# Patient Record
Sex: Male | Born: 1960
Health system: Southern US, Community
[De-identification: ages and names within clinical notes are randomized; demographics above are authoritative.]

## PROBLEM LIST (undated history)

## (undated) ENCOUNTER — Emergency Department (HOSPITAL_COMMUNITY): Discharge status: Absent without leave | Payer: Medicaid Other

## (undated) DIAGNOSIS — I10 Essential (primary) hypertension: Secondary | ICD-10-CM

## (undated) DIAGNOSIS — N189 Chronic kidney disease, unspecified: Secondary | ICD-10-CM

## (undated) DIAGNOSIS — M549 Dorsalgia, unspecified: Secondary | ICD-10-CM

## (undated) DIAGNOSIS — N186 End stage renal disease: Secondary | ICD-10-CM

## (undated) DIAGNOSIS — E119 Type 2 diabetes mellitus without complications: Secondary | ICD-10-CM

## (undated) DIAGNOSIS — T884XXA Failed or difficult intubation, initial encounter: Secondary | ICD-10-CM

## (undated) DIAGNOSIS — I2510 Atherosclerotic heart disease of native coronary artery without angina pectoris: Secondary | ICD-10-CM

## (undated) DIAGNOSIS — R509 Fever, unspecified: Secondary | ICD-10-CM

## (undated) DIAGNOSIS — H409 Unspecified glaucoma: Secondary | ICD-10-CM

## (undated) DIAGNOSIS — I639 Cerebral infarction, unspecified: Secondary | ICD-10-CM

## (undated) DIAGNOSIS — Z992 Dependence on renal dialysis: Secondary | ICD-10-CM

## (undated) HISTORY — DX: Essential (primary) hypertension: I10

## (undated) HISTORY — DX: Type 2 diabetes mellitus without complications: E11.9

## (undated) HISTORY — PX: EYE SURGERY: SHX253

---

## 2000-08-14 ENCOUNTER — Encounter: Payer: Self-pay | Admitting: Emergency Medicine

## 2000-08-14 ENCOUNTER — Emergency Department (HOSPITAL_COMMUNITY): Admission: EM | Admit: 2000-08-14 | Discharge: 2000-08-14 | Payer: Self-pay | Admitting: Emergency Medicine

## 2004-03-05 ENCOUNTER — Ambulatory Visit: Payer: Self-pay | Admitting: Family Medicine

## 2004-03-29 ENCOUNTER — Ambulatory Visit (HOSPITAL_COMMUNITY): Admission: RE | Admit: 2004-03-29 | Discharge: 2004-03-29 | Payer: Self-pay | Admitting: General Practice

## 2004-06-07 ENCOUNTER — Ambulatory Visit: Payer: Self-pay | Admitting: Family Medicine

## 2004-08-01 ENCOUNTER — Ambulatory Visit: Payer: Self-pay | Admitting: Family Medicine

## 2004-09-04 ENCOUNTER — Ambulatory Visit: Payer: Self-pay | Admitting: Family Medicine

## 2005-03-15 ENCOUNTER — Ambulatory Visit: Payer: Self-pay | Admitting: Family Medicine

## 2005-04-18 ENCOUNTER — Ambulatory Visit: Payer: Self-pay | Admitting: *Deleted

## 2005-05-01 ENCOUNTER — Ambulatory Visit: Payer: Self-pay | Admitting: Family Medicine

## 2005-06-14 ENCOUNTER — Ambulatory Visit: Payer: Self-pay | Admitting: Internal Medicine

## 2008-02-01 ENCOUNTER — Encounter (INDEPENDENT_AMBULATORY_CARE_PROVIDER_SITE_OTHER): Payer: Self-pay | Admitting: Family Medicine

## 2008-02-01 ENCOUNTER — Ambulatory Visit: Payer: Self-pay | Admitting: Internal Medicine

## 2008-02-01 LAB — CONVERTED CEMR LAB
ALT: 13 units/L (ref 0–53)
AST: 15 units/L (ref 0–37)
Albumin: 4.4 g/dL (ref 3.5–5.2)
Alkaline Phosphatase: 63 units/L (ref 39–117)
BUN: 8 mg/dL (ref 6–23)
CO2: 24 meq/L (ref 19–32)
Calcium: 9.3 mg/dL (ref 8.4–10.5)
Chloride: 104 meq/L (ref 96–112)
Cholesterol: 144 mg/dL (ref 0–200)
Creatinine, Ser: 0.88 mg/dL (ref 0.40–1.50)
Glucose, Bld: 104 mg/dL — ABNORMAL HIGH (ref 70–99)
HDL: 42 mg/dL (ref >39)
LDL Cholesterol: 84 mg/dL (ref 0–99)
Microalb, Ur: 7.03 mg/dL — ABNORMAL HIGH (ref 0.00–1.89)
PSA: 0.21 ng/mL (ref 0.10–4.00)
Potassium: 5 meq/L (ref 3.5–5.3)
Sodium: 139 meq/L (ref 135–145)
TSH: 1.141 microintl units/mL (ref 0.350–4.50)
Total Bilirubin: 0.5 mg/dL (ref 0.3–1.2)
Total CHOL/HDL Ratio: 3.4
Total Protein: 6.9 g/dL (ref 6.0–8.3)
Triglycerides: 91 mg/dL (ref <150)
VLDL: 18 mg/dL (ref 0–40)

## 2008-02-16 ENCOUNTER — Ambulatory Visit: Payer: Self-pay | Admitting: Internal Medicine

## 2008-05-23 ENCOUNTER — Ambulatory Visit: Payer: Self-pay | Admitting: Internal Medicine

## 2008-10-12 ENCOUNTER — Ambulatory Visit: Payer: Self-pay | Admitting: Internal Medicine

## 2008-10-18 ENCOUNTER — Emergency Department (HOSPITAL_COMMUNITY): Admission: EM | Admit: 2008-10-18 | Discharge: 2008-10-18 | Payer: Self-pay | Admitting: Emergency Medicine

## 2008-10-18 ENCOUNTER — Ambulatory Visit: Payer: Self-pay | Admitting: Internal Medicine

## 2010-08-27 LAB — BASIC METABOLIC PANEL
BUN: 26 mg/dL — ABNORMAL HIGH (ref 6–23)
CO2: 26 mEq/L (ref 19–32)
Calcium: 9.1 mg/dL (ref 8.4–10.5)
Chloride: 105 mEq/L (ref 96–112)
Creatinine, Ser: 1.02 mg/dL (ref 0.4–1.5)
GFR calc Af Amer: >60 mL/min (ref >60)
GFR calc non Af Amer: >60 mL/min (ref >60)
Glucose, Bld: 431 mg/dL — ABNORMAL HIGH (ref 70–99)
Potassium: 5 mEq/L (ref 3.5–5.1)
Sodium: 136 mEq/L (ref 135–145)

## 2010-08-27 LAB — GLUCOSE, CAPILLARY
Glucose-Capillary: 190 mg/dL — ABNORMAL HIGH (ref 70–99)
Glucose-Capillary: 436 mg/dL — ABNORMAL HIGH (ref 70–99)
Glucose-Capillary: 437 mg/dL — ABNORMAL HIGH (ref 70–99)

## 2010-08-27 LAB — DIFFERENTIAL
Basophils Absolute: 0 10*3/uL (ref 0.0–0.1)
Lymphocytes Relative: 32 % (ref 12–46)
Monocytes Absolute: 0.4 10*3/uL (ref 0.1–1.0)
Neutro Abs: 2.1 10*3/uL (ref 1.7–7.7)

## 2010-08-27 LAB — CBC
HCT: 32.2 % — ABNORMAL LOW (ref 39.0–52.0)
Hemoglobin: 11.5 g/dL — ABNORMAL LOW (ref 13.0–17.0)
MCHC: 35.8 g/dL (ref 30.0–36.0)
MCV: 85.6 fL (ref 78.0–100.0)
Platelets: 127 10*3/uL — ABNORMAL LOW (ref 150–400)
RBC: 3.76 MIL/uL — ABNORMAL LOW (ref 4.22–5.81)
RDW: 13 % (ref 11.5–15.5)
WBC: 3.9 10*3/uL — ABNORMAL LOW (ref 4.0–10.5)

## 2010-08-27 LAB — URINALYSIS, ROUTINE W REFLEX MICROSCOPIC
Bilirubin Urine: NEGATIVE
Leukocytes, UA: NEGATIVE
Nitrite: NEGATIVE
Specific Gravity, Urine: 1.031 — ABNORMAL HIGH (ref 1.005–1.030)
pH: 5 (ref 5.0–8.0)

## 2010-08-27 LAB — URINE MICROSCOPIC-ADD ON

## 2011-05-21 ENCOUNTER — Ambulatory Visit: Payer: Self-pay

## 2011-05-21 DIAGNOSIS — I10 Essential (primary) hypertension: Secondary | ICD-10-CM

## 2011-05-21 DIAGNOSIS — IMO0001 Reserved for inherently not codable concepts without codable children: Secondary | ICD-10-CM

## 2011-06-06 ENCOUNTER — Ambulatory Visit: Payer: Self-pay

## 2011-06-06 DIAGNOSIS — E1129 Type 2 diabetes mellitus with other diabetic kidney complication: Secondary | ICD-10-CM

## 2012-06-09 ENCOUNTER — Other Ambulatory Visit: Payer: Self-pay | Admitting: Family Medicine

## 2013-02-22 ENCOUNTER — Ambulatory Visit: Payer: Self-pay | Admitting: Internal Medicine

## 2013-02-22 VITALS — BP 162/82 | HR 67 | Temp 98.6°F | Resp 14 | Ht 69.0 in | Wt 176.4 lb

## 2013-02-22 DIAGNOSIS — I10 Essential (primary) hypertension: Secondary | ICD-10-CM

## 2013-02-22 DIAGNOSIS — E1065 Type 1 diabetes mellitus with hyperglycemia: Secondary | ICD-10-CM

## 2013-02-22 LAB — POCT URINALYSIS DIPSTICK
Bilirubin, UA: NEGATIVE
Ketones, UA: NEGATIVE
pH, UA: 5

## 2013-02-22 MED ORDER — INSULIN GLARGINE 100 UNIT/ML ~~LOC~~ SOLN: 50.0000 [IU] | Freq: Every day | SUBCUTANEOUS | Status: DC

## 2013-02-22 MED ORDER — LISINOPRIL 20 MG PO TABS: 20.0000 mg | ORAL_TABLET | Freq: Every day | ORAL | Status: DC

## 2013-02-22 MED ORDER — METFORMIN HCL 1000 MG PO TABS: 500.0000 mg | ORAL_TABLET | Freq: Two times a day (BID) | ORAL | Status: DC

## 2013-02-22 NOTE — Patient Instructions (Addendum)
Take your meds as directed. Return in one month to see dr hopper for reeval of your diabetes and hypertension control. If your symptoms worsen return to the [facility].

## 2013-02-22 NOTE — Progress Notes (Signed)
Subjective:    Patient ID: Russell Price, male    DOB: [birth year], 52 y.o.   MRN: [id number]  HPI 52 year old male from the [place] with a history of htn and diabetes, insulin dependant here for medication refill. He has been out of the country for the past 3 or 4 months returned 4 weeks ago from the [place] ([place]) Denies illness or exposure to Ebola. He ran out of his medications while he was in [place] and has been getting up to urinate several times a night.He denies weight loss fever nvd cough sob.he is here requesting a refill on his meds. He does not check his sugar.   Review of Systems  Constitutional: Negative.   HENT: Negative.   Eyes: Negative.   Respiratory: Negative.   Cardiovascular: Negative.   Gastrointestinal: Negative.   Endocrine: Negative.   Genitourinary: Negative.   Musculoskeletal: Negative.   Skin: Negative.   Allergic/Immunologic: Negative.   Neurological: Negative.   Hematological: Negative.   Psychiatric/Behavioral: Negative.   All other systems reviewed and are negative.       Objective:   Physical Exam  Nursing note and vitals reviewed. Constitutional: He is oriented to person, place, and time. He appears well-developed and well-nourished. No distress.  HENT:  Head: Normocephalic and atraumatic.  Right Ear: External ear normal.  Left Ear: External ear normal.  Nose: Nose normal.  Mouth/Throat: Oropharynx is clear and moist.  Eyes: Conjunctivae and EOM are normal. Pupils are equal, round, and reactive to light.  Neck: Normal range of motion. Neck supple.  Cardiovascular: Normal rate, regular rhythm, normal heart sounds and intact distal pulses.   Pulmonary/Chest: Effort normal and breath sounds normal.  Abdominal: Soft. Bowel sounds are normal.  Musculoskeletal: Normal range of motion.  Neurological: He is alert and oriented to person, place, and time. He has normal reflexes.  Skin: Skin is warm and dry. He is not diaphoretic.   Psychiatric: He has a normal mood and affect. His behavior is normal. Judgment and thought content normal.   bp is elevated.pt is afebrile. Skin is warm and dry  Results for orders placed in visit on 02/22/13  POCT GLYCOSYLATED HEMOGLOBIN (HGB A1C)      Result Value Range   Hemoglobin A1C 11.3    GLUCOSE, POCT (MANUAL RESULT ENTRY)      Result Value Range   POC Glucose 149 (*) 70 - 99 mg/dl  POCT URINALYSIS DIPSTICK      Result Value Range   Color, UA yellow     Clarity, UA clear     Glucose, UA neg     Bilirubin, UA neg     Ketones, UA neg     Spec Grav, UA 1.025     Blood, UA small     pH, UA 5.0     Protein, UA 300     Urobilinogen, UA 0.2     Nitrite, UA neg     Leukocytes, UA Negative     Diabetes is out of control with hgb a1c of 11.3. He has not taken his insulin in at least 2 months. Will restart meds and have him return in one month for eval with his primary care physician. Dr.Hopper/.   Johnnye Lana mild dehydration. Sugar non fasting 149 Assessment & Plan:  Pt has been noncompliant with his diabetes medications and htn meds for the past several lmonths. His hgb a1c is above 11. BP is high. Will restart lmeds and reeval in one  month.

## 2013-05-29 ENCOUNTER — Ambulatory Visit (INDEPENDENT_AMBULATORY_CARE_PROVIDER_SITE_OTHER): Payer: BC Managed Care – PPO | Admitting: Family Medicine

## 2013-05-29 VITALS — BP 128/68 | HR 52 | Temp 98.9°F | Resp 18 | Ht 69.5 in | Wt 188.0 lb

## 2013-05-29 DIAGNOSIS — E108 Type 1 diabetes mellitus with unspecified complications: Secondary | ICD-10-CM

## 2013-05-29 DIAGNOSIS — IMO0002 Reserved for concepts with insufficient information to code with codable children: Secondary | ICD-10-CM

## 2013-05-29 DIAGNOSIS — E119 Type 2 diabetes mellitus without complications: Secondary | ICD-10-CM

## 2013-05-29 DIAGNOSIS — H5440 Blindness, one eye, unspecified eye: Secondary | ICD-10-CM

## 2013-05-29 DIAGNOSIS — E1065 Type 1 diabetes mellitus with hyperglycemia: Secondary | ICD-10-CM

## 2013-05-29 LAB — COMPREHENSIVE METABOLIC PANEL
ALT: 12 U/L (ref 0–53)
AST: 12 U/L (ref 0–37)
Albumin: 4.1 g/dL (ref 3.5–5.2)
Alkaline Phosphatase: 93 U/L (ref 39–117)
BUN: 36 mg/dL — ABNORMAL HIGH (ref 6–23)
CO2: 27 mEq/L (ref 19–32)
Calcium: 9.5 mg/dL (ref 8.4–10.5)
Chloride: 103 mEq/L (ref 96–112)
Creat: 2.13 mg/dL — ABNORMAL HIGH (ref 0.50–1.35)
Glucose, Bld: 211 mg/dL — ABNORMAL HIGH (ref 70–99)
Potassium: 4.6 mEq/L (ref 3.5–5.3)
Sodium: 137 mEq/L (ref 135–145)
Total Bilirubin: 0.3 mg/dL (ref 0.3–1.2)
Total Protein: 7.3 g/dL (ref 6.0–8.3)

## 2013-05-29 LAB — POCT GLYCOSYLATED HEMOGLOBIN (HGB A1C): Hemoglobin A1C: 8.1

## 2013-05-29 LAB — GLUCOSE, POCT (MANUAL RESULT ENTRY): POC Glucose: 227 mg/dl — AB (ref 70–99)

## 2013-05-29 LAB — MICROALBUMIN, URINE: Microalb, Ur: 104.12 mg/dL — ABNORMAL HIGH (ref 0.00–1.89)

## 2013-05-29 MED ORDER — INSULIN GLARGINE 100 UNIT/ML ~~LOC~~ SOLN: 50.0000 [IU] | Freq: Every day | SUBCUTANEOUS | Status: DC

## 2013-05-29 MED ORDER — METFORMIN HCL 1000 MG PO TABS
500.0000 mg | ORAL_TABLET | Freq: Two times a day (BID) | ORAL | Status: DC
Start: 2013-05-29 — End: 2013-09-14

## 2013-05-29 MED ORDER — LISINOPRIL 20 MG PO TABS: 20.0000 mg | ORAL_TABLET | Freq: Every day | ORAL | Status: DC

## 2013-05-29 NOTE — Progress Notes (Signed)
Patient ID: Russell Price MRN: [id number], DOB: 26-Sep-1960, 53 y.o. Date of Encounter: 05/29/2013, 2:32 PM  Primary Physician: No PCP Per Patient  Chief Complaint: Diabetes follow up  HPI: 53 y.o. year old male  From [place] with history below presents for follow up of diabetes mellitus since 1996. Doing well. No issues or complaints. Taking medications daily without adverse effects. No polydipsia, polyphagia, polyuria, or nocturia.  Married, no children, works at [employer] Blood sugars at home:  Not checked Diet consists of:  Food from [place] Exercising regularly.  At work at [employer] A1C: 11.3  Eye MD:  Several years  Pneumococcal vaccine:  Has rec'd one vaccine   Past Medical History  Diagnosis Date  . Diabetes mellitus without complication   . Hypertension      Home Meds: Prior to Admission medications   Medication Sig Start Date End Date Taking? Authorizing Provider  insulin glargine (LANTUS) 100 UNIT/ML injection Inject 0.5 mLs (50 Units total) into the skin at bedtime. 02/22/13  Yes Boris Lown, MD  lisinopril (PRINIVIL,ZESTRIL) 20 MG tablet Take 1 tablet (20 mg total) by mouth daily. 02/22/13  Yes Boris Lown, MD  metFORMIN (GLUCOPHAGE) 1000 MG tablet Take 0.5 tablets (500 mg total) by mouth 2 (two) times daily. 02/22/13  Yes Boris Lown, MD    Allergies: No Known Allergies  History   Social History  . Marital Status: Single    Spouse Name: N/A    Number of Children: N/A  . Years of Education: N/A   Occupational History  . Not on file.   Social History Main Topics  . Smoking status: Former Smoker    Quit date: 02/23/1983  . Smokeless tobacco: Not on file  . Alcohol Use: No  . Drug Use: No  . Sexual Activity: Not on file   Other Topics Concern  . Not on file   Social History Narrative  . No narrative on file     Review of Systems: Constitutional: negative for chills, fever, night sweats, weight changes, or fatigue  HEENT: negative  for vision changes, hearing loss, congestion, rhinorrhea, or epistaxis Cardiovascular: negative for chest pain, palpitations, diaphoresis, DOE, orthopnea, or edema Respiratory: negative for hemoptysis, wheezing, shortness of breath, dyspnea, or cough Abdominal: negative for abdominal pain, nausea, vomiting, diarrhea, or constipation Dermatological: negative for rash, erythema, or wounds Neurologic: negative for headache, dizziness, or syncope Renal:  Negative for polyuria, polydipsia, or dysuria All other systems reviewed and are otherwise negative with the exception to those above and in the HPI.   Physical Exam: Blood pressure 128/68, pulse 52, temperature 98.9 F (37.2 C), temperature source Oral, resp. rate 18, height 5' 9.5" (1.765 m), weight 188 lb (85.276 kg), SpO2 100.00%., Body mass index is 27.37 kg/(m^2). General: Well developed, well nourished, in no acute distress. Head: Normocephalic,eyes without discharge, sclera non-icteric, nares are without discharge. Bilateral auditory canals clear, TM's are without perforation, pearly grey and translucent with reflective cone of light bilaterally. Oral cavity moist, posterior pharynx without exudate, erythema, peritonsillar abscess, or post nasal drip. No red reflex right, normal retina left  Neck: Supple. No thyromegaly. Full ROM. No lymphadenopathy. Lungs: Clear bilaterally to auscultation without wheezes, rales, or rhonchi. Breathing is unlabored. Heart: RRR with S1 S2. No murmurs, rubs, or gallops appreciated. Abdomen: Soft, non-tender, non-distended with normoactive bowel sounds. No hepatosplenomegaly. No rebound/guarding. No obvious abdominal masses. Msk:  Strength and tone normal for age. Extremities/Skin: Warm and dry. No clubbing or  cyanosis. No edema. No rashes, wounds, or suspicious lesions. Monofilament exam normal.  Neuro: Alert and oriented X 3. Moves all extremities spontaneously. Gait is normal. CNII-XII grossly in  tact. Psych:  Responds to questions appropriately with a normal affect.   Labs: Results for orders placed in visit on 05/29/13  POCT GLYCOSYLATED HEMOGLOBIN (HGB A1C)      Result Value Range   Hemoglobin A1C 8.1    GLUCOSE, POCT (MANUAL RESULT ENTRY)      Result Value Range   POC Glucose 227 (*) 70 - 99 mg/dl      ASSESSMENT AND PLAN:  53 y.o. 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 Type II or unspecified type diabetes mellitus without mention of complication, not stated as uncontrolled - Plan: POCT glycosylated hemoglobin (Hb A1C), POCT glucose (manual entry), Comprehensive metabolic panel, Microalbumin, urine, Ambulatory referral to Ophthalmology  Blind right eye - Plan: Ambulatory referral to Ophthalmology  Type I (juvenile type) diabetes mellitus with unspecified complication, uncontrolled - Plan: Ambulatory referral to Ophthalmology, metFORMIN (GLUCOPHAGE) 1000 MG tablet, lisinopril (PRINIVIL,ZESTRIL) 20 MG tablet, insulin glargine (LANTUS) 100 UNIT/ML injection   -  Signed, Robyn Haber, MD 05/29/2013 2:32 PM

## 2013-05-30 ENCOUNTER — Other Ambulatory Visit: Payer: Self-pay | Admitting: Family Medicine

## 2013-05-30 DIAGNOSIS — E1121 Type 2 diabetes mellitus with diabetic nephropathy: Secondary | ICD-10-CM

## 2013-09-12 ENCOUNTER — Ambulatory Visit (INDEPENDENT_AMBULATORY_CARE_PROVIDER_SITE_OTHER): Payer: BC Managed Care – PPO | Admitting: Family Medicine

## 2013-09-12 ENCOUNTER — Telehealth: Payer: Self-pay | Admitting: Family Medicine

## 2013-09-12 VITALS — BP 128/60 | HR 60 | Temp 98.0°F | Resp 16 | Ht 69.0 in | Wt 176.8 lb

## 2013-09-12 DIAGNOSIS — IMO0002 Reserved for concepts with insufficient information to code with codable children: Secondary | ICD-10-CM

## 2013-09-12 DIAGNOSIS — E119 Type 2 diabetes mellitus without complications: Secondary | ICD-10-CM

## 2013-09-12 DIAGNOSIS — I10 Essential (primary) hypertension: Secondary | ICD-10-CM

## 2013-09-12 DIAGNOSIS — N289 Disorder of kidney and ureter, unspecified: Secondary | ICD-10-CM

## 2013-09-12 DIAGNOSIS — E118 Type 2 diabetes mellitus with unspecified complications: Principal | ICD-10-CM

## 2013-09-12 DIAGNOSIS — E1165 Type 2 diabetes mellitus with hyperglycemia: Secondary | ICD-10-CM

## 2013-09-12 LAB — COMPREHENSIVE METABOLIC PANEL
AST: 10 U/L (ref 0–37)
BUN: 39 mg/dL — ABNORMAL HIGH (ref 6–23)
Calcium: 8.9 mg/dL (ref 8.4–10.5)
Chloride: 103 mEq/L (ref 96–112)
Creat: 2.43 mg/dL — ABNORMAL HIGH (ref 0.50–1.35)
Glucose, Bld: 274 mg/dL — ABNORMAL HIGH (ref 70–99)

## 2013-09-12 LAB — COMPREHENSIVE METABOLIC PANEL WITH GFR
ALT: 9 U/L (ref 0–53)
Albumin: 3.8 g/dL (ref 3.5–5.2)
Alkaline Phosphatase: 90 U/L (ref 39–117)
CO2: 23 meq/L (ref 19–32)
Potassium: 5.2 meq/L (ref 3.5–5.3)
Sodium: 134 meq/L — ABNORMAL LOW (ref 135–145)
Total Bilirubin: 0.4 mg/dL (ref 0.2–1.2)
Total Protein: 7 g/dL (ref 6.0–8.3)

## 2013-09-12 LAB — MICROALBUMIN, URINE: Microalb, Ur: 94.89 mg/dL — ABNORMAL HIGH (ref 0.00–1.89)

## 2013-09-12 LAB — GLUCOSE, POCT (MANUAL RESULT ENTRY): POC Glucose: 280 mg/dl — AB (ref 70–99)

## 2013-09-12 LAB — POCT GLYCOSYLATED HEMOGLOBIN (HGB A1C): Hemoglobin A1C: 8.9

## 2013-09-12 MED ORDER — INSULIN GLARGINE 100 UNIT/ML ~~LOC~~ SOLN: 50.0000 [IU] | Freq: Every day | SUBCUTANEOUS | Status: DC

## 2013-09-12 NOTE — Telephone Encounter (Signed)
LM to call me so we can change medications and talk about his labs

## 2013-09-12 NOTE — Patient Instructions (Signed)
Diabetes and Standards of Medical Care  Diabetes is complicated. You may find that your diabetes team includes a dietitian, nurse, diabetes educator, eye doctor, and more. To help everyone know what is going on and to help you get the care you deserve, the following schedule of care was developed to help keep you on track. Below are the tests, exams, vaccines, medicines, education, and plans you will need. HbA1c test This test shows how well you have controlled your glucose over the past 2 3 months. It is used to see if your diabetes management plan needs to be adjusted.   It is performed at least 2 times a year if you are meeting treatment goals.  It is performed 4 times a year if therapy has changed or if you are not meeting treatment goals. Blood pressure test  This test is performed at every routine medical visit. The goal is less than 140/90 mmHg for most people, but 130/80 mmHg in some cases. Ask your health care provider about your goal. Dental exam  Follow up with the dentist regularly. Eye exam  If you are diagnosed with type 1 diabetes as a child, get an exam upon reaching the age of 42 years or older and have had diabetes for 3 5 years. Yearly eye exams are recommended after that initial eye exam.  If you are diagnosed with type 1 diabetes as an adult, get an exam within 5 years of diagnosis and then yearly.  If you are diagnosed with type 2 diabetes, get an exam as soon as possible after the diagnosis and then yearly. Foot care exam  Visual foot exams are performed at every routine medical visit. The exams check for cuts, injuries, or other problems with the feet.  A comprehensive foot exam should be done yearly. This includes visual inspection as well as assessing foot pulses and testing for loss of sensation.  Check your feet nightly for cuts, injuries, or other problems with your feet. Tell your health care provider if anything is not healing. Kidney function test (urine  microalbumin)  This test is performed once a year.  Type 1 diabetes: The first test is performed 5 years after diagnosis.  Type 2 diabetes: The first test is performed at the time of diagnosis.  A serum creatinine and estimated glomerular filtration rate (eGFR) test is done once a year to assess the level of chronic kidney disease (CKD), if present. Lipid profile (cholesterol, HDL, LDL, triglycerides)  Performed every 5 years for most people.  The goal for LDL is less than 100 mg/dL. If you are at high risk, the goal is less than 70 mg/dL.  The goal for HDL is 40 mg/dL 50 mg/dL for men and 50 mg/dL 60 mg/dL for women. An HDL cholesterol of 60 mg/dL or higher gives some protection against heart disease.  The goal for triglycerides is less than 150 mg/dL. Influenza vaccine, pneumococcal vaccine, and hepatitis B vaccine  The influenza vaccine is recommended yearly.  The pneumococcal vaccine is generally given once in a lifetime. However, there are some instances when another vaccination is recommended. Check with your health care provider.  The hepatitis B vaccine is also recommended for adults with diabetes. Diabetes self-management education  Education is recommended at diagnosis and ongoing as needed. Treatment plan  Your treatment plan is reviewed at every medical visit. Document Released: 03/03/2009 Document Revised: 01/06/2013 Document Reviewed: 10/06/2012 [facility] Patient Information 2014 [place]. Diabetes and Small Vessel Disease Small vessel disease (microvascular  disease) includes nephropathy, retinopathy, and neuropathy. People with diabetes are at risk for these problems, but keeping blood glucose (sugar) controlled is helpful in preventing problems. DIABETIC KIDNEY PROBLEMS (DIABETIC NEPHROPATHY)  Diabetic nephropathy occurs in many patients with diabetes.  Damage to the small vessels in the kidneys is the leading cause of end-stage renal disease  (ESRD).  Protein in the urine (albuminuria) in the range of 30 to 300 mg/24 h (microalbuminuria) is a sign of the earliest stage of diabetic nephropathy.  Good blood glucose (sugar) and blood pressure control significantly reduce the progression of nephropathy. DIABETIC EYE PROBLEMS (DIABETIC RETINOPATHY)  Diabetic retinopathy is the most common cause of new cases of blindness in adults. It is related to the number of years you have had diabetes.  Common risk factors include high blood sugar (hyperglycemia), high blood pressure (hypertension), and poorly controlled blood lipids such as high blood cholesterol (hypercholesterolemia). DIABETIC NERVE PROBLEMS (DIABETIC NEUROPATHY) Diabetic neuropathy is the most common, long-term complication of diabetes. It is responsible for more than half of leg amputations not due to accidents. The main risk for developing diabetic neuropathy seems to be uncontrolled blood sugars. Hyperglycemia damages the nerve fibers causing sensation (feeling) problems. The closer you can keep the following guidelines, the better chance you will have avoiding problems from small vessel disease.  Working toward near normal blood glucose or as normal as possible. You will need to keep your blood glucose and A1c at the target range prescribed by your caregiver.  Keep your blood pressure less than 120/80.  Keep your low-density lipoprotein (LDL) cholesterol (one of the fats in your blood) at less than 100 mg/dL. An LDL less than 70 mg/dL may be recommended for high risk patients. You cannot change your family history, but it is important to change the risk factors that you can. Risk factors you can control include:  Controlling high blood pressure.  Stopping smoking.  Using alcohol only in moderation. Generally, this means about one drink per day for women and two drinks per day for men.  Controlling your blood lipids (cholesterol and triglycerides).  Treating heart  problems, if these are contributing to risk. SEEK MEDICAL CARE IF:   You are having problems keeping your blood glucose in goal range.  You notice a change in your vision or new problems with your vision.  You have wound or sore that does not heal.  Your blood pressure is above the target range. Document Released: 05/09/2003 Document Revised: 04/22/2012 Document Reviewed: 10/14/2008 [facility] Patient Information 2014 [place], [place].

## 2013-09-12 NOTE — Progress Notes (Signed)
Chief Complaint:  Chief Complaint  Patient presents with  . Medication Refill    bp, and sugar    HPI: Russell Price is a 53 y.o. male who is here for  Medication refills for DM and HTN He was last here and HbA1c in 05/2013 and A1c was 8.1 He was previously seen in 02/2013 and A1c was 11.3 Diabetes-dx 1996, few episodes of hypoglycemia, last year was the last time.  He knows the hypglycemia sxs. Denies neuropathy .Last eye exam was March 2014, he has had laser suregery for diabetic retinal issues from what it sounds like He used to work at [employer], He now works at [employer] , starts work at [employer]. He has not changed diet .  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    Past Medical History  Diagnosis Date  . Diabetes mellitus without complication   . Hypertension    History reviewed. No pertinent past surgical history. History   Social History  . Marital Status: Single    Spouse Name: N/A    Number of Children: N/A  . Years of Education: N/A   Social History Main Topics  . Smoking status: Former Smoker    Quit date: 02/23/1983  . Smokeless tobacco: None  . Alcohol Use: No  . Drug Use: No  . Sexual Activity: None   Other Topics Concern  . None   Social History Narrative  . None   Family History  Problem Relation Age of Onset  . Diabetes Mother    No Known Allergies Prior to Admission medications   Medication Sig Start Date End Date Taking? Authorizing Provider  insulin glargine (LANTUS) 100 UNIT/ML injection Inject 0.5 mLs (50 Units total) into the skin at bedtime.  05/29/13  Yes Robyn Haber, MD  lisinopril (PRINIVIL,ZESTRIL) 20 MG tablet Take 1 tablet (20 mg total) by mouth daily. 05/29/13  Yes Robyn Haber, MD  metFORMIN (GLUCOPHAGE) 1000 MG tablet Take 0.5 tablets (500 mg total) by mouth 2 (two) times daily. 05/29/13  Yes Robyn Haber, MD     ROS: The patient denies fevers, chills, night sweats, unintentional weight loss, chest pain, palpitations, wheezing, dyspnea on exertion, nausea, vomiting, abdominal pain, dysuria, hematuria, melena, numbness, weakness, or tingling.   All other systems have been reviewed and were otherwise negative with the exception of those mentioned in the HPI and as above.    PHYSICAL EXAM: Filed Vitals:   09/12/13 1113  BP: 128/60  Pulse: 60  Temp: 98 F (36.7 C)  Resp: 16   Filed Vitals:   09/12/13 1113  Height: 5\' 9"  (1.753 m)  Weight: 176 lb 12.8 oz (80.196 kg)   Body mass index is 26.1 kg/(m^2).  General: Alert, no acute distress HEENT:  Normocephalic, atraumatic, oropharynx patent. EOMI, PERRLA Cardiovascular:  Regular rate and rhythm, no rubs murmurs or gallops.  No Carotid bruits, radial pulse intact. No pedal edema.  Respiratory: Clear to auscultation bilaterally.  No wheezes, rales, or rhonchi.  No cyanosis, no use of accessory  musculature GI: No organomegaly, abdomen is soft and non-tender, positive bowel sounds.  No masses. Skin: No rashes. Neurologic: Facial musculature symmetric. Psychiatric: Patient is appropriate throughout our interaction. Lymphatic: No cervical lymphadenopathy Musculoskeletal: Gait intact.   LABS: Results for orders placed in visit on 09/12/13  GLUCOSE, POCT (MANUAL RESULT ENTRY)      Result Value Ref Range   POC Glucose 280 (*) 70 - 99 mg/dl  POCT GLYCOSYLATED HEMOGLOBIN (HGB A1C)      Result Value Ref Range   Hemoglobin A1C 8.9       EKG/XRAY:   Primary read interpreted by Dr. Marin Comment at [facility].   ASSESSMENT/PLAN: Encounter Diagnoses  Name Primary?  . DM  (diabetes mellitus) Yes  . HTN (hypertension)   . Kidney function abnormal    I wil call patient with new lab results If kidney function is stable then will continue  with current meds otherwise will change metformin and lisinopril to decrease renal damage He will make appt with renal specialist He will be referred to endocrinology since he is noncompliant Fu in the morning with labs and any new medicine changes, call him before he goes to work at 2:30 PM  Gross sideeffects, risk and benefits, and alternatives of medications d/w patient. Patient is aware that all medications have potential sideeffects and we are unable to predict every sideeffect or drug-drug interaction that may occur.  [name], DO 09/12/2013 12:16 PM  4/26 and 4/27 LM to call me to change meds until can be seen by endocrinology

## 2013-09-13 ENCOUNTER — Telehealth: Payer: Self-pay | Admitting: Family Medicine

## 2013-09-13 NOTE — Telephone Encounter (Signed)
LM to all me back, will refer to [place].

## 2013-09-14 ENCOUNTER — Telehealth: Payer: Self-pay | Admitting: Family Medicine

## 2013-09-14 MED ORDER — FREESTYLE SYSTEM KIT: PACK | Status: DC

## 2013-09-14 MED ORDER — GLIPIZIDE 5 MG PO TABS: 5.0000 mg | ORAL_TABLET | Freq: Every day | ORAL | Status: DC

## 2013-09-14 MED ORDER — AMLODIPINE BESYLATE 10 MG PO TABS: 10.0000 mg | ORAL_TABLET | Freq: Every day | ORAL | Status: DC

## 2013-09-14 NOTE — Telephone Encounter (Signed)
Spoke to him about labs and need to change his meds from metformin to glipizide 5 daily and increase his lantus to 55 units daily, he will stop lisinopril and will be switched to norvasc due to increase creatinine.  He was given hypoglycemia precautions with glipizide when taken without food, also extremity edema with CCB, this is temporary until he gets in to see endocrinology ad also renal specialist. I have emphasized to him my concerns, he needs to also get glucose monitor to measure FBS.

## 2014-01-03 ENCOUNTER — Ambulatory Visit (INDEPENDENT_AMBULATORY_CARE_PROVIDER_SITE_OTHER): Payer: BC Managed Care – PPO | Admitting: Family Medicine

## 2014-01-03 ENCOUNTER — Ambulatory Visit (INDEPENDENT_AMBULATORY_CARE_PROVIDER_SITE_OTHER): Payer: BC Managed Care – PPO

## 2014-01-03 VITALS — BP 158/74 | HR 69 | Temp 98.2°F | Resp 18 | Ht 70.0 in | Wt 174.0 lb

## 2014-01-03 DIAGNOSIS — N184 Chronic kidney disease, stage 4 (severe): Secondary | ICD-10-CM

## 2014-01-03 DIAGNOSIS — R059 Cough, unspecified: Secondary | ICD-10-CM

## 2014-01-03 DIAGNOSIS — R05 Cough: Secondary | ICD-10-CM

## 2014-01-03 DIAGNOSIS — I10 Essential (primary) hypertension: Secondary | ICD-10-CM

## 2014-01-03 DIAGNOSIS — J209 Acute bronchitis, unspecified: Secondary | ICD-10-CM

## 2014-01-03 DIAGNOSIS — E118 Type 2 diabetes mellitus with unspecified complications: Secondary | ICD-10-CM

## 2014-01-03 MED ORDER — HYDROCODONE-HOMATROPINE 5-1.5 MG/5ML PO SYRP: 5.0000 mL | ORAL_SOLUTION | Freq: Every evening | ORAL | Status: DC | PRN

## 2014-01-03 MED ORDER — AMOXICILLIN 500 MG PO CAPS: 500.0000 mg | ORAL_CAPSULE | Freq: Two times a day (BID) | ORAL | Status: DC

## 2014-01-03 MED ORDER — BENZONATATE 100 MG PO CAPS: 100.0000 mg | ORAL_CAPSULE | Freq: Three times a day (TID) | ORAL | Status: DC | PRN

## 2014-01-03 NOTE — Patient Instructions (Signed)

## 2014-01-03 NOTE — Progress Notes (Signed)
Chief Complaint:  Chief Complaint  Patient presents with  . Cough    x 1 week    HPI: Russell Price is a 53 y.o. male who is here for  Cough for the last 1 week He feels warm and some hotness, but no overt fevers, no ear or facial pain, he feels congestion in his lungs He is coughing  And producing sputum, green in production No hemotysis,  he has not traveled anywhere recently , he has not gone out of the country for 15 years He has been to see Dr Chalmers Cater for his DM, he has also been to see his Kidney doctor--last visit was 2 weeks ago He has CKD stage 4, no new medicine changes have been done since I last saw him. He states they are monitoring everything right now and will adjust his medicines when they see him next.   Past Medical History  Diagnosis Date  . Diabetes mellitus without complication     followed by Dr Chalmers Cater  . Hypertension     followed by [facility]   History reviewed. No pertinent past surgical history. History   Social History  . Marital Status: Single    Spouse Name: N/A    Number of Children: N/A  . Years of Education: N/A   Social History Main Topics  . Smoking status: Former Smoker    Quit date: 02/23/1983  . Smokeless tobacco: None  . Alcohol Use: No  . Drug Use: No  . Sexual Activity: None   Other Topics Concern  . None   Social History Narrative  . None   Family History  Problem Relation Age of Onset  . Diabetes Mother    No Known Allergies Prior to Admission medications   Medication Sig Start Date End Date Taking? Authorizing Provider  amLODipine (NORVASC) 10 MG tablet Take 1 tablet (10 mg total) by mouth daily. Stop lisinopril . 09/14/13  Yes Tattiana Fakhouri P Caroly Purewal, DO  glipiZIDE (GLUCOTROL) 5 MG tablet Take 1 tablet (5 mg total) by mouth daily before breakfast. Monitor for low sugar levels 09/14/13  Yes Jakob Kimberlin P Refugio Vandevoorde, DO  glucose monitoring kit (FREESTYLE) monitoring kit Use as directed, take fasting blood sugar daily or prn  09/14/13  Yes Alfie Rideaux P Tovah Slavick, DO  insulin glargine (LANTUS) 100 UNIT/ML injection Inject 0.5 mLs (50 Units total) into the skin at bedtime. 09/12/13  Yes Charlayne Vultaggio P Valera Vallas, DO     ROS: The patient denies fevers, chills, night sweats, unintentional weight loss, chest pain, palpitations, wheezing, dyspnea on exertion, nausea, vomiting, abdominal pain, dysuria, hematuria, melena, numbness, weakness, or tingling.   All other systems have been reviewed and were otherwise negative with the exception of those mentioned in the HPI and as above.    PHYSICAL EXAM: Filed Vitals:   01/03/14 1300  BP: 158/74  Pulse: 69  Temp: 98.2 F (36.8 C)  Resp: 18   Filed Vitals:   01/03/14 1300  Height: '5\' 10"'  (1.778 m)  Weight: 174 lb (78.926 kg)   Body mass index is 24.97 kg/(m^2).  General: Alert, no acute distress HEENT:  Normocephalic, atraumatic, oropharynx patent. EOMI, PERRLA, TM normal, non tender,  Cardiovascular:  Regular rate and rhythm, no rubs murmurs or gallops.  No Carotid bruits, radial pulse intact. No pedal edema.  Respiratory: Clear to auscultation bilaterally.  No wheezes, rales, or rhonchi.  No cyanosis, no use of accessory musculature GI: No organomegaly, abdomen is soft and non-tender,  positive bowel sounds.  No masses. Skin: No rashes. Neurologic: Facial musculature symmetric. Psychiatric: Patient is appropriate throughout our interaction. Lymphatic: No cervical lymphadenopathy Musculoskeletal: Gait intact.   LABS: Results for orders placed in visit on 09/12/13  COMPREHENSIVE METABOLIC PANEL      Result Value Ref Range   Sodium 134 (*) 135 - 145 mEq/L   Potassium 5.2  3.5 - 5.3 mEq/L   Chloride 103  96 - 112 mEq/L   CO2 23  19 - 32 mEq/L   Glucose, Bld 274 (*) 70 - 99 mg/dL   BUN 39 (*) 6 - 23 mg/dL   Creat 2.43 (*) 0.50 - 1.35 mg/dL   Total Bilirubin 0.4  0.2 - 1.2 mg/dL   Alkaline Phosphatase 90  39 - 117 U/L   AST 10  0 - 37 U/L   ALT 9  0 - 53 U/L   Total Protein 7.0  6.0 -  8.3 g/dL   Albumin 3.8  3.5 - 5.2 g/dL   Calcium 8.9  8.4 - 10.5 mg/dL  MICROALBUMIN, URINE      Result Value Ref Range   Microalb, Ur 94.89 (*) 0.00 - 1.89 mg/dL  GLUCOSE, POCT (MANUAL RESULT ENTRY)      Result Value Ref Range   POC Glucose 280 (*) 70 - 99 mg/dl  POCT GLYCOSYLATED HEMOGLOBIN (HGB A1C)      Result Value Ref Range   Hemoglobin A1C 8.9       EKG/XRAY:   Primary read interpreted by [facility]. Marin Comment at [facility]. RUL streaky infiltrate vs increase vascular markings    ASSESSMENT/PLAN: Encounter Diagnoses  Name Primary?  . Cough   . Acute bronchitis, unspecified organism Yes  . Essential hypertension   . DM (diabetes mellitus) with complications   . CKD (chronic kidney disease) stage 4, GFR 15-29 ml/min     Bronchitis Rx Amoxacillin renal dosing 500 mg BID x 7 days Hycodan and also tessalon perles   Gross sideeffects, risk and benefits, and alternatives of medications d/w patient. Patient is aware that all medications have potential sideeffects and we are unable to predict every sideeffect or drug-drug interaction that may occur.  Tanganyika Bowlds, [place], DO 01/03/2014 2:37 PM

## 2014-09-26 ENCOUNTER — Other Ambulatory Visit: Payer: Self-pay | Admitting: Family Medicine

## 2014-09-28 ENCOUNTER — Other Ambulatory Visit: Payer: Self-pay | Admitting: Family Medicine

## 2014-11-09 ENCOUNTER — Ambulatory Visit (INDEPENDENT_AMBULATORY_CARE_PROVIDER_SITE_OTHER): Payer: BLUE CROSS/BLUE SHIELD | Admitting: Family Medicine

## 2014-11-09 VITALS — BP 120/80 | HR 67 | Temp 98.6°F | Resp 16 | Ht 70.0 in | Wt 179.0 lb

## 2014-11-09 DIAGNOSIS — N184 Chronic kidney disease, stage 4 (severe): Secondary | ICD-10-CM | POA: Diagnosis not present

## 2014-11-09 DIAGNOSIS — H5441 Blindness, right eye, normal vision left eye: Secondary | ICD-10-CM | POA: Diagnosis not present

## 2014-11-09 DIAGNOSIS — Z9119 Patient's noncompliance with other medical treatment and regimen: Secondary | ICD-10-CM | POA: Diagnosis not present

## 2014-11-09 DIAGNOSIS — Z91199 Patient's noncompliance with other medical treatment and regimen due to unspecified reason: Secondary | ICD-10-CM

## 2014-11-09 DIAGNOSIS — I10 Essential (primary) hypertension: Secondary | ICD-10-CM

## 2014-11-09 DIAGNOSIS — Z13220 Encounter for screening for lipoid disorders: Secondary | ICD-10-CM

## 2014-11-09 DIAGNOSIS — E118 Type 2 diabetes mellitus with unspecified complications: Secondary | ICD-10-CM

## 2014-11-09 DIAGNOSIS — H5440 Blindness, one eye, unspecified eye: Secondary | ICD-10-CM

## 2014-11-09 LAB — POCT GLYCOSYLATED HEMOGLOBIN (HGB A1C): Hemoglobin A1C: 10.3

## 2014-11-09 MED ORDER — INSULIN GLARGINE 100 UNIT/ML SOLOSTAR PEN: PEN_INJECTOR | SUBCUTANEOUS | Status: DC

## 2014-11-09 MED ORDER — GLIPIZIDE 5 MG PO TABS: 5.0000 mg | ORAL_TABLET | Freq: Every day | ORAL | Status: DC

## 2014-11-09 MED ORDER — AMLODIPINE BESYLATE 10 MG PO TABS: 10.0000 mg | ORAL_TABLET | Freq: Every day | ORAL | Status: DC

## 2014-11-09 NOTE — Patient Instructions (Signed)
Diabetes and Standards of Medical Care Diabetes is complicated. You may find that your diabetes team includes a dietitian, nurse, diabetes educator, eye doctor, and more. To help everyone know what is going on and to help you get the care you deserve, the following schedule of care was developed to help keep you on track. Below are the tests, exams, vaccines, medicines, education, and plans you will need. HbA1c test This test shows how well you have controlled your glucose over the past 2-3 months. It is used to see if your diabetes management plan needs to be adjusted.   It is performed at least 2 times a year if you are meeting treatment goals.  It is performed 4 times a year if therapy has changed or if you are not meeting treatment goals. Blood pressure test  This test is performed at every routine medical visit. The goal is less than 140/90 mm Hg for most people, but 130/80 mm Hg in some cases. Ask your health care provider about your goal. Dental exam  Follow up with the dentist regularly. Eye exam  If you are diagnosed with type 1 diabetes as a child, get an exam upon reaching the age of 37 years or older and have had diabetes for 3-5 years. Yearly eye exams are recommended after that initial eye exam.  If you are diagnosed with type 1 diabetes as an adult, get an exam within 5 years of diagnosis and then yearly.  If you are diagnosed with type 2 diabetes, get an exam as soon as possible after the diagnosis and then yearly. Foot care exam  Visual foot exams are performed at every routine medical visit. The exams check for cuts, injuries, or other problems with the feet.  A comprehensive foot exam should be done yearly. This includes visual inspection as well as assessing foot pulses and testing for loss of sensation.  Check your feet nightly for cuts, injuries, or other problems with your feet. Tell your health care provider if anything is not healing. Kidney function test (urine  microalbumin)  This test is performed once a year.  Type 1 diabetes: The first test is performed 5 years after diagnosis.  Type 2 diabetes: The first test is performed at the time of diagnosis.  A serum creatinine and estimated glomerular filtration rate (eGFR) test is done once a year to assess the level of chronic kidney disease (CKD), if present. Lipid profile (cholesterol, HDL, LDL, triglycerides)  Performed every 5 years for most people.  The goal for LDL is less than 100 mg/dL. If you are at high risk, the goal is less than 70 mg/dL.  The goal for HDL is 40 mg/dL-50 mg/dL for men and 50 mg/dL-60 mg/dL for women. An HDL cholesterol of 60 mg/dL or higher gives some protection against heart disease.  The goal for triglycerides is less than 150 mg/dL. Influenza vaccine, pneumococcal vaccine, and hepatitis B vaccine  The influenza vaccine is recommended yearly.  It is recommended that people with diabetes who are over 24 years old get the pneumonia vaccine. In some cases, two separate shots may be given. Ask your health care provider if your pneumonia vaccination is up to date.  The hepatitis B vaccine is also recommended for adults with diabetes. Diabetes self-management education  Education is recommended at diagnosis and ongoing as needed. Treatment plan  Your treatment plan is reviewed at every medical visit. Document Released: 03/03/2009 Document Revised: 09/20/2013 Document Reviewed: 10/06/2012 [facility] Patient Information 2015 [place],  [employer]. This information is not intended to replace advice given to you by your health care provider. Make sure you discuss any questions you have with your health care provider.  

## 2014-11-09 NOTE — Progress Notes (Signed)
Chief Complaint:  Chief Complaint  Patient presents with  . Follow-up  . Diabetes    HPI: Russell Price is a 54 y.o. male who is here for diabetes follow-up : He has stopped taking the norvasc and also the glucotrol, he states he has been feeling fine. Denies neuropathy, CP, SOB, hypoglycemia, palpitations, presyncope/syncope, polydipsia, polyuria, nausea, vomiting, abd pain HE is taking on the lantus 55 units but right now he reduced it to 30 because it it Ram[place]  He has been off the pills for 3-4 months.   No new sxs , no CP.  He has not gone to get his eyes examined,  He has not been back to the kidney specialist He has not been measurung his sugar He has not had low sugars except , he felt sweaty x1  but di dnot measure his sugar, it was 3-4 month. He was playing soccer. He ate something and felt better.  Past Medical History  Diagnosis Date  . Diabetes mellitus without complication     followed by Dr Chalmers Cater  . Hypertension     followed by [facility]   History reviewed. No pertinent past surgical history. History   Social History  . Marital Status: Single    Spouse Name: N/A  . Number of Children: N/A  . Years of Education: N/A   Social History Main Topics  . Smoking status: Former Smoker    Quit date: 02/23/1983  . Smokeless tobacco: Not on file  . Alcohol Use: No  . Drug Use: No  . Sexual Activity: Not on file   Other Topics Concern  . None   Social History Narrative   Family History  Problem Relation Age of Onset  . Diabetes Mother    No Known Allergies Prior to Admission medications   Medication Sig Start Date End Date Taking? Authorizing Provider  glucose monitoring kit (FREESTYLE) monitoring kit Use as directed, take fasting blood sugar daily or prn 09/14/13  Yes Jaiden Dinkins P Latise Dilley, DO  Insulin Glargine (LANTUS SOLOSTAR) 100 UNIT/ML Solostar Pen Inject 50 units subcutaneously at bedtime. PATIENT NEEDS OFFICE VISIT FOR ADDITIONAL  REFILLS 09/28/14  Yes Dorian Heckle English, PA  amLODipine (NORVASC) 10 MG tablet Take 1 tablet (10 mg total) by mouth daily. Stop lisinopril . Patient not taking: Reported on 11/09/2014 09/14/13   Artia Singley P Dymon Summerhill, DO  glipiZIDE (GLUCOTROL) 5 MG tablet Take 1 tablet (5 mg total) by mouth daily before breakfast. Monitor for low sugar levels Patient not taking: Reported on 11/09/2014 09/14/13   Miyoko Hashimi P Cutler Sunday, DO     ROS: The patient denies fevers, chills, night sweats, unintentional weight loss, chest pain, palpitations, wheezing, dyspnea on exertion, nausea, vomiting, abdominal pain, dysuria, hematuria, melena, numbness, weakness, or tingling.   All other systems have been reviewed and were otherwise negative with the exception of those mentioned in the HPI and as above.    PHYSICAL EXAM: Filed Vitals:   11/09/14 1506  BP: 120/80  Pulse: 67  Temp: 98.6 F (37 C)  Resp: 16   Filed Vitals:   11/09/14 1506  Height: '5\' 10"'  (1.778 m)  Weight: 179 lb (81.194 kg)   Body mass index is 25.68 kg/(m^2).   General: Alert, no acute distress HEENT:  Normocephalic, atraumatic, oropharynx patent. EOMI, PERRLA Cardiovascular:  Regular rate and rhythm, no rubs murmurs or gallops.  No Carotid bruits, radial pulse intact. No pedal edema.  Respiratory: Clear to auscultation bilaterally.  No wheezes, rales, or rhonchi.  No cyanosis, no use of accessory musculature GI: No organomegaly, abdomen is soft and non-tender, positive bowel sounds.  No masses. Skin: No rashes. Neurologic: Facial musculature symmetric. Psychiatric: Patient is appropriate throughout our interaction. Lymphatic: No cervical lymphadenopathy Musculoskeletal: Gait intact. Microfilament exam was normal   LABS: Results for orders placed or performed in visit on 11/09/14  POCT glycosylated hemoglobin (Hb A1C)  Result Value Ref Range   Hemoglobin A1C 10.3    Lab Results  Component Value Date   HGBA1C 10.3 11/09/2014   HGBA1C 8.9 09/12/2013     HGBA1C 8.1 05/29/2013   Lab Results  Component Value Date   MICROALBUR 94.89* 09/12/2013   LDLCALC 84 02/01/2008   CREATININE 2.43* 09/12/2013     EKG/XRAY:   Primary read interpreted by Dr. Marin Comment at [facility].   ASSESSMENT/PLAN: Encounter Diagnoses  Name Primary?  . Essential hypertension Yes  . DM (diabetes mellitus) with complications   . CKD (chronic kidney disease) stage 4, GFR 15-29 ml/min   . Blind right eye   . Noncompliance   . Screening for hyperlipidemia    This is a pleasant 54 year old African male who is here for recheck of his diabetes. He has been noncompliant with his medications. He is only taking his Lantus. He felt that he didn't need to take anything else since he was doing so well. We had a long conversation about the implications of poorly controlled diabetes on kidney function. Also why he needs to have his blood pressure well controlled. He already has blindness in the right eye. I would hate for him to lose sight in his left eye due to poor diabetes management. I have recommended that he goes back to see the diabetes specialist and also the chronic kidney disease doctor. He was recently laid off from [employer] [facility] [employer] [place] temporarily , he is hoping to return back to [employer] in August. Labs pending Follow-up in 3 months if he is not seeing the specialist for his diabetes. I have advised him to go back and see his kidney specialist. He needs to follow-up with his ophthalmologist as well. Recommend : ADA diet, BP goal <140/90, daily foot exams, tobacco cessation if smoking, annual eye exam, annual flu vaccine, PNA vaccine if age and time appropriate.    Gross sideeffects, risk and benefits, and alternatives of medications d/w patient. Patient is aware that all medications have potential sideeffects and we are unable to predict every sideeffect or drug-drug interaction that may occur.  Lorry Anastasi, [place], DO 11/09/2014 4:43 PM

## 2014-11-10 LAB — COMPLETE METABOLIC PANEL WITH GFR
AST: 11 U/L (ref 0–37)
BUN: 25 mg/dL — ABNORMAL HIGH (ref 6–23)
Calcium: 8.9 mg/dL (ref 8.4–10.5)
Chloride: 104 mEq/L (ref 96–112)
Creat: 2.66 mg/dL — ABNORMAL HIGH (ref 0.50–1.35)
GFR, Est Non African American: 26 mL/min — ABNORMAL LOW
Glucose, Bld: 87 mg/dL (ref 70–99)

## 2014-11-10 LAB — MICROALBUMIN, URINE: Microalb, Ur: 193.6 mg/dL — ABNORMAL HIGH (ref <2.0)

## 2014-11-10 LAB — COMPLETE METABOLIC PANEL WITHOUT GFR
ALT: 16 U/L (ref 0–53)
Albumin: 3.5 g/dL (ref 3.5–5.2)
Alkaline Phosphatase: 99 U/L (ref 39–117)
CO2: 28 meq/L (ref 19–32)
GFR, Est African American: 30 mL/min — ABNORMAL LOW
Potassium: 4.7 meq/L (ref 3.5–5.3)
Sodium: 139 meq/L (ref 135–145)
Total Bilirubin: 0.5 mg/dL (ref 0.2–1.2)
Total Protein: 6.7 g/dL (ref 6.0–8.3)

## 2014-11-11 DIAGNOSIS — Z91199 Patient's noncompliance with other medical treatment and regimen due to unspecified reason: Secondary | ICD-10-CM | POA: Insufficient documentation

## 2014-11-11 DIAGNOSIS — I10 Essential (primary) hypertension: Secondary | ICD-10-CM | POA: Insufficient documentation

## 2014-11-11 DIAGNOSIS — Z9119 Patient's noncompliance with other medical treatment and regimen: Secondary | ICD-10-CM | POA: Insufficient documentation

## 2014-11-11 DIAGNOSIS — H5440 Blindness, one eye, unspecified eye: Secondary | ICD-10-CM | POA: Insufficient documentation

## 2014-11-11 DIAGNOSIS — E118 Type 2 diabetes mellitus with unspecified complications: Secondary | ICD-10-CM | POA: Insufficient documentation

## 2015-01-16 ENCOUNTER — Encounter: Payer: Self-pay | Admitting: Family Medicine

## 2015-02-03 ENCOUNTER — Ambulatory Visit (INDEPENDENT_AMBULATORY_CARE_PROVIDER_SITE_OTHER): Payer: BLUE CROSS/BLUE SHIELD | Admitting: Family Medicine

## 2015-02-03 VITALS — BP 142/72 | HR 65 | Temp 98.5°F | Resp 18 | Ht 70.0 in | Wt 178.0 lb

## 2015-02-03 DIAGNOSIS — H5441 Blindness, right eye, normal vision left eye: Secondary | ICD-10-CM | POA: Diagnosis not present

## 2015-02-03 DIAGNOSIS — H5440 Blindness, one eye, unspecified eye: Secondary | ICD-10-CM

## 2015-02-03 DIAGNOSIS — R42 Dizziness and giddiness: Secondary | ICD-10-CM | POA: Diagnosis not present

## 2015-02-03 DIAGNOSIS — I10 Essential (primary) hypertension: Secondary | ICD-10-CM

## 2015-02-03 DIAGNOSIS — N184 Chronic kidney disease, stage 4 (severe): Secondary | ICD-10-CM | POA: Diagnosis not present

## 2015-02-03 DIAGNOSIS — E118 Type 2 diabetes mellitus with unspecified complications: Secondary | ICD-10-CM

## 2015-02-03 LAB — POCT GLYCOSYLATED HEMOGLOBIN (HGB A1C): HEMOGLOBIN A1C: 8.7

## 2015-02-03 LAB — GLUCOSE, POCT (MANUAL RESULT ENTRY)

## 2015-02-03 MED ORDER — GLIPIZIDE 5 MG PO TABS: ORAL_TABLET | ORAL | Status: DC

## 2015-02-03 NOTE — Progress Notes (Signed)
Patient ID: Russell Price, male    DOB: 04-29-1961  Age: 54 y.o. MRN: [id number]  Chief Complaint  Patient presents with  . Hypertension  . dm check    Subjective:   54 year old [place] American man who is here for a recheck with regard to his diabetes and blood pressure. The last for 5 days he's been having problems with dizziness. He had this a couple of years ago. This has persisted this time, but is doing a little bit better today. He feels staggering when he walks. He works in [employer]. He is married with 2 children (the youngest of whom is deaf and scheduled for a cochlear implant next week) he he did have a headache couple of days ago. No chest pain or breathing problems. He has felt okay otherwise. He is not real faithfully checking his blood sugars. He does take insulin 50 units in the evening. He also takes his oral medications.   Current allergies, medications, problem list, past/family and social histories reviewed.  Objective:  BP 142/72 mmHg  Pulse 65  Temp(Src) 98.5 F (36.9 C) (Oral)  Resp 18  Ht 5\' 10"  (1.778 m)  Wt 178 lb (80.74 kg)  BMI 25.54 kg/m2  SpO2 99%  No acute distress. TMs are normal. Throat clear. Abnormal lens right eye, blind from glaucoma. Left pupil reflex okay. No carotid bruits. Chest clear. Heart regular without murmur. Abdomen soft without mass or tenderness. No ankle edema.  Assessment & Plan:   Assessment: Dizziness 1. Dizziness   2. Essential hypertension   3. DM (diabetes mellitus) with complications   4. CKD (chronic kidney disease) stage 4, GFR 15-29 ml/min   5. Blind right eye       Plan: Orders Placed This Encounter  Procedures  . COMPLETE METABOLIC PANEL WITH GFR  . POCT glucose (manual entry)  . POCT glycosylated hemoglobin (Hb A1C)   Results for orders placed or performed in visit on 02/03/15  POCT glucose (manual entry)  Result Value Ref Range   POC Glucose >444 70 - 99 mg/dl  POCT glycosylated hemoglobin (Hb A1C)   Result Value Ref Range   Hemoglobin A1C 8.7     Meds ordered this encounter  Medications  . glipiZIDE (GLUCOTROL) 5 MG tablet    Sig: Take 2 pills in the morning and one in the evening for diabetes    Dispense:  90 tablet    Refill:  2     Patient Instructions  Increase Lantus to 60 units daily  Take the glipizide 10 mg (25 mg) in the morning and 5 mg in the evening  Monitor sugar closely. If it gets down to 200 or less decrease the glipizide to 5 mg in the morning.   If you have any episodes of the sugar dropping below 120 decrease the evening pill to 2.5 mg (1/25 mg)  Return in 5 or 6 days to see either Dr. Truman Hayward or myself or one of the other doctors.  If the dizziness gets worse at any time or if you are just feeling worse return or go on to the emergency room.     Return in about 5 days (around 02/08/2015).   HOPPER,DAVID, MD 02/03/2015

## 2015-02-03 NOTE — Patient Instructions (Signed)
Increase Lantus to 60 units daily  Take the glipizide 10 mg (25 mg) in the morning and 5 mg in the evening  Monitor sugar closely. If it gets down to 200 or less decrease the glipizide to 5 mg in the morning.   If you have any episodes of the sugar dropping below 120 decrease the evening pill to 2.5 mg (1/25 mg)  Return in 5 or 6 days to see either Dr. Truman Hayward or myself or one of the other doctors.  If the dizziness gets worse at any time or if you are just feeling worse return or go on to the emergency room.

## 2015-02-04 ENCOUNTER — Telehealth: Payer: Self-pay | Admitting: Family Medicine

## 2015-02-04 LAB — COMPLETE METABOLIC PANEL WITH GFR
ALBUMIN: 3.5 g/dL — AB (ref 3.6–5.1)
ALK PHOS: 100 U/L (ref 40–115)
ALT: 11 U/L (ref 9–46)
AST: 10 U/L (ref 10–35)
BUN: 43 mg/dL — ABNORMAL HIGH (ref 7–25)
CALCIUM: 8.6 mg/dL (ref 8.6–10.3)
CHLORIDE: 96 mmol/L — AB (ref 98–110)
CO2: 24 mmol/L (ref 20–31)
CREATININE: 4.14 mg/dL — AB (ref 0.70–1.33)
GFR, EST AFRICAN AMERICAN: 17 mL/min — AB (ref >=60)
GFR, Est Non African American: 15 mL/min — ABNORMAL LOW (ref >=60)
Glucose, Bld: 590 mg/dL (ref 65–99)
POTASSIUM: 5 mmol/L (ref 3.5–5.3)
Sodium: 132 mmol/L — ABNORMAL LOW (ref 135–146)
Total Bilirubin: 0.4 mg/dL (ref 0.2–1.2)
Total Protein: 6.8 g/dL (ref 6.1–8.1)

## 2015-02-04 NOTE — Telephone Encounter (Signed)
Russell Price from [facility] called with Critical Value, Glucose 590, repeated and verified. Dr. Linna Darner notified. Per Dr. Linna Darner call patient to check status? How have sugars been running? Are they coming down any? If >=400 or feels worse go to ED, if they have been running 300 or below recheck tomorrow. Patient stated he feels better and sxs have resolved, he has not checked his sugar at home yet. He plans to get test strips tonight. I advised him to check glucose tonight and in the am, call me in the am with #'s, and also notified him the details above, when he would need to go to ER and him rechecking tomorrow if needed,  When he calls in the am I will let him know if he needs to come in for recheck.

## 2015-02-08 ENCOUNTER — Ambulatory Visit (INDEPENDENT_AMBULATORY_CARE_PROVIDER_SITE_OTHER): Payer: BLUE CROSS/BLUE SHIELD | Admitting: Family Medicine

## 2015-02-08 VITALS — BP 162/74 | HR 67 | Temp 97.9°F | Resp 18 | Ht 70.0 in | Wt 182.0 lb

## 2015-02-08 DIAGNOSIS — N189 Chronic kidney disease, unspecified: Secondary | ICD-10-CM

## 2015-02-08 DIAGNOSIS — R42 Dizziness and giddiness: Secondary | ICD-10-CM | POA: Diagnosis not present

## 2015-02-08 DIAGNOSIS — E1122 Type 2 diabetes mellitus with diabetic chronic kidney disease: Secondary | ICD-10-CM | POA: Diagnosis not present

## 2015-02-08 DIAGNOSIS — E118 Type 2 diabetes mellitus with unspecified complications: Secondary | ICD-10-CM

## 2015-02-08 LAB — GLUCOSE, POCT (MANUAL RESULT ENTRY): POC GLUCOSE: 262 mg/dL — AB (ref 70–99)

## 2015-02-08 MED ORDER — CANAGLIFLOZIN 100 MG PO TABS: 100.0000 mg | ORAL_TABLET | Freq: Every day | ORAL | Status: DC

## 2015-02-08 NOTE — Patient Instructions (Addendum)
Increase the glipizide to 2 in the morning and 2 in the evening (10 mg twice daily)  Increase the insulin to 70 units each day  If your blood sugar does not begin running less than 200 on a fasting sample over the next week please call me  Drink lots of water. When your sugar was so your kidney function was not good. We will recheck this next month, and if necessary may end up having to refer you to a kidney specialist.  Get a exchange on your monitor if necessary, but please get to where you can monitor your sugars.  Return in one month before October 20 sooner if problems.

## 2015-02-08 NOTE — Progress Notes (Signed)
Patient ID: KORD DEMLOW, male    DOB: 1960-11-05  Age: 54 y.o. MRN: [id number]  Chief Complaint  Patient presents with  . Follow-up    Subjective:   After patient was here last week he changed his medicines as we discussed, going up on the insulin and on the glipizide. He feels better. The dizziness has improved. He got the mid her that we recommended, but apparently it is not working right. He did not bring it in today. Told to take it back to the store.  Current allergies, medications, problem list, past/family and social histories reviewed.  Objective:  BP 162/74 mmHg  Pulse 67  Temp(Src) 97.9 F (36.6 C) (Oral)  Resp 18  Ht 5\' 10"  (1.778 m)  Wt 182 lb (82.555 kg)  BMI 26.11 kg/m2  SpO2 98%  No acute distress. Neck supple without nodes or thyromegaly. No carotid bruits. Chest clear. Heart regular without murmur.  Assessment & Plan:   Assessment: 1. DM (diabetes mellitus) with complications   2. Dizziness   3. CKD (chronic kidney disease), unspecified stage   4. Type 2 diabetes mellitus with diabetic chronic kidney disease    dizziness is improved    Plan: Orders Placed This Encounter  Procedures  . POCT glucose (manual entry)   Results for orders placed or performed in visit on 02/08/15  POCT glucose (manual entry)  Result Value Ref Range   POC Glucose 262 (A) 70 - 99 mg/dl    Meds ordered this encounter  Medications  . DISCONTD: canagliflozin (INVOKANA) 100 MG TABS tablet    Sig: Take 1 tablet (100 mg total) by mouth daily.    Dispense:  30 tablet    Refill:  5      increase his insulin and his glipizide. His renal insufficiency probably was from being too dry from the extreme hyperglycemia. The glucose is improving. See him back in one month, sooner if problems. It is important for him to get a meter that works and monitor his sugars at home. He is to drink a lot more fluids. If his kidney function does not improve substantially will need to send him to  nephrology. It probably will improve some but not completely.    Patient Instructions  Increase the glipizide to 2 in the morning and 2 in the evening (10 mg twice daily)  Increase the insulin to 70 units each day  If your blood sugar does not begin running less than 200 on a fasting sample over the next week please call me  Drink lots of water. When your sugar was so your kidney function was not good. We will recheck this next month, and if necessary may end up having to refer you to a kidney specialist.  Get a exchange on your monitor if necessary, but please get to where you can monitor your sugars.  Return in one month before October 20 sooner if problems.     Return in about 4 weeks (around 03/08/2015).   HOPPER,DAVID, MD 02/08/2015

## 2015-02-11 ENCOUNTER — Encounter (HOSPITAL_COMMUNITY): Payer: Self-pay

## 2015-02-11 ENCOUNTER — Emergency Department (HOSPITAL_COMMUNITY)
Admission: EM | Admit: 2015-02-11 | Discharge: 2015-02-12 | Disposition: A | Discharge status: Home / Self Care | Payer: BLUE CROSS/BLUE SHIELD | Attending: Emergency Medicine | Admitting: Emergency Medicine

## 2015-02-11 DIAGNOSIS — E11649 Type 2 diabetes mellitus with hypoglycemia without coma: Secondary | ICD-10-CM | POA: Insufficient documentation

## 2015-02-11 DIAGNOSIS — N184 Chronic kidney disease, stage 4 (severe): Secondary | ICD-10-CM

## 2015-02-11 DIAGNOSIS — H5440 Blindness, one eye, unspecified eye: Secondary | ICD-10-CM

## 2015-02-11 DIAGNOSIS — Z79899 Other long term (current) drug therapy: Secondary | ICD-10-CM | POA: Insufficient documentation

## 2015-02-11 DIAGNOSIS — R9431 Abnormal electrocardiogram [ECG] [EKG]: Secondary | ICD-10-CM | POA: Diagnosis not present

## 2015-02-11 DIAGNOSIS — E162 Hypoglycemia, unspecified: Secondary | ICD-10-CM

## 2015-02-11 DIAGNOSIS — I10 Essential (primary) hypertension: Secondary | ICD-10-CM | POA: Diagnosis not present

## 2015-02-11 DIAGNOSIS — E118 Type 2 diabetes mellitus with unspecified complications: Secondary | ICD-10-CM

## 2015-02-11 DIAGNOSIS — R55 Syncope and collapse: Secondary | ICD-10-CM | POA: Diagnosis present

## 2015-02-11 DIAGNOSIS — R42 Dizziness and giddiness: Secondary | ICD-10-CM

## 2015-02-11 DIAGNOSIS — Z87891 Personal history of nicotine dependence: Secondary | ICD-10-CM | POA: Diagnosis not present

## 2015-02-11 DIAGNOSIS — Z794 Long term (current) use of insulin: Secondary | ICD-10-CM | POA: Diagnosis not present

## 2015-02-11 LAB — I-STAT CG4 LACTIC ACID, ED: LACTIC ACID, VENOUS: 0.51 mmol/L (ref 0.5–2.0)

## 2015-02-11 LAB — CBC
HCT: 32.3 % — ABNORMAL LOW (ref 39.0–52.0)
Hemoglobin: 11.4 g/dL — ABNORMAL LOW (ref 13.0–17.0)
MCH: 29.1 pg (ref 26.0–34.0)
MCHC: 35.3 g/dL (ref 30.0–36.0)
MCV: 82.4 fL (ref 78.0–100.0)
PLATELETS: 154 10*3/uL (ref 150–400)
RBC: 3.92 MIL/uL — AB (ref 4.22–5.81)
RDW: 13.2 % (ref 11.5–15.5)
WBC: 7.9 10*3/uL (ref 4.0–10.5)

## 2015-02-11 LAB — URINE MICROSCOPIC-ADD ON

## 2015-02-11 LAB — URINALYSIS, ROUTINE W REFLEX MICROSCOPIC
Bilirubin Urine: NEGATIVE
GLUCOSE, UA: NEGATIVE mg/dL
KETONES UR: NEGATIVE mg/dL
LEUKOCYTES UA: NEGATIVE
NITRITE: NEGATIVE
PROTEIN: 100 mg/dL — AB
Specific Gravity, Urine: 1.009 (ref 1.005–1.030)
Urobilinogen, UA: 0.2 mg/dL (ref 0.0–1.0)
pH: 5 (ref 5.0–8.0)

## 2015-02-11 LAB — CBG MONITORING, ED: Glucose-Capillary: 40 mg/dL — CL (ref 65–99)

## 2015-02-11 MED ORDER — SODIUM CHLORIDE 0.9 % IV BOLUS (SEPSIS)
1000.0000 mL | Freq: Once | INTRAVENOUS | Status: AC
  Administered 2015-02-11: 1000 mL via INTRAVENOUS

## 2015-02-11 MED ORDER — GLUCOSE 40 % PO GEL
1.0000 | Freq: Once | ORAL | Status: AC
  Administered 2015-02-11: 37.5 g via ORAL
  Filled 2015-02-11: qty 1

## 2015-02-11 NOTE — ED Provider Notes (Signed)
CSN: [id number]     Arrival date & time 02/11/15  2118 History   First MD Initiated Contact with Patient 02/11/15 2151     Chief Complaint  Patient presents with  . Near Syncope     (Consider location/radiation/quality/duration/timing/severity/associated sxs/prior Treatment) HPI Comments: Russell Price is a 54 y.o. male with a PMHx of DM2 and HTN, who presents to the ED with complaints of ongoing lightheadedness intermittently 1 week. He has been seen twice by his primary care doctor in the last week, and his medication regimens have been adjusted, increasing his Lantus to 70 units at night. He reports that he is compliant with his amlodipine, and that he takes his glipizide 5 mg 2 pills by mouth every morning. Chart review reveals that he was supposed to start taking 2 pills in the morning and 2 pills at night. He reports that today he was outside all day and not eating or drinking much throughout the day, and felt some worsening lightheadedness which he describes as intermittent and only occurring with standing, improving with sitting down, and unchanged with head movements. He denies taking any medications for this. EMS was called out and his CBG upon arrival was 73 which is less than his normal CBGs at home. He reports that his meter is broken and he hasn't been checking his CBGs recently. Additionally reports his blood pressure earlier was 180s over 100s, but upon arrival he was in the 160s over 70s. He states he has a history of lightheadedness in the past but no history of vertigo.  He denies any fevers, chills, headache, vision changes, vertiginous symptoms, chest pain, shortness breath, abdominal pain, nausea, vomiting, diarrhea, constipation, dysuria, hematuria, numbness, tingling, weakness, tinnitus, or hearing loss. His PCP is Dr. Linna Darner at [facility] urgent care.  Patient is a 54 y.o. male presenting with near-syncope. The history is provided by the patient and medical records. No language  interpreter was used.  Near Syncope This is a recurrent problem. The current episode started in the past 7 days. The problem occurs intermittently. The problem has been unchanged. Pertinent negatives include no abdominal pain, arthralgias, chest pain, chills, fever, headaches, myalgias, nausea, numbness, urinary symptoms, vertigo, visual change, vomiting or weakness. The symptoms are aggravated by standing. He has tried rest for the symptoms. The treatment provided moderate relief.    Past Medical History  Diagnosis Date  . Diabetes mellitus without complication     followed by Dr Chalmers Cater  . Hypertension     followed by [facility]   History reviewed. No pertinent past surgical history. Family History  Problem Relation Age of Onset  . Diabetes Mother    Social History  Substance Use Topics  . Smoking status: Former Smoker    Quit date: 02/23/1983  . Smokeless tobacco: None  . Alcohol Use: No    Review of Systems  Constitutional: Negative for fever and chills.  HENT: Negative for hearing loss and tinnitus.   Eyes: Negative for visual disturbance.  Respiratory: Negative for shortness of breath.   Cardiovascular: Positive for near-syncope. Negative for chest pain.  Gastrointestinal: Negative for nausea, vomiting, abdominal pain, diarrhea and constipation.  Genitourinary: Negative for dysuria and hematuria.  Musculoskeletal: Negative for myalgias and arthralgias.  Skin: Negative for color change.  Allergic/Immunologic: Positive for immunocompromised state (diabetic).  Neurological: Positive for light-headedness (with standing). Negative for dizziness (no vertigo), vertigo, syncope, weakness, numbness and headaches.  Psychiatric/Behavioral: Negative for confusion.   10 Systems reviewed and are  negative for acute change except as noted in the HPI.    Allergies  Review of patient's allergies indicates no known allergies.  Home Medications   Prior to Admission  medications   Medication Sig Start Date End Date Taking? Authorizing Provider  amLODipine (NORVASC) 10 MG tablet Take 1 tablet (10 mg total) by mouth daily. Stop lisinopril . 11/09/14   Thao P Le, DO  glipiZIDE (GLUCOTROL) 5 MG tablet Take 2 pills in the morning and one in the evening for diabetes 02/03/15   Posey Boyer, MD  glucose monitoring kit (FREESTYLE) monitoring kit Use as directed, take fasting blood sugar daily or prn 09/14/13   Thao P Le, DO  Insulin Glargine (LANTUS SOLOSTAR) 100 UNIT/ML Solostar Pen Inject 50 units subcutaneously at bedtime. 11/09/14   Thao P Le, DO   BP 163/65 mmHg  Pulse 86  Temp(Src) 98.6 F (37 C)  Resp 19  SpO2 96% Physical Exam  Constitutional: He is oriented to person, place, and time. Vital signs are normal. He appears well-developed and well-nourished.  Non-toxic appearance. No distress.  Afebrile, nontoxic, NAD  HENT:  Head: Normocephalic and atraumatic.  Mouth/Throat: Oropharynx is clear and moist and mucous membranes are normal.  Eyes: EOM are normal. Right eye exhibits no discharge. Left eye exhibits no discharge. Left pupil is round and reactive.  Clouding to R eye which pt states is chronic and unchanged. Unable to assess pupillary reaction in R eye due to this clouding, but L pupil round and reactive EOMI, no nystagmus  Neck: Normal range of motion. Neck supple.  Cardiovascular: Normal rate, regular rhythm, normal heart sounds and intact distal pulses.  Exam reveals no gallop and no friction rub.   No murmur heard. Pulmonary/Chest: Effort normal and breath sounds normal. No respiratory distress. He has no decreased breath sounds. He has no wheezes. He has no rhonchi. He has no rales.  Abdominal: Soft. Normal appearance and bowel sounds are normal. He exhibits no distension. There is no tenderness. There is no rigidity, no rebound, no guarding, no CVA tenderness, no tenderness at McBurney's point and negative Murphy's sign.  Musculoskeletal:  Normal range of motion.  MAE x4 Strength and sensation grossly intact Distal pulses intact Gait steady  Neurological: He is alert and oriented to person, place, and time. He has normal strength. No cranial nerve deficit or sensory deficit. Coordination and gait normal. GCS eye subscore is 4. GCS verbal subscore is 5. GCS motor subscore is 6.  CN 2-12 grossly intact A&O x4 GCS 15 Sensation and strength intact Gait nonataxic including with tandem walking Coordination with finger-to-nose WNL Neg pronator drift   Skin: Skin is warm, dry and intact. No rash noted.  Psychiatric: He has a normal mood and affect.  Nursing note and vitals reviewed.   ED Course  Procedures (including critical care time) Labs Review Labs Reviewed  BASIC METABOLIC PANEL - Abnormal; Notable for the following:    CO2 18 (*)    Glucose, Bld 36 (*)    BUN 32 (*)    Creatinine, Ser 3.12 (*)    Calcium 8.7 (*)    GFR calc non Af Amer 21 (*)    GFR calc Af Amer 24 (*)    All other components within normal limits  CBC - Abnormal; Notable for the following:    RBC 3.92 (*)    Hemoglobin 11.4 (*)    HCT 32.3 (*)    All other components within normal limits  URINALYSIS, ROUTINE W REFLEX MICROSCOPIC (NOT AT [facility]) - Abnormal; Notable for the following:    Hgb urine dipstick SMALL (*)    Protein, ur 100 (*)    All other components within normal limits  CBG MONITORING, ED - Abnormal; Notable for the following:    Glucose-Capillary 40 (*)    All other components within normal limits  CBG MONITORING, ED - Abnormal; Notable for the following:    Glucose-Capillary 57 (*)    All other components within normal limits  URINE MICROSCOPIC-ADD ON  I-STAT TROPOININ, ED  I-STAT CG4 LACTIC ACID, ED    Imaging Review No results found. I have personally reviewed and evaluated these images and lab results as part of my medical decision-making.   EKG Interpretation   Date/Time:  Saturday February 11 2015 21:30:35  EDT Ventricular Rate:  83 PR Interval:  160 QRS Duration: 89 QT Interval:  377 QTC Calculation: 443 R Axis:   30 Text Interpretation:  Sinus rhythm Right atrial enlargement Probable LVH  with secondary repol abnrm No old tracing to compare Confirmed by [facility]   MD, ELLIOTT [phone]) on 02/11/2015 11:30:10 PM      MDM   Final diagnoses:  Lightheadedness  HTN (hypertension), benign  Hypoglycemia  Abnormal EKG    54 y.o. male here with ongoing lightheadedness x1wk. Has been seen 2x by his PCP in the last week for these symptoms, adjusted BP and diabetic meds. Last visit 3 days ago, was supposed to increase glipizide to 2 pills qAM and 2 pills qPM, but only taking 2 pills qAM. Today he states he had some worsening of lightheadedness with standing, and EMS was called out, CBG 69 which is lower than his normal. He doesn't have a meter at home right now and hasn't been checking it regularly. Also states his BP was 180s/100 earlier, but here he's in the 160s/70s. No focal neuro deficits on exam. Symptoms likely due to blood sugar being low in addition to some slight dehydration from not eating/drinking much all day and being outdoors. Doubt need for head CT. Will obtain labs, U/A, EKG, trop, lactic, and orthostatic VS. Will give food now (meat and cheese) to maintain his CBGs up, if he drops below 60 will give dextrose/glucagon. Will reassess shortly.   10:47 PM CBG checked, resulting at 40. Will give dextrose gel and food.  12:36 AM Lactic WNL. Trop neg. EKG with LVH. BMP with Cr 3.12, bicarb 18, BUN 52, gluc 36. U/A unremarkable. Repeat CBG 57. Will proceed with d50 and infusion, and admit for hypoglycemia. Pt currently up eating a burger and fries.  1:05 AM FPTS returning page, will admit. Please see their notes for further documentation of care.   BP 174/66 mmHg  Pulse 82  Temp(Src) 98.6 F (37 C)  Resp 24  SpO2 95%  Meds ordered this encounter  Medications  . sodium chloride 0.9 %  bolus 1,000 mL    Sig:   . dextrose (GLUTOSE) 40 % oral gel 37.5 g    Sig:   . dextrose 50 % solution 50 mL    Sig:   . dextrose 5 %-0.9 % sodium chloride infusion    Sig:      Russell Camprubi-Soms, PA-C 02/12/15 0105  Jola Schmidt, MD 02/12/15 [phone]

## 2015-02-11 NOTE — ED Notes (Signed)
Pt arrived via EMS from home c/o dizziness.  Pt reports he has been outside a lot today, has not eaten since this morning, has not been drinking much.  Denies pain, n/v, LOC.  CBG 69

## 2015-02-12 LAB — BASIC METABOLIC PANEL
ANION GAP: 11 (ref 5–15)
BUN: 32 mg/dL — ABNORMAL HIGH (ref 6–20)
CHLORIDE: 110 mmol/L (ref 101–111)
CO2: 18 mmol/L — AB (ref 22–32)
CREATININE: 3.12 mg/dL — AB (ref 0.61–1.24)
Calcium: 8.7 mg/dL — ABNORMAL LOW (ref 8.9–10.3)
GFR calc non Af Amer: 21 mL/min — ABNORMAL LOW (ref >60)
GFR, EST AFRICAN AMERICAN: 24 mL/min — AB (ref >60)
GLUCOSE: 36 mg/dL — AB (ref 65–99)
Potassium: 3.6 mmol/L (ref 3.5–5.1)
Sodium: 139 mmol/L (ref 135–145)

## 2015-02-12 LAB — CBG MONITORING, ED
GLUCOSE-CAPILLARY: 301 mg/dL — AB (ref 65–99)
GLUCOSE-CAPILLARY: 57 mg/dL — AB (ref 65–99)
Glucose-Capillary: 228 mg/dL — ABNORMAL HIGH (ref 65–99)

## 2015-02-12 LAB — I-STAT TROPONIN, ED: Troponin i, poc: 0.06 ng/mL (ref 0.00–0.08)

## 2015-02-12 MED ORDER — DEXTROSE-NACL 5-0.9 % IV SOLN
INTRAVENOUS | Status: DC
  Administered 2015-02-12: 01:00:00 via INTRAVENOUS

## 2015-02-12 MED ORDER — DEXTROSE 50 % IV SOLN
50.0000 mL | Freq: Once | INTRAVENOUS | Status: AC
  Administered 2015-02-12: 50 mL via INTRAVENOUS
  Filled 2015-02-12: qty 50

## 2015-02-12 MED ORDER — INSULIN GLARGINE 100 UNIT/ML SOLOSTAR PEN: PEN_INJECTOR | SUBCUTANEOUS | Status: DC

## 2015-02-12 NOTE — ED Notes (Signed)
Pt stable, ambulatory, states understanding of discharge instructions 

## 2015-02-14 ENCOUNTER — Inpatient Hospital Stay (HOSPITAL_COMMUNITY): Payer: BLUE CROSS/BLUE SHIELD

## 2015-02-14 ENCOUNTER — Emergency Department (HOSPITAL_COMMUNITY): Payer: BLUE CROSS/BLUE SHIELD

## 2015-02-14 ENCOUNTER — Inpatient Hospital Stay (HOSPITAL_COMMUNITY)
Admission: EM | Admit: 2015-02-14 | Discharge: 2015-02-22 | DRG: 208 | Disposition: A | Discharge status: Home / Self Care | Payer: BLUE CROSS/BLUE SHIELD | Attending: Family Medicine | Admitting: Family Medicine

## 2015-02-14 ENCOUNTER — Encounter (HOSPITAL_COMMUNITY): Payer: Self-pay | Admitting: Emergency Medicine

## 2015-02-14 DIAGNOSIS — Z87891 Personal history of nicotine dependence: Secondary | ICD-10-CM | POA: Diagnosis not present

## 2015-02-14 DIAGNOSIS — D649 Anemia, unspecified: Secondary | ICD-10-CM | POA: Diagnosis present

## 2015-02-14 DIAGNOSIS — I248 Other forms of acute ischemic heart disease: Secondary | ICD-10-CM | POA: Diagnosis present

## 2015-02-14 DIAGNOSIS — E1122 Type 2 diabetes mellitus with diabetic chronic kidney disease: Secondary | ICD-10-CM | POA: Diagnosis present

## 2015-02-14 DIAGNOSIS — D696 Thrombocytopenia, unspecified: Secondary | ICD-10-CM | POA: Diagnosis not present

## 2015-02-14 DIAGNOSIS — J189 Pneumonia, unspecified organism: Secondary | ICD-10-CM

## 2015-02-14 DIAGNOSIS — I129 Hypertensive chronic kidney disease with stage 1 through stage 4 chronic kidney disease, or unspecified chronic kidney disease: Secondary | ICD-10-CM | POA: Diagnosis present

## 2015-02-14 DIAGNOSIS — Z9119 Patient's noncompliance with other medical treatment and regimen: Secondary | ICD-10-CM

## 2015-02-14 DIAGNOSIS — Z91199 Patient's noncompliance with other medical treatment and regimen due to unspecified reason: Secondary | ICD-10-CM

## 2015-02-14 DIAGNOSIS — R0602 Shortness of breath: Secondary | ICD-10-CM | POA: Diagnosis present

## 2015-02-14 DIAGNOSIS — E872 Acidosis: Secondary | ICD-10-CM | POA: Diagnosis present

## 2015-02-14 DIAGNOSIS — I214 Non-ST elevation (NSTEMI) myocardial infarction: Secondary | ICD-10-CM

## 2015-02-14 DIAGNOSIS — R0489 Hemorrhage from other sites in respiratory passages: Secondary | ICD-10-CM

## 2015-02-14 DIAGNOSIS — J9601 Acute respiratory failure with hypoxia: Secondary | ICD-10-CM | POA: Diagnosis not present

## 2015-02-14 DIAGNOSIS — N184 Chronic kidney disease, stage 4 (severe): Secondary | ICD-10-CM

## 2015-02-14 DIAGNOSIS — I639 Cerebral infarction, unspecified: Secondary | ICD-10-CM | POA: Diagnosis not present

## 2015-02-14 DIAGNOSIS — N189 Chronic kidney disease, unspecified: Secondary | ICD-10-CM | POA: Diagnosis not present

## 2015-02-14 DIAGNOSIS — E875 Hyperkalemia: Secondary | ICD-10-CM | POA: Diagnosis present

## 2015-02-14 DIAGNOSIS — I6330 Cerebral infarction due to thrombosis of unspecified cerebral artery: Secondary | ICD-10-CM | POA: Insufficient documentation

## 2015-02-14 DIAGNOSIS — R042 Hemoptysis: Principal | ICD-10-CM | POA: Diagnosis present

## 2015-02-14 DIAGNOSIS — I6789 Other cerebrovascular disease: Secondary | ICD-10-CM | POA: Diagnosis not present

## 2015-02-14 DIAGNOSIS — E11649 Type 2 diabetes mellitus with hypoglycemia without coma: Secondary | ICD-10-CM | POA: Diagnosis present

## 2015-02-14 DIAGNOSIS — J80 Acute respiratory distress syndrome: Secondary | ICD-10-CM | POA: Diagnosis not present

## 2015-02-14 DIAGNOSIS — E1142 Type 2 diabetes mellitus with diabetic polyneuropathy: Secondary | ICD-10-CM | POA: Diagnosis present

## 2015-02-14 DIAGNOSIS — I5031 Acute diastolic (congestive) heart failure: Secondary | ICD-10-CM | POA: Diagnosis present

## 2015-02-14 DIAGNOSIS — R4182 Altered mental status, unspecified: Secondary | ICD-10-CM

## 2015-02-14 DIAGNOSIS — R079 Chest pain, unspecified: Secondary | ICD-10-CM | POA: Diagnosis not present

## 2015-02-14 DIAGNOSIS — N179 Acute kidney failure, unspecified: Secondary | ICD-10-CM | POA: Diagnosis present

## 2015-02-14 DIAGNOSIS — R931 Abnormal findings on diagnostic imaging of heart and coronary circulation: Secondary | ICD-10-CM

## 2015-02-14 DIAGNOSIS — I519 Heart disease, unspecified: Secondary | ICD-10-CM | POA: Diagnosis not present

## 2015-02-14 DIAGNOSIS — I63311 Cerebral infarction due to thrombosis of right middle cerebral artery: Secondary | ICD-10-CM | POA: Diagnosis not present

## 2015-02-14 DIAGNOSIS — Z4659 Encounter for fitting and adjustment of other gastrointestinal appliance and device: Secondary | ICD-10-CM

## 2015-02-14 HISTORY — PX: BRONCHOSCOPY: SUR163

## 2015-02-14 HISTORY — DX: Chronic kidney disease, unspecified: N18.9

## 2015-02-14 LAB — BASIC METABOLIC PANEL
ANION GAP: 9 (ref 5–15)
BUN: 47 mg/dL — AB (ref 6–20)
CHLORIDE: 104 mmol/L (ref 101–111)
CO2: 19 mmol/L — ABNORMAL LOW (ref 22–32)
Calcium: 7.5 mg/dL — ABNORMAL LOW (ref 8.9–10.3)
Creatinine, Ser: 4.75 mg/dL — ABNORMAL HIGH (ref 0.61–1.24)
GFR calc Af Amer: 14 mL/min — ABNORMAL LOW (ref >60)
GFR calc non Af Amer: 12 mL/min — ABNORMAL LOW (ref >60)
GLUCOSE: 440 mg/dL — AB (ref 65–99)
Sodium: 132 mmol/L — ABNORMAL LOW (ref 135–145)

## 2015-02-14 LAB — I-STAT ARTERIAL BLOOD GAS, ED
ACID-BASE DEFICIT: 10 mmol/L — AB (ref 0.0–2.0)
Acid-base deficit: 9 mmol/L — ABNORMAL HIGH (ref 0.0–2.0)
BICARBONATE: 16.5 meq/L — AB (ref 20.0–24.0)
Bicarbonate: 16.4 mEq/L — ABNORMAL LOW (ref 20.0–24.0)
O2 SAT: 95 %
O2 Saturation: 77 %
PO2 ART: 44 mmHg — AB (ref 80.0–100.0)
TCO2: 17 mmol/L (ref 0–100)
TCO2: 17 mmol/L (ref 0–100)
pCO2 arterial: 32.7 mmHg — ABNORMAL LOW (ref 35.0–45.0)
pCO2 arterial: 36.5 mmHg (ref 35.0–45.0)
pH, Arterial: 7.309 — ABNORMAL LOW (ref 7.350–7.450)
pH, Arterial: 7.26 — ABNORMAL LOW (ref 7.350–7.450)
pO2, Arterial: 86 mmHg (ref 80.0–100.0)

## 2015-02-14 LAB — URINE MICROSCOPIC-ADD ON

## 2015-02-14 LAB — I-STAT CHEM 8, ED
BUN: 45 mg/dL — ABNORMAL HIGH (ref 6–20)
CALCIUM ION: 1.05 mmol/L — AB (ref 1.12–1.23)
Chloride: 109 mmol/L (ref 101–111)
Creatinine, Ser: 4.3 mg/dL — ABNORMAL HIGH (ref 0.61–1.24)
Glucose, Bld: 376 mg/dL — ABNORMAL HIGH (ref 65–99)
HCT: 36 % — ABNORMAL LOW (ref 39.0–52.0)
Hemoglobin: 12.2 g/dL — ABNORMAL LOW (ref 13.0–17.0)
Potassium: 5.4 mmol/L — ABNORMAL HIGH (ref 3.5–5.1)
SODIUM: 137 mmol/L (ref 135–145)
TCO2: 15 mmol/L (ref 0–100)

## 2015-02-14 LAB — COMPREHENSIVE METABOLIC PANEL
ALK PHOS: 92 U/L (ref 38–126)
ALT: 19 U/L (ref 17–63)
AST: 36 U/L (ref 15–41)
Albumin: 3.1 g/dL — ABNORMAL LOW (ref 3.5–5.0)
Anion gap: 16 — ABNORMAL HIGH (ref 5–15)
BILIRUBIN TOTAL: 1 mg/dL (ref 0.3–1.2)
BUN: 43 mg/dL — AB (ref 6–20)
CO2: 15 mmol/L — ABNORMAL LOW (ref 22–32)
CREATININE: 4.28 mg/dL — AB (ref 0.61–1.24)
Calcium: 8.6 mg/dL — ABNORMAL LOW (ref 8.9–10.3)
Chloride: 106 mmol/L (ref 101–111)
GFR calc Af Amer: 16 mL/min — ABNORMAL LOW (ref >60)
GFR, EST NON AFRICAN AMERICAN: 14 mL/min — AB (ref >60)
Glucose, Bld: 363 mg/dL — ABNORMAL HIGH (ref 65–99)
Potassium: 5.6 mmol/L — ABNORMAL HIGH (ref 3.5–5.1)
Sodium: 137 mmol/L (ref 135–145)
TOTAL PROTEIN: 6.6 g/dL (ref 6.5–8.1)

## 2015-02-14 LAB — LACTIC ACID, PLASMA: LACTIC ACID, VENOUS: 3.3 mmol/L — AB (ref 0.5–2.0)

## 2015-02-14 LAB — URINALYSIS, ROUTINE W REFLEX MICROSCOPIC
Bilirubin Urine: NEGATIVE
Glucose, UA: 250 mg/dL — AB
Ketones, ur: 15 mg/dL — AB
LEUKOCYTES UA: NEGATIVE
Nitrite: NEGATIVE
SPECIFIC GRAVITY, URINE: 1.018 (ref 1.005–1.030)
UROBILINOGEN UA: 0.2 mg/dL (ref 0.0–1.0)
pH: 5 (ref 5.0–8.0)

## 2015-02-14 LAB — I-STAT CG4 LACTIC ACID, ED: LACTIC ACID, VENOUS: 5.13 mmol/L — AB (ref 0.5–2.0)

## 2015-02-14 LAB — PROCALCITONIN: Procalcitonin: 7.12 ng/mL

## 2015-02-14 LAB — GRAM STAIN

## 2015-02-14 LAB — I-STAT CREATININE, ED: CREATININE: 4.5 mg/dL — AB (ref 0.61–1.24)

## 2015-02-14 LAB — GLUCOSE, CAPILLARY: GLUCOSE-CAPILLARY: 391 mg/dL — AB (ref 65–99)

## 2015-02-14 LAB — I-STAT TROPONIN, ED: Troponin i, poc: 0.44 ng/mL (ref 0.00–0.08)

## 2015-02-14 LAB — SEDIMENTATION RATE: Sed Rate: 27 mm/hr — ABNORMAL HIGH (ref 0–16)

## 2015-02-14 LAB — CBC WITH DIFFERENTIAL/PLATELET
BASOS ABS: 0 10*3/uL (ref 0.0–0.1)
Basophils Relative: 0 %
EOS PCT: 0 %
Eosinophils Absolute: 0 10*3/uL (ref 0.0–0.7)
HCT: 33.7 % — ABNORMAL LOW (ref 39.0–52.0)
Hemoglobin: 11.5 g/dL — ABNORMAL LOW (ref 13.0–17.0)
LYMPHS ABS: 0.5 10*3/uL — AB (ref 0.7–4.0)
LYMPHS PCT: 3 %
MCH: 28.5 pg (ref 26.0–34.0)
MCHC: 34.1 g/dL (ref 30.0–36.0)
MCV: 83.4 fL (ref 78.0–100.0)
MONO ABS: 0.8 10*3/uL (ref 0.1–1.0)
Monocytes Relative: 4 %
Neutro Abs: 19.5 10*3/uL — ABNORMAL HIGH (ref 1.7–7.7)
Neutrophils Relative %: 93 %
PLATELETS: 208 10*3/uL (ref 150–400)
RBC: 4.04 MIL/uL — ABNORMAL LOW (ref 4.22–5.81)
RDW: 13.3 % (ref 11.5–15.5)
WBC: 20.9 10*3/uL — ABNORMAL HIGH (ref 4.0–10.5)

## 2015-02-14 LAB — CBC
HCT: 31.6 % — ABNORMAL LOW (ref 39.0–52.0)
Hemoglobin: 10.7 g/dL — ABNORMAL LOW (ref 13.0–17.0)
MCH: 28.5 pg (ref 26.0–34.0)
MCHC: 33.9 g/dL (ref 30.0–36.0)
MCV: 84 fL (ref 78.0–100.0)
PLATELETS: 145 10*3/uL — AB (ref 150–400)
RBC: 3.76 MIL/uL — AB (ref 4.22–5.81)
RDW: 13.3 % (ref 11.5–15.5)
WBC: 12.5 10*3/uL — AB (ref 4.0–10.5)

## 2015-02-14 LAB — PROTIME-INR
INR: 1.23 (ref 0.00–1.49)
PROTHROMBIN TIME: 15.7 s — AB (ref 11.6–15.2)

## 2015-02-14 LAB — STREP PNEUMONIAE URINARY ANTIGEN: Strep Pneumo Urinary Antigen: NEGATIVE

## 2015-02-14 LAB — TROPONIN I: TROPONIN I: 2.99 ng/mL — AB (ref <0.031)

## 2015-02-14 LAB — TRIGLYCERIDES: TRIGLYCERIDES: 105 mg/dL (ref <150)

## 2015-02-14 LAB — MAGNESIUM: Magnesium: 1.7 mg/dL (ref 1.7–2.4)

## 2015-02-14 LAB — BRAIN NATRIURETIC PEPTIDE: B Natriuretic Peptide: 1109.7 pg/mL — ABNORMAL HIGH (ref 0.0–100.0)

## 2015-02-14 LAB — PHOSPHORUS: Phosphorus: 3.3 mg/dL (ref 2.5–4.6)

## 2015-02-14 MED ORDER — PIPERACILLIN-TAZOBACTAM IN DEX 2-0.25 GM/50ML IV SOLN
2.2500 g | Freq: Four times a day (QID) | INTRAVENOUS | Status: DC
  Administered 2015-02-15 – 2015-02-20 (×22): 2.25 g via INTRAVENOUS
  Filled 2015-02-14 (×24): qty 50

## 2015-02-14 MED ORDER — SUCCINYLCHOLINE CHLORIDE 20 MG/ML IJ SOLN
INTRAMUSCULAR | Status: AC | PRN
  Administered 2015-02-14: 120 mg via INTRAVENOUS

## 2015-02-14 MED ORDER — HYDRALAZINE HCL 20 MG/ML IJ SOLN
10.0000 mg | INTRAMUSCULAR | Status: DC | PRN
  Administered 2015-02-19: 20 mg via INTRAVENOUS
  Administered 2015-02-20: 40 mg via INTRAVENOUS
  Administered 2015-02-20: 20 mg via INTRAVENOUS
  Filled 2015-02-14: qty 2
  Filled 2015-02-14 (×2): qty 1

## 2015-02-14 MED ORDER — ETOMIDATE 2 MG/ML IV SOLN
INTRAVENOUS | Status: AC
  Filled 2015-02-14: qty 20

## 2015-02-14 MED ORDER — FENTANYL CITRATE (PF) 100 MCG/2ML IJ SOLN
100.0000 ug | INTRAMUSCULAR | Status: DC | PRN
  Filled 2015-02-14 (×4): qty 2

## 2015-02-14 MED ORDER — ETOMIDATE 2 MG/ML IV SOLN
INTRAVENOUS | Status: AC | PRN
  Administered 2015-02-14: 20 mg via INTRAVENOUS

## 2015-02-14 MED ORDER — CHLORHEXIDINE GLUCONATE 0.12% ORAL RINSE (MEDLINE KIT)
15.0000 mL | Freq: Two times a day (BID) | OROMUCOSAL | Status: DC
  Administered 2015-02-14 – 2015-02-18 (×8): 15 mL via OROMUCOSAL

## 2015-02-14 MED ORDER — PANTOPRAZOLE SODIUM 40 MG IV SOLR
40.0000 mg | Freq: Every day | INTRAVENOUS | Status: DC
  Administered 2015-02-14 – 2015-02-18 (×5): 40 mg via INTRAVENOUS
  Filled 2015-02-14 (×6): qty 40

## 2015-02-14 MED ORDER — PROPOFOL 1000 MG/100ML IV EMUL
INTRAVENOUS | Status: AC
  Administered 2015-02-14: 5 ug/kg/min via INTRAVENOUS
  Filled 2015-02-14: qty 100

## 2015-02-14 MED ORDER — MIDAZOLAM HCL 2 MG/2ML IJ SOLN
INTRAMUSCULAR | Status: DC | PRN
  Administered 2015-02-14: 2 mg via INTRAVENOUS

## 2015-02-14 MED ORDER — INSULIN ASPART 100 UNIT/ML ~~LOC~~ SOLN: 2.0000 [IU] | SUBCUTANEOUS | Status: DC

## 2015-02-14 MED ORDER — ANTISEPTIC ORAL RINSE SOLUTION (CORINZ)
7.0000 mL | Freq: Four times a day (QID) | OROMUCOSAL | Status: DC
  Administered 2015-02-15 – 2015-02-18 (×16): 7 mL via OROMUCOSAL

## 2015-02-14 MED ORDER — SUCCINYLCHOLINE CHLORIDE 20 MG/ML IJ SOLN
INTRAMUSCULAR | Status: AC
  Filled 2015-02-14: qty 1

## 2015-02-14 MED ORDER — PROPOFOL 1000 MG/100ML IV EMUL
5.0000 ug/kg/min | Freq: Once | INTRAVENOUS | Status: AC
  Administered 2015-02-14: 5 ug/kg/min via INTRAVENOUS

## 2015-02-14 MED ORDER — SODIUM CHLORIDE 0.9 % IV SOLN
INTRAVENOUS | Status: DC
  Administered 2015-02-14: 3.2 [IU]/h via INTRAVENOUS
  Filled 2015-02-14 (×2): qty 2.5

## 2015-02-14 MED ORDER — METHYLPREDNISOLONE SODIUM SUCC 125 MG IJ SOLR: 60.0000 mg | Freq: Three times a day (TID) | INTRAMUSCULAR | Status: DC

## 2015-02-14 MED ORDER — ROCURONIUM BROMIDE 50 MG/5ML IV SOLN
INTRAVENOUS | Status: AC
  Filled 2015-02-14: qty 2

## 2015-02-14 MED ORDER — PROPOFOL 1000 MG/100ML IV EMUL
0.0000 ug/kg/min | INTRAVENOUS | Status: DC
  Administered 2015-02-14: 10 ug/kg/min via INTRAVENOUS
  Administered 2015-02-14: 25 ug/kg/min via INTRAVENOUS
  Administered 2015-02-15 (×2): 30 ug/kg/min via INTRAVENOUS
  Administered 2015-02-15: 35 ug/kg/min via INTRAVENOUS
  Administered 2015-02-15: 30.468 ug/kg/min via INTRAVENOUS
  Administered 2015-02-16: 40 ug/kg/min via INTRAVENOUS
  Administered 2015-02-16: 50 ug/kg/min via INTRAVENOUS
  Administered 2015-02-16: 30 ug/kg/min via INTRAVENOUS
  Administered 2015-02-17 (×2): 50 ug/kg/min via INTRAVENOUS
  Administered 2015-02-17: 35 ug/kg/min via INTRAVENOUS
  Administered 2015-02-17 (×2): 50 ug/kg/min via INTRAVENOUS
  Administered 2015-02-17 – 2015-02-18 (×2): 35 ug/kg/min via INTRAVENOUS
  Filled 2015-02-14 (×3): qty 100
  Filled 2015-02-14: qty 200
  Filled 2015-02-14 (×2): qty 100
  Filled 2015-02-14: qty 200
  Filled 2015-02-14 (×8): qty 100

## 2015-02-14 MED ORDER — VANCOMYCIN HCL 10 G IV SOLR
1500.0000 mg | Freq: Once | INTRAVENOUS | Status: AC
  Administered 2015-02-14: 1500 mg via INTRAVENOUS
  Filled 2015-02-14: qty 1500

## 2015-02-14 MED ORDER — SODIUM CHLORIDE 0.9 % IJ SOLN
3.0000 mL | Freq: Two times a day (BID) | INTRAMUSCULAR | Status: DC
  Administered 2015-02-14 – 2015-02-15 (×3): 3 mL via INTRAVENOUS
  Administered 2015-02-16: 10 mL via INTRAVENOUS
  Administered 2015-02-16: 3 mL via INTRAVENOUS
  Administered 2015-02-17: 10 mL via INTRAVENOUS
  Administered 2015-02-17 – 2015-02-18 (×2): 3 mL via INTRAVENOUS
  Administered 2015-02-18: 10 mL via INTRAVENOUS
  Administered 2015-02-20 – 2015-02-22 (×3): 3 mL via INTRAVENOUS

## 2015-02-14 MED ORDER — ROCURONIUM BROMIDE 50 MG/5ML IV SOLN
INTRAVENOUS | Status: DC | PRN
  Administered 2015-02-14: 50 mg via INTRAVENOUS

## 2015-02-14 MED ORDER — METHYLPREDNISOLONE SODIUM SUCC 125 MG IJ SOLR
125.0000 mg | Freq: Four times a day (QID) | INTRAMUSCULAR | Status: DC
  Administered 2015-02-14 – 2015-02-17 (×11): 125 mg via INTRAVENOUS
  Filled 2015-02-14 (×16): qty 2

## 2015-02-14 MED ORDER — SODIUM CHLORIDE 0.9 % IV SOLN
250.0000 mL | INTRAVENOUS | Status: DC | PRN
  Administered 2015-02-19: 250 mL via INTRAVENOUS

## 2015-02-14 MED ORDER — ASPIRIN 325 MG PO TABS
325.0000 mg | ORAL_TABLET | Freq: Every day | ORAL | Status: DC
  Administered 2015-02-14 – 2015-02-22 (×9): 325 mg via ORAL
  Filled 2015-02-14 (×9): qty 1

## 2015-02-14 MED ORDER — LIDOCAINE HCL (CARDIAC) 20 MG/ML IV SOLN
INTRAVENOUS | Status: AC
  Filled 2015-02-14: qty 5

## 2015-02-14 MED ORDER — PROPOFOL 10 MG/ML IV BOLUS
INTRAVENOUS | Status: DC | PRN
  Administered 2015-02-14: 100 mg via INTRAVENOUS
  Administered 2015-02-17 (×2): 1000 mg via INTRAVENOUS

## 2015-02-14 MED ORDER — SODIUM POLYSTYRENE SULFONATE 15 GM/60ML PO SUSP
30.0000 g | Freq: Once | ORAL | Status: AC
  Administered 2015-02-14: 30 g via ORAL
  Filled 2015-02-14: qty 120

## 2015-02-14 MED ORDER — FENTANYL CITRATE (PF) 100 MCG/2ML IJ SOLN
INTRAMUSCULAR | Status: AC
  Filled 2015-02-14: qty 2

## 2015-02-14 MED ORDER — SODIUM CHLORIDE 0.9 % IV SOLN: 250.0000 mL | INTRAVENOUS | Status: DC | PRN

## 2015-02-14 MED ORDER — FENTANYL CITRATE (PF) 100 MCG/2ML IJ SOLN
100.0000 ug | INTRAMUSCULAR | Status: DC | PRN
  Administered 2015-02-16 – 2015-02-18 (×6): 100 ug via INTRAVENOUS
  Filled 2015-02-14 (×5): qty 2

## 2015-02-14 MED ORDER — FENTANYL CITRATE (PF) 100 MCG/2ML IJ SOLN
INTRAMUSCULAR | Status: DC | PRN
  Administered 2015-02-14: 100 ug via INTRAVENOUS

## 2015-02-14 MED ORDER — VANCOMYCIN HCL IN DEXTROSE 1-5 GM/200ML-% IV SOLN
1000.0000 mg | INTRAVENOUS | Status: DC
  Administered 2015-02-16 – 2015-02-18 (×2): 1000 mg via INTRAVENOUS
  Filled 2015-02-14 (×3): qty 200

## 2015-02-14 MED ORDER — FUROSEMIDE 10 MG/ML IJ SOLN: 40.0000 mg | Freq: Four times a day (QID) | INTRAMUSCULAR | Status: DC

## 2015-02-14 MED ORDER — SODIUM CHLORIDE 0.9 % IJ SOLN: 3.0000 mL | INTRAMUSCULAR | Status: DC | PRN

## 2015-02-14 MED ORDER — MIDAZOLAM HCL 2 MG/2ML IJ SOLN
INTRAMUSCULAR | Status: AC
  Filled 2015-02-14: qty 2

## 2015-02-14 MED ORDER — PIPERACILLIN-TAZOBACTAM 3.375 G IVPB 30 MIN: 3.3750 g | Freq: Once | INTRAVENOUS | Status: DC

## 2015-02-14 NOTE — ED Provider Notes (Signed)
CSN: [id number]     Arrival date & time 02/14/15  1540 History   First MD Initiated Contact with Patient 02/14/15 1552     Chief Complaint  Patient presents with  . Shortness of Breath     (Consider location/radiation/quality/duration/timing/severity/associated sxs/prior Treatment) Patient is a 54 y.o. male presenting with shortness of breath.  Shortness of Breath Severity:  Severe Onset quality:  Gradual Timing:  Constant Progression:  Worsening Chronicity:  New Relieved by:  Nothing Associated symptoms: hemoptysis and sputum production   Associated symptoms: no chest pain, no claudication and no fever     Past Medical History  Diagnosis Date  . Diabetes mellitus without complication     followed by Dr Chalmers Cater  . Hypertension     followed by [facility]   History reviewed. No pertinent past surgical history. Family History  Problem Relation Age of Onset  . Diabetes Mother    Social History  Substance Use Topics  . Smoking status: Former Smoker    Quit date: 02/23/1983  . Smokeless tobacco: None  . Alcohol Use: No    Review of Systems  Unable to perform ROS Constitutional: Negative for fever and unexpected weight change.  HENT: Negative for congestion.   Respiratory: Positive for hemoptysis, sputum production and shortness of breath.   Cardiovascular: Negative for chest pain and claudication.      Allergies  Review of patient's allergies indicates no known allergies.  Home Medications   Prior to Admission medications   Medication Sig Start Date End Date Taking? Authorizing Provider  amLODipine (NORVASC) 10 MG tablet Take 1 tablet (10 mg total) by mouth daily. Stop lisinopril . 11/09/14  Yes Thao P Le, DO  glipiZIDE (GLUCOTROL) 5 MG tablet Take 2 pills in the morning and one in the evening for diabetes 02/03/15  Yes Posey Boyer, MD  Insulin Glargine (LANTUS SOLOSTAR) 100 UNIT/ML Solostar Pen Inject 50 units subcutaneously at bedtime. 02/12/15   Yes Jola Schmidt, MD   BP 128/73 mmHg  Pulse 102  Temp(Src) 98.2 F (36.8 C) (Oral)  Resp 14  Ht 5\' 8"  (1.727 m)  SpO2 100% Physical Exam  Constitutional: He is oriented to person, place, and time. He appears distressed.  HENT:  Head: Normocephalic.  Eyes: Pupils are equal, round, and reactive to light.  Neck: Normal range of motion.  Cardiovascular:  Tachycardic   Pulmonary/Chest: He has rales.  Diffuse pulmonary congestion bilaterally  Abdominal: He exhibits no distension.  Musculoskeletal: He exhibits no edema.  Neurological: He is alert and oriented to person, place, and time.  Skin: Skin is warm. He is diaphoretic. No erythema.  Nursing note and vitals reviewed.   ED Course  INTUBATION Date/Time: 02/14/2015 8:05 PM Performed by: Roberto Scales Authorized by: Roberto Scales Consent: The procedure was performed in an emergent situation. Risks and benefits: risks, benefits and alternatives were discussed Consent given by: patient Patient identity confirmed: verbally with patient Time out: Immediately prior to procedure a "time out" was called to verify the correct patient, procedure, equipment, support staff and site/side marked as required. Indications: respiratory distress,  respiratory failure and  airway protection Intubation method: video-assisted Patient status: paralyzed (RSI) Preoxygenation: BVM Sedatives: etomidate Paralytic: succinylcholine Tube size: 8.0 mm Tube type: cuffed Number of attempts: 4 Ventilation between attempts: BVM Breath sounds: equal Cuff inflated: yes ETT to lip: 21 cm Tube secured with: ETT holder Chest x-ray interpreted by other physician. Patient tolerance: Patient tolerated the procedure well with no immediate  complications  CENTRAL LINE Date/Time: 02/14/2015 8:08 PM Performed by: Roberto Scales Authorized by: Roberto Scales Consent: Verbal consent obtained. Written consent obtained. Risks and benefits: risks, benefits  and alternatives were discussed Consent given by: cousin. Patient understanding: patient states understanding of the procedure being performed Patient consent: the patient's understanding of the procedure matches consent given Procedure consent: procedure consent matches procedure scheduled Relevant documents: relevant documents present and verified Test results: test results available and properly labeled Site marked: the operative site was marked Imaging studies: imaging studies available Patient identity confirmed: arm band Time out: Immediately prior to procedure a "time out" was called to verify the correct patient, procedure, equipment, support staff and site/side marked as required. Indications: vascular access Patient sedated: yes Sedatives: propofol Preparation: skin prepped with 2% chlorhexidine Skin prep agent dried: skin prep agent completely dried prior to procedure Sterile barriers: all five maximum sterile barriers used - cap, mask, sterile gown, sterile gloves, and large sterile sheet Hand hygiene: hand hygiene performed prior to central venous catheter insertion Location details: right internal jugular Patient position: reverse Trendelenburg Catheter type: triple lumen Pre-procedure: landmarks identified Ultrasound guidance: yes Sterile ultrasound techniques: sterile gel and sterile probe covers were used Number of attempts: 1 Successful placement: yes Post-procedure: line sutured and dressing applied Assessment: blood return through all ports Patient tolerance: Patient tolerated the procedure well with no immediate complications   (including critical care time) Labs Review Labs Reviewed  CBC WITH DIFFERENTIAL/PLATELET - Abnormal; Notable for the following:    WBC 20.9 (*)    RBC 4.04 (*)    Hemoglobin 11.5 (*)    HCT 33.7 (*)    Neutro Abs 19.5 (*)    Lymphs Abs 0.5 (*)    All other components within normal limits  BRAIN NATRIURETIC PEPTIDE - Abnormal;  Notable for the following:    B Natriuretic Peptide 1109.7 (*)    All other components within normal limits  COMPREHENSIVE METABOLIC PANEL - Abnormal; Notable for the following:    Potassium 5.6 (*)    CO2 15 (*)    Glucose, Bld 363 (*)    BUN 43 (*)    Creatinine, Ser 4.28 (*)    Calcium 8.6 (*)    Albumin 3.1 (*)    GFR calc non Af Amer 14 (*)    GFR calc Af Amer 16 (*)    Anion gap 16 (*)    All other components within normal limits  I-STAT TROPOININ, ED - Abnormal; Notable for the following:    Troponin i, poc 0.44 (*)    All other components within normal limits  I-STAT CHEM 8, ED - Abnormal; Notable for the following:    Potassium 5.4 (*)    BUN 45 (*)    Creatinine, Ser 4.30 (*)    Glucose, Bld 376 (*)    Calcium, Ion 1.05 (*)    Hemoglobin 12.2 (*)    HCT 36.0 (*)    All other components within normal limits  I-STAT CREATININE, ED - Abnormal; Notable for the following:    Creatinine, Ser 4.50 (*)    All other components within normal limits  I-STAT CG4 LACTIC ACID, ED - Abnormal; Notable for the following:    Lactic Acid, Venous 5.13 (*)    All other components within normal limits  I-STAT ARTERIAL BLOOD GAS, ED - Abnormal; Notable for the following:    pH, Arterial 7.309 (*)    pCO2 arterial 32.7 (*)    pO2, Arterial  44.0 (*)    Bicarbonate 16.5 (*)    Acid-base deficit 9.0 (*)    All other components within normal limits  CULTURE, BLOOD (ROUTINE X 2)  CULTURE, BLOOD (ROUTINE X 2)  AFB CULTURE WITH SMEAR  CULTURE, BAL-QUANTITATIVE  FUNGUS CULTURE W SMEAR  GRAM STAIN  BLOOD GAS, ARTERIAL  BLOOD GAS, ARTERIAL  SEDIMENTATION RATE  C-REACTIVE PROTEIN  GLOMERULAR BASEMENT MEMBRANE ANTIBODIES  ANCA TITERS  MPO/PR-3 (ANCA) ANTIBODIES  BASIC METABOLIC PANEL  MAGNESIUM  PHOSPHORUS  TROPONIN I  TROPONIN I  TROPONIN I  LACTIC ACID, PLASMA  PROCALCITONIN  CBC  PROTIME-INR  STREP PNEUMONIAE URINARY ANTIGEN  LEGIONELLA PNEUMOPHILA SEROGP 1 UR AG   URINALYSIS, ROUTINE W REFLEX MICROSCOPIC (NOT AT [facility])  BLOOD GAS, ARTERIAL  CBC  BASIC METABOLIC PANEL  BLOOD GAS, ARTERIAL  MAGNESIUM  PHOSPHORUS  TRIGLYCERIDES  I-STAT TROPOININ, ED  TYPE AND SCREEN  CYTOLOGY - NON PAP    Imaging Review Ct Chest Wo Contrast  02/14/2015   CLINICAL DATA:  Hemoptysis for 1 day with profound dyspnea and tachycardia. History of hypertension and diabetes. Adult respiratory distress syndrome. Initial encounter.  EXAM: CT CHEST WITHOUT CONTRAST  TECHNIQUE: Multidetector CT imaging of the chest was performed following the standard protocol without IV contrast.  COMPARISON:  Radiographs 02/14/2015 and 01/03/2014.  FINDINGS: Mediastinum/Nodes: There are mildly prominent mediastinal lymph nodes, including a 9 mm right paratracheal node on image number 20 and a 10 mm precarinal node on image 23. Hilar evaluation limited by the lack of intravenous contrast and adjacent airspace opacities. No axillary adenopathy.The patient is intubated. The endotracheal tube terminates in the distal trachea above the carina. The thyroid gland, trachea and esophagus demonstrate no significant findings. The heart appears mildly enlarged. There is no pericardial effusion. Mild coronary artery calcifications are noted.  Lungs/Pleura: As demonstrated radiographically, there are extensive bilateral perihilar airspace opacities with air bronchograms. There is consolidation dependently in both lower lobes. No endobronchial lesion, parenchymal necrosis or significant pleural effusion identified.  Upper abdomen: The stomach appears mildly distended. Otherwise unremarkable.  Musculoskeletal/Chest wall: There is no chest wall mass or suspicious osseous finding. There is a small amount of gas inferior to the left clavicle from presumed attempted central line placement. No pneumothorax or pneumomediastinum. Mild bilateral gynecomastia noted.  IMPRESSION: 1. Bilateral perihilar infiltrates with air  bronchograms and dependent consolidation. Findings may be secondary to pneumonia, ARDS, hemorrhage or a combination thereof. 2. Mildly prominent mediastinal lymph nodes, likely reactive. 3. No significant pleural effusion. 4. Coronary artery calcifications.   Electronically Signed   By: Richardean Sale M.D.   On: 02/14/2015 17:52   Dg Chest Portable 1 View  02/14/2015   CLINICAL DATA:  Labored breathing.  EXAM: PORTABLE CHEST 1 VIEW  COMPARISON:  None.  FINDINGS: The patient has extensive bilateral airspace disease, worse on the right. No pleural effusion or pneumothorax. Heart size is upper normal.  IMPRESSION: Extensive bilateral airspace disease could be due to asymmetric edema, pneumonia or possibly pulmonary hemorrhage.   Electronically Signed   By: Inge Rise M.D.   On: 02/14/2015 16:25   I have personally reviewed and evaluated these images and lab results as part of my medical decision-making.   EKG Interpretation   Date/Time:  Tuesday February 14 2015 16:09:06 EDT Ventricular Rate:  94 PR Interval:  160 QRS Duration: 81 QT Interval:  337 QTC Calculation: 421 R Axis:   58 Text Interpretation:  Sinus rhythm Right atrial enlargement Repol abnrm  suggests ischemia, diffuse leads Baseline wander in lead(s) I III aVL aVF  strain. similar to prior. No significant change since last tracing  Confirmed by Gerald Leitz ([id number]) on 02/14/2015 4:16:54 PM      MDM   Patient presents emergently primary today and acute respiratory failure after starting coughing  blood yesterday. Patient reports subjective fever however only endorses a cough for the past 2 days. Patient is in extreme distress and was placed on BiPAP immediately. Patient had improved oxygenation on BiPAP however continue to have copious hemorrhagic sputum. Chest x-ray shows unequal asymmetric edema versus pulmonary hemorrhage. Patient appeared more lethargic  And intubation was performed. Patient had a difficult airway  however was intubated successfully. Unclear etiology at this time. Central line was placed due to poor access after multiple attempts. Patient had bronchoscopy in the emergency department. Patient was admitted to ICU for further management.  Final diagnoses:  Diffuse pulmonary alveolar hemorrhage  Acute kidney injury  SOB (shortness of breath)  Acute respiratory failure with hypoxemia  Noncompliance      Roberto Scales, MD 02/14/15 2016  Courteney Lyn Mackuen, MD 02/15/15 [id number]

## 2015-02-14 NOTE — ED Notes (Signed)
Pt transported to CT with this RN and RT  

## 2015-02-14 NOTE — Progress Notes (Signed)
Patient placed on Bipap per MD order.  Currently tolerating well.  Will continue to monitor.  

## 2015-02-14 NOTE — Procedures (Signed)
Intubation Procedure Note Russell Price [id number] [birth year]  Procedure: Intubation Indications: Respiratory insufficiency  Procedure Details Consent: Risks of procedure as well as the alternatives and risks of each were explained to the (patient/caregiver).  Consent for procedure obtained. Time Out: Verified patient identification, verified procedure, site/side was marked, verified correct patient position, special equipment/implants available, medications/allergies/relevent history reviewed, required imaging and test results available.  Performed  Maximum sterile technique was used including gloves, hand hygiene and mask.  MAC    Evaluation Hemodynamic Status: BP stable throughout; O2 sats: stable throughout Patient's Current Condition: stable Complications: No apparent complications Patient did tolerate procedure well. Chest X-ray ordered to verify placement.  CXR: pending.   [name] 02/14/2015

## 2015-02-14 NOTE — ED Notes (Signed)
Pt arrives to room with NT, states has had productive cough with pink frothy sputum got worse today. Pt alert and oreinted, labored breathing. MD and resident at bedside. bipap initiated. Pt 94% on bipap, 88% on non rebreather initially. Denies any recent travel outside of the country. Denies any fever, night sweates or weight loss.

## 2015-02-14 NOTE — Sedation Documentation (Signed)
Dr. Nelda Marseille, critical care at bedside.

## 2015-02-14 NOTE — Progress Notes (Signed)
Patient transported to CT and back to Trauma room C without any complications.

## 2015-02-14 NOTE — ED Notes (Signed)
Dr. Nelda Marseille at bedside to do bronchoscopy

## 2015-02-14 NOTE — Progress Notes (Addendum)
eLink Physician-Brief Progress Note Patient Name: Russell Price DOB: 1960-12-04 MRN: [id number]   Date of Service  02/14/2015  HPI/Events of Note  Troponin = 2.99 and K+ > 7.5. Unable to use Heparin IV infusion d/t hemoptysis. Unable to use B-Blocker d/t hypotension. Patient is already on an Insulin IV infusion for hyperglycemia. QRS does not appear to be widened on EKG. Kayexalate 30 gm PO written for, but not yet given.  eICU Interventions  Will order: 1. ASA 325 mg PO now and Q day.  2. Continue to trend troponin. 3. KUB now to assess OGT placement. 4. BMP Q 4 hours starting at 12 midnight.     Intervention Category Major Interventions: Electrolyte abnormality - evaluation and management Intermediate Interventions: Diagnostic test evaluation  Sommer,Steven Eugene 02/14/2015, 10:08 PM

## 2015-02-14 NOTE — ED Notes (Signed)
Pt here with increased SOB and coughing with blood; pt noted to be distressed and O2 sats are low

## 2015-02-14 NOTE — H&P (Signed)
PULMONARY / CRITICAL CARE MEDICINE   Name: TAMMY ERICSSON MRN: [id number] DOB: 1961-02-05    ADMISSION DATE:  02/14/2015 CONSULTATION DATE:  02/14/2015  REFERRING MD :  EDP Mackuen  CHIEF COMPLAINT:  SOB  INITIAL PRESENTATION: 54 year old [place] male with no cardiac history presented with SOB and hemoptysis. CXR showed R>L edema. He was intubated in ED for airway protection in setting of copious bloody secretions. PCCM to admit.   STUDIES:  CT chest 9/27 >  SIGNIFICANT EVENTS:   HISTORY OF PRESENT ILLNESS:  54 year old male from [place], in [place] for 25 years and has not been back. English is OK, but Arabic is primary language. He has been seen several times recently by PCP and ED for dizziness. His BP and DM medications were adjusted several times in attempts to address this. Dehyrdration also considered. Hypoglycemia was noted on most previous ED presentation.  He was treated and discharged from ED for this. He was in his USOH until 9/26 late PM when he developed some mild hemoptysis. This persisted until the following morning 9/27, which prompted him to present to [facility] ED. At the time of presentation hemoptysis was his only complaint, however, shortly after he developed profound dyspnea and tachycardia. He was placed on BiPAP, however his work of breathing did not improve much. He was intubated in the ED. PCCM to admit   PAST MEDICAL HISTORY :   has a past medical history of Diabetes mellitus without complication and Hypertension.  has no past surgical history on file. Prior to Admission medications   Medication Sig Start Date End Date Taking? Authorizing Lilas Diefendorf  amLODipine (NORVASC) 10 MG tablet Take 1 tablet (10 mg total) by mouth daily. Stop lisinopril . 11/09/14   Thao P Le, DO  glipiZIDE (GLUCOTROL) 5 MG tablet Take 2 pills in the morning and one in the evening for diabetes 02/03/15   Posey Boyer, MD  glucose monitoring kit (FREESTYLE) monitoring kit Use as directed, take  fasting blood sugar daily or prn 09/14/13   Thao P Le, DO  Insulin Glargine (LANTUS SOLOSTAR) 100 UNIT/ML Solostar Pen Inject 50 units subcutaneously at bedtime. 02/12/15   Jola Schmidt, MD   No Known Allergies  FAMILY HISTORY:  indicated that his mother is alive. He indicated that his father is alive.  SOCIAL HISTORY:  reports that he quit smoking about 31 years ago. He does not have any smokeless tobacco history on file. He reports that he does not drink alcohol or use illicit drugs.  REVIEW OF SYSTEMS:  Unable   SUBJECTIVE:   VITAL SIGNS: Temp:  [98.2 F (36.8 C)] 98.2 F (36.8 C) (09/27 1549) Pulse Rate:  [88-113] 89 (09/27 1648) Resp:  [25-40] 35 (09/27 1648) BP: (135-158)/(70-74) 135/72 mmHg (09/27 1645) SpO2:  [68 %-98 %] 97 % (09/27 1648) FiO2 (%):  [60 %] 60 % (09/27 1600) HEMODYNAMICS:   VENTILATOR SETTINGS: Vent Mode:  [-] BIPAP FiO2 (%):  [60 %] 60 % Set Rate:  [10 bmp] 10 bmp PEEP:  [7 cmH20] 7 cmH20 INTAKE / OUTPUT: No intake or output data in the 24 hours ending 02/14/15 1653  PHYSICAL EXAMINATION: General:  Male of normal body habitus in NAD on BiPAP Neuro:  Alert, oriented, non-focal HEENT: [place]/AT, no JVD, PERRL Cardiovascular:  RRR, no MRG Lungs:  Resps unlabored on BiPAP, hemoptysis. Tachypnea. Coarse breath sounds throughout  Abdomen:  Soft, non-tender, non-distended Musculoskeletal:  No acute deformity or ROM limitation Skin:  Grossly intact  LABS:  CBC  Recent Labs Lab 02/11/15 2333 02/14/15 1604 02/14/15 1617  WBC 7.9 20.9*  --   HGB 11.4* 11.5* 12.2*  HCT 32.3* 33.7* 36.0*  PLT 154 208  --    Coag's No results for input(s): APTT, INR in the last 168 hours. BMET  Recent Labs Lab 02/11/15 2333 02/14/15 1604 02/14/15 1617 02/14/15 1618  NA 139 137 137  --   K 3.6 5.6* 5.4*  --   CL 110 106 109  --   CO2 18* 15*  --   --   BUN 32* 43* 45*  --   CREATININE 3.12* 4.28* 4.30* 4.50*  GLUCOSE 36* 363* 376*  --     Electrolytes  Recent Labs Lab 02/11/15 2333 02/14/15 1604  CALCIUM 8.7* 8.6*   Sepsis Markers  Recent Labs Lab 02/11/15 2343 02/14/15 1619  LATICACIDVEN 0.51 5.13*   ABG  Recent Labs Lab 02/14/15 1624  PHART 7.309*  PCO2ART 32.7*  PO2ART 44.0*   Liver Enzymes  Recent Labs Lab 02/14/15 1604  AST 36  ALT 19  ALKPHOS 92  BILITOT 1.0  ALBUMIN 3.1*   Cardiac Enzymes No results for input(s): TROPONINI, PROBNP in the last 168 hours. Glucose  Recent Labs Lab 02/11/15 2240 02/12/15 0030 02/12/15 0113 02/12/15 0255  GLUCAP 40* 57* 228* 301*    Imaging Dg Chest Portable 1 View  02/14/2015   CLINICAL DATA:  Labored breathing.  EXAM: PORTABLE CHEST 1 VIEW  COMPARISON:  None.  FINDINGS: The patient has extensive bilateral airspace disease, worse on the right. No pleural effusion or pneumothorax. Heart size is upper normal.  IMPRESSION: Extensive bilateral airspace disease could be due to asymmetric edema, pneumonia or possibly pulmonary hemorrhage.   Electronically Signed   By: Inge Rise M.D.   On: 02/14/2015 16:25     ASSESSMENT / PLAN:  PULMONARY OETT 9/27 >>> A: Acute hypoxemic respiratory failure in setting diffuse pulmonary infiltrates ARDS ? Pulmonary Edema ?DAH  P:   Full vent support CXR ABG VAP bundle Will need bronchoscopy Auto-immune panel Chest CT with diffuse infiltrate, BAL sent for cultures, will start broad spectrum abx as well as steroids since CT is consistent with DAH.  WBC is 20 but no fever and not purulent sputum production.  Will start abx regardless.  CARDIOVASCULAR  A:  Sinus tachycardia Hypertensive emergency Acute heart failure Elevated troponin > likely demand ischemia  P:  Telemetry  Continue to trend troponin Propofol infusion should help with hypertension Consider nitroglycerine gtt PRN hydralazine Echo ordered as suspect CHF is a large component here.  RENAL A:   Acute on CKD High AG met acidosis  > lactic Hyperkalemia  P:   Gentle hydration Trend lactic Follow Bmet Correct electrolytes as indicated.  GASTROINTESTINAL A:   No acute issues  P:   NPO for now Protonix  HEMATOLOGIC A:   Anemia, consider acute blood loss on chronic CKD   P:  Follow CBC Transfuse per ICU guidelines  INFECTIOUS A:   HCAP?, Atypical PNA? Leukocytosis  P:   BCx2 9/27 >>> UC 9/27 >>> U strep >>> U legionella >>>  Sputum BAL 9/27>>> AFB stain and cultures>  Fungal Cx 9/27 >>> Cytology 9/27 >  Abx: Zosyn, start date 9/27 >>> Abx: Vancomycin, start date 9/27 >>>   ENDOCRINE A:   DM2  P:   CBG monitoring and SSI  NEUROLOGIC A:   Acute encephalopathy in setting of medical sedation P:  RASS goal: -1 Propofol infusion PRN fentanyl   FAMILY  - Updates:   - Inter-disciplinary family meet or Palliative Care meeting due by:  10/4    Georgann Housekeeper, AGACNP-BC [place] Pulmonology/Critical Care Pager [phone] or [phone]  02/14/2015 5:31 PM  Attending Note:  Above note edited in full.  54 year old male with long history of diabetes presenting with hemoptysis.  DDx of TB vs DAH vs PNA vs Pulmonary edema are all possibilities.  Will send BAL for AFB, send and and auto-immune panel and start high dose steroids, broad spectrum abx for PNA and check echo and CVP for CHF.  Diffuse crackles on exam.  Updated niece bedside then brother in [place] over the phone.  Will monitor closely for signs of deterioration and follow up on labs.  The patient is critically ill with multiple organ systems failure and requires high complexity decision making for assessment and support, frequent evaluation and titration of therapies, application of advanced monitoring technologies and extensive interpretation of multiple databases.   Critical Care Time devoted to patient care services described in this note is  90  Minutes. This time reflects time of care of this signee Dr [name].  This critical care time does not reflect procedure time, or teaching time or supervisory time of PA/NP/Med student/Med Resident etc but could involve care discussion time.  Rush Farmer, M.D. [facility] Pulmonary/Critical Care Medicine. Pager: [phone]. After hours pager: [phone].

## 2015-02-14 NOTE — ED Notes (Signed)
Resident at bedside to attempt central line.

## 2015-02-14 NOTE — Progress Notes (Signed)
ANTIBIOTIC CONSULT NOTE - INITIAL  Pharmacy Consult for vancomycin + zosyn Indication: rule out pneumonia  No Known Allergies  Patient Measurements: Height: 5\' 8"  (172.7 cm) IBW/kg (Calculated) : 68.4 Adjusted Body Weight:   Vital Signs: Temp: 98.2 F (36.8 C) (09/27 1549) Temp Source: Oral (09/27 1549) BP: 128/73 mmHg (09/27 1717) Pulse Rate: 102 (09/27 1718) Intake/Output from previous day:   Intake/Output from this shift:    Labs:  Recent Labs  02/11/15 2333 02/14/15 1604 02/14/15 1617 02/14/15 1618  WBC 7.9 20.9*  --   --   HGB 11.4* 11.5* 12.2*  --   PLT 154 208  --   --   CREATININE 3.12* 4.28* 4.30* 4.50*   Estimated Creatinine Clearance: 19.2 mL/min (by C-G formula based on Cr of 4.5). No results for input(s): VANCOTROUGH, VANCOPEAK, VANCORANDOM, GENTTROUGH, GENTPEAK, GENTRANDOM, TOBRATROUGH, TOBRAPEAK, TOBRARND, AMIKACINPEAK, AMIKACINTROU, AMIKACIN in the last 72 hours.   Microbiology: No results found for this or any previous visit (from the past 720 hour(s)).  Medical History: Past Medical History  Diagnosis Date  . Diabetes mellitus without complication     followed by Dr Chalmers Cater  . Hypertension     followed by [facility]    Medications:  Anti-infectives    Start     Dose/Rate Route Frequency Ordered Stop   02/16/15 2000  vancomycin (VANCOCIN) IVPB 1000 mg/200 mL premix     1,000 mg 200 mL/hr over 60 Minutes Intravenous Every 48 hours 02/14/15 1802     02/15/15 0000  piperacillin-tazobactam (ZOSYN) IVPB 2.25 g     2.25 g 100 mL/hr over 30 Minutes Intravenous 4 times per day 02/14/15 1802     02/14/15 1800  vancomycin (VANCOCIN) 1,500 mg in sodium chloride 0.9 % 500 mL IVPB     1,500 mg 250 mL/hr over 120 Minutes Intravenous  Once 02/14/15 1759     02/14/15 1800  piperacillin-tazobactam (ZOSYN) IVPB 3.375 g     3.375 g 100 mL/hr over 30 Minutes Intravenous  Once 02/14/15 1759       Assessment: 73 yom presented to the ED  with SOB and hemoptysis requiring intubation. To start empiric broad-spectrum antibiotics for possible pneumonia. Pt is afebrile but WBC is elevated at 209. Scr is also elevated at 4.5 with a known history of CKD. Lactic acid is elevated at 5.13.   Vanc 9/27>> Zosyn 9/27>>  Goal of Therapy:  Vancomycin trough level 15-20 mcg/ml  Plan:  - Zosyn 3.375gm IV x 1 then 2.25gm IV Q6H - Vanc 1500mg  IV x 1 then 1gm IV Q48H - F/u renal fxn, C&S, clinical status and trough at [place], Rande Lawman 02/14/2015,6:03 PM

## 2015-02-14 NOTE — Procedures (Signed)
Bronchoscopy Procedure Note LONZY BABIARZ [id number] 1961/04/21  Procedure: Bronchoscopy Indications: Diagnostic evaluation of the airways, Obtain specimens for culture and/or other diagnostic studies and Remove secretions  Procedure Details Consent: Risks of procedure as well as the alternatives and risks of each were explained to the (patient/caregiver).  Consent for procedure obtained. Time Out: Verified patient identification, verified procedure, site/side was marked, verified correct patient position, special equipment/implants available, medications/allergies/relevent history reviewed, required imaging and test results available.  Performed  In preparation for procedure, patient was given 100% FiO2 and bronchoscope lubricated. Sedation: Benzodiazepines, Muscle relaxants and fentanyl  Airway entered and the following bronchi were examined: RUL, RML, RLL, LUL, LLL and Bronchi.   No obvious site of bleeding noted.  BAL from RML. Bronchoscope removed.  , Patient placed back on 100% FiO2 at conclusion of procedure.    Evaluation Hemodynamic Status: BP stable throughout; O2 sats: stable throughout Patient's Current Condition: stable Specimens:  Sent serosanguinous fluid Complications: No apparent complications Patient did tolerate procedure well.   [name] 02/14/2015

## 2015-02-15 ENCOUNTER — Inpatient Hospital Stay (HOSPITAL_COMMUNITY): Payer: BLUE CROSS/BLUE SHIELD

## 2015-02-15 DIAGNOSIS — N189 Chronic kidney disease, unspecified: Secondary | ICD-10-CM

## 2015-02-15 DIAGNOSIS — E872 Acidosis: Secondary | ICD-10-CM

## 2015-02-15 DIAGNOSIS — N179 Acute kidney failure, unspecified: Secondary | ICD-10-CM

## 2015-02-15 DIAGNOSIS — R079 Chest pain, unspecified: Secondary | ICD-10-CM

## 2015-02-15 DIAGNOSIS — E875 Hyperkalemia: Secondary | ICD-10-CM

## 2015-02-15 LAB — GLUCOSE, CAPILLARY
GLUCOSE-CAPILLARY: 104 mg/dL — AB (ref 65–99)
GLUCOSE-CAPILLARY: 118 mg/dL — AB (ref 65–99)
GLUCOSE-CAPILLARY: 125 mg/dL — AB (ref 65–99)
GLUCOSE-CAPILLARY: 127 mg/dL — AB (ref 65–99)
GLUCOSE-CAPILLARY: 132 mg/dL — AB (ref 65–99)
GLUCOSE-CAPILLARY: 135 mg/dL — AB (ref 65–99)
GLUCOSE-CAPILLARY: 142 mg/dL — AB (ref 65–99)
GLUCOSE-CAPILLARY: 154 mg/dL — AB (ref 65–99)
GLUCOSE-CAPILLARY: 155 mg/dL — AB (ref 65–99)
GLUCOSE-CAPILLARY: 185 mg/dL — AB (ref 65–99)
GLUCOSE-CAPILLARY: 188 mg/dL — AB (ref 65–99)
GLUCOSE-CAPILLARY: 192 mg/dL — AB (ref 65–99)
GLUCOSE-CAPILLARY: 207 mg/dL — AB (ref 65–99)
GLUCOSE-CAPILLARY: 263 mg/dL — AB (ref 65–99)
GLUCOSE-CAPILLARY: 384 mg/dL — AB (ref 65–99)
GLUCOSE-CAPILLARY: 388 mg/dL — AB (ref 65–99)
Glucose-Capillary: 127 mg/dL — ABNORMAL HIGH (ref 65–99)
Glucose-Capillary: 132 mg/dL — ABNORMAL HIGH (ref 65–99)
Glucose-Capillary: 132 mg/dL — ABNORMAL HIGH (ref 65–99)
Glucose-Capillary: 170 mg/dL — ABNORMAL HIGH (ref 65–99)
Glucose-Capillary: 171 mg/dL — ABNORMAL HIGH (ref 65–99)
Glucose-Capillary: 177 mg/dL — ABNORMAL HIGH (ref 65–99)
Glucose-Capillary: 195 mg/dL — ABNORMAL HIGH (ref 65–99)
Glucose-Capillary: 249 mg/dL — ABNORMAL HIGH (ref 65–99)
Glucose-Capillary: 338 mg/dL — ABNORMAL HIGH (ref 65–99)

## 2015-02-15 LAB — BLOOD GAS, ARTERIAL
ACID-BASE DEFICIT: 7.4 mmol/L — AB (ref 0.0–2.0)
ACID-BASE DEFICIT: 5.4 mmol/L — AB (ref 0.0–2.0)
Acid-base deficit: 7.4 mmol/L — ABNORMAL HIGH (ref 0.0–2.0)
BICARBONATE: 16.8 meq/L — AB (ref 20.0–24.0)
Bicarbonate: 17 mEq/L — ABNORMAL LOW (ref 20.0–24.0)
Bicarbonate: 19 mEq/L — ABNORMAL LOW (ref 20.0–24.0)
DRAWN BY: 441351
Drawn by: 441351
Drawn by: 441351
FIO2: 1
FIO2: 0.8
FIO2: 0.8
LHR: 22 {breaths}/min
MECHVT: 410 mL
O2 SAT: 99.5 %
O2 SAT: 99.7 %
O2 Saturation: 99.6 %
PATIENT TEMPERATURE: 98.6
PATIENT TEMPERATURE: 100.5
PCO2 ART: 29.6 mmHg — AB (ref 35.0–45.0)
PCO2 ART: 36.2 mmHg (ref 35.0–45.0)
PEEP/CPAP: 14 cmH2O
PEEP/CPAP: 14 cmH2O
PEEP: 14 cmH2O
PH ART: 7.357 (ref 7.350–7.450)
PH ART: 7.347 — AB (ref 7.350–7.450)
PO2 ART: 307 mmHg — AB (ref 80.0–100.0)
Patient temperature: 98.6
RATE: 22 resp/min
RATE: 22 resp/min
TCO2: 17.7 mmol/L (ref 0–100)
TCO2: 18 mmol/L (ref 0–100)
TCO2: 20.1 mmol/L (ref 0–100)
VT: 550 mL
VT: 550 mL
pCO2 arterial: 31.2 mmHg — ABNORMAL LOW (ref 35.0–45.0)
pH, Arterial: 7.373 (ref 7.350–7.450)
pO2, Arterial: 236 mmHg — ABNORMAL HIGH (ref 80.0–100.0)
pO2, Arterial: 227 mmHg — ABNORMAL HIGH (ref 80.0–100.0)

## 2015-02-15 LAB — URINE MICROSCOPIC-ADD ON

## 2015-02-15 LAB — BASIC METABOLIC PANEL
ANION GAP: 10 (ref 5–15)
Anion gap: 10 (ref 5–15)
Anion gap: 7 (ref 5–15)
BUN: 61 mg/dL — AB (ref 6–20)
BUN: 53 mg/dL — AB (ref 6–20)
BUN: 50 mg/dL — AB (ref 6–20)
CALCIUM: 8.1 mg/dL — AB (ref 8.9–10.3)
CHLORIDE: 109 mmol/L (ref 101–111)
CO2: 25 mmol/L (ref 22–32)
CO2: 21 mmol/L — AB (ref 22–32)
CO2: 19 mmol/L — AB (ref 22–32)
CREATININE: 5.47 mg/dL — AB (ref 0.61–1.24)
CREATININE: 5.11 mg/dL — AB (ref 0.61–1.24)
Calcium: 8.1 mg/dL — ABNORMAL LOW (ref 8.9–10.3)
Calcium: 8.3 mg/dL — ABNORMAL LOW (ref 8.9–10.3)
Chloride: 107 mmol/L (ref 101–111)
Chloride: 108 mmol/L (ref 101–111)
Creatinine, Ser: 5.37 mg/dL — ABNORMAL HIGH (ref 0.61–1.24)
GFR calc Af Amer: 13 mL/min — ABNORMAL LOW (ref >60)
GFR calc Af Amer: 12 mL/min — ABNORMAL LOW (ref >60)
GFR calc non Af Amer: 11 mL/min — ABNORMAL LOW (ref >60)
GFR calc non Af Amer: 11 mL/min — ABNORMAL LOW (ref >60)
GFR calc non Af Amer: 11 mL/min — ABNORMAL LOW (ref >60)
GFR, EST AFRICAN AMERICAN: 12 mL/min — AB (ref >60)
GLUCOSE: 134 mg/dL — AB (ref 65–99)
GLUCOSE: 344 mg/dL — AB (ref 65–99)
Glucose, Bld: 144 mg/dL — ABNORMAL HIGH (ref 65–99)
POTASSIUM: 5.3 mmol/L — AB (ref 3.5–5.1)
POTASSIUM: 5.6 mmol/L — AB (ref 3.5–5.1)
Potassium: 5.3 mmol/L — ABNORMAL HIGH (ref 3.5–5.1)
SODIUM: 140 mmol/L (ref 135–145)
Sodium: 138 mmol/L (ref 135–145)
Sodium: 138 mmol/L (ref 135–145)

## 2015-02-15 LAB — TYPE AND SCREEN
ABO/RH(D): O POS
ANTIBODY SCREEN: NEGATIVE

## 2015-02-15 LAB — URINALYSIS, ROUTINE W REFLEX MICROSCOPIC
Bilirubin Urine: NEGATIVE
GLUCOSE, UA: NEGATIVE mg/dL
Hgb urine dipstick: NEGATIVE
Ketones, ur: 15 mg/dL — AB
Nitrite: NEGATIVE
PH: 5 (ref 5.0–8.0)
Protein, ur: 100 mg/dL — AB
Specific Gravity, Urine: 1.019 (ref 1.005–1.030)
Urobilinogen, UA: 1 mg/dL (ref 0.0–1.0)

## 2015-02-15 LAB — CBC
HEMATOCRIT: 30.4 % — AB (ref 39.0–52.0)
Hemoglobin: 10.6 g/dL — ABNORMAL LOW (ref 13.0–17.0)
MCH: 29.3 pg (ref 26.0–34.0)
MCHC: 34.9 g/dL (ref 30.0–36.0)
MCV: 84 fL (ref 78.0–100.0)
Platelets: 142 10*3/uL — ABNORMAL LOW (ref 150–400)
RBC: 3.62 MIL/uL — AB (ref 4.22–5.81)
RDW: 13.4 % (ref 11.5–15.5)
WBC: 16.4 10*3/uL — AB (ref 4.0–10.5)

## 2015-02-15 LAB — ABO/RH: ABO/RH(D): O POS

## 2015-02-15 LAB — C-REACTIVE PROTEIN: CRP: 10 mg/dL — AB (ref <1.0)

## 2015-02-15 LAB — TROPONIN I
TROPONIN I: 5.5 ng/mL — AB (ref <0.031)
Troponin I: 11.78 ng/mL (ref <0.031)

## 2015-02-15 LAB — MAGNESIUM: Magnesium: 2 mg/dL (ref 1.7–2.4)

## 2015-02-15 LAB — PHOSPHORUS: Phosphorus: 1.5 mg/dL — ABNORMAL LOW (ref 2.5–4.6)

## 2015-02-15 LAB — MRSA PCR SCREENING: MRSA by PCR: NEGATIVE

## 2015-02-15 MED ORDER — FENTANYL CITRATE (PF) 100 MCG/2ML IJ SOLN
INTRAMUSCULAR | Status: AC
  Filled 2015-02-15: qty 2

## 2015-02-15 MED ORDER — ROCURONIUM BROMIDE 50 MG/5ML IV SOLN
50.0000 mg | Freq: Once | INTRAVENOUS | Status: AC
  Administered 2015-02-15: 50 mg via INTRAVENOUS

## 2015-02-15 MED ORDER — ETOMIDATE 2 MG/ML IV SOLN
INTRAVENOUS | Status: AC
  Filled 2015-02-15: qty 10

## 2015-02-15 MED ORDER — SODIUM PHOSPHATE 3 MMOLE/ML IV SOLN
15.0000 mmol | Freq: Once | INTRAVENOUS | Status: AC
  Administered 2015-02-15: 15 mmol via INTRAVENOUS
  Filled 2015-02-15: qty 5

## 2015-02-15 MED ORDER — FENTANYL CITRATE (PF) 100 MCG/2ML IJ SOLN
100.0000 ug | Freq: Once | INTRAMUSCULAR | Status: AC
  Administered 2015-02-15: 100 ug via INTRAVENOUS

## 2015-02-15 MED ORDER — SODIUM BICARBONATE 8.4 % IV SOLN
INTRAVENOUS | Status: DC
  Administered 2015-02-15 – 2015-02-16 (×2): via INTRAVENOUS
  Filled 2015-02-15 (×4): qty 150

## 2015-02-15 NOTE — Procedures (Signed)
Bronchoscopy Procedure Note AIMON BERNICK [id number] 03/03/1961  Procedure: Bronchoscopy Indications: Diagnostic evaluation of the airways and Obtain specimens for culture and/or other diagnostic studies  Procedure Details Consent: Risks of procedure as well as the alternatives and risks of each were explained to the (patient/caregiver).  Consent for procedure obtained. Time Out: Verified patient identification, verified procedure, site/side was marked, verified correct patient position, special equipment/implants available, medications/allergies/relevent history reviewed, required imaging and test results available.  Performed  In preparation for procedure, patient was given 100% FiO2 and bronchoscope lubricated. Sedation: Benzodiazepines, Muscle relaxants and Fentanyl  Airway entered and the following bronchi were examined: RUL, RML, RLL, LUL, Lingula and LLL.   BAL from RML subsequent aliquots progressively blood.  Bronchoscope removed.    Evaluation Hemodynamic Status: BP stable throughout; O2 sats: stable throughout Patient's Current Condition: stable Specimens:  Sent serosanguinous fluid Complications: No apparent complications Patient did tolerate procedure well.      [name] 02/15/2015

## 2015-02-15 NOTE — Progress Notes (Signed)
CRITICAL VALUE ALERT  Critical value received:  K > 7.5, Troponin 2.99   Date of notification:  02/14/15  Time of notification:  2149, 2152  Critical value read back: Yes   Nurse who received alert:  Olean Ree and Roswell Miners RN   MD notified (1st page):  MD Oletta Darter   Time of first page:  2159  MD notified (2nd page):  Time of second page:  Responding MD:  MD Oletta Darter   Time MD responded: 2200

## 2015-02-15 NOTE — Progress Notes (Signed)
PULMONARY / CRITICAL CARE MEDICINE   Name: Russell Price MRN: [id number] DOB: April 03, 1961    ADMISSION DATE:  02/14/2015 CONSULTATION DATE:  02/14/2015  REFERRING MD :  EDP Mackuen  CHIEF COMPLAINT:  SOB  INITIAL PRESENTATION: 54 year old [place] male with no cardiac history presented with SOB and hemoptysis. CXR showed R>L edema. He was intubated in ED for airway protection in setting of copious bloody secretions. PCCM to admit.   STUDIES:  CT chest 9/27 - reviewed by me. Patchy bilateral consolidation. No pathologic mediastinal LAD. Portable CXR 9/28 - ETT in good position & Right IJ in good position. Bilateral opacities R>L. Renal U/S 9/28 - No hydronephrosis or solid mass. TTE 9/28 - Pending  SIGNIFICANT EVENTS: 9/27 - Admitted to hospital 9/27 - Intubated & central line placed. Attempted BAL. 9/28 - BAL by [facility] Nephrology consulted  SUBJECTIVE: No acute events overnight. Hyperkalemia improving. Remains on ventilator and sedated.  REVIEW OF SYSTEMS:  Unable to obtain as patient is intubated.  VITAL SIGNS: Temp:  [98.2 F (36.8 C)-100.5 F (38.1 C)] 99 F (37.2 C) (09/28 0756) Pulse Rate:  [58-123] 67 (09/28 0818) Resp:  [13-40] 22 (09/28 0818) BP: (91-188)/(53-98) 106/65 mmHg (09/28 0818) SpO2:  [68 %-100 %] 100 % (09/28 0818) FiO2 (%):  [60 %-100 %] 70 % (09/28 0818) Weight:  [184 lb 11.9 oz (83.8 kg)-187 lb 9.8 oz (85.1 kg)] 187 lb 9.8 oz (85.1 kg) (09/28 0500) HEMODYNAMICS: CVP:  [6 mmHg-14 mmHg] 7 mmHg VENTILATOR SETTINGS: Vent Mode:  [-] PRVC FiO2 (%):  [60 %-100 %] 70 % Set Rate:  [10 bmp-22 bmp] 22 bmp Vt Set:  [410 mL-550 mL] 410 mL PEEP:  [7 cmH20-14 cmH20] 12 cmH20 Plateau Pressure:  [20 cmH20-32 cmH20] 23 cmH20 INTAKE / OUTPUT:  Intake/Output Summary (Last 24 hours) at 02/15/15 1037 Last data filed at 02/15/15 0900  Gross per 24 hour  Intake 661.05 ml  Output    215 ml  Net 446.05 ml    PHYSICAL EXAMINATION: General:  Sedated. Family at  bedside. No distress.  Integument:  Warm & dry. No rash on exposed skin. No bruising. HEENT:  No scleral injection or icterus. Endotracheal tube in place.  Cardiovascular:  Regular rate. No edema. No appreciable JVD.  Pulmonary:  Coarse breath sounds bilaterally. Symmetric chest wall rise.  Abdomen: Soft. Normal bowel sounds. Nondistended.  Musculoskeletal:  Normal bulk and tone. No joint deformity, synovial thickening, or effusion appreciated.   LABS:  CBC  Recent Labs Lab 02/14/15 1604 02/14/15 1617 02/14/15 2114 02/15/15 0450  WBC 20.9*  --  12.5* 16.4*  HGB 11.5* 12.2* 10.7* 10.6*  HCT 33.7* 36.0* 31.6* 30.4*  PLT 208  --  145* 142*   Coag's  Recent Labs Lab 02/14/15 2114  INR 1.23   BMET  Recent Labs Lab 02/14/15 2114 02/15/15 0025 02/15/15 0450  NA 132* 138 138  K >7.5* 5.6* 5.3*  CL 104 109 107  CO2 19* 19* 21*  BUN 47* 50* 53*  CREATININE 4.75* 5.11* 5.37*  GLUCOSE 440* 344* 134*   Electrolytes  Recent Labs Lab 02/14/15 2114 02/15/15 0025 02/15/15 0450  CALCIUM 7.5* 8.1* 8.3*  MG 1.7  --  2.0  PHOS 3.3  --  1.5*   Sepsis Markers  Recent Labs Lab 02/11/15 2343 02/14/15 1619 02/14/15 2118 02/14/15 2122  LATICACIDVEN 0.51 5.13*  --  3.3*  PROCALCITON  --   --  7.12  --    ABG  Recent Labs Lab 02/14/15 1837 02/15/15 0305 02/15/15 0455  PHART 7.260* 7.357 7.347*  PCO2ART 36.5 31.2* 36.2  PO2ART 86.0 227* 307*   Liver Enzymes  Recent Labs Lab 02/14/15 1604  AST 36  ALT 19  ALKPHOS 92  BILITOT 1.0  ALBUMIN 3.1*   Cardiac Enzymes  Recent Labs Lab 02/14/15 2118 02/14/15 2300 02/15/15 0520  TROPONINI 2.99* 5.50* 11.78*   Glucose  Recent Labs Lab 02/15/15 0431 02/15/15 0520 02/15/15 0640 02/15/15 0755 02/15/15 0928 02/15/15 1024  GLUCAP 127* 118* 185* 192* 135* 125*    Imaging Ct Chest Wo Contrast  02/14/2015   CLINICAL DATA:  Hemoptysis for 1 day with profound dyspnea and tachycardia. History of  hypertension and diabetes. Adult respiratory distress syndrome. Initial encounter.  EXAM: CT CHEST WITHOUT CONTRAST  TECHNIQUE: Multidetector CT imaging of the chest was performed following the standard protocol without IV contrast.  COMPARISON:  Radiographs 02/14/2015 and 01/03/2014.  FINDINGS: Mediastinum/Nodes: There are mildly prominent mediastinal lymph nodes, including a 9 mm right paratracheal node on image number 20 and a 10 mm precarinal node on image 23. Hilar evaluation limited by the lack of intravenous contrast and adjacent airspace opacities. No axillary adenopathy.The patient is intubated. The endotracheal tube terminates in the distal trachea above the carina. The thyroid gland, trachea and esophagus demonstrate no significant findings. The heart appears mildly enlarged. There is no pericardial effusion. Mild coronary artery calcifications are noted.  Lungs/Pleura: As demonstrated radiographically, there are extensive bilateral perihilar airspace opacities with air bronchograms. There is consolidation dependently in both lower lobes. No endobronchial lesion, parenchymal necrosis or significant pleural effusion identified.  Upper abdomen: The stomach appears mildly distended. Otherwise unremarkable.  Musculoskeletal/Chest wall: There is no chest wall mass or suspicious osseous finding. There is a small amount of gas inferior to the left clavicle from presumed attempted central line placement. No pneumothorax or pneumomediastinum. Mild bilateral gynecomastia noted.  IMPRESSION: 1. Bilateral perihilar infiltrates with air bronchograms and dependent consolidation. Findings may be secondary to pneumonia, ARDS, hemorrhage or a combination thereof. 2. Mildly prominent mediastinal lymph nodes, likely reactive. 3. No significant pleural effusion. 4. Coronary artery calcifications.   Electronically Signed   By: Richardean Sale M.D.   On: 02/14/2015 17:52   US Renal  02/15/2015   CLINICAL DATA:  Acute kidney  injury  EXAM: RENAL / URINARY TRACT ULTRASOUND COMPLETE  COMPARISON:  None.  FINDINGS: Right Kidney:  Length: 12 cm. Borderline echogenic cortex. Mild and nonspecific perinephric edema. No mass or hydronephrosis visualized.  Left Kidney:  Length: 12 cm. Borderline echogenic cortex. 11 mm upper pole cyst with possible thin septation. No solid mass or hydronephrosis visualized.  Bladder:  Not visualized with report of Foley catheter.  Possibility of gallbladder sludge, without visualized wall thickening.  IMPRESSION: No hydronephrosis.   Electronically Signed   By: Monte Fantasia M.D.   On: 02/15/2015 04:12   Dg Chest Port 1 View  02/15/2015   CLINICAL DATA:  Evaluate airway  EXAM: PORTABLE CHEST 1 VIEW  COMPARISON:  02/14/2015  FINDINGS: Support devices are stable. There is cardiomegaly. Bilateral airspace opacities are noted, right greater than left. No visible effusions. No acute bony abnormality.  IMPRESSION: Continued bilateral airspace disease, right greater than left, not significantly changed. This could represent asymmetric edema or infection.   Electronically Signed   By: Rolm Baptise M.D.   On: 02/15/2015 07:19   Dg Chest Port 1 View  02/14/2015   CLINICAL DATA:  Status  post central line placement  EXAM: PORTABLE CHEST 1 VIEW  COMPARISON:  February 14, 2015 10:59 a.m.  FINDINGS: Right jugular central venous line is identified in the superior vena cava. Endotracheal tube is identified with distal tip 5.6 cm from carina. Nasogastric tube is identified distal tip not included but is at least in the stomach. There are airspace opacities throughout both lungs, right greater than left unchanged compared to prior exam. No definite pleural line is identified to suggest pneumothorax. The bony structures stable.  IMPRESSION: Right jugular central venous line distal tip in the superior vena cava. Endotracheal tube in good position. There is no pneumothorax.   Electronically Signed   By: Abelardo Diesel M.D.    On: 02/14/2015 19:48   Dg Chest Portable 1 View  02/14/2015   CLINICAL DATA:  Labored breathing.  EXAM: PORTABLE CHEST 1 VIEW  COMPARISON:  None.  FINDINGS: The patient has extensive bilateral airspace disease, worse on the right. No pleural effusion or pneumothorax. Heart size is upper normal.  IMPRESSION: Extensive bilateral airspace disease could be due to asymmetric edema, pneumonia or possibly pulmonary hemorrhage.   Electronically Signed   By: Inge Rise M.D.   On: 02/14/2015 16:25   Dg Abd Portable 1v  02/14/2015   CLINICAL DATA:  Encounter for nasogastric tube placement.  EXAM: PORTABLE ABDOMEN - 1 VIEW  COMPARISON:  Chest radiograph at 1938 hr  FINDINGS: Tip and side port of the enteric tube below the diaphragm in the stomach. No dilated bowel loops to suggest obstruction. Dense consolidation in the right lung with paucity of lung markings peripherally, suboptimally assessed on current exam.  IMPRESSION: Tip and side port of the enteric tube below the diaphragm in the stomach.   Electronically Signed   By: Jeb Levering M.D.   On: 02/14/2015 22:28     ASSESSMENT / PLAN:  PULMONARY OETT 9/27 >>> A: Acute hypoxic respiratory failure - DAH vs Pneumonia Hemoptysis/Probable DAH  P:   Full vent support Reattempt bronchoscopy today ANCA, ANA, RF, & Anti-CCP See ID  CARDIOVASCULAR A:  Sinus tachycardia Hypertensive emergency Elevated troponin - likely demand ischemia  P:  Telemetry  Continue to trend troponin daily TTE pending  RENAL A:   Acute on Chronic Renal Failure Anion Gap metabolic Acidosis Lactic Acidosis - improving Hyperkalemia - improving  P:   Gentle hydration Repeat LA tomorrow AM Following Electrolytes daily Bicarb gtt Nephrology consulted  GASTROINTESTINAL A:   No acute issues  P:   NPO for now Protonix IV daily  HEMATOLOGIC A:   Anemia - Suspect diffuse alveolar hemorrhage Leukocytosis - possibly infectious in etiology  P:   Follow Hgb w/ CBC daily Transfuse per ICU guidelines SCDs  INFECTIOUS A:   HCAP vs atypical pneumonia Leukocytosis  P:   BCx2 9/27 >>> UC 9/27 >>> U strep >>>neg U legionella >>>  Sputum BAL 9/27>>> AFB stain and cultures>  Fungal Cx 9/27 >>> Cytology 9/27 >  Abx: Zosyn, start date 9/27 >>> Abx: Vancomycin, start date 9/27 >>>   ENDOCRINE A:   H/O DM-2  P:   Continuing accuchecks q4hr with instructions to notify MD for BG <80 or >160  NEUROLOGIC A:   Acute Encephalopathy Sedation needs on Ventilator  P:   RASS goal: 0 to -1 Propofol infusion PRN fentanyl   FAMILY  - Updates: Family updated by Dr. Nelda Marseille at beside today. I updated patient's brother via phone.  - Inter-disciplinary family meet or Palliative Care meeting  due by:  10/4  TODAY'S SUMMARY:  Patient with probable pulmonary renal syndrome. Concerned about possible pneumonia given result of culture. Awaiting serologies & further culture data. Brother updated by me via phone.  I have spent a total of 31 minutes of critical care time caring for the patient, contacting nephrology for consultation, & reviewing the patient's EMR.  Sonia Baller Ashok Cordia, M.D. [facility] Pulmonary & Critical Care Pager:  [phone] After 3pm or if no response, call [phone] 02/15/2015 10:37 AM

## 2015-02-15 NOTE — Progress Notes (Signed)
eLink Physician-Brief Progress Note Patient Name: ELLISON SACHDEVA DOB: 04-26-1961 MRN: [id number]   Date of Service  02/15/2015  HPI/Events of Note  k better  eICU Interventions  Bicarb to prevent k rise further     Intervention Category Major Interventions: Electrolyte abnormality - evaluation and management  FEINSTEIN,DANIEL J. 02/15/2015, 1:23 AM

## 2015-02-15 NOTE — Progress Notes (Signed)
  Echocardiogram 2D Echocardiogram has been performed.  Russell Price 02/15/2015, 9:34 AM

## 2015-02-15 NOTE — Progress Notes (Signed)
Pt not given kayexalate in ED due around 1745. OG not been verified as well. MD Oletta Darter made aware. ABD Portable xray order for verification. Kayexalate 30 g will be given as well as aspirin via OG tube after verification of placement.

## 2015-02-15 NOTE — Progress Notes (Signed)
Initial Nutrition Assessment  DOCUMENTATION CODES:   Not applicable  INTERVENTION:    If unable to extubate within the next 24 hours, recommend initiate TF via OGT with Vital High Protein at 25 ml/h and Prostat 30 ml BID on day 1; on day 2, d/c Prostat and increase to goal rate of 70 ml/h (1680 ml per day) to provide 1680 kcals, 147 gm protein, 1404 ml free water daily. TF + kcals from propofol would provide 2073 kcals (100% of estimated needs).  NUTRITION DIAGNOSIS:   Inadequate oral intake related to inability to eat as evidenced by NPO status.  GOAL:   Patient will meet greater than or equal to 90% of their needs  MONITOR:   Vent status, Labs, Weight trends, I & O's  REASON FOR ASSESSMENT:   Ventilator    ASSESSMENT:   54 year old [place] male with no cardiac history presented with SOB and hemoptysis. CXR showed R>L edema. He was intubated in ED for airway protection in setting of copious bloody secretions.   Labs reviewed: potassium, BUN, creatinine elevated; phosphorus low. Patient currently receiving bronchoscopy, unable to complete nutrition focused physical exam. Nephrology has been consulted.   Patient is currently intubated on ventilator support MV: 9.5 L/min Temp (24hrs), Avg:99.6 F (37.6 C), Min:98.2 F (36.8 C), Max:100.5 F (38.1 C)  Propofol: 14.9 ml/hr providing 393 kcals per day.  Diet Order:  Diet NPO time specified  Skin:  Reviewed, no issues  Last BM:  unknown  Height:   Ht Readings from Last 1 Encounters:  02/14/15 5\' 10"  (1.778 m)    Weight:   Wt Readings from Last 1 Encounters:  02/15/15 187 lb 9.8 oz (85.1 kg)    Ideal Body Weight:  75.5 kg  BMI:  Body mass index is 26.92 kg/(m^2).  Estimated Nutritional Needs:   Kcal:  2068  Protein:  120-135 gm  Fluid:  2.1 L  EDUCATION NEEDS:   No education needs identified at this time  Molli Barrows, [place], [place], [place] Pager [phone] After Hours Pager [phone]

## 2015-02-15 NOTE — Progress Notes (Signed)
   02/14/15 1645  Clinical Encounter Type  Visited With Family  Visit Type Spiritual support  Referral From Nurse  Spiritual Encounters  Spiritual Needs Emotional  Stress Factors  Family Stress Factors Health changes  Chaplain Charlynn Grimes took over from [place] at shift change. Patient from [place]; doctor on ED at time spoke Arabic and translated information for family. Patient's cousin came in with patient and left to take her small child home. Other family soon arrived, and she returned. Chaplain was summoned about 90 minutes later to accompany family to 62M.

## 2015-02-15 NOTE — Consult Note (Signed)
[facility] CONSULT NOTE    Date: 02/15/2015                  Patient Name:  Russell Price  MRN: [id number]  DOB: 11-19-1960  Age / Sex: 54 y.o., male         PCP: No PCP Per Patient                 Service Requesting Consult: PCCM                 Reason for Consult: AKI on CKD Stage IV            History of Present Illness: Russell Price  is a 54 y.o. [place] man with a PMHx of HTN, type 2 DM, and CKD Stage IV who was admitted to [facility] on 02/14/2015 for evaluation of shortness of breath and hemoptysis. He had mild hemoptysis on evening of 9/26 which continues into the morning which prompted him to go to the ED. While in the ED he developed dyspnea and tachycardia and initially placed on BiPAP, but with his increased work of breathing was intubated. He was then admitted by PCCM. CT Chest showed diffuse infiltrates concerning for diffuse alveolar hemorrhage and he was started on broad spectrum antibiotics and steroids. Bronchoscopy performed today (9/28) and significant amount of serosanguinous fluid obtained. Sent for culture.   Patient found to have an AKI on admission with Cr 4.28. Baseline Cr ~2.6-3.0 over past year. Cr has continued to rise from 4.28>4.50>4.75>5.11>5.37. UOP recorded as 215 ml since admission. He has been hyperkalemic as well with K 5.6 on admission and then as high as >7.5 on 9/27. He received 30g Kayexalate and K improved to 5.6>5.3. UA shows 3-6 RBCs, 3-6 WBCs, granular casts, and 100 mg/dL protein. Renal ultrasound on 9/28 shows no evidence of hydronephrosis. Right kidney is 12 cm and left kidney is 12 cm with a 53mm upper pole cyst present.    Medications: Outpatient medications: Prescriptions prior to admission  Medication Sig Dispense Refill Last Dose  . amLODipine (NORVASC) 10 MG tablet Take 1 tablet (10 mg total) by mouth daily. Stop lisinopril . 30 tablet 5 02/14/2015 at Unknown time  . glipiZIDE (GLUCOTROL) 5 MG tablet Take 2 pills in the  morning and one in the evening for diabetes 90 tablet 2 02/14/2015 at Unknown time  . Insulin Glargine (LANTUS SOLOSTAR) 100 UNIT/ML Solostar Pen Inject 50 units subcutaneously at bedtime. 15 mL 0 02/13/2015 at Unknown time    Current medications: Current Facility-Administered Medications  Medication Dose Route Frequency Provider Last Rate Last Dose  . 0.9 %  sodium chloride infusion  250 mL Intravenous PRN Corey Harold, NP      . 0.9 %  sodium chloride infusion  250 mL Intravenous PRN Corey Harold, NP      . antiseptic oral rinse solution (CORINZ)  7 mL Mouth Rinse QID Corey Harold, NP   7 mL at 02/15/15 1100  . aspirin tablet 325 mg  325 mg Oral Daily Anders Simmonds, MD   325 mg at 02/15/15 0933  . chlorhexidine gluconate (PERIDEX) 0.12 % solution 15 mL  15 mL Mouth Rinse BID Corey Harold, NP   15 mL at 02/15/15 0800  . fentaNYL (SUBLIMAZE) injection 100 mcg  100 mcg Intravenous Q15 min PRN Corey Harold, NP      . fentaNYL (SUBLIMAZE) injection 100 mcg  100 mcg Intravenous Q2H PRN  Corey Harold, NP      . hydrALAZINE (APRESOLINE) injection 10-40 mg  10-40 mg Intravenous Q4H PRN Corey Harold, NP      . insulin regular (NOVOLIN R,HUMULIN R) 250 Units in sodium chloride 0.9 % 250 mL (1 Units/mL) infusion   Intravenous Continuous Anders Simmonds, MD 1.3 mL/hr at 02/15/15 1028 1.3 Units/hr at 02/15/15 1028  . methylPREDNISolone sodium succinate (SOLU-MEDROL) 125 mg/2 mL injection 125 mg  125 mg Intravenous Q6H Corey Harold, NP   125 mg at 02/15/15 0932  . pantoprazole (PROTONIX) injection 40 mg  40 mg Intravenous QHS Corey Harold, NP   40 mg at 02/14/15 2206  . piperacillin-tazobactam (ZOSYN) IVPB 2.25 g  2.25 g Intravenous 4 times per day Para March, RPH   2.25 g at 02/15/15 0508  . piperacillin-tazobactam (ZOSYN) IVPB 3.375 g  3.375 g Intravenous Once Rachel L Rumbarger, RPH      . propofol (DIPRIVAN) 10 mg/mL bolus/IV push   Intravenous Continuous PRN Rush Farmer, MD    Stopped at 02/14/15 1813  . propofol (DIPRIVAN) 1000 MG/100ML infusion  0-50 mcg/kg/min Intravenous Continuous Corey Harold, NP 15.1 mL/hr at 02/15/15 1058 30.468 mcg/kg/min at 02/15/15 1058  . rocuronium (ZEMURON) injection   Intravenous PRN Courteney Lyn Mackuen, MD   50 mg at 02/14/15 1703  . sodium bicarbonate 150 mEq in dextrose 5 % 1,000 mL infusion   Intravenous Continuous Raylene Miyamoto, MD 50 mL/hr at 02/15/15 0201    . sodium chloride 0.9 % injection 3 mL  3 mL Intravenous Q12H Corey Harold, NP   3 mL at 02/15/15 0933  . sodium chloride 0.9 % injection 3 mL  3 mL Intravenous PRN Corey Harold, NP      . Derrill Memo ON 02/16/2015] vancomycin (VANCOCIN) IVPB 1000 mg/200 mL premix  1,000 mg Intravenous Q48H Rachel L Rumbarger, RPH          Allergies: No Known Allergies    Past Medical History: Past Medical History  Diagnosis Date  . Diabetes mellitus without complication     followed by Dr Chalmers Cater  . Hypertension     followed by [facility]     Past Surgical History: History reviewed. No pertinent past surgical history.   Family History: Family History  Problem Relation Age of Onset  . Diabetes Mother      Social History: Social History   Social History  . Marital Status: Single    Spouse Name: N/A  . Number of Children: N/A  . Years of Education: N/A   Occupational History  . Not on file.   Social History Main Topics  . Smoking status: Former Smoker    Quit date: 02/23/1983  . Smokeless tobacco: Not on file  . Alcohol Use: No  . Drug Use: No  . Sexual Activity: Not on file   Other Topics Concern  . Not on file   Social History Narrative     Review of Systems: Unable to perform ROS due to patient being sedated and ventilated   Vital Signs: Blood pressure 106/65, pulse 67, temperature 99 F (37.2 C), temperature source Oral, resp. rate 22, height 5\' 10"  (1.778 m), weight 187 lb 9.8 oz (85.1 kg), SpO2 100 %.  Weight  trends: Filed Weights   02/14/15 2000 02/15/15 0500  Weight: 184 lb 11.9 oz (83.8 kg) 187 lb 9.8 oz (85.1 kg)    Physical Exam: General: middle aged man  sedated and on vent HEENT: [place]/AT, PERRL, sclera anicteric, ETT in place CV: RRR, no m/g/r Pulm: coarse breath sounds bilaterally Abd: BS+, soft, non-distended Ext: warm, 1+ peripheral edema  Neuro: sedated on vent  Skin: no rashes, bruising, or skin lesions   Lab results: Basic Metabolic Panel:  Recent Labs Lab 02/14/15 2114 02/15/15 0025 02/15/15 0450  NA 132* 138 138  K >7.5* 5.6* 5.3*  CL 104 109 107  CO2 19* 19* 21*  GLUCOSE 440* 344* 134*  BUN 47* 50* 53*  CREATININE 4.75* 5.11* 5.37*  CALCIUM 7.5* 8.1* 8.3*  MG 1.7  --  2.0  PHOS 3.3  --  1.5*    Liver Function Tests:  Recent Labs Lab 02/14/15 1604  AST 36  ALT 19  ALKPHOS 92  BILITOT 1.0  PROT 6.6  ALBUMIN 3.1*   No results for input(s): LIPASE, AMYLASE in the last 168 hours. No results for input(s): AMMONIA in the last 168 hours.  CBC:  Recent Labs Lab 02/11/15 2333 02/14/15 1604 02/14/15 1617 02/14/15 2114 02/15/15 0450  WBC 7.9 20.9*  --  12.5* 16.4*  NEUTROABS  --  19.5*  --   --   --   HGB 11.4* 11.5* 12.2* 10.7* 10.6*  HCT 32.3* 33.7* 36.0* 31.6* 30.4*  MCV 82.4 83.4  --  84.0 84.0  PLT 154 208  --  145* 142*    Cardiac Enzymes:  Recent Labs Lab 02/14/15 2118 02/14/15 2300 02/15/15 0520  TROPONINI 2.99* 5.50* 11.78*    BNP: Invalid input(s): POCBNP  CBG:  Recent Labs Lab 02/15/15 0520 02/15/15 0640 02/15/15 0755 02/15/15 0928 02/15/15 1024  GLUCAP 118* 185* 192* 135* 125*    Microbiology: Results for orders placed or performed during the hospital encounter of 02/14/15  Culture, bal-quantitative     Status: None (Preliminary result)   Collection Time: 02/14/15  5:19 PM  Result Value Ref Range Status   Specimen Description BRONCHIAL ALVEOLAR LAVAGE  Final   Special Requests NONE  Final   Gram Stain   Final     RARE WBC PRESENT, PREDOMINANTLY PMN NO SQUAMOUS EPITHELIAL CELLS SEEN NO ORGANISMS SEEN Performed at [facility]    Culture PENDING  Incomplete   Report Status PENDING  Incomplete  Gram stain     Status: None   Collection Time: 02/14/15  5:19 PM  Result Value Ref Range Status   Specimen Description BRONCHIAL ALVEOLAR LAVAGE  Final   Special Requests NONE  Final   Gram Stain   Final    RARE WBC PRESENT,BOTH PMN AND MONONUCLEAR NO ORGANISMS SEEN    Report Status 02/14/2015 FINAL  Final  MRSA PCR Screening     Status: None   Collection Time: 02/14/15  8:05 PM  Result Value Ref Range Status   MRSA by PCR NEGATIVE NEGATIVE Final    Comment:        The GeneXpert MRSA Assay (FDA approved for NASAL specimens only), is one component of a comprehensive MRSA colonization surveillance program. It is not intended to diagnose MRSA infection nor to guide or monitor treatment for MRSA infections.   Gram stain     Status: None   Collection Time: 02/14/15  9:31 PM  Result Value Ref Range Status   Specimen Description BRONCHIAL ALVEOLAR LAVAGE  Final   Special Requests NONE  Final   Gram Stain   Final    RARE WBC PRESENT, PREDOMINANTLY PMN NO ORGANISMS SEEN    Report Status 02/14/2015  FINAL  Final  Culture, respiratory (NON-Expectorated)     Status: None (Preliminary result)   Collection Time: 02/14/15  9:31 PM  Result Value Ref Range Status   Specimen Description BRONCHIAL ALVEOLAR LAVAGE  Final   Special Requests ADDED ON 02/15/15  Final   Gram Stain   Final    FEW WBC PRESENT, PREDOMINANTLY PMN RARE SQUAMOUS EPITHELIAL CELLS PRESENT RARE GRAM POSITIVE COCCI IN PAIRS Performed at [facility]    Culture PENDING  Incomplete   Report Status PENDING  Incomplete    Coagulation Studies:  Recent Labs  02/14/15 2114  LABPROT 15.7*  INR 1.23    Urinalysis:  Recent Labs  02/14/15 2130  COLORURINE YELLOW  LABSPEC 1.018  PHURINE 5.0  GLUCOSEU 250*   HGBUR SMALL*  BILIRUBINUR NEGATIVE  KETONESUR 15*  PROTEINUR >300*  UROBILINOGEN 0.2  NITRITE NEGATIVE  LEUKOCYTESUR NEGATIVE      Imaging: Ct Chest Wo Contrast  02/14/2015   CLINICAL DATA:  Hemoptysis for 1 day with profound dyspnea and tachycardia. History of hypertension and diabetes. Adult respiratory distress syndrome. Initial encounter.  EXAM: CT CHEST WITHOUT CONTRAST  TECHNIQUE: Multidetector CT imaging of the chest was performed following the standard protocol without IV contrast.  COMPARISON:  Radiographs 02/14/2015 and 01/03/2014.  FINDINGS: Mediastinum/Nodes: There are mildly prominent mediastinal lymph nodes, including a 9 mm right paratracheal node on image number 20 and a 10 mm precarinal node on image 23. Hilar evaluation limited by the lack of intravenous contrast and adjacent airspace opacities. No axillary adenopathy.The patient is intubated. The endotracheal tube terminates in the distal trachea above the carina. The thyroid gland, trachea and esophagus demonstrate no significant findings. The heart appears mildly enlarged. There is no pericardial effusion. Mild coronary artery calcifications are noted.  Lungs/Pleura: As demonstrated radiographically, there are extensive bilateral perihilar airspace opacities with air bronchograms. There is consolidation dependently in both lower lobes. No endobronchial lesion, parenchymal necrosis or significant pleural effusion identified.  Upper abdomen: The stomach appears mildly distended. Otherwise unremarkable.  Musculoskeletal/Chest wall: There is no chest wall mass or suspicious osseous finding. There is a small amount of gas inferior to the left clavicle from presumed attempted central line placement. No pneumothorax or pneumomediastinum. Mild bilateral gynecomastia noted.  IMPRESSION: 1. Bilateral perihilar infiltrates with air bronchograms and dependent consolidation. Findings may be secondary to pneumonia, ARDS, hemorrhage or a  combination thereof. 2. Mildly prominent mediastinal lymph nodes, likely reactive. 3. No significant pleural effusion. 4. Coronary artery calcifications.   Electronically Signed   By: Richardean Sale M.D.   On: 02/14/2015 17:52   US Renal  02/15/2015   CLINICAL DATA:  Acute kidney injury  EXAM: RENAL / URINARY TRACT ULTRASOUND COMPLETE  COMPARISON:  None.  FINDINGS: Right Kidney:  Length: 12 cm. Borderline echogenic cortex. Mild and nonspecific perinephric edema. No mass or hydronephrosis visualized.  Left Kidney:  Length: 12 cm. Borderline echogenic cortex. 11 mm upper pole cyst with possible thin septation. No solid mass or hydronephrosis visualized.  Bladder:  Not visualized with report of Foley catheter.  Possibility of gallbladder sludge, without visualized wall thickening.  IMPRESSION: No hydronephrosis.   Electronically Signed   By: Monte Fantasia M.D.   On: 02/15/2015 04:12   Dg Chest Port 1 View  02/15/2015   CLINICAL DATA:  Evaluate airway  EXAM: PORTABLE CHEST 1 VIEW  COMPARISON:  02/14/2015  FINDINGS: Support devices are stable. There is cardiomegaly. Bilateral airspace opacities are noted, right greater than  left. No visible effusions. No acute bony abnormality.  IMPRESSION: Continued bilateral airspace disease, right greater than left, not significantly changed. This could represent asymmetric edema or infection.   Electronically Signed   By: Rolm Baptise M.D.   On: 02/15/2015 07:19   Dg Chest Port 1 View  02/14/2015   CLINICAL DATA:  Status post central line placement  EXAM: PORTABLE CHEST 1 VIEW  COMPARISON:  February 14, 2015 10:59 a.m.  FINDINGS: Right jugular central venous line is identified in the superior vena cava. Endotracheal tube is identified with distal tip 5.6 cm from carina. Nasogastric tube is identified distal tip not included but is at least in the stomach. There are airspace opacities throughout both lungs, right greater than left unchanged compared to prior exam. No  definite pleural line is identified to suggest pneumothorax. The bony structures stable.  IMPRESSION: Right jugular central venous line distal tip in the superior vena cava. Endotracheal tube in good position. There is no pneumothorax.   Electronically Signed   By: Abelardo Diesel M.D.   On: 02/14/2015 19:48   Dg Chest Portable 1 View  02/14/2015   CLINICAL DATA:  Labored breathing.  EXAM: PORTABLE CHEST 1 VIEW  COMPARISON:  None.  FINDINGS: The patient has extensive bilateral airspace disease, worse on the right. No pleural effusion or pneumothorax. Heart size is upper normal.  IMPRESSION: Extensive bilateral airspace disease could be due to asymmetric edema, pneumonia or possibly pulmonary hemorrhage.   Electronically Signed   By: Inge Rise M.D.   On: 02/14/2015 16:25   Dg Abd Portable 1v  02/14/2015   CLINICAL DATA:  Encounter for nasogastric tube placement.  EXAM: PORTABLE ABDOMEN - 1 VIEW  COMPARISON:  Chest radiograph at 1938 hr  FINDINGS: Tip and side port of the enteric tube below the diaphragm in the stomach. No dilated bowel loops to suggest obstruction. Dense consolidation in the right lung with paucity of lung markings peripherally, suboptimally assessed on current exam.  IMPRESSION: Tip and side port of the enteric tube below the diaphragm in the stomach.   Electronically Signed   By: Jeb Levering M.D.   On: 02/14/2015 22:28      Assessment & Plan: Mr. Caris  is a 54 y.o. [place] man with a PMHx of HTN, type 2 DM, and CKD Stage IV who was admitted for evaluation of shortness of breath and hemoptysis likely secondary to [facility]. He also has worsening renal function on his baseline CKD Stage IV.  AKI on CKD Stage IV: Cr 4.28 on admission and has continued to rise to 5.47. Patient oliguric with UOP 215 ml since admission. His AKI is most likely due to an ischemic ATN secondary to sepsis. Obstruction was ruled out by renal ultrasound. Although he does have proteinuria, a  glomerulonephritis is unlikely as he does not have many RBCs in the urine (3-6). No rashes on exam and no history of fevers, rashes, myalgias, arthralgias per sister to suggest a vasculitis. Noted that primary team has ordered ANA and RF, will follow up. AIN is in differential as he had 3-6 WBCs, but would expect higher levels. No emergent dialysis needs, but will likely need to be started on CRRT in next few days if UOP/electrolytes do not improve.   Hemoptysis/Acute Respiratory Failure: Patient presented with 1-2 days of hemoptysis and dyspnea requiring intubation and ventilation. CT Chest concerning for diffuse alveolar hemorrhage and possibly a PNA. Bronchoscopy performed 9/28. BAL cultures pending, including AFB and fungal.  Blood cultures negative. Resp culture 9/27 with rare GPC in pairs. Currently on Vanc and Zosyn. Management per primary team.   Hyperkalemia: Patient hyperkalemic on admission at 5.6 and then increased to >7.5. He received Kayexalate 30 g x 1 and K improved to 5.6. K is currently 5.3. Continue to monitor.   Hypophosphatemia: Ph 3.3 on admission, currently 1.5. Will give sodium phosphate 15 mmol x 1. Recheck in AM.   Metabolic Acidosis: Bicarb 15 on admission, currently 25. Currently on sodium bicarb drip at 50 ml/hr.   Normocytic Anemia: Hgb 11.5 on admission, currently 10.6. Likely anemia of chronic disease given his CKD. Continue to monitor. Transfuse for Hgb < 7.0.   HTN: BPs [id number] systolic. Patient is on Amlodipine 10 mg daily at home. Currently has Hydralazine PRN for SBP > 180.   Type 2 DM: Last HbA1c 8.7 on 9/16. CBGs well controlled in hospital in 120s-130s mostly. He is on Glipizide 10 mg daily and Lantus 50 units QHS at home. Currently on insulin drip. Management per primary team.    DVT PPX - SCDs   Thank you for this interesting consult. Attending note to follow.   Albin Felling, MD, MPH Internal Medicine Resident, PGY-II Pager: [phone]

## 2015-02-16 LAB — TROPONIN I: Troponin I: 8.2 ng/mL (ref <0.031)

## 2015-02-16 LAB — LEGIONELLA PNEUMOPHILA SEROGP 1 UR AG: L. PNEUMOPHILA SEROGP 1 UR AG: NEGATIVE

## 2015-02-16 LAB — CBC WITH DIFFERENTIAL/PLATELET
BASOS PCT: 0 %
Basophils Absolute: 0 10*3/uL (ref 0.0–0.1)
EOS ABS: 0 10*3/uL (ref 0.0–0.7)
EOS PCT: 0 %
HCT: 24.9 % — ABNORMAL LOW (ref 39.0–52.0)
Hemoglobin: 8.6 g/dL — ABNORMAL LOW (ref 13.0–17.0)
LYMPHS ABS: 0.4 10*3/uL — AB (ref 0.7–4.0)
Lymphocytes Relative: 4 %
MCH: 28.9 pg (ref 26.0–34.0)
MCHC: 34.5 g/dL (ref 30.0–36.0)
MCV: 83.6 fL (ref 78.0–100.0)
MONO ABS: 0.2 10*3/uL (ref 0.1–1.0)
MONOS PCT: 2 %
NEUTROS PCT: 94 %
Neutro Abs: 9.5 10*3/uL — ABNORMAL HIGH (ref 1.7–7.7)
Platelets: 115 10*3/uL — ABNORMAL LOW (ref 150–400)
RBC: 2.98 MIL/uL — ABNORMAL LOW (ref 4.22–5.81)
RDW: 13.5 % (ref 11.5–15.5)
WBC: 10.1 10*3/uL (ref 4.0–10.5)

## 2015-02-16 LAB — RENAL FUNCTION PANEL
Albumin: 2.1 g/dL — ABNORMAL LOW (ref 3.5–5.0)
Anion gap: 12 (ref 5–15)
BUN: 77 mg/dL — AB (ref 6–20)
CALCIUM: 8 mg/dL — AB (ref 8.9–10.3)
CO2: 24 mmol/L (ref 22–32)
CREATININE: 5.9 mg/dL — AB (ref 0.61–1.24)
Chloride: 104 mmol/L (ref 101–111)
GFR calc non Af Amer: 10 mL/min — ABNORMAL LOW (ref >60)
GFR, EST AFRICAN AMERICAN: 11 mL/min — AB (ref >60)
GLUCOSE: 219 mg/dL — AB (ref 65–99)
Phosphorus: 5.7 mg/dL — ABNORMAL HIGH (ref 2.5–4.6)
Potassium: 4 mmol/L (ref 3.5–5.1)
SODIUM: 140 mmol/L (ref 135–145)

## 2015-02-16 LAB — ANCA TITERS
Atypical P-ANCA titer: <1:20 {titer}
C-ANCA: <1:20 {titer}
P-ANCA: <1:20 {titer}

## 2015-02-16 LAB — GLUCOSE, CAPILLARY
GLUCOSE-CAPILLARY: 110 mg/dL — AB (ref 65–99)
GLUCOSE-CAPILLARY: 132 mg/dL — AB (ref 65–99)
GLUCOSE-CAPILLARY: 168 mg/dL — AB (ref 65–99)
GLUCOSE-CAPILLARY: 209 mg/dL — AB (ref 65–99)
Glucose-Capillary: 220 mg/dL — ABNORMAL HIGH (ref 65–99)
Glucose-Capillary: 207 mg/dL — ABNORMAL HIGH (ref 65–99)
Glucose-Capillary: 155 mg/dL — ABNORMAL HIGH (ref 65–99)
Glucose-Capillary: 76 mg/dL (ref 65–99)

## 2015-02-16 LAB — GLOMERULAR BASEMENT MEMBRANE ANTIBODIES: GBM Ab: 4 units (ref 0–20)

## 2015-02-16 LAB — APTT: aPTT: 27 seconds (ref 24–37)

## 2015-02-16 LAB — MPO/PR-3 (ANCA) ANTIBODIES

## 2015-02-16 LAB — PROTIME-INR
INR: 1.19 (ref 0.00–1.49)
PROTHROMBIN TIME: 15.2 s (ref 11.6–15.2)

## 2015-02-16 LAB — CYCLIC CITRUL PEPTIDE ANTIBODY, IGG/IGA: CCP Antibodies IgG/IgA: 5 units (ref 0–19)

## 2015-02-16 LAB — RHEUMATOID FACTOR: Rhuematoid fact SerPl-aCnc: <10 IU/mL (ref 0.0–13.9)

## 2015-02-16 LAB — MAGNESIUM: MAGNESIUM: 2.1 mg/dL (ref 1.7–2.4)

## 2015-02-16 LAB — TRIGLYCERIDES: TRIGLYCERIDES: 109 mg/dL (ref <150)

## 2015-02-16 MED ORDER — INSULIN DETEMIR 100 UNIT/ML ~~LOC~~ SOLN
45.0000 [IU] | Freq: Every day | SUBCUTANEOUS | Status: DC
  Administered 2015-02-16: 45 [IU] via SUBCUTANEOUS
  Filled 2015-02-16 (×2): qty 0.45

## 2015-02-16 MED ORDER — INSULIN DETEMIR 100 UNIT/ML ~~LOC~~ SOLN
45.0000 [IU] | Freq: Every day | SUBCUTANEOUS | Status: DC
  Administered 2015-02-17: 45 [IU] via SUBCUTANEOUS
  Filled 2015-02-16 (×2): qty 0.45

## 2015-02-16 MED ORDER — INSULIN ASPART 100 UNIT/ML ~~LOC~~ SOLN
0.0000 [IU] | SUBCUTANEOUS | Status: DC
  Administered 2015-02-16: 5 [IU] via SUBCUTANEOUS
  Administered 2015-02-16 – 2015-02-17 (×4): 3 [IU] via SUBCUTANEOUS
  Administered 2015-02-17 (×2): 2 [IU] via SUBCUTANEOUS
  Administered 2015-02-17 – 2015-02-19 (×4): 3 [IU] via SUBCUTANEOUS
  Administered 2015-02-19: 5 [IU] via SUBCUTANEOUS

## 2015-02-16 NOTE — Progress Notes (Signed)
Discussed removal of contact precautions with infection prevention. MD advised ok to take off contact precautions. OK per infection prevention.

## 2015-02-16 NOTE — Progress Notes (Addendum)
PULMONARY / CRITICAL CARE MEDICINE   Name: Russell Price MRN: [id number] DOB: November 18, 1960    ADMISSION DATE:  02/14/2015 CONSULTATION DATE:  02/14/2015  REFERRING MD :  EDP Mackuen  CHIEF COMPLAINT:  SOB  INITIAL PRESENTATION: 54 year old [place] male with no cardiac history presented with SOB and hemoptysis. CXR showed R>L edema. He was intubated in ED for airway protection in setting of copious bloody secretions. PCCM to admit.   STUDIES:  CT chest 9/27 - reviewed by me. Patchy bilateral consolidation. No pathologic mediastinal LAD. Portable CXR 9/28 - ETT in good position & Right IJ in good position. Bilateral opacities R>L. Renal U/S 9/28 - No hydronephrosis or solid mass. TTE 9/28 - EF 45-50%. Mild diffuse hypokinesis. No regional wall motion abnormality. Grade 2 diastolic dysfunction.  SIGNIFICANT EVENTS: 9/27 - Admitted to hospital 9/27 - Intubated & central line placed. Attempted BAL. 9/28 - BAL by [facility] consulted  SUBJECTIVE: No acute events overnight. Patient successfully weaning on ventilator support.  REVIEW OF SYSTEMS:  Unable to obtain as patient is intubated.  VITAL SIGNS: Temp:  [97.5 F (36.4 C)-98.6 F (37 C)] 97.5 F (36.4 C) (09/29 0810) Pulse Rate:  [50-66] 56 (09/29 1155) Resp:  [5-24] 22 (09/29 1155) BP: (99-142)/(52-74) 119/65 mmHg (09/29 1155) SpO2:  [100 %] 100 % (09/29 1155) FiO2 (%):  [30 %-50 %] 30 % (09/29 1155) Weight:  [189 lb 6 oz (85.9 kg)] 189 lb 6 oz (85.9 kg) (09/29 0500) HEMODYNAMICS: CVP:  [7 mmHg-13 mmHg] 13 mmHg VENTILATOR SETTINGS: Vent Mode:  [-] PRVC FiO2 (%):  [30 %-50 %] 30 % Set Rate:  [22 bmp] 22 bmp Vt Set:  [410 mL] 410 mL PEEP:  [5 cmH20-12 cmH20] 5 cmH20 Plateau Pressure:  [15 cmH20-22 cmH20] 15 cmH20 INTAKE / OUTPUT:  Intake/Output Summary (Last 24 hours) at 02/16/15 1222 Last data filed at 02/16/15 0700  Gross per 24 hour  Intake 1894.8 ml  Output    520 ml  Net 1374.8 ml    PHYSICAL  EXAMINATION: General:  Sedated. No distress. Remains on ventilator. Integument:  Warm & dry. No rash on exposed skin. No bruising. HEENT:  No scleral injection or icterus. Endotracheal tube in place. Pupils equal and round. Cardiovascular:  Regular rate. No edema. No appreciable JVD.  Pulmonary:  Coarse breath sounds bilaterally unchanged from yesterday. Symmetric chest wall rise on ventilator.  Abdomen: Soft. Normal bowel sounds. Nondistended.  Musculoskeletal:  Normal bulk and tone. No joint deformity, synovial thickening, or effusion appreciated.   LABS:  CBC  Recent Labs Lab 02/14/15 2114 02/15/15 0450 02/16/15 0445  WBC 12.5* 16.4* 10.1  HGB 10.7* 10.6* 8.6*  HCT 31.6* 30.4* 24.9*  PLT 145* 142* 115*   Coag's  Recent Labs Lab 02/14/15 2114 02/16/15 0445  APTT  --  27  INR 1.23 1.19   BMET  Recent Labs Lab 02/15/15 0450 02/15/15 1215 02/16/15 0445  NA 138 140 140  K 5.3* 5.3* 4.0  CL 107 108 104  CO2 21* 25 24  BUN 53* 61* 77*  CREATININE 5.37* 5.47* 5.90*  GLUCOSE 134* 144* 219*   Electrolytes  Recent Labs Lab 02/14/15 2114  02/15/15 0450 02/15/15 1215 02/16/15 0445  CALCIUM 7.5*  < > 8.3* 8.1* 8.0*  MG 1.7  --  2.0  --  2.1  PHOS 3.3  --  1.5*  --  5.7*  < > = values in this interval not displayed. Sepsis Markers  Recent  Labs Lab 02/11/15 2343 02/14/15 1619 02/14/15 2118 02/14/15 2122  LATICACIDVEN 0.51 5.13*  --  3.3*  PROCALCITON  --   --  7.12  --    ABG  Recent Labs Lab 02/14/15 1837 02/15/15 0305 02/15/15 0455  PHART 7.260* 7.357 7.347*  PCO2ART 36.5 31.2* 36.2  PO2ART 86.0 227* 307*   Liver Enzymes  Recent Labs Lab 02/14/15 1604 02/16/15 0445  AST 36  --   ALT 19  --   ALKPHOS 92  --   BILITOT 1.0  --   ALBUMIN 3.1* 2.1*   Cardiac Enzymes  Recent Labs Lab 02/14/15 2300 02/15/15 0520 02/16/15 0445  TROPONINI 5.50* 11.78* 8.20*   Glucose  Recent Labs Lab 02/15/15 2353 02/16/15 0057 02/16/15 0203  02/16/15 0258 02/16/15 0336 02/16/15 0805  GLUCAP 132* 168* 209* 220* 207* 155*    Imaging No results found.   ASSESSMENT / PLAN:  PULMONARY OETT 9/27 >>> A: Acute hypoxic respiratory failure - DAH vs Pneumonia Diffuse alveolar hemorrhage  P:   Full vent support Reattempt bronchoscopy today ANCA, ANA, & Anti-CCP See ID  CARDIOVASCULAR A:  Sinus tachycardia - resolved Hypertensive emergency - resolved  Elevated troponin - improving. likely demand ischemia without focal wall motion abnormality on TTE  P:  Monitor on telemetry   RENAL A:   Acute on Chronic Renal Failure - UOP improving. Function worsening. Anion Gap metabolic Acidosis - resolved Lactic Acidosis - improving Hyperkalemia - improving  P:   Monitor UOP Repeat LA tomorrow AM Following Electrolytes daily D/C Bicarb gtt Nephrology following  GASTROINTESTINAL A:   No acute issues  P:   NPO for now Protonix IV daily  HEMATOLOGIC A:   Anemia - diffuse alveolar hemorrhage Leukocytosis - possibly infectious in etiology. Resolved.  Thrombocytopenia - likely consumption  P:  Follow Hgb w/ CBC daily Follow platelets daily w/ CBC Transfuse per ICU guidelines SCDs  INFECTIOUS A:   HCAP vs atypical pneumonia Leukocytosis  P:   BCx2 9/27 >>> UC 9/27 >>> U strep >>>neg U legionella >>>  BAL 9/28>>> AFB 9/28>>> Fungal cx 9/28>>> Cytology 9/28>>>negative  Sputum BAL 9/27>>> AFB stain and cultures>  Fungal Cx 9/27 >>> Cytology 9/27 > negative  Abx: Zosyn, start date 9/27 >>> Abx: Vancomycin, start date 9/27 >>>   ENDOCRINE A:   H/O DM-2  P:   Accu-Cheks every 4 hours  Low-dose sliding scale algorithm  NEUROLOGIC A:   Acute Encephalopathy Sedation needs on Ventilator  P:   RASS goal: 0 to -1 Propofol infusion PRN fentanyl   FAMILY  - Updates: Family updated by Dr. Nelda Marseille at beside 9/28. No family at bedside today.  - Inter-disciplinary family meet or Palliative  Care meeting due by:  10/4  TODAY'S SUMMARY:  Patient with probable pulmonary renal syndrome. Cultures are pending from specimens obtained. Bronchoalveolar lavage shows diffuse alveolar hemorrhage. Continuing on Solu-Medrol & broad-spectrum antibiotics at this time. Continuing to monitor renal function. Nephrology following.  I have spent a total of 33 minutes of critical care time caring for the patient & reviewing the patient's EMR.  Sonia Baller Ashok Cordia, M.D. [facility] Pulmonary & Critical Care Pager:  [phone] After 3pm or if no response, call [phone] 02/16/2015 12:22 PM

## 2015-02-16 NOTE — Progress Notes (Signed)
Patient CBG trending downward. Discussed with NP Marni Griffon. Orders received. Pt remains stable.

## 2015-02-16 NOTE — Progress Notes (Signed)
[facility] Progress Note    Assessment/ Plan:   1. AKI on CKD Stage IV: Likely secondary to ischemic ATN from sepsis. Cr still rising from 5.37>5.47>5.90. UOP is adequate with 565 ml over past 24 hours. ANA still pending. RF normal at < 10.0. Doubt vasculitis/inflammatory process as cause for AKI. No emergent dialysis needs at this time. Potassium is stable. Will continue to monitor. 2. Hemoptysis/Acute Respiratory Failure: Patient presented with 1-2 days of hemoptysis and dyspnea requiring intubation and ventilation. CT Chest concerning for diffuse alveolar hemorrhage and possibly a PNA. Bronchoscopy performed 9/28. Both sets of BAL, fungal, and AFB cultures negative so far (9/27 and 9/28). Blood cultures negative. Resp culture 9/27 with rare GPC in pairs. Currently on Vanc and Zosyn. Management per primary team.  3. Hyperkalemia: K stable at 4.0 this morning from 5.3 yesterday afternoon. Continue to monitor.  4. Hypophosphatemia: Ph 5.7 from 1.5 yesterday. He received sodium phosphate 15 mmol x 1 yesterday. Continue to monitor. Surprised by jump in phosphate.  5. Metabolic Acidosis: Resolved. Bicarb 24 this AM. Currently on sodium bicarb drip at 50 ml/hr. Can consider discontinuing.  6. Normocytic Anemia: Hgb 8.6 from 10.6. Likely anemia of chronic disease given his CKD plus blood loss from hemoptysis.  Continue to monitor. Transfuse for Hgb < 7.0.  7. HTN: BPs 99991111 systolic. Not on pressors. Patient is on Amlodipine 10 mg daily at home. Currently has Hydralazine PRN for SBP > 180.  8. Type 2 DM: Last HbA1c 8.7 on 9/16. CBGs controlled in hospital in 150s-200s mostly. He is on Glipizide 10 mg daily and Lantus 50 units QHS at home. He was transitioned off insulin gtt last night and now on Levemir 45 units QHS with moderate ISS. Management per primary team.   Subjective:   No acute events overnight. Cr still rising, but making decent UOP.    Objective:   BP 125/65 mmHg  Pulse  61  Temp(Src) 98.1 F (36.7 C) (Oral)  Resp 22  Ht 5\' 10"  (1.778 m)  Wt 189 lb 6 oz (85.9 kg)  BMI 27.17 kg/m2  SpO2 100%  Intake/Output Summary (Last 24 hours) at 02/16/15 0802 Last data filed at 02/16/15 0600  Gross per 24 hour  Intake 2078.8 ml  Output    565 ml  Net 1513.8 ml   Weight change: 4 lb 10.1 oz (2.1 kg)  Physical Exam: Gen: sedated on vent CVS: bradycardic, no m/g/r Resp: coarse breath sounds bilaterally Abd: BS+, soft, non-distended Ext: warm, 1+ peripheral edema   Imaging: Ct Chest Wo Contrast  02/14/2015   CLINICAL DATA:  Hemoptysis for 1 day with profound dyspnea and tachycardia. History of hypertension and diabetes. Adult respiratory distress syndrome. Initial encounter.  EXAM: CT CHEST WITHOUT CONTRAST  TECHNIQUE: Multidetector CT imaging of the chest was performed following the standard protocol without IV contrast.  COMPARISON:  Radiographs 02/14/2015 and 01/03/2014.  FINDINGS: Mediastinum/Nodes: There are mildly prominent mediastinal lymph nodes, including a 9 mm right paratracheal node on image number 20 and a 10 mm precarinal node on image 23. Hilar evaluation limited by the lack of intravenous contrast and adjacent airspace opacities. No axillary adenopathy.The patient is intubated. The endotracheal tube terminates in the distal trachea above the carina. The thyroid gland, trachea and esophagus demonstrate no significant findings. The heart appears mildly enlarged. There is no pericardial effusion. Mild coronary artery calcifications are noted.  Lungs/Pleura: As demonstrated radiographically, there are extensive bilateral perihilar airspace opacities with air bronchograms. There  is consolidation dependently in both lower lobes. No endobronchial lesion, parenchymal necrosis or significant pleural effusion identified.  Upper abdomen: The stomach appears mildly distended. Otherwise unremarkable.  Musculoskeletal/Chest wall: There is no chest wall mass or suspicious  osseous finding. There is a small amount of gas inferior to the left clavicle from presumed attempted central line placement. No pneumothorax or pneumomediastinum. Mild bilateral gynecomastia noted.  IMPRESSION: 1. Bilateral perihilar infiltrates with air bronchograms and dependent consolidation. Findings may be secondary to pneumonia, ARDS, hemorrhage or a combination thereof. 2. Mildly prominent mediastinal lymph nodes, likely reactive. 3. No significant pleural effusion. 4. Coronary artery calcifications.   Electronically Signed   By: Richardean Sale M.D.   On: 02/14/2015 17:52   US Renal  02/15/2015   CLINICAL DATA:  Acute kidney injury  EXAM: RENAL / URINARY TRACT ULTRASOUND COMPLETE  COMPARISON:  None.  FINDINGS: Right Kidney:  Length: 12 cm. Borderline echogenic cortex. Mild and nonspecific perinephric edema. No mass or hydronephrosis visualized.  Left Kidney:  Length: 12 cm. Borderline echogenic cortex. 11 mm upper pole cyst with possible thin septation. No solid mass or hydronephrosis visualized.  Bladder:  Not visualized with report of Foley catheter.  Possibility of gallbladder sludge, without visualized wall thickening.  IMPRESSION: No hydronephrosis.   Electronically Signed   By: Monte Fantasia M.D.   On: 02/15/2015 04:12   Dg Chest Port 1 View  02/15/2015   CLINICAL DATA:  Evaluate airway  EXAM: PORTABLE CHEST 1 VIEW  COMPARISON:  02/14/2015  FINDINGS: Support devices are stable. There is cardiomegaly. Bilateral airspace opacities are noted, right greater than left. No visible effusions. No acute bony abnormality.  IMPRESSION: Continued bilateral airspace disease, right greater than left, not significantly changed. This could represent asymmetric edema or infection.   Electronically Signed   By: Rolm Baptise M.D.   On: 02/15/2015 07:19   Dg Chest Port 1 View  02/14/2015   CLINICAL DATA:  Status post central line placement  EXAM: PORTABLE CHEST 1 VIEW  COMPARISON:  February 14, 2015 10:59  a.m.  FINDINGS: Right jugular central venous line is identified in the superior vena cava. Endotracheal tube is identified with distal tip 5.6 cm from carina. Nasogastric tube is identified distal tip not included but is at least in the stomach. There are airspace opacities throughout both lungs, right greater than left unchanged compared to prior exam. No definite pleural line is identified to suggest pneumothorax. The bony structures stable.  IMPRESSION: Right jugular central venous line distal tip in the superior vena cava. Endotracheal tube in good position. There is no pneumothorax.   Electronically Signed   By: Abelardo Diesel M.D.   On: 02/14/2015 19:48   Dg Chest Portable 1 View  02/14/2015   CLINICAL DATA:  Labored breathing.  EXAM: PORTABLE CHEST 1 VIEW  COMPARISON:  None.  FINDINGS: The patient has extensive bilateral airspace disease, worse on the right. No pleural effusion or pneumothorax. Heart size is upper normal.  IMPRESSION: Extensive bilateral airspace disease could be due to asymmetric edema, pneumonia or possibly pulmonary hemorrhage.   Electronically Signed   By: Inge Rise M.D.   On: 02/14/2015 16:25   Dg Abd Portable 1v  02/14/2015   CLINICAL DATA:  Encounter for nasogastric tube placement.  EXAM: PORTABLE ABDOMEN - 1 VIEW  COMPARISON:  Chest radiograph at 1938 hr  FINDINGS: Tip and side port of the enteric tube below the diaphragm in the stomach. No dilated bowel loops to  suggest obstruction. Dense consolidation in the right lung with paucity of lung markings peripherally, suboptimally assessed on current exam.  IMPRESSION: Tip and side port of the enteric tube below the diaphragm in the stomach.   Electronically Signed   By: Jeb Levering M.D.   On: 02/14/2015 22:28    Labs: BMET  Recent Labs Lab 02/11/15 2333 02/14/15 1604 02/14/15 1617 02/14/15 1618 02/14/15 2114 02/15/15 0025 02/15/15 0450 02/15/15 1215 02/16/15 0445  NA 139 137 137  --  132* 138 138 140 140   K 3.6 5.6* 5.4*  --  >7.5* 5.6* 5.3* 5.3* 4.0  CL 110 106 109  --  104 109 107 108 104  CO2 18* 15*  --   --  19* 19* 21* 25 24  GLUCOSE 36* 363* 376*  --  440* 344* 134* 144* 219*  BUN 32* 43* 45*  --  47* 50* 53* 61* 77*  CREATININE 3.12* 4.28* 4.30* 4.50* 4.75* 5.11* 5.37* 5.47* 5.90*  CALCIUM 8.7* 8.6*  --   --  7.5* 8.1* 8.3* 8.1* 8.0*  PHOS  --   --   --   --  3.3  --  1.5*  --  5.7*   CBC  Recent Labs Lab 02/14/15 1604 02/14/15 1617 02/14/15 2114 02/15/15 0450 02/16/15 0445  WBC 20.9*  --  12.5* 16.4* 10.1  NEUTROABS 19.5*  --   --   --  9.5*  HGB 11.5* 12.2* 10.7* 10.6* 8.6*  HCT 33.7* 36.0* 31.6* 30.4* 24.9*  MCV 83.4  --  84.0 84.0 83.6  PLT 208  --  145* 142* 115*    Medications:    . antiseptic oral rinse  7 mL Mouth Rinse QID  . aspirin  325 mg Oral Daily  . chlorhexidine gluconate  15 mL Mouth Rinse BID  . insulin aspart  0-15 Units Subcutaneous 6 times per day  . insulin detemir  45 Units Subcutaneous QHS  . methylPREDNISolone (SOLU-MEDROL) injection  125 mg Intravenous Q6H  . pantoprazole (PROTONIX) IV  40 mg Intravenous QHS  . piperacillin-tazobactam (ZOSYN)  IV  2.25 g Intravenous 4 times per day  . piperacillin-tazobactam  3.375 g Intravenous Once  . sodium chloride  3 mL Intravenous Q12H  . vancomycin  1,000 mg Intravenous Q48H      Albin Felling, MD, MPH Internal Medicine Resident, PGY-II Pager: [phone]   02/16/2015, 8:02 AM

## 2015-02-16 NOTE — Progress Notes (Signed)
Family states "He's not comfortable."  I explained that we must reposition the patient every 2 hours to prevent pressure ulcers.  Family verbalized understanding and then again said "he's uncomfortable."

## 2015-02-16 NOTE — Progress Notes (Signed)
eLink Physician-Brief Progress Note Patient Name: Russell Price DOB: 1960-12-16 MRN: [id number]   Date of Service  02/16/2015  HPI/Events of Note  TRANSITION OFF INSULIN DRIP.   eICU Interventions  STARTED LEVEMIR, Q4 SSI.      Intervention Category Intermediate Interventions: Hyperglycemia - evaluation and treatment  Pradeep Ramachandran 02/16/2015, 12:08 AM

## 2015-02-17 ENCOUNTER — Inpatient Hospital Stay (HOSPITAL_COMMUNITY): Payer: BLUE CROSS/BLUE SHIELD

## 2015-02-17 DIAGNOSIS — I63311 Cerebral infarction due to thrombosis of right middle cerebral artery: Secondary | ICD-10-CM

## 2015-02-17 LAB — CBC WITH DIFFERENTIAL/PLATELET
Basophils Absolute: 0 10*3/uL (ref 0.0–0.1)
Basophils Relative: 0 %
EOS ABS: 0 10*3/uL (ref 0.0–0.7)
EOS PCT: 0 %
HCT: 25.9 % — ABNORMAL LOW (ref 39.0–52.0)
Hemoglobin: 9.1 g/dL — ABNORMAL LOW (ref 13.0–17.0)
LYMPHS PCT: 3 %
Lymphs Abs: 0.3 10*3/uL — ABNORMAL LOW (ref 0.7–4.0)
MCH: 29.4 pg (ref 26.0–34.0)
MCHC: 35.1 g/dL (ref 30.0–36.0)
MCV: 83.5 fL (ref 78.0–100.0)
MONO ABS: 0.3 10*3/uL (ref 0.1–1.0)
Monocytes Relative: 3 %
Neutro Abs: 9.6 10*3/uL — ABNORMAL HIGH (ref 1.7–7.7)
Neutrophils Relative %: 95 %
PLATELETS: 118 10*3/uL — AB (ref 150–400)
RBC: 3.1 MIL/uL — ABNORMAL LOW (ref 4.22–5.81)
RDW: 13.4 % (ref 11.5–15.5)
WBC: 10.1 10*3/uL (ref 4.0–10.5)

## 2015-02-17 LAB — CULTURE, BAL-QUANTITATIVE: COLONY COUNT: NO GROWTH

## 2015-02-17 LAB — CULTURE, RESPIRATORY

## 2015-02-17 LAB — CULTURE, RESPIRATORY W GRAM STAIN

## 2015-02-17 LAB — TRIGLYCERIDES: TRIGLYCERIDES: 199 mg/dL — AB (ref <150)

## 2015-02-17 LAB — RENAL FUNCTION PANEL
Albumin: 2.3 g/dL — ABNORMAL LOW (ref 3.5–5.0)
Anion gap: 13 (ref 5–15)
BUN: 84 mg/dL — AB (ref 6–20)
CHLORIDE: 106 mmol/L (ref 101–111)
CO2: 26 mmol/L (ref 22–32)
Calcium: 7.7 mg/dL — ABNORMAL LOW (ref 8.9–10.3)
Creatinine, Ser: 5.76 mg/dL — ABNORMAL HIGH (ref 0.61–1.24)
GFR, EST AFRICAN AMERICAN: 11 mL/min — AB (ref >60)
GFR, EST NON AFRICAN AMERICAN: 10 mL/min — AB (ref >60)
Glucose, Bld: 144 mg/dL — ABNORMAL HIGH (ref 65–99)
POTASSIUM: 3.7 mmol/L (ref 3.5–5.1)
Phosphorus: 7 mg/dL — ABNORMAL HIGH (ref 2.5–4.6)
Sodium: 145 mmol/L (ref 135–145)

## 2015-02-17 LAB — GLUCOSE, CAPILLARY
GLUCOSE-CAPILLARY: 130 mg/dL — AB (ref 65–99)
GLUCOSE-CAPILLARY: 133 mg/dL — AB (ref 65–99)
GLUCOSE-CAPILLARY: 163 mg/dL — AB (ref 65–99)
GLUCOSE-CAPILLARY: 87 mg/dL (ref 65–99)
Glucose-Capillary: 160 mg/dL — ABNORMAL HIGH (ref 65–99)
Glucose-Capillary: 169 mg/dL — ABNORMAL HIGH (ref 65–99)
Glucose-Capillary: 178 mg/dL — ABNORMAL HIGH (ref 65–99)

## 2015-02-17 LAB — CULTURE, BAL-QUANTITATIVE W GRAM STAIN
Colony Count: 40000
Culture: NO GROWTH

## 2015-02-17 LAB — PROTIME-INR
INR: 1.15 (ref 0.00–1.49)
Prothrombin Time: 14.9 seconds (ref 11.6–15.2)

## 2015-02-17 LAB — ANTINUCLEAR ANTIBODIES, IFA: ANTINUCLEAR ANTIBODIES, IFA: NEGATIVE

## 2015-02-17 LAB — MAGNESIUM: MAGNESIUM: 2.3 mg/dL (ref 1.7–2.4)

## 2015-02-17 LAB — APTT: aPTT: 26 seconds (ref 24–37)

## 2015-02-17 MED ORDER — STROKE: EARLY STAGES OF RECOVERY BOOK
Freq: Once | Status: AC
  Administered 2015-02-17: 21:00:00
  Filled 2015-02-17: qty 1

## 2015-02-17 NOTE — Progress Notes (Signed)
PULMONARY / CRITICAL CARE MEDICINE   Name: Russell Price MRN: [id number] DOB: [birth year]    ADMISSION DATE:  02/14/2015 CONSULTATION DATE:  02/14/2015  REFERRING MD :  EDP Mackuen  CHIEF COMPLAINT:  SOB  INITIAL PRESENTATION: 54 year old [place] male with no cardiac history presented with SOB and hemoptysis. CXR showed R>L edema. He was intubated in ED for airway protection in setting of copious bloody secretions. PCCM to admit.   STUDIES:  CT chest 9/27 - reviewed by me. Patchy bilateral consolidation. No pathologic mediastinal LAD. Portable CXR 9/28 - ETT in good position & Right IJ in good position. Bilateral opacities R>L. Renal U/S 9/28 - No hydronephrosis or solid mass. TTE 9/28 - EF 45-50%. Mild diffuse hypokinesis. No regional wall motion abnormality. Grade 2 diastolic dysfunction.  SIGNIFICANT EVENTS: 9/27 - Admitted to hospital 9/27 - Intubated & central line placed. Attempted BAL. 9/28 - BAL by [facility] consulted  SUBJECTIVE: No acute events overnight. Patient continuing to wean on ventilator support.  REVIEW OF SYSTEMS:  Unable to obtain as patient is intubated.  VITAL SIGNS: Temp:  [97.7 F (36.5 C)-98.3 F (36.8 C)] 98.2 F (36.8 C) (09/30 1154) Pulse Rate:  [48-70] 60 (09/30 1133) Resp:  [15-26] 26 (09/30 1133) BP: (116-147)/(49-78) 128/60 mmHg (09/30 1133) SpO2:  [100 %] 100 % (09/30 1100) FiO2 (%):  [30 %] 30 % (09/30 1133) Weight:  [188 lb 15 oz (85.7 kg)] 188 lb 15 oz (85.7 kg) (09/30 0500) HEMODYNAMICS: CVP:  [14 mmHg] 14 mmHg VENTILATOR SETTINGS: Vent Mode:  [-] PRVC FiO2 (%):  [30 %] 30 % Set Rate:  [22 bmp] 22 bmp Vt Set:  [410 mL] 410 mL PEEP:  [5 cmH20] 5 cmH20 Plateau Pressure:  [16 cmH20-19 cmH20] 16 cmH20 INTAKE / OUTPUT:  Intake/Output Summary (Last 24 hours) at 02/17/15 1346 Last data filed at 02/17/15 1200  Gross per 24 hour  Intake 1185.23 ml  Output   1540 ml  Net -354.77 ml    PHYSICAL EXAMINATION: General:   Comfortable. No distress. Sedated. Integument:  Warm & dry. No rash on exposed skin.  HEENT:  No scleral injection or icterus. Endotracheal tube in place.  Cardiovascular:  Slightly bradycardic. No edema. No appreciable JVD.  Pulmonary:  Coarse breath sounds on ventilator. Symmetric chest wall rise on ventilator.  Abdomen: Soft. Normal bowel sounds. Nondistended.  Neurological: Sedated. Spontaneously moves all 4 extremities. Pupils equal and round.    LABS:  CBC  Recent Labs Lab 02/15/15 0450 02/16/15 0445 02/17/15 0445  WBC 16.4* 10.1 10.1  HGB 10.6* 8.6* 9.1*  HCT 30.4* 24.9* 25.9*  PLT 142* 115* 118*   Coag's  Recent Labs Lab 02/14/15 2114 02/16/15 0445 02/17/15 0445  APTT  --  27 26  INR 1.23 1.19 1.15   BMET  Recent Labs Lab 02/15/15 1215 02/16/15 0445 02/17/15 0445  NA 140 140 145  K 5.3* 4.0 3.7  CL 108 104 106  CO2 25 24 26   BUN 61* 77* 84*  CREATININE 5.47* 5.90* 5.76*  GLUCOSE 144* 219* 144*   Electrolytes  Recent Labs Lab 02/15/15 0450 02/15/15 1215 02/16/15 0445 02/17/15 0445  CALCIUM 8.3* 8.1* 8.0* 7.7*  MG 2.0  --  2.1 2.3  PHOS 1.5*  --  5.7* 7.0*   Sepsis Markers  Recent Labs Lab 02/11/15 2343 02/14/15 1619 02/14/15 2118 02/14/15 2122  LATICACIDVEN 0.51 5.13*  --  3.3*  PROCALCITON  --   --  7.12  --  ABG  Recent Labs Lab 02/14/15 1837 02/15/15 0305 02/15/15 0455  PHART 7.260* 7.357 7.347*  PCO2ART 36.5 31.2* 36.2  PO2ART 86.0 227* 307*   Liver Enzymes  Recent Labs Lab 02/14/15 1604 02/16/15 0445 02/17/15 0445  AST 36  --   --   ALT 19  --   --   ALKPHOS 92  --   --   BILITOT 1.0  --   --   ALBUMIN 3.1* 2.1* 2.3*   Cardiac Enzymes  Recent Labs Lab 02/14/15 2300 02/15/15 0520 02/16/15 0445  TROPONINI 5.50* 11.78* 8.20*   Glucose  Recent Labs Lab 02/16/15 1547 02/16/15 1940 02/16/15 2349 02/17/15 0355 02/17/15 0817 02/17/15 1144  GLUCAP 76 87 133* 130* 160* 163*    Imaging No results  found.   ASSESSMENT / PLAN:  PULMONARY OETT 9/27 >>> A: Acute hypoxic respiratory failure - DAH vs Pneumonia Diffuse alveolar hemorrhage - (ANA, RF, CCP, GBM, ANCA, MPO, & PR3 negative)  P:   Full vent support SBT AM D/C Solumedrol See ID  CARDIOVASCULAR A:  Sinus tachycardia - resolved Hypertensive emergency - resolved  Elevated troponin - improving. likely demand ischemia without focal wall motion abnormality on TTE  P:  Monitor on telemetry   RENAL A:   Acute on Chronic Renal Failure - UOP improving. Function improving. Anion Gap metabolic Acidosis - resolved Lactic Acidosis - improving Hyperkalemia - resolved  P:   Monitor UOP Following Electrolytes daily D/C Bicarb gtt Nephrology following  GASTROINTESTINAL A:   No acute issues  P:   NPO for now Protonix IV daily  HEMATOLOGIC A:   Anemia - diffuse alveolar hemorrhage. Improving. Leukocytosis - possibly infectious in etiology. Resolved.  Thrombocytopenia - likely consumption. Improving.  P:  Follow Hgb w/ CBC daily Follow platelets daily w/ CBC Transfuse per ICU guidelines SCDs  INFECTIOUS A:   HCAP vs atypical pneumonia Leukocytosis  P:   Checking respiratory viral panel PCR today  BCx2 9/27 >>>  U strep >>>neg U legionella >>>neg  BAL 9/28>>>neg AFB 9/28>>> smear negative Fungal cx 9/28>>> Cytology 9/28>>>negative  Sputum BAL 9/27>>>Oral flora AFB stain and cultures> smear negative Fungal Cx 9/27 >>> Cytology 9/27 > negative  Abx: Zosyn, start date 9/27 >>> Abx: Vancomycin, start date 9/27 >>>   ENDOCRINE A:   H/O DM-2  P:   Accu-Cheks every 4 hours  Low-dose sliding scale algorithm  NEUROLOGIC A:   Acute Encephalopathy Sedation needs on Ventilator  P:   RASS goal: 0 to -1 Propofol infusion PRN fentanyl   FAMILY  - Updates: Family updated by me at bedside today.  - Inter-disciplinary family meet or Palliative Care meeting due by:  10/4  TODAY'S  SUMMARY:  Patient with probable pulmonary renal syndrome. Cultures are pending from specimens obtained. Bronchoalveolar lavage shows diffuse alveolar hemorrhage. His serologies have been negative. Discontinuing Solu-Medrol at this time. Continuing broad-spectrum antibiotic coverage. Plan for spontaneous breathing trial in the morning.  I have spent a total of 37 minutes of critical care time caring for the patient, updating the patient's family at bedside, & reviewing the patient's EMR.  Sonia Baller Ashok Cordia, M.D. [facility] Pulmonary & Critical Care Pager:  [phone] After 3pm or if no response, call [phone] 02/17/2015 1:46 PM

## 2015-02-17 NOTE — Consult Note (Signed)
Stroke Consult    Chief Complaint: CVA HPI: Russell Price is an 54 y.o. male hx of HTN, DM who presented to ED on 9/27 with hemoptysis, shortly after developed dyspnea and tachycardia. Intubated in the ED for airway protection. Unclear etiology of presentation. Had auto-immune panel sent which was unremarkable. Started on high dose steroids and broad spectrum antibiotics. Neurology consulted after CT head imaging showed a likely infarct in the anterior right thalamus (imaging reviewed). Hospital course complicated by AKI on CKD for which nephrology is following.    Date last known well: unclear Time last known well: unclear tPA Given: no, unclear LSW Modified Rankin: 1  Past Medical History  Diagnosis Date  . Diabetes mellitus without complication     followed by Dr Chalmers Cater  . Hypertension     followed by [facility]    History reviewed. No pertinent past surgical history.  Family History  Problem Relation Age of Onset  . Diabetes Mother    Social History:  reports that he quit smoking about 32 years ago. He does not have any smokeless tobacco history on file. He reports that he does not drink alcohol or use illicit drugs.  Allergies: No Known Allergies  Medications Prior to Admission  Medication Sig Dispense Refill  . amLODipine (NORVASC) 10 MG tablet Take 1 tablet (10 mg total) by mouth daily. Stop lisinopril . 30 tablet 5  . glipiZIDE (GLUCOTROL) 5 MG tablet Take 2 pills in the morning and one in the evening for diabetes 90 tablet 2  . Insulin Glargine (LANTUS SOLOSTAR) 100 UNIT/ML Solostar Pen Inject 50 units subcutaneously at bedtime. 15 mL 0    ROS: Out of a complete 14 system review, the patient complains of only the following symptoms, and all other reviewed systems are negative. Unable to obtain   Physical Examination: Filed Vitals:   02/17/15 1935  BP:   Pulse:   Temp: 98 F (36.7 C)  Resp:    Physical Exam  Constitutional: He appears  well-developed and well-nourished.  Psych: Affect appropriate to situation Eyes: No scleral injection HENT: No OP obstrucion Head: Normocephalic.  Cardiovascular: Normal rate and regular rhythm.  Respiratory: coarse bilateral breath sounds GI: Soft. Bowel sounds are normal. No distension. There is no tenderness.  Skin: WDI  Neurologic Examination: Mental Status: Intubated, on propofol. No spontaneous eye opening, not following commands.  Cranial Nerves: II: unable to visualize optic discs, Left pupil reactive to light, right pupil large, irregular not reactive (appears chronic) III,IV, VI: ptosis not present, eyes midline, no gaze deviation V,VII: face symmetric, facial light touch sensation normal bilaterally VIII: hearing normal bilaterally IX,X: gag reflex present XI: unable to test XII: unable to test Motor: Due to mental status, unable to formally test Moves right side spontaneously and antigravity No spontaneous movement noted on left side Tone and bulk:normal tone throughout; no atrophy noted Sensory: weak withdrawal to noxious stimuli on the right side, no withdrawal on the left side Deep Tendon Reflexes: 2+ and symmetric throughout Plantars: Right: downgoing   Left: downgoing Cerebellar: Unable to test Gait: unable to test  Laboratory Studies:   Basic Metabolic Panel:  Recent Labs Lab 02/14/15 2114 02/15/15 0025 02/15/15 0450 02/15/15 1215 02/16/15 0445 02/17/15 0445  NA 132* 138 138 140 140 145  K >7.5* 5.6* 5.3* 5.3* 4.0 3.7  CL 104 109 107 108 104 106  CO2 19* 19* 21* 25 24 26   GLUCOSE 440* 344* 134* 144* 219* 144*  BUN  47* 50* 53* 61* 77* 84*  CREATININE 4.75* 5.11* 5.37* 5.47* 5.90* 5.76*  CALCIUM 7.5* 8.1* 8.3* 8.1* 8.0* 7.7*  MG 1.7  --  2.0  --  2.1 2.3  PHOS 3.3  --  1.5*  --  5.7* 7.0*    Liver Function Tests:  Recent Labs Lab 02/14/15 1604 02/16/15 0445 02/17/15 0445  AST 36  --   --   ALT 19  --   --   ALKPHOS 92  --   --    BILITOT 1.0  --   --   PROT 6.6  --   --   ALBUMIN 3.1* 2.1* 2.3*   No results for input(s): LIPASE, AMYLASE in the last 168 hours. No results for input(s): AMMONIA in the last 168 hours.  CBC:  Recent Labs Lab 02/14/15 1604 02/14/15 1617 02/14/15 2114 02/15/15 0450 02/16/15 0445 02/17/15 0445  WBC 20.9*  --  12.5* 16.4* 10.1 10.1  NEUTROABS 19.5*  --   --   --  9.5* 9.6*  HGB 11.5* 12.2* 10.7* 10.6* 8.6* 9.1*  HCT 33.7* 36.0* 31.6* 30.4* 24.9* 25.9*  MCV 83.4  --  84.0 84.0 83.6 83.5  PLT 208  --  145* 142* 115* 118*    Cardiac Enzymes:  Recent Labs Lab 02/14/15 2118 02/14/15 2300 02/15/15 0520 02/16/15 0445  TROPONINI 2.99* 5.50* 11.78* 8.20*    BNP: Invalid input(s): POCBNP  CBG:  Recent Labs Lab 02/16/15 2349 02/17/15 0355 02/17/15 0817 02/17/15 1144 02/17/15 1533  GLUCAP 133* 130* 160* 163* 169*    Microbiology: Results for orders placed or performed during the hospital encounter of 02/14/15  Culture, blood (routine x 2)     Status: None (Preliminary result)   Collection Time: 02/14/15  4:04 PM  Result Value Ref Range Status   Specimen Description BLOOD LEFT ARM  Final   Special Requests BOTTLES DRAWN AEROBIC AND ANAEROBIC 5CC  Final   Culture NO GROWTH 3 DAYS  Final   Report Status PENDING  Incomplete  Culture, bal-quantitative     Status: None   Collection Time: 02/14/15  5:19 PM  Result Value Ref Range Status   Specimen Description BRONCHIAL ALVEOLAR LAVAGE  Final   Special Requests NONE  Final   Gram Stain   Final    RARE WBC PRESENT, PREDOMINANTLY PMN NO SQUAMOUS EPITHELIAL CELLS SEEN NO ORGANISMS SEEN Performed at [place]   Final    40,000 COLONIES/ML Performed at [facility]    Culture   Final    Non-Pathogenic Oropharyngeal-type Flora Isolated. Performed at [facility]    Report Status 02/17/2015 FINAL  Final  Fungus Culture with Smear     Status: None (Preliminary result)    Collection Time: 02/14/15  5:19 PM  Result Value Ref Range Status   Specimen Description BRONCHIAL ALVEOLAR LAVAGE  Final   Special Requests NONE  Final   Fungal Smear   Final    NO YEAST OR FUNGAL ELEMENTS SEEN Performed at [facility]    Culture   Final    CULTURE IN PROGRESS FOR FOUR WEEKS Performed at [facility]    Report Status PENDING  Incomplete  Gram stain     Status: None   Collection Time: 02/14/15  5:19 PM  Result Value Ref Range Status   Specimen Description BRONCHIAL ALVEOLAR LAVAGE  Final   Special Requests NONE  Final   Gram Stain  Final    RARE WBC PRESENT,BOTH PMN AND MONONUCLEAR NO ORGANISMS SEEN    Report Status 02/14/2015 FINAL  Final  AFB culture with smear     Status: None (Preliminary result)   Collection Time: 02/14/15  5:19 PM  Result Value Ref Range Status   Specimen Description BRONCHIAL ALVEOLAR LAVAGE  Final   Special Requests NONE  Final   Acid Fast Smear   Final    NO ACID FAST BACILLI SEEN Performed at [facility]    Culture   Final    CULTURE WILL BE EXAMINED FOR 6 WEEKS BEFORE ISSUING A FINAL REPORT Performed at [facility]    Report Status PENDING  Incomplete  MRSA PCR Screening     Status: None   Collection Time: 02/14/15  8:05 PM  Result Value Ref Range Status   MRSA by PCR NEGATIVE NEGATIVE Final    Comment:        The GeneXpert MRSA Assay (FDA approved for NASAL specimens only), is one component of a comprehensive MRSA colonization surveillance program. It is not intended to diagnose MRSA infection nor to guide or monitor treatment for MRSA infections.   Culture, blood (routine x 2)     Status: None (Preliminary result)   Collection Time: 02/14/15  9:10 PM  Result Value Ref Range Status   Specimen Description BLOOD RIGHT ANTECUBITAL  Final   Special Requests IN PEDIATRIC BOTTLE 3CC  Final   Culture NO GROWTH 3 DAYS  Final   Report Status PENDING  Incomplete  AFB culture with smear      Status: None (Preliminary result)   Collection Time: 02/14/15  9:29 PM  Result Value Ref Range Status   Specimen Description BRONCHIAL ALVEOLAR LAVAGE  Final   Special Requests NONE  Final   Acid Fast Smear   Final    NO ACID FAST BACILLI SEEN Performed at [facility]    Culture   Final    CULTURE WILL BE EXAMINED FOR 6 WEEKS BEFORE ISSUING A FINAL REPORT Performed at [facility]    Report Status PENDING  Incomplete  Fungus Culture with Smear     Status: None (Preliminary result)   Collection Time: 02/14/15  9:30 PM  Result Value Ref Range Status   Specimen Description BRONCHIAL ALVEOLAR LAVAGE  Final   Special Requests NONE  Final   Fungal Smear   Final    NO YEAST OR FUNGAL ELEMENTS SEEN Performed at [facility]    Culture   Final    CULTURE IN PROGRESS FOR FOUR WEEKS Performed at [facility]    Report Status PENDING  Incomplete  Gram stain     Status: None   Collection Time: 02/14/15  9:31 PM  Result Value Ref Range Status   Specimen Description BRONCHIAL ALVEOLAR LAVAGE  Final   Special Requests NONE  Final   Gram Stain   Final    RARE WBC PRESENT, PREDOMINANTLY PMN NO ORGANISMS SEEN    Report Status 02/14/2015 FINAL  Final  Culture, respiratory (NON-Expectorated)     Status: None   Collection Time: 02/14/15  9:31 PM  Result Value Ref Range Status   Specimen Description BRONCHIAL ALVEOLAR LAVAGE  Final   Special Requests ADDED ON 02/15/15  Final   Gram Stain   Final    FEW WBC PRESENT, PREDOMINANTLY PMN RARE SQUAMOUS EPITHELIAL CELLS PRESENT RARE GRAM POSITIVE COCCI IN PAIRS Performed at [facility]  Culture   Final    Non-Pathogenic Oropharyngeal-type Flora Isolated. Performed at [facility]    Report Status 02/17/2015 FINAL  Final  AFB culture with smear     Status: None (Preliminary result)   Collection Time: 02/15/15 10:30 AM  Result Value Ref Range Status   Specimen Description BRONCHIAL  ALVEOLAR LAVAGE  Final   Special Requests NONE  Final   Acid Fast Smear   Final    NO ACID FAST BACILLI SEEN Performed at [facility]    Culture   Final    CULTURE WILL BE EXAMINED FOR 6 WEEKS BEFORE ISSUING A FINAL REPORT Performed at [facility]    Report Status PENDING  Incomplete  Fungus Culture with Smear     Status: None (Preliminary result)   Collection Time: 02/15/15 10:30 AM  Result Value Ref Range Status   Specimen Description BRONCHIAL ALVEOLAR LAVAGE  Final   Special Requests NONE  Final   Fungal Smear   Final    NO YEAST OR FUNGAL ELEMENTS SEEN Performed at [facility]    Culture   Final    CULTURE IN PROGRESS FOR FOUR WEEKS Performed at [facility]    Report Status PENDING  Incomplete  Culture, bal-quantitative     Status: None   Collection Time: 02/15/15 10:55 AM  Result Value Ref Range Status   Specimen Description BRONCHIAL ALVEOLAR LAVAGE  Final   Special Requests NONE  Final   Gram Stain   Final    MODERATE WBC PRESENT, PREDOMINANTLY PMN NO SQUAMOUS EPITHELIAL CELLS SEEN NO ORGANISMS SEEN Performed at [place] Performed at [facility]   Final   Culture   Final    NO GROWTH 2 DAYS Performed at [facility]    Report Status 02/17/2015 FINAL  Final    Coagulation Studies:  Recent Labs  02/14/15 2114 02/16/15 0445 02/17/15 0445  LABPROT 15.7* 15.2 14.9  INR 1.23 1.19 1.15    Urinalysis:  Recent Labs Lab 02/14/15 2130 02/15/15 1213  COLORURINE YELLOW YELLOW  LABSPEC 1.018 1.019  PHURINE 5.0 5.0  GLUCOSEU 250* NEGATIVE  HGBUR SMALL* NEGATIVE  BILIRUBINUR NEGATIVE NEGATIVE  KETONESUR 15* 15*  PROTEINUR >300* 100*  UROBILINOGEN 0.2 1.0  NITRITE NEGATIVE NEGATIVE  LEUKOCYTESUR NEGATIVE SMALL*    Lipid Panel:     Component Value Date/Time   CHOL 144 02/01/2008 2118   TRIG 199* 02/17/2015 0445   HDL 42 02/01/2008 2118   CHOLHDL 3.4 Ratio  02/01/2008 2118   VLDL 18 02/01/2008 2118   LDLCALC 84 02/01/2008 2118    HgbA1C:  Lab Results  Component Value Date   HGBA1C 8.7 02/03/2015    Urine Drug Screen:  No results found for: LABOPIA, COCAINSCRNUR, LABBENZ, AMPHETMU, THCU, LABBARB  Alcohol Level: No results for input(s): ETH in the last 168 hours.  Other results: EKG: normal sinus rhythm.  Imaging: Ct Head Wo Contrast  02/17/2015   CLINICAL DATA:  Altered mental status. The patient is not moving his left side much.  EXAM: CT HEAD WITHOUT CONTRAST  TECHNIQUE: Contiguous axial images were obtained from the base of the skull through the vertex without intravenous contrast.  COMPARISON:  None.  FINDINGS: There is low-density in the anterior right thalamus. There is no midline shift or hydrocephalus. There is no acute hemorrhage. The bony calvarium is intact. The visualized sinuses are clear.  IMPRESSION: Low-density in the anterior  right thalamus suspicious for acute infarct. Further evaluation with brain MRI is recommended.   Electronically Signed   By: Abelardo Diesel M.D.   On: 02/17/2015 19:06    Assessment: 54 y.o. male hx of HTN, DM presenting to the ED with hemoptysis, requiring intubation for respiratory decline. Neurology consulted after CT head shows a probable acute infarct in the right thalamus.   Plan: 1. HgbA1c, fasting lipid panel 2. MRI, MRA  of the brain without contrast 3. PT consult, OT consult, Speech consult 4. Echocardiogram completed 9/28- shows EF [id number], grade 2 diastolic dysfunction 5. Carotid dopplers 6. Prophylactic therapy-ASA 325mg  daily 7. Risk factor modification 8. Telemetry monitoring 9. Frequent neuro checks 10. NPO until RN stroke swallow screen   Jim Like, DO [facility] [phone]  If 7pm- 7am, please page neurology on call as listed in [place]. 02/17/2015, 7:47 PM

## 2015-02-17 NOTE — Progress Notes (Addendum)
RT attempted to wean patient on ventilator. Patient did not tolerate well. RR of only 5 per minute and tidal volume reaching on 150-200. MD aware. Will attempt again tomorrow morning. RT will continue to monitor.

## 2015-02-17 NOTE — Progress Notes (Signed)
RT assisted with patient transport to CT on vent and back to 2M10. Patient's vitals remained stable throughout. RT will continue to monitor.

## 2015-02-17 NOTE — Progress Notes (Signed)
[facility] Progress Note    Assessment/ Plan:   1. AKI on CKD Stage IV: Likely secondary to ischemic ATN from sepsis. Cr has plateaued and now coming down from 5.47>5.90>5.76. UOP has increased with 1,465 ml over the past 24 hours. No emergent dialysis needs at this time. Potassium is stable. Expect to see good renal recovery.  2. Hemoptysis/Acute Respiratory Failure: Patient presented with 1-2 days of hemoptysis and dyspnea requiring intubation and ventilation. CT Chest concerning for diffuse alveolar hemorrhage and possibly a PNA. Bronchoscopy performed 9/28. Both sets of BAL, fungal, and AFB cultures negative so far (9/27 and 9/28). Blood cultures negative. Resp culture 9/27 with rare GPC in pairs. Currently on Vanc and Zosyn. Management per primary team.  3. Hyperkalemia: K stable at 3.7 this morning. Continue to monitor.  4. Hypophosphatemia: Ph 7.0 from 5.7. Continue to monitor.  5. Normocytic Anemia: Hgb 9.1 from 8.6. Likely anemia of chronic disease given his CKD plus blood loss from hemoptysis.  Continue to monitor. Transfuse for Hgb < 7.0.  6. HTN: BPs [id number] systolic. Not on pressors. Patient is on Amlodipine 10 mg daily at home. Currently has Hydralazine PRN for SBP > 180.  7. Type 2 DM: Last HbA1c 8.7 on 9/16. CBGs controlled in hospital in 80s-160s mostly. He is on Glipizide 10 mg daily and Lantus 50 units QHS at home. Currently on Levemir 45 units QHS with moderate ISS. Management per primary team.   Subjective:   No acute events overnight. Cr has plateaued and UOP has picked up.    Objective:   BP 128/60 mmHg  Pulse 60  Temp(Src) 98.2 F (36.8 C) (Oral)  Resp 26  Ht 5\' 10"  (1.778 m)  Wt 188 lb 15 oz (85.7 kg)  BMI 27.11 kg/m2  SpO2 100%  Intake/Output Summary (Last 24 hours) at 02/17/15 1206 Last data filed at 02/17/15 1200  Gross per 24 hour  Intake 1217.53 ml  Output   1590 ml  Net -372.47 ml   Weight change: -7.1 oz (-0.2 kg)  Physical  Exam: Gen: sedated on vent CVS: bradycardic, no m/g/r Resp: coarse breath sounds bilaterally Abd: BS+, soft, non-distended Ext: warm, 1+ peripheral edema   Imaging: No results found.  Labs: BMET  Recent Labs Lab 02/14/15 1604 02/14/15 1617 02/14/15 1618 02/14/15 2114 02/15/15 0025 02/15/15 0450 02/15/15 1215 02/16/15 0445 02/17/15 0445  NA 137 137  --  132* 138 138 140 140 145  K 5.6* 5.4*  --  >7.5* 5.6* 5.3* 5.3* 4.0 3.7  CL 106 109  --  104 109 107 108 104 106  CO2 15*  --   --  19* 19* 21* 25 24 26   GLUCOSE 363* 376*  --  440* 344* 134* 144* 219* 144*  BUN 43* 45*  --  47* 50* 53* 61* 77* 84*  CREATININE 4.28* 4.30* 4.50* 4.75* 5.11* 5.37* 5.47* 5.90* 5.76*  CALCIUM 8.6*  --   --  7.5* 8.1* 8.3* 8.1* 8.0* 7.7*  PHOS  --   --   --  3.3  --  1.5*  --  5.7* 7.0*   CBC  Recent Labs Lab 02/14/15 1604  02/14/15 2114 02/15/15 0450 02/16/15 0445 02/17/15 0445  WBC 20.9*  --  12.5* 16.4* 10.1 10.1  NEUTROABS 19.5*  --   --   --  9.5* 9.6*  HGB 11.5*  < > 10.7* 10.6* 8.6* 9.1*  HCT 33.7*  < > 31.6* 30.4* 24.9* 25.9*  MCV 83.4  --  84.0 84.0 83.6 83.5  PLT 208  --  145* 142* 115* 118*  < > = values in this interval not displayed.  Medications:    . antiseptic oral rinse  7 mL Mouth Rinse QID  . aspirin  325 mg Oral Daily  . chlorhexidine gluconate  15 mL Mouth Rinse BID  . insulin aspart  0-15 Units Subcutaneous 6 times per day  . insulin detemir  45 Units Subcutaneous QHS  . methylPREDNISolone (SOLU-MEDROL) injection  125 mg Intravenous Q6H  . pantoprazole (PROTONIX) IV  40 mg Intravenous QHS  . piperacillin-tazobactam (ZOSYN)  IV  2.25 g Intravenous 4 times per day  . sodium chloride  3 mL Intravenous Q12H  . vancomycin  1,000 mg Intravenous Q48H      Albin Felling, MD, MPH Internal Medicine Resident, PGY-II Pager: [phone]   02/17/2015, 12:06 PM

## 2015-02-17 NOTE — Progress Notes (Addendum)
eLink Physician-Brief Progress Note Patient Name: Russell Price DOB: [birth year] MRN: [id number]   Date of Service  02/17/2015  HPI/Events of Note  CT head reviewed Work up for vasculitis   eICU Interventions  wil need MRI brain likely Will consult neuro consult for further management On asa     Intervention Category Major Interventions: OtherRaylene Miyamoto. 02/17/2015, 7:32 PM

## 2015-02-18 ENCOUNTER — Inpatient Hospital Stay (HOSPITAL_COMMUNITY): Payer: BLUE CROSS/BLUE SHIELD

## 2015-02-18 DIAGNOSIS — I6330 Cerebral infarction due to thrombosis of unspecified cerebral artery: Secondary | ICD-10-CM | POA: Insufficient documentation

## 2015-02-18 LAB — RESPIRATORY VIRUS PANEL
ADENOVIRUS: NEGATIVE
Influenza A: NEGATIVE
Influenza B: NEGATIVE
Metapneumovirus: NEGATIVE
PARAINFLUENZA 1 A: NEGATIVE
Parainfluenza 2: NEGATIVE
Parainfluenza 3: NEGATIVE
RESPIRATORY SYNCYTIAL VIRUS A: NEGATIVE
RESPIRATORY SYNCYTIAL VIRUS B: NEGATIVE
RHINOVIRUS: NEGATIVE

## 2015-02-18 LAB — TRIGLYCERIDES: TRIGLYCERIDES: 290 mg/dL — AB (ref <150)

## 2015-02-18 LAB — GLUCOSE, CAPILLARY
GLUCOSE-CAPILLARY: 102 mg/dL — AB (ref 65–99)
GLUCOSE-CAPILLARY: 162 mg/dL — AB (ref 65–99)
GLUCOSE-CAPILLARY: 164 mg/dL — AB (ref 65–99)
GLUCOSE-CAPILLARY: 91 mg/dL (ref 65–99)
Glucose-Capillary: 25 mg/dL — CL (ref 65–99)
Glucose-Capillary: 45 mg/dL — ABNORMAL LOW (ref 65–99)
Glucose-Capillary: 112 mg/dL — ABNORMAL HIGH (ref 65–99)

## 2015-02-18 LAB — CBC WITH DIFFERENTIAL/PLATELET
BASOS ABS: 0 10*3/uL (ref 0.0–0.1)
Basophils Relative: 0 %
Eosinophils Absolute: 0 10*3/uL (ref 0.0–0.7)
Eosinophils Relative: 0 %
HEMATOCRIT: 27 % — AB (ref 39.0–52.0)
HEMOGLOBIN: 9.4 g/dL — AB (ref 13.0–17.0)
LYMPHS PCT: 8 %
Lymphs Abs: 0.7 10*3/uL (ref 0.7–4.0)
MCH: 29.3 pg (ref 26.0–34.0)
MCHC: 34.8 g/dL (ref 30.0–36.0)
MCV: 84.1 fL (ref 78.0–100.0)
MONO ABS: 0.5 10*3/uL (ref 0.1–1.0)
Monocytes Relative: 6 %
NEUTROS ABS: 7.8 10*3/uL — AB (ref 1.7–7.7)
NEUTROS PCT: 86 %
Platelets: 110 10*3/uL — ABNORMAL LOW (ref 150–400)
RBC: 3.21 MIL/uL — AB (ref 4.22–5.81)
RDW: 13.2 % (ref 11.5–15.5)
WBC: 9 10*3/uL (ref 4.0–10.5)

## 2015-02-18 LAB — RENAL FUNCTION PANEL
ALBUMIN: 2.3 g/dL — AB (ref 3.5–5.0)
ANION GAP: 12 (ref 5–15)
BUN: 91 mg/dL — ABNORMAL HIGH (ref 6–20)
CHLORIDE: 107 mmol/L (ref 101–111)
CO2: 27 mmol/L (ref 22–32)
Calcium: 7.5 mg/dL — ABNORMAL LOW (ref 8.9–10.3)
Creatinine, Ser: 5.14 mg/dL — ABNORMAL HIGH (ref 0.61–1.24)
GFR calc Af Amer: 13 mL/min — ABNORMAL LOW (ref >60)
GFR, EST NON AFRICAN AMERICAN: 11 mL/min — AB (ref >60)
Glucose, Bld: 116 mg/dL — ABNORMAL HIGH (ref 65–99)
PHOSPHORUS: 5.8 mg/dL — AB (ref 2.5–4.6)
Potassium: 3.3 mmol/L — ABNORMAL LOW (ref 3.5–5.1)
Sodium: 146 mmol/L — ABNORMAL HIGH (ref 135–145)

## 2015-02-18 LAB — LIPID PANEL
CHOLESTEROL: 191 mg/dL (ref 0–200)
HDL: 23 mg/dL — ABNORMAL LOW (ref >40)
LDL Cholesterol: 110 mg/dL — ABNORMAL HIGH (ref 0–99)
TRIGLYCERIDES: 290 mg/dL — AB (ref <150)
Total CHOL/HDL Ratio: 8.3 RATIO
VLDL: 58 mg/dL — AB (ref 0–40)

## 2015-02-18 LAB — PROTIME-INR
INR: 1.28 (ref 0.00–1.49)
PROTHROMBIN TIME: 16.1 s — AB (ref 11.6–15.2)

## 2015-02-18 LAB — VANCOMYCIN, TROUGH: VANCOMYCIN TR: 15 ug/mL (ref 10.0–20.0)

## 2015-02-18 LAB — APTT: APTT: 24 s (ref 24–37)

## 2015-02-18 LAB — MAGNESIUM: Magnesium: 2.5 mg/dL — ABNORMAL HIGH (ref 1.7–2.4)

## 2015-02-18 MED ORDER — DEXTROSE 50 % IV SOLN
INTRAVENOUS | Status: AC
  Filled 2015-02-18: qty 50

## 2015-02-18 MED ORDER — DEXTROSE 50 % IV SOLN
50.0000 mL | Freq: Once | INTRAVENOUS | Status: AC
  Administered 2015-02-18: 50 mL via INTRAVENOUS

## 2015-02-18 MED ORDER — DEXTROSE 50 % IV SOLN
INTRAVENOUS | Status: AC
Start: 2015-02-18 — End: 2015-02-18
  Filled 2015-02-18: qty 50

## 2015-02-18 MED ORDER — DEXTROSE 5 % IV SOLN
INTRAVENOUS | Status: DC
  Administered 2015-02-18: 12:00:00 via INTRAVENOUS

## 2015-02-18 MED ORDER — CHLORHEXIDINE GLUCONATE 0.12 % MT SOLN
15.0000 mL | Freq: Two times a day (BID) | OROMUCOSAL | Status: DC
  Administered 2015-02-18 – 2015-02-21 (×5): 15 mL via OROMUCOSAL
  Filled 2015-02-18 (×4): qty 15

## 2015-02-18 MED ORDER — ATORVASTATIN CALCIUM 40 MG PO TABS
40.0000 mg | ORAL_TABLET | Freq: Every day | ORAL | Status: DC
Start: 2015-02-18 — End: 2015-02-23
  Administered 2015-02-18 – 2015-02-22 (×5): 40 mg via ORAL
  Filled 2015-02-18 (×5): qty 1

## 2015-02-18 MED ORDER — ATROPINE SULFATE 0.1 MG/ML IJ SOLN
INTRAMUSCULAR | Status: AC
  Filled 2015-02-18: qty 10

## 2015-02-18 MED ORDER — DEXTROSE 50 % IV SOLN
50.0000 mL | Freq: Once | INTRAVENOUS | Status: AC
Start: 2015-02-18 — End: 2015-02-18
  Administered 2015-02-18: 50 mL via INTRAVENOUS

## 2015-02-18 MED ORDER — POTASSIUM CHLORIDE 20 MEQ/15ML (10%) PO SOLN
40.0000 meq | Freq: Once | ORAL | Status: DC
  Filled 2015-02-18: qty 30

## 2015-02-18 MED ORDER — DEXTROSE 50 % IV SOLN
25.0000 mL | Freq: Once | INTRAVENOUS | Status: AC
  Administered 2015-02-18: 25 mL via INTRAVENOUS
  Filled 2015-02-18: qty 50

## 2015-02-18 MED ORDER — POTASSIUM CHLORIDE 20 MEQ/15ML (10%) PO SOLN
40.0000 meq | Freq: Once | ORAL | Status: AC
  Administered 2015-02-18: 40 meq
  Filled 2015-02-18: qty 30

## 2015-02-18 MED ORDER — CETYLPYRIDINIUM CHLORIDE 0.05 % MT LIQD
7.0000 mL | Freq: Two times a day (BID) | OROMUCOSAL | Status: DC
  Administered 2015-02-18 – 2015-02-22 (×6): 7 mL via OROMUCOSAL

## 2015-02-18 NOTE — Progress Notes (Signed)
Pt blood glucose at 1000 via central line draw was 91. Will resume normal CBG schedule

## 2015-02-18 NOTE — Evaluation (Signed)
Clinical/Bedside Swallow Evaluation Patient Details  Name: Russell Price MRN: [id number] Date of Birth: [birth year]  Today's Date: 02/18/2015 Time: SLP Start Time (ACUTE ONLY): 1640 SLP Stop Time (ACUTE ONLY): [id number] SLP Time Calculation (min) (ACUTE ONLY): 18 min  Past Medical History:  Past Medical History  Diagnosis Date  . Diabetes mellitus without complication     followed by Dr Chalmers Cater  . Hypertension     followed by [facility]   Past Surgical History: History reviewed. No pertinent past surgical history. HPI:  Pt is a 54 y.o. male (Arabic is primary language, reportedly Arabic is "ok"). Admitted 9/27 with hemoptysis, later developing dyspnea and tachycardia and was intubated with copious bloody secretions. Recently also had multiple admissions for dizziness. Head CT on 9/30 was suspicious for acute infarct of anterior R thalamus, MRI was ordered. Initial CXR showed bilateral perihilar infiltrates; CXR 10/1 showed decreased bilateral airspace disease with no definite pleural effusion noted. Bedside swallow eval ordered due to prolonged intubation- extubated 10/1.   Assessment / Plan / Recommendation Clinical Impression  Pt currently impulsive with PO intake and had a throat clear x1 after drinking 1/2 cup of thin liquid; no other s/s of aspiration noted; swallow was timely with adequate hyolaryngeal excursion and O2s remained at 100%. Provided frequent cues for pt to take small sips with pt sometimes following direction- not sure whether this was impulsivity or not understanding. Risk of aspiration appears moderate at this time given prolonged intubation/ current decreased respiratory status and rapid rate of PO intake. Recommend initiating dysphagia 3 diet, thin liquids, meds whole with liquid. Provide full supervision during meals to cue small bites/ sips and ensure pt is upright 90 degrees. Will f/u at least x1 for diet tolerance/ consider advancement and to complete SLE  (unable to at this time as pt needed to leave for MRI).    Aspiration Risk  Moderate    Diet Recommendation Dysphagia 3 (Mech soft);Thin   Medication Administration: Whole meds with liquid Compensations: Slow rate;Small sips/bites    Other  Recommendations Oral Care Recommendations: Oral care BID   Follow Up Recommendations       Frequency and Duration min 1 x/week  1 week   Pertinent Vitals/Pain none    SLP Swallow Goals     Swallow Study Prior Functional Status       General Other Pertinent Information: Pt is a 54 y.o. male (Arabic is primary language, reportedly Arabic is "ok"). Admitted 9/27 with hemoptysis, later developing dyspnea and tachycardia and was intubated with copious bloody secretions. Recently also had multiple admissions for dizziness. Head CT on 9/30 was suspicious for acute infarct of anterior R thalamus, MRI was ordered. Initial CXR showed bilateral perihilar infiltrates; CXR 10/1 showed decreased bilateral airspace disease with no definite pleural effusion noted. Bedside swallow eval ordered due to prolonged intubation- extubated 10/1. Type of Study: Bedside swallow evaluation Diet Prior to this Study: NPO Temperature Spikes Noted: No Respiratory Status: Supplemental O2 delivered via (comment) History of Recent Intubation: Yes Length of Intubations (days): 4 days Date extubated: 02/18/15 Behavior/Cognition: Alert;Cooperative;Pleasant mood Oral Cavity - Dentition: Adequate natural dentition/normal for age Self-Feeding Abilities: Able to feed self Patient Positioning: Upright in bed Baseline Vocal Quality: Low vocal intensity Volitional Cough: Strong Volitional Swallow: Able to elicit    Oral/Motor/Sensory Function Overall Oral Motor/Sensory Function: Appears within functional limits for tasks assessed Labial ROM: Within Functional Limits Labial Symmetry: Within Functional Limits Labial Strength: Within Functional Limits Lingual ROM: Within  Functional Limits Lingual Symmetry: Within Functional Limits Lingual Strength: Within Functional Limits   Ice Chips     Thin Liquid Thin Liquid: Impaired Presentation: Cup;Straw Pharyngeal  Phase Impairments: Throat Clearing - Immediate;Other (comments) (x1 following large consumption (1/2 cup))    Nectar Thick Nectar Thick Liquid: Not tested   Honey Thick Honey Thick Liquid: Not tested   Puree Puree: Within functional limits Presentation: Self Fed;Spoon   Solid   GO    Solid: Within functional limits Presentation: Self Patton Salles, Amy K, [place], CCC-SLP 02/18/2015,4:02 PM [phone]

## 2015-02-18 NOTE — Procedures (Signed)
Extubation Procedure Note  Patient Details:   Name: Russell Price DOB: 1960-10-18 MRN: [id number]   Airway Documentation:     Evaluation  O2 sats: stable throughout Complications: No apparent complications Patient did tolerate procedure well. Bilateral Breath Sounds: Clear, Diminished Suctioning: Oral, Airway Yes   Patient extubated to [place]. Vital signs stable throughout. No complications. Patient tolerated well. RN and wife at bedside. RT will continue to monitor.  Mcneil Sober 02/18/2015, 12:08 PM

## 2015-02-18 NOTE — Progress Notes (Signed)
Wasted 151mcg Fentanyl in sink with Nettie Elm RN

## 2015-02-18 NOTE — Progress Notes (Signed)
Pt 0815 CBG via fingerstick was 29, rechecked central line draw revealed blood sugar of 25. Order placed for D50. Gave 16mL at 0826. Will recheck in 30 min.

## 2015-02-18 NOTE — Progress Notes (Signed)
Pt 1400 CBG was 56. NP (Tammy) notified. Orders given to give 31mL D50% as well as increase D5 rate to 55mL/hr. Will recheck blood glucose in 30 min and continue to monitor.

## 2015-02-18 NOTE — Progress Notes (Signed)
STROKE TEAM PROGRESS NOTE  HPI Russell Price is an 54 y.o. male hx of HTN, DM who presented to ED on 9/27 with hemoptysis, shortly after developed dyspnea and tachycardia. Intubated in the ED for airway protection. Unclear etiology of presentation. Had auto-immune panel sent which was unremarkable. Started on high dose steroids and broad spectrum antibiotics. Neurology consulted after CT head imaging showed a likely infarct in the anterior right thalamus (imaging reviewed). Hospital course complicated by AKI on CKD for which nephrology is following.    Date last known well: unclear Time last known well: unclear tPA Given: no, unclear LSW Modified Rankin: 1   SUBJECTIVE (INTERVAL HISTORY) Two family members at the bedside. The patient is intubated but alert and follows commands. His hemoptysis is improving.   OBJECTIVE Temp:  [97.6 F (36.4 C)-98.4 F (36.9 C)] 97.6 F (36.4 C) (10/01 0328) Pulse Rate:  [36-72] 58 (10/01 0635) Cardiac Rhythm:  [-] Sinus bradycardia (10/01 0400) Resp:  [13-32] 22 (10/01 0635) BP: (128-163)/(56-86) 129/83 mmHg (10/01 0635) SpO2:  [100 %] 100 % (10/01 0635) FiO2 (%):  [30 %] 30 % (10/01 0400) Weight:  [84.1 kg (185 lb 6.5 oz)] 84.1 kg (185 lb 6.5 oz) (10/01 0330)  CBC:  Recent Labs Lab 02/17/15 0445 02/18/15 0343  WBC 10.1 9.0  NEUTROABS 9.6* 7.8*  HGB 9.1* 9.4*  HCT 25.9* 27.0*  MCV 83.5 84.1  PLT 118* 110*    Basic Metabolic Panel:  Recent Labs Lab 02/17/15 0445 02/18/15 0343  NA 145 146*  K 3.7 3.3*  CL 106 107  CO2 26 27  GLUCOSE 144* 116*  BUN 84* 91*  CREATININE 5.76* 5.14*  CALCIUM 7.7* 7.5*  MG 2.3 2.5*  PHOS 7.0* 5.8*    Lipid Panel:    Component Value Date/Time   CHOL 191 02/18/2015 0344   TRIG 290* 02/18/2015 0344   HDL 23* 02/18/2015 0344   CHOLHDL 8.3 02/18/2015 0344   VLDL 58* 02/18/2015 0344   LDLCALC 110* 02/18/2015 0344   HgbA1c:  Lab Results  Component Value Date   HGBA1C 8.7 02/03/2015   Urine  Drug Screen: No results found for: LABOPIA, COCAINSCRNUR, LABBENZ, AMPHETMU, THCU, LABBARB    IMAGING  Ct Head Wo Contrast 02/17/2015    Low-density in the anterior right thalamus suspicious for acute infarct. Further evaluation with brain MRI is recommended.      MRI / MRA - pending   PHYSICAL EXAM  Pleasant African-male who is intubated but not sedated. . Afebrile. Head is nontraumatic. Neck is supple without bruit.    Cardiac exam no murmur or gallop. Lungs are clear to auscultation. Distal pulses are well felt.  Neurological Exam : Awake alert patient able to follow commands quite well. Intubated. Extraocular movements are full range. Slight restriction of upgaze. Pupils irregular but reactive. Vision acuity cannot be reliably tested. Blinks to threat bilaterally. No facial weakness. Tongue midline. Good cough and gag. Able to move all 4 extremities against gravity but mild weakness of right leg but effort is variable. Able to feel in all 4 extremities. Coordination and gait cannot be reliably tested.   ASSESSMENT/PLAN [name] is a 54 y.o. male with history of hypertension, diabetes, and chronic kidney disease presenting with hemoptysis, dyspnea, and tachycardia.Marland Kitchen He did not receive IV t-PA due to unknown time of onset.   Stroke:  Non-dominant infarct rule out PFO.  Resultant  suspect mild right hemiparesis and upgaze restriction  MRI  pending  MRA  pending  Carotid Doppler pending  2D Echo EF 45-50%. No cardiac source of emboli identified.  Bilateral lower extremity duplex - pending  LDL 110  HgbA1c pending  VTE prophylaxis - SCDs  Diet NPO time specified  no antithrombotic prior to admission, now on aspirin 325 mg orally every day  Patient counseled to be compliant with his antithrombotic medications  Ongoing aggressive stroke risk factor management  Therapy recommendations: Pending  Disposition: Pending  Hypertension  Stable  Permissive  hypertension (OK if < 220/120) but gradually normalize in 5-7 days  Hyperlipidemia  Home meds: No lipid lowering medications prior to admission  LDL 110, goal < 70  Add Lipitor 40 mg daily  Continue statin at discharge  Diabetes  HgbA1c pending, goal < 7.0  Uncontrolled  Other Stroke Risk Factors  Cigarette smoker, quit smoking 31 years ago.   Other Active Problems  Anemia  Mild hypokalemia  Hospital day # 4  I have personally examined this patient, reviewed notes, independently viewed imaging studies, participated in medical decision making and plan of care. I have made any additions or clarifications directly to the above note. Agree with note above. He presented with hemoptysis secondary to recurrent coughing and brain imaging shows a tiny right anterior thalamic infarct etiology to be determined but strong possibility of paradoxical embolism and evaluation for lower extremity venous clots and PFO is necessary. Patient is at risk for recurrent strokes, neurological worsening and needs ongoing stroke evaluation and aggressive risk factor control. I had a long discussion with the patient's wife and daughter and patient at the bedside about his stroke, plan for evaluation, discuss results of imaging studies and answered questions. Discussed with critical care physician Asst.Provider, please review the patient's drug allergy history. Some issues need clarification. LE venous dopplers for DVT This patient is critically ill and at significant risk of neurological worsening, death and care requires constant monitoring of vital signs, hemodynamics,respiratory and cardiac monitoring, extensive review of multiple databases, frequent neurological assessment, discussion with family, other specialists and medical decision making of high complexity.I have made any additions or clarifications directly to the above note.This critical care time does not reflect procedure time, or teaching time or  supervisory time of PA/NP/Med Resident etc but could involve care discussion time.  I spent 30 minutes of neurocritical care time  in the care of  this patient.     Antony Contras, MD Medical Director [facility] Pager: [phone] 02/18/2015 11:40 AM     To contact Stroke Continuity provider, please refer to [email or web address] After hours, contact General Neurology

## 2015-02-18 NOTE — Progress Notes (Signed)
ANTIBIOTIC CONSULT NOTE  Pharmacy Consult for vancomycin Indication: rule out pneumonia  No Known Allergies  Patient Measurements: Height: 5\' 10"  (177.8 cm) Weight: 185 lb 6.5 oz (84.1 kg) IBW/kg (Calculated) : 73  Vital Signs: Temp: 99.6 F (37.6 C) (10/01 2026) Temp Source: Oral (10/01 2026) BP: 159/60 mmHg (10/01 2030) Pulse Rate: 69 (10/01 2030) Intake/Output from previous day: 09/30 0701 - 10/01 0700 In: 1087.3 [I.V.:777.3; NG/GT:110; IV Piggyback:200] Out: 2040 [Urine:2040] Intake/Output from this shift:    Labs:  Recent Labs  02/16/15 0445 02/17/15 0445 02/18/15 0343  WBC 10.1 10.1 9.0  HGB 8.6* 9.1* 9.4*  PLT 115* 118* 110*  CREATININE 5.90* 5.76* 5.14*   Estimated Creatinine Clearance: 16.6 mL/min (by C-G formula based on Cr of 5.14).  Recent Labs  02/18/15 1859  [place] 15     Microbiology: Recent Results (from the past 720 hour(s))  Culture, blood (routine x 2)     Status: None (Preliminary result)   Collection Time: 02/14/15  4:04 PM  Result Value Ref Range Status   Specimen Description BLOOD LEFT ARM  Final   Special Requests BOTTLES DRAWN AEROBIC AND ANAEROBIC 5CC  Final   Culture NO GROWTH 4 DAYS  Final   Report Status PENDING  Incomplete  Culture, bal-quantitative     Status: None   Collection Time: 02/14/15  5:19 PM  Result Value Ref Range Status   Specimen Description BRONCHIAL ALVEOLAR LAVAGE  Final   Special Requests NONE  Final   Gram Stain   Final    RARE WBC PRESENT, PREDOMINANTLY PMN NO SQUAMOUS EPITHELIAL CELLS SEEN NO ORGANISMS SEEN Performed at [facility]   Final    40,000 COLONIES/ML Performed at [facility]    Culture   Final    Non-Pathogenic Oropharyngeal-type Flora Isolated. Performed at [facility]    Report Status 02/17/2015 FINAL  Final  Fungus Culture with Smear     Status: None (Preliminary result)   Collection Time: 02/14/15  5:19 PM  Result Value Ref Range  Status   Specimen Description BRONCHIAL ALVEOLAR LAVAGE  Final   Special Requests NONE  Final   Fungal Smear   Final    NO YEAST OR FUNGAL ELEMENTS SEEN Performed at [facility]    Culture   Final    CULTURE IN PROGRESS FOR FOUR WEEKS Performed at [facility]    Report Status PENDING  Incomplete  Gram stain     Status: None   Collection Time: 02/14/15  5:19 PM  Result Value Ref Range Status   Specimen Description BRONCHIAL ALVEOLAR LAVAGE  Final   Special Requests NONE  Final   Gram Stain   Final    RARE WBC PRESENT,BOTH PMN AND MONONUCLEAR NO ORGANISMS SEEN    Report Status 02/14/2015 FINAL  Final  AFB culture with smear     Status: None (Preliminary result)   Collection Time: 02/14/15  5:19 PM  Result Value Ref Range Status   Specimen Description BRONCHIAL ALVEOLAR LAVAGE  Final   Special Requests NONE  Final   Acid Fast Smear   Final    NO ACID FAST BACILLI SEEN Performed at [facility]    Culture   Final    CULTURE WILL BE EXAMINED FOR 6 WEEKS BEFORE ISSUING A FINAL REPORT Performed at [facility]    Report Status PENDING  Incomplete  MRSA PCR Screening     Status: None  Collection Time: 02/14/15  8:05 PM  Result Value Ref Range Status   MRSA by PCR NEGATIVE NEGATIVE Final    Comment:        The GeneXpert MRSA Assay (FDA approved for NASAL specimens only), is one component of a comprehensive MRSA colonization surveillance program. It is not intended to diagnose MRSA infection nor to guide or monitor treatment for MRSA infections.   Culture, blood (routine x 2)     Status: None (Preliminary result)   Collection Time: 02/14/15  9:10 PM  Result Value Ref Range Status   Specimen Description BLOOD RIGHT ANTECUBITAL  Final   Special Requests IN PEDIATRIC BOTTLE 3CC  Final   Culture NO GROWTH 4 DAYS  Final   Report Status PENDING  Incomplete  AFB culture with smear     Status: None (Preliminary result)   Collection Time:  02/14/15  9:29 PM  Result Value Ref Range Status   Specimen Description BRONCHIAL ALVEOLAR LAVAGE  Final   Special Requests NONE  Final   Acid Fast Smear   Final    NO ACID FAST BACILLI SEEN Performed at [facility]    Culture   Final    CULTURE WILL BE EXAMINED FOR 6 WEEKS BEFORE ISSUING A FINAL REPORT Performed at [facility]    Report Status PENDING  Incomplete  Fungus Culture with Smear     Status: None (Preliminary result)   Collection Time: 02/14/15  9:30 PM  Result Value Ref Range Status   Specimen Description BRONCHIAL ALVEOLAR LAVAGE  Final   Special Requests NONE  Final   Fungal Smear   Final    NO YEAST OR FUNGAL ELEMENTS SEEN Performed at [facility]    Culture   Final    CULTURE IN PROGRESS FOR FOUR WEEKS Performed at [facility]    Report Status PENDING  Incomplete  Gram stain     Status: None   Collection Time: 02/14/15  9:31 PM  Result Value Ref Range Status   Specimen Description BRONCHIAL ALVEOLAR LAVAGE  Final   Special Requests NONE  Final   Gram Stain   Final    RARE WBC PRESENT, PREDOMINANTLY PMN NO ORGANISMS SEEN    Report Status 02/14/2015 FINAL  Final  Culture, respiratory (NON-Expectorated)     Status: None   Collection Time: 02/14/15  9:31 PM  Result Value Ref Range Status   Specimen Description BRONCHIAL ALVEOLAR LAVAGE  Final   Special Requests ADDED ON 02/15/15  Final   Gram Stain   Final    FEW WBC PRESENT, PREDOMINANTLY PMN RARE SQUAMOUS EPITHELIAL CELLS PRESENT RARE GRAM POSITIVE COCCI IN PAIRS Performed at [facility]    Culture   Final    Non-Pathogenic Oropharyngeal-type Flora Isolated. Performed at [facility]    Report Status 02/17/2015 FINAL  Final  AFB culture with smear     Status: None (Preliminary result)   Collection Time: 02/15/15 10:30 AM  Result Value Ref Range Status   Specimen Description BRONCHIAL ALVEOLAR LAVAGE  Final   Special Requests NONE  Final   Acid  Fast Smear   Final    NO ACID FAST BACILLI SEEN Performed at [facility]    Culture   Final    CULTURE WILL BE EXAMINED FOR 6 WEEKS BEFORE ISSUING A FINAL REPORT Performed at [facility]    Report Status PENDING  Incomplete  Fungus Culture with Smear  Status: None (Preliminary result)   Collection Time: 02/15/15 10:30 AM  Result Value Ref Range Status   Specimen Description BRONCHIAL ALVEOLAR LAVAGE  Final   Special Requests NONE  Final   Fungal Smear   Final    NO YEAST OR FUNGAL ELEMENTS SEEN Performed at [facility]    Culture   Final    CULTURE IN PROGRESS FOR FOUR WEEKS Performed at [facility]    Report Status PENDING  Incomplete  Culture, bal-quantitative     Status: None   Collection Time: 02/15/15 10:55 AM  Result Value Ref Range Status   Specimen Description BRONCHIAL ALVEOLAR LAVAGE  Final   Special Requests NONE  Final   Gram Stain   Final    MODERATE WBC PRESENT, PREDOMINANTLY PMN NO SQUAMOUS EPITHELIAL CELLS SEEN NO ORGANISMS SEEN Performed at [place] NO GROWTH Performed at [facility]   Final   Culture   Final    NO GROWTH 2 DAYS Performed at [facility]    Report Status 02/17/2015 FINAL  Final   Assessment: 79 yom presented to the ED with SOB and hemoptysis requiring intubation>>now extubated. Continuing on empiric broad-spectrum antibiotics for possible pneumonia vs. DAH. Pt is afebrile, wbc wnl. Scr 4.5 on admit, peaked at 5.76, now decreased to 5.14. Known history of CKD.   Vanc 9/27>> Zosyn 9/27>>  9/28 BAL>>neg 9/27 BAL>> npof 9/27 BCx2>> ngtd  A vancomycin trough tonight is therapeutic at 65mcg/mL  Goal of Therapy:  Vancomycin trough level 15-20 mcg/ml  Plan:  - continue Vancomycin 1g IV Q48H  - F/u renal fxn, C&S, clinical status   Sorin Frimpong D. Laiken Nohr, PharmD, BCPS Clinical Pharmacist Pager: [phone] 02/18/2015 9:10 PM

## 2015-02-18 NOTE — Progress Notes (Signed)
PULMONARY / CRITICAL CARE MEDICINE   Name: Russell Price MRN: [id number] DOB: 05/27/1960    ADMISSION DATE:  02/14/2015 CONSULTATION DATE:  02/14/2015  REFERRING MD :  EDP Mackuen  CHIEF COMPLAINT:  SOB  INITIAL PRESENTATION: 54 year old [place] male with no cardiac history presented with SOB and hemoptysis. CXR showed R>L edema. He was intubated in ED for airway protection in setting of copious bloody secretions. PCCM to admit.   STUDIES:  CT chest 9/27 -   Patchy bilateral consolidation. No pathologic mediastinal LAD. Portable CXR 9/28 - ETT in good position & Right IJ in good position. Bilateral opacities R>L. Renal U/S 9/28 - No hydronephrosis or solid mass. TTE 9/28 - EF 45-50%. Mild diffuse hypokinesis. No regional wall motion abnormality. Grade 2 diastolic dysfunction.  SIGNIFICANT EVENTS: 9/27 - Admitted to hospital 9/27 - Intubated & central line placed. Attempted BAL. 9/28 - BAL by [facility] Nephrology consulted 9/30 Neurology consult  SUBJECTIVE:   Weaning this am w/ good vol and O2 sats  Very alert, wants tube out , moves all extremities    VITAL SIGNS: Temp:  [97.6 F (36.4 C)-98.4 F (36.9 C)] 98.1 F (36.7 C) (10/01 0805) Pulse Rate:  [36-83] 83 (10/01 1000) Resp:  [13-32] 23 (10/01 1000) BP: (128-191)/(57-86) 191/81 mmHg (10/01 1000) SpO2:  [100 %] 100 % (10/01 1000) FiO2 (%):  [30 %] 30 % (10/01 0857) Weight:  [84.1 kg (185 lb 6.5 oz)] 84.1 kg (185 lb 6.5 oz) (10/01 0330) HEMODYNAMICS:   VENTILATOR SETTINGS: Vent Mode:  [-] CPAP;PSV FiO2 (%):  [30 %] 30 % Set Rate:  [22 bmp] 22 bmp Vt Set:  [410 mL] 410 mL PEEP:  [5 cmH20] 5 cmH20 Pressure Support:  [10 cmH20] 10 cmH20 Plateau Pressure:  [16 cmH20-17 cmH20] 17 cmH20 INTAKE / OUTPUT:  Intake/Output Summary (Last 24 hours) at 02/18/15 1132 Last data filed at 02/18/15 0900  Gross per 24 hour  Intake 988.39 ml  Output   2065 ml  Net -1076.61 ml    PHYSICAL EXAMINATION: General:  Comfortable. No  distress. Alert  Integument:  Warm & dry. No rash on exposed skin.  HEENT:  No scleral injection or icterus. Endotracheal tube in place.  Cardiovascular:  RRR No edema. No appreciable JVD.  Pulmonary:  Coarse breath sounds on ventilator. Symmetric chest wall rise on ventilator.  Abdomen: Soft. Normal bowel sounds. Nondistended.  Neurological: Sedated. Spontaneously moves all 4 extremities. Pupils equal and round.  Follows commands   LABS:  CBC  Recent Labs Lab 02/16/15 0445 02/17/15 0445 02/18/15 0343  WBC 10.1 10.1 9.0  HGB 8.6* 9.1* 9.4*  HCT 24.9* 25.9* 27.0*  PLT 115* 118* 110*   Coag's  Recent Labs Lab 02/16/15 0445 02/17/15 0445 02/18/15 0343  APTT 27 26 24   INR 1.19 1.15 1.28   BMET  Recent Labs Lab 02/16/15 0445 02/17/15 0445 02/18/15 0343  NA 140 145 146*  K 4.0 3.7 3.3*  CL 104 106 107  CO2 24 26 27   BUN 77* 84* 91*  CREATININE 5.90* 5.76* 5.14*  GLUCOSE 219* 144* 116*   Electrolytes  Recent Labs Lab 02/16/15 0445 02/17/15 0445 02/18/15 0343  CALCIUM 8.0* 7.7* 7.5*  MG 2.1 2.3 2.5*  PHOS 5.7* 7.0* 5.8*   Sepsis Markers  Recent Labs Lab 02/11/15 2343 02/14/15 1619 02/14/15 2118 02/14/15 2122  LATICACIDVEN 0.51 5.13*  --  3.3*  PROCALCITON  --   --  7.12  --  ABG  Recent Labs Lab 02/14/15 1837 02/15/15 0305 02/15/15 0455  PHART 7.260* 7.357 7.347*  PCO2ART 36.5 31.2* 36.2  PO2ART 86.0 227* 307*   Liver Enzymes  Recent Labs Lab 02/14/15 1604 02/16/15 0445 02/17/15 0445 02/18/15 0343  AST 36  --   --   --   ALT 19  --   --   --   ALKPHOS 92  --   --   --   BILITOT 1.0  --   --   --   ALBUMIN 3.1* 2.1* 2.3* 2.3*   Cardiac Enzymes  Recent Labs Lab 02/14/15 2300 02/15/15 0520 02/16/15 0445  TROPONINI 5.50* 11.78* 8.20*   Glucose  Recent Labs Lab 02/17/15 1934 02/18/15 0004 02/18/15 0326 02/18/15 0809 02/18/15 0813 02/18/15 0958  GLUCAP 178* 162* 102* 29* 25* 91    Imaging Ct Head Wo  Contrast  02/17/2015   CLINICAL DATA:  Altered mental status. The patient is not moving his left side much.  EXAM: CT HEAD WITHOUT CONTRAST  TECHNIQUE: Contiguous axial images were obtained from the base of the skull through the vertex without intravenous contrast.  COMPARISON:  None.  FINDINGS: There is low-density in the anterior right thalamus. There is no midline shift or hydrocephalus. There is no acute hemorrhage. The bony calvarium is intact. The visualized sinuses are clear.  IMPRESSION: Low-density in the anterior right thalamus suspicious for acute infarct. Further evaluation with brain MRI is recommended.   Electronically Signed   By: Abelardo Diesel M.D.   On: 02/17/2015 19:06     ASSESSMENT / PLAN:  PULMONARY OETT 9/27 >>> A: Acute hypoxic respiratory failure - DAH vs Pneumonia Diffuse alveolar hemorrhage - (ANA, RF, CCP, GBM, ANCA, MPO, & PR3 negative) 10/1 >weaning well , alert and f/c , moving good vol and nml O2 sats. No increased wOB .   P:   Extubate today  See ID Check cxr   CARDIOVASCULAR A:  Sinus tachycardia - resolved Hypertensive emergency - resolved  Elevated troponin -  Echo w/ mild hypokinesis , EF 45-50%  P:  Cont Statin  Consider add BB if b/p tr up  May need cards consult   RENAL A:   Acute on Chronic Renal Failure - UOP improving. Function improving. Anion Gap metabolic Acidosis - resolved Lactic Acidosis - improving Hyperkalemia - resolved Hypokalemia   P:   Monitor UOP Following Electrolytes daily  Nephrology following Replace K+  GASTROINTESTINAL A:   No acute issues  P:   NPO for now Protonix IV daily Swallow eval , if ok begin diet   HEMATOLOGIC A:   Anemia - diffuse alveolar hemorrhage. Improving. Leukocytosis - possibly infectious in etiology. Resolved.  Thrombocytopenia - likely consumption. Improving.  P:  Follow Hgb w/ CBC daily Follow platelets daily w/ CBC Transfuse per ICU guidelines SCDs Venous dopplers pending    INFECTIOUS A:   HCAP vs atypical pneumonia Leukocytosis  P:   Viral resp panel pend    BCx2 9/27 >>>  U strep >>>neg U legionella >>>neg  BAL 9/28>>>neg AFB 9/28>>> smear negative Fungal cx 9/28>>> Cytology 9/28>>>negative  Sputum BAL 9/27>>>Oral flora AFB stain and cultures> smear negative Fungal Cx 9/27 >>> Cytology 9/27 > negative  Abx: Zosyn, start date 9/27 >>> Abx: Vancomycin, start date 9/27 >>>   ENDOCRINE A:   H/O DM-2 Hypoglycemia 10/1  P:   Accu-Cheks every 4 hours  Low-dose sliding scale algorithm D/c Levemir  Begin D5 at 30 until diet started  NEUROLOGIC A:   Acute Encephalopathy Sedation needs on Ventilator  P:   DC propofol and fentanyl   FAMILY  - Updates: Family updated by me at bedside today.  - Inter-disciplinary family meet or Palliative Care meeting due by:  10/4  TODAY'S SUMMARY:  Patient with questionable pulmonary renal syndrome with renal failure and hemoptysis but autoimmune neg thus far.  Cultures are pending from specimens obtained. Bronchoalveolar lavage shows diffuse alveolar hemorrhage. His serologies have been negative.   Continuing broad-spectrum antibiotic coverage. He is weaning well , will proceede with extubation.  CT showed likely infarct in anterior right thalamus . MRI is pending later today . Venous dopplers pending . Echo showed mild hypokinesis with elevated troponin, may need cards eval going forward.  BS low , levemir stopped, start D5W until can start diet. Swallow eval pending    Tammy Parrett NP-C  [facility]  [phone]   02/18/2015 11:32 AM  Reviewed above.  He did well with SBT and extubated.  Denies chest pain, sore throat, abd pain.  Alert, follows commands, no stridor, scattered rhonchi, HR regular, abd soft, 1+ edema.  Labs, CXR from 10/01 reviewed.  He will have MRI later today.  F/u with speech therapy and then advance diet.  Continue Abx.  Updated pt's family  at bedside.  CC time by me independent of APP time 33 minutes.  Chesley Mires, MD [facility] Pulmonary/Critical Care 02/18/2015, 1:01 PM Pager:  [phone] After 3pm call: [phone]

## 2015-02-18 NOTE — Progress Notes (Signed)
[facility] ROUNDING NOTE   Subjective:   Interval History:  Excellent diuresis  Objective:  Vital signs in last 24 hours:  Temp:  [97.6 F (36.4 C)-98.4 F (36.9 C)] 98.1 F (36.7 C) (10/01 0805) Pulse Rate:  [36-86] 86 (10/01 1100) Resp:  [13-32] 20 (10/01 1100) BP: (129-193)/(57-86) 193/77 mmHg (10/01 1100) SpO2:  [100 %] 100 % (10/01 1100) FiO2 (%):  [30 %] 30 % (10/01 0857) Weight:  [84.1 kg (185 lb 6.5 oz)] 84.1 kg (185 lb 6.5 oz) (10/01 0330)  Weight change: -1.6 kg (-3 lb 8.4 oz) Filed Weights   02/16/15 0500 02/17/15 0500 02/18/15 0330  Weight: 85.9 kg (189 lb 6 oz) 85.7 kg (188 lb 15 oz) 84.1 kg (185 lb 6.5 oz)    Intake/Output: I/O last 3 completed shifts: In: 1752.4 [I.V.:1102.4; Other:20; NG/GT:130; IV Piggyback:500] Out: 2900 [Urine:2900]   Intake/Output this shift:  Total I/O In: 108 [I.V.:63; NG/GT:45] Out: 350 [Urine:350]  CVS- RRR RS- CTA ABD- BS present soft non-distended EXT- no edema Gen: sedated on vent CVS: bradycardic, no m/g/r Resp: coarse breath sounds bilaterally Abd: BS+, soft, non-distended Ext: warm, 1+ peripheral edema   Basic Metabolic Panel:  Recent Labs Lab 02/14/15 2114  02/15/15 0450 02/15/15 1215 02/16/15 0445 02/17/15 0445 02/18/15 0343  NA 132*  < > 138 140 140 145 146*  K >7.5*  < > 5.3* 5.3* 4.0 3.7 3.3*  CL 104  < > 107 108 104 106 107  CO2 19*  < > 21* 25 24 26 27   GLUCOSE 440*  < > 134* 144* 219* 144* 116*  BUN 47*  < > 53* 61* 77* 84* 91*  CREATININE 4.75*  < > 5.37* 5.47* 5.90* 5.76* 5.14*  CALCIUM 7.5*  < > 8.3* 8.1* 8.0* 7.7* 7.5*  MG 1.7  --  2.0  --  2.1 2.3 2.5*  PHOS 3.3  --  1.5*  --  5.7* 7.0* 5.8*  < > = values in this interval not displayed.  Liver Function Tests:  Recent Labs Lab 02/14/15 1604 02/16/15 0445 02/17/15 0445 02/18/15 0343  AST 36  --   --   --   ALT 19  --   --   --   ALKPHOS 92  --   --   --   BILITOT 1.0  --   --   --   PROT 6.6  --   --   --    ALBUMIN 3.1* 2.1* 2.3* 2.3*   No results for input(s): LIPASE, AMYLASE in the last 168 hours. No results for input(s): AMMONIA in the last 168 hours.  CBC:  Recent Labs Lab 02/14/15 1604  02/14/15 2114 02/15/15 0450 02/16/15 0445 02/17/15 0445 02/18/15 0343  WBC 20.9*  --  12.5* 16.4* 10.1 10.1 9.0  NEUTROABS 19.5*  --   --   --  9.5* 9.6* 7.8*  HGB 11.5*  < > 10.7* 10.6* 8.6* 9.1* 9.4*  HCT 33.7*  < > 31.6* 30.4* 24.9* 25.9* 27.0*  MCV 83.4  --  84.0 84.0 83.6 83.5 84.1  PLT 208  --  145* 142* 115* 118* 110*  < > = values in this interval not displayed.  Cardiac Enzymes:  Recent Labs Lab 02/14/15 2118 02/14/15 2300 02/15/15 0520 02/16/15 0445  TROPONINI 2.99* 5.50* 11.78* 8.20*    BNP: Invalid input(s): POCBNP  CBG:  Recent Labs Lab 02/18/15 0326 02/18/15 0809 02/18/15 0813 02/18/15 0958 02/18/15 1149  GLUCAP 102* 29* 25*  52 45*    Microbiology: Results for orders placed or performed during the hospital encounter of 02/14/15  Culture, blood (routine x 2)     Status: None (Preliminary result)   Collection Time: 02/14/15  4:04 PM  Result Value Ref Range Status   Specimen Description BLOOD LEFT ARM  Final   Special Requests BOTTLES DRAWN AEROBIC AND ANAEROBIC 5CC  Final   Culture NO GROWTH 3 DAYS  Final   Report Status PENDING  Incomplete  Culture, bal-quantitative     Status: None   Collection Time: 02/14/15  5:19 PM  Result Value Ref Range Status   Specimen Description BRONCHIAL ALVEOLAR LAVAGE  Final   Special Requests NONE  Final   Gram Stain   Final    RARE WBC PRESENT, PREDOMINANTLY PMN NO SQUAMOUS EPITHELIAL CELLS SEEN NO ORGANISMS SEEN Performed at [place]   Final    40,000 COLONIES/ML Performed at [facility]    Culture   Final    Non-Pathogenic Oropharyngeal-type Flora Isolated. Performed at [facility]    Report Status 02/17/2015 FINAL  Final  Fungus Culture with Smear     Status:  None (Preliminary result)   Collection Time: 02/14/15  5:19 PM  Result Value Ref Range Status   Specimen Description BRONCHIAL ALVEOLAR LAVAGE  Final   Special Requests NONE  Final   Fungal Smear   Final    NO YEAST OR FUNGAL ELEMENTS SEEN Performed at [facility]    Culture   Final    CULTURE IN PROGRESS FOR FOUR WEEKS Performed at [facility]    Report Status PENDING  Incomplete  Gram stain     Status: None   Collection Time: 02/14/15  5:19 PM  Result Value Ref Range Status   Specimen Description BRONCHIAL ALVEOLAR LAVAGE  Final   Special Requests NONE  Final   Gram Stain   Final    RARE WBC PRESENT,BOTH PMN AND MONONUCLEAR NO ORGANISMS SEEN    Report Status 02/14/2015 FINAL  Final  AFB culture with smear     Status: None (Preliminary result)   Collection Time: 02/14/15  5:19 PM  Result Value Ref Range Status   Specimen Description BRONCHIAL ALVEOLAR LAVAGE  Final   Special Requests NONE  Final   Acid Fast Smear   Final    NO ACID FAST BACILLI SEEN Performed at [facility]    Culture   Final    CULTURE WILL BE EXAMINED FOR 6 WEEKS BEFORE ISSUING A FINAL REPORT Performed at [facility]    Report Status PENDING  Incomplete  MRSA PCR Screening     Status: None   Collection Time: 02/14/15  8:05 PM  Result Value Ref Range Status   MRSA by PCR NEGATIVE NEGATIVE Final    Comment:        The GeneXpert MRSA Assay (FDA approved for NASAL specimens only), is one component of a comprehensive MRSA colonization surveillance program. It is not intended to diagnose MRSA infection nor to guide or monitor treatment for MRSA infections.   Culture, blood (routine x 2)     Status: None (Preliminary result)   Collection Time: 02/14/15  9:10 PM  Result Value Ref Range Status   Specimen Description BLOOD RIGHT ANTECUBITAL  Final   Special Requests IN PEDIATRIC BOTTLE 3CC  Final   Culture NO GROWTH 3 DAYS  Final   Report Status PENDING  Incomplete  AFB culture with smear     Status: None (Preliminary result)   Collection Time: 02/14/15  9:29 PM  Result Value Ref Range Status   Specimen Description BRONCHIAL ALVEOLAR LAVAGE  Final   Special Requests NONE  Final   Acid Fast Smear   Final    NO ACID FAST BACILLI SEEN Performed at [facility]    Culture   Final    CULTURE WILL BE EXAMINED FOR 6 WEEKS BEFORE ISSUING A FINAL REPORT Performed at [facility]    Report Status PENDING  Incomplete  Fungus Culture with Smear     Status: None (Preliminary result)   Collection Time: 02/14/15  9:30 PM  Result Value Ref Range Status   Specimen Description BRONCHIAL ALVEOLAR LAVAGE  Final   Special Requests NONE  Final   Fungal Smear   Final    NO YEAST OR FUNGAL ELEMENTS SEEN Performed at [facility]    Culture   Final    CULTURE IN PROGRESS FOR FOUR WEEKS Performed at [facility]    Report Status PENDING  Incomplete  Gram stain     Status: None   Collection Time: 02/14/15  9:31 PM  Result Value Ref Range Status   Specimen Description BRONCHIAL ALVEOLAR LAVAGE  Final   Special Requests NONE  Final   Gram Stain   Final    RARE WBC PRESENT, PREDOMINANTLY PMN NO ORGANISMS SEEN    Report Status 02/14/2015 FINAL  Final  Culture, respiratory (NON-Expectorated)     Status: None   Collection Time: 02/14/15  9:31 PM  Result Value Ref Range Status   Specimen Description BRONCHIAL ALVEOLAR LAVAGE  Final   Special Requests ADDED ON 02/15/15  Final   Gram Stain   Final    FEW WBC PRESENT, PREDOMINANTLY PMN RARE SQUAMOUS EPITHELIAL CELLS PRESENT RARE GRAM POSITIVE COCCI IN PAIRS Performed at [facility]    Culture   Final    Non-Pathogenic Oropharyngeal-type Flora Isolated. Performed at [facility]    Report Status 02/17/2015 FINAL  Final  AFB culture with smear     Status: None (Preliminary result)   Collection Time: 02/15/15 10:30 AM  Result Value Ref Range Status    Specimen Description BRONCHIAL ALVEOLAR LAVAGE  Final   Special Requests NONE  Final   Acid Fast Smear   Final    NO ACID FAST BACILLI SEEN Performed at [facility]    Culture   Final    CULTURE WILL BE EXAMINED FOR 6 WEEKS BEFORE ISSUING A FINAL REPORT Performed at [facility]    Report Status PENDING  Incomplete  Fungus Culture with Smear     Status: None (Preliminary result)   Collection Time: 02/15/15 10:30 AM  Result Value Ref Range Status   Specimen Description BRONCHIAL ALVEOLAR LAVAGE  Final   Special Requests NONE  Final   Fungal Smear   Final    NO YEAST OR FUNGAL ELEMENTS SEEN Performed at [facility]    Culture   Final    CULTURE IN PROGRESS FOR FOUR WEEKS Performed at [facility]    Report Status PENDING  Incomplete  Culture, bal-quantitative     Status: None   Collection Time: 02/15/15 10:55 AM  Result Value Ref Range Status   Specimen Description BRONCHIAL ALVEOLAR LAVAGE  Final   Special Requests NONE  Final   Gram Stain   Final    MODERATE  WBC PRESENT, PREDOMINANTLY PMN NO SQUAMOUS EPITHELIAL CELLS SEEN NO ORGANISMS SEEN Performed at [place] Performed at [facility]   Final   Culture   Final    NO GROWTH 2 DAYS Performed at [facility]    Report Status 02/17/2015 FINAL  Final    Coagulation Studies:  Recent Labs  02/16/15 0445 02/17/15 0445 02/18/15 0343  LABPROT 15.2 14.9 16.1*  INR 1.19 1.15 1.28    Urinalysis: No results for input(s): COLORURINE, LABSPEC, PHURINE, GLUCOSEU, HGBUR, BILIRUBINUR, KETONESUR, PROTEINUR, UROBILINOGEN, NITRITE, LEUKOCYTESUR in the last 72 hours.  Invalid input(s): APPERANCEUR    Imaging: Ct Head Wo Contrast  02/17/2015   CLINICAL DATA:  Altered mental status. The patient is not moving his left side much.  EXAM: CT HEAD WITHOUT CONTRAST  TECHNIQUE: Contiguous axial images were obtained from the base of the skull  through the vertex without intravenous contrast.  COMPARISON:  None.  FINDINGS: There is low-density in the anterior right thalamus. There is no midline shift or hydrocephalus. There is no acute hemorrhage. The bony calvarium is intact. The visualized sinuses are clear.  IMPRESSION: Low-density in the anterior right thalamus suspicious for acute infarct. Further evaluation with brain MRI is recommended.   Electronically Signed   By: Abelardo Diesel M.D.   On: 02/17/2015 19:06     Medications:   . dextrose 30 mL/hr at 02/18/15 1213   . antiseptic oral rinse  7 mL Mouth Rinse q12n4p  . antiseptic oral rinse  7 mL Mouth Rinse QID  . aspirin  325 mg Oral Daily  . atorvastatin  40 mg Oral q1800  . atropine      . chlorhexidine  15 mL Mouth Rinse BID  . insulin aspart  0-15 Units Subcutaneous 6 times per day  . pantoprazole (PROTONIX) IV  40 mg Intravenous QHS  . piperacillin-tazobactam (ZOSYN)  IV  2.25 g Intravenous 4 times per day  . potassium chloride  40 mEq Per Tube Once  . sodium chloride  3 mL Intravenous Q12H  . vancomycin  1,000 mg Intravenous Q48H   sodium chloride, sodium chloride, hydrALAZINE, rocuronium, sodium chloride  Assessment/ Plan:  1. AKI on CKD Stage IV: Likely secondary to ischemic ATN from sepsis. Cr has plateaued and now coming down  2. Hemoptysis/Acute Respiratory Failure: Patient presented with 1-2 days of hemoptysis and dyspnea requiring intubation and ventilation. CT Chest concerning for diffuse alveolar hemorrhage and possibly a PNA. Bronchoscopy performed 9/28. Both sets of BAL, fungal, and AFB cultures negative so far (9/27 and 9/28). Blood cultures negative. Resp culture 9/27 with rare GPC in pairs. Currently on Vanc and Zosyn. Management per primary team.  3. Hyperkalemia: Was hyperkalemia now hypokalemia  Replete K  4. Hypophosphatemia: Ph 7.0 from 5.7. Continue to monitor.  5. Normocytic Anemia: Hgb 9.1 from 8.6. Likely anemia of chronic disease given his  CKD plus blood loss from hemoptysis. Continue to monitor. Transfuse for Hgb < 7.0.  6. HTN: BPs [id number] systolic. Not on pressors. Patient is on Amlodipine 10 mg daily at home. Currently has Hydralazine PRN for SBP > 180.  7. Type 2 DM: Last HbA1c 8.7 on 9/16. CBGs controlled in hospital in 80s-160s mostly. He is on Glipizide 10 mg daily and Lantus 50 units QHS at home. Currently on Levemir 45 units QHS with moderate ISS. Management per primary team.      LOS: 4 Zian Delair W @TODAY @1 :01 PM

## 2015-02-18 NOTE — Progress Notes (Signed)
Pulled propofol out of pyxis to replace old bottle. Propofol orders removed before new bottle could be hung. Full bottle and primed line placed in black bin in med room.

## 2015-02-18 NOTE — Progress Notes (Signed)
ANTIBIOTIC CONSULT NOTE  Pharmacy Consult for vancomycin + zosyn Indication: rule out pneumonia  No Known Allergies  Patient Measurements: Height: 5\' 10"  (177.8 cm) Weight: 185 lb 6.5 oz (84.1 kg) IBW/kg (Calculated) : 73  Vital Signs: Temp: 98.1 F (36.7 C) (10/01 0805) Temp Source: Oral (10/01 0805) BP: 193/77 mmHg (10/01 1100) Pulse Rate: 86 (10/01 1100) Intake/Output from previous day: 09/30 0701 - 10/01 0700 In: 1087.3 [I.V.:777.3; NG/GT:110; IV Piggyback:200] Out: 2040 [Urine:2040] Intake/Output from this shift: Total I/O In: 108 [I.V.:63; NG/GT:45] Out: 350 [Urine:350]  Labs:  Recent Labs  02/16/15 0445 02/17/15 0445 02/18/15 0343  WBC 10.1 10.1 9.0  HGB 8.6* 9.1* 9.4*  PLT 115* 118* 110*  CREATININE 5.90* 5.76* 5.14*   Estimated Creatinine Clearance: 16.6 mL/min (by C-G formula based on Cr of 5.14). No results for input(s): VANCOTROUGH, VANCOPEAK, VANCORANDOM, GENTTROUGH, GENTPEAK, GENTRANDOM, TOBRATROUGH, TOBRAPEAK, TOBRARND, AMIKACINPEAK, AMIKACINTROU, AMIKACIN in the last 72 hours.   Microbiology: Recent Results (from the past 720 hour(s))  Culture, blood (routine x 2)     Status: None (Preliminary result)   Collection Time: 02/14/15  4:04 PM  Result Value Ref Range Status   Specimen Description BLOOD LEFT ARM  Final   Special Requests BOTTLES DRAWN AEROBIC AND ANAEROBIC 5CC  Final   Culture NO GROWTH 3 DAYS  Final   Report Status PENDING  Incomplete  Culture, bal-quantitative     Status: None   Collection Time: 02/14/15  5:19 PM  Result Value Ref Range Status   Specimen Description BRONCHIAL ALVEOLAR LAVAGE  Final   Special Requests NONE  Final   Gram Stain   Final    RARE WBC PRESENT, PREDOMINANTLY PMN NO SQUAMOUS EPITHELIAL CELLS SEEN NO ORGANISMS SEEN Performed at [place]   Final    40,000 COLONIES/ML Performed at [facility]    Culture   Final    Non-Pathogenic Oropharyngeal-type Flora  Isolated. Performed at [facility]    Report Status 02/17/2015 FINAL  Final  Fungus Culture with Smear     Status: None (Preliminary result)   Collection Time: 02/14/15  5:19 PM  Result Value Ref Range Status   Specimen Description BRONCHIAL ALVEOLAR LAVAGE  Final   Special Requests NONE  Final   Fungal Smear   Final    NO YEAST OR FUNGAL ELEMENTS SEEN Performed at [facility]    Culture   Final    CULTURE IN PROGRESS FOR FOUR WEEKS Performed at [facility]    Report Status PENDING  Incomplete  Gram stain     Status: None   Collection Time: 02/14/15  5:19 PM  Result Value Ref Range Status   Specimen Description BRONCHIAL ALVEOLAR LAVAGE  Final   Special Requests NONE  Final   Gram Stain   Final    RARE WBC PRESENT,BOTH PMN AND MONONUCLEAR NO ORGANISMS SEEN    Report Status 02/14/2015 FINAL  Final  AFB culture with smear     Status: None (Preliminary result)   Collection Time: 02/14/15  5:19 PM  Result Value Ref Range Status   Specimen Description BRONCHIAL ALVEOLAR LAVAGE  Final   Special Requests NONE  Final   Acid Fast Smear   Final    NO ACID FAST BACILLI SEEN Performed at [facility]    Culture   Final    CULTURE WILL BE EXAMINED FOR 6 WEEKS BEFORE ISSUING A FINAL REPORT Performed at [facility]    Report Status PENDING  Incomplete  MRSA PCR Screening     Status: None   Collection Time: 02/14/15  8:05 PM  Result Value Ref Range Status   MRSA by PCR NEGATIVE NEGATIVE Final    Comment:        The GeneXpert MRSA Assay (FDA approved for NASAL specimens only), is one component of a comprehensive MRSA colonization surveillance program. It is not intended to diagnose MRSA infection nor to guide or monitor treatment for MRSA infections.   Culture, blood (routine x 2)     Status: None (Preliminary result)   Collection Time: 02/14/15  9:10 PM  Result Value Ref Range Status   Specimen Description BLOOD RIGHT ANTECUBITAL   Final   Special Requests IN PEDIATRIC BOTTLE 3CC  Final   Culture NO GROWTH 3 DAYS  Final   Report Status PENDING  Incomplete  AFB culture with smear     Status: None (Preliminary result)   Collection Time: 02/14/15  9:29 PM  Result Value Ref Range Status   Specimen Description BRONCHIAL ALVEOLAR LAVAGE  Final   Special Requests NONE  Final   Acid Fast Smear   Final    NO ACID FAST BACILLI SEEN Performed at [facility]    Culture   Final    CULTURE WILL BE EXAMINED FOR 6 WEEKS BEFORE ISSUING A FINAL REPORT Performed at [facility]    Report Status PENDING  Incomplete  Fungus Culture with Smear     Status: None (Preliminary result)   Collection Time: 02/14/15  9:30 PM  Result Value Ref Range Status   Specimen Description BRONCHIAL ALVEOLAR LAVAGE  Final   Special Requests NONE  Final   Fungal Smear   Final    NO YEAST OR FUNGAL ELEMENTS SEEN Performed at [facility]    Culture   Final    CULTURE IN PROGRESS FOR FOUR WEEKS Performed at [facility]    Report Status PENDING  Incomplete  Gram stain     Status: None   Collection Time: 02/14/15  9:31 PM  Result Value Ref Range Status   Specimen Description BRONCHIAL ALVEOLAR LAVAGE  Final   Special Requests NONE  Final   Gram Stain   Final    RARE WBC PRESENT, PREDOMINANTLY PMN NO ORGANISMS SEEN    Report Status 02/14/2015 FINAL  Final  Culture, respiratory (NON-Expectorated)     Status: None   Collection Time: 02/14/15  9:31 PM  Result Value Ref Range Status   Specimen Description BRONCHIAL ALVEOLAR LAVAGE  Final   Special Requests ADDED ON 02/15/15  Final   Gram Stain   Final    FEW WBC PRESENT, PREDOMINANTLY PMN RARE SQUAMOUS EPITHELIAL CELLS PRESENT RARE GRAM POSITIVE COCCI IN PAIRS Performed at [facility]    Culture   Final    Non-Pathogenic Oropharyngeal-type Flora Isolated. Performed at [facility]    Report Status 02/17/2015 FINAL  Final  AFB culture  with smear     Status: None (Preliminary result)   Collection Time: 02/15/15 10:30 AM  Result Value Ref Range Status   Specimen Description BRONCHIAL ALVEOLAR LAVAGE  Final   Special Requests NONE  Final   Acid Fast Smear   Final    NO ACID FAST BACILLI SEEN Performed at [facility]    Culture   Final    CULTURE WILL BE EXAMINED FOR 6 WEEKS BEFORE ISSUING A FINAL REPORT Performed  at [facility]    Report Status PENDING  Incomplete  Fungus Culture with Smear     Status: None (Preliminary result)   Collection Time: 02/15/15 10:30 AM  Result Value Ref Range Status   Specimen Description BRONCHIAL ALVEOLAR LAVAGE  Final   Special Requests NONE  Final   Fungal Smear   Final    NO YEAST OR FUNGAL ELEMENTS SEEN Performed at [facility]    Culture   Final    CULTURE IN PROGRESS FOR FOUR WEEKS Performed at [facility]    Report Status PENDING  Incomplete  Culture, bal-quantitative     Status: None   Collection Time: 02/15/15 10:55 AM  Result Value Ref Range Status   Specimen Description BRONCHIAL ALVEOLAR LAVAGE  Final   Special Requests NONE  Final   Gram Stain   Final    MODERATE WBC PRESENT, PREDOMINANTLY PMN NO SQUAMOUS EPITHELIAL CELLS SEEN NO ORGANISMS SEEN Performed at [place] Performed at [facility]   Final   Culture   Final    NO GROWTH 2 DAYS Performed at [facility]    Report Status 02/17/2015 FINAL  Final    Medical History: Past Medical History  Diagnosis Date  . Diabetes mellitus without complication     followed by Dr Chalmers Cater  . Hypertension     followed by [facility]    Medications:  Anti-infectives    Start     Dose/Rate Route Frequency Ordered Stop   02/16/15 2000  vancomycin (VANCOCIN) IVPB 1000 mg/200 mL premix     1,000 mg 200 mL/hr over 60 Minutes Intravenous Every 48 hours 02/14/15 1802     02/15/15 0000  piperacillin-tazobactam  (ZOSYN) IVPB 2.25 g     2.25 g 100 mL/hr over 30 Minutes Intravenous 4 times per day 02/14/15 1802     02/14/15 1800  vancomycin (VANCOCIN) 1,500 mg in sodium chloride 0.9 % 500 mL IVPB     1,500 mg 250 mL/hr over 120 Minutes Intravenous  Once 02/14/15 1759 02/16/15 1900   02/14/15 1800  piperacillin-tazobactam (ZOSYN) IVPB 3.375 g  Status:  Discontinued     3.375 g 100 mL/hr over 30 Minutes Intravenous  Once 02/14/15 1759 02/16/15 1157     Assessment: 37 yom presented to the ED with SOB and hemoptysis requiring intubation>>now extubated. Continuing on empiric broad-spectrum antibiotics for possible pneumonia vs. DAH. Pt is afebrile, wbc wnl. Scr 4.5 on admit, up to 5.76, now decreased today to 5.14 with a known history of CKD. Lactic acid is elevated at 5.13.   Vanc 9/27>> Zosyn 9/27>>  9/28 BAL>>neg 9/27 BAL>> npof 9/27 BCx2>> ngtd  Goal of Therapy:  Vancomycin trough level 15-20 mcg/ml  Plan:  Zosyn 2.25gm IV Q6H  Vancomycin 1gm IV Q48H  Likely should get a level prior to 3rd dose due to SCr increase  F/u renal fxn, C&S, clinical status  VT 1930 on 10/1   Elicia Lamp, PharmD Clinical Pharmacist Pager [phone] 02/18/2015 12:39 PM

## 2015-02-19 ENCOUNTER — Encounter (HOSPITAL_COMMUNITY): Payer: BLUE CROSS/BLUE SHIELD

## 2015-02-19 ENCOUNTER — Encounter (HOSPITAL_COMMUNITY): Payer: Self-pay | Admitting: *Deleted

## 2015-02-19 LAB — RENAL FUNCTION PANEL
ANION GAP: 10 (ref 5–15)
Albumin: 2.2 g/dL — ABNORMAL LOW (ref 3.5–5.0)
BUN: 67 mg/dL — ABNORMAL HIGH (ref 6–20)
CHLORIDE: 108 mmol/L (ref 101–111)
CO2: 27 mmol/L (ref 22–32)
Calcium: 7.5 mg/dL — ABNORMAL LOW (ref 8.9–10.3)
Creatinine, Ser: 4.27 mg/dL — ABNORMAL HIGH (ref 0.61–1.24)
GFR calc Af Amer: 16 mL/min — ABNORMAL LOW (ref >60)
GFR calc non Af Amer: 14 mL/min — ABNORMAL LOW (ref >60)
GLUCOSE: 143 mg/dL — AB (ref 65–99)
POTASSIUM: 3.1 mmol/L — AB (ref 3.5–5.1)
Phosphorus: 4.6 mg/dL (ref 2.5–4.6)
Sodium: 145 mmol/L (ref 135–145)

## 2015-02-19 LAB — PROTIME-INR
INR: 1.18 (ref 0.00–1.49)
PROTHROMBIN TIME: 15.2 s (ref 11.6–15.2)

## 2015-02-19 LAB — CBC WITH DIFFERENTIAL/PLATELET
BASOS ABS: 0 10*3/uL (ref 0.0–0.1)
BASOS PCT: 0 %
EOS PCT: 1 %
Eosinophils Absolute: 0 10*3/uL (ref 0.0–0.7)
HCT: 28.4 % — ABNORMAL LOW (ref 39.0–52.0)
Hemoglobin: 9.4 g/dL — ABNORMAL LOW (ref 13.0–17.0)
Lymphocytes Relative: 15 %
Lymphs Abs: 0.9 10*3/uL (ref 0.7–4.0)
MCH: 28.1 pg (ref 26.0–34.0)
MCHC: 33.1 g/dL (ref 30.0–36.0)
MCV: 85 fL (ref 78.0–100.0)
MONO ABS: 0.5 10*3/uL (ref 0.1–1.0)
Monocytes Relative: 7 %
Neutro Abs: 4.8 10*3/uL (ref 1.7–7.7)
Neutrophils Relative %: 77 %
PLATELETS: 119 10*3/uL — AB (ref 150–400)
RBC: 3.34 MIL/uL — ABNORMAL LOW (ref 4.22–5.81)
RDW: 12.9 % (ref 11.5–15.5)
WBC: 6.3 10*3/uL (ref 4.0–10.5)

## 2015-02-19 LAB — GLUCOSE, CAPILLARY
GLUCOSE-CAPILLARY: 133 mg/dL — AB (ref 65–99)
GLUCOSE-CAPILLARY: 124 mg/dL — AB (ref 65–99)
GLUCOSE-CAPILLARY: 165 mg/dL — AB (ref 65–99)
GLUCOSE-CAPILLARY: 314 mg/dL — AB (ref 65–99)
GLUCOSE-CAPILLARY: 240 mg/dL — AB (ref 65–99)
GLUCOSE-CAPILLARY: 226 mg/dL — AB (ref 65–99)
Glucose-Capillary: 239 mg/dL — ABNORMAL HIGH (ref 65–99)
Glucose-Capillary: 286 mg/dL — ABNORMAL HIGH (ref 65–99)

## 2015-02-19 LAB — CULTURE, BLOOD (ROUTINE X 2)
Culture: NO GROWTH
Culture: NO GROWTH

## 2015-02-19 LAB — MAGNESIUM: Magnesium: 2.3 mg/dL (ref 1.7–2.4)

## 2015-02-19 LAB — BRAIN NATRIURETIC PEPTIDE: B Natriuretic Peptide: 1476 pg/mL — ABNORMAL HIGH (ref 0.0–100.0)

## 2015-02-19 LAB — APTT: APTT: 25 s (ref 24–37)

## 2015-02-19 MED ORDER — POTASSIUM CHLORIDE 10 MEQ/50ML IV SOLN
10.0000 meq | INTRAVENOUS | Status: AC
  Administered 2015-02-19 (×2): 10 meq via INTRAVENOUS
  Filled 2015-02-19 (×2): qty 50

## 2015-02-19 MED ORDER — INSULIN ASPART 100 UNIT/ML ~~LOC~~ SOLN
0.0000 [IU] | Freq: Three times a day (TID) | SUBCUTANEOUS | Status: DC
  Administered 2015-02-19: 11 [IU] via SUBCUTANEOUS
  Administered 2015-02-19: 5 [IU] via SUBCUTANEOUS
  Administered 2015-02-19: 8 [IU] via SUBCUTANEOUS
  Administered 2015-02-20 (×2): 11 [IU] via SUBCUTANEOUS
  Administered 2015-02-20: 5 [IU] via SUBCUTANEOUS
  Administered 2015-02-20 – 2015-02-21 (×3): 3 [IU] via SUBCUTANEOUS
  Administered 2015-02-21 – 2015-02-22 (×2): 8 [IU] via SUBCUTANEOUS
  Administered 2015-02-22: 5 [IU] via SUBCUTANEOUS
  Administered 2015-02-22: 8 [IU] via SUBCUTANEOUS

## 2015-02-19 NOTE — Evaluation (Signed)
Physical Therapy Evaluation Patient Details Name: Russell Price MRN: [id number] DOB: 10-04-1960 Today's Date: 02/19/2015   History of Present Illness  Pt is a 54 y.o. male (Arabic is primary language). Admitted 9/27 with hemoptysis, later developing dyspnea and tachycardia and was intubated with copious bloody secretions. Recently also had multiple admissions for dizziness. Head CT on 9/30 was suspicious for acute infarct of anterior R thalamus, MRI was motion degraded but also suspicious for acute/ subacute right thalamic infarct. Initial CXR showed bilateral perihilar infiltrates; CXR 10/1 showed decreased bilateral airspace disease with no definite pleural effusion noted. Pt extubated 10/1.   Clinical Impression  Pt admitted with above diagnosis. Pt currently with functional limitations due to the deficits listed below (see PT Problem List). Pt with decreased strength left side as well as mild inattention. Demonstrated gains within evaluation, optimistic about functional gains. Pt hopeful to return to work. Recommend CIR consult.  Pt will benefit from skilled PT to increase their independence and safety with mobility to allow discharge to the venue listed below.       Follow Up Recommendations CIR    Equipment Recommendations  Other (comment) (TBD)    Recommendations for Other Services Rehab consult;OT consult     Precautions / Restrictions Precautions Precautions: Fall Restrictions Weight Bearing Restrictions: No      Mobility  Bed Mobility Overal bed mobility: Needs Assistance Bed Mobility: Supine to Sit     Supine to sit: Mod assist     General bed mobility comments: mod A to roll to left and elevate trunk to sitting, mod A to slide hips to EOB, off balance with initial sitting but improved with time  Transfers Overall transfer level: Needs assistance Equipment used: Rolling walker (2 wheeled) Transfers: Sit to/from Stand Sit to Stand: Mod assist         General  transfer comment: mod A to steady, vc's for hand placement, assist needed to properly place left foot before attempting stand, left knee with partial buckling initially  Ambulation/Gait Ambulation/Gait assistance: Mod assist Ambulation Distance (Feet): 30 Feet Assistive device: Rolling walker (2 wheeled) Gait Pattern/deviations: Step-to pattern;Decreased weight shift to left;Decreased dorsiflexion - left;Decreased stance time - left;Decreased step length - left;Trunk flexed Gait velocity: very slow Gait velocity interpretation: <1.8 ft/sec, indicative of risk for recurrent falls General Gait Details: dragging left toes, can lift foot when cued, occasional buckling of left knee. vc's for stay close to RW and cued each time for stepping of LLE.   Stairs            Wheelchair Mobility    Modified Rankin (Stroke Patients Only) Modified Rankin (Stroke Patients Only) Pre-Morbid Rankin Score: No symptoms Modified Rankin: Moderately severe disability     Balance Overall balance assessment: Needs assistance Sitting-balance support: Single extremity supported;Feet supported Sitting balance-Leahy Scale: Poor Sitting balance - Comments: right lean, worked on increasing attention to left sied in sitting, specifically UE.  Postural control: Right lateral lean Standing balance support: Bilateral upper extremity supported Standing balance-Leahy Scale: Fair Standing balance comment: unable to stand without UE support, right lean in standing                             Pertinent Vitals/Pain Pain Assessment: No/denies pain    Home Living Family/patient expects to be discharged to:: Private residence Living Arrangements: Spouse/significant other;Other relatives Available Help at Discharge: Family;Available PRN/intermittently Type of Home: Apartment Home Access: Level entry  Home Layout: One level Home Equipment: None      Prior Function Level of Independence:  Independent         Comments: pt works in [employer], very physical job per his report     Hand Dominance   Dominant Hand: Right    Extremity/Trunk Assessment   Upper Extremity Assessment: Defer to OT evaluation;LUE deficits/detail       LUE Deficits / Details: decreased grip strength and fine/ gross motor control of LUE, though pt can flex shoulder through full range and sufficient grip to hold RW   Lower Extremity Assessment: LLE deficits/detail   LLE Deficits / Details: hip flex 3-/5, knee ext 3-/5, knee flex 3/5, ankle df 3/5, ankle pf 3+/5  Cervical / Trunk Assessment: Normal  Communication   Communication: Prefers language other than English (Arabic, but speaks [place])  Cognition Arousal/Alertness: Awake/alert Behavior During Therapy: WFL for tasks assessed/performed Overall Cognitive Status: Impaired/Different from baseline Area of Impairment: Problem solving;Safety/judgement         Safety/Judgement: Decreased awareness of deficits   Problem Solving: Slow processing;Requires verbal cues;Requires tactile cues General Comments: decreased attention to left side, decreased awareness of deficits at this point    General Comments General comments (skin integrity, edema, etc.): VSS    Exercises General Exercises - Lower Extremity Ankle Circles/Pumps: AROM;Both;10 reps;Seated Long Arc Quad: AROM;Left;10 reps;Seated Hip Flexion/Marching: AROM;Left;10 reps;Seated Toe Raises: AROM;Left;10 reps;Seated Heel Raises: AROM;Left;10 reps;Seated      Assessment/Plan    PT Assessment Patient needs continued PT services  PT Diagnosis Difficulty walking;Abnormality of gait;Hemiplegia non-dominant side   PT Problem List Decreased strength;Decreased activity tolerance;Decreased range of motion;Decreased balance;Decreased mobility;Decreased coordination;Decreased cognition;Decreased knowledge of use of DME;Decreased safety awareness;Decreased knowledge of  precautions;Impaired tone;Impaired sensation  PT Treatment Interventions DME instruction;Stair training;Gait training;Functional mobility training;Therapeutic activities;Therapeutic exercise;Balance training;Neuromuscular re-education;Cognitive remediation;Patient/family education   PT Goals (Current goals can be found in the Care Plan section) Acute Rehab PT Goals Patient Stated Goal: return to home and work PT Goal Formulation: With patient/family Time For Goal Achievement: 03/05/15 Potential to Achieve Goals: Good    Frequency Min 4X/week   Barriers to discharge        Co-evaluation               End of Session Equipment Utilized During Treatment: Gait belt Activity Tolerance: Patient tolerated treatment well Patient left: in chair;with call bell/phone within reach;with family/visitor present Nurse Communication: Mobility status         Time: 0828-0906 PT Time Calculation (min) (ACUTE ONLY): 38 min   Charges:   PT Evaluation $Initial PT Evaluation Tier I: 1 Procedure PT Treatments $Gait Training: 8-22 mins $Therapeutic Activity: 8-22 mins   PT G Codes:      Leighton Roach, PT  Acute Rehab Services  [place], [place] 02/19/2015, 9:29 AM

## 2015-02-19 NOTE — Progress Notes (Signed)
[facility] ROUNDING NOTE   Subjective:   Interval History:  Sitting out bed  Good diuresis  Objective:  Vital signs in last 24 hours:  Temp:  [98.5 F (36.9 C)-99.6 F (37.6 C)] 98.6 F (37 C) (10/02 0829) Pulse Rate:  [47-86] 53 (10/02 0700) Resp:  [14-25] 16 (10/02 0700) BP: (159-193)/(59-81) 160/66 mmHg (10/02 0700) SpO2:  [95 %-100 %] 99 % (10/02 0700) Weight:  [82.8 kg (182 lb 8.7 oz)] 82.8 kg (182 lb 8.7 oz) (10/02 0400)  Weight change: -1.3 kg (-2 lb 13.9 oz) Filed Weights   02/17/15 0500 02/18/15 0330 02/19/15 0400  Weight: 85.7 kg (188 lb 15 oz) 84.1 kg (185 lb 6.5 oz) 82.8 kg (182 lb 8.7 oz)    Intake/Output: I/O last 3 completed shifts: In: 2879.1 [P.O.:1200; I.V.:1044.1; Other:20; NG/GT:65; IV Piggyback:550] Out: 3690 [Urine:3690]   Intake/Output this shift:     CVS- RRR RS- CTA ABD- BS present soft non-distended EXT- no edema Gen: sedated on vent CVS: bradycardic, no m/g/r Resp: coarse breath sounds bilaterally Abd: BS+, soft, non-distended Ext: warm,  No edema    Basic Metabolic Panel:  Recent Labs Lab 02/15/15 0450 02/15/15 1215 02/16/15 0445 02/17/15 0445 02/18/15 0343 02/19/15 0422  NA 138 140 140 145 146* 145  K 5.3* 5.3* 4.0 3.7 3.3* 3.1*  CL 107 108 104 106 107 108  CO2 21* 25 24 26 27 27   GLUCOSE 134* 144* 219* 144* 116* 143*  BUN 53* 61* 77* 84* 91* 67*  CREATININE 5.37* 5.47* 5.90* 5.76* 5.14* 4.27*  CALCIUM 8.3* 8.1* 8.0* 7.7* 7.5* 7.5*  MG 2.0  --  2.1 2.3 2.5* 2.3  PHOS 1.5*  --  5.7* 7.0* 5.8* 4.6    Liver Function Tests:  Recent Labs Lab 02/14/15 1604 02/16/15 0445 02/17/15 0445 02/18/15 0343 02/19/15 0422  AST 36  --   --   --   --   ALT 19  --   --   --   --   ALKPHOS 92  --   --   --   --   BILITOT 1.0  --   --   --   --   PROT 6.6  --   --   --   --   ALBUMIN 3.1* 2.1* 2.3* 2.3* 2.2*   No results for input(s): LIPASE, AMYLASE in the last 168 hours. No results for input(s): AMMONIA in the  last 168 hours.  CBC:  Recent Labs Lab 02/14/15 1604  02/15/15 0450 02/16/15 0445 02/17/15 0445 02/18/15 0343 02/19/15 0422  WBC 20.9*  < > 16.4* 10.1 10.1 9.0 6.3  NEUTROABS 19.5*  --   --  9.5* 9.6* 7.8* 4.8  HGB 11.5*  < > 10.6* 8.6* 9.1* 9.4* 9.4*  HCT 33.7*  < > 30.4* 24.9* 25.9* 27.0* 28.4*  MCV 83.4  < > 84.0 83.6 83.5 84.1 85.0  PLT 208  < > 142* 115* 118* 110* 119*  < > = values in this interval not displayed.  Cardiac Enzymes:  Recent Labs Lab 02/14/15 2118 02/14/15 2300 02/15/15 0520 02/16/15 0445  TROPONINI 2.99* 5.50* 11.78* 8.20*    BNP: Invalid input(s): POCBNP  CBG:  Recent Labs Lab 02/18/15 2019 02/18/15 2359 02/19/15 0351 02/19/15 0645 02/19/15 0828  GLUCAP 164* 239* 133* 124* 165*    Microbiology: Results for orders placed or performed during the hospital encounter of 02/14/15  Culture, blood (routine x 2)     Status: None (Preliminary result)  Collection Time: 02/14/15  4:04 PM  Result Value Ref Range Status   Specimen Description BLOOD LEFT ARM  Final   Special Requests BOTTLES DRAWN AEROBIC AND ANAEROBIC 5CC  Final   Culture NO GROWTH 4 DAYS  Final   Report Status PENDING  Incomplete  Culture, bal-quantitative     Status: None   Collection Time: 02/14/15  5:19 PM  Result Value Ref Range Status   Specimen Description BRONCHIAL ALVEOLAR LAVAGE  Final   Special Requests NONE  Final   Gram Stain   Final    RARE WBC PRESENT, PREDOMINANTLY PMN NO SQUAMOUS EPITHELIAL CELLS SEEN NO ORGANISMS SEEN Performed at [facility]   Final    40,000 COLONIES/ML Performed at [facility]    Culture   Final    Non-Pathogenic Oropharyngeal-type Flora Isolated. Performed at [facility]    Report Status 02/17/2015 FINAL  Final  Fungus Culture with Smear     Status: None (Preliminary result)   Collection Time: 02/14/15  5:19 PM  Result Value Ref Range Status   Specimen Description BRONCHIAL ALVEOLAR  LAVAGE  Final   Special Requests NONE  Final   Fungal Smear   Final    NO YEAST OR FUNGAL ELEMENTS SEEN Performed at [facility]    Culture   Final    CULTURE IN PROGRESS FOR FOUR WEEKS Performed at [facility]    Report Status PENDING  Incomplete  Gram stain     Status: None   Collection Time: 02/14/15  5:19 PM  Result Value Ref Range Status   Specimen Description BRONCHIAL ALVEOLAR LAVAGE  Final   Special Requests NONE  Final   Gram Stain   Final    RARE WBC PRESENT,BOTH PMN AND MONONUCLEAR NO ORGANISMS SEEN    Report Status 02/14/2015 FINAL  Final  AFB culture with smear     Status: None (Preliminary result)   Collection Time: 02/14/15  5:19 PM  Result Value Ref Range Status   Specimen Description BRONCHIAL ALVEOLAR LAVAGE  Final   Special Requests NONE  Final   Acid Fast Smear   Final    NO ACID FAST BACILLI SEEN Performed at [facility]    Culture   Final    CULTURE WILL BE EXAMINED FOR 6 WEEKS BEFORE ISSUING A FINAL REPORT Performed at [facility]    Report Status PENDING  Incomplete  MRSA PCR Screening     Status: None   Collection Time: 02/14/15  8:05 PM  Result Value Ref Range Status   MRSA by PCR NEGATIVE NEGATIVE Final    Comment:        The GeneXpert MRSA Assay (FDA approved for NASAL specimens only), is one component of a comprehensive MRSA colonization surveillance program. It is not intended to diagnose MRSA infection nor to guide or monitor treatment for MRSA infections.   Culture, blood (routine x 2)     Status: None (Preliminary result)   Collection Time: 02/14/15  9:10 PM  Result Value Ref Range Status   Specimen Description BLOOD RIGHT ANTECUBITAL  Final   Special Requests IN PEDIATRIC BOTTLE 3CC  Final   Culture NO GROWTH 4 DAYS  Final   Report Status PENDING  Incomplete  AFB culture with smear     Status: None (Preliminary result)   Collection Time: 02/14/15  9:29 PM  Result Value Ref Range Status    Specimen Description BRONCHIAL ALVEOLAR  LAVAGE  Final   Special Requests NONE  Final   Acid Fast Smear   Final    NO ACID FAST BACILLI SEEN Performed at [facility]    Culture   Final    CULTURE WILL BE EXAMINED FOR 6 WEEKS BEFORE ISSUING A FINAL REPORT Performed at [facility]    Report Status PENDING  Incomplete  Fungus Culture with Smear     Status: None (Preliminary result)   Collection Time: 02/14/15  9:30 PM  Result Value Ref Range Status   Specimen Description BRONCHIAL ALVEOLAR LAVAGE  Final   Special Requests NONE  Final   Fungal Smear   Final    NO YEAST OR FUNGAL ELEMENTS SEEN Performed at [facility]    Culture   Final    CULTURE IN PROGRESS FOR FOUR WEEKS Performed at [facility]    Report Status PENDING  Incomplete  Gram stain     Status: None   Collection Time: 02/14/15  9:31 PM  Result Value Ref Range Status   Specimen Description BRONCHIAL ALVEOLAR LAVAGE  Final   Special Requests NONE  Final   Gram Stain   Final    RARE WBC PRESENT, PREDOMINANTLY PMN NO ORGANISMS SEEN    Report Status 02/14/2015 FINAL  Final  Culture, respiratory (NON-Expectorated)     Status: None   Collection Time: 02/14/15  9:31 PM  Result Value Ref Range Status   Specimen Description BRONCHIAL ALVEOLAR LAVAGE  Final   Special Requests ADDED ON 02/15/15  Final   Gram Stain   Final    FEW WBC PRESENT, PREDOMINANTLY PMN RARE SQUAMOUS EPITHELIAL CELLS PRESENT RARE GRAM POSITIVE COCCI IN PAIRS Performed at [facility]    Culture   Final    Non-Pathogenic Oropharyngeal-type Flora Isolated. Performed at [facility]    Report Status 02/17/2015 FINAL  Final  AFB culture with smear     Status: None (Preliminary result)   Collection Time: 02/15/15 10:30 AM  Result Value Ref Range Status   Specimen Description BRONCHIAL ALVEOLAR LAVAGE  Final   Special Requests NONE  Final   Acid Fast Smear   Final    NO ACID FAST BACILLI  SEEN Performed at [facility]    Culture   Final    CULTURE WILL BE EXAMINED FOR 6 WEEKS BEFORE ISSUING A FINAL REPORT Performed at [facility]    Report Status PENDING  Incomplete  Fungus Culture with Smear     Status: None (Preliminary result)   Collection Time: 02/15/15 10:30 AM  Result Value Ref Range Status   Specimen Description BRONCHIAL ALVEOLAR LAVAGE  Final   Special Requests NONE  Final   Fungal Smear   Final    NO YEAST OR FUNGAL ELEMENTS SEEN Performed at [facility]    Culture   Final    CULTURE IN PROGRESS FOR FOUR WEEKS Performed at [facility]    Report Status PENDING  Incomplete  Culture, bal-quantitative     Status: None   Collection Time: 02/15/15 10:55 AM  Result Value Ref Range Status   Specimen Description BRONCHIAL ALVEOLAR LAVAGE  Final   Special Requests NONE  Final   Gram Stain   Final    MODERATE WBC PRESENT, PREDOMINANTLY PMN NO SQUAMOUS EPITHELIAL CELLS SEEN NO ORGANISMS SEEN Performed at [facility] Performed at [facility]   Final  Culture   Final    NO GROWTH 2 DAYS Performed at [facility]    Report Status 02/17/2015 FINAL  Final  Respiratory virus panel     Status: None   Collection Time: 02/17/15  2:00 PM  Result Value Ref Range Status   Respiratory Syncytial Virus A Negative Negative Final   Respiratory Syncytial Virus B Negative Negative Final   Influenza A Negative Negative Final   Influenza B Negative Negative Final   Parainfluenza 1 Negative Negative Final   Parainfluenza 2 Negative Negative Final   Parainfluenza 3 Negative Negative Final   Metapneumovirus Negative Negative Final   Rhinovirus Negative Negative Final   Adenovirus Negative Negative Final    Comment: (NOTE) Performed At: [facility] [address] [place], [place] [id number] Lindon Romp MD [id number]     Coagulation Studies:  Recent Labs   02/17/15 0445 02/18/15 0343 02/19/15 0422  LABPROT 14.9 16.1* 15.2  INR 1.15 1.28 1.18    Urinalysis: No results for input(s): COLORURINE, LABSPEC, PHURINE, GLUCOSEU, HGBUR, BILIRUBINUR, KETONESUR, PROTEINUR, UROBILINOGEN, NITRITE, LEUKOCYTESUR in the last 72 hours.  Invalid input(s): APPERANCEUR    Imaging: Ct Head Wo Contrast  02/17/2015   CLINICAL DATA:  Altered mental status. The patient is not moving his left side much.  EXAM: CT HEAD WITHOUT CONTRAST  TECHNIQUE: Contiguous axial images were obtained from the base of the skull through the vertex without intravenous contrast.  COMPARISON:  None.  FINDINGS: There is low-density in the anterior right thalamus. There is no midline shift or hydrocephalus. There is no acute hemorrhage. The bony calvarium is intact. The visualized sinuses are clear.  IMPRESSION: Low-density in the anterior right thalamus suspicious for acute infarct. Further evaluation with brain MRI is recommended.   Electronically Signed   By: Abelardo Diesel M.D.   On: 02/17/2015 19:06   Mr Virgel Paling Wo Contrast  02/18/2015   CLINICAL DATA:  54 year old diabetic hypertensive male with dizziness and altered mental status. Subsequent encounter.  EXAM: MRI HEAD WITHOUT CONTRAST  MRA HEAD WITHOUT CONTRAST  TECHNIQUE: Multiplanar, multiecho pulse sequences of the brain and surrounding structures were obtained without intravenous contrast. Angiographic images of the head were obtained using MRA technique without contrast.  COMPARISON:  02/17/2015 head CT.  No comparison brain MR.  FINDINGS: MRI HEAD FINDINGS  Exam is motion degraded.  Ill-defined region of altered signal intensity right peri third ventricular region/ anterior lateral margin of the right thalamus may represent an acute/subacute infarct although given the indistinct appearance, follow-up imaging will be necessary to exclude the possibility of this representing result of infection or mass. This causes slight impression upon  the right lateral aspect of the third ventricle.  Focal region of blood breakdown products superior vermis. Question small cavernoma as versus result of hemorrhage from prior infarct or trauma.  Tiny nonspecific tiny cerebral spinal fluid appearing structures adjacent to the left periatrial region may represent result of prior insult, small perivascular spaces or tiny neural glial cyst.  Partial opacification mastoid air cells without obstructing lesion of the eustachian tube noted.  No hydrocephalus.  Prominent mineral deposition globus pallidus incidentally noted.  MRA HEAD FINDINGS  Exam is motion degraded.  Mild to moderate narrowing cavernous segment right internal carotid artery.  Mild narrowing supraclinoid segment internal carotid artery bilaterally.  Focal stenosis of left M2 segment branch.  Left vertebral artery ends in a left posterior inferior cerebellar artery distribution. The left posterior inferior cerebellar artery is narrowed  and irregular.  No high-grade stenosis of the distal right vertebral artery.  Narrowed right posterior inferior cerebellar artery.  Mild narrowing mid aspect of the basilar artery.  Mild narrowing of portions of the anterior inferior cerebellar artery bilaterally.  Moderate narrowing right superior cerebellar artery.  Mild to moderate focal narrowing proximal left posterior cerebral artery.  No aneurysm noted.  IMPRESSION: MRI HEAD  Exam is motion degraded.  Ill-defined region of altered signal intensity right peri third ventricular region/ anterior lateral margin of the right thalamus may represent an acute/subacute infarct although given the indistinct appearance, follow-up imaging will be necessary to exclude the possibility of this representing result of infection or mass. This causes slight impression upon the right lateral aspect of the third ventricle.  Focal region of blood breakdown products superior vermis. Question small cavernoma as versus result of hemorrhage  from prior infarct or trauma.  MRA HEAD  Exam is motion degraded.  Mild to moderate narrowing cavernous segment right internal carotid artery.  Mild narrowing supraclinoid segment internal carotid artery bilaterally.  Focal stenosis of left M2 segment branch.  Left vertebral artery ends in a left posterior inferior cerebellar artery distribution. The left posterior inferior cerebellar artery is narrowed and irregular.  No high-grade stenosis of the distal right vertebral artery.  Narrowed right posterior inferior cerebellar artery.  Mild narrowing mid aspect of the basilar artery.  Mild narrowing of portions of the anterior inferior cerebellar artery bilaterally.  Moderate narrowing right superior cerebellar artery.  Mild to moderate focal narrowing proximal left posterior cerebral artery.   Electronically Signed   By: Genia Del M.D.   On: 02/18/2015 17:59   Mr Brain Wo Contrast  02/18/2015   CLINICAL DATA:  54 year old diabetic hypertensive male with dizziness and altered mental status. Subsequent encounter.  EXAM: MRI HEAD WITHOUT CONTRAST  MRA HEAD WITHOUT CONTRAST  TECHNIQUE: Multiplanar, multiecho pulse sequences of the brain and surrounding structures were obtained without intravenous contrast. Angiographic images of the head were obtained using MRA technique without contrast.  COMPARISON:  02/17/2015 head CT.  No comparison brain MR.  FINDINGS: MRI HEAD FINDINGS  Exam is motion degraded.  Ill-defined region of altered signal intensity right peri third ventricular region/ anterior lateral margin of the right thalamus may represent an acute/subacute infarct although given the indistinct appearance, follow-up imaging will be necessary to exclude the possibility of this representing result of infection or mass. This causes slight impression upon the right lateral aspect of the third ventricle.  Focal region of blood breakdown products superior vermis. Question small cavernoma as versus result of hemorrhage  from prior infarct or trauma.  Tiny nonspecific tiny cerebral spinal fluid appearing structures adjacent to the left periatrial region may represent result of prior insult, small perivascular spaces or tiny neural glial cyst.  Partial opacification mastoid air cells without obstructing lesion of the eustachian tube noted.  No hydrocephalus.  Prominent mineral deposition globus pallidus incidentally noted.  MRA HEAD FINDINGS  Exam is motion degraded.  Mild to moderate narrowing cavernous segment right internal carotid artery.  Mild narrowing supraclinoid segment internal carotid artery bilaterally.  Focal stenosis of left M2 segment branch.  Left vertebral artery ends in a left posterior inferior cerebellar artery distribution. The left posterior inferior cerebellar artery is narrowed and irregular.  No high-grade stenosis of the distal right vertebral artery.  Narrowed right posterior inferior cerebellar artery.  Mild narrowing mid aspect of the basilar artery.  Mild narrowing of portions of the anterior  inferior cerebellar artery bilaterally.  Moderate narrowing right superior cerebellar artery.  Mild to moderate focal narrowing proximal left posterior cerebral artery.  No aneurysm noted.  IMPRESSION: MRI HEAD  Exam is motion degraded.  Ill-defined region of altered signal intensity right peri third ventricular region/ anterior lateral margin of the right thalamus may represent an acute/subacute infarct although given the indistinct appearance, follow-up imaging will be necessary to exclude the possibility of this representing result of infection or mass. This causes slight impression upon the right lateral aspect of the third ventricle.  Focal region of blood breakdown products superior vermis. Question small cavernoma as versus result of hemorrhage from prior infarct or trauma.  MRA HEAD  Exam is motion degraded.  Mild to moderate narrowing cavernous segment right internal carotid artery.  Mild narrowing  supraclinoid segment internal carotid artery bilaterally.  Focal stenosis of left M2 segment branch.  Left vertebral artery ends in a left posterior inferior cerebellar artery distribution. The left posterior inferior cerebellar artery is narrowed and irregular.  No high-grade stenosis of the distal right vertebral artery.  Narrowed right posterior inferior cerebellar artery.  Mild narrowing mid aspect of the basilar artery.  Mild narrowing of portions of the anterior inferior cerebellar artery bilaterally.  Moderate narrowing right superior cerebellar artery.  Mild to moderate focal narrowing proximal left posterior cerebral artery.   Electronically Signed   By: Genia Del M.D.   On: 02/18/2015 17:59   Dg Chest Port 1 View  02/18/2015   CLINICAL DATA:  Followup edema/infection.  EXAM: PORTABLE CHEST 1 VIEW  COMPARISON:  02/15/2015  FINDINGS: Cardiomegaly again noted.  A right IJ central venous catheter is unchanged with tip overlying the mid - upper SVC.  Bilateral airspace opacities have improved.  No definite pleural effusion or pneumothorax noted.  IMPRESSION: Decreased bilateral airspace disease.  Cardiomegaly.   Electronically Signed   By: Margarette Canada M.D.   On: 02/18/2015 13:33     Medications:   . dextrose Stopped (02/19/15 0044)   . antiseptic oral rinse  7 mL Mouth Rinse q12n4p  . aspirin  325 mg Oral Daily  . atorvastatin  40 mg Oral q1800  . chlorhexidine  15 mL Mouth Rinse BID  . insulin aspart  0-15 Units Subcutaneous 6 times per day  . pantoprazole (PROTONIX) IV  40 mg Intravenous QHS  . piperacillin-tazobactam (ZOSYN)  IV  2.25 g Intravenous 4 times per day  . potassium chloride  10 mEq Intravenous Q1 Hr x 2  . sodium chloride  3 mL Intravenous Q12H  . vancomycin  1,000 mg Intravenous Q48H   sodium chloride, sodium chloride, hydrALAZINE, sodium chloride  Assessment/ Plan:  1. AKI on CKD Stage IV: Likely secondary to ischemic ATN from sepsis. Cr has plateaued and now coming  down  2. Hemoptysis/Acute Respiratory Failure: Patient presented with 1-2 days of hemoptysis and dyspnea requiring intubation and ventilation. CT Chest concerning for diffuse alveolar hemorrhage and possibly a PNA. Bronchoscopy performed 9/28. Both sets of BAL, fungal, and AFB cultures negative so far (9/27 and 9/28). Blood cultures negative. Resp culture 9/27 with rare GPC in pairs. Currently on Vanc and Zosyn. Management per primary team.  3. Hyperkalemia: Was hyperkalemia now hypokalemia Replete K  4. Hypophosphatemia: Ph now improved 5. Normocytic Anemia: Hgb 9.4    6. HTN: BPs [id number] systolic. Not on pressors. Patient is on Amlodipine 10 mg daily at home. Currently has Hydralazine PRN for SBP > 180.  7. Type 2  DM: Last HbA1c 8.7 on 9/16. CBGs controlled in hospital in 80s-160s mostly. He is on Glipizide 10 mg daily and Lantus 50 units QHS at home. Currently on Levemir 45 units QHS with moderate ISS. Management per primary team.    LOS: 5 Russell Price W @TODAY @9 :04 AM

## 2015-02-19 NOTE — Progress Notes (Signed)
eLink Physician-Brief Progress Note Patient Name: ITALO LAFARY DOB: June 04, 1960 MRN: [id number]   Date of Service  02/19/2015  HPI/Events of Note  K 3.1. Renal function is improving  eICU Interventions  Cautious repletion of K     Intervention Category Intermediate Interventions: Electrolyte abnormality - evaluation and management  Josselin Gaulin 02/19/2015, 6:28 AM

## 2015-02-19 NOTE — Progress Notes (Signed)
STROKE TEAM PROGRESS NOTE  HPI Russell Price is an 54 y.o. male hx of HTN, DM who presented to ED on 9/27 with hemoptysis, shortly after developed dyspnea and tachycardia. Intubated in the ED for airway protection. Unclear etiology of presentation. Had auto-immune panel sent which was unremarkable. Started on high dose steroids and broad spectrum antibiotics. Neurology consulted after CT head imaging showed a likely infarct in the anterior right thalamus (imaging reviewed). Hospital course complicated by AKI on CKD for which nephrology is following.    Date last known well: unclear Time last known well: unclear tPA Given: no, unclear LSW Modified Rankin: 1   SUBJECTIVE (INTERVAL HISTORY) Two family members at the bedside. The patient is extubated  Y`day and doing well. alert and follows commands. His hemoptysis has improved   OBJECTIVE Temp:  [98.5 F (36.9 C)-99.6 F (37.6 C)] 98.6 F (37 C) (10/02 0829) Pulse Rate:  [47-74] 53 (10/02 0700) Cardiac Rhythm:  [-] Sinus bradycardia (10/02 0400) Resp:  [14-25] 16 (10/02 0700) BP: (159-184)/(59-81) 160/66 mmHg (10/02 0700) SpO2:  [95 %-100 %] 99 % (10/02 0700) Weight:  [182 lb 8.7 oz (82.8 kg)] 182 lb 8.7 oz (82.8 kg) (10/02 0400)  CBC:   Recent Labs Lab 02/18/15 0343 02/19/15 0422  WBC 9.0 6.3  NEUTROABS 7.8* 4.8  HGB 9.4* 9.4*  HCT 27.0* 28.4*  MCV 84.1 85.0  PLT 110* 119*    Basic Metabolic Panel:   Recent Labs Lab 02/18/15 0343 02/19/15 0422  NA 146* 145  K 3.3* 3.1*  CL 107 108  CO2 27 27  GLUCOSE 116* 143*  BUN 91* 67*  CREATININE 5.14* 4.27*  CALCIUM 7.5* 7.5*  MG 2.5* 2.3  PHOS 5.8* 4.6    Lipid Panel:     Component Value Date/Time   CHOL 191 02/18/2015 0344   TRIG 290* 02/18/2015 0344   HDL 23* 02/18/2015 0344   CHOLHDL 8.3 02/18/2015 0344   VLDL 58* 02/18/2015 0344   LDLCALC 110* 02/18/2015 0344   HgbA1c:  Lab Results  Component Value Date   HGBA1C 8.7 02/03/2015   Urine Drug Screen:  No results found for: LABOPIA, COCAINSCRNUR, LABBENZ, AMPHETMU, THCU, LABBARB    IMAGING  Ct Head Wo Contrast 02/17/2015    Low-density in the anterior right thalamus suspicious for acute infarct. Further evaluation with brain MRI is recommended.      MRI / MRA - pending   PHYSICAL EXAM  Pleasant African-male who is intubated but not sedated. . Afebrile. Head is nontraumatic. Neck is supple without bruit.    Cardiac exam no murmur or gallop. Lungs are clear to auscultation. Distal pulses are well felt.  Neurological Exam : Awake alert  Oriented x 3 . able to follow commands quite well.   Extraocular movements are full range. Slight restriction of upgaze. Pupils irregular but reactive. Vision acuity adequate. Blinks to threat bilaterally. No facial weakness. Tongue midline. Good cough and gag. Able to move all 4 extremities against gravity but mild weakness of left grip, hand and hip flexors   . Coordination and gait cannot be reliably tested.   ASSESSMENT/PLAN Russell Price is a 54 y.o. male with history of hypertension, diabetes, and chronic kidney disease presenting with hemoptysis, dyspnea, and tachycardia.Marland Kitchen He did not receive IV t-PA due to unknown time of onset.   Stroke:  Non-dominant infarct rule out PFO.  Resultant   Mild left hemiparesis and upgaze restriction MRI  Ill-defined region of altered signal intensity  right peri third ventricular region/ anterior lateral margin of the right thalamus  may represent an acute/subacute infarct  MRA  motion degraded. Mild irregularities without high-grade stenosis  Carotid Doppler pending  2D Echo EF 45-50%. No cardiac source of emboli identified.  Bilateral lower extremity duplex - pending  LDL 110  HgbA1c pending  VTE prophylaxis - SCDs DIET DYS 3 Room service appropriate?: Yes; Fluid consistency:: Thin  no antithrombotic prior to admission, now on aspirin 325 mg orally every day  Patient counseled to be compliant  with his antithrombotic medications  Ongoing aggressive stroke risk factor management  Therapy recommendations: Pending  Disposition: Pending  Hypertension  Stable  Permissive hypertension (OK if < 220/120) but gradually normalize in 5-7 days  Hyperlipidemia  Home meds: No lipid lowering medications prior to admission  LDL 110, goal < 70  Add Lipitor 40 mg daily  Continue statin at discharge  Diabetes  HgbA1c pending, goal < 7.0  Uncontrolled  Other Stroke Risk Factors  Cigarette smoker, quit smoking 31 years ago.   Other Active Problems  Anemia  Mild hypokalemia  Hospital day # 5  I have personally examined this patient, reviewed notes, independently viewed imaging studies, participated in medical decision making and plan of care. I have made any additions or clarifications directly to the above note. Agree with note above. He presented with hemoptysis secondary to recurrent coughing and brain imaging shows a tiny right anterior thalamic infarct etiology to be determined but strong possibility of paradoxical embolism and evaluation for lower extremity venous clots and PFO is necessary. Patient is at risk for recurrent strokes, neurological worsening and needs ongoing stroke evaluation and aggressive risk factor control. I had a long discussion with the patient's wife and daughter and patient at the bedside about his stroke, plan for evaluation, discuss results of imaging studies and answered questions. Discussed with critical care physician Asst.Provider,  Check LE venous dopplers for DVT. We will check transcranial Doppler bubble study tomorrow to look for PFO  Start aspirin for stroke prevention if okay with pulmonary critical care    Antony Contras, MD Medical Director [place] Pager: [phone] 02/19/2015 11:33 AM     To contact Stroke Continuity provider, please refer to [email or web address] After hours, contact General Neurology

## 2015-02-19 NOTE — Progress Notes (Signed)
PULMONARY / CRITICAL CARE MEDICINE   Name: Russell Price MRN: [id number] DOB: 1960/06/27    ADMISSION DATE:  02/14/2015 CONSULTATION DATE:  02/14/2015  REFERRING MD :  EDP Mackuen  CHIEF COMPLAINT:  SOB  INITIAL PRESENTATION: 54 year old [place] male with no cardiac history presented with SOB and hemoptysis. CXR showed R>L edema. He was intubated in ED for airway protection in setting of copious bloody secretions. PCCM to admit.   STUDIES:  CT chest 9/27 -   Patchy bilateral consolidation. No pathologic mediastinal LAD. Portable CXR 9/28 - ETT in good position & Right IJ in good position. Bilateral opacities R>L. Renal U/S 9/28 - No hydronephrosis or solid mass. TTE 9/28 - EF 45-50%. Mild diffuse hypokinesis. No regional wall motion abnormality. Grade 2 diastolic dysfunction.  SIGNIFICANT EVENTS: 9/27 - Admitted to hospital 9/27 - Intubated & central line placed. Attempted BAL. 9/28 - BAL by [facility] Nephrology consulted 9/30 Neurology consult 10/1 extubated   SUBJECTIVE:   Doing well , sitting up in chair  Extubated yesterday with good sats on RA    VITAL SIGNS: Temp:  [98.5 F (36.9 C)-99.6 F (37.6 C)] 98.6 F (37 C) (10/02 0829) Pulse Rate:  [47-86] 53 (10/02 0700) Resp:  [14-25] 16 (10/02 0700) BP: (159-193)/(59-81) 160/66 mmHg (10/02 0700) SpO2:  [95 %-100 %] 99 % (10/02 0700) Weight:  [82.8 kg (182 lb 8.7 oz)] 82.8 kg (182 lb 8.7 oz) (10/02 0400) HEMODYNAMICS:   VENTILATOR SETTINGS:   INTAKE / OUTPUT:  Intake/Output Summary (Last 24 hours) at 02/19/15 1045 Last data filed at 02/19/15 0700  Gross per 24 hour  Intake 2299.69 ml  Output   2240 ml  Net  59.69 ml    PHYSICAL EXAMINATION: General:  Comfortable. No distress. Alert  Integument:  Warm & dry. No rash on exposed skin.  HEENT:  No scleral injection or icterus.   Cardiovascular:  RRR No edema. No appreciable JVD.  Pulmonary:  Coarse breath sounds   Abdomen: Soft. Normal bowel sounds. Nondistended.   Neurological: Sedated. Spontaneously moves all 4 extremities. Pupils equal and round.  Follows commands   LABS:  CBC  Recent Labs Lab 02/17/15 0445 02/18/15 0343 02/19/15 0422  WBC 10.1 9.0 6.3  HGB 9.1* 9.4* 9.4*  HCT 25.9* 27.0* 28.4*  PLT 118* 110* 119*   Coag's  Recent Labs Lab 02/17/15 0445 02/18/15 0343 02/19/15 0422  APTT 26 24 25   INR 1.15 1.28 1.18   BMET  Recent Labs Lab 02/17/15 0445 02/18/15 0343 02/19/15 0422  NA 145 146* 145  K 3.7 3.3* 3.1*  CL 106 107 108  CO2 26 27 27   BUN 84* 91* 67*  CREATININE 5.76* 5.14* 4.27*  GLUCOSE 144* 116* 143*   Electrolytes  Recent Labs Lab 02/17/15 0445 02/18/15 0343 02/19/15 0422  CALCIUM 7.7* 7.5* 7.5*  MG 2.3 2.5* 2.3  PHOS 7.0* 5.8* 4.6   Sepsis Markers  Recent Labs Lab 02/14/15 1619 02/14/15 2118 02/14/15 2122  LATICACIDVEN 5.13*  --  3.3*  PROCALCITON  --  7.12  --    ABG  Recent Labs Lab 02/14/15 1837 02/15/15 0305 02/15/15 0455  PHART 7.260* 7.357 7.347*  PCO2ART 36.5 31.2* 36.2  PO2ART 86.0 227* 307*   Liver Enzymes  Recent Labs Lab 02/14/15 1604  02/17/15 0445 02/18/15 0343 02/19/15 0422  AST 36  --   --   --   --   ALT 19  --   --   --   --  ALKPHOS 92  --   --   --   --   BILITOT 1.0  --   --   --   --   ALBUMIN 3.1*  < > 2.3* 2.3* 2.2*  < > = values in this interval not displayed. Cardiac Enzymes  Recent Labs Lab 02/14/15 2300 02/15/15 0520 02/16/15 0445  TROPONINI 5.50* 11.78* 8.20*   Glucose  Recent Labs Lab 02/18/15 1528 02/18/15 2019 02/18/15 2359 02/19/15 0351 02/19/15 0645 02/19/15 0828  GLUCAP 112* 164* 239* 133* 124* 165*    Imaging Mr Jodene Nam Head Wo Contrast  02/18/2015   CLINICAL DATA:  54 year old diabetic hypertensive male with dizziness and altered mental status. Subsequent encounter.  EXAM: MRI HEAD WITHOUT CONTRAST  MRA HEAD WITHOUT CONTRAST  TECHNIQUE: Multiplanar, multiecho pulse sequences of the brain and surrounding structures  were obtained without intravenous contrast. Angiographic images of the head were obtained using MRA technique without contrast.  COMPARISON:  02/17/2015 head CT.  No comparison brain MR.  FINDINGS: MRI HEAD FINDINGS  Exam is motion degraded.  Ill-defined region of altered signal intensity right peri third ventricular region/ anterior lateral margin of the right thalamus may represent an acute/subacute infarct although given the indistinct appearance, follow-up imaging will be necessary to exclude the possibility of this representing result of infection or mass. This causes slight impression upon the right lateral aspect of the third ventricle.  Focal region of blood breakdown products superior vermis. Question small cavernoma as versus result of hemorrhage from prior infarct or trauma.  Tiny nonspecific tiny cerebral spinal fluid appearing structures adjacent to the left periatrial region may represent result of prior insult, small perivascular spaces or tiny neural glial cyst.  Partial opacification mastoid air cells without obstructing lesion of the eustachian tube noted.  No hydrocephalus.  Prominent mineral deposition globus pallidus incidentally noted.  MRA HEAD FINDINGS  Exam is motion degraded.  Mild to moderate narrowing cavernous segment right internal carotid artery.  Mild narrowing supraclinoid segment internal carotid artery bilaterally.  Focal stenosis of left M2 segment branch.  Left vertebral artery ends in a left posterior inferior cerebellar artery distribution. The left posterior inferior cerebellar artery is narrowed and irregular.  No high-grade stenosis of the distal right vertebral artery.  Narrowed right posterior inferior cerebellar artery.  Mild narrowing mid aspect of the basilar artery.  Mild narrowing of portions of the anterior inferior cerebellar artery bilaterally.  Moderate narrowing right superior cerebellar artery.  Mild to moderate focal narrowing proximal left posterior cerebral  artery.  No aneurysm noted.  IMPRESSION: MRI HEAD  Exam is motion degraded.  Ill-defined region of altered signal intensity right peri third ventricular region/ anterior lateral margin of the right thalamus may represent an acute/subacute infarct although given the indistinct appearance, follow-up imaging will be necessary to exclude the possibility of this representing result of infection or mass. This causes slight impression upon the right lateral aspect of the third ventricle.  Focal region of blood breakdown products superior vermis. Question small cavernoma as versus result of hemorrhage from prior infarct or trauma.  MRA HEAD  Exam is motion degraded.  Mild to moderate narrowing cavernous segment right internal carotid artery.  Mild narrowing supraclinoid segment internal carotid artery bilaterally.  Focal stenosis of left M2 segment branch.  Left vertebral artery ends in a left posterior inferior cerebellar artery distribution. The left posterior inferior cerebellar artery is narrowed and irregular.  No high-grade stenosis of the distal right vertebral artery.  Narrowed right  posterior inferior cerebellar artery.  Mild narrowing mid aspect of the basilar artery.  Mild narrowing of portions of the anterior inferior cerebellar artery bilaterally.  Moderate narrowing right superior cerebellar artery.  Mild to moderate focal narrowing proximal left posterior cerebral artery.   Electronically Signed   By: Genia Del M.D.   On: 02/18/2015 17:59   Mr Brain Wo Contrast  02/18/2015   CLINICAL DATA:  54 year old diabetic hypertensive male with dizziness and altered mental status. Subsequent encounter.  EXAM: MRI HEAD WITHOUT CONTRAST  MRA HEAD WITHOUT CONTRAST  TECHNIQUE: Multiplanar, multiecho pulse sequences of the brain and surrounding structures were obtained without intravenous contrast. Angiographic images of the head were obtained using MRA technique without contrast.  COMPARISON:  02/17/2015 head CT.  No  comparison brain MR.  FINDINGS: MRI HEAD FINDINGS  Exam is motion degraded.  Ill-defined region of altered signal intensity right peri third ventricular region/ anterior lateral margin of the right thalamus may represent an acute/subacute infarct although given the indistinct appearance, follow-up imaging will be necessary to exclude the possibility of this representing result of infection or mass. This causes slight impression upon the right lateral aspect of the third ventricle.  Focal region of blood breakdown products superior vermis. Question small cavernoma as versus result of hemorrhage from prior infarct or trauma.  Tiny nonspecific tiny cerebral spinal fluid appearing structures adjacent to the left periatrial region may represent result of prior insult, small perivascular spaces or tiny neural glial cyst.  Partial opacification mastoid air cells without obstructing lesion of the eustachian tube noted.  No hydrocephalus.  Prominent mineral deposition globus pallidus incidentally noted.  MRA HEAD FINDINGS  Exam is motion degraded.  Mild to moderate narrowing cavernous segment right internal carotid artery.  Mild narrowing supraclinoid segment internal carotid artery bilaterally.  Focal stenosis of left M2 segment branch.  Left vertebral artery ends in a left posterior inferior cerebellar artery distribution. The left posterior inferior cerebellar artery is narrowed and irregular.  No high-grade stenosis of the distal right vertebral artery.  Narrowed right posterior inferior cerebellar artery.  Mild narrowing mid aspect of the basilar artery.  Mild narrowing of portions of the anterior inferior cerebellar artery bilaterally.  Moderate narrowing right superior cerebellar artery.  Mild to moderate focal narrowing proximal left posterior cerebral artery.  No aneurysm noted.  IMPRESSION: MRI HEAD  Exam is motion degraded.  Ill-defined region of altered signal intensity right peri third ventricular region/  anterior lateral margin of the right thalamus may represent an acute/subacute infarct although given the indistinct appearance, follow-up imaging will be necessary to exclude the possibility of this representing result of infection or mass. This causes slight impression upon the right lateral aspect of the third ventricle.  Focal region of blood breakdown products superior vermis. Question small cavernoma as versus result of hemorrhage from prior infarct or trauma.  MRA HEAD  Exam is motion degraded.  Mild to moderate narrowing cavernous segment right internal carotid artery.  Mild narrowing supraclinoid segment internal carotid artery bilaterally.  Focal stenosis of left M2 segment branch.  Left vertebral artery ends in a left posterior inferior cerebellar artery distribution. The left posterior inferior cerebellar artery is narrowed and irregular.  No high-grade stenosis of the distal right vertebral artery.  Narrowed right posterior inferior cerebellar artery.  Mild narrowing mid aspect of the basilar artery.  Mild narrowing of portions of the anterior inferior cerebellar artery bilaterally.  Moderate narrowing right superior cerebellar artery.  Mild to moderate  focal narrowing proximal left posterior cerebral artery.   Electronically Signed   By: Genia Del M.D.   On: 02/18/2015 17:59   Dg Chest Port 1 View  02/18/2015   CLINICAL DATA:  Followup edema/infection.  EXAM: PORTABLE CHEST 1 VIEW  COMPARISON:  02/15/2015  FINDINGS: Cardiomegaly again noted.  A right IJ central venous catheter is unchanged with tip overlying the mid - upper SVC.  Bilateral airspace opacities have improved.  No definite pleural effusion or pneumothorax noted.  IMPRESSION: Decreased bilateral airspace disease.  Cardiomegaly.   Electronically Signed   By: Margarette Canada M.D.   On: 02/18/2015 13:33     ASSESSMENT / PLAN:  PULMONARY OETT 9/27 >>> A: Acute hypoxic respiratory failure - resolved  DAH vs Pneumonia Diffuse  alveolar hemorrhage - (ANA, RF, CCP, GBM, ANCA, MPO, & PR3 negative)    P:    See ID Check cxr in am    CARDIOVASCULAR A:  Sinus tachycardia - resolved Hypertensive emergency - resolved  Elevated troponin ?demand process -  Echo w/ mild hypokinesis , EF 45-50%  P:  Cont Statin  Consider cards workup in OP setting   RENAL A:   Acute on Chronic Renal Failure - UOP improving. Function improving. Anion Gap metabolic Acidosis - resolved Lactic Acidosis - improving Hyperkalemia - resolved Hypokalemia   P:   Monitor UOP Following Electrolytes daily  Nephrology following K+ replaced   GASTROINTESTINAL A:   No acute issues  P:   Diet  D/c IV PPI   HEMATOLOGIC A:   Anemia - diffuse alveolar hemorrhage. Improving. Leukocytosis - possibly infectious in etiology. Resolved.  Thrombocytopenia - likely consumption. Improving.  P:  Follow Hgb w/ CBC daily Follow platelets daily w/ CBC Transfuse per ICU guidelines SCDs Venous dopplers pending   INFECTIOUS A:   HCAP vs atypical pneumonia Leukocytosis  P:   Viral resp panel>neg    BCx2 9/27 >>>  U strep >>>neg U legionella >>>neg  BAL 9/28>>>neg AFB 9/28>>> smear negative Fungal cx 9/28>>> Cytology 9/28>>>negative  Sputum BAL 9/27>>>Oral flora AFB stain and cultures> smear negative Fungal Cx 9/27 >>> Cytology 9/27 > negative  Abx: Zosyn, start date 9/27 >>> Abx: Vancomycin, start date 9/27 >>>   ENDOCRINE A:   H/O DM-2 Hypoglycemia 10/1 >resolved  P:   Accu-Cheks every 4 hours  Low-dose sliding scale algorithm D/c Levemir 10/1>may need to restart if bs tr up  D/c D5 as has diet now   NEUROLOGIC A:   Acute Encephalopathy resolved  CVA >infarct in anterior right thalamus , MRI with ill defined region right thalamus may  Represent acute/subacute Infarct . MRA w/ mild to mod narrowing w/ no high grade stenosis   P:   Neuro following Ven doppler /carotid doppler pending    FAMILY  -  Updates: Family updated by me at bedside today.  - Inter-disciplinary family meet or Palliative Care meeting due by:  10/4  TODAY'S SUMMARY:  Patient with questionable pulmonary renal syndrome with renal failure and hemoptysis but autoimmune neg thus far.  Cultures are pending from specimens obtained. Bronchoalveolar lavage shows diffuse alveolar hemorrhage. His serologies have been negative.   Continuing broad-spectrum antibiotic coverage. He is weaning well , will proceede with extubation.  CT showed likely infarct in anterior right thalamus . MRI is pending later today . Venous dopplers pending . Echo showed mild hypokinesis with elevated troponin questionable demand. May need cards eval going forward. Hypoglycemia resolved, tolerating diet. Transfer to [facility]  to [facility].    Tabytha Gradillas NP-C  [facility]  [phone]   02/19/2015 10:45 AM

## 2015-02-19 NOTE — Progress Notes (Signed)
Pt arrived from [facility]M via nurse and family members. VSS and reporting no pain. Oriented to room and equipment. Will continue to monitor. Russell Price E, [place] 02/19/2015 2023

## 2015-02-20 ENCOUNTER — Inpatient Hospital Stay (HOSPITAL_COMMUNITY): Payer: BLUE CROSS/BLUE SHIELD

## 2015-02-20 ENCOUNTER — Encounter (HOSPITAL_COMMUNITY): Payer: Self-pay | Admitting: Radiology

## 2015-02-20 DIAGNOSIS — I6330 Cerebral infarction due to thrombosis of unspecified cerebral artery: Secondary | ICD-10-CM

## 2015-02-20 DIAGNOSIS — N179 Acute kidney failure, unspecified: Secondary | ICD-10-CM | POA: Insufficient documentation

## 2015-02-20 DIAGNOSIS — I639 Cerebral infarction, unspecified: Secondary | ICD-10-CM

## 2015-02-20 DIAGNOSIS — I214 Non-ST elevation (NSTEMI) myocardial infarction: Secondary | ICD-10-CM

## 2015-02-20 DIAGNOSIS — N289 Disorder of kidney and ureter, unspecified: Secondary | ICD-10-CM

## 2015-02-20 DIAGNOSIS — I6789 Other cerebrovascular disease: Secondary | ICD-10-CM

## 2015-02-20 DIAGNOSIS — J9601 Acute respiratory failure with hypoxia: Secondary | ICD-10-CM

## 2015-02-20 LAB — GLUCOSE, CAPILLARY
GLUCOSE-CAPILLARY: 29 mg/dL — AB (ref 65–99)
GLUCOSE-CAPILLARY: 58 mg/dL — AB (ref 65–99)
GLUCOSE-CAPILLARY: 307 mg/dL — AB (ref 65–99)
GLUCOSE-CAPILLARY: 233 mg/dL — AB (ref 65–99)
GLUCOSE-CAPILLARY: 185 mg/dL — AB (ref 65–99)
Glucose-Capillary: 333 mg/dL — ABNORMAL HIGH (ref 65–99)

## 2015-02-20 LAB — RENAL FUNCTION PANEL
ALBUMIN: 2.4 g/dL — AB (ref 3.5–5.0)
ANION GAP: 12 (ref 5–15)
BUN: 57 mg/dL — ABNORMAL HIGH (ref 6–20)
CALCIUM: 7.6 mg/dL — AB (ref 8.9–10.3)
CO2: 20 mmol/L — ABNORMAL LOW (ref 22–32)
Chloride: 106 mmol/L (ref 101–111)
Creatinine, Ser: 3.86 mg/dL — ABNORMAL HIGH (ref 0.61–1.24)
GFR calc non Af Amer: 16 mL/min — ABNORMAL LOW (ref >60)
GFR, EST AFRICAN AMERICAN: 19 mL/min — AB (ref >60)
GLUCOSE: 300 mg/dL — AB (ref 65–99)
PHOSPHORUS: 4.4 mg/dL (ref 2.5–4.6)
POTASSIUM: 3.7 mmol/L (ref 3.5–5.1)
SODIUM: 138 mmol/L (ref 135–145)

## 2015-02-20 LAB — HEMOGLOBIN A1C
Hgb A1c MFr Bld: 8.5 % — ABNORMAL HIGH (ref 4.8–5.6)
MEAN PLASMA GLUCOSE: 197 mg/dL

## 2015-02-20 LAB — CBC WITH DIFFERENTIAL/PLATELET
BASOS ABS: 0 10*3/uL (ref 0.0–0.1)
BASOS PCT: 0 %
Eosinophils Absolute: 0.2 10*3/uL (ref 0.0–0.7)
Eosinophils Relative: 2 %
HEMATOCRIT: 32.1 % — AB (ref 39.0–52.0)
HEMOGLOBIN: 11 g/dL — AB (ref 13.0–17.0)
LYMPHS PCT: 10 %
Lymphs Abs: 0.7 10*3/uL (ref 0.7–4.0)
MCH: 28.8 pg (ref 26.0–34.0)
MCHC: 34.3 g/dL (ref 30.0–36.0)
MCV: 84 fL (ref 78.0–100.0)
Monocytes Absolute: 0.4 10*3/uL (ref 0.1–1.0)
Monocytes Relative: 6 %
NEUTROS ABS: 5.7 10*3/uL (ref 1.7–7.7)
NEUTROS PCT: 82 %
Platelets: 120 10*3/uL — ABNORMAL LOW (ref 150–400)
RBC: 3.82 MIL/uL — AB (ref 4.22–5.81)
RDW: 12.8 % (ref 11.5–15.5)
WBC: 7 10*3/uL (ref 4.0–10.5)

## 2015-02-20 LAB — MAGNESIUM: Magnesium: 1.8 mg/dL (ref 1.7–2.4)

## 2015-02-20 LAB — APTT: APTT: 26 s (ref 24–37)

## 2015-02-20 LAB — PROTIME-INR
INR: 1.12 (ref 0.00–1.49)
PROTHROMBIN TIME: 14.6 s (ref 11.6–15.2)

## 2015-02-20 MED ORDER — STROKE: EARLY STAGES OF RECOVERY BOOK
Freq: Once | Status: DC
  Filled 2015-02-20: qty 1

## 2015-02-20 MED ORDER — AMLODIPINE BESYLATE 10 MG PO TABS
10.0000 mg | ORAL_TABLET | Freq: Every day | ORAL | Status: DC
  Administered 2015-02-20 – 2015-02-22 (×3): 10 mg via ORAL
  Filled 2015-02-20 (×3): qty 1

## 2015-02-20 NOTE — Progress Notes (Signed)
Inpatient Diabetes Program Recommendations  AACE/ADA: New Consensus Statement on Inpatient Glycemic Control (2015)  Target Ranges:  Prepandial:   less than 140 mg/dL      Peak postprandial:   less than 180 mg/dL (1-2 hours)      Critically ill patients:  140 - 180 mg/dL   Results for KOLT, RATHSACK (MRN [id number]) as of 02/20/2015 08:36  Ref. Range 02/19/2015 08:28 02/19/2015 12:06 02/19/2015 16:42 02/19/2015 19:40 02/19/2015 22:30  Glucose-Capillary Latest Ref Range: 65-99 mg/dL 165 (H) 314 (H) 286 (H) 240 (H) 226 (H)   Review of Glycemic Control  Diabetes history: DM 2 Outpatient Diabetes medications: Lantus 50 units, Glipizide 10 mg QAM, 5 mg QPM Current orders for Inpatient glycemic control: Novolog Moderate scale TID  Inpatient Diabetes Program Recommendations: Insulin - Basal: Glucose in 200-300 range. Note patient had hypoglycemia in the 20's on 45 units of basal. Please consider starting a small portion of patient's home dose. Lantus 10 units Q24hrs.  Thanks,  Tama Headings RN, MSN, [facility] Inpatient Diabetes Coordinator Team Pager [phone] (8a-5p)

## 2015-02-20 NOTE — Care Management Note (Signed)
Case Management Note  Patient Details  Name: Russell Price MRN: [id number] Date of Birth: 06/13/1960  Subjective/Objective:            Date-02-20-15 Monday  Patient transferred from another unit 02-19-15 Spoke with patient at the bedside along with wife.  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  Verified patient lives at home with wife.  Verified patient anticipates to go CIR at time of discharge. Patient has no DME. Expressed potential need for walker.  Patient denied needing help with their medication.  Patient drives or is driven by wife to MD appointments.  Verified patient has PCP at [facility].  Plan: CM will continue to follow for discharge planning and [facility] resources.   Carles Collet RN BSN CM [phone]         Action/Plan:   Expected Discharge Date:                  Expected Discharge Plan:  Home/Self Care  In-House Referral:     Discharge planning Services  CM Consult  Post Acute Care Choice:    Choice offered to:     DME Arranged:    DME Agency:     HH Arranged:    HH Agency:     Status of Service:  In process, will continue to follow  Medicare Important Message Given:    Date Medicare IM Given:    Medicare IM give by:    Date Additional Medicare IM Given:    Additional Medicare Important Message give by:     If discussed at [place] of Stay Meetings, dates discussed:    Additional Comments:  Carles Collet, RN 02/20/2015, 11:41 AM

## 2015-02-20 NOTE — Progress Notes (Signed)
*  PRELIMINARY RESULTS* Echocardiogram 2D Echocardiogram with bubble study has been performed.  Leavy Cella 02/20/2015, 11:19 AM

## 2015-02-20 NOTE — Progress Notes (Signed)
*  PRELIMINARY RESULTS* Vascular Ultrasound Carotid Duplex (Doppler). Left ICA 1-39% stenosis. Right carotid not imaged due to IJ line/ bandages.  Lower extremity venous duplex. No evidence of DVT or baker's cyst.    Landry Mellow, RDMS, RVT  02/20/2015, 4:48 PM

## 2015-02-20 NOTE — Progress Notes (Signed)
Family Medicine Teaching Service Daily Progress Note Intern Pager: [phone]  Patient name: Russell Price Medical record number: [id number] Date of birth: 26-Jun-1960 Age: 54 y.o. Gender: male  Primary Care Provider: No PCP Per Patient Consultants: card, neuro, CCM,  Code Status: full  Pt Overview and Major Events to Date:  9/27 - Admitted to hospital 9/27 - Intubated & central line placed. Attempted BAL. 9/28 - BAL by [facility] & Nephrology consulted 9/30 - Neurology consult 10/01Shella Spearing, nephrology consult 10/3 - ICU to  Floor, Card consult  Assessment and Plan: Acute hypoxic respiratory failure - resolved. SOB and hemoptysis on presentation. CXR showed R>L edema. He was intubated in ED for airway protection in setting of copious bloody secretions. Admitted to ICU. Extubated on 10/01. DAH vs Pneumonia. Diffuse alveolar hemorrhage - (ANA, RF, CCP, GBM, ANCA,MPO, & PR3 negative). Works at a [employer] at [employer]. Reports exposure to chemical but uses face mask. Lung exam with no wheeze or crackles. No work of breathing. Sating 99-100 on RA. BCx2  Newly diagnosed CHF: Echo on 9/28 w/ mild hypokinesis, EF 45-50% -follow card recs  Hypertensive emergency- resolved. Elevated troponin ?demand process .BP 172/68 this AM. On hydralazine PRN.  -follow up card recs -Card consulted for newly diagnosed HF -Follow up card recs  AoCKD with AGMA - baseline Cr seems to be 2.5. sCr 4.28 on admission>5.9>>>3.86 (today). UOP improving. Anion Gap metabolic Acidosis - resolved -Monitor UOP -Following Electrolytes daily  -Nephrology following -K+ replaced   Hyperkalemia - resolved. K 3.7 today  Normocytic Anemia - baseline Hgb seems 11. Hgb 11 today. Likely secondary to CKD. -H&H stable -follow CBC  HCAP vs atypical pneumonia. With Leukocytosis that has resolved. Viral resp panel, U strep, U legionella, BAL 9/28,  AFB 9/28, smear, fungal cx, cytology, Sputum BAL ,Oral flora. AFB stain and cultures  & smear negative.  -Stopping antibiotics today -Zosyn, start date 9/27-10/03 -Vancomycin, start date 9/27-10/03   H/O DM-2: A1c 8.5 down from 10.3 three months ago. On Lantus 50 units at home. Had Hypoglycemic evens on 10/1 that has resolved. CBG in 200's and 300's on SSI. Elevated glucose likely from steroid and ICU stress. -Accu-Cheks every 4 hours  -Continue SSI -Will consider restarting home Lantus   CVA: infarct in anterior right thalamus , MRI with ill defined region right thalamus may represent acute/subacute Infarct . MRA w/ mild to mod narrowing w/ no high grade stenosis. AMS resolved -Neuro following -continue Lipitor  -continue ASA -Ven doppler /carotid doppler pending -PT/OT  Disposition: off IV antibiotics today. Possible discharge tomorrow pending card, neuro and nephrology recs. Subjective:  Saw patient this morning. He was sitting in the bed with wife and brother at bedside. He reports sleeping well. Denies pain, SOB, or chest pain.   Objective: Temp:  [98.5 F (36.9 C)-99.3 F (37.4 C)] 98.9 F (37.2 C) (10/03 0506) Pulse Rate:  [57-68] 68 (10/03 0506) Resp:  [18-26] 18 (10/03 0506) BP: (164-195)/(61-78) 177/77 mmHg (10/03 0506) SpO2:  [92 %-100 %] 99 % (10/03 0506) Weight:  [187 lb 13.3 oz (85.2 kg)-188 lb 0.8 oz (85.3 kg)] 188 lb 0.8 oz (85.3 kg) (10/03 0506) Physical Exam: Gen: NAD, well-appearing  Eyes: cataract in right eye,  no conjunctival injection Nares: clear, no erythema, swelling or congestion Oropharynx: clear, moist CV: RRR. S1 & S2 audible, no murmurs, rubs. 2+ radial and DP pulses bilaterally. Resp: no apparent WOB, good aeration bilaterally, CTAB. Abd: +BS. Soft, NDNT, no rebound or guarding.  Ext: No edema or gross deformities. Neuro: Alert and oriented, No gross focal deficits  Laboratory:  Recent Labs Lab 02/17/15 0445 02/18/15 0343 02/19/15 0422  WBC 10.1 9.0 6.3  HGB 9.1* 9.4* 9.4*  HCT 25.9* 27.0* 28.4*  PLT 118* 110*  119*    Recent Labs Lab 02/14/15 1604  02/17/15 0445 02/18/15 0343 02/19/15 0422  NA 137  < > 145 146* 145  K 5.6*  < > 3.7 3.3* 3.1*  CL 106  < > 106 107 108  CO2 15*  < > 26 27 27   BUN 43*  < > 84* 91* 67*  CREATININE 4.28*  < > 5.76* 5.14* 4.27*  CALCIUM 8.6*  < > 7.7* 7.5* 7.5*  PROT 6.6  --   --   --   --   BILITOT 1.0  --   --   --   --   ALKPHOS 92  --   --   --   --   ALT 19  --   --   --   --   AST 36  --   --   --   --   GLUCOSE 363*  < > 144* 116* 143*  < > = values in this interval not displayed.   Imaging/Diagnostic Tests: Dg Chest Port 1 View  02/20/2015   CLINICAL DATA:  Pneumonia, ARDS, acute respiratory failure, CVA.  EXAM: PORTABLE CHEST 1 VIEW  COMPARISON:  Portable chest x-ray of February 18, 2015  FINDINGS: The lungs are adequately inflated. The perihilar interstitial markings are increased but not greatly changed from the previous study. The heart is mildly enlarged. The central pulmonary vascularity is prominent. The right internal jugular venous catheter tip projects over the proximal third of the SVC. There is left perihilar subsegmental atelectasis which is more conspicuous today.  IMPRESSION: Slight increased left perihilar subsegmental atelectasis. Stable low-grade pulmonary interstitial edema.   Electronically Signed   By: David  Martinique M.D.   On: 02/20/2015 07:48    Mercy Riding, MD 02/20/2015, 7:30 AM PGY-1, [facility] Intern pager: [phone], text pages welcome

## 2015-02-20 NOTE — Progress Notes (Signed)
  [facility] [address] [place]. [place] [address]. [phone]       TRANSCRANIAL DOPPLER BUBBLE STUDY   [name] Date of Birth:  1960/07/25 Medical Record Number:  [id number]   Indications: Diagnostic Date of Procedure: 02/20/15 Clinical History:  Stroke in setting of coughing Technical Description:   Transcranial Doppler Bubble Study was performed at the bedside after taking verbal  informed consent from the patient and wife and explaining risk/benefits. Left middle cerebral artery were insonated using a headset. Patient had a right IJ central line in place which was used for the test.. Agitated saline injection at rest and after valsalva maneuver did/didnot result in high intensity transient signals (HITS).   Impression:  Negative Transcranial Doppler Bubble Study indicative/not indicative of right to left intracardiac shunt.   Results were explained to the patient and wife. Questions were answered.

## 2015-02-20 NOTE — Progress Notes (Signed)
Rehab Admissions Coordinator Note:  Patient was screened by Retta Diones for appropriateness for an Inpatient Acute Rehab Consult.  At this time, we are recommending Inpatient Rehab consult.  Retta Diones 02/20/2015, 8:37 AM  I can be reached at [phone].

## 2015-02-20 NOTE — Consult Note (Signed)
CARDIOLOGY CONSULT NOTE   Patient ID: Russell Price MRN: [id number], DOB/AGE: 1961-04-09   Admit date: 02/14/2015 Date of Consult: 02/20/2015   Primary Physician: No PCP Per Patient Primary Cardiologist: new   Pt. Profile  pleasant 54 year old [place] male was past medical history of hypertension, DM, and chronic kidney disease Stage IV presented to acute resp failure, hemoptysis requiring intubation. EF 45% on initial echo, improved to 55% later. Trop trended up to 12. Cardiology consulted for HF.   Problem List  Past Medical History  Diagnosis Date  . Diabetes mellitus without complication ([place])     followed by Dr Chalmers Cater  . Hypertension     followed by [facility]  . Chronic kidney disease     Past Surgical History  Procedure Laterality Date  . Bronchoscopy  02/14/2015    for pulm hemorrhage     Allergies  No Known Allergies  HPI   The patient is a pleasant 54 year old [place] male was past medical history of hypertension, DM, and chronic kidney disease Stage IV. He has no past history of tuberculosis, PE, and alcoholism. He does not smoke, does not drink, and denies any prior use of illicit drug. He has no past cardiac history. He was essentially in his usual state of health until the night of 02/13/2015 when he started having mild hemoptysis. The symptom persisted until the following morning on 9/27, prompting the patient to seek medical attention at [facility] ED. He was initially well with hemoptysis being his only complaint, however shortly after he developed profound dyspnea and tachycardia. He was placed on BiPAP and admit admitted to pulm critical care service. A bedside bronchoscopy was performed which showed serosanguineous fluid. He was also intubated for protection of airway. Initial lab was noted for lactic acidosis and hyperkalemia. He was given Kayexalate 30 g. Chest x-ray showed right greater than left pulmonary edema. Renal ultrasound was  performed on 9/28 which showed no evidence of hydronephrosis.  Overnight, patient's troponin trended up from 3 up to 12. This was initially felt to be demand ischemia. EKG did show TWI in inferolateral leads. Echocardiogram performed on 9/28 showed EF [id number], grade 2 diastolic dysfunction. His also hospitalization was complicated by acute on chronic renal insufficiency with creatinine trended up to greater than 5 before trending back down. He was started on high-dose pterygoid on broad-spectrum antibiotic. A CT of the brain was obtained on 9/30 which was concerning for infarct in the anterior right thalamus. MRI of the brain also confirms acute/subacute infarct in the right thalamus, however given indistinct appearance, follow-up imaging will be necessary to exclude the possibility of infection versus mass. Currently lower extremity venous ultrasound is pending. However echo with bubble study was performed on 10/3, there was no interatrial shunting, EF has improved to 55-60%, it does show a pattern of severe LVH, grade 1 diastolic dysfunction. In the morning of 10/3, chest x-ray shows mild interstitial edema, cardiology has been consulted for heart failure.   Inpatient Medications  .  stroke: mapping our early stages of recovery book   Does not apply Once  . antiseptic oral rinse  7 mL Mouth Rinse q12n4p  . aspirin  325 mg Oral Daily  . atorvastatin  40 mg Oral q1800  . chlorhexidine  15 mL Mouth Rinse BID  . insulin aspart  0-15 Units Subcutaneous TID AC & HS  . sodium chloride  3 mL Intravenous Q12H    Family History Family History  Problem Relation  Age of Onset  . Diabetes Mother      Social History Social History   Social History  . Marital Status: Single    Spouse Name: N/A  . Number of Children: N/A  . Years of Education: N/A   Occupational History  . Not on file.   Social History Main Topics  . Smoking status: Former Smoker -- 2.00 packs/day for 4 years    Quit date:  02/23/1983  . Smokeless tobacco: Not on file  . Alcohol Use: No  . Drug Use: No  . Sexual Activity: Not on file   Other Topics Concern  . Not on file   Social History Narrative     Review of Systems  General:  No chills, fever, night sweats or weight changes.  Cardiovascular:  No chest pain, edema, orthopnea, palpitations, paroxysmal nocturnal dyspnea.  Dermatological: No rash, lesions/masses Respiratory: No cough +hemoptysis, dyspnea Urologic: No hematuria, dysuria Abdominal:   No nausea, vomiting, diarrhea, bright red blood per rectum, melena, or hematemesis Neurologic:  No visual changes, wkns, changes in mental status. All other systems reviewed and are otherwise negative except as noted above.  Physical Exam  Blood pressure 172/68, pulse 76, temperature 99.1 F (37.3 C), temperature source Oral, resp. rate 20, height 5\' 11"  (1.803 m), weight 188 lb 0.8 oz (85.3 kg), SpO2 100 %.  General: Pleasant, NAD Psych: Normal affect. Neuro: Alert and oriented X 3. Moves all extremities spontaneously. HEENT: Normal  Neck: Supple without bruits or JVD. Lungs:  Resp regular and unlabored. Mildly decrease bibasilar air movement, but still has breath sound, no obvious rale.  Heart: RRR no s3, s4, or murmurs. Abdomen: Soft, non-tender, non-distended, BS + x 4.  Extremities: No clubbing, cyanosis or edema. DP/PT/Radials 2+ and equal bilaterally.  Labs  No results for input(s): CKTOTAL, CKMB, TROPONINI in the last 72 hours. Lab Results  Component Value Date   WBC 7.0 02/20/2015   HGB 11.0* 02/20/2015   HCT 32.1* 02/20/2015   MCV 84.0 02/20/2015   PLT 120* 02/20/2015    Recent Labs Lab 02/14/15 1604  02/20/15 0745  NA 137  < > 138  K 5.6*  < > 3.7  CL 106  < > 106  CO2 15*  < > 20*  BUN 43*  < > 57*  CREATININE 4.28*  < > 3.86*  CALCIUM 8.6*  < > 7.6*  PROT 6.6  --   --   BILITOT 1.0  --   --   ALKPHOS 92  --   --   ALT 19  --   --   AST 36  --   --   GLUCOSE 363*  <  > 300*  < > = values in this interval not displayed. Lab Results  Component Value Date   CHOL 191 02/18/2015   HDL 23* 02/18/2015   LDLCALC 110* 02/18/2015   TRIG 290* 02/18/2015   No results found for: DDIMER  Radiology/Studies  Ct Head Wo Contrast  02/17/2015   CLINICAL DATA:  Altered mental status. The patient is not moving his left side much.  EXAM: CT HEAD WITHOUT CONTRAST  TECHNIQUE: Contiguous axial images were obtained from the base of the skull through the vertex without intravenous contrast.  COMPARISON:  None.  FINDINGS: There is low-density in the anterior right thalamus. There is no midline shift or hydrocephalus. There is no acute hemorrhage. The bony calvarium is intact. The visualized sinuses are clear.  IMPRESSION: Low-density in the anterior  right thalamus suspicious for acute infarct. Further evaluation with brain MRI is recommended.   Electronically Signed   By: Abelardo Diesel M.D.   On: 02/17/2015 19:06   Ct Chest Wo Contrast  02/14/2015   CLINICAL DATA:  Hemoptysis for 1 day with profound dyspnea and tachycardia. History of hypertension and diabetes. Adult respiratory distress syndrome. Initial encounter.  EXAM: CT CHEST WITHOUT CONTRAST  TECHNIQUE: Multidetector CT imaging of the chest was performed following the standard protocol without IV contrast.  COMPARISON:  Radiographs 02/14/2015 and 01/03/2014.  FINDINGS: Mediastinum/Nodes: There are mildly prominent mediastinal lymph nodes, including a 9 mm right paratracheal node on image number 20 and a 10 mm precarinal node on image 23. Hilar evaluation limited by the lack of intravenous contrast and adjacent airspace opacities. No axillary adenopathy.The patient is intubated. The endotracheal tube terminates in the distal trachea above the carina. The thyroid gland, trachea and esophagus demonstrate no significant findings. The heart appears mildly enlarged. There is no pericardial effusion. Mild coronary artery calcifications are  noted.  Lungs/Pleura: As demonstrated radiographically, there are extensive bilateral perihilar airspace opacities with air bronchograms. There is consolidation dependently in both lower lobes. No endobronchial lesion, parenchymal necrosis or significant pleural effusion identified.  Upper abdomen: The stomach appears mildly distended. Otherwise unremarkable.  Musculoskeletal/Chest wall: There is no chest wall mass or suspicious osseous finding. There is a small amount of gas inferior to the left clavicle from presumed attempted central line placement. No pneumothorax or pneumomediastinum. Mild bilateral gynecomastia noted.  IMPRESSION: 1. Bilateral perihilar infiltrates with air bronchograms and dependent consolidation. Findings may be secondary to pneumonia, ARDS, hemorrhage or a combination thereof. 2. Mildly prominent mediastinal lymph nodes, likely reactive. 3. No significant pleural effusion. 4. Coronary artery calcifications.   Electronically Signed   By: Richardean Sale M.D.   On: 02/14/2015 17:52   Mr Jodene Nam Head Wo Contrast  02/18/2015   CLINICAL DATA:  54 year old diabetic hypertensive male with dizziness and altered mental status. Subsequent encounter.  EXAM: MRI HEAD WITHOUT CONTRAST  MRA HEAD WITHOUT CONTRAST  TECHNIQUE: Multiplanar, multiecho pulse sequences of the brain and surrounding structures were obtained without intravenous contrast. Angiographic images of the head were obtained using MRA technique without contrast.  COMPARISON:  02/17/2015 head CT.  No comparison brain MR.  FINDINGS: MRI HEAD FINDINGS  Exam is motion degraded.  Ill-defined region of altered signal intensity right peri third ventricular region/ anterior lateral margin of the right thalamus may represent an acute/subacute infarct although given the indistinct appearance, follow-up imaging will be necessary to exclude the possibility of this representing result of infection or mass. This causes slight impression upon the right  lateral aspect of the third ventricle.  Focal region of blood breakdown products superior vermis. Question small cavernoma as versus result of hemorrhage from prior infarct or trauma.  Tiny nonspecific tiny cerebral spinal fluid appearing structures adjacent to the left periatrial region may represent result of prior insult, small perivascular spaces or tiny neural glial cyst.  Partial opacification mastoid air cells without obstructing lesion of the eustachian tube noted.  No hydrocephalus.  Prominent mineral deposition globus pallidus incidentally noted.  MRA HEAD FINDINGS  Exam is motion degraded.  Mild to moderate narrowing cavernous segment right internal carotid artery.  Mild narrowing supraclinoid segment internal carotid artery bilaterally.  Focal stenosis of left M2 segment branch.  Left vertebral artery ends in a left posterior inferior cerebellar artery distribution. The left posterior inferior cerebellar artery is narrowed and  irregular.  No high-grade stenosis of the distal right vertebral artery.  Narrowed right posterior inferior cerebellar artery.  Mild narrowing mid aspect of the basilar artery.  Mild narrowing of portions of the anterior inferior cerebellar artery bilaterally.  Moderate narrowing right superior cerebellar artery.  Mild to moderate focal narrowing proximal left posterior cerebral artery.  No aneurysm noted.  IMPRESSION: MRI HEAD  Exam is motion degraded.  Ill-defined region of altered signal intensity right peri third ventricular region/ anterior lateral margin of the right thalamus may represent an acute/subacute infarct although given the indistinct appearance, follow-up imaging will be necessary to exclude the possibility of this representing result of infection or mass. This causes slight impression upon the right lateral aspect of the third ventricle.  Focal region of blood breakdown products superior vermis. Question small cavernoma as versus result of hemorrhage from prior  infarct or trauma.  MRA HEAD  Exam is motion degraded.  Mild to moderate narrowing cavernous segment right internal carotid artery.  Mild narrowing supraclinoid segment internal carotid artery bilaterally.  Focal stenosis of left M2 segment branch.  Left vertebral artery ends in a left posterior inferior cerebellar artery distribution. The left posterior inferior cerebellar artery is narrowed and irregular.  No high-grade stenosis of the distal right vertebral artery.  Narrowed right posterior inferior cerebellar artery.  Mild narrowing mid aspect of the basilar artery.  Mild narrowing of portions of the anterior inferior cerebellar artery bilaterally.  Moderate narrowing right superior cerebellar artery.  Mild to moderate focal narrowing proximal left posterior cerebral artery.   Electronically Signed   By: Genia Del M.D.   On: 02/18/2015 17:59   Mr Brain Wo Contrast  02/18/2015   CLINICAL DATA:  54 year old diabetic hypertensive male with dizziness and altered mental status. Subsequent encounter.  EXAM: MRI HEAD WITHOUT CONTRAST  MRA HEAD WITHOUT CONTRAST  TECHNIQUE: Multiplanar, multiecho pulse sequences of the brain and surrounding structures were obtained without intravenous contrast. Angiographic images of the head were obtained using MRA technique without contrast.  COMPARISON:  02/17/2015 head CT.  No comparison brain MR.  FINDINGS: MRI HEAD FINDINGS  Exam is motion degraded.  Ill-defined region of altered signal intensity right peri third ventricular region/ anterior lateral margin of the right thalamus may represent an acute/subacute infarct although given the indistinct appearance, follow-up imaging will be necessary to exclude the possibility of this representing result of infection or mass. This causes slight impression upon the right lateral aspect of the third ventricle.  Focal region of blood breakdown products superior vermis. Question small cavernoma as versus result of hemorrhage from prior  infarct or trauma.  Tiny nonspecific tiny cerebral spinal fluid appearing structures adjacent to the left periatrial region may represent result of prior insult, small perivascular spaces or tiny neural glial cyst.  Partial opacification mastoid air cells without obstructing lesion of the eustachian tube noted.  No hydrocephalus.  Prominent mineral deposition globus pallidus incidentally noted.  MRA HEAD FINDINGS  Exam is motion degraded.  Mild to moderate narrowing cavernous segment right internal carotid artery.  Mild narrowing supraclinoid segment internal carotid artery bilaterally.  Focal stenosis of left M2 segment branch.  Left vertebral artery ends in a left posterior inferior cerebellar artery distribution. The left posterior inferior cerebellar artery is narrowed and irregular.  No high-grade stenosis of the distal right vertebral artery.  Narrowed right posterior inferior cerebellar artery.  Mild narrowing mid aspect of the basilar artery.  Mild narrowing of portions of the anterior inferior  cerebellar artery bilaterally.  Moderate narrowing right superior cerebellar artery.  Mild to moderate focal narrowing proximal left posterior cerebral artery.  No aneurysm noted.  IMPRESSION: MRI HEAD  Exam is motion degraded.  Ill-defined region of altered signal intensity right peri third ventricular region/ anterior lateral margin of the right thalamus may represent an acute/subacute infarct although given the indistinct appearance, follow-up imaging will be necessary to exclude the possibility of this representing result of infection or mass. This causes slight impression upon the right lateral aspect of the third ventricle.  Focal region of blood breakdown products superior vermis. Question small cavernoma as versus result of hemorrhage from prior infarct or trauma.  MRA HEAD  Exam is motion degraded.  Mild to moderate narrowing cavernous segment right internal carotid artery.  Mild narrowing supraclinoid  segment internal carotid artery bilaterally.  Focal stenosis of left M2 segment branch.  Left vertebral artery ends in a left posterior inferior cerebellar artery distribution. The left posterior inferior cerebellar artery is narrowed and irregular.  No high-grade stenosis of the distal right vertebral artery.  Narrowed right posterior inferior cerebellar artery.  Mild narrowing mid aspect of the basilar artery.  Mild narrowing of portions of the anterior inferior cerebellar artery bilaterally.  Moderate narrowing right superior cerebellar artery.  Mild to moderate focal narrowing proximal left posterior cerebral artery.   Electronically Signed   By: Genia Del M.D.   On: 02/18/2015 17:59   US Renal  02/15/2015   CLINICAL DATA:  Acute kidney injury  EXAM: RENAL / URINARY TRACT ULTRASOUND COMPLETE  COMPARISON:  None.  FINDINGS: Right Kidney:  Length: 12 cm. Borderline echogenic cortex. Mild and nonspecific perinephric edema. No mass or hydronephrosis visualized.  Left Kidney:  Length: 12 cm. Borderline echogenic cortex. 11 mm upper pole cyst with possible thin septation. No solid mass or hydronephrosis visualized.  Bladder:  Not visualized with report of Foley catheter.  Possibility of gallbladder sludge, without visualized wall thickening.  IMPRESSION: No hydronephrosis.   Electronically Signed   By: Monte Fantasia M.D.   On: 02/15/2015 04:12   Dg Chest Port 1 View  02/20/2015   CLINICAL DATA:  Pneumonia, ARDS, acute respiratory failure, CVA.  EXAM: PORTABLE CHEST 1 VIEW  COMPARISON:  Portable chest x-ray of February 18, 2015  FINDINGS: The lungs are adequately inflated. The perihilar interstitial markings are increased but not greatly changed from the previous study. The heart is mildly enlarged. The central pulmonary vascularity is prominent. The right internal jugular venous catheter tip projects over the proximal third of the SVC. There is left perihilar subsegmental atelectasis which is more conspicuous  today.  IMPRESSION: Slight increased left perihilar subsegmental atelectasis. Stable low-grade pulmonary interstitial edema.   Electronically Signed   By: David  Martinique M.D.   On: 02/20/2015 07:48   Dg Chest Port 1 View  02/18/2015   CLINICAL DATA:  Followup edema/infection.  EXAM: PORTABLE CHEST 1 VIEW  COMPARISON:  02/15/2015  FINDINGS: Cardiomegaly again noted.  A right IJ central venous catheter is unchanged with tip overlying the mid - upper SVC.  Bilateral airspace opacities have improved.  No definite pleural effusion or pneumothorax noted.  IMPRESSION: Decreased bilateral airspace disease.  Cardiomegaly.   Electronically Signed   By: Margarette Canada M.D.   On: 02/18/2015 13:33   Dg Chest Port 1 View  02/15/2015   CLINICAL DATA:  Evaluate airway  EXAM: PORTABLE CHEST 1 VIEW  COMPARISON:  02/14/2015  FINDINGS: Support devices are stable.  There is cardiomegaly. Bilateral airspace opacities are noted, right greater than left. No visible effusions. No acute bony abnormality.  IMPRESSION: Continued bilateral airspace disease, right greater than left, not significantly changed. This could represent asymmetric edema or infection.   Electronically Signed   By: Rolm Baptise M.D.   On: 02/15/2015 07:19   Dg Chest Port 1 View  02/14/2015   CLINICAL DATA:  Status post central line placement  EXAM: PORTABLE CHEST 1 VIEW  COMPARISON:  February 14, 2015 10:59 a.m.  FINDINGS: Right jugular central venous line is identified in the superior vena cava. Endotracheal tube is identified with distal tip 5.6 cm from carina. Nasogastric tube is identified distal tip not included but is at least in the stomach. There are airspace opacities throughout both lungs, right greater than left unchanged compared to prior exam. No definite pleural line is identified to suggest pneumothorax. The bony structures stable.  IMPRESSION: Right jugular central venous line distal tip in the superior vena cava. Endotracheal tube in good position.  There is no pneumothorax.   Electronically Signed   By: Abelardo Diesel M.D.   On: 02/14/2015 19:48   Dg Chest Portable 1 View  02/14/2015   CLINICAL DATA:  Labored breathing.  EXAM: PORTABLE CHEST 1 VIEW  COMPARISON:  None.  FINDINGS: The patient has extensive bilateral airspace disease, worse on the right. No pleural effusion or pneumothorax. Heart size is upper normal.  IMPRESSION: Extensive bilateral airspace disease could be due to asymmetric edema, pneumonia or possibly pulmonary hemorrhage.   Electronically Signed   By: Inge Rise M.D.   On: 02/14/2015 16:25   Dg Abd Portable 1v  02/14/2015   CLINICAL DATA:  Encounter for nasogastric tube placement.  EXAM: PORTABLE ABDOMEN - 1 VIEW  COMPARISON:  Chest radiograph at 1938 hr  FINDINGS: Tip and side port of the enteric tube below the diaphragm in the stomach. No dilated bowel loops to suggest obstruction. Dense consolidation in the right lung with paucity of lung markings peripherally, suboptimally assessed on current exam.  IMPRESSION: Tip and side port of the enteric tube below the diaphragm in the stomach.   Electronically Signed   By: Jeb Levering M.D.   On: 02/14/2015 22:28    ECG  Normal sinus rhythm with T-wave inversion in the inferolateral leads  ASSESSMENT AND PLAN  1. Pulmonary hemorrhage: unclear cause  - Unclear cause, patient denies history of DVT, TB or h/o alcoholism (esophageal varices)  2. Acute respiratory failure secondary to #1   - Although chest x-ray was concerning for mild interstitial edema, on physical exam, patient does not have obvious rale, does have mildly decreased breath sound at the bases, however passing air, no JVD or lower extremity edema on exam.  - Will hold off on adding any Lasix at this point.  3. Acute CVA  - continue ASA  4. Elevated troponin  - trop 3 --> 5.5 --> 11.78 --> 8.2, TWI in inferolateral leads  - Initially felt secondary to demand ischemia, trop elevated troponin up to 12,  higher than expected.  - Not a good candidate for invasive therapy given recent hemoptysis, stroke and severe CKD, will obtain Myoview, noted EF has improved, may have a component of stress induced cardiomyopathy initially  5. HTN: Systolic blood pressure [id number] to 190s, currently off home amlodipine, after initial period of permissive hypertension, consider restarting home blood pressure medication.  6. DM II: hgb A1C 8.5, need better control  7. Acute on  chronic renal insufficiency  - Renal function improving, creatinine peaked on 9/29 at 5.9, creatinine 3.86.    SignedAlmyra Deforest, PA-C 02/20/2015, 2:21 PM I have seen and examined the patient along with Almyra Deforest, PA-C.  I have reviewed the chart, notes and new data.  I agree with PA's note.  Key new complaints: breathing better, no pain. Frank hemoptysis, rather than foamy bloody sputum preceded dyspnea at disease onset Key examination changes: no S3, no edema and no JVD; has bilateral basal pulmonary crackles Key new findings / data: reviewed the echo studies, the radiology images and labs. His CT chest does not look like CHF, rather pulmonary hemorrage. There is moderate coronary calcification, especially in the mid LAD. Normal EF and no regional wall motion abnormalities are seen (initial echo did show a slightly depressed LVEF, resolved; may have been BP related). There is LVH and evidence of diastolic dysfunction, but not overwhelming in severity.  1. Bilateral pulmonary (alveolar) hemorrhage was the presenting syndrome and led to respiratory failure. He received both antibiotics and high dose steroids. BP was not severely elevated on arrival. He improved BEFORE net diuresis (only achieved in the last 2 days). Immunology tests for Wegener's, Goodpasture's and lupus all negative, but CRP 10. 2. Acute diastolic CHF occurred as well, but appears to be a secondary event; he now appears compensated/euvolemic 3. NSTEMI with mild troponin  elevation and normal LV function may represent demand ischemia in the setting of severe variations in BP and oxygenation 4. Acute on chronic renal insufficiency may be secondary to hemodynamic changes, improving but not yet at baseline. CKD may be related to HTN and DM, both not well controlled. UA was fairly active with granular casts, a few RBC and mild proteinuria 5. Acute right thalamus stroke, source unclear. No atrial fibrillation documented since admission or at any point in the past.  PLAN:  Complicated and rather perplexing sequence of events. I doubt his initial decompensation was CHF related, although diastolic HF is present. Ideally, he should undergo coronary angiography, but the risk of nephrotoxicity precludes this direct approach. Plan Lexiscan Myoview in AM, with cardiac cath  I agree that he does not require more diuretic right now. After DC< 30 day event monitor for PAF is recommended.  Sanda Klein, MD, [place] [phone] 02/20/2015, 2:26 PM

## 2015-02-20 NOTE — Evaluation (Signed)
Speech Language Pathology Evaluation Patient Details Name: Russell Price MRN: [id number] DOB: 1961/02/16 Today's Date: 02/20/2015 Time: 1030-1050 SLP Time Calculation (min) (ACUTE ONLY): 20 min  Problem List:  Patient Active Problem List   Diagnosis Date Noted  . Cerebral thrombosis with cerebral infarction ([place]) 02/18/2015  . Acute respiratory failure with hypoxemia ([place]) 02/14/2015  . ARDS (adult respiratory distress syndrome) ([place]) 02/14/2015  . Hemoptysis 02/14/2015  . Diffuse pulmonary alveolar hemorrhage   . Essential hypertension 11/11/2014  . DM (diabetes mellitus) with complications ([place]) [id number]  . CKD (chronic kidney disease) stage 4, GFR 15-29 ml/min ([facility]) 11/11/2014  . Blind right eye 11/11/2014  . Noncompliance 11/11/2014   Past Medical History:  Past Medical History  Diagnosis Date  . Diabetes mellitus without complication ([place])     followed by Dr Chalmers Cater  . Hypertension     followed by [facility]   Past Surgical History: History reviewed. No pertinent past surgical history. HPI:  Pt is a 54 y.o. male (Arabic is primary language, reportedly English is "ok"). Admitted 9/27 with hemoptysis, later developing dyspnea and tachycardia and was intubated with copious bloody secretions. Recently also had multiple admissions for dizziness. Head CT on 9/30 was suspicious for acute infarct of anterior R thalamus, MRI was ordered. Initial CXR showed bilateral perihilar infiltrates; CXR 10/1 showed decreased bilateral airspace disease with no definite pleural effusion noted. Bedside swallow eval ordered due to prolonged intubation- extubated 10/1.   Assessment / Plan / Recommendation Clinical Impression  Cognitive/linguistic evaluation was completed.  Language and cognitive skills appeared to be functional.  The patient was oriented x4, had good recall for 3 novel items immediately and with interference, followed 3 step direction,read short sentence and was able  to spontaneously write a short sentence.  He scored a 24/30 on the Mini Mental State Exam.  5 points lost on the attention task.  Issues with this task appeared to be related to language barrier.  Legibility of writing per the patient and wife is different and he was unable to copy a Agricultural consultant.  Acute ST needs are not identified for cognition.      SLP Assessment  Patient does not need any further Speech Lanaguage Pathology Services    Follow Up Recommendations  None       Pertinent Vitals/Pain Pain Assessment: No/denies pain   SLP Goals  Patient/Family Stated Goal: None stated  SLP Evaluation Prior Functioning  Cognitive/Linguistic Baseline: Within functional limits Type of Home: Apartment Available Help at Discharge: Family;Available PRN/intermittently   Cognition  Overall Cognitive Status: Within Functional Limits for tasks assessed Arousal/Alertness: Awake/alert Orientation Level: Oriented X4 Attention: Sustained Sustained Attention: Appears intact Memory: Appears intact    Comprehension  Auditory Comprehension Overall Auditory Comprehension: Appears within functional limits for tasks assessed Commands: Within Functional Limits Conversation: Simple Reading Comprehension Reading Status: Within funtional limits    Expression Expression Primary Mode of Expression: Verbal Verbal Expression Overall Verbal Expression: Appears within functional limits for tasks assessed Initiation: No impairment Automatic Speech: Name;Social Response Level of Generative/Spontaneous Verbalization: Sentence Repetition: No impairment Naming: No impairment Pragmatics: No impairment Non-Verbal Means of Communication: Not applicable Written Expression Dominant Hand: Right Written Expression: Within Functional Limits   Oral / Motor Motor Speech Overall Motor Speech: Appears within functional limits for tasks assessed Respiration: Within functional limits Phonation: Normal Resonance: Within  functional limits Articulation: Within functional limitis Intelligibility: Intelligible Motor Planning: Witnin functional limits Motor Speech Errors: Not applicable   GO  Shelly Flatten, MA, [facility] SLP [phone] [name] 02/20/2015, 11:17 AM

## 2015-02-20 NOTE — Progress Notes (Signed)
STROKE TEAM PROGRESS NOTE   SUBJECTIVE (INTERVAL HISTORY) No complaints   OBJECTIVE Temp:  [98.5 F (36.9 C)-99.3 F (37.4 C)] 98.9 F (37.2 C) (10/03 0506) Pulse Rate:  [57-68] 68 (10/03 0506) Cardiac Rhythm:  [-] Normal sinus rhythm (10/03 0737) Resp:  [18-24] 18 (10/03 0506) BP: (166-195)/(64-78) 177/77 mmHg (10/03 0506) SpO2:  [92 %-100 %] 99 % (10/03 0506) Weight:  [85.2 kg (187 lb 13.3 oz)-85.3 kg (188 lb 0.8 oz)] 85.3 kg (188 lb 0.8 oz) (10/03 0506)  CBC:   Recent Labs Lab 02/19/15 0422 02/20/15 0745  WBC 6.3 7.0  NEUTROABS 4.8 5.7  HGB 9.4* 11.0*  HCT 28.4* 32.1*  MCV 85.0 84.0  PLT 119* 120*    Basic Metabolic Panel:   Recent Labs Lab 02/19/15 0422 02/20/15 0745  NA 145 138  K 3.1* 3.7  CL 108 106  CO2 27 20*  GLUCOSE 143* 300*  BUN 67* 57*  CREATININE 4.27* 3.86*  CALCIUM 7.5* 7.6*  MG 2.3 1.8  PHOS 4.6 4.4    Lipid Panel:     Component Value Date/Time   CHOL 191 02/18/2015 0344   TRIG 290* 02/18/2015 0344   HDL 23* 02/18/2015 0344   CHOLHDL 8.3 02/18/2015 0344   VLDL 58* 02/18/2015 0344   LDLCALC 110* 02/18/2015 0344   HgbA1c:  Lab Results  Component Value Date   HGBA1C 8.7 02/03/2015   Urine Drug Screen: No results found for: LABOPIA, COCAINSCRNUR, LABBENZ, AMPHETMU, THCU, LABBARB    IMAGING  MRI HEAD   02/18/2015    Exam is motion degraded.  Ill-defined region of altered signal intensity right peri third ventricular region/ anterior lateral margin of the right thalamus may represent an acute/subacute infarct although given the indistinct appearance, follow-up imaging will be necessary to exclude the possibility of this representing result of infection or mass. This causes slight impression upon the right lateral aspect of the third ventricle.  Focal region of blood breakdown products superior vermis. Question small cavernoma as versus result of hemorrhage from prior infarct or trauma.    MRA HEAD   02/18/2015    Exam is motion  degraded.  Mild to moderate narrowing cavernous segment right internal carotid artery.  Mild narrowing supraclinoid segment internal carotid artery bilaterally.  Focal stenosis of left M2 segment branch.  Left vertebral artery ends in a left posterior inferior cerebellar artery distribution. The left posterior inferior cerebellar artery is narrowed and irregular.  No high-grade stenosis of the distal right vertebral artery.  Narrowed right posterior inferior cerebellar artery.  Mild narrowing mid aspect of the basilar artery.  Mild narrowing of portions of the anterior inferior cerebellar artery bilaterally.  Moderate narrowing right superior cerebellar artery.  Mild to moderate focal narrowing proximal left posterior cerebral artery.     Dg Chest Port 1 View Slight increased left perihilar subsegmental atelectasis. Stable low-grade pulmonary interstitial edema.     Dg Chest Port 1 View 02/18/2015   Decreased bilateral airspace disease.  Cardiomegaly.      PHYSICAL EXAM  Pleasant African-male who is intubated but not sedated. . Afebrile. Head is nontraumatic. Neck is supple without bruit.    Cardiac exam no murmur or gallop. Lungs are clear to auscultation. Distal pulses are well felt. Neurological Exam : Awake alert  Oriented x 3 . able to follow commands quite well.   Extraocular movements are full range. Slight restriction of upgaze. Pupils irregular but reactive. Vision acuity adequate. Blinks to threat bilaterally. No facial weakness.  Tongue midline. Good cough and gag. Able to move all 4 extremities against gravity but mild weakness of left grip, hand and hip flexors   . Coordination and gait cannot be reliably tested.   ASSESSMENT/PLAN Russell Price is a 54 y.o. male with history of hypertension, diabetes, and chronic kidney disease presenting with hemoptysis, dyspnea, and tachycardia. He did not receive IV t-PA due to unknown time of onset.   Stroke:  Possible non-dominant R thalamic  infarct vs infectious etiology, rule out PFO.  Resultant   Mild left hemiparesis and upgaze restriction  MRI  Ill-defined region of altered signal intensity right peri third ventricular region/ anterior lateral margin of the right thalamus, may represent an acute/subacute infarct  MRA  motion degraded. Mild irregularities without high-grade stenosis  Repeat limited MRI with and without contrast to help determine etiology  Carotid Doppler pending  2D Echo EF 45-50%. No cardiac source of emboli identified.  Bilateral lower extremity duplex - pending  LDL 110  HgbA1c 8.7  VTE prophylaxis - SCDs DIET DYS 3 Room service appropriate?: Yes; Fluid consistency:: Thin  no antithrombotic prior to admission, now on aspirin 325 mg orally every day  Patient counseled to be compliant with his antithrombotic medications  Ongoing aggressive stroke risk factor management  Therapy recommendations: CIR  Disposition: Pending  Hypertension  Stable  Hyperlipidemia  Home meds: No lipid lowering medications prior to admission  LDL 110, goal < 70  Add Lipitor 40 mg daily  Continue statin at discharge  Diabetes  HgbA1c 8.7, goal < 7.0  Uncontrolled  Other Stroke Risk Factors  Cigarette smoker, quit smoking 31 years ago.  Other Active Problems  Anemia  Mild hypokalemia  Hospital day # [facility] for Pager information 02/20/2015 3:46 PM  I have personally examined this patient, reviewed notes, independently viewed imaging studies, participated in medical decision making and plan of care. I have made any additions or clarifications directly to the above note. Agree with note above. Plan to check TCD bubble study for PFO today and MRI scan of the brain with contrast  Antony Contras, [place] Pager: [phone] 02/20/2015 3:57 PM    To contact Stroke Continuity provider, please refer to  [email or web address] After hours, contact General Neurology

## 2015-02-20 NOTE — Progress Notes (Signed)
Speech Language Pathology Treatment: Dysphagia  Patient Details Name: Russell Price MRN: [id number] DOB: [birth year] Today's Date: 02/20/2015 Time: 1050-1108 SLP Time Calculation (min) (ACUTE ONLY): 18 min  Assessment / Plan / Recommendation Clinical Impression  ST follow up for therapeutic diet tolerance.  Per nursing the patient has been doing well with his diet.  Charting indicated that lungs have been clear and intake has been fair.  The patient has had 2 low grade temps since initial BSE and the current chest x-ray is showing increased left perihilar atelectasis..  Meal observation was completed and the patient did not present with any overt s/s of aspiration given regular textured foods and thin liquids.  Impulsivity with intake was not noted today.  Recommend advance diet to regular foods and thin liquids.  ST will follow up for therapeutic diet tolerance.     HPI Other Pertinent Information: Pt is a 54 y.o. male (Arabic is primary language, reportedly English is "ok"). Admitted 9/27 with hemoptysis, later developing dyspnea and tachycardia and was intubated with copious bloody secretions. Recently also had multiple admissions for dizziness. Head CT on 9/30 was suspicious for acute infarct of anterior R thalamus, MRI was ordered. Initial CXR showed bilateral perihilar infiltrates; CXR 10/1 showed decreased bilateral airspace disease with no definite pleural effusion noted. Bedside swallow eval ordered due to prolonged intubation- extubated 10/1.   Pertinent Vitals Pain Assessment: No/denies pain  SLP Plan       Recommendations Diet recommendations: Regular;Thin liquid Liquids provided via: Cup Medication Administration: Whole meds with liquid Supervision: Patient able to self feed Compensations: Slow rate;Small sips/bites Postural Changes and/or Swallow Maneuvers: Seated upright 90 degrees              Oral Care Recommendations: Oral care BID Follow up Recommendations:  (to be  determined)    [place], MA, CCC-SLP Acute Rehab SLP [phone] Shelly Flatten N 02/20/2015, 11:20 AM

## 2015-02-20 NOTE — Progress Notes (Signed)
*  PRELIMINARY RESULTS* Vascular Ultrasound Transcranial Doppler with Bubbles has been completed.   Procedure was performed by Dr.Sethi. There was no obvious evidence of high intensity transient signals at rest or with valsalva, therefore there is no evidence of PFO.  02/20/2015 4:54 PM Maudry Mayhew, RVT, RDCS, RDMS

## 2015-02-20 NOTE — Progress Notes (Addendum)
Nutrition Follow-up  DOCUMENTATION CODES:   Not applicable  INTERVENTION:   -Continue regular diet with thin liquids  NUTRITION DIAGNOSIS:   Inadequate oral intake related to inability to eat as evidenced by NPO status.  Resolved  GOAL:   Patient will meet greater than or equal to 90% of their needs  Met  MONITOR:   Vent status, Labs, Weight trends, I & O's  REASON FOR ASSESSMENT:   Ventilator    ASSESSMENT:   54 year old [place] male with no cardiac history presented with SOB and hemoptysis. CXR showed R>L edema. He was intubated in ED for airway protection in setting of copious bloody secretions.   Pt was extubated on 02/18/15.   Pt s/p SLP evaluation on 02/18/15; pt was advanced to a dysphagia 3 diet with thin liquids, due to impulsivity.   Spoke with RN, who reports pt is making good progress. She reports that SLP saw him this AM and diet has been upgraded to regular consistency. Nuerology is following for potential rt thalamus infarct; RN reports deficit with rt arm.  Spoke with pt and pt wife at bedside. He reports feeling well and his appetite is great. He denies any issues with his swallow function. He consumed 100% of his breakfast.  Discussed importance of continued good PO intake to support healing. Pt and wife report no further nutrition concerns at this time, but expressed appreciation for visit.   Labs reviewed.  Diet Order:  Diet regular Room service appropriate?: Yes; Fluid consistency:: Thin  Skin:  Reviewed, no issues  Last BM:  02/19/15  Height:   Ht Readings from Last 1 Encounters:  02/19/15 '5\' 11"'  (1.803 m)    Weight:   Wt Readings from Last 1 Encounters:  02/20/15 188 lb 0.8 oz (85.3 kg)    Ideal Body Weight:  75.5 kg  BMI:  Body mass index is 26.24 kg/(m^2).  Estimated Nutritional Needs:   Kcal:  1800-2000  Protein:  90-105 grams  Fluid:  1.8-2.0 L  EDUCATION NEEDS:   No education needs identified at this  time  Keah Lamba A. Jimmye Norman, RD, LDN, CDE Pager: [phone] After hours Pager: [phone]

## 2015-02-20 NOTE — Progress Notes (Signed)
Physical Therapy Treatment Patient Details Name: Russell Price MRN: [id number] DOB: 1961/04/22 Today's Date: 02/20/2015    History of Present Illness Pt is a 54 y.o. male (Arabic is primary language). Admitted 9/27 with hemoptysis, later developing dyspnea and tachycardia and was intubated with copious bloody secretions. Recently also had multiple admissions for dizziness. Head CT on 9/30 was suspicious for acute infarct of anterior R thalamus, MRI was motion degraded but also suspicious for acute/ subacute right thalamic infarct. Initial CXR showed bilateral perihilar infiltrates; CXR 10/1 showed decreased bilateral airspace disease with no definite pleural effusion noted. Pt extubated 10/1.     PT Comments    Pt making good progress.  Follow Up Recommendations  CIR     Equipment Recommendations  Other (comment) (TBD)    Recommendations for Other Services Rehab consult;OT consult     Precautions / Restrictions Precautions Precautions: Fall Restrictions Weight Bearing Restrictions: No    Mobility  Bed Mobility Overal bed mobility: Needs Assistance Bed Mobility: Supine to Sit     Supine to sit: Mod assist     General bed mobility comments: Assist to bring trunk up.  Transfers Overall transfer level: Needs assistance Equipment used: Rolling walker (2 wheeled) Transfers: Sit to/from Stand Sit to Stand: Mod assist         General transfer comment: Verbal cues for hand placement. Assist to bring hips up.  Ambulation/Gait Ambulation/Gait assistance: Mod assist;Min assist Ambulation Distance (Feet): 125 Feet Assistive device: Rolling walker (2 wheeled) Gait Pattern/deviations: Step-through pattern;Decreased step length - left;Decreased dorsiflexion - left;Trunk flexed Gait velocity: decr Gait velocity interpretation: Below normal speed for age/gender General Gait Details: Initially mod A for balance and LLE placement but then progressed to min A. Assist to keep lt  hand on walker.  Verbal cues to stay closer to walker.   Stairs            Wheelchair Mobility    Modified Rankin (Stroke Patients Only) Modified Rankin (Stroke Patients Only) Pre-Morbid Rankin Score: No symptoms Modified Rankin: Moderately severe disability     Balance Overall balance assessment: Needs assistance Sitting-balance support: Single extremity supported;Feet supported Sitting balance-Leahy Scale: Poor Sitting balance - Comments: Initially slight rt lean. Postural control: Right lateral lean Standing balance support: Bilateral upper extremity supported Standing balance-Leahy Scale: Poor Standing balance comment: Walker and min A for static standing.                    Cognition Arousal/Alertness: Awake/alert Behavior During Therapy: WFL for tasks assessed/performed Overall Cognitive Status: Impaired/Different from baseline Area of Impairment: Problem solving;Safety/judgement         Safety/Judgement: Decreased awareness of deficits   Problem Solving: Slow processing;Requires verbal cues;Requires tactile cues General Comments: decreased attention to left side, decreased awareness of deficits at this point    Exercises      General Comments        Pertinent Vitals/Pain Pain Assessment: No/denies pain    Home Living                      Prior Function            PT Goals (current goals can now be found in the care plan section) Acute Rehab PT Goals Patient Stated Goal: return to home and work PT Goal Formulation: With patient/family Time For Goal Achievement: 03/05/15 Potential to Achieve Goals: Good Progress towards PT goals: Progressing toward goals    Frequency  Min  4X/week    PT Plan Current plan remains appropriate    Co-evaluation             End of Session   Activity Tolerance: Patient tolerated treatment well Patient left: in chair;with call bell/phone within reach;with family/visitor present      Time: [id number] PT Time Calculation (min) (ACUTE ONLY): 14 min  Charges:  $Gait Training: 8-22 mins                    G Codes:      Myrtha Tonkovich February 26, 2015, 3:41 PM [employer] PT [phone]

## 2015-02-20 NOTE — Consult Note (Signed)
Physical Medicine and Rehabilitation Consult Reason for Consult: Low density anterior right thalamus suspicious for acute infarct/multi-medical Referring Physician: [facility]   HPI: Russell Price is a 54 y.o. right hand [place] male with history of diabetes mellitus peripheral neuropathy and hypertension as well as chronic renal insufficiency with baseline creatinine 4.28. Patient lives with spouse independent prior to admission in an apartment on the 1st floor. He presented 02/14/2015  with shortness of breath, tachycardia and hemoptysis and transferred to [facility] for ongoing evaluation. Chest x-ray right greater than left edema requiring intubation for airway protection in setting of copious bloody secretions. Patient has recently been seen numerous times for dizziness as well as hypertension. CT of the chest showed bilateral perihilar infiltrates consistent with pneumonia/ARDS. Renal ultrasound with no hydrocephalus. Nephrology consult at 4 acute renal versus chronic renal insufficiency felt likely secondary to ischemic ATN from possible sepsis. Conservative care renal function. Placed on broad-spectrum antibodies. Respiratory cultures with rare GPC in pairs. Patient extubated 02/18/2015. CT of the head 02/17/2015 due to altered mental status showed low density in the anterior right thalamus suspicious for acute infarct. MRA of the head with mild to moderate focal narrowing proximal left posterior cerebral artery. Neurology consulted presently maintained on aspirin therapy. Mechanical soft diet with thin liquids. Physical therapy evaluation completed 02/19/2015 with recommendations of physical medicine rehabilitation consult   Review of Systems  Constitutional: Negative for fever and chills.  HENT: Negative for hearing loss.   Eyes: Negative for blurred vision and double vision.  Respiratory: Positive for cough.        Occasional shortness of breath on exertion  Cardiovascular: Negative  for chest pain, palpitations and leg swelling.  Gastrointestinal: Positive for constipation. Negative for nausea, vomiting and abdominal pain.  Genitourinary: Negative for dysuria and hematuria.  Musculoskeletal: Negative for myalgias and joint pain.  Skin: Negative for rash.  Neurological: Positive for weakness. Negative for seizures, loss of consciousness and headaches.  All other systems reviewed and are negative.  Past Medical History  Diagnosis Date  . Diabetes mellitus without complication ([place])     followed by Dr Chalmers Cater  . Hypertension     followed by [facility]  . Chronic kidney disease    History reviewed. No pertinent past surgical history. Family History  Problem Relation Age of Onset  . Diabetes Mother    Social History:  reports that he quit smoking about 32 years ago. He does not have any smokeless tobacco history on file. He reports that he does not drink alcohol or use illicit drugs. Allergies: No Known Allergies Medications Prior to Admission  Medication Sig Dispense Refill  . amLODipine (NORVASC) 10 MG tablet Take 1 tablet (10 mg total) by mouth daily. Stop lisinopril . 30 tablet 5  . glipiZIDE (GLUCOTROL) 5 MG tablet Take 2 pills in the morning and one in the evening for diabetes 90 tablet 2  . Insulin Glargine (LANTUS SOLOSTAR) 100 UNIT/ML Solostar Pen Inject 50 units subcutaneously at bedtime. 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 Functional Status:  Mobility: Bed  Mobility Overal bed  mobility: Needs Assistance Bed Mobility: Supine to Sit Supine to sit: Mod assist General bed mobility comments: mod A to roll to left and elevate trunk to sitting, mod A to slide hips to EOB, off balance with initial sitting but improved with time Transfers Overall transfer level: Needs assistance Equipment used: Rolling walker (2 wheeled) Transfers: Sit to/from Stand Sit to Stand: Mod assist, Max assist General transfer comment: mod A to steady, vc's for hand placement, assist needed to properly place left foot before attempting stand, left knee with partial buckling initially Ambulation/Gait Ambulation/Gait assistance: Mod assist Ambulation Distance (Feet): 30 Feet Assistive device: Rolling walker (2 wheeled) Gait Pattern/deviations: Step-to pattern, Decreased weight shift to left, Decreased dorsiflexion - left, Decreased stance time - left, Decreased step length - left, Trunk flexed General Gait Details: dragging left toes, can lift foot when cued, occasional buckling of left knee. vc's for stay close to RW and cued each time for stepping of LLE.  Gait velocity: very slow Gait velocity interpretation: <1.8 ft/sec, indicative of risk for recurrent falls    ADL: ADL Overall ADL's : Needs assistance/impaired Eating/Feeding: Set up, Minimal assistance Eating/Feeding Details (indicate cue type and reason): ASsist opening packages, set up Grooming: Wash/dry hands, Wash/dry face, Set up, Minimal assistance, Bed level, Cueing for safety, Cueing for sequencing Upper Body Bathing: Moderate assistance, Bed level, Cueing for safety, Cueing for sequencing (Simulated - pt prefers male OT for ADL's if possible) Lower Body Bathing: Maximal assistance, Bed level (See note above - prefers male OT/Aide) Upper Body Dressing : Maximal assistance, Sitting, Cueing for safety, Cueing for sequencing Upper Body Dressing Details (indicate cue type and reason): pt with lateral lean in  sitting requires mod-max assist for sitting balance Lower Body Dressing: Maximal assistance, Sit to/from stand, Cueing for safety, Cueing for sequencing Toilet Transfer: Maximal assistance, Stand-pivot, Cueing for sequencing, Cueing for safety (Simulated sit to stand and SPT EOB and back to bed) Toilet Transfer Details (indicate cue type and reason): VC's for LUE/LLE and positioning; inattention left side noted Toileting- Clothing Manipulation and Hygiene: Maximal assistance, Sit to/from stand, Cueing for safety, Cueing for sequencing (Simulated) Tub/ Banker:  (TBA) Tub/Shower Transfer Details (indicate cue type and reason): TBA, anticipate +2 assist for safety w/ tub transfer bench Functional mobility during ADLs: Maximal assistance, Rolling walker, Cueing for safety, Cueing for sequencing (Pt took 2 side steps at EOB w/ max assist, vc's  ) General ADL Comments: Pt presents with significant deficits in ADL's and self care tasks as well as functional mobility. [facility] followed by in-pt Rehab if able to assit in maximizing independence with all ADL's and highest level of function. Pt/family were educated in Role of OT and goals. Pt was LUE was also elevated on pillow and family/pt were educated in positioning and performeance of AROM (fisting, el;bow flexion as able) throughout day. Pt/family verbalized understanding of this.  Cognition: Cognition Overall Cognitive Status: Within Functional Limits for tasks assessed Arousal/Alertness: Awake/alert Orientation Level: Oriented X4 Attention: Sustained Sustained Attention: Appears intact Memory: Appears intact Cognition Arousal/Alertness: Awake/alert Behavior During Therapy: WFL for tasks assessed/performed Overall Cognitive Status: Within Functional Limits for tasks assessed Area of Impairment: Problem solving, Safety/judgement Safety/Judgement: Decreased awareness of deficits Problem Solving: Slow processing,  Requires verbal cues, Requires tactile cues General Comments: decreased attention to left side, decreased awareness of deficits at this point  Blood pressure 172/68, pulse 76, temperature 99.1 F (37.3 C), temperature source Oral, resp. rate 20, height  5\' 11"  (1.803 m), weight 85.3 kg (188 lb 0.8 oz), SpO2 100 %. Physical Exam  Vitals reviewed. Constitutional: He appears well-developed and well-nourished.  HENT:  Head: Normocephalic and atraumatic.  Eyes: Conjunctivae and EOM are normal.  Neck: Normal range of motion. Neck supple. No thyromegaly present.  Cardiovascular: Normal rate and regular rhythm.   Respiratory: Effort normal and breath sounds normal. No respiratory distress.  GI: Soft. Bowel sounds are normal. He exhibits no distension.  Musculoskeletal: He exhibits edema (LUE). He exhibits no tenderness.  PROM WNL LLE 4+/5,  RLE, RUE 5/5 LUE: 3/5 abduction, elbow flexion, ext, wrist ext; 2/5 finger grip  Neurological: He is alert.  Alert flat affect. His English is fair. Follow simple commands Sensation intact to light touch CN II-XII grossly intact  Skin: Skin is warm and dry.  Psychiatric: He has a normal mood and affect. His behavior is normal.    Results for orders placed or performed during the hospital encounter of 02/14/15 (from the past 24 hour(s))  Glucose, capillary     Status: Abnormal   Collection Time: 02/19/15  4:42 PM  Result Value Ref Range   Glucose-Capillary 286 (H) 65 - 99 mg/dL  Glucose, capillary     Status: Abnormal   Collection Time: 02/19/15  7:40 PM  Result Value Ref Range   Glucose-Capillary 240 (H) 65 - 99 mg/dL   Comment 1 Notify RN   Glucose, capillary     Status: Abnormal   Collection Time: 02/19/15 10:30 PM  Result Value Ref Range   Glucose-Capillary 226 (H) 65 - 99 mg/dL   Comment 1 Notify RN    Comment 2 Document in Chart   Renal function panel     Status: Abnormal   Collection Time: 02/20/15  7:45 AM  Result Value Ref Range    Sodium 138 135 - 145 mmol/L   Potassium 3.7 3.5 - 5.1 mmol/L   Chloride 106 101 - 111 mmol/L   CO2 20 (L) 22 - 32 mmol/L   Glucose, Bld 300 (H) 65 - 99 mg/dL   BUN 57 (H) 6 - 20 mg/dL   Creatinine, Ser 3.86 (H) 0.61 - 1.24 mg/dL   Calcium 7.6 (L) 8.9 - 10.3 mg/dL   Phosphorus 4.4 2.5 - 4.6 mg/dL   Albumin 2.4 (L) 3.5 - 5.0 g/dL   GFR calc non Af Amer 16 (L) >60 mL/min   GFR calc Af Amer 19 (L) >60 mL/min   Anion gap 12 5 - 15  CBC with Differential/Platelet     Status: Abnormal   Collection Time: 02/20/15  7:45 AM  Result Value Ref Range   WBC 7.0 4.0 - 10.5 K/uL   RBC 3.82 (L) 4.22 - 5.81 MIL/uL   Hemoglobin 11.0 (L) 13.0 - 17.0 g/dL   HCT 32.1 (L) 39.0 - 52.0 %   MCV 84.0 78.0 - 100.0 fL   MCH 28.8 26.0 - 34.0 pg   MCHC 34.3 30.0 - 36.0 g/dL   RDW 12.8 11.5 - 15.5 %   Platelets 120 (L) 150 - 400 K/uL   Neutrophils Relative % 82 %   Neutro Abs 5.7 1.7 - 7.7 K/uL   Lymphocytes Relative 10 %   Lymphs Abs 0.7 0.7 - 4.0 K/uL   Monocytes Relative 6 %   Monocytes Absolute 0.4 0.1 - 1.0 K/uL   Eosinophils Relative 2 %   Eosinophils Absolute 0.2 0.0 - 0.7 K/uL   Basophils Relative 0 %   Basophils Absolute  0.0 0.0 - 0.1 K/uL  Magnesium     Status: None   Collection Time: 02/20/15  7:45 AM  Result Value Ref Range   Magnesium 1.8 1.7 - 2.4 mg/dL  Protime-INR     Status: None   Collection Time: 02/20/15  7:45 AM  Result Value Ref Range   Prothrombin Time 14.6 11.6 - 15.2 seconds   INR 1.12 0.00 - 1.49  APTT     Status: None   Collection Time: 02/20/15  7:45 AM  Result Value Ref Range   aPTT 26 24 - 37 seconds  Glucose, capillary     Status: Abnormal   Collection Time: 02/20/15  9:09 AM  Result Value Ref Range   Glucose-Capillary 333 (H) 65 - 99 mg/dL  Glucose, capillary     Status: Abnormal   Collection Time: 02/20/15 11:45 AM  Result Value Ref Range   Glucose-Capillary 307 (H) 65 - 99 mg/dL   Mr Virgel Paling Wo Contrast  02/18/2015   CLINICAL DATA:  54 year old diabetic  hypertensive male with dizziness and altered mental status. Subsequent encounter.  EXAM: MRI HEAD WITHOUT CONTRAST  MRA HEAD WITHOUT CONTRAST  TECHNIQUE: Multiplanar, multiecho pulse sequences of the brain and surrounding structures were obtained without intravenous contrast. Angiographic images of the head were obtained using MRA technique without contrast.  COMPARISON:  02/17/2015 head CT.  No comparison brain MR.  FINDINGS: MRI HEAD FINDINGS  Exam is motion degraded.  Ill-defined region of altered signal intensity right peri third ventricular region/ anterior lateral margin of the right thalamus may represent an acute/subacute infarct although given the indistinct appearance, follow-up imaging will be necessary to exclude the possibility of this representing result of infection or mass. This causes slight impression upon the right lateral aspect of the third ventricle.  Focal region of blood breakdown products superior vermis. Question small cavernoma as versus result of hemorrhage from prior infarct or trauma.  Tiny nonspecific tiny cerebral spinal fluid appearing structures adjacent to the left periatrial region may represent result of prior insult, small perivascular spaces or tiny neural glial cyst.  Partial opacification mastoid air cells without obstructing lesion of the eustachian tube noted.  No hydrocephalus.  Prominent mineral deposition globus pallidus incidentally noted.  MRA HEAD FINDINGS  Exam is motion degraded.  Mild to moderate narrowing cavernous segment right internal carotid artery.  Mild narrowing supraclinoid segment internal carotid artery bilaterally.  Focal stenosis of left M2 segment branch.  Left vertebral artery ends in a left posterior inferior cerebellar artery distribution. The left posterior inferior cerebellar artery is narrowed and irregular.  No high-grade stenosis of the distal right vertebral artery.  Narrowed right posterior inferior cerebellar artery.  Mild narrowing mid  aspect of the basilar artery.  Mild narrowing of portions of the anterior inferior cerebellar artery bilaterally.  Moderate narrowing right superior cerebellar artery.  Mild to moderate focal narrowing proximal left posterior cerebral artery.  No aneurysm noted.  IMPRESSION: MRI HEAD  Exam is motion degraded.  Ill-defined region of altered signal intensity right peri third ventricular region/ anterior lateral margin of the right thalamus may represent an acute/subacute infarct although given the indistinct appearance, follow-up imaging will be necessary to exclude the possibility of this representing result of infection or mass. This causes slight impression upon the right lateral aspect of the third ventricle.  Focal region of blood breakdown products superior vermis. Question small cavernoma as versus result of hemorrhage from prior infarct or trauma.  MRA HEAD  Exam  is motion degraded.  Mild to moderate narrowing cavernous segment right internal carotid artery.  Mild narrowing supraclinoid segment internal carotid artery bilaterally.  Focal stenosis of left M2 segment branch.  Left vertebral artery ends in a left posterior inferior cerebellar artery distribution. The left posterior inferior cerebellar artery is narrowed and irregular.  No high-grade stenosis of the distal right vertebral artery.  Narrowed right posterior inferior cerebellar artery.  Mild narrowing mid aspect of the basilar artery.  Mild narrowing of portions of the anterior inferior cerebellar artery bilaterally.  Moderate narrowing right superior cerebellar artery.  Mild to moderate focal narrowing proximal left posterior cerebral artery.   Electronically Signed   By: Genia Del M.D.   On: 02/18/2015 17:59   Mr Brain Wo Contrast  02/18/2015   CLINICAL DATA:  54 year old diabetic hypertensive male with dizziness and altered mental status. Subsequent encounter.  EXAM: MRI HEAD WITHOUT CONTRAST  MRA HEAD WITHOUT CONTRAST  TECHNIQUE:  Multiplanar, multiecho pulse sequences of the brain and surrounding structures were obtained without intravenous contrast. Angiographic images of the head were obtained using MRA technique without contrast.  COMPARISON:  02/17/2015 head CT.  No comparison brain MR.  FINDINGS: MRI HEAD FINDINGS  Exam is motion degraded.  Ill-defined region of altered signal intensity right peri third ventricular region/ anterior lateral margin of the right thalamus may represent an acute/subacute infarct although given the indistinct appearance, follow-up imaging will be necessary to exclude the possibility of this representing result of infection or mass. This causes slight impression upon the right lateral aspect of the third ventricle.  Focal region of blood breakdown products superior vermis. Question small cavernoma as versus result of hemorrhage from prior infarct or trauma.  Tiny nonspecific tiny cerebral spinal fluid appearing structures adjacent to the left periatrial region may represent result of prior insult, small perivascular spaces or tiny neural glial cyst.  Partial opacification mastoid air cells without obstructing lesion of the eustachian tube noted.  No hydrocephalus.  Prominent mineral deposition globus pallidus incidentally noted.  MRA HEAD FINDINGS  Exam is motion degraded.  Mild to moderate narrowing cavernous segment right internal carotid artery.  Mild narrowing supraclinoid segment internal carotid artery bilaterally.  Focal stenosis of left M2 segment branch.  Left vertebral artery ends in a left posterior inferior cerebellar artery distribution. The left posterior inferior cerebellar artery is narrowed and irregular.  No high-grade stenosis of the distal right vertebral artery.  Narrowed right posterior inferior cerebellar artery.  Mild narrowing mid aspect of the basilar artery.  Mild narrowing of portions of the anterior inferior cerebellar artery bilaterally.  Moderate narrowing right superior  cerebellar artery.  Mild to moderate focal narrowing proximal left posterior cerebral artery.  No aneurysm noted.  IMPRESSION: MRI HEAD  Exam is motion degraded.  Ill-defined region of altered signal intensity right peri third ventricular region/ anterior lateral margin of the right thalamus may represent an acute/subacute infarct although given the indistinct appearance, follow-up imaging will be necessary to exclude the possibility of this representing result of infection or mass. This causes slight impression upon the right lateral aspect of the third ventricle.  Focal region of blood breakdown products superior vermis. Question small cavernoma as versus result of hemorrhage from prior infarct or trauma.  MRA HEAD  Exam is motion degraded.  Mild to moderate narrowing cavernous segment right internal carotid artery.  Mild narrowing supraclinoid segment internal carotid artery bilaterally.  Focal stenosis of left M2 segment branch.  Left vertebral artery ends  in a left posterior inferior cerebellar artery distribution. The left posterior inferior cerebellar artery is narrowed and irregular.  No high-grade stenosis of the distal right vertebral artery.  Narrowed right posterior inferior cerebellar artery.  Mild narrowing mid aspect of the basilar artery.  Mild narrowing of portions of the anterior inferior cerebellar artery bilaterally.  Moderate narrowing right superior cerebellar artery.  Mild to moderate focal narrowing proximal left posterior cerebral artery.   Electronically Signed   By: Genia Del M.D.   On: 02/18/2015 17:59   Dg Chest Port 1 View  02/20/2015   CLINICAL DATA:  Pneumonia, ARDS, acute respiratory failure, CVA.  EXAM: PORTABLE CHEST 1 VIEW  COMPARISON:  Portable chest x-ray of February 18, 2015  FINDINGS: The lungs are adequately inflated. The perihilar interstitial markings are increased but not greatly changed from the previous study. The heart is mildly enlarged. The central pulmonary  vascularity is prominent. The right internal jugular venous catheter tip projects over the proximal third of the SVC. There is left perihilar subsegmental atelectasis which is more conspicuous today.  IMPRESSION: Slight increased left perihilar subsegmental atelectasis. Stable low-grade pulmonary interstitial edema.   Electronically Signed   By: David  Martinique M.D.   On: 02/20/2015 07:48    Assessment/Plan: Diagnosis: Right CVA 1. Does the need for close, 24 hr/day medical supervision in concert with the patient's rehab needs make it unreasonable for this patient to be served in a less intensive setting? Yes 2. Co-Morbidities requiring supervision/potential complications: Recent ARDS, HTN, DM, CKD, monocular vision loss 3. Due to safety, disease management, medication administration and patient education, does the patient require 24 hr/day rehab nursing? Yes 4. Does the patient require coordinated care of a physician, rehab nurse, PT (1-2 hrs/day, 5 days/week), OT (1-2 hrs/day, 5 days/week) and SLP (1-2 hrs/day, 5 days/week) to address physical and functional deficits in the context of the above medical diagnosis(es)? Yes Addressing deficits in the following areas: balance, endurance, locomotion, strength, transferring, bathing, dressing, grooming, toileting and psychosocial support 5. Can the patient actively participate in an intensive therapy program of at least 3 hrs of therapy per day at least 5 days per week? Yes 6. The potential for patient to make measurable gains while on inpatient rehab is good 7. Anticipated functional outcomes upon discharge from inpatient rehab are supervision and min assist  with PT, modified independent and supervision with OT, independent with SLP. 8. Estimated rehab length of stay to reach the above functional goals is: 12-15 days. 9. Does the patient have adequate social supports and living environment to accommodate these discharge functional goals?  Yes 10. Anticipated D/C setting: Home 11. Anticipated post D/C treatments: HH therapy and Home excercise program 12. Overall Rehab/Functional Prognosis: excellent  RECOMMENDATIONS: This patient's condition is appropriate for continued rehabilitative care in the following setting: CIR Patient has agreed to participate in recommended program. Yes Note that insurance prior authorization may be required for reimbursement for recommended care.  Comment: Rehab Admissions Coordinator to follow up.     02/20/2015

## 2015-02-20 NOTE — Progress Notes (Signed)
Following for IP Rehab needs.  Note pt. To have cardiac cath tomorrow.    [facility] Admissions Coordinator Cell [phone] Office [phone]

## 2015-02-20 NOTE — Discharge Summary (Signed)
[facility] Discharge Summary  Patient name: Russell Price record number: [id number] Date of birth: 09-Jun-1960 Age: 54 y.o. Gender: male Date of Admission: 02/14/2015  Date of Discharge: 02/22/2015 Admitting Physician: Rush Farmer, MD  Primary Care Provider: No PCP Per Patient Consultants: CCM, cardiology, nephrology, neurology  Indication for Hospitalization:   Discharge Diagnoses/Problem List:  Patient Active Problem List   Diagnosis Date Noted  . NSTEMI (non-ST elevated myocardial infarction) ([place]) 02/22/2015  . Abnormal nuclear cardiac imaging test 02/22/2015  . LV dysfunction   . Acute kidney injury ([place])   . Cerebral thrombosis with cerebral infarction ([place]) 02/18/2015  . Acute respiratory failure with hypoxemia ([place]) 02/14/2015  . ARDS (adult respiratory distress syndrome) ([place]) 02/14/2015  . Hemoptysis 02/14/2015  . Diffuse pulmonary alveolar hemorrhage   . Essential hypertension 11/11/2014  . DM (diabetes mellitus) with complications ([place]) [id number]  . CKD (chronic kidney disease) stage 4, GFR 15-29 ml/min ([facility]) 11/11/2014  . Blind right eye 11/11/2014  . Noncompliance 11/11/2014    Disposition: home  Discharge Condition: stable  Discharge Exam: Gen: NAD, well-appearing  Eyes: cataract in right eye, no conjunctival injection. Nares: clear, no erythema, swelling or congestion Oropharynx: clear, moist CV: RRR. S1 & S2 audible, no murmurs, rubs. 2+ radial and DP pulses bilaterally. Resp: no apparent WOB, good aeration bilaterally, CTAB. Abd: +BS. Soft, NDNT, no rebound or guarding.  Ext: No edema or gross deformities. Neuro: Alert and oriented, left orbital muscle weakness, motor 4/5 in LE's, 5/5 in RE's. Sensation intact.   Brief Hospital Course   Acute hypoxic respiratory failure - resolved. SOB and hemoptysis on presentation. CXR showed R>L edema. He was intubated in ED for airway protection in setting of copious  bloody secretions. Admitted to ICU and put  On full ventilator support. Central line placed.  He had extensive work up for diffuse alveolar hemorrhage - (ANA, RF, CCP, GBM, ANCA,MPO, & PR3) all which were negative. Viral resp panel, urine strep, urine legionella, both sets of BAL, AFB smears and fungal cultures were negative. Had leukocytosis that has resolved. Emperically treated for pneumonia with vanc and zosyn for 7 days. Respiratory status improved over the course. Extubated on 10/01 and transferred to floor on 10/03 add stayed stable on room air until the day of discharge.  CVA: infarct in anterior right thalamus on CT head. Neurology involved. MRI with ill-defined region in right thalamus. Unclear if this was acute or subacute Infarct . MRA w/ mild to moderate narrowing with no high grade stenosis. Evaluated by PT/OT/SLP/CIR. [facility]. However, he showed tremendous improvement with his ambulation and ADLs when evaluated by PT/OT, who recommend return home with family and OPPT vs. CIR. Outpatient PT ordered on discharge. Social work and case management to help with connection/referral. Patient will follow up with [facility]. He will also have another MRI in two months.  Concern for CHF: Echo on 9/28 with mild hypokinesis, EF 45-50%. However, patient didn't have obvious rale  no JVD or lower extremity edema on exam. Cardiology consulted. Repeat Echo on 10/03 with EF of 55-60%, no PFO.   Concern for CAD with elevated troponin: trop 3 --> 5.5 --> 11.78 --> 8.2, TWI in inferolateral leads. Initially felt secondary to demand ischemia. Cardiology consulted and obtained LexiScan Myoview. Finding consistent with low risk, inferior wall ischemia. Patient not a good candidate for cath at this time due to elevated creat, recent CVA and  pulmonary hemorrhage. Continue medical therapy targeting risk reduction recommended. Accordingly he was  started on ASA (no plavix), high intensity statin and beta blocker.  Hypertensive emergency- resolved. Markedly elevated troponin to 11.78. On hydralazine PRN.   AoCKD with AGMA - serum creatinine 4.28 on admission (baseline about 2.5). His creatinine increased to 5.9. Nephrology involved and thought this to be secondary to ischemic ATN from sepsis. Creatinine trended dow to 3.5 prior to discharge. UOP also improved. improved as well. Anion Gap metabolic Acidosis - resolved as well.  Hyperkalemia - resolved. K 3.7 on discharge  Normocytic Anemia - Hgb at baseline 11. Hgb 11 today. Likely anemia of chronic disease secondary to CKD.  H/O DM-2: A1c 8.5 down from 10.3 three months ago. On Lantus 50 units HS and glipizide 10 mg in AM and 5 mg in PM at home. Had Hypoglycemic evens on 10/1 that has resolved. CBG in 200's and 300's on SSI. Elevated glucose likely from steroid and ICU stress. Restarted his home insulin and glipizide on discharge.  Subjective: Saw patient this morning. He was lying in the bed with wife sitting at bedside. He reports sleeping well. Denies pain, SOB, or chest pain.  Issues for Follow Up:  1. Respiratory status: significantly improved on discharge. Assess on follow up. 2. CVA: patient will up at [facility] and get MRI in two months. Will continue to work with outpatient physical therapy  3. AoCKD: repeat Bmet at follow up 4. DM-2: A1c trending in the right direction. CBG has been high during this hospitalization partly from steroid and ICU stress. Check CBG as well.  Significant Procedures: Intubation, IJ line, Myoview, CT head, MRI head, intubation in ICU  Significant Labs and Imaging:   Recent Labs Lab 02/20/15 0745 02/21/15 0601 02/22/15 0624  WBC 7.0 6.9 5.2  HGB 11.0* 10.3* 10.3*  HCT 32.1* 30.7* 29.3*  PLT 120* 124* 128*    Recent Labs Lab 02/18/15 0343 02/19/15 0422 02/20/15 0745 02/21/15 0601 02/21/15 1352 02/22/15 0624  NA 146* 145 138 139 139 141   K 3.3* 3.1* 3.7 3.9 3.9 4.1  CL 107 108 106 106 106 106  CO2 27 27 20* 23 24 24   GLUCOSE 116* 143* 300* 278* 290* 239*  BUN 91* 67* 57* 53* 48* 48*  CREATININE 5.14* 4.27* 3.86* 3.84* 3.58* 3.34*  CALCIUM 7.5* 7.5* 7.6* 7.8* 7.8* 8.2*  MG 2.5* 2.3 1.8 1.8  --  1.9  PHOS 5.8* 4.6 4.4 4.6  --  4.0  ALBUMIN 2.3* 2.2* 2.4* 2.3*  --  2.4*    Results/Tests Pending at Time of Discharge: none  Discharge Medications:    Medication List    TAKE these medications        amLODipine 10 MG tablet  Commonly known as:  NORVASC  Take 1 tablet (10 mg total) by mouth daily. Stop lisinopril .     aspirin EC 325 MG tablet  Take 1 tablet (325 mg total) by mouth daily.     atorvastatin 40 MG tablet  Commonly known as:  LIPITOR  Take 1 tablet (40 mg total) by mouth daily at 6 PM.     carvedilol 6.25 MG tablet  Commonly known as:  COREG  Take 1 tablet (6.25 mg total) by mouth 2 (two) times daily with a meal.     glipiZIDE 5 MG tablet  Commonly known as:  GLUCOTROL  Take 2 pills in the morning and one in the evening for diabetes  Insulin Glargine 100 UNIT/ML Solostar Pen  Commonly known as:  LANTUS SOLOSTAR  Inject 50 units subcutaneously at bedtime.        Discharge Instructions: Please refer to Patient Instructions section of EMR for full details.  Patient was counseled important signs and symptoms that should prompt return to medical care, changes in medications, dietary instructions, activity restrictions, and follow up appointments.   Follow-Up Appointments: Follow-up Information    Follow up with HOPPER,DAVID, MD. Go in 2 days. Walk in anytime on 02/24/2015 between 8 am and 4 pm.    Specialty:  Family Medicine   Contact information:   [place] [id number]       Mercy Riding, MD 02/22/2015, 10:56 PM PGY-1, [place]

## 2015-02-20 NOTE — Evaluation (Signed)
Occupational Therapy Evaluation Patient Details Name: Russell Price MRN: [id number] DOB: [birth year] Today's Date: 02/20/2015    History of Present Illness Pt is a 54 y.o. male (Arabic is primary language). Admitted 9/27 with hemoptysis, later developing dyspnea and tachycardia and was intubated with copious bloody secretions. Recently also had multiple admissions for dizziness. Head CT on 9/30 was suspicious for acute infarct of anterior R thalamus, MRI was motion degraded but also suspicious for acute/ subacute right thalamic infarct. Initial CXR showed bilateral perihilar infiltrates; CXR 10/1 showed decreased bilateral airspace disease with no definite pleural effusion noted. Pt extubated 10/1.    Clinical Impression   Pt admitted as above and currently presents with significant deficits in ADL's and self care tasks as well as functional mobility (Please refer to OT Problem list below). He also demonstrates some inattention Left side. He will benefit from [facility] followed by In-pt Rehab if able to assit in maximizing independence with all ADL's and highest level of function as well as neuro-muscualr retraining, pt/family education and edema control.    Follow Up Recommendations  CIR    Equipment Recommendations  Other (comment) (To be determinted next venue)    Recommendations for Other Services Rehab consult     Precautions / Restrictions Precautions Precautions: Fall Restrictions Weight Bearing Restrictions: No      Mobility Bed Mobility Overal bed mobility: Needs Assistance Bed Mobility: Supine to Sit     Supine to sit: Mod assist     General bed mobility comments: mod A to roll to left and elevate trunk to sitting, mod A to slide hips to EOB, off balance with initial sitting but improved with time  Transfers Overall transfer level: Needs assistance Equipment used: Rolling walker (2 wheeled) Transfers: Sit to/from Stand Sit to Stand: Mod assist;Max assist          General transfer comment: mod A to steady, vc's for hand placement, assist needed to properly place left foot before attempting stand, left knee with partial buckling initially    Balance Overall balance assessment: Needs assistance Sitting-balance support: Single extremity supported;Feet supported Sitting balance-Leahy Scale: Poor Sitting balance - Comments: right lean, worked on increasing attention to left sied in sitting, specifically UE.  Postural control: Right lateral lean Standing balance support: Bilateral upper extremity supported Standing balance-Leahy Scale: Fair                              ADL Overall ADL's : Needs assistance/impaired Eating/Feeding: Set up;Minimal assistance Eating/Feeding Details (indicate cue type and reason): ASsist opening packages, set up Grooming: Wash/dry hands;Wash/dry face;Set up;Minimal assistance;Bed level;Cueing for safety;Cueing for sequencing   Upper Body Bathing: Moderate assistance;Bed level;Cueing for safety;Cueing for sequencing (Simulated - pt prefers male OT for ADL's if possible)   Lower Body Bathing: Maximal assistance;Bed level (See note above - prefers male OT/Aide)   Upper Body Dressing : Maximal assistance;Sitting;Cueing for safety;Cueing for sequencing Upper Body Dressing Details (indicate cue type and reason): pt with lateral lean in sitting requires mod-max assist for sitting balance Lower Body Dressing: Maximal assistance;Sit to/from stand;Cueing for safety;Cueing for sequencing   Toilet Transfer: Maximal assistance;Stand-pivot;Cueing for sequencing;Cueing for safety (Simulated sit to stand and SPT EOB and back to bed) Toilet Transfer Details (indicate cue type and reason): VC's for LUE/LLE and positioning; inattention left side noted Toileting- Clothing Manipulation and Hygiene: Maximal assistance;Sit to/from stand;Cueing for safety;Cueing for sequencing (Simulated)   Tub/ Shower Transfer:   (  TBA) Tub/Shower Transfer Details (indicate cue type and reason): TBA, anticipate +2 assist for safety w/ tub transfer bench Functional mobility during ADLs: Maximal assistance;Rolling walker;Cueing for safety;Cueing for sequencing (Pt took 2 side steps at EOB w/ max assist, vc's  ) General ADL Comments: Pt presents with significant deficits in ADL's and self care tasks as well as functional mobility. He will benfit from [facility] followed by in-pt Rehab if able to assit in maximizing independence with all ADL's and highest level of function. Pt/family were educated in Role of OT and goals. Pt was LUE was also elevated on pillow and family/pt were educated in positioning and performeance of AROM (fisting, el;bow flexion as able) throughout day. Pt/family verbalized understanding of this.     Vision Vision Assessment?: No apparent visual deficits;Vision impaired- to be further tested in functional context. Pt with history of glaucoma right eye, cont to assess left eye in functional context of ADL's/transfers and functional activity.   Perception     Praxis      Pertinent Vitals/Pain Pain Assessment: No/denies pain     Hand Dominance Right   Extremity/Trunk Assessment Upper Extremity Assessment LUE Deficits / Details: decreased grip strength and fine/ gross motor control of LUE, though pt can flex shoulder through full range and sufficient grip to hold RW LUE Sensation: decreased light touch;decreased proprioception LUE Coordination: decreased fine motor;decreased gross motor   Lower Extremity Assessment Lower Extremity Assessment: Defer to PT evaluation   Cervical / Trunk Assessment Cervical / Trunk Assessment: Normal   Communication Communication Communication: Prefers language other than English (Arabic but speaks [place])   Cognition Arousal/Alertness: Awake/alert Behavior During Therapy: WFL for tasks assessed/performed Overall Cognitive Status: Impaired/Different from  baseline Area of Impairment: Problem solving;Safety/judgement         Safety/Judgement: Decreased awareness of deficits   Problem Solving: Slow processing;Requires verbal cues;Requires tactile cues General Comments: decreased attention to left side, decreased awareness of deficits at this point   General Comments       Exercises       Shoulder Instructions      Home Living Family/patient expects to be discharged to:: Private residence Living Arrangements: Spouse/significant other;Other relatives Available Help at Discharge: Family;Available PRN/intermittently Type of Home: Apartment Home Access: Level entry     Home Layout: One level     Bathroom Shower/Tub: Tub/shower unit Shower/tub characteristics: Architectural technologist: Standard     Home Equipment: None          Prior Functioning/Environment Level of Independence: Independent        Comments: pt works in [employer], very physical job per his report    OT Diagnosis: Generalized weakness;Hemiplegia non-dominant side;Other (comment) (right eye w/ glaucoma; monitor left eye in functional context)   OT Problem List: Decreased strength;Decreased range of motion;Decreased activity tolerance;Impaired balance (sitting and/or standing);Impaired vision/perception;Decreased coordination;Decreased cognition;Decreased safety awareness;Decreased knowledge of use of DME or AE;Decreased knowledge of precautions;Impaired UE functional use;Impaired tone;Increased edema   OT Treatment/Interventions: Self-care/ADL training;Therapeutic exercise;Neuromuscular education;Energy conservation;DME and/or AE instruction;Patient/family education;Cognitive remediation/compensation;Therapeutic activities;Balance training    OT Goals(Current goals can be found in the care plan section) Acute Rehab OT Goals Patient Stated Goal: return to home and work Time For Goal Achievement: 03/06/15 Potential to Achieve Goals: Good  OT Frequency: Min  3X/week   Barriers to D/C:            Co-evaluation              End of Session Equipment  Utilized During Treatment: Gait belt;Rolling walker  Activity Tolerance: Patient tolerated treatment well Patient left: in bed;with call bell/phone within reach;with family/visitor present   Time: 0840-0900 OT Time Calculation (min): 20 min Charges:  OT General Charges $OT Visit: 1 Procedure OT Evaluation $Initial OT Evaluation Tier I: 1 Procedure G-Codes:    Almyra Deforest, OTR/L 02/20/2015, 9:19 AM

## 2015-02-21 ENCOUNTER — Inpatient Hospital Stay (HOSPITAL_COMMUNITY): Payer: BLUE CROSS/BLUE SHIELD

## 2015-02-21 DIAGNOSIS — I519 Heart disease, unspecified: Secondary | ICD-10-CM

## 2015-02-21 DIAGNOSIS — I6330 Cerebral infarction due to thrombosis of unspecified cerebral artery: Secondary | ICD-10-CM

## 2015-02-21 LAB — GLUCOSE, CAPILLARY
GLUCOSE-CAPILLARY: 189 mg/dL — AB (ref 65–99)
GLUCOSE-CAPILLARY: 200 mg/dL — AB (ref 65–99)
Glucose-Capillary: 253 mg/dL — ABNORMAL HIGH (ref 65–99)
Glucose-Capillary: 266 mg/dL — ABNORMAL HIGH (ref 65–99)
Glucose-Capillary: 275 mg/dL — ABNORMAL HIGH (ref 65–99)

## 2015-02-21 LAB — CBC WITH DIFFERENTIAL/PLATELET
BASOS ABS: 0 10*3/uL (ref 0.0–0.1)
BASOS PCT: 0 %
EOS PCT: 4 %
Eosinophils Absolute: 0.3 10*3/uL (ref 0.0–0.7)
HCT: 30.7 % — ABNORMAL LOW (ref 39.0–52.0)
Hemoglobin: 10.3 g/dL — ABNORMAL LOW (ref 13.0–17.0)
Lymphocytes Relative: 12 %
Lymphs Abs: 0.9 10*3/uL (ref 0.7–4.0)
MCH: 27.7 pg (ref 26.0–34.0)
MCHC: 33.6 g/dL (ref 30.0–36.0)
MCV: 82.5 fL (ref 78.0–100.0)
MONO ABS: 0.4 10*3/uL (ref 0.1–1.0)
Monocytes Relative: 6 %
NEUTROS ABS: 5.3 10*3/uL (ref 1.7–7.7)
Neutrophils Relative %: 78 %
PLATELETS: 124 10*3/uL — AB (ref 150–400)
RBC: 3.72 MIL/uL — ABNORMAL LOW (ref 4.22–5.81)
RDW: 12.5 % (ref 11.5–15.5)
WBC: 6.9 10*3/uL (ref 4.0–10.5)

## 2015-02-21 LAB — MAGNESIUM: Magnesium: 1.8 mg/dL (ref 1.7–2.4)

## 2015-02-21 LAB — BASIC METABOLIC PANEL
ANION GAP: 9 (ref 5–15)
BUN: 48 mg/dL — ABNORMAL HIGH (ref 6–20)
CO2: 24 mmol/L (ref 22–32)
Calcium: 7.8 mg/dL — ABNORMAL LOW (ref 8.9–10.3)
Chloride: 106 mmol/L (ref 101–111)
Creatinine, Ser: 3.58 mg/dL — ABNORMAL HIGH (ref 0.61–1.24)
GFR calc Af Amer: 20 mL/min — ABNORMAL LOW (ref >60)
GFR, EST NON AFRICAN AMERICAN: 18 mL/min — AB (ref >60)
GLUCOSE: 290 mg/dL — AB (ref 65–99)
POTASSIUM: 3.9 mmol/L (ref 3.5–5.1)
Sodium: 139 mmol/L (ref 135–145)

## 2015-02-21 LAB — RENAL FUNCTION PANEL
Albumin: 2.3 g/dL — ABNORMAL LOW (ref 3.5–5.0)
Anion gap: 10 (ref 5–15)
BUN: 53 mg/dL — AB (ref 6–20)
CHLORIDE: 106 mmol/L (ref 101–111)
CO2: 23 mmol/L (ref 22–32)
Calcium: 7.8 mg/dL — ABNORMAL LOW (ref 8.9–10.3)
Creatinine, Ser: 3.84 mg/dL — ABNORMAL HIGH (ref 0.61–1.24)
GFR calc Af Amer: 19 mL/min — ABNORMAL LOW (ref >60)
GFR calc non Af Amer: 16 mL/min — ABNORMAL LOW (ref >60)
GLUCOSE: 278 mg/dL — AB (ref 65–99)
POTASSIUM: 3.9 mmol/L (ref 3.5–5.1)
Phosphorus: 4.6 mg/dL (ref 2.5–4.6)
Sodium: 139 mmol/L (ref 135–145)

## 2015-02-21 LAB — NM MYOCAR MULTI W/SPECT W/WALL MOTION / EF
CHL CUP MPHR: 164 {beats}/min
CHL CUP NUCLEAR SDS: 8
CHL CUP NUCLEAR SSS: 10
CSEPEW: 1 METS
CSEPHR: 64 %
LHR: 0.28
LV sys vol: 102 mL
LVDIAVOL: 157 mL
Peak HR: 105 {beats}/min
Rest HR: 74 {beats}/min
SRS: 2
TID: 1.11

## 2015-02-21 LAB — APTT: aPTT: 26 seconds (ref 24–37)

## 2015-02-21 LAB — PROTIME-INR
INR: 1.2 (ref 0.00–1.49)
PROTHROMBIN TIME: 15.4 s — AB (ref 11.6–15.2)

## 2015-02-21 MED ORDER — TECHNETIUM TC 99M SESTAMIBI GENERIC - CARDIOLITE
10.0000 | Freq: Once | INTRAVENOUS | Status: AC | PRN
  Administered 2015-02-21: 10 via INTRAVENOUS

## 2015-02-21 MED ORDER — REGADENOSON 0.4 MG/5ML IV SOLN
INTRAVENOUS | Status: AC
  Administered 2015-02-21: 0.4 mg via INTRAVENOUS
  Filled 2015-02-21: qty 5

## 2015-02-21 MED ORDER — REGADENOSON 0.4 MG/5ML IV SOLN
0.4000 mg | Freq: Once | INTRAVENOUS | Status: AC
  Administered 2015-02-21: 0.4 mg via INTRAVENOUS
  Filled 2015-02-21: qty 5

## 2015-02-21 MED ORDER — TECHNETIUM TC 99M SESTAMIBI GENERIC - CARDIOLITE
30.0000 | Freq: Once | INTRAVENOUS | Status: AC | PRN
  Administered 2015-02-21: 30 via INTRAVENOUS

## 2015-02-21 NOTE — Progress Notes (Signed)
Inpatient Rehabilitation  I note that pt. is off the floor for lexiscan and is scheduled to have LHC today.  I have attempted to reach his spouse by phone and have requested she call me when she receives the phone message so that we can plan for pt's rehabilitation needs.  I will attempt to see pt. later today pending his procedures.  Please call if questions.  [place] Admissions Coordinator Cell [phone] Office [phone]

## 2015-02-21 NOTE — Progress Notes (Signed)
Inpatient Diabetes Program Recommendations  AACE/ADA: New Consensus Statement on Inpatient Glycemic Control (2015)  Target Ranges:  Prepandial:   less than 140 mg/dL      Peak postprandial:   less than 180 mg/dL (1-2 hours)      Critically ill patients:  140 - 180 mg/dL   Results for Russell Price, Russell Price (MRN [id number]) as of 02/21/2015 10:27  Ref. Range 02/20/2015 09:09 02/20/2015 11:45 02/20/2015 17:33 02/20/2015 21:56  Glucose-Capillary Latest Ref Range: 65-99 mg/dL 333 (H) 307 (H) 233 (H) 185 (H)    Results for Russell Price, Russell Price (MRN [id number]) as of 02/21/2015 10:27  Ref. Range 02/21/2015 07:34  Glucose-Capillary Latest Ref Range: 65-99 mg/dL 253 (H)    Home Diabetes meds: Lantus 50 units QHS     Glipizide 10 mg QAM, 5 mg QPM  Current Insulin Orders: Novolog Moderate SSI (0-15 units) TID AC/HS   Inpatient Diabetes Program Recommendations: Insulin - Basal: Glucose in 200-300 range. Note patient had hypoglycemia with 45 units of basal insulin.  Please consider starting 1/3 of patient's home dose- Lantus 15 units daily    ----Will follow patient during hospitalization----  Wyn Quaker RN, MSN, CDE Diabetes Coordinator Inpatient Glycemic Control Team Team Pager: [phone] (8a-5p)

## 2015-02-21 NOTE — Progress Notes (Signed)
Per MD order, central line removed. IV cathter intact. Vaseline pressure gauze to site, pressure held x 5 min, no bleeding to site. Pt instructed not to get out of bed for 30 min after the removal of the central line. Instucted to keep dressing CDI x 24hours, if bleeding occurs hold pressure and call nurse.Pt nor family had any questions. Russell Price

## 2015-02-21 NOTE — Therapy (Signed)
SLP Cancellation Note  Unable to complete SLP assessment of diet tolerance, secondary to pt currently off unit. Will continue efforts.  Shayn Madole B. Quentin Ore [facility], [place] [phone]

## 2015-02-21 NOTE — Progress Notes (Signed)
    Nuclear stress test read as high risk. I discussed with results with primary cardiologist, Dr. Sallyanne Kuster who also reviewed nuclear images. He felt the images were more consistent with low risk, inferior wall ischemia. Patient not a good candidate for cath due to elevated creat, recent CVA and pulmonary hemorrhage. Cath not felt to be indicated at this time. Continue medical therapy. Okay to D/C to inpatient rehab. I spoke with family medicine team about this.   Angelena Form PA-C  [facility]

## 2015-02-21 NOTE — Progress Notes (Signed)
     The patient was seen in nuclear medicine for a lexiscan myoview. He tolerated the procedure well. Await nuclear images.   Perry Mount PA-C  [facility]

## 2015-02-21 NOTE — Progress Notes (Signed)
STROKE TEAM PROGRESS NOTE   SUBJECTIVE (INTERVAL HISTORY) No complaints. Pt just returned from nuclear cardiology test. MRI with contrast cannot be dine due to renal failure. TCD bubble study negative y`day.    OBJECTIVE Temp:  [98.5 F (36.9 C)-98.9 F (37.2 C)] 98.6 F (37 C) (10/04 1351) Pulse Rate:  [67-102] 77 (10/04 1402) Cardiac Rhythm:  [-] Normal sinus rhythm (10/04 0713) Resp:  [16-24] 24 (10/04 1402) BP: (145-179)/(55-72) 165/67 mmHg (10/04 1402) SpO2:  [97 %-100 %] 100 % (10/04 1402) Weight:  [188 lb 4.4 oz (85.4 kg)] 188 lb 4.4 oz (85.4 kg) (10/04 0533)  CBC:   Recent Labs Lab 02/20/15 0745 02/21/15 0601  WBC 7.0 6.9  NEUTROABS 5.7 5.3  HGB 11.0* 10.3*  HCT 32.1* 30.7*  MCV 84.0 82.5  PLT 120* 124*    Basic Metabolic Panel:   Recent Labs Lab 02/20/15 0745 02/21/15 0601 02/21/15 1352  NA 138 139 139  K 3.7 3.9 3.9  CL 106 106 106  CO2 20* 23 24  GLUCOSE 300* 278* 290*  BUN 57* 53* 48*  CREATININE 3.86* 3.84* 3.58*  CALCIUM 7.6* 7.8* 7.8*  MG 1.8 1.8  --   PHOS 4.4 4.6  --     Lipid Panel:     Component Value Date/Time   CHOL 191 02/18/2015 0344   TRIG 290* 02/18/2015 0344   HDL 23* 02/18/2015 0344   CHOLHDL 8.3 02/18/2015 0344   VLDL 58* 02/18/2015 0344   LDLCALC 110* 02/18/2015 0344   HgbA1c:  Lab Results  Component Value Date   HGBA1C 8.5* 02/18/2015   Urine Drug Screen: No results found for: LABOPIA, COCAINSCRNUR, LABBENZ, AMPHETMU, THCU, LABBARB    IMAGING  MRI HEAD   02/18/2015    Exam is motion degraded.  Ill-defined region of altered signal intensity right peri third ventricular region/ anterior lateral margin of the right thalamus may represent an acute/subacute infarct although given the indistinct appearance, follow-up imaging will be necessary to exclude the possibility of this representing result of infection or mass. This causes slight impression upon the right lateral aspect of the third ventricle.  Focal region of  blood breakdown products superior vermis. Question small cavernoma as versus result of hemorrhage from prior infarct or trauma.    MRA HEAD   02/18/2015    Exam is motion degraded.  Mild to moderate narrowing cavernous segment right internal carotid artery.  Mild narrowing supraclinoid segment internal carotid artery bilaterally.  Focal stenosis of left M2 segment branch.  Left vertebral artery ends in a left posterior inferior cerebellar artery distribution. The left posterior inferior cerebellar artery is narrowed and irregular.  No high-grade stenosis of the distal right vertebral artery.  Narrowed right posterior inferior cerebellar artery.  Mild narrowing mid aspect of the basilar artery.  Mild narrowing of portions of the anterior inferior cerebellar artery bilaterally.  Moderate narrowing right superior cerebellar artery.  Mild to moderate focal narrowing proximal left posterior cerebral artery.     Dg Chest Port 1 View Slight increased left perihilar subsegmental atelectasis. Stable low-grade pulmonary interstitial edema.     Dg Chest Port 1 View 02/18/2015   Decreased bilateral airspace disease.  Cardiomegaly.      PHYSICAL EXAM  Pleasant African-male who is intubated but not sedated. . Afebrile. Head is nontraumatic. Neck is supple without bruit.    Cardiac exam no murmur or gallop. Lungs are clear to auscultation. Distal pulses are well felt. Neurological Exam : Awake alert  Oriented  x 3 . able to follow commands quite well.   Extraocular movements are full range. Slight restriction of upgaze. Pupils irregular but reactive. Vision acuity adequate. Blinks to threat bilaterally. No facial weakness. Tongue midline. Good cough and gag. Able to move all 4 extremities against gravity but mild weakness of left grip, hand and hip flexors   . Coordination and gait cannot be reliably tested.   ASSESSMENT/PLAN [name] is a 54 y.o. male with history of hypertension, diabetes, and chronic  kidney disease presenting with hemoptysis, dyspnea, and tachycardia. He did not receive IV t-PA due to unknown time of onset.   Stroke:  Possible non-dominant R thalamic infarct vs infectious etiology, rule out PFO.  Resultant   Mild left hemiparesis and upgaze restriction  MRI  Ill-defined region of altered signal intensity right peri third ventricular region/ anterior lateral margin of the right thalamus, may represent an acute/subacute infarct  MRA  motion degraded. Mild irregularities without high-grade stenosis   MRI with and without contrast to help determine etiology not possible due to renal failure  Carotid Doppler pending  2D Echo EF 45-50%. No cardiac source of emboli identified.  Bilateral lower extremity duplex - negative for DVT  LDL 110  HgbA1c 8.7  VTE prophylaxis - SCDs Diet NPO time specified Except for: Sips with Meds  no antithrombotic prior to admission, now on aspirin 325 mg orally every day  Patient counseled to be compliant with his antithrombotic medications  Ongoing aggressive stroke risk factor management  Therapy recommendations: CIR  Disposition: Pending  Hypertension  Stable  Hyperlipidemia  Home meds: No lipid lowering medications prior to admission  LDL 110, goal < 70  Add Lipitor 40 mg daily  Continue statin at discharge  Diabetes  HgbA1c 8.7, goal < 7.0  Uncontrolled  Other Stroke Risk Factors  Cigarette smoker, quit smoking 31 years ago.  Other Active Problems  Anemia  Mild hypokalemia  Hospital day # 7    I have personally examined this patient, reviewed notes, independently viewed imaging studies, participated in medical decision making and plan of care. I have made any additions or clarifications directly to the above note. A . Plan to check  repeat MRI scan of the brain without  Contrast as outpatient upon f/u or earlier incase of neurological worsening. Explained rationale to patient and wife and answered  questions. Stroke team will sign off. Call for questions. F/U in stroke clinic in 2 months.  Antony Contras, MD Medical Director [facility] Pager: [phone] 02/21/2015 3:06 PM    To contact Stroke Continuity provider, please refer to [email or web address] After hours, contact General Neurology

## 2015-02-21 NOTE — Progress Notes (Signed)
Patient Name: DELTA ISING Date of Encounter: 02/21/2015  Primary Cardiologist: new - Dr. Sallyanne Kuster   Principal Problem:   Acute respiratory failure with hypoxemia ([place]) Active Problems:   ARDS (adult respiratory distress syndrome) ([place])   Hemoptysis   Cerebral thrombosis with cerebral infarction ([place])   Acute kidney injury ([place])    SUBJECTIVE  Denies any discomfort, feels good. No SOB or CP.   CURRENT MEDS .  stroke: mapping our early stages of recovery book   Does not apply Once  . amLODipine  10 mg Oral Daily  . antiseptic oral rinse  7 mL Mouth Rinse q12n4p  . aspirin  325 mg Oral Daily  . atorvastatin  40 mg Oral q1800  . chlorhexidine  15 mL Mouth Rinse BID  . insulin aspart  0-15 Units Subcutaneous TID AC & HS  . sodium chloride  3 mL Intravenous Q12H    OBJECTIVE  Filed Vitals:   02/20/15 2156 02/20/15 2351 02/21/15 0531 02/21/15 0533  BP: 179/70 153/60 160/67   Pulse: 75 78 81   Temp: 98.9 F (37.2 C)  98.5 F (36.9 C)   TempSrc:   Oral   Resp: 16  18   Height:      Weight:    188 lb 4.4 oz (85.4 kg)  SpO2: 99%  98%     Intake/Output Summary (Last 24 hours) at 02/21/15 0811 Last data filed at 02/21/15 0649  Gross per 24 hour  Intake 1196.5 ml  Output      0 ml  Net 1196.5 ml   Filed Weights   02/19/15 2023 02/20/15 0506 02/21/15 0533  Weight: 187 lb 13.3 oz (85.2 kg) 188 lb 0.8 oz (85.3 kg) 188 lb 4.4 oz (85.4 kg)    PHYSICAL EXAM  General: Pleasant, NAD. Neuro: Alert and oriented X 3. Moves all extremities spontaneously. Psych: Normal affect. HEENT:  Normal  Neck: Supple without bruits or JVD. R IJ in place Lungs:  Resp regular and unlabored, CTA. Heart: RRR no s3, s4, or murmurs. Abdomen: Soft, non-tender, non-distended, BS + x 4.  Extremities: No clubbing, cyanosis or edema. DP/PT/Radials 2+ and equal bilaterally.  Accessory Clinical Findings  CBC  Recent Labs  02/20/15 0745 02/21/15 0601  WBC 7.0 6.9  NEUTROABS 5.7 5.3    HGB 11.0* 10.3*  HCT 32.1* 30.7*  MCV 84.0 82.5  PLT 120* [id number]*   Basic Metabolic Panel  Recent Labs  02/20/15 0745 02/21/15 0601  NA 138 139  K 3.7 3.9  CL 106 106  CO2 20* 23  GLUCOSE 300* 278*  BUN 57* 53*  CREATININE 3.86* 3.84*  CALCIUM 7.6* 7.8*  MG 1.8 1.8  PHOS 4.4 4.6   Liver Function Tests  Recent Labs  02/20/15 0745 02/21/15 0601  ALBUMIN 2.4* 2.3*    TELE NSR without significant ventricular ectopy    ECG  No new EKG  Echocardiogram 02/20/2015  LV EF: 55% -  60%  ------------------------------------------------------------------- Indications:   CVA 436.  ------------------------------------------------------------------- History:  PMH: Former Smoker, Blind Right Eye. Risk factors: Hypertension. Diabetes mellitus.  ------------------------------------------------------------------- Study Conclusions  - Left ventricle: The cavity size was normal. Wall thickness was increased in a pattern of severe LVH. Systolic function was normal. The estimated ejection fraction was in the range of 55% to 60%. Wall motion was normal; there were no regional wall motion abnormalities. Doppler parameters are consistent with abnormal left ventricular relaxation (grade 1 diastolic dysfunction). - Left atrium: The atrium was  mildly dilated.     Radiology/Studies  Ct Head Wo Contrast  02/17/2015   CLINICAL DATA:  Altered mental status. The patient is not moving his left side much.  EXAM: CT HEAD WITHOUT CONTRAST  TECHNIQUE: Contiguous axial images were obtained from the base of the skull through the vertex without intravenous contrast.  COMPARISON:  None.  FINDINGS: There is low-density in the anterior right thalamus. There is no midline shift or hydrocephalus. There is no acute hemorrhage. The bony calvarium is intact. The visualized sinuses are clear.  IMPRESSION: Low-density in the anterior right thalamus suspicious for acute infarct. Further  evaluation with brain MRI is recommended.   Electronically Signed   By: Abelardo Diesel M.D.   On: 02/17/2015 19:06   Ct Chest Wo Contrast  02/14/2015   CLINICAL DATA:  Hemoptysis for 1 day with profound dyspnea and tachycardia. History of hypertension and diabetes. Adult respiratory distress syndrome. Initial encounter.  EXAM: CT CHEST WITHOUT CONTRAST  TECHNIQUE: Multidetector CT imaging of the chest was performed following the standard protocol without IV contrast.  COMPARISON:  Radiographs 02/14/2015 and 01/03/2014.  FINDINGS: Mediastinum/Nodes: There are mildly prominent mediastinal lymph nodes, including a 9 mm right paratracheal node on image number 20 and a 10 mm precarinal node on image 23. Hilar evaluation limited by the lack of intravenous contrast and adjacent airspace opacities. No axillary adenopathy.The patient is intubated. The endotracheal tube terminates in the distal trachea above the carina. The thyroid gland, trachea and esophagus demonstrate no significant findings. The heart appears mildly enlarged. There is no pericardial effusion. Mild coronary artery calcifications are noted.  Lungs/Pleura: As demonstrated radiographically, there are extensive bilateral perihilar airspace opacities with air bronchograms. There is consolidation dependently in both lower lobes. No endobronchial lesion, parenchymal necrosis or significant pleural effusion identified.  Upper abdomen: The stomach appears mildly distended. Otherwise unremarkable.  Musculoskeletal/Chest wall: There is no chest wall mass or suspicious osseous finding. There is a small amount of gas inferior to the left clavicle from presumed attempted central line placement. No pneumothorax or pneumomediastinum. Mild bilateral gynecomastia noted.  IMPRESSION: 1. Bilateral perihilar infiltrates with air bronchograms and dependent consolidation. Findings may be secondary to pneumonia, ARDS, hemorrhage or a combination thereof. 2. Mildly prominent  mediastinal lymph nodes, likely reactive. 3. No significant pleural effusion. 4. Coronary artery calcifications.   Electronically Signed   By: Richardean Sale M.D.   On: 02/14/2015 17:52   Mr Jodene Nam Head Wo Contrast  02/18/2015   CLINICAL DATA:  54 year old diabetic hypertensive male with dizziness and altered mental status. Subsequent encounter.  EXAM: MRI HEAD WITHOUT CONTRAST  MRA HEAD WITHOUT CONTRAST  TECHNIQUE: Multiplanar, multiecho pulse sequences of the brain and surrounding structures were obtained without intravenous contrast. Angiographic images of the head were obtained using MRA technique without contrast.  COMPARISON:  02/17/2015 head CT.  No comparison brain MR.  FINDINGS: MRI HEAD FINDINGS  Exam is motion degraded.  Ill-defined region of altered signal intensity right peri third ventricular region/ anterior lateral margin of the right thalamus may represent an acute/subacute infarct although given the indistinct appearance, follow-up imaging will be necessary to exclude the possibility of this representing result of infection or mass. This causes slight impression upon the right lateral aspect of the third ventricle.  Focal region of blood breakdown products superior vermis. Question small cavernoma as versus result of hemorrhage from prior infarct or trauma.  Tiny nonspecific tiny cerebral spinal fluid appearing structures adjacent to the left periatrial region  may represent result of prior insult, small perivascular spaces or tiny neural glial cyst.  Partial opacification mastoid air cells without obstructing lesion of the eustachian tube noted.  No hydrocephalus.  Prominent mineral deposition globus pallidus incidentally noted.  MRA HEAD FINDINGS  Exam is motion degraded.  Mild to moderate narrowing cavernous segment right internal carotid artery.  Mild narrowing supraclinoid segment internal carotid artery bilaterally.  Focal stenosis of left M2 segment branch.  Left vertebral artery ends in a  left posterior inferior cerebellar artery distribution. The left posterior inferior cerebellar artery is narrowed and irregular.  No high-grade stenosis of the distal right vertebral artery.  Narrowed right posterior inferior cerebellar artery.  Mild narrowing mid aspect of the basilar artery.  Mild narrowing of portions of the anterior inferior cerebellar artery bilaterally.  Moderate narrowing right superior cerebellar artery.  Mild to moderate focal narrowing proximal left posterior cerebral artery.  No aneurysm noted.  IMPRESSION: MRI HEAD  Exam is motion degraded.  Ill-defined region of altered signal intensity right peri third ventricular region/ anterior lateral margin of the right thalamus may represent an acute/subacute infarct although given the indistinct appearance, follow-up imaging will be necessary to exclude the possibility of this representing result of infection or mass. This causes slight impression upon the right lateral aspect of the third ventricle.  Focal region of blood breakdown products superior vermis. Question small cavernoma as versus result of hemorrhage from prior infarct or trauma.  MRA HEAD  Exam is motion degraded.  Mild to moderate narrowing cavernous segment right internal carotid artery.  Mild narrowing supraclinoid segment internal carotid artery bilaterally.  Focal stenosis of left M2 segment branch.  Left vertebral artery ends in a left posterior inferior cerebellar artery distribution. The left posterior inferior cerebellar artery is narrowed and irregular.  No high-grade stenosis of the distal right vertebral artery.  Narrowed right posterior inferior cerebellar artery.  Mild narrowing mid aspect of the basilar artery.  Mild narrowing of portions of the anterior inferior cerebellar artery bilaterally.  Moderate narrowing right superior cerebellar artery.  Mild to moderate focal narrowing proximal left posterior cerebral artery.   Electronically Signed   By: Genia Del  M.D.   On: 02/18/2015 17:59   Mr Brain Wo Contrast  02/18/2015   CLINICAL DATA:  54 year old diabetic hypertensive male with dizziness and altered mental status. Subsequent encounter.  EXAM: MRI HEAD WITHOUT CONTRAST  MRA HEAD WITHOUT CONTRAST  TECHNIQUE: Multiplanar, multiecho pulse sequences of the brain and surrounding structures were obtained without intravenous contrast. Angiographic images of the head were obtained using MRA technique without contrast.  COMPARISON:  02/17/2015 head CT.  No comparison brain MR.  FINDINGS: MRI HEAD FINDINGS  Exam is motion degraded.  Ill-defined region of altered signal intensity right peri third ventricular region/ anterior lateral margin of the right thalamus may represent an acute/subacute infarct although given the indistinct appearance, follow-up imaging will be necessary to exclude the possibility of this representing result of infection or mass. This causes slight impression upon the right lateral aspect of the third ventricle.  Focal region of blood breakdown products superior vermis. Question small cavernoma as versus result of hemorrhage from prior infarct or trauma.  Tiny nonspecific tiny cerebral spinal fluid appearing structures adjacent to the left periatrial region may represent result of prior insult, small perivascular spaces or tiny neural glial cyst.  Partial opacification mastoid air cells without obstructing lesion of the eustachian tube noted.  No hydrocephalus.  Prominent mineral deposition globus  pallidus incidentally noted.  MRA HEAD FINDINGS  Exam is motion degraded.  Mild to moderate narrowing cavernous segment right internal carotid artery.  Mild narrowing supraclinoid segment internal carotid artery bilaterally.  Focal stenosis of left M2 segment branch.  Left vertebral artery ends in a left posterior inferior cerebellar artery distribution. The left posterior inferior cerebellar artery is narrowed and irregular.  No high-grade stenosis of the  distal right vertebral artery.  Narrowed right posterior inferior cerebellar artery.  Mild narrowing mid aspect of the basilar artery.  Mild narrowing of portions of the anterior inferior cerebellar artery bilaterally.  Moderate narrowing right superior cerebellar artery.  Mild to moderate focal narrowing proximal left posterior cerebral artery.  No aneurysm noted.  IMPRESSION: MRI HEAD  Exam is motion degraded.  Ill-defined region of altered signal intensity right peri third ventricular region/ anterior lateral margin of the right thalamus may represent an acute/subacute infarct although given the indistinct appearance, follow-up imaging will be necessary to exclude the possibility of this representing result of infection or mass. This causes slight impression upon the right lateral aspect of the third ventricle.  Focal region of blood breakdown products superior vermis. Question small cavernoma as versus result of hemorrhage from prior infarct or trauma.  MRA HEAD  Exam is motion degraded.  Mild to moderate narrowing cavernous segment right internal carotid artery.  Mild narrowing supraclinoid segment internal carotid artery bilaterally.  Focal stenosis of left M2 segment branch.  Left vertebral artery ends in a left posterior inferior cerebellar artery distribution. The left posterior inferior cerebellar artery is narrowed and irregular.  No high-grade stenosis of the distal right vertebral artery.  Narrowed right posterior inferior cerebellar artery.  Mild narrowing mid aspect of the basilar artery.  Mild narrowing of portions of the anterior inferior cerebellar artery bilaterally.  Moderate narrowing right superior cerebellar artery.  Mild to moderate focal narrowing proximal left posterior cerebral artery.   Electronically Signed   By: Genia Del M.D.   On: 02/18/2015 17:59   US Renal  02/15/2015   CLINICAL DATA:  Acute kidney injury  EXAM: RENAL / URINARY TRACT ULTRASOUND COMPLETE  COMPARISON:  None.   FINDINGS: Right Kidney:  Length: 12 cm. Borderline echogenic cortex. Mild and nonspecific perinephric edema. No mass or hydronephrosis visualized.  Left Kidney:  Length: 12 cm. Borderline echogenic cortex. 11 mm upper pole cyst with possible thin septation. No solid mass or hydronephrosis visualized.  Bladder:  Not visualized with report of Foley catheter.  Possibility of gallbladder sludge, without visualized wall thickening.  IMPRESSION: No hydronephrosis.   Electronically Signed   By: Monte Fantasia M.D.   On: 02/15/2015 04:12   Dg Chest Port 1 View  02/20/2015   CLINICAL DATA:  Pneumonia, ARDS, acute respiratory failure, CVA.  EXAM: PORTABLE CHEST 1 VIEW  COMPARISON:  Portable chest x-ray of February 18, 2015  FINDINGS: The lungs are adequately inflated. The perihilar interstitial markings are increased but not greatly changed from the previous study. The heart is mildly enlarged. The central pulmonary vascularity is prominent. The right internal jugular venous catheter tip projects over the proximal third of the SVC. There is left perihilar subsegmental atelectasis which is more conspicuous today.  IMPRESSION: Slight increased left perihilar subsegmental atelectasis. Stable low-grade pulmonary interstitial edema.   Electronically Signed   By: David  Martinique M.D.   On: 02/20/2015 07:48   Dg Chest Port 1 View  02/18/2015   CLINICAL DATA:  Followup edema/infection.  EXAM: PORTABLE CHEST  1 VIEW  COMPARISON:  02/15/2015  FINDINGS: Cardiomegaly again noted.  A right IJ central venous catheter is unchanged with tip overlying the mid - upper SVC.  Bilateral airspace opacities have improved.  No definite pleural effusion or pneumothorax noted.  IMPRESSION: Decreased bilateral airspace disease.  Cardiomegaly.   Electronically Signed   By: Margarette Canada M.D.   On: 02/18/2015 13:33   Dg Chest Port 1 View  02/15/2015   CLINICAL DATA:  Evaluate airway  EXAM: PORTABLE CHEST 1 VIEW  COMPARISON:  02/14/2015  FINDINGS:  Support devices are stable. There is cardiomegaly. Bilateral airspace opacities are noted, right greater than left. No visible effusions. No acute bony abnormality.  IMPRESSION: Continued bilateral airspace disease, right greater than left, not significantly changed. This could represent asymmetric edema or infection.   Electronically Signed   By: Rolm Baptise M.D.   On: 02/15/2015 07:19   Dg Chest Port 1 View  02/14/2015   CLINICAL DATA:  Status post central line placement  EXAM: PORTABLE CHEST 1 VIEW  COMPARISON:  February 14, 2015 10:59 a.m.  FINDINGS: Right jugular central venous line is identified in the superior vena cava. Endotracheal tube is identified with distal tip 5.6 cm from carina. Nasogastric tube is identified distal tip not included but is at least in the stomach. There are airspace opacities throughout both lungs, right greater than left unchanged compared to prior exam. No definite pleural line is identified to suggest pneumothorax. The bony structures stable.  IMPRESSION: Right jugular central venous line distal tip in the superior vena cava. Endotracheal tube in good position. There is no pneumothorax.   Electronically Signed   By: Abelardo Diesel M.D.   On: 02/14/2015 19:48   Dg Chest Portable 1 View  02/14/2015   CLINICAL DATA:  Labored breathing.  EXAM: PORTABLE CHEST 1 VIEW  COMPARISON:  None.  FINDINGS: The patient has extensive bilateral airspace disease, worse on the right. No pleural effusion or pneumothorax. Heart size is upper normal.  IMPRESSION: Extensive bilateral airspace disease could be due to asymmetric edema, pneumonia or possibly pulmonary hemorrhage.   Electronically Signed   By: Inge Rise M.D.   On: 02/14/2015 16:25   Dg Abd Portable 1v  02/14/2015   CLINICAL DATA:  Encounter for nasogastric tube placement.  EXAM: PORTABLE ABDOMEN - 1 VIEW  COMPARISON:  Chest radiograph at 1938 hr  FINDINGS: Tip and side port of the enteric tube below the diaphragm in the  stomach. No dilated bowel loops to suggest obstruction. Dense consolidation in the right lung with paucity of lung markings peripherally, suboptimally assessed on current exam.  IMPRESSION: Tip and side port of the enteric tube below the diaphragm in the stomach.   Electronically Signed   By: Jeb Levering M.D.   On: 02/14/2015 22:28    ASSESSMENT AND PLAN   1. Pulmonary hemorrhage: unclear cause - Unclear cause, patient denies history of DVT, TB or h/o alcoholism (esophageal varices)  2. Acute respiratory failure secondary to #1  - Although chest x-ray was concerning for mild interstitial edema, on physical exam, patient does not have obvious rale, lung CTA, no JVD or lower extremity edema on exam. - he is euvolemic on exam  3. Acute CVA - R thalamus stroke - continue ASA. Transcranial doppler negative. Pending MRI with contrast of brain  - unclear cause, plan for 30 day event monitor after discharge  4. Elevated troponin - trop 3 --> 5.5 --> 11.78 --> 8.2, TWI  in inferolateral leads - Initially felt secondary to demand ischemia, trop elevated troponin up to 12, higher than expected. - Not a good candidate for invasive therapy given recent hemoptysis, stroke and severe CKD, noted EF has improved, may have a component of stress induced cardiomyopathy initially  - lexiscan myoview today, if low risk, patient is cleared to go to CIR from cardiac perspective  5. HTN: Systolic blood pressure [id number] to 190s, currently off home amlodipine, after initial period of permissive hypertension, consider restarting home blood pressure medication.  6. DM II: hgb A1C 8.5, need better control  7. Acute on chronic renal insufficiency - Renal function improving, creatinine peaked on 9/29 at 5.9, creatinine 3.84.  Hilbert Corrigan, PA-C Pager: [phone]  I have seen and examined the patient along with Almyra Deforest, PA-C.  I have reviewed the chart, notes and new data.  I agree with PA's note.  Key new complaints: denies CV complaints Key examination changes: bibasilar crackles Key new findings / data: creat seems to be stabilizing  PLAN: No new recommendations until we have a chance to review nuclear perfusion images. Unless there is a very large area of ischemia with high risk, it is unlikely we will pursue coronary angio due to the severity of his renal insufficiency.  Sanda Klein, MD, [place] [phone] 02/21/2015, 10:03 AM

## 2015-02-21 NOTE — Progress Notes (Signed)
PT Cancellation Note  Patient Details Name: Russell Price MRN: [id number] DOB: 01-09-1961   Cancelled Treatment:    Reason Eval/Treat Not Completed: Patient at procedure or test/unavailable   Kaylyne Axton 02/21/2015, 12:01 PM

## 2015-02-21 NOTE — Progress Notes (Signed)
Inpatient Rehabilitation  I met with the patient and his wife at the bedside to discuss his post acute rehab options.  I provided them with informational booklets about our IP Rehab porgram and answered their questions.  Pt. Indicates he would like for me to pursue [employer] for authorization and I have initiated this process.  I will update the acute team when I hear from insurance.  I have notified [employer], CM of the above.  Please call if questions.  [place] Admissions Coordinator Cell [phone] Office [phone]

## 2015-02-21 NOTE — Progress Notes (Signed)
Family Medicine Teaching Service Daily Progress Note Intern Pager: [phone]  Patient name: Russell Price Medical record number: [id number] Date of birth: 1960/06/03 Age: 54 y.o. Gender: male  Primary Care Provider: No PCP Per Patient Consultants: card, neuro, CCM,  Code Status: full  Pt Overview and Major Events to Date:  9/27 - Admitted to hospital 9/27 - Intubated & central line placed. Attempted BAL. 9/28 - BAL by Hyman Bible & Nephrology consulted 9/30 - Neurology consult 10/01Shella Spearing, nephrology consult 10/3 - ICU to  Floor, Card consult 10/04- MRI brain, LHC  Assessment and Plan: Newly diagnosed CHF: Echo on 9/28 w/ mild hypokinesis, EF 45-50%. Echo on 10/03 with EF 55-60% -Appreciate card recs  -Lexscan Myoview  -unlikely we will pursue coronary angio due to the severity of his renal insufficiency  -30 days event monitor after discharge  Hypertensive emergency- resolved. Elevated troponin (trop 3 --> 5.5 --> 11.78 --> 8.2, TWI in inferolateral leads) while in ICU thought to be demand process. Still hypertensive requiring prn hydralazine. Started Amlodipine 10 mg daily on 10/03.  SBP 153-179. -Continue Amlodipine. May take some times to kick in. -Will consider adding another antihypertensive med if BP stays up. -Appreciate card recs  AMS/hx of CVA: AMS resolved. Infarct in anterior right thalamus , MRI with ill defined region right thalamus may represent acute/subacute Infarct . MRA w/ mild to mod narrowing w/ no high grade stenosis. Left carotid doppler 1-39% steosis. Not able to assess right due to IJ line. TTE bubble study without PFO. PVL ext negative for DVT. -Appreciate neuro recs  -MRI of the brain today (10/04). -no evidence of PFO on transcranial doppler with Bubbles. -continue Lipitor  -continue ASA -PT recs: 4x/week -Passed SLP -Met CIR admission criteria pending insurance prior auth  H/O DM-2: A1c 8.5 down from 10.3 three months ago. On Lantus 50 units at home.  Had Hypoglycemic evens on 10/1 that has resolved. CBG 253 this AM. On Novolog SSI with meals. -Consider introducing Lantus at low dose -Accu-Cheks every 4 hours  -Continue SSI -Will consider restarting home Lantus   AoCKD with AGMA - baseline Cr seems to be 2.5. sCr 4.28 on admission>5.9>>>3.84 on 10/04. Anion Gap metabolic Acidosis - resolved -Wt 4 lbs up from admission. -Following Electrolytes daily  -Nephrology following -K+ replaced   Hyperkalemia - resolved. K 3.7  Normocytic Anemia - baseline Hgb seems 11. Hgb 11>10.3 today. Likely secondary to CKD. -H&H stable -follow CBC  Acute hypoxic respiratory failure - resolved. SOB and hemoptysis on presentation. CXR showed R>L edema. He was intubated in ED for airway protection in setting of copious bloody secretions. Admitted to ICU. Extubated on 10/01. DAH vs Pneumonia. Diffuse alveolar hemorrhage - (ANA, RF, CCP, GBM, ANCA,MPO, & PR3 negative). Works at a [employer] at [employer]. Reports exposure to chemical but uses face mask. Lung exam with no wheeze or crackles. No work of breathing. Sating 99-100 on RA. BCx2  HCAP vs atypical pneumonia. With Leukocytosis that has resolved. Viral resp panel, U strep, U legionella, BAL 9/28,  AFB 9/28, smear, fungal cx, cytology, Sputum BAL ,Oral flora. AFB stain and cultures & smear negative.  -Stopping antibiotics today -Zosyn, start date 9/27-10/03 -Vancomycin, start date 9/27-10/03  Access: right IJ and peripheral IV -will discontinue right IJ today   Disposition: floor status pending MRI and LHC finding today. Possible discharge to CIR  Subjective:  Saw patient this morning. He was lying in the bed with wife sitting at bedside. He reports sleeping  well. Denies pain, SOB, or chest pain. Understands the plan for today (MRI and LHC)  Objective: Temp:  [98.5 F (36.9 C)-99.1 F (37.3 C)] 98.5 F (36.9 C) (10/04 0531) Pulse Rate:  [67-81] 81 (10/04 0531) Resp:  [16-20] 18 (10/04  0531) BP: (153-179)/(60-70) 160/67 mmHg (10/04 0531) SpO2:  [98 %-100 %] 98 % (10/04 0531) Weight:  [188 lb 4.4 oz (85.4 kg)] 188 lb 4.4 oz (85.4 kg) (10/04 0533) Physical Exam: Gen: NAD, well-appearing  Eyes: cataract in right eye,  no conjunctival injection. Nares: clear, no erythema, swelling or congestion Oropharynx: clear, moist CV: RRR. S1 & S2 audible, no murmurs, rubs. 2+ radial and DP pulses bilaterally. Resp: no apparent WOB, good aeration bilaterally, CTAB. Abd: +BS. Soft, NDNT, no rebound or guarding.  Ext: No edema or gross deformities. Neuro: Alert and oriented, left orbital muscle weakness,  motor 4/5 in LE's, 5/5 in RE's. Sensation intact.   Laboratory:  Recent Labs Lab 02/19/15 0422 02/20/15 0745 02/21/15 0601  WBC 6.3 7.0 6.9  HGB 9.4* 11.0* 10.3*  HCT 28.4* 32.1* 30.7*  PLT 119* 120* 124*    Recent Labs Lab 02/14/15 1604  02/19/15 0422 02/20/15 0745 02/21/15 0601  NA 137  < > 145 138 139  K 5.6*  < > 3.1* 3.7 3.9  CL 106  < > 108 106 106  CO2 15*  < > 27 20* 23  BUN 43*  < > 67* 57* 53*  CREATININE 4.28*  < > 4.27* 3.86* 3.84*  CALCIUM 8.6*  < > 7.5* 7.6* 7.8*  PROT 6.6  --   --   --   --   BILITOT 1.0  --   --   --   --   ALKPHOS 92  --   --   --   --   ALT 19  --   --   --   --   AST 36  --   --   --   --   GLUCOSE 363*  < > 143* 300* 278*  < > = values in this interval not displayed.   Imaging/Diagnostic Tests: No results found.  Mercy Riding, MD 02/21/2015, 8:17 AM PGY-1, [place] Intern pager: [phone], text pages welcome

## 2015-02-22 DIAGNOSIS — R931 Abnormal findings on diagnostic imaging of heart and coronary circulation: Secondary | ICD-10-CM

## 2015-02-22 DIAGNOSIS — I214 Non-ST elevation (NSTEMI) myocardial infarction: Secondary | ICD-10-CM

## 2015-02-22 LAB — CBC WITH DIFFERENTIAL/PLATELET
BASOS ABS: 0 10*3/uL (ref 0.0–0.1)
BASOS PCT: 0 %
Eosinophils Absolute: 0.3 10*3/uL (ref 0.0–0.7)
Eosinophils Relative: 5 %
HEMATOCRIT: 29.3 % — AB (ref 39.0–52.0)
Hemoglobin: 10.3 g/dL — ABNORMAL LOW (ref 13.0–17.0)
LYMPHS PCT: 20 %
Lymphs Abs: 1 10*3/uL (ref 0.7–4.0)
MCH: 29.2 pg (ref 26.0–34.0)
MCHC: 35.2 g/dL (ref 30.0–36.0)
MCV: 83 fL (ref 78.0–100.0)
Monocytes Absolute: 0.6 10*3/uL (ref 0.1–1.0)
Monocytes Relative: 11 %
NEUTROS ABS: 3.3 10*3/uL (ref 1.7–7.7)
Neutrophils Relative %: 64 %
PLATELETS: 128 10*3/uL — AB (ref 150–400)
RBC: 3.53 MIL/uL — AB (ref 4.22–5.81)
RDW: 12.8 % (ref 11.5–15.5)
WBC: 5.2 10*3/uL (ref 4.0–10.5)

## 2015-02-22 LAB — PROTIME-INR
INR: 1.18 (ref 0.00–1.49)
PROTHROMBIN TIME: 15.1 s (ref 11.6–15.2)

## 2015-02-22 LAB — RENAL FUNCTION PANEL
Albumin: 2.4 g/dL — ABNORMAL LOW (ref 3.5–5.0)
Anion gap: 11 (ref 5–15)
BUN: 48 mg/dL — ABNORMAL HIGH (ref 6–20)
CALCIUM: 8.2 mg/dL — AB (ref 8.9–10.3)
CHLORIDE: 106 mmol/L (ref 101–111)
CO2: 24 mmol/L (ref 22–32)
Creatinine, Ser: 3.34 mg/dL — ABNORMAL HIGH (ref 0.61–1.24)
GFR calc Af Amer: 22 mL/min — ABNORMAL LOW (ref >60)
GFR, EST NON AFRICAN AMERICAN: 19 mL/min — AB (ref >60)
Glucose, Bld: 239 mg/dL — ABNORMAL HIGH (ref 65–99)
PHOSPHORUS: 4 mg/dL (ref 2.5–4.6)
Potassium: 4.1 mmol/L (ref 3.5–5.1)
Sodium: 141 mmol/L (ref 135–145)

## 2015-02-22 LAB — GLUCOSE, CAPILLARY
GLUCOSE-CAPILLARY: 296 mg/dL — AB (ref 65–99)
GLUCOSE-CAPILLARY: 234 mg/dL — AB (ref 65–99)
Glucose-Capillary: 256 mg/dL — ABNORMAL HIGH (ref 65–99)

## 2015-02-22 LAB — APTT: APTT: 26 s (ref 24–37)

## 2015-02-22 LAB — MAGNESIUM: Magnesium: 1.9 mg/dL (ref 1.7–2.4)

## 2015-02-22 MED ORDER — CARVEDILOL 6.25 MG PO TABS
6.2500 mg | ORAL_TABLET | Freq: Two times a day (BID) | ORAL | Status: DC
  Administered 2015-02-22 (×2): 6.25 mg via ORAL
  Filled 2015-02-22 (×2): qty 1

## 2015-02-22 MED ORDER — ASPIRIN EC 325 MG PO TBEC: 325.0000 mg | DELAYED_RELEASE_TABLET | Freq: Every day | ORAL | Status: DC

## 2015-02-22 MED ORDER — ATORVASTATIN CALCIUM 40 MG PO TABS: 40.0000 mg | ORAL_TABLET | Freq: Every day | ORAL | Status: DC

## 2015-02-22 MED ORDER — CARVEDILOL 6.25 MG PO TABS: 6.2500 mg | ORAL_TABLET | Freq: Two times a day (BID) | ORAL | Status: DC

## 2015-02-22 NOTE — Progress Notes (Signed)
Inpatient Rehabilitation  I spoke with PT [facility] this am regarding pt's significant progress in therapies.  Pt. Is functioning at a modified independent level with PT and a supervision to min guard assist level with OT.  He no longer needs the intensity of IP Rehab , and PT now recommending OP services.  I have discussed with the patient and he is agreeable.  I updated [employer], CM as well as SW.  I will withdraw request for insurance authorization for rehab admission.  I am signing off.  Please call if questions.  [facility] Admissions Coordinator Cell [phone] Office [phone]

## 2015-02-22 NOTE — Progress Notes (Signed)
Patient no in room.  Patient was given discharge instruction was waiting on ride to leave explain that he needed to let us know so he could be wheeled to discharge area.  Patient has taken the walker in the room will attempt to call family about the walker

## 2015-02-22 NOTE — Progress Notes (Signed)
Occupational Therapy Treatment Patient Details Name: Russell Price MRN: [id number] DOB: [birth year] Today's Date: 02/22/2015    History of present illness Pt is a 54 y.o. male (Arabic is primary language). Admitted 9/27 with hemoptysis, later developing dyspnea and tachycardia and was intubated with copious bloody secretions. Recently also had multiple admissions for dizziness. Head CT on 9/30 was suspicious for acute infarct of anterior R thalamus, MRI was motion degraded but also suspicious for acute/ subacute right thalamic infarct. Initial CXR showed bilateral perihilar infiltrates; CXR 10/1 showed decreased bilateral airspace disease with no definite pleural effusion noted. Pt extubated 10/1.    OT comments  Pt making good progress with functional goals, requires decreased assist with ADLs and mobility. Ambulated to bathroom with no AD, only HHA. L UE AROM improving. OT will continue to follow  Follow Up Recommendations   CIR?   Equipment Recommendations    none   Recommendations for Other Services      Precautions / Restrictions Precautions Precautions: Fall Restrictions Weight Bearing Restrictions: No       Mobility Bed Mobility   Bed Mobility: Supine to Sit     Supine to sit: Supervision        Transfers Overall transfer level: Needs assistance Equipment used: 1 person hand held assist;None Transfers: Sit to/from Stand Sit to Stand: Min guard              Balance   Sitting-balance support: No upper extremity supported;Feet supported Sitting balance-Leahy Scale: Good     Standing balance support: During functional activity Standing balance-Leahy Scale: Fair                     ADL   Eating/Feeding: Set up;Sitting   Grooming: Wash/dry hands;Wash/dry face;Set up;Minimal assistance;Standing;Supervision/safety   Upper Body Bathing: Sitting;Supervision/ safety;Set up               Toilet Transfer: Min guard;Regular Toilet;Grab bars    Toileting- Clothing Manipulation and Hygiene: Supervision/safety;Sit to/from stand       Functional mobility during ADLs: Min guard General ADL Comments: no AD for mobility. Pt sat EOB for AROM of L UE in all planes x 10      Vision  no change from baseline                              Cognition   Behavior During Therapy: WFL for tasks assessed/performed Overall Cognitive Status: Within Functional Limits for tasks assessed                       Extremity/Trunk Assessment                           General Comments  pt pleasant and cooperative    Pertinent Vitals/ Pain       Pain Assessment: No/denies pain  Home Living  home with wife                                        Prior Functioning/Environment  Independent, driving, working           Frequency Min 2X/week     Progress Toward Goals  OT Goals(current goals can now be found in the care plan section)  Progress towards OT goals: Progressing  toward goals     Plan Discharge plan needs to be updated                     End of Session     Activity Tolerance Patient tolerated treatment well   Patient Left in chair;with call bell/phone within reach             Time: [id number] OT Time Calculation (min): 21 min  Charges: OT Treatments $Therapeutic Activity: 8-22 mins  Britt Bottom 02/22/2015, 10:47 AM

## 2015-02-22 NOTE — Progress Notes (Signed)
Inpatient Diabetes Program Recommendations  AACE/ADA: New Consensus Statement on Inpatient Glycemic Control (2015)  Target Ranges:  Prepandial:   less than 140 mg/dL      Peak postprandial:   less than 180 mg/dL (1-2 hours)      Critically ill patients:  140 - 180 mg/dL   Results for [name], Russell Price (MRN [id number]) as of 02/22/2015 09:04  Ref. Range 02/21/2015 07:34 02/21/2015 13:46 02/21/2015 13:58 02/21/2015 17:40 02/21/2015 21:24 02/22/2015 08:07  Glucose-Capillary Latest Ref Range: 65-99 mg/dL 253 (H) 275 (H) 266 (H) 189 (H) 200 (H) 256 (H)   Review of Glycemic Control  Diabetes history: DM 2 Outpatient Diabetes medications: Lantus 50 units, Glipizide 10 mg QAM, 5 mg QPM Current orders for Inpatient glycemic control: Novolog Moderate scale TID  Inpatient Diabetes Program Recommendations: Insulin - Basal: Glucose in 200 range. Well above inpatient goal. Note patient had hypoglycemia with 45 units of basal. Please consider starting 1/3 of patient's home dose. Lantus 15 units Q24hrs.  Thanks,  Tama Headings RN, MSN, [facility] Inpatient Diabetes Coordinator Team Pager [phone] (8a-5p)

## 2015-02-22 NOTE — Progress Notes (Signed)
Physical Therapy Treatment Patient Details Name: Russell Price MRN: [id number] DOB: [birth year] Today's Date: 02/22/2015    History of Present Illness Pt is a 54 y.o. male (Arabic is primary language). Admitted 9/27 with hemoptysis, later developing dyspnea and tachycardia and was intubated with copious bloody secretions. Recently also had multiple admissions for dizziness. Head CT on 9/30 was suspicious for acute infarct of anterior R thalamus, MRI was motion degraded but also suspicious for acute/ subacute right thalamic infarct. Initial CXR showed bilateral perihilar infiltrates; CXR 10/1 showed decreased bilateral airspace disease with no definite pleural effusion noted. Pt extubated 10/1.     PT Comments    Pt with tremendous improvement and now amb on his own. Does not need CIR and recommend return home with family and OPPT.  Follow Up Recommendations  Outpatient PT     Equipment Recommendations  None recommended by PT (TBD)    Recommendations for Other Services       Precautions / Restrictions Precautions Precautions: Fall Restrictions Weight Bearing Restrictions: No    Mobility  Bed Mobility   Bed Mobility: Supine to Sit     Supine to sit: Supervision        Transfers Overall transfer level: Modified independent Equipment used: None Transfers: Sit to/from Stand Sit to Stand: Modified independent (Device/Increase time)            Ambulation/Gait Ambulation/Gait assistance: Modified independent (Device/Increase time) Ambulation Distance (Feet): 300 Feet   Gait Pattern/deviations: Step-through pattern;Decreased step length - left Gait velocity: decr Gait velocity interpretation: Below normal speed for age/gender General Gait Details: As distance incr step length on left shortens slightly   Stairs Stairs: Yes Stairs assistance: Supervision Stair Management: One rail Right;Alternating pattern;Forwards Number of Stairs: 4    Wheelchair Mobility     Modified Rankin (Stroke Patients Only) Modified Rankin (Stroke Patients Only) Pre-Morbid Rankin Score: No symptoms Modified Rankin: Moderate disability     Balance   Sitting-balance support: No upper extremity supported;Feet supported Sitting balance-Leahy Scale: Good     Standing balance support: No upper extremity supported;During functional activity Standing balance-Leahy Scale: Good                      Cognition Arousal/Alertness: Awake/alert Behavior During Therapy: WFL for tasks assessed/performed Overall Cognitive Status: Within Functional Limits for tasks assessed                      Exercises      General Comments        Pertinent Vitals/Pain Pain Assessment: No/denies pain    Home Living                      Prior Function            PT Goals (current goals can now be found in the care plan section) Acute Rehab PT Goals Patient Stated Goal: return to home and work PT Goal Formulation: With patient/family Time For Goal Achievement: 03/01/15 Potential to Achieve Goals: Good Progress towards PT goals: Goals met and updated - see care plan    Frequency  Min 3X/week    PT Plan Discharge plan needs to be updated;Frequency needs to be updated    Co-evaluation             End of Session   Activity Tolerance: Patient tolerated treatment well Patient left: with call bell/phone within reach;with family/visitor present;in bed (sitting EOB)  Time: 1050-1058 PT Time Calculation (min) (ACUTE ONLY): 8 min  Charges:  $Gait Training: 8-22 mins                    G Codes:      Russell Price 03/03/15, 11:10 AM [employer] PT [phone]

## 2015-02-22 NOTE — Progress Notes (Signed)
Discharge instruction given to patient and spouse patient verbalize understanding. Skins intact. Dressing to left neck from removal of IJ.  Denies any pain or discomfort.  Waiting on ride to come.  Will continue to monitor

## 2015-02-22 NOTE — Care Management Note (Signed)
Case Management Note  Patient Details  Name: Russell Price MRN: [id number] Date of Birth: 04/03/1961  Subjective/Objective:                  Date-02-20-15 Monday  Patient transferred from another unit 02-19-15 Spoke with patient at the bedside along with wife.  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  Verified patient lives at home with wife.  Verified patient anticipates to go CIR at time of discharge. Patient has no DME. Expressed potential need for walker.  Patient denied needing help with their medication.  Patient drives or is driven by wife to MD appointments.  Verified patient has PCP at [facility].  Plan: CM will continue to follow for discharge planning and [facility] resources.   Carles Collet RN BSN CM [phone]     Action/Plan:  02-22-15. Patient to discharge to home with wife. Outpatient referral for PT made to [facility]. Patient given map with contact info and instructions to call if he is not contacted in three days to schedule an appointment.   Expected Discharge Date:                  Expected Discharge Plan:  Home/Self Care  In-House Referral:     Discharge planning Services  CM Consult  Post Acute Care Choice:    Choice offered to:     DME Arranged:    DME Agency:     HH Arranged:    [place] Agency:     Status of Service:  Completed, signed off  Medicare Important Message Given:    Date Medicare IM Given:    Medicare IM give by:    Date Additional Medicare IM Given:    Additional Medicare Important Message give by:     If discussed at [place] of Stay Meetings, dates discussed:    Additional Comments:  Carles Collet, RN 02/22/2015, 12:23 PM

## 2015-02-22 NOTE — Progress Notes (Signed)
Family Medicine Teaching Service Daily Progress Note Intern Pager: [phone]  Patient name: Russell Price Medical record number: [id number] Date of birth: [birth year] Age: 54 y.o. Gender: male  Primary Care Provider: No PCP Per Patient Consultants: card, neuro, CCM,  Code Status: full  Pt Overview and Major Events to Date:  9/27 - Admitted to hospital 9/27 - Intubated & central line placed. Attempted BAL. 9/28 - BAL by Hyman Bible & Nephrology consulted 9/30 - Neurology consult 10/01Shella Spearing, nephrology consult 10/3 - ICU to  Floor, Card consult 10/04- MRI brain, LHC  Assessment and Plan: Newly diagnosed CHF: Echo on 9/28 w/ mild hypokinesis, EF 45-50%. Echo on 10/03 with EF 55-60% -Appreciate card recs  -Lexscan Myoview  -unlikely we will pursue coronary angio due to his comorbidities  -coreg 6.25 mg bid  -30 days event monitor after discharge  Hypertensive emergency- resolved. Elevated troponin (trop 3 --> 5.5 --> 11.78 --> 8.2, TWI in inferolateral leads) while in ICU thought to be demand process. Still hypertensive requiring prn hydralazine. Started Amlodipine 10 mg daily on 10/03.  SBP 153-179. -Continue Amlodipine.  -coreg as above -will consider adding losartan when renal function is better  AMS/hx of CVA: AMS resolved. Infarct in anterior right thalamus , MRI with ill defined region right thalamus may represent acute/subacute Infarct . MRA w/ mild to mod narrowing w/ no high grade stenosis. Left carotid doppler 1-39% steosis. Not able to assess right due to IJ line. TTE bubble study without PFO. PVL ext negative for DVT. -Appreciate neuro recs -no evidence of PFO on transcranial doppler with Bubbles. -continue Lipitor  -continue ASA -PT recs: 4x/week -Passed SLP -Met CIR admission criteria pending insurance prior auth  H/O DM-2: A1c 8.5 down from 10.3 three months ago. On Lantus 50 units at home. Had Hypoglycemic evens on 10/1 that has resolved. CBG 253 this AM. On Novolog  SSI with meals. -Consider introducing Lantus at low dose -Accu-Cheks every 4 hours  -Continue SSI -Will consider restarting home Lantus   AoCKD with AGMA - baseline Cr seems to be 2.5. sCr 4.28 on admission>5.9>>>3.58 on 10/05. Anion Gap metabolic Acidosis - resolved -Wt 4 lbs up from admission. -Following Electrolytes daily  -Nephrology following -K+ replaced   Hyperkalemia - resolved. K 3.7  Normocytic Anemia - baseline Hgb seems 11. Hgb 11>10.3 today. Likely secondary to CKD. -H&H stable -follow CBC  Acute hypoxic respiratory failure - resolved. SOB and hemoptysis on presentation. CXR showed R>L edema. He was intubated in ED for airway protection in setting of copious bloody secretions. Admitted to ICU. Extubated on 10/01. DAH vs Pneumonia. Diffuse alveolar hemorrhage - (ANA, RF, CCP, GBM, ANCA,MPO, & PR3 negative). Works at a [employer] at [employer]. Reports exposure to chemical but uses face mask. Lung exam with no wheeze or crackles. No work of breathing. Sating 99-100 on RA. BCx2  HCAP vs atypical pneumonia. With Leukocytosis that has resolved. Viral resp panel, U strep, U legionella, BAL 9/28,  AFB 9/28, smear, fungal cx, cytology, Sputum BAL ,Oral flora. AFB stain and cultures & smear negative.  -Stopping antibiotics today -Zosyn, start date 9/27-10/03 -Vancomycin, start date 9/27-10/03  Access: right IJ and peripheral IV -will discontinue right IJ today   Disposition:possible discharge today as patient is well enough for CIR now.  Subjective:  Saw patient this morning. He was lying in the bed with wife sitting at bedside. He reports sleeping well. Denies pain, SOB, or chest pain.   Objective: Temp:  [98.5 F (36.9  C)-99.1 F (37.3 C)] 98.5 F (36.9 C) (10/05 0543) Pulse Rate:  [65-102] 69 (10/05 0543) Resp:  [16-24] 16 (10/05 0543) BP: (145-173)/(55-78) 154/78 mmHg (10/05 0543) SpO2:  [97 %-100 %] 100 % (10/05 0543) Weight:  [173 lb 3.2 oz (78.563 kg)] 173 lb 3.2  oz (78.563 kg) (10/05 0546) Physical Exam: Gen: NAD, well-appearing  Eyes: cataract in right eye,  no conjunctival injection. Nares: clear, no erythema, swelling or congestion Oropharynx: clear, moist CV: RRR. S1 & S2 audible, no murmurs, rubs. 2+ radial and DP pulses bilaterally. Resp: no apparent WOB, good aeration bilaterally, CTAB. Abd: +BS. Soft, NDNT, no rebound or guarding.  Ext: No edema or gross deformities. Neuro: Alert and oriented, left orbital muscle weakness,  motor 4/5 in LE's, 5/5 in RE's. Sensation intact.   Laboratory:  Recent Labs Lab 02/20/15 0745 02/21/15 0601 02/22/15 0624  WBC 7.0 6.9 5.2  HGB 11.0* 10.3* 10.3*  HCT 32.1* 30.7* 29.3*  PLT 120* 124* 128*    Recent Labs Lab 02/20/15 0745 02/21/15 0601 02/21/15 1352  NA 138 139 139  K 3.7 3.9 3.9  CL 106 106 106  CO2 20* 23 24  BUN 57* 53* 48*  CREATININE 3.86* 3.84* 3.58*  CALCIUM 7.6* 7.8* 7.8*  GLUCOSE 300* 278* 290*     Imaging/Diagnostic Tests: Nm Myocar Multi W/spect W/wall Motion / Ef  02/21/2015    T wave inversion was noted during stress in the II, III, aVF, V6 and V5  leads.  Defect 1: There is a small defect of moderate severity present in the  basal inferior and mid inferior location that is fixed and consistent with  prior scar.  Defect 2: There is a small defect of moderate severity present in the  apical inferior location consistent with ischemia.  Defect 3: There is a small defect of mild severity that is partially  reversible in the basal inferolateral and mid inferolateral location  consistent with scar and peri- infarct ischemia.  Defect 4: There is a small defect of mild severity present in the basal  anterior, mid anterior and apex location worrisome for ischemia.  This is a high risk study.  Nuclear stress EF: 35%.  The left ventricular ejection fraction is moderately decreased (30-44%).     Mercy Riding, MD 02/22/2015, 7:27 AM PGY-1, [place]  Intern pager: [phone], text pages welcome

## 2015-02-22 NOTE — Progress Notes (Signed)
Speech Language Pathology Treatment: Dysphagia  Patient Details Name: Russell Price MRN: [id number] DOB: 03-Apr-1961 Today's Date: 02/22/2015 Time: 7915-0569 SLP Time Calculation (min) (ACUTE ONLY): 8 min  Assessment / Plan / Recommendation Clinical Impression  Brief observation revealing a normal oral and pharyngeal swallow pattern (just completed breakfast). Pt denied recent coughing/difficulty with po's. No s/s aspiration observed and no cueing required. Recommend continue regular diet/thin liquids, d/c ST.   HPI Other Pertinent Information: Pt is a 54 y.o. male (Arabic is primary language, reportedly English is "ok"). Admitted 9/27 with hemoptysis, later developing dyspnea and tachycardia and was intubated with copious bloody secretions. Recently also had multiple admissions for dizziness. Head CT on 9/30 was suspicious for acute infarct of anterior R thalamus, MRI was ordered. Initial CXR showed bilateral perihilar infiltrates; CXR 10/1 showed decreased bilateral airspace disease with no definite pleural effusion noted. Bedside swallow eval ordered due to prolonged intubation- extubated 10/1.   Pertinent Vitals Pain Assessment: No/denies pain  SLP Plan  Discharge SLP treatment due to (comment);All goals met    Recommendations Diet recommendations: Regular;Thin liquid Liquids provided via: Cup;Straw Medication Administration: Whole meds with liquid Supervision: Patient able to self feed Compensations: Slow rate;Small sips/bites Postural Changes and/or Swallow Maneuvers: Seated upright 90 degrees              Oral Care Recommendations: Oral care BID Follow up Recommendations: None Plan: Discharge SLP treatment due to (comment);All goals met         [name] 02/22/2015, 9:14 AM  [name] Colvin Caroli.Ed [employer] [phone]

## 2015-02-22 NOTE — Discharge Instructions (Addendum)
It is nice taking care of you! You were admitted with bleeding in your lung and stroke. It is unclear what caused these. However, your lung has recovered well. For your stroke, you need some physical therapy to regain some of your strength back. We have set a referral to outpatient physical therapy. They will call and set up a schedule with you.  During this hospitalizations we have started you on some medication and we will be discharging you on some of those. Please, read under medication section for the names and directions.  You also have a follow up with your primary care physician that you need to go to in two days. You do not need to call. You can walk in anytime between 8 and 4 pm.  Take care,

## 2015-02-22 NOTE — Progress Notes (Signed)
Subjective: No CP or SOB  Objective: Vital signs in last 24 hours: Temp:  [98.5 F (36.9 C)-99.1 F (37.3 C)] 98.5 F (36.9 C) (10/05 0543) Pulse Rate:  [65-102] 69 (10/05 0543) Resp:  [16-24] 16 (10/05 0543) BP: (145-173)/(55-78) 154/78 mmHg (10/05 0543) SpO2:  [97 %-100 %] 100 % (10/05 0543) Weight:  [78.563 kg (173 lb 3.2 oz)] 78.563 kg (173 lb 3.2 oz) (10/05 0546) Last BM Date: 02/19/15  Intake/Output from previous day:   Intake/Output this shift:    Medications Scheduled Meds: .  stroke: mapping our early stages of recovery book   Does not apply Once  . amLODipine  10 mg Oral Daily  . antiseptic oral rinse  7 mL Mouth Rinse q12n4p  . aspirin  325 mg Oral Daily  . atorvastatin  40 mg Oral q1800  . chlorhexidine  15 mL Mouth Rinse BID  . insulin aspart  0-15 Units Subcutaneous TID AC & HS  . sodium chloride  3 mL Intravenous Q12H   Continuous Infusions:  PRN Meds:.sodium chloride, sodium chloride, hydrALAZINE, sodium chloride  PE: General appearance: alert, cooperative and no distress Lungs: Decreased BS throughout. Mild crackles.   Heart: regular rate and rhythm, S1, S2 normal, no murmur, click, rub or gallop Extremities: No LEE Pulses: 2+ and symmetric Skin: Warm and dry Neurologic: Grossly normal  Lab Results:   Recent Labs  02/20/15 0745 02/21/15 0601 02/22/15 0624  WBC 7.0 6.9 5.2  HGB 11.0* 10.3* 10.3*  HCT 32.1* 30.7* 29.3*  PLT 120* 124* 128*   BMET  Recent Labs  02/21/15 0601 02/21/15 1352 02/22/15 0624  NA 139 139 141  K 3.9 3.9 4.1  CL 106 106 106  CO2 23 24 24   GLUCOSE 278* 290* 239*  BUN 53* 48* 48*  CREATININE 3.84* 3.58* 3.34*  CALCIUM 7.8* 7.8* 8.2*   PT/INR  Recent Labs  02/20/15 0745 02/21/15 0601 02/22/15 0624  LABPROT 14.6 15.4* 15.1  INR 1.12 1.20 1.18   Lipid Panel     Component Value Date/Time   CHOL 191 02/18/2015 0344   TRIG 290* 02/18/2015 0344   HDL 23* 02/18/2015 0344   CHOLHDL 8.3 02/18/2015  0344   VLDL 58* 02/18/2015 0344   LDLCALC 110* 02/18/2015 0344      Assessment/Plan  Principal Problem:   Acute respiratory failure with hypoxemia (HCC) Active Problems:   ARDS (adult respiratory distress syndrome) (HCC)   Hemoptysis   Cerebral thrombosis with cerebral infarction (HCC)   Acute kidney injury (HCC)   LV dysfunction  SP lexiscan cardiolite stress test.  Despite the "High Risk" interpretation, Dr. Sallyanne Kuster also reviewed nuclear images and felt the images were more consistent with low risk, inferior wall ischemia.  EF 55-60% with normal wall motion. Severe LVH.  G1DD.  Troponin peaked at 11.78.   He is not a candidate for cath due to CVA, pulmonary hemorrhage and elevated SCr.   He is on a statin and ASA.  Recommend adding a low dose beta blocker.  Hgb stable.  No concerning rhythms on tele.  30 day monitor once DCd.  BP is not well controlled    LOS: 8 days    HAGER, BRYAN PA-C 02/22/2015 8:50 AM  I have seen and examined the patient along with Tarri Fuller, PA.  I have reviewed the chart, notes and new data.  I agree with PA's note.  Key new complaints: no angina or dyspnea Key examination changes: no clinical signs of  CHF Key new findings / data: I have reviewed the nuclear images myself. I believe the inferior defect is real and there clearly is a small area of ischemia, but the basal anterior defect is probably an artifact. The LV dysfunction is quite different from the normal LVEF on echo, and is likely an underestimation due to mis-gating.  PLAN: While it is very likely that this patient does have CAD, I believe that the overall area of LV myocardium in jeopardy is low. In the setting of acute renal failure, recent pulmonary hemorrhage and recent (embolic?) stroke, the risk of serious complications with diagnostic coronary angiography is high and outweighs the potential benefit may times over. If CAD were identified, percutaneous revascularization (which would  commit him to long term dual antiplatelet therapy) could be associated with catastrophic complications should alveolar hemorrhage recur.  Medical therapy is therefore preferred in this patient without angina and with normal LV systolic function. Seems to be tolerating ASA without bleeding. Would not add plavix. Statin with target LDL<70.  Hgb A1c<7%. BP target preferably in 120-130/70-80 mm Hg range, using carvedilol preferentially (ARB if renal function improves). Reassess need for coronary angio if renal function recovers, preferably > 4-6 weeks after stroke.  Sanda Klein, MD, [place] [phone] 02/22/2015, 9:38 AM

## 2015-02-27 ENCOUNTER — Ambulatory Visit (INDEPENDENT_AMBULATORY_CARE_PROVIDER_SITE_OTHER): Payer: BLUE CROSS/BLUE SHIELD | Admitting: Family Medicine

## 2015-02-27 VITALS — BP 150/80 | HR 53 | Temp 98.1°F | Resp 18 | Ht 71.0 in | Wt 172.0 lb

## 2015-02-27 DIAGNOSIS — N183 Type 2 diabetes mellitus with diabetic chronic kidney disease: Secondary | ICD-10-CM

## 2015-02-27 DIAGNOSIS — IMO0002 Reserved for concepts with insufficient information to code with codable children: Secondary | ICD-10-CM

## 2015-02-27 DIAGNOSIS — R739 Hyperglycemia, unspecified: Secondary | ICD-10-CM | POA: Diagnosis not present

## 2015-02-27 DIAGNOSIS — E1122 Type 2 diabetes mellitus with diabetic chronic kidney disease: Secondary | ICD-10-CM

## 2015-02-27 DIAGNOSIS — Z794 Long term (current) use of insulin: Secondary | ICD-10-CM

## 2015-02-27 DIAGNOSIS — I252 Old myocardial infarction: Secondary | ICD-10-CM | POA: Diagnosis not present

## 2015-02-27 DIAGNOSIS — R0600 Dyspnea, unspecified: Secondary | ICD-10-CM

## 2015-02-27 LAB — POCT CBC
GRANULOCYTE PERCENT: 70.5 % (ref 37–80)
HCT, POC: 29.5 % — AB (ref 43.5–53.7)
Hemoglobin: 9.7 g/dL — AB (ref 14.1–18.1)
Lymph, poc: 1.2 (ref 0.6–3.4)
MCH: 28.1 pg (ref 27–31.2)
MCHC: 33 g/dL (ref 31.8–35.4)
MCV: 85 fL (ref 80–97)
MID (cbc): 0.3 (ref 0–0.9)
MPV: 9.5 fL (ref 0–99.8)
PLATELET COUNT, POC: 145 10*3/uL (ref 142–424)
POC Granulocyte: 3.5 (ref 2–6.9)
POC LYMPH %: 24.1 % (ref 10–50)
POC MID %: 5.4 %M (ref 0–12)
RBC: 3.46 M/uL — AB (ref 4.69–6.13)
RDW, POC: 13.2 %
WBC: 5 10*3/uL (ref 4.6–10.2)

## 2015-02-27 LAB — COMPLETE METABOLIC PANEL WITH GFR
ALT: 30 U/L (ref 9–46)
AST: 12 U/L (ref 10–35)
Albumin: 3.4 g/dL — ABNORMAL LOW (ref 3.6–5.1)
Alkaline Phosphatase: 108 U/L (ref 40–115)
BUN: 45 mg/dL — AB (ref 7–25)
CHLORIDE: 92 mmol/L — AB (ref 98–110)
CO2: 22 mmol/L (ref 20–31)
Calcium: 8.6 mg/dL (ref 8.6–10.3)
Creat: 3.03 mg/dL — ABNORMAL HIGH (ref 0.70–1.33)
GFR, Est African American: 25 mL/min — ABNORMAL LOW (ref >=60)
GFR, Est Non African American: 22 mL/min — ABNORMAL LOW (ref >=60)
GLUCOSE: 854 mg/dL — AB (ref 65–99)
POTASSIUM: 5.2 mmol/L (ref 3.5–5.3)
SODIUM: 124 mmol/L — AB (ref 135–146)
Total Bilirubin: 0.5 mg/dL (ref 0.2–1.2)
Total Protein: 6.3 g/dL (ref 6.1–8.1)

## 2015-02-27 LAB — POCT URINALYSIS DIP (MANUAL ENTRY)
BILIRUBIN UA: NEGATIVE
Ketones, POC UA: NEGATIVE
LEUKOCYTES UA: NEGATIVE
NITRITE UA: NEGATIVE
PH UA: 5.5
Protein Ur, POC: 100 — AB
Spec Grav, UA: 1.01
Urobilinogen, UA: 0.2

## 2015-02-27 LAB — GLUCOSE, POCT (MANUAL RESULT ENTRY): POC Glucose: >444 mg/dl (ref 70–99)

## 2015-02-27 MED ORDER — INSULIN ASPART 100 UNIT/ML ~~LOC~~ SOLN: 20.0000 [IU] | Freq: Once | SUBCUTANEOUS | Status: DC

## 2015-02-27 NOTE — Progress Notes (Signed)
Patient ID: Russell Price, male    DOB: 1961-05-06  Age: 54 y.o. MRN: [id number]  Chief Complaint  Patient presents with  . Follow-up    Diabetes and dizziness    Subjective:   54 year old man who was in the hospital until 4 days ago. He apparently went in with dizziness. He had severely elevated blood glucose, had a stroke and had evidence for a heart attack. He had renal insufficiency and was hydrated. Apparently he was critically ill when he went in, had central lines. He's doing better. He is home now. Has felt better each day at home. He is not been taking his Lantus. He takes his other oral medicines. His blood sugar meters not been registering his sugars. He is not hurting now. He still little dizzy.  His wife was [place] when he got sick, but his back with him now. She said he looked very bad when she got to the hospital.   His review of systems was otherwise fairly unremarkable. He is blind in his right eye.    Current allergies, medications, problem list, past/family and social histories reviewed.  Objective:  BP 150/80 mmHg  Pulse 53  Temp(Src) 98.1 F (36.7 C) (Oral)  Resp 18  Ht 5\' 11"  (1.803 m)  Wt 172 lb (78.019 kg)  BMI 24.00 kg/m2  SpO2 98%  Physical exam reveals an alert man. Week. Throat clear. Neck supple without nodes. No carotid bruits. Chest is clear to all station. Heart regular without murmur scalp arrhythmias. Abdomen soft without mass or tenderness. Extremities without edema. Has a corneal opacity in the right eye.   Results for orders placed or performed in visit on 02/27/15  POCT glucose (manual entry)  Result Value Ref Range   POC Glucose >444 70 - 99 mg/dl  POCT CBC  Result Value Ref Range   WBC 5.0 4.6 - 10.2 K/uL   Lymph, poc 1.2 0.6 - 3.4   POC LYMPH PERCENT 24.1 10 - 50 %L   MID (cbc) 0.3 0 - 0.9   POC MID % 5.4 0 - 12 %M   POC Granulocyte 3.5 2 - 6.9   Granulocyte percent 70.5 37 - 80 %G   RBC 3.46 (A) 4.69 - 6.13 M/uL   Hemoglobin 9.7  (A) 14.1 - 18.1 g/dL   HCT, POC 29.5 (A) 43.5 - 53.7 %   MCV 85.0 80 - 97 fL   MCH, POC 28.1 27 - 31.2 pg   MCHC 33.0 31.8 - 35.4 g/dL   RDW, POC 13.2 %   Platelet Count, POC 145 142 - 424 K/uL   MPV 9.5 0 - 99.8 fL  POCT urinalysis dipstick  Result Value Ref Range   Color, UA yellow yellow   Clarity, UA clear clear   Glucose, UA =500 (A) negative   Bilirubin, UA negative negative   Ketones, POC UA negative negative   Spec Grav, UA 1.010    Blood, UA trace-lysed (A) negative   pH, UA 5.5    Protein Ur, POC =100 (A) negative   Urobilinogen, UA 0.2    Nitrite, UA Negative Negative   Leukocytes, UA Negative Negative    Assessment & Plan:   Assessment: 1. Type 2 diabetes mellitus with stage 3 chronic kidney disease, with long-term current use of insulin ([place])   2. History of myocardial infarction less than 8 weeks   3. CKD (chronic kidney disease) stage 3, GFR 30-59 ml/min   4. Dyspnea   5. Hyperglycemia  Plan:  Insulin 20 units here in the office. He doesn't look bad considering everything. We'll see him back in 2 days   EKG has bradycardia, T-wave inversions in 2, aVL, aVF. A little ST elevation which is primarily J-wire elevation in V2. Voltage for LVH.    Orders Placed This Encounter  Procedures  . COMPLETE METABOLIC PANEL WITH GFR  . Brain natriuretic peptide  . POCT glucose (manual entry)  . POCT CBC  . POCT urinalysis dipstick  . EKG 12-Lead    Meds ordered this encounter  Medications  . insulin aspart (novoLOG) injection 20 Units    Sig:          Patient Instructions  Take the insulin Lantus 50 units every evening  If your blood sugar is still very high by Wednesday, not able to get a reading on your meter, please come in.  If you feel worse at anytime go to the emergency room  Take the diabetic pill, glipizide 5 mg, 2 pills twice daily at breakfast and supper  Drink lots of water. That will help you dizziness some.  We are giving  you 20 units of NovoLog insulin now, and I want you to take the syringe home with you. If your sugar is still too high to measure or greater than 350   tomorning take 20 units of that in the morning also.     Return in about 2 days (around 03/01/2015).   Kaneisha Ellenberger, MD 02/27/2015

## 2015-02-27 NOTE — Patient Instructions (Addendum)
Take the insulin Lantus 50 units every evening  If your blood sugar is still very high by Wednesday, not able to get a reading on your meter, please come in.  If you feel worse at anytime go to the emergency room  Take the diabetic pill, glipizide 5 mg, 2 pills twice daily at breakfast and supper  Drink lots of water. That will help you dizziness some.  We are giving you 20 units of NovoLog insulin now, and I want you to take the syringe home with you. If your sugar is still too high to measure or greater than 350   tomorning take 20 units of that in the morning also.

## 2015-02-28 ENCOUNTER — Telehealth: Payer: Self-pay | Admitting: Emergency Medicine

## 2015-02-28 ENCOUNTER — Emergency Department (HOSPITAL_COMMUNITY)
Admission: EM | Admit: 2015-02-28 | Discharge: 2015-02-28 | Disposition: A | Discharge status: Home / Self Care | Payer: BLUE CROSS/BLUE SHIELD | Attending: Emergency Medicine | Admitting: Emergency Medicine

## 2015-02-28 ENCOUNTER — Encounter (HOSPITAL_COMMUNITY): Payer: Self-pay | Admitting: Emergency Medicine

## 2015-02-28 ENCOUNTER — Ambulatory Visit (INDEPENDENT_AMBULATORY_CARE_PROVIDER_SITE_OTHER): Payer: BLUE CROSS/BLUE SHIELD | Admitting: Family Medicine

## 2015-02-28 VITALS — BP 140/62 | HR 52 | Temp 98.0°F | Resp 18 | Ht 70.0 in | Wt 173.2 lb

## 2015-02-28 DIAGNOSIS — E1165 Type 2 diabetes mellitus with hyperglycemia: Secondary | ICD-10-CM | POA: Insufficient documentation

## 2015-02-28 DIAGNOSIS — Z794 Long term (current) use of insulin: Secondary | ICD-10-CM | POA: Insufficient documentation

## 2015-02-28 DIAGNOSIS — I509 Heart failure, unspecified: Secondary | ICD-10-CM | POA: Diagnosis not present

## 2015-02-28 DIAGNOSIS — I10 Essential (primary) hypertension: Secondary | ICD-10-CM

## 2015-02-28 DIAGNOSIS — Z79899 Other long term (current) drug therapy: Secondary | ICD-10-CM | POA: Insufficient documentation

## 2015-02-28 DIAGNOSIS — I129 Hypertensive chronic kidney disease with stage 1 through stage 4 chronic kidney disease, or unspecified chronic kidney disease: Secondary | ICD-10-CM | POA: Diagnosis not present

## 2015-02-28 DIAGNOSIS — Z87891 Personal history of nicotine dependence: Secondary | ICD-10-CM | POA: Insufficient documentation

## 2015-02-28 DIAGNOSIS — R739 Hyperglycemia, unspecified: Secondary | ICD-10-CM

## 2015-02-28 DIAGNOSIS — N189 Chronic kidney disease, unspecified: Secondary | ICD-10-CM | POA: Diagnosis not present

## 2015-02-28 DIAGNOSIS — Z7982 Long term (current) use of aspirin: Secondary | ICD-10-CM | POA: Diagnosis not present

## 2015-02-28 DIAGNOSIS — N184 Chronic kidney disease, stage 4 (severe): Secondary | ICD-10-CM | POA: Diagnosis not present

## 2015-02-28 DIAGNOSIS — E118 Type 2 diabetes mellitus with unspecified complications: Secondary | ICD-10-CM | POA: Diagnosis not present

## 2015-02-28 LAB — BRAIN NATRIURETIC PEPTIDE: Brain Natriuretic Peptide: 1114.8 pg/mL — ABNORMAL HIGH (ref 0.0–100.0)

## 2015-02-28 LAB — URINALYSIS, ROUTINE W REFLEX MICROSCOPIC
BILIRUBIN URINE: NEGATIVE
Glucose, UA: 250 mg/dL — AB
KETONES UR: NEGATIVE mg/dL
Leukocytes, UA: NEGATIVE
NITRITE: NEGATIVE
PH: 5 (ref 5.0–8.0)
Protein, ur: 100 mg/dL — AB
Specific Gravity, Urine: 1.014 (ref 1.005–1.030)
Urobilinogen, UA: 0.2 mg/dL (ref 0.0–1.0)

## 2015-02-28 LAB — CBC
HCT: 26.3 % — ABNORMAL LOW (ref 39.0–52.0)
Hemoglobin: 9.6 g/dL — ABNORMAL LOW (ref 13.0–17.0)
MCH: 28.7 pg (ref 26.0–34.0)
MCHC: 36.5 g/dL — ABNORMAL HIGH (ref 30.0–36.0)
MCV: 78.7 fL (ref 78.0–100.0)
PLATELETS: 200 10*3/uL (ref 150–400)
RBC: 3.34 MIL/uL — ABNORMAL LOW (ref 4.22–5.81)
RDW: 12.8 % (ref 11.5–15.5)
WBC: 5.6 10*3/uL (ref 4.0–10.5)

## 2015-02-28 LAB — BASIC METABOLIC PANEL
Anion gap: 10 (ref 5–15)
BUN: 47 mg/dL — AB (ref 6–20)
CALCIUM: 8.8 mg/dL — AB (ref 8.9–10.3)
CHLORIDE: 94 mmol/L — AB (ref 101–111)
CO2: 25 mmol/L (ref 22–32)
CREATININE: 3.69 mg/dL — AB (ref 0.61–1.24)
GFR calc Af Amer: 20 mL/min — ABNORMAL LOW (ref >60)
GFR, EST NON AFRICAN AMERICAN: 17 mL/min — AB (ref >60)
Glucose, Bld: 361 mg/dL — ABNORMAL HIGH (ref 65–99)
Potassium: 4.6 mmol/L (ref 3.5–5.1)
SODIUM: 129 mmol/L — AB (ref 135–145)

## 2015-02-28 LAB — URINE MICROSCOPIC-ADD ON

## 2015-02-28 LAB — CBG MONITORING, ED
GLUCOSE-CAPILLARY: 277 mg/dL — AB (ref 65–99)
GLUCOSE-CAPILLARY: 332 mg/dL — AB (ref 65–99)
Glucose-Capillary: 368 mg/dL — ABNORMAL HIGH (ref 65–99)

## 2015-02-28 MED ORDER — SODIUM CHLORIDE 0.9 % IV BOLUS (SEPSIS)
1000.0000 mL | Freq: Once | INTRAVENOUS | Status: AC
  Administered 2015-02-28: 1000 mL via INTRAVENOUS

## 2015-02-28 MED ORDER — INSULIN ASPART 100 UNIT/ML ~~LOC~~ SOLN
10.0000 [IU] | Freq: Once | SUBCUTANEOUS | Status: AC
  Administered 2015-02-28: 10 [IU] via INTRAVENOUS
  Filled 2015-02-28: qty 1

## 2015-02-28 MED ORDER — INSULIN GLARGINE 100 UNIT/ML ~~LOC~~ SOLN
50.0000 [IU] | Freq: Once | SUBCUTANEOUS | Status: AC
  Administered 2015-02-28: 50 [IU] via SUBCUTANEOUS
  Filled 2015-02-28: qty 0.5

## 2015-02-28 NOTE — ED Provider Notes (Addendum)
CSN: [id number]     Arrival date & time 02/28/15  0016 History  By signing my name below, I, Meriel Pica, attest that this documentation has been prepared under the direction and in the presence of Merryl Hacker, MD. Electronically Signed: Meriel Pica, ED Scribe. 02/28/2015. 2:31 AM.   Chief Complaint  Patient presents with  . Hyperglycemia    The history is provided by the patient. No language interpreter was used.   HPI Comments: Russell Price is a 54 y.o. male, with a PMhx of DM, HTN, and CKD,  who presents to the Emergency Department complaining of hyperglycemia reading at 800 CBG. The pt was called by his PCP office this evening and advised to come to the ED after being evaluated at PCP office today for a follow up visit. Pt takes 50 units of insulin at night. He missed his dose of insulin this evening but has otherwise been compliant with his medication. Pt does not check his BG at home. The pt was admitted 2 weeks ago for acute respiratory failure, NSTEMI, and CVA. He denies any symptoms of CP, abdominal pain, or dysuria.  He denies any physical complaints at this time.   Past Medical History  Diagnosis Date  . Diabetes mellitus without complication ([place])     followed by Dr Chalmers Cater  . Hypertension     followed by [facility]  . Chronic kidney disease    Past Surgical History  Procedure Laterality Date  . Bronchoscopy  02/14/2015    for pulm hemorrhage   Family History  Problem Relation Age of Onset  . Diabetes Mother    Social History  Substance Use Topics  . Smoking status: Former Smoker -- 0.00 packs/day for 4 years    Quit date: 02/23/1983  . Smokeless tobacco: None  . Alcohol Use: No    Review of Systems  Constitutional: Negative.  Negative for fever.  Respiratory: Negative.  Negative for chest tightness and shortness of breath.   Cardiovascular: Negative.  Negative for chest pain.  Gastrointestinal: Negative.  Negative for abdominal  pain.  Genitourinary: Negative.  Negative for dysuria.  Skin: Negative for rash.  Neurological: Negative for headaches.  All other systems reviewed and are negative.  Allergies  Review of patient's allergies indicates no known allergies.  Home Medications   Prior to Admission medications   Medication Sig Start Date End Date Taking? Authorizing Provider  amLODipine (NORVASC) 10 MG tablet Take 1 tablet (10 mg total) by mouth daily. Stop lisinopril . 11/09/14  Yes Thao P Le, DO  aspirin EC 325 MG tablet Take 1 tablet (325 mg total) by mouth daily. 02/22/15  Yes Mercy Riding, MD  atorvastatin (LIPITOR) 40 MG tablet Take 1 tablet (40 mg total) by mouth daily at 6 PM. 02/22/15  Yes Mercy Riding, MD  carvedilol (COREG) 6.25 MG tablet Take 1 tablet (6.25 mg total) by mouth 2 (two) times daily with a meal. 02/22/15  Yes Mercy Riding, MD  glipiZIDE (GLUCOTROL) 5 MG tablet Take 2 pills in the morning and one in the evening for diabetes 02/03/15  Yes Posey Boyer, MD  Insulin Glargine (LANTUS SOLOSTAR) 100 UNIT/ML Solostar Pen Inject 50 units subcutaneously at bedtime. 02/12/15  Yes Jola Schmidt, MD   BP 146/81 mmHg  Pulse 55  Temp(Src) 98.3 F (36.8 C) (Oral)  Resp 16  Ht 5\' 11"  (1.803 m)  Wt 172 lb 8 oz (78.245 kg)  BMI 24.07 kg/m2  SpO2 100% Physical Exam  Constitutional: He is oriented to person, place, and time. No distress.  HENT:  Head: Normocephalic and atraumatic.  Mucous membranes dry  Cardiovascular: Normal rate, regular rhythm and normal heart sounds.   No murmur heard. Pulmonary/Chest: Effort normal and breath sounds normal. No respiratory distress. He has no wheezes.  Abdominal: Soft. There is no tenderness.  Musculoskeletal: He exhibits no edema.  Neurological: He is alert and oriented to person, place, and time.  Skin: Skin is warm and dry.  Psychiatric: He has a normal mood and affect.  Nursing note and vitals reviewed.   ED Course  Procedures  DIAGNOSTIC  STUDIES: Oxygen Saturation is 100% on RA, normal by my interpretation.    COORDINATION OF CARE: 2:28 AM Discussed treatment plan with pt at bedside and pt agreed to plan.   Labs Review Labs Reviewed  BASIC METABOLIC PANEL - Abnormal; Notable for the following:    Sodium 129 (*)    Chloride 94 (*)    Glucose, Bld 361 (*)    BUN 47 (*)    Creatinine, Ser 3.69 (*)    Calcium 8.8 (*)    GFR calc non Af Amer 17 (*)    GFR calc Af Amer 20 (*)    All other components within normal limits  CBC - Abnormal; Notable for the following:    RBC 3.34 (*)    Hemoglobin 9.6 (*)    HCT 26.3 (*)    MCHC 36.5 (*)    All other components within normal limits  URINALYSIS, ROUTINE W REFLEX MICROSCOPIC (NOT AT [facility]) - Abnormal; Notable for the following:    APPearance CLOUDY (*)    Glucose, UA 250 (*)    Hgb urine dipstick TRACE (*)    Protein, ur 100 (*)    All other components within normal limits  URINE MICROSCOPIC-ADD ON - Abnormal; Notable for the following:    Casts HYALINE CASTS (*)    All other components within normal limits  CBG MONITORING, ED - Abnormal; Notable for the following:    Glucose-Capillary 332 (*)    All other components within normal limits  CBG MONITORING, ED - Abnormal; Notable for the following:    Glucose-Capillary 368 (*)    All other components within normal limits    I have personally reviewed and evaluated these lab results as part of my medical decision-making.    MDM   Final diagnoses:  Hyperglycemia    Patient presents with hyperglycemia. Is nontoxic on exam. Is otherwise asymptomatic. Noted to have a glucose greater than 800 at clinic earlier today. No evidence of anion gap. Patient has not taken his nightly Lantus. He has not taken anything since being discharged from clinic but was given 20 units of insulin in clinic. Glucose upon arrival was 332. He has had a recent copy dictated hospital course within in STEMI and a stroke. Initial lab work here is  reassuring. Repeat BMP with a sodium of 129, glucose of 361. Patient was given fluids. Repeat glucose 368. He was given IV insulin and a second liter of fluids. Will discuss with family medicine. While patient does not have any evidence of anion gap, given recent hospitalization and glucose of greater than 800 earlier today, would favor admitting patient for observation for glucose stabilization.  I personally performed the services described in this documentation, which was scribed in my presence. The recorded information has been reviewed and is accurate.    Merryl Hacker,  MD 02/28/15 0355  Discussed with family medicine resident. Given absence of anion gap, they have requested further management in the ER with fluids and additional insulin. If at that time, his glucose does not respond, they will evaluate for admission. This is reasonable. Discussed this with the patient and his wife. He was given additional fluids and repeat glucose 277. Patient was also given his daily Lantus dose which she did not take last night. Discussed with the patient and his wife that it is very important that they follow up closely at [facility] for recheck. They were also instructed again to take his medications as directed.  Merryl Hacker, MD 02/28/15 [phone]

## 2015-02-28 NOTE — Discharge Instructions (Signed)
You were seen today for high blood sugars. It is very important that you take your medications as directed. Continue to monitor your blood sugars at home 3 times daily. Follow-up with [place] tomorrow for recheck.  Hyperglycemia Hyperglycemia occurs when the glucose (sugar) in your blood is too high. Hyperglycemia can happen for many reasons, but it most often happens to people who do not know they have diabetes or are not managing their diabetes properly.  CAUSES  Whether you have diabetes or not, there are other causes of hyperglycemia. Hyperglycemia can occur when you have diabetes, but it can also occur in other situations that you might not be as aware of, such as: Diabetes  If you have diabetes and are having problems controlling your blood glucose, hyperglycemia could occur because of some of the following reasons:  Not following your meal plan.  Not taking your diabetes medications or not taking it properly.  Exercising less or doing less activity than you normally do.  Being sick. Pre-diabetes  This cannot be ignored. Before people develop Type 2 diabetes, they almost always have "pre-diabetes." This is when your blood glucose levels are higher than normal, but not yet high enough to be diagnosed as diabetes. Research has shown that some long-term damage to the body, especially the heart and circulatory system, may already be occurring during pre-diabetes. If you take action to manage your blood glucose when you have pre-diabetes, you may delay or prevent Type 2 diabetes from developing. Stress  If you have diabetes, you may be "diet" controlled or on oral medications or insulin to control your diabetes. However, you may find that your blood glucose is higher than usual in the hospital whether you have diabetes or not. This is often referred to as "stress hyperglycemia." Stress can elevate your blood glucose. This happens because of hormones put out by the body during times of stress.  If stress has been the cause of your high blood glucose, it can be followed regularly by your caregiver. That way he/she can make sure your hyperglycemia does not continue to get worse or progress to diabetes. Steroids  Steroids are medications that act on the infection fighting system (immune system) to block inflammation or infection. One side effect can be a rise in blood glucose. Most people can produce enough extra insulin to allow for this rise, but for those who cannot, steroids make blood glucose levels go even higher. It is not unusual for steroid treatments to "uncover" diabetes that is developing. It is not always possible to determine if the hyperglycemia will go away after the steroids are stopped. A special blood test called an A1c is sometimes done to determine if your blood glucose was elevated before the steroids were started. SYMPTOMS  Thirsty.  Frequent urination.  Dry mouth.  Blurred vision.  Tired or fatigue.  Weakness.  Sleepy.  Tingling in feet or leg. DIAGNOSIS  Diagnosis is made by monitoring blood glucose in one or all of the following ways:  A1c test. This is a chemical found in your blood.  Fingerstick blood glucose monitoring.  Laboratory results. TREATMENT  First, knowing the cause of the hyperglycemia is important before the hyperglycemia can be treated. Treatment may include, but is not be limited to:  Education.  Change or adjustment in medications.  Change or adjustment in meal plan.  Treatment for an illness, infection, etc.  More frequent blood glucose monitoring.  Change in exercise plan.  Decreasing or stopping steroids.  Lifestyle changes.  HOME CARE INSTRUCTIONS   Test your blood glucose as directed.  Exercise regularly. Your caregiver will give you instructions about exercise. Pre-diabetes or diabetes which comes on with stress is helped by exercising.  Eat wholesome, balanced meals. Eat often and at regular, fixed times.  Your caregiver or nutritionist will give you a meal plan to guide your sugar intake.  Being at an ideal weight is important. If needed, losing as little as 10 to 15 pounds may help improve blood glucose levels. SEEK MEDICAL CARE IF:   You have questions about medicine, activity, or diet.  You continue to have symptoms (problems such as increased thirst, urination, or weight gain). SEEK IMMEDIATE MEDICAL CARE IF:   You are vomiting or have diarrhea.  Your breath smells fruity.  You are breathing faster or slower.  You are very sleepy or incoherent.  You have numbness, tingling, or pain in your feet or hands.  You have chest pain.  Your symptoms get worse even though you have been following your caregiver's orders.  If you have any other questions or concerns.   This information is not intended to replace advice given to you by your health care provider. Make sure you discuss any questions you have with your health care provider.   Document Released: 10/30/2000 Document Revised: 07/29/2011 Document Reviewed: 01/10/2015 Elsevier Interactive Patient Education [employer].

## 2015-02-28 NOTE — ED Notes (Signed)
IV attempted x 1 second RN to attempt

## 2015-02-28 NOTE — ED Notes (Signed)
Pt. advised by his PCP to go to ER due to  elevated blood sugar this evening , denies nausea or vomitting , respirations unlabored / no fever or chills.

## 2015-02-28 NOTE — Progress Notes (Signed)
Patient ID: Russell Price, male    DOB: 1960/08/30  Age: 54 y.o. MRN: [id number]  Chief Complaint  Patient presents with  . Follow-up    Diabetes    Subjective:   54 year old man who has diabetes, was here yesterday. He sugar was high when he was here I gave him 20 of short acting insulin. He did not take his insulin last night despite instructions to do so. At about 11:30 I got a phone call that his blood sugar was over 800. Dr. Everlene Farrier and tried to call them but couldn't get through. I was able to find a number and reach his wife. The patient went to the emergency room, and the sugar tong on down to over 300.  Current allergies, medications, problem list, past/family and social histories reviewed.  Objective:  BP 140/62 mmHg  Pulse 52  Temp(Src) 98 F (36.7 C) (Oral)  Resp 18  Ht 5\' 10"  (1.778 m)  Wt 173 lb 3.2 oz (78.563 kg)  BMI 24.85 kg/m2  SpO2 99%  His BNP was also quite high. No evidence of JVD today. Chest clear. Heart regular without murmurs. No ankle edema. He looks good and is feeling good today.  Assessment & Plan:   Assessment: 1. CKD (chronic kidney disease) stage 4, GFR 15-29 ml/min (HCC)   2. DM (diabetes mellitus) with complications ([place])   3. Essential hypertension   4. Congestive heart failure, unspecified congestive heart failure chronicity, unspecified congestive heart failure type ([place])       Plan: Spent time explaining everything to him. I did not check his sugar again right now, because he has not been taking his medicines faithfully. Rather I emphasized the importance need of taking everything, explained the kidney disease and heart disease. He just had a stroke also. We will have him take his medicines per my instructions. Printed off some diabetic recordkeeping sheets for them to monitor what he is doing and bring them back when they come in in one week. They can contact me sooner if problems.  Once I have gotten a initial plan in place and getting  them to be a little bit more compliant, we will make referral to nephrology for the chronic kidney disease though there is nothing acutely to be done and he probably will need to be underdevelopment care of her endocrinologist if we are not getting sugars coming down to at least near reasonable.  Patient Instructions  Important to take diabetes medicine every day:  Glipizide 5 mg 2 pills twice daily  Take Lantus 60 units every evening   Check your blood sugar before breakfast and 2 hours after the evening meal.  Write these down and keep a record of them.  If your sugar before breakfast is over 250 take 20 units of the Novolog insulin.  Return next week for recheck.  Call if the sugars are running too high to measure.       Return in about 1 week (around 03/07/2015).   HOPPER,DAVID, MD 02/28/2015

## 2015-02-28 NOTE — Telephone Encounter (Signed)
I received a call from [facility] at 11:00 the patient had a glucose of 854 with the BUNs of 45 and creatinine 3.03. I called his treating physician Dr. Linna Darner who will attempt to contact patient regarding this.

## 2015-02-28 NOTE — Patient Instructions (Signed)
Important to take diabetes medicine every day:  Glipizide 5 mg 2 pills twice daily  Take Lantus 60 units every evening   Check your blood sugar before breakfast and 2 hours after the evening meal.  Write these down and keep a record of them.  If your sugar before breakfast is over 250 take 20 units of the Novolog insulin.  Return next week for recheck.  Call if the sugars are running too high to measure.

## 2015-03-07 ENCOUNTER — Ambulatory Visit (INDEPENDENT_AMBULATORY_CARE_PROVIDER_SITE_OTHER): Payer: BLUE CROSS/BLUE SHIELD | Admitting: Family Medicine

## 2015-03-07 VITALS — BP 112/74 | HR 48 | Temp 98.1°F | Resp 18 | Ht 70.0 in | Wt 176.0 lb

## 2015-03-07 DIAGNOSIS — I10 Essential (primary) hypertension: Secondary | ICD-10-CM

## 2015-03-07 DIAGNOSIS — E118 Type 2 diabetes mellitus with unspecified complications: Secondary | ICD-10-CM | POA: Diagnosis not present

## 2015-03-07 DIAGNOSIS — H5441 Blindness, right eye, normal vision left eye: Secondary | ICD-10-CM | POA: Diagnosis not present

## 2015-03-07 DIAGNOSIS — I6350 Cerebral infarction due to unspecified occlusion or stenosis of unspecified cerebral artery: Secondary | ICD-10-CM

## 2015-03-07 DIAGNOSIS — I252 Old myocardial infarction: Secondary | ICD-10-CM | POA: Diagnosis not present

## 2015-03-07 DIAGNOSIS — N184 Chronic kidney disease, stage 4 (severe): Secondary | ICD-10-CM

## 2015-03-07 DIAGNOSIS — H5440 Blindness, one eye, unspecified eye: Secondary | ICD-10-CM

## 2015-03-07 DIAGNOSIS — E785 Hyperlipidemia, unspecified: Secondary | ICD-10-CM | POA: Diagnosis not present

## 2015-03-07 LAB — COMPLETE METABOLIC PANEL WITH GFR
ALBUMIN: 3.6 g/dL (ref 3.6–5.1)
ALK PHOS: 84 U/L (ref 40–115)
ALT: 26 U/L (ref 9–46)
AST: 20 U/L (ref 10–35)
BILIRUBIN TOTAL: 0.4 mg/dL (ref 0.2–1.2)
BUN: 52 mg/dL — ABNORMAL HIGH (ref 7–25)
CO2: 21 mmol/L (ref 20–31)
CREATININE: 2.89 mg/dL — AB (ref 0.70–1.33)
Calcium: 8.5 mg/dL — ABNORMAL LOW (ref 8.6–10.3)
Chloride: 108 mmol/L (ref 98–110)
GFR, Est African American: 27 mL/min — ABNORMAL LOW (ref >=60)
GFR, Est Non African American: 23 mL/min — ABNORMAL LOW (ref >=60)
GLUCOSE: 226 mg/dL — AB (ref 65–99)
Potassium: 5.9 mmol/L — ABNORMAL HIGH (ref 3.5–5.3)
SODIUM: 138 mmol/L (ref 135–146)
TOTAL PROTEIN: 6.6 g/dL (ref 6.1–8.1)

## 2015-03-07 MED ORDER — ATORVASTATIN CALCIUM 40 MG PO TABS: 40.0000 mg | ORAL_TABLET | Freq: Every day | ORAL | Status: DC

## 2015-03-07 MED ORDER — CARVEDILOL 6.25 MG PO TABS: 6.2500 mg | ORAL_TABLET | Freq: Two times a day (BID) | ORAL | Status: DC

## 2015-03-07 MED ORDER — INSULIN GLARGINE 100 UNIT/ML SOLOSTAR PEN: PEN_INJECTOR | SUBCUTANEOUS | Status: DC

## 2015-03-07 NOTE — Progress Notes (Signed)
Patient ID: Russell Price, male    DOB: 1961-01-04  Age: 54 y.o. MRN: [id number]  Chief Complaint  Patient presents with  . Follow-up    recheck with Dr. Linna Darner.     Subjective:   Patient is here for a follow-up of his diabetes and general health. He is doing better. His strength has returned. His sugars are generally much better, though occasionally still go to the [facility] level. This morning the glucose was 159. He is eating and drinking adequately. His left-sided weakness is much improved, though he feels a tiny bit weak in the left leg still. No other cardiovascular complaints.  Current allergies, medications, problem list, past/family and social histories reviewed.  Objective:  BP 112/74 mmHg  Pulse 48  Temp(Src) 98.1 F (36.7 C) (Oral)  Resp 18  Ht 5\' 10"  (1.778 m)  Wt 176 lb (79.833 kg)  BMI 25.25 kg/m2  SpO2 99%  No acute distress. Chest clear. Heart regular without murmurs. No ankle edema. Motor strength symmetrical in upper extremities. Possibly minimal decrease of the strength in his left leg compared to the right.  Assessment & Plan:   Assessment: 1. Essential hypertension   2. DM (diabetes mellitus) with complications ([place])   3. CKD (chronic kidney disease) stage 4, GFR 15-29 ml/min (HCC)   4. Blind right eye   5. Hyperlipidemia   6. Cerebrovascular accident (CVA) due to occlusion of cerebral artery ([place])   7. History of myocardial infarct at age less than 31 years       Plan:  Will make referral to nephrology. The neurology office has made contact with him, and he is to see them back. When I see him back in 3 weeks I will do a repeat EKG. I did increase his carvedilol to 3 times daily to try lowering the blood pressure tiny bit more. Check a CMP today. Decided not to let him return to work, see him back in 3 weeks.  Orders Placed This Encounter  Procedures  . COMPLETE METABOLIC PANEL WITH GFR  . Ambulatory referral to Nephrology    Referral Priority:   Routine    Referral Type:  Consultation    Referral Reason:  Specialty Services Required    Requested Specialty:  Nephrology    Number of Visits Requested:  1    Meds ordered this encounter  Medications  . atorvastatin (LIPITOR) 40 MG tablet    Sig: Take 1 tablet (40 mg total) by mouth daily at 6 PM.    Dispense:  30 tablet    Refill:  5  . carvedilol (COREG) 6.25 MG tablet    Sig: Take 1 tablet (6.25 mg total) by mouth 2 (two) times daily with a meal.    Dispense:  90 tablet    Refill:  5  . Insulin Glargine (LANTUS SOLOSTAR) 100 UNIT/ML Solostar Pen    Sig: Inject 50 units subcutaneously at bedtime.    Dispense:  15 mL    Refill:  12         Patient Instructions  Referral is being made back to nephrology.  Call Dr.Sethi back and make a follow-up appointment for your stroke  Continue current medications  Return to see me in 3 weeks. I changed my mind and would like you to stay off of work until then.  Continue to monitor and record her sugars     No Follow-up on file.   Jaymee Tilson, MD 03/07/2015

## 2015-03-07 NOTE — Patient Instructions (Signed)
Referral is being made back to nephrology.  Call Dr.Sethi back and make a follow-up appointment for your stroke  Continue current medications  Return to see me in 3 weeks. I changed my mind and would like you to stay off of work until then.  Continue to monitor and record her sugars

## 2015-03-15 LAB — FUNGUS CULTURE W SMEAR
FUNGAL SMEAR: NONE SEEN
FUNGAL SMEAR: NONE SEEN

## 2015-03-17 LAB — FUNGUS CULTURE W SMEAR: FUNGAL SMEAR: NONE SEEN

## 2015-03-27 ENCOUNTER — Ambulatory Visit (INDEPENDENT_AMBULATORY_CARE_PROVIDER_SITE_OTHER): Payer: BLUE CROSS/BLUE SHIELD | Admitting: Family Medicine

## 2015-03-27 VITALS — BP 132/72 | HR 51 | Temp 97.7°F | Resp 16 | Ht 70.0 in | Wt 176.0 lb

## 2015-03-27 DIAGNOSIS — N183 Type 2 diabetes mellitus with diabetic chronic kidney disease: Secondary | ICD-10-CM

## 2015-03-27 DIAGNOSIS — Z794 Long term (current) use of insulin: Secondary | ICD-10-CM | POA: Diagnosis not present

## 2015-03-27 DIAGNOSIS — Z23 Encounter for immunization: Secondary | ICD-10-CM

## 2015-03-27 DIAGNOSIS — E1122 Type 2 diabetes mellitus with diabetic chronic kidney disease: Secondary | ICD-10-CM | POA: Diagnosis not present

## 2015-03-27 NOTE — Patient Instructions (Addendum)
Same meds  Exercise  Return in January, sooner if problems  Influenza Virus Vaccine injection (Fluarix) What is this medicine? INFLUENZA VIRUS VACCINE (in floo EN zuh VAHY ruhs vak SEEN) helps to reduce the risk of getting influenza also known as the flu. This medicine may be used for other purposes; ask your health care provider or pharmacist if you have questions. What should I tell my health care provider before I take this medicine? They need to know if you have any of these conditions: -bleeding disorder like hemophilia -fever or infection -Guillain-Barre syndrome or other neurological problems -immune system problems -infection with the human immunodeficiency virus (HIV) or AIDS -low blood platelet counts -multiple sclerosis -an unusual or allergic reaction to influenza virus vaccine, eggs, chicken proteins, latex, gentamicin, other medicines, foods, dyes or preservatives -pregnant or trying to get pregnant -breast-feeding How should I use this medicine? This vaccine is for injection into a muscle. It is given by a health care professional. A copy of Vaccine Information Statements will be given before each vaccination. Read this sheet carefully each time. The sheet may change frequently. Talk to your pediatrician regarding the use of this medicine in children. Special care may be needed. Overdosage: If you think you have taken too much of this medicine contact a poison control center or emergency room at once. NOTE: This medicine is only for you. Do not share this medicine with others. What if I miss a dose? This does not apply. What may interact with this medicine? -chemotherapy or radiation therapy -medicines that lower your immune system like etanercept, anakinra, infliximab, and adalimumab -medicines that treat or prevent blood clots like warfarin -phenytoin -steroid medicines like prednisone or cortisone -theophylline -vaccines This list may not describe all possible  interactions. Give your health care provider a list of all the medicines, herbs, non-prescription drugs, or dietary supplements you use. Also tell them if you smoke, drink alcohol, or use illegal drugs. Some items may interact with your medicine. What should I watch for while using this medicine? Report any side effects that do not go away within 3 days to your doctor or health care professional. Call your health care provider if any unusual symptoms occur within 6 weeks of receiving this vaccine. You may still catch the flu, but the illness is not usually as bad. You cannot get the flu from the vaccine. The vaccine will not protect against colds or other illnesses that may cause fever. The vaccine is needed every year. What side effects may I notice from receiving this medicine? Side effects that you should report to your doctor or health care professional as soon as possible: -allergic reactions like skin rash, itching or hives, swelling of the face, lips, or tongue Side effects that usually do not require medical attention (report to your doctor or health care professional if they continue or are bothersome): -fever -headache -muscle aches and pains -pain, tenderness, redness, or swelling at site where injected -weak or tired This list may not describe all possible side effects. Call your doctor for medical advice about side effects. You may report side effects to FDA at 1-800-FDA-1088. Where should I keep my medicine? This vaccine is only given in a clinic, pharmacy, doctor's office, or other health care setting and will not be stored at home. NOTE: This sheet is a summary. It may not cover all possible information. If you have questions about this medicine, talk to your doctor, pharmacist, or health care provider.    2016,  Elsevier/Gold Standard. (2007-12-02 09:30:40)

## 2015-03-27 NOTE — Progress Notes (Signed)
Patient ID: Russell Price, male    DOB: Nov 07, 1960  Age: 54 y.o. MRN: [id number]  Chief Complaint  Patient presents with  . Follow-up    says Dr. Linna Darner told him to follow up today    Subjective:   Patient is here for a follow-up of his diabetes. He feels pretty well. His sugars are usually good though occasionally in the evening they shoot up. He showed me the list of his sugars.  Current allergies, medications, problem list, past/family and social histories reviewed.  Objective:  BP 132/72 mmHg  Pulse 51  Temp(Src) 97.7 F (36.5 C) (Oral)  Resp 16  Ht 5\' 10"  (1.778 m)  Wt 176 lb (79.833 kg)  BMI 25.25 kg/m2  SpO2 99%  Did not do a physical exam on him again today.  Assessment & Plan:   Assessment: 1. Type 2 diabetes mellitus with stage 3 chronic kidney disease, with long-term current use of insulin ([place])   2. Needs flu shot       Plan: Continue to follow up in a couple of months. He is to stick with his diet and exercise and regular medication use. Will repeat labs next time.  Orders Placed This Encounter  Procedures  . Flu Vaccine QUAD 36+ mos IM  . BASIC METABOLIC PANEL WITH GFR     Patient Instructions  Same meds  Exercise  Return in January, sooner if problems  Influenza Virus Vaccine injection (Fluarix) What is this medicine? INFLUENZA VIRUS VACCINE (in floo EN zuh VAHY ruhs vak SEEN) helps to reduce the risk of getting influenza also known as the flu. This medicine may be used for other purposes; ask your health care provider or pharmacist if you have questions. What should I tell my health care provider before I take this medicine? They need to know if you have any of these conditions: -bleeding disorder like hemophilia -fever or infection -Guillain-Barre syndrome or other neurological problems -immune system problems -infection with the human immunodeficiency virus (HIV) or AIDS -low blood platelet counts -multiple sclerosis -an unusual or  allergic reaction to influenza virus vaccine, eggs, chicken proteins, latex, gentamicin, other medicines, foods, dyes or preservatives -pregnant or trying to get pregnant -breast-feeding How should I use this medicine? This vaccine is for injection into a muscle. It is given by a health care professional. A copy of Vaccine Information Statements will be given before each vaccination. Read this sheet carefully each time. The sheet may change frequently. Talk to your pediatrician regarding the use of this medicine in children. Special care may be needed. Overdosage: If you think you have taken too much of this medicine contact a poison control center or emergency room at once. NOTE: This medicine is only for you. Do not share this medicine with others. What if I miss a dose? This does not apply. What may interact with this medicine? -chemotherapy or radiation therapy -medicines that lower your immune system like etanercept, anakinra, infliximab, and adalimumab -medicines that treat or prevent blood clots like warfarin -phenytoin -steroid medicines like prednisone or cortisone -theophylline -vaccines This list may not describe all possible interactions. Give your health care provider a list of all the medicines, herbs, non-prescription drugs, or dietary supplements you use. Also tell them if you smoke, drink alcohol, or use illegal drugs. Some items may interact with your medicine. What should I watch for while using this medicine? Report any side effects that do not go away within 3 days to your doctor or health  care professional. Call your health care provider if any unusual symptoms occur within 6 weeks of receiving this vaccine. You may still catch the flu, but the illness is not usually as bad. You cannot get the flu from the vaccine. The vaccine will not protect against colds or other illnesses that may cause fever. The vaccine is needed every year. What side effects may I notice from  receiving this medicine? Side effects that you should report to your doctor or health care professional as soon as possible: -allergic reactions like skin rash, itching or hives, swelling of the face, lips, or tongue Side effects that usually do not require medical attention (report to your doctor or health care professional if they continue or are bothersome): -fever -headache -muscle aches and pains -pain, tenderness, redness, or swelling at site where injected -weak or tired This list may not describe all possible side effects. Call your doctor for medical advice about side effects. You may report side effects to FDA at 1-800-FDA-1088. Where should I keep my medicine? This vaccine is only given in a clinic, pharmacy, doctor's office, or other health care setting and will not be stored at home. NOTE: This sheet is a summary. It may not cover all possible information. If you have questions about this medicine, talk to your doctor, pharmacist, or health care provider.    2016, Elsevier/Gold Standard. (2007-12-02 09:30:40)     No Follow-up on file.   Micaiah Remillard, MD 03/27/2015

## 2015-03-28 LAB — BASIC METABOLIC PANEL WITH GFR
BUN: 42 mg/dL — AB (ref 7–25)
CHLORIDE: 107 mmol/L (ref 98–110)
CO2: 22 mmol/L (ref 20–31)
CREATININE: 2.55 mg/dL — AB (ref 0.70–1.33)
Calcium: 8.5 mg/dL — ABNORMAL LOW (ref 8.6–10.3)
GFR, Est African American: 31 mL/min — ABNORMAL LOW (ref >=60)
GFR, Est Non African American: 27 mL/min — ABNORMAL LOW (ref >=60)
GLUCOSE: 228 mg/dL — AB (ref 65–99)
POTASSIUM: 5 mmol/L (ref 3.5–5.3)
Sodium: 138 mmol/L (ref 135–146)

## 2015-03-29 LAB — AFB CULTURE WITH SMEAR (NOT AT ARMC)
ACID FAST SMEAR: NONE SEEN
Acid Fast Smear: NONE SEEN

## 2015-03-30 LAB — AFB CULTURE WITH SMEAR (NOT AT ARMC): Acid Fast Smear: NONE SEEN

## 2015-03-31 ENCOUNTER — Telehealth: Payer: Self-pay | Admitting: Family Medicine

## 2015-03-31 NOTE — Telephone Encounter (Signed)
I changed my mind and did have him stay off the last 3 weeks.  It is okay for him to return to work now.  Give him a letter if he needs it.

## 2015-03-31 NOTE — Telephone Encounter (Signed)
He was seen 03/07/15 and you advised him to follow up in 3 weeks.  He followed up 03/27/2015.  Can you write a note that he is ok to return to work.  You gave him a letter on 03/07/15 (see those).

## 2015-03-31 NOTE — Telephone Encounter (Signed)
Patient wants to know when he can RTW.   [phone]

## 2015-04-03 NOTE — Telephone Encounter (Signed)
Spoke with pt, he picked up letter yesterday.

## 2015-07-20 ENCOUNTER — Ambulatory Visit (INDEPENDENT_AMBULATORY_CARE_PROVIDER_SITE_OTHER): Payer: Self-pay | Admitting: Family Medicine

## 2015-07-20 VITALS — BP 128/73 | HR 60 | Temp 98.3°F | Resp 16 | Ht 70.0 in | Wt 169.4 lb

## 2015-07-20 DIAGNOSIS — H5440 Blindness, one eye, unspecified eye: Secondary | ICD-10-CM

## 2015-07-20 DIAGNOSIS — I10 Essential (primary) hypertension: Secondary | ICD-10-CM

## 2015-07-20 DIAGNOSIS — E785 Hyperlipidemia, unspecified: Secondary | ICD-10-CM

## 2015-07-20 DIAGNOSIS — H5441 Blindness, right eye, normal vision left eye: Secondary | ICD-10-CM

## 2015-07-20 DIAGNOSIS — E118 Type 2 diabetes mellitus with unspecified complications: Secondary | ICD-10-CM

## 2015-07-20 DIAGNOSIS — N184 Chronic kidney disease, stage 4 (severe): Secondary | ICD-10-CM

## 2015-07-20 LAB — GLUCOSE, POCT (MANUAL RESULT ENTRY): POC Glucose: >444 mg/dl (ref 70–99)

## 2015-07-20 LAB — POCT GLYCOSYLATED HEMOGLOBIN (HGB A1C): HEMOGLOBIN A1C: 10.9

## 2015-07-20 MED ORDER — GLIPIZIDE 5 MG PO TABS: ORAL_TABLET | ORAL | Status: DC

## 2015-07-20 MED ORDER — ATORVASTATIN CALCIUM 40 MG PO TABS: 40.0000 mg | ORAL_TABLET | Freq: Every day | ORAL | Status: DC

## 2015-07-20 MED ORDER — CARVEDILOL 6.25 MG PO TABS: 6.2500 mg | ORAL_TABLET | Freq: Two times a day (BID) | ORAL | Status: DC

## 2015-07-20 MED ORDER — AMLODIPINE BESYLATE 10 MG PO TABS: 10.0000 mg | ORAL_TABLET | Freq: Every day | ORAL | Status: DC

## 2015-07-20 MED ORDER — INSULIN GLARGINE 100 UNIT/ML SOLOSTAR PEN: PEN_INJECTOR | SUBCUTANEOUS | Status: DC

## 2015-07-20 NOTE — Patient Instructions (Addendum)
Continue current medications  Use the coupon to try and get the Lantus. If your sugar continues to run high increase the Lantus to 60 units in 1 week. Then increase to 70 in one more week.  Plan to return in 3-4 months for a recheck.  Come in sooner if glucose is not getting below 200  Because you received labwork today, you will receive an invoice from [employer]. Please contact [facility] at [phone] with questions or concerns regarding your invoice. Our billing staff will not be able to assist you with those questions.  You will be contacted with the lab results as soon as they are available. The fastest way to get your results is to activate your My Chart account. Instructions are located on the last page of this paperwork. If you have not heard from [place] regarding the results in 2 weeks, please contact this office.

## 2015-07-20 NOTE — Progress Notes (Addendum)
Patient ID: Russell Price, male    DOB: 18-Aug-1960  Age: 55 y.o. MRN: [id number]  Chief Complaint  Patient presents with  . Follow-up    diabetes    Subjective:   55 year old man with a history of diabetes. He lost his job in November, so he has not been able to afford test strips and insulin. Insulin just ran out on him. It was going to cost over $300. We were able to locate a coupon for him today. He feels fairly well. Review of systems including cardiovascular, respiratory, GI, GU, muscular skeletal are all satisfactory.  Current allergies, medications, problem list, past/family and social histories reviewed.  Objective:  BP 128/73 mmHg  Pulse 60  Temp(Src) 98.3 F (36.8 C) (Oral)  Resp 16  Ht 5\' 10"  (1.778 m)  Wt 169 lb 6.4 oz (76.839 kg)  BMI 24.31 kg/m2  SpO2 99%  No major acute distress. His chest clear. Heart regular without murmurs. Assessment: 1. DM (diabetes mellitus) with complications ([place])   2. Essential hypertension   3. CKD (chronic kidney disease) stage 4, GFR 15-29 ml/min (HCC)   4. Blind right eye   5. Hyperlipidemia       Plan: check labs and continue current meds. Orders Placed This Encounter  Procedures  . COMPLETE METABOLIC PANEL WITH GFR  . POCT glucose (manual entry)  . POCT glycosylated hemoglobin (Hb A1C)  . PR BLOOD GLUCOSE TEST STRIPS    Meds ordered this encounter  Medications  . glipiZIDE (GLUCOTROL) 5 MG tablet    Sig: Take 2 pills in the morning and one in the evening for diabetes    Dispense:  90 tablet    Refill:  11  . Insulin Glargine (LANTUS SOLOSTAR) 100 UNIT/ML Solostar Pen    Sig: Inject 60 units subcutaneously at bedtime.    Dispense:  15 mL    Refill:  12  . amLODipine (NORVASC) 10 MG tablet    Sig: Take 1 tablet (10 mg total) by mouth daily. Stop lisinopril .    Dispense:  30 tablet    Refill:  11  . carvedilol (COREG) 6.25 MG tablet    Sig: Take 1 tablet (6.25 mg total) by mouth 2 (two) times daily with a meal.     Dispense:  90 tablet    Refill:  11  . atorvastatin (LIPITOR) 40 MG tablet    Sig: Take 1 tablet (40 mg total) by mouth daily at 6 PM.    Dispense:  30 tablet    Refill:  11     Results for orders placed or performed in visit on 07/20/15  POCT glucose (manual entry)  Result Value Ref Range   POC Glucose >444 70 - 99 mg/dl  POCT glycosylated hemoglobin (Hb A1C)  Result Value Ref Range   Hemoglobin A1C 10.9     Results for orders placed or performed in visit on 07/20/15  COMPLETE METABOLIC PANEL WITH GFR  Result Value Ref Range   Sodium 121 (L) 135 - 146 mmol/L   Potassium 5.5 (H) 3.5 - 5.3 mmol/L   Chloride 89 (L) 98 - 110 mmol/L   CO2 23 20 - 31 mmol/L   Glucose, Bld 996 (HH) 65 - 99 mg/dL   BUN 50 (H) 7 - 25 mg/dL   Creat 3.62 (H) 0.70 - 1.33 mg/dL   Total Bilirubin 0.5 0.2 - 1.2 mg/dL   Alkaline Phosphatase 123 (H) 40 - 115 U/L   AST 8 (L) 10 -  35 U/L   ALT 9 9 - 46 U/L   Total Protein 7.1 6.1 - 8.1 g/dL   Albumin 3.7 3.6 - 5.1 g/dL   Calcium 8.4 (L) 8.6 - 10.3 mg/dL   GFR, Est African American 20 (L) >=60 mL/min   GFR, Est Non African American 18 (L) >=60 mL/min  POCT glucose (manual entry)  Result Value Ref Range   POC Glucose >444 70 - 99 mg/dl  POCT glycosylated hemoglobin (Hb A1C)  Result Value Ref Range   Hemoglobin A1C 10.9       Patient Instructions  Continue current medications  Use the coupon to try and get the Lantus. If your sugar continues to run high increase the Lantus to 60 units in 1 week. Then increase to 70 in one more week.  Plan to return in 3-4 months for a recheck.  Come in sooner if glucose is not getting below 200  Because you received labwork today, you will receive an invoice from [employer]. Please contact [facility] at [phone] with questions or concerns regarding your invoice. Our billing staff will not be able to assist you with those questions.  You will be contacted with the lab results as soon  as they are available. The fastest way to get your results is to activate your My Chart account. Instructions are located on the last page of this paperwork. If you have not heard from [place] regarding the results in 2 weeks, please contact this office.       No Follow-up on file.   Mishawn Didion, MD 07/20/2015

## 2015-07-21 ENCOUNTER — Telehealth: Payer: Self-pay | Admitting: Internal Medicine

## 2015-07-21 ENCOUNTER — Encounter: Payer: Self-pay | Admitting: Internal Medicine

## 2015-07-21 LAB — COMPLETE METABOLIC PANEL WITH GFR
ALBUMIN: 3.7 g/dL (ref 3.6–5.1)
ALK PHOS: 123 U/L — AB (ref 40–115)
ALT: 9 U/L (ref 9–46)
AST: 8 U/L — AB (ref 10–35)
BILIRUBIN TOTAL: 0.5 mg/dL (ref 0.2–1.2)
BUN: 50 mg/dL — AB (ref 7–25)
CO2: 23 mmol/L (ref 20–31)
Calcium: 8.4 mg/dL — ABNORMAL LOW (ref 8.6–10.3)
Chloride: 89 mmol/L — ABNORMAL LOW (ref 98–110)
Creat: 3.62 mg/dL — ABNORMAL HIGH (ref 0.70–1.33)
GFR, Est African American: 20 mL/min — ABNORMAL LOW (ref >=60)
GFR, Est Non African American: 18 mL/min — ABNORMAL LOW (ref >=60)
GLUCOSE: 996 mg/dL — AB (ref 65–99)
POTASSIUM: 5.5 mmol/L — AB (ref 3.5–5.3)
SODIUM: 121 mmol/L — AB (ref 135–146)
TOTAL PROTEIN: 7.1 g/dL (ref 6.1–8.1)

## 2015-07-21 NOTE — Telephone Encounter (Signed)
Results for orders placed or performed in visit on 07/20/15  COMPLETE METABOLIC PANEL WITH GFR  Result Value Ref Range   Sodium 121 (L) 135 - 146 mmol/L   Potassium 5.5 (H) 3.5 - 5.3 mmol/L   Chloride 89 (L) 98 - 110 mmol/L   CO2 23 20 - 31 mmol/L   Glucose, Bld 996 (HH) 65 - 99 mg/dL   BUN 50 (H) 7 - 25 mg/dL   Creat 3.62 (H) 0.70 - 1.33 mg/dL   Total Bilirubin 0.5 0.2 - 1.2 mg/dL   Alkaline Phosphatase 123 (H) 40 - 115 U/L   AST 8 (L) 10 - 35 U/L   ALT 9 9 - 46 U/L   Total Protein 7.1 6.1 - 8.1 g/dL   Albumin 3.7 3.6 - 5.1 g/dL   Calcium 8.4 (L) 8.6 - 10.3 mg/dL   GFR, Est African American 20 (L) >=60 mL/min   GFR, Est Non African American 18 (L) >=60 mL/min  POCT glucose (manual entry)  Result Value Ref Range   POC Glucose >444 70 - 99 mg/dl  POCT glycosylated hemoglobin (Hb A1C)  Result Value Ref Range   Hemoglobin A1C 10.9    Called by lab--1:15 am-- labs from 07/20/15 Since stable by exam, can repeat Labs Friday and follow reintroduction of insulin, vs admit for fluids

## 2015-07-21 NOTE — Telephone Encounter (Signed)
Left message for pt to call back  °

## 2015-07-21 NOTE — Telephone Encounter (Signed)
I have tried to reach the patient unsuccessfully.  Please try to call him.  Tell him it is absolutely important for him to start the insulin back today, that his sugar was very high.  I would like to recheck him on Monday, sooner if problems.  Look to see if we have any lantus we can give him.

## 2015-07-21 NOTE — Progress Notes (Signed)
Patients labs from last night showed very high sugar.  I have left him a message to call and have texted him.  Ruben Reason MD

## 2015-07-24 ENCOUNTER — Ambulatory Visit (INDEPENDENT_AMBULATORY_CARE_PROVIDER_SITE_OTHER): Payer: Self-pay | Admitting: Family Medicine

## 2015-07-24 VITALS — BP 129/84 | HR 61 | Temp 98.3°F | Resp 20 | Ht 70.0 in | Wt 167.2 lb

## 2015-07-24 DIAGNOSIS — E871 Hypo-osmolality and hyponatremia: Secondary | ICD-10-CM

## 2015-07-24 DIAGNOSIS — E1122 Type 2 diabetes mellitus with diabetic chronic kidney disease: Secondary | ICD-10-CM

## 2015-07-24 DIAGNOSIS — E875 Hyperkalemia: Secondary | ICD-10-CM

## 2015-07-24 DIAGNOSIS — N183 Chronic kidney disease, stage 3 unspecified: Secondary | ICD-10-CM

## 2015-07-24 DIAGNOSIS — Z794 Long term (current) use of insulin: Secondary | ICD-10-CM

## 2015-07-24 LAB — BASIC METABOLIC PANEL
BUN: 50 mg/dL — AB (ref 7–25)
CHLORIDE: 98 mmol/L (ref 98–110)
CO2: 24 mmol/L (ref 20–31)
CREATININE: 3.7 mg/dL — AB (ref 0.70–1.33)
Calcium: 8.6 mg/dL (ref 8.6–10.3)
Glucose, Bld: 520 mg/dL (ref 65–99)
Potassium: 5.1 mmol/L (ref 3.5–5.3)
Sodium: 132 mmol/L — ABNORMAL LOW (ref 135–146)

## 2015-07-24 LAB — GLUCOSE, POCT (MANUAL RESULT ENTRY): POC Glucose: >444 mg/dl (ref 70–99)

## 2015-07-24 NOTE — Telephone Encounter (Signed)
CAlled pt again, no answer. Left VM. Sent unable to reach letter.

## 2015-07-24 NOTE — Patient Instructions (Signed)
Take the NovoLog 20 units when you get home.  Increase your Lantus to 50 units in the evening and 30 units in the morning  Get your sugar meter working.  If the glucose is not less than 400 by tomorrow afternoon please call me back. Give the medical assistant that takes the phone call instructions then I said I wanted them to be sure that I was informed of that.  Let me know in the next week of your sugars are going  If you're feeling worse at anytime go to the emergency room or return here  Take caution for your sugar going too low as we have discussed

## 2015-07-24 NOTE — Progress Notes (Addendum)
Patient ID: Russell Price, male    DOB: 1960/08/13  Age: 55 y.o. MRN: [id number]  Chief Complaint  Patient presents with  . Follow-up    diabetes     Subjective:   Patient was here last weekend had been out of his insulin. His blood sugar here in the office was above the limits of our meter, at greater than 444. Later that night we got the report that his blood sugar was 996, but I was unable to reach him. However he did finally get the message. We've given him a card to try and get his insulin at a better price, which she was able to do so. He has started taking it again. He is been on it for 2 days now. No acute complaints today.  Current allergies, medications, problem list, past/family and social histories reviewed.  Objective:  BP 129/84 mmHg  Pulse 61  Temp(Src) 98.3 F (36.8 C) (Oral)  Resp 20  Ht 5\' 10"  (1.778 m)  Wt 167 lb 3.2 oz (75.841 kg)  BMI 23.99 kg/m2  SpO2 98%  Results for orders placed or performed in visit on 07/24/15  POCT glucose (manual entry)  Result Value Ref Range   POC Glucose >444 70 - 99 mg/dl     Assessment & Plan:   Assessment: 1. Type 2 diabetes mellitus with stage 3 chronic kidney disease, with long-term current use of insulin ([place])   2. Hyperkalemia   3. Hyponatremia       Plan: He has been running sugars high for a long stretch while out of his insulin. I do not feel hospitalization is needed, and believe that we will see this coming down as he gets some insulin into his system.  Orders Placed This Encounter  Procedures  . Basic metabolic panel  . POCT glucose (manual entry)    No orders of the defined types were placed in this encounter.    Glucose was down to 520 this time and electrolytes are much better. I received the stent reports at 8:30 PM. Continue treatment as planned.     Patient Instructions  Take the NovoLog 20 units when you get home.  Increase your Lantus to 50 units in the evening and 30 units in the  morning  Get your sugar meter working.  If the glucose is not less than 400 by tomorrow afternoon please call me back. Give the medical assistant that takes the phone call instructions then I said I wanted them to be sure that I was informed of that.  Let me know in the next week of your sugars are going  If you're feeling worse at anytime go to the emergency room or return here  Take caution for your sugar going too low as we have discussed     Return in about 10 days (around 08/03/2015).   Eldean Nanna, MD 07/24/2015

## 2015-07-25 ENCOUNTER — Encounter: Payer: Self-pay | Admitting: Family Medicine

## 2015-09-02 ENCOUNTER — Inpatient Hospital Stay (HOSPITAL_COMMUNITY): Payer: Medicaid Other

## 2015-09-02 ENCOUNTER — Emergency Department (HOSPITAL_COMMUNITY): Payer: Medicaid Other

## 2015-09-02 ENCOUNTER — Encounter (HOSPITAL_COMMUNITY): Payer: Self-pay | Admitting: Emergency Medicine

## 2015-09-02 ENCOUNTER — Inpatient Hospital Stay (HOSPITAL_COMMUNITY)
Admission: EM | Admit: 2015-09-02 | Discharge: 2015-09-08 | DRG: 637 | Disposition: A | Discharge status: Rehabilitation Facility | Payer: Medicaid Other | Attending: Family Medicine | Admitting: Family Medicine

## 2015-09-02 DIAGNOSIS — R402431 Glasgow coma scale score 3-8, in the field [EMT or ambulance]: Secondary | ICD-10-CM

## 2015-09-02 DIAGNOSIS — Z833 Family history of diabetes mellitus: Secondary | ICD-10-CM | POA: Diagnosis not present

## 2015-09-02 DIAGNOSIS — D649 Anemia, unspecified: Secondary | ICD-10-CM | POA: Diagnosis present

## 2015-09-02 DIAGNOSIS — I120 Hypertensive chronic kidney disease with stage 5 chronic kidney disease or end stage renal disease: Secondary | ICD-10-CM | POA: Diagnosis present

## 2015-09-02 DIAGNOSIS — Z79899 Other long term (current) drug therapy: Secondary | ICD-10-CM

## 2015-09-02 DIAGNOSIS — Z87891 Personal history of nicotine dependence: Secondary | ICD-10-CM

## 2015-09-02 DIAGNOSIS — R404 Transient alteration of awareness: Secondary | ICD-10-CM | POA: Diagnosis present

## 2015-09-02 DIAGNOSIS — J323 Chronic sphenoidal sinusitis: Secondary | ICD-10-CM | POA: Diagnosis present

## 2015-09-02 DIAGNOSIS — D72829 Elevated white blood cell count, unspecified: Secondary | ICD-10-CM | POA: Diagnosis present

## 2015-09-02 DIAGNOSIS — N186 End stage renal disease: Secondary | ICD-10-CM | POA: Diagnosis present

## 2015-09-02 DIAGNOSIS — G9341 Metabolic encephalopathy: Secondary | ICD-10-CM | POA: Diagnosis present

## 2015-09-02 DIAGNOSIS — J9602 Acute respiratory failure with hypercapnia: Secondary | ICD-10-CM | POA: Insufficient documentation

## 2015-09-02 DIAGNOSIS — E1110 Type 2 diabetes mellitus with ketoacidosis without coma: Secondary | ICD-10-CM | POA: Diagnosis present

## 2015-09-02 DIAGNOSIS — E872 Acidosis, unspecified: Secondary | ICD-10-CM | POA: Insufficient documentation

## 2015-09-02 DIAGNOSIS — E876 Hypokalemia: Secondary | ICD-10-CM | POA: Diagnosis present

## 2015-09-02 DIAGNOSIS — Z7982 Long term (current) use of aspirin: Secondary | ICD-10-CM

## 2015-09-02 DIAGNOSIS — E870 Hyperosmolality and hypernatremia: Secondary | ICD-10-CM | POA: Diagnosis present

## 2015-09-02 DIAGNOSIS — E861 Hypovolemia: Secondary | ICD-10-CM | POA: Diagnosis present

## 2015-09-02 DIAGNOSIS — I959 Hypotension, unspecified: Secondary | ICD-10-CM | POA: Diagnosis present

## 2015-09-02 DIAGNOSIS — N189 Chronic kidney disease, unspecified: Secondary | ICD-10-CM | POA: Insufficient documentation

## 2015-09-02 DIAGNOSIS — N179 Acute kidney failure, unspecified: Secondary | ICD-10-CM | POA: Diagnosis present

## 2015-09-02 DIAGNOSIS — Z794 Long term (current) use of insulin: Secondary | ICD-10-CM

## 2015-09-02 DIAGNOSIS — H5441 Blindness, right eye, normal vision left eye: Secondary | ICD-10-CM | POA: Diagnosis present

## 2015-09-02 DIAGNOSIS — E785 Hyperlipidemia, unspecified: Secondary | ICD-10-CM | POA: Diagnosis present

## 2015-09-02 DIAGNOSIS — R29898 Other symptoms and signs involving the musculoskeletal system: Secondary | ICD-10-CM

## 2015-09-02 DIAGNOSIS — R569 Unspecified convulsions: Secondary | ICD-10-CM | POA: Diagnosis present

## 2015-09-02 DIAGNOSIS — Z978 Presence of other specified devices: Secondary | ICD-10-CM

## 2015-09-02 DIAGNOSIS — E871 Hypo-osmolality and hyponatremia: Secondary | ICD-10-CM | POA: Diagnosis not present

## 2015-09-02 DIAGNOSIS — Z4659 Encounter for fitting and adjustment of other gastrointestinal appliance and device: Secondary | ICD-10-CM

## 2015-09-02 DIAGNOSIS — E1111 Type 2 diabetes mellitus with ketoacidosis with coma: Secondary | ICD-10-CM | POA: Insufficient documentation

## 2015-09-02 DIAGNOSIS — I248 Other forms of acute ischemic heart disease: Secondary | ICD-10-CM | POA: Diagnosis present

## 2015-09-02 DIAGNOSIS — E1311 Other specified diabetes mellitus with ketoacidosis with coma: Principal | ICD-10-CM | POA: Diagnosis present

## 2015-09-02 DIAGNOSIS — R509 Fever, unspecified: Secondary | ICD-10-CM | POA: Diagnosis not present

## 2015-09-02 DIAGNOSIS — G9340 Encephalopathy, unspecified: Secondary | ICD-10-CM | POA: Insufficient documentation

## 2015-09-02 DIAGNOSIS — R739 Hyperglycemia, unspecified: Secondary | ICD-10-CM

## 2015-09-02 DIAGNOSIS — R4189 Other symptoms and signs involving cognitive functions and awareness: Secondary | ICD-10-CM

## 2015-09-02 LAB — COMPREHENSIVE METABOLIC PANEL
ALK PHOS: 125 U/L (ref 38–126)
ALT: 12 U/L — AB (ref 17–63)
AST: 24 U/L (ref 15–41)
Albumin: 3.5 g/dL (ref 3.5–5.0)
Anion gap: 27 — ABNORMAL HIGH (ref 5–15)
BUN: 71 mg/dL — ABNORMAL HIGH (ref 6–20)
CALCIUM: 9.3 mg/dL (ref 8.9–10.3)
CO2: 8 mmol/L — AB (ref 22–32)
CREATININE: 5.29 mg/dL — AB (ref 0.61–1.24)
Chloride: 86 mmol/L — ABNORMAL LOW (ref 101–111)
GFR calc non Af Amer: 11 mL/min — ABNORMAL LOW (ref >60)
GFR, EST AFRICAN AMERICAN: 13 mL/min — AB (ref >60)
Glucose, Bld: 1142 mg/dL (ref 65–99)
Potassium: 4.3 mmol/L (ref 3.5–5.1)
SODIUM: 121 mmol/L — AB (ref 135–145)
Total Bilirubin: 0.5 mg/dL (ref 0.3–1.2)
Total Protein: 6.9 g/dL (ref 6.5–8.1)

## 2015-09-02 LAB — CBC WITH DIFFERENTIAL/PLATELET
BASOS ABS: 0 10*3/uL (ref 0.0–0.1)
BASOS ABS: 0 10*3/uL (ref 0.0–0.1)
BASOS PCT: 0 %
Basophils Relative: 0 %
EOS PCT: 0 %
EOS PCT: 0 %
Eosinophils Absolute: 0 10*3/uL (ref 0.0–0.7)
Eosinophils Absolute: 0 10*3/uL (ref 0.0–0.7)
HEMATOCRIT: 33.2 % — AB (ref 39.0–52.0)
HEMATOCRIT: 34.5 % — AB (ref 39.0–52.0)
HEMOGLOBIN: 13.1 g/dL (ref 13.0–17.0)
Hemoglobin: 12.4 g/dL — ABNORMAL LOW (ref 13.0–17.0)
LYMPHS ABS: 1 10*3/uL (ref 0.7–4.0)
LYMPHS PCT: 4 %
LYMPHS PCT: 4 %
Lymphs Abs: 0.9 10*3/uL (ref 0.7–4.0)
MCH: 28.5 pg (ref 26.0–34.0)
MCH: 28.9 pg (ref 26.0–34.0)
MCHC: 37.3 g/dL — ABNORMAL HIGH (ref 30.0–36.0)
MCHC: 38 g/dL — ABNORMAL HIGH (ref 30.0–36.0)
MCV: 76.3 fL — AB (ref 78.0–100.0)
MCV: 76.2 fL — AB (ref 78.0–100.0)
Monocytes Absolute: 1.3 10*3/uL — ABNORMAL HIGH (ref 0.1–1.0)
Monocytes Absolute: 0.9 10*3/uL (ref 0.1–1.0)
Monocytes Relative: 5 %
Monocytes Relative: 4 %
NEUTROS ABS: 22 10*3/uL — AB (ref 1.7–7.7)
NEUTROS ABS: 21.8 10*3/uL — AB (ref 1.7–7.7)
NEUTROS PCT: 91 %
Neutrophils Relative %: 92 %
PLATELETS: 254 10*3/uL (ref 150–400)
Platelets: 195 10*3/uL (ref 150–400)
RBC: 4.35 MIL/uL (ref 4.22–5.81)
RBC: 4.53 MIL/uL (ref 4.22–5.81)
WBC: 23.8 10*3/uL — AB (ref 4.0–10.5)
WBC: 23.8 10*3/uL — AB (ref 4.0–10.5)

## 2015-09-02 LAB — BLOOD GAS, ARTERIAL
ACID-BASE DEFICIT: 9.9 mmol/L — AB (ref 0.0–2.0)
Bicarbonate: 16 mEq/L — ABNORMAL LOW (ref 20.0–24.0)
DRAWN BY: 252031
FIO2: 0.4
MECHVT: 560 mL
O2 Saturation: 98.9 %
PEEP/CPAP: 5 cmH2O
PO2 ART: 150 mmHg — AB (ref 80.0–100.0)
Patient temperature: 98.6
RATE: 26 resp/min
TCO2: 17.1 mmol/L (ref 0–100)
pCO2 arterial: 38 mmHg (ref 35.0–45.0)
pH, Arterial: 7.247 — ABNORMAL LOW (ref 7.350–7.450)

## 2015-09-02 LAB — URINE MICROSCOPIC-ADD ON: WBC UA: NONE SEEN WBC/hpf (ref 0–5)

## 2015-09-02 LAB — I-STAT CHEM 8, ED
BUN: 68 mg/dL — ABNORMAL HIGH (ref 6–20)
CHLORIDE: 89 mmol/L — AB (ref 101–111)
Calcium, Ion: 1.15 mmol/L (ref 1.12–1.23)
Creatinine, Ser: 4.9 mg/dL — ABNORMAL HIGH (ref 0.61–1.24)
HCT: 40 % (ref 39.0–52.0)
Hemoglobin: 13.6 g/dL (ref 13.0–17.0)
POTASSIUM: 4.2 mmol/L (ref 3.5–5.1)
SODIUM: 123 mmol/L — AB (ref 135–145)
TCO2: 12 mmol/L (ref 0–100)

## 2015-09-02 LAB — URINALYSIS, ROUTINE W REFLEX MICROSCOPIC
Bilirubin Urine: NEGATIVE
Ketones, ur: 15 mg/dL — AB
LEUKOCYTES UA: NEGATIVE
NITRITE: NEGATIVE
PH: 5 (ref 5.0–8.0)
PROTEIN: 100 mg/dL — AB
Specific Gravity, Urine: 1.021 (ref 1.005–1.030)

## 2015-09-02 LAB — I-STAT ARTERIAL BLOOD GAS, ED
Acid-base deficit: 14 mmol/L — ABNORMAL HIGH (ref 0.0–2.0)
Bicarbonate: 15.7 mEq/L — ABNORMAL LOW (ref 20.0–24.0)
O2 Saturation: 93 %
PCO2 ART: 53 mmHg — AB (ref 35.0–45.0)
PH ART: 7.08 — AB (ref 7.350–7.450)
Patient temperature: 98.6
TCO2: 17 mmol/L (ref 0–100)
pO2, Arterial: 95 mmHg (ref 80.0–100.0)

## 2015-09-02 LAB — I-STAT TROPONIN, ED: Troponin i, poc: 0.16 ng/mL (ref 0.00–0.08)

## 2015-09-02 LAB — CBG MONITORING, ED

## 2015-09-02 LAB — AMMONIA: Ammonia: 37 umol/L — ABNORMAL HIGH (ref 9–35)

## 2015-09-02 LAB — TROPONIN I: TROPONIN I: 0.13 ng/mL — AB (ref <0.031)

## 2015-09-02 LAB — I-STAT CG4 LACTIC ACID, ED: Lactic Acid, Venous: 13.68 mmol/L (ref 0.5–2.0)

## 2015-09-02 LAB — LACTIC ACID, PLASMA: LACTIC ACID, VENOUS: 4.6 mmol/L — AB (ref 0.5–2.0)

## 2015-09-02 MED ORDER — FAMOTIDINE 40 MG/5ML PO SUSR
20.0000 mg | Freq: Every day | ORAL | Status: DC
  Administered 2015-09-03 – 2015-09-04 (×2): 20 mg
  Filled 2015-09-02 (×3): qty 2.5

## 2015-09-02 MED ORDER — PROPOFOL 1000 MG/100ML IV EMUL
0.0000 ug/kg/min | INTRAVENOUS | Status: DC
  Administered 2015-09-02: 40 ug/kg/min via INTRAVENOUS
  Administered 2015-09-02: 20 mg/h via INTRAVENOUS
  Administered 2015-09-02: 40 ug/kg/min via INTRAVENOUS
  Administered 2015-09-03: 20 ug/kg/min via INTRAVENOUS
  Administered 2015-09-03 – 2015-09-05 (×5): 30 ug/kg/min via INTRAVENOUS
  Filled 2015-09-02 (×2): qty 100
  Filled 2015-09-02: qty 200
  Filled 2015-09-02 (×4): qty 100

## 2015-09-02 MED ORDER — DEXTROSE 5 % IV SOLN
1.0000 g | Freq: Once | INTRAVENOUS | Status: AC
  Administered 2015-09-02: 1 g via INTRAVENOUS
  Filled 2015-09-02: qty 10

## 2015-09-02 MED ORDER — PROPOFOL 1000 MG/100ML IV EMUL
INTRAVENOUS | Status: AC
  Filled 2015-09-02: qty 100

## 2015-09-02 MED ORDER — CHLORHEXIDINE GLUCONATE 0.12% ORAL RINSE (MEDLINE KIT)
15.0000 mL | Freq: Two times a day (BID) | OROMUCOSAL | Status: DC
  Administered 2015-09-02 – 2015-09-06 (×9): 15 mL via OROMUCOSAL

## 2015-09-02 MED ORDER — FENTANYL CITRATE (PF) 100 MCG/2ML IJ SOLN
50.0000 ug | INTRAMUSCULAR | Status: DC | PRN
  Administered 2015-09-02 – 2015-09-08 (×7): 50 ug via INTRAVENOUS
  Filled 2015-09-02 (×7): qty 2

## 2015-09-02 MED ORDER — SODIUM CHLORIDE 0.9 % IV SOLN
INTRAVENOUS | Status: AC
  Administered 2015-09-02: 999 mL/h via INTRAVENOUS

## 2015-09-02 MED ORDER — CARVEDILOL 6.25 MG PO TABS
6.2500 mg | ORAL_TABLET | Freq: Two times a day (BID) | ORAL | Status: DC
  Administered 2015-09-03 – 2015-09-08 (×8): 6.25 mg via ORAL
  Filled 2015-09-02 (×12): qty 1

## 2015-09-02 MED ORDER — SODIUM CHLORIDE 0.9 % IV SOLN
INTRAVENOUS | Status: DC
  Administered 2015-09-02: 5.4 [IU]/h via INTRAVENOUS
  Filled 2015-09-02: qty 2.5

## 2015-09-02 MED ORDER — HEPARIN SODIUM (PORCINE) 5000 UNIT/ML IJ SOLN
5000.0000 [IU] | Freq: Three times a day (TID) | INTRAMUSCULAR | Status: DC
  Administered 2015-09-02 – 2015-09-08 (×17): 5000 [IU] via SUBCUTANEOUS
  Filled 2015-09-02 (×19): qty 1

## 2015-09-02 MED ORDER — SODIUM CHLORIDE 0.9 % IV SOLN
1000.0000 mg | Freq: Once | INTRAVENOUS | Status: AC
  Administered 2015-09-02: 1000 mg via INTRAVENOUS
  Filled 2015-09-02: qty 10

## 2015-09-02 MED ORDER — POTASSIUM CHLORIDE 10 MEQ/100ML IV SOLN
10.0000 meq | INTRAVENOUS | Status: AC
  Filled 2015-09-02: qty 100

## 2015-09-02 MED ORDER — SODIUM CHLORIDE 0.9 % IV SOLN
INTRAVENOUS | Status: DC
  Administered 2015-09-02: 19:00:00 via INTRAVENOUS

## 2015-09-02 MED ORDER — PROPOFOL BOLUS VIA INFUSION: 20.0000 mg | Freq: Once | INTRAVENOUS | Status: DC

## 2015-09-02 MED ORDER — ETOMIDATE 2 MG/ML IV SOLN
20.0000 mg | Freq: Once | INTRAVENOUS | Status: AC
  Administered 2015-09-02: 20 mg via INTRAVENOUS

## 2015-09-02 MED ORDER — LACTATED RINGERS IV SOLN
INTRAVENOUS | Status: DC
Start: 2015-09-02 — End: 2015-09-02

## 2015-09-02 MED ORDER — SODIUM CHLORIDE 0.9 % IV BOLUS (SEPSIS)
1000.0000 mL | Freq: Once | INTRAVENOUS | Status: AC
  Administered 2015-09-02: 1000 mL via INTRAVENOUS

## 2015-09-02 MED ORDER — PROPOFOL 10 MG/ML IV BOLUS: 20.0000 mg | Freq: Once | INTRAVENOUS | Status: DC

## 2015-09-02 MED ORDER — LORAZEPAM 2 MG/ML IJ SOLN
2.0000 mg | Freq: Once | INTRAMUSCULAR | Status: AC
  Administered 2015-09-02: 2 mg via INTRAVENOUS

## 2015-09-02 MED ORDER — ROCURONIUM BROMIDE 50 MG/5ML IV SOLN
100.0000 mg | Freq: Once | INTRAVENOUS | Status: AC
  Administered 2015-09-02: 100 mg via INTRAVENOUS

## 2015-09-02 MED ORDER — SODIUM CHLORIDE 0.9 % IV SOLN: 250.0000 mL | INTRAVENOUS | Status: DC | PRN

## 2015-09-02 MED ORDER — ASPIRIN 300 MG RE SUPP: 300.0000 mg | RECTAL | Status: AC

## 2015-09-02 MED ORDER — ANTISEPTIC ORAL RINSE SOLUTION (CORINZ)
7.0000 mL | Freq: Four times a day (QID) | OROMUCOSAL | Status: DC
  Administered 2015-09-02 – 2015-09-06 (×16): 7 mL via OROMUCOSAL

## 2015-09-02 MED ORDER — AMLODIPINE BESYLATE 10 MG PO TABS
10.0000 mg | ORAL_TABLET | Freq: Every day | ORAL | Status: DC
  Administered 2015-09-04 – 2015-09-08 (×3): 10 mg via ORAL
  Filled 2015-09-02 (×6): qty 1

## 2015-09-02 MED ORDER — INSULIN REGULAR HUMAN 100 UNIT/ML IJ SOLN
INTRAMUSCULAR | Status: DC
  Administered 2015-09-02: 16.2 [IU]/h via INTRAVENOUS
  Administered 2015-09-03: 5 [IU]/h via INTRAVENOUS
  Filled 2015-09-02: qty 2.5

## 2015-09-02 MED ORDER — ASPIRIN 81 MG PO CHEW: 324.0000 mg | CHEWABLE_TABLET | ORAL | Status: AC

## 2015-09-02 MED ORDER — ATORVASTATIN CALCIUM 40 MG PO TABS
40.0000 mg | ORAL_TABLET | Freq: Every day | ORAL | Status: DC
  Administered 2015-09-03 – 2015-09-07 (×4): 40 mg via ORAL
  Filled 2015-09-02 (×5): qty 1

## 2015-09-02 MED ORDER — LORAZEPAM 2 MG/ML IJ SOLN
INTRAMUSCULAR | Status: AC
  Filled 2015-09-02: qty 1

## 2015-09-02 MED ORDER — DEXTROSE-NACL 5-0.45 % IV SOLN
INTRAVENOUS | Status: DC
  Administered 2015-09-03: 12:00:00 via INTRAVENOUS
  Administered 2015-09-03 – 2015-09-04 (×3): 125 mL/h via INTRAVENOUS

## 2015-09-02 MED ORDER — FAMOTIDINE 40 MG/5ML PO SUSR: 20.0000 mg | Freq: Two times a day (BID) | ORAL | Status: DC

## 2015-09-02 MED ORDER — LACTATED RINGERS IV BOLUS (SEPSIS): 2000.0000 mL | Freq: Once | INTRAVENOUS | Status: DC

## 2015-09-02 MED ORDER — DOXYCYCLINE HYCLATE 100 MG IV SOLR
100.0000 mg | Freq: Once | INTRAVENOUS | Status: DC
  Filled 2015-09-02: qty 100

## 2015-09-02 MED ORDER — HEPARIN SODIUM (PORCINE) 5000 UNIT/ML IJ SOLN: 5000.0000 [IU] | Freq: Three times a day (TID) | INTRAMUSCULAR | Status: DC

## 2015-09-02 MED ORDER — LACTATED RINGERS IV SOLN
INTRAVENOUS | Status: AC
  Administered 2015-09-02: 125 mL/h via INTRAVENOUS

## 2015-09-02 MED ORDER — SODIUM CHLORIDE 0.9 % IV SOLN: INTRAVENOUS | Status: DC

## 2015-09-02 NOTE — ED Provider Notes (Signed)
CSN: [id number]     Arrival date & time 09/02/15  1622 History   First MD Initiated Contact with Patient 09/02/15 1640     Chief Complaint  Patient presents with  . Seizures  . Altered Mental Status  . Fall     (Consider location/radiation/quality/duration/timing/severity/associated sxs/prior Treatment) The history is provided by the EMS personnel.     55 year old male with past medical history of hypertension, diabetes, and chronic kidney disease as well as poorly controlled diabetes who presents with altered mental status. Per report from EMS, as the patient is unresponsive, the patient was in his usual state of health until earlier this evening when his wife found him unresponsive on the floor. He had mild abrasions on his face at that time. EMS was called to the scene. On their arrival, the patient was lethargic and upon placing the patient in the ambulance, the patient had a generalized tonic-clonic seizure. He was given Versed. He has remained minimally responsive since then with sonorous respirations. Point of care glucose was greater than 500 on EMS assessment. Patient has other wise been tachycardic and hypertensive. He has not been hypoxic.  Past Medical History  Diagnosis Date  . Diabetes mellitus without complication ([place])     followed by Dr Chalmers Cater  . Hypertension     followed by [facility]  . Chronic kidney disease    Past Surgical History  Procedure Laterality Date  . Bronchoscopy  02/14/2015    for pulm hemorrhage   Family History  Problem Relation Age of Onset  . Diabetes Mother    Social History  Substance Use Topics  . Smoking status: Former Smoker -- 0.00 packs/day for 4 years    Quit date: 02/23/1983  . Smokeless tobacco: None  . Alcohol Use: No    Review of Systems  Unable to perform ROS: Patient unresponsive      Allergies  Review of patient's allergies indicates no known allergies.  Home Medications   Prior to Admission  medications   Medication Sig Start Date End Date Taking? Authorizing Provider  amLODipine (NORVASC) 10 MG tablet Take 1 tablet (10 mg total) by mouth daily. Stop lisinopril . 07/20/15   Posey Boyer, MD  aspirin EC 325 MG tablet Take 1 tablet (325 mg total) by mouth daily. 02/22/15   Mercy Riding, MD  atorvastatin (LIPITOR) 40 MG tablet Take 1 tablet (40 mg total) by mouth daily at 6 PM. 07/20/15   Posey Boyer, MD  carvedilol (COREG) 6.25 MG tablet Take 1 tablet (6.25 mg total) by mouth 2 (two) times daily with a meal. 07/20/15   Posey Boyer, MD  glipiZIDE (GLUCOTROL) 5 MG tablet Take 2 pills in the morning and one in the evening for diabetes 07/20/15   Posey Boyer, MD  Insulin Glargine (LANTUS SOLOSTAR) 100 UNIT/ML Solostar Pen Inject 60 units subcutaneously at bedtime. 07/20/15   Posey Boyer, MD   BP 114/62 mmHg  Pulse 78  Temp(Src) 98.9 F (37.2 C) (Oral)  Resp 26  Ht 5\' 10"  (1.778 m)  Wt 69.6 kg  BMI 22.02 kg/m2  SpO2 100% Physical Exam  Constitutional:  Patient being ventilated via bag-valve-mask. Poorly responsive.  HENT:  Superficial abrasions on forehead and left nose. No lacerations. No obvious facial deformity and midface stable  Eyes: Conjunctivae are normal.  Right cornea with white ulceration along temporal iris and pupil is nonreactive. Left pupil 5 mm reactive.  Neck: Neck supple. No tracheal  deviation present.  Hard cervical collar in place  Cardiovascular: Normal heart sounds and intact distal pulses.  Exam reveals no friction rub.   No murmur heard. Tachycardia  Pulmonary/Chest: Breath sounds normal. He is in respiratory distress. He has no wheezes. He has no rales.  Abdominal: Soft. He exhibits no distension.  Musculoskeletal: He exhibits edema.  Neurological: He exhibits normal muscle tone. GCS eye subscore is 1. GCS verbal subscore is 1. GCS motor subscore is 1.  Minimally responsive. No purposeful movement noted. Absence of any corneal reflex on the right  but intact on the left.  Skin: Skin is warm.  Nursing note and vitals reviewed.   ED Course  .Intubation Date/Time: 09/03/2015 4:04 AM Performed by: Duffy Bruce Authorized by: Duffy Bruce Consent: The procedure was performed in an emergent situation. Patient identity confirmed: arm band Time out: Immediately prior to procedure a "time out" was called to verify the correct patient, procedure, equipment, support staff and site/side marked as required. Indications: airway protection and  respiratory distress Intubation method: video-assisted Patient status: paralyzed (RSI) Preoxygenation: nonrebreather mask Sedatives: etomidate Paralytic: rocuronium Tube size: 7.5 mm Tube type: cuffed Number of attempts: 1 Cords visualized: yes Post-procedure assessment: chest rise,  CO2 detector and ETCO2 monitor Breath sounds: equal and absent over the epigastrium Cuff inflated: yes Tube secured with: ETT holder Chest x-ray interpreted by me. Chest x-ray findings: endotracheal tube in appropriate position Patient tolerance: Patient tolerated the procedure well with no immediate complications   (including critical care time) Labs Review Labs Reviewed  COMPREHENSIVE METABOLIC PANEL - Abnormal; Notable for the following:    Sodium 121 (*)    Chloride 86 (*)    CO2 8 (*)    Glucose, Bld 1142 (*)    BUN 71 (*)    Creatinine, Ser 5.29 (*)    ALT 12 (*)    GFR calc non Af Amer 11 (*)    GFR calc Af Amer 13 (*)    Anion gap 27 (*)    All other components within normal limits  URINALYSIS, ROUTINE W REFLEX MICROSCOPIC (NOT AT [facility]) - Abnormal; Notable for the following:    APPearance CLOUDY (*)    Glucose, UA >1000 (*)    Hgb urine dipstick MODERATE (*)    Ketones, ur 15 (*)    Protein, ur 100 (*)    All other components within normal limits  CBC WITH DIFFERENTIAL/PLATELET - Abnormal; Notable for the following:    WBC 23.8 (*)    Hemoglobin 12.4 (*)    HCT 33.2 (*)    MCV 76.3 (*)     MCHC 37.3 (*)    Neutro Abs 22.0 (*)    Monocytes Absolute 1.3 (*)    All other components within normal limits  AMMONIA - Abnormal; Notable for the following:    Ammonia 37 (*)    All other components within normal limits  LACTIC ACID, PLASMA - Abnormal; Notable for the following:    Lactic Acid, Venous 4.6 (*)    All other components within normal limits  LACTIC ACID, PLASMA - Abnormal; Notable for the following:    Lactic Acid, Venous 3.3 (*)    All other components within normal limits  BLOOD GAS, ARTERIAL - Abnormal; Notable for the following:    pH, Arterial 7.247 (*)    pO2, Arterial 150 (*)    Bicarbonate 16.0 (*)    Acid-base deficit 9.9 (*)    All other components within normal limits  TROPONIN I - Abnormal; Notable for the following:    Troponin I 0.13 (*)    All other components within normal limits  CBC WITH DIFFERENTIAL/PLATELET - Abnormal; Notable for the following:    WBC 23.8 (*)    HCT 34.5 (*)    MCV 76.2 (*)    MCHC 38.0 (*)    Neutro Abs 21.8 (*)    All other components within normal limits  BASIC METABOLIC PANEL - Abnormal; Notable for the following:    Sodium 128 (*)    Chloride 100 (*)    CO2 13 (*)    Glucose, Bld 606 (*)    BUN 72 (*)    Creatinine, Ser 4.80 (*)    Calcium 8.0 (*)    GFR calc non Af Amer 12 (*)    GFR calc Af Amer 14 (*)    All other components within normal limits  PHOSPHORUS - Abnormal; Notable for the following:    Phosphorus 2.0 (*)    All other components within normal limits  PHOSPHORUS - Abnormal; Notable for the following:    Phosphorus 1.3 (*)    All other components within normal limits  BETA-HYDROXYBUTYRIC ACID - Abnormal; Notable for the following:    Beta-Hydroxybutyric Acid 0.34 (*)    All other components within normal limits  URINE MICROSCOPIC-ADD ON - Abnormal; Notable for the following:    Squamous Epithelial / LPF 0-5 (*)    Bacteria, UA RARE (*)    All other components within normal limits  CBC -  Abnormal; Notable for the following:    WBC 15.0 (*)    RBC 3.47 (*)    Hemoglobin 9.8 (*)    HCT 25.9 (*)    MCV 74.6 (*)    MCHC 37.8 (*)    All other components within normal limits  COMPREHENSIVE METABOLIC PANEL - Abnormal; Notable for the following:    Sodium 133 (*)    Potassium 3.4 (*)    CO2 17 (*)    Glucose, Bld 496 (*)    BUN 70 (*)    Creatinine, Ser 4.92 (*)    Calcium 8.5 (*)    Total Protein 6.0 (*)    Albumin 2.9 (*)    ALT 12 (*)    Total Bilirubin 0.2 (*)    GFR calc non Af Amer 12 (*)    GFR calc Af Amer 14 (*)    All other components within normal limits  CK TOTAL AND CKMB (NOT AT [facility]) - Abnormal; Notable for the following:    CK, MB 11.5 (*)    Relative Index 8.3 (*)    All other components within normal limits  I-STAT CHEM 8, ED - Abnormal; Notable for the following:    Sodium 123 (*)    Chloride 89 (*)    BUN 68 (*)    Creatinine, Ser 4.90 (*)    Glucose, Bld >700 (*)    All other components within normal limits  I-STAT TROPOININ, ED - Abnormal; Notable for the following:    Troponin i, poc 0.16 (*)    All other components within normal limits  I-STAT CG4 LACTIC ACID, ED - Abnormal; Notable for the following:    Lactic Acid, Venous 13.68 (*)    All other components within normal limits  I-STAT ARTERIAL BLOOD GAS, ED - Abnormal; Notable for the following:    pH, Arterial 7.080 (*)    pCO2 arterial 53.0 (*)    Bicarbonate 15.7 (*)  Acid-base deficit 14.0 (*)    All other components within normal limits  CBG MONITORING, ED - Abnormal; Notable for the following:    Glucose-Capillary >600 (*)    All other components within normal limits  CBG MONITORING, ED - Abnormal; Notable for the following:    Glucose-Capillary >600 (*)    All other components within normal limits  CBG MONITORING, ED - Abnormal; Notable for the following:    Glucose-Capillary >600 (*)    All other components within normal limits  MRSA PCR SCREENING  CULTURE, BLOOD  (ROUTINE X 2)  CULTURE, BLOOD (ROUTINE X 2)  URINE CULTURE  MAGNESIUM  MAGNESIUM  CBC WITH DIFFERENTIAL/PLATELET  LACTIC ACID, PLASMA  URINALYSIS, ROUTINE W REFLEX MICROSCOPIC (NOT AT [facility])  TROPONIN I  TROPONIN I  BASIC METABOLIC PANEL  BASIC METABOLIC PANEL  MAGNESIUM  MAGNESIUM  PHOSPHORUS  PHOSPHORUS  BASIC METABOLIC PANEL  MAGNESIUM  PHOSPHORUS  PHOSPHORUS  PHOSPHORUS  CBC  PROTIME-INR  APTT  I-STAT ARTERIAL BLOOD GAS, ED  I-STAT ARTERIAL BLOOD GAS, ED    Imaging Review Ct Head Wo Contrast  09/02/2015  CLINICAL DATA:  Patient found by family on the floor. Recent seizure. EXAM: CT HEAD WITHOUT CONTRAST CT CERVICAL SPINE WITHOUT CONTRAST TECHNIQUE: Multidetector CT imaging of the head and cervical spine was performed following the standard protocol without intravenous contrast. Multiplanar CT image reconstructions of the cervical spine were also generated. COMPARISON:  MRI brain 02/18/2015 FINDINGS: CT HEAD FINDINGS Ventricles and sulci are appropriate for patient's age. No evidence for acute cortically based infarct, intracranial hemorrhage, mass lesion or mass-effect. Orbits are unremarkable. Small amount of fluid within the left maxillary sinus. Remainder of the paranasal sinuses are unremarkable. Mastoid air cells are well aerated. Calvarium is intact. CT CERVICAL SPINE FINDINGS Normal anatomic alignment. No evidence for acute fracture or dislocation. Preservation of the vertebral body and intervertebral disc space heights. Craniocervical junction is intact. Lung apices are unremarkable. ET tube terminates in the distal trachea visualized thyroid is unremarkable. IMPRESSION: No acute intracranial process. No acute cervical spine fracture. Electronically Signed   By: Lovey Newcomer M.D.   On: 09/02/2015 17:24   Ct Cervical Spine Wo Contrast  09/02/2015  CLINICAL DATA:  Patient found by family on the floor. Recent seizure. EXAM: CT HEAD WITHOUT CONTRAST CT CERVICAL SPINE WITHOUT  CONTRAST TECHNIQUE: Multidetector CT imaging of the head and cervical spine was performed following the standard protocol without intravenous contrast. Multiplanar CT image reconstructions of the cervical spine were also generated. COMPARISON:  MRI brain 02/18/2015 FINDINGS: CT HEAD FINDINGS Ventricles and sulci are appropriate for patient's age. No evidence for acute cortically based infarct, intracranial hemorrhage, mass lesion or mass-effect. Orbits are unremarkable. Small amount of fluid within the left maxillary sinus. Remainder of the paranasal sinuses are unremarkable. Mastoid air cells are well aerated. Calvarium is intact. CT CERVICAL SPINE FINDINGS Normal anatomic alignment. No evidence for acute fracture or dislocation. Preservation of the vertebral body and intervertebral disc space heights. Craniocervical junction is intact. Lung apices are unremarkable. ET tube terminates in the distal trachea visualized thyroid is unremarkable. IMPRESSION: No acute intracranial process. No acute cervical spine fracture. Electronically Signed   By: Lovey Newcomer M.D.   On: 09/02/2015 17:24   Mr Brain Wo Contrast  09/02/2015  CLINICAL DATA:  Code stroke. Unresponsive. Evaluate for basilar thrombosis. EXAM: MRA HEAD WITHOUT CONTRAST TECHNIQUE: Angiographic images of the Circle of Willis were obtained using MRA technique without intravenous contrast. COMPARISON:  Head CT from earlier today.  MRI 02/18/2015 FINDINGS: Axial diffusion was performed and is negative for acute infarct. Symmetric carotid arteries at the upper cervical segment. Extensive atherosclerotic irregularity of the bilateral siphons consistent with atheromatous disease. There is a new flow gap at the anterior genu of the right ICA with susceptibility artifact that does not correlate with metal, stent, or gas on prior head CT. There is also poor signal at the left carotid terminus extending into the A1 segment. Beyond these apparent stenoses, there is  robust and symmetric flow related signal. Dominant right vertebral artery. Extensive atherosclerotic irregularity of the left V4 segment, likely occluded distally on a chronic basis .There is atheromatous narrowing of the left proximal PICA. Atheromatous narrowing of the right P1 P2 segment that is mild. No major vessel occlusion in the vertebrobasilar distribution. IMPRESSION: 1. Negative for basilar thrombosis.  No acute infarct. 2. Apparent high-grade stenosis at the left carotid terminus and right cavernous carotid are suspicious for artifact given rapid development since 02/18/2015 study and associated susceptibility artifact. Consider follow-up or CTA depending on clinical circumstances. 3. 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 Electronically Signed   By: Monte Fantasia M.D.   On: 09/02/2015 18:35   Dg Chest Port 1 View  09/02/2015  CLINICAL DATA:  Patient status post ET tube placement. EXAM: PORTABLE CHEST 1 VIEW COMPARISON:  Chest radiograph 02/20/2015 FINDINGS: ET tube terminates in the distal trachea. Monitoring leads overlie the patient. Stable enlarged cardiac and mediastinal contours. Elevation right hemidiaphragm. Heterogeneous opacities right lung base. No pleural effusion or pneumothorax. IMPRESSION: ET tube terminates in the distal trachea. Elevation right hemidiaphragm. Heterogeneous opacities right lung base may represent atelectasis, aspiration or infection. Electronically Signed   By: Lovey Newcomer M.D.   On: 09/02/2015 17:33   Dg Abd Portable 1v  09/02/2015  CLINICAL DATA:  Enteric tube placed EXAM: PORTABLE ABDOMEN - 1 VIEW COMPARISON:  02/14/2015 abdominal radiograph FINDINGS: Enteric tube terminates in the distal stomach. Mild gaseous distention of the stomach. No dilated small bowel loops in the visualized upper abdomen. No evidence of pneumatosis or pneumoperitoneum. IMPRESSION: Enteric tube terminates in the distal stomach.  Electronically Signed   By: Ilona Sorrel M.D.   On: 09/02/2015 22:17   I have personally reviewed and evaluated these images and lab results as part of my medical decision-making.   EKG Interpretation   Date/Time:  Saturday September 02 2015 17:10:25 EDT Ventricular Rate:  139 PR Interval:  137 QRS Duration: 99 QT Interval:  310 QTC Calculation: 471 R Axis:   42 Text Interpretation:  Sinus tachycardia LAE, consider biatrial enlargement  LVH with secondary repolarization abnormality ST depression, consider  ischemia, diffuse lds Anterior ST elevation, probably due to LVH No  significant change since last tracing Confirmed by Maryan Rued  MD, Loree Fee  [phone]) on 09/02/2015 6:16:31 PM      MDM  55 year old male with past medical history of hypertension, diabetes, hyperlipidemia presents with new onset seizures and altered mental status with hyperglycemia. Initial differential is broad and includes intracranial hemorrhage, stroke, brain mass/lesion, also must consider metabolic encephalopathy secondary to hyperglycemia, DKA, or HHS. Will start IV fluids and sent for stat CT head. Prior to arrival, patient was activated as a code stroke and neurology is at bedside and is in agreement. Patient taken to CT scanner following intubation, which patient tolerated well. Otherwise, IV fluids started and peripheral IVs established. Blood pressure  stable.   Labs and imaging reviewed as above. CBC with leukocytosis of 23.8k. CMP shows pseudohyponatremia, acidemia (CO2 8), marked hyperglycemia of 1142, and AKI with BUN 71, Cr 5.29. AG 27, concerning for DKA and pH 7.08 on ABG. LA 14. Trop 0.16, likely demand. EKG non-ischemic. UA pending. CT Head/C-Spine negative, and MRI Head neg for acute abnormality. Suspect metabolic encephalopathy 2/2 DKA versus HHS. Neuro has ordered keppra IV. Pt has been given 2L NS. Given concern for possible cerebral edema contributing to seizures, with normal to elevated BP, will start  mIVF with insulin drip for DKA/HHS. ABX given for possible PNA on CXR, though do not feel this is primary etiology for presentation. Family updated and aware of pt's critical condition.  HR, BP improving with insulin gtt, fluids, ABX. Will admit to ICU.  Clinical Impression: 1. Diabetic ketoacidosis with coma associated with type 2 diabetes mellitus ([place])   2. Unresponsive   3. Encounter for orogastric (OG) tube placement   4. Acute on chronic kidney failure ([place])   5. Lactic acidosis   6. Acute respiratory failure with hypercapnia ([facility])   7. Seizure-like activity ([place])   8. Hyperglycemia   9. Acidemia   10. Leukocytosis   11. Endotracheally intubated     Disposition: Admit  Condition: Critical  Pt seen in conjunction with Dr. Wilmer Floor, MD 09/03/15 [id number]  Blanchie Dessert, MD 09/04/15 [id number]

## 2015-09-02 NOTE — Progress Notes (Signed)
RT transported pt to CT and returned to trauma A. RT then transported pt to MRI. No complications. Vital signs stable at this time. RN at bedside. RT will continue to monitor.

## 2015-09-02 NOTE — H&P (Signed)
PULMONARY / CRITICAL CARE MEDICINE   Name: Russell Price MRN: [id number] DOB: 11-18-1960    ADMISSION DATE:  09/02/2015  CHIEF COMPLAINT:  Seizure  HISTORY OF PRESENT ILLNESS:   55 yo male with h/o CKD-4, HLD, uncontrolled DM, right eye blindness who presents to the hospital BIBA after found unresponsive by wife.  The patient is sedated and the HPI is provided by his wife, who states that over the last few days he had been feeling weak and fatigued.  Today he felt the same but was able to function normally, she saw him normal about 1-130pm then she went out for about 30 min or so, upon her return he was unresponsive.  She did not notice and shaking at that time but did notice 2 lacerations on his head.    Per EMS report, en route he was noted to have some extensor posturing and there was suspicion for seizure activity, upon arrival he was minimally responsive and was intubated in the ED for airway protection.     PAST MEDICAL HISTORY :   has a past medical history of Diabetes mellitus without complication ([place]); Hypertension; and Chronic kidney disease.  has past surgical history that includes Bronchoscopy (02/14/2015). Prior to Admission medications   Medication Sig Start Date End Date Taking? Authorizing Provider  amLODipine (NORVASC) 10 MG tablet Take 1 tablet (10 mg total) by mouth daily. Stop lisinopril . 07/20/15   Posey Boyer, MD  aspirin EC 325 MG tablet Take 1 tablet (325 mg total) by mouth daily. 02/22/15   Mercy Riding, MD  atorvastatin (LIPITOR) 40 MG tablet Take 1 tablet (40 mg total) by mouth daily at 6 PM. 07/20/15   Posey Boyer, MD  carvedilol (COREG) 6.25 MG tablet Take 1 tablet (6.25 mg total) by mouth 2 (two) times daily with a meal. 07/20/15   Posey Boyer, MD  glipiZIDE (GLUCOTROL) 5 MG tablet Take 2 pills in the morning and one in the evening for diabetes 07/20/15   Posey Boyer, MD  Insulin Glargine (LANTUS SOLOSTAR) 100 UNIT/ML Solostar Pen Inject 60 units  subcutaneously at bedtime. 07/20/15   Posey Boyer, MD   No Known Allergies  FAMILY HISTORY:  indicated that his mother is alive. He indicated that his father is alive.  SOCIAL HISTORY:  reports that he quit smoking about 32 years ago. He does not have any smokeless tobacco history on file. He reports that he does not drink alcohol or use illicit drugs.  REVIEW OF SYSTEMS:   Per wife: Negative for: Fevers, chills, chest pain, SOB, DOE, n/v/d, weakness, visual changes, slurred speech, drooling, cough,dysuria. Positive for: fatigue, malaise, weakness, loss of consciousness, encephalopathy  SUBJECTIVE:   VITAL SIGNS: Pulse Rate:  [102-123] 102 (04/15 1930) Resp:  [16-26] 23 (04/15 1930) BP: (120-210)/(73-106) 172/76 mmHg (04/15 1930) SpO2:  [96 %-100 %] 100 % (04/15 1930) FiO2 (%):  [100 %] 100 % (04/15 1641) Weight:  [75.751 kg (167 lb)] 75.751 kg (167 lb) (04/15 1700) HEMODYNAMICS:   VENTILATOR SETTINGS: Vent Mode:  [-] PRVC FiO2 (%):  [100 %] 100 % Set Rate:  [16 bmp-26 bmp] 26 bmp Vt Set:  [560 mL] 560 mL PEEP:  [5 cmH20] 5 cmH20 Plateau Pressure:  [15 cmH20] 15 cmH20 INTAKE / OUTPUT: No intake or output data in the 24 hours ending 09/02/15 1952  PHYSICAL EXAMINATION: General:  Sedated Neuro:  Sedated on propofol HEENT:  Lacerations on central forehead and bridge of the  nose.  Left pupil 41mm reactive, right not active (wife states previous surgery) Cardiovascular:  Tachycardic, RRR, no m/r/g Lungs:  Mechanical breath sounds b/l, no w/r/r Abdomen:  Soft, non-tender, non distended, normal bowel sounds Musculoskeletal:  Normal bulk and tone Skin:  Lacerations as above.  No c/c/e  LABS:  CBC  Recent Labs Lab 09/02/15 1634  HGB 13.6  HCT 40.0   Coag's No results for input(s): APTT, INR in the last 168 hours. BMET  Recent Labs Lab 09/02/15 1630 09/02/15 1634  NA 121* 123*  K 4.3 4.2  CL 86* 89*  CO2 8*  --   BUN 71* 68*  CREATININE 5.29* 4.90*  GLUCOSE  1142* >700*   Electrolytes  Recent Labs Lab 09/02/15 1630  CALCIUM 9.3   Sepsis Markers  Recent Labs Lab 09/02/15 1639  LATICACIDVEN 13.68*   ABG  Recent Labs Lab 09/02/15 1831  PHART 7.080*  PCO2ART 53.0*  PO2ART 95.0   Liver Enzymes  Recent Labs Lab 09/02/15 1630  AST 24  ALT 12*  ALKPHOS 125  BILITOT 0.5  ALBUMIN 3.5   Cardiac Enzymes No results for input(s): TROPONINI, PROBNP in the last 168 hours. Glucose  Recent Labs Lab 09/02/15 1845  GLUCAP >600*    Imaging Ct Head Wo Contrast  09/02/2015  CLINICAL DATA:  Patient found by family on the floor. Recent seizure. EXAM: CT HEAD WITHOUT CONTRAST CT CERVICAL SPINE WITHOUT CONTRAST TECHNIQUE: Multidetector CT imaging of the head and cervical spine was performed following the standard protocol without intravenous contrast. Multiplanar CT image reconstructions of the cervical spine were also generated. COMPARISON:  MRI brain 02/18/2015 FINDINGS: CT HEAD FINDINGS Ventricles and sulci are appropriate for patient's age. No evidence for acute cortically based infarct, intracranial hemorrhage, mass lesion or mass-effect. Orbits are unremarkable. Small amount of fluid within the left maxillary sinus. Remainder of the paranasal sinuses are unremarkable. Mastoid air cells are well aerated. Calvarium is intact. CT CERVICAL SPINE FINDINGS Normal anatomic alignment. No evidence for acute fracture or dislocation. Preservation of the vertebral body and intervertebral disc space heights. Craniocervical junction is intact. Lung apices are unremarkable. ET tube terminates in the distal trachea visualized thyroid is unremarkable. IMPRESSION: No acute intracranial process. No acute cervical spine fracture. Electronically Signed   By: Lovey Newcomer M.D.   On: 09/02/2015 17:24   Ct Cervical Spine Wo Contrast  09/02/2015  CLINICAL DATA:  Patient found by family on the floor. Recent seizure. EXAM: CT HEAD WITHOUT CONTRAST CT CERVICAL SPINE  WITHOUT CONTRAST TECHNIQUE: Multidetector CT imaging of the head and cervical spine was performed following the standard protocol without intravenous contrast. Multiplanar CT image reconstructions of the cervical spine were also generated. COMPARISON:  MRI brain 02/18/2015 FINDINGS: CT HEAD FINDINGS Ventricles and sulci are appropriate for patient's age. No evidence for acute cortically based infarct, intracranial hemorrhage, mass lesion or mass-effect. Orbits are unremarkable. Small amount of fluid within the left maxillary sinus. Remainder of the paranasal sinuses are unremarkable. Mastoid air cells are well aerated. Calvarium is intact. CT CERVICAL SPINE FINDINGS Normal anatomic alignment. No evidence for acute fracture or dislocation. Preservation of the vertebral body and intervertebral disc space heights. Craniocervical junction is intact. Lung apices are unremarkable. ET tube terminates in the distal trachea visualized thyroid is unremarkable. IMPRESSION: No acute intracranial process. No acute cervical spine fracture. Electronically Signed   By: Lovey Newcomer M.D.   On: 09/02/2015 17:24   Mr Brain Wo Contrast  09/02/2015  CLINICAL DATA:  Code stroke. Unresponsive. Evaluate for basilar thrombosis. EXAM: MRA HEAD WITHOUT CONTRAST TECHNIQUE: Angiographic images of the Circle of Willis were obtained using MRA technique without intravenous contrast. COMPARISON:  Head CT from earlier today.  MRI 02/18/2015 FINDINGS: Axial diffusion was performed and is negative for acute infarct. Symmetric carotid arteries at the upper cervical segment. Extensive atherosclerotic irregularity of the bilateral siphons consistent with atheromatous disease. There is a new flow gap at the anterior genu of the right ICA with susceptibility artifact that does not correlate with metal, stent, or gas on prior head CT. There is also poor signal at the left carotid terminus extending into the A1 segment. Beyond these apparent stenoses,  there is robust and symmetric flow related signal. Dominant right vertebral artery. Extensive atherosclerotic irregularity of the left V4 segment, likely occluded distally on a chronic basis .There is atheromatous narrowing of the left proximal PICA. Atheromatous narrowing of the right P1 P2 segment that is mild. No major vessel occlusion in the vertebrobasilar distribution. IMPRESSION: 1. Negative for basilar thrombosis.  No acute infarct. 2. Apparent high-grade stenosis at the left carotid terminus and right cavernous carotid are suspicious for artifact given rapid development since 02/18/2015 study and associated susceptibility artifact. Consider follow-up or CTA depending on clinical circumstances. 3. 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 Electronically Signed   By: Monte Fantasia M.D.   On: 09/02/2015 18:35   Dg Chest Port 1 View  09/02/2015  CLINICAL DATA:  Patient status post ET tube placement. EXAM: PORTABLE CHEST 1 VIEW COMPARISON:  Chest radiograph 02/20/2015 FINDINGS: ET tube terminates in the distal trachea. Monitoring leads overlie the patient. Stable enlarged cardiac and mediastinal contours. Elevation right hemidiaphragm. Heterogeneous opacities right lung base. No pleural effusion or pneumothorax. IMPRESSION: ET tube terminates in the distal trachea. Elevation right hemidiaphragm. Heterogeneous opacities right lung base may represent atelectasis, aspiration or infection. Electronically Signed   By: Lovey Newcomer M.D.   On: 09/02/2015 17:33     ASSESSMENT / PLAN: 55 yo male admitted with acute encephalopathy and possible seizure 2/2 DKA, unclear precipitant but it appears the patient's DM has been poorly controlled for some time.   PULMONARY A: - Intubated for airway protection - Iatrogenic respiratory acidosis P:   - CXR with ETT in appropriate location - ABG with respiratory and metabolic acidosis - increase RR on  ventilator - Oral Care - PPI - VAP prevention management bundle - daily SBT   CARDIOVASCULAR  A:  -HTN - Hypovolemia - mild troponin elevation - likely 2/2 hypovolemia P:  - Restart home carvedilol 6.25 bid - restart home amlodipine 10mg  Qday - volume rescucitation s/p 2L NS - change NS to LR at 125cc/hr - trend trops - check EKG  RENAL A:   - CKD 4 (baseline Scr between 3 and 5) - Uremia - chronic - hyperosmolar hyponatremia (corrected Sodium on admission was 137) - AGMA - 2/2 lactate and  BUN - NAGMA - 2/2 CKD - Lactic acidemia P:   - currently at baseline Scr - UOP currently adequate - avoid nephrotoxins - Maintain UOP >0.5cc/kg/hr - Slow glucose correction - trend lactate - s/p volume resucitation as above - change fluid to LR @125cc /hr - Serum CK pending  GASTROINTESTINAL A:   - no active issues at present time P:   - check LFTS - PPI  HEMATOLOGIC A:   - not acitve  P:  - CBC pending  INFECTIOUS A:   Not active at this time - no obvious source P:   BCx2 09/02/2015 UC 09/02/2015 Sputum 09/02/2015 Abx: none,   ENDOCRINE A:   - Severe DKA - life threatening - DM - HLD   P:   - DKA protocol - LR fluid resuscitation - insulin gtt - POC BG checks - potassium/phos monitoring - electrolyte repletion protocol  NEUROLOGIC A:   - Acute encephalopathy - Possible provoked seizure P:   RASS goal: 0 - CT head and neck unremarkable - MRI with some possible artifact - CTA if further defects progress - propofol gtt - neurology consulted - appreciate recs - LTM     FAMILY  - Updates: spoke with wife and brother at bedside and gave updates today.   Total critical care time: 45 min  Critical care time was exclusive of separately billable procedures and treating other patients.  Critical care was necessary to treat or prevent imminent or life-threatening deterioration.  Critical care was time spent personally by me on the following  activities: development of treatment plan with patient and/or surrogate as well as nursing, discussions with consultants, evaluation of patient's response to treatment, examination of patient, obtaining history from patient or surrogate, ordering and performing treatments and interventions, ordering and review of laboratory studies, ordering and review of radiographic studies, pulse oximetry and re-evaluation of patient's condition.   Meribeth Mattes, DO., MS [facility]   Pulmonary and [place] Pager: [phone]  09/02/2015, 7:52 PM

## 2015-09-02 NOTE — Progress Notes (Signed)
   09/02/15 1700  Clinical Encounter Type  Visited With Family  Visit Type ED  Referral From Nurse  Spiritual Encounters  Spiritual Needs Emotional;Other (Comment) (Logistical)  Stress Factors  Family Stress Factors Health changes;Lack of knowledge  Secretary called re: wife of patient in Trauma A, sitting outside room and visibly upset. Sat with wife and coordinated arrival of other family and escorted them to consult room B. Took wife to sit with family when patient taken for CT. Offered hospitality, checked in frequently.

## 2015-09-02 NOTE — Consult Note (Signed)
Neurology Consultation Reason for Consult: AMS Referring Physician: Purcell Mouton  CC: Unresponsive.   History is obtained from:PAtient  HPI: Russell Price is a 55 y.o. male with a history of DM who was sleepy, but otherwise relatively unremarkable. His wife left around 1 PM, and when she returned a few hours later she found him completely unresponsive.  There was some report of bilateral extensor posturing en route, reported as possible seizure activity.  On exam here, he was not protecting his airway, he is also noted to have an absent right corneal though it is unclear if this is an old finding given that it is clearly a postsurgical line.  LKW: 1 PM tpa given?: no, not a stroke  ROS:  Unable to obtain due to altered mental status.   Past Medical History  Diagnosis Date  . Diabetes mellitus without complication ([place])     followed by Dr Chalmers Cater  . Hypertension     followed by [facility]  . Chronic kidney disease      Family History  Problem Relation Age of Onset  . Diabetes Mother      Social History:  reports that he quit smoking about 32 years ago. He does not have any smokeless tobacco history on file. He reports that he does not drink alcohol or use illicit drugs.   Exam: Current vital signs:  Vital signs in last 24 hours:     Physical Exam  Constitutional: Appears well-developed and well-nourished.  Psych: Does not respond Eyes: No scleral injection HENT: Being bag mask ventilated, abrasions on his for head Head: Normocephalic.  Cardiovascular: Tachycardic Respiratory: Being bag mask ventilated GI: Soft.  No distension. There is no tenderness.  Skin: WDI  Neuro: Mental Status: Patient is comatose Cranial Nerves: II: Does not blink to threat. Right pupil is postsurgical, left pupil is reactive III,IV, VI: C-collar in place so doll's eye not checked V,VII: Corneal intact on the left, absent on the right Motor:  he has no movement to  noxious stimuli in any extremity Sensory   as above  Cerebellar:  unable to perform     I have reviewed labs in epic and the results pertinent to this consultation are: Glucose 1100  I have reviewed the images obtained:CT head - negaitve.   Impression:   55 year old male with new onset coma. I suspect that the absent corneal the right may be due to his surgical changes, but given this finding he was taken for a stat MRI to rule out basilar thrombosis which is negative. At this time I think that he represents a hyperosmolar, and would treat him as such. Given a report of a seizure, I will give 1 dose of Keppra, but I do not think he will need this long-term.   Recommendations: 1)  stat MRI brain (already performed at the time of this dictation)  2) Keppra 1 g 1 3) we will follow for improvement in mental status   Roland Rack, MD [facility] [phone]  If 7pm- 7am, please page neurology on call as listed in [place].

## 2015-09-02 NOTE — ED Notes (Signed)
Attempted report 

## 2015-09-02 NOTE — ED Notes (Signed)
Received pt from home with c/o found by family on floor. Family last saw pt at 1:30 and pt was normal. Upon arrival of EMS pt was found on floor lying on his back. EMS got pt into truck, pt became combative then had a seizure. Pt given 5 mg of versed by EMS and did BVM.

## 2015-09-02 NOTE — Significant Event (Signed)
Rapid Response Event Note  Overview:  Called to see patient with Code Stroke.     Initial Focused Assessment: Upon arrival patient is being assessed by ED MDs and Neurologist. Breathing being assisted with bag-valve-mask; progressed to intubation.   Interventions: Assisted with transport to CT, return to ED, and transport to MRI.  Event Summary:   RRT call ended at  1730 as patient began MRI study.     at          Baron Hamper

## 2015-09-03 DIAGNOSIS — R569 Unspecified convulsions: Secondary | ICD-10-CM

## 2015-09-03 DIAGNOSIS — N189 Chronic kidney disease, unspecified: Secondary | ICD-10-CM

## 2015-09-03 DIAGNOSIS — E1111 Type 2 diabetes mellitus with ketoacidosis with coma: Secondary | ICD-10-CM | POA: Insufficient documentation

## 2015-09-03 DIAGNOSIS — N179 Acute kidney failure, unspecified: Secondary | ICD-10-CM

## 2015-09-03 DIAGNOSIS — E1311 Other specified diabetes mellitus with ketoacidosis with coma: Principal | ICD-10-CM

## 2015-09-03 DIAGNOSIS — J9602 Acute respiratory failure with hypercapnia: Secondary | ICD-10-CM

## 2015-09-03 LAB — GLUCOSE, CAPILLARY
GLUCOSE-CAPILLARY: 242 mg/dL — AB (ref 65–99)
GLUCOSE-CAPILLARY: 179 mg/dL — AB (ref 65–99)
GLUCOSE-CAPILLARY: 182 mg/dL — AB (ref 65–99)
GLUCOSE-CAPILLARY: 161 mg/dL — AB (ref 65–99)
GLUCOSE-CAPILLARY: 155 mg/dL — AB (ref 65–99)
GLUCOSE-CAPILLARY: 96 mg/dL (ref 65–99)
GLUCOSE-CAPILLARY: 97 mg/dL (ref 65–99)
GLUCOSE-CAPILLARY: 130 mg/dL — AB (ref 65–99)
GLUCOSE-CAPILLARY: 204 mg/dL — AB (ref 65–99)
GLUCOSE-CAPILLARY: 215 mg/dL — AB (ref 65–99)
Glucose-Capillary: 323 mg/dL — ABNORMAL HIGH (ref 65–99)
Glucose-Capillary: 369 mg/dL — ABNORMAL HIGH (ref 65–99)
Glucose-Capillary: 502 mg/dL — ABNORMAL HIGH (ref 65–99)
Glucose-Capillary: 195 mg/dL — ABNORMAL HIGH (ref 65–99)
Glucose-Capillary: 575 mg/dL (ref 65–99)
Glucose-Capillary: 105 mg/dL — ABNORMAL HIGH (ref 65–99)
Glucose-Capillary: 106 mg/dL — ABNORMAL HIGH (ref 65–99)
Glucose-Capillary: 113 mg/dL — ABNORMAL HIGH (ref 65–99)
Glucose-Capillary: 130 mg/dL — ABNORMAL HIGH (ref 65–99)
Glucose-Capillary: 148 mg/dL — ABNORMAL HIGH (ref 65–99)

## 2015-09-03 LAB — PROTIME-INR
INR: 1.29 (ref 0.00–1.49)
PROTHROMBIN TIME: 16.2 s — AB (ref 11.6–15.2)

## 2015-09-03 LAB — COMPREHENSIVE METABOLIC PANEL
ALBUMIN: 2.9 g/dL — AB (ref 3.5–5.0)
ALK PHOS: 90 U/L (ref 38–126)
ALT: 12 U/L — ABNORMAL LOW (ref 17–63)
ANION GAP: 12 (ref 5–15)
AST: 19 U/L (ref 15–41)
BILIRUBIN TOTAL: 0.2 mg/dL — AB (ref 0.3–1.2)
BUN: 70 mg/dL — ABNORMAL HIGH (ref 6–20)
CALCIUM: 8.5 mg/dL — AB (ref 8.9–10.3)
CO2: 17 mmol/L — ABNORMAL LOW (ref 22–32)
Chloride: 104 mmol/L (ref 101–111)
Creatinine, Ser: 4.92 mg/dL — ABNORMAL HIGH (ref 0.61–1.24)
GFR calc non Af Amer: 12 mL/min — ABNORMAL LOW (ref >60)
GFR, EST AFRICAN AMERICAN: 14 mL/min — AB (ref >60)
Glucose, Bld: 496 mg/dL — ABNORMAL HIGH (ref 65–99)
POTASSIUM: 3.4 mmol/L — AB (ref 3.5–5.1)
SODIUM: 133 mmol/L — AB (ref 135–145)
TOTAL PROTEIN: 6 g/dL — AB (ref 6.5–8.1)

## 2015-09-03 LAB — BASIC METABOLIC PANEL
ANION GAP: 15 (ref 5–15)
ANION GAP: 12 (ref 5–15)
ANION GAP: 12 (ref 5–15)
ANION GAP: 15 (ref 5–15)
ANION GAP: 12 (ref 5–15)
BUN: 72 mg/dL — AB (ref 6–20)
BUN: 66 mg/dL — ABNORMAL HIGH (ref 6–20)
BUN: 63 mg/dL — AB (ref 6–20)
BUN: 59 mg/dL — AB (ref 6–20)
BUN: 58 mg/dL — AB (ref 6–20)
CHLORIDE: 100 mmol/L — AB (ref 101–111)
CHLORIDE: 109 mmol/L (ref 101–111)
CHLORIDE: 107 mmol/L (ref 101–111)
CHLORIDE: 108 mmol/L (ref 101–111)
CO2: 13 mmol/L — ABNORMAL LOW (ref 22–32)
CO2: 15 mmol/L — ABNORMAL LOW (ref 22–32)
CO2: 16 mmol/L — AB (ref 22–32)
CO2: 15 mmol/L — AB (ref 22–32)
CO2: 16 mmol/L — AB (ref 22–32)
Calcium: 8 mg/dL — ABNORMAL LOW (ref 8.9–10.3)
Calcium: 8.2 mg/dL — ABNORMAL LOW (ref 8.9–10.3)
Calcium: 8.3 mg/dL — ABNORMAL LOW (ref 8.9–10.3)
Calcium: 8.6 mg/dL — ABNORMAL LOW (ref 8.9–10.3)
Calcium: 8.4 mg/dL — ABNORMAL LOW (ref 8.9–10.3)
Chloride: 109 mmol/L (ref 101–111)
Creatinine, Ser: 4.8 mg/dL — ABNORMAL HIGH (ref 0.61–1.24)
Creatinine, Ser: 4.46 mg/dL — ABNORMAL HIGH (ref 0.61–1.24)
Creatinine, Ser: 4.55 mg/dL — ABNORMAL HIGH (ref 0.61–1.24)
Creatinine, Ser: 4.51 mg/dL — ABNORMAL HIGH (ref 0.61–1.24)
Creatinine, Ser: 4.29 mg/dL — ABNORMAL HIGH (ref 0.61–1.24)
GFR calc Af Amer: 15 mL/min — ABNORMAL LOW (ref >60)
GFR calc Af Amer: 16 mL/min — ABNORMAL LOW (ref >60)
GFR calc non Af Amer: 12 mL/min — ABNORMAL LOW (ref >60)
GFR calc non Af Amer: 13 mL/min — ABNORMAL LOW (ref >60)
GFR calc non Af Amer: 14 mL/min — ABNORMAL LOW (ref >60)
GFR, EST AFRICAN AMERICAN: 14 mL/min — AB (ref >60)
GFR, EST AFRICAN AMERICAN: 16 mL/min — AB (ref >60)
GFR, EST AFRICAN AMERICAN: 15 mL/min — AB (ref >60)
GFR, EST NON AFRICAN AMERICAN: 13 mL/min — AB (ref >60)
GFR, EST NON AFRICAN AMERICAN: 13 mL/min — AB (ref >60)
GLUCOSE: 112 mg/dL — AB (ref 65–99)
GLUCOSE: 159 mg/dL — AB (ref 65–99)
GLUCOSE: 220 mg/dL — AB (ref 65–99)
Glucose, Bld: 606 mg/dL (ref 65–99)
Glucose, Bld: 177 mg/dL — ABNORMAL HIGH (ref 65–99)
POTASSIUM: 3.7 mmol/L (ref 3.5–5.1)
POTASSIUM: 3.3 mmol/L — AB (ref 3.5–5.1)
POTASSIUM: 3 mmol/L — AB (ref 3.5–5.1)
POTASSIUM: 3.2 mmol/L — AB (ref 3.5–5.1)
Potassium: 3.3 mmol/L — ABNORMAL LOW (ref 3.5–5.1)
SODIUM: 128 mmol/L — AB (ref 135–145)
SODIUM: 136 mmol/L (ref 135–145)
Sodium: 137 mmol/L (ref 135–145)
Sodium: 137 mmol/L (ref 135–145)
Sodium: 136 mmol/L (ref 135–145)

## 2015-09-03 LAB — TROPONIN I
TROPONIN I: 1.22 ng/mL — AB (ref <0.031)
Troponin I: 1.36 ng/mL (ref <0.031)
Troponin I: 0.95 ng/mL (ref <0.031)

## 2015-09-03 LAB — CBC
HCT: 23.9 % — ABNORMAL LOW (ref 39.0–52.0)
HEMATOCRIT: 25.9 % — AB (ref 39.0–52.0)
HEMOGLOBIN: 9.8 g/dL — AB (ref 13.0–17.0)
Hemoglobin: 8.8 g/dL — ABNORMAL LOW (ref 13.0–17.0)
MCH: 28.2 pg (ref 26.0–34.0)
MCH: 27.3 pg (ref 26.0–34.0)
MCHC: 36.8 g/dL — AB (ref 30.0–36.0)
MCHC: 37.8 g/dL — AB (ref 30.0–36.0)
MCV: 74.6 fL — ABNORMAL LOW (ref 78.0–100.0)
MCV: 74.2 fL — AB (ref 78.0–100.0)
PLATELETS: 156 10*3/uL (ref 150–400)
Platelets: 174 10*3/uL (ref 150–400)
RBC: 3.47 MIL/uL — ABNORMAL LOW (ref 4.22–5.81)
RBC: 3.22 MIL/uL — ABNORMAL LOW (ref 4.22–5.81)
RDW: 12.6 % (ref 11.5–15.5)
RDW: 12.7 % (ref 11.5–15.5)
WBC: 15 10*3/uL — ABNORMAL HIGH (ref 4.0–10.5)
WBC: 12 10*3/uL — ABNORMAL HIGH (ref 4.0–10.5)

## 2015-09-03 LAB — PHOSPHORUS
PHOSPHORUS: 1.3 mg/dL — AB (ref 2.5–4.6)
PHOSPHORUS: 2 mg/dL — AB (ref 2.5–4.6)
PHOSPHORUS: 3 mg/dL (ref 2.5–4.6)
PHOSPHORUS: 3.2 mg/dL (ref 2.5–4.6)
PHOSPHORUS: 3.5 mg/dL (ref 2.5–4.6)
Phosphorus: 2.9 mg/dL (ref 2.5–4.6)

## 2015-09-03 LAB — LACTIC ACID, PLASMA
LACTIC ACID, VENOUS: 2.3 mmol/L — AB (ref 0.5–2.0)
LACTIC ACID, VENOUS: 1.5 mmol/L (ref 0.5–2.0)
Lactic Acid, Venous: 3.3 mmol/L (ref 0.5–2.0)

## 2015-09-03 LAB — MAGNESIUM
MAGNESIUM: 2 mg/dL (ref 1.7–2.4)
MAGNESIUM: 2.3 mg/dL (ref 1.7–2.4)
MAGNESIUM: 2.4 mg/dL (ref 1.7–2.4)
Magnesium: 1.9 mg/dL (ref 1.7–2.4)

## 2015-09-03 LAB — MRSA PCR SCREENING: MRSA by PCR: NEGATIVE

## 2015-09-03 LAB — CK TOTAL AND CKMB (NOT AT ARMC)
CK, MB: 11.5 ng/mL — AB (ref 0.5–5.0)
RELATIVE INDEX: 8.3 — AB (ref 0.0–2.5)
Total CK: 138 U/L (ref 49–397)

## 2015-09-03 LAB — APTT: aPTT: 26 seconds (ref 24–37)

## 2015-09-03 LAB — BETA-HYDROXYBUTYRIC ACID: Beta-Hydroxybutyric Acid: 0.34 mmol/L — ABNORMAL HIGH (ref 0.05–0.27)

## 2015-09-03 MED ORDER — POTASSIUM PHOSPHATES 15 MMOLE/5ML IV SOLN
30.0000 mmol | Freq: Once | INTRAVENOUS | Status: AC
  Administered 2015-09-03: 30 mmol via INTRAVENOUS
  Filled 2015-09-03: qty 10

## 2015-09-03 MED ORDER — SODIUM CHLORIDE 0.9 % IV BOLUS (SEPSIS)
500.0000 mL | Freq: Once | INTRAVENOUS | Status: AC
  Administered 2015-09-03: 500 mL via INTRAVENOUS

## 2015-09-03 MED ORDER — POTASSIUM CHLORIDE 10 MEQ/100ML IV SOLN
10.0000 meq | INTRAVENOUS | Status: AC
  Administered 2015-09-03 (×2): 10 meq via INTRAVENOUS
  Filled 2015-09-03 (×2): qty 100

## 2015-09-03 MED ORDER — POTASSIUM CHLORIDE 10 MEQ/100ML IV SOLN
10.0000 meq | INTRAVENOUS | Status: AC
Start: 2015-09-03 — End: 2015-09-04
  Administered 2015-09-03 (×2): 10 meq via INTRAVENOUS
  Filled 2015-09-03 (×2): qty 100

## 2015-09-03 MED ORDER — SODIUM CHLORIDE 0.9 % IV BOLUS (SEPSIS)
1000.0000 mL | Freq: Once | INTRAVENOUS | Status: AC
  Administered 2015-09-03: 1000 mL via INTRAVENOUS

## 2015-09-03 NOTE — Progress Notes (Signed)
Utilization review completed.  

## 2015-09-03 NOTE — Progress Notes (Signed)
eLink Physician-Brief Progress Note Patient Name: Russell Price DOB: 1960/12/09 MRN: [id number]   Date of Service  09/03/2015  HPI/Events of Note  Hypokalemia and hypophosphatemia  eICU Interventions  Potassium and Phos replaced     Intervention Category Intermediate Interventions: Electrolyte abnormality - evaluation and management  DETERDING,ELIZABETH 09/03/2015, 1:44 AM

## 2015-09-03 NOTE — Progress Notes (Signed)
eLink Physician-Brief Progress Note Patient Name: Russell Price DOB: 1960-11-20 MRN: [id number]   Date of Service  09/03/2015  HPI/Events of Note  Hypotension in the setting of DKA.  Current BP of 75 systolic.    eICU Interventions  Plan: 1 liter NS for BP support     Intervention Category Intermediate Interventions: Hypotension - evaluation and management  Fatimata Talsma 09/03/2015, 1:56 AM

## 2015-09-03 NOTE — Progress Notes (Addendum)
Subjective: No seizures  Exam: Filed Vitals:   09/03/15 0945 09/03/15 1000  BP: 154/83   Pulse: 74 68  Temp:    Resp: 26 26   Gen: In bed, intubated Resp: ventilated Abd: soft, nt  Neuro: MS: does not open eyes, but  [id number] eye reactive, right eye post-surgical. No blink to threat.  Motor: withdraws in all 4 extremities to noxious stimuli Sensory:as above.   Pertinent Labs: BG 177  Impression: 55 yo M with DKA and coma. He continues to be encephalopathic, but I continue to suspect that this is all related to his BG(1142 on admission). It is unclear if episode en route was seizure vs posturing.   Recommendations: 1) continue supportive care.  2) routine EEG  Roland Rack, MD [facility] [phone]  If 7pm- 7am, please page neurology on call as listed in [place].

## 2015-09-03 NOTE — Progress Notes (Signed)
PULMONARY / CRITICAL CARE MEDICINE   Name: Russell Price MRN: [id number] DOB: [birth year]    ADMISSION DATE:  09/02/2015  CHIEF COMPLAINT:  Seizure  HISTORY OF PRESENT ILLNESS:   55 yo male with h/o CKD-4, HLD, uncontrolled DM, right eye blindness who presents to the hospital BIBA after found unresponsive by wife.  The patient is sedated and the HPI is provided by his wife, who states that over the last few days he had been feeling weak and fatigued.  Today he felt the same but was able to function normally, she saw him normal about 1-130pm then she went out for about 30 min or so, upon her return he was unresponsive.  She did not notice and shaking at that time but did notice 2 lacerations on his head.    Per EMS report, en route he was noted to have some extensor posturing and there was suspicion for seizure activity, upon arrival he was minimally responsive and was intubated in the ED for airway protection.     PAST MEDICAL HISTORY :   has a past medical history of Diabetes mellitus without complication ([place]); Hypertension; and Chronic kidney disease.  has past surgical history that includes Bronchoscopy (02/14/2015). Prior to Admission medications   Medication Sig Start Date End Date Taking? Authorizing Provider  amLODipine (NORVASC) 10 MG tablet Take 1 tablet (10 mg total) by mouth daily. Stop lisinopril . 07/20/15   Posey Boyer, MD  aspirin EC 325 MG tablet Take 1 tablet (325 mg total) by mouth daily. 02/22/15   Mercy Riding, MD  atorvastatin (LIPITOR) 40 MG tablet Take 1 tablet (40 mg total) by mouth daily at 6 PM. 07/20/15   Posey Boyer, MD  carvedilol (COREG) 6.25 MG tablet Take 1 tablet (6.25 mg total) by mouth 2 (two) times daily with a meal. 07/20/15   Posey Boyer, MD  glipiZIDE (GLUCOTROL) 5 MG tablet Take 2 pills in the morning and one in the evening for diabetes 07/20/15   Posey Boyer, MD  Insulin Glargine (LANTUS SOLOSTAR) 100 UNIT/ML Solostar Pen Inject 60 units  subcutaneously at bedtime. 07/20/15   Posey Boyer, MD   No Known Allergies  SUBJECTIVE: sedated on vent  VITAL SIGNS: Temp:  [97.9 F (36.6 C)-100.6 F (38.1 C)] 98.9 F (37.2 C) (04/16 0800) Pulse Rate:  [65-123] 69 (04/16 1100) Resp:  [16-26] 26 (04/16 1100) BP: (72-210)/(42-106) 159/89 mmHg (04/16 1045) SpO2:  [96 %-100 %] 100 % (04/16 1100) FiO2 (%):  [40 %-100 %] 40 % (04/16 0844) Weight:  [69.6 kg (153 lb 7 oz)-75.751 kg (167 lb)] 69.6 kg (153 lb 7 oz) (04/15 2200) HEMODYNAMICS:   VENTILATOR SETTINGS: Vent Mode:  [-] PRVC FiO2 (%):  [40 %-100 %] 40 % Set Rate:  [16 bmp-26 bmp] 26 bmp Vt Set:  [560 mL] 560 mL PEEP:  [5 cmH20] 5 cmH20 Plateau Pressure:  [15 cmH20-17 cmH20] 17 cmH20 INTAKE / OUTPUT:  Intake/Output Summary (Last 24 hours) at 09/03/15 1133 Last data filed at 09/03/15 1100  Gross per 24 hour  Intake 3511.4 ml  Output   1375 ml  Net 2136.4 ml    PHYSICAL EXAMINATION: General:  Sedated Neuro:  Sedated on propofol HEENT:  Left eye reactive to light, abrasions on face Cardiovascular:  RRR, no m/r/g Lungs:  Mechanical breath sounds b/l, no w/r/r Abdomen:  Soft, non-tender, non distended, normal bowel sounds Musculoskeletal:  Normal bulk and tone Skin:  Lacerations as above.  No c/c/e  LABS:  CBC  Recent Labs Lab 09/02/15 2053 09/03/15 0036 09/03/15 0721  WBC 23.8* 15.0* 12.0*  HGB 13.1 9.8* 8.8*  HCT 34.5* 25.9* 23.9*  PLT 195 174 156   Coag's  Recent Labs Lab 09/03/15 0721  APTT 26  INR 1.29   BMET  Recent Labs Lab 09/02/15 2246 09/03/15 0036 09/03/15 0721  NA 128* 133* 136  K 3.7 3.4* 3.3*  CL 100* 104 109  CO2 13* 17* 15*  BUN 72* 70* 66*  CREATININE 4.80* 4.92* 4.46*  GLUCOSE 606* 496* 177*   Electrolytes  Recent Labs Lab 09/02/15 2246 09/03/15 0036 09/03/15 0721  CALCIUM 8.0* 8.5* 8.2*  MG 2.4 2.3 2.0  PHOS 2.0* 1.3* 2.9   Sepsis Markers  Recent Labs Lab 09/02/15 2046 09/03/15 0036 09/03/15 0721   LATICACIDVEN 4.6* 3.3* 2.3*   ABG  Recent Labs Lab 09/02/15 1831 09/02/15 2123  PHART 7.080* 7.247*  PCO2ART 53.0* 38.0  PO2ART 95.0 150*   Liver Enzymes  Recent Labs Lab 09/02/15 1630 09/03/15 0036  AST 24 19  ALT 12* 12*  ALKPHOS 125 90  BILITOT 0.5 0.2*  ALBUMIN 3.5 2.9*   Cardiac Enzymes  Recent Labs Lab 09/02/15 2053 09/03/15 0721  TROPONINI 0.13* 1.36*  1.22*   Glucose  Recent Labs Lab 09/03/15 0217 09/03/15 0322 09/03/15 0428 09/03/15 0535 09/03/15 0623 09/03/15 0743  GLUCAP 323* 242* 195* 182* 179* 161*    Imaging Ct Head Wo Contrast  09/02/2015  CLINICAL DATA:  Patient found by family on the floor. Recent seizure. EXAM: CT HEAD WITHOUT CONTRAST CT CERVICAL SPINE WITHOUT CONTRAST TECHNIQUE: Multidetector CT imaging of the head and cervical spine was performed following the standard protocol without intravenous contrast. Multiplanar CT image reconstructions of the cervical spine were also generated. COMPARISON:  MRI brain 02/18/2015 FINDINGS: CT HEAD FINDINGS Ventricles and sulci are appropriate for patient's age. No evidence for acute cortically based infarct, intracranial hemorrhage, mass lesion or mass-effect. Orbits are unremarkable. Small amount of fluid within the left maxillary sinus. Remainder of the paranasal sinuses are unremarkable. Mastoid air cells are well aerated. Calvarium is intact. CT CERVICAL SPINE FINDINGS Normal anatomic alignment. No evidence for acute fracture or dislocation. Preservation of the vertebral body and intervertebral disc space heights. Craniocervical junction is intact. Lung apices are unremarkable. ET tube terminates in the distal trachea visualized thyroid is unremarkable. IMPRESSION: No acute intracranial process. No acute cervical spine fracture. Electronically Signed   By: Lovey Newcomer M.D.   On: 09/02/2015 17:24   Ct Cervical Spine Wo Contrast  09/02/2015  CLINICAL DATA:  Patient found by family on the floor. Recent  seizure. EXAM: CT HEAD WITHOUT CONTRAST CT CERVICAL SPINE WITHOUT CONTRAST TECHNIQUE: Multidetector CT imaging of the head and cervical spine was performed following the standard protocol without intravenous contrast. Multiplanar CT image reconstructions of the cervical spine were also generated. COMPARISON:  MRI brain 02/18/2015 FINDINGS: CT HEAD FINDINGS Ventricles and sulci are appropriate for patient's age. No evidence for acute cortically based infarct, intracranial hemorrhage, mass lesion or mass-effect. Orbits are unremarkable. Small amount of fluid within the left maxillary sinus. Remainder of the paranasal sinuses are unremarkable. Mastoid air cells are well aerated. Calvarium is intact. CT CERVICAL SPINE FINDINGS Normal anatomic alignment. No evidence for acute fracture or dislocation. Preservation of the vertebral body and intervertebral disc space heights. Craniocervical junction is intact. Lung apices are unremarkable. ET tube terminates in the distal trachea visualized thyroid is unremarkable. IMPRESSION:  No acute intracranial process. No acute cervical spine fracture. Electronically Signed   By: Lovey Newcomer M.D.   On: 09/02/2015 17:24   Mr Brain Wo Contrast  09/02/2015  CLINICAL DATA:  Code stroke. Unresponsive. Evaluate for basilar thrombosis. EXAM: MRA HEAD WITHOUT CONTRAST TECHNIQUE: Angiographic images of the Circle of Willis were obtained using MRA technique without intravenous contrast. COMPARISON:  Head CT from earlier today.  MRI 02/18/2015 FINDINGS: Axial diffusion was performed and is negative for acute infarct. Symmetric carotid arteries at the upper cervical segment. Extensive atherosclerotic irregularity of the bilateral siphons consistent with atheromatous disease. There is a new flow gap at the anterior genu of the right ICA with susceptibility artifact that does not correlate with metal, stent, or gas on prior head CT. There is also poor signal at the left carotid terminus extending  into the A1 segment. Beyond these apparent stenoses, there is robust and symmetric flow related signal. Dominant right vertebral artery. Extensive atherosclerotic irregularity of the left V4 segment, likely occluded distally on a chronic basis .There is atheromatous narrowing of the left proximal PICA. Atheromatous narrowing of the right P1 P2 segment that is mild. No major vessel occlusion in the vertebrobasilar distribution. IMPRESSION: 1. Negative for basilar thrombosis.  No acute infarct. 2. Apparent high-grade stenosis at the left carotid terminus and right cavernous carotid are suspicious for artifact given rapid development since 02/18/2015 study and associated susceptibility artifact. Consider follow-up or CTA depending on clinical circumstances. 3. 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 Electronically Signed   By: Monte Fantasia M.D.   On: 09/02/2015 18:35   Dg Chest Port 1 View  09/02/2015  CLINICAL DATA:  Patient status post ET tube placement. EXAM: PORTABLE CHEST 1 VIEW COMPARISON:  Chest radiograph 02/20/2015 FINDINGS: ET tube terminates in the distal trachea. Monitoring leads overlie the patient. Stable enlarged cardiac and mediastinal contours. Elevation right hemidiaphragm. Heterogeneous opacities right lung base. No pleural effusion or pneumothorax. IMPRESSION: ET tube terminates in the distal trachea. Elevation right hemidiaphragm. Heterogeneous opacities right lung base may represent atelectasis, aspiration or infection. Electronically Signed   By: Lovey Newcomer M.D.   On: 09/02/2015 17:33   Dg Abd Portable 1v  09/02/2015  CLINICAL DATA:  Enteric tube placed EXAM: PORTABLE ABDOMEN - 1 VIEW COMPARISON:  02/14/2015 abdominal radiograph FINDINGS: Enteric tube terminates in the distal stomach. Mild gaseous distention of the stomach. No dilated small bowel loops in the visualized upper abdomen. No evidence of pneumatosis or  pneumoperitoneum. IMPRESSION: Enteric tube terminates in the distal stomach. Electronically Signed   By: Ilona Sorrel M.D.   On: 09/02/2015 22:17   Micro: 4/15 BCx>> 4/15 UCx>>  Lines/Tubes: ETT 7.76mm 4/15>> OG 4/15 >> Foley 4/15>>  Abx: Ceftriaxone 4/15 x1  Studies:  MR Brain 4/15: No acute infarct, high gtrade stenosis at left carotid terminus and right cavernous carotid. CT head 4/15: No acute process pCXR 4/15: Elevated right hemidiaphragm, heterogenous opacities right lung   ASSESSMENT / PLAN: 55 yo male admitted with acute encephalopathy and possible seizure 2/2 DKA, unclear precipitant but it appears the patient's DM has been poorly controlled for some time.   PULMONARY A: - Intubated for airway protection - Monitor for PNA/ Aspiration  P:   - Oral Care - PPI - VAP prevention management bundle - daily SBT -pCXR tomorrow   CARDIOVASCULAR  A:  - HTN - Hypovolemia - Troponin leak>> likely demand P:  -  carvedilol 6.25 bid - amlodipine 10mg  Qday - continue to trend trops>> wound not start heparin at this time, not a good cath candidate with his renal dysfunction, may need to consider stress testing prior to discharge   RENAL A:   - CKD 4 (baseline Scr between 3 and 5) - Uremia - chronic - hyperosmolar hyponatremia (corrected Sodium on admission was 137) - AGMA - 2/2 lactate and  BUN - NAGMA - 2/2 CKD - Lactic acidemia P:   - currently at baseline Scr - UOP currently adequate - avoid nephrotoxins - Maintain UOP >0.5cc/kg/hr - Slow glucose correction - trend lactate - s/p volume resucitation as above - continue LR @125cc /hr  GASTROINTESTINAL A:   - no active issues at present time P:   - pepcid  HEMATOLOGIC A:   Normocytic anemia  P:  - trend CBC  INFECTIOUS A:   Leukocytosis P:   BCx2 09/02/2015 UC 09/02/2015 Sputum 09/02/2015 Abx: none,   ENDOCRINE A:   - Severe DKA - life threatening - DM - HLD   P:   - DKA protocol - LR  fluid resuscitation - insulin gtt>>continue - POC BG checks - potassium/phos monitoring - electrolyte repletion protocol  NEUROLOGIC A:   - Acute encephalopathy - Possible provoked seizure- hyperglycemia P:   RASS goal: 0 - CT head and neck unremarkable - MRI with some possible artifact - propofol gtt - neurology consulted - appreciate recs     FAMILY  - Updates: spoke with wife and daughter bedside and gave updates today.   Lucious Groves, DO IMTS PGY-3 Pulmonary and [place] Pager: [phone]  09/03/2015, 11:33 AM

## 2015-09-03 NOTE — Progress Notes (Signed)
eLink Physician-Brief Progress Note Patient Name: Russell Price DOB: Oct 01, 1960 MRN: [id number]   Date of Service  09/03/2015  HPI/Events of Note  K+ = 3.0 and Creatinine = 4.51.  eICU Interventions  Will cautiously replete K+.     Intervention Category Intermediate Interventions: Electrolyte abnormality - evaluation and management  Sommer,Steven Eugene 09/03/2015, 6:21 PM

## 2015-09-03 NOTE — Progress Notes (Signed)
CRITICAL VALUE ALERT  Critical value received:  Toponin 1.22  Date of notification:  09/03/15  Time of notification:  [id number]  Critical value read back:Yes.    Nurse who received alert:  Allegra Grana, RN  MD notified (1st page):  Dr Heber [place]  Time of first page:  [phone]  MD notified (2nd page):  Time of second page:  Responding MD:  Dr Heber [place]  Time MD responded:  [phone]

## 2015-09-04 ENCOUNTER — Inpatient Hospital Stay (HOSPITAL_COMMUNITY): Payer: Medicaid Other

## 2015-09-04 DIAGNOSIS — R0600 Dyspnea, unspecified: Secondary | ICD-10-CM

## 2015-09-04 DIAGNOSIS — E0810 Diabetes mellitus due to underlying condition with ketoacidosis without coma: Secondary | ICD-10-CM

## 2015-09-04 DIAGNOSIS — E872 Acidosis, unspecified: Secondary | ICD-10-CM | POA: Insufficient documentation

## 2015-09-04 LAB — BASIC METABOLIC PANEL
ANION GAP: 10 (ref 5–15)
ANION GAP: 12 (ref 5–15)
Anion gap: 12 (ref 5–15)
Anion gap: 9 (ref 5–15)
BUN: 52 mg/dL — ABNORMAL HIGH (ref 6–20)
BUN: 48 mg/dL — AB (ref 6–20)
BUN: 57 mg/dL — AB (ref 6–20)
BUN: 43 mg/dL — ABNORMAL HIGH (ref 6–20)
CALCIUM: 8.5 mg/dL — AB (ref 8.9–10.3)
CHLORIDE: 109 mmol/L (ref 101–111)
CHLORIDE: 114 mmol/L — AB (ref 101–111)
CO2: 14 mmol/L — AB (ref 22–32)
CO2: 16 mmol/L — ABNORMAL LOW (ref 22–32)
CO2: 15 mmol/L — AB (ref 22–32)
CO2: 19 mmol/L — ABNORMAL LOW (ref 22–32)
Calcium: 8.7 mg/dL — ABNORMAL LOW (ref 8.9–10.3)
Calcium: 8.5 mg/dL — ABNORMAL LOW (ref 8.9–10.3)
Calcium: 8.4 mg/dL — ABNORMAL LOW (ref 8.9–10.3)
Chloride: 111 mmol/L (ref 101–111)
Chloride: 114 mmol/L — ABNORMAL HIGH (ref 101–111)
Creatinine, Ser: 4.12 mg/dL — ABNORMAL HIGH (ref 0.61–1.24)
Creatinine, Ser: 4.06 mg/dL — ABNORMAL HIGH (ref 0.61–1.24)
Creatinine, Ser: 4.14 mg/dL — ABNORMAL HIGH (ref 0.61–1.24)
Creatinine, Ser: 3.95 mg/dL — ABNORMAL HIGH (ref 0.61–1.24)
GFR calc Af Amer: 17 mL/min — ABNORMAL LOW (ref >60)
GFR calc Af Amer: 17 mL/min — ABNORMAL LOW (ref >60)
GFR calc Af Amer: 18 mL/min — ABNORMAL LOW (ref >60)
GFR calc non Af Amer: 15 mL/min — ABNORMAL LOW (ref >60)
GFR calc non Af Amer: 15 mL/min — ABNORMAL LOW (ref >60)
GFR, EST AFRICAN AMERICAN: 17 mL/min — AB (ref >60)
GFR, EST NON AFRICAN AMERICAN: 15 mL/min — AB (ref >60)
GFR, EST NON AFRICAN AMERICAN: 16 mL/min — AB (ref >60)
GLUCOSE: 127 mg/dL — AB (ref 65–99)
GLUCOSE: 125 mg/dL — AB (ref 65–99)
Glucose, Bld: 156 mg/dL — ABNORMAL HIGH (ref 65–99)
Glucose, Bld: 241 mg/dL — ABNORMAL HIGH (ref 65–99)
POTASSIUM: 3.2 mmol/L — AB (ref 3.5–5.1)
POTASSIUM: 4.5 mmol/L (ref 3.5–5.1)
POTASSIUM: 3.5 mmol/L (ref 3.5–5.1)
POTASSIUM: 3.4 mmol/L — AB (ref 3.5–5.1)
SODIUM: 137 mmol/L (ref 135–145)
Sodium: 138 mmol/L (ref 135–145)
Sodium: 138 mmol/L (ref 135–145)
Sodium: 142 mmol/L (ref 135–145)

## 2015-09-04 LAB — URINE CULTURE
Culture: NO GROWTH
SPECIAL REQUESTS: NORMAL

## 2015-09-04 LAB — MAGNESIUM
MAGNESIUM: 1.9 mg/dL (ref 1.7–2.4)
Magnesium: 1.9 mg/dL (ref 1.7–2.4)

## 2015-09-04 LAB — GLUCOSE, CAPILLARY
GLUCOSE-CAPILLARY: 227 mg/dL — AB (ref 65–99)
GLUCOSE-CAPILLARY: 212 mg/dL — AB (ref 65–99)
GLUCOSE-CAPILLARY: 179 mg/dL — AB (ref 65–99)
GLUCOSE-CAPILLARY: 149 mg/dL — AB (ref 65–99)
GLUCOSE-CAPILLARY: 154 mg/dL — AB (ref 65–99)
GLUCOSE-CAPILLARY: 173 mg/dL — AB (ref 65–99)
GLUCOSE-CAPILLARY: 196 mg/dL — AB (ref 65–99)
GLUCOSE-CAPILLARY: 201 mg/dL — AB (ref 65–99)
GLUCOSE-CAPILLARY: 229 mg/dL — AB (ref 65–99)
GLUCOSE-CAPILLARY: 258 mg/dL — AB (ref 65–99)
GLUCOSE-CAPILLARY: 240 mg/dL — AB (ref 65–99)
GLUCOSE-CAPILLARY: 182 mg/dL — AB (ref 65–99)
Glucose-Capillary: 121 mg/dL — ABNORMAL HIGH (ref 65–99)
Glucose-Capillary: 209 mg/dL — ABNORMAL HIGH (ref 65–99)
Glucose-Capillary: 109 mg/dL — ABNORMAL HIGH (ref 65–99)
Glucose-Capillary: 114 mg/dL — ABNORMAL HIGH (ref 65–99)
Glucose-Capillary: 128 mg/dL — ABNORMAL HIGH (ref 65–99)
Glucose-Capillary: 150 mg/dL — ABNORMAL HIGH (ref 65–99)
Glucose-Capillary: 161 mg/dL — ABNORMAL HIGH (ref 65–99)
Glucose-Capillary: 198 mg/dL — ABNORMAL HIGH (ref 65–99)
Glucose-Capillary: 267 mg/dL — ABNORMAL HIGH (ref 65–99)
Glucose-Capillary: 199 mg/dL — ABNORMAL HIGH (ref 65–99)
Glucose-Capillary: 232 mg/dL — ABNORMAL HIGH (ref 65–99)

## 2015-09-04 LAB — CBC WITH DIFFERENTIAL/PLATELET
Basophils Absolute: 0 10*3/uL (ref 0.0–0.1)
Basophils Relative: 0 %
EOS ABS: 0.1 10*3/uL (ref 0.0–0.7)
Eosinophils Relative: 1 %
HEMATOCRIT: 27.3 % — AB (ref 39.0–52.0)
HEMOGLOBIN: 9.6 g/dL — AB (ref 13.0–17.0)
LYMPHS ABS: 1.3 10*3/uL (ref 0.7–4.0)
LYMPHS PCT: 14 %
MCH: 26.7 pg (ref 26.0–34.0)
MCHC: 35.2 g/dL (ref 30.0–36.0)
MCV: 75.8 fL — AB (ref 78.0–100.0)
Monocytes Absolute: 0.6 10*3/uL (ref 0.1–1.0)
Monocytes Relative: 6 %
NEUTROS ABS: 7.7 10*3/uL (ref 1.7–7.7)
NEUTROS PCT: 79 %
Platelets: 146 10*3/uL — ABNORMAL LOW (ref 150–400)
RBC: 3.6 MIL/uL — AB (ref 4.22–5.81)
RDW: 13.2 % (ref 11.5–15.5)
WBC: 9.7 10*3/uL (ref 4.0–10.5)

## 2015-09-04 LAB — POCT I-STAT 3, ART BLOOD GAS (G3+)
ACID-BASE DEFICIT: 7 mmol/L — AB (ref 0.0–2.0)
BICARBONATE: 16.9 meq/L — AB (ref 20.0–24.0)
O2 Saturation: 99 %
PCO2 ART: 29.7 mmHg — AB (ref 35.0–45.0)
PH ART: 7.364 (ref 7.350–7.450)
PO2 ART: 127 mmHg — AB (ref 80.0–100.0)
Patient temperature: 99.4
TCO2: 18 mmol/L (ref 0–100)

## 2015-09-04 LAB — PHOSPHORUS
PHOSPHORUS: 3.4 mg/dL (ref 2.5–4.6)
PHOSPHORUS: 3.2 mg/dL (ref 2.5–4.6)
Phosphorus: 3 mg/dL (ref 2.5–4.6)
Phosphorus: 3.6 mg/dL (ref 2.5–4.6)
Phosphorus: 3.5 mg/dL (ref 2.5–4.6)
Phosphorus: 3 mg/dL (ref 2.5–4.6)

## 2015-09-04 LAB — ECHOCARDIOGRAM COMPLETE
HEIGHTINCHES: 70 in
WEIGHTICAEL: 2694.9 [oz_av]

## 2015-09-04 MED ORDER — ASPIRIN 81 MG PO CHEW
81.0000 mg | CHEWABLE_TABLET | Freq: Every day | ORAL | Status: DC
  Administered 2015-09-04 – 2015-09-08 (×3): 81 mg via ORAL
  Filled 2015-09-04 (×4): qty 1

## 2015-09-04 MED ORDER — HYDRALAZINE HCL 20 MG/ML IJ SOLN
10.0000 mg | INTRAMUSCULAR | Status: DC | PRN
  Administered 2015-09-04 – 2015-09-07 (×5): 10 mg via INTRAVENOUS
  Filled 2015-09-04 (×5): qty 1

## 2015-09-04 MED ORDER — SODIUM BICARBONATE 8.4 % IV SOLN
INTRAVENOUS | Status: DC
  Administered 2015-09-04 – 2015-09-05 (×2): via INTRAVENOUS
  Filled 2015-09-04 (×2): qty 150

## 2015-09-04 MED ORDER — PRO-STAT SUGAR FREE PO LIQD
30.0000 mL | Freq: Two times a day (BID) | ORAL | Status: AC
  Administered 2015-09-04 (×2): 30 mL
  Filled 2015-09-04 (×2): qty 30

## 2015-09-04 MED ORDER — SODIUM CHLORIDE 0.9 % IV SOLN
INTRAVENOUS | Status: DC
  Administered 2015-09-04: 9.7 [IU]/h via INTRAVENOUS
  Filled 2015-09-04: qty 2.5

## 2015-09-04 MED ORDER — VITAL HIGH PROTEIN PO LIQD
1000.0000 mL | ORAL | Status: DC
  Administered 2015-09-04: 11:00:00

## 2015-09-04 MED ORDER — VITAL AF 1.2 CAL PO LIQD
1000.0000 mL | ORAL | Status: DC
  Administered 2015-09-04: 1000 mL
  Filled 2015-09-04 (×5): qty 1000

## 2015-09-04 MED ORDER — POTASSIUM CHLORIDE 20 MEQ PO PACK
40.0000 meq | PACK | Freq: Once | ORAL | Status: AC
  Administered 2015-09-04: 40 meq
  Filled 2015-09-04: qty 2

## 2015-09-04 NOTE — Progress Notes (Signed)
Initial Nutrition Assessment  DOCUMENTATION CODES:   Not applicable  INTERVENTION:    Change TF via OGT to Vital AF 1.2 at 25 ml/h and Prostat 30 ml BID on day 1; on day 2, increase to goal rate of 65 ml/h (1560 ml per day) to provide 1872 kcals, 117 gm protein, 1265 ml free water daily.  Total energy intake with TF and Propofol will be 2052 kcals per day (100% of estimated needs).  NUTRITION DIAGNOSIS:   Inadequate oral intake related to inability to eat as evidenced by NPO status.  GOAL:   Patient will meet greater than or equal to 90% of their needs  MONITOR:   Vent status, TF tolerance, Labs, Weight trends, I & O's  REASON FOR ASSESSMENT:   Consult Enteral/tube feeding initiation and management  ASSESSMENT:   55 yo male with h/o CKD-4, HLD, uncontrolled DM, right eye blindness who presents to the hospital BIBA after found unresponsive by wife.  Labs reviewed: potassium low.  Patient is currently intubated on ventilator support MV: 15.9 L/min Temp (24hrs), Avg:98.7 F (37.1 C), Min:97.6 F (36.4 C), Max:99.4 F (37.4 C)  Propofol: 6.8 ml/hr providing 180 kcals per day.   Diet Order:   NPO  Skin:  Reviewed, no issues  Last BM:  UTA  Height:   Ht Readings from Last 1 Encounters:  09/02/15 5\' 10"  (1.778 m)    Weight:   Wt Readings from Last 1 Encounters:  09/04/15 168 lb 6.9 oz (76.4 kg)    Ideal Body Weight:  75.5 kg  BMI:  Body mass index is 24.17 kg/(m^2).  Estimated Nutritional Needs:   Kcal:  2062  Protein:  105-115 gm  Fluid:  2.1 L  EDUCATION NEEDS:   No education needs identified at this time  Molli Barrows, [place], [place], [place] Pager [phone] After Hours Pager [phone]

## 2015-09-04 NOTE — Progress Notes (Signed)
PULMONARY / CRITICAL CARE MEDICINE   Name: Russell Price MRN: [id number] DOB: 28-Jan-1961    ADMISSION DATE:  09/02/2015  CHIEF COMPLAINT:  Seizure  HISTORY OF PRESENT ILLNESS:   55 yo male with h/o CKD-4, HLD, uncontrolled DM, right eye blindness who presents to the hospital BIBA after found unresponsive by wife.  The patient is sedated and the HPI is provided by his wife, who states that over the last few days he had been feeling weak and fatigued.  Today he felt the same but was able to function normally, she saw him normal about 1-130pm then she went out for about 30 min or so, upon her return he was unresponsive.  She did not notice and shaking at that time but did notice 2 lacerations on his head.    Per EMS report, en route he was noted to have some extensor posturing and there was suspicion for seizure activity, upon arrival he was minimally responsive and was intubated in the ED for airway protection.   SUBJECTIVE: sedated on vent  VITAL SIGNS: Temp:  [97.6 F (36.4 C)-99 F (37.2 C)] 98.7 F (37.1 C) (04/17 0000) Pulse Rate:  [59-76] 65 (04/17 0615) Resp:  [21-29] 26 (04/17 0615) BP: (92-181)/(54-132) 155/79 mmHg (04/17 0615) SpO2:  [100 %] 100 % (04/17 0615) FiO2 (%):  [40 %] 40 % (04/17 0401) Weight:  [76.4 kg (168 lb 6.9 oz)] 76.4 kg (168 lb 6.9 oz) (04/17 0200) HEMODYNAMICS:   VENTILATOR SETTINGS: Vent Mode:  [-] PRVC FiO2 (%):  [40 %] 40 % Set Rate:  [26 bmp] 26 bmp Vt Set:  [560 mL] 560 mL PEEP:  [5 cmH20] 5 cmH20 Plateau Pressure:  [17 cmH20-18 cmH20] 18 cmH20 INTAKE / OUTPUT:  Intake/Output Summary (Last 24 hours) at 09/04/15 0748 Last data filed at 09/04/15 0600  Gross per 24 hour  Intake 3503.07 ml  Output   2325 ml  Net 1178.07 ml    PHYSICAL EXAMINATION: General:  Sedated, does not wake to voice Neuro:  Sedated on propofol HEENT:  abrasions on face Cardiovascular:  RRR, no m/r/g Lungs:  Mechanical breath sounds b/l, no w/r/r Abdomen:  Soft,  non-tender, non distended, normal bowel sounds Musculoskeletal:  Normal bulk and tone Skin:  Lacerations as above.  No c/c/e  LABS:  CBC  Recent Labs Lab 09/02/15 2053 09/03/15 0036 09/03/15 0721  WBC 23.8* 15.0* 12.0*  HGB 13.1 9.8* 8.8*  HCT 34.5* 25.9* 23.9*  PLT 195 174 156   Coag's  Recent Labs Lab 09/03/15 0721  APTT 26  INR 1.29   BMET  Recent Labs Lab 09/03/15 2014 09/04/15 0030 09/04/15 0511  NA 136 138 138  K 3.2* 4.5 3.4*  CL 108 114* 111  CO2 16* 14* 15*  BUN 58* 57* 52*  CREATININE 4.29* 4.12* 4.06*  GLUCOSE 220* 127* 156*   Electrolytes  Recent Labs Lab 09/03/15 0036 09/03/15 0721 09/03/15 1242  09/03/15 2014 09/04/15 0030 09/04/15 0511  CALCIUM 8.5* 8.2* 8.3*  < > 8.4* 8.7* 8.5*  MG 2.3 2.0 1.9  --   --   --   --   PHOS 1.3* 2.9 3.2  < > 3.0 3.0 3.6  < > = values in this interval not displayed. Sepsis Markers  Recent Labs Lab 09/03/15 0036 09/03/15 0721 09/03/15 1242  LATICACIDVEN 3.3* 2.3* 1.5   ABG  Recent Labs Lab 09/02/15 1831 09/02/15 2123  PHART 7.080* 7.247*  PCO2ART 53.0* 38.0  PO2ART 95.0 150*  Liver Enzymes  Recent Labs Lab 09/02/15 1630 09/03/15 0036  AST 24 19  ALT 12* 12*  ALKPHOS 125 90  BILITOT 0.5 0.2*  ALBUMIN 3.5 2.9*   Cardiac Enzymes  Recent Labs Lab 09/02/15 2053 09/03/15 0721 09/03/15 1242  TROPONINI 0.13* 1.36*  1.22* 0.95*   Glucose  Recent Labs Lab 09/04/15 0027 09/04/15 0130 09/04/15 0228 09/04/15 0327 09/04/15 0425 09/04/15 0525  GLUCAP 121* 114* 109* 128* 161* 150*    Imaging Dg Chest Port 1 View  09/04/2015  CLINICAL DATA:  Acute respiratory failure, intubated patient, diabetic ketoacidosis, acute and chronic renal failure. EXAM: PORTABLE CHEST 1 VIEW COMPARISON:  Portable chest x-ray of September 02, 2015 FINDINGS: The lungs are well-expanded. There is no focal infiltrate. There is no pleural effusion or pneumothorax. The heart and pulmonary vascularity are normal.  The mediastinum is normal in width. The endotracheal tube tip lies approximately 6.3 cm above the carina. The esophagogastric tube tip projects below the inferior margin of the image. IMPRESSION: Interval improvement in the appearance of the right lung base with resolution of subsegmental atelectasis or infiltrate. There is no evidence of pneumonia nor pulmonary edema. No pleural effusions are evident. The support tubes are in stable position. Electronically Signed   By: David  [place]ique M.D.   On: 09/04/2015 07:23   Micro: 4/15 BCx>> 4/15 UCx>>  Lines/Tubes: ETT 7.55m 4/15>> OG 4/15 >> Foley 4/15>>  Abx: Ceftriaxone 4/15 x1  Studies:  MR Brain 4/15: No acute infarct, high gtrade stenosis at left carotid terminus and right cavernous carotid. CT head 4/15: No acute process pCXR 4/15: Elevated right hemidiaphragm, heterogenous opacities right lung   ASSESSMENT / PLAN: 55yo male admitted with acute encephalopathy and possible seizure 2/2 DKA, unclear precipitant but it appears the patient's DM has been poorly controlled for some time.   PULMONARY A: - Intubated for airway protection - Monitor for PNA/ Aspiration  P:   - Oral Care - PPI - VAP prevention management bundle - daily SBT planned after ABg below -advance ett -pCXR tomorrow -ABG noted last, r./o alk, get abg -see renal  CARDIOVASCULAR  A:  - HTN - Hypovolemia - Troponin leak>> likely demand peak 1.36 last 0.95 P:  - carvedilol 6.25 bid - amlodipine 15mQday - no heparin at this time, not a good cath candidate with his renal dysfunction, may need to consider stress testing prior to discharge -asa - echo assessment -add hydral  RENAL A:   - CKD 4 (baseline Scr between 3 and 5) - Uremia - chronic - hyperosmolar hyponatremia (corrected Sodium on admission was 137) - NAGMA - 2/2 CKD - Lactic acidemia- resolved -NONAG acidosis (salinje, rta 4?) -hypok P:   - currently at baseline Scr - UOP currently  adequate - avoid nephrotoxins - Maintain UOP >0.5cc/kg/hr -consider bicarb in setting crt 4 (gap closed overall) -K supp -avoid saline  GASTROINTESTINAL A:   - no active issues at present time P:   - pepcid -start feeds today as DKA resolved  HEMATOLOGIC A:   Normocytic anemia DVt prevention P:  - CBC -sub q hep  INFECTIOUS A:   Leukocytosis trending down P:   BCx2 09/02/2015 UC 09/02/2015 Sputum 09/02/2015 Abx: none  ENDOCRINE A:   - Severe DKA - life threatening, resolbved, gap closed - DM - HLD   P:   - DKA protocol off to hyperglycemia - LR fluid resuscitation - insulin gtt>>AG closed but Bicarb remain's low possibly due to renal dysfunction  -  Check Blood ketones - POC BG checks - potassium/phos monitoring - electrolyte repletion protocol  NEUROLOGIC A:   - Acute encephalopathy - Possible provoked seizure- hyperglycemia P:   RASS goal: -2 - propofol gtt with EEG - neurology consulted - appreciate recs   FAMILY  - Updates:    Lucious Groves, DO IMTS PGY-3 Pulmonary and [place] Pager: [phone]  09/04/2015, 7:48 AM    STAFF NOTE: Linwood Dibbles, MD FACP have personally reviewed patient's available data, including medical history, events of note, physical examination and test results as part of my evaluation. I have discussed with resident/NP and other care providers such as pharmacist, RN and RRT. In addition, I personally evaluated patient and elicited key findings of: rass deep on propofol, lungs clear, DKA resolved as gap closed, remains with NONAG from rta 4 >?  Vs saline , avoid saline, get abg, likley to add bicarb for NON AG process in setting crt 4, hope to reduce sedation and SBt today, await neuro assessment, NO ABX, no fevers, k supp, feeds start as now on to Critical illness protcol, not dka, i updated wife and family, advance 1 cm ett The patient is critically ill with multiple organ systems  failure and requires high complexity decision making for assessment and support, frequent evaluation and titration of therapies, application of advanced monitoring technologies and extensive interpretation of multiple databases.   Critical Care Time devoted to patient care services described in this note is 35 Minutes. This time reflects time of care of this signee: Merrie Roof, MD FACP. This critical care time does not reflect procedure time, or teaching time or supervisory time of PA/NP/Med student/Med Resident etc but could involve care discussion time. Rest per NP/medical resident whose note is outlined above and that I agree with   Lavon Paganini. Titus Mould, MD, [place] Pgr: [facility] 09/04/2015 9:33 AM

## 2015-09-04 NOTE — Progress Notes (Signed)
EEG completed, results pending. 

## 2015-09-04 NOTE — Procedures (Cosign Needed)
ELECTROENCEPHALOGRAM REPORT  Date of Study: 09/04/2015  Patient's Name: Russell Price MRN: [id number] Date of Birth: 1961-01-11  Referring Provider: Roland Rack, MD  Indication: 55 yo male with h/o CKD-4, HLD, uncontrolled DM, right eye blindness who was found unresponsive.  Possible seizure activity.  Patient is intubated and sedated.  Medications: 0.9 % sodium chloride infusion amLODipine (NORVASC) tablet 10 mg antiseptic oral rinse solution (CORINZ) aspirin chewable tablet 81 mg atorvastatin (LIPITOR) tablet 40 mg carvedilol (COREG) tablet 6.25 mg chlorhexidine gluconate (SAGE KIT) (PERIDEX) 0.12 % solution 15 mL famotidine (PEPCID) 40 MG/5ML suspension 20 mg feeding supplement (PRO-STAT SUGAR FREE 64) liquid 30 mL feeding supplement (VITAL HIGH PROTEIN) liquid 1,000 mL fentaNYL (SUBLIMAZE) injection 50 mcg heparin injection 5,000 Units hydrALAZINE (APRESOLINE) injection 10 mg insulin regular (NOVOLIN R,HUMULIN R) 250 Units in sodium chloride 0.9 % 250 mL (1 Units/mL) infusion potassium chloride (KLOR-CON) packet 40 mEq propofol (DIPRIVAN) 10 mg/mL bolus/IV push 20 mg propofol (DIPRIVAN) 1000 MG/100ML infusion   Technical Summary: This is a multichannel digital EEG recording, using the international 10-20 placement system with electrodes applied with paste and impedances below 5000 ohms.    Description: There is diffuse slowing of the EEG background, consisting of 2-3 Hz delta and 6-7 Hz theta activities without any discernible posterior dominant rhythm. No focal or generalized epileptiform discharges are seen.  Stage II sleep is not seen.  Hyperventilation and photic stimulation were not performed.  ECG revealed normal cardiac rate and rhythm.  Impression: This is an abnormal EEG due to generalized background slowing.  This is indicative of diffuse cerebral dysfunction.  This is a nonspecific finding which is likely secondary to sedation but may also be seen  in a toxic-metabolic, infectious, hypoxic or any other diffuse physiologic abnormality.  Adam R. Tomi Likens, DO

## 2015-09-04 NOTE — Progress Notes (Signed)
Patient placed on full support by RN after vomiting. Suctioned afterward, did not appear to have aspirated. RT will monitor as needed.

## 2015-09-04 NOTE — Progress Notes (Signed)
Interval History:                                                                                                                      Russell Price is an 55 y.o. male patient with acute encephalopathy secondary to hyperglycemia, intubated was taken off of sedation this morning.     Past Medical History: Past Medical History  Diagnosis Date  . Diabetes mellitus without complication ([place])     followed by Dr Chalmers Cater  . Hypertension     followed by [facility]  . Chronic kidney disease     Past Surgical History  Procedure Laterality Date  . Bronchoscopy  02/14/2015    for pulm hemorrhage    Family History: Family History  Problem Relation Age of Onset  . Diabetes Mother     Social History:   reports that he quit smoking about 32 years ago. He does not have any smokeless tobacco history on file. He reports that he does not drink alcohol or use illicit drugs.  Allergies:  No Known Allergies   Medications:                                                                                                                         Current facility-administered medications:  .  0.9 %  sodium chloride infusion, 250 mL, Intravenous, PRN, Roswell Nickel, MD .  amLODipine (NORVASC) tablet 10 mg, 10 mg, Oral, Daily, Roswell Nickel, MD, 10 mg at 09/04/15 1031 .  antiseptic oral rinse solution (CORINZ), 7 mL, Mouth Rinse, QID, Roswell Nickel, MD, 7 mL at 09/04/15 1644 .  aspirin chewable tablet 81 mg, 81 mg, Oral, Daily, Maryellen Pile, MD, 81 mg at 09/04/15 1114 .  atorvastatin (LIPITOR) tablet 40 mg, 40 mg, Oral, q1800, Roswell Nickel, MD, 40 mg at 09/04/15 1704 .  carvedilol (COREG) tablet 6.25 mg, 6.25 mg, Oral, BID WC, Roswell Nickel, MD, 6.25 mg at 09/04/15 1704 .  chlorhexidine gluconate (SAGE KIT) (PERIDEX) 0.12 % solution 15 mL, 15 mL, Mouth Rinse, BID, Roswell Nickel, MD, 15 mL at 09/04/15 0736 .  famotidine (PEPCID) 40 MG/5ML suspension 20 mg, 20 mg, Per Tube,  Daily, Jake Church Masters, RPH, 20 mg at 09/04/15 1031 .  feeding supplement (PRO-STAT SUGAR FREE 64) liquid 30 mL, 30 mL, Per Tube, BID, Raylene Miyamoto, MD, 30 mL at 09/04/15 1114 .  feeding supplement (VITAL AF 1.2 CAL) liquid 1,000  mL, 1,000 mL, Per Tube, Continuous, Raylene Miyamoto, MD, Last Rate: 25 mL/hr at 09/04/15 1531, 1,000 mL at 09/04/15 1531 .  fentaNYL (SUBLIMAZE) injection 50 mcg, 50 mcg, Intravenous, Q1H PRN, Duffy Bruce, MD, 50 mcg at 09/04/15 1534 .  heparin injection 5,000 Units, 5,000 Units, Subcutaneous, 3 times per day, Roswell Nickel, MD, 5,000 Units at 09/04/15 1535 .  hydrALAZINE (APRESOLINE) injection 10 mg, 10 mg, Intravenous, Q4H PRN, Maryellen Pile, MD, 10 mg at 09/04/15 1226 .  insulin regular (NOVOLIN R,HUMULIN R) 250 Units in sodium chloride 0.9 % 250 mL (1 Units/mL) infusion, , Intravenous, Continuous, Maryellen Pile, MD, Last Rate: 9.8 mL/hr at 09/04/15 1816, 9.8 Units/hr at 09/04/15 1816 .  propofol (DIPRIVAN) 10 mg/mL bolus/IV push 20 mg, 20 mg, Intravenous, Once, Roswell Nickel, MD .  propofol (DIPRIVAN) 1000 MG/100ML infusion, 0-50 mcg/kg/min, Intravenous, Continuous, Duffy Bruce, MD, Last Rate: 13.6 mL/hr at 09/04/15 1720, 30 mcg/kg/min at 09/04/15 1720 .  sodium bicarbonate 150 mEq in dextrose 5 % 1,000 mL infusion, , Intravenous, Continuous, Raylene Miyamoto, MD, Last Rate: 50 mL/hr at 09/04/15 1156   Neurologic Examination:                                                                                                     Today's Vitals   09/04/15 1800 09/04/15 1815 09/04/15 1830 09/04/15 1845  BP: 137/67 149/68 133/64 116/59  Pulse: 75 77 74 72  Temp:      TempSrc:      Resp: _0 Height:      Weight:      SpO2: 100% 100% 100% 100%   Intubated, tracks examiner in both directions, does not follow commands no voluntary movement. Minimal withdrawal to stimulation.   Lab Results: Basic Metabolic Panel:  Recent Labs Lab  09/02/15 2246 09/03/15 0036 09/03/15 0721 09/03/15 1242 09/03/15 1640 09/03/15 2014 09/04/15 0030 09/04/15 0511 09/04/15 0802 09/04/15 1013 09/04/15 1745  NA 128* 133* 136 137 137 136 138 138  --  137  --   K 3.7 3.4* 3.3* 3.3* 3.0* 3.2* 4.5 3.4*  --  3.2*  --   CL 100* 104 109 109 107 108 114* 111  --  109  --   CO2 13* 17* 15* 16* 15* 16* 14* 15*  --  16*  --   GLUCOSE 606* 496* 177* 112* 159* 220* 127* 156*  --  241*  --   BUN 72* 70* 66* 63* 59* 58* 57* 52*  --  48*  --   CREATININE 4.80* 4.92* 4.46* 4.55* 4.51* 4.29* 4.12* 4.06*  --  4.14*  --   CALCIUM 8.0* 8.5* 8.2* 8.3* 8.6* 8.4* 8.7* 8.5*  --  8.5*  --   MG 2.4 2.3 2.0 1.9  --   --   --   --   --  1.9  --   PHOS 2.0* 1.3* 2.9 3.2 3.5 3.0 3.0 3.6 3.4 3.5 3.0    Liver Function Tests:  Recent Labs Lab 09/02/15 1630 09/03/15 0036  AST 24  19  ALT 12* 12*  ALKPHOS 125 90  BILITOT 0.5 0.2*  PROT 6.9 6.0*  ALBUMIN 3.5 2.9*   No results for input(s): LIPASE, AMYLASE in the last 168 hours.  Recent Labs Lab 09/02/15 2047  AMMONIA 37*    CBC:  Recent Labs Lab 09/02/15 1844 09/02/15 2053 09/03/15 0036 09/03/15 0721 09/04/15 1013  WBC 23.8* 23.8* 15.0* 12.0* 9.7  NEUTROABS 22.0* 21.8*  --   --  7.7  HGB 12.4* 13.1 9.8* 8.8* 9.6*  HCT 33.2* 34.5* 25.9* 23.9* 27.3*  MCV 76.3* 76.2* 74.6* 74.2* 75.8*  PLT 254 195 174 156 146*    Cardiac Enzymes:  Recent Labs Lab 09/02/15 2053 09/03/15 0036 09/03/15 0721 09/03/15 1242  CKTOTAL  --  138  --   --   CKMB  --  11.5*  --   --   TROPONINI 0.13*  --  1.36*  1.22* 0.95*    Lipid Panel: No results for input(s): CHOL, TRIG, HDL, CHOLHDL, VLDL, LDLCALC in the last 168 hours.  CBG:  Recent Labs Lab 09/04/15 1420 09/04/15 1522 09/04/15 1627 09/04/15 1714 09/04/15 1814  GLUCAP 258* 240* 232* 199* 182*    Microbiology: Results for orders placed or performed during the hospital encounter of 09/02/15  Urine culture     Status: None   Collection  Time: 09/02/15  7:18 PM  Result Value Ref Range Status   Specimen Description URINE, CATHETERIZED  Final   Special Requests Normal  Final   Culture NO GROWTH 2 DAYS  Final   Report Status 09/04/2015 FINAL  Final  Blood culture (routine x 2)     Status: None (Preliminary result)   Collection Time: 09/02/15  7:53 PM  Result Value Ref Range Status   Specimen Description BLOOD RIGHT HAND  Final   Special Requests IN PEDIATRIC BOTTLE 1CC  Final   Culture NO GROWTH 2 DAYS  Final   Report Status PENDING  Incomplete  Blood culture (routine x 2)     Status: None (Preliminary result)   Collection Time: 09/02/15  7:55 PM  Result Value Ref Range Status   Specimen Description BLOOD LEFT HAND  Final   Special Requests IN PEDIATRIC BOTTLE 1CC  Final   Culture NO GROWTH 2 DAYS  Final   Report Status PENDING  Incomplete  MRSA PCR Screening     Status: None   Collection Time: 09/02/15  9:22 PM  Result Value Ref Range Status   MRSA by PCR NEGATIVE NEGATIVE Final    Comment:        The GeneXpert MRSA Assay (FDA approved for NASAL specimens only), is one component of a comprehensive MRSA colonization surveillance program. It is not intended to diagnose MRSA infection nor to guide or monitor treatment for MRSA infections.     Imaging: Ct Head Wo Contrast  09/02/2015  CLINICAL DATA:  Patient found by family on the floor. Recent seizure. EXAM: CT HEAD WITHOUT CONTRAST CT CERVICAL SPINE WITHOUT CONTRAST TECHNIQUE: Multidetector CT imaging of the head and cervical spine was performed following the standard protocol without intravenous contrast. Multiplanar CT image reconstructions of the cervical spine were also generated. COMPARISON:  MRI brain 02/18/2015 FINDINGS: CT HEAD FINDINGS Ventricles and sulci are appropriate for patient's age. No evidence for acute cortically based infarct, intracranial hemorrhage, mass lesion or mass-effect. Orbits are unremarkable. Small amount of fluid within the left  maxillary sinus. Remainder of the paranasal sinuses are unremarkable. Mastoid air cells are well aerated. Calvarium  is intact. CT CERVICAL SPINE FINDINGS Normal anatomic alignment. No evidence for acute fracture or dislocation. Preservation of the vertebral body and intervertebral disc space heights. Craniocervical junction is intact. Lung apices are unremarkable. ET tube terminates in the distal trachea visualized thyroid is unremarkable. IMPRESSION: No acute intracranial process. No acute cervical spine fracture. Electronically Signed   By: Lovey Newcomer M.D.   On: 09/02/2015 17:24   Ct Cervical Spine Wo Contrast  09/02/2015  CLINICAL DATA:  Patient found by family on the floor. Recent seizure. EXAM: CT HEAD WITHOUT CONTRAST CT CERVICAL SPINE WITHOUT CONTRAST TECHNIQUE: Multidetector CT imaging of the head and cervical spine was performed following the standard protocol without intravenous contrast. Multiplanar CT image reconstructions of the cervical spine were also generated. COMPARISON:  MRI brain 02/18/2015 FINDINGS: CT HEAD FINDINGS Ventricles and sulci are appropriate for patient's age. No evidence for acute cortically based infarct, intracranial hemorrhage, mass lesion or mass-effect. Orbits are unremarkable. Small amount of fluid within the left maxillary sinus. Remainder of the paranasal sinuses are unremarkable. Mastoid air cells are well aerated. Calvarium is intact. CT CERVICAL SPINE FINDINGS Normal anatomic alignment. No evidence for acute fracture or dislocation. Preservation of the vertebral body and intervertebral disc space heights. Craniocervical junction is intact. Lung apices are unremarkable. ET tube terminates in the distal trachea visualized thyroid is unremarkable. IMPRESSION: No acute intracranial process. No acute cervical spine fracture. Electronically Signed   By: Lovey Newcomer M.D.   On: 09/02/2015 17:24   Mr Brain Wo Contrast  09/02/2015  CLINICAL DATA:  Code stroke. Unresponsive.  Evaluate for basilar thrombosis. EXAM: MRA HEAD WITHOUT CONTRAST TECHNIQUE: Angiographic images of the Circle of Willis were obtained using MRA technique without intravenous contrast. COMPARISON:  Head CT from earlier today.  MRI 02/18/2015 FINDINGS: Axial diffusion was performed and is negative for acute infarct. Symmetric carotid arteries at the upper cervical segment. Extensive atherosclerotic irregularity of the bilateral siphons consistent with atheromatous disease. There is a new flow gap at the anterior genu of the right ICA with susceptibility artifact that does not correlate with metal, stent, or gas on prior head CT. There is also poor signal at the left carotid terminus extending into the A1 segment. Beyond these apparent stenoses, there is robust and symmetric flow related signal. Dominant right vertebral artery. Extensive atherosclerotic irregularity of the left V4 segment, likely occluded distally on a chronic basis .There is atheromatous narrowing of the left proximal PICA. Atheromatous narrowing of the right P1 P2 segment that is mild. No major vessel occlusion in the vertebrobasilar distribution. IMPRESSION: 1. Negative for basilar thrombosis.  No acute infarct. 2. Apparent high-grade stenosis at the left carotid terminus and right cavernous carotid are suspicious for artifact given rapid development since 02/18/2015 study and associated susceptibility artifact. Consider follow-up or CTA depending on clinical circumstances. 3. 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 Electronically Signed   By: Monte Fantasia M.D.   On: 09/02/2015 18:35   Dg Chest Port 1 View  09/04/2015  CLINICAL DATA:  Acute respiratory failure, intubated patient, diabetic ketoacidosis, acute and chronic renal failure. EXAM: PORTABLE CHEST 1 VIEW COMPARISON:  Portable chest x-ray of September 02, 2015 FINDINGS: The lungs are well-expanded. There is no focal infiltrate.  There is no pleural effusion or pneumothorax. The heart and pulmonary vascularity are normal. The mediastinum is normal in width. The endotracheal tube tip lies approximately 6.3 cm above the carina. The  esophagogastric tube tip projects below the inferior margin of the image. IMPRESSION: Interval improvement in the appearance of the right lung base with resolution of subsegmental atelectasis or infiltrate. There is no evidence of pneumonia nor pulmonary edema. No pleural effusions are evident. The support tubes are in stable position. Electronically Signed   By: David  Martinique M.D.   On: 09/04/2015 07:23   Dg Chest Port 1 View  09/02/2015  CLINICAL DATA:  Patient status post ET tube placement. EXAM: PORTABLE CHEST 1 VIEW COMPARISON:  Chest radiograph 02/20/2015 FINDINGS: ET tube terminates in the distal trachea. Monitoring leads overlie the patient. Stable enlarged cardiac and mediastinal contours. Elevation right hemidiaphragm. Heterogeneous opacities right lung base. No pleural effusion or pneumothorax. IMPRESSION: ET tube terminates in the distal trachea. Elevation right hemidiaphragm. Heterogeneous opacities right lung base may represent atelectasis, aspiration or infection. Electronically Signed   By: Lovey Newcomer M.D.   On: 09/02/2015 17:33   Dg Abd Portable 1v  09/02/2015  CLINICAL DATA:  Enteric tube placed EXAM: PORTABLE ABDOMEN - 1 VIEW COMPARISON:  02/14/2015 abdominal radiograph FINDINGS: Enteric tube terminates in the distal stomach. Mild gaseous distention of the stomach. No dilated small bowel loops in the visualized upper abdomen. No evidence of pneumatosis or pneumoperitoneum. IMPRESSION: Enteric tube terminates in the distal stomach. Electronically Signed   By: Ilona Sorrel M.D.   On: 09/02/2015 22:17    Assessment and plan:   Russell Price is an 55 y.o. male patient with acute encephalopathy secondary to severe hyperglycemia, off of sedation, more awake and tracks examiner, remains  intubated. On Vent weaning trial. EEG showed evidence of moderate encephalopathy. No seizures. End-stage renal failure, GFR 17. Hence CTA deferred.   Consider repeating vascular imaging with MRA of the head and neck without contrast in a couple of weeks when clinically stable, for follow-up of the abnormalities noted on the prior imaging in which artifacts were suspected. We will follow-up PRN. Please call if any questions.

## 2015-09-04 NOTE — Progress Notes (Signed)
  Echocardiogram 2D Echocardiogram has been performed.  Darlina Sicilian M 09/04/2015, 1:50 PM

## 2015-09-05 ENCOUNTER — Inpatient Hospital Stay (HOSPITAL_COMMUNITY): Payer: Medicaid Other

## 2015-09-05 LAB — GLUCOSE, CAPILLARY
GLUCOSE-CAPILLARY: 129 mg/dL — AB (ref 65–99)
GLUCOSE-CAPILLARY: 107 mg/dL — AB (ref 65–99)
GLUCOSE-CAPILLARY: 122 mg/dL — AB (ref 65–99)
GLUCOSE-CAPILLARY: 196 mg/dL — AB (ref 65–99)
GLUCOSE-CAPILLARY: 198 mg/dL — AB (ref 65–99)
Glucose-Capillary: 103 mg/dL — ABNORMAL HIGH (ref 65–99)
Glucose-Capillary: 158 mg/dL — ABNORMAL HIGH (ref 65–99)
Glucose-Capillary: 205 mg/dL — ABNORMAL HIGH (ref 65–99)
Glucose-Capillary: 130 mg/dL — ABNORMAL HIGH (ref 65–99)
Glucose-Capillary: 135 mg/dL — ABNORMAL HIGH (ref 65–99)
Glucose-Capillary: 148 mg/dL — ABNORMAL HIGH (ref 65–99)
Glucose-Capillary: 162 mg/dL — ABNORMAL HIGH (ref 65–99)
Glucose-Capillary: 174 mg/dL — ABNORMAL HIGH (ref 65–99)
Glucose-Capillary: 206 mg/dL — ABNORMAL HIGH (ref 65–99)
Glucose-Capillary: 224 mg/dL — ABNORMAL HIGH (ref 65–99)
Glucose-Capillary: 225 mg/dL — ABNORMAL HIGH (ref 65–99)
Glucose-Capillary: 406 mg/dL — ABNORMAL HIGH (ref 65–99)

## 2015-09-05 LAB — URINALYSIS, ROUTINE W REFLEX MICROSCOPIC
BILIRUBIN URINE: NEGATIVE
Glucose, UA: >1000 mg/dL — AB
KETONES UR: 15 mg/dL — AB
Leukocytes, UA: NEGATIVE
Nitrite: NEGATIVE
PH: 5.5 (ref 5.0–8.0)
Protein, ur: 100 mg/dL — AB
SPECIFIC GRAVITY, URINE: 1.017 (ref 1.005–1.030)

## 2015-09-05 LAB — URINE MICROSCOPIC-ADD ON

## 2015-09-05 LAB — PHOSPHORUS
Phosphorus: 2.8 mg/dL (ref 2.5–4.6)
Phosphorus: 3.3 mg/dL (ref 2.5–4.6)
Phosphorus: 3.5 mg/dL (ref 2.5–4.6)
Phosphorus: 4.2 mg/dL (ref 2.5–4.6)

## 2015-09-05 LAB — CBC
HEMATOCRIT: 23.9 % — AB (ref 39.0–52.0)
HEMOGLOBIN: 8.3 g/dL — AB (ref 13.0–17.0)
MCH: 26.9 pg (ref 26.0–34.0)
MCHC: 34.7 g/dL (ref 30.0–36.0)
MCV: 77.3 fL — ABNORMAL LOW (ref 78.0–100.0)
PLATELETS: 129 10*3/uL — AB (ref 150–400)
RBC: 3.09 MIL/uL — AB (ref 4.22–5.81)
RDW: 13.4 % (ref 11.5–15.5)
WBC: 8.2 10*3/uL (ref 4.0–10.5)

## 2015-09-05 LAB — BASIC METABOLIC PANEL
ANION GAP: 11 (ref 5–15)
BUN: 46 mg/dL — AB (ref 6–20)
CHLORIDE: 111 mmol/L (ref 101–111)
CO2: 21 mmol/L — ABNORMAL LOW (ref 22–32)
Calcium: 8.3 mg/dL — ABNORMAL LOW (ref 8.9–10.3)
Creatinine, Ser: 4.14 mg/dL — ABNORMAL HIGH (ref 0.61–1.24)
GFR calc Af Amer: 17 mL/min — ABNORMAL LOW (ref >60)
GFR calc non Af Amer: 15 mL/min — ABNORMAL LOW (ref >60)
Glucose, Bld: 153 mg/dL — ABNORMAL HIGH (ref 65–99)
POTASSIUM: 3.2 mmol/L — AB (ref 3.5–5.1)
SODIUM: 143 mmol/L (ref 135–145)

## 2015-09-05 LAB — MAGNESIUM: Magnesium: 2 mg/dL (ref 1.7–2.4)

## 2015-09-05 MED ORDER — FAMOTIDINE IN NACL 20-0.9 MG/50ML-% IV SOLN
20.0000 mg | INTRAVENOUS | Status: DC
  Filled 2015-09-05: qty 50

## 2015-09-05 MED ORDER — FAMOTIDINE 20 MG PO TABS
20.0000 mg | ORAL_TABLET | Freq: Every day | ORAL | Status: DC
  Administered 2015-09-07 – 2015-09-08 (×2): 20 mg via ORAL
  Filled 2015-09-05 (×2): qty 1

## 2015-09-05 MED ORDER — POTASSIUM CHLORIDE 20 MEQ/15ML (10%) PO SOLN
40.0000 meq | Freq: Once | ORAL | Status: AC
  Administered 2015-09-05: 40 meq via ORAL
  Filled 2015-09-05: qty 30

## 2015-09-05 MED ORDER — INSULIN GLARGINE 100 UNIT/ML ~~LOC~~ SOLN
10.0000 [IU] | Freq: Once | SUBCUTANEOUS | Status: AC
  Administered 2015-09-05: 10 [IU] via SUBCUTANEOUS
  Filled 2015-09-05: qty 0.1

## 2015-09-05 MED ORDER — ACETAMINOPHEN 160 MG/5ML PO SOLN
650.0000 mg | Freq: Four times a day (QID) | ORAL | Status: DC | PRN
  Administered 2015-09-05: 650 mg
  Filled 2015-09-05: qty 20.3

## 2015-09-05 MED ORDER — SODIUM CHLORIDE 0.9 % IV SOLN
INTRAVENOUS | Status: DC
  Administered 2015-09-05: 3.5 [IU]/h via INTRAVENOUS
  Administered 2015-09-05: 7.5 [IU]/h via INTRAVENOUS
  Filled 2015-09-05: qty 2.5

## 2015-09-05 MED ORDER — ACETAMINOPHEN 325 MG PO TABS: 650.0000 mg | ORAL_TABLET | Freq: Four times a day (QID) | ORAL | Status: DC | PRN

## 2015-09-05 MED ORDER — FAMOTIDINE 40 MG/5ML PO SUSR: 20.0000 mg | Freq: Every day | ORAL | Status: DC

## 2015-09-05 NOTE — Progress Notes (Signed)
PULMONARY / CRITICAL CARE MEDICINE   Name: Russell Price MRN: [id number] DOB: 1960-11-27    ADMISSION DATE:  09/02/2015  CHIEF COMPLAINT:  Seizure  HISTORY OF PRESENT ILLNESS:   55 yo male with h/o CKD-4, HLD, uncontrolled DM, right eye blindness who presents to the hospital BIBA after found unresponsive by wife.  The patient is sedated and the HPI is provided by his wife, who states that over the last few days he had been feeling weak and fatigued.  Today he felt the same but was able to function normally, she saw him normal about 1-130pm then she went out for about 30 min or so, upon her return he was unresponsive.  She did not notice and shaking at that time but did notice 2 lacerations on his head.    Per EMS report, en route he was noted to have some extensor posturing and there was suspicion for seizure activity, upon arrival he was minimally responsive and was intubated in the ED for airway protection.   SUBJECTIVE: sedated on vent, spiked fever of 101.1 this morning  VITAL SIGNS: Temp:  [99 F (37.2 C)-101.1 F (38.4 C)] 101.1 F (38.4 C) (04/18 0400) Pulse Rate:  [66-104] 71 (04/18 0800) Resp:  [17-29] 26 (04/18 0800) BP: (93-191)/(45-106) 118/60 mmHg (04/18 0800) SpO2:  [99 %-100 %] 100 % (04/18 0800) FiO2 (%):  [40 %] 40 % (04/18 0600) Weight:  [75.8 kg (167 lb 1.7 oz)] 75.8 kg (167 lb 1.7 oz) (04/18 0200) HEMODYNAMICS:   VENTILATOR SETTINGS: Vent Mode:  [-] PRVC FiO2 (%):  [40 %] 40 % Set Rate:  [26 bmp-27 bmp] 26 bmp Vt Set:  [560 mL] 560 mL PEEP:  [5 cmH20] 5 cmH20 Pressure Support:  [5 cmH20] 5 cmH20 Plateau Pressure:  [13 [id number] cmH20] 15 cmH20 INTAKE / OUTPUT:  Intake/Output Summary (Last 24 hours) at 09/05/15 [id number] Last data filed at 09/05/15 0700  Gross per 24 hour  Intake 2701.16 ml  Output   1810 ml  Net 891.16 ml    PHYSICAL EXAMINATION: General:  Sedated, oes not wake to voiced Neuro:  Sedated on propofol, RASS -3 HEENT:  abrasions on  face Cardiovascular:  RRR, no m/r/g Lungs:  Mechanical breath sounds b/l, no w/r/r Abdomen:  Soft, non-tender, non distended, normal bowel sounds Musculoskeletal:  Normal bulk and tone Skin:  Lacerations as above.  No c/c/e  LABS:  CBC  Recent Labs Lab 09/03/15 0721 09/04/15 1013 09/05/15 0523  WBC 12.0* 9.7 8.2  HGB 8.8* 9.6* 8.3*  HCT 23.9* 27.3* 23.9*  PLT 156 146* 129*   Coag's  Recent Labs Lab 09/03/15 0721  APTT 26  INR 1.29   BMET  Recent Labs Lab 09/04/15 1013 09/04/15 2152 09/05/15 0523  NA 137 142 143  K 3.2* 3.5 3.2*  CL 109 114* 111  CO2 16* 19* 21*  BUN 48* 43* 46*  CREATININE 4.14* 3.95* 4.14*  GLUCOSE 241* 125* 153*   Electrolytes  Recent Labs Lab 09/03/15 1242  09/04/15 1013  09/04/15 2152 09/05/15 0004 09/05/15 0523  CALCIUM 8.3*  < > 8.5*  --  8.4*  --  8.3*  MG 1.9  --  1.9  --  1.9  --   --   PHOS 3.2  < > 3.5  < > 3.2 3.5 2.8  < > = values in this interval not displayed. Sepsis Markers  Recent Labs Lab 09/03/15 0036 09/03/15 0721 09/03/15 1242  LATICACIDVEN 3.3* 2.3* 1.5  ABG  Recent Labs Lab 09/02/15 1831 09/02/15 2123 09/04/15 1103  PHART 7.080* 7.247* 7.364  PCO2ART 53.0* 38.0 29.7*  PO2ART 95.0 150* 127.0*   Liver Enzymes  Recent Labs Lab 09/02/15 1630 09/03/15 0036  AST 24 19  ALT 12* 12*  ALKPHOS 125 90  BILITOT 0.5 0.2*  ALBUMIN 3.5 2.9*   Cardiac Enzymes  Recent Labs Lab 09/02/15 2053 09/03/15 0721 09/03/15 1242  TROPONINI 0.13* 1.36*  1.22* 0.95*   Glucose  Recent Labs Lab 09/04/15 1918 09/04/15 2024 09/04/15 2126 09/04/15 2220 09/04/15 2325 09/05/15 0021  GLUCAP 158* 129* 103* 107* 122* 205*    Imaging No results found. Micro: 4/15 BCx>> 4/15 UCx>> 4/18 BCx>>  Lines/Tubes: ETT 7.50mm 4/15>> OG 4/15 >> Foley 4/15>>  Abx: Ceftriaxone 4/15 x1  Studies:  MR Brain 4/15: No acute infarct, high gtrade stenosis at left carotid terminus and right cavernous carotid. CT  head 4/15: No acute process pCXR 4/15: Elevated right hemidiaphragm, heterogenous opacities right lung 4/17 Echo: LVEF 55-60%, inferobasal hypokinesis, no RWMA pCXR 4/18:  No evidence of pulmonary infiltrate  ASSESSMENT / PLAN: 55 yo male admitted with acute encephalopathy and possible seizure 2/2 DKA, unclear precipitant but it appears the patient's DM has been poorly controlled for some time.   PULMONARY A: - Intubated for airway protection - Monitor for PNA/ Aspiration  P:   -pcxr neg, no need repeat - daily SBT, cpap 5 ps 5, goal 30 min, assess rsbi -bicarb for NONAG  CARDIOVASCULAR  A:  - HTN more controled - Hypovolemia - Troponin leak>> likely demand peak 1.36 last 0.95 P:  - carvedilol 6.25 bid - amlodipine 10mg  Qday - no heparin at this time, not a good cath candidate with his renal dysfunction, may need to consider stress testing prior to discharge -asa - echo assessment: no RWMA -hydral PRN  RENAL A:   - CKD 4 (baseline Scr between 3 and 5) - Uremia - chronic - NAGMA - 2/2 CKD - Lactic acidemia- resolved -NONAG acidosis (saline, rta 4?) -hypokalemia P:   - currently at baseline Scr - UOP currently adequate - avoid nephrotoxins - Maintain UOP >0.5cc/kg/hr -dc barb -K supp - avoid saline  GASTROINTESTINAL A:   - no active issues at present time P:   - pepcid -tube feeds, hold if weaning well  HEMATOLOGIC A:   Normocytic anemia DVt prevention P:  - CBC -sub q hep  INFECTIOUS A:   Leukocytosis trending down Fever 4/18 P:   BCx2 09/02/2015 BCx x2 09/05/15 UC 09/02/2015 Sputum 09/02/2015 Abx: none  Assess BC, UA  ENDOCRINE A:   - Severe DKA - life threatening, resolbved, gap closed - DM - HLD   P:   - DKA protocol off to hyperglycemia protocol - LR fluid resuscitation - insulin gtt off DKA protocol, consider changing to [place] - POC BG checks - potassium/phos monitoring - electrolyte repletion protocol  NEUROLOGIC A:   - Acute  encephalopathy - Possible provoked seizure- hyperglycemia P:   RASS goal: -2 - propofol gtt , WUA now  FAMILY  - Updates:  By DF to family  Lucious Groves, DO IMTS PGY-3 Pulmonary and [place] Pager: [phone]  09/05/2015, 8:08 AM  STAFF NOTE: Linwood Dibbles, MD FACP have personally reviewed patient's available data, including medical history, events of note, physical examination and test results as part of my evaluation. I have discussed with resident/NP and other care providers such as pharmacist, RN and  RRT. In addition, I personally evaluated patient and elicited key findings of: prop off, WUA, fc simple, on wean, lungs clear, weaning aggressive cpap 5 ps 5, goal 30 min , upright position, Echo noted, will need risk stratification by cards pre dc , keep tele, htn more controlled, dc bicarb as co2 21, r/o rta 4 in future, fever uncleasr x 1 only, UA, BC, if spikes further, re add ceftaz, vanc, consider LP, i updated wife The patient is critically ill with multiple organ systems failure and requires high complexity decision making for assessment and support, frequent evaluation and titration of therapies, application of advanced monitoring technologies and extensive interpretation of multiple databases.   Critical Care Time devoted to patient care services described in this note is 30 Minutes. This time reflects time of care of this signee: Merrie Roof, MD FACP. This critical care time does not reflect procedure time, or teaching time or supervisory time of PA/NP/Med student/Med Resident etc but could involve care discussion time. Rest per NP/medical resident whose note is outlined above and that I agree with   Lavon Paganini. Titus Mould, MD, [place] Pgr: [facility] 09/05/2015 9:07 AM

## 2015-09-05 NOTE — Procedures (Signed)
Extubation Procedure Note  Patient Details:   Name: Russell Price DOB: [birth year] MRN: [id number]   Airway Documentation:     Evaluation  O2 sats: stable throughout Complications: No apparent complications Patient did tolerate procedure well. Bilateral Breath Sounds: Diminished, Clear   Yes   Patient extubated to 4 LNC at this time per MD order. Positive cuff leak. pateint able to clear secretions and vocalize. RT to monitor as needed  Saunders Glance 09/05/2015, 9:29 AM

## 2015-09-06 DIAGNOSIS — G9340 Encephalopathy, unspecified: Secondary | ICD-10-CM

## 2015-09-06 DIAGNOSIS — R29898 Other symptoms and signs involving the musculoskeletal system: Secondary | ICD-10-CM

## 2015-09-06 LAB — MISC LABCORP TEST (SEND OUT): LABCORP TEST CODE: 4788

## 2015-09-06 LAB — GLUCOSE, CAPILLARY
GLUCOSE-CAPILLARY: 104 mg/dL — AB (ref 65–99)
GLUCOSE-CAPILLARY: 105 mg/dL — AB (ref 65–99)
GLUCOSE-CAPILLARY: 131 mg/dL — AB (ref 65–99)
GLUCOSE-CAPILLARY: 159 mg/dL — AB (ref 65–99)
GLUCOSE-CAPILLARY: 165 mg/dL — AB (ref 65–99)
GLUCOSE-CAPILLARY: 168 mg/dL — AB (ref 65–99)
GLUCOSE-CAPILLARY: 170 mg/dL — AB (ref 65–99)
GLUCOSE-CAPILLARY: 175 mg/dL — AB (ref 65–99)
GLUCOSE-CAPILLARY: 312 mg/dL — AB (ref 65–99)
GLUCOSE-CAPILLARY: 397 mg/dL — AB (ref 65–99)
Glucose-Capillary: 121 mg/dL — ABNORMAL HIGH (ref 65–99)
Glucose-Capillary: 159 mg/dL — ABNORMAL HIGH (ref 65–99)
Glucose-Capillary: 163 mg/dL — ABNORMAL HIGH (ref 65–99)
Glucose-Capillary: 166 mg/dL — ABNORMAL HIGH (ref 65–99)
Glucose-Capillary: 173 mg/dL — ABNORMAL HIGH (ref 65–99)
Glucose-Capillary: 189 mg/dL — ABNORMAL HIGH (ref 65–99)
Glucose-Capillary: 190 mg/dL — ABNORMAL HIGH (ref 65–99)
Glucose-Capillary: 198 mg/dL — ABNORMAL HIGH (ref 65–99)
Glucose-Capillary: 248 mg/dL — ABNORMAL HIGH (ref 65–99)
Glucose-Capillary: 273 mg/dL — ABNORMAL HIGH (ref 65–99)

## 2015-09-06 LAB — CBC
HCT: 27.2 % — ABNORMAL LOW (ref 39.0–52.0)
HEMOGLOBIN: 9.2 g/dL — AB (ref 13.0–17.0)
MCH: 27 pg (ref 26.0–34.0)
MCHC: 33.8 g/dL (ref 30.0–36.0)
MCV: 79.8 fL (ref 78.0–100.0)
Platelets: 135 10*3/uL — ABNORMAL LOW (ref 150–400)
RBC: 3.41 MIL/uL — ABNORMAL LOW (ref 4.22–5.81)
RDW: 13.6 % (ref 11.5–15.5)
WBC: 9 10*3/uL (ref 4.0–10.5)

## 2015-09-06 LAB — BASIC METABOLIC PANEL
ANION GAP: 12 (ref 5–15)
BUN: 44 mg/dL — ABNORMAL HIGH (ref 6–20)
CALCIUM: 8.8 mg/dL — AB (ref 8.9–10.3)
CO2: 21 mmol/L — AB (ref 22–32)
Chloride: 114 mmol/L — ABNORMAL HIGH (ref 101–111)
Creatinine, Ser: 3.99 mg/dL — ABNORMAL HIGH (ref 0.61–1.24)
GFR calc Af Amer: 18 mL/min — ABNORMAL LOW (ref >60)
GFR calc non Af Amer: 15 mL/min — ABNORMAL LOW (ref >60)
GLUCOSE: 128 mg/dL — AB (ref 65–99)
Potassium: 4.1 mmol/L (ref 3.5–5.1)
Sodium: 147 mmol/L — ABNORMAL HIGH (ref 135–145)

## 2015-09-06 LAB — PHOSPHORUS: PHOSPHORUS: 4.2 mg/dL (ref 2.5–4.6)

## 2015-09-06 LAB — TRIGLYCERIDES: Triglycerides: 130 mg/dL (ref <150)

## 2015-09-06 LAB — MAGNESIUM: MAGNESIUM: 2.1 mg/dL (ref 1.7–2.4)

## 2015-09-06 MED ORDER — INSULIN GLARGINE 100 UNIT/ML ~~LOC~~ SOLN
20.0000 [IU] | Freq: Every day | SUBCUTANEOUS | Status: DC
  Administered 2015-09-06 – 2015-09-08 (×3): 20 [IU] via SUBCUTANEOUS
  Filled 2015-09-06 (×3): qty 0.2

## 2015-09-06 MED ORDER — INSULIN ASPART 100 UNIT/ML ~~LOC~~ SOLN
0.0000 [IU] | SUBCUTANEOUS | Status: DC
  Administered 2015-09-06 (×4): 4 [IU] via SUBCUTANEOUS
  Administered 2015-09-07: 11 [IU] via SUBCUTANEOUS
  Administered 2015-09-07: 3 [IU] via SUBCUTANEOUS
  Administered 2015-09-07: 4 [IU] via SUBCUTANEOUS
  Administered 2015-09-07 (×2): 7 [IU] via SUBCUTANEOUS
  Administered 2015-09-08: 3 [IU] via SUBCUTANEOUS
  Administered 2015-09-08: 4 [IU] via SUBCUTANEOUS

## 2015-09-06 MED ORDER — ONDANSETRON HCL 4 MG/2ML IJ SOLN
4.0000 mg | Freq: Three times a day (TID) | INTRAMUSCULAR | Status: DC | PRN
  Administered 2015-09-06: 4 mg via INTRAVENOUS
  Filled 2015-09-06: qty 2

## 2015-09-06 MED ORDER — SODIUM CHLORIDE 0.45 % IV SOLN
INTRAVENOUS | Status: DC
  Administered 2015-09-06 – 2015-09-08 (×3): via INTRAVENOUS

## 2015-09-06 MED ORDER — ONDANSETRON HCL 4 MG PO TABS: 4.0000 mg | ORAL_TABLET | Freq: Three times a day (TID) | ORAL | Status: DC | PRN

## 2015-09-06 MED ORDER — METOCLOPRAMIDE HCL 5 MG/ML IJ SOLN
5.0000 mg | Freq: Three times a day (TID) | INTRAMUSCULAR | Status: DC
  Administered 2015-09-06 – 2015-09-08 (×6): 5 mg via INTRAVENOUS
  Filled 2015-09-06 (×2): qty 2
  Filled 2015-09-06 (×2): qty 1
  Filled 2015-09-06: qty 2
  Filled 2015-09-06 (×2): qty 1
  Filled 2015-09-06: qty 2

## 2015-09-06 NOTE — Progress Notes (Signed)
PULMONARY / CRITICAL CARE MEDICINE   Name: Russell Price MRN: [id number] DOB: 03/13/1961    ADMISSION DATE:  09/02/2015  CHIEF COMPLAINT:  Seizure  HISTORY OF PRESENT ILLNESS:   55 yo male with h/o CKD-4, HLD, uncontrolled DM, right eye blindness who presents to the hospital BIBA after found unresponsive by wife.  The patient is sedated and the HPI is provided by his wife, who states that over the last few days he had been feeling weak and fatigued.  Today he felt the same but was able to function normally, she saw him normal about 1-130pm then she went out for about 30 min or so, upon her return he was unresponsive.  She did not notice and shaking at that time but did notice 2 lacerations on his head.    Per EMS report, en route he was noted to have some extensor posturing and there was suspicion for seizure activity, upon arrival he was minimally responsive and was intubated in the ED for airway protection.    SUBJECTIVE: extubated yesterday, did have episode of vomiting, transitioned off drip to lantus but became hyperglycemic and was restarted on insulin drip  VITAL SIGNS: Temp:  [98.4 F (36.9 C)-99.7 F (37.6 C)] 98.7 F (37.1 C) (04/19 0419) Pulse Rate:  [68-92] 77 (04/19 0700) Resp:  [16-26] 20 (04/19 0700) BP: (110-176)/(45-74) 163/71 mmHg (04/19 0700) SpO2:  [83 %-100 %] 99 % (04/19 0700) FiO2 (%):  [40 %] 40 % (04/18 0837) Weight:  [72.1 kg (158 lb 15.2 oz)] 72.1 kg (158 lb 15.2 oz) (04/19 0200) HEMODYNAMICS:   VENTILATOR SETTINGS: Vent Mode:  [-] CPAP;PSV FiO2 (%):  [40 %] 40 % PEEP:  [5 cmH20] 5 cmH20 Pressure Support:  [5 cmH20] 5 cmH20 INTAKE / OUTPUT:  Intake/Output Summary (Last 24 hours) at 09/06/15 0749 Last data filed at 09/06/15 0739  Gross per 24 hour  Intake 345.86 ml  Output   2085 ml  Net -1739.14 ml    PHYSICAL EXAMINATION: General:  Resting in bed Neuro:  Awake alert oriented x4 HEENT:  abrasions on face Cardiovascular:  RRR, no  m/r/g Lungs:  CTAB Abdomen:  Soft, non-tender, non distended, normal bowel sounds Musculoskeletal:  Normal bulk and tone, moving all ext equally Skin:  Lacerations as above.  No c/c/e  LABS:  CBC  Recent Labs Lab 09/04/15 1013 09/05/15 0523 09/06/15 0230  WBC 9.7 8.2 9.0  HGB 9.6* 8.3* 9.2*  HCT 27.3* 23.9* 27.2*  PLT 146* 129* 135*   Coag's  Recent Labs Lab 09/03/15 0721  APTT 26  INR 1.29   BMET  Recent Labs Lab 09/04/15 2152 09/05/15 0523 09/06/15 0230  NA 142 143 147*  K 3.5 3.2* 4.1  CL 114* 111 114*  CO2 19* 21* 21*  BUN 43* 46* 44*  CREATININE 3.95* 4.14* 3.99*  GLUCOSE 125* 153* 128*   Electrolytes  Recent Labs Lab 09/04/15 2152  09/05/15 0523 09/05/15 0838 09/05/15 1158 09/06/15 0230  CALCIUM 8.4*  --  8.3*  --   --  8.8*  MG 1.9  --   --  2.0  --  2.1  PHOS 3.2  < > 2.8 3.3 4.2 4.2  < > = values in this interval not displayed. Sepsis Markers  Recent Labs Lab 09/03/15 0036 09/03/15 0721 09/03/15 1242  LATICACIDVEN 3.3* 2.3* 1.5   ABG  Recent Labs Lab 09/02/15 1831 09/02/15 2123 09/04/15 1103  PHART 7.080* 7.247* 7.364  PCO2ART 53.0* 38.0 29.7*  PO2ART  95.0 150* 127.0*   Liver Enzymes  Recent Labs Lab 09/02/15 1630 09/03/15 0036  AST 24 19  ALT 12* 12*  ALKPHOS 125 90  BILITOT 0.5 0.2*  ALBUMIN 3.5 2.9*   Cardiac Enzymes  Recent Labs Lab 09/02/15 2053 09/03/15 0721 09/03/15 1242  TROPONINI 0.13* 1.36*  1.22* 0.95*   Glucose  Recent Labs Lab 09/05/15 2159 09/05/15 2254 09/05/15 2356 09/06/15 0054 09/06/15 0156 09/06/15 0301  GLUCAP 190* 159* 121* 104* 105* 131*    Imaging No results found. Micro: 4/15 BCx>> NGTD 4/15 UCx>> NGTD 4/18 BCx>>pending  Lines/Tubes: ETT 7.8mm 4/15>> OG 4/15 >> Foley 4/15>>  Abx: Ceftriaxone 4/15 x1  Studies:  MR Brain 4/15: No acute infarct, high gtrade stenosis at left carotid terminus and right cavernous carotid. CT head 4/15: No acute process pCXR 4/15:  Elevated right hemidiaphragm, heterogenous opacities right lung 4/17 Echo: LVEF 55-60%, inferobasal hypokinesis, no RWMA pCXR 4/18:  No evidence of pulmonary infiltrate  ASSESSMENT / PLAN: 55 yo male admitted with acute encephalopathy and possible seizure 2/2 DKA, unclear precipitant but it appears the patient's DM has been poorly controlled for some time.   PULMONARY A: - Monitor for PNA/ Aspiration  P:   Is as able  CARDIOVASCULAR  A:  - HTN , elevated (did not receive PO Meds) - Hypovolemia - resolved - Troponin leak>> likely demand peak 1.36 last 0.95 P:  - carvedilol 6.25 bid  - amlodipine 10mg  Qday - not a good cath candidate with his renal dysfunction, may need to consider stress testing prior to discharge -asa - echo assessment: no RWMA -hydral PRN  RENAL A:   - CKD 4 (baseline Scr between 3 and 5) - Uremia - chronic - NAGMA - 2/2 CKD - Lactic acidemia- resolved -hypokalemia- resolved -hypernatremia  P:   - currently at baseline Scr - UOP currently adequate - avoid nephrotoxins - Maintain UOP >0.5cc/kg/hr -free water for na  GASTROINTESTINAL A:   - ? Need for swallowing eval - Vomiting P:   - pepcid -if does not pass SLP eval may need NGT placed  HEMATOLOGIC A:   Normocytic anemia DVt prevention P:  - CBC -sub q hep  INFECTIOUS A:   Leukocytosis trending down Fever 4/18 P:   BCx2 09/02/2015 BCx x2 09/05/15 UC 09/02/2015 Sputum 09/02/2015 Abx: none UA 4/18 neg  ENDOCRINE A:   - Severe DKA - life threatening, resolbved, gap closed - DM - HLD   P:   - POC BG checks 4 - Transition off drip, Lantus 20u, Resistant sliding scale - potassium/phos monitoring - electrolyte repletion protocol  NEUROLOGIC A:   - Acute encephalopathy- resolving - provoked seizure- hyperglycemia P:    - No AED medications needed on discharge for provoked seizure, need improved glycemic control  FAMILY  - Updates:  Wife at bedside  Lucious Groves,  DO IMTS PGY-3 [facility] [place] Pager: [phone]  09/06/2015, 7:49 AM  STAFF NOTE: I, Merrie Roof, MD FACP have personally reviewed patient's available data, including medical history, events of note, physical examination and test results as part of my evaluation. I have discussed with resident/NP and other care providers such as pharmacist, RN and RRT. In addition, I personally evaluated patient and elicited key findings of: moving all ext even, maybe some slight weakness left, will re evaluate with neuro, todds?, MRi is neg, IS, PT active, lantus 20 , slp then advance diet, Na elevated, 1/2 NS at 40 ,  bmet i am to triad, to floor, HTN, until orals add hydal prn IV, zofran, attempt reglan for vomiting   Lavon Paganini. Titus Mould, MD, [place] Pgr: [facility] 09/06/2015 12:04 PM

## 2015-09-06 NOTE — Evaluation (Signed)
Physical Therapy Evaluation Patient Details Name: Russell Price MRN: [id number] DOB: 02-11-1961 Today's Date: 09/06/2015   History of Present Illness  pt presents after being found down with DKA, Encephalopathy, and Seizure activity.  pt was intubated 4/15 - 4/18. pt with hx of CKD, DM, and R eye blindness.    Clinical Impression  Pt attempts to follow all directions, but question if he is having cognitive deficits as he is able to follow some directions in English and at times is not following all directions from wife in native language.  At this time pt with L sided weakness in UE and LE, but face is symmetrical.  MD made aware of weakness.  Feel pt would benefit from CIR level of therapies at D/C to maximize independence prior to returning to home with his wife.      Follow Up Recommendations CIR    Equipment Recommendations  None recommended by PT    Recommendations for Other Services Rehab consult     Precautions / Restrictions Precautions Precautions: Fall Restrictions Weight Bearing Restrictions: No      Mobility  Bed Mobility Overal bed mobility: Needs Assistance;+2 for physical assistance Bed Mobility: Supine to Sit;Sit to Supine     Supine to sit: Mod assist;+2 for physical assistance Sit to supine: Mod assist;+2 for physical assistance   General bed mobility comments: pt needs A for Bil LEs and bringing trunk up to sitting.  He does attempt to A with mobility.    Transfers Overall transfer level: Needs assistance Equipment used: 2 person hand held assist Transfers: Sit to/from Stand Sit to Stand: Max assist;+2 physical assistance         General transfer comment: pt needs LEs placed on floor and L LE blocked.  L knee hyperextends in standing and attempts at weightbearing require L knee to be blocked to prevent buckling.    Ambulation/Gait                Stairs            Wheelchair Mobility    Modified Rankin (Stroke Patients  Only) Modified Rankin (Stroke Patients Only) Pre-Morbid Rankin Score: No symptoms Modified Rankin: Severe disability     Balance Overall balance assessment: Needs assistance Sitting-balance support: No upper extremity supported;Feet supported Sitting balance-Leahy Scale: Fair   Postural control: Left lateral lean Standing balance support: During functional activity Standing balance-Leahy Scale: Poor                               Pertinent Vitals/Pain Pain Assessment: No/denies pain    Home Living Family/patient expects to be discharged to:: Inpatient rehab                      Prior Function Level of Independence: Independent         Comments: Per wife pt was laid off, but worked as an Engineer, 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     Hand Dominance   Dominant Hand: Right    Extremity/Trunk Assessment   Upper Extremity Assessment: Defer to OT evaluation           Lower Extremity Assessment: Generalized weakness;LLE deficits/detail   LLE Deficits / Details: Overall weakness, but L LE grossly 2-3/5 with question diminished sensation.    Cervical / Trunk Assessment: Normal  Communication   Communication: Prefers language other than English (pt [place] and per old notes speaks  Arabic.)  Cognition Arousal/Alertness: Awake/alert Behavior During Therapy: WFL for tasks assessed/performed Overall Cognitive Status: Difficult to assess                      General Comments      Exercises        Assessment/Plan    PT Assessment Patient needs continued PT services  PT Diagnosis Difficulty walking;Hemiplegia non-dominant side;Generalized weakness   PT Problem List Decreased strength;Decreased activity tolerance;Decreased balance;Decreased mobility;Decreased coordination;Decreased cognition;Decreased knowledge of use of DME;Decreased safety awareness;Impaired sensation  PT Treatment Interventions DME instruction;Gait  training;Functional mobility training;Therapeutic activities;Therapeutic exercise;Balance training;Neuromuscular re-education;Cognitive remediation;Patient/family education   PT Goals (Current goals can be found in the Care Plan section) Acute Rehab PT Goals Patient Stated Goal: Per wife to get back to normal PT Goal Formulation: With patient/family Time For Goal Achievement: 09/20/15 Potential to Achieve Goals: Good    Frequency Min 3X/week   Barriers to discharge        Co-evaluation               End of Session Equipment Utilized During Treatment: Gait belt Activity Tolerance: Patient tolerated treatment well Patient left: in bed;with call bell/phone within reach;with bed alarm set;with family/visitor present Nurse Communication: Mobility status (MD made aware of L sided deficits)         Time: [id number] PT Time Calculation (min) (ACUTE ONLY): 22 min   Charges:   PT Evaluation $PT Eval Moderate Complexity: 1 Procedure     PT G CodesCatarina Hartshorn, [place] 09/06/2015, 12:17 PM

## 2015-09-06 NOTE — Progress Notes (Signed)
Interval History:                                                                                                                      Russell Price is an 55 y.o. male patient with acute encephalopathy secondary to hyperglycemia, intubated was taken off of sedation yesterday morning. Today felt to possibly have left arm weakness.    Past Medical History: Past Medical History  Diagnosis Date  . Diabetes mellitus without complication ([place])     followed by Dr Chalmers Cater  . Hypertension     followed by [facility]  . Chronic kidney disease     Past Surgical History  Procedure Laterality Date  . Bronchoscopy  02/14/2015    for pulm hemorrhage    Family History: Family History  Problem Relation Age of Onset  . Diabetes Mother     Social History:   reports that he quit smoking about 32 years ago. He does not have any smokeless tobacco history on file. He reports that he does not drink alcohol or use illicit drugs.  Allergies:  No Known Allergies   Medications:                                                                                                                         Current facility-administered medications:  .  0.45 % sodium chloride infusion, , Intravenous, Continuous, Lucious Groves, DO, Last Rate: 40 mL/hr at 09/06/15 1231 .  0.9 %  sodium chloride infusion, 250 mL, Intravenous, PRN, Roswell Nickel, MD, Stopped at 09/06/15 1300 .  acetaminophen (TYLENOL) solution 650 mg, 650 mg, Per Tube, Q6H PRN, Collene Gobble, MD, 650 mg at 09/05/15 0535 .  amLODipine (NORVASC) tablet 10 mg, 10 mg, Oral, Daily, Roswell Nickel, MD, 10 mg at 09/04/15 1031 .  antiseptic oral rinse solution (CORINZ), 7 mL, Mouth Rinse, QID, Roswell Nickel, MD, 7 mL at 09/06/15 1209 .  aspirin chewable tablet 81 mg, 81 mg, Oral, Daily, Maryellen Pile, MD, 81 mg at 09/04/15 1114 .  atorvastatin (LIPITOR) tablet 40 mg, 40 mg, Oral, q1800, Roswell Nickel, MD, 40 mg at 09/04/15 1704 .   carvedilol (COREG) tablet 6.25 mg, 6.25 mg, Oral, BID WC, Roswell Nickel, MD, 6.25 mg at 09/05/15 0759 .  chlorhexidine gluconate (SAGE KIT) (PERIDEX) 0.12 % solution 15 mL, 15 mL, Mouth Rinse, BID, Roswell Nickel, MD, 15 mL at 09/06/15 0759 .  famotidine (PEPCID)  tablet 20 mg, 20 mg, Oral, Daily, Priscella Mann, RPH, 20 mg at 09/06/15 1000 .  feeding supplement (VITAL AF 1.2 CAL) liquid 1,000 mL, 1,000 mL, Per Tube, Continuous, Raylene Miyamoto, MD, Stopped at 09/05/15 [phone] .  fentaNYL (SUBLIMAZE) injection 50 mcg, 50 mcg, Intravenous, Q1H PRN, Duffy Bruce, MD, 50 mcg at 09/05/15 0424 .  heparin injection 5,000 Units, 5,000 Units, Subcutaneous, 3 times per day, Roswell Nickel, MD, 5,000 Units at 09/06/15 1411 .  hydrALAZINE (APRESOLINE) injection 10 mg, 10 mg, Intravenous, Q4H PRN, Maryellen Pile, MD, 10 mg at 09/06/15 1226 .  insulin aspart (novoLOG) injection 0-20 Units, 0-20 Units, Subcutaneous, Q4H, Lucious Groves, DO, 4 Units at 09/06/15 1208 .  insulin glargine (LANTUS) injection 20 Units, 20 Units, Subcutaneous, Daily, Lucious Groves, DO, 20 Units at 09/06/15 0915 .  metoCLOPramide (REGLAN) injection 5 mg, 5 mg, Intravenous, TID AC, Lucious Groves, DO .  ondansetron [facility]) injection 4 mg, 4 mg, Intravenous, Q8H PRN, 4 mg at 09/06/15 0508 **OR** ondansetron (ZOFRAN) tablet 4 mg, 4 mg, Oral, Q8H PRN, Francesca Oman, DO   Neurologic Examination:                                                                                                     Today's Vitals   09/06/15 1200 09/06/15 1203 09/06/15 1230 09/06/15 1300  BP: 169/74   150/60  Pulse: 78  77 80  Temp:  98.8 F (37.1 C)    TempSrc:  Oral    Resp: _0 Height:      Weight:      SpO2: 100%  99% 100%    Evaluation of higher integrative functions including: Level of alertness: Alert,  Oriented to hospital Speech: fluent, no evidence of dysarthria or aphasia noted.  Test the following cranial nerves: 2-12  grossly intact Motor examination: Normal tone, bulk, full 5/5 motor strength in all 4 extremities--but seemed to neglect his left arm and leg.  Used right side to do all commands.  Examination of sensation : Normal and symmetric sensation to pinprick in all 4 extremities and on face Examination of deep tendon reflexes: 2+, normal and symmetric in all extremities, normal plantars bilaterally Test coordination: unable to perform   Lab Results: Basic Metabolic Panel:  Recent Labs Lab 09/03/15 1242  09/04/15 0511  09/04/15 1013  09/04/15 2152 09/05/15 0004 09/05/15 0523 09/05/15 0838 09/05/15 1158 09/06/15 0230  NA 137  < > 138  --  137  --  142  --  143  --   --  147*  K 3.3*  < > 3.4*  --  3.2*  --  3.5  --  3.2*  --   --  4.1  CL 109  < > 111  --  109  --  114*  --  111  --   --  114*  CO2 16*  < > 15*  --  16*  --  19*  --  21*  --   --  21*  GLUCOSE  112*  < > 156*  --  241*  --  125*  --  153*  --   --  128*  BUN 63*  < > 52*  --  48*  --  43*  --  46*  --   --  44*  CREATININE 4.55*  < > 4.06*  --  4.14*  --  3.95*  --  4.14*  --   --  3.99*  CALCIUM 8.3*  < > 8.5*  --  8.5*  --  8.4*  --  8.3*  --   --  8.8*  MG 1.9  --   --   --  1.9  --  1.9  --   --  2.0  --  2.1  PHOS 3.2  < > 3.6  < > 3.5  < > 3.2 3.5 2.8 3.3 4.2 4.2  < > = values in this interval not displayed.  Liver Function Tests:  Recent Labs Lab 09/02/15 1630 09/03/15 0036  AST 24 19  ALT 12* 12*  ALKPHOS 125 90  BILITOT 0.5 0.2*  PROT 6.9 6.0*  ALBUMIN 3.5 2.9*   No results for input(s): LIPASE, AMYLASE in the last 168 hours.  Recent Labs Lab 09/02/15 2047  AMMONIA 37*    CBC:  Recent Labs Lab 09/02/15 1844 09/02/15 2053 09/03/15 0036 09/03/15 0721 09/04/15 1013 09/05/15 0523 09/06/15 0230  WBC 23.8* 23.8* 15.0* 12.0* 9.7 8.2 9.0  NEUTROABS 22.0* 21.8*  --   --  7.7  --   --   HGB 12.4* 13.1 9.8* 8.8* 9.6* 8.3* 9.2*  HCT 33.2* 34.5* 25.9* 23.9* 27.3* 23.9* 27.2*  MCV 76.3* 76.2*  74.6* 74.2* 75.8* 77.3* 79.8  PLT 254 195 174 156 146* 129* 135*    Cardiac Enzymes:  Recent Labs Lab 09/02/15 2053 09/03/15 0036 09/03/15 0721 09/03/15 1242  CKTOTAL  --  138  --   --   CKMB  --  11.5*  --   --   TROPONINI 0.13*  --  1.36*  1.22* 0.95*    Lipid Panel:  Recent Labs Lab 09/06/15 0231  TRIG 130    CBG:  Recent Labs Lab 09/06/15 0705 09/06/15 0805 09/06/15 0912 09/06/15 1022 09/06/15 1202  GLUCAP 168* 163* 173* 166* 165*    Microbiology: Results for orders placed or performed during the hospital encounter of 09/02/15  Urine culture     Status: None   Collection Time: 09/02/15  7:18 PM  Result Value Ref Range Status   Specimen Description URINE, CATHETERIZED  Final   Special Requests Normal  Final   Culture NO GROWTH 2 DAYS  Final   Report Status 09/04/2015 FINAL  Final  Blood culture (routine x 2)     Status: None (Preliminary result)   Collection Time: 09/02/15  7:53 PM  Result Value Ref Range Status   Specimen Description BLOOD RIGHT HAND  Final   Special Requests IN PEDIATRIC BOTTLE 1CC  Final   Culture NO GROWTH 4 DAYS  Final   Report Status PENDING  Incomplete  Blood culture (routine x 2)     Status: None (Preliminary result)   Collection Time: 09/02/15  7:55 PM  Result Value Ref Range Status   Specimen Description BLOOD LEFT HAND  Final   Special Requests IN PEDIATRIC BOTTLE 1CC  Final   Culture NO GROWTH 4 DAYS  Final   Report Status PENDING  Incomplete  MRSA PCR Screening     Status: None  Collection Time: 09/02/15  9:22 PM  Result Value Ref Range Status   MRSA by PCR NEGATIVE NEGATIVE Final    Comment:        The GeneXpert MRSA Assay (FDA approved for NASAL specimens only), is one component of a comprehensive MRSA colonization surveillance program. It is not intended to diagnose MRSA infection nor to guide or monitor treatment for MRSA infections.   Culture, blood (routine x 2)     Status: None (Preliminary result)    Collection Time: 09/05/15  5:20 AM  Result Value Ref Range Status   Specimen Description BLOOD LEFT HAND  Final   Special Requests IN PEDIATRIC BOTTLE 2CC  Final   Culture NO GROWTH 1 DAY  Final   Report Status PENDING  Incomplete  Culture, blood (routine x 2)     Status: None (Preliminary result)   Collection Time: 09/05/15  5:28 AM  Result Value Ref Range Status   Specimen Description BLOOD LEFT ARM  Final   Special Requests IN PEDIATRIC BOTTLE 1CC  Final   Culture NO GROWTH 1 DAY  Final   Report Status PENDING  Incomplete    Imaging: Ct Head Wo Contrast  09/02/2015  CLINICAL DATA:  Patient found by family on the floor. Recent seizure. EXAM: CT HEAD WITHOUT CONTRAST CT CERVICAL SPINE WITHOUT CONTRAST TECHNIQUE: Multidetector CT imaging of the head and cervical spine was performed following the standard protocol without intravenous contrast. Multiplanar CT image reconstructions of the cervical spine were also generated. COMPARISON:  MRI brain 02/18/2015 FINDINGS: CT HEAD FINDINGS Ventricles and sulci are appropriate for patient's age. No evidence for acute cortically based infarct, intracranial hemorrhage, mass lesion or mass-effect. Orbits are unremarkable. Small amount of fluid within the left maxillary sinus. Remainder of the paranasal sinuses are unremarkable. Mastoid air cells are well aerated. Calvarium is intact. CT CERVICAL SPINE FINDINGS Normal anatomic alignment. No evidence for acute fracture or dislocation. Preservation of the vertebral body and intervertebral disc space heights. Craniocervical junction is intact. Lung apices are unremarkable. ET tube terminates in the distal trachea visualized thyroid is unremarkable. IMPRESSION: No acute intracranial process. No acute cervical spine fracture. Electronically Signed   By: Lovey Newcomer M.D.   On: 09/02/2015 17:24   Ct Cervical Spine Wo Contrast  09/02/2015  CLINICAL DATA:  Patient found by family on the floor. Recent seizure. EXAM: CT  HEAD WITHOUT CONTRAST CT CERVICAL SPINE WITHOUT CONTRAST TECHNIQUE: Multidetector CT imaging of the head and cervical spine was performed following the standard protocol without intravenous contrast. Multiplanar CT image reconstructions of the cervical spine were also generated. COMPARISON:  MRI brain 02/18/2015 FINDINGS: CT HEAD FINDINGS Ventricles and sulci are appropriate for patient's age. No evidence for acute cortically based infarct, intracranial hemorrhage, mass lesion or mass-effect. Orbits are unremarkable. Small amount of fluid within the left maxillary sinus. Remainder of the paranasal sinuses are unremarkable. Mastoid air cells are well aerated. Calvarium is intact. CT CERVICAL SPINE FINDINGS Normal anatomic alignment. No evidence for acute fracture or dislocation. Preservation of the vertebral body and intervertebral disc space heights. Craniocervical junction is intact. Lung apices are unremarkable. ET tube terminates in the distal trachea visualized thyroid is unremarkable. IMPRESSION: No acute intracranial process. No acute cervical spine fracture. Electronically Signed   By: Lovey Newcomer M.D.   On: 09/02/2015 17:24   Mr Brain Wo Contrast  09/02/2015  CLINICAL DATA:  Code stroke. Unresponsive. Evaluate for basilar thrombosis. EXAM: MRA HEAD WITHOUT CONTRAST TECHNIQUE: Angiographic images of  the Circle of Willis were obtained using MRA technique without intravenous contrast. COMPARISON:  Head CT from earlier today.  MRI 02/18/2015 FINDINGS: Axial diffusion was performed and is negative for acute infarct. Symmetric carotid arteries at the upper cervical segment. Extensive atherosclerotic irregularity of the bilateral siphons consistent with atheromatous disease. There is a new flow gap at the anterior genu of the right ICA with susceptibility artifact that does not correlate with metal, stent, or gas on prior head CT. There is also poor signal at the left carotid terminus extending into the A1  segment. Beyond these apparent stenoses, there is robust and symmetric flow related signal. Dominant right vertebral artery. Extensive atherosclerotic irregularity of the left V4 segment, likely occluded distally on a chronic basis .There is atheromatous narrowing of the left proximal PICA. Atheromatous narrowing of the right P1 P2 segment that is mild. No major vessel occlusion in the vertebrobasilar distribution. IMPRESSION: 1. Negative for basilar thrombosis.  No acute infarct. 2. Apparent high-grade stenosis at the left carotid terminus and right cavernous carotid are suspicious for artifact given rapid development since 02/18/2015 study and associated susceptibility artifact. Consider follow-up or CTA depending on clinical circumstances. 3. 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 Electronically Signed   By: Monte Fantasia M.D.   On: 09/02/2015 18:35   Dg Chest Port 1 View  09/05/2015  CLINICAL DATA:  Acute respiratory failure EXAM: PORTABLE CHEST 1 VIEW COMPARISON:  09/04/2015 FINDINGS: Support devices are stable. Lungs are clear. Heart is normal size. No effusions. IMPRESSION: No active disease. Electronically Signed   By: Rolm Baptise M.D.   On: 09/05/2015 08:08   Dg Chest Port 1 View  09/04/2015  CLINICAL DATA:  Acute respiratory failure, intubated patient, diabetic ketoacidosis, acute and chronic renal failure. EXAM: PORTABLE CHEST 1 VIEW COMPARISON:  Portable chest x-ray of September 02, 2015 FINDINGS: The lungs are well-expanded. There is no focal infiltrate. There is no pleural effusion or pneumothorax. The heart and pulmonary vascularity are normal. The mediastinum is normal in width. The endotracheal tube tip lies approximately 6.3 cm above the carina. The esophagogastric tube tip projects below the inferior margin of the image. IMPRESSION: Interval improvement in the appearance of the right lung base with resolution of subsegmental  atelectasis or infiltrate. There is no evidence of pneumonia nor pulmonary edema. No pleural effusions are evident. The support tubes are in stable position. Electronically Signed   By: David  Martinique M.D.   On: 09/04/2015 07:23   Dg Chest Port 1 View  09/02/2015  CLINICAL DATA:  Patient status post ET tube placement. EXAM: PORTABLE CHEST 1 VIEW COMPARISON:  Chest radiograph 02/20/2015 FINDINGS: ET tube terminates in the distal trachea. Monitoring leads overlie the patient. Stable enlarged cardiac and mediastinal contours. Elevation right hemidiaphragm. Heterogeneous opacities right lung base. No pleural effusion or pneumothorax. IMPRESSION: ET tube terminates in the distal trachea. Elevation right hemidiaphragm. Heterogeneous opacities right lung base may represent atelectasis, aspiration or infection. Electronically Signed   By: Lovey Newcomer M.D.   On: 09/02/2015 17:33   Dg Abd Portable 1v  09/02/2015  CLINICAL DATA:  Enteric tube placed EXAM: PORTABLE ABDOMEN - 1 VIEW COMPARISON:  02/14/2015 abdominal radiograph FINDINGS: Enteric tube terminates in the distal stomach. Mild gaseous distention of the stomach. No dilated small bowel loops in the visualized upper abdomen. No evidence of pneumatosis or pneumoperitoneum. IMPRESSION: Enteric tube terminates in the distal stomach. Electronically Signed  By: Ilona Sorrel M.D.   On: 09/02/2015 22:17    Assessment and plan:   Russell Price is an 55 y.o. male patient with acute encephalopathy secondary to severe hyperglycemia, off of sedation, more awake and today noted to have left arm weakness. Exam shows more of a neglect than weakness.  Cannot rule out a right brain infarct causing left arm and leg neglect. Will order a MRI brain.  If positive will put on stroke team and further work up. If negative would look further into encephalopathic etiology.

## 2015-09-06 NOTE — Evaluation (Signed)
Clinical/Bedside Swallow Evaluation Patient Details  Name: Russell Price MRN: [id number] Date of Birth: [birth year]  Today's Date: 09/06/2015 Time: SLP Start Time (ACUTE ONLY): 1435 SLP Stop Time (ACUTE ONLY): 1449 SLP Time Calculation (min) (ACUTE ONLY): 14 min  Past Medical History:  Past Medical History  Diagnosis Date  . Diabetes mellitus without complication ([place])     followed by Dr Chalmers Cater  . Hypertension     followed by [facility]  . Chronic kidney disease    Past Surgical History:  Past Surgical History  Procedure Laterality Date  . Bronchoscopy  02/14/2015    for pulm hemorrhage   HPI:  pt presents after being found down with DKA, Encephalopathy, and Seizure activity. pt was intubated 4/15 - 4/18. pt with hx of CKD, DM, and R eye blindness.Prior swallow evaluation in 02/2015 recommended Dys 3 diet and thin liquids with advancement to regular consistencies prior to d/c.   Assessment / Plan / Recommendation Clinical Impression  Pt's hyolaryngeal movement appears reduced upon palpation, but no overt s/s of aspiration are noted and vocal quality remains clear. He does seem to favor his right side during mastication, with lingual and right-sided residue noted after trials of regular textures, which cleared with Min cues from SLP for lingual sweep/repeat swallow. Recommend Dys 3 diet and thin liquids with SLP f/u for tolerance.    Aspiration Risk  Mild aspiration risk    Diet Recommendation Dysphagia 3 (Mech soft);Thin liquid   Liquid Administration via: Cup;Straw Medication Administration: Whole meds with puree Supervision: Patient able to self feed;Full supervision/cueing for compensatory strategies Compensations: Slow rate;Small sips/bites;Lingual sweep for clearance of pocketing Postural Changes: Seated upright at 90 degrees    Other  Recommendations Oral Care Recommendations: Oral care BID   Follow up Recommendations   (tba)    Frequency and  Duration min 2x/week  1 week       Prognosis Prognosis for Safe Diet Advancement: Good      Swallow Study   General HPI: pt presents after being found down with DKA, Encephalopathy, and Seizure activity. pt was intubated 4/15 - 4/18. pt with hx of CKD, DM, and R eye blindness.Prior swallow evaluation in 02/2015 recommended Dys 3 diet and thin liquids with advancement to regular consistencies prior to d/c. Type of Study: Bedside Swallow Evaluation Previous Swallow Assessment: see HPI Diet Prior to this Study: NPO Temperature Spikes Noted: Yes (99.7) Respiratory Status: Room air History of Recent Intubation: Yes Length of Intubations (days): 4 days Date extubated: 09/05/15 Behavior/Cognition: Alert;Cooperative;Requires cueing Oral Cavity Assessment: Within Functional Limits Oral Care Completed by SLP: Yes Oral Cavity - Dentition: Adequate natural dentition Vision: Functional for self-feeding Self-Feeding Abilities: Able to feed self;Needs assist Patient Positioning: Upright in bed Baseline Vocal Quality: Low vocal intensity Volitional Cough: Strong Volitional Swallow: Unable to elicit    Oral/Motor/Sensory Function Overall Oral Motor/Sensory Function: Within functional limits   Ice Chips Ice chips: Impaired Presentation: Spoon Pharyngeal Phase Impairments: Decreased hyoid-laryngeal movement   Thin Liquid Thin Liquid: Impaired Presentation: Self Fed;Cup;Straw;Spoon Pharyngeal  Phase Impairments: Decreased hyoid-laryngeal movement    Nectar Thick Nectar Thick Liquid: Not tested   Honey Thick Honey Thick Liquid: Not tested   Puree Puree: Impaired Presentation: Spoon Pharyngeal Phase Impairments: Decreased hyoid-laryngeal movement   Solid   GO   Solid: Impaired Oral Phase Functional Implications: Impaired mastication;Oral residue Pharyngeal Phase Impairments: Decreased hyoid-laryngeal movement       Germain Osgood, M.A. CCC-SLP [phone]  Germain Osgood  09/06/2015,2:57 PM

## 2015-09-06 NOTE — Progress Notes (Signed)
Attempted to call report to 6N. Report refused. Will try again. RN will continue to monitor patient.

## 2015-09-06 NOTE — Progress Notes (Signed)
Russell Price is a 55 y.o. male patient admitted from 91M awake, alert - oriented  X 4 - no acute distress noted.  VSS - Blood pressure 172/68, pulse 78, temperature 98.9 F (37.2 C), temperature source Oral, resp. rate 17, height 5\' 10"  (1.778 m), weight 72.576 kg (160 lb), SpO2 99 %.    IV in place, occlusive dsg intact without redness.  Orientation to room, and floor completed, given to patient/family. Admission INP armband ID verified with patient/family, and in place.   SR up x 2, fall assessment complete, with patient and family able to verbalize understanding of risk associated with falls, and verbalized understanding to call nsg before up out of bed.  Call light within reach, patient able to voice, and demonstrate understanding.  Skin, clean-dry with abrasion on the forehead.        Will cont to eval and treat per MD orders.  Vidal Schwalbe, RN 09/06/2015 9:09 PM

## 2015-09-06 NOTE — Progress Notes (Addendum)
Rehab Admissions Coordinator Note:  Patient was screened by Cleatrice Burke for appropriateness for an Inpatient Acute Rehab Consult per PT recommednation.  At this time, we are recommending Inpatient Rehab consult. Please place order when medially appropriate. OT is also recommended per PT.  Cleatrice Burke 09/06/2015, 1:10 PM  I can be reached at [phone].

## 2015-09-07 ENCOUNTER — Inpatient Hospital Stay (HOSPITAL_COMMUNITY): Payer: Medicaid Other

## 2015-09-07 DIAGNOSIS — R5381 Other malaise: Secondary | ICD-10-CM

## 2015-09-07 DIAGNOSIS — G9341 Metabolic encephalopathy: Secondary | ICD-10-CM

## 2015-09-07 LAB — BASIC METABOLIC PANEL
Anion gap: 15 (ref 5–15)
BUN: 38 mg/dL — AB (ref 6–20)
CALCIUM: 9 mg/dL (ref 8.9–10.3)
CO2: 21 mmol/L — ABNORMAL LOW (ref 22–32)
CREATININE: 3.71 mg/dL — AB (ref 0.61–1.24)
Chloride: 115 mmol/L — ABNORMAL HIGH (ref 101–111)
GFR calc Af Amer: 19 mL/min — ABNORMAL LOW (ref >60)
GFR, EST NON AFRICAN AMERICAN: 17 mL/min — AB (ref >60)
GLUCOSE: 175 mg/dL — AB (ref 65–99)
POTASSIUM: 4.1 mmol/L (ref 3.5–5.1)
SODIUM: 151 mmol/L — AB (ref 135–145)

## 2015-09-07 LAB — CULTURE, BLOOD (ROUTINE X 2)
CULTURE: NO GROWTH
Culture: NO GROWTH

## 2015-09-07 LAB — GLUCOSE, CAPILLARY
GLUCOSE-CAPILLARY: 154 mg/dL — AB (ref 65–99)
GLUCOSE-CAPILLARY: 225 mg/dL — AB (ref 65–99)
Glucose-Capillary: 131 mg/dL — ABNORMAL HIGH (ref 65–99)
Glucose-Capillary: 292 mg/dL — ABNORMAL HIGH (ref 65–99)
Glucose-Capillary: 234 mg/dL — ABNORMAL HIGH (ref 65–99)

## 2015-09-07 MED ORDER — FLUCONAZOLE 100 MG PO TABS
100.0000 mg | ORAL_TABLET | Freq: Every day | ORAL | Status: DC
  Administered 2015-09-07 – 2015-09-08 (×2): 100 mg via ORAL
  Filled 2015-09-07 (×2): qty 1

## 2015-09-07 NOTE — Progress Notes (Signed)
Interval History:                                                                                                                      Russell Price is an 55 y.o. male patient with left sided weakness which has fully resolved and MRI negative.    Past Medical History: Past Medical History  Diagnosis Date  . Diabetes mellitus without complication ([place])     followed by Dr Chalmers Cater  . Hypertension     followed by [facility]  . Chronic kidney disease     Past Surgical History  Procedure Laterality Date  . Bronchoscopy  02/14/2015    for pulm hemorrhage    Family History: Family History  Problem Relation Age of Onset  . Diabetes Mother     Social History:   reports that he quit smoking about 32 years ago. He does not have any smokeless tobacco history on file. He reports that he does not drink alcohol or use illicit drugs.  Allergies:  No Known Allergies   Medications:                                                                                                                         Current facility-administered medications:  .  0.45 % sodium chloride infusion, , Intravenous, Continuous, Lucious Groves, DO, Last Rate: 40 mL/hr at 09/07/15 0526 .  0.9 %  sodium chloride infusion, 250 mL, Intravenous, PRN, Roswell Nickel, MD, Stopped at 09/06/15 1300 .  acetaminophen (TYLENOL) solution 650 mg, 650 mg, Per Tube, Q6H PRN, Collene Gobble, MD, 650 mg at 09/05/15 0535 .  amLODipine (NORVASC) tablet 10 mg, 10 mg, Oral, Daily, Roswell Nickel, MD, 10 mg at 09/04/15 1031 .  aspirin chewable tablet 81 mg, 81 mg, Oral, Daily, Maryellen Pile, MD, 81 mg at 09/04/15 1114 .  atorvastatin (LIPITOR) tablet 40 mg, 40 mg, Oral, q1800, Roswell Nickel, MD, 40 mg at 09/06/15 1700 .  carvedilol (COREG) tablet 6.25 mg, 6.25 mg, Oral, BID WC, Roswell Nickel, MD, 6.25 mg at 09/07/15 0818 .  famotidine (PEPCID) tablet 20 mg, 20 mg, Oral, Daily, Priscella Mann, RPH, 20 mg at 09/06/15  1000 .  feeding supplement (VITAL AF 1.2 CAL) liquid 1,000 mL, 1,000 mL, Per Tube, Continuous, Raylene Miyamoto, MD, Stopped at 09/05/15 [phone] .  fentaNYL (SUBLIMAZE) injection 50 mcg, 50 mcg, Intravenous, Q1H PRN, Duffy Bruce, MD, 50 mcg at  09/05/15 0424 .  fluconazole (DIFLUCAN) tablet 100 mg, 100 mg, Oral, Daily, Oswald Hillock, MD .  heparin injection 5,000 Units, 5,000 Units, Subcutaneous, 3 times per day, Roswell Nickel, MD, 5,000 Units at 09/07/15 0528 .  hydrALAZINE (APRESOLINE) injection 10 mg, 10 mg, Intravenous, Q4H PRN, Maryellen Pile, MD, 10 mg at 09/07/15 [id number] .  insulin aspart (novoLOG) injection 0-20 Units, 0-20 Units, Subcutaneous, Q4H, Lucious Groves, DO, 3 Units at 09/07/15 [phone] .  insulin glargine (LANTUS) injection 20 Units, 20 Units, Subcutaneous, Daily, Lucious Groves, DO, 20 Units at 09/06/15 0915 .  metoCLOPramide (REGLAN) injection 5 mg, 5 mg, Intravenous, TID AC, Lucious Groves, DO, 5 mg at 09/07/15 0817 .  ondansetron (ZOFRAN) injection 4 mg, 4 mg, Intravenous, Q8H PRN, 4 mg at 09/06/15 0508 **OR** ondansetron (ZOFRAN) tablet 4 mg, 4 mg, Oral, Q8H PRN, Francesca Oman, DO   Neurologic Examination:                                                                                                     Today's Vitals   09/06/15 2100 09/06/15 2108 09/07/15 0115 09/07/15 0500  BP:  172/68 159/65 169/66  Pulse:  78 70 84  Temp:  98.9 F (37.2 C) 98.8 F (37.1 C) 98.9 F (37.2 C)  TempSrc:  Oral    Resp:  17 18 18   Height: 5\' 10"  (1.778 m)     Weight: 72.576 kg (160 lb)     SpO2:  99% 98% 100%    Evaluation of higher integrative functions including: Level of alertness: Alert,  Oriented to time, place and person Speech: fluent, no evidence of dysarthria or aphasia noted.  Test the following cranial nerves: 2-12 grossly intact Motor examination: Normal tone, bulk, full 5/5 motor strength in all 4 extremities Examination of sensation : Normal and symmetric sensation  to pinprick in all 4 extremities and on face Examination of deep tendon reflexes: 2+, normal and symmetric in all extremities, normal plantars bilaterally    Lab Results: Basic Metabolic Panel:  Recent Labs Lab 09/03/15 1242  09/04/15 1013  09/04/15 2152 09/05/15 0004 09/05/15 0523 09/05/15 0838 09/05/15 1158 09/06/15 0230 09/07/15 0348  NA 137  < > 137  --  142  --  143  --   --  147* 151*  K 3.3*  < > 3.2*  --  3.5  --  3.2*  --   --  4.1 4.1  CL 109  < > 109  --  114*  --  111  --   --  114* 115*  CO2 16*  < > 16*  --  19*  --  21*  --   --  21* 21*  GLUCOSE 112*  < > 241*  --  125*  --  153*  --   --  128* 175*  BUN 63*  < > 48*  --  43*  --  46*  --   --  44* 38*  CREATININE 4.55*  < > 4.14*  --  3.95*  --  4.14*  --   --  3.99* 3.71*  CALCIUM 8.3*  < > 8.5*  --  8.4*  --  8.3*  --   --  8.8* 9.0  MG 1.9  --  1.9  --  1.9  --   --  2.0  --  2.1  --   PHOS 3.2  < > 3.5  < > 3.2 3.5 2.8 3.3 4.2 4.2  --   < > = values in this interval not displayed.  Liver Function Tests:  Recent Labs Lab 09/02/15 1630 09/03/15 0036  AST 24 19  ALT 12* 12*  ALKPHOS 125 90  BILITOT 0.5 0.2*  PROT 6.9 6.0*  ALBUMIN 3.5 2.9*   No results for input(s): LIPASE, AMYLASE in the last 168 hours.  Recent Labs Lab 09/02/15 2047  AMMONIA 37*    CBC:  Recent Labs Lab 09/02/15 1844 09/02/15 2053 09/03/15 0036 09/03/15 0721 09/04/15 1013 09/05/15 0523 09/06/15 0230  WBC 23.8* 23.8* 15.0* 12.0* 9.7 8.2 9.0  NEUTROABS 22.0* 21.8*  --   --  7.7  --   --   HGB 12.4* 13.1 9.8* 8.8* 9.6* 8.3* 9.2*  HCT 33.2* 34.5* 25.9* 23.9* 27.3* 23.9* 27.2*  MCV 76.3* 76.2* 74.6* 74.2* 75.8* 77.3* 79.8  PLT 254 195 174 156 146* 129* 135*    Cardiac Enzymes:  Recent Labs Lab 09/02/15 2053 09/03/15 0036 09/03/15 0721 09/03/15 1242  CKTOTAL  --  138  --   --   CKMB  --  11.5*  --   --   TROPONINI 0.13*  --  1.36*  1.22* 0.95*    Lipid Panel:  Recent Labs Lab 09/06/15 0231  TRIG  130    CBG:  Recent Labs Lab 09/06/15 1202 09/06/15 1552 09/06/15 2339 09/07/15 0400 09/07/15 0816  GLUCAP 165* 189* 198* 154* 131*    Microbiology: Results for orders placed or performed during the hospital encounter of 09/02/15  Urine culture     Status: None   Collection Time: 09/02/15  7:18 PM  Result Value Ref Range Status   Specimen Description URINE, CATHETERIZED  Final   Special Requests Normal  Final   Culture NO GROWTH 2 DAYS  Final   Report Status 09/04/2015 FINAL  Final  Blood culture (routine x 2)     Status: None (Preliminary result)   Collection Time: 09/02/15  7:53 PM  Result Value Ref Range Status   Specimen Description BLOOD RIGHT HAND  Final   Special Requests IN PEDIATRIC BOTTLE 1CC  Final   Culture NO GROWTH 4 DAYS  Final   Report Status PENDING  Incomplete  Blood culture (routine x 2)     Status: None (Preliminary result)   Collection Time: 09/02/15  7:55 PM  Result Value Ref Range Status   Specimen Description BLOOD LEFT HAND  Final   Special Requests IN PEDIATRIC BOTTLE 1CC  Final   Culture NO GROWTH 4 DAYS  Final   Report Status PENDING  Incomplete  MRSA PCR Screening     Status: None   Collection Time: 09/02/15  9:22 PM  Result Value Ref Range Status   MRSA by PCR NEGATIVE NEGATIVE Final    Comment:        The GeneXpert MRSA Assay (FDA approved for NASAL specimens only), is one component of a comprehensive MRSA colonization surveillance program. It is not intended to diagnose MRSA infection nor to guide or monitor treatment for MRSA infections.   Culture, blood (  routine x 2)     Status: None (Preliminary result)   Collection Time: 09/05/15  5:20 AM  Result Value Ref Range Status   Specimen Description BLOOD LEFT HAND  Final   Special Requests IN PEDIATRIC BOTTLE 2CC  Final   Culture NO GROWTH 1 DAY  Final   Report Status PENDING  Incomplete  Culture, blood (routine x 2)     Status: None (Preliminary result)   Collection Time:  09/05/15  5:28 AM  Result Value Ref Range Status   Specimen Description BLOOD LEFT ARM  Final   Special Requests IN PEDIATRIC BOTTLE 1CC  Final   Culture NO GROWTH 1 DAY  Final   Report Status PENDING  Incomplete    Imaging: Mr Brain Wo Contrast  09/07/2015  CLINICAL DATA:  Hyperglycemia and acute encephalopathy. LEFT arm weakness today after discontinuing sedation yesterday. History of diabetes and hypertension. EXAM: MRI HEAD WITHOUT CONTRAST TECHNIQUE: Multiplanar, multiecho pulse sequences of the brain and surrounding structures were obtained without intravenous contrast. COMPARISON:  MRI/MRA of the head September 02, 2015 FINDINGS: INTRACRANIAL CONTENTS: No reduced diffusion to suggest acute ischemia. Scratch The ventricles and sulci are normal for patient's age. Old RIGHT thalamus infarct. Old LEFT globus pallidus lacunar infarct. LEFT periatrial probable developmental venous anomaly/perivascular space. No suspicious parenchymal signal, mass lesions, mass effect. Again noted is susceptibility artifact within the vermis which could represent cavernoma though is nonspecific. No abnormal extra-axial fluid collections. No extra-axial masses though, contrast enhanced sequences would be more sensitive. Normal major intracranial vascular flow voids present at skull base. ORBITS: The included ocular globes and orbital contents are non-suspicious. Status post RIGHT ocular lens implant. SINUSES: Soft tissue opacifies the sphenoid sinuses with reduced diffusion. Trace mastoid effusions. SKULL/SOFT TISSUES: No abnormal sellar expansion. No suspicious calvarial bone marrow signal. Craniocervical junction maintained. IMPRESSION: No acute intracranial process, specifically no acute ischemia. 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 New sphenoid sinusitis, possible fungal component. Electronically Signed   By: Elon Alas M.D.   On: 09/07/2015 02:38   Dg Chest Port 1 View  09/05/2015  CLINICAL DATA:   Acute respiratory failure EXAM: PORTABLE CHEST 1 VIEW COMPARISON:  09/04/2015 FINDINGS: Support devices are stable. Lungs are clear. Heart is normal size. No effusions. IMPRESSION: No active disease. Electronically Signed   By: Rolm Baptise M.D.   On: 09/05/2015 08:08   Dg Chest Port 1 View  09/04/2015  CLINICAL DATA:  Acute respiratory failure, intubated patient, diabetic ketoacidosis, acute and chronic renal failure. EXAM: PORTABLE CHEST 1 VIEW COMPARISON:  Portable chest x-ray of September 02, 2015 FINDINGS: The lungs are well-expanded. There is no focal infiltrate. There is no pleural effusion or pneumothorax. The heart and pulmonary vascularity are normal. The mediastinum is normal in width. The endotracheal tube tip lies approximately 6.3 cm above the carina. The esophagogastric tube tip projects below the inferior margin of the image. IMPRESSION: Interval improvement in the appearance of the right lung base with resolution of subsegmental atelectasis or infiltrate. There is no evidence of pneumonia nor pulmonary edema. No pleural effusions are evident. The support tubes are in stable position. Electronically Signed   By: David  Martinique M.D.   On: 09/04/2015 07:23    Assessment and plan:   Russell Price is an 55 y.o. male patient with encephalopathy and left arm weakness after extubation.  Arm weakness resolved and MRI negative for stroke. Due to complete resolution of symptoms no further neurodiagnostics needed. Neurology will  sign off. Call if needed.

## 2015-09-07 NOTE — Consult Note (Signed)
Physical Medicine and Rehabilitation Consult Reason for Consult: Acute encephalopathy/severe DKA Referring Physician: [facility]   HPI: Russell Price is a 55 y.o. right handed  male with history of chronic renal insufficiency with baseline creatinine 3.95, uncontrolled diabetes mellitus, hypertension, right eye blindness. By chart review patient is married and independent prior to admission. 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 Presented 09/02/2015 with increasing fatigue and weakness over the past few days and later found unresponsive by wife. Noted 2 lacerations on his head. There was concern for possible seizure. He was intubated for airway protection. Cranial CT scan cervical spine films negative. MRI of the brain negative. 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 Patient did not receive TPA. Echocardiogram with ejection fraction of 60% no wall motion abnormalities without PFO. EEG to generalized background slowing indicating of diffuse cerebral dysfunction. No seizure activity. Patient was extubated 09/05/2015. Subcutaneous heparin for DVT prophylaxis. Currently on a mechanical soft diet. Physical therapy evaluation completed 09/06/2015 with recommendations of physical medicine rehabilitation consult.  Wife is at bedside. She does more state of the talking. Her English is fair. Apparently he was working until last September when he was laid off. He has been independent with all his self-care and mobility and was still driving a car up until a couple days prior to admission Review of Systems  Constitutional: Positive for malaise/fatigue. Negative for fever and chills.  HENT: Negative for hearing loss.   Eyes:       Blindness to right eye  Respiratory: Negative for cough and  shortness of breath.   Cardiovascular: Negative for palpitations and leg swelling.  Gastrointestinal: Positive for constipation. Negative for nausea and vomiting.  Genitourinary: Negative for dysuria and hematuria.  Musculoskeletal: Positive for myalgias.  Neurological: Positive for focal weakness, weakness and headaches.  All other systems reviewed and are negative.  Past Medical History  Diagnosis Date  . Diabetes mellitus without complication ([place])     followed by Dr Chalmers Cater  . Hypertension     followed by [facility]  . Chronic kidney disease    Past Surgical History  Procedure Laterality Date  . Bronchoscopy  02/14/2015    for pulm hemorrhage   Family History  Problem Relation Age of Onset  . Diabetes Mother    Social History:  reports that he quit smoking about 32 years ago. He does not have any smokeless tobacco history on file. He reports that he does not drink alcohol or use illicit drugs. Allergies: No Known Allergies Facility-administered medications prior to admission  Medication Dose Route Frequency Provider Last Rate Last Dose  . insulin aspart (novoLOG) injection 20 Units  20 Units Subcutaneous Once Posey Boyer, MD       Medications Prior to Admission  Medication Sig Dispense Refill  . amLODipine (NORVASC) 10 MG tablet Take 1 tablet (10 mg total) by mouth daily. Stop lisinopril . 30 tablet 11  . aspirin EC 325 MG tablet Take 1 tablet (325 mg total) by mouth daily. 30 tablet 0  . atorvastatin (LIPITOR) 40 MG tablet Take 1 tablet (40 mg total) by mouth daily at 6 PM. 30 tablet 11  . carvedilol (COREG) 6.25 MG tablet Take 1 tablet (6.25 mg total) by mouth 2 (two) times daily with a  meal. 90 tablet 11  . glipiZIDE (GLUCOTROL) 5 MG tablet Take 2 pills in the morning and one in the evening for diabetes 90 tablet 11  . Insulin Glargine (LANTUS SOLOSTAR) 100 UNIT/ML Solostar Pen Inject 60 units subcutaneously at bedtime. 15 mL 12    Home: Home  Living Family/patient expects to be discharged to:: Inpatient rehab Living Arrangements: Spouse/significant other, Other relatives  Functional History: Prior Function Level of Independence: Independent Comments: Per wife pt was laid off, but worked as an Engineer, 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 Functional Status:  Mobility: Bed Mobility Overal bed mobility: Needs Assistance, +2 for physical assistance Bed Mobility: Supine to Sit, Sit to Supine Supine to sit: Mod assist, +2 for physical assistance Sit to supine: Mod assist, +2 for physical assistance General bed mobility comments: pt needs A for Bil LEs and bringing trunk up to sitting.  He does attempt to A with mobility.   Transfers Overall transfer level: Needs assistance Equipment used: 2 person hand held assist Transfers: Sit to/from Stand Sit to Stand: Max assist, +2 physical assistance General transfer comment: pt needs LEs placed on floor and L LE blocked.  L knee hyperextends in standing and attempts at weightbearing require L knee to be blocked to prevent buckling.        ADL:    Cognition: Cognition Overall Cognitive Status: Difficult to assess Orientation Level: Oriented X4 Cognition Arousal/Alertness: Awake/alert Behavior During Therapy: WFL for tasks assessed/performed Overall Cognitive Status: Difficult to assess Difficult to assess due to: Non-English speaking (Answers some questions, but very flat.)  Blood pressure 169/66, pulse 84, temperature 98.9 F (37.2 C), temperature source Oral, resp. rate 18, height 5\' 10"  (1.778 m), weight 72.576 kg (160 lb), SpO2 100 %. Physical Exam  Constitutional: He is oriented to person, place, and time. He appears well-developed.  HENT:  Head: Normocephalic.  Eyes: EOM are normal.  Neck: Normal range of motion. Neck supple. No thyromegaly present.  Cardiovascular: Normal rate and regular rhythm.   Respiratory: Effort normal and breath sounds normal. No  respiratory distress.  GI: Soft. Bowel sounds are normal. He exhibits no distension.  Neurological: He is alert and oriented to person, place, and time.  Mood is flat but appropriate. He answers basic questions appropriately. Follows simple commands  Skin: Skin is warm and dry.  Motor strength is 4/5 bilateral deltoid, biceps, triceps, grip, hip flexor, knee extensor, ankle dorsiflexor Sensation difficult to assess secondary to his [place] language skills. He does report ability to sense in all 4 limbs. No swelling in the lower extremities. He does have mild dorsal hand edema in the left upper extremity.  Results for orders placed or performed during the hospital encounter of 09/02/15 (from the past 24 hour(s))  Glucose, capillary     Status: Abnormal   Collection Time: 09/06/15 10:22 AM  Result Value Ref Range   Glucose-Capillary 166 (H) 65 - 99 mg/dL  Glucose, capillary     Status: Abnormal   Collection Time: 09/06/15 12:02 PM  Result Value Ref Range   Glucose-Capillary 165 (H) 65 - 99 mg/dL  Glucose, capillary     Status: Abnormal   Collection Time: 09/06/15  3:52 PM  Result Value Ref Range   Glucose-Capillary 189 (H) 65 - 99 mg/dL  Glucose, capillary     Status: Abnormal   Collection Time: 09/06/15 11:39 PM  Result Value Ref Range   Glucose-Capillary 198 (H) 65 - 99 mg/dL  Basic metabolic panel  Status: Abnormal   Collection Time: 09/07/15  3:48 AM  Result Value Ref Range   Sodium 151 (H) 135 - 145 mmol/L   Potassium 4.1 3.5 - 5.1 mmol/L   Chloride 115 (H) 101 - 111 mmol/L   CO2 21 (L) 22 - 32 mmol/L   Glucose, Bld 175 (H) 65 - 99 mg/dL   BUN 38 (H) 6 - 20 mg/dL   Creatinine, Ser 3.71 (H) 0.61 - 1.24 mg/dL   Calcium 9.0 8.9 - 10.3 mg/dL   GFR calc non Af Amer 17 (L) >60 mL/min   GFR calc Af Amer 19 (L) >60 mL/min   Anion gap 15 5 - 15  Glucose, capillary     Status: Abnormal   Collection Time: 09/07/15  4:00 AM  Result Value Ref Range   Glucose-Capillary 154 (H) 65 -  99 mg/dL  Glucose, capillary     Status: Abnormal   Collection Time: 09/07/15  8:16 AM  Result Value Ref Range   Glucose-Capillary 131 (H) 65 - 99 mg/dL   Mr Brain Wo Contrast  09/07/2015  CLINICAL DATA:  Hyperglycemia and acute encephalopathy. LEFT arm weakness today after discontinuing sedation yesterday. History of diabetes and hypertension. EXAM: MRI HEAD WITHOUT CONTRAST TECHNIQUE: Multiplanar, multiecho pulse sequences of the brain and surrounding structures were obtained without intravenous contrast. COMPARISON:  MRI/MRA of the head September 02, 2015 FINDINGS: INTRACRANIAL CONTENTS: No reduced diffusion to suggest acute ischemia. Scratch The ventricles and sulci are normal for patient's age. Old RIGHT thalamus infarct. Old LEFT globus pallidus lacunar infarct. LEFT periatrial probable developmental venous anomaly/perivascular space. No suspicious parenchymal signal, mass lesions, mass effect. Again noted is susceptibility artifact within the vermis which could represent cavernoma though is nonspecific. No abnormal extra-axial fluid collections. No extra-axial masses though, contrast enhanced sequences would be more sensitive. Normal major intracranial vascular flow voids present at skull base. ORBITS: The included ocular globes and orbital contents are non-suspicious. Status post RIGHT ocular lens implant. SINUSES: Soft tissue opacifies the sphenoid sinuses with reduced diffusion. Trace mastoid effusions. SKULL/SOFT TISSUES: No abnormal sellar expansion. No suspicious calvarial bone marrow signal. Craniocervical junction maintained. IMPRESSION: No acute intracranial process, specifically no acute ischemia. 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 New sphenoid sinusitis, possible fungal component. Electronically Signed   By: Elon Alas M.D.   On: 09/07/2015 02:38    Assessment/Plan: Diagnosis: Deconditioning and encephalopathy following diabetic ketoacidosis with respiratory  failure. 1. Does the need for close, 24 hr/day medical supervision in concert with the patient's rehab needs make it unreasonable for this patient to be served in a less intensive setting? Yes 2. Co-Morbidities requiring supervision/potential complications: Diabetes, chronic renal insufficiency, hypertension 3. Due to bladder management, bowel management, safety, skin/wound care, disease management, medication administration, pain management and patient education, does the patient require 24 hr/day rehab nursing? Yes 4. Does the patient require coordinated care of a physician, rehab nurse, PT (1-2 hrs/day, 5 days/week), OT (1-2hrs/day, 5 days/week) and SLP (.5-1 hrs/day, 5 days/week) to address physical and functional deficits in the context of the above medical diagnosis(es)? Yes Addressing deficits in the following areas: balance, endurance, locomotion, strength, transferring, bowel/bladder control, bathing, dressing, feeding, grooming, toileting, cognition and psychosocial support 5. Can the patient actively participate in an intensive therapy program of at least 3 hrs of therapy per day at least 5 days per week? Yes 6. The potential for patient to make measurable gains while on inpatient rehab is good 7. Anticipated functional outcomes  upon discharge from inpatient rehab are supervision  with PT, supervision with OT, supervision with SLP. 8. Estimated rehab length of stay to reach the above functional goals is: 10-14 days 9. Does the patient have adequate social supports and living environment to accommodate these discharge functional goals? Yes 10. Anticipated D/C setting: Home 11. Anticipated post D/C treatments: [facility] therapy 12. Overall Rehab/Functional Prognosis: excellent  RECOMMENDATIONS: This patient's condition is appropriate for continued rehabilitative care in the following setting: CIR Patient has agreed to participate in recommended program. Yes Note that insurance prior authorization  may be required for reimbursement for recommended care.  Comment: Will need translator    09/07/2015

## 2015-09-07 NOTE — Progress Notes (Addendum)
Inpatient Rehabilitation  Met with patient and spouse at bedside to discuss team's recommendation for IP Rehab. Patient's wife asked appropriate questions and shared her concerns regarding access to medication and a primary care physician.  Will follow along for timing of medical readiness and IP Rehab bed availability.  Please call with questions.      Carmelia Roller., CCC/SLP Admission Coordinator  [place]  Cell [phone]

## 2015-09-07 NOTE — Progress Notes (Signed)
Nutrition Follow-up  DOCUMENTATION CODES:   Not applicable  INTERVENTION:   -Continue with carb modified diet  NUTRITION DIAGNOSIS:   Inadequate oral intake related to inability to eat as evidenced by NPO status.  Resolved  GOAL:   Patient will meet greater than or equal to 90% of their needs  Met  MONITOR:   PO intake, Labs, Weight trends, Skin, I & O's  REASON FOR ASSESSMENT:   Consult Enteral/tube feeding initiation and management  ASSESSMENT:   55 yo male with h/o CKD-4, HLD, uncontrolled DM, right eye blindness who presents to the hospital BIBA after found unresponsive by wife.  Pt extubated on 09/05/15. Pt was transferred from ICU to surgical unit on 09/06/15.  Pt underwent BSE on 09/06/15. Pt was initially placed on a dysphagia 3 diet with thin liquids, due to milk aspiration risk, but was re-evaluated this AM by SLP and advanced to a regular consistency diet. Pt and wife reports that pt is tolerating diet textures well and has a good appetite both presently and PTA.   Per discussion with nurse tech, pt consumed 100% of his breakfast this AM.   Discussed importance of good meal completion to promote healing. Pt and wife deny any further questions, but expressed appreciation for visit.    Labs reviewed: Na: 151, BUN/Creat: 37/3.7, CBGS: 131-198.   Diet Order:  Diet Carb Modified Fluid consistency:: Thin; Room service appropriate?: Yes  Skin:  Reviewed, no issues  Last BM:  09/03/15  Height:   Ht Readings from Last 1 Encounters:  09/06/15 '5\' 10"'  (1.778 m)    Weight:   Wt Readings from Last 1 Encounters:  09/06/15 160 lb (72.576 kg)    Ideal Body Weight:  75.5 kg  BMI:  Body mass index is 22.96 kg/(m^2).  Estimated Nutritional Needs:   Kcal:  1800-2000  Protein:  85-95 grams  Fluid:  1.8-2.0 L  EDUCATION NEEDS:   Education needs addressed  Bartholomew Ramesh A. Jimmye Norman, RD, LDN, CDE Pager: [phone] After hours Pager: [phone]

## 2015-09-07 NOTE — Progress Notes (Signed)
[facility] PROGRESS NOTE  MASHAUN MUMAW [id number] DOB: 28-Mar-1961 DOA: 09/02/2015 PCP: No PCP Per Patient  Outpatient specialist:  Brief HPI:  55 year old male with history of CK disease stage IV, hyperlipidemia, diabetes mellitus, right eye blindness who was brought to the hospital for altered mental status,hypoglycemia,seizurepatient was found to be in DKA. PCCM was  consulted and patient was intubated for airway protection.patient was extubated yesterday. Neurology was consulted for ongoing encephalopathy postoperatively due to DKA. Patient had weakness of left arm yesterday, MRI brain was done which was negative for stroke.  Active Problems:   DKA (diabetic ketoacidoses) (HCC)   Acute on chronic kidney failure (HCC)   Acute respiratory failure with hypercapnia (HCC)   Diabetic ketoacidosis with coma associated with type 2 diabetes mellitus (HCC)   Seizure-like activity (HCC)   Acidemia   Assessment/Plan: 1. Acute encephalopathy- resolved,likely metabolic from DKA. MRI brain negative for stroke or other acute lesion. Neurology has signed off. 2. Seizure- provoked from hyperglycemia, no AED medications needed per neurology. 3. Diabetic ketoacidosis- resolved, gap closed. IV insulin was discontinued. Patient started on Lantus, sliding scale insulin with NovoLog 4. Sphenoid sinusitis- MRI of brain showed new sphenoid sinusitis, possible fungal component.will start Diflucan 100 mg by mouth daily for 7 days. 5. Hypertension- continue Coreg,amlodipine. 6. Hypernatremia- sodium 151,continue half-normal saline at 40 ml/hr. Encourage po free water intake. 7. AKI on CKD stage 4- patient's baseline creatinine is around 3.5. Came to hospital with creatinine of 5.29, now slowly improving. Today creatinine 3.99. Follow BMP in a.m. 8. Elevated troponin- likely from demand ischemia. 1.22 > 1.36> 0.95. No chest pain    DVT prophylaxis: heparin Code Status: full code Family  Communication: spoke with patient's wife at bedside Disposition Plan: inpatient rehabilitation   Consultants:  Neurology  [facility]  Procedures: Micro: 4/15 BCx>> NGTD 4/15 UCx>> NGTD 4/18 BCx>>pending  Lines/Tubes: ETT 7.56mm 4/15>> OG 4/15 >> Foley 4/15>>   Antibiotics:  Ceftriaxone- 09/02/2015 1  Subjective: This morning patient is alert and oriented 3, communicating. Able to move all extremities. No weakness of left upper extremity  Objective: Filed Vitals:   09/06/15 2108 09/07/15 0115 09/07/15 0500 09/07/15 1221  BP: 172/68 159/65 169/66 166/70  Pulse: 78 70 84 72  Temp: 98.9 F (37.2 C) 98.8 F (37.1 C) 98.9 F (37.2 C) 98.2 F (36.8 C)  TempSrc: Oral   Oral  Resp: 17 18 18 18   Height:      Weight:      SpO2: 99% 98% 100% 100%    Intake/Output Summary (Last 24 hours) at 09/07/15 1247 Last data filed at 09/07/15 0900  Gross per 24 hour  Intake 1069.33 ml  Output   1830 ml  Net -760.67 ml   Filed Weights   09/05/15 0200 09/06/15 0200 09/06/15 2100  Weight: 75.8 kg (167 lb 1.7 oz) 72.1 kg (158 lb 15.2 oz) 72.576 kg (160 lb)    Examination:  General exam: Appears calm and comfortable  Respiratory system: Clear to auscultation. Respiratory effort normal. Cardiovascular system: S1 & S2 heard, RRR. No JVD, murmurs, rubs, gallops or clicks. No pedal edema. Gastrointestinal system: Abdomen is nondistended, soft and nontender. No organomegaly or masses felt. Normal bowel sounds heard. Central nervous system: Alert and oriented. No focal neurological deficits. Extremities: Symmetric 5 x 5 power. Skin: No rashes, lesions or ulcers Psychiatry: Judgement and insight appear normal. Mood & affect appropriate.    Data Reviewed: I have personally reviewed following labs and imaging studies  Basic Metabolic Panel:  Recent Labs Lab 09/03/15 1242  09/04/15 1013  09/04/15 2152 09/05/15 0004 09/05/15 0523 09/05/15 0838 09/05/15 1158 09/06/15 0230  09/07/15 0348  NA 137  < > 137  --  142  --  143  --   --  147* 151*  K 3.3*  < > 3.2*  --  3.5  --  3.2*  --   --  4.1 4.1  CL 109  < > 109  --  114*  --  111  --   --  114* 115*  CO2 16*  < > 16*  --  19*  --  21*  --   --  21* 21*  GLUCOSE 112*  < > 241*  --  125*  --  153*  --   --  128* 175*  BUN 63*  < > 48*  --  43*  --  46*  --   --  44* 38*  CREATININE 4.55*  < > 4.14*  --  3.95*  --  4.14*  --   --  3.99* 3.71*  CALCIUM 8.3*  < > 8.5*  --  8.4*  --  8.3*  --   --  8.8* 9.0  MG 1.9  --  1.9  --  1.9  --   --  2.0  --  2.1  --   PHOS 3.2  < > 3.5  < > 3.2 3.5 2.8 3.3 4.2 4.2  --   < > = values in this interval not displayed. Liver Function Tests:  Recent Labs Lab 09/02/15 1630 09/03/15 0036  AST 24 19  ALT 12* 12*  ALKPHOS 125 90  BILITOT 0.5 0.2*  PROT 6.9 6.0*  ALBUMIN 3.5 2.9*   No results for input(s): LIPASE, AMYLASE in the last 168 hours.  Recent Labs Lab 09/02/15 2047  AMMONIA 37*   CBC:  Recent Labs Lab 09/02/15 1844 09/02/15 2053 09/03/15 0036 09/03/15 0721 09/04/15 1013 09/05/15 0523 09/06/15 0230  WBC 23.8* 23.8* 15.0* 12.0* 9.7 8.2 9.0  NEUTROABS 22.0* 21.8*  --   --  7.7  --   --   HGB 12.4* 13.1 9.8* 8.8* 9.6* 8.3* 9.2*  HCT 33.2* 34.5* 25.9* 23.9* 27.3* 23.9* 27.2*  MCV 76.3* 76.2* 74.6* 74.2* 75.8* 77.3* 79.8  PLT 254 195 174 156 146* 129* 135*   Cardiac Enzymes:  Recent Labs Lab 09/02/15 2053 09/03/15 0036 09/03/15 0721 09/03/15 1242  CKTOTAL  --  138  --   --   CKMB  --  11.5*  --   --   TROPONINI 0.13*  --  1.36*  1.22* 0.95*   BNP (last 3 results)  Recent Labs  02/14/15 1604 02/19/15 0423  BNP 1109.7* 1476.0*    ProBNP (last 3 results) No results for input(s): PROBNP in the last 8760 hours.  CBG:  Recent Labs Lab 09/06/15 1552 09/06/15 2339 09/07/15 0400 09/07/15 0816 09/07/15 1205  GLUCAP 189* 198* 154* 131* 292*    Recent Results (from the past 240 hour(s))  Urine culture     Status: None    Collection Time: 09/02/15  7:18 PM  Result Value Ref Range Status   Specimen Description URINE, CATHETERIZED  Final   Special Requests Normal  Final   Culture NO GROWTH 2 DAYS  Final   Report Status 09/04/2015 FINAL  Final  Blood culture (routine x 2)     Status: None (Preliminary result)   Collection Time: 09/02/15  7:53 PM  Result Value Ref Range Status   Specimen Description BLOOD RIGHT HAND  Final   Special Requests IN PEDIATRIC BOTTLE 1CC  Final   Culture NO GROWTH 4 DAYS  Final   Report Status PENDING  Incomplete  Blood culture (routine x 2)     Status: None (Preliminary result)   Collection Time: 09/02/15  7:55 PM  Result Value Ref Range Status   Specimen Description BLOOD LEFT HAND  Final   Special Requests IN PEDIATRIC BOTTLE 1CC  Final   Culture NO GROWTH 4 DAYS  Final   Report Status PENDING  Incomplete  MRSA PCR Screening     Status: None   Collection Time: 09/02/15  9:22 PM  Result Value Ref Range Status   MRSA by PCR NEGATIVE NEGATIVE Final    Comment:        The GeneXpert MRSA Assay (FDA approved for NASAL specimens only), is one component of a comprehensive MRSA colonization surveillance program. It is not intended to diagnose MRSA infection nor to guide or monitor treatment for MRSA infections.   Culture, blood (routine x 2)     Status: None (Preliminary result)   Collection Time: 09/05/15  5:20 AM  Result Value Ref Range Status   Specimen Description BLOOD LEFT HAND  Final   Special Requests IN PEDIATRIC BOTTLE 2CC  Final   Culture NO GROWTH 1 DAY  Final   Report Status PENDING  Incomplete  Culture, blood (routine x 2)     Status: None (Preliminary result)   Collection Time: 09/05/15  5:28 AM  Result Value Ref Range Status   Specimen Description BLOOD LEFT ARM  Final   Special Requests IN PEDIATRIC BOTTLE 1CC  Final   Culture NO GROWTH 1 DAY  Final   Report Status PENDING  Incomplete     Studies: Mr Brain Wo Contrast  09/07/2015  CLINICAL DATA:   Hyperglycemia and acute encephalopathy. LEFT arm weakness today after discontinuing sedation yesterday. History of diabetes and hypertension. EXAM: MRI HEAD WITHOUT CONTRAST TECHNIQUE: Multiplanar, multiecho pulse sequences of the brain and surrounding structures were obtained without intravenous contrast. COMPARISON:  MRI/MRA of the head September 02, 2015 FINDINGS: INTRACRANIAL CONTENTS: No reduced diffusion to suggest acute ischemia. Scratch The ventricles and sulci are normal for patient's age. Old RIGHT thalamus infarct. Old LEFT globus pallidus lacunar infarct. LEFT periatrial probable developmental venous anomaly/perivascular space. No suspicious parenchymal signal, mass lesions, mass effect. Again noted is susceptibility artifact within the vermis which could represent cavernoma though is nonspecific. No abnormal extra-axial fluid collections. No extra-axial masses though, contrast enhanced sequences would be more sensitive. Normal major intracranial vascular flow voids present at skull base. ORBITS: The included ocular globes and orbital contents are non-suspicious. Status post RIGHT ocular lens implant. SINUSES: Soft tissue opacifies the sphenoid sinuses with reduced diffusion. Trace mastoid effusions. SKULL/SOFT TISSUES: No abnormal sellar expansion. No suspicious calvarial bone marrow signal. Craniocervical junction maintained. IMPRESSION: No acute intracranial process, specifically no acute ischemia. 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 New sphenoid sinusitis, possible fungal component. Electronically Signed   By: Elon Alas M.D.   On: 09/07/2015 02:38    Scheduled Meds: . amLODipine  10 mg Oral Daily  . aspirin  81 mg Oral Daily  . atorvastatin  40 mg Oral q1800  . carvedilol  6.25 mg Oral BID WC  . famotidine  20 mg Oral Daily  . fluconazole  100 mg Oral Daily  . heparin  5,000 Units Subcutaneous 3 times per day  . insulin aspart  0-20 Units Subcutaneous Q4H  . insulin  glargine  20 Units Subcutaneous Daily  . metoCLOPramide (REGLAN) injection  5 mg Intravenous TID AC   Continuous Infusions: . sodium chloride 40 mL/hr at 09/07/15 0526  . feeding supplement (VITAL AF 1.2 CAL) Stopped (09/05/15 [id number])       Time spent: 25 min    [facility] Pager [phone]*. If 7PM-7AM, please contact night-coverage at [email or web address], password [facility] 09/07/2015, 12:47 PM  LOS: 5 days

## 2015-09-07 NOTE — Care Management Note (Signed)
Case Management Note  Patient Details  Name: Russell Price MRN: [id number] Date of Birth: [birth year]  Subjective/Objective:                    Action/Plan:  Inpatient rehab reviewing chart  Expected Discharge Date:                  Expected Discharge Plan:  [place]  In-House Referral:     Discharge planning Services  CM Consult  Post Acute Care Choice:    Choice offered to:     DME Arranged:    DME Agency:     HH Arranged:    HH Agency:     Status of Service:  In process, will continue to follow  Medicare Important Message Given:    Date Medicare IM Given:    Medicare IM give by:    Date Additional Medicare IM Given:    Additional Medicare Important Message give by:     If discussed at [place] of Stay Meetings, dates discussed:    Additional Comments:  Marilu Favre, RN 09/07/2015, 3:16 PM

## 2015-09-07 NOTE — Progress Notes (Signed)
Speech Language Pathology Treatment: Dysphagia  Patient Details Name: Russell Price MRN: [id number] DOB: 1960-07-02 Today's Date: 09/07/2015 Time: 8472-0721 SLP Time Calculation (min) (ACUTE ONLY): 11 min  Assessment / Plan / Recommendation Clinical Impression  Pts ability to swallow liquids and masticate solids now WNL. No signs of difficulty or residuals. Pt may resume a regular texture diet. No SLP f/u needed.    HPI HPI: pt presents after being found down with DKA, Encephalopathy, and Seizure activity. pt was intubated 4/15 - 4/18. pt with hx of CKD, DM, and R eye blindness.Prior swallow evaluation in 02/2015 recommended Dys 3 diet and thin liquids with advancement to regular consistencies prior to d/c.      SLP Plan  All goals met     Recommendations  Diet recommendations: Regular;Thin liquid Liquids provided via: Cup;Straw Medication Administration: Whole meds with liquid Supervision: Patient able to self feed             Plan: All goals met     GO                Russell Price, Katherene Ponto 09/07/2015, 10:58 AM

## 2015-09-08 ENCOUNTER — Inpatient Hospital Stay (HOSPITAL_COMMUNITY)
Admission: RE | Admit: 2015-09-08 | Discharge: 2015-09-16 | DRG: 947 | Disposition: A | Discharge status: Home Health | Payer: Medicaid Other | Source: Intra-hospital | Attending: Physical Medicine & Rehabilitation | Admitting: Physical Medicine & Rehabilitation

## 2015-09-08 DIAGNOSIS — N184 Chronic kidney disease, stage 4 (severe): Secondary | ICD-10-CM

## 2015-09-08 DIAGNOSIS — Z7982 Long term (current) use of aspirin: Secondary | ICD-10-CM | POA: Diagnosis not present

## 2015-09-08 DIAGNOSIS — E1122 Type 2 diabetes mellitus with diabetic chronic kidney disease: Secondary | ICD-10-CM | POA: Diagnosis not present

## 2015-09-08 DIAGNOSIS — G9340 Encephalopathy, unspecified: Secondary | ICD-10-CM | POA: Diagnosis not present

## 2015-09-08 DIAGNOSIS — Z79899 Other long term (current) drug therapy: Secondary | ICD-10-CM

## 2015-09-08 DIAGNOSIS — E11649 Type 2 diabetes mellitus with hypoglycemia without coma: Secondary | ICD-10-CM | POA: Insufficient documentation

## 2015-09-08 DIAGNOSIS — J323 Chronic sphenoidal sinusitis: Secondary | ICD-10-CM

## 2015-09-08 DIAGNOSIS — E785 Hyperlipidemia, unspecified: Secondary | ICD-10-CM

## 2015-09-08 DIAGNOSIS — E118 Type 2 diabetes mellitus with unspecified complications: Secondary | ICD-10-CM

## 2015-09-08 DIAGNOSIS — E1142 Type 2 diabetes mellitus with diabetic polyneuropathy: Secondary | ICD-10-CM

## 2015-09-08 DIAGNOSIS — D638 Anemia in other chronic diseases classified elsewhere: Secondary | ICD-10-CM | POA: Insufficient documentation

## 2015-09-08 DIAGNOSIS — R7309 Other abnormal glucose: Secondary | ICD-10-CM | POA: Insufficient documentation

## 2015-09-08 DIAGNOSIS — H5441 Blindness, right eye, normal vision left eye: Secondary | ICD-10-CM

## 2015-09-08 DIAGNOSIS — E875 Hyperkalemia: Secondary | ICD-10-CM | POA: Insufficient documentation

## 2015-09-08 DIAGNOSIS — J0130 Acute sphenoidal sinusitis, unspecified: Secondary | ICD-10-CM | POA: Insufficient documentation

## 2015-09-08 DIAGNOSIS — E1310 Other specified diabetes mellitus with ketoacidosis without coma: Secondary | ICD-10-CM | POA: Diagnosis not present

## 2015-09-08 DIAGNOSIS — H5440 Blindness, one eye, unspecified eye: Secondary | ICD-10-CM

## 2015-09-08 DIAGNOSIS — K5900 Constipation, unspecified: Secondary | ICD-10-CM

## 2015-09-08 DIAGNOSIS — R5381 Other malaise: Secondary | ICD-10-CM | POA: Diagnosis present

## 2015-09-08 DIAGNOSIS — E119 Type 2 diabetes mellitus without complications: Secondary | ICD-10-CM | POA: Insufficient documentation

## 2015-09-08 DIAGNOSIS — D649 Anemia, unspecified: Secondary | ICD-10-CM | POA: Diagnosis not present

## 2015-09-08 DIAGNOSIS — I129 Hypertensive chronic kidney disease with stage 1 through stage 4 chronic kidney disease, or unspecified chronic kidney disease: Secondary | ICD-10-CM

## 2015-09-08 DIAGNOSIS — R0989 Other specified symptoms and signs involving the circulatory and respiratory systems: Secondary | ICD-10-CM | POA: Insufficient documentation

## 2015-09-08 DIAGNOSIS — N189 Chronic kidney disease, unspecified: Secondary | ICD-10-CM

## 2015-09-08 DIAGNOSIS — Z794 Long term (current) use of insulin: Secondary | ICD-10-CM | POA: Diagnosis not present

## 2015-09-08 DIAGNOSIS — E1110 Type 2 diabetes mellitus with ketoacidosis without coma: Secondary | ICD-10-CM | POA: Diagnosis present

## 2015-09-08 DIAGNOSIS — Z87891 Personal history of nicotine dependence: Secondary | ICD-10-CM

## 2015-09-08 DIAGNOSIS — I10 Essential (primary) hypertension: Secondary | ICD-10-CM | POA: Diagnosis present

## 2015-09-08 DIAGNOSIS — N179 Acute kidney failure, unspecified: Secondary | ICD-10-CM | POA: Diagnosis present

## 2015-09-08 LAB — CBC
HEMATOCRIT: 29 % — AB (ref 39.0–52.0)
HEMOGLOBIN: 9.5 g/dL — AB (ref 13.0–17.0)
MCH: 26.5 pg (ref 26.0–34.0)
MCHC: 32.8 g/dL (ref 30.0–36.0)
MCV: 81 fL (ref 78.0–100.0)
Platelets: 161 10*3/uL (ref 150–400)
RBC: 3.58 MIL/uL — ABNORMAL LOW (ref 4.22–5.81)
RDW: 12.8 % (ref 11.5–15.5)
WBC: 5.5 10*3/uL (ref 4.0–10.5)

## 2015-09-08 LAB — BASIC METABOLIC PANEL
ANION GAP: 14 (ref 5–15)
BUN: 35 mg/dL — AB (ref 6–20)
CHLORIDE: 106 mmol/L (ref 101–111)
CO2: 21 mmol/L — AB (ref 22–32)
Calcium: 8.6 mg/dL — ABNORMAL LOW (ref 8.9–10.3)
Creatinine, Ser: 3.46 mg/dL — ABNORMAL HIGH (ref 0.61–1.24)
GFR calc Af Amer: 21 mL/min — ABNORMAL LOW (ref >60)
GFR, EST NON AFRICAN AMERICAN: 18 mL/min — AB (ref >60)
GLUCOSE: 202 mg/dL — AB (ref 65–99)
POTASSIUM: 3.8 mmol/L (ref 3.5–5.1)
Sodium: 141 mmol/L (ref 135–145)

## 2015-09-08 LAB — GLUCOSE, CAPILLARY
GLUCOSE-CAPILLARY: 138 mg/dL — AB (ref 65–99)
GLUCOSE-CAPILLARY: 189 mg/dL — AB (ref 65–99)
GLUCOSE-CAPILLARY: 292 mg/dL — AB (ref 65–99)
GLUCOSE-CAPILLARY: 165 mg/dL — AB (ref 65–99)
Glucose-Capillary: 147 mg/dL — ABNORMAL HIGH (ref 65–99)
Glucose-Capillary: 277 mg/dL — ABNORMAL HIGH (ref 65–99)

## 2015-09-08 MED ORDER — INSULIN ASPART 100 UNIT/ML ~~LOC~~ SOLN
0.0000 [IU] | Freq: Three times a day (TID) | SUBCUTANEOUS | Status: DC
  Administered 2015-09-08: 4 [IU] via SUBCUTANEOUS
  Administered 2015-09-08: 11 [IU] via SUBCUTANEOUS

## 2015-09-08 MED ORDER — INSULIN GLARGINE 100 UNIT/ML ~~LOC~~ SOLN
20.0000 [IU] | Freq: Every day | SUBCUTANEOUS | Status: DC
  Administered 2015-09-09 – 2015-09-15 (×7): 20 [IU] via SUBCUTANEOUS
  Filled 2015-09-08 (×7): qty 0.2

## 2015-09-08 MED ORDER — ONDANSETRON HCL 4 MG PO TABS: 4.0000 mg | ORAL_TABLET | Freq: Four times a day (QID) | ORAL | Status: DC | PRN

## 2015-09-08 MED ORDER — FAMOTIDINE 20 MG PO TABS
20.0000 mg | ORAL_TABLET | Freq: Every day | ORAL | Status: DC
  Administered 2015-09-09 – 2015-09-16 (×8): 20 mg via ORAL
  Filled 2015-09-08 (×8): qty 1

## 2015-09-08 MED ORDER — ONDANSETRON HCL 4 MG/2ML IJ SOLN: 4.0000 mg | Freq: Four times a day (QID) | INTRAMUSCULAR | Status: DC | PRN

## 2015-09-08 MED ORDER — ASPIRIN 81 MG PO CHEW
81.0000 mg | CHEWABLE_TABLET | Freq: Every day | ORAL | Status: DC
  Administered 2015-09-09 – 2015-09-16 (×8): 81 mg via ORAL
  Filled 2015-09-08 (×8): qty 1

## 2015-09-08 MED ORDER — FLUCONAZOLE 100 MG PO TABS
100.0000 mg | ORAL_TABLET | Freq: Every day | ORAL | Status: AC
  Administered 2015-09-09 – 2015-09-14 (×6): 100 mg via ORAL
  Filled 2015-09-08 (×6): qty 1

## 2015-09-08 MED ORDER — CARVEDILOL 6.25 MG PO TABS
6.2500 mg | ORAL_TABLET | Freq: Two times a day (BID) | ORAL | Status: DC
  Administered 2015-09-08 – 2015-09-16 (×16): 6.25 mg via ORAL
  Filled 2015-09-08 (×16): qty 1

## 2015-09-08 MED ORDER — ATORVASTATIN CALCIUM 40 MG PO TABS
40.0000 mg | ORAL_TABLET | Freq: Every day | ORAL | Status: DC
  Administered 2015-09-08 – 2015-09-15 (×8): 40 mg via ORAL
  Filled 2015-09-08 (×8): qty 1

## 2015-09-08 MED ORDER — AMLODIPINE BESYLATE 10 MG PO TABS
10.0000 mg | ORAL_TABLET | Freq: Every day | ORAL | Status: DC
  Administered 2015-09-09 – 2015-09-16 (×8): 10 mg via ORAL
  Filled 2015-09-08 (×8): qty 1

## 2015-09-08 MED ORDER — ACETAMINOPHEN 325 MG PO TABS
325.0000 mg | ORAL_TABLET | ORAL | Status: DC | PRN
  Administered 2015-09-12: 650 mg via ORAL
  Filled 2015-09-08: qty 2

## 2015-09-08 MED ORDER — HEPARIN SODIUM (PORCINE) 5000 UNIT/ML IJ SOLN: 5000.0000 [IU] | Freq: Three times a day (TID) | INTRAMUSCULAR | Status: DC

## 2015-09-08 MED ORDER — SORBITOL 70 % SOLN
30.0000 mL | Freq: Every day | Status: DC | PRN
  Administered 2015-09-13: 30 mL via ORAL
  Filled 2015-09-08: qty 30

## 2015-09-08 MED ORDER — INSULIN ASPART 100 UNIT/ML ~~LOC~~ SOLN
0.0000 [IU] | Freq: Three times a day (TID) | SUBCUTANEOUS | Status: DC
  Administered 2015-09-08: 11 [IU] via SUBCUTANEOUS
  Administered 2015-09-08: 4 [IU] via SUBCUTANEOUS
  Administered 2015-09-09: 11 [IU] via SUBCUTANEOUS
  Administered 2015-09-09 (×2): 4 [IU] via SUBCUTANEOUS
  Administered 2015-09-09: 11 [IU] via SUBCUTANEOUS
  Administered 2015-09-10: 3 [IU] via SUBCUTANEOUS
  Administered 2015-09-10: 11 [IU] via SUBCUTANEOUS
  Administered 2015-09-10 – 2015-09-12 (×5): 4 [IU] via SUBCUTANEOUS
  Administered 2015-09-12: 11 [IU] via SUBCUTANEOUS
  Administered 2015-09-13: 7 [IU] via SUBCUTANEOUS
  Administered 2015-09-13: 3 [IU] via SUBCUTANEOUS
  Administered 2015-09-13: 20 [IU] via SUBCUTANEOUS
  Administered 2015-09-14: 4 [IU] via SUBCUTANEOUS
  Administered 2015-09-14: 7 [IU] via SUBCUTANEOUS
  Administered 2015-09-14: 3 [IU] via SUBCUTANEOUS
  Administered 2015-09-14: 11 [IU] via SUBCUTANEOUS
  Administered 2015-09-15: 4 [IU] via SUBCUTANEOUS
  Administered 2015-09-15 (×2): 7 [IU] via SUBCUTANEOUS
  Administered 2015-09-16: 4 [IU] via SUBCUTANEOUS

## 2015-09-08 MED ORDER — HEPARIN SODIUM (PORCINE) 5000 UNIT/ML IJ SOLN
5000.0000 [IU] | Freq: Three times a day (TID) | INTRAMUSCULAR | Status: DC
  Administered 2015-09-08 – 2015-09-15 (×21): 5000 [IU] via SUBCUTANEOUS
  Filled 2015-09-08 (×21): qty 1

## 2015-09-08 MED ORDER — FAMOTIDINE 20 MG PO TABS: 20.0000 mg | ORAL_TABLET | Freq: Every day | ORAL | Status: DC

## 2015-09-08 NOTE — PMR Pre-admission (Signed)
PMR Admission Coordinator Pre-Admission Assessment  Patient: Russell Price is an 55 y.o., male MRN: [id number] DOB: 05/12/1961 Height: 5\' 10"  (177.8 cm) Weight: 77.52 kg (170 lb 14.4 oz)              Insurance Information HMO:     PPO:      PCP:      IPA:      80/20:      OTHER:  PRIMARY: uninsured       Policy#:       Subscriber:  CM Name:       Phone#:      Fax#:  Pre-Cert#:       Employer:  Benefits:  Phone #:      Name:  Eff. Date:      Deduct:       Out of Pocket Max:       Life Max:  CIR:       SNF:  Outpatient:      Co-Pay:  Home Health:       Co-Pay:  DME:      Co-Pay:  Providers:   Medicaid Application Date:       Case Manager:  Disability Application Date:       Case Worker:   Emergency Contact Information Contact Information    Name Relation Home Work Mobile   Russell Price Spouse [phone]       Current Medical History  Patient Admitting Diagnosis: Deconditioning and encephalopathy following diabetic ketoacidosis with respiratory failure.  History of Present Illness: Russell Price is a 55 y.o. right handed male with history of chronic renal insufficiency with baseline creatinine 3.95, uncontrolled diabetes mellitus, hypertension, right eye blindness. By chart review patient is married and independent prior to admission. 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 Presented 09/02/2015 with increasing fatigue and weakness over the past few days and later found unresponsive by wife. Noted 2 lacerations on his head. There was concern for possible seizure. He was intubated for airway protection.Elevated troponin 1.22-1.36-0.95 felt to be related to demand ischemia and no cardiology workup advised. Cranial CT scan cervical spine films negative. MRI of the brain negative for acute intracranial process And incidental noting of sphenoid sinusitis. 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 Patient did not receive TPA. Echocardiogram with ejection fraction of 60% no wall motion abnormalities without PFO. EEG to generalized background slowing indicating of diffuse cerebral dysfunction. No seizure activity. Patient was extubated 09/05/2015. Subcutaneous heparin for DVT prophylaxis. Currently on a mechanical soft diet. Physical therapy evaluation completed 09/06/2015 with recommendations of physical medicine rehabilitation consult. Patient was admitted for a comprehensive rehabilitation program.       Past Medical History  Past Medical History  Diagnosis Date  . Diabetes mellitus without complication ([place])     followed by Dr Chalmers Cater  . Hypertension     followed by [facility]  . Chronic kidney disease     Family History  family history includes Diabetes in his mother.  Prior Rehab/Hospitalizations:  Has the patient had major surgery during 100 days prior to admission? No  Current Medications   Current facility-administered medications:  .  0.45 % sodium chloride infusion, , Intravenous, Continuous, Lucious Groves, DO, Last Rate: 40 mL/hr at 09/08/15 0533 .  acetaminophen (  TYLENOL) solution 650 mg, 650 mg, Per Tube, Q6H PRN, Collene Gobble, MD, 650 mg at 09/05/15 0535 .  amLODipine (NORVASC) tablet 10 mg, 10 mg, Oral, Daily, Roswell Nickel, MD, 10 mg at 09/08/15 0958 .  aspirin chewable tablet 81 mg, 81 mg, Oral, Daily, Maryellen Pile, MD, 81 mg at 09/08/15 0958 .  atorvastatin (LIPITOR) tablet 40 mg, 40 mg, Oral, q1800, Roswell Nickel, MD, 40 mg at 09/07/15 1658 .  carvedilol (COREG) tablet 6.25 mg, 6.25 mg, Oral, BID WC, Roswell Nickel, MD, 6.25 mg at 09/08/15 0958 .  famotidine (PEPCID) tablet 20 mg, 20 mg, Oral, Daily, Priscella Mann, RPH, 20 mg at 09/08/15 1003 .  fentaNYL (SUBLIMAZE) injection 50 mcg, 50 mcg, Intravenous, Q1H PRN,  Duffy Bruce, MD, 50 mcg at 09/08/15 0153 .  fluconazole (DIFLUCAN) tablet 100 mg, 100 mg, Oral, Daily, Oswald Hillock, MD, 100 mg at 09/08/15 0958 .  heparin injection 5,000 Units, 5,000 Units, Subcutaneous, 3 times per day, Roswell Nickel, MD, 5,000 Units at 09/08/15 0530 .  hydrALAZINE (APRESOLINE) injection 10 mg, 10 mg, Intravenous, Q4H PRN, Maryellen Pile, MD, 10 mg at 09/07/15 [id number] .  insulin aspart (novoLOG) injection 0-20 Units, 0-20 Units, Subcutaneous, TID WC & HS, Oswald Hillock, MD, 4 Units at 09/08/15 0802 .  insulin glargine (LANTUS) injection 20 Units, 20 Units, Subcutaneous, Daily, Lucious Groves, DO, 20 Units at 09/08/15 [id number] .  metoCLOPramide (REGLAN) injection 5 mg, 5 mg, Intravenous, TID AC, Lucious Groves, DO, 5 mg at 09/08/15 0958 .  ondansetron (ZOFRAN) injection 4 mg, 4 mg, Intravenous, Q8H PRN, 4 mg at 09/06/15 0508 **OR** ondansetron (ZOFRAN) tablet 4 mg, 4 mg, Oral, Q8H PRN, Francesca Oman, DO  Patients Current Diet: Diet Carb Modified Fluid consistency:: Thin; Room service appropriate?: Yes Diet - low sodium heart healthy  Precautions / Restrictions Precautions Precautions: Fall Restrictions Weight Bearing Restrictions: No   Has the patient had 2 or more falls or a fall with injury in the past year?Yes, fall just prior to this admission.  He hit his head but scans were clear per wife's report.  Prior Activity Level Community (5-7x/wk): Patient was working, driving and playing soccer prior to admission.  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   Home Assistive Devices / Equipment Home Assistive Devices/Equipment: None  Prior Device Use: Indicate devices/aids used by the patient prior to current illness, exacerbation or injury? None of the above  Prior Functional Level Prior Function Level of Independence: Independent Comments: Per wife pt was laid off, but worked as an Engineer, technical sales and plays soccer  every weekend.  Self Care: Did the patient need help bathing, dressing, using the toilet or eating? Independent  Indoor Mobility: Did the patient need assistance with walking from room to room (with or without device)? Independent  Stairs: Did the patient need assistance with internal or external stairs (with or without device)? Independent  Functional Cognition: Did the patient need help planning regular tasks such as shopping or remembering to take medications? Independent  Current Functional Level Cognition  Overall Cognitive Status: Difficult to assess Difficult to assess due to: Non-English speaking (pt less flat but requires increased assistance from wife to translate.  ) Orientation Level: Oriented X4    Extremity Assessment (includes Sensation/Coordination)  Upper Extremity Assessment: Defer to OT evaluation  Lower Extremity Assessment: Generalized weakness, LLE deficits/detail LLE Deficits / Details: Overall  weakness, but L LE grossly 2-3/5 with question diminished sensation.   LLE Sensation: decreased light touch LLE Coordination: decreased fine motor, decreased gross motor    ADLs       Mobility  Overal bed mobility: Needs Assistance Bed Mobility: Supine to Sit Supine to sit: Min assist Sit to supine: Mod assist, +2 for physical assistance General bed mobility comments: Pt required cues for B LEs advance  and min assist to elevate trunk into sitting.      Transfers  Overall transfer level: Needs assistance Equipment used: Rolling walker (2 wheeled) Transfers: Sit to/from Stand Sit to Stand: Min assist, +2 safety/equipment General transfer comment: Pt required cues to push from seated surface.  Pt pushed with L hand but minimal effort noted requiring assist to boost.  pt placed R hand on RW during transition.      Ambulation / Gait / Stairs / Wheelchair Mobility  Ambulation/Gait Ambulation/Gait assistance: Museum/gallery curator (Feet): 150  Feet Assistive device: Rolling walker (2 wheeled) Gait Pattern/deviations: Step-through pattern, Decreased stride length, Decreased step length - left, Trunk flexed General Gait Details: Pt required bracing of L wrist on RW.  Pt required cues to increase L stride length and L foot clearance.  Pt unable to turn left requiring mod assist to turn walker L due to weakness on L side.  Coordination and strength deficits limit progress.   Gait velocity interpretation: Below normal speed for age/gender    Posture / Balance Balance Overall balance assessment: Needs assistance Sitting-balance support: No upper extremity supported, Feet supported Sitting balance-Leahy Scale: Fair Postural control: Left lateral lean Standing balance support: During functional activity Standing balance-Leahy Scale: Poor    Special needs/care consideration BiPAP/CPAP: No CPM: No Continuous Drip IV: 0.45% NS 101mL/hr Dialysis: No         Life Vest: No Oxygen: No Special Bed: No Trach Size: No Wound Vac (area): No       Skin: WDL                               Bowel mgmt: 09/06/15 Bladder mgmt: continent use of urinal  Diabetic mgmt: Yes, manages with insulin PTA when he had insurance     Previous Home Environment Living Arrangements: Spouse/significant other, Other relatives [place]: No  Discharge Living Setting Plans for Discharge Living Setting: Patient's home Type of Home at Discharge: Apartment Discharge Home Layout: One level Discharge Home Access: Other (comment) (threshold) Discharge Bathroom Shower/Tub: Tub/shower unit, Curtain Discharge Bathroom Toilet: Standard Discharge Bathroom Accessibility: No Does the patient have any problems obtaining your medications?: Yes (Describe) (recently lost insurance which resulted in this hospital stay)  Social/Family/Support Systems Patient Roles: Spouse Anticipated Caregiver: Spouse: Bing Neighbors Anticipated Ambulance person Information:  [phone] Ability/Limitations of Caregiver: None Caregiver Availability: 24/7 Discharge Plan Discussed with Primary Caregiver: Yes Is Caregiver In Agreement with Plan?: Yes Does Caregiver/Family have Issues with Lodging/Transportation while Pt is in Rehab?: No  Goals/Additional Needs Patient/Family Goal for Rehab: PT/OT/SLP Supervision  Expected length of stay: 10-14 days Cultural Considerations: Patient's first language is Arabic; however, they have declined a Optometrist  Dietary Needs: No: Pork, [place], Beef or Gelatin Equipment Needs: TBD Special Service Needs: Patient needs diabetic education  Additional Information: Only eats meats: chicken and fish Pt/Family Agrees to Admission and willing to participate: Yes Program Orientation Provided & Reviewed with Pt/Caregiver Including Roles  & Responsibilities: Yes Additional Information Needs: Patient  needs to be contected to a PCP, assistance with medications, financial counseling and diabetes education  Information Needs to be Provided By: CSW and RN  Decrease burden of Care through IP rehab admission: No  Possible need for SNF placement upon discharge: Not anticipated   Patient Condition: This patient's condition remains as documented in the consult dated 09/07/15, in which the Rehabilitation Physician determined and documented that the patient's condition is appropriate for intensive rehabilitative care in an inpatient rehabilitation facility. Will admit to inpatient rehab today.  Preadmission Screen Completed By:  Gunnar Fusi, 09/08/2015 12:31 PM ______________________________________________________________________   Discussed status with Dr. Naaman Plummer on 09/08/15 at 1238 and received telephone approval for admission today.  Admission Coordinator:  Gunnar Fusi, time 1238/Date 09/08/15

## 2015-09-08 NOTE — Progress Notes (Signed)
Russell Price Rehab Admission Coordinator Signed Physical Medicine and Rehabilitation PMR Pre-admission 09/08/2015 12:31 PM  Related encounter: ED to Hosp-Admission (Current) from 09/02/2015 in [facility]   PMR Admission Coordinator Pre-Admission Assessment  Patient: Russell Price is an 55 y.o., male MRN: [id number] DOB: [birth year] Height: 5\' 10"  (177.8 cm) Weight: 77.52 kg (170 lb 14.4 oz)  Insurance Information HMO: PPO: PCP: IPA: 80/20: OTHER:  PRIMARY: uninsured Policy#: Subscriber:  CM Name: Phone#: Fax#:  Pre-Cert#: Employer:  Benefits: Phone #: Name:  Eff. Date: Deduct: Out of Pocket Max: Life Max:  CIR: SNF:  Outpatient: Co-Pay:  Home Health: Co-Pay:  DME: Co-Pay:  Providers:   Medicaid Application Date: Case Manager:  Disability Application Date: Case Worker:   Emergency Contact Information Contact Information    Name Relation Home Work Mobile   Raga,Zaki Spouse [phone]       Current Medical History  Patient Admitting Diagnosis: Deconditioning and encephalopathy following diabetic ketoacidosis with respiratory failure.  History of Present Illness: Russell Price is a 55 y.o. right handed male with history of chronic renal insufficiency with baseline creatinine 3.95, uncontrolled diabetes mellitus, hypertension, right eye blindness. By chart review patient is married and independent prior to admission. 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 Presented 09/02/2015 with increasing fatigue and weakness over the past few days and later found unresponsive by wife. Noted 2 lacerations on his head. There was  concern for possible seizure. He was intubated for airway protection.Elevated troponin 1.22-1.36-0.95 felt to be related to demand ischemia and no cardiology workup advised. Cranial CT scan cervical spine films negative. MRI of the brain negative for acute intracranial process And incidental noting of sphenoid sinusitis. 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 Patient did not receive TPA. Echocardiogram with ejection fraction of 60% no wall motion abnormalities without PFO. EEG to generalized background slowing indicating of diffuse cerebral dysfunction. No seizure activity. Patient was extubated 09/05/2015. Subcutaneous heparin for DVT prophylaxis. Currently on a mechanical soft diet. Physical therapy evaluation completed 09/06/2015 with recommendations of physical medicine rehabilitation consult. Patient was admitted for a comprehensive rehabilitation program.       Past Medical History  Past Medical History  Diagnosis Date  . Diabetes mellitus without complication ([place])     followed by Dr Chalmers Cater  . Hypertension     followed by [facility]  . Chronic kidney disease     Family History  family history includes Diabetes in his mother.  Prior Rehab/Hospitalizations:  Has the patient had major surgery during 100 days prior to admission? No  Current Medications   Current facility-administered medications:  . 0.45 % sodium chloride infusion, , Intravenous, Continuous, Lucious Groves, DO, Last Rate: 40 mL/hr at 09/08/15 0533 . acetaminophen (TYLENOL) solution 650 mg, 650 mg, Per Tube, Q6H PRN, Collene Gobble, MD, 650 mg at 09/05/15 0535 . amLODipine (NORVASC) tablet 10 mg, 10 mg, Oral, Daily, Roswell Nickel, MD, 10 mg at 09/08/15 0958 . aspirin chewable tablet 81 mg, 81 mg, Oral,  Daily, Maryellen Pile, MD, 81 mg at 09/08/15 0958 . atorvastatin (LIPITOR) tablet 40 mg, 40 mg, Oral, q1800, Roswell Nickel, MD, 40 mg at 09/07/15 1658 . carvedilol (COREG) tablet 6.25 mg, 6.25  mg, Oral, BID WC, Roswell Nickel, MD, 6.25 mg at 09/08/15 [id number] . famotidine (PEPCID) tablet 20 mg, 20 mg, Oral, Daily, Priscella Mann, RPH, 20 mg at 09/08/15 1003 . fentaNYL (SUBLIMAZE) injection 50 mcg, 50 mcg, Intravenous, Q1H PRN, Duffy Bruce, MD, 50 mcg at 09/08/15 0153 . fluconazole (DIFLUCAN) tablet 100 mg, 100 mg, Oral, Daily, Oswald Hillock, MD, 100 mg at 09/08/15 0958 . heparin injection 5,000 Units, 5,000 Units, Subcutaneous, 3 times per day, Roswell Nickel, MD, 5,000 Units at 09/08/15 0530 . hydrALAZINE (APRESOLINE) injection 10 mg, 10 mg, Intravenous, Q4H PRN, Maryellen Pile, MD, 10 mg at 09/07/15 [id number] . insulin aspart (novoLOG) injection 0-20 Units, 0-20 Units, Subcutaneous, TID WC & HS, Oswald Hillock, MD, 4 Units at 09/08/15 0802 . insulin glargine (LANTUS) injection 20 Units, 20 Units, Subcutaneous, Daily, Lucious Groves, DO, 20 Units at 09/08/15 [phone] . metoCLOPramide (REGLAN) injection 5 mg, 5 mg, Intravenous, TID AC, Lucious Groves, DO, 5 mg at 09/08/15 0958 . ondansetron (ZOFRAN) injection 4 mg, 4 mg, Intravenous, Q8H PRN, 4 mg at 09/06/15 0508 **OR** ondansetron (ZOFRAN) tablet 4 mg, 4 mg, Oral, Q8H PRN, Francesca Oman, DO  Patients Current Diet: Diet Carb Modified Fluid consistency:: Thin; Room service appropriate?: Yes Diet - low sodium heart healthy  Precautions / Restrictions Precautions Precautions: Fall Restrictions Weight Bearing Restrictions: No   Has the patient had 2 or more falls or a fall with injury in the past year?Yes, fall just prior to this admission. He hit his head but scans were clear per wife's report.  Prior Activity Level Community (5-7x/wk): Patient was working, driving and playing soccer prior to admission. 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   Home Assistive Devices / Equipment Home Assistive Devices/Equipment: None  Prior Device Use: Indicate devices/aids used by the patient prior to current illness, exacerbation or injury? 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  Self Care: Did the patient need help bathing, dressing, using the toilet or eating? Independent  Indoor Mobility: Did the patient need assistance with walking from room to room (with or without device)? Independent  Stairs: Did the patient need assistance with internal or external stairs (with or without device)? Independent  Functional Cognition: Did the patient need help planning regular tasks such as shopping or remembering to take medications? Independent  Current Functional Level Cognition  Overall Cognitive Status: Difficult to assess Difficult to assess due to: Non-English speaking (pt less flat but requires increased assistance from wife to translate. ) Orientation Level: Oriented X4   Extremity Assessment (includes Sensation/Coordination)  Upper Extremity Assessment: Defer to OT evaluation  Lower Extremity Assessment: Generalized weakness, LLE deficits/detail LLE Deficits / Details: Overall weakness, but L LE grossly 2-3/5 with question diminished sensation.  LLE Sensation: decreased light touch LLE Coordination: decreased fine motor, decreased gross motor    ADLs       Mobility  Overal bed mobility: Needs Assistance Bed Mobility: Supine to Sit Supine to sit: Min assist Sit to supine: Mod assist, +2 for physical assistance General bed mobility comments: Pt required cues for B LEs advance and min assist to elevate trunk into sitting.     Transfers  Overall transfer level: Needs assistance Equipment used:  Rolling walker (2 wheeled) Transfers: Sit to/from  Stand Sit to Stand: Min assist, +2 safety/equipment General transfer comment: Pt required cues to push from seated surface. Pt pushed with L hand but minimal effort noted requiring assist to boost. pt placed R hand on RW during transition.     Ambulation / Gait / Stairs / Wheelchair Mobility  Ambulation/Gait Ambulation/Gait assistance: Museum/gallery curator (Feet): 150 Feet Assistive device: Rolling walker (2 wheeled) Gait Pattern/deviations: Step-through pattern, Decreased stride length, Decreased step length - left, Trunk flexed General Gait Details: Pt required bracing of L wrist on RW. Pt required cues to increase L stride length and L foot clearance. Pt unable to turn left requiring mod assist to turn walker L due to weakness on L side. Coordination and strength deficits limit progress.  Gait velocity interpretation: Below normal speed for age/gender    Posture / Balance Balance Overall balance assessment: Needs assistance Sitting-balance support: No upper extremity supported, Feet supported Sitting balance-Leahy Scale: Fair Postural control: Left lateral lean Standing balance support: During functional activity Standing balance-Leahy Scale: Poor    Special needs/care consideration BiPAP/CPAP: No CPM: No Continuous Drip IV: 0.45% NS 69mL/hr Dialysis: No  Life Vest: No Oxygen: No Special Bed: No Trach Size: No Wound Vac (area): No  Skin: WDL  Bowel mgmt: 09/06/15 Bladder mgmt: continent use of urinal  Diabetic mgmt: Yes, manages with insulin PTA when he had insurance     Previous Home Environment Living Arrangements: Spouse/significant other, Other relatives [place]: No  Discharge Living Setting Plans for Discharge Living Setting: Patient's home Type of Home at Discharge: Apartment Discharge Home Layout: One level Discharge Home Access:  Other (comment) (threshold) Discharge Bathroom Shower/Tub: Tub/shower unit, Curtain Discharge Bathroom Toilet: Standard Discharge Bathroom Accessibility: No Does the patient have any problems obtaining your medications?: Yes (Describe) (recently lost insurance which resulted in this hospital stay)  Social/Family/Support Systems Patient Roles: Spouse Anticipated Caregiver: Spouse: Russell Price Anticipated Ambulance person Information: [phone] Ability/Limitations of Caregiver: None Caregiver Availability: 24/7 Discharge Plan Discussed with Primary Caregiver: Yes Is Caregiver In Agreement with Plan?: Yes Does Caregiver/Family have Issues with Lodging/Transportation while Pt is in Rehab?: No  Goals/Additional Needs Patient/Family Goal for Rehab: PT/OT/SLP Supervision  Expected length of stay: 10-14 days Cultural Considerations: Patient's first language is Arabic; however, they have declined a Optometrist  Dietary Needs: No: Pork, [place], Beef or Gelatin Equipment Needs: TBD Special Service Needs: Patient needs diabetic education  Additional Information: Only eats meats: chicken and fish Pt/Family Agrees to Admission and willing to participate: Yes Program Orientation Provided & Reviewed with Pt/Caregiver Including Roles & Responsibilities: Yes Additional Information Needs: Patient needs to be contected to a PCP, assistance with medications, financial counseling and diabetes education  Information Needs to be Provided By: CSW and RN  Decrease burden of Care through IP rehab admission: No  Possible need for SNF placement upon discharge: Not anticipated   Patient Condition: This patient's condition remains as documented in the consult dated 09/07/15, in which the Rehabilitation Physician determined and documented that the patient's condition is appropriate for intensive rehabilitative care in an inpatient rehabilitation facility. Will admit to inpatient rehab today.  Preadmission  Screen Completed By: Russell Price, 09/08/2015 12:31 PM ______________________________________________________________________  Discussed status with Dr. Naaman Plummer on 09/08/15 at 1238 and received telephone approval for admission today.  Admission Coordinator: Russell Price, time 1238/Date 09/08/15          Cosigned by: Meredith Staggers, MD at 09/08/2015 12:41 PM  Revision History  Date/Time User Provider Type Action   09/08/2015 12:41 PM Meredith Staggers, MD Physician Cosign   09/08/2015 12:39 PM Russell Price Rehab Admission Coordinator Sign

## 2015-09-08 NOTE — Progress Notes (Signed)
Inpatient Rehabilitation  Patient is making good gains in therapy, but PT is continuing to recommend CIR follow up as well as an OT consult to address left sided weakness.  PT shared recommendations with patient and spouse who agree to proceed with IP Rehab admission today.  Updated RN CM.  Please call with questions.  Carmelia Roller., CCC/SLP Admission Coordinator  [place]  Cell [phone]

## 2015-09-08 NOTE — Progress Notes (Addendum)
Physical Therapy Treatment Patient Details Name: Russell Price MRN: [id number] DOB: Aug 14, 1960 Today's Date: 09/08/2015    History of Present Illness pt presents after being found down with DKA, Encephalopathy, and Seizure activity.  pt was intubated 4/15 - 4/18. pt with hx of CKD, DM, and R eye blindness.      PT Comments    Pt required decreased assist with mobility and gait training.  Pt remains to present with L side weakness and coordination deficits.  Pt required bracing of L wrist during gait training, and assist to turn RW to L.  Pt independent prior to admission and would benefit from Inpatient Rehab to address deficits and improve overall function for safe return home.    Follow Up Recommendations  CIR     Equipment Recommendations  None recommended by PT    Recommendations for Other Services Rehab consult;OT consult     Precautions / Restrictions Precautions Precautions: Fall Restrictions Weight Bearing Restrictions: No    Mobility  Bed Mobility Overal bed mobility: Needs Assistance Bed Mobility: Supine to Sit     Supine to sit: Min assist     General bed mobility comments: Pt required cues for B LEs advance  and min assist to elevate trunk into sitting.    Transfers Overall transfer level: Needs assistance Equipment used: Rolling walker (2 wheeled) Transfers: Sit to/from Stand Sit to Stand: Min assist;+2 safety/equipment         General transfer comment: Pt required cues to push from seated surface.  Pt pushed with L hand but minimal effort noted requiring assist to boost.  pt placed R hand on RW during transition.    Ambulation/Gait Ambulation/Gait assistance: Min assist Ambulation Distance (Feet): 150 Feet Assistive device: Rolling walker (2 wheeled) Gait Pattern/deviations: Step-through pattern;Decreased stride length;Decreased step length - left;Trunk flexed   Gait velocity interpretation: Below normal speed for age/gender General Gait  Details: Pt required bracing of L wrist on RW.  Pt required cues to increase L stride length and L foot clearance.  Pt unable to turn left requiring mod assist to turn walker L due to weakness on L side.  Coordination and strength deficits limit progress.     Stairs            Wheelchair Mobility    Modified Rankin (Stroke Patients Only)       Balance Overall balance assessment: Needs assistance   Sitting balance-Leahy Scale: Fair       Standing balance-Leahy Scale: Poor                      Cognition Arousal/Alertness: Awake/alert Behavior During Therapy: WFL for tasks assessed/performed Overall Cognitive Status: Difficult to assess                      Exercises General Exercises - Lower Extremity Long Arc Quad: AAROM;Left;15 reps Heel Slides: AAROM;Left;15 reps Hip ABduction/ADduction: Left;15 reps;AAROM Straight Leg Raises: AAROM;Left;15 reps Hip Flexion/Marching: AAROM;Left;15 reps Shoulder Exercises Elbow Flexion: AROM;Left;10 reps (with water bottle.) Elbow Extension: AROM;Left;10 reps Hand Exercises Wrist Flexion: AAROM;Left;10 reps Wrist Extension: AAROM;Left;10 reps Digit Composite Flexion: AROM;Left;10 reps    General Comments        Pertinent Vitals/Pain Pain Assessment: No/denies pain    Home Living                      Prior Function  PT Goals (current goals can now be found in the care plan section) Acute Rehab PT Goals Patient Stated Goal: Per wife to get back to normal Potential to Achieve Goals: Good Progress towards PT goals: Progressing toward goals    Frequency  Min 3X/week    PT Plan Current plan remains appropriate    Co-evaluation             End of Session Equipment Utilized During Treatment: Gait belt Activity Tolerance: Patient tolerated treatment well Patient left: with call bell/phone within reach;with family/visitor present;in chair;with chair alarm set     Time:  [phone] PT Time Calculation (min) (ACUTE ONLY): 19 min  Charges:  $Gait Training: 8-22 mins                    G Codes:      Cristela Blue Sep 13, 2015, 11:46 AM  Governor Rooks, PTA pager [phone]

## 2015-09-08 NOTE — H&P (View-Only) (Signed)
Physical Medicine and Rehabilitation Admission H&P    Chief Complaint  Patient presents with  . Seizures  . Altered Mental Status  . Fall  : HPI: Russell Price is a 55 y.o. right handed male with history of chronic renal insufficiency with baseline creatinine 3.95, uncontrolled diabetes mellitus, hypertension, right eye blindness. By chart review patient is married and independent prior to admission. 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 Presented 09/02/2015 with increasing fatigue and weakness over the past few days and later found unresponsive by wife. Noted 2 lacerations on his head. There was concern for possible seizure. He was intubated for airway protection.Elevated troponin 1.22-1.36-0.95 felt to be related to demand ischemia and no cardiology workup advised. Cranial CT scan cervical spine films negative. MRI of the brain negative for acute intracranial process And incidental noting of sphenoid sinusitis. 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 Patient did not receive TPA. Echocardiogram with ejection fraction of 60% no wall motion abnormalities without PFO. EEG to generalized background slowing indicating of diffuse cerebral dysfunction. No seizure activity. Patient was extubated 09/05/2015. Subcutaneous heparin for DVT prophylaxis. Currently on a mechanical soft diet. Physical therapy evaluation completed 09/06/2015 with recommendations of physical medicine rehabilitation consult.Patient was admitted for a comprehensive rehabilitation program  ROS Constitutional: Positive for malaise/fatigue. Negative for fever and chills.  HENT: Negative for hearing loss.  Eyes:   Blindness to right eye  Respiratory: Negative for cough and shortness of breath.  Cardiovascular:  Negative for palpitations and leg swelling.  Gastrointestinal: Positive for constipation. Negative for nausea and vomiting.  Genitourinary: Negative for dysuria and hematuria.  Musculoskeletal: Positive for myalgias.  Neurological: Positive for focal weakness, weakness and headaches.  All other systems reviewed and are negative   Past Medical History  Diagnosis Date  . Diabetes mellitus without complication ([place])     followed by Dr Chalmers Cater  . Hypertension     followed by [facility]  . Chronic kidney disease    Past Surgical History  Procedure Laterality Date  . Bronchoscopy  02/14/2015    for pulm hemorrhage   Family History  Problem Relation Age of Onset  . Diabetes Mother    Social History:  reports that he quit smoking about 32 years ago. He does not have any smokeless tobacco history on file. He reports that he does not drink alcohol or use illicit drugs. Allergies: No Known Allergies Facility-administered medications prior to admission  Medication Dose Route Frequency Provider Last Rate Last Dose  . insulin aspart (novoLOG) injection 20 Units  20 Units Subcutaneous Once Posey Boyer, MD       Medications Prior to Admission  Medication Sig Dispense Refill  . amLODipine (NORVASC) 10 MG tablet Take 1 tablet (10 mg total) by mouth daily. Stop lisinopril . 30 tablet 11  . aspirin EC 325 MG tablet Take 1 tablet (325 mg total) by mouth daily. 30 tablet 0  . atorvastatin (LIPITOR) 40 MG tablet Take 1 tablet (40 mg total) by mouth daily at 6 PM. 30 tablet 11  . carvedilol (COREG) 6.25 MG tablet Take 1 tablet (6.25 mg total) by mouth 2 (two) times daily with a meal. 90 tablet 11  . glipiZIDE (GLUCOTROL) 5 MG tablet Take 2 pills in the morning and one  in the evening for diabetes 90 tablet 11  . Insulin Glargine (LANTUS SOLOSTAR) 100 UNIT/ML Solostar Pen Inject 60 units subcutaneously at bedtime. 15 mL 12    Home: Home Living Family/patient expects to be discharged  to:: Inpatient rehab Living Arrangements: Spouse/significant other, Other relatives   Functional History: Prior Function Level of Independence: Independent Comments: Per wife pt was laid off, but worked as an Engineer, 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  Functional Status:  Mobility: Bed Mobility Overal bed mobility: Needs Assistance, +2 for physical assistance Bed Mobility: Supine to Sit, Sit to Supine Supine to sit: Mod assist, +2 for physical assistance Sit to supine: Mod assist, +2 for physical assistance General bed mobility comments: pt needs A for Bil LEs and bringing trunk up to sitting.  He does attempt to A with mobility.   Transfers Overall transfer level: Needs assistance Equipment used: 2 person hand held assist Transfers: Sit to/from Stand Sit to Stand: Max assist, +2 physical assistance General transfer comment: pt needs LEs placed on floor and L LE blocked.  L knee hyperextends in standing and attempts at weightbearing require L knee to be blocked to prevent buckling.        ADL:    Cognition: Cognition Overall Cognitive Status: Difficult to assess Orientation Level: Oriented X4 Cognition Arousal/Alertness: Awake/alert Behavior During Therapy: WFL for tasks assessed/performed Overall Cognitive Status: Difficult to assess Difficult to assess due to: Non-English speaking (Answers some questions, but very flat.)   Physical Exam: Blood pressure 169/71, pulse 85, temperature 98.5 F (36.9 C), temperature source Oral, resp. rate 18, height '5\' 10"'$  (1.778 m), weight 77.52 kg (170 lb 14.4 oz), SpO2 100 %. Physical Exam Constitutional: He is oriented to person, place, and time. He appears well-developed.  HENT:  Head: Normocephalic.  Eyes: EOM are normal.  Neck: Normal range of motion. Neck supple. No thyromegaly present.  Cardiovascular: Normal rate and regular rhythm.  Respiratory: Effort normal and breath sounds normal. No respiratory distress.  GI:  Soft. Bowel sounds are normal. He exhibits no distension.  Neurological: He is alert and oriented to person, place, and time.  Mood is flat but appropriate. He answers basic questions appropriately. Follows simple commands.   Skin: Skin is warm and dry.  Motor strength is 4+/5 right deltoid, biceps, triceps, grip, hip flexor, knee extensor, ankle dorsiflexor. He appeared to be 4/5 left deltoid, biceps, triceps, fingers/wrist. 4 to 4+ Left HF, KE and ADF/PF Senses pain and light touch in all 4 limbs.  No swelling in the lower extremities. He does have mild dorsal hand edema in the left upper extremity   Results for orders placed or performed during the hospital encounter of 09/02/15 (from the past 48 hour(s))  Glucose, capillary     Status: Abnormal   Collection Time: 09/06/15  7:05 AM  Result Value Ref Range   Glucose-Capillary 168 (H) 65 - 99 mg/dL  Glucose, capillary     Status: Abnormal   Collection Time: 09/06/15  8:05 AM  Result Value Ref Range   Glucose-Capillary 163 (H) 65 - 99 mg/dL  Glucose, capillary     Status: Abnormal   Collection Time: 09/06/15  9:12 AM  Result Value Ref Range   Glucose-Capillary 173 (H) 65 - 99 mg/dL  Glucose, capillary     Status: Abnormal   Collection Time: 09/06/15 10:22 AM  Result Value Ref Range   Glucose-Capillary 166 (H) 65 - 99 mg/dL  Glucose, capillary     Status: Abnormal  Collection Time: 09/06/15 12:02 PM  Result Value Ref Range   Glucose-Capillary 165 (H) 65 - 99 mg/dL  Glucose, capillary     Status: Abnormal   Collection Time: 09/06/15  3:52 PM  Result Value Ref Range   Glucose-Capillary 189 (H) 65 - 99 mg/dL  Glucose, capillary     Status: Abnormal   Collection Time: 09/06/15 11:39 PM  Result Value Ref Range   Glucose-Capillary 198 (H) 65 - 99 mg/dL  Basic metabolic panel     Status: Abnormal   Collection Time: 09/07/15  3:48 AM  Result Value Ref Range   Sodium 151 (H) 135 - 145 mmol/L   Potassium 4.1 3.5 - 5.1 mmol/L    Chloride 115 (H) 101 - 111 mmol/L   CO2 21 (L) 22 - 32 mmol/L   Glucose, Bld 175 (H) 65 - 99 mg/dL   BUN 38 (H) 6 - 20 mg/dL   Creatinine, Ser 3.71 (H) 0.61 - 1.24 mg/dL   Calcium 9.0 8.9 - 10.3 mg/dL   GFR calc non Af Amer 17 (L) >60 mL/min   GFR calc Af Amer 19 (L) >60 mL/min    Comment: (NOTE) The eGFR has been calculated using the CKD EPI equation. This calculation has not been validated in all clinical situations. eGFR's persistently <60 mL/min signify possible Chronic Kidney Disease.    Anion gap 15 5 - 15  Glucose, capillary     Status: Abnormal   Collection Time: 09/07/15  4:00 AM  Result Value Ref Range   Glucose-Capillary 154 (H) 65 - 99 mg/dL  Glucose, capillary     Status: Abnormal   Collection Time: 09/07/15  8:16 AM  Result Value Ref Range   Glucose-Capillary 131 (H) 65 - 99 mg/dL  Glucose, capillary     Status: Abnormal   Collection Time: 09/07/15 12:05 PM  Result Value Ref Range   Glucose-Capillary 292 (H) 65 - 99 mg/dL  Glucose, capillary     Status: Abnormal   Collection Time: 09/07/15  4:19 PM  Result Value Ref Range   Glucose-Capillary 225 (H) 65 - 99 mg/dL  Glucose, capillary     Status: Abnormal   Collection Time: 09/07/15  7:46 PM  Result Value Ref Range   Glucose-Capillary 234 (H) 65 - 99 mg/dL   Comment 1 Notify RN   Glucose, capillary     Status: Abnormal   Collection Time: 09/08/15 12:00 AM  Result Value Ref Range   Glucose-Capillary 147 (H) 65 - 99 mg/dL   Comment 1 Notify RN   Glucose, capillary     Status: Abnormal   Collection Time: 09/08/15  4:10 AM  Result Value Ref Range   Glucose-Capillary 138 (H) 65 - 99 mg/dL   Comment 1 Notify RN    Mr Brain Wo Contrast  09/07/2015  CLINICAL DATA:  Hyperglycemia and acute encephalopathy. LEFT arm weakness today after discontinuing sedation yesterday. History of diabetes and hypertension. EXAM: MRI HEAD WITHOUT CONTRAST TECHNIQUE: Multiplanar, multiecho pulse sequences of the brain and surrounding  structures were obtained without intravenous contrast. COMPARISON:  MRI/MRA of the head September 02, 2015 FINDINGS: INTRACRANIAL CONTENTS: No reduced diffusion to suggest acute ischemia. Scratch The ventricles and sulci are normal for patient's age. Old RIGHT thalamus infarct. Old LEFT globus pallidus lacunar infarct. LEFT periatrial probable developmental venous anomaly/perivascular space. No suspicious parenchymal signal, mass lesions, mass effect. Again noted is susceptibility artifact within the vermis which could represent cavernoma though is nonspecific. No abnormal extra-axial fluid  collections. No extra-axial masses though, contrast enhanced sequences would be more sensitive. Normal major intracranial vascular flow voids present at skull base. ORBITS: The included ocular globes and orbital contents are non-suspicious. Status post RIGHT ocular lens implant. SINUSES: Soft tissue opacifies the sphenoid sinuses with reduced diffusion. Trace mastoid effusions. SKULL/SOFT TISSUES: No abnormal sellar expansion. No suspicious calvarial bone marrow signal. Craniocervical junction maintained. IMPRESSION: No acute intracranial process, specifically no acute ischemia. 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 New sphenoid sinusitis, possible fungal component. Electronically Signed   By: Elon Alas M.D.   On: 09/07/2015 02:38       Medical Problem List and Plan: 1. Debilitation with acute encephalopathy  secondary to DKA 2.  DVT Prophylaxis/Anticoagulation: Subcutaneous heparin. Monitor platelet counts of any signs of bleeding 3. Pain Management: Tylenol as needed 4. Diabetes mellitus of peripheral neuropathy. Latest hemoglobin A1c 8.5. Lantus insulin 20 units daily. Check blood sugars before meals and at bedtime. Diabetic teaching 5. Neuropsych: This patient is capable of making decisions on his own behalf. 6. Skin/Wound Care: Routine skin checks 7. Fluids/Electrolytes/Nutrition: Routine I&O  with follow-up chemistries upon admission 8. Question seizure. Provoked from hyperglycemia. No seizure medications indicated per neurology services. EEG negative 9. Hypertension. Norvasc 10 mg daily, Coreg 6.25 mg twice a day. Monitor with increased mobility 10. Chronic renal insufficiency. Baseline creatinine 3. 95. Follow-up chemistries. 11. Sphenoid sinusitis identified on MRI. Diflucan 7 days.   Post Admission Physician Evaluation: 1. Functional deficits secondary  to DKA, multiple medical. 2. Patient is admitted to receive collaborative, interdisciplinary care between the physiatrist, rehab nursing staff, and therapy team. 3. Patient's level of medical complexity and substantial therapy needs in context of that medical necessity cannot be provided at a lesser intensity of care such as a SNF. 4. Patient has experienced substantial functional loss from his/her baseline which was documented above under the "Functional History" and "Functional Status" headings.  Judging by the patient's diagnosis, physical exam, and functional history, the patient has potential for functional progress which will result in measurable gains while on inpatient rehab.  These gains will be of substantial and practical use upon discharge  in facilitating mobility and self-care at the household level. 5. Physiatrist will provide 24 hour management of medical needs as well as oversight of the therapy plan/treatment and provide guidance as appropriate regarding the interaction of the two. 6. 24 hour rehab nursing will assist with bladder management, bowel management, safety, skin/wound care, disease management, medication administration, pain management and patient education  and help integrate therapy concepts, techniques,education, etc. 7. PT will assess and treat for/with: Lower extremity strength, range of motion, stamina, balance, functional mobility, safety, adaptive techniques and equipment, NMR, cognitive perceptual  awareness, pain mgt, family education.   Goals are: mod I to supervision. 8. OT will assess and treat for/with: ADL's, functional mobility, safety, upper extremity strength, adaptive techniques and equipment, NMR, cognitive perceptual awareness, ego support, community reintegration.   Goals are: mod I to supervision. Therapy may proceed with showering this patient. 9. SLP will assess and treat for/with: cognition, comunication, education.  Goals are: mod I to supervision. 10. Case Management and Social Worker will assess and treat for psychological issues and discharge planning. 11. Team conference will be held weekly to assess progress toward goals and to determine barriers to discharge. 12. Patient will receive at least 3 hours of therapy per day at least 5 days per week. 13. ELOS: 13-16 day  14. Prognosis:  good     Meredith Staggers, MD, [facility] 09/08/2015   09/08/2015

## 2015-09-08 NOTE — H&P (Signed)
Physical Medicine and Rehabilitation Admission H&P    Chief Complaint  Patient presents with  . Seizures  . Altered Mental Status  . Fall  : HPI: Russell Price is a 55 y.o. right handed male with history of chronic renal insufficiency with baseline creatinine 3.95, uncontrolled diabetes mellitus, hypertension, right eye blindness. By chart review patient is married and independent prior to admission. 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 Presented 09/02/2015 with increasing fatigue and weakness over the past few days and later found unresponsive by wife. Noted 2 lacerations on his head. There was concern for possible seizure. He was intubated for airway protection.Elevated troponin 1.22-1.36-0.95 felt to be related to demand ischemia and no cardiology workup advised. Cranial CT scan cervical spine films negative. MRI of the brain negative for acute intracranial process And incidental noting of sphenoid sinusitis. 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 Patient did not receive TPA. Echocardiogram with ejection fraction of 60% no wall motion abnormalities without PFO. EEG to generalized background slowing indicating of diffuse cerebral dysfunction. No seizure activity. Patient was extubated 09/05/2015. Subcutaneous heparin for DVT prophylaxis. Currently on a mechanical soft diet. Physical therapy evaluation completed 09/06/2015 with recommendations of physical medicine rehabilitation consult.Patient was admitted for a comprehensive rehabilitation program  ROS Constitutional: Positive for malaise/fatigue. Negative for fever and chills.  HENT: Negative for hearing loss.  Eyes:   Blindness to right eye  Respiratory: Negative for cough and shortness of breath.  Cardiovascular:  Negative for palpitations and leg swelling.  Gastrointestinal: Positive for constipation. Negative for nausea and vomiting.  Genitourinary: Negative for dysuria and hematuria.  Musculoskeletal: Positive for myalgias.  Neurological: Positive for focal weakness, weakness and headaches.  All other systems reviewed and are negative   Past Medical History  Diagnosis Date  . Diabetes mellitus without complication ([place])     followed by Dr Chalmers Cater  . Hypertension     followed by [facility]  . Chronic kidney disease    Past Surgical History  Procedure Laterality Date  . Bronchoscopy  02/14/2015    for pulm hemorrhage   Family History  Problem Relation Age of Onset  . Diabetes Mother    Social History:  reports that he quit smoking about 32 years ago. He does not have any smokeless tobacco history on file. He reports that he does not drink alcohol or use illicit drugs. Allergies: No Known Allergies Facility-administered medications prior to admission  Medication Dose Route Frequency Provider Last Rate Last Dose  . insulin aspart (novoLOG) injection 20 Units  20 Units Subcutaneous Once Posey Boyer, MD       Medications Prior to Admission  Medication Sig Dispense Refill  . amLODipine (NORVASC) 10 MG tablet Take 1 tablet (10 mg total) by mouth daily. Stop lisinopril . 30 tablet 11  . aspirin EC 325 MG tablet Take 1 tablet (325 mg total) by mouth daily. 30 tablet 0  . atorvastatin (LIPITOR) 40 MG tablet Take 1 tablet (40 mg total) by mouth daily at 6 PM. 30 tablet 11  . carvedilol (COREG) 6.25 MG tablet Take 1 tablet (6.25 mg total) by mouth 2 (two) times daily with a meal. 90 tablet 11  . glipiZIDE (GLUCOTROL) 5 MG tablet Take 2 pills in the morning and one  in the evening for diabetes 90 tablet 11  . Insulin Glargine (LANTUS SOLOSTAR) 100 UNIT/ML Solostar Pen Inject 60 units subcutaneously at bedtime. 15 mL 12    Home: Home Living Family/patient expects to be discharged  to:: Inpatient rehab Living Arrangements: Spouse/significant other, Other relatives   Functional History: Prior Function Level of Independence: Independent Comments: Per wife pt was laid off, but worked as an Engineer, 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  Functional Status:  Mobility: Bed Mobility Overal bed mobility: Needs Assistance, +2 for physical assistance Bed Mobility: Supine to Sit, Sit to Supine Supine to sit: Mod assist, +2 for physical assistance Sit to supine: Mod assist, +2 for physical assistance General bed mobility comments: pt needs A for Bil LEs and bringing trunk up to sitting.  He does attempt to A with mobility.   Transfers Overall transfer level: Needs assistance Equipment used: 2 person hand held assist Transfers: Sit to/from Stand Sit to Stand: Max assist, +2 physical assistance General transfer comment: pt needs LEs placed on floor and L LE blocked.  L knee hyperextends in standing and attempts at weightbearing require L knee to be blocked to prevent buckling.        ADL:    Cognition: Cognition Overall Cognitive Status: Difficult to assess Orientation Level: Oriented X4 Cognition Arousal/Alertness: Awake/alert Behavior During Therapy: WFL for tasks assessed/performed Overall Cognitive Status: Difficult to assess Difficult to assess due to: Non-English speaking (Answers some questions, but very flat.)   Physical Exam: Blood pressure 169/71, pulse 85, temperature 98.5 F (36.9 C), temperature source Oral, resp. rate 18, height '5\' 10"'$  (1.778 m), weight 77.52 kg (170 lb 14.4 oz), SpO2 100 %. Physical Exam Constitutional: He is oriented to person, place, and time. He appears well-developed.  HENT:  Head: Normocephalic.  Eyes: EOM are normal.  Neck: Normal range of motion. Neck supple. No thyromegaly present.  Cardiovascular: Normal rate and regular rhythm.  Respiratory: Effort normal and breath sounds normal. No respiratory distress.  GI:  Soft. Bowel sounds are normal. He exhibits no distension.  Neurological: He is alert and oriented to person, place, and time.  Mood is flat but appropriate. He answers basic questions appropriately. Follows simple commands.   Skin: Skin is warm and dry.  Motor strength is 4+/5 right deltoid, biceps, triceps, grip, hip flexor, knee extensor, ankle dorsiflexor. He appeared to be 4/5 left deltoid, biceps, triceps, fingers/wrist. 4 to 4+ Left HF, KE and ADF/PF Senses pain and light touch in all 4 limbs.  No swelling in the lower extremities. He does have mild dorsal hand edema in the left upper extremity   Results for orders placed or performed during the hospital encounter of 09/02/15 (from the past 48 hour(s))  Glucose, capillary     Status: Abnormal   Collection Time: 09/06/15  7:05 AM  Result Value Ref Range   Glucose-Capillary 168 (H) 65 - 99 mg/dL  Glucose, capillary     Status: Abnormal   Collection Time: 09/06/15  8:05 AM  Result Value Ref Range   Glucose-Capillary 163 (H) 65 - 99 mg/dL  Glucose, capillary     Status: Abnormal   Collection Time: 09/06/15  9:12 AM  Result Value Ref Range   Glucose-Capillary 173 (H) 65 - 99 mg/dL  Glucose, capillary     Status: Abnormal   Collection Time: 09/06/15 10:22 AM  Result Value Ref Range   Glucose-Capillary 166 (H) 65 - 99 mg/dL  Glucose, capillary     Status: Abnormal  Collection Time: 09/06/15 12:02 PM  Result Value Ref Range   Glucose-Capillary 165 (H) 65 - 99 mg/dL  Glucose, capillary     Status: Abnormal   Collection Time: 09/06/15  3:52 PM  Result Value Ref Range   Glucose-Capillary 189 (H) 65 - 99 mg/dL  Glucose, capillary     Status: Abnormal   Collection Time: 09/06/15 11:39 PM  Result Value Ref Range   Glucose-Capillary 198 (H) 65 - 99 mg/dL  Basic metabolic panel     Status: Abnormal   Collection Time: 09/07/15  3:48 AM  Result Value Ref Range   Sodium 151 (H) 135 - 145 mmol/L   Potassium 4.1 3.5 - 5.1 mmol/L    Chloride 115 (H) 101 - 111 mmol/L   CO2 21 (L) 22 - 32 mmol/L   Glucose, Bld 175 (H) 65 - 99 mg/dL   BUN 38 (H) 6 - 20 mg/dL   Creatinine, Ser 3.71 (H) 0.61 - 1.24 mg/dL   Calcium 9.0 8.9 - 10.3 mg/dL   GFR calc non Af Amer 17 (L) >60 mL/min   GFR calc Af Amer 19 (L) >60 mL/min    Comment: (NOTE) The eGFR has been calculated using the CKD EPI equation. This calculation has not been validated in all clinical situations. eGFR's persistently <60 mL/min signify possible Chronic Kidney Disease.    Anion gap 15 5 - 15  Glucose, capillary     Status: Abnormal   Collection Time: 09/07/15  4:00 AM  Result Value Ref Range   Glucose-Capillary 154 (H) 65 - 99 mg/dL  Glucose, capillary     Status: Abnormal   Collection Time: 09/07/15  8:16 AM  Result Value Ref Range   Glucose-Capillary 131 (H) 65 - 99 mg/dL  Glucose, capillary     Status: Abnormal   Collection Time: 09/07/15 12:05 PM  Result Value Ref Range   Glucose-Capillary 292 (H) 65 - 99 mg/dL  Glucose, capillary     Status: Abnormal   Collection Time: 09/07/15  4:19 PM  Result Value Ref Range   Glucose-Capillary 225 (H) 65 - 99 mg/dL  Glucose, capillary     Status: Abnormal   Collection Time: 09/07/15  7:46 PM  Result Value Ref Range   Glucose-Capillary 234 (H) 65 - 99 mg/dL   Comment 1 Notify RN   Glucose, capillary     Status: Abnormal   Collection Time: 09/08/15 12:00 AM  Result Value Ref Range   Glucose-Capillary 147 (H) 65 - 99 mg/dL   Comment 1 Notify RN   Glucose, capillary     Status: Abnormal   Collection Time: 09/08/15  4:10 AM  Result Value Ref Range   Glucose-Capillary 138 (H) 65 - 99 mg/dL   Comment 1 Notify RN    Mr Brain Wo Contrast  09/07/2015  CLINICAL DATA:  Hyperglycemia and acute encephalopathy. LEFT arm weakness today after discontinuing sedation yesterday. History of diabetes and hypertension. EXAM: MRI HEAD WITHOUT CONTRAST TECHNIQUE: Multiplanar, multiecho pulse sequences of the brain and surrounding  structures were obtained without intravenous contrast. COMPARISON:  MRI/MRA of the head September 02, 2015 FINDINGS: INTRACRANIAL CONTENTS: No reduced diffusion to suggest acute ischemia. Scratch The ventricles and sulci are normal for patient's age. Old RIGHT thalamus infarct. Old LEFT globus pallidus lacunar infarct. LEFT periatrial probable developmental venous anomaly/perivascular space. No suspicious parenchymal signal, mass lesions, mass effect. Again noted is susceptibility artifact within the vermis which could represent cavernoma though is nonspecific. No abnormal extra-axial fluid  collections. No extra-axial masses though, contrast enhanced sequences would be more sensitive. Normal major intracranial vascular flow voids present at skull base. ORBITS: The included ocular globes and orbital contents are non-suspicious. Status post RIGHT ocular lens implant. SINUSES: Soft tissue opacifies the sphenoid sinuses with reduced diffusion. Trace mastoid effusions. SKULL/SOFT TISSUES: No abnormal sellar expansion. No suspicious calvarial bone marrow signal. Craniocervical junction maintained. IMPRESSION: No acute intracranial process, specifically no acute ischemia. 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 New sphenoid sinusitis, possible fungal component. Electronically Signed   By: Elon Alas M.D.   On: 09/07/2015 02:38       Medical Problem List and Plan: 1. Debilitation with acute encephalopathy  secondary to DKA 2.  DVT Prophylaxis/Anticoagulation: Subcutaneous heparin. Monitor platelet counts of any signs of bleeding 3. Pain Management: Tylenol as needed 4. Diabetes mellitus of peripheral neuropathy. Latest hemoglobin A1c 8.5. Lantus insulin 20 units daily. Check blood sugars before meals and at bedtime. Diabetic teaching 5. Neuropsych: This patient is capable of making decisions on his own behalf. 6. Skin/Wound Care: Routine skin checks 7. Fluids/Electrolytes/Nutrition: Routine I&O  with follow-up chemistries upon admission 8. Question seizure. Provoked from hyperglycemia. No seizure medications indicated per neurology services. EEG negative 9. Hypertension. Norvasc 10 mg daily, Coreg 6.25 mg twice a day. Monitor with increased mobility 10. Chronic renal insufficiency. Baseline creatinine 3. 95. Follow-up chemistries. 11. Sphenoid sinusitis identified on MRI. Diflucan 7 days.   Post Admission Physician Evaluation: 1. Functional deficits secondary  to DKA, multiple medical. 2. Patient is admitted to receive collaborative, interdisciplinary care between the physiatrist, rehab nursing staff, and therapy team. 3. Patient's level of medical complexity and substantial therapy needs in context of that medical necessity cannot be provided at a lesser intensity of care such as a SNF. 4. Patient has experienced substantial functional loss from his/her baseline which was documented above under the "Functional History" and "Functional Status" headings.  Judging by the patient's diagnosis, physical exam, and functional history, the patient has potential for functional progress which will result in measurable gains while on inpatient rehab.  These gains will be of substantial and practical use upon discharge  in facilitating mobility and self-care at the household level. 5. Physiatrist will provide 24 hour management of medical needs as well as oversight of the therapy plan/treatment and provide guidance as appropriate regarding the interaction of the two. 6. 24 hour rehab nursing will assist with bladder management, bowel management, safety, skin/wound care, disease management, medication administration, pain management and patient education  and help integrate therapy concepts, techniques,education, etc. 7. PT will assess and treat for/with: Lower extremity strength, range of motion, stamina, balance, functional mobility, safety, adaptive techniques and equipment, NMR, cognitive perceptual  awareness, pain mgt, family education.   Goals are: mod I to supervision. 8. OT will assess and treat for/with: ADL's, functional mobility, safety, upper extremity strength, adaptive techniques and equipment, NMR, cognitive perceptual awareness, ego support, community reintegration.   Goals are: mod I to supervision. Therapy may proceed with showering this patient. 9. SLP will assess and treat for/with: cognition, comunication, education.  Goals are: mod I to supervision. 10. Case Management and Social Worker will assess and treat for psychological issues and discharge planning. 11. Team conference will be held weekly to assess progress toward goals and to determine barriers to discharge. 12. Patient will receive at least 3 hours of therapy per day at least 5 days per week. 13. ELOS: 13-16 day  14. Prognosis:  good     Meredith Staggers, MD, [facility] 09/08/2015   09/08/2015

## 2015-09-08 NOTE — Progress Notes (Signed)
Inpatient Rehabilitation  Received medical clearance this am and have a bed available with plan to admit later today.  However, upon arrival to patient's room this am.  Wife reported that patient walked so well with PT this morning that they would prefer to go home (PT note not present in chart yet to verify).  I have reached out to Aimee assigned PTA and await her phone call.  Notified RN CM and will notify MD after PT update.  Please call with questions.   Carmelia Roller., CCC/SLP Admission Coordinator  [place]  Cell [phone]

## 2015-09-08 NOTE — Progress Notes (Signed)
Charlett Blake, MD Physician Signed Physical Medicine and Rehabilitation Consult Note 09/07/2015 9:44 AM  Related encounter: ED to Hosp-Admission (Current) from 09/02/2015 in [place] Collapse All        Physical Medicine and Rehabilitation Consult Reason for Consult: Acute encephalopathy/severe DKA Referring Physician: [facility]   HPI: Russell Price is a 56 y.o. right handed male with history of chronic renal insufficiency with baseline creatinine 3.95, uncontrolled diabetes mellitus, hypertension, right eye blindness. By chart review patient is married and independent prior to admission. 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 Presented 09/02/2015 with increasing fatigue and weakness over the past few days and later found unresponsive by wife. Noted 2 lacerations on his head. There was concern for possible seizure. He was intubated for airway protection. Cranial CT scan cervical spine films negative. MRI of the brain negative. 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 Patient did not receive TPA. Echocardiogram with ejection fraction of 60% no wall motion abnormalities without PFO. EEG to generalized background slowing indicating of diffuse cerebral dysfunction. No seizure activity. Patient was extubated 09/05/2015. Subcutaneous heparin for DVT prophylaxis. Currently on a mechanical soft diet. Physical therapy evaluation completed 09/06/2015 with recommendations of physical medicine rehabilitation consult.  Wife is at bedside. She does more state of the talking. Her English is fair. Apparently he was working until last September when he was laid off. He has been independent with all his self-care and mobility and was still driving a car  up until a couple days prior to admission Review of Systems  Constitutional: Positive for malaise/fatigue. Negative for fever and chills.  HENT: Negative for hearing loss.  Eyes:   Blindness to right eye  Respiratory: Negative for cough and shortness of breath.  Cardiovascular: Negative for palpitations and leg swelling.  Gastrointestinal: Positive for constipation. Negative for nausea and vomiting.  Genitourinary: Negative for dysuria and hematuria.  Musculoskeletal: Positive for myalgias.  Neurological: Positive for focal weakness, weakness and headaches.  All other systems reviewed and are negative.  Past Medical History  Diagnosis Date  . Diabetes mellitus without complication ([place])     followed by Dr Chalmers Cater  . Hypertension     followed by [facility]  . Chronic kidney disease    Past Surgical History  Procedure Laterality Date  . Bronchoscopy  02/14/2015    for pulm hemorrhage   Family History  Problem Relation Age of Onset  . Diabetes Mother    Social History:  reports that he quit smoking about 32 years ago. He does not have any smokeless tobacco history on file. He reports that he does not drink alcohol or use illicit drugs. Allergies: No Known Allergies Facility-administered medications prior to admission  Medication Dose Route Frequency Provider Last Rate Last Dose  . insulin aspart (novoLOG) injection 20 Units 20 Units Subcutaneous Once Posey Boyer, MD     Medications Prior to Admission  Medication Sig Dispense Refill  . amLODipine (NORVASC) 10 MG tablet Take 1 tablet (10 mg total) by mouth daily. Stop lisinopril . 30 tablet 11  . aspirin EC 325 MG tablet Take 1 tablet (325 mg total) by mouth daily. 30 tablet 0  . atorvastatin (LIPITOR) 40 MG tablet Take 1 tablet (40  mg total) by mouth daily at 6 PM. 30 tablet 11  . carvedilol (COREG) 6.25 MG tablet Take 1 tablet  (6.25 mg total) by mouth 2 (two) times daily with a meal. 90 tablet 11  . glipiZIDE (GLUCOTROL) 5 MG tablet Take 2 pills in the morning and one in the evening for diabetes 90 tablet 11  . Insulin Glargine (LANTUS SOLOSTAR) 100 UNIT/ML Solostar Pen Inject 60 units subcutaneously at bedtime. 15 mL 12    Home: Home Living Family/patient expects to be discharged to:: Inpatient rehab Living Arrangements: Spouse/significant other, Other relatives  Functional History: Prior Function Level of Independence: Independent Comments: Per wife pt was laid off, but worked as an Engineer, 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 Functional Status:  Mobility: Bed Mobility Overal bed mobility: Needs Assistance, +2 for physical assistance Bed Mobility: Supine to Sit, Sit to Supine Supine to sit: Mod assist, +2 for physical assistance Sit to supine: Mod assist, +2 for physical assistance General bed mobility comments: pt needs A for Bil LEs and bringing trunk up to sitting. He does attempt to A with mobility.  Transfers Overall transfer level: Needs assistance Equipment used: 2 person hand held assist Transfers: Sit to/from Stand Sit to Stand: Max assist, +2 physical assistance General transfer comment: pt needs LEs placed on floor and L LE blocked. L knee hyperextends in standing and attempts at weightbearing require L knee to be blocked to prevent buckling.       ADL:    Cognition: Cognition Overall Cognitive Status: Difficult to assess Orientation Level: Oriented X4 Cognition Arousal/Alertness: Awake/alert Behavior During Therapy: WFL for tasks assessed/performed Overall Cognitive Status: Difficult to assess Difficult to assess due to: Non-English speaking (Answers some questions, but very flat.)  Blood pressure 169/66, pulse 84, temperature 98.9 F (37.2 C), temperature source Oral, resp. rate 18, height 5\' 10"  (1.778 m), weight 72.576 kg (160 lb), SpO2 100 %. Physical  Exam  Constitutional: He is oriented to person, place, and time. He appears well-developed.  HENT:  Head: Normocephalic.  Eyes: EOM are normal.  Neck: Normal range of motion. Neck supple. No thyromegaly present.  Cardiovascular: Normal rate and regular rhythm.  Respiratory: Effort normal and breath sounds normal. No respiratory distress.  GI: Soft. Bowel sounds are normal. He exhibits no distension.  Neurological: He is alert and oriented to person, place, and time.  Mood is flat but appropriate. He answers basic questions appropriately. Follows simple commands  Skin: Skin is warm and dry.  Motor strength is 4/5 bilateral deltoid, biceps, triceps, grip, hip flexor, knee extensor, ankle dorsiflexor Sensation difficult to assess secondary to his [place] language skills. He does report ability to sense in all 4 limbs. No swelling in the lower extremities. He does have mild dorsal hand edema in the left upper extremity.   Lab Results Last 24 Hours    Results for orders placed or performed during the hospital encounter of 09/02/15 (from the past 24 hour(s))  Glucose, capillary Status: Abnormal   Collection Time: 09/06/15 10:22 AM  Result Value Ref Range   Glucose-Capillary 166 (H) 65 - 99 mg/dL  Glucose, capillary Status: Abnormal   Collection Time: 09/06/15 12:02 PM  Result Value Ref Range   Glucose-Capillary 165 (H) 65 - 99 mg/dL  Glucose, capillary Status: Abnormal   Collection Time: 09/06/15 3:52 PM  Result Value Ref Range   Glucose-Capillary 189 (H) 65 - 99 mg/dL  Glucose, capillary Status: Abnormal   Collection Time: 09/06/15 11:39 PM  Result  Value Ref Range   Glucose-Capillary 198 (H) 65 - 99 mg/dL  Basic metabolic panel Status: Abnormal   Collection Time: 09/07/15 3:48 AM  Result Value Ref Range   Sodium 151 (H) 135 - 145 mmol/L   Potassium 4.1 3.5 - 5.1 mmol/L   Chloride 115 (H) 101 - 111  mmol/L   CO2 21 (L) 22 - 32 mmol/L   Glucose, Bld 175 (H) 65 - 99 mg/dL   BUN 38 (H) 6 - 20 mg/dL   Creatinine, Ser 3.71 (H) 0.61 - 1.24 mg/dL   Calcium 9.0 8.9 - 10.3 mg/dL   GFR calc non Af Amer 17 (L) >60 mL/min   GFR calc Af Amer 19 (L) >60 mL/min   Anion gap 15 5 - 15  Glucose, capillary Status: Abnormal   Collection Time: 09/07/15 4:00 AM  Result Value Ref Range   Glucose-Capillary 154 (H) 65 - 99 mg/dL  Glucose, capillary Status: Abnormal   Collection Time: 09/07/15 8:16 AM  Result Value Ref Range   Glucose-Capillary 131 (H) 65 - 99 mg/dL      Imaging Results (Last 48 hours)    Mr Brain Wo Contrast  09/07/2015 CLINICAL DATA: Hyperglycemia and acute encephalopathy. LEFT arm weakness today after discontinuing sedation yesterday. History of diabetes and hypertension. EXAM: MRI HEAD WITHOUT CONTRAST TECHNIQUE: Multiplanar, multiecho pulse sequences of the brain and surrounding structures were obtained without intravenous contrast. COMPARISON: MRI/MRA of the head September 02, 2015 FINDINGS: INTRACRANIAL CONTENTS: No reduced diffusion to suggest acute ischemia. Scratch The ventricles and sulci are normal for patient's age. Old RIGHT thalamus infarct. Old LEFT globus pallidus lacunar infarct. LEFT periatrial probable developmental venous anomaly/perivascular space. No suspicious parenchymal signal, mass lesions, mass effect. Again noted is susceptibility artifact within the vermis which could represent cavernoma though is nonspecific. No abnormal extra-axial fluid collections. No extra-axial masses though, contrast enhanced sequences would be more sensitive. Normal major intracranial vascular flow voids present at skull base. ORBITS: The included ocular globes and orbital contents are non-suspicious. Status post RIGHT ocular lens implant. SINUSES: Soft tissue opacifies the sphenoid sinuses with reduced diffusion. Trace mastoid  effusions. SKULL/SOFT TISSUES: No abnormal sellar expansion. No suspicious calvarial bone marrow signal. Craniocervical junction maintained. IMPRESSION: No acute intracranial process, specifically no acute ischemia. 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 New sphenoid sinusitis, possible fungal component. Electronically Signed By: Elon Alas M.D. On: 09/07/2015 02:38     Assessment/Plan: Diagnosis: Deconditioning and encephalopathy following diabetic ketoacidosis with respiratory failure. 1. Does the need for close, 24 hr/day medical supervision in concert with the patient's rehab needs make it unreasonable for this patient to be served in a less intensive setting? Yes 2. Co-Morbidities requiring supervision/potential complications: Diabetes, chronic renal insufficiency, hypertension 3. Due to bladder management, bowel management, safety, skin/wound care, disease management, medication administration, pain management and patient education, does the patient require 24 hr/day rehab nursing? Yes 4. Does the patient require coordinated care of a physician, rehab nurse, PT (1-2 hrs/day, 5 days/week), OT (1-2hrs/day, 5 days/week) and SLP (.5-1 hrs/day, 5 days/week) to address physical and functional deficits in the context of the above medical diagnosis(es)? Yes Addressing deficits in the following areas: balance, endurance, locomotion, strength, transferring, bowel/bladder control, bathing, dressing, feeding, grooming, toileting, cognition and psychosocial support 5. Can the patient actively participate in an intensive therapy program of at least 3 hrs of therapy per day at least 5 days per week? Yes 6. The potential for patient to make measurable gains while  on inpatient rehab is good 7. Anticipated functional outcomes upon discharge from inpatient rehab are supervision with PT, supervision with OT, supervision with SLP. 8. Estimated rehab length of stay to reach the above  functional goals is: 10-14 days 9. Does the patient have adequate social supports and living environment to accommodate these discharge functional goals? Yes 10. Anticipated D/C setting: Home 11. Anticipated post D/C treatments: [place] therapy 12. Overall Rehab/Functional Prognosis: excellent  RECOMMENDATIONS: This patient's condition is appropriate for continued rehabilitative care in the following setting: CIR Patient has agreed to participate in recommended program. Yes Note that insurance prior authorization may be required for reimbursement for recommended care.  Comment: Will need translator    09/07/2015       Revision History     Date/Time User Provider Type Action   09/07/2015 2:59 PM Charlett Blake, MD Physician Sign   09/07/2015 10:05 AM Cathlyn Parsons, PA-C Physician Assistant Pend   View Details Report       Routing History     Date/Time From To Method   09/07/2015 2:59 PM Charlett Blake, MD No Pcp Per Patient In Basket

## 2015-09-08 NOTE — Discharge Summary (Signed)
Physician Discharge Summary  Russell Price [id number] DOB: 1961/03/02 DOA: 09/02/2015  PCP: No PCP Per Patient  Admit date: 09/02/2015 Discharge date: 09/08/2015  Time spent: *25 minutes  Recommendations for Outpatient Follow-up:  1. Follow up PCP in 2 weeks  Discharge Diagnoses:  Active Problems:   DKA (diabetic ketoacidoses) ([place])   Acute on chronic kidney failure ([place])   Acute respiratory failure with hypercapnia ([place])   Diabetic ketoacidosis with coma associated with type 2 diabetes mellitus ([place])   Seizure-like activity ([place])   Acidemia   Discharge Condition: Stable  Diet recommendation: Diabetic diet  Filed Weights   09/06/15 0200 09/06/15 2100 09/08/15 0140  Weight: 72.1 kg (158 lb 15.2 oz) 72.576 kg (160 lb) 77.52 kg (170 lb 14.4 oz)    History of present illness:  55 year old male with history of CK disease stage IV, hyperlipidemia, diabetes mellitus, right eye blindness who was brought to the hospital for altered mental status,hypoglycemia,seizurepatient was found to be in DKA. PCCM was consulted and patient was intubated for airway protection.patient was extubated yesterday. Neurology was consulted for ongoing encephalopathy postoperatively due to DKA. Patient had weakness of left arm yesterday, MRI brain was done which was negative for stroke  Hospital Course:   Acute encephalopathy- resolved,likely metabolic from DKA. MRI brain negative for stroke or other acute lesion. Neurology has signed off.  Seizure- provoked from hyperglycemia, no AED medications needed per neurology.  Diabetic ketoacidosis- resolved, gap closed. IV insulin was discontinued. Patient started on Lantus, sliding scale insulin with NovoLog  Sphenoid sinusitis- MRI of brain showed new sphenoid sinusitis, possible fungal component.will start Diflucan 100 mg by mouth daily for 7 days.  Hypertension- continue Coreg,amlodipine.  Hypernatremia-  Resolved, sodium 141 today  AKI on CKD stage  4- patient's baseline creatinine is around 3.5. Came to hospital with creatinine of 5.29, now slowly improving. Today creatinine 3.46. Follow BMP in a.m.  Elevated troponin- likely from demand ischemia. 1.22 > 1.36> 0.95. No chest pain  Patient to go to inpatient rehab..   Procedures:  EEG Procedures:  Micro: 4/15 BCx>> NGTD 4/15 UCx>> NGTD 4/18 BCx>>pending  Lines/Tubes: ETT 7.67mm 4/15>> OG 4/15 >> Foley 4/15>>  Consultations:  Neurology  [facility]  Discharge Exam: Filed Vitals:   09/08/15 0140 09/08/15 0500  BP: 162/72 140/68  Pulse: 67 60  Temp: 98.8 F (37.1 C) 98.5 F (36.9 C)  Resp: 19 18    General: Appears in no acute distress Cardiovascular: S1S2 RRR Respiratory: Clear bilaterally  Discharge Instructions   Discharge Instructions    Diet - low sodium heart healthy    Complete by:  As directed      Increase activity slowly    Complete by:  As directed           Current Discharge Medication List    START taking these medications   Details  famotidine (PEPCID) 20 MG tablet Take 1 tablet (20 mg total) by mouth daily. Qty: 30 tablet, Refills: 2      CONTINUE these medications which have NOT CHANGED   Details  amLODipine (NORVASC) 10 MG tablet Take 1 tablet (10 mg total) by mouth daily. Stop lisinopril . Qty: 30 tablet, Refills: 11   Associated Diagnoses: Essential hypertension; DM (diabetes mellitus) with complications ([place]); CKD (chronic kidney disease) stage 4, GFR 15-29 ml/min ([place]); Blind right eye    aspirin EC 325 MG tablet Take 1 tablet (325 mg total) by mouth daily. Qty: 30 tablet, Refills: 0  atorvastatin (LIPITOR) 40 MG tablet Take 1 tablet (40 mg total) by mouth daily at 6 PM. Qty: 30 tablet, Refills: 11   Associated Diagnoses: DM (diabetes mellitus) with complications ([place]); Hyperlipidemia    carvedilol (COREG) 6.25 MG tablet Take 1 tablet (6.25 mg total) by mouth 2 (two) times daily with a meal. Qty: 90 tablet, Refills: 11    Associated Diagnoses: Essential hypertension    glipiZIDE (GLUCOTROL) 5 MG tablet Take 2 pills in the morning and one in the evening for diabetes Qty: 90 tablet, Refills: 11   Associated Diagnoses: DM (diabetes mellitus) with complications ([facility])    Insulin Glargine (LANTUS SOLOSTAR) 100 UNIT/ML Solostar Pen Inject 60 units subcutaneously at bedtime. Qty: 15 mL, Refills: 12   Associated Diagnoses: DM (diabetes mellitus) with complications ([place])       No Known Allergies    The results of significant diagnostics from this hospitalization (including imaging, microbiology, ancillary and laboratory) are listed below for reference.    Significant Diagnostic Studies: Ct Head Wo Contrast  09/02/2015  CLINICAL DATA:  Patient found by family on the floor. Recent seizure. EXAM: CT HEAD WITHOUT CONTRAST CT CERVICAL SPINE WITHOUT CONTRAST TECHNIQUE: Multidetector CT imaging of the head and cervical spine was performed following the standard protocol without intravenous contrast. Multiplanar CT image reconstructions of the cervical spine were also generated. COMPARISON:  MRI brain 02/18/2015 FINDINGS: CT HEAD FINDINGS Ventricles and sulci are appropriate for patient's age. No evidence for acute cortically based infarct, intracranial hemorrhage, mass lesion or mass-effect. Orbits are unremarkable. Small amount of fluid within the left maxillary sinus. Remainder of the paranasal sinuses are unremarkable. Mastoid air cells are well aerated. Calvarium is intact. CT CERVICAL SPINE FINDINGS Normal anatomic alignment. No evidence for acute fracture or dislocation. Preservation of the vertebral body and intervertebral disc space heights. Craniocervical junction is intact. Lung apices are unremarkable. ET tube terminates in the distal trachea visualized thyroid is unremarkable. IMPRESSION: No acute intracranial process. No acute cervical spine fracture. Electronically Signed   By: Lovey Newcomer M.D.   On: 09/02/2015 17:24    Ct Cervical Spine Wo Contrast  09/02/2015  CLINICAL DATA:  Patient found by family on the floor. Recent seizure. EXAM: CT HEAD WITHOUT CONTRAST CT CERVICAL SPINE WITHOUT CONTRAST TECHNIQUE: Multidetector CT imaging of the head and cervical spine was performed following the standard protocol without intravenous contrast. Multiplanar CT image reconstructions of the cervical spine were also generated. COMPARISON:  MRI brain 02/18/2015 FINDINGS: CT HEAD FINDINGS Ventricles and sulci are appropriate for patient's age. No evidence for acute cortically based infarct, intracranial hemorrhage, mass lesion or mass-effect. Orbits are unremarkable. Small amount of fluid within the left maxillary sinus. Remainder of the paranasal sinuses are unremarkable. Mastoid air cells are well aerated. Calvarium is intact. CT CERVICAL SPINE FINDINGS Normal anatomic alignment. No evidence for acute fracture or dislocation. Preservation of the vertebral body and intervertebral disc space heights. Craniocervical junction is intact. Lung apices are unremarkable. ET tube terminates in the distal trachea visualized thyroid is unremarkable. IMPRESSION: No acute intracranial process. No acute cervical spine fracture. Electronically Signed   By: Lovey Newcomer M.D.   On: 09/02/2015 17:24   Mr Brain Wo Contrast  09/07/2015  CLINICAL DATA:  Hyperglycemia and acute encephalopathy. LEFT arm weakness today after discontinuing sedation yesterday. History of diabetes and hypertension. EXAM: MRI HEAD WITHOUT CONTRAST TECHNIQUE: Multiplanar, multiecho pulse sequences of the brain and surrounding structures were obtained without intravenous contrast. COMPARISON:  MRI/MRA of the  head September 02, 2015 FINDINGS: INTRACRANIAL CONTENTS: No reduced diffusion to suggest acute ischemia. Scratch The ventricles and sulci are normal for patient's age. Old RIGHT thalamus infarct. Old LEFT globus pallidus lacunar infarct. LEFT periatrial probable developmental venous  anomaly/perivascular space. No suspicious parenchymal signal, mass lesions, mass effect. Again noted is susceptibility artifact within the vermis which could represent cavernoma though is nonspecific. No abnormal extra-axial fluid collections. No extra-axial masses though, contrast enhanced sequences would be more sensitive. Normal major intracranial vascular flow voids present at skull base. ORBITS: The included ocular globes and orbital contents are non-suspicious. Status post RIGHT ocular lens implant. SINUSES: Soft tissue opacifies the sphenoid sinuses with reduced diffusion. Trace mastoid effusions. SKULL/SOFT TISSUES: No abnormal sellar expansion. No suspicious calvarial bone marrow signal. Craniocervical junction maintained. IMPRESSION: No acute intracranial process, specifically no acute ischemia. 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 New sphenoid sinusitis, possible fungal component. Electronically Signed   By: Elon Alas M.D.   On: 09/07/2015 02:38   Mr Brain Wo Contrast  09/02/2015  CLINICAL DATA:  Code stroke. Unresponsive. Evaluate for basilar thrombosis. EXAM: MRA HEAD WITHOUT CONTRAST TECHNIQUE: Angiographic images of the Circle of Willis were obtained using MRA technique without intravenous contrast. COMPARISON:  Head CT from earlier today.  MRI 02/18/2015 FINDINGS: Axial diffusion was performed and is negative for acute infarct. Symmetric carotid arteries at the upper cervical segment. Extensive atherosclerotic irregularity of the bilateral siphons consistent with atheromatous disease. There is a new flow gap at the anterior genu of the right ICA with susceptibility artifact that does not correlate with metal, stent, or gas on prior head CT. There is also poor signal at the left carotid terminus extending into the A1 segment. Beyond these apparent stenoses, there is robust and symmetric flow related signal. Dominant right vertebral artery. Extensive atherosclerotic  irregularity of the left V4 segment, likely occluded distally on a chronic basis .There is atheromatous narrowing of the left proximal PICA. Atheromatous narrowing of the right P1 P2 segment that is mild. No major vessel occlusion in the vertebrobasilar distribution. IMPRESSION: 1. Negative for basilar thrombosis.  No acute infarct. 2. Apparent high-grade stenosis at the left carotid terminus and right cavernous carotid are suspicious for artifact given rapid development since 02/18/2015 study and associated susceptibility artifact. Consider follow-up or CTA depending on clinical circumstances. 3. 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 Electronically Signed   By: Monte Fantasia M.D.   On: 09/02/2015 18:35   Dg Chest Port 1 View  09/05/2015  CLINICAL DATA:  Acute respiratory failure EXAM: PORTABLE CHEST 1 VIEW COMPARISON:  09/04/2015 FINDINGS: Support devices are stable. Lungs are clear. Heart is normal size. No effusions. IMPRESSION: No active disease. Electronically Signed   By: Rolm Baptise M.D.   On: 09/05/2015 08:08   Dg Chest Port 1 View  09/04/2015  CLINICAL DATA:  Acute respiratory failure, intubated patient, diabetic ketoacidosis, acute and chronic renal failure. EXAM: PORTABLE CHEST 1 VIEW COMPARISON:  Portable chest x-ray of September 02, 2015 FINDINGS: The lungs are well-expanded. There is no focal infiltrate. There is no pleural effusion or pneumothorax. The heart and pulmonary vascularity are normal. The mediastinum is normal in width. The endotracheal tube tip lies approximately 6.3 cm above the carina. The esophagogastric tube tip projects below the inferior margin of the image. IMPRESSION: Interval improvement in the appearance of the right lung base with resolution of subsegmental atelectasis or infiltrate.  There is no evidence of pneumonia nor pulmonary edema. No pleural effusions are evident. The support tubes are in stable position.  Electronically Signed   By: David  Martinique M.D.   On: 09/04/2015 07:23   Dg Chest Port 1 View  09/02/2015  CLINICAL DATA:  Patient status post ET tube placement. EXAM: PORTABLE CHEST 1 VIEW COMPARISON:  Chest radiograph 02/20/2015 FINDINGS: ET tube terminates in the distal trachea. Monitoring leads overlie the patient. Stable enlarged cardiac and mediastinal contours. Elevation right hemidiaphragm. Heterogeneous opacities right lung base. No pleural effusion or pneumothorax. IMPRESSION: ET tube terminates in the distal trachea. Elevation right hemidiaphragm. Heterogeneous opacities right lung base may represent atelectasis, aspiration or infection. Electronically Signed   By: Lovey Newcomer M.D.   On: 09/02/2015 17:33   Dg Abd Portable 1v  09/02/2015  CLINICAL DATA:  Enteric tube placed EXAM: PORTABLE ABDOMEN - 1 VIEW COMPARISON:  02/14/2015 abdominal radiograph FINDINGS: Enteric tube terminates in the distal stomach. Mild gaseous distention of the stomach. No dilated small bowel loops in the visualized upper abdomen. No evidence of pneumatosis or pneumoperitoneum. IMPRESSION: Enteric tube terminates in the distal stomach. Electronically Signed   By: Ilona Sorrel M.D.   On: 09/02/2015 22:17    Microbiology: Recent Results (from the past 240 hour(s))  Urine culture     Status: None   Collection Time: 09/02/15  7:18 PM  Result Value Ref Range Status   Specimen Description URINE, CATHETERIZED  Final   Special Requests Normal  Final   Culture NO GROWTH 2 DAYS  Final   Report Status 09/04/2015 FINAL  Final  Blood culture (routine x 2)     Status: None   Collection Time: 09/02/15  7:53 PM  Result Value Ref Range Status   Specimen Description BLOOD RIGHT HAND  Final   Special Requests IN PEDIATRIC BOTTLE [place]  Final   Culture NO GROWTH 5 DAYS  Final   Report Status 09/07/2015 FINAL  Final  Blood culture (routine x 2)     Status: None   Collection Time: 09/02/15  7:55 PM  Result Value Ref Range Status    Specimen Description BLOOD LEFT HAND  Final   Special Requests IN PEDIATRIC BOTTLE 1CC  Final   Culture NO GROWTH 5 DAYS  Final   Report Status 09/07/2015 FINAL  Final  MRSA PCR Screening     Status: None   Collection Time: 09/02/15  9:22 PM  Result Value Ref Range Status   MRSA by PCR NEGATIVE NEGATIVE Final    Comment:        The GeneXpert MRSA Assay (FDA approved for NASAL specimens only), is one component of a comprehensive MRSA colonization surveillance program. It is not intended to diagnose MRSA infection nor to guide or monitor treatment for MRSA infections.   Culture, blood (routine x 2)     Status: None (Preliminary result)   Collection Time: 09/05/15  5:20 AM  Result Value Ref Range Status   Specimen Description BLOOD LEFT HAND  Final   Special Requests IN PEDIATRIC BOTTLE 2CC  Final   Culture NO GROWTH 2 DAYS  Final   Report Status PENDING  Incomplete  Culture, blood (routine x 2)     Status: None (Preliminary result)   Collection Time: 09/05/15  5:28 AM  Result Value Ref Range Status   Specimen Description BLOOD LEFT ARM  Final   Special Requests IN PEDIATRIC BOTTLE 1CC  Final   Culture NO GROWTH 2 DAYS  Final   Report Status PENDING  Incomplete     Labs: Basic Metabolic Panel:  Recent Labs Lab 09/03/15 1242  09/04/15 1013  09/04/15 2152 09/05/15 0004 09/05/15 0523 09/05/15 0838 09/05/15 1158 09/06/15 0230 09/07/15 0348 09/08/15 0545  NA 137  < > 137  --  142  --  143  --   --  147* 151* 141  K 3.3*  < > 3.2*  --  3.5  --  3.2*  --   --  4.1 4.1 3.8  CL 109  < > 109  --  114*  --  111  --   --  114* 115* 106  CO2 16*  < > 16*  --  19*  --  21*  --   --  21* 21* 21*  GLUCOSE 112*  < > 241*  --  125*  --  153*  --   --  128* 175* 202*  BUN 63*  < > 48*  --  43*  --  46*  --   --  44* 38* 35*  CREATININE 4.55*  < > 4.14*  --  3.95*  --  4.14*  --   --  3.99* 3.71* 3.46*  CALCIUM 8.3*  < > 8.5*  --  8.4*  --  8.3*  --   --  8.8* 9.0 8.6*  MG 1.9   --  1.9  --  1.9  --   --  2.0  --  2.1  --   --   PHOS 3.2  < > 3.5  < > 3.2 3.5 2.8 3.3 4.2 4.2  --   --   < > = values in this interval not displayed. Liver Function Tests:  Recent Labs Lab 09/02/15 1630 09/03/15 0036  AST 24 19  ALT 12* 12*  ALKPHOS 125 90  BILITOT 0.5 0.2*  PROT 6.9 6.0*  ALBUMIN 3.5 2.9*   No results for input(s): LIPASE, AMYLASE in the last 168 hours.  Recent Labs Lab 09/02/15 2047  AMMONIA 37*   CBC:  Recent Labs Lab 09/02/15 1844 09/02/15 2053  09/03/15 0721 09/04/15 1013 09/05/15 0523 09/06/15 0230 09/08/15 0545  WBC 23.8* 23.8*  < > 12.0* 9.7 8.2 9.0 5.5  NEUTROABS 22.0* 21.8*  --   --  7.7  --   --   --   HGB 12.4* 13.1  < > 8.8* 9.6* 8.3* 9.2* 9.5*  HCT 33.2* 34.5*  < > 23.9* 27.3* 23.9* 27.2* 29.0*  MCV 76.3* 76.2*  < > 74.2* 75.8* 77.3* 79.8 81.0  PLT 254 195  < > 156 146* 129* 135* 161  < > = values in this interval not displayed. Cardiac Enzymes:  Recent Labs Lab 09/02/15 2053 09/03/15 0036 09/03/15 0721 09/03/15 1242  CKTOTAL  --  138  --   --   CKMB  --  11.5*  --   --   TROPONINI 0.13*  --  1.36*  1.22* 0.95*   BNP: BNP (last 3 results)  Recent Labs  02/14/15 1604 02/19/15 0423  BNP 1109.7* 1476.0*    ProBNP (last 3 results) No results for input(s): PROBNP in the last 8760 hours.  CBG:  Recent Labs Lab 09/07/15 1619 09/07/15 1946 09/08/15 09/08/15 0410 09/08/15 0723  GLUCAP 225* 234* 147* 138* 189*       Signed:  Oswald Hillock MD.  [facility] 09/08/2015, 10:32 AM

## 2015-09-08 NOTE — Interval H&P Note (Signed)
Russell Price was admitted today to Inpatient Rehabilitation with the diagnosis of debility/encephalopathy.  The patient's history has been reviewed, patient examined, and there is no change in status.  Patient continues to be appropriate for intensive inpatient rehabilitation.  I have reviewed the patient's chart and labs.  Questions were answered to the patient's satisfaction. The PAPE has been reviewed and assessment remains appropriate.  SWARTZ,ZACHARY T 09/08/2015, 4:46 PM

## 2015-09-09 ENCOUNTER — Inpatient Hospital Stay (HOSPITAL_COMMUNITY): Payer: Medicaid Other | Admitting: Speech Pathology

## 2015-09-09 ENCOUNTER — Inpatient Hospital Stay (HOSPITAL_COMMUNITY): Payer: Self-pay | Admitting: Physical Therapy

## 2015-09-09 ENCOUNTER — Inpatient Hospital Stay (HOSPITAL_COMMUNITY): Payer: Medicaid Other | Admitting: Occupational Therapy

## 2015-09-09 DIAGNOSIS — I10 Essential (primary) hypertension: Secondary | ICD-10-CM

## 2015-09-09 DIAGNOSIS — R5381 Other malaise: Principal | ICD-10-CM

## 2015-09-09 DIAGNOSIS — N189 Chronic kidney disease, unspecified: Secondary | ICD-10-CM

## 2015-09-09 DIAGNOSIS — N179 Acute kidney failure, unspecified: Secondary | ICD-10-CM

## 2015-09-09 LAB — GLUCOSE, CAPILLARY
GLUCOSE-CAPILLARY: 266 mg/dL — AB (ref 65–99)
GLUCOSE-CAPILLARY: 172 mg/dL — AB (ref 65–99)
Glucose-Capillary: 184 mg/dL — ABNORMAL HIGH (ref 65–99)
Glucose-Capillary: 291 mg/dL — ABNORMAL HIGH (ref 65–99)

## 2015-09-09 NOTE — Progress Notes (Addendum)
Physical Therapy Assessment and Plan  Patient Details  Name: Russell Price MRN: [id number] Date of Birth: November 15, 1960  PT Diagnosis: Abnormal posture, Cognitive deficits, Difficulty walking and Muscle weakness Rehab Potential: Good ELOS: 10-14 days   Today's Date: 09/09/2015 PT Individual Time: 0800-0915 PT Individual Time Calculation (min): 75 min    Problem List:  Patient Active Problem List   Diagnosis Date Noted  . Debility 09/08/2015  . Encephalopathy 09/08/2015  . Acidemia   . Acute on chronic kidney failure ([place])   . Acute respiratory failure with hypercapnia ([place])   . Diabetic ketoacidosis with coma associated with type 2 diabetes mellitus ([place])   . Seizure-like activity ([place])   . DKA (diabetic ketoacidoses) ([place]) 09/02/2015  . NSTEMI (non-ST elevated myocardial infarction) ([place]) 02/22/2015  . Abnormal nuclear cardiac imaging test 02/22/2015  . LV dysfunction   . Acute kidney injury ([place])   . Cerebral thrombosis with cerebral infarction ([place]) 02/18/2015  . Acute respiratory failure with hypoxemia ([place]) 02/14/2015  . ARDS (adult respiratory distress syndrome) ([place]) 02/14/2015  . Hemoptysis 02/14/2015  . Diffuse pulmonary alveolar hemorrhage   . Essential hypertension 11/11/2014  . DM (diabetes mellitus) with complications ([place]) 00/76/2263  . CKD (chronic kidney disease) stage 4, GFR 15-29 ml/min (HCC) 11/11/2014  . Blind right eye 11/11/2014  . Noncompliance 11/11/2014    Past Medical History:  Past Medical History  Diagnosis Date  . Diabetes mellitus without complication ([place])     followed by Dr Chalmers Cater  . Hypertension     followed by [facility]  . Chronic kidney disease    Past Surgical History:  Past Surgical History  Procedure Laterality Date  . Bronchoscopy  02/14/2015    for pulm hemorrhage    Assessment & Plan Clinical Impression: Patient is a 55 y.o. year old male with recent admission to the hospital on 09/02/15 with DKA,  encephalopathy, and seizure s/p fall. Pt intubated afterwards.  Patient transferred to CIR on 09/08/2015 .   Patient currently requires min with mobility secondary to muscle weakness, decreased cardiorespiratoy endurance, impaired timing and sequencing and unbalanced muscle activation and delayed processing.  Prior to hospitalization, patient was independent  with mobility and lived with   in a [place] home.  Home access is  Level entry.  Patient will benefit from skilled PT intervention to maximize safe functional mobility, minimize fall risk and decrease caregiver burden for planned discharge home with 24 hour supervision.  Anticipate patient will benefit from follow up [place] at discharge.  PT - End of Session Endurance Deficit: Yes PT Assessment Rehab Potential (ACUTE/IP ONLY): Good Barriers to Discharge: Other (comment) (langauage barrier refusing intepreter) PT Patient demonstrates impairments in the following area(s): Balance;Endurance;Motor;Perception;Safety PT Transfers Functional Problem(s): Bed to Chair;Bed Mobility;Car;Furniture;Floor PT Locomotion Functional Problem(s): Ambulation;Wheelchair Mobility;Stairs;Other (comment) PT Plan PT Intensity: Minimum of 1-2 x/day ,45 to 90 minutes PT Frequency: 5 out of 7 days PT Duration Estimated Length of Stay: 10-14 days PT Treatment/Interventions: Ambulation/gait training;DME/adaptive equipment instruction;Psychosocial support;Balance/vestibular training;UE/LE Strength taining/ROM;Functional electrical stimulation;Skin care/wound management;UE/LE Coordination activities;Cognitive remediation/compensation;Functional mobility training;Community reintegration;Neuromuscular re-education;Visual/perceptual remediation/compensation;Splinting/orthotics;Stair training;Wheelchair propulsion/positioning;Discharge planning;Therapeutic Activities;Disease management/prevention;Patient/family education;Therapeutic Exercise PT Transfers Anticipated Outcome(s):  Mod I PT Locomotion Anticipated Outcome(s): Mod I PT Recommendation Recommendations for Other Services: Speech consult (unclear if cultural or language (pt and wife refuse intepreter) or cognitive deficits) Follow Up Recommendations: Home health PT Patient destination: Home Equipment Recommended: To be determined Equipment Details: TBD  Skilled Therapeutic Intervention When arrived in session, pt's wife feeding  husband EOB, educated wife on need to have pt do safe functional tasks for himself. Pt performs LAQs, marching, hip add/abd resisted, BUE chops with ball, sit to stands, and heel raises 2x10. Pt and wife educated on rehab plan, safety in mobility, progressing mobility, pt needing to feed self, benefits of OOB, and HEP of seated LE ROM.  PT Evaluation Precautions/Restrictions Precautions Precautions: Fall Restrictions Weight Bearing Restrictions: No General Chart Reviewed: Yes Vital SignsTherapy Vitals Temp: 99.1 F (37.3 C) Temp Source: Oral Pulse Rate: 69 Resp: 18 BP: (!) 126/59 mmHg Patient Position (if appropriate): Sitting Oxygen Therapy SpO2: 100 % O2 Device: Not Delivered Pain Pain Assessment Pain Assessment: No/denies pain Home Living/Prior Functioning Home Living Available Help at Discharge: Family;Available PRN/intermittently Type of Home: Apartment Home Access: Level entry Home Layout: One level Bathroom Shower/Tub: Chiropodist: Standard Prior Function Comments: Per wife pt was laid off, but worked as an Engineer, 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 Cognition Overall Cognitive Status: Impaired/Different from baseline Arousal/Alertness: Awake/alert Orientation Level: Oriented X4 Attention: Sustained Focused Attention: Appears intact Sustained Attention: Impaired Sustained Attention Impairment: Verbal basic;Functional basic Memory: Impaired Memory Impairment: Storage deficit;Decreased recall of new information Awareness:  Impaired Awareness Impairment: Intellectual impairment Problem Solving: Impaired Problem Solving Impairment: Verbal basic;Functional basic Executive Function: Initiating Initiating: Impaired Initiating Impairment: Verbal basic;Functional basic Safety/Judgment: Impaired Comments: follows one to multi unit commands inconsistently with cueing. Difficult to be sure if cognition or language barrier. Pt and wife refuse intepreter despite education. Pt also with difficulty in some automatic tasks requiring cueing like how to get into car and  needing cues to open door first Sensation Light Touch: Appears Intact Motor  Motor Motor: Abnormal postural alignment and control Motor - Skilled Clinical Observations: motor control  Mobility Bed Mobility Bed Mobility: Supine to Sit;Rolling Left;Rolling Right;Sit to Supine Rolling Right: 5: Supervision Rolling Right Details: Verbal cues for precautions/safety Rolling Left: 5: Supervision Rolling Left Details: Verbal cues for precautions/safety Supine to Sit: 5: Supervision Supine to Sit Details: Verbal cues for precautions/safety Sit to Supine: 5: Supervision Sit to Supine - Details: Verbal cues for precautions/safety Transfers Transfers: Yes Sit to Stand: 4: Min assist Sit to Stand Details: Tactile cues for posture Sit to Stand Details (indicate cue type and reason): with cues for weight shift, safety, and sequencing Stand to Sit: 4: Min assist Stand to Sit Details: with cues for weight shift, safety, and sequencing Stand Pivot Transfers: 4: Min assist Stand Pivot Transfer Details (indicate cue type and reason): with cues for weight shift, safety, and sequencing Locomotion  Ambulation Ambulation: Yes  Balance Balance Balance Assessed: Yes Static Sitting Balance Static Sitting - Level of Assistance: 5: Stand by assistance Dynamic Sitting Balance Dynamic Sitting - Level of Assistance: 5: Stand by assistance Static Standing Balance Static  Standing - Level of Assistance: 4: Min assist Dynamic Standing Balance Dynamic Standing - Level of Assistance: 4: Min assist Extremity Assessment      RLE Assessment RLE Assessment: Exceptions to [facility] RLE AROM (degrees) Overall AROM Right Lower Extremity: Within functional limits for tasks assessed RLE Strength RLE Overall Strength: Deficits RLE Overall Strength Comments: grossly 3/5 LLE Assessment LLE Assessment: Exceptions to WFL LLE AROM (degrees) Overall AROM Left Lower Extremity: Within functional limits for tasks assessed LLE PROM (degrees) Overall PROM Left Lower Extremity: Deficits LLE Overall PROM Comments: grossly 3/5   See Function Navigator for Current Functional Status.   Refer to Care Plan for Long Term Goals  Recommendations for  other services: None  Discharge Criteria: Patient will be discharged from PT if patient refuses treatment 3 consecutive times without medical reason, if treatment goals not met, if there is a change in medical status, if patient makes no progress towards goals or if patient is discharged from hospital.  The above assessment, treatment plan, treatment alternatives and goals were discussed and mutually agreed upon: by patient  Monia Pouch 09/09/2015, 9:12 AM

## 2015-09-09 NOTE — Evaluation (Signed)
Speech Language Pathology Assessment and Plan  Patient Details  Name: Russell Price MRN: [id number] Date of Birth: 05/26/1960  SLP Diagnosis: Cognitive Impairments;Dysphagia  Rehab Potential: Excellent ELOS: 10 days     Today's Date: 09/09/2015 SLP Individual Time: 0930-1030 SLP Individual Time Calculation (min): 60 min   Problem List:  Patient Active Problem List   Diagnosis Date Noted  . Debility 09/08/2015  . Encephalopathy 09/08/2015  . Acidemia   . Acute on chronic kidney failure ([place])   . Acute respiratory failure with hypercapnia ([place])   . Diabetic ketoacidosis with coma associated with type 2 diabetes mellitus ([place])   . Seizure-like activity ([place])   . DKA (diabetic ketoacidoses) ([place]) 09/02/2015  . NSTEMI (non-ST elevated myocardial infarction) ([place]) 02/22/2015  . Abnormal nuclear cardiac imaging test 02/22/2015  . LV dysfunction   . Acute kidney injury ([place])   . Cerebral thrombosis with cerebral infarction ([place]) 02/18/2015  . Acute respiratory failure with hypoxemia ([place]) 02/14/2015  . ARDS (adult respiratory distress syndrome) ([place]) 02/14/2015  . Hemoptysis 02/14/2015  . Diffuse pulmonary alveolar hemorrhage   . Essential hypertension 11/11/2014  . DM (diabetes mellitus) with complications ([place]) 62/94/7654  . CKD (chronic kidney disease) stage 4, GFR 15-29 ml/min (HCC) 11/11/2014  . Blind right eye 11/11/2014  . Noncompliance 11/11/2014   Past Medical History:  Past Medical History  Diagnosis Date  . Diabetes mellitus without complication ([place])     followed by Dr Chalmers Cater  . Hypertension     followed by [facility]  . Chronic kidney disease    Past Surgical History:  Past Surgical History  Procedure Laterality Date  . Bronchoscopy  02/14/2015    for pulm hemorrhage    Assessment / Plan / Recommendation Clinical Impression Patient is a 55 y.o. right handed male with history of chronic renal insufficiency, uncontrolled diabetes mellitus,  hypertension, right eye blindness. By chart review patient is married and independent prior to admission. 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 Presented 09/02/2015 with increasing fatigue and weakness over the past few days and later found unresponsive by wife. There was concern for possible seizure. He was intubated for airway protection. MRI of the brain negative for acute intracranial process and incidental noting of sphenoid sinusitis. 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 Echocardiogram with ejection fraction of 60% no wall motion abnormalities without PFO. EEG to generalized background slowing indicating of diffuse cerebral dysfunction. No seizure activity. Patient was extubated 09/05/2015. Subcutaneous heparin for DVT prophylaxis. Physical therapy evaluation completed 09/06/2015 with recommendations of physical medicine rehabilitation consult. Patient was admitted for a comprehensive rehabilitation program on 4/21.   Skilled Therapeutic Interventions          Administered a cognitive-linguistic evaluation and BSE. Please see above for details.   Patient demonstrates moderate cognitive impairments which appear exacerbated by the language barrier characterized by decreased sustained attention, functional problem solving, recall of new information, initiation, intellectual awareness of deficits and safety. Patient also administered a BSE and demonstrates mild oral residue with solid textures that he clears independently with a liquid wash and delayed coughing with large, sequential sips of thin liquids via straw which was eliminated with small, single sips via straw. Recommend patient continue current diet of regular textures with thin liquids with intermittent supervision.  Patient would  benefit from skilled SLP intervention to maximize his cognitive and swallowing function prior to discharge home.    SLP Assessment  Patient will need skilled Speech Lanaguage Pathology Services during CIR admission    Recommendations  SLP Diet Recommendations: Age appropriate regular solids;Thin Liquid Administration via: Cup;Straw Medication Administration: Whole meds with liquid Supervision: Patient able to self feed;Intermittent supervision to cue for compensatory strategies Compensations: Slow rate;Small sips/bites Postural Changes and/or Swallow Maneuvers: Seated upright 90 degrees Oral Care Recommendations: Oral care BID Recommendations for Other Services: Neuropsych consult Patient destination: Home Follow up Recommendations: Outpatient SLP;Home Health SLP;24 hour supervision/assistance Equipment Recommended: None recommended by SLP    SLP Frequency 3 to 5 out of 7 days   SLP Duration  SLP Intensity  SLP Treatment/Interventions 10 days   Minumum of 1-2 x/day, 30 to 90 minutes  Cognitive remediation/compensation;Cueing hierarchy;Dysphagia/aspiration precaution training;Functional tasks;Environmental controls;Internal/external aids;Patient/family education;Therapeutic Activities    Pain No/Denies Pain   Function:  Eating Eating   Modified Consistency Diet: No Eating Assist Level: Supervision or verbal cues           Cognition Comprehension Comprehension assist level: Understands basic 50 - 74% of the time/ requires cueing 25 - 49% of the time  Expression   Expression assist level: Expresses basic 50 - 74% of the time/requires cueing 25 - 49% of the time. Needs to repeat parts of sentences.  Social Interaction Social Interaction assist level: Interacts appropriately with others - No medications needed.  Problem Solving Problem solving assist level: Solves basic 50 - 74% of the time/requires cueing 25 - 49% of the time  Memory Memory assist level: Recognizes or  recalls 50 - 74% of the time/requires cueing 25 - 49% of the time   Short Term Goals: Week 1: SLP Short Term Goal 1 (Week 1): Patient will consume current diet without overt s/s of aspiration with Mod I for use of swallow strategies.  SLP Short Term Goal 2 (Week 1): Patient will demonstrate sustained attention to functional tasks for ~10 minutes with Min A verbal cues for redirection.  SLP Short Term Goal 3 (Week 1): Patient will demonstrate functional problem solving for basic tasks with Min A verbal cues. SLP Short Term Goal 4 (Week 1): Patient will identify 2 physical and 2 cognitive impairments with Min A question and verbal cues.  SLP Short Term Goal 5 (Week 1): Patient will recall 2 events from previous therapy sessions with Min A question and verbal cues.   Refer to Care Plan for Long Term Goals  Recommendations for other services: Neuropsych  Discharge Criteria: Patient will be discharged from SLP if patient refuses treatment 3 consecutive times without medical reason, if treatment goals not met, if there is a change in medical status, if patient makes no progress towards goals or if patient is discharged from hospital.  The above assessment, treatment plan, treatment alternatives and goals were discussed and mutually agreed upon: by patient and by family  Keimon Basaldua 09/09/2015, 4:05 PM

## 2015-09-09 NOTE — Progress Notes (Signed)
Patient ID: Russell Price, male   DOB: September 03, 1960, 55 y.o.   MRN: [id number]   09/09/15.  55 year old patient admitted to the hospital with DKA complicated by acute on chronic renal failure and acute respiratory failure requiring ventilatory support Physical therapy evaluation completed 09/06/2015 with recommendations of physical medicine rehabilitation consult.Patient was admitted for a comprehensive rehabilitation program on 09/08/2015.  Comfortable night.  Wife at bedside.  Past Medical History  Diagnosis Date  . Diabetes mellitus without complication ([place])     followed by Dr Chalmers Cater  . Hypertension     followed by [facility]  . Chronic kidney disease      ROS Constitutional: Positive for malaise/fatigue. Negative for fever and chills.  HENT: Negative for hearing loss.  Eyes:   Blindness to right eye  Respiratory: Negative for cough and shortness of breath.  Cardiovascular: Negative for palpitations and leg swelling.  Gastrointestinal: Positive for constipation. Negative for nausea and vomiting.  Genitourinary: Negative for dysuria and hematuria.  Musculoskeletal: Positive for myalgias.  Neurological: Positive for focal weakness, weakness and headaches.  All other systems reviewed and are negative   Lab Results  Component Value Date   HGBA1C 10.9 07/20/2015   BP Readings from Last 3 Encounters:  09/09/15 126/59  09/08/15 147/62  07/24/15 129/84     CBG (last 3)   Recent Labs  09/08/15 1632 09/08/15 2035 09/09/15 0707  GLUCAP 165* 277* 184*   Physical Exam: Blood pressure 169/71, pulse 85, temperature 98.5 F (36.9 C), temperature source Oral, resp. rate 18, height 5\' 10"  (1.778 m), weight 77.52 kg (170 lb 14.4 oz), SpO2 100 %. Physical Exam Constitutional: He is oriented to person, place, and time. He appears well-developed. Thin HENT:  Head: neg Eyes: EOM are normal.  Neck: Normal range of motion. Neck supple. No  thyromegaly present.  Cardiovascular: Normal rate and regular rhythm.  Respiratory: Effort normal and breath sounds normal. No respiratory distress.  GI: Soft. Bowel sounds are normal. He exhibits no distension.  Neurological: He is alert and oriented to person, place, and time.  Mood is flat but appropriate. He answers basic questions appropriately. Follows simple commands.  Skin: Skin is warm and dry.  Motor- generally weak  Medical Problem List and Plan: 1. Debilitation with acute encephalopathy secondary to DKA 2. DVT Prophylaxis/Anticoagulation: Subcutaneous heparin. Monitor platelet counts of any signs of bleeding 3. Diabetes mellitus of peripheral neuropathy. Lantus insulin 20 units daily. Check blood sugars before meals and at bedtime. Diabetic teaching 4. Question seizure. Provoked from hyperglycemia. No seizure medications indicated per neurology services. EEG negative 5. Hypertension. Norvasc 10 mg daily, Coreg 6.25 mg twice a day. Monitor with increased mobility 6. Chronic renal insufficiency. Baseline creatinine 3. 95. Follow-up chemistries. 7. Sphenoid sinusitis identified on MRI. Diflucan 7 days.

## 2015-09-09 NOTE — Evaluation (Signed)
Occupational Therapy Assessment and Plan  Patient Details  Name: Russell Price MRN: [id number] Date of Birth: January 10, 1961  OT Diagnosis: altered mental status, cognitive deficits and muscle weakness (generalized) Rehab Potential: Rehab Potential (ACUTE ONLY): Good ELOS: 14 days Today's Date: 09/09/2015 OT Individual Time: 1100-1200 OT Individual Time Calculation (min): 60 min    Problem List:  Patient Active Problem List   Diagnosis Date Noted  . Debility 09/08/2015  . Encephalopathy 09/08/2015  . Acidemia   . Acute on chronic kidney failure ([place])   . Acute respiratory failure with hypercapnia ([place])   . Diabetic ketoacidosis with coma associated with type 2 diabetes mellitus ([place])   . Seizure-like activity ([place])   . DKA (diabetic ketoacidoses) ([place]) 09/02/2015  . NSTEMI (non-ST elevated myocardial infarction) ([place]) 02/22/2015  . Abnormal nuclear cardiac imaging test 02/22/2015  . LV dysfunction   . Acute kidney injury ([place])   . Cerebral thrombosis with cerebral infarction ([place]) 02/18/2015  . Acute respiratory failure with hypoxemia ([place]) 02/14/2015  . ARDS (adult respiratory distress syndrome) ([place]) 02/14/2015  . Hemoptysis 02/14/2015  . Diffuse pulmonary alveolar hemorrhage   . Essential hypertension 11/11/2014  . DM (diabetes mellitus) with complications ([place]) 81/27/5170  . CKD (chronic kidney disease) stage 4, GFR 15-29 ml/min ([facility]) 11/11/2014  . Blind right eye 11/11/2014  . Noncompliance 11/11/2014    Past Medical History:  Past Medical History  Diagnosis Date  . Diabetes mellitus without complication ([place])     followed by Dr Chalmers Cater  . Hypertension     followed by [facility]  . Chronic kidney disease    Past Surgical History:  Past Surgical History  Procedure Laterality Date  . Bronchoscopy  02/14/2015    for pulm hemorrhage    Assessment & Plan Clinical Impression: HPI: Russell Price is a 55 y.o. right handed male with history of chronic  renal insufficiency with baseline creatinine 3.95, uncontrolled diabetes mellitus, hypertension, right eye blindness. By chart review patient is married and independent prior to admission. 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 Presented 09/02/2015 with increasing fatigue and weakness over the past few days and later found unresponsive by wife. Noted 2 lacerations on his head. There was concern for possible seizure. He was intubated for airway protection.Elevated troponin 1.22-1.36-0.95 felt to be related to demand ischemia and no cardiology workup advised. Cranial CT scan cervical spine films negative. MRI of the brain negative for acute intracranial process And incidental noting of sphenoid sinusitis. 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 Patient did not receive TPA. Echocardiogram with ejection fraction of 60% no wall motion abnormalities without PFO. EEG to generalized background slowing indicating of diffuse cerebral dysfunction. No seizure activity. Patient was extubated 09/05/2015. Subcutaneous heparin for DVT prophylaxis Patient transferred to CIR on 09/08/2015 .    Patient currently requires mod with basic self-care skills secondary to muscle weakness, decreased cardiorespiratoy endurance, decreased motor planning and decreased initiation, decreased attention, decreased awareness, decreased problem solving, decreased safety awareness, decreased memory and delayed processing.  Prior to hospitalization, patient could complete BADL with independent .  Patient will benefit from skilled intervention to increase independence with basic self-care skills prior to discharge home with care partner.  Anticipate patient will require 24 hour supervision and follow up home health.  OT -  End of  Session Activity Tolerance: Tolerates 10 - 20 min activity with multiple rests Endurance Deficit: Yes Endurance Deficit Description: decreased balance and poor postural control with increasing gait distance OT Assessment Rehab Potential (ACUTE ONLY): Good Barriers to Discharge:  (none) OT Patient demonstrates impairments in the following area(s): Balance;Cognition;Endurance;Perception;Safety OT Basic ADL's Functional Problem(s): Grooming;Bathing;Dressing;Toileting OT Transfers Functional Problem(s): Toilet;Tub/Shower OT Additional Impairment(s): None OT Plan OT Intensity: Minimum of 1-2 x/day, 45 to 90 minutes OT Frequency: 5 out of 7 days OT Duration/Estimated Length of Stay: 14 days OT Treatment/Interventions: Balance/vestibular training;Cognitive remediation/compensation;DME/adaptive equipment instruction;Functional mobility training;Patient/family education;Self Care/advanced ADL retraining;Therapeutic Activities;Therapeutic Exercise;UE/LE Strength taining/ROM OT Recommendation Recommendations for Other Services: Speech consult Patient destination: Home Follow Up Recommendations: Home health OT Equipment Recommended: To be determined   Skilled Therapeutic Intervention OT addressed basic ADLtraining at shower level in wc.  Addressed functional transfers, bed mobility, standing balance, attention, attention, therapeutic activities in functional task.  OT provided min assist with functional mobility to bathroom.  Pt needed increased time to follow one step commands with decreased initiation noted.   Gestural cues were also required for pt to initiate the tasks.  Wife present during assessment and helped pt a lot in areas where he could do if given the time.    OT Evaluation Precautions/Restrictions  Precautions Precautions: Fall Restrictions Weight Bearing Restrictions: No    Vital Signs Therapy Vitals Temp: 98.2 F (36.8 C) Temp Source: Oral Pulse Rate: 61 Resp: 18 BP: (!)  151/66 mmHg Patient Position (if appropriate): Sitting Oxygen Therapy SpO2: 100 % O2 Device: Not Delivered Pain  none   Home Living/Prior Functioning Home Living Available Help at Discharge: Family, Available PRN/intermittently Type of Home: Apartment Home Access: Level entry Home Layout: One level Bathroom Shower/Tub: Chiropodist: Standard Bathroom Accessibility: Yes IADL History Homemaking Responsibilities: No Current License: Yes Mode of Transportation: Car Occupation:  (laid off) Type of Occupation: Counsellor Leisure and Hobbies: tv, soccer Prior Function  Able to Take Stairs?: Yes Driving: Yes Vocation: Workers comp Leisure: Hobbies-yes (Comment) Comments: Per wife pt was laid off, but worked as an Engineer, 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 Vision/Perception  Vision- History Baseline Vision/History: Glaucoma (right eye blind) Patient Visual Report: No change from baseline Vision- Assessment Vision Assessment?: No apparent visual deficits Perception Perception: Impaired Body Scheme:  (decreaswed attention to body parts)  Cognition Overall Cognitive Status: Impaired/Different from baseline Arousal/Alertness: Awake/alert Orientation Level: Person;Place;Situation Person: Oriented Place: Oriented Situation: Oriented Year: 2017 Month: April Day of Week: Correct Memory: Impaired Memory Impairment: Storage deficit;Decreased recall of new information Immediate Memory Recall: Sock;Blue;Bed Memory Recall: Blue;Bed;Sock Memory Recall Sock:  (unable to recall with cues) Memory Recall Blue: Without Cue Memory Recall Bed:  (unable to recall with cues) Attention: Sustained Focused Attention: Appears intact Sustained Attention: Impaired Sustained Attention Impairment: Verbal basic;Functional basic Awareness: Impaired Awareness Impairment: Intellectual impairment Problem Solving: Impaired Problem Solving Impairment: Verbal basic;Functional  basic Executive Function: Initiating Initiating: Impaired Initiating Impairment: Verbal basic;Functional basic Safety/Judgment: Impaired Comments: follows one to multi unit commands inconsistently with cueing. Difficult to be sure if cognition or language barrier. Pt and wife refuse intepreter despite education. Pt also with difficulty in some automatic tasks requiring cueing like how to get into car and  needing cues to open door first Sensation Sensation Light Touch: Appears Intact Coordination Gross Motor Movements are Fluid and Coordinated: Yes Fine Motor Movements are Fluid and Coordinated: Yes Finger Nose Finger Test: good 19.x20 sec BUE Motor  Motor  Motor: Abnormal postural alignment and control Motor - Skilled Clinical Observations: thoracic flexion but able to correct Mobility  Bed Mobility Bed Mobility: Supine to Sit;Rolling Left;Rolling Right;Sit to Supine Rolling Right: 5: Supervision Rolling Right Details: Verbal cues for precautions/safety Rolling Left: 5: Supervision Rolling Left Details: Verbal cues for precautions/safety Supine to Sit: 5: Supervision Supine to Sit Details: Verbal cues for precautions/safety Sit to Supine: 5: Supervision Sit to Supine - Details: Verbal cues for precautions/safety Transfers Sit to Stand: 4: Min assist Sit to Stand Details: Tactile cues for posture Stand to Sit: 4: Min assist Stand to Sit Details (indicate cue type and reason): Tactile cues for placement;Verbal cues for sequencing;Visual cues for safe use of DME/AE  Trunk/Postural Assessment  Cervical Assessment Cervical Assessment: Within Functional Limits Thoracic Assessment Thoracic Assessment: Exceptions to [facility] Thoracic Strength Overall Thoracic Strength: Deficits Postural Control Postural Control: Deficits on evaluation Protective Responses: decreased response time  Balance Balance Balance Assessed: Yes Static Sitting Balance Static Sitting - Level of Assistance: 5:  Stand by assistance Dynamic Sitting Balance Dynamic Sitting - Level of Assistance: 5: Stand by assistance Static Standing Balance Static Standing - Level of Assistance: 4: Min assist Dynamic Standing Balance Dynamic Standing - Level of Assistance: 4: Min assist Extremity/Trunk Assessment RUE Assessment RUE Assessment: Within Functional Limits LUE Assessment LUE Assessment: Within Functional Limits   See Function Navigator for Current Functional Status.   Refer to Care Plan for Long Term Goals  Recommendations for other services: None  Discharge Criteria: Patient will be discharged from OT if patient refuses treatment 3 consecutive times without medical reason, if treatment goals not met, if there is a change in medical status, if patient makes no progress towards goals or if patient is discharged from hospital.  The above assessment, treatment plan, treatment alternatives and goals were discussed and mutually agreed upon: by patient and by family  Lisa Roca 09/09/2015, 5:36 PM

## 2015-09-10 ENCOUNTER — Inpatient Hospital Stay (HOSPITAL_COMMUNITY): Payer: Medicaid Other | Admitting: Occupational Therapy

## 2015-09-10 LAB — CULTURE, BLOOD (ROUTINE X 2)
CULTURE: NO GROWTH
CULTURE: NO GROWTH

## 2015-09-10 LAB — GLUCOSE, CAPILLARY
GLUCOSE-CAPILLARY: 139 mg/dL — AB (ref 65–99)
GLUCOSE-CAPILLARY: 93 mg/dL (ref 65–99)
Glucose-Capillary: 157 mg/dL — ABNORMAL HIGH (ref 65–99)
Glucose-Capillary: 271 mg/dL — ABNORMAL HIGH (ref 65–99)

## 2015-09-10 NOTE — Progress Notes (Signed)
Occupational Therapy Session Note  Patient Details  Name: Russell Price MRN: [id number] Date of Birth: 17-Aug-1960  Today's Date: 09/10/2015 OT Individual Time:  - 1100-1200  (60 min)      Short Term Goals: Week 1:  OT Short Term Goal 1 (Week 1): Pt will be supervision with grooming OT Short Term Goal 2 (Week 1): Pt will be supervision with bathing OT Short Term Goal 3 (Week 1): Pt will be supervision with bathing OT Short Term Goal 4 (Week 1): Pt will be supervision with dressing OT Short Term Goal 5 (Week 1): Pt will be supervision with toilet transfer Week 2:     Skilled Therapeutic Interventions/Progress Updates:    1st session:  OT addressed basic ressed functional transfers, bed mobility, standing balance, attention, attention, therapeutic activities in functional task. OT provided SBA ssist with functional mobility to bathroom with wife observing and stating, "He can walk by himself. "  Explained importance of being with pt when he is going to bathroom.   Ambulated to [facility] gym.  Did explanation and demonstration visually for set up for checkers.  Pt unable to do until 3rd attempt with wife talking in [place].  Pt needed verbal and physical assist with this activity.  Pt stood on compliant surface with no LOB.  He  needed increased time to follow one step commands with decreased initiation noted. Gestural cues were also required for pt to initiate the tasks. Wife present during treatment and giving pt instructions in [place].  Did cross pipe tree with max assist for getting correct item and assembling.  Pt /and wife returned to room and left with all needs in reach.      Therapy Documentation Precautions:  Precautions Precautions: Fall Restrictions Weight Bearing Restrictions: No    Vital Signs: Therapy Vitals Temp: 98.6 F (37 C) Temp Source: Oral Pulse Rate: 65 Resp: 20 BP: (!) 139/52 mmHg Patient Position (if appropriate): Lying Oxygen Therapy SpO2: 100 % O2  Device: Not Delivered Pain:  None noted           See Function Navigator for Current Functional Status.   Therapy/Group: Individual Therapy  Lisa Roca 09/10/2015, 8:04 AM

## 2015-09-10 NOTE — Progress Notes (Signed)
Patient ID: Russell Price, male   DOB: 08/21/1960, 55 y.o.   MRN: [id number]   Patient ID: Russell Price, male   DOB: [birth year], 55 y.o.   MRN: [id number]   09/10/15.  55 year old patient admitted to the hospital with DKA complicated by acute on chronic renal failure and acute respiratory failure requiring ventilatory support Physical therapy evaluation completed 09/06/2015 with recommendations of physical medicine rehabilitation consult.Patient was admitted for a comprehensive rehabilitation program on 09/08/2015.  Comfortable night.  Wife at bedside.  Patient has been up and ambulatory in the room. Wife has been very concerned about discharge planning. (No health insurance, cost of medications, etc.)  Past Medical History  Diagnosis Date  . Diabetes mellitus without complication ([place])     followed by Dr Chalmers Cater  . Hypertension     followed by [facility]  . Chronic kidney disease      ROS Constitutional: Positive for malaise/fatigue. Negative for fever and chills.  HENT: Negative for hearing loss.  Eyes:   Blindness to right eye  Respiratory: Negative for cough and shortness of breath.  Cardiovascular: Negative for palpitations and leg swelling.  Gastrointestinal: Positive for constipation. Negative for nausea and vomiting.  Genitourinary: Negative for dysuria and hematuria.  Musculoskeletal: Positive for myalgias.  Neurological: Positive for focal weakness, weakness and headaches.  All other systems reviewed and are negative   Lab Results  Component Value Date   HGBA1C 10.9 07/20/2015   BP Readings from Last 3 Encounters:  09/10/15 139/52  09/08/15 147/62  07/24/15 129/84     CBG (last 3)   Recent Labs  09/09/15 1638 09/09/15 2053 09/10/15 0651  GLUCAP 266* 172* 157*   Physical Exam: Blood pressure 169/71, pulse 85, temperature 98.5 F (36.9 C), temperature source Oral, resp. rate 18, height 5\' 10"  (1.778 m), weight 77.52 kg  (170 lb 14.4 oz), SpO2 100 %. Physical Exam Constitutional: He is oriented to person, place, and time. He appears well-developed. Thin HENT:  Head: neg Eyes: EOM are normal.  Neck: Normal range of motion. Neck supple. No thyromegaly present.  Cardiovascular: Normal rate and regular rhythm.  Respiratory: Effort normal and breath sounds normal. No respiratory distress.  GI: Soft. Bowel sounds are normal. He exhibits no distension.  Neurological: He is alert and oriented to person, place, and time.  Mood is flat but appropriate. He answers basic questions appropriately. Follows simple commands.  Skin: Skin is warm and dry.  Motor- generally weak  Medical Problem List and Plan: 1. Debilitation with acute encephalopathy secondary to DKA 2. DVT Prophylaxis/Anticoagulation: Subcutaneous heparin. Monitor platelet counts of any signs of bleeding 3. Diabetes mellitus of peripheral neuropathy. Lantus insulin 20 units daily. Check blood sugars before meals and at bedtime. Diabetic teaching 4. Question seizure. Provoked from hyperglycemia. No seizure medications indicated per neurology services. EEG negative 5. Hypertension. Norvasc 10 mg daily, Coreg 6.25 mg twice a day. Monitor with increased mobility 6. Chronic renal insufficiency. Baseline creatinine 3. 95. Follow-up chemistries. 7. Sphenoid sinusitis identified on MRI. Diflucan 7 days.

## 2015-09-11 ENCOUNTER — Inpatient Hospital Stay (HOSPITAL_COMMUNITY): Payer: Self-pay | Admitting: Occupational Therapy

## 2015-09-11 ENCOUNTER — Inpatient Hospital Stay (HOSPITAL_COMMUNITY): Payer: Medicaid Other | Admitting: Occupational Therapy

## 2015-09-11 ENCOUNTER — Inpatient Hospital Stay (HOSPITAL_COMMUNITY): Payer: Medicaid Other | Admitting: Physical Therapy

## 2015-09-11 ENCOUNTER — Inpatient Hospital Stay (HOSPITAL_COMMUNITY): Payer: Medicaid Other | Admitting: Speech Pathology

## 2015-09-11 ENCOUNTER — Inpatient Hospital Stay (HOSPITAL_COMMUNITY): Payer: Self-pay | Admitting: Physical Therapy

## 2015-09-11 DIAGNOSIS — D649 Anemia, unspecified: Secondary | ICD-10-CM

## 2015-09-11 DIAGNOSIS — D638 Anemia in other chronic diseases classified elsewhere: Secondary | ICD-10-CM | POA: Insufficient documentation

## 2015-09-11 DIAGNOSIS — J0130 Acute sphenoidal sinusitis, unspecified: Secondary | ICD-10-CM

## 2015-09-11 DIAGNOSIS — N184 Chronic kidney disease, stage 4 (severe): Secondary | ICD-10-CM

## 2015-09-11 LAB — CBC WITH DIFFERENTIAL/PLATELET
BASOS ABS: 0 10*3/uL (ref 0.0–0.1)
Basophils Relative: 0 %
EOS ABS: 0.1 10*3/uL (ref 0.0–0.7)
Eosinophils Relative: 1 %
HCT: 25.6 % — ABNORMAL LOW (ref 39.0–52.0)
HEMOGLOBIN: 8.6 g/dL — AB (ref 13.0–17.0)
Lymphocytes Relative: 24 %
Lymphs Abs: 1.3 10*3/uL (ref 0.7–4.0)
MCH: 27.1 pg (ref 26.0–34.0)
MCHC: 33.6 g/dL (ref 30.0–36.0)
MCV: 80.8 fL (ref 78.0–100.0)
Monocytes Absolute: 0.5 10*3/uL (ref 0.1–1.0)
Monocytes Relative: 9 %
NEUTROS PCT: 65 %
Neutro Abs: 3.6 10*3/uL (ref 1.7–7.7)
Platelets: 178 10*3/uL (ref 150–400)
RBC: 3.17 MIL/uL — AB (ref 4.22–5.81)
RDW: 12.2 % (ref 11.5–15.5)
WBC: 5.7 10*3/uL (ref 4.0–10.5)

## 2015-09-11 LAB — COMPREHENSIVE METABOLIC PANEL
ALBUMIN: 2.6 g/dL — AB (ref 3.5–5.0)
ALK PHOS: 78 U/L (ref 38–126)
ALT: 58 U/L (ref 17–63)
ANION GAP: 9 (ref 5–15)
AST: 55 U/L — ABNORMAL HIGH (ref 15–41)
BUN: 41 mg/dL — ABNORMAL HIGH (ref 6–20)
CALCIUM: 8.7 mg/dL — AB (ref 8.9–10.3)
CO2: 24 mmol/L (ref 22–32)
Chloride: 107 mmol/L (ref 101–111)
Creatinine, Ser: 3.67 mg/dL — ABNORMAL HIGH (ref 0.61–1.24)
GFR calc Af Amer: 20 mL/min — ABNORMAL LOW (ref >60)
GFR calc non Af Amer: 17 mL/min — ABNORMAL LOW (ref >60)
Glucose, Bld: 171 mg/dL — ABNORMAL HIGH (ref 65–99)
Potassium: 4.4 mmol/L (ref 3.5–5.1)
Sodium: 140 mmol/L (ref 135–145)
Total Bilirubin: 0.4 mg/dL (ref 0.3–1.2)
Total Protein: 6 g/dL — ABNORMAL LOW (ref 6.5–8.1)

## 2015-09-11 LAB — GLUCOSE, CAPILLARY
Glucose-Capillary: 99 mg/dL (ref 65–99)
Glucose-Capillary: 185 mg/dL — ABNORMAL HIGH (ref 65–99)
Glucose-Capillary: 200 mg/dL — ABNORMAL HIGH (ref 65–99)
Glucose-Capillary: 77 mg/dL (ref 65–99)

## 2015-09-11 MED ORDER — CHLORTHALIDONE 25 MG PO TABS
25.0000 mg | ORAL_TABLET | Freq: Every day | ORAL | Status: DC
  Administered 2015-09-11 – 2015-09-16 (×6): 25 mg via ORAL
  Filled 2015-09-11 (×7): qty 1

## 2015-09-11 NOTE — Progress Notes (Signed)
Physical Therapy Session Note  Patient Details  Name: Russell Price MRN: [id number] Date of Birth: 1961/01/01  Today's Date: 09/11/2015 PT Individual Time: 0900-0932 PT Individual Time Calculation (min): 32 min   Short Term Goals: Week 1:  PT Short Term Goal 1 (Week 1): Pt will perform bed mobility with SBA and LRAD PT Short Term Goal 2 (Week 1): Pt will perform transfers with SBA and LRAD PT Short Term Goal 3 (Week 1): Pt will perform gait with SBA and LRAD 150' PT Short Term Goal 4 (Week 1): Pt will perform 8 stairs SBA and LRAD    Therapy Documentation Precautions:  Precautions Precautions: Fall Restrictions Weight Bearing Restrictions: No Vital Signs: Therapy Vitals Pulse Rate: 64 BP: (!) 151/77 mmHg Oxygen Therapy SpO2: 99 % O2 Device: Not Delivered Pain: Pain Assessment Pain Assessment: 0-10 Pain Score: 0-No pain    Balance: Balance Balance Assessed: Yes Standardized Balance Assessment Standardized Balance Assessment: Dynamic Gait Index Berg Balance Test Sit to Stand: Able to stand without using hands and stabilize independently Standing Unsupported: Able to stand safely 2 minutes Sitting with Back Unsupported but Feet Supported on Floor or Stool: Able to sit safely and securely 2 minutes Stand to Sit: Sits safely with minimal use of hands Transfers: Able to transfer safely, definite need of hands Standing Unsupported with Eyes Closed: Able to stand 10 seconds safely Standing Ubsupported with Feet Together: Able to place feet together independently and stand 1 minute safely From Standing, Reach Forward with Outstretched Arm: Can reach forward >12 cm safely (5") From Standing Position, Pick up Object from Floor: Able to pick up shoe, needs supervision From Standing Position, Turn to Look Behind Over each Shoulder: Turn sideways only but maintains balance Turn 360 Degrees: Able to turn 360 degrees safely but slowly Standing Unsupported, Alternately Place Feet  on Step/Stool: Able to complete 4 steps without aid or supervision Standing Unsupported, One Foot in Front: Loses balance while stepping or standing Standing on One Leg: Able to lift leg independently and hold equal to or more than 3 seconds Total Score: 41 Dynamic Gait Index Level Surface: Normal Change in Gait Speed: Mild Impairment Gait with Horizontal Head Turns: Mild Impairment Gait with Vertical Head Turns: Mild Impairment Gait and Pivot Turn: Mild Impairment Step Over Obstacle: Mild Impairment Step Around Obstacles: Normal Steps: Mild Impairment Total Score: 18   Short sit to and from supine in bed mod I with use of bed rail   Sit to and from stand transfer supervision  Patient ambulated 175 feet without assistive device close supervision with incidental min assist secondary to LOB to the left. Patient demonstrates increased postural sway, and uneven step length and cadence. Patient requires verbal cues throughout for environmental awareness and obstacle negotiation.   Patient up and 12 steps with right handrail close supervision incidental min assist. Patient educated on proper sequence and technique and able to progress toward close supervision at end of session with cues for hand placement and pacing.   Nu-Step 10 minutes level 7.    Patient tolerated tx well and spouse observed session. Discussed challenges during higher level balance and activities as noted on berg balance test that places patient at risk for falls.  Patient performed well on Dynamic gait index with mild impairments noted when performing dual tasks with ambulation and changes in velocity or obstacles. Discussion with patient and spouse need for assistance with all activity patient and spouse verbalized understanding. Patient returned to bed at end of  session with all needs met and bed alarm engaged.   See Function Navigator for Current Functional Status.   Therapy/Group: Individual  Therapy  Retta Diones 09/11/2015, 12:32 PM

## 2015-09-11 NOTE — Progress Notes (Signed)
Occupational Therapy Session Note  Patient Details  Name: Russell Price MRN: [id number] Date of Birth: [birth year]  Today's Date: 09/11/2015 OT Individual Time: 0702-0758 OT Individual Time Calculation (min): 56 min    Short Term Goals: Week 1:  OT Short Term Goal 1 (Week 1): Pt will be supervision with grooming OT Short Term Goal 2 (Week 1): Pt will be supervision with bathing OT Short Term Goal 3 (Week 1): Pt will be supervision with bathing OT Short Term Goal 4 (Week 1): Pt will be supervision with dressing OT Short Term Goal 5 (Week 1): Pt will be supervision with toilet transfer  Skilled Therapeutic Interventions/Progress Updates:    Upon entering the room, pt supine and sleeping in bed with wife present in the room. Pt with no c/o pain this session. OT providing education throughout session of OT purpose, POC, and goals as she continues to provide assistance pt instead of giving him time to complete task on his own. Pt ambulated in room without use of AD with steady assist. Pt engaged in shower with sit <> stand on TTB with steady assistance for balance during bathing. Pt returning to sit on EOB for dressing tasks with steady assist as well.Pt with fatigue and required rest break. Pt seated on EOB with breakfast and set up A from wife with pt feeding himself with R hand. Call bell and all needed items within reach upon exiting the room.   Therapy Documentation Precautions:  Precautions Precautions: Fall Restrictions Weight Bearing Restrictions: No General:   Vital Signs: Therapy Vitals Temp: 98.7 F (37.1 C) Temp Source: Oral Pulse Rate: 63 Resp: 18 BP: (!) 157/71 mmHg Patient Position (if appropriate): Lying Oxygen Therapy SpO2: 100 % O2 Device: Not Delivered  See Function Navigator for Current Functional Status.   Therapy/Group: Individual Therapy  Phineas Semen 09/11/2015, 8:02 AM

## 2015-09-11 NOTE — Progress Notes (Signed)
Occupational Therapy Session Note  Patient Details  Name: Russell Price MRN: [id number] Date of Birth: 14-Apr-1961  Today's Date: 09/11/2015 OT Individual Time: 1300-1330 OT Individual Time Calculation (min): 30 min    Short Term Goals: Week 1:  OT Short Term Goal 1 (Week 1): Pt will be supervision with grooming OT Short Term Goal 2 (Week 1): Pt will be supervision with bathing OT Short Term Goal 3 (Week 1): Pt will be supervision with bathing OT Short Term Goal 4 (Week 1): Pt will be supervision with dressing OT Short Term Goal 5 (Week 1): Pt will be supervision with toilet transfer  Skilled Therapeutic Interventions/Progress Updates:    Pt seen for OT session focusing on functional transfers and ambulation. Pt in supine upon arrival, agreeable to tx session. He ambulated throughout unit with CGA. In ADL apartment, pt completed simulated shower stall transfer with HHA and VCs for technique. He then completed simple kitchen mobility task, removing and replacing items from overhead cabinet, using B UEs, requiring steadying assist for higher level balancing task. He ambulated throughout unit back to room.  Pt requiring min-mod A to regain balance after stumbling into a chair. Pt required max cuing to recall location of room with cues for reading directional signs in hallway.  Pt left in supine at end of session, bed alarm on, all needs in reach and wife present.    Therapy Documentation Precautions:  Precautions Precautions: Fall Restrictions Weight Bearing Restrictions: No Pain: Pain Assessment Pain Assessment: No/denies pain  See Function Navigator for Current Functional Status.   Therapy/Group: Individual Therapy  Lewis, Kairee Kozma C 09/11/2015, 3:06 PM

## 2015-09-11 NOTE — Progress Notes (Signed)
Speech Language Pathology Daily Session Note  Patient Details  Name: Russell Price MRN: [id number] Date of Birth: [birth year]  Today's Date: 09/11/2015 SLP Individual Time: 1500-1530 SLP Individual Time Calculation (min): 30 min  Short Term Goals: Week 1: SLP Short Term Goal 1 (Week 1): Patient will consume current diet without overt s/s of aspiration with Mod I for use of swallow strategies.  SLP Short Term Goal 2 (Week 1): Patient will demonstrate sustained attention to functional tasks for ~10 minutes with Min A verbal cues for redirection.  SLP Short Term Goal 3 (Week 1): Patient will demonstrate functional problem solving for basic tasks with Min A verbal cues. SLP Short Term Goal 4 (Week 1): Patient will identify 2 physical and 2 cognitive impairments with Min A question and verbal cues.  SLP Short Term Goal 5 (Week 1): Patient will recall 2 events from previous therapy sessions with Min A question and verbal cues.   Skilled Therapeutic Interventions: Skilled treatment session focused on addressing cognition goals.  SLP facilitated session by providing functional written task with Mod verbal and visual cues to initiate and problem solve tasks such as clock drawing, puzzle completion and writing biographical information.  Wife present for session and asked appropriate questions about if these type of deficits are typical and how to help her husband recover.  SLP demonstrated retrieval strategies of asking questions, giving choices or starting something for him and allowing him to take over.  Continue with current plan of care.    Function:   Cognition Comprehension Comprehension assist level: Understands basic 50 - 74% of the time/ requires cueing 25 - 49% of the time  Expression   Expression assist level: Expresses basic 50 - 74% of the time/requires cueing 25 - 49% of the time. Needs to repeat parts of sentences.  Social Interaction Social Interaction assist level: Interacts  appropriately 75 - 89% of the time - Needs redirection for appropriate language or to initiate interaction.  Problem Solving Problem solving assist level: Solves basic 25 - 49% of the time - needs direction more than half the time to initiate, plan or complete simple activities  Memory Memory assist level: Recognizes or recalls 25 - 49% of the time/requires cueing 50 - 75% of the time    Pain Pain Assessment Pain Assessment: No/denies pain  Therapy/Group: Individual Therapy  Carmelia Roller., [place]  [place] 09/11/2015, 4:33 PM

## 2015-09-11 NOTE — Care Management Note (Signed)
Inpatient Rehabilitation Center Individual Statement of Services  Patient Name:  BUDD LUCKE  Date:  09/11/2015  Welcome to the [facility].  Our goal is to provide you with an individualized program based on your diagnosis and situation, designed to meet your specific needs.  With this comprehensive rehabilitation program, you will be expected to participate in at least 3 hours of rehabilitation therapies Monday-Friday, with modified therapy programming on the weekends.  Your rehabilitation program will include the following services:  Physical Therapy (PT), Occupational Therapy (OT), 24 hour per day rehabilitation nursing, Therapeutic Recreaction (TR), Case Management (Social Worker), Rehabilitation Medicine, Nutrition Services and Pharmacy Services  Weekly team conferences will be held on Wednesdays to discuss your progress.  Your Social Worker will talk with you frequently to get your input and to update you on team discussions.  Team conferences with you and your family in attendance may also be held.  Expected length of stay: 10-14 days  Overall anticipated outcome: independent  Depending on your progress and recovery, your program may change. Your Social Worker will coordinate services and will keep you informed of any changes. Your Social Worker's name and contact numbers are listed  below.  The following services may also be recommended but are not provided by the [facility] will be made to provide these services after discharge if needed.  Arrangements include referral to agencies that provide these services.  Your insurance has been verified to be:  None (Education officer, museum will investigate if SSDisability application is needed) Your primary doctor is:  None  (Education officer, museum to assist with referral to  [facility])  Pertinent information will be shared with your doctor and your insurance company.  Social Worker:  [place], [place] or ([phone]   Information discussed with and copy given to patient by: Lennart Pall, 09/11/2015, 4:25 PM

## 2015-09-11 NOTE — IPOC Note (Signed)
Overall Plan of Care [facility]) Patient Details Name: Russell Price MRN: [id number] DOB: 03/15/1961  Admitting Diagnosis: DECONDITIONING WITH ENCEPHALOPATHY  Hospital Problems: Principal Problem:   Debility Active Problems:   Essential hypertension   DKA (diabetic ketoacidoses) ([place])   Acute on chronic kidney failure (HCC)   Encephalopathy   Chronic kidney disease (CKD), stage IV (severe) (HCC)   Absolute anemia   Acute sphenoidal sinusitis     Functional Problem List: Nursing Behavior, Endurance, Medication Management, Pain, Safety, Skin Integrity, Bladder, Bowel  PT Balance, Endurance, Motor, Perception, Safety  OT Balance, Cognition, Endurance, Perception, Safety  SLP Cognition  TR         Basic ADL's: OT Grooming, Bathing, Dressing, Toileting     Advanced  ADL's: OT       Transfers: PT Bed to Chair, Bed Mobility, Car, Furniture, Floor  OT Toilet, Metallurgist: PT Ambulation, Emergency planning/management officer, Stairs, Other (comment)     Additional Impairments: OT None  SLP Swallowing, Social Cognition   Social Interaction, Problem Solving, Memory, Attention, Awareness  TR      Anticipated Outcomes Item Anticipated Outcome  Self Feeding    Swallowing  Mod I   Basic self-care  mod I  Toileting  mod I   Bathroom Transfers modI  Bowel/Bladder  Patient will remain continent of bowel and bladder with min assist  Transfers  Mod I  Locomotion  Mod I  Communication     Cognition  Min A   Pain  pain will be equal to or less than 4/10 with min assist  Safety/Judgment  patient will remain free from falls/injury and display sound safety judgement with mod assist   Therapy Plan: PT Intensity: Minimum of 1-2 x/day ,45 to 90 minutes PT Frequency: 5 out of 7 days PT Duration Estimated Length of Stay: 10-14 days OT Intensity: Minimum of 1-2 x/day, 45 to 90 minutes OT Frequency: 5 out of 7 days OT Duration/Estimated Length of Stay: 14 days SLP Intensity:  Minumum of 1-2 x/day, 30 to 90 minutes SLP Frequency: 3 to 5 out of 7 days SLP Duration/Estimated Length of Stay: 10 days        Team Interventions: Nursing Interventions Patient/Family Education, Pain Management, Medication Management, Discharge Planning, Bladder Management, Bowel Management, Skin Care/Wound Management  PT interventions Ambulation/gait training, DME/adaptive equipment instruction, Psychosocial support, Balance/vestibular training, UE/LE Strength taining/ROM, Functional electrical stimulation, Skin care/wound management, UE/LE Coordination activities, Cognitive remediation/compensation, Functional mobility training, Community reintegration, Neuromuscular re-education, Visual/perceptual remediation/compensation, Splinting/orthotics, IT trainer, Curator, Discharge planning, Therapeutic Activities, Disease management/prevention, Barrister's clerk education, Therapeutic Exercise  OT Interventions Training and development officer, Cognitive remediation/compensation, DME/adaptive equipment instruction, Functional mobility training, Patient/family education, Self Care/advanced ADL retraining, Therapeutic Activities, Therapeutic Exercise, UE/LE Strength taining/ROM  SLP Interventions Cognitive remediation/compensation, Cueing hierarchy, Dysphagia/aspiration precaution training, Functional tasks, Environmental controls, Internal/external aids, Patient/family education, Therapeutic Activities  TR Interventions    SW/CM Interventions Discharge Planning, Psychosocial Support, Patient/Family Education    Team Discharge Planning: Destination: PT-Home ,OT- Home , SLP-Home Projected Follow-up: PT-Home health PT, OT-  Home health OT, SLP-Outpatient SLP, Home Health SLP, 24 hour supervision/assistance Projected Equipment Needs: PT-To be determined, OT- To be determined, SLP-None recommended by SLP Equipment Details: PT-TBD, OT-  Patient/family involved in discharge planning:  PT- Patient, Family member/caregiver,  OT-Family member/caregiver, Patient, SLP-Patient, Family member/caregiver  MD ELOS: 7-10 days. Medical Rehab Prognosis:  Excellent Assessment: 55 y.o. right handed  male with history of chronic renal insufficiency with baseline creatinine 3.95,  uncontrolled diabetes mellitus, hypertension, right eye blindness. Presented 09/02/2015 with increasing fatigue and weakness over the past few days and later found unresponsive by wife. Noted 2 lacerations on his head. There was concern for possible seizure. He was intubated for airway protection.Elevated troponin 1.22-1.36-0.95 felt to be related to demand ischemia and no cardiology workup advised. Cranial CT scan cervical spine films negative. MRI of the brain negative for acute intracranial process And incidental noting of sphenoid sinusitis. 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 Patient did not receive TPA. Echocardiogram with ejection fraction of 60% no wall motion abnormalities without PFO. EEG to generalized background slowing indicating of diffuse cerebral dysfunction. No seizure activity. Patient was extubated 09/05/2015.   See Team Conference Notes for weekly updates to the plan of care

## 2015-09-11 NOTE — Progress Notes (Signed)
Social Work  Social Work Assessment and Plan  Patient Details  Name: Russell Price MRN: [id number] Date of Birth: 12-23-1960  Today's Date: 09/11/2015  Problem List:  Patient Active Problem List   Diagnosis Date Noted  . Chronic kidney disease (CKD), stage IV (severe) ([place])   . Absolute anemia   . Acute sphenoidal sinusitis   . Debility 09/08/2015  . Encephalopathy 09/08/2015  . Acidemia   . Acute on chronic kidney failure ([place])   . Acute respiratory failure with hypercapnia ([place])   . Diabetic ketoacidosis with coma associated with type 2 diabetes mellitus ([place])   . Seizure-like activity ([place])   . DKA (diabetic ketoacidoses) ([place]) 09/02/2015  . NSTEMI (non-ST elevated myocardial infarction) ([place]) 02/22/2015  . Abnormal nuclear cardiac imaging test 02/22/2015  . LV dysfunction   . Acute kidney injury ([place])   . Cerebral thrombosis with cerebral infarction ([place]) 02/18/2015  . Acute respiratory failure with hypoxemia ([place]) 02/14/2015  . ARDS (adult respiratory distress syndrome) ([place]) 02/14/2015  . Hemoptysis 02/14/2015  . Diffuse pulmonary alveolar hemorrhage   . Essential hypertension 11/11/2014  . DM (diabetes mellitus) with complications ([place]) [id number]  . CKD (chronic kidney disease) stage 4, GFR 15-29 ml/min (HCC) 11/11/2014  . Blind right eye 11/11/2014  . Noncompliance 11/11/2014   Past Medical History:  Past Medical History  Diagnosis Date  . Diabetes mellitus without complication ([place])     followed by Dr Chalmers Cater  . Hypertension     followed by [facility]  . Chronic kidney disease    Past Surgical History:  Past Surgical History  Procedure Laterality Date  . Bronchoscopy  02/14/2015    for pulm hemorrhage   Social History:  reports that he quit smoking about 32 years ago. He does not have any smokeless tobacco history on file. He reports that he does not drink alcohol or use illicit drugs.  Family / Support Systems Marital Status:  Married How Long?: 13 yrs Patient Roles: Spouse Spouse/Significant Other: wife, Bing Neighbors @ [phone] Children: None Other Supports: pt has one sister living locally Anticipated Caregiver: Spouse: Raga Zaki Ability/Limitations of Caregiver: None Caregiver Availability: 24/7 Family Dynamics: Wife very attentive and reports that she will providing any assistance he needs and will "not go back to work until I know he is ok."    Social History Preferred language: English Religion:  Cultural Background: Pt and wife originally from [place] with pt having moved to the [place] in 1996 and his wife in 2004.  They are Muslim. 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 Legal Hisotry/Current Legal Issues: None Guardian/Conservator: None - per MD, pt is capable of making decisions on his own behalf   Abuse/Neglect Physical Abuse: Denies Verbal Abuse: Denies Sexual Abuse: Denies Exploitation of patient/patient's resources: Denies Self-Neglect: Denies  Emotional Status Pt's affect, behavior adn adjustment status: Pt lying in bed and offers brief answers but smiling throughout and very polite.  Wife does most of the talking and answers questions for pt at times.  He denies any mood disturbance and no s/s of depression/ anxiety.  Wife notes that he has many visitors and friends from the local soccer community who visit "and this helps him".  Will monitor while here and refer to neurpsychology as indicated. Recent Psychosocial Issues: Chronic health issues including DM and renal insufficiency which have caused  him to be unable to work and much financial strain on the household.  Wife works only a couple of days a week. Pyschiatric History: None Substance Abuse History: None  Patient / Family Perceptions, Expectations & Goals Pt/Family  understanding of illness & functional limitations: Pt and wife with good understanding of pt's ongoing issue with DM and renal problems.  Wife appears to have a good, general understanding of his medical issues and very focused on getting assistance with managing his healthcare. Premorbid pt/family roles/activities: Pt was independent and active prior to Dec when health began to deteriorate again.  Wife working p/t and providing primary caregiver support. Anticipated changes in roles/activities/participation: Goals have been set for mod ind - independent so do not anticipate much role change as wife has been primary support.   Pt/family expectations/goals: Pt and wife report their primary goal is to have good healthcare support after d/c.  Community Resources [employer] Scripts: None Premorbid Home Care/DME Agencies: None Transportation available at discharge: yes  Discharge Planning Living Arrangements: Spouse/significant other Support Systems: Spouse/significant other, Other relatives, Water engineer, Social worker community Type of Residence: Private residence [employer] Resources: Teacher, adult education Resources: Family Support Living Expenses: Rent Money Management: Patient, Spouse Does the patient have any problems obtaining your medications?: Yes (Describe) (no insurance) Home Management: wife is primary caretaker of the home Patient/Family Preliminary Plans: Pt to d/c home with his wife as primary caregiver Social Work Anticipated Follow Up Needs: HH/OP, Other (comment) ([facility] for primary care and med assistance.) Expected length of stay: 10-14 days  Clinical Impression Very pleasant, quiet gentleman originally from [place] but here in [place] since 1996.  Wife at bedside and very involved in interview process - often answering questions for pt.  They have a good understanding that pt's health, DM and renal management needs are high and very hopeful that this SW can  assist with ongoing medical care needs.  Much discussion about our local resources and about possible SSD app while here (if MD feels appropriate.)  Pt very motivated and making good gains with txs.  Will follow for all support needs.  Jezelle Gullick 09/11/2015, 4:22 PM

## 2015-09-11 NOTE — Progress Notes (Signed)
Physical Therapy Session Note  Patient Details  Name: Russell Price MRN: [id number] Date of Birth: Oct 06, 1960  Today's Date: 09/11/2015 PT Individual Time: 1030-1116 PT Individual Time Calculation (min): 46 min   Short Term Goals: Week 1:  PT Short Term Goal 1 (Week 1): Pt will perform bed mobility with SBA and LRAD PT Short Term Goal 2 (Week 1): Pt will perform transfers with SBA and LRAD PT Short Term Goal 3 (Week 1): Pt will perform gait with SBA and LRAD 150' PT Short Term Goal 4 (Week 1): Pt will perform 8 stairs SBA and LRAD  Skilled Therapeutic Interventions/Progress Updates:    Pt received in bed with wife present, agreeable to PT, denying c/o pain. Pt transferred out of bed with Mod I & ambulated from room to bathroom without AD & able to perform standing toileting task with close supervision & no loss of balance. Pt ambulated from room to gym without AD, lateral sway & unsteadiness on feet but no LOB. Focus of session was neuro re-ed. Utilized Diplomatic Services operational officer to challenge pt's balance anterior/posteriorly and laterally with eyes open & closed and no BUE support. Pt without LOB during task and more difficulty maintaining balance with anterior/posterior challenges. Utilized Wii soccer game to challenge balance further but pt presented with great difficulty weight shifting L<>R. Biodex limits of stability used while pt had BUE then progressing to 1UE support during task. Pt able to successfully shift weight in all directions but had greatest difficulty shifting posterior/L & required tactile cuing to do so. At end of session pt ambulated back to room & was left sitting on EOB with wife present, bed alarm on & all needs within reach.   Therapy Documentation Precautions:  Precautions Precautions: Fall Restrictions Weight Bearing Restrictions: No  Pain: Pain Assessment Pain Assessment: No/denies pain Pain Score: 0-No pain    See Function Navigator for Current Functional  Status.   Therapy/Group: Individual Therapy  [name] 09/11/2015, 12:38 PM

## 2015-09-11 NOTE — Progress Notes (Signed)
[facility] PHYSICAL MEDICINE & REHABILITATION     PROGRESS NOTE  Subjective/Complaints:  Patient seen lying in bed this morning. His wife does most of the talking. She has significant concerns about his safety at discharge and affordability of medications. She wants to know if he will be able to receive free medications so that he does not have any other hyperglycemic episode at home.  ROS: Denies CP, SOB, nausea, vomiting, diarrhea.  Objective: Vital Signs: Blood pressure 151/77, pulse 63, temperature 98.7 F (37.1 C), temperature source Oral, resp. rate 18, weight 64.6 kg (142 lb 6.7 oz), SpO2 100 %. No results found.  Recent Labs  09/11/15 0819  WBC 5.7  HGB 8.6*  HCT 25.6*  PLT 178   No results for input(s): NA, K, CL, GLUCOSE, BUN, CREATININE, CALCIUM in the last 72 hours.  Invalid input(s): CO CBG (last 3)   Recent Labs  09/10/15 1637 09/10/15 2057 09/11/15 0645  GLUCAP 139* 93 99    Wt Readings from Last 3 Encounters:  09/11/15 64.6 kg (142 lb 6.7 oz)  09/08/15 77.52 kg (170 lb 14.4 oz)  07/24/15 75.841 kg (167 lb 3.2 oz)    Physical Exam:  BP 151/77 mmHg  Pulse 63  Temp(Src) 98.7 F (37.1 C) (Oral)  Resp 18  Wt 64.6 kg (142 lb 6.7 oz)  SpO2 100% Constitutional: He appears well-developed. Well-nourished. NAD. HENT: Normocephalic. Healing abrasions Eyes: EOM are normal.  Cardiovascular: Normal rate and regular rhythm.  Respiratory: Effort normal and breath sounds normal. No respiratory distress.  GI: Soft. Bowel sounds are normal. He exhibits no distension.  Musculoskeletal: No edema. No tenderness. Neurological: He is alert and oriented.  Mood is flat but appropriate.  Follows simple commands.  Motor: 5/5 throughout Skin: Skin is warm and dry. Facial abrasions healing  Assessment/Plan: 1. Functional deficits secondary to DKA which require 3+ hours per day of interdisciplinary therapy in a comprehensive inpatient rehab setting. Physiatrist  is providing close team supervision and 24 hour management of active medical problems listed below. Physiatrist and rehab team continue to assess barriers to discharge/monitor patient progress toward functional and medical goals.  Function:  Bathing Bathing position   Position: Shower  Bathing parts Body parts bathed by patient: Right arm, Left arm, Chest, Abdomen, Front perineal area, Right upper leg, Left upper leg, Right lower leg, Left lower leg Body parts bathed by helper: Buttocks, Back  Bathing assist Assist Level: Touching or steadying assistance(Pt > 75%)      Upper Body Dressing/Undressing Upper body dressing                    Upper body assist Assist Level: Set up   Set up : To obtain clothing/put away  Lower Body Dressing/Undressing Lower body dressing   What is the patient wearing?: Pants, Non-skid slipper socks     Pants- Performed by patient: Thread/unthread left pants leg, Pull pants up/down, Fasten/unfasten pants Pants- Performed by helper: Thread/unthread right pants leg Non-skid slipper socks- Performed by patient: Don/doff right sock, Don/doff left sock                    Lower body assist Assist for lower body dressing: Touching or steadying assistance (Pt > 75%)      Toileting Toileting   Toileting steps completed by patient: Adjust clothing prior to toileting, Performs perineal hygiene, Adjust clothing after toileting      Toileting assist Assist level: Touching or steadying assistance (Pt.75%)  Transfers Chair/bed transfer     Chair/bed transfer assist level: Touching or steadying assistance (Pt > 75%) Chair/bed transfer assistive device: Bedrails     Locomotion Ambulation     Max distance: 100 Assist level: Touching or steadying assistance (Pt > 75%)   Wheelchair   Type: Manual Max wheelchair distance: 50 Assist Level: Moderate assistance (Pt 50 - 74%)  Cognition Comprehension Comprehension assist level: Understands  basic 50 - 74% of the time/ requires cueing 25 - 49% of the time  Expression Expression assist level: Expresses basic 50 - 74% of the time/requires cueing 25 - 49% of the time. Needs to repeat parts of sentences.  Social Interaction Social Interaction assist level: Interacts appropriately 50 - 74% of the time - May be physically or verbally inappropriate.  Problem Solving Problem solving assist level: Solves basic 25 - 49% of the time - needs direction more than half the time to initiate, plan or complete simple activities  Memory Memory assist level: Recognizes or recalls 50 - 74% of the time/requires cueing 25 - 49% of the time    Medical Problem List and Plan: 1. Debilitation with acute encephalopathy secondary to DKA  Continue CIR 2. DVT Prophylaxis/Anticoagulation: Subcutaneous heparin. Monitor platelet counts of any signs of bleeding 3. Pain Management: Tylenol as needed 4. Diabetes mellitus of peripheral neuropathy. Latest hemoglobin A1c 8.5. Lantus insulin 20 units daily. Check blood sugars before meals and at bedtime. Diabetic teaching  Will continue to monitor 5. Neuropsych: This patient is capable of making decisions on his own behalf. 6. Skin/Wound Care: Routine skin checks 7. Fluids/Electrolytes/Nutrition: Routine I&O   Creatinine 3.67 on 4/24 8. Question seizure. Provoked from hyperglycemia. No seizure medications indicated per neurology services. EEG negative 9. Hypertension.   Norvasc 10 mg daily, Coreg 6.25 mg twice a day.   Chlorthalidone 25 started on 4/24  Monitor with increased mobility 10. Chronic renal insufficiency. Baseline creatinine 3. 95.   Creatinine 3.67 on 4/24  11. Sphenoid sinusitis identified on MRI. Diflucan 7 days, to be completed on 4/28. 12. Anemia  Hb 8.6 on 4/24  Will cont to monitor  LOS (Days) 3 A FACE TO FACE EVALUATION WAS PERFORMED  Chasitie Passey Lorie Phenix 09/11/2015 9:08 AM

## 2015-09-12 ENCOUNTER — Inpatient Hospital Stay (HOSPITAL_COMMUNITY): Payer: Medicaid Other | Admitting: Occupational Therapy

## 2015-09-12 ENCOUNTER — Inpatient Hospital Stay (HOSPITAL_COMMUNITY): Payer: Medicaid Other | Admitting: Speech Pathology

## 2015-09-12 ENCOUNTER — Inpatient Hospital Stay (HOSPITAL_COMMUNITY): Payer: Self-pay | Admitting: Physical Therapy

## 2015-09-12 LAB — CBC WITH DIFFERENTIAL/PLATELET
BASOS PCT: 0 %
Basophils Absolute: 0 10*3/uL (ref 0.0–0.1)
Eosinophils Absolute: 0.1 10*3/uL (ref 0.0–0.7)
Eosinophils Relative: 2 %
HEMATOCRIT: 24.3 % — AB (ref 39.0–52.0)
Hemoglobin: 8.1 g/dL — ABNORMAL LOW (ref 13.0–17.0)
Lymphocytes Relative: 24 %
Lymphs Abs: 1.2 10*3/uL (ref 0.7–4.0)
MCH: 26.9 pg (ref 26.0–34.0)
MCHC: 33.3 g/dL (ref 30.0–36.0)
MCV: 80.7 fL (ref 78.0–100.0)
MONO ABS: 0.6 10*3/uL (ref 0.1–1.0)
MONOS PCT: 11 %
Neutro Abs: 3.2 10*3/uL (ref 1.7–7.7)
Neutrophils Relative %: 63 %
Platelets: 169 10*3/uL (ref 150–400)
RBC: 3.01 MIL/uL — ABNORMAL LOW (ref 4.22–5.81)
RDW: 12.3 % (ref 11.5–15.5)
WBC: 5.1 10*3/uL (ref 4.0–10.5)

## 2015-09-12 LAB — BASIC METABOLIC PANEL
Anion gap: 10 (ref 5–15)
BUN: 45 mg/dL — AB (ref 6–20)
CALCIUM: 8.6 mg/dL — AB (ref 8.9–10.3)
CO2: 22 mmol/L (ref 22–32)
CREATININE: 3.71 mg/dL — AB (ref 0.61–1.24)
Chloride: 107 mmol/L (ref 101–111)
GFR calc non Af Amer: 17 mL/min — ABNORMAL LOW (ref >60)
GFR, EST AFRICAN AMERICAN: 19 mL/min — AB (ref >60)
GLUCOSE: 161 mg/dL — AB (ref 65–99)
Potassium: 4.5 mmol/L (ref 3.5–5.1)
Sodium: 139 mmol/L (ref 135–145)

## 2015-09-12 LAB — GLUCOSE, CAPILLARY
GLUCOSE-CAPILLARY: 287 mg/dL — AB (ref 65–99)
GLUCOSE-CAPILLARY: 70 mg/dL (ref 65–99)
GLUCOSE-CAPILLARY: 162 mg/dL — AB (ref 65–99)
Glucose-Capillary: 150 mg/dL — ABNORMAL HIGH (ref 65–99)
Glucose-Capillary: 36 mg/dL — CL (ref 65–99)

## 2015-09-12 MED ORDER — DEXTROSE INFANT ORAL GEL 40%
0.5000 mL/kg | ORAL | Status: AC | PRN
  Administered 2015-09-12: 32.25 mL via BUCCAL

## 2015-09-12 MED ORDER — GLUCOSE 40 % PO GEL
ORAL | Status: AC
  Filled 2015-09-12: qty 1

## 2015-09-12 NOTE — Progress Notes (Signed)
Speech Language Pathology Daily Session Note  Patient Details  Name: Russell Price MRN: [id number] Date of Birth: 1961/05/08  Today's Date: 09/12/2015 SLP Individual Time: [id number] SLP Individual Time Calculation (min): 60 min  Short Term Goals: Week 1: SLP Short Term Goal 1 (Week 1): Patient will consume current diet without overt s/s of aspiration with Mod I for use of swallow strategies.  SLP Short Term Goal 2 (Week 1): Patient will demonstrate sustained attention to functional tasks for ~10 minutes with Min A verbal cues for redirection.  SLP Short Term Goal 3 (Week 1): Patient will demonstrate functional problem solving for basic tasks with Min A verbal cues. SLP Short Term Goal 4 (Week 1): Patient will identify 2 physical and 2 cognitive impairments with Min A question and verbal cues.  SLP Short Term Goal 5 (Week 1): Patient will recall 2 events from previous therapy sessions with Min A question and verbal cues.   Skilled Therapeutic Interventions: Skilled treatment session focused on addressing cognition goals. Initially patient unable to identify and current deficits.  SLP facilitated session by providing Mod assist verbal and visual cues for initiation of moderately complex functional tasks.  Patient self-monitored and corrected errors during construction of 3-D puzzles with Max assist verbal and visual cues.  Following task patient able to verbalize 1 cognitive deficit.  Patient sustained attention to task for ~30 minutes with Min verbal cues for redirection. Continue with current plan of care.    Function:  Cognition Comprehension Comprehension assist level: Understands basic 75 - 89% of the time/ requires cueing 10 - 24% of the time  Expression   Expression assist level: Expresses basic 50 - 74% of the time/requires cueing 25 - 49% of the time. Needs to repeat parts of sentences.  Social Interaction Social Interaction assist level: Interacts appropriately 75 - 89% of the time  - Needs redirection for appropriate language or to initiate interaction.  Problem Solving Problem solving assist level: Solves basic 25 - 49% of the time - needs direction more than half the time to initiate, plan or complete simple activities  Memory Memory assist level: Recognizes or recalls 25 - 49% of the time/requires cueing 50 - 75% of the time    Pain Pain Assessment Pain Assessment: 0-10 Pain Score: 4  Pain Type: Acute pain Pain Location: Hand Pain Descriptors / Indicators: Headache Pain Onset: Gradual Pain Intervention(s): RN made aware Multiple Pain Sites: No  Therapy/Group: Individual Therapy  Carmelia Roller., CCC-SLP [id number]  [place] 09/12/2015, 2:59 PM

## 2015-09-12 NOTE — Progress Notes (Signed)
[place] PHYSICAL MEDICINE & REHABILITATION     PROGRESS NOTE  Subjective/Complaints:  Patient sitting up at the edge of his bed being fed by his wife, however upon entering wife gives patient reports to feed himself. Wife again has questions about discharge and appropriate medications and reminders of lack of insurance.  ROS: Denies CP, SOB, nausea, vomiting, diarrhea.  Objective: Vital Signs: Blood pressure 132/70, pulse 59, temperature 98.7 F (37.1 C), temperature source Oral, resp. rate 16, weight 64.6 kg (142 lb 6.7 oz), SpO2 100 %. No results found.  Recent Labs  09/11/15 0819 09/12/15 0711  WBC 5.7 5.1  HGB 8.6* 8.1*  HCT 25.6* 24.3*  PLT 178 169    Recent Labs  09/11/15 0819  NA 140  K 4.4  CL 107  GLUCOSE 171*  BUN 41*  CREATININE 3.67*  CALCIUM 8.7*   CBG (last 3)   Recent Labs  09/11/15 1646 09/11/15 2128 09/12/15 0641  GLUCAP 200* 77 150*    Wt Readings from Last 3 Encounters:  09/11/15 64.6 kg (142 lb 6.7 oz)  09/08/15 77.52 kg (170 lb 14.4 oz)  07/24/15 75.841 kg (167 lb 3.2 oz)    Physical Exam:  BP 132/70 mmHg  Pulse 59  Temp(Src) 98.7 F (37.1 C) (Oral)  Resp 16  Wt 64.6 kg (142 lb 6.7 oz)  SpO2 100% Constitutional: He appears well-developed. Well-nourished. NAD. HENT: Normocephalic. Healing abrasions Eyes: EOM are normal.  Cardiovascular: Normal rate and regular rhythm.  Respiratory: Effort normal and breath sounds normal. No respiratory distress.  GI: Soft. Bowel sounds are normal. He exhibits no distension.  Musculoskeletal: No edema. No tenderness. Neurological: He is alert and oriented.  Mood is flat but appropriate.  Follows simple commands.  Motor: 5/5 throughout Skin: Skin is warm and dry. Facial abrasions healing  Assessment/Plan: 1. Functional deficits secondary to DKA which require 3+ hours per day of interdisciplinary therapy in a comprehensive inpatient rehab setting. Physiatrist is providing close team  supervision and 24 hour management of active medical problems listed below. Physiatrist and rehab team continue to assess barriers to discharge/monitor patient progress toward functional and medical goals.  Function:  Bathing Bathing position   Position: Shower  Bathing parts Body parts bathed by patient: Right arm, Left arm, Chest, Abdomen, Front perineal area, Right upper leg, Left upper leg, Right lower leg, Left lower leg Body parts bathed by helper: Buttocks, Back  Bathing assist Assist Level: Touching or steadying assistance(Pt > 75%)      Upper Body Dressing/Undressing Upper body dressing                    Upper body assist Assist Level: Set up   Set up : To obtain clothing/put away  Lower Body Dressing/Undressing Lower body dressing   What is the patient wearing?: Pants, Non-skid slipper socks     Pants- Performed by patient: Thread/unthread left pants leg, Pull pants up/down, Fasten/unfasten pants Pants- Performed by helper: Thread/unthread right pants leg Non-skid slipper socks- Performed by patient: Don/doff right sock, Don/doff left sock                    Lower body assist Assist for lower body dressing: Touching or steadying assistance (Pt > 75%)      Toileting Toileting   Toileting steps completed by patient: Adjust clothing prior to toileting, Performs perineal hygiene, Adjust clothing after toileting Toileting steps completed by helper: Adjust clothing prior to toileting, Performs perineal  hygiene, Adjust clothing after toileting (per Lennette Bihari, NT report)    Toileting assist Assist level: Touching or steadying assistance (Pt.75%)   Transfers Chair/bed transfer     Chair/bed transfer assist level: Touching or steadying assistance (Pt > 75%) Chair/bed transfer assistive device: Bedrails     Locomotion Ambulation     Max distance: 175 Assist level: Touching or steadying assistance (Pt > 75%)   Wheelchair   Type: Manual Max wheelchair  distance: 50 Assist Level: Moderate assistance (Pt 50 - 74%)  Cognition Comprehension Comprehension assist level: Understands basic 50 - 74% of the time/ requires cueing 25 - 49% of the time  Expression Expression assist level: Expresses basic 50 - 74% of the time/requires cueing 25 - 49% of the time. Needs to repeat parts of sentences.  Social Interaction Social Interaction assist level: Interacts appropriately 75 - 89% of the time - Needs redirection for appropriate language or to initiate interaction.  Problem Solving Problem solving assist level: Solves basic 25 - 49% of the time - needs direction more than half the time to initiate, plan or complete simple activities  Memory Memory assist level: Recognizes or recalls 25 - 49% of the time/requires cueing 50 - 75% of the time    Medical Problem List and Plan: 1. Debilitation with acute encephalopathy secondary to DKA  Continue CIR 2. DVT Prophylaxis/Anticoagulation: Subcutaneous heparin. Monitor platelet counts of any signs of bleeding 3. Pain Management: Tylenol as needed 4. Diabetes mellitus of peripheral neuropathy. Latest hemoglobin A1c 8.5. Lantus insulin 20 units daily. Check blood sugars before meals and at bedtime. Diabetic teaching  Will continue to monitor, Overall improved control 5. Neuropsych: This patient is capable of making decisions on his own behalf. 6. Skin/Wound Care: Routine skin checks 7. Fluids/Electrolytes/Nutrition: Routine I&O  8. Question seizure. Provoked from hyperglycemia. No seizure medications indicated per neurology services. EEG negative 9. Hypertension.   Norvasc 10 mg daily, Coreg 6.25 mg twice a day.   Chlorthalidone 25 started on 4/24  Monitor with increased mobility 10. Chronic renal insufficiency. Baseline creatinine 3. 95.   Creatinine 3.01 on 4/25  11. Sphenoid sinusitis identified on MRI. Diflucan 7 days, to be completed on 4/28. 12. Anemia  Hb 8.6 on 4/24  Will cont to monitor  LOS  (Days) 4 A FACE TO FACE EVALUATION WAS PERFORMED  Ethanjames Fontenot Lorie Phenix 09/12/2015 8:50 AM

## 2015-09-12 NOTE — Progress Notes (Signed)
Physical Therapy Session Note  Patient Details  Name: Russell Price MRN: [id number] Date of Birth: [birth year]  Today's Date: 09/12/2015 PT Individual Time: 1000-1108 PT Individual Time Calculation (min): 68 min   Short Term Goals: Week 1:  PT Short Term Goal 1 (Week 1): Pt will perform bed mobility with SBA and LRAD PT Short Term Goal 2 (Week 1): Pt will perform transfers with SBA and LRAD PT Short Term Goal 3 (Week 1): Pt will perform gait with SBA and LRAD 150' PT Short Term Goal 4 (Week 1): Pt will perform 8 stairs SBA and LRAD  Skilled Therapeutic Interventions/Progress Updates:    Pt received resting in bed c/o 3/10 HA pain (RN in to medicate) and agreeable to therapy session.  Session focus on high level balance, dynamic gait, and cognitive tasks.  Pt amb throughout unit without AD and supervision.  PT instructed pt in NMR activities for increased ankle strategies and equal weight bearing through forefoot/heel.  Pt stood on wedge and moved horseshoes from table>basketball rim>therapy mat focus on shifting weight to reach horseshoes without moving feet.  Pt stood on dynadisk while performing cognitive task of matching nuts/bolts through appropriate sized holes.  High level gait training with close supervision as follows: 6x40' while boxing, x500' while bouncing basketball. Nustep x5 minutes for reciprocal stepping pattern and attention to task.  Pt returned to room focus on path finding.  Pt was able to locate room, but question amount of verbal cues wife was giving pt as she was speaking in a different language.  Pt positioned in bed with call bell in reach and needs met.   Therapy Documentation Precautions:  Precautions Precautions: Fall Restrictions Weight Bearing Restrictions: No   See Function Navigator for Current Functional Status.   Therapy/Group: Individual Therapy  Earnest Conroy Penven-Crew 09/12/2015, 11:32 AM

## 2015-09-12 NOTE — Progress Notes (Signed)
Hypoglycemic Event  CBG: 36  Treatment: 1 tube instant glucose  Symptoms: Sweaty  Follow-up CBG: Time:1708 CBG Result:70  Possible Reasons for Event: Unknown  Comments/MD notified:Dan Angiulli, PA    Milus Mallick

## 2015-09-12 NOTE — Progress Notes (Signed)
Occupational Therapy Session Note  Patient Details  Name: Russell Price MRN: [id number] Date of Birth: November 11, 1960  Today's Date: 09/12/2015 OT Individual Time: 1300-1353 OT Individual Time Calculation (min): 53 min    Short Term Goals: Week 1:  OT Short Term Goal 1 (Week 1): Pt will be supervision with grooming OT Short Term Goal 2 (Week 1): Pt will be supervision with bathing OT Short Term Goal 3 (Week 1): Pt will be supervision with bathing OT Short Term Goal 4 (Week 1): Pt will be supervision with dressing OT Short Term Goal 5 (Week 1): Pt will be supervision with toilet transfer  Skilled Therapeutic Interventions/Progress Updates:  Upon entering the room, pt supine in bed with wife present in the room. Pt with no c/o,signs, or symptoms of pain this session. Pt ambulating without use of AD and close supervision 300' and down 2 flights of stairs to main lobby with use of 1 hand rail and increased time. Pt able to navigate way back to room with min verbal cues from therapist. However, unsure of assist level as wife speaking to pt in another language. Once pt returns to room, pt given instructions for bed making with close supervision in the room. Call bell and all needed items within reach upon exiting the room.   Therapy Documentation Precautions:  Precautions Precautions: Fall Restrictions Weight Bearing Restrictions: No General:   Vital Signs: Therapy Vitals BP: 137/67 mmHg Pain: Pain Assessment Pain Assessment: 0-10 Pain Score: 0-No pain    See Function Navigator for Current Functional Status.   Therapy/Group: Individual Therapy  Phineas Semen 09/12/2015, 1:57 PM

## 2015-09-13 ENCOUNTER — Inpatient Hospital Stay (HOSPITAL_COMMUNITY): Payer: Self-pay | Admitting: Physical Therapy

## 2015-09-13 ENCOUNTER — Inpatient Hospital Stay (HOSPITAL_COMMUNITY): Payer: Medicaid Other | Admitting: Speech Pathology

## 2015-09-13 ENCOUNTER — Inpatient Hospital Stay (HOSPITAL_COMMUNITY): Payer: Medicaid Other | Admitting: Occupational Therapy

## 2015-09-13 DIAGNOSIS — E11649 Type 2 diabetes mellitus with hypoglycemia without coma: Secondary | ICD-10-CM | POA: Insufficient documentation

## 2015-09-13 DIAGNOSIS — E119 Type 2 diabetes mellitus without complications: Secondary | ICD-10-CM | POA: Insufficient documentation

## 2015-09-13 LAB — GLUCOSE, CAPILLARY
GLUCOSE-CAPILLARY: 319 mg/dL — AB (ref 65–99)
GLUCOSE-CAPILLARY: 87 mg/dL (ref 65–99)
GLUCOSE-CAPILLARY: 140 mg/dL — AB (ref 65–99)
Glucose-Capillary: 204 mg/dL — ABNORMAL HIGH (ref 65–99)
Glucose-Capillary: 368 mg/dL — ABNORMAL HIGH (ref 65–99)

## 2015-09-13 NOTE — Progress Notes (Signed)
[place] PHYSICAL MEDICINE & REHABILITATION     PROGRESS NOTE  Subjective/Complaints:  Patient seen sitting up at the edge of his bed eating breakfast. Have the same conversation with patient's wife regarding discharge, safety, appropriate medications.  ROS: Denies CP, SOB, nausea, vomiting, diarrhea.  Objective: Vital Signs: Blood pressure 140/61, pulse 63, temperature 98.6 F (37 C), temperature source Oral, resp. rate 16, weight 64.6 kg (142 lb 6.7 oz), SpO2 99 %. No results found.  Recent Labs  09/11/15 0819 09/12/15 0711  WBC 5.7 5.1  HGB 8.6* 8.1*  HCT 25.6* 24.3*  PLT 178 169    Recent Labs  09/11/15 0819 09/12/15 0711  NA 140 139  K 4.4 4.5  CL 107 107  GLUCOSE 171* 161*  BUN 41* 45*  CREATININE 3.67* 3.71*  CALCIUM 8.7* 8.6*   CBG (last 3)   Recent Labs  09/12/15 1711 09/12/15 2112 09/13/15 0650  GLUCAP 70 162* 204*    Wt Readings from Last 3 Encounters:  09/11/15 64.6 kg (142 lb 6.7 oz)  09/08/15 77.52 kg (170 lb 14.4 oz)  07/24/15 75.841 kg (167 lb 3.2 oz)    Physical Exam:  BP 140/61 mmHg  Pulse 63  Temp(Src) 98.6 F (37 C) (Oral)  Resp 16  Wt 64.6 kg (142 lb 6.7 oz)  SpO2 99% Constitutional: He appears well-developed. Well-nourished. NAD. HENT: Normocephalic. Healing abrasions Eyes: EOM are normal.  Cardiovascular: Normal rate and regular rhythm.  Respiratory: Effort normal and breath sounds normal. No respiratory distress.  GI: Soft. Bowel sounds are normal. He exhibits no distension.  Musculoskeletal: No edema. No tenderness. Neurological: He is alert and oriented.  Mood is flat but appropriate.  Follows simple commands.  Motor: 5/5 throughout Skin: Skin is warm and dry. Facial abrasions healing  Assessment/Plan: 1. Functional deficits secondary to DKA which require 3+ hours per day of interdisciplinary therapy in a comprehensive inpatient rehab setting. Physiatrist is providing close team supervision and 24 hour  management of active medical problems listed below. Physiatrist and rehab team continue to assess barriers to discharge/monitor patient progress toward functional and medical goals.  Function:  Bathing Bathing position   Position: Shower  Bathing parts Body parts bathed by patient: Right arm, Left arm, Chest, Abdomen, Front perineal area, Right upper leg, Left upper leg, Right lower leg, Left lower leg Body parts bathed by helper: Buttocks, Back  Bathing assist Assist Level: Touching or steadying assistance(Pt > 75%)      Upper Body Dressing/Undressing Upper body dressing                    Upper body assist Assist Level: Set up   Set up : To obtain clothing/put away  Lower Body Dressing/Undressing Lower body dressing   What is the patient wearing?: Pants, Non-skid slipper socks     Pants- Performed by patient: Thread/unthread left pants leg, Pull pants up/down, Fasten/unfasten pants Pants- Performed by helper: Thread/unthread right pants leg Non-skid slipper socks- Performed by patient: Don/doff right sock, Don/doff left sock                    Lower body assist Assist for lower body dressing: Touching or steadying assistance (Pt > 75%)      Toileting Toileting   Toileting steps completed by patient: Adjust clothing prior to toileting, Performs perineal hygiene, Adjust clothing after toileting Toileting steps completed by helper: Adjust clothing prior to toileting, Performs perineal hygiene, Adjust clothing after toileting (per  Lennette Bihari, NT report)    Toileting assist Assist level: Touching or steadying assistance (Pt.75%)   Transfers Chair/bed transfer   Chair/bed transfer method: Ambulatory Chair/bed transfer assist level: Supervision or verbal cues Chair/bed transfer assistive device: Bedrails     Locomotion Ambulation     Max distance: 500+ Assist level: Supervision or verbal cues   Wheelchair   Type: Manual Max wheelchair distance: 50 Assist  Level: Moderate assistance (Pt 50 - 74%)  Cognition Comprehension Comprehension assist level: Understands basic 50 - 74% of the time/ requires cueing 25 - 49% of the time  Expression Expression assist level: Expresses basic 50 - 74% of the time/requires cueing 25 - 49% of the time. Needs to repeat parts of sentences.  Social Interaction Social Interaction assist level: Interacts appropriately 75 - 89% of the time - Needs redirection for appropriate language or to initiate interaction.  Problem Solving Problem solving assist level: Solves basic 25 - 49% of the time - needs direction more than half the time to initiate, plan or complete simple activities  Memory Memory assist level: Recognizes or recalls 25 - 49% of the time/requires cueing 50 - 75% of the time    Medical Problem List and Plan: 1. Debilitation with acute encephalopathy secondary to DKA  Continue CIR 2. DVT Prophylaxis/Anticoagulation: Subcutaneous heparin. Monitor platelet counts of any signs of bleeding 3. Pain Management: Tylenol as needed 4. Diabetes mellitus of peripheral neuropathy. Latest hemoglobin A1c 8.5. Lantus insulin 20 units daily. Check blood sugars before meals and at bedtime. Diabetic teaching  Will continue to monitor, Overall improved control-1 hypoglycemic bleeding yesterday. Will continue to monitor 5. Neuropsych: This patient is capable of making decisions on his own behalf. 6. Skin/Wound Care: Routine skin checks 7. Fluids/Electrolytes/Nutrition: Routine I&O  8. Question seizure. Provoked from hyperglycemia. No seizure medications indicated per neurology services. EEG negative 9. Hypertension.   Norvasc 10 mg daily, Coreg 6.25 mg twice a day.   Chlorthalidone 25 started on 4/24, With improvement  Monitor with increased mobility 10. Chronic renal insufficiency. Baseline creatinine 3. 95.   Creatinine 3.71 on 4/25  Will encourage fluid intake 11. Sphenoid sinusitis identified on MRI. Diflucan 7 days,  to be completed on 4/28. 12. Anemia  Hb  8.1 on 4/25   Will cont to monitor  LOS (Days) 5 A FACE TO FACE EVALUATION WAS PERFORMED  Regina Ganci Lorie Phenix 09/13/2015 9:38 AM

## 2015-09-13 NOTE — Progress Notes (Signed)
Physical Therapy Session Note  Patient Details  Name: Russell Price MRN: [id number] Date of Birth: [birth year]  Today's Date: 09/13/2015 PT Individual Time: 1306-1400 PT Individual Time Calculation (min): 54 min   Short Term Goals: Week 1:  PT Short Term Goal 1 (Week 1): Pt will perform bed mobility with SBA and LRAD PT Short Term Goal 2 (Week 1): Pt will perform transfers with SBA and LRAD PT Short Term Goal 3 (Week 1): Pt will perform gait with SBA and LRAD 150' PT Short Term Goal 4 (Week 1): Pt will perform 8 stairs SBA and LRAD  Skilled Therapeutic Interventions/Progress Updates:    Pt received resting in bed with no c/o pain and agreeable to therapy session.  Gait training to/from therapy gym with supervision, focus on path finding. Pt able to locate gym with confirmation verbal cues.  NMR for improved postural control and high level balance.  Pt performed 3 trials of limits of stability on Bi[employer] improving score by 12% from first to third trial and 2 trials of maze for practice weight shifting.  Pt performed 2 pipe tree designs while standing on BOSU ball with initial steady assist while pt attained balance and close supervision during task.  Pt required increased time to initiate and complete first pipe tree design and significantly more time to complete second design while also requiring mod/max multimodal cues from therapist.  Of note, wife present during second pipe tree design and constantly speaking to pt while he was attempting to work.  Plan for family education during subsequent sessions on providing appropriate cues for supervision level mobility at d/c. Pt amb back to room at end of session with supervision focus on pathfinding requiring mod verbal cues to use visual cues to find room.  Pt positioned in bed with call bell in reach, bed alarm activated, and needs met.   Therapy Documentation Precautions:  Precautions Precautions: Fall Restrictions Weight Bearing Restrictions:  No   See Function Navigator for Current Functional Status.   Therapy/Group: Individual Therapy  Earnest Conroy Penven-Crew 09/13/2015, 3:19 PM

## 2015-09-13 NOTE — Plan of Care (Signed)
Problem: RH Bed Mobility Goal: LTG Patient will perform bed mobility with assist (PT) LTG: Patient will perform bed mobility with assistance, with/without cues (PT).  Downgraded all goals to supervision 2/2 ongoing cognitive impairments.

## 2015-09-13 NOTE — Progress Notes (Signed)
RN educated patient's spouse with insulin administration and discussed discharge/home concerns.  Patient wife expressed concerns of being able to safely provide for her husband's medical needs given lack of supplies at home. Wife asked about possibility of BP cuff, CBG machine, and test strips.  RN provided support and would pass along information to SW. Continue to monitor patient.

## 2015-09-13 NOTE — Plan of Care (Signed)
Problem: RH Balance Goal: LTG Patient will maintain dynamic standing with ADLs (OT) LTG: Patient will maintain dynamic standing balance with assist during activities of daily living (OT)  Downgraded secondary to cognition  Problem: RH Grooming Goal: LTG Patient will perform grooming w/assist,cues/equip (OT) LTG: Patient will perform grooming with assist, with/without cues using equipment (OT)  Downgraded secondary to cognitive deficits  Problem: RH Dressing Goal: LTG Patient will perform upper body dressing (OT) LTG Patient will perform upper body dressing with assist, with/without cues (OT).  Downgraded secondary to cognitive deficits Goal: LTG Patient will perform lower body dressing w/assist (OT) LTG: Patient will perform lower body dressing with assist, with/without cues in positioning using equipment (OT)  Downgraded secondary to cognitive deficits  Problem: RH Toileting Goal: LTG Patient will perform toileting w/assist, cues/equip (OT) LTG: Patient will perform toiletiing (clothes management/hygiene) with assist, with/without cues using equipment (OT)  Downgraded secondary to cognitive deficits  Problem: RH Toilet Transfers Goal: LTG Patient will perform toilet transfers w/assist (OT) LTG: Patient will perform toilet transfers with assist, with/without cues using equipment (OT)  Downgraded secondary to cognitive deficits  Problem: RH Attention Goal: LTG Patient will demonstrate focused/sustained (OT) LTG: Patient will demonstrate focused/sustained/selective/alternating/divided attention during functional activities in specific environment with assist for # of minutes (OT)  Downgraded secondary to cognitive deficits  Problem: RH Awareness Goal: LTG: Patient will demonstrate intellectual/emergent (OT) LTG: Patient will demonstrate intellectual/emergent/anticipatory awareness with assist during a functional activity (OT)  Downgraded secondary to slow progress

## 2015-09-13 NOTE — Hospital Discharge Follow-Up (Signed)
Transitional Care Clinic Care Coordination Note:  Admit date:  09/08/15 Discharge date: TBD Discharge Disposition: Home with spouse. Patient contact: [phone] (home); [phone] (mobile)  This Case Manager reviewed patient's EMR and determined patient would benefit from post-discharge medical management and chronic care management services through the [facility]. Patient has a history of essential hypertension, chronic kidney disease-Stage IV, diabetes mellitus. Hospitalized for DKA complicated by acute on chronic renal failure and acute respiratory failure. This Case Manager met with patient to discuss the services and medical management that can be provided at the [facility]. Patient verbalized understanding and agreed to receive post-discharge care at the [facility].   Patient scheduled for Transitional Care appointment on 09/21/15 at 1130 with Dr. Jarold Song. Also scheduled patient an appointment on 09/21/15 at 1100 with Nicoletta Ba, Clinical Pharmacist at [facility] and [facility] for continued diabetes education.  Clinic information and appointment time provided to patient. Appointment information also placed on AVS.  Assessment:       Home Environment: Patient lives with his spouse, Hulan Fray, in a private residence.       Support System: Spouse       Level of functioning: Previously independent with daily activities. Currently receiving inpatient rehabilitation for debilitation with acute encephalopathy secondary to DKA.       Home DME: Patient does indicate he has a glucometer but does not have any test strips. Spoke with [employer]. SW and informed her that patient may benefit from [employer] and testing supplies which are available at [employer] and [employer]. Per Lennart Pall, 09/16/15 is anticipated discharge date. Informed her that [employer] and [facility] not open on weekends. Scripts for discharge  medications/diabetes testing supplies will need to be written on 09/15/15 if patient using [facility] and [facility]. She verbalized understanding.       Home care services: none       Transportation: Patient typically drives to his medical appointments.        Food/Nutrition: Patient indicates he receives $200 of Food Stamps/month. Spouse indicates additional food resources needed. Lennart Pall, SW indicated she would give patient a list of food pantries and places free meals are served in [place], [place].        Medications: Patient indicates he was previously getting medications filled at [facility]. Spouse indicates additional resources needed as test strips were too expensive. Thoroughly discussed pharmacy resources available at [facility]. Patient and spouse appreciative of information.        Identified Barriers: problems affording medications/diabetes testing supplies-(discussed medication resources available at [facility] and [facility]), additional food resources needed-(SW to provide list of food pantries and places in [place], [place] to obtain free meals). Additional barriers include: lack of insurance-(informed patient he may benefit from meeting with Development worker, community at [place] to determine if eligible for [employer] or [employer]. Financial Counselor walk-in hours given to patient and spouse. May also benefit from applying for Medicaid), lack of PCP, lack of support system.        PCP: none. Patient able to designate Dr. Jarold Song as PCP or will need to establish care with a PCP at [facility] after thirty days of medical management, follow-up with the [facility] at [facility] and [facility].             Arranged services:  Lennart Pall, SW updated

## 2015-09-13 NOTE — Progress Notes (Signed)
Occupational Therapy Session Note  Patient Details  Name: Russell Price MRN: [id number] Date of Birth: April 22, 1961  Today's Date: 09/13/2015 OT Individual Time: [id number] OT Individual Time Calculation (min): 58 min    Short Term Goals: Week 1:  OT Short Term Goal 1 (Week 1): Pt will be supervision with grooming OT Short Term Goal 2 (Week 1): Pt will be supervision with bathing OT Short Term Goal 3 (Week 1): Pt will be supervision with bathing OT Short Term Goal 4 (Week 1): Pt will be supervision with dressing OT Short Term Goal 5 (Week 1): Pt will be supervision with toilet transfer  Skilled Therapeutic Interventions/Progress Updates:  Upon entering the room, pt supine in bed with wife present in the room. Pt performed supine >sit with mod I. Pt had BM while in bed and incontinent on bed and clothing items. Pt ambulating to bathroom with supervision and without use of AD to toilet. Pt performed toileting, clothing management, and hygiene with supervision as well. Pt performed shower transfer with steady assist. Bathing at shower level with sit <>stand with steady assist. OT providing cues for pt to sit for LB self care secondary to fall risk. Pt ambulating to sit on EOB for dressing tasks. Once dresses, pt ambulating to ADL apartment to make a cup of tea on stove. Pt required mod cues for sequencing, problem solving, and initiation of task. Pt obtaining pot from low cabinet with supervision to boil water for tea. Pt needing cues for safety with boiling water on stove. Once tea is poured, pt ambulating back to room while holding cup in right hand with supervision - steady assist. Pt returning to sit on EOB with wife present in room. Call bell and all needed items within reach upon exiting the room.   Therapy Documentation Precautions:  Precautions Precautions: Fall Restrictions Weight Bearing Restrictions: No Pain: Pain Assessment Pain Assessment: No/denies pain  See Function Navigator  for Current Functional Status.   Therapy/Group: Individual Therapy  Phineas Semen 09/13/2015, 12:27 PM

## 2015-09-13 NOTE — Progress Notes (Signed)
Speech Language Pathology Daily Session Note  Patient Details  Name: Russell Price MRN: [id number] Date of Birth: Jun 11, 1960  Today's Date: 09/13/2015  Session 1 SLP Individual Time: 1035-1130 SLP Individual Time Calculation (min): 55 min  Session 2 SLP Individual Time: 1430-1500 SLP Individual Time Calculation (min): 30 min   Short Term Goals: Week 1: SLP Short Term Goal 1 (Week 1): Patient will consume current diet without overt s/s of aspiration with Mod I for use of swallow strategies.  SLP Short Term Goal 2 (Week 1): Patient will demonstrate sustained attention to functional tasks for ~10 minutes with Min A verbal cues for redirection.  SLP Short Term Goal 3 (Week 1): Patient will demonstrate functional problem solving for basic tasks with Min A verbal cues. SLP Short Term Goal 4 (Week 1): Patient will identify 2 physical and 2 cognitive impairments with Min A question and verbal cues.  SLP Short Term Goal 5 (Week 1): Patient will recall 2 events from previous therapy sessions with Min A question and verbal cues.   Skilled Therapeutic Interventions: Session 1 Skilled treatment session focused on addressing cognitive and dysphagia goals. SLP facilitated accurate completion of math calculations at a basic level Mod verbal and visual cues to organize and initiate a solution.  Patient required Total assist to recognize errors.  Patient unable to recall anything from previous therapy session.  SLP also facilitated session with discussion regarding current deficits and patient was able to verbalize that his thinking is different but unable to identify specifically how.  SLP educated patient and spouse on current short term deficits and effective compensatory strategies.  Swallow goals addressed with skilled observation of patient consuming regular textures and thin liquids via straw.  Patient consumed large bites and consecutive sips with effective oral clearance and no overt s/s of aspiration.   Patient left with task to take notes on each of the following therapy sessions today.  Continue with current plan of care.   Session 2 Skilled treatment session focused on addressing cognition goals. Patient took notes of part of his therapy sessions for the day, which facilitated some carryover and recall of earlier events.  SLP facilitated session by providing a written aid/list of scavenger hunt items to locate on unit.  Patient able to locate locations with Mod assist verbal cues to utilize external aids.  However, at the end of the session patient required Max assist multimodal cues to navigate back to his room.  Noted need for increased from Supervision to [place] physical assist during the divided attention task of reading and walking.  Continue with current plan of care.    Eating Eating   Modified Consistency Diet: No Eating Assist Level: No help, No cues           Cognition Comprehension Comprehension assist level: Understands basic 50 - 74% of the time/ requires cueing 25 - 49% of the time  Expression   Expression assist level: Expresses basic 50 - 74% of the time/requires cueing 25 - 49% of the time. Needs to repeat parts of sentences.  Social Interaction Social Interaction assist level: Interacts appropriately 75 - 89% of the time - Needs redirection for appropriate language or to initiate interaction.  Problem Solving Problem solving assist level: Solves basic 50 - 74% of the time/requires cueing 25 - 49% of the time  Memory Memory assist level: Recognizes or recalls 25 - 49% of the time/requires cueing 50 - 75% of the time    Pain Pain Assessment  Pain Assessment: No/denies pain x2  Therapy/Group: Individual Therapy x2  Carmelia Roller., [place] [id number]  [place] 09/13/2015, 12:48 PM

## 2015-09-14 ENCOUNTER — Inpatient Hospital Stay (HOSPITAL_COMMUNITY): Payer: Medicaid Other | Admitting: Speech Pathology

## 2015-09-14 ENCOUNTER — Inpatient Hospital Stay (HOSPITAL_COMMUNITY): Payer: Medicaid Other | Admitting: Occupational Therapy

## 2015-09-14 ENCOUNTER — Inpatient Hospital Stay (HOSPITAL_COMMUNITY): Payer: Self-pay

## 2015-09-14 DIAGNOSIS — R7309 Other abnormal glucose: Secondary | ICD-10-CM | POA: Insufficient documentation

## 2015-09-14 LAB — GLUCOSE, CAPILLARY
Glucose-Capillary: 146 mg/dL — ABNORMAL HIGH (ref 65–99)
Glucose-Capillary: 300 mg/dL — ABNORMAL HIGH (ref 65–99)
Glucose-Capillary: 205 mg/dL — ABNORMAL HIGH (ref 65–99)
Glucose-Capillary: 219 mg/dL — ABNORMAL HIGH (ref 65–99)

## 2015-09-14 LAB — BASIC METABOLIC PANEL
Anion gap: 10 (ref 5–15)
BUN: 49 mg/dL — AB (ref 6–20)
CHLORIDE: 108 mmol/L (ref 101–111)
CO2: 23 mmol/L (ref 22–32)
Calcium: 9 mg/dL (ref 8.9–10.3)
Creatinine, Ser: 4.03 mg/dL — ABNORMAL HIGH (ref 0.61–1.24)
GFR calc Af Amer: 18 mL/min — ABNORMAL LOW (ref >60)
GFR calc non Af Amer: 15 mL/min — ABNORMAL LOW (ref >60)
GLUCOSE: 167 mg/dL — AB (ref 65–99)
POTASSIUM: 5.2 mmol/L — AB (ref 3.5–5.1)
Sodium: 141 mmol/L (ref 135–145)

## 2015-09-14 LAB — CBC WITH DIFFERENTIAL/PLATELET
Basophils Absolute: 0 10*3/uL (ref 0.0–0.1)
Basophils Relative: 0 %
EOS PCT: 2 %
Eosinophils Absolute: 0.1 10*3/uL (ref 0.0–0.7)
HEMATOCRIT: 26 % — AB (ref 39.0–52.0)
Hemoglobin: 8.8 g/dL — ABNORMAL LOW (ref 13.0–17.0)
LYMPHS ABS: 1.6 10*3/uL (ref 0.7–4.0)
LYMPHS PCT: 28 %
MCH: 27.8 pg (ref 26.0–34.0)
MCHC: 33.8 g/dL (ref 30.0–36.0)
MCV: 82 fL (ref 78.0–100.0)
MONO ABS: 0.3 10*3/uL (ref 0.1–1.0)
Monocytes Relative: 6 %
Neutro Abs: 3.6 10*3/uL (ref 1.7–7.7)
Neutrophils Relative %: 64 %
PLATELETS: 199 10*3/uL (ref 150–400)
RBC: 3.17 MIL/uL — AB (ref 4.22–5.81)
RDW: 12.6 % (ref 11.5–15.5)
WBC: 5.6 10*3/uL (ref 4.0–10.5)

## 2015-09-14 MED ORDER — ATORVASTATIN CALCIUM 40 MG PO TABS: 40.0000 mg | ORAL_TABLET | Freq: Every day | ORAL | Status: DC

## 2015-09-14 MED ORDER — CARVEDILOL 6.25 MG PO TABS: 6.2500 mg | ORAL_TABLET | Freq: Two times a day (BID) | ORAL | Status: DC

## 2015-09-14 MED ORDER — ASPIRIN 81 MG PO CHEW: 81.0000 mg | CHEWABLE_TABLET | Freq: Every day | ORAL | Status: DC

## 2015-09-14 MED ORDER — INSULIN GLARGINE 100 UNIT/ML ~~LOC~~ SOLN: 20.0000 [IU] | Freq: Every day | SUBCUTANEOUS | Status: DC

## 2015-09-14 MED ORDER — CHLORTHALIDONE 25 MG PO TABS: 25.0000 mg | ORAL_TABLET | Freq: Every day | ORAL | Status: DC

## 2015-09-14 MED ORDER — AMLODIPINE BESYLATE 10 MG PO TABS: 10.0000 mg | ORAL_TABLET | Freq: Every day | ORAL | Status: DC

## 2015-09-14 NOTE — Progress Notes (Signed)
Physical Therapy Session Note  Patient Details  Name: Russell Price MRN: [id number] Date of Birth: 10-05-1960  Today's Date: 09/14/2015 PT Individual Time:  1100- 1200 60 min     Short Term Goals: Week 1:  PT Short Term Goal 1 (Week 1): Pt will perform bed mobility with SBA and LRAD PT Short Term Goal 2 (Week 1): Pt will perform transfers with SBA and LRAD PT Short Term Goal 3 (Week 1): Pt will perform gait with SBA and LRAD 150' PT Short Term Goal 4 (Week 1): Pt will perform 8 stairs SBA and LRAD  Skilled Therapeutic Interventions/Progress Updates:    Patient seen with wife present.  Focus of treatment on gait with work on coordination, strength and ROM of L LE and improvements in gait, on cognition with way finding and finding items in a gift shop and on caregiver education about assist level for ambulation and cognition.  Patient ambulated to therapy gym 120; noting decreased L ankle DF with circumduction and lateral whip.  In therapy gym practiced floor transfer to floor mat with cues and visual demonstration pt performed with supervision using mat for UE support to lower and stand.  Wife reports performs at home for various activities.  Seated on floor mat work on L hip abduction ER for sitting cross legged stretching with min A 3 x 30 sec.  Patient participated in supine D2 PNF focus on ankle DF/PF and sequencing and timing.  Then performed heel walking and toe walking as well as tall kneeling walking forward and backward with facilitation at pelvis for L protraction/retraction.  Patient ambulated to gift shop with mod to max cues for directions including use of map on wall which he was unable to read (spouse reports due to not having his glasses).  In gift shop needed increased time and min cues to locate three items (cards, candy and jewelry).  Noted during ambulation to and from gift shop with improved L LE progression and heel strike with occasional min cues.  Patient would veer laterally  at times with self recovery no more than supervision level of assist given.  In therapy gym participated in forced use and coordination of L LE with kicking bean bags, then kicking soccer ball around cones, ball taps moving ball forward, backward and to sides with cues for L LE use and occasional minguard for safety.  Discussed with wife pt needing supervision for mobility due to difficulty with wayfinding, poor L side awareness with occasional cues for heel strike and decreased lateral whip as well as for general safety in the home.  She verbalized understanding (but needs further reinforcement).  Patient left in room in bed with wife in room able to attend to needs.  Encouraged her to assist with ambulation on unit as tolerated.   Therapy Documentation Precautions:  Precautions Precautions: Fall Restrictions Weight Bearing Restrictions: No  Pain: Pain Assessment Pain Assessment: No/denies pain    Exercises:  Other Exercises Other Exercises: standing stretch for heel cords bilat hanging off edge of step 3 x 30 sec hold manual facilitation for L lat weight shift and foot position Other Treatments: Treatments Neuromuscular Facilitation: Left;Lower Extremity;Activity to increase coordination;Forced use;Activity to increase grading;Activity to increase sustained activation;Activity to increase lateral weight shifting   See Function Navigator for Current Functional Status.   Therapy/Group: Individual Therapy  Reginia Naas  [place], [place] 09/14/2015  09/14/2015, 4:43 PM

## 2015-09-14 NOTE — Progress Notes (Signed)
Speech Language Pathology Daily Session Note  Patient Details  Name: Russell Price MRN: [id number] Date of Birth: 1960/09/21  Today's Date: 09/14/2015 SLP Individual Time: 1400-1430 SLP Individual Time Calculation (min): 30 min  Short Term Goals: Week 1: SLP Short Term Goal 1 (Week 1): Patient will consume current diet without overt s/s of aspiration with Mod I for use of swallow strategies.  SLP Short Term Goal 2 (Week 1): Patient will demonstrate sustained attention to functional tasks for ~10 minutes with Min A verbal cues for redirection.  SLP Short Term Goal 3 (Week 1): Patient will demonstrate functional problem solving for basic tasks with Min A verbal cues. SLP Short Term Goal 4 (Week 1): Patient will identify 2 physical and 2 cognitive impairments with Min A question and verbal cues.  SLP Short Term Goal 5 (Week 1): Patient will recall 2 events from previous therapy sessions with Min A question and verbal cues.   Skilled Therapeutic Interventions: Skilled ST session focused on cognitive goals. SLP facilitated session by providing Min A verbal cues for functional problem solving for basic tasks, recalling 4 events from previous therapy sessions and pt independently sustained attention to functional tasks for ~10 minutes. Pt able to discuss 2 physical impairments independently. Wife present and education provided. Pt left in bed with wife present, all needs within reach. Continue current plan of care.   Function:   Cognition Comprehension Comprehension assist level: Understands basic 75 - 89% of the time/ requires cueing 10 - 24% of the time  Expression   Expression assist level: Expresses basic 50 - 74% of the time/requires cueing 25 - 49% of the time. Needs to repeat parts of sentences.  Social Interaction Social Interaction assist level: Interacts appropriately 75 - 89% of the time - Needs redirection for appropriate language or to initiate interaction.  Problem Solving Problem  solving assist level: Solves basic 25 - 49% of the time - needs direction more than half the time to initiate, plan or complete simple activities  Memory Memory assist level: Recognizes or recalls 25 - 49% of the time/requires cueing 50 - 75% of the time    Pain Pain Assessment Pain Assessment: No/denies pain  Therapy/Group: Individual Therapy  Kioni Stahl 09/14/2015, 5:03 PM

## 2015-09-14 NOTE — Progress Notes (Signed)
[facility] PHYSICAL MEDICINE & REHABILITATION     PROGRESS NOTE  Subjective/Complaints:  Patient sitting up at the edge of his bed this morning. He states he is doing well. His wife restates that the need everything in order to discharge.  ROS: Denies CP, SOB, nausea, vomiting, diarrhea.  Objective: Vital Signs: Blood pressure 145/56, pulse 63, temperature 98.8 F (37.1 C), temperature source Oral, resp. rate 18, weight 66.2 kg (145 lb 15.1 oz), SpO2 98 %. No results found.  Recent Labs  09/12/15 0711 09/14/15 0557  WBC 5.1 5.6  HGB 8.1* 8.8*  HCT 24.3* 26.0*  PLT 169 199    Recent Labs  09/12/15 0711 09/14/15 0557  NA 139 141  K 4.5 5.2*  CL 107 108  GLUCOSE 161* 167*  BUN 45* 49*  CREATININE 3.71* 4.03*  CALCIUM 8.6* 9.0   CBG (last 3)   Recent Labs  09/13/15 1642 09/13/15 2027 09/14/15 0629  GLUCAP 87 140* 146*    Wt Readings from Last 3 Encounters:  09/14/15 66.2 kg (145 lb 15.1 oz)  09/08/15 77.52 kg (170 lb 14.4 oz)  07/24/15 75.841 kg (167 lb 3.2 oz)    Physical Exam:  BP 145/56 mmHg  Pulse 63  Temp(Src) 98.8 F (37.1 C) (Oral)  Resp 18  Wt 66.2 kg (145 lb 15.1 oz)  SpO2 98% Constitutional: He appears well-developed. Well-nourished. NAD. HENT: Normocephalic. Healing abrasions Eyes: EOM are normal.  Cardiovascular: Normal rate and regular rhythm.  Respiratory: Effort normal and breath sounds normal. No respiratory distress.  GI: Soft. Bowel sounds are normal. He exhibits no distension.  Musculoskeletal: No edema. No tenderness. Neurological: He is alert and oriented.  Mood is flat but appropriate.  Follows commands.  Motor: 5/5 throughout Skin: Skin is warm and dry. Facial abrasions healing  Assessment/Plan: 1. Functional deficits secondary to DKA which require 3+ hours per day of interdisciplinary therapy in a comprehensive inpatient rehab setting. Physiatrist is providing close team supervision and 24 hour management of active  medical problems listed below. Physiatrist and rehab team continue to assess barriers to discharge/monitor patient progress toward functional and medical goals.  Function:  Bathing Bathing position   Position: Shower  Bathing parts Body parts bathed by patient: Right arm, Left arm, Chest, Abdomen, Front perineal area, Right upper leg, Left upper leg, Right lower leg, Left lower leg, Buttocks Body parts bathed by helper: Back  Bathing assist Assist Level: Supervision or verbal cues      Upper Body Dressing/Undressing Upper body dressing                    Upper body assist Assist Level: Supervision or verbal cues   Set up : To obtain clothing/put away  Lower Body Dressing/Undressing Lower body dressing   What is the patient wearing?: Pants, Non-skid slipper socks     Pants- Performed by patient: Thread/unthread left pants leg, Pull pants up/down, Fasten/unfasten pants, Thread/unthread right pants leg Pants- Performed by helper: Thread/unthread right pants leg Non-skid slipper socks- Performed by patient: Don/doff right sock, Don/doff left sock                    Lower body assist Assist for lower body dressing: Supervision or verbal cues      Toileting Toileting   Toileting steps completed by patient: Adjust clothing prior to toileting, Performs perineal hygiene, Adjust clothing after toileting Toileting steps completed by helper: Adjust clothing prior to toileting, Performs perineal hygiene, Adjust  clothing after toileting (per Lennette Bihari, NT report)    Toileting assist Assist level: Supervision or verbal cues   Transfers Chair/bed transfer   Chair/bed transfer method: Ambulatory Chair/bed transfer assist level: Supervision or verbal cues Chair/bed transfer assistive device: Bedrails     Locomotion Ambulation     Max distance: 150 Assist level: Supervision or verbal cues   Wheelchair   Type: Manual Max wheelchair distance: 50 Assist Level: Moderate  assistance (Pt 50 - 74%)  Cognition Comprehension Comprehension assist level: Understands basic 50 - 74% of the time/ requires cueing 25 - 49% of the time  Expression Expression assist level: Expresses basic 50 - 74% of the time/requires cueing 25 - 49% of the time. Needs to repeat parts of sentences.  Social Interaction Social Interaction assist level: Interacts appropriately 75 - 89% of the time - Needs redirection for appropriate language or to initiate interaction.  Problem Solving Problem solving assist level: Solves basic 25 - 49% of the time - needs direction more than half the time to initiate, plan or complete simple activities  Memory Memory assist level: Recognizes or recalls 25 - 49% of the time/requires cueing 50 - 75% of the time    Medical Problem List and Plan: 1. Debilitation with acute encephalopathy secondary to DKA  Continue CIR 2. DVT Prophylaxis/Anticoagulation: Subcutaneous heparin. Monitor platelet counts of any signs of bleeding 3. Pain Management: Tylenol as needed 4. Diabetes mellitus of peripheral neuropathy. Latest hemoglobin A1c 8.5. Lantus insulin 20 units daily. Check blood sugars before meals and at bedtime. Diabetic teaching  Currently labile  Will continue to monitor, Overall improved control-hyperglycemia yesterday afternoon, but dropping below 90 in the evening  Will continue to monitor  Will consult endocrinology 5. Neuropsych: This patient is capable of making decisions on his own behalf. 6. Skin/Wound Care: Routine skin checks 7. Fluids/Electrolytes/Nutrition: Routine I&O  8. Question seizure. Provoked from hyperglycemia. No seizure medications indicated per neurology services. EEG negative 9. Hypertension.   Norvasc 10 mg daily, Coreg 6.25 mg twice a day.   Chlorthalidone 25 started on 4/24, With improvement  Monitor with increased mobility 10. Chronic renal insufficiency. Baseline creatinine 3. 95.   Creatinine 4.03 on 4/27  Will encourage  fluid intake 11. Sphenoid sinusitis identified on MRI. Diflucan 7 days, to be completed on 4/28. 12. Anemia  Hb  8.8 on 4/27  Will cont to monitor  LOS (Days) 6 A FACE TO FACE EVALUATION WAS PERFORMED  Ankit Lorie [place] 09/14/2015 8:48 AM

## 2015-09-14 NOTE — Progress Notes (Signed)
Occupational Therapy Session Note  Patient Details  Name: Russell Price MRN: [id number] Date of Birth: December 09, 1960  Today's Date: 09/14/2015 OT Individual Time: 0704-0800 OT Individual Time Calculation (min): 56 min    Short Term Goals: Week 1:  OT Short Term Goal 1 (Week 1): Pt will be supervision with grooming OT Short Term Goal 2 (Week 1): Pt will be supervision with bathing OT Short Term Goal 3 (Week 1): Pt will be supervision with bathing OT Short Term Goal 4 (Week 1): Pt will be supervision with dressing OT Short Term Goal 5 (Week 1): Pt will be supervision with toilet transfer  Skilled Therapeutic Interventions/Progress Updates:  Upon entering the room, pt seated on EOB and eating breakfast with no c/o pain this morning. His caregiver, wife, present in room. OT educating pt and caregiver on supervision level goals for discharge home. OT educating caregiver on providing cues and supervision for safety but allowing pt to do things independently. Caregiver needing min cues during session to allow pt to complete task independently. Pt ambulated in room without AD for shower and dressing with caregiver providing supervision and giving cues. OT unsure of amount of cues she is giving secondary to pt and caregiver speaking in another language. Pt completing tasks at supervision level this session. Pt seated on EOB at end of session with call bell and all needed items within reach upon exiting the room.   Therapy Documentation Precautions:  Precautions Precautions: Fall Restrictions Weight Bearing Restrictions: No Vital Signs: Therapy Vitals Temp: 98.8 F (37.1 C) Temp Source: Oral Pulse Rate: 63 Resp: 18 BP: (!) 145/56 mmHg Patient Position (if appropriate): Lying Oxygen Therapy SpO2: 98 % O2 Device: Not Delivered  See Function Navigator for Current Functional Status.   Therapy/Group: Individual Therapy  Phineas Semen 09/14/2015, 8:53 AM

## 2015-09-14 NOTE — Discharge Summary (Signed)
Discharge summary job [id number]

## 2015-09-14 NOTE — Progress Notes (Signed)
Instructed pt on use of both syringe and insulin pen for lantus. Wife correctly demonstrated correct technique of administration for both.

## 2015-09-14 NOTE — Discharge Summary (Signed)
Russell Price, Russell Price NO.:  [id number]  MEDICAL RECORD NO.:  [id number]  LOCATION:  4W10C                        FACILITY:  [facility]  PHYSICIAN:  Delice Lesch, MD        DATE OF BIRTH:  1960/12/18  DATE OF ADMISSION:  09/08/2015 DATE OF DISCHARGE:  09/16/2015                              DISCHARGE SUMMARY   DISCHARGE DIAGNOSES: 1. Acute encephalopathy secondary to diabetic ketoacidosis. 2. Subcutaneous heparin for DVT prophylaxis. 3. Diabetes mellitus, peripheral neuropathy. 4. Pain management. 5. Hypertension. 6. Chronic renal insufficiency with baseline creatinine 3.95. 7. Anemia.  HISTORY OF PRESENT ILLNESS:  This is a 55 year old right-handed male, history of chronic renal insufficiency, uncontrolled diabetes mellitus as well as hypertension.  Independent prior to admission.  Recently laid off from his job.  Presented on September 02, 2015, with increasing fatigue and weakness over the past few days, later found unresponsive by his wife with 2 lacerations on his head.  There was concern for possible seizure.  He was intubated for airway protection.  Troponin 1.22-1.36, felt to be related to demand ischemia.  No Cardiology followup advised. Cranial CT scan negative.  MRI of the brain negative for acute changes. 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  The patient did not receive tPA.  Echocardiogram with ejection fraction of 60%.  No wall motion abnormalities.  EEG with some background slowing.  No seizure.  He was extubated on September 05, 2015. Subcutaneous heparin for DVT prophylaxis.  Physical and occupational therapy ongoing.  The patient was admitted for comprehensive rehab program.  PAST MEDICAL HISTORY:  See discharge diagnoses.  SOCIAL HISTORY:  Lives with his wife, independent prior to admission. Recently laid off from his job.  Functional status upon admission to rehab services:  Max assist, sit to stand; +2  physical assist, supine to sit; mod to max assist for activities of daily living.  PHYSICAL EXAMINATION:  VITAL SIGNS:  Blood pressure 169/71, pulse 85, temperature 98, respirations 18. GENERAL:  This was an alert male, flat affect, made good eye contact with examiner.  Followed basic questions appropriately. LUNGS:  Clear to auscultation without wheeze. CARDIAC:  Regular rhythm without murmur. ABDOMEN:  Soft, nontender.  Good bowel sounds.  REHABILITATION AND HOSPITAL COURSE:  The patient was admitted to inpatient Rehab Services.  Therapies were initiated on a 3-hour daily basis consisting of physical therapy, occupational therapy, speech therapy, and rehabilitation nursing.  The following issues were addressed during the patient's rehabilitation stay.  Pertaining to Mr. Pinch's debilitation acute encephalopathy secondary to DKA.  He continued to participate with his therapies with progressive functional gains.  Subcutaneous heparin for DVT prophylaxis.  Diabetes mellitus, peripheral neuropathy, poor medical compliance.  Hemoglobin A1c of 8.5. He had been placed on Lantus insulin, full diabetic teaching completed, arrangements made for [facility], continue to follow the patient medically as well as attempts for outpatient Endocrinology consultation.  Blood pressures controlled, monitor, no orthostasis, on Norvasc as well as Coreg with chlorthalidone 25 mg daily.  Chronic renal insufficiency, baseline creatinine 3.95, again to be followed at [facility].  The patient received weekly collaborative  interdisciplinary team conferences to discuss estimated length of stay, family teaching, and any barriers to his discharge. Gait training to and from therapy gym with supervision.  He was able to follow obstacle course with supervision.  Required some increased time to complete tasks.  He can ambulate back to his room at the end of his sessions with supervision.   Occupational therapy education with the patient and caregiver on bathing, dressing, and grooming.  He could ambulate without assistive device to the shower and dressing with caregiver providing supervision.  His diet had been advanced to a regular consistency.  Speech Therapy followup.  The patient was requiring some assistance to recognize areas during activities of daily living.  It was advised his wife is needed for 24-hour supervision for safety.  He could communicate his needs without difficulty.  Problem solving, simple task 74% again needing some cues.  Full family teaching was completed and planned the patient was to be discharged to home. Also discussed no driving at time of discharge.  DISCHARGE MEDICATIONS: 1. Norvasc 10 mg p.o. daily. 2. Aspirin 81 mg p.o. daily. 3. Lipitor 40 mg p.o. daily. 4. Coreg 6.25 mg p.o. b.i.d. 5. Chlorthalidone 25 mg p.o. daily. 6. Lantus insulin 25 units subcutaneous daily.  DIET:  Diabetic diet.  The patient would follow up with Dr. Delice Lesch as needed; community [facility]; follow up with endocrinology.  SPECIAL INSTRUCTIONS:  No driving. Ambulatory monitoring of blood glucose with adjustments as needed, endocrinology appointment.     Lauraine Rinne, P.A.   ______________________________ Delice Lesch, MD    DA/MEDQ  D:  09/14/2015  T:  09/14/2015  Job:  [id number]  cc:   Delice Lesch, MD

## 2015-09-14 NOTE — Progress Notes (Signed)
Speech Language Pathology Daily Session Note  Patient Details  Name: Russell Price MRN: [id number] Date of Birth: [birth year]  Today's Date: 09/14/2015 SLP Individual Time: 0900-1000 SLP Individual Time Calculation (min): 60 min  Short Term Goals: Week 1: SLP Short Term Goal 1 (Week 1): Patient will consume current diet without overt s/s of aspiration with Mod I for use of swallow strategies.  SLP Short Term Goal 2 (Week 1): Patient will demonstrate sustained attention to functional tasks for ~10 minutes with Min A verbal cues for redirection.  SLP Short Term Goal 3 (Week 1): Patient will demonstrate functional problem solving for basic tasks with Min A verbal cues. SLP Short Term Goal 4 (Week 1): Patient will identify 2 physical and 2 cognitive impairments with Min A question and verbal cues.  SLP Short Term Goal 5 (Week 1): Patient will recall 2 events from previous therapy sessions with Min A question and verbal cues.   Skilled Therapeutic Interventions: Skilled treatment session focused on cognitive goals. SLP facilitated session by providing total A for recall of his current medications and their functions and overall Mod A verbal, visual and question cues for organization of a 2 time per day pill box. SLP also facilitated session by providing Max A multimodal cues for intellectual awareness of deficits and goals of skilled SLP intervention. Patient left supine in bed with all needs within reach. Continue with current plan of care.    Function:  Eating Eating Eating activity did not occur: N/A Modified Consistency Diet: No Eating Assist Level: No help, No cues           Cognition Comprehension Comprehension assist level: Understands basic 75 - 89% of the time/ requires cueing 10 - 24% of the time  Expression   Expression assist level: Expresses basic 50 - 74% of the time/requires cueing 25 - 49% of the time. Needs to repeat parts of sentences.  Social Interaction Social  Interaction assist level: Interacts appropriately 75 - 89% of the time - Needs redirection for appropriate language or to initiate interaction.  Problem Solving Problem solving assist level: Solves basic 50 - 74% of the time/requires cueing 25 - 49% of the time  Memory Memory assist level: Recognizes or recalls 50 - 74% of the time/requires cueing 25 - 49% of the time    Pain No/Denies Pain  Therapy/Group: Individual Therapy  Russell Price 09/14/2015, 9:07 PM

## 2015-09-15 ENCOUNTER — Inpatient Hospital Stay (HOSPITAL_COMMUNITY): Payer: Medicaid Other | Admitting: Speech Pathology

## 2015-09-15 ENCOUNTER — Inpatient Hospital Stay (HOSPITAL_COMMUNITY): Payer: Self-pay | Admitting: Physical Therapy

## 2015-09-15 ENCOUNTER — Inpatient Hospital Stay (HOSPITAL_COMMUNITY): Payer: Medicaid Other | Admitting: Occupational Therapy

## 2015-09-15 DIAGNOSIS — E875 Hyperkalemia: Secondary | ICD-10-CM | POA: Insufficient documentation

## 2015-09-15 DIAGNOSIS — E118 Type 2 diabetes mellitus with unspecified complications: Secondary | ICD-10-CM

## 2015-09-15 DIAGNOSIS — R0989 Other specified symptoms and signs involving the circulatory and respiratory systems: Secondary | ICD-10-CM | POA: Insufficient documentation

## 2015-09-15 DIAGNOSIS — R030 Elevated blood-pressure reading, without diagnosis of hypertension: Secondary | ICD-10-CM

## 2015-09-15 LAB — BASIC METABOLIC PANEL
ANION GAP: 11 (ref 5–15)
BUN: 51 mg/dL — ABNORMAL HIGH (ref 6–20)
CALCIUM: 8.9 mg/dL (ref 8.9–10.3)
CO2: 20 mmol/L — AB (ref 22–32)
Chloride: 108 mmol/L (ref 101–111)
Creatinine, Ser: 4.1 mg/dL — ABNORMAL HIGH (ref 0.61–1.24)
GFR, EST AFRICAN AMERICAN: 17 mL/min — AB (ref >60)
GFR, EST NON AFRICAN AMERICAN: 15 mL/min — AB (ref >60)
Glucose, Bld: 275 mg/dL — ABNORMAL HIGH (ref 65–99)
Potassium: 5.7 mmol/L — ABNORMAL HIGH (ref 3.5–5.1)
SODIUM: 139 mmol/L (ref 135–145)

## 2015-09-15 LAB — CBC WITH DIFFERENTIAL/PLATELET
BASOS ABS: 0 10*3/uL (ref 0.0–0.1)
Basophils Relative: 0 %
EOS PCT: 2 %
Eosinophils Absolute: 0.1 10*3/uL (ref 0.0–0.7)
HEMATOCRIT: 25.8 % — AB (ref 39.0–52.0)
HEMOGLOBIN: 8.5 g/dL — AB (ref 13.0–17.0)
LYMPHS PCT: 26 %
Lymphs Abs: 1.3 10*3/uL (ref 0.7–4.0)
MCH: 27.1 pg (ref 26.0–34.0)
MCHC: 32.9 g/dL (ref 30.0–36.0)
MCV: 82.2 fL (ref 78.0–100.0)
Monocytes Absolute: 0.4 10*3/uL (ref 0.1–1.0)
Monocytes Relative: 7 %
NEUTROS ABS: 3.4 10*3/uL (ref 1.7–7.7)
NEUTROS PCT: 65 %
PLATELETS: 173 10*3/uL (ref 150–400)
RBC: 3.14 MIL/uL — AB (ref 4.22–5.81)
RDW: 12.9 % (ref 11.5–15.5)
WBC: 5.2 10*3/uL (ref 4.0–10.5)

## 2015-09-15 LAB — GLUCOSE, CAPILLARY
GLUCOSE-CAPILLARY: 227 mg/dL — AB (ref 65–99)
GLUCOSE-CAPILLARY: 76 mg/dL (ref 65–99)
Glucose-Capillary: 235 mg/dL — ABNORMAL HIGH (ref 65–99)
Glucose-Capillary: 180 mg/dL — ABNORMAL HIGH (ref 65–99)

## 2015-09-15 MED ORDER — INSULIN GLARGINE 100 UNIT/ML ~~LOC~~ SOLN
25.0000 [IU] | Freq: Every day | SUBCUTANEOUS | Status: DC
  Administered 2015-09-16: 25 [IU] via SUBCUTANEOUS
  Filled 2015-09-15 (×2): qty 0.25

## 2015-09-15 MED ORDER — INSULIN GLARGINE 100 UNIT/ML ~~LOC~~ SOLN: 25.0000 [IU] | Freq: Every day | SUBCUTANEOUS | Status: DC

## 2015-09-15 MED ORDER — SODIUM POLYSTYRENE SULFONATE 15 GM/60ML PO SUSP
30.0000 g | Freq: Once | ORAL | Status: AC
  Administered 2015-09-15: 30 g via ORAL
  Filled 2015-09-15: qty 120

## 2015-09-15 MED FILL — AMLODIPINE BESYLATE 10 MG T: 10 | 30 days supply | Qty: 30 | Fill #0

## 2015-09-15 MED FILL — !LANTUS 100 UNITS/ML VIAL: 100 | 50 days supply | Qty: 10 | Fill #0

## 2015-09-15 MED FILL — ATORVASTATIN 40 MG TABLET: 40 | 30 days supply | Qty: 30 | Fill #0

## 2015-09-15 MED FILL — CARVEDILOL 6.25 MG TABLET: 6.25 | 30 days supply | Qty: 60 | Fill #0

## 2015-09-15 MED FILL — TRUE METRIX TEST STRIP: 30 days supply | Qty: 100 | Fill #0

## 2015-09-15 MED FILL — !TRUE METRIX BLOOD GLUCOSE: 365 days supply | Qty: 1 | Fill #0

## 2015-09-15 MED FILL — TRUEplus LANCETS 28G MISC: 30 days supply | Qty: 100 | Fill #0

## 2015-09-15 MED FILL — ?CHLORTHALIDONE 25 MG TABLE: 25 | 30 days supply | Qty: 30 | Fill #0

## 2015-09-15 NOTE — Progress Notes (Signed)
[facility] PHYSICAL MEDICINE & REHABILITATION     PROGRESS NOTE  Subjective/Complaints:  Patient sitting in bed this morning. He is comfortable. He is looking for to going home tomorrow.  ROS: Denies CP, SOB, nausea, vomiting, diarrhea.  Objective: Vital Signs: Blood pressure 151/70, pulse 63, temperature 98.2 F (36.8 C), temperature source Oral, resp. rate 16, weight 66.4 kg (146 lb 6.2 oz), SpO2 100 %. No results found.  Recent Labs  09/14/15 0557 09/15/15 0729  WBC 5.6 5.2  HGB 8.8* 8.5*  HCT 26.0* 25.8*  PLT 199 173    Recent Labs  09/14/15 0557 09/15/15 0729  NA 141 139  K 5.2* 5.7*  CL 108 108  GLUCOSE 167* 275*  BUN 49* 51*  CREATININE 4.03* 4.10*  CALCIUM 9.0 8.9   CBG (last 3)   Recent Labs  09/14/15 1612 09/14/15 2054 09/15/15 0638  GLUCAP 205* 219* 235*    Wt Readings from Last 3 Encounters:  09/15/15 66.4 kg (146 lb 6.2 oz)  09/08/15 77.52 kg (170 lb 14.4 oz)  07/24/15 75.841 kg (167 lb 3.2 oz)    Physical Exam:  BP 151/70 mmHg  Pulse 63  Temp(Src) 98.2 F (36.8 C) (Oral)  Resp 16  Wt 66.4 kg (146 lb 6.2 oz)  SpO2 100% Constitutional: He appears well-developed. Well-nourished. NAD. HENT: Normocephalic. Healing abrasions Eyes: EOM are normal.  Cardiovascular: Normal rate and regular rhythm.  Respiratory: Effort normal and breath sounds normal. No respiratory distress.  GI: Soft. Bowel sounds are normal. He exhibits no distension.  Musculoskeletal: No edema. No tenderness. Neurological: He is alert and oriented.  Mood is flat but appropriate.  Follows commands.  Motor: 5/5 throughout Skin: Skin is warm and dry. Facial abrasions healing  Assessment/Plan: 1. Functional deficits secondary to DKA which require 3+ hours per day of interdisciplinary therapy in a comprehensive inpatient rehab setting. Physiatrist is providing close team supervision and 24 hour management of active medical problems listed below. Physiatrist and  rehab team continue to assess barriers to discharge/monitor patient progress toward functional and medical goals.  Function:  Bathing Bathing position   Position: Shower  Bathing parts Body parts bathed by patient: Right arm, Left arm, Chest, Abdomen, Front perineal area, Right upper leg, Left upper leg, Right lower leg, Left lower leg, Buttocks Body parts bathed by helper: Back  Bathing assist Assist Level: Supervision or verbal cues      Upper Body Dressing/Undressing Upper body dressing                    Upper body assist Assist Level: Supervision or verbal cues   Set up : To obtain clothing/put away  Lower Body Dressing/Undressing Lower body dressing   What is the patient wearing?: Pants, Non-skid slipper socks     Pants- Performed by patient: Thread/unthread left pants leg, Pull pants up/down, Fasten/unfasten pants, Thread/unthread right pants leg Pants- Performed by helper: Thread/unthread right pants leg Non-skid slipper socks- Performed by patient: Don/doff right sock, Don/doff left sock                    Lower body assist Assist for lower body dressing: Supervision or verbal cues      Toileting Toileting   Toileting steps completed by patient: Adjust clothing prior to toileting, Performs perineal hygiene, Adjust clothing after toileting Toileting steps completed by helper: Adjust clothing prior to toileting, Performs perineal hygiene, Adjust clothing after toileting (per Lennette Bihari, NT report)    Toileting  assist Assist level: Supervision or verbal cues   Transfers Chair/bed transfer   Chair/bed transfer method: Ambulatory Chair/bed transfer assist level: Supervision or verbal cues Chair/bed transfer assistive device: Bedrails     Locomotion Ambulation     Max distance: 300 Assist level: Supervision or verbal cues   Wheelchair   Type: Manual Max wheelchair distance: 50 Assist Level: Moderate assistance (Pt 50 - 74%)  Cognition Comprehension  Comprehension assist level: Understands basic 50 - 74% of the time/ requires cueing 25 - 49% of the time  Expression Expression assist level: Expresses basic 50 - 74% of the time/requires cueing 25 - 49% of the time. Needs to repeat parts of sentences.  Social Interaction Social Interaction assist level: Interacts appropriately 75 - 89% of the time - Needs redirection for appropriate language or to initiate interaction.  Problem Solving Problem solving assist level: Solves basic 25 - 49% of the time - needs direction more than half the time to initiate, plan or complete simple activities  Memory Memory assist level: Recognizes or recalls 25 - 49% of the time/requires cueing 50 - 75% of the time    Medical Problem List and Plan: 1. Debilitation with acute encephalopathy secondary to DKA  Continue CIR 2. DVT Prophylaxis/Anticoagulation: Subcutaneous heparin. Monitor platelet counts of any signs of bleeding 3. Pain Management: Tylenol as needed 4. Diabetes mellitus of peripheral neuropathy. Latest hemoglobin A1c 8.5.  Check blood sugars before meals and at bedtime. Diabetic teaching  Lantus insulin 20 units daily, increased to 25 units on 4/28  Currently labile, Although trending up  Will continue to monitor, will also need close monitoring in ambulatory setting  Endocrinology consult pending 5. Neuropsych: This patient is capable of making decisions on his own behalf. 6. Skin/Wound Care: Routine skin checks 7. Fluids/Electrolytes/Nutrition: Routine I&O  Hypokalemia: Potassium 5.7 on 4/28, will order Kayexalate    Monitor in ambulatory setting 8. Question seizure. Provoked from hyperglycemia. No seizure medications indicated per neurology services. EEG negative 9. Hypertension.   Norvasc 10 mg daily, Coreg 6.25 mg twice a day.   Chlorthalidone 25 started on 4/24, With improvement  Monitor with increased mobility, will not increase do to labile recordings 10. Chronic renal insufficiency.  Baseline creatinine 3. 95.   Creatinine 4.1, overall relatively well controlled on 4/28  Will encourage fluid intake 11. Sphenoid sinusitis identified on MRI. Diflucan 7 days, to be completed on 4/28. 12. Anemia  Hb  8.5 on 4/28  Will cont to monitor  LOS (Days) 7 A FACE TO FACE EVALUATION WAS PERFORMED  Ankit Lorie Phenix 09/15/2015 10:06 AM

## 2015-09-15 NOTE — Discharge Instructions (Signed)
Inpatient Rehab Discharge Instructions  Russell Price Discharge date and time: No discharge date for patient encounter.   Activities/Precautions/ Functional Status: Activity: activity as tolerated Diet: diabetic diet Wound Care: none needed Functional status:  ___ No restrictions     ___ Walk up steps independently ___ 24/7 supervision/assistance   ___ Walk up steps with assistance ___ Intermittent supervision/assistance  ___ Bathe/dress independently ___ Walk with walker     _x__ Bathe/dress with assistance ___ Walk Independently    ___ Shower independently ___ Walk with assistance    ___ Shower with assistance ___ No alcohol     ___ Return to work/school ________    COMMUNITY REFERRALS UPON DISCHARGE:    Home [facility]:      [facility]                                                         Agency:  [place]      Phone: [phone]        Special Instructions:    My questions have been answered and I understand these instructions. I will adhere to these goals and the provided educational materials after my discharge from the hospital.  Patient/Caregiver Signature _______________________________ Date __________  Clinician Signature _______________________________________ Date __________  Please bring this form and your medication list with you to all your follow-up doctor's appointments.

## 2015-09-15 NOTE — Hospital Discharge Follow-Up (Signed)
Transitional Care Clinic:  Patient anticipated to discharge on 09/16/15 with [facility] Speech therapy with [facility]. Patient has an appointment on 09/21/15 at 77 with Nicoletta Ba, Clinical Pharmacist at [facility] for continued diabetes education and a Transitional Care clinic appointment on 09/21/15 at 4 with Dr. Jarold Song. This Case Manager and Lennart Pall, SW spoke with patient and patient's spouse who indicated family member took patient's prescriptions to [facility] today to be filled. Spoke with Si Raider, Pharmacy tech at [facility] and [place] who indicated patient's family member dropped off prescriptions and planned to pick them up around 1600. No additional needs/concerns identified.

## 2015-09-15 NOTE — Progress Notes (Signed)
Physical Therapy Discharge Summary  Patient Details  Name: Russell Price MRN: [id number] Date of Birth: 1960-06-08  Today's Date: 09/15/2015 PT Individual Time: 0900-1015 PT Individual Time Calculation (min): 75 min    Patient has met 7 of 7 long term goals due to improved balance and improved awareness.  Patient to discharge at an ambulatory level Supervision.   Patient's care partner is independent to provide the necessary cognitive assistance at discharge.  Recommendation:  Not recommending f/u with PT.  PT provided HEP for pt and reviewed with pt and wife who verbalized understanding.  Handout issued to take home.    Equipment: No equipment provided  Reasons for discharge: treatment goals met  Patient/family agrees with progress made and goals achieved: Yes   Skilled Therapeutic Intervention: Pt received resting supine in bed, no c/o pain, and agreeable to therapy.  Session focus on gait, training, dual task, path finding, memory, balance, and strengthening.  Pt performs all mobility with supervision 2/2 cognitive deficits requiring verbal cues for safety, sequencing, and directions.  PT instructed pt in obstacle course x2 including compliant surface, weaving cones with soccer ball, balance beam, and stepping over unstable objects.  Pt performed with close supervision and min>mod verbal cues for sequencing and directions.  Pt instructed pt in gait while manipulating soccer ball from therapy gym to ortho gym up/down ramp with ball and back to therapy gym.  Gait training focus on path finding from dayroom to therapy gym, pt unable to find gym without mod verbal cues from therapist.  Gait training focus on path finding from therapy gym to ortho gym without soccer ball for distraction and pt required min verbal cues.  Gait training to [employer]	[place] focus on path finding back to therapy gym and pt able to do so without verbal cues.  PT issued pt OTAGO level C HEP and instructed pt in each  exercises providing verbal/tactile cues as necessary for correct form.  PT also provided education to wife on HEP and she verbalized understanding.  Pt returned to room at end of session with supervision and positioned self in bed with call bell in reach and needs met.   PT Discharge Precautions/Restrictions Precautions Precautions: Fall Pain Pain Assessment Pain Assessment: No/denies pain  Cognition Overall Cognitive Status: Impaired/Different from baseline Arousal/Alertness: Awake/alert Orientation Level: Oriented X4 Attention: Sustained;Alternating Focused Attention: Appears intact Sustained Attention: Appears intact Alternating Attention: Impaired Alternating Attention Impairment: Verbal basic;Functional basic Problem Solving: Impaired Initiating: Appears intact Safety/Judgment: Impaired Sensation Sensation Light Touch: Appears Intact Coordination Gross Motor Movements are Fluid and Coordinated: Yes Fine Motor Movements are Fluid and Coordinated: Yes Motor  Motor Motor: Within Functional Limits  Mobility Bed Mobility Bed Mobility: Sit to Supine;Supine to Sit Supine to Sit: 5: Supervision Sit to Supine: 5: Supervision Transfers Transfers: Yes Sit to Stand: 5: Supervision Stand to Sit: 5: Supervision Stand Pivot Transfers: 5: Supervision Squat Pivot Transfers: 5: Supervision Locomotion  Ambulation Ambulation: Yes Ambulation/Gait Assistance: 5: Supervision Ambulation Distance (Feet): 1000 Feet Assistive device: None Ambulation/Gait Assistance Details: cues for LLE heel strike and decreased circumduction and path finding High Level Ambulation High Level Ambulation: Side stepping;Backwards walking Side Stepping: supervision Backwards Walking: supervision Stairs / Additional Locomotion Stairs: Yes Stairs Assistance: 5: Supervision Stair Management Technique: Two rails Number of Stairs: 12 Height of Stairs: 6 Ramp: 5: Supervision Curb: 5:  Supervision Wheelchair Mobility Wheelchair Mobility: No  Trunk/Postural Assessment  Cervical Assessment Cervical Assessment: Within Functional Limits Thoracic Assessment Thoracic Assessment: Within Functional Limits  Lumbar Assessment Lumbar Assessment: Within Functional Limits Postural Control Postural Control: Within Functional Limits  Balance Balance Balance Assessed: Yes Standardized Balance Assessment Standardized Balance Assessment: Dynamic Gait Index Static Sitting Balance Static Sitting - Level of Assistance: 6: Modified independent (Device/Increase time) Dynamic Sitting Balance Dynamic Sitting - Level of Assistance: 5: Stand by assistance Static Standing Balance Static Standing - Level of Assistance: 5: Stand by assistance Dynamic Standing Balance Dynamic Standing - Level of Assistance: 5: Stand by assistance Extremity Assessment      RLE AROM (degrees) Overall AROM Right Lower Extremity: Within functional limits for tasks assessed RLE Strength RLE Overall Strength: Within Functional Limits for tasks assessed Right Hip Flexion: 4+/5 Right Knee Flexion: 4/5 Right Knee Extension: 5/5 Right Ankle Dorsiflexion: 4+/5 Right Ankle Plantar Flexion: 4+/5 LLE Assessment LLE Assessment: Within Functional Limits LLE AROM (degrees) Overall AROM Left Lower Extremity: Within functional limits for tasks assessed LLE Strength LLE Overall Strength: Within Functional Limits for tasks assessed Left Hip Flexion: 5/5 Left Knee Flexion: 5/5 Left Knee Extension: 5/5 Left Ankle Dorsiflexion: 4+/5 Left Ankle Plantar Flexion: 4/5   See Function Navigator for Current Functional Status.  Lusia Greis E Penven-Crew 09/15/2015, 10:06 AM

## 2015-09-15 NOTE — Progress Notes (Signed)
Speech Language Pathology Session Note & Discharge Summary  Patient Details  Name: Russell Price MRN: [id number] Date of Birth: 1960/08/07  Today's Date: 09/15/2015 SLP Individual Time: 1100-1155 SLP Individual Time Calculation (min): 55 min   Skilled Therapeutic Interventions:  Skilled treatment session focused on cognitive goals and completion of family education. SLP facilitated session by providing Max A multimodal cues for functional problem solving with basic money management task and Total A for emergent awareness in regards to difficulty of task and level of assistance needed. Patient recalled events from previous therapy session with extra time and supervision question cues. Educated the patient and his wife in regards to his current cognitive deficits and strategies to utilize at home to maximize recall of new information, problem solving and overall safety at home. Both verbalized understanding of all information and handouts were also given to reinforce information. Patient left sitting EOB with family present.   Patient has met 2 of 5 long term goals.  Patient to discharge at overall Mod;Max level.   Reasons goals not met: Patient continues to require overall Mod-Max A for recall of information, functional problem solving and intellectual awareness of deficits.    Clinical Impression/Discharge Summary: Patient has made minimal gains and has met 2 of 5 LTG's this admission due to a shorter length of stay than previously planned due to quick gains in mobility. Currently, patient is consuming regular textures with thin liquids without overt s/s of aspiration and is Mod I for use of swallowing compensatory strategies. Patient also requires overall Mod-Max A multimodal cues safely in regards to attention, recall, problem solving and awareness. Patient and family education is complete and patient will discharge home with 24 hour supervision from family. Patient would benefit from f/u SLP  services to maximize cognitive function and overall functional independence in order to reduce caregiver burden.   Care Partner:  Caregiver Able to Provide Assistance: Yes  Type of Caregiver Assistance: Cognitive  Recommendation:  Home Health SLP;24 hour supervision/assistance  Rationale for SLP Follow Up: Maximize cognitive function and independence;Reduce caregiver burden   Equipment: N/A   Reasons for discharge: Discharged from hospital   Patient/Family Agrees with Progress Made and Goals Achieved: Yes   Function:  Eating Eating Eating activity did not occur: N/A Modified Consistency Diet: No Eating Assist Level: No help, No cues           Cognition Comprehension Comprehension assist level: Understands basic 50 - 74% of the time/ requires cueing 25 - 49% of the time  Expression   Expression assist level: Expresses basic 50 - 74% of the time/requires cueing 25 - 49% of the time. Needs to repeat parts of sentences.  Social Interaction Social Interaction assist level: Interacts appropriately 75 - 89% of the time - Needs redirection for appropriate language or to initiate interaction.  Problem Solving Problem solving assist level: Solves basic 50 - 74% of the time/requires cueing 25 - 49% of the time  Memory Memory assist level: Recognizes or recalls 50 - 74% of the time/requires cueing 25 - 49% of the time   Guage [place] 09/15/2015, 5:44 PM

## 2015-09-15 NOTE — Patient Care Conference (Signed)
Inpatient RehabilitationTeam Conference and Plan of Care Update Date: 09/13/2015   Time: 2:40 PM    Patient Name: Russell Price      Medical Record Number: [id number]  Date of Birth: [birth year] Sex: Male         Room/Bed: 4W10C/4W10C-01 Payor Info: Payor: /    Admitting Diagnosis: DECONDITIONING WITH ENCEPHALOPATHY  Admit Date/Time:  09/08/2015  3:05 PM Admission Comments: No comment available   Primary Diagnosis:  Debility Principal Problem: Debility  Patient Active Problem List   Diagnosis Date Noted  . Labile blood pressure   . Hyperkalemia   . Labile blood glucose   . Diabetes mellitus type 2 in nonobese (HCC)   . Hypoglycemia associated with type 2 diabetes mellitus ([place])   . Chronic kidney disease (CKD), stage IV (severe) ([place])   . Absolute anemia   . Acute sphenoidal sinusitis   . Debility 09/08/2015  . Encephalopathy 09/08/2015  . Acidemia   . Acute on chronic kidney failure ([place])   . Acute respiratory failure with hypercapnia ([place])   . Diabetic ketoacidosis with coma associated with type 2 diabetes mellitus ([place])   . Seizure-like activity ([place])   . DKA (diabetic ketoacidoses) ([place]) 09/02/2015  . NSTEMI (non-ST elevated myocardial infarction) ([place]) 02/22/2015  . Abnormal nuclear cardiac imaging test 02/22/2015  . LV dysfunction   . Acute kidney injury ([place])   . Cerebral thrombosis with cerebral infarction ([place]) 02/18/2015  . Acute respiratory failure with hypoxemia ([place]) 02/14/2015  . ARDS (adult respiratory distress syndrome) ([place]) 02/14/2015  . Hemoptysis 02/14/2015  . Diffuse pulmonary alveolar hemorrhage   . Essential hypertension 11/11/2014  . DM (diabetes mellitus) with complications ([place]) 27/25/3664  . CKD (chronic kidney disease) stage 4, GFR 15-29 ml/min (HCC) 11/11/2014  . Blind right eye 11/11/2014  . Noncompliance 11/11/2014    Expected Discharge Date: Expected Discharge Date: 09/16/15  Team Members Present: Physician leading conference: Dr.  Alger Simons Social Worker Present: Lennart Pall, LCSW Nurse Present: Rayetta Pigg, RN PT Present: Dwyane Dee, PT OT Present: Benay Pillow, OT SLP Present: Weston Anna, SLP PPS Coordinator present : Daiva Nakayama, RN, CRRN     Current Status/Progress Goal Weekly Team Focus  Medical   Debilitation with acute encephalopathysecondary to DKA  Improve BO, DM, renal function  see above   Bowel/Bladder   Cont x2; LBM 4/22  Patient remain cont x2 with Min assist  Encourage regular bowel movements   Swallow/Nutrition/ Hydration   regular and thin Mod I   least restrictve PO Mod I   goals met   ADL's   supervision for toileting, toilet transfer, UB self care with set up A, LB dressing with steady assist, shower with steady assist  goals downgraded to supervision overall secondary to cognition  pt/family education, safety awareness, self care, functional transfers/mobility   Mobility   superivision for all mobilty  supervision for all mobility, downgraded 2/2 cognitive deficits  family education for providing appropriate supervision/cues, d/c Saturday   Communication             Safety/Cognition/ Behavioral Observations  Mod assist   Supervision   increase recall, use of strategies and cuing strategies for wife   Pain   No complaints of pain  < 3 on 0-10 pain scale  Continue monitioring patient pain qshift and with therapy   Skin   Healing abrasions to face  No new skin breakdown/infection while on Rehab.   Continue monitoring skin qshift.  Rehab Goals Patient on target to meet rehab goals: Yes *See Care Plan and progress notes for long and short-term goals.  Barriers to Discharge: HTN, DM, CKD, ABLA, pt and safety awareness    Possible Resolutions to Barriers:  Optimize BP meds, DM meds, follow labs, cont therapies    Discharge Planning/Teaching Needs:  Home with wife who can provide 24/7 assistance.  Teaching has been ongoing.   Team Discussion:  Tx plannig  to downgrade all goals to supervision due to cognitive deficts.  Today pt was actually incont of bowel and unaware.  Wife feels pt is cognitively much improved - education to continue.  Revisions to Treatment Plan:  Goals downgraded overall to supervision   Continued Need for Acute Rehabilitation Level of Care: The patient requires daily medical management by a physician with specialized training in physical medicine and rehabilitation for the following conditions: Daily direction of a multidisciplinary physical rehabilitation program to ensure safe treatment while eliciting the highest outcome that is of practical value to the patient.: Yes Daily medical management of patient stability for increased activity during participation in an intensive rehabilitation regime.: Yes Daily analysis of laboratory values and/or radiology reports with any subsequent need for medication adjustment of medical intervention for : Blood pressure problems;Diabetes problems;Renal problems  Sparrow [place] 09/15/2015, 12:23 PM

## 2015-09-15 NOTE — Progress Notes (Signed)
Occupational Therapy Discharge Summary  Patient Details  Name: Russell Price MRN: [id number] Date of Birth: Dec 24, 1960  Today's Date: 09/15/2015 OT Individual Time:  -   [id number]  (75 min)    OT addressed basic functional transfers, bed mobility, standing balance, attention, attention, therapeutic activities in functional task. Pt showered with wife assisting for safety.  Pt standing on outside edge of shower and running water out in bathroom.  Provided verbal cues for him to get inside shower.  Pt sat on edge of shower bench.  Prompted him to scoot towards the wall.  Pt needed close supervision with standing balance when donning pants in middle of room.  Provided cues for him to dress in a safet environment where he could sit down if he lost his balance.  Pt verbalized understanding as well as wife.      Patient has met 13 of 13 long term goals due to improved activity tolerance, improved balance, postural control, ability to compensate for deficits, improved attention, improved awareness and improved coordination.  Patient to discharge at overall Supervision level.  Patient's care partner requires assistance to provide the necessary cognitive assistance at discharge.    Reasons goals not met: all goals met  Recommendation:  Patient will benefit from ongoing skilled OT services in home health setting to continue to advance functional skills in the area of iADL.  Equipment: No equipment provided  Reasons for discharge: treatment goals met and discharge from hospital  Patient/family agrees with progress made and goals achieved: Yes  OT Discharge Precautions/Restrictions  Precautions Precautions: Fall Restrictions Weight Bearing Restrictions: No Vital Signs Therapy Vitals Temp: 98.2 F (36.8 C) Temp Source: Oral Pulse Rate: (!) 55 Resp: 18 BP: (!) 131/57 mmHg Patient Position (if appropriate): Sitting Oxygen Therapy SpO2: 100 % O2 Device: Not Delivered Pain:  3/10    Vision/Perception  Vision- Assessment Eye Alignment: Impaired (comment) Perception Perception: Within Functional Limits  Cognition Overall Cognitive Status: Impaired/Different from baseline Arousal/Alertness: Awake/alert Orientation Level: Oriented to person;Oriented to place;Oriented to time;Oriented to situation;Oriented X4 Attention: Sustained;Selective Focused Attention: Appears intact Sustained Attention: Appears intact Sustained Attention Impairment: Verbal basic;Verbal complex Selective Attention: Impaired Alternating Attention Impairment: Verbal basic;Functional basic Memory: Impaired Memory Impairment: Storage deficit;Decreased recall of new information Awareness: Impaired Awareness Impairment: Intellectual impairment Problem Solving: Impaired Problem Solving Impairment: Verbal basic;Functional basic Executive Function: Initiating Initiating: Appears intact Safety/Judgment: Impaired Comments: decreased processing (?language) Sensation Sensation Light Touch: Appears Intact Coordination Gross Motor Movements are Fluid and Coordinated: Yes Fine Motor Movements are Fluid and Coordinated: Yes Finger Nose Finger Test: good 26xx r and 19 x left.x20 sec BUE Motor  Motor Motor: Within Functional Limits Mobility  Bed Mobility Bed Mobility: Sit to Supine;Supine to Sit Rolling Right: 7: Independent Rolling Left: 5: Supervision Supine to Sit: 7: Independent Sit to Supine: 7: Independent Transfers Sit to Stand: 5: Supervision Stand to Sit: 5: Supervision  Trunk/Postural Assessment  Cervical Assessment Cervical Assessment: Within Functional Limits Thoracic Assessment Thoracic Assessment: Within Functional Limits Thoracic Strength Overall Thoracic Strength: Within functional limits for tasks performed Lumbar Assessment Lumbar Assessment: Within Functional Limits Postural Control Postural Control: Within Functional Limits  Balance Balance Balance Assessed:  Yes Static Sitting Balance Static Sitting - Level of Assistance: 6: Modified independent (Device/Increase time) Dynamic Sitting Balance Dynamic Sitting - Level of Assistance: 5: Stand by assistance Static Standing Balance Static Standing - Level of Assistance: 5: Stand by assistance Dynamic Standing Balance Dynamic Standing - Level of Assistance: 5: Stand by assistance  Extremity/Trunk Assessment RUE Assessment RUE Assessment: Within Functional Limits LUE Assessment LUE Assessment: Within Functional Limits   See Function Navigator for Current Functional Status.  Lisa Roca 09/15/2015, 2:15 PM

## 2015-09-15 NOTE — Progress Notes (Addendum)
Social Work  Discharge Note  The overall goal for the admission was met for:   Discharge location: Yes - home with wife who can provide 24/7 supervision  Length of Stay: Yes - 8 days (with d/c 4/29)  Discharge activity level: Yes - supervision  Home/community participation: Yes  Services provided included: MD, RD, PT, OT, SLP, RN, TR, Pharmacy and SW  Financial Services: None  Follow-up services arranged: Home Health: [facility] via [facility], Other: Pt set up with the Transitional Care program via [facility].  Wife able to get d/c medications and DM supplies via this center today with pt to d/c tomorrow.  They will continue with [facility] program for primary care moving forward. and Patient/Family has no preference for HH/DME agencies  Comments (or additional information): Provided pt/wife with handout on various community support resources which include the "[employer]" and "[employer] [place]" books on food resources.  Patient/Family verbalized understanding of follow-up arrangements: Yes  Individual responsible for coordination of the follow-up plan: pt/wife  Confirmed correct DME delivered: NA - no DME needs    Russell Price, Russell Price 

## 2015-09-16 LAB — BASIC METABOLIC PANEL
ANION GAP: 10 (ref 5–15)
BUN: 48 mg/dL — AB (ref 6–20)
CHLORIDE: 108 mmol/L (ref 101–111)
CO2: 23 mmol/L (ref 22–32)
Calcium: 8.9 mg/dL (ref 8.9–10.3)
Creatinine, Ser: 3.98 mg/dL — ABNORMAL HIGH (ref 0.61–1.24)
GFR calc Af Amer: 18 mL/min — ABNORMAL LOW (ref >60)
GFR, EST NON AFRICAN AMERICAN: 15 mL/min — AB (ref >60)
GLUCOSE: 204 mg/dL — AB (ref 65–99)
POTASSIUM: 4.5 mmol/L (ref 3.5–5.1)
SODIUM: 141 mmol/L (ref 135–145)

## 2015-09-16 LAB — GLUCOSE, CAPILLARY: GLUCOSE-CAPILLARY: 198 mg/dL — AB (ref 65–99)

## 2015-09-16 NOTE — Progress Notes (Signed)
[facility] PHYSICAL MEDICINE & REHABILITATION     PROGRESS NOTE  Subjective/Complaints:  Looking forward to discharge. Wife needs a letter Indicating she was here in the hospital with the patient  ROS: Denies CP, SOB, nausea, vomiting, diarrhea.  Objective: Vital Signs: Blood pressure 152/59, pulse 61, temperature 98.5 F (36.9 C), temperature source Oral, resp. rate 20, weight 63.6 kg (140 lb 3.4 oz), SpO2 100 %. No results found.  Recent Labs  09/14/15 0557 09/15/15 0729  WBC 5.6 5.2  HGB 8.8* 8.5*  HCT 26.0* 25.8*  PLT 199 173    Recent Labs  09/15/15 0729 09/16/15 0642  NA 139 141  K 5.7* 4.5  CL 108 108  GLUCOSE 275* 204*  BUN 51* 48*  CREATININE 4.10* 3.98*  CALCIUM 8.9 8.9   CBG (last 3)   Recent Labs  09/15/15 1706 09/15/15 2059 09/16/15 0646  GLUCAP 76 180* 198*    Wt Readings from Last 3 Encounters:  09/16/15 63.6 kg (140 lb 3.4 oz)  09/08/15 77.52 kg (170 lb 14.4 oz)  07/24/15 75.841 kg (167 lb 3.2 oz)    Physical Exam:  BP 152/59 mmHg  Pulse 61  Temp(Src) 98.5 F (36.9 C) (Oral)  Resp 20  Wt 63.6 kg (140 lb 3.4 oz)  SpO2 100% Constitutional: He appears well-developed. Well-nourished. NAD. HENT: Normocephalic. Healing abrasions Eyes: EOM are normal.  Cardiovascular: Normal rate and regular rhythm.  Respiratory: Effort normal and breath sounds normal. No respiratory distress.  GI: Soft. Bowel sounds are normal. He exhibits no distension.  Musculoskeletal: No edema. No tenderness. Neurological: He is alert and oriented.  Mood is flat but appropriate.  Follows commands.  Motor: 5/5 throughout Skin: Skin is warm and dry. Facial abrasions healing  Assessment/Plan: 1. Functional deficits secondary to DKA  Stable for D/C today F/u PCP in 3-4 weeks  See D/C summary See D/C instructions Function:  Bathing   Bathing position   Position: Shower  Bathing parts Body parts bathed by patient: Right arm, Left arm, Chest, Abdomen,  Front perineal area, Right upper leg, Left upper leg, Right lower leg, Left lower leg, Buttocks Body parts bathed by helper: Back  Bathing assist Assist Level: Supervision or verbal cues      Upper Body Dressing/Undressing Upper body dressing   What is the patient wearing?: Pull over shirt/dress     Pull over shirt/dress - Perfomed by patient: Thread/unthread right sleeve, Thread/unthread left sleeve, Put head through opening, Pull shirt over trunk          Upper body assist Assist Level: Supervision or verbal cues   Set up : To obtain clothing/put away  Lower Body Dressing/Undressing Lower body dressing   What is the patient wearing?: Pants, Non-skid slipper socks, Shoes     Pants- Performed by patient: Thread/unthread left pants leg, Pull pants up/down, Fasten/unfasten pants, Thread/unthread right pants leg Pants- Performed by helper: Thread/unthread right pants leg Non-skid slipper socks- Performed by patient: Don/doff right sock, Don/doff left sock       Shoes - Performed by patient: Don/doff right shoe, Don/doff left shoe            Lower body assist Assist for lower body dressing: Supervision or verbal cues      Toileting Toileting   Toileting steps completed by patient: Adjust clothing prior to toileting, Performs perineal hygiene, Adjust clothing after toileting Toileting steps completed by helper: Adjust clothing prior to toileting, Performs perineal hygiene, Adjust clothing after toileting  Toileting assist Assist level: Supervision or verbal cues   Transfers Chair/bed transfer   Chair/bed transfer method: Ambulatory Chair/bed transfer assist level: Supervision or verbal cues Chair/bed transfer assistive device: Bedrails     Locomotion Ambulation     Max distance: 1000 Assist level: Supervision or verbal cues   Wheelchair   Type: Manual Max wheelchair distance: 50 Assist Level: Moderate assistance (Pt 50 - 74%)  Cognition Comprehension  Comprehension assist level: Understands basic 50 - 74% of the time/ requires cueing 25 - 49% of the time  Expression Expression assist level: Expresses basic 50 - 74% of the time/requires cueing 25 - 49% of the time. Needs to repeat parts of sentences.  Social Interaction Social Interaction assist level: Interacts appropriately 75 - 89% of the time - Needs redirection for appropriate language or to initiate interaction.  Problem Solving Problem solving assist level: Solves basic 50 - 74% of the time/requires cueing 25 - 49% of the time  Memory Memory assist level: Recognizes or recalls 50 - 74% of the time/requires cueing 25 - 49% of the time    Medical Problem List and Plan: 1. Debilitation with acute encephalopathy secondary to DKA  Discharge home today 2. DVT Prophylaxis/Anticoagulation: Subcutaneous heparin. Monitor platelet counts of any signs of bleeding 3. Pain Management: Tylenol as needed 4. Diabetes mellitus of peripheral neuropathy. Latest hemoglobin A1c 8.5.  Check blood sugars before meals and at bedtime. Diabetic teaching  Lantus insulin 20 units daily, increased to 25 units on 4/28, May receive 1 dose from the pharmacy  Currently labile, Although trending up  Will continue to monitor, will also need close monitoring in ambulatory setting  Endocrinology consult pending 5. Neuropsych: This patient is capable of making decisions on his own behalf. 6. Skin/Wound Care: Routine skin checks 7. Fluids/Electrolytes/Nutrition: Routine I&O  Hypokalemia: Potassium 5.7 on 4/28, will order Kayexalate    Monitor in ambulatory setting 8. Question seizure. Provoked from hyperglycemia. No seizure medications indicated per neurology services. EEG negative 9. Hypertension.   Norvasc 10 mg daily, Coreg 6.25 mg twice a day.   Chlorthalidone 25 started on 4/24, With improvement  Monitor with increased mobility, will not increase do to labile recordings 10. Chronic renal insufficiency. Baseline  creatinine 3. 95.   Creatinine 4.1, overall relatively well controlled on 4/28  Will encourage fluid intake 11. Sphenoid sinusitis identified on MRI. Diflucan 7 days, to be completed on 4/28. 12. Anemia  Hb  8.5 on 4/28  Will cont to monitor  LOS (Days) 8 A FACE TO FACE EVALUATION WAS PERFORMED  Alysia Penna E 09/16/2015 10:50 AM

## 2015-09-16 NOTE — Progress Notes (Signed)
Patient discharged home with wife.  Patient escorted by [place], NT to friend's vehicle.  Patient has discharge paperwork and prescriptions; all questions answered.  Patient and wife verbalize understanding of how to properly use insulin pens at home.

## 2015-09-18 ENCOUNTER — Telehealth: Payer: Self-pay

## 2015-09-18 NOTE — Telephone Encounter (Signed)
[facility] Post-discharge Follow-Up Phone Call:  Attempt #1  Date of Discharge:09/16/2015 Principal Discharge Diagnosis(es): acute encephalopathy secondary to diabetic ketoacidosis, DM, chronic renal insufficiency, anemia Post-discharge Communication: Call placed to # [phone] and a HIPAA compliant voice mail message was left requesting a call back to # [phone] or [phone]. A call was also placed to # [phone] and the voice mailbox was full.  Call Completed: No

## 2015-09-19 ENCOUNTER — Telehealth: Payer: Self-pay

## 2015-09-19 NOTE — Telephone Encounter (Signed)
Transitional [facility] Clinic Post-discharge Follow-Up Phone Call:  Date of Discharge:  09/16/2015 Principal Discharge Diagnosis(es):  Acute encephalopathy secondary to diabetic ketoacidosis Post-discharge Communication: (Clearly document all attempts clearly and date contact made) Call placed to # [phone] and a HIPAA compliant voice mail message was left requesting a call back to # [phone] or [phone]. Call also placed to # [phone] and the voice mailbox was full, unable to leave a message.  Call Completed: No

## 2015-09-20 ENCOUNTER — Telehealth: Payer: Self-pay

## 2015-09-20 NOTE — Telephone Encounter (Signed)
[facility] Post-discharge Follow-Up Phone Call:  Date of Discharge: 09/16/2015 Principal Discharge Diagnosis(es):acute encephalopathy secondary to diabetic ketoacidosis, DM, peripheral neuropathy, chronic renal insufficiency Post-discharge Communication: Call placed to the patient  Call Completed: Yes                    With Whom: the patient and then he turned the phone over to his wife, Russell Price, to complete the call.  Interpreter Needed: No               Language/Dialect:  The patient's wife stated that she also speaks Arabic. This CM offered to contact an Arabic interpreter but Russell Price said that she wanted [place] and did not need an interpreter. .      Please check all that apply:  ? Patient is knowledgeable of his/her condition(s) and/or treatment. ? Patient is caring for self at home.  X  Patient is receiving assist at home from family and/or caregiver. Family and/or caregiver is knowledgeable of patient's condition(s) and/or treatment.  This CM spoke to the patient and then to his wife. She said that she has not been working and was staying with him at the hospital and is now with him all of the time at home. She stated that she helps him when necessary. X  Patient is receiving home health services. If so, name of agency. - [facility]) - Russell Price stated that her husband has not received a call from [facility] yet.  This CM then called [facility] # [phone]  and spoke to Russell Price in the home health department. She confirmed that they received a referral for SLP and SW and the speech therapist may be seeing the patient tomorrow.      Medication Reconciliation:  ? Medication list reviewed with patient. X  Patient obtained all discharge medications. If not, why? -  As per Russell Price the patient has all of his medications and the glucometer.  Russell Price said that she checks his blood sugar every day. This morning his blood sugar was 104. Due to the language barrier it was difficult to communicate via  phone and Russell Price did not want an interpreter.  Russell Price stated that she has been giving him lantus 25 units daily.  She said that she has not been keeping a log of the blood sugars and noted that the meter keeps the record. Again it was difficult to discuss other blood sugar results and review the medications.  Instructed her to bring all of his medications and the glucometer to the appointment tomorrow and she said that she would. It would be easier to reconcile the medications in person with the patient and his wife and they will be meeting with the Pharmacist.   Activities of Daily Living:  ? Independent X  Needs assist (describe; ? home DME used) Russell Price stated that she assists the patient when needed and noted that he is not using an assistive device at this time.  ? [facility] (describe, ? home DME used)   Community resources in place for patient:  ? None  X  Home Health/Home DME - [facility]. ? Assisted Living ? Support Group          Patient Education: Briefly explained the financial counseling services that are available at [facility]. Russell Price stated that she has started the disability application for her husband. She is interested in financial assistance for him now that he is not working. She is also concerned that she is not able to  work right now. They will benefit from an appointment with the [facility] financial counselor.        Questions/Concerns discussed: No other problems/questions reported.  Confirmed the patient's appointments for tomorrow , 09/21/15  @ 1100 with Russell Price [facility] and @ 1130 with Dr Russell Price.  Russell Price said that she would drive him to the clinic.

## 2015-09-21 ENCOUNTER — Ambulatory Visit: Payer: Medicaid Other | Admitting: Pharmacist

## 2015-09-21 ENCOUNTER — Ambulatory Visit: Payer: Medicaid Other | Attending: Family Medicine | Admitting: Family Medicine

## 2015-09-21 ENCOUNTER — Encounter: Payer: Self-pay | Admitting: Family Medicine

## 2015-09-21 VITALS — BP 134/65 | HR 58 | Temp 98.4°F | Resp 18 | Ht 70.0 in | Wt 161.8 lb

## 2015-09-21 DIAGNOSIS — Z7982 Long term (current) use of aspirin: Secondary | ICD-10-CM | POA: Insufficient documentation

## 2015-09-21 DIAGNOSIS — N184 Chronic kidney disease, stage 4 (severe): Secondary | ICD-10-CM | POA: Diagnosis not present

## 2015-09-21 DIAGNOSIS — E1310 Other specified diabetes mellitus with ketoacidosis without coma: Secondary | ICD-10-CM | POA: Diagnosis not present

## 2015-09-21 DIAGNOSIS — Z794 Long term (current) use of insulin: Secondary | ICD-10-CM | POA: Insufficient documentation

## 2015-09-21 DIAGNOSIS — H5441 Blindness, right eye, normal vision left eye: Secondary | ICD-10-CM | POA: Diagnosis not present

## 2015-09-21 DIAGNOSIS — G9340 Encephalopathy, unspecified: Secondary | ICD-10-CM

## 2015-09-21 DIAGNOSIS — I10 Essential (primary) hypertension: Secondary | ICD-10-CM

## 2015-09-21 DIAGNOSIS — Z0000 Encounter for general adult medical examination without abnormal findings: Secondary | ICD-10-CM

## 2015-09-21 DIAGNOSIS — E785 Hyperlipidemia, unspecified: Secondary | ICD-10-CM | POA: Diagnosis not present

## 2015-09-21 DIAGNOSIS — H5440 Blindness, one eye, unspecified eye: Secondary | ICD-10-CM

## 2015-09-21 LAB — COMPLETE METABOLIC PANEL WITH GFR
ALBUMIN: 3.6 g/dL (ref 3.6–5.1)
ALK PHOS: 87 U/L (ref 40–115)
ALT: 14 U/L (ref 9–46)
AST: 10 U/L (ref 10–35)
BILIRUBIN TOTAL: 0.3 mg/dL (ref 0.2–1.2)
BUN: 63 mg/dL — ABNORMAL HIGH (ref 7–25)
CALCIUM: 8.7 mg/dL (ref 8.6–10.3)
CO2: 22 mmol/L (ref 20–31)
CREATININE: 3.82 mg/dL — AB (ref 0.70–1.33)
Chloride: 98 mmol/L (ref 98–110)
GFR, EST AFRICAN AMERICAN: 19 mL/min — AB (ref >=60)
GFR, EST NON AFRICAN AMERICAN: 16 mL/min — AB (ref >=60)
Glucose, Bld: 326 mg/dL — ABNORMAL HIGH (ref 65–99)
Potassium: 5.1 mmol/L (ref 3.5–5.3)
Sodium: 132 mmol/L — ABNORMAL LOW (ref 135–146)
TOTAL PROTEIN: 6.6 g/dL (ref 6.1–8.1)

## 2015-09-21 LAB — GLUCOSE, POCT (MANUAL RESULT ENTRY): POC Glucose: 245 mg/dl — AB (ref 70–99)

## 2015-09-21 NOTE — Patient Instructions (Signed)
Diabetes Mellitus and Food It is important for you to manage your blood sugar (glucose) level. Your blood glucose level can be greatly affected by what you eat. Eating healthier foods in the appropriate amounts throughout the day at about the same time each day will help you control your blood glucose level. It can also help slow or prevent worsening of your diabetes mellitus. Healthy eating may even help you improve the level of your blood pressure and reach or maintain a healthy weight.  General recommendations for healthful eating and cooking habits include:  Eating meals and snacks regularly. Avoid going long periods of time without eating to lose weight.  Eating a diet that consists mainly of plant-based foods, such as fruits, vegetables, nuts, legumes, and whole grains.  Using low-heat cooking methods, such as baking, instead of high-heat cooking methods, such as deep frying. Work with your dietitian to make sure you understand how to use the Nutrition Facts information on food labels. HOW CAN FOOD AFFECT ME? Carbohydrates Carbohydrates affect your blood glucose level more than any other type of food. Your dietitian will help you determine how many carbohydrates to eat at each meal and teach you how to count carbohydrates. Counting carbohydrates is important to keep your blood glucose at a healthy level, especially if you are using insulin or taking certain medicines for diabetes mellitus. Alcohol Alcohol can cause sudden decreases in blood glucose (hypoglycemia), especially if you use insulin or take certain medicines for diabetes mellitus. Hypoglycemia can be a life-threatening condition. Symptoms of hypoglycemia (sleepiness, dizziness, and disorientation) are similar to symptoms of having too much alcohol.  If your health care provider has given you approval to drink alcohol, do so in moderation and use the following guidelines:  Women should not have more than one drink per day, and men  should not have more than two drinks per day. One drink is equal to:  12 oz of beer.  5 oz of wine.  1 oz of hard liquor.  Do not drink on an empty stomach.  Keep yourself hydrated. Have water, diet soda, or unsweetened iced tea.  Regular soda, juice, and other mixers might contain a lot of carbohydrates and should be counted. WHAT FOODS ARE NOT RECOMMENDED? As you make food choices, it is important to remember that all foods are not the same. Some foods have fewer nutrients per serving than other foods, even though they might have the same number of calories or carbohydrates. It is difficult to get your body what it needs when you eat foods with fewer nutrients. Examples of foods that you should avoid that are high in calories and carbohydrates but low in nutrients include:  Trans fats (most processed foods list trans fats on the Nutrition Facts label).  Regular soda.  Juice.  Candy.  Sweets, such as cake, pie, doughnuts, and cookies.  Fried foods. WHAT FOODS CAN I EAT? Eat nutrient-rich foods, which will nourish your body and keep you healthy. The food you should eat also will depend on several factors, including:  The calories you need.  The medicines you take.  Your weight.  Your blood glucose level.  Your blood pressure level.  Your cholesterol level. You should eat a variety of foods, including:  Protein.  Lean cuts of meat.  Proteins low in saturated fats, such as fish, egg whites, and beans. Avoid processed meats.  Fruits and vegetables.  Fruits and vegetables that may help control blood glucose levels, such as apples, mangoes, and   yams.  Dairy products.  Choose fat-free or low-fat dairy products, such as milk, yogurt, and cheese.  Grains, bread, pasta, and rice.  Choose whole grain products, such as multigrain bread, whole oats, and brown rice. These foods may help control blood pressure.  Fats.  Foods containing healthful fats, such as nuts,  avocado, olive oil, canola oil, and fish. DOES EVERYONE WITH DIABETES MELLITUS HAVE THE SAME MEAL PLAN? Because every person with diabetes mellitus is different, there is not one meal plan that works for everyone. It is very important that you meet with a dietitian who will help you create a meal plan that is just right for you.   This information is not intended to replace advice given to you by your health care provider. Make sure you discuss any questions you have with your health care provider.   Document Released: 01/31/2005 Document Revised: 05/27/2014 Document Reviewed: 04/02/2013 Elsevier Interactive Patient Education 2016 Elsevier Inc.  

## 2015-09-21 NOTE — Progress Notes (Signed)
Patient left without being seen.

## 2015-09-21 NOTE — Progress Notes (Signed)
Patient's here for  TCC DKA, HTN and his Kidney.  Patient requesting med refills.

## 2015-09-21 NOTE — Progress Notes (Signed)
[place]  Date of telephone encounter: 09/17/68  Admit date: 09/02/15 Discharge date: 09/16/15  PCP: None  Subjective:  Patient ID: Russell Price, male    DOB: 1961-03-23  Age: 55 y.o. MRN: [id number]  CC: Transitions Of Care and Diabetic Ketoacidosis   HPI Russell Price is a 55 year old man with a history of Type 2 Diabetes Mellitus (A1c 10.9), Hypertension, Chronic Kidney Disease, Hyperlipidemia, Right eye blindness recently hospitalized for acute encephalopathy secondary to diabetic ketoacidosis.  He was found to be unresponsive by his wife and brought to the ED via EMS, seizure activity observed en route.  He was intubated for airway protection. 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 EEG showed some background slowing; he was seen by Neurology - he had left arm weakness; neurology was of the opinion that seizure was provoked by hyperglycemia. He had elevated troponins of 1.22-1.36 which was thought to be secondary to demand ischemia, 2d echo  Revealed an EF of 60%.  He was maintained on IV hydration and an insulin drip for hyperglycemia of >1000, lactate of 14 and was later transitioned to Lantus and sliding scale insulin.He received Diflucan for possible fungal component of sphenoid sinusitis He was underwent comprehensive inpatient rehab and was subsequently discharged .  Interval history: He has no residual weakness ; his wife reports a blood sugar of 40 this mor ing and proceeded to give him 10 units of Lantus rather than 25. Review of his blood sugar log reveals random sugars in the 370 -557 range but these are being taken shortly after his meals.   Outpatient Prescriptions Prior to Visit  Medication Sig Dispense Refill  . amLODipine (NORVASC) 10 MG tablet Take 1 tablet (10 mg total) by mouth daily. Stop lisinopril . 30 tablet 11  . aspirin 81 MG chewable tablet Chew 1 tablet (81 mg total) by mouth daily.     Marland Kitchen atorvastatin (LIPITOR) 40 MG tablet Take 1 tablet (40 mg total) by mouth daily at 6 PM. 30 tablet 11  . carvedilol (COREG) 6.25 MG tablet Take 1 tablet (6.25 mg total) by mouth 2 (two) times daily with a meal. 90 tablet 11  . chlorthalidone (HYGROTON) 25 MG tablet Take 1 tablet (25 mg total) by mouth daily. 30 tablet 1  . insulin glargine (LANTUS) 100 UNIT/ML injection Inject 0.25 mLs (25 Units total) into the skin daily. 10 mL 11  . insulin glargine (LANTUS) 100 UNIT/ML injection Inject 0.2 mLs (20 Units total) into the skin daily. 10 mL 11   No facility-administered medications prior to visit.    ROS Review of Systems  Constitutional: Negative for activity change and appetite change.  HENT: Negative for sinus pressure and sore throat.   Eyes: Positive for visual disturbance (right eye blindness).  Respiratory: Negative for cough, chest tightness and shortness of breath.   Cardiovascular: Negative for chest pain and leg swelling.  Gastrointestinal: Negative for abdominal pain, diarrhea, constipation and abdominal distention.  Endocrine: Negative.   Genitourinary: Negative for dysuria.  Musculoskeletal: Negative for myalgias and joint swelling.  Skin: Negative for rash.  Allergic/Immunologic: Negative.   Neurological: Negative for weakness, light-headedness and numbness.  Psychiatric/Behavioral: Negative for suicidal ideas and dysphoric mood.    Objective:  BP 134/65 mmHg  Pulse 58  Temp(Src) 98.4 F (36.9 C) (Oral)  Resp 18  Ht 5\' 10"  (1.778 m)  Wt 161 lb 12.8 oz (73.392 kg)  BMI 23.22  kg/m2  SpO2 100%  BP/Weight 09/21/2015 09/16/2015 99991111  Systolic BP Q000111Q 0000000 [id number]  Diastolic BP 65 59 62  Wt. (Lbs) 161.8 140.21 170.9  BMI 23.22 20.12 24.52      Physical Exam  Constitutional: He is oriented to person, place, and time. He appears well-developed and well-nourished.  Eyes:  Blind in the right eye Left eye normal.  Cardiovascular: Normal heart sounds and intact  distal pulses.  Bradycardia present.   No murmur heard. Pulmonary/Chest: Effort normal and breath sounds normal. He has no wheezes. He has no rales. He exhibits no tenderness.  Abdominal: Soft. Bowel sounds are normal. He exhibits no distension and no mass. There is no tenderness.  Musculoskeletal: Normal range of motion.  Neurological: He is alert and oriented to person, place, and time.  Motor strength: 5/5 in all extremities  Skin: Skin is warm and dry.     Assessment & Plan:   1. Diabetic ketoacidosis associated with other specified diabetes mellitus ([place]) A1c of 10.9, CBG of 245 in the clinic Single hypoglycemic episode of 40 - I will not make regimen changes as though he is home blood sugars have been in the 370 -557 range Advised to take blood sugars fasting, 2 hours postprandial and at bedtime (notify the clinic in the event of a hypoglycemic episode.) Take Lantus 25 units at night; advised against self-medicating and self adjustment of dose. We'll review blood sugar log at next visit and determine need for increase in dose of Lantus or addition of mealtime coverage Will address diabetic healthcare maintenance at his next office visit. To see the clinical pharmacist today for diabetic education regards to meals. - Glucose (CBG) - Microalbumin/Creatinine Ratio, Urine  2. Essential hypertension Controlled  3. CKD (chronic kidney disease) stage 4, GFR 15-29 ml/min (HCC) Avoid nephrotoxins Currently managed by [facility] - Dr Moshe Cipro - COMPLETE METABOLIC PANEL WITH GFR  4. Blind right eye Patient is currently applying for disability  5. Encephalopathy Resolved with no residual deficits  6. Hyperlipidemia He will need a lipid panel his next office visit   No orders of the defined types were placed in this encounter.    Follow-up: Return in about 1 week (around 09/28/2015) for TCC- follow up on Diabetes mellitus.   Arnoldo Morale MD

## 2015-09-21 NOTE — Patient Instructions (Signed)
Thanks for coming to see me!  Basic Carbohydrate Counting for Diabetes Mellitus Carbohydrate counting is a method for keeping track of the amount of carbohydrates you eat. Eating carbohydrates naturally increases the level of sugar (glucose) in your blood, so it is important for you to know the amount that is okay for you to have in every meal. Carbohydrate counting helps keep the level of glucose in your blood within normal limits. The amount of carbohydrates allowed is different for every person. A dietitian can help you calculate the amount that is right for you. Once you know the amount of carbohydrates you can have, you can count the carbohydrates in the foods you want to eat. Carbohydrates are found in the following foods:  Grains, such as breads and cereals.  Dried beans and soy products.  Starchy vegetables, such as potatoes, peas, and corn.  Fruit and fruit juices.  Milk and yogurt.  Sweets and snack foods, such as cake, cookies, candy, chips, soft drinks, and fruit drinks. CARBOHYDRATE COUNTING There are two ways to count the carbohydrates in your food. You can use either of the methods or a combination of both. Reading the "Nutrition Facts" on [place] The "Nutrition Facts" is an area that is included on the labels of almost all packaged food and beverages in the [place]. It includes the serving size of that food or beverage and information about the nutrients in each serving of the food, including the grams (g) of carbohydrate per serving.  Decide the number of servings of this food or beverage that you will be able to eat or drink. Multiply that number of servings by the number of grams of carbohydrate that is listed on the label for that serving. The total will be the amount of carbohydrates you will be having when you eat or drink this food or beverage. Learning Standard Serving Sizes of Food When you eat food that is not packaged or does not include "Nutrition Facts"  on the label, you need to measure the servings in order to count the amount of carbohydrates.A serving of most carbohydrate-rich foods contains about 15 g of carbohydrates. The following list includes serving sizes of carbohydrate-rich foods that provide 15 g ofcarbohydrate per serving:   1 slice of bread (1 oz) or 1 six-inch tortilla.    of a hamburger bun or English muffin.  4-6 crackers.   cup unsweetened dry cereal.    cup hot cereal.   cup rice or pasta.    cup mashed potatoes or  of a large baked potato.  1 cup fresh fruit or one small piece of fruit.    cup canned or frozen fruit or fruit juice.  1 cup milk.   cup plain fat-free yogurt or yogurt sweetened with artificial sweeteners.   cup cooked dried beans or starchy vegetable, such as peas, corn, or potatoes.  Decide the number of standard-size servings that you will eat. Multiply that number of servings by 15 (the grams of carbohydrates in that serving). For example, if you eat 2 cups of strawberries, you will have eaten 2 servings and 30 g of carbohydrates (2 servings x 15 g = 30 g). For foods such as soups and casseroles, in which more than one food is mixed in, you will need to count the carbohydrates in each food that is included. EXAMPLE OF CARBOHYDRATE COUNTING Sample Dinner  3 oz chicken breast.   cup of brown rice.   cup of corn.  1 cup  milk.   1 cup strawberries with sugar-free whipped topping.  Carbohydrate Calculation Step 1: Identify the foods that contain carbohydrates:   Rice.   Corn.   Milk.   Strawberries. Step 2:Calculate the number of servings eaten of each:   2 servings of rice.   1 serving of corn.   1 serving of milk.   1 serving of strawberries. Step 3: Multiply each of those number of servings by 15 g:   2 servings of rice x 15 g = 30 g.   1 serving of corn x 15 g = 15 g.   1 serving of milk x 15 g = 15 g.   1 serving of strawberries x 15  g = 15 g. Step 4: Add together all of the amounts to find the total grams of carbohydrates eaten: 30 g + 15 g + 15 g + 15 g = 75 g.   This information is not intended to replace advice given to you by your health care provider. Make sure you discuss any questions you have with your health care provider.   Document Released: 05/06/2005 Document Revised: 05/27/2014 Document Reviewed: 04/02/2013 Elsevier Interactive Patient Education 2016 Elsevier Inc.  Blood Glucose Monitoring, Adult Monitoring your blood glucose (also know as blood sugar) helps you to manage your diabetes. It also helps you and your health care provider monitor your diabetes and determine how well your treatment plan is working. WHY SHOULD YOU MONITOR YOUR BLOOD GLUCOSE?  It can help you understand how food, exercise, and medicine affect your blood glucose.  It allows you to know what your blood glucose is at any given moment. You can quickly tell if you are having low blood glucose (hypoglycemia) or high blood glucose (hyperglycemia).  It can help you and your health care provider know how to adjust your medicines.  It can help you understand how to manage an illness or adjust medicine for exercise. WHEN SHOULD YOU TEST? Your health care provider will help you decide how often you should check your blood glucose. This may depend on the type of diabetes you have, your diabetes control, or the types of medicines you are taking. Be sure to write down all of your blood glucose readings so that this information can be reviewed with your health care provider. See below for examples of testing times that your health care provider may suggest. Type 1 Diabetes  Test at least 2 times per day if your diabetes is well controlled, if you are using an insulin pump, or if you perform multiple daily injections.  If your diabetes is not well controlled or if you are sick, you may need to test more often.  It is a good idea to also  test:  Before every insulin injection.  Before and after exercise.  Between meals and 2 hours after a meal.  Occasionally between 2:00 a.m. and 3:00 a.m. Type 2 Diabetes  If you are taking insulin, test at least 2 times per day. However, it is best to test before every insulin injection.  If you take medicines by mouth (orally), test 2 times a day.  If you are on a controlled diet, test once a day.  If your diabetes is not well controlled or if you are sick, you may need to monitor more often. HOW TO MONITOR YOUR BLOOD GLUCOSE Supplies Needed  Blood glucose meter.  Test strips for your meter. Each meter has its own strips. You must use the strips that  go with your own meter.  A pricking needle (lancet).  A device that holds the lancet (lancing device).  A journal or log book to write down your results. Procedure  Wash your hands with soap and water. Alcohol is not preferred.  Prick the side of your finger (not the tip) with the lancet.  Gently milk the finger until a small drop of blood appears.  Follow the instructions that come with your meter for inserting the test strip, applying blood to the strip, and using your blood glucose meter. Other Areas to Get Blood for Testing Some meters allow you to use other areas of your body (other than your finger) to test your blood. These areas are called alternative sites. The most common alternative sites are:  The forearm.  The thigh.  The back area of the lower leg.  The palm of the hand. The blood flow in these areas is slower. Therefore, the blood glucose values you get may be delayed, and the numbers are different from what you would get from your fingers. Do not use alternative sites if you think you are having hypoglycemia. Your reading will not be accurate. Always use a finger if you are having hypoglycemia. Also, if you cannot feel your lows (hypoglycemia unawareness), always use your fingers for your blood glucose  checks. ADDITIONAL TIPS FOR GLUCOSE MONITORING  Do not reuse lancets.  Always carry your supplies with you.  All blood glucose meters have a 24-hour "hotline" number to call if you have questions or need help.  Adjust (calibrate) your blood glucose meter with a control solution after finishing a few boxes of strips. BLOOD GLUCOSE RECORD KEEPING It is a good idea to keep a daily record or log of your blood glucose readings. Most glucose meters, if not all, keep your glucose records stored in the meter. Some meters come with the ability to download your records to your home computer. Keeping a record of your blood glucose readings is especially helpful if you are wanting to look for patterns. Make notes to go along with the blood glucose readings because you might forget what happened at that exact time. Keeping good records helps you and your health care provider to work together to achieve good diabetes management.    This information is not intended to replace advice given to you by your health care provider. Make sure you discuss any questions you have with your health care provider.   Document Released: 05/09/2003 Document Revised: 05/27/2014 Document Reviewed: 09/28/2012 Elsevier Interactive Patient Education 2016 [place].  Diabetes and Standards of Medical Care Diabetes is complicated. You may find that your diabetes team includes a dietitian, nurse, diabetes educator, eye doctor, and more. To help everyone know what is going on and to help you get the care you deserve, the following schedule of care was developed to help keep you on track. Below are the tests, exams, vaccines, medicines, education, and plans you will need. HbA1c test This test shows how well you have controlled your glucose over the past 2-3 months. It is used to see if your diabetes management plan needs to be adjusted.   It is performed at least 2 times a year if you are meeting treatment goals.  It is  performed 4 times a year if therapy has changed or if you are not meeting treatment goals. Blood pressure test  This test is performed at every routine medical visit. The goal is less than 140/90 mm Hg for most  people, but 130/80 mm Hg in some cases. Ask your health care provider about your goal. Dental exam  Follow up with the dentist regularly. Eye exam  If you are diagnosed with type 1 diabetes as a child, get an exam upon reaching the age of 57 years or older and having had diabetes for 3-5 years. Yearly eye exams are recommended after that initial eye exam.  If you are diagnosed with type 1 diabetes as an adult, get an exam within 5 years of diagnosis and then yearly.  If you are diagnosed with type 2 diabetes, get an exam as soon as possible after the diagnosis and then yearly. Foot care exam  Visual foot exams are performed at every routine medical visit. The exams check for cuts, injuries, or other problems with the feet.  You should have a complete foot exam performed every year. This exam includes an inspection of the structure and skin of your feet, a check of the pulses in your feet, and a check of the sensation in your feet.  Type 1 diabetes: The first exam is performed 5 years after diagnosis.  Type 2 diabetes: The first exam is performed at the time of diagnosis.  Check your feet nightly for cuts, injuries, or other problems with your feet. Tell your health care provider if anything is not healing. Kidney function test (urine microalbumin)  This test is performed once a year.  Type 1 diabetes: The first test is performed 5 years after diagnosis.  Type 2 diabetes: The first test is performed at the time of diagnosis.  A serum creatinine and estimated glomerular filtration rate (eGFR) test is done once a year to assess the level of chronic kidney disease (CKD), if present. Lipid profile (cholesterol, HDL, LDL, triglycerides)  Performed every 5 years for most  people.  The goal for LDL is less than 100 mg/dL. If you are at high risk, the goal is less than 70 mg/dL.  The goal for HDL is 40 mg/dL-50 mg/dL for men and 50 mg/dL-60 mg/dL for women. An HDL cholesterol of 60 mg/dL or higher gives some protection against heart disease.  The goal for triglycerides is less than 150 mg/dL. Immunizations  The flu (influenza) vaccine is recommended yearly for every person 7 months of age or older who has diabetes.  The pneumonia (pneumococcal) vaccine is recommended for every person 64 years of age or older who has diabetes. Adults 39 years of age or older may receive the pneumonia vaccine as a series of two separate shots.  The hepatitis B vaccine is recommended for adults shortly after they have been diagnosed with diabetes.  The Tdap (tetanus, diphtheria, and pertussis) vaccine should be given:  According to normal childhood vaccination schedules, for children.  Every 10 years, for adults who have diabetes. Diabetes self-management education  Education is recommended at diagnosis and ongoing as needed. Treatment plan  Your treatment plan is reviewed at every medical visit.   This information is not intended to replace advice given to you by your health care provider. Make sure you discuss any questions you have with your health care provider.   Document Released: 03/03/2009 Document Revised: 05/27/2014 Document Reviewed: 10/06/2012 Elsevier Interactive Patient Education 2016 Elsevier Inc.  Type 2 Diabetes Mellitus, Adult Type 2 diabetes mellitus is a long-term (chronic) disease. In type 2 diabetes:  The pancreas does not make enough of a hormone called insulin.  The cells in the body do not respond as well to the  insulin that is made.  Both of the above can happen. Normally, insulin moves sugars from food into tissue cells. This gives you energy. If you have type 2 diabetes, sugars cannot be moved into tissue cells. This causes high blood  sugar (hyperglycemia).  Your doctors will set personal treatment goals for you based on your age, your medicines, how long you have had diabetes, and any other medical conditions you have. Generally, the goal of treatment is to maintain the following blood glucose levels:  Before meals (preprandial): 80-130 mg/dL.  After meals (postprandial): below 180 mg/dL.  A1c: less than 6.5-7%. HOME CARE  Have your hemoglobin A1c level checked twice a year. The level shows if your diabetes is under control or out of control.  Test your blood sugar level every day as told by your doctor.  Check your ketone levels by testing your pee (urine) when you are sick and as told.  Take your diabetes or insulin medicine as told by your doctor.  Never run out of insulin.  Adjust how much insulin you give yourself based on how many carbs (carbohydrates) you eat. Carbs are in many foods, such as fruits, vegetables, whole grains, and dairy products.  Have a healthy snack between every healthy meal. Have 3 meals and 3 snacks a day.  Lose weight if you are overweight.  Carry a medical alert card or wear your medical alert jewelry.  Carry a 15-gram carb snack with you at all times. Examples include:  Glucose pills, 3 or 4.  Glucose gel, 15-gram tube.  Raisins, 2 tablespoons (24 grams).  Jelly beans, 6.  Animal crackers, 8.  Regular (not diet) pop, 4 ounces (120 milliliters).  Gummy treats, 9.  Notice low blood sugar (hypoglycemia) symptoms, such as:  Shaking (tremors).  Trouble thinking clearly.  Sweating.  Faster heart rate.  Headache.  Dry mouth.  Hunger.  Crabbiness (irritability).  Being worried or tense (anxious).  Restless sleep.  A change in speech or coordination.  Confusion.  Treat low blood sugar right away. If you are alert and can swallow, follow the 15:15 rule:  Take 15-20 grams of a rapid-acting glucose or carb. This includes glucose gel, glucose pills, or 4  ounces (120 milliliters) of fruit juice, regular pop, or low-fat milk.  Check your blood sugar level 15 minutes after taking the glucose.  Take 15-20 grams more of glucose if the repeat blood sugar level is still 70 mg/dL (milligrams/deciliter) or below.  Eat a meal or snack within 1 hour of the blood sugar levels going back to normal.  Notice early symptoms of high blood sugar, such as:  Being really thirsty or drinking a lot (polydipsia).  Peeing a lot (polyuria).  Do at least 150 minutes of physical activity a week or as told.  Split the 150 minutes of activity up during the week. Do not do 150 minutes of activity in one day.  Perform exercises, such as weight lifting, at least 2 times a week or as told.  Spend no more than 90 minutes at one time inactive.  Adjust your insulin or food intake as needed if you start a new exercise or sport.  Follow your sick-day plan when you are not able to eat or drink as usual.  Do not smoke, chew tobacco, or use electronic cigarettes.  Women who are not pregnant should drink no more than 1 drink a day. Men should drink no more than 2 drinks a day.  Only drink alcohol  with food.  Ask your doctor if alcohol is safe for you.  Tell your doctor if you drink alcohol several times during the week.  See your doctor regularly.  Schedule an eye exam soon after you are told you have diabetes. Schedule exams once every year.  Check your skin and feet every day. Check for cuts, bruises, redness, nail problems, bleeding, blisters, or sores. A doctor should do a foot exam once a year.  Brush your teeth and gums twice a day. Floss once a day. Visit your dentist regularly.  Share your diabetes plan with your workplace or school.  Keep your shots that fight diseases (vaccines) up to date.  Get a flu (influenza) shot every year.  Get a pneumonia shot. If you are 50 years of age or older and you have never gotten a pneumonia shot, you might need to  get two shots.  Ask your doctor which other shots you should get.  Learn how to deal with stress.  Get diabetes education and support as needed.  Ask your doctor for special help if:  You need help to maintain or improve how you do things on your own.  You need help to maintain or improve the quality of your life.  You have foot or hand problems.  You have trouble cleaning yourself, dressing, eating, or doing physical activity. GET HELP IF:  You are unable to eat or drink for more than 6 hours.  You feel sick to your stomach (nauseous) or throw up (vomit) for more than 6 hours.  Your blood sugar level is over 240 mg/dL.  There is a change in mental status.  You get another serious illness.  You have watery poop (diarrhea) for more than 6 hours.  You have been sick or have had a fever for 2 or more days and are not getting better.  You have pain when you are active. GET HELP RIGHT AWAY IF:  You have trouble breathing.  Your ketone levels are higher than your doctor says they should be. MAKE SURE YOU:  Understand these instructions.  Will watch your condition.  Will get help right away if you are not doing well or get worse.   This information is not intended to replace advice given to you by your health care provider. Make sure you discuss any questions you have with your health care provider.   Document Released: 02/13/2008 Document Revised: 09/20/2014 Document Reviewed: 12/06/2011 Elsevier Interactive Patient Education [employer].

## 2015-09-22 ENCOUNTER — Telehealth: Payer: Self-pay | Admitting: *Deleted

## 2015-09-22 LAB — MICROALBUMIN / CREATININE URINE RATIO
CREATININE, URINE: 160 mg/dL (ref 20–370)
Microalb Creat Ratio: 643 mcg/mg creat — ABNORMAL HIGH (ref <30)
Microalb, Ur: 102.8 mg/dL

## 2015-09-22 NOTE — Telephone Encounter (Signed)
[facility] called to let Dr Posey Pronto know that this patient will be a non admit because they have been unable to make contact.  No answer on phone and no VM.  Left message with emergency contact and they have not responded.

## 2015-09-26 ENCOUNTER — Telehealth: Payer: Self-pay

## 2015-09-26 NOTE — Telephone Encounter (Signed)
Attempted to contact the patient to check on his status and to discuss scheduling a follow up appointment with Dr Jarold Song at the [facility].  Call placed to # [phone] (M) and the voicemail box was full, unable to leave a message.

## 2015-09-27 ENCOUNTER — Telehealth: Payer: Self-pay

## 2015-09-27 NOTE — Telephone Encounter (Signed)
He was scheduled to see me 1 week from his last office visit at which time I will reassess the need for mealtime coverage. He can continue Lantus 30 units as instructed by his nephrologist.

## 2015-09-27 NOTE — Telephone Encounter (Signed)
Voicemail received from patient requesting return call. This Case Manager placed return call to patient. Inquired about patient's status. Patient indicated he was doing "good" but indicated his blood glucose was sometimes high. Patient indicated he was keeping a blood glucose log but was only able to give this Case Manager two blood glucose readings. He indicated his fasting blood glucose this morning was "128" and his blood glucose last night (09/26/15) at bedtime was "372". Will route encounter to Dr. Jarold Song so she is aware of elevated blood glucose on 09/26/15.  Encouraged patient to continue keeping blood glucose log and instructed him that Dr. Jarold Song wanted him to take his blood sugars fasting, 2 hours postprandial, and at bedtime. Patient verbalized understanding. Also informed patient to administer 25 U of Lantus daily. Patient indicated he had a Nephrology appointment on 09/26/15, and Nephrologist increased his Lantus dosage to 30 U daily.    Informed patient a follow-up appointment needed with Dr. Jarold Song. Appointment scheduled for 10/05/15 at 1015. Also informed him that he would benefit from continued diabetes education with Nicoletta Ba, Clinical Pharmacist. Appointment scheduled for 10/05/15 at 1100. Patient appreciative of appointments.

## 2015-09-27 NOTE — Telephone Encounter (Signed)
This Case Manager placed call to patient to check status and to discuss scheduling [facility] follow-up appointment with Dr. Jarold Song and appointment with Nicoletta Ba, Clinical Pharmacist, for continued diabetes education. Call placed to [phone]; unable to reach patient or leave voicemail as recording indicated "voicemail box full and cannot accept messages at this time." In addition, call placed to [phone]; unable to reach patient. HIPPA compliant voicemail left for patient requesting return call.

## 2015-09-28 ENCOUNTER — Telehealth: Payer: Self-pay | Admitting: General Practice

## 2015-09-28 NOTE — Telephone Encounter (Signed)
Pt. Came into facility requesting a refill for test strips. Pt. Was seen on 09/21/15. Please f/u with pt.

## 2015-09-29 ENCOUNTER — Other Ambulatory Visit: Payer: Self-pay | Admitting: Pharmacist

## 2015-09-29 ENCOUNTER — Telehealth: Payer: Self-pay

## 2015-09-29 MED ORDER — GLUCOSE BLOOD VI STRP: ORAL_STRIP | Status: DC

## 2015-09-29 MED FILL — TRUE METRIX TEST STRIP: 25 days supply | Qty: 100 | Fill #0

## 2015-09-29 NOTE — Telephone Encounter (Signed)
-----   Message from Arnoldo Morale, MD sent at 09/22/2015  2:43 AM EDT ----- Please inform his renal function shows a decline and he is advised to keep his appointment with his Nephrologist. Glucose is also elevated and compliance with prescribed Lantus dose is advised.

## 2015-09-29 NOTE — Telephone Encounter (Signed)
-----   Message from Arnoldo Morale, MD sent at 09/25/2015  9:03 AM EDT ----- Labs reveal microalbuminuria which is an evidence of hid chronic kidney disease.

## 2015-09-29 NOTE — Telephone Encounter (Signed)
Placed call to patient, patient phone rung and disconnected.

## 2015-09-29 NOTE — Telephone Encounter (Signed)
Placed call to patient, patient phone rung and then disconnected.

## 2015-10-01 DIAGNOSIS — G9340 Encephalopathy, unspecified: Secondary | ICD-10-CM | POA: Insufficient documentation

## 2015-10-01 DIAGNOSIS — R29898 Other symptoms and signs involving the musculoskeletal system: Secondary | ICD-10-CM | POA: Insufficient documentation

## 2015-10-03 ENCOUNTER — Telehealth: Payer: Self-pay

## 2015-10-03 NOTE — Telephone Encounter (Signed)
Message received from [facility] inquiring what brand of of blood glucose  test strips were ordered as they had received a prescription for test strips.  This CM confirmed with Phillis Knack, [facility] Pharmacist that the patient received his glucometer and testing supplies from the [facility], not [facility].

## 2015-10-04 ENCOUNTER — Telehealth: Payer: Self-pay

## 2015-10-04 NOTE — Telephone Encounter (Signed)
Another call was made to the patient and it was successfully completed. Confirmed his appointments tomorrow, 10/05/15 at the [facility] @ 1015 with Dr Jarold Song and @ 1100 with Nicoletta Ba [facility] and he stated that he has transportation to the clinic.   Instructed him to bring all of his medication bottles with him as well as his blood sugar log.  He stated that he understood. He noted that his blood sugars have been running from 100's -300's and he stated that he has been checking his blood sugars fasting as well as 2 hours after eating. He reported  that he has been taking lantus 30 units daily as instructed.   He stated  that he will notify the doctor tomorrow if he needs refills for any medications. He also said that he has the testing supplies for his glucometer.    He also reported that he received a letter/questionnaire  regarding his disability and he still needs to complete the form. Instructed him to bring the letter with him tomorrow to his appointment if he has any questions about completing it.   No other problems/questions reported at this time.

## 2015-10-04 NOTE — Telephone Encounter (Signed)
Attempted to contact the patient to check on his status and to remind him of his appointments at the [facility] at [facility] tomorrow, 10/05/15 @ 1015 with Dr Jarold Song  and his appointment with Nicoletta Ba, [facility] @ 1100.  Calls placed twice to #  [phone] (M) and each time the phone rang 7 times and then stopped, no option to leave a voicemail message.

## 2015-10-05 ENCOUNTER — Ambulatory Visit: Payer: Medicaid Other | Attending: Family Medicine | Admitting: Family Medicine

## 2015-10-05 ENCOUNTER — Ambulatory Visit (HOSPITAL_BASED_OUTPATIENT_CLINIC_OR_DEPARTMENT_OTHER): Payer: Self-pay | Admitting: Pharmacist

## 2015-10-05 VITALS — BP 140/60 | HR 55 | Temp 98.1°F | Resp 18 | Ht 70.0 in | Wt 159.2 lb

## 2015-10-05 DIAGNOSIS — Z794 Long term (current) use of insulin: Secondary | ICD-10-CM | POA: Insufficient documentation

## 2015-10-05 DIAGNOSIS — I129 Hypertensive chronic kidney disease with stage 1 through stage 4 chronic kidney disease, or unspecified chronic kidney disease: Secondary | ICD-10-CM | POA: Insufficient documentation

## 2015-10-05 DIAGNOSIS — E1165 Type 2 diabetes mellitus with hyperglycemia: Secondary | ICD-10-CM | POA: Insufficient documentation

## 2015-10-05 DIAGNOSIS — E119 Type 2 diabetes mellitus without complications: Secondary | ICD-10-CM

## 2015-10-05 DIAGNOSIS — Z7982 Long term (current) use of aspirin: Secondary | ICD-10-CM | POA: Diagnosis not present

## 2015-10-05 DIAGNOSIS — Z79899 Other long term (current) drug therapy: Secondary | ICD-10-CM | POA: Diagnosis not present

## 2015-10-05 DIAGNOSIS — E1149 Type 2 diabetes mellitus with other diabetic neurological complication: Secondary | ICD-10-CM

## 2015-10-05 DIAGNOSIS — N184 Chronic kidney disease, stage 4 (severe): Secondary | ICD-10-CM

## 2015-10-05 DIAGNOSIS — E1140 Type 2 diabetes mellitus with diabetic neuropathy, unspecified: Secondary | ICD-10-CM | POA: Insufficient documentation

## 2015-10-05 DIAGNOSIS — I10 Essential (primary) hypertension: Secondary | ICD-10-CM

## 2015-10-05 LAB — GLUCOSE, POCT (MANUAL RESULT ENTRY): POC Glucose: 279 mg/dl — AB (ref 70–99)

## 2015-10-05 MED ORDER — INSULIN GLARGINE 100 UNIT/ML ~~LOC~~ SOLN: 35.0000 [IU] | Freq: Every day | SUBCUTANEOUS | Status: DC

## 2015-10-05 MED ORDER — CHLORTHALIDONE 25 MG PO TABS: 25.0000 mg | ORAL_TABLET | Freq: Every day | ORAL | Status: DC

## 2015-10-05 MED ORDER — INSULIN ASPART 100 UNIT/ML ~~LOC~~ SOLN: 0.0000 [IU] | Freq: Three times a day (TID) | SUBCUTANEOUS | Status: DC

## 2015-10-05 MED ORDER — GABAPENTIN 300 MG PO CAPS: 300.0000 mg | ORAL_CAPSULE | Freq: Two times a day (BID) | ORAL | Status: DC

## 2015-10-05 MED FILL — AMLODIPINE BESYLATE 10 MG T: 10 | 30 days supply | Qty: 30 | Fill #1

## 2015-10-05 MED FILL — ATORVASTATIN 40 MG TABLET: 40 | 30 days supply | Qty: 30 | Fill #1

## 2015-10-05 MED FILL — GABAPENTIN 300 MG CAPSULE: 300 | 30 days supply | Qty: 60 | Fill #0

## 2015-10-05 MED FILL — CARVEDILOL 6.25 MG TABLET: 6.25 | 30 days supply | Qty: 60 | Fill #1

## 2015-10-05 MED FILL — !LANTUS SOLOSTAR 100UNITS/M: 100 | 25 days supply | Qty: 9 | Fill #0

## 2015-10-05 MED FILL — !NOVOLOG 100UNITS/ML VIAL: 100/ML | 30 days supply | Qty: 10 | Fill #0

## 2015-10-05 MED FILL — ?CHLORTHALIDONE 25 MG TABLE: 25 | 30 days supply | Qty: 30 | Fill #1

## 2015-10-05 NOTE — Progress Notes (Signed)
[place]  Date of telephone encounter: 09/17/68  Admit date: 09/02/15 Discharge date: 09/16/15  PCP: None  Subjective:  Patient ID: Russell Price, male    DOB: 11-21-1960  Age: 55 y.o. MRN: [id number]  CC: Follow-up and Diabetic Ketoacidosis   HPI Russell Price is a 55 year old male with a history of type 2 diabetes mellitus (A1c 10.9), hypertension, stage IV chronic kidney disease who comes in for follow-up for the transitional care clinic; he had a recent hospitalization for metabolic encephalopathy secondary to diabetic ketoacidosis.  He is currently taking 30 units of Lantus which was increased from 25 by his nephrologist and he has his blood sugar log which reveals random sugars in the 186 - 483 range and fasting sugars from 78-289. He is scheduled to see the clinical pharmacist today for diabetic teaching. He complains of some numbness in his hand which are intermittent but has no numbness in his feet. Compliant with all his other medications  He will be beginning his Ramadan fast in 3 days and will only eat and actually also morning and late at night.  Outpatient Prescriptions Prior to Visit  Medication Sig Dispense Refill  . amLODipine (NORVASC) 10 MG tablet Take 1 tablet (10 mg total) by mouth daily. Stop lisinopril . 30 tablet 11  . aspirin 81 MG chewable tablet Chew 1 tablet (81 mg total) by mouth daily.    Marland Kitchen atorvastatin (LIPITOR) 40 MG tablet Take 1 tablet (40 mg total) by mouth daily at 6 PM. 30 tablet 11  . carvedilol (COREG) 6.25 MG tablet Take 1 tablet (6.25 mg total) by mouth 2 (two) times daily with a meal. 90 tablet 11  . glucose blood test strip Use as instructed 100 each 12  . chlorthalidone (HYGROTON) 25 MG tablet Take 1 tablet (25 mg total) by mouth daily. 30 tablet 1  . insulin glargine (LANTUS) 100 UNIT/ML injection Inject 0.25 mLs (25 Units total) into the skin daily. 10 mL 11   No facility-administered medications prior to visit.     ROS Review of Systems  Constitutional: Negative for activity change and appetite change.  HENT: Negative for sinus pressure and sore throat.   Eyes: Negative for visual disturbance.  Respiratory: Negative for cough, chest tightness and shortness of breath.   Cardiovascular: Negative for chest pain and leg swelling.  Gastrointestinal: Negative for abdominal pain, diarrhea, constipation and abdominal distention.  Endocrine: Negative.   Genitourinary: Negative for dysuria.  Musculoskeletal: Negative for myalgias and joint swelling.  Skin: Negative for rash.  Allergic/Immunologic: Negative.   Neurological: Positive for numbness. Negative for weakness and light-headedness.  Psychiatric/Behavioral: Negative for suicidal ideas and dysphoric mood.    Objective:  BP 140/60 mmHg  Pulse 55  Temp(Src) 98.1 F (36.7 C) (Oral)  Resp 18  Ht 5\' 10"  (1.778 m)  Wt 159 lb 3.2 oz (72.213 kg)  BMI 22.84 kg/m2  SpO2 100%  BP/Weight 10/05/2015 09/21/2015 123XX123  Systolic BP [id number]  Diastolic BP 60 65 59  Wt. (Lbs) 159.2 161.8 140.21  BMI 22.84 23.22 20.12      Physical Exam  Constitutional: He is oriented to person, place, and time. He appears well-developed and well-nourished.  Cardiovascular: Normal rate, normal heart sounds and intact distal pulses.   No murmur heard. Pulmonary/Chest: Effort normal and breath sounds normal. He has no wheezes. He has no rales. He exhibits no tenderness.  Abdominal: Soft. Bowel sounds are normal. He exhibits no distension and no mass.  There is no tenderness.  Musculoskeletal: Normal range of motion.  Neurological: He is alert and oriented to person, place, and time.  Skin: Skin is warm and dry.    Lab Results  Component Value Date   HGBA1C 10.9 07/20/2015    Assessment & Plan:   1. Diabetes mellitus type 2 in nonobese (HCC) Uncontrolled with A1c of 10.9 with persisting hyperglycemia evidenced by elevated blood sugars from blood sugar  log. Increase Lantus to 35 units at bedtime NovoLog sliding scale added which he will use 3 times daily before meals and once the Ramadan fast begins he will take twice daily early in the morning at late at night when he breaks his fast. Educated about hypoglycemia and he knows to call the clinic. I'll review blood sugar log at next visit. - Glucose (CBG) - insulin glargine (LANTUS) 100 UNIT/ML injection; Inject 0.35 mLs (35 Units total) into the skin daily.  Dispense: 10 mL; Refill: 3 - insulin aspart (NOVOLOG) 100 UNIT/ML injection; Inject 0-12 Units into the skin 3 (three) times daily before meals.  Dispense: 3 vial; Refill: 3 - Lipid panel; Future - Microalbumin / creatinine urine ratio; Future  2. Essential hypertension Controlled - chlorthalidone (HYGROTON) 25 MG tablet; Take 1 tablet (25 mg total) by mouth daily.  Dispense: 30 tablet; Refill: 3  3. CKD (chronic kidney disease) stage 4, GFR 15-29 ml/min (HCC) Stable Managed by chronic kidney  4. Other diabetic neurological complication associated with type 2 diabetes mellitus ([place]) Commenced on Neurontin. - gabapentin (NEURONTIN) 300 MG capsule; Take 1 capsule (300 mg total) by mouth 2 (two) times daily.  Dispense: 60 capsule; Refill: 3   Meds ordered this encounter  Medications  . gabapentin (NEURONTIN) 300 MG capsule    Sig: Take 1 capsule (300 mg total) by mouth 2 (two) times daily.    Dispense:  60 capsule    Refill:  3  . insulin glargine (LANTUS) 100 UNIT/ML injection    Sig: Inject 0.35 mLs (35 Units total) into the skin daily.    Dispense:  10 mL    Refill:  3    Discontinue previous dose  . chlorthalidone (HYGROTON) 25 MG tablet    Sig: Take 1 tablet (25 mg total) by mouth daily.    Dispense:  30 tablet    Refill:  3  . insulin aspart (NOVOLOG) 100 UNIT/ML injection    Sig: Inject 0-12 Units into the skin 3 (three) times daily before meals.    Dispense:  3 vial    Refill:  3    As per sliding scale     Follow-up: Return in about 2 weeks (around 10/19/2015) for Follow-up on diabetes mellitus.   Arnoldo Morale MD

## 2015-10-05 NOTE — Patient Instructions (Signed)
Diabetes Mellitus and Food It is important for you to manage your blood sugar (glucose) level. Your blood glucose level can be greatly affected by what you eat. Eating healthier foods in the appropriate amounts throughout the day at about the same time each day will help you control your blood glucose level. It can also help slow or prevent worsening of your diabetes mellitus. Healthy eating may even help you improve the level of your blood pressure and reach or maintain a healthy weight.  General recommendations for healthful eating and cooking habits include:  Eating meals and snacks regularly. Avoid going long periods of time without eating to lose weight.  Eating a diet that consists mainly of plant-based foods, such as fruits, vegetables, nuts, legumes, and whole grains.  Using low-heat cooking methods, such as baking, instead of high-heat cooking methods, such as deep frying. Work with your dietitian to make sure you understand how to use the Nutrition Facts information on food labels. HOW CAN FOOD AFFECT ME? Carbohydrates Carbohydrates affect your blood glucose level more than any other type of food. Your dietitian will help you determine how many carbohydrates to eat at each meal and teach you how to count carbohydrates. Counting carbohydrates is important to keep your blood glucose at a healthy level, especially if you are using insulin or taking certain medicines for diabetes mellitus. Alcohol Alcohol can cause sudden decreases in blood glucose (hypoglycemia), especially if you use insulin or take certain medicines for diabetes mellitus. Hypoglycemia can be a life-threatening condition. Symptoms of hypoglycemia (sleepiness, dizziness, and disorientation) are similar to symptoms of having too much alcohol.  If your health care provider has given you approval to drink alcohol, do so in moderation and use the following guidelines:  Women should not have more than one drink per day, and men  should not have more than two drinks per day. One drink is equal to:  12 oz of beer.  5 oz of wine.  1 oz of hard liquor.  Do not drink on an empty stomach.  Keep yourself hydrated. Have water, diet soda, or unsweetened iced tea.  Regular soda, juice, and other mixers might contain a lot of carbohydrates and should be counted. WHAT FOODS ARE NOT RECOMMENDED? As you make food choices, it is important to remember that all foods are not the same. Some foods have fewer nutrients per serving than other foods, even though they might have the same number of calories or carbohydrates. It is difficult to get your body what it needs when you eat foods with fewer nutrients. Examples of foods that you should avoid that are high in calories and carbohydrates but low in nutrients include:  Trans fats (most processed foods list trans fats on the Nutrition Facts label).  Regular soda.  Juice.  Candy.  Sweets, such as cake, pie, doughnuts, and cookies.  Fried foods. WHAT FOODS CAN I EAT? Eat nutrient-rich foods, which will nourish your body and keep you healthy. The food you should eat also will depend on several factors, including:  The calories you need.  The medicines you take.  Your weight.  Your blood glucose level.  Your blood pressure level.  Your cholesterol level. You should eat a variety of foods, including:  Protein.  Lean cuts of meat.  Proteins low in saturated fats, such as fish, egg whites, and beans. Avoid processed meats.  Fruits and vegetables.  Fruits and vegetables that may help control blood glucose levels, such as apples, mangoes, and   yams.  Dairy products.  Choose fat-free or low-fat dairy products, such as milk, yogurt, and cheese.  Grains, bread, pasta, and rice.  Choose whole grain products, such as multigrain bread, whole oats, and brown rice. These foods may help control blood pressure.  Fats.  Foods containing healthful fats, such as nuts,  avocado, olive oil, canola oil, and fish. DOES EVERYONE WITH DIABETES MELLITUS HAVE THE SAME MEAL PLAN? Because every person with diabetes mellitus is different, there is not one meal plan that works for everyone. It is very important that you meet with a dietitian who will help you create a meal plan that is just right for you.   This information is not intended to replace advice given to you by your health care provider. Make sure you discuss any questions you have with your health care provider.   Document Released: 01/31/2005 Document Revised: 05/27/2014 Document Reviewed: 04/02/2013 Elsevier Interactive Patient Education 2016 Elsevier Inc.  

## 2015-10-05 NOTE — Progress Notes (Signed)
S:    Patient presents for diabetes education at the request of Dr. Jarold Song  Patient reports adherence with medications.  Current diabetes medications include: Lantus and Novolog (newly prescribed sliding scale)  Patient denies hypoglycemic events.  Patient reported dietary habits: Eats 3 meals/day. However, patient is going to be fasting during daylight hours for religious reasons so he will only be eating in the morning and at night.   Patient reported exercise habits: none   O:  Lab Results  Component Value Date   HGBA1C 10.9 07/20/2015   There were no vitals filed for this visit.  A/P: Diabetes longstanding currently UNcontrolled based on A1c of 10.9. Patient denies hypoglycemic events and is able to verbalize appropriate hypoglycemia management plan. Patient reports adherence with medication. Control is suboptimal due to dietary indiscretion.  Continue medications as prescribed by Dr. Jarold Song. Provided education on the sliding scale for the Novolog and patient was able to teach it back to me and tell me how many units he would administer if his blood glucose was a certain level.  Also provided extensive dietary counseling, with a focus on the plate method and basic carbohydrate counting. Reviewed foods high in carbs and foods with fewer carbs. Also reviewed portion sizes and how to read a food/nutrition label.   Next A1C anticipated June 2017.    Written patient instructions provided, including information on the plate method and dietary planning.  Total time in face to face counseling 20 minutes.   Follow up in Pharmacist Clinic Visit PRN. Next visit with Dr. Jarold Song.

## 2015-10-05 NOTE — Telephone Encounter (Signed)
Patient was in office today, 10/05/2015. Patient was given lab results, verbalize understanding with no further questions.

## 2015-10-05 NOTE — Progress Notes (Signed)
Patient's here for f/up DKA. Patient denies any pain today.  Patient blood sugar was 279 patient took his meds at 5am and ate breakfast at 9am.  Patient c/o numbness in his finger tips.  Patient denies any pain today.  Patient requesting refill of lantus, and Chlorthalidone.

## 2015-10-06 ENCOUNTER — Encounter: Payer: Self-pay | Admitting: Family Medicine

## 2015-10-09 ENCOUNTER — Ambulatory Visit: Payer: Medicaid Other | Attending: Family Medicine

## 2015-10-09 ENCOUNTER — Telehealth: Payer: Self-pay

## 2015-10-09 DIAGNOSIS — E119 Type 2 diabetes mellitus without complications: Secondary | ICD-10-CM | POA: Insufficient documentation

## 2015-10-09 LAB — LIPID PANEL
CHOL/HDL RATIO: 2.4 ratio (ref <=5.0)
CHOLESTEROL: 92 mg/dL — AB (ref 125–200)
HDL: 38 mg/dL — AB (ref >=40)
LDL Cholesterol: 44 mg/dL (ref <130)
Triglycerides: 49 mg/dL (ref <150)
VLDL: 10 mg/dL (ref <30)

## 2015-10-09 NOTE — Telephone Encounter (Signed)
Call placed to the patient to check his blood sugars.  He located the paper that he had the sliding scale orders on and was able to read the orders to this CM.  He then noted that his blood sugar this morning at approximately 1000 before he ate  was 125 and then after eating was 120.  He did not take any novolog and could not state the exact times that the blood sugar was checked. He then said that the checked his blood sugar about 1500 and it was 120 and then checked it an hour later after eating and it was 242 and he took novolog 8 units. He also reported that he is taking the lantus 35 units at bedtime. He said that he usually eats 4 meals during the day - approximate times 1000, 1220/1300, 1500/166 and 1800/2000.  This CM reviewed with him the sliding scale orders and instructed him to check his blood sugars 3 times a day prior to eating his meal and the novolog is based on the blood sugar reading prior to eating, not a blood sugar reading an hour after he eats. He was able to repeat those instructions back to CM.  Reviewed the s/s of hypoglycemia with him and how to treat and discussed the danger of taking the insulin incorrectly. He stated that he understood.  Update provided to Dr Jarold Song and Nicoletta Ba, [facility].   Discussed with the patient the importance of adhering to his insulin orders and the need to review what he eats for his meals ( full meals v snacks) and  he was agreeable to meeting with Nicoletta Ba , [facility] again and an appointment was scheduled for 10/10/15 @ 1000. The patient was appreciative of the appointment and stated that he would be there.

## 2015-10-09 NOTE — Patient Instructions (Signed)
Patient is aware of receiving a FU phone call with results.

## 2015-10-10 ENCOUNTER — Ambulatory Visit: Payer: Self-pay | Attending: Family Medicine | Admitting: Pharmacist

## 2015-10-10 ENCOUNTER — Encounter: Payer: Self-pay | Admitting: Pharmacist

## 2015-10-10 ENCOUNTER — Telehealth: Payer: Self-pay

## 2015-10-10 DIAGNOSIS — E119 Type 2 diabetes mellitus without complications: Secondary | ICD-10-CM

## 2015-10-10 LAB — MICROALBUMIN / CREATININE URINE RATIO
CREATININE, URINE: 60 mg/dL (ref 20–370)
MICROALB UR: 36.1 mg/dL
Microalb Creat Ratio: 602 mcg/mg creat — ABNORMAL HIGH (ref <30)

## 2015-10-10 NOTE — Telephone Encounter (Signed)
Met with the patient today when he was at the office to meet with Nicoletta Ba, [facility].  Reviewed with him the importance of checking his blood sugars before he eats, not after when determining if he needs novolog. Also reviewed when he eats his meals and what he has for meals.  Discussed the plan for eating/checking blood sugars when he starts fasting on 10/13/15. Stressed the importance of adhering to the insulin regime ordered and discussed how to manage hypoglycemia.  The patient was able to state how he would monitor/manage a hypoglycemic episode. He was also able to state that he will only check his blood sugars before eating to determine if he needs novolog. During his fast, he will only check his blood sugars to determine if he needs novolog twice a day - early morning and late at night when he plans to eat.  Reminded him that he can call this office with questions and confirmed his appointment with Dr Jarold Song 10/19/15 with Dr Jarold Song.

## 2015-10-10 NOTE — Patient Instructions (Signed)
Thanks for coming to see me!  You are doing a great job.  Check your blood sugar BEFORE you eat and take the Novolog BEFORE you eat.  Follow up with Dr. Jarold Song

## 2015-10-10 NOTE — Progress Notes (Signed)
S:    Patient presents for diabetes education at the request of Dr. Jarold Song. Patient spoke with Eden Lathe on 10/10/15 and was possibly not using his sliding scale insulin correctly so he is here today to review the sliding scale and blood glucose monitoring.  Patient reports adherence with medications.  Current diabetes medications include: Lantus and Novolog  Patient denies hypoglycemic events.  Patient reported dietary habits: Eats 3 meals/day. However, patient is going to be fasting during daylight hours for religious reasons so he will only be eating in the morning and at night. This will start this Saturday.  He reports eating eggs and whole wheat toast for breakfast and then a sandwich for lunch, meat for a snack around 4pm, and then eggs and toast again for dinner. He reports that his meals will be much bigger when he starts fasting.   Patient reported exercise habits: none  Patient has been checking his blood glucose about an hour after he eats and then doses his Novolog based on that.    O:  Lab Results  Component Value Date   HGBA1C 10.9 07/20/2015   There were no vitals filed for this visit.  A/P: Diabetes longstanding currently UNcontrolled based on A1c of 10.9. Patient denies hypoglycemic events and is able to verbalize appropriate hypoglycemia management plan. Patient reports adherence with medication. Control is suboptimal due to dietary indiscretion.  Continue medications as prescribed by Dr. Jarold Song. Reviewed sliding scale with patient and he was able to teach back when he would use certain doses. Instructed patient to take Novolog BEFORE he eats and not after as this will likely lead to hypoglycemia. Patient is to only check his blood glucose before he eats to avoid confusion about when to take the Novolog. Patient will not take Novolog if he does not eat or if he only has a small snack. Patient was able to teach this back to both myself and Eden Lathe, RN.   Next A1C  anticipated June 2017.    Written patient instructions provided, including information on the plate method and dietary planning.  Total time in face to face counseling 20 minutes.   Follow up in Pharmacist Clinic Visit PRN. Next visit with Dr. Jarold Song.

## 2015-10-13 ENCOUNTER — Telehealth: Payer: Self-pay

## 2015-10-13 NOTE — Telephone Encounter (Signed)
This Case Manager placed call to patient to ensure patient understood plan (plan discussed with patient on 10/10/15 by Nicoletta Ba, [place] and Eden Lathe, RN)  for eating/checking blood glucose once fasting for Ramadan begins. Patient indicated once Ramadan starts he will be checking his blood glucose before eating and then giving himself Novolog after eating. Informed patient he is to check blood glucose BEFORE eating a meal and to take Novolog (sliding scale) BEFORE eating to avoid hypoglycemia. Patient verbalized understanding but again incorrectly stated he would give Novolog after eating.  Repeated instructions again x 2 and then asked patient to do teach back. Patient was then able to correctly state twice that he will be checking blood glucose prior to eating a meal and administering sliding scale Novolog before eating. Patient appreciative of call. Patient reminded to call clinic with any questions.

## 2015-10-17 ENCOUNTER — Telehealth: Payer: Self-pay

## 2015-10-17 NOTE — Telephone Encounter (Signed)
Call placed to the patient to check on his blood sugars and insulin administration. He stated that he is still fasting during the day. He confirmed that he is only  checking his blood sugars twice a day  - in the morning before he eats and in the evening before he eats. He said that his blood sugars have been between 100's - 300 and he noted that he is following his sliding scale. He said that he is " always following the chart."  He stated that if he needs insulin according to the chart  ( sliding scale ) he gives the insulin before he eats.   He said that he is " not bad" an had no problems/ questions at this time.

## 2015-10-18 ENCOUNTER — Telehealth: Payer: Self-pay

## 2015-10-18 NOTE — Telephone Encounter (Signed)
Call placed to the patient to remind him of his appointment at the [facility] at the [facility] tomorrow, 10/19/15 @ 0945.  He confirmed that he would be there and noted that he has transportation to the clinic. Instructed him to bring his blood sugar log to his appointment tomorrow and he stated that he would. He reported that his blood sugar this morning was 159 and stated that he took 2 units of the novolog at that time. He also reported that his blood sugar last night was 413 and he said that he took the amount of insulin that was on his chart. He did not state exactly how much insulin he took.  He said that he is feeling " good " and did not report any problems/concerns at this time.

## 2015-10-19 ENCOUNTER — Ambulatory Visit: Payer: Medicaid Other | Attending: Family Medicine | Admitting: Family Medicine

## 2015-10-19 ENCOUNTER — Encounter: Payer: Self-pay | Admitting: Family Medicine

## 2015-10-19 VITALS — BP 166/80 | HR 55 | Temp 98.2°F | Ht 71.0 in | Wt 172.8 lb

## 2015-10-19 DIAGNOSIS — E1122 Type 2 diabetes mellitus with diabetic chronic kidney disease: Secondary | ICD-10-CM | POA: Diagnosis not present

## 2015-10-19 DIAGNOSIS — Z87891 Personal history of nicotine dependence: Secondary | ICD-10-CM | POA: Diagnosis not present

## 2015-10-19 DIAGNOSIS — Z7982 Long term (current) use of aspirin: Secondary | ICD-10-CM | POA: Diagnosis not present

## 2015-10-19 DIAGNOSIS — Z794 Long term (current) use of insulin: Secondary | ICD-10-CM | POA: Insufficient documentation

## 2015-10-19 DIAGNOSIS — E118 Type 2 diabetes mellitus with unspecified complications: Secondary | ICD-10-CM

## 2015-10-19 DIAGNOSIS — Z79899 Other long term (current) drug therapy: Secondary | ICD-10-CM | POA: Insufficient documentation

## 2015-10-19 DIAGNOSIS — E119 Type 2 diabetes mellitus without complications: Secondary | ICD-10-CM

## 2015-10-19 LAB — POCT GLYCOSYLATED HEMOGLOBIN (HGB A1C): Hemoglobin A1C: 10.1

## 2015-10-19 LAB — GLUCOSE, POCT (MANUAL RESULT ENTRY): POC Glucose: 191 mg/dl — AB (ref 70–99)

## 2015-10-19 MED ORDER — GLUCOSE BLOOD VI STRP: ORAL_STRIP | Status: DC

## 2015-10-19 MED ORDER — TRUEPLUS LANCETS 28G MISC: 1.0000 | Freq: Three times a day (TID) | Status: DC

## 2015-10-19 MED ORDER — INSULIN GLARGINE 100 UNIT/ML ~~LOC~~ SOLN: 40.0000 [IU] | Freq: Every day | SUBCUTANEOUS | Status: DC

## 2015-10-19 MED FILL — TRUE METRIX TEST STRIP: 25 days supply | Qty: 100 | Fill #1

## 2015-10-19 NOTE — Progress Notes (Signed)
Numbness in all fingers x1 wk.  Took all medications today, refill medications and test strips

## 2015-10-19 NOTE — Progress Notes (Signed)
Subjective:    Patient ID: Russell Price, male    DOB: 11/03/1960, 55 y.o.   MRN: [id number]  HPI He is a 55 year old male with a history of type 2 diabetes mellitus (A1c 10.9), hypertension, stage IV chronic kidney disease who comes in for a  follow-up visit.  He is currently on Lantus 35 units at bedtime and NovoLog sliding scale; his blood sugar logs reveal blood sugars ranging from 150-350 but these are usually taken about 2 hours after his meals and then he administers the NovoLog according to his sliding scale after his meals. He is currently undergoing the Ramadan fast and so eats between 4 and 5 AM and breaks his fast around 8 PM. Denies hypoglycemia  Placed on Neurontin at his last visit but continues to have continuous numbness in the tips of his fingers.  Past Medical History  Diagnosis Date  . Diabetes mellitus without complication ([place])     followed by Dr Chalmers Cater  . Hypertension     followed by [facility]  . Chronic kidney disease     Past Surgical History  Procedure Laterality Date  . Bronchoscopy  02/14/2015    for pulm hemorrhage    Social History   Social History  . Marital Status: Single    Spouse Name: N/A  . Number of Children: N/A  . Years of Education: N/A   Occupational History  . Not on file.   Social History Main Topics  . Smoking status: Former Smoker -- 0.00 packs/day for 4 years    Quit date: 02/23/1983  . Smokeless tobacco: Not on file  . Alcohol Use: No  . Drug Use: No  . Sexual Activity: Not on file   Other Topics Concern  . Not on file   Social History Narrative    No Known Allergies  Current Outpatient Prescriptions on File Prior to Visit  Medication Sig Dispense Refill  . amLODipine (NORVASC) 10 MG tablet Take 1 tablet (10 mg total) by mouth daily. Stop lisinopril . 30 tablet 11  . aspirin 81 MG chewable tablet Chew 1 tablet (81 mg total) by mouth daily.    Marland Kitchen atorvastatin (LIPITOR) 40 MG tablet Take 1 tablet  (40 mg total) by mouth daily at 6 PM. 30 tablet 11  . carvedilol (COREG) 6.25 MG tablet Take 1 tablet (6.25 mg total) by mouth 2 (two) times daily with a meal. 90 tablet 11  . chlorthalidone (HYGROTON) 25 MG tablet Take 1 tablet (25 mg total) by mouth daily. 30 tablet 3  . gabapentin (NEURONTIN) 300 MG capsule Take 1 capsule (300 mg total) by mouth 2 (two) times daily. 60 capsule 3  . insulin aspart (NOVOLOG) 100 UNIT/ML injection Inject 0-12 Units into the skin 3 (three) times daily before meals. 3 vial 3   No current facility-administered medications on file prior to visit.      Review of Systems Constitutional: Negative for activity change and appetite change.  HENT: Negative for sinus pressure and sore throat.   Eyes: Negative for visual disturbance.  Respiratory: Negative for cough, chest tightness and shortness of breath.   Cardiovascular: Negative for chest pain and leg swelling.  Gastrointestinal: Negative for abdominal pain, diarrhea, constipation and abdominal distention.  Endocrine: Negative.   Genitourinary: Negative for dysuria.  Musculoskeletal: Negative for myalgias and joint swelling.  Skin: Negative for rash.  Allergic/Immunologic: Negative.   Neurological: Positive for numbness. Negative for weakness and light-headedness.    Objective: Filed Vitals:  10/19/15 0957  BP: 166/80  Pulse: 55  Temp: 98.2 F (36.8 C)  TempSrc: Oral  Height: 5\' 11"  (1.803 m)  Weight: 172 lb 12.8 oz (78.382 kg)  SpO2: 100%      Physical Exam Constitutional: He is oriented to person, place, and time. He appears well-developed and well-nourished.  Cardiovascular: Normal rate, normal heart sounds and intact distal pulses.   No murmur heard. Pulmonary/Chest: Effort normal and breath sounds normal. He has no wheezes. He has no rales. He exhibits no tenderness.  Abdominal: Soft. Bowel sounds are normal. He exhibits no distension and no mass. There is no tenderness.  Musculoskeletal:  Normal range of motion.  Neurological: He is alert and oriented to person, place, and time.  Skin: Skin is warm and dry.        Assessment & Plan:  1. Diabetes mellitus type 2 in nonobese (HCC) Uncontrolled with A1c of 10.9 with Blood sugars varying from 130s to 350s  Increase Lantus to 40 units daily at bedtime and continue NovoLog sliding scale which he has been advised to take twice daily prior to his morning meal at 4 AM and before his evening meal at 8 PM when he breaks his fast. We'll repeat blood sugar log at next visit  2. Other diabetic neurological complication associated with type 2 diabetes mellitus ([place]) Placed on Neurontin at his last visit for symptoms that uncontrolled Advised to take both 300 mg Neurontin tablets in the evening due to somnolence If symptoms persist I will raise his dose at his next visit  This note has been created with Dragon speech recognition software and Engineer, materials. Any transcriptional errors are unintentional.

## 2015-10-19 NOTE — Patient Instructions (Signed)
Diabetes Mellitus and Food It is important for you to manage your blood sugar (glucose) level. Your blood glucose level can be greatly affected by what you eat. Eating healthier foods in the appropriate amounts throughout the day at about the same time each day will help you control your blood glucose level. It can also help slow or prevent worsening of your diabetes mellitus. Healthy eating may even help you improve the level of your blood pressure and reach or maintain a healthy weight.  General recommendations for healthful eating and cooking habits include:  Eating meals and snacks regularly. Avoid going long periods of time without eating to lose weight.  Eating a diet that consists mainly of plant-based foods, such as fruits, vegetables, nuts, legumes, and whole grains.  Using low-heat cooking methods, such as baking, instead of high-heat cooking methods, such as deep frying. Work with your dietitian to make sure you understand how to use the Nutrition Facts information on food labels. HOW CAN FOOD AFFECT ME? Carbohydrates Carbohydrates affect your blood glucose level more than any other type of food. Your dietitian will help you determine how many carbohydrates to eat at each meal and teach you how to count carbohydrates. Counting carbohydrates is important to keep your blood glucose at a healthy level, especially if you are using insulin or taking certain medicines for diabetes mellitus. Alcohol Alcohol can cause sudden decreases in blood glucose (hypoglycemia), especially if you use insulin or take certain medicines for diabetes mellitus. Hypoglycemia can be a life-threatening condition. Symptoms of hypoglycemia (sleepiness, dizziness, and disorientation) are similar to symptoms of having too much alcohol.  If your health care provider has given you approval to drink alcohol, do so in moderation and use the following guidelines:  Women should not have more than one drink per day, and men  should not have more than two drinks per day. One drink is equal to:  12 oz of beer.  5 oz of wine.  1 oz of hard liquor.  Do not drink on an empty stomach.  Keep yourself hydrated. Have water, diet soda, or unsweetened iced tea.  Regular soda, juice, and other mixers might contain a lot of carbohydrates and should be counted. WHAT FOODS ARE NOT RECOMMENDED? As you make food choices, it is important to remember that all foods are not the same. Some foods have fewer nutrients per serving than other foods, even though they might have the same number of calories or carbohydrates. It is difficult to get your body what it needs when you eat foods with fewer nutrients. Examples of foods that you should avoid that are high in calories and carbohydrates but low in nutrients include:  Trans fats (most processed foods list trans fats on the Nutrition Facts label).  Regular soda.  Juice.  Candy.  Sweets, such as cake, pie, doughnuts, and cookies.  Fried foods. WHAT FOODS CAN I EAT? Eat nutrient-rich foods, which will nourish your body and keep you healthy. The food you should eat also will depend on several factors, including:  The calories you need.  The medicines you take.  Your weight.  Your blood glucose level.  Your blood pressure level.  Your cholesterol level. You should eat a variety of foods, including:  Protein.  Lean cuts of meat.  Proteins low in saturated fats, such as fish, egg whites, and beans. Avoid processed meats.  Fruits and vegetables.  Fruits and vegetables that may help control blood glucose levels, such as apples, mangoes, and   yams.  Dairy products.  Choose fat-free or low-fat dairy products, such as milk, yogurt, and cheese.  Grains, bread, pasta, and rice.  Choose whole grain products, such as multigrain bread, whole oats, and brown rice. These foods may help control blood pressure.  Fats.  Foods containing healthful fats, such as nuts,  avocado, olive oil, canola oil, and fish. DOES EVERYONE WITH DIABETES MELLITUS HAVE THE SAME MEAL PLAN? Because every person with diabetes mellitus is different, there is not one meal plan that works for everyone. It is very important that you meet with a dietitian who will help you create a meal plan that is just right for you.   This information is not intended to replace advice given to you by your health care provider. Make sure you discuss any questions you have with your health care provider.   Document Released: 01/31/2005 Document Revised: 05/27/2014 Document Reviewed: 04/02/2013 Elsevier Interactive Patient Education 2016 Elsevier Inc.  

## 2015-10-25 ENCOUNTER — Telehealth: Payer: Self-pay

## 2015-10-25 NOTE — Telephone Encounter (Signed)
This Case Manager placed call to patient to check on status and to remind him of upcoming appointment on 10/26/15 at [place] with Dr. Jarold Song. Patient aware of appointment and indicated he had transportation to his upcoming appointment. Inquired if checking blood glucose prior to eating and patient indicated he was. Patient indicated his blood glucose ranging from 91-400. Inquired if patient administering Lantus 40 U at bedtime. Patient indicated he was administering 35 U of Lantus at bedtime. Informed patient that Dr. Jarold Song increased his Lantus at his last appointment to 40 U. Patient indicated he was unaware but would start administering Lantus 40 U as prescribed by Dr. Jarold Song. Patient also indicated he was checking blood glucose prior to eating at 0400 and 2000 and administering Novolog per sliding scale prior to eating. Informed patient to bring blood glucose log to appointment on 10/26/15 so Dr. Jarold Song could review. In addition, reviewed signs/symptoms of hypoglycemia and patient able to state how he would manage hypoglycemia. No additional needs/concerns identified. Will route to Dr. Jarold Song for her review.

## 2015-10-26 ENCOUNTER — Encounter: Payer: Self-pay | Admitting: Family Medicine

## 2015-10-26 ENCOUNTER — Ambulatory Visit: Payer: Medicaid Other | Attending: Family Medicine | Admitting: Family Medicine

## 2015-10-26 DIAGNOSIS — E1122 Type 2 diabetes mellitus with diabetic chronic kidney disease: Secondary | ICD-10-CM | POA: Insufficient documentation

## 2015-10-26 DIAGNOSIS — H5440 Blindness, one eye, unspecified eye: Secondary | ICD-10-CM

## 2015-10-26 DIAGNOSIS — N184 Chronic kidney disease, stage 4 (severe): Secondary | ICD-10-CM

## 2015-10-26 DIAGNOSIS — H5441 Blindness, right eye, normal vision left eye: Secondary | ICD-10-CM

## 2015-10-26 DIAGNOSIS — Z79899 Other long term (current) drug therapy: Secondary | ICD-10-CM | POA: Insufficient documentation

## 2015-10-26 DIAGNOSIS — E785 Hyperlipidemia, unspecified: Secondary | ICD-10-CM

## 2015-10-26 DIAGNOSIS — Z794 Long term (current) use of insulin: Secondary | ICD-10-CM | POA: Diagnosis not present

## 2015-10-26 DIAGNOSIS — E1149 Type 2 diabetes mellitus with other diabetic neurological complication: Secondary | ICD-10-CM

## 2015-10-26 DIAGNOSIS — Z7982 Long term (current) use of aspirin: Secondary | ICD-10-CM | POA: Diagnosis not present

## 2015-10-26 DIAGNOSIS — E118 Type 2 diabetes mellitus with unspecified complications: Secondary | ICD-10-CM

## 2015-10-26 DIAGNOSIS — I10 Essential (primary) hypertension: Secondary | ICD-10-CM

## 2015-10-26 LAB — CBC
HEMATOCRIT: 26.7 % — AB (ref 38.5–50.0)
HEMOGLOBIN: 8.7 g/dL — AB (ref 13.2–17.1)
MCH: 27.5 pg (ref 27.0–33.0)
MCHC: 32.6 g/dL (ref 32.0–36.0)
MCV: 84.5 fL (ref 80.0–100.0)
MPV: 12.8 fL — ABNORMAL HIGH (ref 7.5–12.5)
Platelets: 124 10*3/uL — ABNORMAL LOW (ref 140–400)
RBC: 3.16 MIL/uL — ABNORMAL LOW (ref 4.20–5.80)
RDW: 15.3 % — ABNORMAL HIGH (ref 11.0–15.0)
WBC: 4.6 10*3/uL (ref 3.8–10.8)

## 2015-10-26 LAB — RENAL FUNCTION PANEL
ALBUMIN: 3.6 g/dL (ref 3.6–5.1)
BUN: 91 mg/dL — AB (ref 7–25)
CALCIUM: 8.3 mg/dL — AB (ref 8.6–10.3)
CO2: 17 mmol/L — ABNORMAL LOW (ref 20–31)
CREATININE: 4.2 mg/dL — AB (ref 0.70–1.33)
Chloride: 113 mmol/L — ABNORMAL HIGH (ref 98–110)
GLUCOSE: 88 mg/dL (ref 65–99)
PHOSPHORUS: 4.1 mg/dL (ref 2.5–4.5)
Potassium: 4.8 mmol/L (ref 3.5–5.3)
Sodium: 141 mmol/L (ref 135–146)

## 2015-10-26 LAB — GLUCOSE, POCT (MANUAL RESULT ENTRY): POC Glucose: 148 mg/dl — AB (ref 70–99)

## 2015-10-26 MED ORDER — CARVEDILOL 6.25 MG PO TABS: 6.2500 mg | ORAL_TABLET | Freq: Two times a day (BID) | ORAL | Status: DC

## 2015-10-26 MED ORDER — ATORVASTATIN CALCIUM 40 MG PO TABS: 40.0000 mg | ORAL_TABLET | Freq: Every day | ORAL | Status: DC

## 2015-10-26 MED ORDER — NIFEDIPINE ER 60 MG PO TB24: 60.0000 mg | ORAL_TABLET | Freq: Every day | ORAL | Status: DC

## 2015-10-26 MED ORDER — CHLORTHALIDONE 25 MG PO TABS: 25.0000 mg | ORAL_TABLET | Freq: Every day | ORAL | Status: DC

## 2015-10-26 MED ORDER — GABAPENTIN 300 MG PO CAPS: 300.0000 mg | ORAL_CAPSULE | Freq: Two times a day (BID) | ORAL | Status: DC

## 2015-10-26 MED FILL — GABAPENTIN 300 MG CAPSULE: 300 | 30 days supply | Qty: 60 | Fill #0

## 2015-10-26 MED FILL — CARVEDILOL 6.25 MG TABLET: 6.25 | 30 days supply | Qty: 60 | Fill #0

## 2015-10-26 MED FILL — CHLORTHALIDONE 25 MG TABLET: 25 | 30 days supply | Qty: 30 | Fill #0

## 2015-10-26 MED FILL — ATORVASTATIN 40 MG TABLET: 40 | 30 days supply | Qty: 30 | Fill #0

## 2015-10-26 MED FILL — ?NIFEDIPINE ER 60 MG TABLET: 60 | 30 days supply | Qty: 30 | Fill #0

## 2015-10-26 NOTE — Progress Notes (Signed)
Subjective:  Patient ID: Russell Price, male    DOB: 1961/01/10  Age: 55 y.o. MRN: [id number]  CC: Follow-up   HPI Russell Price is a 55 year old male with a past medical history significant for type 2 diabetes mellitus (A1c 10.1), hypertension, and stage IV chronic kidney disease who presents today for follow-up visit. Patient brought blood sugar logs today. Morning range was from 91-268, afternoon range was from 80-331, and evening range was from 194-340. Patient was taking 35 units instead of 40 units until yesterday when the nurse called. He took 40 units of Lantus yesterday.  He is taking NovoLog according to his sliding scale. Patient reports that he has been taking Neurontin and it has helped with his numbness in his fingers. Patient request refills on all medications. Patient denies SOB, N/V, chest pain, abdominal pain, hypoglycemia, and excessive thirst.  Patients blood pressure was elevated at today's visit (166/75). Manual blood pressure was taken after 15 minutes of sitting and was 160/70. Patient reports that he has been taking all his blood pressure medication. Patient denies headaches and dizziness.  Outpatient Prescriptions Prior to Visit  Medication Sig Dispense Refill  . aspirin 81 MG chewable tablet Chew 1 tablet (81 mg total) by mouth daily.    Marland Kitchen glucose blood (TRUE METRIX BLOOD GLUCOSE TEST) test strip Use as directed 3 times daily before meals 100 each 12  . insulin aspart (NOVOLOG) 100 UNIT/ML injection Inject 0-12 Units into the skin 3 (three) times daily before meals. 3 vial 3  . insulin glargine (LANTUS) 100 UNIT/ML injection Inject 0.4 mLs (40 Units total) into the skin daily. 10 mL 3  . TRUEPLUS LANCETS 28G MISC 1 each by Does not apply route 3 (three) times daily before meals. 100 each 12  . amLODipine (NORVASC) 10 MG tablet Take 1 tablet (10 mg total) by mouth daily. Stop lisinopril . 30 tablet 11  . atorvastatin (LIPITOR) 40 MG tablet Take 1 tablet (40 mg  total) by mouth daily at 6 PM. 30 tablet 11  . carvedilol (COREG) 6.25 MG tablet Take 1 tablet (6.25 mg total) by mouth 2 (two) times daily with a meal. 90 tablet 11  . chlorthalidone (HYGROTON) 25 MG tablet Take 1 tablet (25 mg total) by mouth daily. 30 tablet 3  . gabapentin (NEURONTIN) 300 MG capsule Take 1 capsule (300 mg total) by mouth 2 (two) times daily. (Patient not taking: Reported on 10/26/2015) 60 capsule 3   No facility-administered medications prior to visit.    ROS Review of Systems  Constitutional: Negative for fever, chills, fatigue and unexpected weight change.  Respiratory: Negative for chest tightness and shortness of breath.   Cardiovascular: Negative for chest pain and leg swelling.  Gastrointestinal: Negative for nausea, vomiting and abdominal pain.  Endocrine: Negative for polydipsia and polyuria.  Skin: Negative for rash.  Neurological: Positive for numbness. Negative for dizziness, tremors, weakness and headaches.  Psychiatric/Behavioral: Negative for suicidal ideas, sleep disturbance and self-injury. The patient is not nervous/anxious.     Objective:  BP 166/75 mmHg  Pulse 53  Temp(Src) 98.1 F (36.7 C) (Oral)  Wt 172 lb (78.019 kg)  BP/Weight 10/26/2015 10/19/2015 [id number]  Systolic BP XX123456 XX123456 XX123456  Diastolic BP 75 80 60  Wt. (Lbs) 172 172.8 159.2  BMI 24 24.11 22.84    Diabetes Data   Diabetic Labs Latest Ref Rng 10/19/2015 10/09/2015 09/21/2015  HbA1c - 10.1 - -  Microalbumin Not estab mg/dL - 36.1  102.8  Micro/Creat Ratio <30 mcg/mg creat - 602(H) 643(H)  Chol 125 - 200 mg/dL - 92(L) -  HDL >=40 mg/dL - 38(L) -  Calc LDL <130 mg/dL - 44 -  Triglycerides <150 mg/dL - 49 -  Creatinine 0.70 - 1.33 mg/dL - - 3.82(H)   No flowsheet data found.  Lab Results  Component Value Date   POCGLU 148* 10/26/2015    Diabetes Health Maintenance Due  Topic Date Due  . FOOT EXAM  08/18/1968  . OPHTHALMOLOGY EXAM  08/18/1968  . HEMOGLOBIN A1C  04/19/2016  .  URINE MICROALBUMIN  10/08/2016   Pneumococcal Immunization Status  Topic Date Due  . PNEUMOCOCCAL POLYSACCHARIDE VACCINE (1) 08/18/1960     Physical Exam  Constitutional: He is oriented to person, place, and time. He appears well-developed and well-nourished.  HENT:  Head: Normocephalic and atraumatic.  Cardiovascular: Regular rhythm and intact distal pulses.  Exam reveals no gallop and no friction rub.   No murmur heard. Bradycardic  Pulmonary/Chest: Effort normal and breath sounds normal. He has no wheezes. He has no rales. He exhibits no tenderness.  Abdominal: Soft. Bowel sounds are normal. He exhibits no distension. There is no tenderness. There is no rebound and no guarding.  Musculoskeletal: Normal range of motion. He exhibits no edema or tenderness.  Neurological: He is alert and oriented to person, place, and time.  Skin: Skin is warm and dry. No rash noted.  Psychiatric: He has a normal mood and affect. His behavior is normal. Judgment and thought content normal.     Assessment & Plan:   1. Blind right eye Patient requested opthalmology referral for his yearly eye exam. Referral placed. - Ambulatory referral to Ophthalmology  2. DM (diabetes mellitus) with complications ([place]) Patient brought logs in today. Morning range was from 91-268, afternoon range was from 80-331, and evening range was from 194-340. Patient reports that he is not fasting. Nurse reports that he was taking 35 units instead of 40 of Lantus until yesterday. CBG was 148 today. Lantus kept on 40 units. Patient will continue to log blood sugars and will follow up in 2 weeks. - POCT glucose (manual entry) - atorvastatin (LIPITOR) 40 MG tablet; Take 1 tablet (40 mg total) by mouth daily at 6 PM.  Dispense: 30 tablet; Refill: 3 - Ambulatory referral to Ophthalmology  3. CKD (chronic kidney disease) stage 4, GFR 15-29 ml/min [facility]) Nephrologist requested that kidney function labs. RFP, PTH, CBC, and Vitamin D  ordered. Patient will continue to follow up with Nephrologist.  - Renal Function Panel - PTH, Intact and Calcium - VITAMIN D 25 Hydroxy (Vit-D Deficiency, Fractures) - CBC  4. Essential hypertension Patient reports he has been compliant with his BP medication. BP has been elevated the last two visits (166/80 - 10/19/15 & 166/75 - 10/26/15). Discontinued Amlodipine. Started Nifedipine at 60 mg. - NIFEdipine (PROCARDIA-XL/ADALAT CC) 60 MG 24 hr tablet; Take 1 tablet (60 mg total) by mouth daily.  Dispense: 30 tablet; Refill: 3 - carvedilol (COREG) 6.25 MG tablet; Take 1 tablet (6.25 mg total) by mouth 2 (two) times daily with a meal.  Dispense: 60 tablet; Refill: 3 - chlorthalidone (HYGROTON) 25 MG tablet; Take 1 tablet (25 mg total) by mouth daily.  Dispense: 30 tablet; Refill: 3  5. Hyperlipidemia Patient reports that he has been compliant with medication.Will continue current regimen. Refilled medication. - atorvastatin (LIPITOR) 40 MG tablet; Take 1 tablet (40 mg total) by mouth daily at 6 PM.  Dispense: 30 tablet; Refill: 3  6. Other diabetic neurological complication associated with type 2 diabetes mellitus ([place]) Patient reported that he has been taking his gabapentin and the periods of numbness in his fingers have decreased. Will continue current regimen. - gabapentin (NEURONTIN) 300 MG capsule; Take 1 capsule (300 mg total) by mouth 2 (two) times daily.  Dispense: 60 capsule; Refill: 3   Meds ordered this encounter  Medications  . NIFEdipine (PROCARDIA-XL/ADALAT CC) 60 MG 24 hr tablet    Sig: Take 1 tablet (60 mg total) by mouth daily.    Dispense:  30 tablet    Refill:  3    Discontinue amlodipine  . carvedilol (COREG) 6.25 MG tablet    Sig: Take 1 tablet (6.25 mg total) by mouth 2 (two) times daily with a meal.    Dispense:  60 tablet    Refill:  3  . atorvastatin (LIPITOR) 40 MG tablet    Sig: Take 1 tablet (40 mg total) by mouth daily at 6 PM.    Dispense:  30 tablet     Refill:  3  . gabapentin (NEURONTIN) 300 MG capsule    Sig: Take 1 capsule (300 mg total) by mouth 2 (two) times daily.    Dispense:  60 capsule    Refill:  3  . chlorthalidone (HYGROTON) 25 MG tablet    Sig: Take 1 tablet (25 mg total) by mouth daily.    Dispense:  30 tablet    Refill:  3    Follow-up: Return in about 2 weeks (around 11/09/2015) for Follow-up on diabetes mellitus.   [employer] Student

## 2015-10-26 NOTE — Patient Instructions (Signed)
Diabetes Mellitus and Food It is important for you to manage your blood sugar (glucose) level. Your blood glucose level can be greatly affected by what you eat. Eating healthier foods in the appropriate amounts throughout the day at about the same time each day will help you control your blood glucose level. It can also help slow or prevent worsening of your diabetes mellitus. Healthy eating may even help you improve the level of your blood pressure and reach or maintain a healthy weight.  General recommendations for healthful eating and cooking habits include:  Eating meals and snacks regularly. Avoid going long periods of time without eating to lose weight.  Eating a diet that consists mainly of plant-based foods, such as fruits, vegetables, nuts, legumes, and whole grains.  Using low-heat cooking methods, such as baking, instead of high-heat cooking methods, such as deep frying. Work with your dietitian to make sure you understand how to use the Nutrition Facts information on food labels. HOW CAN FOOD AFFECT ME? Carbohydrates Carbohydrates affect your blood glucose level more than any other type of food. Your dietitian will help you determine how many carbohydrates to eat at each meal and teach you how to count carbohydrates. Counting carbohydrates is important to keep your blood glucose at a healthy level, especially if you are using insulin or taking certain medicines for diabetes mellitus. Alcohol Alcohol can cause sudden decreases in blood glucose (hypoglycemia), especially if you use insulin or take certain medicines for diabetes mellitus. Hypoglycemia can be a life-threatening condition. Symptoms of hypoglycemia (sleepiness, dizziness, and disorientation) are similar to symptoms of having too much alcohol.  If your health care provider has given you approval to drink alcohol, do so in moderation and use the following guidelines:  Women should not have more than one drink per day, and men  should not have more than two drinks per day. One drink is equal to:  12 oz of beer.  5 oz of wine.  1 oz of hard liquor.  Do not drink on an empty stomach.  Keep yourself hydrated. Have water, diet soda, or unsweetened iced tea.  Regular soda, juice, and other mixers might contain a lot of carbohydrates and should be counted. WHAT FOODS ARE NOT RECOMMENDED? As you make food choices, it is important to remember that all foods are not the same. Some foods have fewer nutrients per serving than other foods, even though they might have the same number of calories or carbohydrates. It is difficult to get your body what it needs when you eat foods with fewer nutrients. Examples of foods that you should avoid that are high in calories and carbohydrates but low in nutrients include:  Trans fats (most processed foods list trans fats on the Nutrition Facts label).  Regular soda.  Juice.  Candy.  Sweets, such as cake, pie, doughnuts, and cookies.  Fried foods. WHAT FOODS CAN I EAT? Eat nutrient-rich foods, which will nourish your body and keep you healthy. The food you should eat also will depend on several factors, including:  The calories you need.  The medicines you take.  Your weight.  Your blood glucose level.  Your blood pressure level.  Your cholesterol level. You should eat a variety of foods, including:  Protein.  Lean cuts of meat.  Proteins low in saturated fats, such as fish, egg whites, and beans. Avoid processed meats.  Fruits and vegetables.  Fruits and vegetables that may help control blood glucose levels, such as apples, mangoes, and   yams.  Dairy products.  Choose fat-free or low-fat dairy products, such as milk, yogurt, and cheese.  Grains, bread, pasta, and rice.  Choose whole grain products, such as multigrain bread, whole oats, and brown rice. These foods may help control blood pressure.  Fats.  Foods containing healthful fats, such as nuts,  avocado, olive oil, canola oil, and fish. DOES EVERYONE WITH DIABETES MELLITUS HAVE THE SAME MEAL PLAN? Because every person with diabetes mellitus is different, there is not one meal plan that works for everyone. It is very important that you meet with a dietitian who will help you create a meal plan that is just right for you.   This information is not intended to replace advice given to you by your health care provider. Make sure you discuss any questions you have with your health care provider.   Document Released: 01/31/2005 Document Revised: 05/27/2014 Document Reviewed: 04/02/2013 Elsevier Interactive Patient Education 2016 Elsevier Inc.  

## 2015-10-27 LAB — PTH, INTACT AND CALCIUM
Calcium: 8.4 mg/dL (ref 8.4–10.5)
PTH: 164 pg/mL — ABNORMAL HIGH (ref 14–64)

## 2015-10-27 LAB — VITAMIN D 25 HYDROXY (VIT D DEFICIENCY, FRACTURES): Vit D, 25-Hydroxy: 23 ng/mL — ABNORMAL LOW (ref 30–100)

## 2015-10-30 ENCOUNTER — Telehealth: Payer: Self-pay

## 2015-10-30 MED FILL — TRUE METRIX TEST STRIP: 25 days supply | Qty: 100 | Fill #2

## 2015-10-30 NOTE — Telephone Encounter (Signed)
Writer called patient per Dr. Jarold Song to inform him of his lab results: Notes Recorded by Arnoldo Morale, MD on 10/27/2015 at 4:31 PM Please fax results to [facility]- Dr Moshe Cipro [phone] and also inform the patient his labs revealed decline in his kidney function, anemia, low vitamin D in keeping with his chronic kidney disease; this will be followed by his Nephrologist. Patient stated understanding and has an appt set up with Dr. Moshe Cipro in two months. Labs also faxed to [facility].

## 2015-11-06 ENCOUNTER — Ambulatory Visit: Payer: Self-pay | Attending: Family Medicine

## 2015-11-06 ENCOUNTER — Telehealth (HOSPITAL_COMMUNITY): Payer: Self-pay

## 2015-11-06 NOTE — Progress Notes (Signed)
Was contacted last week by pt asking for assistance with getting an eye exam set up as this was being requested by [employer].  I left a message for SW at [facility] asking for guidance on this.  Instructed that pt would need to contact Carrolyn Leigh at [place] to complete financial screening in order to get coverage through [facility] [employer].  Gave Ms. Amedeo Plenty contact information to pt [phone]) and he is to follow up with her.  Demarquez Ciolek, LCSW

## 2015-11-08 ENCOUNTER — Telehealth: Payer: Self-pay

## 2015-11-08 NOTE — Telephone Encounter (Signed)
This Case Manager placed call to patient to check on status and to remind him of appointment on 11/09/15 at 0900 with Dr. Jarold Song. Patient aware of appointment and indicated he had transportation to his appointment. Reminded patient to keep log of blood glucose readings and to bring log to upcoming appointment. Patient verbalized understanding.  Per Lennart Pall, LCSW note, patient to follow-up with Carrolyn Leigh with DSS for financial screening to get coverage through [facility] for eye exam. Inquired if patient had called Carrolyn Leigh with DSS. Patient indicated he met with Carrolyn Leigh and completed application. He indicated he is now on a waiting list for an eye exam but was told he may not get the exam until the end of the year. Inquired if patient has followed up on status of Medicaid application. Patient indicated he knows application is pending but does not have additional information on status.  Also inquired about follow-up with [facility]. He indicated he has a Nephrology follow-up appointment on 12/13/15 with Dr. Moshe Cipro and was called by nurse at [facility] him he will need to attend a class to inform of dialysis options. He indicated he was informed he would be mailed a letter with class date.  This Case Manager placed call to DSS to follow-up on status of Medicaid application. Voicemail left for Rojelio Brenner, patient's Case Worker requesting return call.

## 2015-11-09 ENCOUNTER — Ambulatory Visit: Payer: Medicaid Other | Attending: Family Medicine | Admitting: Family Medicine

## 2015-11-09 ENCOUNTER — Encounter: Payer: Self-pay | Admitting: Family Medicine

## 2015-11-09 VITALS — BP 138/60 | HR 58 | Temp 98.3°F | Resp 18 | Ht 71.0 in | Wt 179.6 lb

## 2015-11-09 DIAGNOSIS — E1149 Type 2 diabetes mellitus with other diabetic neurological complication: Secondary | ICD-10-CM

## 2015-11-09 DIAGNOSIS — Z9889 Other specified postprocedural states: Secondary | ICD-10-CM | POA: Diagnosis not present

## 2015-11-09 DIAGNOSIS — N184 Chronic kidney disease, stage 4 (severe): Secondary | ICD-10-CM | POA: Diagnosis not present

## 2015-11-09 DIAGNOSIS — H5441 Blindness, right eye, normal vision left eye: Secondary | ICD-10-CM

## 2015-11-09 DIAGNOSIS — Z7982 Long term (current) use of aspirin: Secondary | ICD-10-CM | POA: Insufficient documentation

## 2015-11-09 DIAGNOSIS — I10 Essential (primary) hypertension: Secondary | ICD-10-CM

## 2015-11-09 DIAGNOSIS — Z79899 Other long term (current) drug therapy: Secondary | ICD-10-CM | POA: Diagnosis not present

## 2015-11-09 DIAGNOSIS — Z794 Long term (current) use of insulin: Secondary | ICD-10-CM | POA: Diagnosis not present

## 2015-11-09 DIAGNOSIS — H5440 Blindness, one eye, unspecified eye: Secondary | ICD-10-CM

## 2015-11-09 DIAGNOSIS — E118 Type 2 diabetes mellitus with unspecified complications: Secondary | ICD-10-CM | POA: Insufficient documentation

## 2015-11-09 LAB — GLUCOSE, POCT (MANUAL RESULT ENTRY): POC GLUCOSE: 124 mg/dL — AB (ref 70–99)

## 2015-11-09 MED ORDER — GLUCOSE BLOOD VI STRP: ORAL_STRIP | Status: DC

## 2015-11-09 MED FILL — TRUE METRIX TEST STRIP: 15 days supply | Qty: 50 | Fill #0

## 2015-11-09 NOTE — Progress Notes (Signed)
Subjective:    Patient ID: Russell Price, male    DOB: May 26, 1960, 55 y.o.   MRN: [id number]  HPI He is a 55 year old male with a history of hypertension, uncontrolled type 2 diabetes mellitus (A1c 10.1 in 6/17), diabetic neuropathy, blind in the right eye, stage IV chronic kidney disease who comes in today for follow-up of his diabetes mellitus.  His Lantus was increased to 40 units at bedtime and he remains on the NovoLog sliding scale. Review of his blood sugar log indicates fasting sugars have been between 77-111 and random sugars between 208 - 256. He denies hypoglycemic episodes.  He complains of persistent left eye blurry vision and has been referred to ophthalmology but is on the waiting list for [facility]. He will need an ophthalmology examination to support his case for disability.  Regarding his chronic kidney disease he is closely followed by chronic kidney and is scheduled to attend dialysis classes.  He is concerned about some puffiness of the fingers of his left hand but he denies any pain and has no periorbital edema or pedal edema or shortness of breath . Past Medical History  Diagnosis Date  . Diabetes mellitus without complication ([place])     followed by Dr Chalmers Cater  . Hypertension     followed by [facility]  . Chronic kidney disease     Past Surgical History  Procedure Laterality Date  . Bronchoscopy  02/14/2015    for pulm hemorrhage    No Known Allergies  Current Outpatient Prescriptions on File Prior to Visit  Medication Sig Dispense Refill  . aspirin 81 MG chewable tablet Chew 1 tablet (81 mg total) by mouth daily.    Marland Kitchen atorvastatin (LIPITOR) 40 MG tablet Take 1 tablet (40 mg total) by mouth daily at 6 PM. 30 tablet 3  . carvedilol (COREG) 6.25 MG tablet Take 1 tablet (6.25 mg total) by mouth 2 (two) times daily with a meal. 60 tablet 3  . chlorthalidone (HYGROTON) 25 MG tablet Take 1 tablet (25 mg total) by mouth daily. 30  tablet 3  . gabapentin (NEURONTIN) 300 MG capsule Take 1 capsule (300 mg total) by mouth 2 (two) times daily. 60 capsule 3  . insulin aspart (NOVOLOG) 100 UNIT/ML injection Inject 0-12 Units into the skin 3 (three) times daily before meals. 3 vial 3  . insulin glargine (LANTUS) 100 UNIT/ML injection Inject 0.4 mLs (40 Units total) into the skin daily. 10 mL 3  . NIFEdipine (PROCARDIA-XL/ADALAT CC) 60 MG 24 hr tablet Take 1 tablet (60 mg total) by mouth daily. 30 tablet 3  . TRUEPLUS LANCETS 28G MISC 1 each by Does not apply route 3 (three) times daily before meals. 100 each 12   No current facility-administered medications on file prior to visit.      Review of Systems  Constitutional: Negative for activity change and appetite change.  HENT: Negative for sinus pressure and sore throat.   Eyes: Positive for visual disturbance.  Respiratory: Negative for cough, chest tightness and shortness of breath.   Cardiovascular: Negative for chest pain and leg swelling.  Gastrointestinal: Negative for abdominal pain, diarrhea, constipation and abdominal distention.  Endocrine: Negative.   Genitourinary: Negative for dysuria.  Musculoskeletal: Positive for joint swelling. Negative for myalgias.  Skin: Negative for rash.  Allergic/Immunologic: Negative.   Neurological: Negative for weakness, light-headedness and numbness.  Psychiatric/Behavioral: Negative for suicidal ideas and dysphoric mood.       Objective: Filed  Vitals:   11/09/15 0904 11/09/15 0930  BP: 165/98 138/60  Pulse: 58   Temp: 98.3 F (36.8 C)   TempSrc: Oral   Resp: 18   Height: 5\' 11"  (1.803 m)   Weight: 179 lb 9.6 oz (81.466 kg)   SpO2: 99%       Physical Exam  Constitutional: He is oriented to person, place, and time. He appears well-developed and well-nourished.  Eyes:  Blind in right eye  Neck: Normal range of motion. Neck supple. No tracheal deviation present.  Cardiovascular: Normal rate, regular rhythm and  normal heart sounds.   No murmur heard. Pulmonary/Chest: Effort normal and breath sounds normal. No respiratory distress. He has no wheezes. He exhibits no tenderness.  Abdominal: Soft. Bowel sounds are normal. He exhibits no mass. There is no tenderness.  Musculoskeletal: Normal range of motion. He exhibits edema (mild edema of the left fingers). He exhibits no tenderness.  Neurological: He is alert and oriented to person, place, and time.  Skin: Skin is warm and dry.  Psychiatric: He has a normal mood and affect.          Assessment & Plan:  1. Diabetes mellitus type 2 in nonobese (HCC) Uncontrolled with A1c of 10.1 Blood sugar log reveals improvement. Continue Lantus 40 units at bedtime and NovoLog sliding scale  Educated about hypoglycemia and he knows to call the clinic. - Glucose (CBG)  2. Essential hypertension Initially elevated but repeat is Controlled - chlorthalidone (HYGROTON) 25 MG tablet; Take 1 tablet (25 mg total) by mouth daily.  Dispense: 30 tablet; Refill: 3  3. CKD (chronic kidney disease) stage 4, GFR 15-29 ml/min (HCC) Currently scheduled to attend dialysis classes Managed by [facility]  4. Other diabetic neurological complication associated with type 2 diabetes mellitus ([place]) Currently on Neurontin. - gabapentin (NEURONTIN) 300 MG capsule; Take 1 capsule (300 mg total) by mouth 2 (two) times daily.  Dispense: 60 capsule; Refill: 3  5. Right eye blindness Patient referred to [facility] blind. Currently on wait list Working with our referral coordinator and case manager to see if he can be seen by a private practice - patient has been informed that coming up with $100 could help him get in.  Advised to elevate left hand due to edema of fingers (unclear etiology); low suspicion for osteoarthritis as he is not in pain. Unable to place on Lasix as this will worsen kidney function.

## 2015-11-09 NOTE — Progress Notes (Signed)
Needs test strips Follow up from hospital- diabetes Fingers swelling 2-3 weeks

## 2015-11-09 NOTE — Progress Notes (Signed)
This Case Manager was asked by Dr. Jarold Song to meet with patient to discuss additional options for obtaining eye exam. Patient has informed this Case Manager he is on a waiting list with [facility] for eye exam, but he was informed he may not get the exam until the end of the year. Discussed with patient an additional option for an eye exam is with [facility] but there will be out of pocket cost for eye exam. Patient informed of cost, and he indicated he would see if he could obtain funds. Informed patient that Case Manager will follow-up with him next week to determine decision. Patient verbalized understanding and appreciative of information.

## 2015-11-13 ENCOUNTER — Telehealth: Payer: Self-pay

## 2015-11-13 MED FILL — TRUE METRIX TEST STRIP: 30 days supply | Qty: 100 | Fill #1

## 2015-11-13 MED FILL — !LANTUS SOLOSTAR 100UNITS/M: 100 | 25 days supply | Qty: 9 | Fill #1

## 2015-11-13 NOTE — Telephone Encounter (Signed)
Call placed to the patient to inquire if he has considered paying $100 for the eye exam for an exam at [facility]. He said that he does not have the money for the exam.  He will remain on the list for an eye exam with the [facility].

## 2015-11-14 ENCOUNTER — Telehealth: Payer: Self-pay

## 2015-11-14 NOTE — Telephone Encounter (Signed)
Call placed to the patient's [place] # [phone] to check on the status of his medicaid application.   Call also placed to Carrolyn Leigh, [place] program # [phone] and was informed that the patient is on the waiting list because he stated that he has food stamps but he may not get an appointment until the end of the year,and it might even be longer.  She spoke about the limitations with the program due to funding cuts. She noted that there are no other options in the area that she is aware of for people without insurance. This program that she coordinates is only ofr eye exams and glasses.

## 2015-11-23 ENCOUNTER — Ambulatory Visit: Payer: Self-pay | Attending: Family Medicine | Admitting: Pharmacist

## 2015-11-23 DIAGNOSIS — E118 Type 2 diabetes mellitus with unspecified complications: Secondary | ICD-10-CM

## 2015-11-23 MED FILL — TRUE METRIX TEST STRIP: 30 days supply | Qty: 100 | Fill #2

## 2015-11-23 NOTE — Patient Instructions (Signed)
Thanks for coming to see me!  Take your blood sugar only 3 times a day now --- Before you eat anything when you first wake up --- Before two of your biggest meals of the day  This will help you to save strips.  Follow up with Dr. Jarold Song  Hypoglycemia Low blood sugar (hypoglycemia) means that the level of sugar in your blood is lower than it should be. Signs of low blood sugar include:  Getting sweaty.  Feeling hungry.  Feeling dizzy or weak.  Feeling sleepier than normal.  Feeling nervous.  Headaches.  Having a fast heartbeat. Low blood sugar can happen fast and can be an emergency. Your doctor can do tests to check your blood sugar level. You can have low blood sugar and not have diabetes. HOME CARE  Check your blood sugar as told by your doctor. If it is less than 70 mg/dl or as told by your doctor, take 1 of the following:  3 to 4 glucose tablets.   cup clear juice.   cup soda pop, not diet.  1 cup milk.  5 to 6 hard candies.  Recheck blood sugar after 15 minutes. Repeat until it is at the right level.  Eat a snack if it is more than 1 hour until the next meal.  Only take medicine as told by your doctor.  Do not skip meals. Eat on time.  Do not drink alcohol except with meals.  Check your blood glucose before driving.  Check your blood glucose before and after exercise.  Always carry treatment with you, such as glucose pills.  Always wear a medical alert bracelet if you have diabetes. GET HELP RIGHT AWAY IF:   Your blood glucose goes below 70 mg/dl or as told by your doctor, and you:  Are confused.  Are not able to swallow.  Pass out (faint).  You cannot treat yourself. You may need someone to help you.  You have low blood sugar problems often.  You have problems from your medicines.  You are not feeling better after 3 to 4 days.  You have vision changes. MAKE SURE YOU:   Understand these instructions.  Will watch this  condition.  Will get help right away if you are not doing well or get worse.   This information is not intended to replace advice given to you by your health care provider. Make sure you discuss any questions you have with your health care provider.   Document Released: 07/31/2009 Document Revised: 05/27/2014 Document Reviewed: 01/10/2015 Elsevier Interactive Patient Education [employer].

## 2015-11-23 NOTE — Progress Notes (Signed)
S:    Patient presents for diabetes education at the request of Dr. Jarold Song. Patient spoke with Russell Price on 10/10/15 and was possibly not using his sliding scale insulin correctly so he is here today to review the sliding scale and blood glucose monitoring.  Patient reports adherence with medications.  Current diabetes medications include: Lantus and Novolog  Patient reports two hypoglycemic events which occurred during Ramadan when he did not eat enough for the amount of insulin he injected. He drank juice to treat the hypoglycemia.   Ramadan is over so patient has been eating throughout the day again.   Patient has been checking his blood glucose before he eats and bases his Novolog dose on that.    O:  Lab Results  Component Value Date   HGBA1C 10.1 10/19/2015   There were no vitals filed for this visit.   Fasting CBGs: 44- 210 Post-prandial/random CBGs: 100s-208  A/P: Diabetes longstanding currently UNcontrolled based on A1c of 10.1 but improving based on home CBGs. Patient reports hypoglycemic events and is able to verbalize appropriate hypoglycemia management plan. Patient reports adherence with medication.   Continue medications as prescribed by Dr. Jarold Song. Reviewed sliding scale with patient and he was able to teach back when he would use certain doses. Also instructed patient to only check his blood glucose 3 times daily to avoid running out of strips too soon. Patient verbalized understanding.   Next A1C anticipated September 2017.    Written patient instructions provided. Total time in face to face counseling 20 minutes. Follow up in Pharmacist Clinic Visit PRN. Next visit with Dr. Jarold Song.

## 2015-12-04 MED FILL — ?NIFEDIPINE ER 60 MG TABLET: 60 | 30 days supply | Qty: 30 | Fill #1

## 2015-12-04 MED FILL — CHLORTHALIDONE 25 MG TABLET: 25 | 30 days supply | Qty: 30 | Fill #1

## 2015-12-04 MED FILL — GABAPENTIN 300 MG CAPSULE: 300 | 30 days supply | Qty: 60 | Fill #1

## 2015-12-06 MED FILL — CARVEDILOL 6.25 MG TABLET: 6.25 | 30 days supply | Qty: 60 | Fill #1

## 2015-12-06 MED FILL — ATORVASTATIN 40 MG TABLET: 40 | 30 days supply | Qty: 30 | Fill #1

## 2015-12-12 MED FILL — RENA-VITE TABLET: 30 days supply | Qty: 30 | Fill #0

## 2015-12-12 MED FILL — !NOVOLOG 100UNITS/ML VIAL: 100/ML | 30 days supply | Qty: 10 | Fill #1

## 2015-12-18 ENCOUNTER — Other Ambulatory Visit: Payer: Self-pay | Admitting: Pharmacist

## 2015-12-18 ENCOUNTER — Other Ambulatory Visit: Payer: Self-pay | Admitting: Family Medicine

## 2015-12-18 MED ORDER — GLUCOSE BLOOD VI STRP: ORAL_STRIP | 12 refills | Status: DC

## 2015-12-18 MED ORDER — ACCU-CHEK SOFTCLIX LANCET DEV MISC: 12 refills | Status: DC

## 2015-12-18 MED ORDER — ACCU-CHEK AVIVA DEVI: 0 refills | Status: DC

## 2015-12-25 MED FILL — ACCU-CHEK SOFTCLIX LANCETS: 30 days supply | Qty: 100 | Fill #0

## 2015-12-25 MED FILL — ACCU-CHEK AVIVA PLUS TEST S: 25 days supply | Qty: 100 | Fill #0

## 2015-12-25 MED FILL — LANTUS SOLOSTAR 100 UNITS/M: 100 | 25 days supply | Qty: 9 | Fill #2

## 2015-12-25 MED FILL — ACCU-CHEK AVIVA PLUS METER: W/DEVICE | 1 days supply | Qty: 1 | Fill #0

## 2016-01-01 ENCOUNTER — Encounter: Payer: Self-pay | Admitting: Family Medicine

## 2016-01-02 MED FILL — ?CHLORTHALIDONE 25 MG TABLE: 25 | 30 days supply | Qty: 30 | Fill #2

## 2016-01-02 MED FILL — ?ATORVASTATIN 40MG TABLET: 40 | 30 days supply | Qty: 30 | Fill #2

## 2016-01-02 MED FILL — GABAPENTIN 300 MG CAPSULE: 300 | 30 days supply | Qty: 60 | Fill #2

## 2016-01-02 MED FILL — CARVEDILOL 6.25 MG TABLET: 6.25 | 30 days supply | Qty: 60 | Fill #2

## 2016-01-02 MED FILL — ?NIFEDIPINE ER 60 MG TABLET: 60 | 30 days supply | Qty: 30 | Fill #2

## 2016-01-02 MED FILL — RENA-VITE TABLET: 30 days supply | Qty: 30 | Fill #1

## 2016-01-18 MED FILL — ACCU-CHEK AVIVA PLUS TEST S: 25 days supply | Qty: 100 | Fill #1

## 2016-01-24 MED FILL — NovoLOG 100 UNIT/ML SOLN: 100 | 30 days supply | Qty: 10 | Fill #2

## 2016-01-24 MED FILL — LANTUS SOLOSTAR 100 UNITS/M: 100 | 25 days supply | Qty: 9 | Fill #3

## 2016-01-29 MED FILL — GABAPENTIN 300 MG CAPSULE: 300 | 30 days supply | Qty: 60 | Fill #3

## 2016-01-29 MED FILL — ?NIFEDIPINE ER 60 MG TABLET: 60 | 30 days supply | Qty: 30 | Fill #3

## 2016-01-31 ENCOUNTER — Other Ambulatory Visit (HOSPITAL_COMMUNITY): Payer: Self-pay | Admitting: *Deleted

## 2016-02-01 ENCOUNTER — Ambulatory Visit (HOSPITAL_COMMUNITY)
Admission: RE | Admit: 2016-02-01 | Discharge: 2016-02-01 | Disposition: A | Discharge status: Home / Self Care | Payer: Medicaid Other | Source: Ambulatory Visit | Attending: Nephrology | Admitting: Nephrology

## 2016-02-01 DIAGNOSIS — D509 Iron deficiency anemia, unspecified: Secondary | ICD-10-CM | POA: Diagnosis present

## 2016-02-01 MED ORDER — SODIUM CHLORIDE 0.9 % IV SOLN
510.0000 mg | INTRAVENOUS | Status: DC
  Administered 2016-02-01: 510 mg via INTRAVENOUS
  Filled 2016-02-01: qty 17

## 2016-02-01 MED ORDER — SODIUM CHLORIDE 0.9 % IV SOLN: INTRAVENOUS | Status: DC

## 2016-02-01 NOTE — Discharge Instructions (Signed)

## 2016-02-06 ENCOUNTER — Other Ambulatory Visit: Payer: Self-pay | Admitting: *Deleted

## 2016-02-06 DIAGNOSIS — N185 Chronic kidney disease, stage 5: Secondary | ICD-10-CM

## 2016-02-06 DIAGNOSIS — Z01810 Encounter for preprocedural cardiovascular examination: Secondary | ICD-10-CM

## 2016-02-07 ENCOUNTER — Ambulatory Visit: Payer: Medicaid Other | Attending: Family Medicine | Admitting: Family Medicine

## 2016-02-07 ENCOUNTER — Encounter: Payer: Self-pay | Admitting: Family Medicine

## 2016-02-07 ENCOUNTER — Telehealth: Payer: Self-pay

## 2016-02-07 VITALS — BP 159/73 | HR 49 | Temp 97.6°F | Wt 174.8 lb

## 2016-02-07 DIAGNOSIS — E785 Hyperlipidemia, unspecified: Secondary | ICD-10-CM

## 2016-02-07 DIAGNOSIS — Z794 Long term (current) use of insulin: Secondary | ICD-10-CM | POA: Diagnosis not present

## 2016-02-07 DIAGNOSIS — I10 Essential (primary) hypertension: Secondary | ICD-10-CM

## 2016-02-07 DIAGNOSIS — N184 Chronic kidney disease, stage 4 (severe): Secondary | ICD-10-CM

## 2016-02-07 DIAGNOSIS — E1122 Type 2 diabetes mellitus with diabetic chronic kidney disease: Secondary | ICD-10-CM | POA: Diagnosis not present

## 2016-02-07 DIAGNOSIS — E1149 Type 2 diabetes mellitus with other diabetic neurological complication: Secondary | ICD-10-CM

## 2016-02-07 DIAGNOSIS — E119 Type 2 diabetes mellitus without complications: Secondary | ICD-10-CM

## 2016-02-07 LAB — POCT GLYCOSYLATED HEMOGLOBIN (HGB A1C): Hemoglobin A1C: 8.5

## 2016-02-07 LAB — GLUCOSE, POCT (MANUAL RESULT ENTRY): POC Glucose: 237 mg/dl — AB (ref 70–99)

## 2016-02-07 MED ORDER — INSULIN ASPART 100 UNIT/ML ~~LOC~~ SOLN: 0.0000 [IU] | Freq: Three times a day (TID) | SUBCUTANEOUS | 5 refills | Status: DC

## 2016-02-07 MED ORDER — NIFEDIPINE ER 90 MG PO TB24: 90.0000 mg | ORAL_TABLET | Freq: Every day | ORAL | 5 refills | Status: DC

## 2016-02-07 MED ORDER — CHLORTHALIDONE 25 MG PO TABS: 25.0000 mg | ORAL_TABLET | Freq: Every day | ORAL | 5 refills | Status: DC

## 2016-02-07 MED ORDER — ATORVASTATIN CALCIUM 40 MG PO TABS
40.0000 mg | ORAL_TABLET | Freq: Every day | ORAL | 5 refills | Status: DC
Start: 2016-02-07 — End: 2016-05-01

## 2016-02-07 MED ORDER — GABAPENTIN 300 MG PO CAPS: 300.0000 mg | ORAL_CAPSULE | Freq: Two times a day (BID) | ORAL | 5 refills | Status: DC

## 2016-02-07 MED ORDER — CARVEDILOL 3.125 MG PO TABS: 3.1250 mg | ORAL_TABLET | Freq: Two times a day (BID) | ORAL | 5 refills | Status: DC

## 2016-02-07 MED ORDER — INSULIN GLARGINE 100 UNIT/ML ~~LOC~~ SOLN: 40.0000 [IU] | Freq: Every day | SUBCUTANEOUS | 5 refills | Status: DC

## 2016-02-07 MED FILL — NIFEDIPINE ER 90 MG TABLET: 90 | 30 days supply | Qty: 30 | Fill #0

## 2016-02-07 MED FILL — NovoLOG 100 UNIT/ML SOLN: 100 | 27 days supply | Qty: 10 | Fill #0

## 2016-02-07 MED FILL — CARVEDILOL 3.125 MG TABLET: 3.125 | 15 days supply | Qty: 30 | Fill #0

## 2016-02-07 MED FILL — CHLORTHALIDONE 25 MG TABLET: 25 | 30 days supply | Qty: 30 | Fill #0

## 2016-02-07 NOTE — Progress Notes (Signed)
Pt states he is here today for the following:  Follow up for Diabetes

## 2016-02-07 NOTE — Telephone Encounter (Signed)
Met with the patient and his wife when they were at the [facility] today for his appointment with Dr Jarold Song. They were inquiring about housing options and assistance with rent.  Provided them with the contact information for the [facility] as they do have income - # [phone], [place]. [address], [place], [place], [place] [address].  At the request of Dr Jarold Song, call placed to [place] to inquire about medicaid coverage for a BP monitor. Spoke to [place], who stated that medicaid does not cover BP monitors. Notified Dr Jarold Song and the patient and his wife.

## 2016-02-07 NOTE — Progress Notes (Signed)
Subjective:  Patient ID: Russell Price, male    DOB: 04/30/1961  Age: 55 y.o. MRN: [id number]  CC: Follow-up (DM)   HPI Russell Price is a 55 year old male with a history of hypertension, uncontrolled type 2 diabetes mellitus (A1c 8.5), diabetic neuropathy, blind in the right eye, stage IV chronic kidney disease who comes in today for follow-up of his diabetes mellitus.  He has been compliant with his Lantus and NovoLog sliding scale and blood sugar log reveals fasting sugars in the 120-170 range. He denies hypoglycemia. He was recently seen by his ophthalmologist.  Kidney disease is managed by [facility] and he is in the process of being worked up for placement of AV fistula. He has now obtained Medicaid but complains that he needs help with counseling given his disability benefit is minimal.  He has no complaints today  Past Medical History:  Diagnosis Date  . Chronic kidney disease   . Diabetes mellitus without complication ([place])    followed by Dr Chalmers Cater  . Hypertension    followed by [facility]    Past Surgical History:  Procedure Laterality Date  . BRONCHOSCOPY  02/14/2015   for pulm hemorrhage    No Known Allergies   Outpatient Medications Prior to Visit  Medication Sig Dispense Refill  . aspirin 81 MG chewable tablet Chew 1 tablet (81 mg total) by mouth daily.    . Blood Glucose Monitoring Suppl (ACCU-CHEK AVIVA) device Use as instructed 3 times daily before meals and at bedtime. 1 each 0  . glucose blood (ACCU-CHEK AVIVA PLUS) test strip Use 3 times daily before meals and at bedtime 100 each 12  . Lancet Devices (ACCU-CHEK SOFTCLIX) lancets Use as instructed 3 times daily before meals and at bedtime 1 each 12  . atorvastatin (LIPITOR) 40 MG tablet Take 1 tablet (40 mg total) by mouth daily at 6 PM. 30 tablet 3  . carvedilol (COREG) 6.25 MG tablet Take 1 tablet (6.25 mg total) by mouth 2 (two) times daily with a meal. 60 tablet 3  .  chlorthalidone (HYGROTON) 25 MG tablet Take 1 tablet (25 mg total) by mouth daily. 30 tablet 3  . gabapentin (NEURONTIN) 300 MG capsule Take 1 capsule (300 mg total) by mouth 2 (two) times daily. 60 capsule 3  . insulin aspart (NOVOLOG) 100 UNIT/ML injection Inject 0-12 Units into the skin 3 (three) times daily before meals. 3 vial 3  . insulin glargine (LANTUS) 100 UNIT/ML injection Inject 0.4 mLs (40 Units total) into the skin daily. 10 mL 3  . NIFEdipine (PROCARDIA-XL/ADALAT CC) 60 MG 24 hr tablet Take 1 tablet (60 mg total) by mouth daily. 30 tablet 3   No facility-administered medications prior to visit.     ROS Review of Systems  Constitutional: Negative for activity change and appetite change.  HENT: Negative for sinus pressure and sore throat.   Eyes: Positive for visual disturbance (blind in right eye).  Respiratory: Negative for cough, chest tightness and shortness of breath.   Cardiovascular: Negative for chest pain and leg swelling.  Gastrointestinal: Negative for abdominal distention, abdominal pain, constipation and diarrhea.  Endocrine: Negative.   Genitourinary: Negative for dysuria.  Musculoskeletal: Negative for joint swelling and myalgias.  Skin: Negative for rash.  Allergic/Immunologic: Negative.   Neurological: Negative for weakness, light-headedness and numbness.  Psychiatric/Behavioral: Negative for dysphoric mood and suicidal ideas.    Objective:  BP (!) 159/73 (BP Location: Right Arm, Patient Position: Sitting, Cuff Size:  Large)   Pulse (!) 49   Temp 97.6 F (36.4 C) (Oral)   Wt 174 lb 12.8 oz (79.3 kg)   BMI 24.38 kg/m   BP/Weight 02/07/2016 02/01/2016 3/53/6144  Systolic BP 315 400 867  Diastolic BP 73 66 60  Wt. (Lbs) 174.8 179 179.6  BMI 24.38 24.97 25.06      Physical Exam Constitutional: He is oriented to person, place, and time. He appears well-developed and well-nourished.  Eyes:  Blind in right eye  Neck: Normal range of motion. Neck  supple. No tracheal deviation present.  Cardiovascular: Bradycardic rate, regular rhythm and normal heart sounds.   No murmur heard. Pulmonary/Chest: Effort normal and breath sounds normal. No respiratory distress. He has no wheezes. He exhibits no tenderness.  Abdominal: Soft. Bowel sounds are normal. He exhibits no mass. There is no tenderness.  Musculoskeletal: Normal range of motion.  He exhibits no tenderness.  Neurological: He is alert and oriented to person, place, and time.  Skin: Skin is warm and dry.  Psychiatric: He has a normal mood and affect.   Assessment & Plan:   1. Type 2 diabetes mellitus with stage 4 chronic kidney disease, with long-term current use of insulin (HCC) A1c is 8.5 which is down from 10.1 previously No regimen changes as he is at high risk of hypoglycemia due to chronic medical conditions and chronic kidney disease. Up-to-date on annual eye exam and is currently followed by Margot Ables Awaiting placement of AV fistula - POCT glucose (manual entry) - POCT glycosylated hemoglobin (Hb A1C) - atorvastatin (LIPITOR) 40 MG tablet; Take 1 tablet (40 mg total) by mouth daily at 6 PM.  Dispense: 30 tablet; Refill: 5  2. Hyperlipidemia - atorvastatin (LIPITOR) 40 MG tablet; Take 1 tablet (40 mg total) by mouth daily at 6 PM.  Dispense: 30 tablet; Refill: 5  3. Essential hypertension Uncontrolled Increased dose of nifedipine from 60 mg to 90 mg; reduced dose of carvedilol due to bradycardia - carvedilol (COREG) 3.125 MG tablet; Take 1 tablet (3.125 mg total) by mouth 2 (two) times daily with a meal.  Dispense: 30 tablet; Refill: 5 - chlorthalidone (HYGROTON) 25 MG tablet; Take 1 tablet (25 mg total) by mouth daily.  Dispense: 30 tablet; Refill: 5 - NIFEdipine (ADALAT CC) 90 MG 24 hr tablet; Take 1 tablet (90 mg total) by mouth daily.  Dispense: 30 tablet; Refill: 5  4. Other diabetic neurological complication associated with type 2 diabetes mellitus  (HCC) Stable - gabapentin (NEURONTIN) 300 MG capsule; Take 1 capsule (300 mg total) by mouth 2 (two) times daily.  Dispense: 60 capsule; Refill: 5  5. Diabetes mellitus type 2 in nonobese (HCC) - insulin aspart (NOVOLOG) 100 UNIT/ML injection; Inject 0-12 Units into the skin 3 (three) times daily before meals.  Dispense: 3 vial; Refill: 5 - insulin glargine (LANTUS) 100 UNIT/ML injection; Inject 0.4 mLs (40 Units total) into the skin daily.  Dispense: 10 mL; Refill: 5  Case manager called in to discuss available options for housing for him.  Meds ordered this encounter  Medications  . atorvastatin (LIPITOR) 40 MG tablet    Sig: Take 1 tablet (40 mg total) by mouth daily at 6 PM.    Dispense:  30 tablet    Refill:  5  . carvedilol (COREG) 3.125 MG tablet    Sig: Take 1 tablet (3.125 mg total) by mouth 2 (two) times daily with a meal.    Dispense:  30 tablet  Refill:  5    Discontinue 6.25 mg  . chlorthalidone (HYGROTON) 25 MG tablet    Sig: Take 1 tablet (25 mg total) by mouth daily.    Dispense:  30 tablet    Refill:  5  . gabapentin (NEURONTIN) 300 MG capsule    Sig: Take 1 capsule (300 mg total) by mouth 2 (two) times daily.    Dispense:  60 capsule    Refill:  5  . insulin aspart (NOVOLOG) 100 UNIT/ML injection    Sig: Inject 0-12 Units into the skin 3 (three) times daily before meals.    Dispense:  3 vial    Refill:  5    As per sliding scale  . insulin glargine (LANTUS) 100 UNIT/ML injection    Sig: Inject 0.4 mLs (40 Units total) into the skin daily.    Dispense:  10 mL    Refill:  5    Discontinue previous dose  . NIFEdipine (ADALAT CC) 90 MG 24 hr tablet    Sig: Take 1 tablet (90 mg total) by mouth daily.    Dispense:  30 tablet    Refill:  5    Discontinue 60mg     Follow-up: Return in about 3 months (around 05/08/2016) for Follow-up on diabetes mellitus.   Arnoldo Morale MD

## 2016-02-08 ENCOUNTER — Ambulatory Visit (HOSPITAL_COMMUNITY)
Admission: RE | Admit: 2016-02-08 | Discharge: 2016-02-08 | Disposition: A | Discharge status: Home / Self Care | Payer: Medicaid Other | Source: Ambulatory Visit | Attending: Nephrology | Admitting: Nephrology

## 2016-02-08 DIAGNOSIS — D509 Iron deficiency anemia, unspecified: Secondary | ICD-10-CM | POA: Insufficient documentation

## 2016-02-08 MED ORDER — SODIUM CHLORIDE 0.9 % IV SOLN
510.0000 mg | INTRAVENOUS | Status: AC
  Administered 2016-02-08: 510 mg via INTRAVENOUS
  Filled 2016-02-08: qty 17

## 2016-02-08 MED FILL — RENA-VITE TABLET: 30 days supply | Qty: 30 | Fill #2

## 2016-02-09 MED FILL — ATORVASTATIN 40 MG TABLET: 40 | 30 days supply | Qty: 30 | Fill #0

## 2016-02-14 MED FILL — ACCU-CHEK AVIVA PLUS TEST S: 25 days supply | Qty: 100 | Fill #2

## 2016-02-15 ENCOUNTER — Encounter (HOSPITAL_COMMUNITY): Payer: Medicaid Other

## 2016-02-15 ENCOUNTER — Other Ambulatory Visit (HOSPITAL_COMMUNITY): Payer: Medicaid Other

## 2016-02-15 ENCOUNTER — Ambulatory Visit: Payer: Medicaid Other | Admitting: Vascular Surgery

## 2016-02-20 ENCOUNTER — Encounter: Payer: Self-pay | Admitting: Vascular Surgery

## 2016-02-23 ENCOUNTER — Ambulatory Visit (HOSPITAL_COMMUNITY)
Admission: RE | Admit: 2016-02-23 | Discharge: 2016-02-23 | Disposition: A | Discharge status: Home / Self Care | Payer: Medicaid Other | Source: Ambulatory Visit | Attending: Vascular Surgery | Admitting: Vascular Surgery

## 2016-02-23 ENCOUNTER — Encounter: Payer: Self-pay | Admitting: Vascular Surgery

## 2016-02-23 ENCOUNTER — Ambulatory Visit (INDEPENDENT_AMBULATORY_CARE_PROVIDER_SITE_OTHER)
Admission: RE | Admit: 2016-02-23 | Discharge: 2016-02-23 | Disposition: A | Discharge status: Home / Self Care | Payer: Medicaid Other | Source: Ambulatory Visit | Attending: Vascular Surgery | Admitting: Vascular Surgery

## 2016-02-23 ENCOUNTER — Ambulatory Visit (INDEPENDENT_AMBULATORY_CARE_PROVIDER_SITE_OTHER): Payer: Medicaid Other | Admitting: Vascular Surgery

## 2016-02-23 ENCOUNTER — Other Ambulatory Visit: Payer: Self-pay

## 2016-02-23 VITALS — BP 140/77 | HR 55 | Temp 97.1°F | Resp 16 | Ht 71.0 in | Wt 174.5 lb

## 2016-02-23 DIAGNOSIS — N184 Chronic kidney disease, stage 4 (severe): Secondary | ICD-10-CM

## 2016-02-23 DIAGNOSIS — Z01810 Encounter for preprocedural cardiovascular examination: Secondary | ICD-10-CM | POA: Diagnosis not present

## 2016-02-23 DIAGNOSIS — N185 Chronic kidney disease, stage 5: Secondary | ICD-10-CM | POA: Diagnosis not present

## 2016-02-23 MED FILL — CARVEDILOL 3.125 MG TABLET: 3.125 | 15 days supply | Qty: 30 | Fill #1

## 2016-02-23 NOTE — Progress Notes (Signed)
Patient ID: Russell Price, male   DOB: Mar 26, 1961, 55 y.o.   MRN: [id number]  Reason for Consult: New Evaluation (eval for AVF - ref by Dr. Ardeen Garland)   Referred by Arnoldo Morale, MD  Subjective:     HPI:  Russell Price is a 55 y.o. 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 [place]. He now has C [place] with a GFR in the teens and is sent from Dr. Moshe Cipro for discussion of dialysis access. He has hypertension as well as diabetes has never been a smoker. He has no surgeries of his bilateral upper extremities. He has no complaints related to today's visit.   Past Medical History:  Diagnosis Date  . Chronic kidney disease   . Diabetes mellitus without complication ([place])    followed by Dr Chalmers Cater  . Hypertension    followed by [facility]   Family History  Problem Relation Age of Onset  . Diabetes Mother    Past Surgical History:  Procedure Laterality Date  . BRONCHOSCOPY  02/14/2015   for pulm hemorrhage    Short Social History:  Social History  Substance Use Topics  . Smoking status: Former Smoker    Packs/day: 0.00    Years: 4.00    Quit date: 02/23/1983  . Smokeless tobacco: Never Used  . Alcohol use No    No Known Allergies  Current Outpatient Prescriptions  Medication Sig Dispense Refill  . aspirin 81 MG chewable tablet Chew 1 tablet (81 mg total) by mouth daily.    Marland Kitchen atorvastatin (LIPITOR) 40 MG tablet Take 1 tablet (40 mg total) by mouth daily at 6 PM. 30 tablet 5  . Blood Glucose Monitoring Suppl (ACCU-CHEK AVIVA) device Use as instructed 3 times daily before meals and at bedtime. 1 each 0  . carvedilol (COREG) 3.125 MG tablet Take 1 tablet (3.125 mg total) by mouth 2 (two) times daily with a meal. 30 tablet 5  . chlorthalidone (HYGROTON) 25 MG tablet Take 1 tablet (25 mg total) by mouth daily. 30 tablet 5  . gabapentin (NEURONTIN) 300 MG capsule Take 1 capsule (300 mg total) by mouth 2 (two)  times daily. 60 capsule 5  . glucose blood (ACCU-CHEK AVIVA PLUS) test strip Use 3 times daily before meals and at bedtime 100 each 12  . insulin aspart (NOVOLOG) 100 UNIT/ML injection Inject 0-12 Units into the skin 3 (three) times daily before meals. 3 vial 5  . insulin glargine (LANTUS) 100 UNIT/ML injection Inject 0.4 mLs (40 Units total) into the skin daily. 10 mL 5  . Lancet Devices (ACCU-CHEK SOFTCLIX) lancets Use as instructed 3 times daily before meals and at bedtime 1 each 12  . NIFEdipine (ADALAT CC) 90 MG 24 hr tablet Take 1 tablet (90 mg total) by mouth daily. 30 tablet 5   No current facility-administered medications for this visit.     Review of Systems  Constitutional:  Constitutional negative. HENT: HENT negative.  Eyes: Eyes negative.  Respiratory: Respiratory negative.  Cardiovascular: Cardiovascular negative.  GI: Gastrointestinal negative.  GU: Genitourinary negative. Musculoskeletal: Musculoskeletal negative.  Skin: Skin negative.  Neurological: Neurological negative. Hematologic: Hematologic/lymphatic negative.  Psychiatric: Psychiatric negative.        Objective:  Objective   Vitals:   02/23/16 0958 02/23/16 0959  BP: (!) 144/74 140/77  Pulse: (!) 55   Resp: 16   Temp: 97.1 F (36.2 C)   TempSrc: Oral   SpO2: 100%  Weight: 174 lb 8 oz (79.2 kg)   Height: 5\' 11"  (1.803 m)    Body mass index is 24.34 kg/m.  Physical Exam  Constitutional: He is oriented to person, place, and time. He appears well-nourished.  HENT:  Head: Atraumatic.  Eyes: EOM are normal.  Neck: Normal range of motion.  Cardiovascular:  Pulses:      Radial pulses are 2+ on the right side, and 2+ on the left side.       Femoral pulses are 2+ on the right side, and 2+ on the left side.      Popliteal pulses are 2+ on the right side, and 2+ on the left side.  Pulmonary/Chest: Effort normal.  Abdominal: Soft. He exhibits no mass.  Musculoskeletal: Normal range of motion. He  exhibits no edema.  Neurological: He is alert and oriented to person, place, and time.  Skin: Skin is warm and dry.  Psychiatric: He has a normal mood and affect. His behavior is normal. Judgment and thought content normal.    Data: Upper extremity venous duplex demonstrates basilic vein ranging from 0.34-0.42 cm on the left.  Brachial artery and left 0.45 cm at the antecubital fossa.     Assessment/Plan:   55 year old gentleman from [place] with CCK D GFR in the teen range will need dialysis access soon. Unfortunately has very small veins other than the basilic on the left side and he is right-hand dominant. I discussed with him a left-sided first stage cellophane transposition AV fistula. We talked about the risks of bleeding infection nerve damage injury to blood vessel, steal syndrome and imn. He demonstrates good understanding and is willing to proceed at first available appointment.     Waynetta Sandy MD [facility]

## 2016-02-24 NOTE — Progress Notes (Deleted)
Cardiology Office Note    Date:  02/24/2016   ID:  Russell Price, DOB 1960-06-10, MRN [id number]  PCP:  Arnoldo Morale, MD  Cardiologist: New  CC: pre op clearance prior to AV fistula placement  History of Present Illness:  Russell Price is a 55 y.o. male with a history of HTN, HLD, CVA, and ESRD with plans for HD who presents to clinic for pre op clearance  prior to AV fistula placement.  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 He has CKD with a GFR in the teens and is followed by Dr. Moshe Cipro. He was recently seen by Dr. Donzetta Matters with [facility] for evaluation for AV fistula placement.   He was admitted to [facility] in 02/2015 for acute resp failure, hemoptysis requiring intubation. EF 45% on initial echo, improved to 55% later. Trop trended up to 12. Found to have bilateral pulmonary (alveolar) hemorrhage, acute diastolic CHF, NSTEMI, and acute right thalamic CVA. He underwent nuclear stress test during this admission which was read as high risk. Dr. Sallyanne Kuster reviewed the images. He felt the study was more consistent with low risk, inferior wall ischemia. Patient was felt to not be a good candidate for cath due to elevated creat, recent CVA and pulmonary hemorrhage. Cath not felt to be indicated and he was continued on medical therapy.  He was admitted to [facility] in 06/9516 for DKA complicated by acute on chronic renal failure and acute respiratory failure requiring ventilatory support. 2D ECHO at that time showed normal LV function with Inferobasal hypokinesis, mild LVH, no RWMAS, mild LAE.   Today he presents to clinic for evaluation.   Past Medical History:  Diagnosis Date  . Chronic kidney disease   . Diabetes mellitus without complication ([place])    followed by Dr Chalmers Cater  . Hypertension    followed by [facility]    Past Surgical History:  Procedure Laterality Date  . BRONCHOSCOPY  02/14/2015   for pulm hemorrhage    Current Medications: Outpatient  Medications Prior to Visit  Medication Sig Dispense Refill  . aspirin 81 MG chewable tablet Chew 1 tablet (81 mg total) by mouth daily.    Marland Kitchen atorvastatin (LIPITOR) 40 MG tablet Take 1 tablet (40 mg total) by mouth daily at 6 PM. 30 tablet 5  . Blood Glucose Monitoring Suppl (ACCU-CHEK AVIVA) device Use as instructed 3 times daily before meals and at bedtime. 1 each 0  . carvedilol (COREG) 3.125 MG tablet Take 1 tablet (3.125 mg total) by mouth 2 (two) times daily with a meal. 30 tablet 5  . chlorthalidone (HYGROTON) 25 MG tablet Take 1 tablet (25 mg total) by mouth daily. 30 tablet 5  . gabapentin (NEURONTIN) 300 MG capsule Take 1 capsule (300 mg total) by mouth 2 (two) times daily. 60 capsule 5  . glucose blood (ACCU-CHEK AVIVA PLUS) test strip Use 3 times daily before meals and at bedtime 100 each 12  . insulin aspart (NOVOLOG) 100 UNIT/ML injection Inject 0-12 Units into the skin 3 (three) times daily before meals. 3 vial 5  . insulin glargine (LANTUS) 100 UNIT/ML injection Inject 0.4 mLs (40 Units total) into the skin daily. 10 mL 5  . Lancet Devices (ACCU-CHEK SOFTCLIX) lancets Use as instructed 3 times daily before meals and at bedtime 1 each 12  . NIFEdipine (ADALAT CC) 90 MG 24 hr tablet Take 1 tablet (90 mg total) by mouth daily. 30 tablet 5   No facility-administered  medications prior to visit.      Allergies:   Review of patient's allergies indicates no known allergies.   Social History   Social History  . Marital status: Single    Spouse name: N/A  . Number of children: N/A  . Years of education: N/A   Social History Main Topics  . Smoking status: Former Smoker    Packs/day: 0.00    Years: 4.00    Quit date: 02/23/1983  . Smokeless tobacco: Never Used  . Alcohol use No  . Drug use: No  . Sexual activity: Not on file   Other Topics Concern  . Not on file   Social History Narrative  . No narrative on file     Family History:  The patient's ***family history  includes Diabetes in his mother.     ROS:   Please see the history of present illness.    ROS All other systems reviewed and are negative.   PHYSICAL EXAM:   VS:  There were no vitals taken for this visit.   GEN: Well nourished, well developed, in no acute distress  HEENT: normal  Neck: no JVD, carotid bruits, or masses Cardiac: ***RRR; no murmurs, rubs, or gallops,no edema  Respiratory:  clear to auscultation bilaterally, normal work of breathing GI: soft, nontender, nondistended, + BS MS: no deformity or atrophy  Skin: warm and dry, no rash Neuro:  Alert and Oriented x 3, Strength and sensation are intact Psych: euthymic mood, full affect  Wt Readings from Last 3 Encounters:  02/23/16 174 lb 8 oz (79.2 kg)  02/08/16 174 lb (78.9 kg)  02/07/16 174 lb 12.8 oz (79.3 kg)      Studies/Labs Reviewed:   EKG:  EKG is*** ordered today.  The ekg ordered today demonstrates ***  Recent Labs: 02/27/2015: Brain Natriuretic Peptide 1,114.8 09/06/2015: Magnesium 2.1 09/21/2015: ALT 14 10/26/2015: BUN 91; Creat 4.20; Hemoglobin 8.7; Platelets 124; Potassium 4.8; Sodium 141   Lipid Panel    Component Value Date/Time   CHOL 92 (L) 10/09/2015 0951   TRIG 49 10/09/2015 0951   HDL 38 (L) 10/09/2015 0951   CHOLHDL 2.4 10/09/2015 0951   VLDL 10 10/09/2015 0951   LDLCALC 44 10/09/2015 0951    Additional studies/ records that were reviewed today include:  2D ECHO: 09/04/2015 LV EF: 55% -   60% Study Conclusions - Left ventricle: Inferobasal hypokinesis The cavity size was   normal. Wall thickness was increased in a pattern of mild LVH.   Systolic function was normal. The estimated ejection fraction was   in the range of 55% to 60%. Wall motion was normal; there were no   regional wall motion abnormalities. - Left atrium: The atrium was mildly dilated. - Atrial septum: No defect or patent foramen ovale was identified.   Myoview 02/2015 Study Result    T wave inversion was noted  during stress in the II, III, aVF, V6 and V5 leads.  Defect 1: There is a small defect of moderate severity present in the basal inferior and mid inferior location that is fixed and consistent with prior scar.  Defect 2: There is a small defect of moderate severity present in the apical inferior location consistent with ischemia.  Defect 3: There is a small defect of mild severity that is partially reversible in the basal inferolateral and mid inferolateral location consistent with scar and peri- infarct ischemia.  Defect 4: There is a small defect of mild severity present in the basal  anterior, mid anterior and apex location worrisome for ischemia.  This is a high risk study.  Nuclear stress EF: 35%.  The left ventricular ejection fraction is moderately decreased (30-44%).        ASSESSMENT & PLAN:   Pre op clearance:   HTN:BP well controlled currenlty  DMT2: uncontrolled. HgA1c 10.1.  ESRD: approaching HD. Most recent creat 4.2  Hx of CVA: continue ASA and statin  Medication Adjustments/Labs and Tests Ordered: Current medicines are reviewed at length with the patient today.  Concerns regarding medicines are outlined above.  Medication changes, Labs and Tests ordered today are listed in the Patient Instructions below. There are no Patient Instructions on file for this visit.   Signed, Angelena Form, PA-C  02/24/2016 4:59 PM    [facility] [place], [place], [place]  [address] Phone: [phone]; Fax: [phone]

## 2016-02-26 ENCOUNTER — Other Ambulatory Visit (HOSPITAL_COMMUNITY): Payer: Medicaid Other

## 2016-02-26 ENCOUNTER — Encounter (HOSPITAL_COMMUNITY): Payer: Medicaid Other

## 2016-02-26 ENCOUNTER — Ambulatory Visit: Payer: Medicaid Other | Admitting: Surgery

## 2016-02-26 ENCOUNTER — Ambulatory Visit: Payer: Medicaid Other | Admitting: Physician Assistant

## 2016-02-26 NOTE — Progress Notes (Addendum)
Cardiology Office Note    Date:  03/01/2016   ID:  Russell Price, DOB 03-22-1961, MRN [id number]  PCP:  Arnoldo Morale, MD  Cardiologist:  Dr. Sallyanne Kuster  CC: pre op clearance  History of Present Illness:  Russell Price is a 55 y.o. male with a history of  HTN, HLD, CVA, DMT2, and ESRD with plans for HD who presents to clinic for pre op clearance  prior to AV fistula placement.  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 He has CKD with a GFR in the teens and is followed by Dr. Moshe Cipro. He was recently seen by Dr. Donzetta Matters with [facility] for evaluation for AV fistula placement.   He was admitted to [facility] in 02/2015 for acute resp failure, hemoptysis requiring intubation. EF 45% on initial echo, improved to 55% later. Trop trended up to 12. Found to have bilateral pulmonary (alveolar) hemorrhage, acute diastolic CHF, NSTEMI, and acute right thalamic CVA. He underwent nuclear stress test during this admission which was read as high risk. Dr. Sallyanne Kuster reviewed the images. He felt the study was more consistent with low risk, inferior wall ischemia. Patient was felt to not be a good candidate for cath due to elevated creat, recent CVA and pulmonary hemorrhage. Cath not felt to be indicated at that time and he was continued on medical therapy.  He was admitted to [facility] in 0/9323 for DKA complicated by acute on chronic renal failure and acute respiratory failure requiring ventilatory support. 2D ECHO at that time showed normal LV function with Inferobasal hypokinesis, mild LVH, no RWMAS, mild LAE.   Today he presents to clinic for evaluation. He denies chest pain or SOB. No LE edema, orthopnea or PND. No dizziness or syncope. No pain in legs with walking. No blood in his stool or urine.    Past Medical History:  Diagnosis Date  . Chronic kidney disease   . Diabetes mellitus without complication ([place])    followed by Dr Chalmers Cater  . Hypertension    followed by [facility]    Past Surgical History:  Procedure Laterality Date  . BRONCHOSCOPY  02/14/2015   for pulm hemorrhage    Current Medications: Outpatient Medications Prior to Visit  Medication Sig Dispense Refill  . aspirin 81 MG chewable tablet Chew 1 tablet (81 mg total) by mouth daily.    Marland Kitchen atorvastatin (LIPITOR) 40 MG tablet Take 1 tablet (40 mg total) by mouth daily at 6 PM. 30 tablet 5  . Blood Glucose Monitoring Suppl (ACCU-CHEK AVIVA) device Use as instructed 3 times daily before meals and at bedtime. 1 each 0  . carvedilol (COREG) 3.125 MG tablet Take 1 tablet (3.125 mg total) by mouth 2 (two) times daily with a meal. 30 tablet 5  . chlorthalidone (HYGROTON) 25 MG tablet Take 1 tablet (25 mg total) by mouth daily. 30 tablet 5  . glucose blood (ACCU-CHEK AVIVA PLUS) test strip Use 3 times daily before meals and at bedtime 100 each 12  . insulin aspart (NOVOLOG) 100 UNIT/ML injection Inject 0-12 Units into the skin 3 (three) times daily before meals. 3 vial 5  . Lancet Devices (ACCU-CHEK SOFTCLIX) lancets Use as instructed 3 times daily before meals and at bedtime 1 each 12  . NIFEdipine (ADALAT CC) 90 MG 24 hr tablet Take 1 tablet (90 mg total) by mouth daily. 30 tablet 5  . insulin glargine (LANTUS) 100 UNIT/ML injection Inject 0.4 mLs (40 Units total) into  the skin daily. (Patient taking differently: Inject 40 Units into the skin at bedtime. ) 10 mL 5  . carvedilol (COREG) 6.25 MG tablet Take 6.25 mg by mouth 2 (two) times daily.   3  . gabapentin (NEURONTIN) 300 MG capsule Take 1 capsule (300 mg total) by mouth 2 (two) times daily. 60 capsule 5   No facility-administered medications prior to visit.      Allergies:   Review of patient's allergies indicates no known allergies.   Social History   Social History  . Marital status: Single    Spouse name: N/A  . Number of children: N/A  . Years of education: N/A   Social History Main Topics  . Smoking status: Former Smoker     Packs/day: 0.00    Years: 4.00    Quit date: 02/23/1983  . Smokeless tobacco: Never Used  . Alcohol use No  . Drug use: No  . Sexual activity: Not Asked   Other Topics Concern  . None   Social History Narrative  . None     Family History:  The patient's family history includes Diabetes in his mother.     ROS:   Please see the history of present illness.    ROS All other systems reviewed and are negative.   PHYSICAL EXAM:   VS:  BP 138/72   Pulse (!) 55   Ht 5\' 11"  (1.803 m)   Wt 171 lb 12.8 oz (77.9 kg)   BMI 23.96 kg/m    GEN: Well nourished, well developed, in no acute distress, chronically ill appearing HEENT: normal  Neck: no JVD, carotid bruits, or masses Cardiac: RRR; no murmurs, rubs, or gallops,no edema  Respiratory:  clear to auscultation bilaterally, normal work of breathing GI: soft, nontender, nondistended, + BS MS: no deformity or atrophy  Skin: warm and dry, no rash Neuro:  Alert and Oriented x 3, Strength and sensation are intact Psych: euthymic mood, full affect  Wt Readings from Last 3 Encounters:  03/01/16 171 lb 12.8 oz (77.9 kg)  02/23/16 174 lb 8 oz (79.2 kg)  02/08/16 174 lb (78.9 kg)      Studies/Labs Reviewed:   EKG:  EKG is ordered today.  The ekg ordered today demonstrates sinus bradycardia HR 55, LVH with repol abnormality.  Recent Labs: 09/06/2015: Magnesium 2.1 09/21/2015: ALT 14 10/26/2015: BUN 91; Creat 4.20; Hemoglobin 8.7; Platelets 124; Potassium 4.8; Sodium 141   Lipid Panel    Component Value Date/Time   CHOL 92 (L) 10/09/2015 0951   TRIG 49 10/09/2015 0951   HDL 38 (L) 10/09/2015 0951   CHOLHDL 2.4 10/09/2015 0951   VLDL 10 10/09/2015 0951   LDLCALC 44 10/09/2015 0951    Additional studies/ records that were reviewed today include:  2D ECHO: 09/04/2015 LV EF: 55% - 60% Study Conclusions - Left ventricle: Inferobasal hypokinesis The cavity size was normal. Wall thickness was increased in a pattern of mild  LVH. Systolic function was normal. The estimated ejection fraction was in the range of 55% to 60%. Wall motion was normal; there were no regional wall motion abnormalities. - Left atrium: The atrium was mildly dilated. - Atrial septum: No defect or patent foramen ovale was identified.   Myoview 02/2015 Study Result    T wave inversion was noted during stress in the II, III, aVF, V6 and V5 leads.  Defect 1: There is a small defect of moderate severity present in the basal inferior and mid inferior location that  is fixed and consistent with prior scar.  Defect 2: There is a small defect of moderate severity present in the apical inferior location consistent with ischemia.  Defect 3: There is a small defect of mild severity that is partially reversible in the basal inferolateral and mid inferolateral location consistent with scar and peri- infarct ischemia.  Defect 4: There is a small defect of mild severity present in the basal anterior, mid anterior and apex location worrisome for ischemia.  This is a high risk study.  Nuclear stress EF: 35%.  The left ventricular ejection fraction is moderately decreased (30-44%).      ASSESSMENT & PLAN:   Pre op clearance: I discussed the patient with Dr. Saunders Revel today. We feel that the patient will require a heart cath prior to being cleared for AV fistula placement. He had a high risk nuc with possible inferior ischemia and EF 35% in 02/2015 in the setting of NSTEMI (pk troponin 12) and acute respiratory failure/pulm hemorrhage/CVA. He has had a subsequent echo in 08/2015 which showed normal LV function. We will admit him Sunday night for pre hydration and cath on Monday with Dr. Ellyn Hack. I discussed case with Dr. Donzetta Matters ([facility]) who will cancel AV fistula placement planned for Monday. He did tell me that could put in a graft if necessary. I have called Dr. Gaynell Face office to discuss but she is off for the day. If I don't talk to her before  Monday, we will just consult nephrology when he is admitted on Sunday night.   I have reviewed the risks, indications, and alternatives to cardiac catheterization and possible angioplasty/stenting with the patient. Risks include but are not limited to bleeding, infection, vascular injury, stroke, myocardial infection, arrhythmia, kidney injury, radiation-related injury in the case of prolonged fluoroscopy use, emergency cardiac surgery, and death. The patient understands the risks of serious complication is low (<8%).    HTN: BP well controlled currenlty  DMT2: uncontrolled. HgA1c 10.1.  ESRD: approaching HD. Most recent creat 4.2  Hx of CVA: continue ASA and statin   Total time spent with patient was over 40 minutes which included evaluating patient, reviewing record and coordinating care. Face to face time >50%. A lot of time was necessary to discuss case with multiple physicians and setting up heart cath with pre hydration.    Medication Adjustments/Labs and Tests Ordered: Current medicines are reviewed at length with the patient today.  Concerns regarding medicines are outlined above.  Medication changes, Labs and Tests ordered today are listed in the Patient Instructions below. Patient Instructions  Medication Instructions:  Your physician recommends that you continue on your current medications as directed. Please refer to the Current Medication list given to you today.   Labwork: None ordered  Testing/Procedures:   Follow-Up: Your physician recommends that you schedule a follow-up appointment in:    Any Other Special Instructions Will Be Listed Below (If Applicable).    If you need a refill on your cardiac medications before your next appointment, please call your pharmacy.      Signed, Angelena Form, PA-C  03/01/2016 8:41 AM    [facility] [address], [place], [place]  [address] Phone: [phone]; Fax: [phone]

## 2016-02-28 MED FILL — LANTUS SOLOSTAR 100 UNITS/M: 100 | 25 days supply | Qty: 9 | Fill #4

## 2016-03-01 ENCOUNTER — Ambulatory Visit (INDEPENDENT_AMBULATORY_CARE_PROVIDER_SITE_OTHER): Payer: Medicaid Other | Admitting: Physician Assistant

## 2016-03-01 ENCOUNTER — Encounter: Payer: Self-pay | Admitting: Physician Assistant

## 2016-03-01 ENCOUNTER — Encounter: Payer: Self-pay | Admitting: *Deleted

## 2016-03-01 VITALS — BP 138/72 | HR 55 | Ht 71.0 in | Wt 171.8 lb

## 2016-03-01 DIAGNOSIS — N186 End stage renal disease: Secondary | ICD-10-CM

## 2016-03-01 DIAGNOSIS — Z01810 Encounter for preprocedural cardiovascular examination: Secondary | ICD-10-CM

## 2016-03-01 DIAGNOSIS — Z01818 Encounter for other preprocedural examination: Secondary | ICD-10-CM | POA: Insufficient documentation

## 2016-03-01 DIAGNOSIS — I10 Essential (primary) hypertension: Secondary | ICD-10-CM

## 2016-03-01 DIAGNOSIS — Z8673 Personal history of transient ischemic attack (TIA), and cerebral infarction without residual deficits: Secondary | ICD-10-CM | POA: Diagnosis not present

## 2016-03-01 DIAGNOSIS — R9439 Abnormal result of other cardiovascular function study: Secondary | ICD-10-CM

## 2016-03-01 DIAGNOSIS — E118 Type 2 diabetes mellitus with unspecified complications: Secondary | ICD-10-CM

## 2016-03-01 LAB — CBC WITH DIFFERENTIAL/PLATELET
BASOS ABS: 45 {cells}/uL (ref 0–200)
Basophils Relative: 1 %
EOS ABS: 135 {cells}/uL (ref 15–500)
EOS PCT: 3 %
HEMATOCRIT: 32.5 % — AB (ref 38.5–50.0)
HEMOGLOBIN: 10.7 g/dL — AB (ref 13.2–17.1)
LYMPHS ABS: 1305 {cells}/uL (ref 850–3900)
Lymphocytes Relative: 29 %
MCH: 27.9 pg (ref 27.0–33.0)
MCHC: 32.9 g/dL (ref 32.0–36.0)
MCV: 84.6 fL (ref 80.0–100.0)
MPV: 11.4 fL (ref 7.5–12.5)
Monocytes Absolute: 405 cells/uL (ref 200–950)
Monocytes Relative: 9 %
NEUTROS PCT: 58 %
Neutro Abs: 2610 cells/uL (ref 1500–7800)
Platelets: 126 10*3/uL — ABNORMAL LOW (ref 140–400)
RBC: 3.84 MIL/uL — ABNORMAL LOW (ref 4.20–5.80)
RDW: 15.7 % — ABNORMAL HIGH (ref 11.0–15.0)
WBC: 4.5 10*3/uL (ref 3.8–10.8)

## 2016-03-01 LAB — BASIC METABOLIC PANEL
BUN: 88 mg/dL — ABNORMAL HIGH (ref 7–25)
CALCIUM: 8.9 mg/dL (ref 8.6–10.3)
CO2: 22 mmol/L (ref 20–31)
Chloride: 107 mmol/L (ref 98–110)
Creat: 4.38 mg/dL — ABNORMAL HIGH (ref 0.70–1.33)
GLUCOSE: 277 mg/dL — AB (ref 65–99)
Potassium: 5.6 mmol/L — ABNORMAL HIGH (ref 3.5–5.3)
SODIUM: 137 mmol/L (ref 135–146)

## 2016-03-01 LAB — PROTIME-INR
INR: 1
PROTHROMBIN TIME: 11 s (ref 9.0–11.5)

## 2016-03-01 NOTE — Patient Instructions (Addendum)
Medication Instructions:  Your physician recommends that you continue on your current medications as directed. Please refer to the Current Medication list given to you today.   Labwork: TODAY:  BMET, CBC W/DIFF, & PT/INR  Testing/Procedures: Your physician has requested that you have a cardiac catheterization. Cardiac catheterization is used to diagnose and/or treat various heart conditions. Doctors may recommend this procedure for a number of different reasons. The most common reason is to evaluate chest pain. Chest pain can be a symptom of coronary artery disease (CAD), and cardiac catheterization can show whether plaque is narrowing or blocking your heart's arteries. This procedure is also used to evaluate the valves, as well as measure the blood flow and oxygen levels in different parts of your heart. For further information please visit [email or web address]. Please follow instruction sheet, as given.    Follow-Up: Your physician recommends that you schedule a follow-up appointment in: WILL BE SET UP AT DISCHARGE   Any Other Special Instructions Will Be Listed Below (If Applicable).  Coronary Angiogram A coronary angiogram, also called coronary angiography, is an X-ray procedure used to look at the arteries in the heart. In this procedure, a dye (contrast dye) is injected through a long, hollow tube (catheter). The catheter is about the size of a piece of cooked spaghetti and is inserted through your groin, wrist, or arm. The dye is injected into each artery, and X-rays are then taken to show if there is a blockage in the arteries of your heart. LET [facility] CARE PROVIDER KNOW ABOUT:  Any allergies you have, including allergies to shellfish or contrast dye.   All medicines you are taking, including vitamins, herbs, eye drops, creams, and over-the-counter medicines.   Previous problems you or members of your family have had with the use of anesthetics.   Any blood disorders you  have.   Previous surgeries you have had.  History of kidney problems or failure.   Other medical conditions you have. RISKS AND COMPLICATIONS  Generally, a coronary angiogram is a safe procedure. However, problems can occur and include:  Allergic reaction to the dye.  Bleeding from the access site or other locations.  Kidney injury, especially in people with impaired kidney function.  Stroke (rare).  Heart attack (rare). BEFORE THE PROCEDURE   Do not eat or drink anything after midnight the night before the procedure or as directed by your health care provider.   Ask your health care provider about changing or stopping your regular medicines. This is especially important if you are taking diabetes medicines or blood thinners. PROCEDURE  You may be given a medicine to help you relax (sedative) before the procedure. This medicine is given through an intravenous (IV) access tube that is inserted into one of your veins.   The area where the catheter will be inserted will be washed and shaved. This is usually done in the groin but may be done in the fold of your arm (near your elbow) or in the wrist.   A medicine will be given to numb the area where the catheter will be inserted (local anesthetic).   The health care provider will insert the catheter into an artery. The catheter will be guided by using a special type of X-ray (fluoroscopy) of the blood vessel being examined.   A special dye will then be injected into the catheter, and X-rays will be taken. The dye will help to show where any narrowing or blockages are located in the heart arteries.  AFTER THE PROCEDURE   If the procedure is done through the leg, you will be kept in bed lying flat for several hours. You will be instructed to not bend or cross your legs.  The insertion site will be checked frequently.   The pulse in your feet or wrist will be checked frequently.   Additional blood tests, X-rays, and an  electrocardiogram may be done.    This information is not intended to replace advice given to you by your health care provider. Make sure you discuss any questions you have with your health care provider.   Document Released: 11/10/2002 Document Revised: 05/27/2014 Document Reviewed: 09/28/2012 Elsevier Interactive Patient Education [employer].    If you need a refill on your cardiac medications before your next appointment, please call your pharmacy.

## 2016-03-01 NOTE — Addendum Note (Signed)
Addended by: Eileen Stanford on: 03/01/2016 02:33 PM   Modules accepted: Orders, SmartSet

## 2016-03-03 ENCOUNTER — Observation Stay (HOSPITAL_COMMUNITY)
Admission: RE | Admit: 2016-03-03 | Discharge: 2016-03-05 | Disposition: A | Discharge status: Home / Self Care | Payer: Medicaid Other | Source: Ambulatory Visit | Attending: Internal Medicine | Admitting: Internal Medicine

## 2016-03-03 ENCOUNTER — Encounter (HOSPITAL_COMMUNITY): Payer: Self-pay

## 2016-03-03 DIAGNOSIS — I10 Essential (primary) hypertension: Secondary | ICD-10-CM | POA: Diagnosis not present

## 2016-03-03 DIAGNOSIS — Z87891 Personal history of nicotine dependence: Secondary | ICD-10-CM | POA: Insufficient documentation

## 2016-03-03 DIAGNOSIS — Z7982 Long term (current) use of aspirin: Secondary | ICD-10-CM | POA: Diagnosis not present

## 2016-03-03 DIAGNOSIS — Z01818 Encounter for other preprocedural examination: Secondary | ICD-10-CM

## 2016-03-03 DIAGNOSIS — R9439 Abnormal result of other cardiovascular function study: Secondary | ICD-10-CM | POA: Diagnosis present

## 2016-03-03 DIAGNOSIS — Z79899 Other long term (current) drug therapy: Secondary | ICD-10-CM | POA: Insufficient documentation

## 2016-03-03 DIAGNOSIS — I25119 Atherosclerotic heart disease of native coronary artery with unspecified angina pectoris: Secondary | ICD-10-CM | POA: Diagnosis not present

## 2016-03-03 DIAGNOSIS — I5031 Acute diastolic (congestive) heart failure: Secondary | ICD-10-CM | POA: Insufficient documentation

## 2016-03-03 DIAGNOSIS — I252 Old myocardial infarction: Secondary | ICD-10-CM | POA: Diagnosis not present

## 2016-03-03 DIAGNOSIS — E785 Hyperlipidemia, unspecified: Secondary | ICD-10-CM | POA: Diagnosis not present

## 2016-03-03 DIAGNOSIS — N2581 Secondary hyperparathyroidism of renal origin: Secondary | ICD-10-CM | POA: Insufficient documentation

## 2016-03-03 DIAGNOSIS — I132 Hypertensive heart and chronic kidney disease with heart failure and with stage 5 chronic kidney disease, or end stage renal disease: Secondary | ICD-10-CM | POA: Diagnosis not present

## 2016-03-03 DIAGNOSIS — N186 End stage renal disease: Secondary | ICD-10-CM | POA: Insufficient documentation

## 2016-03-03 DIAGNOSIS — E1122 Type 2 diabetes mellitus with diabetic chronic kidney disease: Secondary | ICD-10-CM | POA: Diagnosis not present

## 2016-03-03 DIAGNOSIS — Z8673 Personal history of transient ischemic attack (TIA), and cerebral infarction without residual deficits: Secondary | ICD-10-CM | POA: Diagnosis not present

## 2016-03-03 DIAGNOSIS — Z794 Long term (current) use of insulin: Secondary | ICD-10-CM | POA: Diagnosis not present

## 2016-03-03 DIAGNOSIS — E119 Type 2 diabetes mellitus without complications: Secondary | ICD-10-CM

## 2016-03-03 DIAGNOSIS — D649 Anemia, unspecified: Secondary | ICD-10-CM | POA: Diagnosis not present

## 2016-03-03 DIAGNOSIS — N184 Chronic kidney disease, stage 4 (severe): Secondary | ICD-10-CM

## 2016-03-03 DIAGNOSIS — I2510 Atherosclerotic heart disease of native coronary artery without angina pectoris: Principal | ICD-10-CM | POA: Insufficient documentation

## 2016-03-03 DIAGNOSIS — I6330 Cerebral infarction due to thrombosis of unspecified cerebral artery: Secondary | ICD-10-CM | POA: Diagnosis present

## 2016-03-03 DIAGNOSIS — E118 Type 2 diabetes mellitus with unspecified complications: Secondary | ICD-10-CM | POA: Diagnosis present

## 2016-03-03 HISTORY — DX: Atherosclerotic heart disease of native coronary artery without angina pectoris: I25.10

## 2016-03-03 HISTORY — DX: End stage renal disease: N18.6

## 2016-03-03 LAB — CBC WITH DIFFERENTIAL/PLATELET
BASOS PCT: 1 %
Basophils Absolute: 0 10*3/uL (ref 0.0–0.1)
EOS PCT: 3 %
Eosinophils Absolute: 0.1 10*3/uL (ref 0.0–0.7)
HEMATOCRIT: 30.5 % — AB (ref 39.0–52.0)
Hemoglobin: 10.4 g/dL — ABNORMAL LOW (ref 13.0–17.0)
LYMPHS PCT: 31 %
Lymphs Abs: 1.6 10*3/uL (ref 0.7–4.0)
MCH: 27.6 pg (ref 26.0–34.0)
MCHC: 34.1 g/dL (ref 30.0–36.0)
MCV: 80.9 fL (ref 78.0–100.0)
MONO ABS: 0.5 10*3/uL (ref 0.1–1.0)
MONOS PCT: 10 %
NEUTROS ABS: 2.9 10*3/uL (ref 1.7–7.7)
Neutrophils Relative %: 56 %
Platelets: 125 10*3/uL — ABNORMAL LOW (ref 150–400)
RBC: 3.77 MIL/uL — ABNORMAL LOW (ref 4.22–5.81)
RDW: 14.6 % (ref 11.5–15.5)
WBC: 5.1 10*3/uL (ref 4.0–10.5)

## 2016-03-03 LAB — COMPREHENSIVE METABOLIC PANEL
ALBUMIN: 3.5 g/dL (ref 3.5–5.0)
ALK PHOS: 94 U/L (ref 38–126)
ALT: 32 U/L (ref 17–63)
ANION GAP: 7 (ref 5–15)
AST: 15 U/L (ref 15–41)
BUN: 86 mg/dL — AB (ref 6–20)
CALCIUM: 9 mg/dL (ref 8.9–10.3)
CO2: 20 mmol/L — AB (ref 22–32)
Chloride: 112 mmol/L — ABNORMAL HIGH (ref 101–111)
Creatinine, Ser: 4.47 mg/dL — ABNORMAL HIGH (ref 0.61–1.24)
GFR calc Af Amer: 15 mL/min — ABNORMAL LOW (ref >60)
GFR calc non Af Amer: 13 mL/min — ABNORMAL LOW (ref >60)
GLUCOSE: 157 mg/dL — AB (ref 65–99)
Potassium: 5 mmol/L (ref 3.5–5.1)
SODIUM: 139 mmol/L (ref 135–145)
Total Bilirubin: 0.2 mg/dL — ABNORMAL LOW (ref 0.3–1.2)
Total Protein: 6.9 g/dL (ref 6.5–8.1)

## 2016-03-03 LAB — GLUCOSE, CAPILLARY
GLUCOSE-CAPILLARY: 123 mg/dL — AB (ref 65–99)
Glucose-Capillary: 145 mg/dL — ABNORMAL HIGH (ref 65–99)

## 2016-03-03 LAB — SURGICAL PCR SCREEN
MRSA, PCR: NEGATIVE
STAPHYLOCOCCUS AUREUS: NEGATIVE

## 2016-03-03 MED ORDER — INSULIN ASPART 100 UNIT/ML ~~LOC~~ SOLN
0.0000 [IU] | Freq: Three times a day (TID) | SUBCUTANEOUS | Status: DC
  Administered 2016-03-04: 11 [IU] via SUBCUTANEOUS
  Administered 2016-03-05 (×2): 3 [IU] via SUBCUTANEOUS

## 2016-03-03 MED ORDER — HEPARIN SODIUM (PORCINE) 5000 UNIT/ML IJ SOLN
5000.0000 [IU] | Freq: Three times a day (TID) | INTRAMUSCULAR | Status: DC
  Administered 2016-03-03 – 2016-03-05 (×5): 5000 [IU] via SUBCUTANEOUS
  Filled 2016-03-03 (×4): qty 1

## 2016-03-03 MED ORDER — ACETAMINOPHEN 325 MG PO TABS: 650.0000 mg | ORAL_TABLET | ORAL | Status: DC | PRN

## 2016-03-03 MED ORDER — ASPIRIN EC 81 MG PO TBEC
81.0000 mg | DELAYED_RELEASE_TABLET | Freq: Every day | ORAL | Status: DC
  Administered 2016-03-05: 81 mg via ORAL
  Filled 2016-03-03: qty 1

## 2016-03-03 MED ORDER — ATORVASTATIN CALCIUM 40 MG PO TABS
40.0000 mg | ORAL_TABLET | Freq: Every day | ORAL | Status: DC
  Administered 2016-03-04: 40 mg via ORAL
  Filled 2016-03-03 (×2): qty 1

## 2016-03-03 MED ORDER — SODIUM CHLORIDE 0.9 % IV SOLN: 250.0000 mL | INTRAVENOUS | Status: DC | PRN

## 2016-03-03 MED ORDER — NIFEDIPINE ER OSMOTIC RELEASE 30 MG PO TB24
90.0000 mg | ORAL_TABLET | Freq: Every day | ORAL | Status: DC
  Administered 2016-03-05: 90 mg via ORAL
  Filled 2016-03-03: qty 3

## 2016-03-03 MED ORDER — ONDANSETRON HCL 4 MG/2ML IJ SOLN: 4.0000 mg | Freq: Four times a day (QID) | INTRAMUSCULAR | Status: DC | PRN

## 2016-03-03 MED ORDER — SODIUM CHLORIDE 0.9% FLUSH
3.0000 mL | Freq: Two times a day (BID) | INTRAVENOUS | Status: DC
  Administered 2016-03-03: 3 mL via INTRAVENOUS

## 2016-03-03 MED ORDER — SODIUM CHLORIDE 0.9 % WEIGHT BASED INFUSION
1.0000 mL/kg/h | INTRAVENOUS | Status: DC
  Administered 2016-03-03: 1 mL/kg/h via INTRAVENOUS

## 2016-03-03 MED ORDER — INSULIN GLARGINE 100 UNIT/ML ~~LOC~~ SOLN
40.0000 [IU] | Freq: Every day | SUBCUTANEOUS | Status: DC
  Filled 2016-03-03 (×3): qty 0.4

## 2016-03-03 MED ORDER — CARVEDILOL 3.125 MG PO TABS
3.1250 mg | ORAL_TABLET | Freq: Two times a day (BID) | ORAL | Status: DC
  Administered 2016-03-04 – 2016-03-05 (×3): 3.125 mg via ORAL
  Filled 2016-03-03 (×2): qty 1

## 2016-03-03 MED ORDER — INFLUENZA VAC SPLIT QUAD 0.5 ML IM SUSY: 0.5000 mL | PREFILLED_SYRINGE | INTRAMUSCULAR | Status: DC

## 2016-03-03 MED ORDER — ASPIRIN 300 MG RE SUPP: 300.0000 mg | RECTAL | Status: AC

## 2016-03-03 MED ORDER — NITROGLYCERIN 0.4 MG SL SUBL: 0.4000 mg | SUBLINGUAL_TABLET | SUBLINGUAL | Status: DC | PRN

## 2016-03-03 MED ORDER — INSULIN GLARGINE 100 UNIT/ML ~~LOC~~ SOLN
20.0000 [IU] | Freq: Once | SUBCUTANEOUS | Status: AC
  Administered 2016-03-03: 20 [IU] via SUBCUTANEOUS
  Filled 2016-03-03: qty 0.2

## 2016-03-03 MED ORDER — SODIUM CHLORIDE 0.9% FLUSH: 3.0000 mL | INTRAVENOUS | Status: DC | PRN

## 2016-03-03 MED ORDER — SODIUM CHLORIDE 0.9 % IV SOLN
INTRAVENOUS | Status: DC
  Administered 2016-03-03: 19:00:00 via INTRAVENOUS

## 2016-03-03 MED ORDER — PNEUMOCOCCAL VAC POLYVALENT 25 MCG/0.5ML IJ INJ: 0.5000 mL | INJECTION | INTRAMUSCULAR | Status: DC

## 2016-03-03 MED ORDER — ASPIRIN 81 MG PO CHEW
81.0000 mg | CHEWABLE_TABLET | ORAL | Status: AC
  Administered 2016-03-04: 81 mg via ORAL
  Filled 2016-03-03: qty 1

## 2016-03-03 MED ORDER — ASPIRIN 81 MG PO CHEW
324.0000 mg | CHEWABLE_TABLET | ORAL | Status: AC
  Administered 2016-03-03: 324 mg via ORAL
  Filled 2016-03-03: qty 4

## 2016-03-03 NOTE — Progress Notes (Deleted)
H&P   Date:  03/03/2016   ID:  Russell Price, DOB [birth year], MRN [id number]  PCP:  Russell Morale, MD  Cardiologist:  Russell Price  CC: pre op clearance  History of Present Illness:  Russell Price is a 55 y.o. male with a history of  HTN, HLD, CVA, DMT2, and ESRD with plans for HD who presents to clinic for pre op clearance  prior to AV fistula placement.  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 He has CKD with a GFR in the teens and is followed by Russell Price. He was recently seen by Russell Price with [facility] for evaluation for AV fistula placement.   He was admitted to [facility] in 02/2015 for acute resp failure, hemoptysis requiring intubation. EF 45% on initial echo, improved to 55% later. Trop trended up to 12. Found to have bilateral pulmonary (alveolar) hemorrhage, acute diastolic CHF, NSTEMI, and acute right thalamic CVA. He underwent nuclear stress test during this admission which was read as high risk. Russell Price reviewed the images. He felt the study was more consistent with low risk, inferior wall ischemia. Patient was felt to not be a good candidate for cath due to elevated creat, recent CVA and pulmonary hemorrhage. Cath not felt to be indicated at that time and he was continued on medical therapy.  He was admitted to [facility] in 06/5001 for DKA complicated by acute on chronic renal failure and acute respiratory failure requiring ventilatory support. 2D ECHO at that time showed normal LV function with Inferobasal hypokinesis, mild LVH, no RWMAS, mild LAE.   On 10/13 he presented to clinic for evaluation. He denies chest pain or SOB. No LE edema, orthopnea or PND. No dizziness or syncope. No pain in legs with walking. No blood in his stool or urine.    Past Medical History:  Diagnosis Date  . Chronic kidney disease   . Diabetes mellitus without complication ([place])    followed by Russell Price  . Hypertension    followed by [facility]    Past  Surgical History:  Procedure Laterality Date  . BRONCHOSCOPY  02/14/2015   for pulm hemorrhage    Current Medications:  (Not in an outpatient encounter)   Allergies:   No known allergies   Social History   Social History  . Marital status: Single    Spouse name: N/A  . Number of children: N/A  . Years of education: N/A   Social History Main Topics  . Smoking status: Former Smoker    Packs/day: 0.00    Years: 4.00    Quit date: 02/23/1983  . Smokeless tobacco: Never Used  . Alcohol use No  . Drug use: No  . Sexual activity: Not on file   Other Topics Concern  . Not on file   Social History Narrative  . No narrative on file     Family History:  The patient's family history includes Diabetes in his mother.     ROS:   Please see the history of present illness.    ROS All other systems reviewed and are negative.   PHYSICAL EXAM:   VS:  BP (!) 156/66 (BP Location: Right Arm)   Temp 98.4 F (36.9 C) (Oral)   Resp 20   Wt 170 lb 6.7 oz (77.3 kg)   SpO2 100%   BMI 23.77 kg/m    GEN: Well nourished, well developed, in no acute distress, chronically ill appearing HEENT: normal  Neck: no JVD, carotid bruits, or masses Cardiac: RRR; no murmurs, rubs, or gallops,no edema  Respiratory:  clear to auscultation bilaterally, normal work of breathing GI: soft, nontender, nondistended, + BS MS: no deformity or atrophy  Skin: warm and dry, no rash Neuro:  Alert and Oriented x 3, Strength and sensation are intact Psych: euthymic mood, full affect  Wt Readings from Last 3 Encounters:  03/03/16 170 lb 6.7 oz (77.3 kg)  03/01/16 171 lb 12.8 oz (77.9 kg)  02/23/16 174 lb 8 oz (79.2 kg)      Studies/Labs Reviewed:   EKG:  EKG is ordered today.  The ekg ordered today demonstrates sinus bradycardia HR 55, LVH with repol abnormality.  Recent Labs: 09/06/2015: Magnesium 2.1 09/21/2015: ALT 14 03/01/2016: BUN 88; Creat 4.38; Hemoglobin 10.7; Platelets 126; Potassium 5.6; Sodium  137   Lipid Panel    Component Value Date/Time   CHOL 92 (L) 10/09/2015 0951   TRIG 49 10/09/2015 0951   HDL 38 (L) 10/09/2015 0951   CHOLHDL 2.4 10/09/2015 0951   VLDL 10 10/09/2015 0951   LDLCALC 44 10/09/2015 0951    Additional studies/ records that were reviewed today include:  2D ECHO: 09/04/2015 LV EF: 55% - 60% Study Conclusions - Left ventricle: Inferobasal hypokinesis The cavity size was normal. Wall thickness was increased in a pattern of mild LVH. Systolic function was normal. The estimated ejection fraction was in the range of 55% to 60%. Wall motion was normal; there were no regional wall motion abnormalities. - Left atrium: The atrium was mildly dilated. - Atrial septum: No defect or patent foramen ovale was identified.   Myoview 02/2015 Study Result    T wave inversion was noted during stress in the II, III, aVF, V6 and V5 leads.  Defect 1: There is a small defect of moderate severity present in the basal inferior and mid inferior location that is fixed and consistent with prior scar.  Defect 2: There is a small defect of moderate severity present in the apical inferior location consistent with ischemia.  Defect 3: There is a small defect of mild severity that is partially reversible in the basal inferolateral and mid inferolateral location consistent with scar and peri- infarct ischemia.  Defect 4: There is a small defect of mild severity present in the basal anterior, mid anterior and apex location worrisome for ischemia.  This is a high risk study.  Nuclear stress EF: 35%.  The left ventricular ejection fraction is moderately decreased (30-44%).      ASSESSMENT & PLAN:   Pre op clearance: I discussed the patient with Russell. Saunders Price today. We feel that the patient will require a heart cath prior to being cleared for AV fistula placement. He had a high risk nuc with possible inferior ischemia and EF 35% in 02/2015 in the setting of NSTEMI (pk  troponin 12) and acute respiratory failure/pulm hemorrhage/CVA. He has had a subsequent echo in 08/2015 which showed normal LV function. We will admit him Sunday night for pre hydration and cath on Monday with Russell. Ellyn Hack. I discussed case with Russell Price ([facility]) who will cancel AV fistula placement planned for Monday. He did tell me that could put in a graft if necessary. I have called Russell. Gaynell Face office to discuss but she is off for the day. If I don't talk to her before Monday, we will just consult nephrology when he is admitted on Sunday night.   I have reviewed the risks, indications, and alternatives to  cardiac catheterization and possible angioplasty/stenting with the patient. Risks include but are not limited to bleeding, infection, vascular injury, stroke, myocardial infection, arrhythmia, kidney injury, radiation-related injury in the case of prolonged fluoroscopy use, emergency cardiac surgery, and death. The patient understands the risks of serious complication is low (<0%).    HTN: BP well controlled currenlty  DMT2: uncontrolled. HgA1c 10.1.  ESRD: approaching HD. Most recent creat 4.2  Hx of CVA: continue ASA and statin   Total time spent with patient was over 40 minutes which included evaluating patient, reviewing record and coordinating care. Face to face time >50%. A lot of time was necessary to discuss case with multiple physicians and setting up heart cath with pre hydration.    Medication Adjustments/Labs and Tests Ordered: Current medicines are reviewed at length with the patient today.  Concerns regarding medicines are outlined above.  Medication changes, Labs and Tests ordered today are listed in the Patient Instructions below. There are no outpatient Patient Instructions on file for this admission.   Signed, Angelena Form, PA-c 03/03/2016 3:55 PM

## 2016-03-03 NOTE — H&P (Signed)
H&P   Date:  03/03/2016   ID:  Russell Price, DOB Oct 13, 1960, MRN [id number]  PCP:  Russell Morale, MD  Cardiologist:  Russell. Sallyanne Price  CC: pre op clearance  History of Present Illness:  Russell Price is a 55 y.o. male with a history of  HTN, HLD, CVA, DMT2, and ESRD with plans for HD who presents to clinic for pre op clearance  prior to AV fistula placement.  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 He has CKD with a GFR in the teens and is followed by Russell. Moshe Price. He was recently seen by Russell. Donzetta Price with [facility] for evaluation for AV fistula placement.   He was admitted to [facility] in 02/2015 for acute resp failure, hemoptysis requiring intubation. EF 45% on initial echo, improved to 55% later. Trop trended up to 12. Found to have bilateral pulmonary (alveolar) hemorrhage, acute diastolic CHF, NSTEMI, and acute right thalamic CVA. He underwent nuclear stress test during this admission which was read as high risk. Russell. Sallyanne Price reviewed the images. He felt the study was more consistent with low risk, inferior wall ischemia. Patient was felt to not be a good candidate for cath due to elevated creat, recent CVA and pulmonary hemorrhage. Cath not felt to be indicated at that time and he was continued on medical therapy.  He was admitted to [facility] in 12/1854 for DKA complicated by acute on chronic renal failure and acute respiratory failure requiring ventilatory support. 2D ECHO at that time showed normal LV function with Inferobasal hypokinesis, mild LVH, no RWMAS, mild LAE.   On 10/13 he presented to clinic for evaluation. He denies chest pain or SOB. No LE edema, orthopnea or PND. No dizziness or syncope. No pain in legs with walking. No blood in his stool or urine.    Past Medical History:  Diagnosis Date  . Chronic kidney disease   . Diabetes mellitus without complication ([place])    followed by Russell Price  . Hypertension    followed by Russell Price    Past  Surgical History:  Procedure Laterality Date  . BRONCHOSCOPY  02/14/2015   for pulm hemorrhage    Current Medications:  (Not in an outpatient encounter)   Allergies:   No known allergies   Social History   Social History  . Marital status: Single    Spouse name: N/A  . Number of children: N/A  . Years of education: N/A   Social History Main Topics  . Smoking status: Former Smoker    Packs/day: 0.00    Years: 4.00    Quit date: 02/23/1983  . Smokeless tobacco: Never Used  . Alcohol use No  . Drug use: No  . Sexual activity: Not on file   Other Topics Concern  . Not on file   Social History Narrative  . No narrative on file     Family History:  The patient's family history includes Diabetes in his mother.     ROS:   Please see the history of present illness.    ROS All other systems reviewed and are negative.   PHYSICAL EXAM:   VS:  BP (!) 156/66 (BP Location: Right Arm)   Temp 98.4 F (36.9 C) (Oral)   Resp 20   Wt 170 lb 6.7 oz (77.3 kg)   SpO2 100%   BMI 23.77 kg/m    GEN: Well nourished, well developed, in no acute distress, chronically ill appearing HEENT: normal  Neck: no JVD, carotid bruits, or masses Cardiac: RRR; no murmurs, rubs, or gallops,no edema  Respiratory:  clear to auscultation bilaterally, normal work of breathing GI: soft, nontender, nondistended, + BS MS: no deformity or atrophy  Skin: warm and dry, no rash Neuro:  Alert and Oriented x 3, Strength and sensation are intact Psych: euthymic mood, full affect  Wt Readings from Last 3 Encounters:  03/03/16 170 lb 6.7 oz (77.3 kg)  03/01/16 171 lb 12.8 oz (77.9 kg)  02/23/16 174 lb 8 oz (79.2 kg)      Studies/Labs Reviewed:   EKG:  EKG is ordered today.  The ekg ordered today demonstrates sinus bradycardia HR 55, LVH with repol abnormality.  Recent Labs: 09/06/2015: Magnesium 2.1 09/21/2015: ALT 14 03/01/2016: BUN 88; Creat 4.38; Hemoglobin 10.7; Platelets 126; Potassium 5.6; Sodium  137   Lipid Panel    Component Value Date/Time   CHOL 92 (L) 10/09/2015 0951   TRIG 49 10/09/2015 0951   HDL 38 (L) 10/09/2015 0951   CHOLHDL 2.4 10/09/2015 0951   VLDL 10 10/09/2015 0951   LDLCALC 44 10/09/2015 0951    Additional studies/ records that were reviewed today include:  2D ECHO: 09/04/2015 LV EF: 55% - 60% Study Conclusions - Left ventricle: Inferobasal hypokinesis The cavity size was normal. Wall thickness was increased in a pattern of mild LVH. Systolic function was normal. The estimated ejection fraction was in the range of 55% to 60%. Wall motion was normal; there were no regional wall motion abnormalities. - Left atrium: The atrium was mildly dilated. - Atrial septum: No defect or patent foramen ovale was identified.   Myoview 02/2015 Study Result    T wave inversion was noted during stress in the II, III, aVF, V6 and V5 leads.  Defect 1: There is a small defect of moderate severity present in the basal inferior and mid inferior location that is fixed and consistent with prior scar.  Defect 2: There is a small defect of moderate severity present in the apical inferior location consistent with ischemia.  Defect 3: There is a small defect of mild severity that is partially reversible in the basal inferolateral and mid inferolateral location consistent with scar and peri- infarct ischemia.  Defect 4: There is a small defect of mild severity present in the basal anterior, mid anterior and apex location worrisome for ischemia.  This is a high risk study.  Nuclear stress EF: 35%.  The left ventricular ejection fraction is moderately decreased (30-44%).      ASSESSMENT & PLAN:   Pre op clearance: I discussed the patient with Russell Price today. We feel that the patient will require a heart cath prior to being cleared for AV fistula placement. He had a high risk nuc with possible inferior ischemia and EF 35% in 02/2015 in the setting of NSTEMI (pk  troponin 12) and acute respiratory failure/pulm hemorrhage/CVA. He has had a subsequent echo in 08/2015 which showed normal LV function. We will admit him Sunday night for pre hydration and cath on Monday with Russell. Ellyn Hack. I discussed case with Russell. Donzetta Price ([facility]) who will cancel AV fistula placement planned for Monday. He did tell me that could put in a graft if necessary. I have called Russell. Gaynell Face office to discuss but she is off for the day. If I don't talk to her before Monday, we will just consult nephrology when he is admitted on Sunday night.   I have reviewed the risks, indications, and alternatives to  cardiac catheterization and possible angioplasty/stenting with the patient. Risks include but are not limited to bleeding, infection, vascular injury, stroke, myocardial infection, arrhythmia, kidney injury, radiation-related injury in the case of prolonged fluoroscopy use, emergency cardiac surgery, and death. The patient understands the risks of serious complication is low (<8%).    HTN: BP well controlled currenlty  DMT2: uncontrolled. HgA1c 10.1.  ESRD: approaching HD. Most recent creat 4.2  Hx of CVA: continue ASA and statin   Total time spent with patient was over 40 minutes which included evaluating patient, reviewing record and coordinating care. Face to face time >50%. A lot of time was necessary to discuss case with multiple physicians and setting up heart cath with pre hydration.    Medication Adjustments/Labs and Tests Ordered: Current medicines are reviewed at length with the patient today.  Concerns regarding medicines are outlined above.  Medication changes, Labs and Tests ordered today are listed in the Patient Instructions below. There are no outpatient Patient Instructions on file for this admission.   Signed, Angelena Form, PA-c 03/03/2016 3:55 PM    I have seen, examined the patient, and reviewed the above assessment and plan.  On exam RRR.  Changes to above  are made where necessary.  Discussed the cath with the patient. The patient understands that risks included but are not limited to stroke (1 in 1000), death (1 in 56), kidney failure (increased given his baseline renal impairment), bleeding (1 in 200), allergic reaction [possibly serious] (1 in 200). The patient understands and agrees to proceed.   Gentle hydration overnight and cath in am.    Co Sign: Thompson Grayer, MD 03/03/2016 5:11 PM

## 2016-03-04 ENCOUNTER — Encounter (HOSPITAL_COMMUNITY)
Admission: RE | Disposition: A | Discharge status: Home / Self Care | Payer: Self-pay | Source: Ambulatory Visit | Attending: Internal Medicine

## 2016-03-04 DIAGNOSIS — R9439 Abnormal result of other cardiovascular function study: Secondary | ICD-10-CM | POA: Diagnosis present

## 2016-03-04 DIAGNOSIS — I2510 Atherosclerotic heart disease of native coronary artery without angina pectoris: Secondary | ICD-10-CM | POA: Diagnosis not present

## 2016-03-04 HISTORY — PX: CARDIAC CATHETERIZATION: SHX172

## 2016-03-04 LAB — GLUCOSE, CAPILLARY
GLUCOSE-CAPILLARY: 32 mg/dL — AB (ref 65–99)
GLUCOSE-CAPILLARY: 90 mg/dL (ref 65–99)
GLUCOSE-CAPILLARY: 64 mg/dL — AB (ref 65–99)
GLUCOSE-CAPILLARY: 132 mg/dL — AB (ref 65–99)
GLUCOSE-CAPILLARY: 330 mg/dL — AB (ref 65–99)
Glucose-Capillary: 131 mg/dL — ABNORMAL HIGH (ref 65–99)
Glucose-Capillary: 110 mg/dL — ABNORMAL HIGH (ref 65–99)
Glucose-Capillary: 64 mg/dL — ABNORMAL LOW (ref 65–99)

## 2016-03-04 LAB — BASIC METABOLIC PANEL
ANION GAP: 7 (ref 5–15)
BUN: 80 mg/dL — ABNORMAL HIGH (ref 6–20)
CHLORIDE: 115 mmol/L — AB (ref 101–111)
CO2: 20 mmol/L — AB (ref 22–32)
Calcium: 8.9 mg/dL (ref 8.9–10.3)
Creatinine, Ser: 4.19 mg/dL — ABNORMAL HIGH (ref 0.61–1.24)
GFR calc non Af Amer: 14 mL/min — ABNORMAL LOW (ref >60)
GFR, EST AFRICAN AMERICAN: 17 mL/min — AB (ref >60)
Glucose, Bld: 40 mg/dL — CL (ref 65–99)
Potassium: 4.2 mmol/L (ref 3.5–5.1)
Sodium: 142 mmol/L (ref 135–145)

## 2016-03-04 SURGERY — LEFT HEART CATH AND CORONARY ANGIOGRAPHY

## 2016-03-04 SURGERY — ARTERIOVENOUS (AV) FISTULA CREATION
Anesthesia: Monitor Anesthesia Care | Laterality: Left

## 2016-03-04 MED ORDER — IOPAMIDOL (ISOVUE-370) INJECTION 76%
INTRAVENOUS | Status: DC | PRN
  Administered 2016-03-04: 40 mL via INTRA_ARTERIAL

## 2016-03-04 MED ORDER — FENTANYL CITRATE (PF) 100 MCG/2ML IJ SOLN
INTRAMUSCULAR | Status: DC | PRN
  Administered 2016-03-04: 50 ug via INTRAVENOUS

## 2016-03-04 MED ORDER — DEXTROSE-NACL 5-0.45 % IV SOLN
INTRAVENOUS | Status: DC
  Administered 2016-03-04: 07:00:00 via INTRAVENOUS

## 2016-03-04 MED ORDER — DEXTROSE-NACL 5-0.45 % IV SOLN: INTRAVENOUS | Status: DC

## 2016-03-04 MED ORDER — IOPAMIDOL (ISOVUE-370) INJECTION 76%
INTRAVENOUS | Status: AC
  Filled 2016-03-04: qty 100

## 2016-03-04 MED ORDER — CARVEDILOL 6.25 MG PO TABS
6.2500 mg | ORAL_TABLET | Freq: Once | ORAL | Status: AC
  Administered 2016-03-04: 6.25 mg via ORAL
  Filled 2016-03-04: qty 1

## 2016-03-04 MED ORDER — DEXTROSE 50 % IV SOLN
INTRAVENOUS | Status: AC
  Administered 2016-03-04: 35 mL
  Filled 2016-03-04: qty 50

## 2016-03-04 MED ORDER — HYDRALAZINE HCL 20 MG/ML IJ SOLN
20.0000 mg | INTRAMUSCULAR | Status: DC | PRN
  Administered 2016-03-04: 20 mg via INTRAVENOUS
  Filled 2016-03-04: qty 1

## 2016-03-04 MED ORDER — HEPARIN (PORCINE) IN NACL 2-0.9 UNIT/ML-% IJ SOLN
INTRAMUSCULAR | Status: DC | PRN
  Administered 2016-03-04: 1000 mL

## 2016-03-04 MED ORDER — LIDOCAINE HCL (PF) 1 % IJ SOLN
INTRAMUSCULAR | Status: AC
  Filled 2016-03-04: qty 30

## 2016-03-04 MED ORDER — MIDAZOLAM HCL 2 MG/2ML IJ SOLN
INTRAMUSCULAR | Status: DC | PRN
  Administered 2016-03-04: 1 mg via INTRAVENOUS

## 2016-03-04 MED ORDER — SODIUM CHLORIDE 0.9% FLUSH
3.0000 mL | Freq: Two times a day (BID) | INTRAVENOUS | Status: DC
  Administered 2016-03-04 (×2): 3 mL via INTRAVENOUS

## 2016-03-04 MED ORDER — FENTANYL CITRATE (PF) 100 MCG/2ML IJ SOLN
INTRAMUSCULAR | Status: AC
  Filled 2016-03-04: qty 2

## 2016-03-04 MED ORDER — SODIUM CHLORIDE 0.9 % IV SOLN: INTRAVENOUS | Status: AC

## 2016-03-04 MED ORDER — SODIUM CHLORIDE 0.9 % IV SOLN: 250.0000 mL | INTRAVENOUS | Status: DC | PRN

## 2016-03-04 MED ORDER — LIDOCAINE HCL (PF) 1 % IJ SOLN
INTRAMUSCULAR | Status: DC | PRN
  Administered 2016-03-04: 20 mL

## 2016-03-04 MED ORDER — SODIUM CHLORIDE 0.9% FLUSH: 3.0000 mL | INTRAVENOUS | Status: DC | PRN

## 2016-03-04 MED ORDER — DEXTROSE 50 % IV SOLN
INTRAVENOUS | Status: AC
  Filled 2016-03-04: qty 50

## 2016-03-04 MED ORDER — HEPARIN (PORCINE) IN NACL 2-0.9 UNIT/ML-% IJ SOLN
INTRAMUSCULAR | Status: AC
  Filled 2016-03-04: qty 1000

## 2016-03-04 MED ORDER — HYDRALAZINE HCL 20 MG/ML IJ SOLN
INTRAMUSCULAR | Status: AC
  Filled 2016-03-04: qty 1

## 2016-03-04 MED ORDER — PREDNISOLONE ACETATE 1 % OP SUSP
1.0000 [drp] | Freq: Every day | OPHTHALMIC | Status: DC
  Administered 2016-03-05: 1 [drp] via OPHTHALMIC
  Filled 2016-03-04: qty 1

## 2016-03-04 MED ORDER — HYDRALAZINE HCL 20 MG/ML IJ SOLN
INTRAMUSCULAR | Status: DC | PRN
  Administered 2016-03-04: 20 mg via INTRAVENOUS

## 2016-03-04 MED ORDER — DEXTROSE 50 % IV SOLN
25.0000 mL | Freq: Once | INTRAVENOUS | Status: AC
  Administered 2016-03-04: 25 mL via INTRAVENOUS

## 2016-03-04 MED ORDER — MIDAZOLAM HCL 2 MG/2ML IJ SOLN
INTRAMUSCULAR | Status: AC
  Filled 2016-03-04: qty 2

## 2016-03-04 SURGICAL SUPPLY — 7 items
CATH INFINITI 5FR MULTPACK ANG (CATHETERS) ×3 IMPLANT
KIT HEART LEFT (KITS) ×3 IMPLANT
PACK CARDIAC CATHETERIZATION (CUSTOM PROCEDURE TRAY) ×3 IMPLANT
SHEATH PINNACLE 5F 10CM (SHEATH) ×3 IMPLANT
TRANSDUCER W/STOPCOCK (MISCELLANEOUS) ×3 IMPLANT
TUBING CIL FLEX 10 FLL-RA (TUBING) ×3 IMPLANT
WIRE EMERALD 3MM-J .035X150CM (WIRE) ×3 IMPLANT

## 2016-03-04 NOTE — Progress Notes (Signed)
Removal of Right Femoral sheath without complication. Manual compression applied for 20 minutes. Distal pulses unchanged from prior assessment. Sterile dressing applied to puncture site with sterile 4x4 and tegaderm to secure. Dressing dry and intact. BP: 116/44, sinus bradycardia 44, SP02: 100%

## 2016-03-04 NOTE — Progress Notes (Signed)
Glucose low twice, hold lantus, pt DM-2, another half bolus D50 and change IV fluids to D5.45 at 50cc.per hour.  For cath at 9 A.  Recheck glucose.

## 2016-03-04 NOTE — Progress Notes (Signed)
Pt blood glucose is low again, MD notified new order received.

## 2016-03-04 NOTE — Progress Notes (Signed)
Pt refused to take the 40 units Lantus, MD notified new order received.

## 2016-03-04 NOTE — Consult Note (Signed)
Referring Provider: No ref. provider found Primary Care Physician:  Arnoldo Morale, MD Primary Nephrologist:  Dr. 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 Moshe Cipro. He was recently seen by Dr. Donzetta Matters with [facility] for evaluation for AV fistula placement. Russell Price is a 55 y.o. male with a history of  HTN, HLD, CVA, DMT2, and ESRD with plans for HD who presents to clinic for pre op clearance prior to AV fistula placement.   Past Medical History:  Diagnosis Date  . Chronic kidney disease   . Diabetes mellitus without complication ([place])    followed by Dr Chalmers Cater  . Hypertension    followed by [facility]    Past Surgical History:  Procedure Laterality Date  . BRONCHOSCOPY  02/14/2015   for pulm hemorrhage    Prior to Admission medications   Medication Sig Start Date End Date Taking? Authorizing Provider  aspirin 81 MG chewable tablet Chew 1 tablet (81 mg total) by mouth daily. 09/14/15  Yes Daniel J Angiulli, PA-C  atorvastatin (LIPITOR) 40 MG tablet Take 1 tablet (40 mg total) by mouth daily at 6 PM. 02/07/16  Yes Arnoldo Morale, MD  Blood Glucose Monitoring Suppl (ACCU-CHEK AVIVA) device Use as instructed 3 times daily before meals and at bedtime. 12/18/15  Yes Arnoldo Morale, MD  chlorthalidone (HYGROTON) 25 MG tablet Take 1 tablet (25 mg total) by mouth daily. 02/07/16  Yes Arnoldo Morale, MD  glucose blood (ACCU-CHEK AVIVA PLUS) test strip Use 3 times daily before meals and at bedtime 12/18/15  Yes Arnoldo Morale, MD  insulin aspart (NOVOLOG) 100 UNIT/ML injection Inject 0-12 Units into the skin 3 (three) times daily before meals. 02/07/16  Yes Arnoldo Morale, MD  Lancet Devices [facility]) lancets Use as instructed 3 times daily before meals and at bedtime 12/18/15  Yes  Arnoldo Morale, MD  NIFEdipine (ADALAT CC) 90 MG 24 hr tablet Take 1 tablet (90 mg total) by mouth daily. 02/07/16  Yes Arnoldo Morale, MD  b complex-vitamin c-folic acid (NEPHRO-VITE) 0.8 MG TABS tablet Take 1 tablet by mouth at bedtime.    Historical Provider, MD  carvedilol (COREG) 3.125 MG tablet Take 1 tablet (3.125 mg total) by mouth 2 (two) times daily with a meal. 02/07/16   Arnoldo Morale, MD  insulin glargine (LANTUS) 100 UNIT/ML injection Inject 40 Units into the skin daily.    Historical Provider, MD    Current Facility-Administered Medications  Medication Dose Route Frequency Provider Last Rate Last Dose  . 0.9 %  sodium chloride infusion  250 mL Intravenous PRN Leonie Man, MD      . 0.9 %  sodium chloride infusion   Intravenous Continuous Leonie Man, MD 100 mL/hr at 03/04/16 1053 400 mL at 03/04/16 1053  . acetaminophen (TYLENOL) tablet 650 mg  650 mg Oral Q4H PRN Cheryln Manly, NP      . aspirin EC tablet 81 mg  81 mg Oral Daily Cheryln Manly, NP      . atorvastatin (LIPITOR) tablet 40 mg  40 mg Oral q1800 Cheryln Manly, NP      . carvedilol (COREG) tablet 3.125 mg  3.125 mg Oral BID WC Cheryln Manly, NP   3.125 mg at 03/04/16 0745  . dextrose 5 %-0.45 % sodium  chloride infusion   Intravenous Continuous Isaiah Serge, NP      . dextrose 5 %-0.45 % sodium chloride infusion   Intravenous Continuous Isaiah Serge, NP 50 mL/hr at 03/04/16 0631    . dextrose 50 % solution           . heparin injection 5,000 Units  5,000 Units Subcutaneous Q8H Cheryln Manly, NP   5,000 Units at 03/04/16 2025  . hydrALAZINE (APRESOLINE) injection 20 mg  20 mg Intravenous Q4H PRN Leonie Man, MD      . Influenza vac split quadrivalent PF (FLUARIX) injection 0.5 mL  0.5 mL Intramuscular Tomorrow-1000 Thompson Grayer, MD      . insulin aspart (novoLOG) injection 0-15 Units  0-15 Units Subcutaneous TID WC Cheryln Manly, NP      . insulin glargine (LANTUS) injection 40 Units  40  Units Subcutaneous Daily Cheryln Manly, NP      . NIFEdipine (PROCARDIA-XL/ADALAT-CC/NIFEDICAL-XL) 24 hr tablet 90 mg  90 mg Oral Daily Cheryln Manly, NP      . nitroGLYCERIN (NITROSTAT) SL tablet 0.4 mg  0.4 mg Sublingual Q5 Min x 3 PRN Cheryln Manly, NP      . ondansetron [facility]) injection 4 mg  4 mg Intravenous Q6H PRN Cheryln Manly, NP      . pneumococcal 23 valent vaccine (PNU-IMMUNE) injection 0.5 mL  0.5 mL Intramuscular Tomorrow-1000 Thompson Grayer, MD      . prednisoLONE acetate (PRED FORTE) 1 % ophthalmic suspension 1 drop  1 drop Left Eye Daily Isaiah Serge, NP      . sodium chloride flush (NS) 0.9 % injection 3 mL  3 mL Intravenous Q12H Leonie Man, MD      . sodium chloride flush (NS) 0.9 % injection 3 mL  3 mL Intravenous PRN Leonie Man, MD        Allergies as of 02/23/2016  . (No Known Allergies)    Family History  Problem Relation Age of Onset  . Diabetes Mother     Social History   Social History  . Marital status: Single    Spouse name: N/A  . Number of children: N/A  . Years of education: N/A   Occupational History  . Not on file.   Social History Main Topics  . Smoking status: Former Smoker    Packs/day: 0.00    Years: 4.00    Quit date: 02/23/1983  . Smokeless tobacco: Never Used  . Alcohol use No  . Drug use: No  . Sexual activity: Not on file   Other Topics Concern  . Not on file   Social History Narrative  . No narrative on file    Review of Systems: Gen: Denies any fever, chills, sweats, anorexia, fatigue, weakness, malaise, weight loss, and sleep disorder HEENT: . Normal external appearance No Epistaxis or Sore throat. No sinusitis.  Glaucoma right eye CV: Denies chest pain, angina, palpitations, syncope, orthopnea, PND, peripheral edema, and claudication.diastolic CHF, NSTEMI Resp: Denies dyspnea at rest, dyspnea with exercise, cough, sputum, wheezing, coughing up blood, and pleurisy. GI: Denies vomiting blood,  jaundice, and fecal incontinence.   Denies dysphagia or odynophagia. GU : Denies urinary burning, blood in urine, urinary frequency, urinary hesitancy, nocturnal urination, and urinary incontinence.  No renal calculi. MS: Denies joint pain, limitation of movement, and swelling, stiffness, low back pain, extremity pain. Denies muscle weakness, cramps, atrophy.  No use of non steroidal antiinflammatory drugs.  Derm: Denies rash, itching, dry skin, hives, moles, warts, or unhealing ulcers.  Psych: Denies depression, anxiety, memory loss, suicidal ideation, hallucinations, paranoia, and confusion. Heme: Denies bruising, bleeding, and enlarged lymph nodes. Neuro: No headache.  No diplopia. No dysarthria.  No dysphasia.  No history of CVA.  No Seizures. No paresthesias.  No weakness. Endocrine +  DM.  No Thyroid disease.  No Adrenal disease.  Physical Exam: Vital signs in last 24 hours: Temp:  [97.5 F (36.4 C)-98.4 F (36.9 C)] 98.3 F (36.8 C) (10/16 1116) Pulse Rate:  [0-163] 61 (10/16 1116) Resp:  [0-20] 15 (10/16 1116) BP: (129-204)/(53-97) 129/53 (10/16 1116) SpO2:  [0 %-100 %] 100 % (10/16 1116) Weight:  [77.3 kg (170 lb 6.7 oz)] 77.3 kg (170 lb 6.7 oz) (10/15 1733) Last BM Date: 03/02/16 General:   Alert,  Well-developed, well-nourished, pleasant and cooperative in NAD Head:  Normocephalic and atraumatic. Eyes:  Sclera clear, no icterus.   Conjunctiva pink. Ears:  Normal auditory acuity. Nose:  No deformity, discharge,  or lesions. Mouth:  No deformity or lesions, dentition normal. Neck:  Supple; no masses or thyromegaly. JVP not elevated Lungs:  Clear throughout to auscultation.   No wheezes, crackles, or rhonchi. No acute distress. Heart:  Regular rate and rhythm; no murmurs, clicks, rubs,  or gallops. Abdomen:  Soft, nontender and nondistended. No masses, hepatosplenomegaly or hernias noted. Normal bowel sounds, without guarding, and without rebound.   Msk:  Symmetrical without  gross deformities. Normal posture. Pulses:  No carotid, renal, femoral bruits. DP and PT symmetrical and equal Extremities:  Without clubbing or edema. Neurologic:  Alert and  oriented x4;  grossly normal neurologically. Skin:  Intact without significant lesions or rashes. Cervical Nodes:  No significant cervical adenopathy. Psych:  Alert and cooperative. Normal mood and affect.  Intake/Output from previous day: 10/15 0701 - 10/16 0700 In: 1003.9 [P.O.:240; I.V.:763.9] Out: -  Intake/Output this shift: No intake/output data recorded.  Lab Results:  Recent Labs  03/03/16 1610  WBC 5.1  HGB 10.4*  HCT 30.5*  PLT 125*   BMET  Recent Labs  03/03/16 1610 03/04/16 0230  NA 139 142  K 5.0 4.2  CL 112* 115*  CO2 20* 20*  GLUCOSE 157* 40*  BUN 86* 80*  CREATININE 4.47* 4.19*  CALCIUM 9.0 8.9   LFT  Recent Labs  03/03/16 1610  PROT 6.9  ALBUMIN 3.5  AST 15  ALT 32  ALKPHOS 94  BILITOT 0.2*   PT/INR No results for input(s): LABPROT, INR in the last 72 hours. Hepatitis Panel No results for input(s): HEPBSAG, HCVAB, HEPAIGM, HEPBIGM in the last 72 hours.  Studies/Results: No results found.  Assessment/Plan:  CKD stage 4/5  High risk for surgery and admitted for cardiac cath and evaluation prior to placement of AVF  HTN controlled  Anemia stable  Bones no VDA  CAD  S/p cath    LOS: 0 Mariko Nowakowski W @TODAY @11 :29 AM

## 2016-03-04 NOTE — Progress Notes (Signed)
   Briefly spoke with patient today Will f/u results of cath and plan dialysis access accordingly.  Alyanah Elliott C. Donzetta Matters, MD [facility] Office: [phone] Pager: [phone]

## 2016-03-04 NOTE — Progress Notes (Signed)
   2 vessel CAD, no PCI Will keep today to watch creatinine tomorrow AM Discussed with Dr. Justin Mend. May proceed with AV fistula placement with Dr. Donzetta Matters of [facility]   Candee Furbish, MD

## 2016-03-04 NOTE — Progress Notes (Signed)
Inpatient Diabetes Program Recommendations  AACE/ADA: New Consensus Statement on Inpatient Glycemic Control (2015)  Target Ranges:  Prepandial:   less than 140 mg/dL      Peak postprandial:   less than 180 mg/dL (1-2 hours)      Critically ill patients:  140 - 180 mg/dL   Lab Results  Component Value Date   GLUCAP 110 (H) 03/04/2016   HGBA1C 8.5 02/07/2016    Review of Glycemic Control  Diabetes history: DM2 Outpatient Diabetes medications: Lantus 40 units + Novolog 0-12 units tid with meals Current orders for Inpatient glycemic control: Lantus 40 units (on hold) + Novolog correction 0-15 units tid  Inpatient Diabetes Program Recommendations:  Noted Lantus on hold due to hypoglycemia on reduced dose of Lantus. Please consider: -decrease in Lantus to 10 units daily while in the hospital and -decrease Novolog to sensitive 0-9 units tid  Thank you, Russell Price. Russell Erck, RN, MSN, CDE Inpatient Glycemic Control Team Team Pager [phone] (8am-5pm) 03/04/2016 4:00 PM

## 2016-03-04 NOTE — Progress Notes (Signed)
   Off to cath lab.   Candee Furbish, MD

## 2016-03-04 NOTE — Progress Notes (Signed)
Hypoglycemic Event  CBG:32  Treatment: 35 ml D50 Symptoms: Glazed eyes, unable to give name and date of birth  Follow-up CBG: Time:0353 CBG Result:131  Possible Reasons for Event: pt is NPO for a cath in the morning and had received 20 units of Lantus at bed time  Comments/MD notified:will notified MD   Lajean Saver

## 2016-03-04 NOTE — Interval H&P Note (Signed)
History and Physical Interval Note:  03/04/2016 9:48 AM  Russell Price  has presented today for surgery, with the diagnosis of abnormal stress test  The various methods of treatment have been discussed with the patient and family. After consideration of risks, benefits and other options for treatment, the patient has consented to  Procedure(s): Left Heart Cath and Coronary Angiography (N/A) as a surgical intervention .  The patient's history has been reviewed, patient examined, no change in status, stable for surgery.  I have reviewed the patient's chart and labs.  Questions were answered to the patient's satisfaction.     Glenetta Hew

## 2016-03-04 NOTE — Progress Notes (Signed)
Patients blood pressure remains high 173/68 after PRN Hydralazine 20 mg  and scheduled Coreg 3.125mg  was given. Paged Dr. Oval Linsey. New order placed, Coreg 6.25 mg. Will continue to monitor.  Terrin Meddaugh, RN

## 2016-03-04 NOTE — H&P (View-Only) (Signed)
   Off to cath lab.   Candee Furbish, MD

## 2016-03-05 ENCOUNTER — Other Ambulatory Visit: Payer: Self-pay

## 2016-03-05 ENCOUNTER — Encounter (HOSPITAL_COMMUNITY): Payer: Self-pay | Admitting: Cardiology

## 2016-03-05 DIAGNOSIS — R9439 Abnormal result of other cardiovascular function study: Secondary | ICD-10-CM | POA: Diagnosis not present

## 2016-03-05 DIAGNOSIS — I2510 Atherosclerotic heart disease of native coronary artery without angina pectoris: Secondary | ICD-10-CM

## 2016-03-05 DIAGNOSIS — N184 Chronic kidney disease, stage 4 (severe): Secondary | ICD-10-CM | POA: Diagnosis not present

## 2016-03-05 DIAGNOSIS — Z01818 Encounter for other preprocedural examination: Secondary | ICD-10-CM | POA: Diagnosis not present

## 2016-03-05 LAB — BASIC METABOLIC PANEL
ANION GAP: 7 (ref 5–15)
BUN: 79 mg/dL — AB (ref 6–20)
CHLORIDE: 112 mmol/L — AB (ref 101–111)
CO2: 19 mmol/L — ABNORMAL LOW (ref 22–32)
Calcium: 8.5 mg/dL — ABNORMAL LOW (ref 8.9–10.3)
Creatinine, Ser: 4.64 mg/dL — ABNORMAL HIGH (ref 0.61–1.24)
GFR calc Af Amer: 15 mL/min — ABNORMAL LOW (ref >60)
GFR calc non Af Amer: 13 mL/min — ABNORMAL LOW (ref >60)
GLUCOSE: 196 mg/dL — AB (ref 65–99)
POTASSIUM: 4.9 mmol/L (ref 3.5–5.1)
Sodium: 138 mmol/L (ref 135–145)

## 2016-03-05 LAB — GLUCOSE, CAPILLARY
Glucose-Capillary: 153 mg/dL — ABNORMAL HIGH (ref 65–99)
Glucose-Capillary: 163 mg/dL — ABNORMAL HIGH (ref 65–99)
Glucose-Capillary: 169 mg/dL — ABNORMAL HIGH (ref 65–99)

## 2016-03-05 MED ORDER — INFLUENZA VAC SPLIT QUAD 0.5 ML IM SUSY
0.5000 mL | PREFILLED_SYRINGE | Freq: Once | INTRAMUSCULAR | Status: AC
  Administered 2016-03-05: 0.5 mL via INTRAMUSCULAR
  Filled 2016-03-05: qty 0.5

## 2016-03-05 MED ORDER — CARVEDILOL 6.25 MG PO TABS
6.2500 mg | ORAL_TABLET | Freq: Two times a day (BID) | ORAL | Status: DC
  Administered 2016-03-05: 6.25 mg via ORAL
  Filled 2016-03-05: qty 2

## 2016-03-05 MED ORDER — INSULIN GLARGINE 100 UNIT/ML ~~LOC~~ SOLN
10.0000 [IU] | Freq: Every day | SUBCUTANEOUS | Status: DC
  Administered 2016-03-05: 10 [IU] via SUBCUTANEOUS
  Filled 2016-03-05: qty 0.1

## 2016-03-05 MED ORDER — CARVEDILOL 6.25 MG PO TABS: 6.2500 mg | ORAL_TABLET | Freq: Two times a day (BID) | ORAL | 6 refills | Status: DC

## 2016-03-05 MED ORDER — INSULIN GLARGINE 100 UNIT/ML ~~LOC~~ SOLN: 10.0000 [IU] | Freq: Every day | SUBCUTANEOUS | Status: DC

## 2016-03-05 MED FILL — CARVEDILOL 6.25 MG TABLET: 6.25 | 30 days supply | Qty: 60 | Fill #0

## 2016-03-05 NOTE — Progress Notes (Addendum)
Patient in a stable position, this RN completed discharge teachings with patient and wife at bedside, they verbalised understanding , iv removed , tele dc ccmd notified, patient taken off the unit on a wheelchair by a hospital volunteer

## 2016-03-05 NOTE — Progress Notes (Signed)
Verbal order received from Vanice Sarah MD  for Lantus injection 10 units daily. Will continue to monitor

## 2016-03-05 NOTE — Progress Notes (Signed)
   Noted cath findings from yesterday Will schedule left arm avf vs avg as outpatient Discussed with patient and wife expected outcomes from surgery.  Siyah Mault C. Donzetta Matters, MD [facility] Office: [phone] Pager: [phone]

## 2016-03-05 NOTE — Discharge Instructions (Signed)

## 2016-03-05 NOTE — Progress Notes (Signed)
Patient Name: Russell Price Date of Encounter: 03/05/2016  Primary Cardiologist: Dr. Delila Pereyra Problem List     Principal Problem:   Abnormal nuclear stress test Active Problems:   Pre-operative clearance   Coronary artery calcification seen on CAT scan   Chronic renal insufficiency, stage IV (severe) (HCC)     Subjective   Feels well, no CP, no SOB  Inpatient Medications    Scheduled Meds: . aspirin EC  81 mg Oral Daily  . atorvastatin  40 mg Oral q1800  . carvedilol  3.125 mg Oral BID WC  . heparin  5,000 Units Subcutaneous Q8H  . Influenza vac split quadrivalent PF  0.5 mL Intramuscular Tomorrow-1000  . insulin aspart  0-15 Units Subcutaneous TID WC  . insulin glargine  40 Units Subcutaneous Daily  . NIFEdipine  90 mg Oral Daily  . pneumococcal 23 valent vaccine  0.5 mL Intramuscular Tomorrow-1000  . prednisoLONE acetate  1 drop Left Eye Daily  . sodium chloride flush  3 mL Intravenous Q12H   Continuous Infusions: . dextrose 5 % and 0.45% NaCl    . dextrose 5 % and 0.45% NaCl Stopped (03/04/16 1500)   PRN Meds: sodium chloride, acetaminophen, hydrALAZINE, nitroGLYCERIN, ondansetron (ZOFRAN) IV, sodium chloride flush   Vital Signs    Vitals:   03/04/16 1742 03/04/16 1950 03/05/16 0522 03/05/16 0750  BP: (!) 173/68 (!) 155/53 (!) 164/64 (!) 162/63  Pulse: 84 76 65 65  Resp:    18  Temp:  99.1 F (37.3 C) 98.4 F (36.9 C) 98.4 F (36.9 C)  TempSrc:  Oral Oral Oral  SpO2: 100% 100% 100% 100%  Weight:      Height:        Intake/Output Summary (Last 24 hours) at 03/05/16 0826 Last data filed at 03/04/16 1842  Gross per 24 hour  Intake              220 ml  Output                0 ml  Net              220 ml   Filed Weights   03/03/16 1529 03/03/16 1733  Weight: 170 lb 6.7 oz (77.3 kg) 170 lb 6.7 oz (77.3 kg)    Physical Exam    GEN: Well nourished, well developed, in no acute distress.  HEENT: Grossly normal.  Neck: Supple, no  JVD, carotid bruits, or masses. Cardiac: RRR, no murmurs, rubs, or gallops. No clubbing, cyanosis, edema.  Radials/DP/PT 2+ and equal bilaterally.  Respiratory:  Respirations regular and unlabored, clear to auscultation bilaterally. GI: Soft, nontender, nondistended, BS + x 4. MS: no deformity or atrophy. Skin: warm and dry, no rash. Neuro:  Strength and sensation are intact. Psych: AAOx3.  Normal affect.  Labs    CBC  Recent Labs  03/03/16 1610  WBC 5.1  NEUTROABS 2.9  HGB 10.4*  HCT 30.5*  MCV 80.9  PLT 277*   Basic Metabolic Panel  Recent Labs  03/03/16 1610 03/04/16 0230  NA 139 142  K 5.0 4.2  CL 112* 115*  CO2 20* 20*  GLUCOSE 157* 40*  BUN 86* 80*  CREATININE 4.47* 4.19*  CALCIUM 9.0 8.9   Liver Function Tests  Recent Labs  03/03/16 1610  AST 15  ALT 32  ALKPHOS 94  BILITOT 0.2*  PROT 6.9  ALBUMIN 3.5     Telemetry    SB, no  adverse arrhythmia - Personally Reviewed  ECG    SB 55, LVH - Personally Reviewed  Radiology    No results found.  Cardiac Studies   Cath 03/04/16:  Mid RCA lesion, 65 %stenosed. Dist RCA lesion, 85 %stenosed. Relatively small caliber vessel. Smaller diameter than 5 French catheter  Prox LAD lesion, 45 %stenosed. Mid LAD lesion, 35 %stenosed.  3rd Mrg lesion, 70 %stenosed.  LV end diastolic pressure is moderately elevated.    Patient does have severe 2 vessel disease involving the RCA and OM 3. Both vessels are relatively small in caliber and not good PCI targets. She was severely hypertensive with blood pressures 230/110 upon arrival the Cath Lab. After sedation hydralazine 20 mg the pressures went down into the 024O systolic.  Plan:  Return to the nursing unit for continued IV hydration and monitoring of his glucose status.  Consider discharge later today if stable versus in the morning.  When necessary hydralazine ordered for hypertension\  Would need additional blood pressure management for home  therapy.  As for preoperative evaluation, I would not consider PCI prior to his AV fistula procedure.    Glenetta Hew, M.D., M.S.  Patient Profile       Russell Price is a 55 y.o. male with a history of  HTN, HLD, CVA, DMT2, and ESRD with plans for HD who presented to clinic for pre op clearance prior to AV fistula placement.   Assessment & Plan    Pre-op cardiovascular exam  - CAD, 2 vessel in RCA and OM3. Medical mgt. No PCI.  - Moderate risk to proceed with AV fistula   - Dr. Justin Mend aware.  - Awaiting BMET this AM. As long as not significantly elevated, OK to DC.   Hypertension  - longstanding  - increasing coreg to 6.25BID  Work note for his wife for yesterday and today.   Signed, Candee Furbish, MD  03/05/2016, 8:26 AM

## 2016-03-05 NOTE — Discharge Summary (Signed)
Discharge Summary    Patient ID: Russell Price,  MRN: [id number], DOB/AGE: [birth year] 55 y.o.  Admit date: 03/03/2016 Discharge date: 03/05/2016  Primary Care Provider: Arnoldo Morale Primary Cardiologist: Dr. Sallyanne Kuster    Discharge Diagnoses    Principal Problem:   Abnormal nuclear stress test Active Problems:   Essential hypertension   DM (diabetes mellitus) with complications ([place])   Cerebral thrombosis with cerebral infarction ([place])   Diabetes mellitus type 2 in nonobese ([place])   Hyperlipidemia   Chronic renal insufficiency, stage IV (severe) (HCC)   CAD (coronary artery disease)   Allergies Allergies  Allergen Reactions  . No Known Allergies      History of Present Illness     Russell Price is a 55 y.o. male with a history of  HTN, HLD, CVA, DMT2, and ESRD with plans for HD who presented to [facility] for planned cardiac cath with pre hydration.   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 Moshe Cipro. He was recently seen by Dr. Donzetta Matters with [facility] for evaluation for AV fistula placement.   He was admitted to [facility] in 02/2015 for acute resp failure, hemoptysis requiring intubation. EF 45% on initial echo, improved to 55% later. Trop trended up to 12. Found to have bilateral pulmonary (alveolar) hemorrhage, acute diastolic CHF, NSTEMI, and acute right thalamic CVA. He underwent nuclear stress test during this admission which was read as high risk. Dr. Sallyanne Kuster reviewed the images. He felt the study was more consistent with low risk, inferior wall ischemia. Patient was felt to not be a good candidate for cath due to elevated creat, recent CVA and pulmonary hemorrhage. Cath not felt to be indicated at that time and he was continued on medical therapy.  He was admitted to [facility] in 12/1273 for DKA complicated by acute on chronic renal failure and acute respiratory failure requiring ventilatory support. 2D  ECHO at that time showed normal LV function with Inferobasal hypokinesis, mild LVH, no RWMAS, mild LAE.   I saw him in the office on 03/01/16 for pre op clearance prior to AV fistula placement. I discussed his case with Dr. Saunders Revel and we felt that he required a heart cath prior to surgical clearance given his recent high risk nuclear stress test. Given his ESRD nearing HD, we decided to admit his the night before for pre hydration.    Hospital Course     Consultants: nephrology   CAD: cath showed 2 vessel CAD in RCA and OM3, with no good targets for PCI. Medical management recommended. Cleared from a cardiac standpoint for AV fistula placement with at least moderate risk for cardiac complications. Continue ASA, statin and BB.   HTN: BP has been uncontrolled and Coreg increased to 6.25mg  BID. He did require IV hydralazine while admitted.   DMT2: uncontrolled. HgA1c 10.1.  ESRD: approaching HD. He was admitted for pre hydration. Nephrology was consulted. His creat has remained stable 24 hours post cath (4.19-->4.64). He has been cleared for discharge home and AV fistula placement.   Hx of CVA: continue ASA and statin  HLD: continue statin    The patient has had an uncomplicated hospital course and is recovering well. The femoral catheter site is stable. He has been seen by Dr. Marlou Porch today and deemed ready for discharge home. Staff messages for all follow-up appointments have been sent. A work excuse note was provided for his  wife as well. Discharge medications are listed below.  _____________  Discharge Vitals Blood pressure (!) 164/65, pulse 65, temperature 98.4 F (36.9 C), temperature source Oral, resp. rate 18, height 5\' 11"  (1.803 m), weight 170 lb 6.7 oz (77.3 kg), SpO2 100 %.  Filed Weights   03/03/16 1529 03/03/16 1733  Weight: 170 lb 6.7 oz (77.3 kg) 170 lb 6.7 oz (77.3 kg)    Labs & Radiologic Studies     CBC  Recent Labs  03/03/16 1610  WBC 5.1  NEUTROABS 2.9    HGB 10.4*  HCT 30.5*  MCV 80.9  PLT 580*   Basic Metabolic Panel  Recent Labs  03/04/16 0230 03/05/16 1000  NA 142 138  K 4.2 4.9  CL 115* 112*  CO2 20* 19*  GLUCOSE 40* 196*  BUN 80* 79*  CREATININE 4.19* 4.64*  CALCIUM 8.9 8.5*   Liver Function Tests  Recent Labs  03/03/16 1610  AST 15  ALT 32  ALKPHOS 94  BILITOT 0.2*  PROT 6.9  ALBUMIN 3.5   No results for input(s): LIPASE, AMYLASE in the last 72 hours. Cardiac Enzymes No results for input(s): CKTOTAL, CKMB, CKMBINDEX, TROPONINI in the last 72 hours. BNP Invalid input(s): POCBNP D-Dimer No results for input(s): DDIMER in the last 72 hours. Hemoglobin A1C No results for input(s): HGBA1C in the last 72 hours. Fasting Lipid Panel No results for input(s): CHOL, HDL, LDLCALC, TRIG, CHOLHDL, LDLDIRECT in the last 72 hours. Thyroid Function Tests No results for input(s): TSH, T4TOTAL, T3FREE, THYROIDAB in the last 72 hours.  Invalid input(s): FREET3  No results found.   Diagnostic Studies/Procedures    Cath 03/04/16:  Mid RCA lesion, 65 %stenosed. Dist RCA lesion, 85 %stenosed. Relatively small caliber vessel. Smaller diameter than 5 French catheter  Prox LAD lesion, 45 %stenosed. Mid LAD lesion, 35 %stenosed.  3rd Mrg lesion, 70 %stenosed.  LV end diastolic pressure is moderately elevated.   Patient does have severe 2 vessel disease involving the RCA and OM 3. Both vessels are relatively small in caliber and not good PCI targets. She was severely hypertensive with blood pressures 230/110 upon arrival the Cath Lab. After sedation hydralazine 20 mg the pressures went down into the 998P systolic.  Plan:  Return to the nursing unit for continued IV hydration and monitoring of his glucose status.  Consider discharge later today if stable versus in the morning.  When necessary hydralazine ordered for hypertension\  Would need additional blood pressure management for home therapy.  As for  preoperative evaluation, I would not consider PCI prior to his AV fistula procedure. _____________    Disposition   Pt is being discharged home today in good condition.  Follow-up Plans & Appointments    Follow-up [place], MD .   Specialty:  Nephrology Why:  Plese follow up with your kidney dr in the next 1-2 weeks Contact information: [place] [address] [phone]        Servando Snare, MD .   Specialties:  Vascular Surgery, Cardiology Why:  Dr. Claretha Cooper office will call you to schedule AV fistual surgery Contact information: [place] [address] [phone]        Sanda Klein, MD .   Specialty:  Cardiology Why:  The office will call you to be seen in 6 months.  Contact information: [address] [place] [address] [phone]  Discharge Medications     Medication List    TAKE these medications   ACCU-CHEK AVIVA device Use as instructed 3 times daily before meals and at bedtime.   accu-chek softclix lancets Use as instructed 3 times daily before meals and at bedtime   aspirin 81 MG chewable tablet Chew 1 tablet (81 mg total) by mouth daily.   atorvastatin 40 MG tablet Commonly known as:  LIPITOR Take 1 tablet (40 mg total) by mouth daily at 6 PM.   b complex-vitamin c-folic acid 0.8 MG Tabs tablet Take 1 tablet by mouth at bedtime.   carvedilol 6.25 MG tablet Commonly known as:  COREG Take 1 tablet (6.25 mg total) by mouth 2 (two) times daily with a meal. What changed:  medication strength  how much to take   chlorthalidone 25 MG tablet Commonly known as:  HYGROTON Take 1 tablet (25 mg total) by mouth daily.   glucose blood test strip Commonly known as:  ACCU-CHEK AVIVA PLUS Use 3 times daily before meals and at bedtime   insulin aspart 100 UNIT/ML injection Commonly known as:  novoLOG Inject 0-12 Units into the skin 3 (three) times daily  before meals.   insulin glargine 100 UNIT/ML injection Commonly known as:  LANTUS Inject 40 Units into the skin daily.   NIFEdipine 90 MG 24 hr tablet Commonly known as:  ADALAT CC Take 1 tablet (90 mg total) by mouth daily.         Outstanding Labs/Studies   AV fistula placement  Duration of Discharge Encounter   Greater than 30 minutes including physician time.  Signed, Angelena Form PA-C 03/05/2016, 11:05 AM  Personally seen and examined. Agree with above. Primary Cardiologist: Dr. [facility] Problem List     Principal Problem:   Abnormal nuclear stress test Active Problems:   Pre-operative clearance   Coronary artery calcification seen on CAT scan   Chronic renal insufficiency, stage IV (severe) (HCC)     Subjective   Feels well, no CP, no SOB  Inpatient Medications    Scheduled Meds: . aspirin EC  81 mg Oral Daily  . atorvastatin  40 mg Oral q1800  . carvedilol  3.125 mg Oral BID WC  . heparin  5,000 Units Subcutaneous Q8H  . Influenza vac split quadrivalent PF  0.5 mL Intramuscular Tomorrow-1000  . insulin aspart  0-15 Units Subcutaneous TID WC  . insulin glargine  40 Units Subcutaneous Daily  . NIFEdipine  90 mg Oral Daily  . pneumococcal 23 valent vaccine  0.5 mL Intramuscular Tomorrow-1000  . prednisoLONE acetate  1 drop Left Eye Daily  . sodium chloride flush  3 mL Intravenous Q12H   Continuous Infusions: . dextrose 5 % and 0.45% NaCl    . dextrose 5 % and 0.45% NaCl Stopped (03/04/16 1500)   PRN Meds: sodium chloride, acetaminophen, hydrALAZINE, nitroGLYCERIN, ondansetron (ZOFRAN) IV, sodium chloride flush   Vital Signs          Vitals:   03/04/16 1742 03/04/16 1950 03/05/16 0522 03/05/16 0750  BP: (!) 173/68 (!) 155/53 (!) 164/64 (!) 162/63  Pulse: 84 76 65 65  Resp:    18  Temp:  99.1 F (37.3 C) 98.4 F (36.9 C) 98.4 F (36.9 C)  TempSrc:  Oral Oral Oral  SpO2: 100% 100% 100% 100%  Weight:        Height:        Intake/Output Summary (Last 24 hours) at 03/05/16 9924 Last data filed  at 03/04/16 1842  Gross per 24 hour  Intake              220 ml  Output                0 ml  Net              220 ml       Filed Weights   03/03/16 1529 03/03/16 1733  Weight: 170 lb 6.7 oz (77.3 kg) 170 lb 6.7 oz (77.3 kg)    Physical Exam    GEN: Well nourished, well developed, in no acute distress.  HEENT: Grossly normal.  Neck: Supple, no JVD, carotid bruits, or masses. Cardiac: RRR, no murmurs, rubs, or gallops. No clubbing, cyanosis, edema.  Radials/DP/PT 2+ and equal bilaterally.  Respiratory:  Respirations regular and unlabored, clear to auscultation bilaterally. GI: Soft, nontender, nondistended, BS + x 4. MS: no deformity or atrophy. Skin: warm and dry, no rash. Neuro:  Strength and sensation are intact. Psych: AAOx3.  Normal affect.  Labs    CBC  Recent Labs (last 2 labs)    Recent Labs  03/03/16 1610  WBC 5.1  NEUTROABS 2.9  HGB 10.4*  HCT 30.5*  MCV 80.9  PLT 125*     Basic Metabolic Panel  Recent Labs (last 2 labs)    Recent Labs  03/03/16 1610 03/04/16 0230  NA 139 142  K 5.0 4.2  CL 112* 115*  CO2 20* 20*  GLUCOSE 157* 40*  BUN 86* 80*  CREATININE 4.47* 4.19*  CALCIUM 9.0 8.9     Liver Function Tests  Recent Labs (last 2 labs)    Recent Labs  03/03/16 1610  AST 15  ALT 32  ALKPHOS 94  BILITOT 0.2*  PROT 6.9  ALBUMIN 3.5       Telemetry    SB, no adverse arrhythmia - Personally Reviewed  ECG    SB 55, LVH - Personally Reviewed  Radiology    Imaging Results (Last 48 hours)  No results found.    Cardiac Studies   Cath 03/04/16:  Mid RCA lesion, 65 %stenosed. Dist RCA lesion, 85 %stenosed. Relatively small caliber vessel. Smaller diameter than 5 French catheter  Prox LAD lesion, 45 %stenosed. Mid LAD lesion, 35 %stenosed.  3rd Mrg lesion, 70 %stenosed.  LV end diastolic pressure is  moderately elevated.   Patient does have severe 2 vessel disease involving the RCA and OM 3. Both vessels are relatively small in caliber and not good PCI targets. She was severely hypertensive with blood pressures 230/110 upon arrival the Cath Lab. After sedation hydralazine 20 mg the pressures went down into the 277O systolic.  Plan:  Return to the nursing unit for continued IV hydration and monitoring of his glucose status.  Consider discharge later today if stable versus in the morning.  When necessary hydralazine ordered for hypertension\  Would need additional blood pressure management for home therapy.  As for preoperative evaluation, I would not consider PCI prior to his AV fistula procedure.    Glenetta Hew, M.D., M.S.  Patient Profile       Ganon ANKUSH GINTZ is a 55 y.o. male with a history of HTN, HLD, CVA, DMT2, and ESRD with plans for HD who presented to clinic for pre op clearance prior to AV fistula placement.   Assessment & Plan    Pre-op cardiovascular exam  - CAD, 2 vessel in RCA and OM3. Medical mgt. No PCI.  -  Moderate risk to proceed with AV fistula   - Dr. Justin Mend aware.  - Awaiting BMET this AM. As long as not significantly elevated, OK to DC.   Hypertension  - longstanding  - increasing coreg to 6.25BID  Work note for his wife for yesterday and today.   Signed, Candee Furbish, MD

## 2016-03-05 NOTE — Progress Notes (Signed)
[facility] ROUNDING NOTE   Subjective:   Interval History: walking and no complaints   Objective:  Vital signs in last 24 hours:  Temp:  [98.3 F (36.8 C)-99.1 F (37.3 C)] 98.4 F (36.9 C) (10/17 0750) Pulse Rate:  [0-93] 65 (10/17 0750) Resp:  [10-18] 18 (10/17 0750) BP: (128-204)/(53-104) 162/63 (10/17 0750) SpO2:  [100 %] 100 % (10/17 0750)  Weight change:  Filed Weights   03/03/16 1529 03/03/16 1733  Weight: 77.3 kg (170 lb 6.7 oz) 77.3 kg (170 lb 6.7 oz)    Intake/Output: I/O last 3 completed shifts: In: 933.9 [P.O.:220; I.V.:713.9] Out: -    Intake/Output this shift:  Total I/O In: 300 [P.O.:300] Out: -   CVS- RRR RS- CTA ABD- BS present soft non-distended EXT- no edema   Basic Metabolic Panel:  Recent Labs Lab 03/01/16 0856 03/03/16 1610 03/04/16 0230  NA 137 139 142  K 5.6* 5.0 4.2  CL 107 112* 115*  CO2 22 20* 20*  GLUCOSE 277* 157* 40*  BUN 88* 86* 80*  CREATININE 4.38* 4.47* 4.19*  CALCIUM 8.9 9.0 8.9    Liver Function Tests:  Recent Labs Lab 03/03/16 1610  AST 15  ALT 32  ALKPHOS 94  BILITOT 0.2*  PROT 6.9  ALBUMIN 3.5   No results for input(s): LIPASE, AMYLASE in the last 168 hours. No results for input(s): AMMONIA in the last 168 hours.  CBC:  Recent Labs Lab 03/01/16 0856 03/03/16 1610  WBC 4.5 5.1  NEUTROABS 2,610 2.9  HGB 10.7* 10.4*  HCT 32.5* 30.5*  MCV 84.6 80.9  PLT 126* 125*    Cardiac Enzymes: No results for input(s): CKTOTAL, CKMB, CKMBINDEX, TROPONINI in the last 168 hours.  BNP: Invalid input(s): POCBNP  CBG:  Recent Labs Lab 03/04/16 1111 03/04/16 1740 03/04/16 2219 03/05/16 0042 03/05/16 0614  GLUCAP 110* 330* 2* 163* 153*    Microbiology: Results for orders placed or performed during the hospital encounter of 03/03/16  Surgical PCR screen     Status: None   Collection Time: 03/03/16  8:08 PM  Result Value Ref Range Status   MRSA, PCR NEGATIVE NEGATIVE Final   Staphylococcus aureus NEGATIVE NEGATIVE Final    Comment:        The Xpert SA Assay (FDA approved for NASAL specimens in patients over 39 years of age), is one component of a comprehensive surveillance program.  Test performance has been validated by [facility] for patients greater than or equal to 3 year old. It is not intended to diagnose infection nor to guide or monitor treatment.     Coagulation Studies: No results for input(s): LABPROT, INR in the last 72 hours.  Urinalysis: No results for input(s): COLORURINE, LABSPEC, PHURINE, GLUCOSEU, HGBUR, BILIRUBINUR, KETONESUR, PROTEINUR, UROBILINOGEN, NITRITE, LEUKOCYTESUR in the last 72 hours.  Invalid input(s): APPERANCEUR    Imaging: No results found.   Medications:   . dextrose 5 % and 0.45% NaCl    . dextrose 5 % and 0.45% NaCl Stopped (03/04/16 1500)   . aspirin EC  81 mg Oral Daily  . atorvastatin  40 mg Oral q1800  . carvedilol  6.25 mg Oral BID  . heparin  5,000 Units Subcutaneous Q8H  . Influenza vac split quadrivalent PF  0.5 mL Intramuscular Tomorrow-1000  . insulin aspart  0-15 Units Subcutaneous TID WC  . insulin glargine  40 Units Subcutaneous Daily  . NIFEdipine  90 mg Oral Daily  . pneumococcal 23 valent vaccine  0.5 mL Intramuscular Tomorrow-1000  . prednisoLONE acetate  1 drop Left Eye Daily  . sodium chloride flush  3 mL Intravenous Q12H   sodium chloride, acetaminophen, hydrALAZINE, nitroGLYCERIN, ondansetron (ZOFRAN) IV, sodium chloride flush  Assessment/ Plan:   CKD stage 4/5  High risk for surgery and admitted for cardiac cath and evaluation prior to placement of AVF  No change in creatinine  HTN controlled  Anemia stable  Bones no VDA  CAD  S/p cath  2 v disease  Patient doing well no increase in creatinine   [facility] to schedule outpatient placement of access   2 v CAD noted with small vessels and no targets    LOS: 0 Hulda Reddix W @TODAY @9 :25 AM

## 2016-03-06 MED FILL — NIFEDIPINE ER 90 MG TABLET: 90 | 30 days supply | Qty: 30 | Fill #1

## 2016-03-06 MED FILL — RENA-VITE TABLET: 30 days supply | Qty: 30 | Fill #3

## 2016-03-06 MED FILL — ATORVASTATIN 40 MG TABLET: 40 | 30 days supply | Qty: 30 | Fill #1

## 2016-03-06 MED FILL — GABAPENTIN 300 MG CAPSULE: 300 | 30 days supply | Qty: 60 | Fill #0

## 2016-03-09 ENCOUNTER — Encounter (HOSPITAL_COMMUNITY): Payer: Self-pay | Admitting: *Deleted

## 2016-03-09 NOTE — Progress Notes (Signed)
Patient denied chest pain, shortness of breath. Most recent cardiac studies/ cardiac visit in chart

## 2016-03-11 NOTE — Anesthesia Preprocedure Evaluation (Addendum)
Anesthesia Evaluation  Patient identified by MRN, date of birth, ID band Patient awake    Reviewed: Allergy & Precautions, NPO status , Patient's Chart, lab work & pertinent test results  History of Anesthesia Complications Negative for: history of anesthetic complications  Airway Mallampati: II  TM Distance: >3 FB Neck ROM: Full    Dental no notable dental hx. (+) Dental Advisory Given   Pulmonary former smoker,    Pulmonary exam normal        Cardiovascular hypertension, + CAD and + Peripheral Vascular Disease  Normal cardiovascular exam  Conclusion     Mid RCA lesion, 65 %stenosed. Dist RCA lesion, 85 %stenosed. Relatively small caliber vessel. Smaller diameter than 5 French catheter  Prox LAD lesion, 45 %stenosed. Mid LAD lesion, 35 %stenosed.  3rd Mrg lesion, 70 %stenosed.  LV end diastolic pressure is moderately elevated.   Study Conclusions  - Left ventricle: Inferobasal hypokinesis The cavity size was   normal. Wall thickness was increased in a pattern of mild LVH.   Systolic function was normal. The estimated ejection fraction was   in the range of 55% to 60%. Wall motion was normal; there were no   regional wall motion abnormalities. - Left atrium: The atrium was mildly dilated. - Atrial septum: No defect or patent foramen ovale was identified.     Neuro/Psych CVA negative psych ROS   GI/Hepatic negative GI ROS, Neg liver ROS,   Endo/Other  diabetes  Renal/GU ESRFRenal disease     Musculoskeletal   Abdominal   Peds  Hematology  (+) anemia ,   Anesthesia Other Findings   Reproductive/Obstetrics                            Anesthesia Physical Anesthesia Plan  ASA: III  Anesthesia Plan: MAC   Post-op Pain Management:    Induction:   Airway Management Planned: Natural Airway and Simple Face Mask  Additional Equipment:   Intra-op Plan:   Post-operative  Plan: Extubation in OR  Informed Consent: I have reviewed the patients History and Physical, chart, labs and discussed the procedure including the risks, benefits and alternatives for the proposed anesthesia with the patient or authorized representative who has indicated his/her understanding and acceptance.   Dental advisory given  Plan Discussed with: CRNA, Anesthesiologist and Surgeon  Anesthesia Plan Comments:        Anesthesia Quick Evaluation

## 2016-03-12 ENCOUNTER — Ambulatory Visit (HOSPITAL_COMMUNITY): Payer: Medicaid Other

## 2016-03-12 ENCOUNTER — Ambulatory Visit (HOSPITAL_COMMUNITY): Payer: Medicaid Other | Admitting: Anesthesiology

## 2016-03-12 ENCOUNTER — Encounter (HOSPITAL_COMMUNITY): Payer: Self-pay | Admitting: Anesthesiology

## 2016-03-12 ENCOUNTER — Ambulatory Visit (HOSPITAL_COMMUNITY)
Admission: RE | Admit: 2016-03-12 | Discharge: 2016-03-12 | Disposition: A | Discharge status: Home / Self Care | Payer: Medicaid Other | Source: Ambulatory Visit | Attending: Vascular Surgery | Admitting: Vascular Surgery

## 2016-03-12 ENCOUNTER — Other Ambulatory Visit: Payer: Self-pay

## 2016-03-12 ENCOUNTER — Encounter (HOSPITAL_COMMUNITY)
Admission: RE | Disposition: A | Discharge status: Home / Self Care | Payer: Self-pay | Source: Ambulatory Visit | Attending: Vascular Surgery

## 2016-03-12 DIAGNOSIS — Z452 Encounter for adjustment and management of vascular access device: Secondary | ICD-10-CM

## 2016-03-12 DIAGNOSIS — I129 Hypertensive chronic kidney disease with stage 1 through stage 4 chronic kidney disease, or unspecified chronic kidney disease: Secondary | ICD-10-CM | POA: Insufficient documentation

## 2016-03-12 DIAGNOSIS — Z87891 Personal history of nicotine dependence: Secondary | ICD-10-CM | POA: Diagnosis not present

## 2016-03-12 DIAGNOSIS — N189 Chronic kidney disease, unspecified: Secondary | ICD-10-CM | POA: Diagnosis not present

## 2016-03-12 DIAGNOSIS — N185 Chronic kidney disease, stage 5: Secondary | ICD-10-CM | POA: Diagnosis not present

## 2016-03-12 DIAGNOSIS — E1151 Type 2 diabetes mellitus with diabetic peripheral angiopathy without gangrene: Secondary | ICD-10-CM | POA: Diagnosis not present

## 2016-03-12 DIAGNOSIS — I2510 Atherosclerotic heart disease of native coronary artery without angina pectoris: Secondary | ICD-10-CM | POA: Diagnosis not present

## 2016-03-12 DIAGNOSIS — Z794 Long term (current) use of insulin: Secondary | ICD-10-CM | POA: Diagnosis not present

## 2016-03-12 DIAGNOSIS — E1122 Type 2 diabetes mellitus with diabetic chronic kidney disease: Secondary | ICD-10-CM | POA: Diagnosis not present

## 2016-03-12 DIAGNOSIS — D649 Anemia, unspecified: Secondary | ICD-10-CM | POA: Insufficient documentation

## 2016-03-12 DIAGNOSIS — Z8673 Personal history of transient ischemic attack (TIA), and cerebral infarction without residual deficits: Secondary | ICD-10-CM | POA: Insufficient documentation

## 2016-03-12 DIAGNOSIS — Z48812 Encounter for surgical aftercare following surgery on the circulatory system: Secondary | ICD-10-CM

## 2016-03-12 DIAGNOSIS — I878 Other specified disorders of veins: Secondary | ICD-10-CM | POA: Insufficient documentation

## 2016-03-12 DIAGNOSIS — Z79899 Other long term (current) drug therapy: Secondary | ICD-10-CM | POA: Diagnosis not present

## 2016-03-12 DIAGNOSIS — Z7982 Long term (current) use of aspirin: Secondary | ICD-10-CM | POA: Diagnosis not present

## 2016-03-12 DIAGNOSIS — N184 Chronic kidney disease, stage 4 (severe): Secondary | ICD-10-CM

## 2016-03-12 HISTORY — PX: AV FISTULA PLACEMENT: SHX1204

## 2016-03-12 HISTORY — DX: Cerebral infarction, unspecified: I63.9

## 2016-03-12 HISTORY — DX: Unspecified glaucoma: H40.9

## 2016-03-12 LAB — POCT I-STAT 4, (NA,K, GLUC, HGB,HCT)
Glucose, Bld: 140 mg/dL — ABNORMAL HIGH (ref 65–99)
HCT: 29 % — ABNORMAL LOW (ref 39.0–52.0)
Hemoglobin: 9.9 g/dL — ABNORMAL LOW (ref 13.0–17.0)
POTASSIUM: 5 mmol/L (ref 3.5–5.1)
Sodium: 143 mmol/L (ref 135–145)

## 2016-03-12 LAB — GLUCOSE, CAPILLARY
GLUCOSE-CAPILLARY: 142 mg/dL — AB (ref 65–99)
Glucose-Capillary: 162 mg/dL — ABNORMAL HIGH (ref 65–99)

## 2016-03-12 SURGERY — ARTERIOVENOUS (AV) FISTULA CREATION
Anesthesia: Monitor Anesthesia Care | Site: Arm Lower | Laterality: Left

## 2016-03-12 MED ORDER — CARVEDILOL 6.25 MG PO TABS
6.2500 mg | ORAL_TABLET | Freq: Once | ORAL | Status: AC
  Administered 2016-03-12: 6.25 mg via ORAL
  Filled 2016-03-12: qty 2
  Filled 2016-03-12: qty 1

## 2016-03-12 MED ORDER — 0.9 % SODIUM CHLORIDE (POUR BTL) OPTIME
TOPICAL | Status: DC | PRN
  Administered 2016-03-12: 1000 mL

## 2016-03-12 MED ORDER — SODIUM CHLORIDE 0.9 % IV SOLN: INTRAVENOUS | Status: DC

## 2016-03-12 MED ORDER — DEXTROSE 5 % IV SOLN
1.5000 g | INTRAVENOUS | Status: AC
  Administered 2016-03-12: 1.5 g via INTRAVENOUS
  Filled 2016-03-12: qty 1.5

## 2016-03-12 MED ORDER — FENTANYL CITRATE (PF) 100 MCG/2ML IJ SOLN
INTRAMUSCULAR | Status: AC
  Filled 2016-03-12: qty 2

## 2016-03-12 MED ORDER — SODIUM CHLORIDE 0.9 % IV SOLN
INTRAVENOUS | Status: DC
  Administered 2016-03-12: 07:00:00 via INTRAVENOUS

## 2016-03-12 MED ORDER — PROPOFOL 500 MG/50ML IV EMUL
INTRAVENOUS | Status: DC | PRN
  Administered 2016-03-12: 55 ug/kg/min via INTRAVENOUS

## 2016-03-12 MED ORDER — LIDOCAINE-EPINEPHRINE (PF) 1 %-1:200000 IJ SOLN
INTRAMUSCULAR | Status: AC
  Filled 2016-03-12: qty 30

## 2016-03-12 MED ORDER — SODIUM CHLORIDE 0.9 % IV SOLN
INTRAVENOUS | Status: DC | PRN
  Administered 2016-03-12: 500 mL

## 2016-03-12 MED ORDER — PROPOFOL 10 MG/ML IV BOLUS
INTRAVENOUS | Status: AC
  Filled 2016-03-12: qty 20

## 2016-03-12 MED ORDER — FENTANYL CITRATE (PF) 100 MCG/2ML IJ SOLN
INTRAMUSCULAR | Status: DC | PRN
  Administered 2016-03-12: 50 ug via INTRAVENOUS

## 2016-03-12 MED ORDER — CHLORHEXIDINE GLUCONATE CLOTH 2 % EX PADS: 6.0000 | MEDICATED_PAD | Freq: Once | CUTANEOUS | Status: DC

## 2016-03-12 MED ORDER — MIDAZOLAM HCL 2 MG/2ML IJ SOLN
INTRAMUSCULAR | Status: AC
  Filled 2016-03-12: qty 2

## 2016-03-12 MED ORDER — LIDOCAINE-EPINEPHRINE (PF) 1 %-1:200000 IJ SOLN
INTRAMUSCULAR | Status: DC | PRN
  Administered 2016-03-12 (×2): 30 mL

## 2016-03-12 MED ORDER — OXYCODONE-ACETAMINOPHEN 5-325 MG PO TABS: 1.0000 | ORAL_TABLET | Freq: Four times a day (QID) | ORAL | 0 refills | Status: DC | PRN

## 2016-03-12 MED ORDER — HYDROMORPHONE HCL 1 MG/ML IJ SOLN: 0.2500 mg | INTRAMUSCULAR | Status: DC | PRN

## 2016-03-12 MED ORDER — MIDAZOLAM HCL 5 MG/5ML IJ SOLN
INTRAMUSCULAR | Status: DC | PRN
  Administered 2016-03-12: 2 mg via INTRAVENOUS

## 2016-03-12 MED ORDER — PROMETHAZINE HCL 25 MG/ML IJ SOLN: 6.2500 mg | INTRAMUSCULAR | Status: DC | PRN

## 2016-03-12 SURGICAL SUPPLY — 25 items
ARMBAND PINK RESTRICT EXTREMIT (MISCELLANEOUS) ×3 IMPLANT
CANISTER SUCTION 2500CC (MISCELLANEOUS) ×3 IMPLANT
CLIP TI MEDIUM 6 (CLIP) ×3 IMPLANT
CLIP TI WIDE RED SMALL 6 (CLIP) ×3 IMPLANT
COVER PROBE W GEL 5X96 (DRAPES) ×3 IMPLANT
DERMABOND ADVANCED (GAUZE/BANDAGES/DRESSINGS) ×2
DERMABOND ADVANCED .7 DNX12 (GAUZE/BANDAGES/DRESSINGS) ×1 IMPLANT
ELECT REM PT RETURN 9FT ADLT (ELECTROSURGICAL) ×3
ELECTRODE REM PT RTRN 9FT ADLT (ELECTROSURGICAL) ×1 IMPLANT
GLOVE BIO SURGEON STRL SZ7.5 (GLOVE) ×3 IMPLANT
GOWN STRL REUS W/ TWL LRG LVL3 (GOWN DISPOSABLE) ×2 IMPLANT
GOWN STRL REUS W/ TWL XL LVL3 (GOWN DISPOSABLE) ×1 IMPLANT
GOWN STRL REUS W/TWL LRG LVL3 (GOWN DISPOSABLE) ×4
GOWN STRL REUS W/TWL XL LVL3 (GOWN DISPOSABLE) ×2
KIT BASIN OR (CUSTOM PROCEDURE TRAY) ×3 IMPLANT
KIT ROOM TURNOVER OR (KITS) ×3 IMPLANT
NS IRRIG 1000ML POUR BTL (IV SOLUTION) ×3 IMPLANT
PACK CV ACCESS (CUSTOM PROCEDURE TRAY) ×3 IMPLANT
PAD ARMBOARD 7.5X6 YLW CONV (MISCELLANEOUS) ×6 IMPLANT
SUT MNCRL AB 4-0 PS2 18 (SUTURE) ×3 IMPLANT
SUT PROLENE 6 0 BV (SUTURE) ×3 IMPLANT
SUT VIC AB 3-0 SH 27 (SUTURE) ×2
SUT VIC AB 3-0 SH 27X BRD (SUTURE) ×1 IMPLANT
UNDERPAD 30X30 (UNDERPADS AND DIAPERS) ×3 IMPLANT
WATER STERILE IRR 1000ML POUR (IV SOLUTION) ×3 IMPLANT

## 2016-03-12 NOTE — Anesthesia Procedure Notes (Signed)
Procedure Name: MAC Date/Time: 03/12/2016 7:54 AM Performed by: Neldon Newport Pre-anesthesia Checklist: Timeout performed, Patient being monitored, Suction available, Emergency Drugs available and Patient identified Patient Re-evaluated:Patient Re-evaluated prior to inductionOxygen Delivery Method: Simple face mask

## 2016-03-12 NOTE — H&P (Signed)
     History and Physical Update  The patient was interviewed and re-examined.  The patient's previous History and Physical has been reviewed and is unchanged from office and last visit. Two vessel CAD noted on cath not amenable to intervention. Does not require dialysis yet, will plan for left first stage bvt vs graft.  Rorik Vespa C. Donzetta Matters, MD [facility] Office: [phone] Pager: [phone]   03/12/2016, 7:12 AM

## 2016-03-12 NOTE — Op Note (Signed)
    OPERATIVE NOTE   PROCEDURE: left 1st stage brachiobasilic av fistula creation  PRE-OPERATIVE DIAGNOSIS: ckd  POST-OPERATIVE DIAGNOSIS: same  SURGEON: Annaliz Aven C. Donzetta Matters, MD  ASSISTANT(S): Gerri Lins, PA  ANESTHESIA: local and MAC  ESTIMATED BLOOD LOSS: 20 cc  FINDING(S): Adequate basilic vein although with some internal sclerosis, dilated easily to 22mm. Large braachial artery with 87mm external diameter. Strong thrill at completion with signal at radial artery  SPECIMEN(S):  none  INDICATIONS:   Russell Price is a 55 y.o. male who presents with ckd.  The patient is scheduled for left avf vs avg. The patient is aware the risks include but are not limited to: bleeding, infection, steal syndrome, nerve damage, ischemic monomelic neuropathy, failure to mature, and need for additional procedures.  The patient is aware of the risks of the procedure and elects to proceed forward.  DESCRIPTION: After full informed written consent was obtained from the patient, the patient was brought back to the operating room and placed supine upon the operating table.  Prior to induction, the patient received IV antibiotics.   After obtaining adequate anesthesia, the patient was then prepped and draped in the standard fashion for a left arm access procedure.  I turned my attention first to identifying the patient's basilic and brachial artery.  Using SonoSite guidance, the location of these vessels were marked out on the skin.   At this point, I injected local anesthetic to obtain a field block of the antecubitum  I made a longitudinal incision at the level of the antecubitum and dissected through the subcutaneous tissue and fascia to gain exposure of the basilic vein first followed by brachial artery.  The distal segment of the vein was ligated with a  2-0 silk, and the vein was transected.  The proximal segment was interrogated with serial dilators.  The vein accepted up to a 4 mm dilator without any  difficulty.  I then instilled the heparinized saline into the vein and clamped it. I made an arteriotomy with a #11 blade, and then I extended the arteriotomy with a Potts scissor.  I injected heparinized saline proximal and distal to this arteriotomy.  The vein was then sewn to the artery in an end-to-side configuration with a running stitch of 6-0 Prolene.  Prior to completing this anastomosis, I allowed the vein and artery to backbleed.  There was no evidence of clot from any vessels.  I completed the anastomosis in the usual fashion and then released all vessel loops and clamps.  There was a palpable  thrill in the venous outflow, and there was a signal at the radial artery.  At this point, I irrigated out the surgical wound.  There was no further active bleeding.  The subcutaneous tissue was reapproximated with a running stitch of 3-0 Vicryl.  The skin was then reapproximated with a running subcuticular stitch of 4-0 Vicryl.  The skin was then cleaned, dried, and reinforced with Dermabond.  The patient tolerated this procedure well.   COMPLICATIONS: none  CONDITION: stable   Leiyah Maultsby C. Donzetta Matters, MD [facility] Office: [phone] Pager: [phone]  03/12/2016, 8:28 AM

## 2016-03-12 NOTE — Transfer of Care (Signed)
Immediate Anesthesia Transfer of Care Note  Patient: Russell Price  Procedure(s) Performed: Procedure(s): Left Arm ARTERIOVENOUS (AV) FISTULA CREATION (Left)  Patient Location: PACU  Anesthesia Type:MAC  Level of Consciousness: awake, alert  and oriented  Airway & Oxygen Therapy: Patient Spontanous Breathing  Post-op Assessment: Report given to RN, Post -op Vital signs reviewed and stable and Patient moving all extremities X 4  Post vital signs: Reviewed and stable  Last Vitals:  Vitals:   03/12/16 0616 03/12/16 0658  BP: (!) 175/68 (!) 172/76  Pulse: (!) 52 (!) 58  Resp: 18   Temp: 36.6 C     Last Pain:  Vitals:   03/12/16 0616  TempSrc: Oral      Patients Stated Pain Goal: 0 (37/79/39 [id number])  Complications: No apparent anesthesia complications

## 2016-03-12 NOTE — Anesthesia Procedure Notes (Signed)
Central Venous Catheter Insertion Performed by: anesthesiologist Patient location: Pre-op. Preanesthetic checklist: patient identified, IV checked, site marked, risks and benefits discussed, surgical consent, monitors and equipment checked, pre-op evaluation, timeout performed and anesthesia consent Position: Trendelenburg Lidocaine 1% used for infiltration Landmarks identified and Seldinger technique used Catheter size: 8 Fr Central line was placed.Double lumen Procedure performed using ultrasound guided technique. Attempts: 1 Following insertion, dressing applied and line sutured. Post procedure assessment: blood return through all ports. Patient tolerated the procedure well with no immediate complications.

## 2016-03-12 NOTE — Anesthesia Postprocedure Evaluation (Signed)
Anesthesia Post Note  Patient: Russell Price  Procedure(s) Performed: Procedure(s) (LRB): Left Arm ARTERIOVENOUS (AV) FISTULA CREATION (Left)  Patient location during evaluation: PACU Anesthesia Type: MAC Level of consciousness: awake and alert Pain management: pain level controlled Vital Signs Assessment: post-procedure vital signs reviewed and stable Respiratory status: spontaneous breathing and respiratory function stable Cardiovascular status: stable Anesthetic complications: no    Last Vitals:  Vitals:   03/12/16 0953 03/12/16 1003  BP:  (!) 140/55  Pulse: (!) 47 (!) 52  Resp: 13 16  Temp: 36.1 C     Last Pain:  Vitals:   03/12/16 1003  TempSrc:   PainSc: 0-No pain                 Russell Price

## 2016-03-13 ENCOUNTER — Encounter (HOSPITAL_COMMUNITY): Payer: Self-pay | Admitting: Vascular Surgery

## 2016-03-13 ENCOUNTER — Telehealth: Payer: Self-pay | Admitting: Vascular Surgery

## 2016-03-13 NOTE — Telephone Encounter (Signed)
-----   Message from Denman George, RN sent at 03/12/2016 11:28 AM EDT ----- Regarding: needs 5-6 week f/u with left arm access duplex and appt. with Dr. Donzetta Matters   ----- Message ----- From: Ulyses Amor, PA-C Sent: 03/12/2016   8:39 AM To: Vvs Charge Pool  F/U in 5-6 weeks with duplex s/p left AV fistula Dr. Donzetta Matters

## 2016-03-13 NOTE — Telephone Encounter (Signed)
Pts vm is full, mailed letter for appt 12/8

## 2016-03-14 MED FILL — ACCU-CHEK AVIVA PLUS TEST S: 25 days supply | Qty: 100 | Fill #3

## 2016-04-03 MED FILL — GABAPENTIN 300 MG CAPSULE: 300 | 30 days supply | Qty: 60 | Fill #1

## 2016-04-03 MED FILL — ATORVASTATIN 40 MG TABLET: 40 | 30 days supply | Qty: 30 | Fill #2

## 2016-04-03 MED FILL — NIFEDIPINE ER 90 MG TABLET: 90 | 30 days supply | Qty: 30 | Fill #2

## 2016-04-15 MED FILL — ?CARVEDILOL 6.25 MG TABLET: 6.25 | 30 days supply | Qty: 60 | Fill #1

## 2016-04-15 MED FILL — RENA-VITE TABLET: 30 days supply | Qty: 30 | Fill #4

## 2016-04-15 MED FILL — !NOVOLOG 100UNITS/ML VIAL: 100/ML | 27 days supply | Qty: 10 | Fill #1

## 2016-04-17 MED FILL — ACCU-CHEK AVIVA PLUS TEST S: 25 days supply | Qty: 100 | Fill #4

## 2016-04-22 ENCOUNTER — Encounter: Payer: Self-pay | Admitting: Vascular Surgery

## 2016-04-26 ENCOUNTER — Ambulatory Visit (INDEPENDENT_AMBULATORY_CARE_PROVIDER_SITE_OTHER): Payer: Self-pay | Admitting: Vascular Surgery

## 2016-04-26 ENCOUNTER — Ambulatory Visit (HOSPITAL_COMMUNITY)
Admission: RE | Admit: 2016-04-26 | Discharge: 2016-04-26 | Disposition: A | Discharge status: Home / Self Care | Payer: Medicaid Other | Source: Ambulatory Visit | Attending: Vascular Surgery | Admitting: Vascular Surgery

## 2016-04-26 ENCOUNTER — Encounter: Payer: Self-pay | Admitting: Vascular Surgery

## 2016-04-26 ENCOUNTER — Other Ambulatory Visit: Payer: Self-pay

## 2016-04-26 VITALS — BP 172/80 | HR 73 | Temp 97.0°F | Resp 18 | Ht 71.0 in | Wt 180.9 lb

## 2016-04-26 DIAGNOSIS — Z48812 Encounter for surgical aftercare following surgery on the circulatory system: Secondary | ICD-10-CM | POA: Insufficient documentation

## 2016-04-26 DIAGNOSIS — N184 Chronic kidney disease, stage 4 (severe): Secondary | ICD-10-CM | POA: Insufficient documentation

## 2016-04-26 NOTE — Progress Notes (Signed)
Subjective:     Patient ID: RENDELL THIVIERGE, male   DOB: [birth year], 55 y.o.   MRN: [id number]  HPI 55 year old male returns for follow-up place after first stage BVT placed in October. His hand is doing well and he has no complaints of pain at this time. He is not currently on dialysis and had duplex of his DVT prior to today's visit.    Review of Systems No complaints    Objective:   Physical Exam Awake alert and oriented. 1+ left radial pulse. Strong thrill in runoff vein   Increased velocity at the left anastomosis distal upper arm with audible bruit size ranging from 0.37 to  0.71 cm. Flow volume is 850 mL/m    Assessment/Plan     55 year old male status post left first stage basilic vein transposition. He does have some increased velocities at the anastomosis but with adequate flow volumes. We'll plan second stage at soonest possible date. Discussed risk and benefits again with him including the risk of steal injury to nerves injured artery and veins he understands and is willing to proceed.  Alora Gorey C. Donzetta Matters, MD [facility] Office: [phone] Pager: [phone]

## 2016-04-29 MED FILL — CHLORTHALIDONE 25 MG TABLET: 25 | 30 days supply | Qty: 30 | Fill #1

## 2016-04-29 MED FILL — GABAPENTIN 300 MG CAPSULE: 300 | 30 days supply | Qty: 60 | Fill #2

## 2016-04-29 MED FILL — ATORVASTATIN 40 MG TABLET: 40 | 30 days supply | Qty: 30 | Fill #3

## 2016-04-29 MED FILL — NIFEDIPINE ER 90 MG TABLET: 90 | 30 days supply | Qty: 30 | Fill #3

## 2016-04-30 MED FILL — LANTUS SOLOSTAR 100 UNITS/M: 100 | 25 days supply | Qty: 9 | Fill #5

## 2016-05-01 ENCOUNTER — Encounter: Payer: Self-pay | Admitting: Family Medicine

## 2016-05-01 ENCOUNTER — Ambulatory Visit: Payer: Medicaid Other | Attending: Family Medicine | Admitting: Family Medicine

## 2016-05-01 VITALS — BP 169/67 | HR 67 | Temp 97.0°F | Ht 70.5 in | Wt 183.0 lb

## 2016-05-01 DIAGNOSIS — R0609 Other forms of dyspnea: Secondary | ICD-10-CM | POA: Insufficient documentation

## 2016-05-01 DIAGNOSIS — H5461 Unqualified visual loss, right eye, normal vision left eye: Secondary | ICD-10-CM | POA: Diagnosis not present

## 2016-05-01 DIAGNOSIS — I10 Essential (primary) hypertension: Secondary | ICD-10-CM | POA: Diagnosis not present

## 2016-05-01 DIAGNOSIS — E0841 Diabetes mellitus due to underlying condition with diabetic mononeuropathy: Secondary | ICD-10-CM | POA: Diagnosis not present

## 2016-05-01 DIAGNOSIS — E1141 Type 2 diabetes mellitus with diabetic mononeuropathy: Secondary | ICD-10-CM | POA: Insufficient documentation

## 2016-05-01 DIAGNOSIS — Z79899 Other long term (current) drug therapy: Secondary | ICD-10-CM | POA: Diagnosis not present

## 2016-05-01 DIAGNOSIS — E119 Type 2 diabetes mellitus without complications: Secondary | ICD-10-CM

## 2016-05-01 DIAGNOSIS — I2510 Atherosclerotic heart disease of native coronary artery without angina pectoris: Secondary | ICD-10-CM | POA: Insufficient documentation

## 2016-05-01 DIAGNOSIS — R600 Localized edema: Secondary | ICD-10-CM | POA: Diagnosis not present

## 2016-05-01 DIAGNOSIS — Z7982 Long term (current) use of aspirin: Secondary | ICD-10-CM | POA: Diagnosis not present

## 2016-05-01 DIAGNOSIS — E7800 Pure hypercholesterolemia, unspecified: Secondary | ICD-10-CM | POA: Insufficient documentation

## 2016-05-01 DIAGNOSIS — I120 Hypertensive chronic kidney disease with stage 5 chronic kidney disease or end stage renal disease: Secondary | ICD-10-CM | POA: Insufficient documentation

## 2016-05-01 DIAGNOSIS — E1122 Type 2 diabetes mellitus with diabetic chronic kidney disease: Secondary | ICD-10-CM | POA: Insufficient documentation

## 2016-05-01 DIAGNOSIS — N186 End stage renal disease: Secondary | ICD-10-CM | POA: Diagnosis not present

## 2016-05-01 DIAGNOSIS — N184 Chronic kidney disease, stage 4 (severe): Secondary | ICD-10-CM

## 2016-05-01 DIAGNOSIS — Z794 Long term (current) use of insulin: Secondary | ICD-10-CM | POA: Diagnosis not present

## 2016-05-01 LAB — GLUCOSE, POCT (MANUAL RESULT ENTRY): POC GLUCOSE: 109 mg/dL — AB (ref 70–99)

## 2016-05-01 LAB — POCT GLYCOSYLATED HEMOGLOBIN (HGB A1C): Hemoglobin A1C: 7.2

## 2016-05-01 MED ORDER — INSULIN GLARGINE 100 UNIT/ML ~~LOC~~ SOLN: 40.0000 [IU] | Freq: Every day | SUBCUTANEOUS | 3 refills | Status: DC

## 2016-05-01 MED ORDER — ATORVASTATIN CALCIUM 40 MG PO TABS: 40.0000 mg | ORAL_TABLET | Freq: Every day | ORAL | 5 refills | Status: DC

## 2016-05-01 MED ORDER — NIFEDIPINE ER 60 MG PO TB24: 120.0000 mg | ORAL_TABLET | Freq: Every day | ORAL | 5 refills | Status: DC

## 2016-05-01 MED ORDER — FUROSEMIDE 20 MG PO TABS: 20.0000 mg | ORAL_TABLET | Freq: Every day | ORAL | 0 refills | Status: DC

## 2016-05-01 MED ORDER — INSULIN ASPART 100 UNIT/ML ~~LOC~~ SOLN: 0.0000 [IU] | Freq: Three times a day (TID) | SUBCUTANEOUS | 5 refills | Status: DC

## 2016-05-01 MED ORDER — CARVEDILOL 6.25 MG PO TABS: 6.2500 mg | ORAL_TABLET | Freq: Two times a day (BID) | ORAL | 6 refills | Status: DC

## 2016-05-01 NOTE — Progress Notes (Signed)
Subjective:  Patient ID: Russell Price, male    DOB: 09/28/1960  Age: 55 y.o. MRN: [id number]  CC: Diabetes; Hypertension; blind in right eye; and Shortness of Breath (when laying flat)   HPI Russell Price is a 55 year old male with a history of hypertension, uncontrolled type 2 diabetes mellitus (A1c 7.2), diabetic neuropathy, blind in the right eye, stage IV chronic kidney disease who comes in today for follow-up of his diabetes mellitus. Denies hypoglycemia.  He is status post left first stage brachiobasilic AV fistula creation in 02/2016 and is scheduled for second stage and 05/14/16. Appointment with his nephrologist at [facility] comes up in 05/2016. He complains of facial puffiness which is worse in the mornings for the last 2 weeks and also endorses some shortness of breath which is worse when lying down flat. He denies pedal edema, chest pains. He has gained 13 pounds in the last 6 weeks.  2-D echo from 08/2015 revealed EF 55-60%, normal wall motion, normal systolic function. LHC from 02/2016 revealed two-vessel CAD; PCI on hold until after creation of AV fistula  He has also been seen by ophthalmology and has had a laser procedure to his eye.  Past Medical History:  Diagnosis Date  . CAD (coronary artery disease)    a. underwent cath in 02/2016 after an abnormal nuc which showed 2V CAD in RCA and OM3 with no good targets for PCI. Medical managment recommended.   . Chronic kidney disease   . Diabetes mellitus without complication ([place])    followed by Dr Chalmers Cater  . ESRD (end stage renal disease) ([place])   . Glaucoma   . Hypertension    followed by [facility]  . Stroke [facility])     Past Surgical History:  Procedure Laterality Date  . AV FISTULA PLACEMENT Left 03/12/2016   Procedure: Left Arm ARTERIOVENOUS (AV) FISTULA CREATION;  Surgeon: Waynetta Sandy, MD;  Location: [facility];  Service: Vascular;  Laterality: Left;  . BRONCHOSCOPY  02/14/2015   for pulm hemorrhage  . CARDIAC CATHETERIZATION N/A 03/04/2016   Procedure: Left Heart Cath and Coronary Angiography;  Surgeon: Leonie Man, MD;  Location: [facility];  Service: Cardiovascular;  Laterality: N/A;  . EYE SURGERY Left     Allergies  Allergen Reactions  . No Known Allergies      Outpatient Medications Prior to Visit  Medication Sig Dispense Refill  . b complex-vitamin c-folic acid (NEPHRO-VITE) 0.8 MG TABS tablet Take 1 tablet by mouth at bedtime.    . Blood Glucose Monitoring Suppl (ACCU-CHEK AVIVA) device Use as instructed 3 times daily before meals and at bedtime. 1 each 0  . gabapentin (NEURONTIN) 300 MG capsule   5  . glucose blood (ACCU-CHEK AVIVA PLUS) test strip Use 3 times daily before meals and at bedtime 100 each 12  . Lancet Devices (ACCU-CHEK SOFTCLIX) lancets Use as instructed 3 times daily before meals and at bedtime 1 each 12  . PREDNISOLONE ACETATE OP Apply to eye.    Marland Kitchen atorvastatin (LIPITOR) 40 MG tablet Take 1 tablet (40 mg total) by mouth daily at 6 PM. 30 tablet 5  . carvedilol (COREG) 6.25 MG tablet Take 1 tablet (6.25 mg total) by mouth 2 (two) times daily with a meal. 60 tablet 6  . insulin aspart (NOVOLOG) 100 UNIT/ML injection Inject 0-12 Units into the skin 3 (three) times daily before meals. 3 vial 5  . insulin glargine (LANTUS) 100 UNIT/ML injection Inject  40 Units into the skin daily.    Marland Kitchen NIFEdipine (ADALAT CC) 90 MG 24 hr tablet Take 1 tablet (90 mg total) by mouth daily. 30 tablet 5  . aspirin 81 MG chewable tablet Chew 1 tablet (81 mg total) by mouth daily. (Patient not taking: Reported on 05/01/2016)    . chlorthalidone (HYGROTON) 25 MG tablet Take 1 tablet (25 mg total) by mouth daily. (Patient not taking: Reported on 05/01/2016) 30 tablet 5  . oxyCODONE-acetaminophen (PERCOCET/ROXICET) 5-325 MG tablet Take 1 tablet by mouth every 6 (six) hours as needed. (Patient not taking: Reported on 04/26/2016) 6 tablet 0   No  facility-administered medications prior to visit.     ROS Review of Systems Constitutional: Negative for activity change and appetite change.  HENT: Negative for sinus pressure and sore throat.   Eyes: Positive for visual disturbance (blind in right eye).  Respiratory: Negative for cough, chest tightness and shortness of breath.   Cardiovascular: Negative for chest pain and leg swelling.  Gastrointestinal: Negative for abdominal distention, abdominal pain, constipation and diarrhea.  Endocrine: Negative.   Genitourinary: Negative for dysuria.  Musculoskeletal: Negative for joint swelling and myalgias.  Skin: Negative for rash.  Allergic/Immunologic: Negative.   Neurological: Negative for weakness, light-headedness and numbness.  Psychiatric/Behavioral: Negative for dysphoric mood and suicidal ideas.   Objective:  BP (!) 169/67 (BP Location: Right Arm, Patient Position: Sitting, Cuff Size: Large)   Pulse 67   Temp 97 F (36.1 C) (Oral)   Ht 5' 10.5" (1.791 m)   Wt 183 lb (83 kg)   SpO2 100%   BMI 25.89 kg/m   BP/Weight 05/01/2016 04/26/2016 40/97/3532  Systolic BP 992 426 834  Diastolic BP 67 80 55  Wt. (Lbs) 183 180.9 170  BMI 25.89 25.23 23.71    Wt Readings from Last 3 Encounters:  05/01/16 183 lb (83 kg)  04/26/16 180 lb 14.4 oz (82.1 kg)  03/12/16 170 lb (77.1 kg)     Physical Exam Constitutional: He is oriented to person, place, and time. He appears well-developed and well-nourished.  Eyes:  Blind in right eye  HEENT: Facial edema Neck: Normal range of motion. Neck supple. No tracheal deviation present.  Cardiovascular: Normal rate rate, regular rhythm and normal heart sounds.   No murmur heard. Pulmonary/Chest: Effort normal and breath sounds normal. No respiratory distress. He has no wheezes. He exhibits no tenderness.  Abdominal: Soft. Bowel sounds are normal. He exhibits no mass. There is no tenderness.  Musculoskeletal: Normal range of motion.  He  exhibits no tenderness. ; distal left upper arm with surgical scar, palpable thrill. Neurological: He is alert and oriented to person, place, and time.  Skin: Skin is warm and dry.  Psychiatric: He has a normal mood and affect.  Assessment & Plan:   1. Diabetic mononeuropathy associated with diabetes mellitus due to underlying condition (HCC) Continue gabapentin - Glucose (CBG) - HgB A1c  2. Essential hypertension Uncontrolled Nifedipine dose increased from 90 mg to 120 mg Low-sodium diet We'll reassess blood pressure at next visit - NIFEdipine (PROCARDIA-XL/ADALAT CC) 60 MG 24 hr tablet; Take 2 tablets (120 mg total) by mouth daily.  Dispense: 60 tablet; Refill: 5 - carvedilol (COREG) 6.25 MG tablet; Take 1 tablet (6.25 mg total) by mouth 2 (two) times daily with a meal.  Dispense: 60 tablet; Refill: 6  3. Type 2 diabetes mellitus with stage 4 chronic kidney disease, with long-term current use of insulin (HCC) Improved with A1c of 7.2  Continue current regimen Scheduled for stage 2 AV fistula placement later this month - atorvastatin (LIPITOR) 40 MG tablet; Take 1 tablet (40 mg total) by mouth daily at 6 PM.  Dispense: 30 tablet; Refill: 5 - insulin glargine (LANTUS) 100 UNIT/ML injection; Inject 0.4 mLs (40 Units total) into the skin daily.  Dispense: 10 mL; Refill: 3  4. Pure hypercholesterolemia Stable - atorvastatin (LIPITOR) 40 MG tablet; Take 1 tablet (40 mg total) by mouth daily at 6 PM.  Dispense: 30 tablet; Refill: 5  5. Diabetes mellitus type 2 in nonobese (HCC) - insulin aspart (NOVOLOG) 100 UNIT/ML injection; Inject 0-12 Units into the skin 3 (three) times daily before meals.  Dispense: 3 vial; Refill: 5  6. Other form of dyspnea/facial edema BNP likely to be elevated in the setting of CK D; patient edema could be secondary to nephrotic syndrome vs cardiac etiology 2-D echo from 08/2015 reveal EF 55-60% ; LHC -two-vessel CAD, PCI on hold pending AV fistula If symptoms  persist at his next visit I will order a repeat 2-D echo and refer back to cardiology. Trial of low-dose Lasix-short duration due to stage IV CK D - furosemide (LASIX) 20 MG tablet; Take 1 tablet (20 mg total) by mouth daily.  Dispense: 15 tablet; Refill: 0   Meds ordered this encounter  Medications  . NIFEdipine (PROCARDIA-XL/ADALAT CC) 60 MG 24 hr tablet    Sig: Take 2 tablets (120 mg total) by mouth daily.    Dispense:  60 tablet    Refill:  5    Discontinue previous dose  . atorvastatin (LIPITOR) 40 MG tablet    Sig: Take 1 tablet (40 mg total) by mouth daily at 6 PM.    Dispense:  30 tablet    Refill:  5  . carvedilol (COREG) 6.25 MG tablet    Sig: Take 1 tablet (6.25 mg total) by mouth 2 (two) times daily with a meal.    Dispense:  60 tablet    Refill:  6  . insulin aspart (NOVOLOG) 100 UNIT/ML injection    Sig: Inject 0-12 Units into the skin 3 (three) times daily before meals.    Dispense:  3 vial    Refill:  5    As per sliding scale  . insulin glargine (LANTUS) 100 UNIT/ML injection    Sig: Inject 0.4 mLs (40 Units total) into the skin daily.    Dispense:  10 mL    Refill:  3  . furosemide (LASIX) 20 MG tablet    Sig: Take 1 tablet (20 mg total) by mouth daily.    Dispense:  15 tablet    Refill:  0    Follow-up: Return in about 2 weeks (around 05/15/2016) for Follow-up: Dyspnea and hypertension.   Arnoldo Morale MD

## 2016-05-01 NOTE — Patient Instructions (Signed)
Shortness of Breath, Adult °Shortness of breath is when a person has trouble breathing enough air, or when a person feels like she or he is having trouble breathing in enough air. Shortness of breath could be a sign of medical problem. °Follow these instructions at home: °Pay attention to any changes in your symptoms. Take these actions to help with your condition: °· Do not smoke. Smoking is a common cause of shortness of breath. If you smoke and you need help quitting, ask your health care provider. °· Avoid things that can irritate your airways, such as: °¨ Mold. °¨ Dust. °¨ Air pollution. °¨ Chemical fumes. °¨ Things that can cause allergy symptoms (allergens), if you have allergies. °· Keep your living space clean and free of mold and dust. °· Rest as needed. Slowly return to your usual activities. °· Take over-the-counter and prescription medicines, including oxygen and inhaled medicines, only as told by your health care provider. °· Keep all follow-up visits as told by your health care provider. This is important. °Contact a health care provider if: °· Your condition does not improve as soon as expected. °· You have a hard time doing your normal activities, even after you rest. °· You have new symptoms. °Get help right away if: °· Your shortness of breath gets worse. °· You have shortness of breath when you are resting. °· You feel light-headed or you faint. °· You have a cough that is not controlled with medicines. °· You cough up blood. °· You have pain with breathing. °· You have pain in your chest, arms, shoulders, or abdomen. °· You have a fever. °· You cannot walk up stairs or exercise the way that you normally do. °This information is not intended to replace advice given to you by your health care provider. Make sure you discuss any questions you have with your health care provider. °Document Released: 01/29/2001 Document Revised: 11/25/2015 Document Reviewed: 10/12/2015 °Elsevier Interactive Patient  Education © 2017 [employer] ° °

## 2016-05-02 MED FILL — FUROSEMIDE 20 MG TABLET: 20 | 15 days supply | Qty: 15 | Fill #0

## 2016-05-10 ENCOUNTER — Encounter (HOSPITAL_COMMUNITY): Payer: Self-pay | Admitting: *Deleted

## 2016-05-10 NOTE — Progress Notes (Signed)
Spoke with pt for pre-op call. Pt has hx of CAD, cath done 03/04/16. Dr. Grandville Silos is his cardiologist. Pt denies recent chest pain, states he does have occasional shortness of breath. Pt is diabetic. Last A1C was 7.2 on 05/01/16. Pt uses only 10 units of Lantus (instead of 40 units as prescribed), he states his fasting blood sugar is usually between 60-70. Instructed pt to take 1/2 of his regular dose of Lantus insulin the night before surgery (will take 5 units), instructed pt not to take his Novalog insulin the morning of surgery. Instructed pt to check blood sugar the morning of surgery.  If blood sugar is 70 or below, treat with 1/2 cup of clear juice (apple or cranberry) and recheck blood sugar 15 minutes after drinking juice. Pt voiced understanding.

## 2016-05-14 ENCOUNTER — Telehealth: Payer: Self-pay | Admitting: Vascular Surgery

## 2016-05-14 ENCOUNTER — Ambulatory Visit (HOSPITAL_COMMUNITY): Payer: Medicaid Other | Admitting: Certified Registered Nurse Anesthetist

## 2016-05-14 ENCOUNTER — Encounter (HOSPITAL_COMMUNITY)
Admission: RE | Disposition: A | Discharge status: Home / Self Care | Payer: Self-pay | Source: Ambulatory Visit | Attending: Vascular Surgery

## 2016-05-14 ENCOUNTER — Encounter (HOSPITAL_COMMUNITY): Payer: Self-pay | Admitting: Certified Registered Nurse Anesthetist

## 2016-05-14 ENCOUNTER — Ambulatory Visit (HOSPITAL_COMMUNITY)
Admission: RE | Admit: 2016-05-14 | Discharge: 2016-05-14 | Disposition: A | Discharge status: Home / Self Care | Payer: Medicaid Other | Source: Ambulatory Visit | Attending: Vascular Surgery | Admitting: Vascular Surgery

## 2016-05-14 DIAGNOSIS — I120 Hypertensive chronic kidney disease with stage 5 chronic kidney disease or end stage renal disease: Secondary | ICD-10-CM | POA: Diagnosis present

## 2016-05-14 DIAGNOSIS — Z79899 Other long term (current) drug therapy: Secondary | ICD-10-CM | POA: Diagnosis not present

## 2016-05-14 DIAGNOSIS — Z992 Dependence on renal dialysis: Secondary | ICD-10-CM | POA: Diagnosis not present

## 2016-05-14 DIAGNOSIS — Z7982 Long term (current) use of aspirin: Secondary | ICD-10-CM | POA: Insufficient documentation

## 2016-05-14 DIAGNOSIS — Z8673 Personal history of transient ischemic attack (TIA), and cerebral infarction without residual deficits: Secondary | ICD-10-CM | POA: Diagnosis not present

## 2016-05-14 DIAGNOSIS — Z794 Long term (current) use of insulin: Secondary | ICD-10-CM | POA: Insufficient documentation

## 2016-05-14 DIAGNOSIS — N186 End stage renal disease: Secondary | ICD-10-CM | POA: Insufficient documentation

## 2016-05-14 DIAGNOSIS — E1122 Type 2 diabetes mellitus with diabetic chronic kidney disease: Secondary | ICD-10-CM | POA: Insufficient documentation

## 2016-05-14 DIAGNOSIS — Z87891 Personal history of nicotine dependence: Secondary | ICD-10-CM | POA: Diagnosis not present

## 2016-05-14 DIAGNOSIS — I2510 Atherosclerotic heart disease of native coronary artery without angina pectoris: Secondary | ICD-10-CM | POA: Diagnosis not present

## 2016-05-14 DIAGNOSIS — N184 Chronic kidney disease, stage 4 (severe): Secondary | ICD-10-CM

## 2016-05-14 DIAGNOSIS — E119 Type 2 diabetes mellitus without complications: Secondary | ICD-10-CM

## 2016-05-14 DIAGNOSIS — N185 Chronic kidney disease, stage 5: Secondary | ICD-10-CM | POA: Diagnosis not present

## 2016-05-14 HISTORY — PX: BASCILIC VEIN TRANSPOSITION: SHX5742

## 2016-05-14 LAB — GLUCOSE, CAPILLARY: Glucose-Capillary: 147 mg/dL — ABNORMAL HIGH (ref 65–99)

## 2016-05-14 LAB — POCT I-STAT 4, (NA,K, GLUC, HGB,HCT)
Glucose, Bld: 153 mg/dL — ABNORMAL HIGH (ref 65–99)
HEMATOCRIT: 30 % — AB (ref 39.0–52.0)
HEMOGLOBIN: 10.2 g/dL — AB (ref 13.0–17.0)
Potassium: 4.6 mmol/L (ref 3.5–5.1)
Sodium: 139 mmol/L (ref 135–145)

## 2016-05-14 SURGERY — TRANSPOSITION, VEIN, BASILIC
Anesthesia: General | Laterality: Left

## 2016-05-14 MED ORDER — CARVEDILOL 3.125 MG PO TABS
ORAL_TABLET | ORAL | Status: AC
  Administered 2016-05-14: 6.25 mg
  Filled 2016-05-14: qty 2

## 2016-05-14 MED ORDER — PHENYLEPHRINE HCL 10 MG/ML IJ SOLN
INTRAMUSCULAR | Status: DC | PRN
  Administered 2016-05-14: 80 ug via INTRAVENOUS
  Administered 2016-05-14: 40 ug via INTRAVENOUS
  Administered 2016-05-14: 120 ug via INTRAVENOUS
  Administered 2016-05-14 (×2): 80 ug via INTRAVENOUS

## 2016-05-14 MED ORDER — FENTANYL CITRATE (PF) 100 MCG/2ML IJ SOLN
INTRAMUSCULAR | Status: AC
  Filled 2016-05-14: qty 2

## 2016-05-14 MED ORDER — ONDANSETRON HCL 4 MG/2ML IJ SOLN
INTRAMUSCULAR | Status: DC | PRN
  Administered 2016-05-14: 4 mg via INTRAVENOUS

## 2016-05-14 MED ORDER — INSULIN GLARGINE 100 UNIT/ML ~~LOC~~ SOLN: 10.0000 [IU] | Freq: Every day | SUBCUTANEOUS | Status: DC

## 2016-05-14 MED ORDER — EPHEDRINE 5 MG/ML INJ
INTRAVENOUS | Status: AC
  Filled 2016-05-14: qty 10

## 2016-05-14 MED ORDER — FENTANYL CITRATE (PF) 100 MCG/2ML IJ SOLN
INTRAMUSCULAR | Status: DC | PRN
  Administered 2016-05-14: 100 ug via INTRAVENOUS

## 2016-05-14 MED ORDER — CHLORHEXIDINE GLUCONATE CLOTH 2 % EX PADS: 6.0000 | MEDICATED_PAD | Freq: Once | CUTANEOUS | Status: DC

## 2016-05-14 MED ORDER — ONDANSETRON HCL 4 MG/2ML IJ SOLN: 4.0000 mg | Freq: Once | INTRAMUSCULAR | Status: DC | PRN

## 2016-05-14 MED ORDER — PROPOFOL 10 MG/ML IV BOLUS
INTRAVENOUS | Status: DC | PRN
  Administered 2016-05-14: 50 mg via INTRAVENOUS
  Administered 2016-05-14: 150 mg via INTRAVENOUS

## 2016-05-14 MED ORDER — MIDAZOLAM HCL 5 MG/5ML IJ SOLN
INTRAMUSCULAR | Status: DC | PRN
  Administered 2016-05-14: 2 mg via INTRAVENOUS

## 2016-05-14 MED ORDER — SODIUM CHLORIDE 0.9 % IV SOLN: INTRAVENOUS | Status: DC

## 2016-05-14 MED ORDER — 0.9 % SODIUM CHLORIDE (POUR BTL) OPTIME
TOPICAL | Status: DC | PRN
  Administered 2016-05-14: 1000 mL

## 2016-05-14 MED ORDER — HYDROMORPHONE HCL 1 MG/ML IJ SOLN: 0.2500 mg | INTRAMUSCULAR | Status: DC | PRN

## 2016-05-14 MED ORDER — MEPERIDINE HCL 25 MG/ML IJ SOLN: 6.2500 mg | INTRAMUSCULAR | Status: DC | PRN

## 2016-05-14 MED ORDER — OXYCODONE-ACETAMINOPHEN 5-325 MG PO TABS: 1.0000 | ORAL_TABLET | Freq: Four times a day (QID) | ORAL | 0 refills | Status: DC | PRN

## 2016-05-14 MED ORDER — INSULIN ASPART 100 UNIT/ML ~~LOC~~ SOLN: 2.0000 [IU] | Freq: Three times a day (TID) | SUBCUTANEOUS | Status: DC

## 2016-05-14 MED ORDER — LIDOCAINE-EPINEPHRINE (PF) 1 %-1:200000 IJ SOLN
INTRAMUSCULAR | Status: AC
  Filled 2016-05-14: qty 30

## 2016-05-14 MED ORDER — LIDOCAINE 2% (20 MG/ML) 5 ML SYRINGE
INTRAMUSCULAR | Status: DC | PRN
  Administered 2016-05-14: 100 mg via INTRAVENOUS

## 2016-05-14 MED ORDER — SODIUM CHLORIDE 0.9 % IV SOLN
INTRAVENOUS | Status: DC | PRN
  Administered 2016-05-14: 07:00:00 via INTRAVENOUS

## 2016-05-14 MED ORDER — LIDOCAINE 2% (20 MG/ML) 5 ML SYRINGE
INTRAMUSCULAR | Status: AC
  Filled 2016-05-14: qty 5

## 2016-05-14 MED ORDER — SODIUM CHLORIDE 0.9 % IV SOLN
INTRAVENOUS | Status: DC | PRN
  Administered 2016-05-14: 08:00:00

## 2016-05-14 MED ORDER — LIDOCAINE HCL (PF) 1 % IJ SOLN
INTRAMUSCULAR | Status: AC
  Filled 2016-05-14: qty 30

## 2016-05-14 MED ORDER — EPHEDRINE SULFATE 50 MG/ML IJ SOLN
INTRAMUSCULAR | Status: DC | PRN
  Administered 2016-05-14 (×2): 5 mg via INTRAVENOUS
  Administered 2016-05-14: 10 mg via INTRAVENOUS
  Administered 2016-05-14 (×2): 5 mg via INTRAVENOUS
  Administered 2016-05-14: 10 mg via INTRAVENOUS
  Administered 2016-05-14: 5 mg via INTRAVENOUS

## 2016-05-14 MED ORDER — ONDANSETRON HCL 4 MG/2ML IJ SOLN
INTRAMUSCULAR | Status: AC
  Filled 2016-05-14: qty 2

## 2016-05-14 MED ORDER — PROPOFOL 10 MG/ML IV BOLUS
INTRAVENOUS | Status: AC
  Filled 2016-05-14: qty 40

## 2016-05-14 MED ORDER — CEFUROXIME SODIUM 1.5 G IJ SOLR
1.5000 g | INTRAMUSCULAR | Status: AC
  Administered 2016-05-14: 1.5 g via INTRAVENOUS
  Filled 2016-05-14: qty 1.5

## 2016-05-14 MED ORDER — MIDAZOLAM HCL 2 MG/2ML IJ SOLN
INTRAMUSCULAR | Status: AC
  Filled 2016-05-14: qty 2

## 2016-05-14 SURGICAL SUPPLY — 37 items
ARMBAND PINK RESTRICT EXTREMIT (MISCELLANEOUS) ×3 IMPLANT
CANISTER SUCTION 2500CC (MISCELLANEOUS) ×3 IMPLANT
CLIP TI MEDIUM 24 (CLIP) ×3 IMPLANT
CLIP TI MEDIUM 6 (CLIP) IMPLANT
CLIP TI WIDE RED SMALL 24 (CLIP) ×3 IMPLANT
CLIP TI WIDE RED SMALL 6 (CLIP) IMPLANT
COVER PROBE W GEL 5X96 (DRAPES) ×3 IMPLANT
DERMABOND ADVANCED (GAUZE/BANDAGES/DRESSINGS) ×6
DERMABOND ADVANCED .7 DNX12 (GAUZE/BANDAGES/DRESSINGS) ×3 IMPLANT
ELECT CAUTERY BLADE 6.4 (BLADE) ×3 IMPLANT
ELECT REM PT RETURN 9FT ADLT (ELECTROSURGICAL) ×3
ELECTRODE REM PT RTRN 9FT ADLT (ELECTROSURGICAL) ×1 IMPLANT
GLOVE BIO SURGEON STRL SZ 6.5 (GLOVE) ×4 IMPLANT
GLOVE BIO SURGEON STRL SZ7 (GLOVE) ×3 IMPLANT
GLOVE BIO SURGEON STRL SZ7.5 (GLOVE) ×3 IMPLANT
GLOVE BIO SURGEONS STRL SZ 6.5 (GLOVE) ×2
GLOVE BIOGEL PI IND STRL 6.5 (GLOVE) ×1 IMPLANT
GLOVE BIOGEL PI IND STRL 7.0 (GLOVE) ×1 IMPLANT
GLOVE BIOGEL PI INDICATOR 6.5 (GLOVE) ×2
GLOVE BIOGEL PI INDICATOR 7.0 (GLOVE) ×2
GOWN STRL REUS W/ TWL LRG LVL3 (GOWN DISPOSABLE) ×2 IMPLANT
GOWN STRL REUS W/ TWL XL LVL3 (GOWN DISPOSABLE) ×1 IMPLANT
GOWN STRL REUS W/TWL LRG LVL3 (GOWN DISPOSABLE) ×4
GOWN STRL REUS W/TWL XL LVL3 (GOWN DISPOSABLE) ×2
KIT BASIN OR (CUSTOM PROCEDURE TRAY) ×3 IMPLANT
KIT ROOM TURNOVER OR (KITS) ×3 IMPLANT
NS IRRIG 1000ML POUR BTL (IV SOLUTION) ×3 IMPLANT
PACK CV ACCESS (CUSTOM PROCEDURE TRAY) ×3 IMPLANT
PAD ARMBOARD 7.5X6 YLW CONV (MISCELLANEOUS) ×6 IMPLANT
PENCIL BUTTON HOLSTER BLD 10FT (ELECTRODE) ×3 IMPLANT
SUT MNCRL AB 4-0 PS2 18 (SUTURE) ×9 IMPLANT
SUT PROLENE 6 0 BV (SUTURE) ×6 IMPLANT
SUT SILK 2 0 SH (SUTURE) ×6 IMPLANT
SUT VIC AB 3-0 SH 27 (SUTURE) ×6
SUT VIC AB 3-0 SH 27X BRD (SUTURE) ×3 IMPLANT
UNDERPAD 30X30 (UNDERPADS AND DIAPERS) ×3 IMPLANT
WATER STERILE IRR 1000ML POUR (IV SOLUTION) ×3 IMPLANT

## 2016-05-14 NOTE — Anesthesia Postprocedure Evaluation (Signed)
Anesthesia Post Note  Patient: Russell Price  Procedure(s) Performed: Procedure(s) (LRB): SECOND STAGE LEFT BASILIC VEIN TRANSPOSITION (Left)  Patient location during evaluation: PACU Anesthesia Type: General Level of consciousness: awake and alert Pain management: pain level controlled Vital Signs Assessment: post-procedure vital signs reviewed and stable Respiratory status: spontaneous breathing, nonlabored ventilation, respiratory function stable and patient connected to nasal cannula oxygen Cardiovascular status: blood pressure returned to baseline and stable Postop Assessment: no signs of nausea or vomiting Anesthetic complications: no       Last Vitals:  Vitals:   05/14/16 0955 05/14/16 1010  BP: (!) 153/68 (!) 158/82  Pulse: 61 (!) 58  Resp: 13 17  Temp:      Last Pain: There were no vitals filed for this visit.               [place]

## 2016-05-14 NOTE — Telephone Encounter (Signed)
-----   Message from Mena Goes, RN sent at 05/14/2016  9:35 AM EST ----- Regarding: 4-6 weeks postop from 05-14-16   ----- Message ----- From: Gabriel Earing, PA-C Sent: 05/14/2016   9:20 AM To: Vvs Charge Pool  S/p left 2nd stage BVT 05/14/16.  F/u with Dr. Donzetta Matters in 4-6 weeks.  No duplex needed.  Thanks, Aldona Bar

## 2016-05-14 NOTE — Anesthesia Preprocedure Evaluation (Signed)
Anesthesia Evaluation  Patient identified by MRN, date of birth, ID band Patient awake    Reviewed: Allergy & Precautions, NPO status , Patient's Chart, lab work & pertinent test results  Airway Mallampati: I  TM Distance: >3 FB Neck ROM: Full    Dental   Pulmonary former smoker,    Pulmonary exam normal        Cardiovascular hypertension, Normal cardiovascular exam     Neuro/Psych    GI/Hepatic   Endo/Other  diabetes  Renal/GU Dialysis and ESRFRenal disease     Musculoskeletal   Abdominal   Peds  Hematology   Anesthesia Other Findings   Reproductive/Obstetrics                             Anesthesia Physical Anesthesia Plan  ASA: III  Anesthesia Plan: General   Post-op Pain Management:    Induction: Intravenous  Airway Management Planned: LMA  Additional Equipment:   Intra-op Plan:   Post-operative Plan: Extubation in OR  Informed Consent: I have reviewed the patients History and Physical, chart, labs and discussed the procedure including the risks, benefits and alternatives for the proposed anesthesia with the patient or authorized representative who has indicated his/her understanding and acceptance.     Plan Discussed with: CRNA and Surgeon  Anesthesia Plan Comments:         Anesthesia Quick Evaluation

## 2016-05-14 NOTE — H&P (Signed)
HP   History of Present Illness: This is a 55 y.o. male returns with ckd not yet on dialysis. Has 1st stage left bvt and needs 2nd stage. No numbness or pain in hand or arm.  Past Medical History:  Diagnosis Date  . CAD (coronary artery disease)    a. underwent cath in 02/2016 after an abnormal nuc which showed 2V CAD in RCA and OM3 with no good targets for PCI. Medical managment recommended.   . Chronic kidney disease   . Diabetes mellitus without complication ([place])    followed by Dr Chalmers Cater  . ESRD (end stage renal disease) ([place])   . Glaucoma   . Hypertension    followed by [facility]  . Stroke [facility])     Past Surgical History:  Procedure Laterality Date  . AV FISTULA PLACEMENT Left 03/12/2016   Procedure: Left Arm ARTERIOVENOUS (AV) FISTULA CREATION;  Surgeon: Waynetta Sandy, MD;  Location: [address];  Service: Vascular;  Laterality: Left;  . BRONCHOSCOPY  02/14/2015   for pulm hemorrhage  . CARDIAC CATHETERIZATION N/A 03/04/2016   Procedure: Left Heart Cath and Coronary Angiography;  Surgeon: Leonie Man, MD;  Location: [place] [facility];  Service: Cardiovascular;  Laterality: N/A;  . EYE SURGERY Left     Allergies  Allergen Reactions  . No Known Allergies     Prior to Admission medications   Medication Sig Start Date End Date Taking? Authorizing Provider  aspirin 81 MG chewable tablet Chew 1 tablet (81 mg total) by mouth daily. 09/14/15  Yes Daniel J Angiulli, PA-C  atorvastatin (LIPITOR) 40 MG tablet Take 1 tablet (40 mg total) by mouth daily at 6 PM. Patient taking differently: Take 40 mg by mouth daily.  05/01/16  Yes Arnoldo Morale, MD  b complex-vitamin c-folic acid (NEPHRO-VITE) 0.8 MG TABS tablet Take 1 tablet by mouth at bedtime.   Yes Historical Provider, MD  carvedilol (COREG) 6.25 MG tablet Take 1 tablet (6.25 mg total) by mouth 2 (two) times daily with a meal. 05/01/16  Yes Arnoldo Morale, MD  chlorthalidone (HYGROTON) 25 MG tablet Take  25 mg by mouth daily. 04/29/16  Yes Historical Provider, MD  furosemide (LASIX) 20 MG tablet Take 1 tablet (20 mg total) by mouth daily. 05/01/16  Yes Arnoldo Morale, MD  gabapentin (NEURONTIN) 300 MG capsule Take 300 mg by mouth daily.  04/03/16  Yes Historical Provider, MD  insulin aspart (NOVOLOG) 100 UNIT/ML injection Inject 0-12 Units into the skin 3 (three) times daily before meals. Patient taking differently: Inject 2-10 Units into the skin 3 (three) times daily before meals.  05/01/16  Yes Arnoldo Morale, MD  insulin glargine (LANTUS) 100 UNIT/ML injection Inject 0.4 mLs (40 Units total) into the skin daily. Patient taking differently: Inject 10 Units into the skin daily.  05/01/16  Yes Arnoldo Morale, MD  NIFEdipine (PROCARDIA XL/ADALAT-CC) 90 MG 24 hr tablet Take 90 mg by mouth daily. 04/29/16  Yes Historical Provider, MD  PREDNISOLONE ACETATE OP Apply 1 drop to eye daily.    Yes Historical Provider, MD  Blood Glucose Monitoring Suppl (ACCU-CHEK AVIVA) device Use as instructed 3 times daily before meals and at bedtime. 12/18/15   Arnoldo Morale, MD  glucose blood (ACCU-CHEK AVIVA PLUS) test strip Use 3 times daily before meals and at bedtime 12/18/15   Arnoldo Morale, MD  Lancet Devices [facility]) lancets Use as instructed 3 times daily before meals and at bedtime 12/18/15   Arnoldo Morale, MD  NIFEdipine (PROCARDIA-XL/ADALAT CC) 60 MG 24 hr tablet Take 2 tablets (120 mg total) by mouth daily. Patient not taking: Reported on 05/09/2016 05/01/16   Arnoldo Morale, MD    Social History   Social History  . Marital status: Single    Spouse name: N/A  . Number of children: N/A  . Years of education: N/A   Occupational History  . Not on file.   Social History Main Topics  . Smoking status: Former Smoker    Packs/day: 0.00    Years: 4.00    Quit date: 02/23/1983  . Smokeless tobacco: Never Used  . Alcohol use No  . Drug use: No  . Sexual activity: Not on file   Other Topics Concern  .  Not on file   Social History Narrative  . No narrative on file     Family History  Problem Relation Age of Onset  . Diabetes Mother       Physical Examination  Vitals:   05/14/16 0608  BP: (!) 211/86  Pulse: 69  Resp: 20  Temp: 97.9 F (36.6 C)   There is no height or weight on file to calculate BMI.  General:  WDWN in NAD Gait: Not observed HENT: WNL, normocephalic Pulmonary: non - labored Cardiac: cannot palpate left radial pulse, palpable left brachial artery Extremities: well healed left arm incision Musculoskeletal: no muscle wasting or atrophy  Neurologic: A&O X 3; Appropriate Affect ; SENSATION: normal; MOTOR FUNCTION:  moving all extremities equally. Speech is fluent/normal   CBC    Component Value Date/Time   WBC 5.1 03/03/2016 1610   RBC 3.77 (L) 03/03/2016 1610   HGB 10.2 (L) 05/14/2016 0624   HCT 30.0 (L) 05/14/2016 0624   PLT 125 (L) 03/03/2016 1610   MCV 80.9 03/03/2016 1610   MCV 85.0 02/27/2015 1458   MCH 27.6 03/03/2016 1610   MCHC 34.1 03/03/2016 1610   RDW 14.6 03/03/2016 1610   LYMPHSABS 1.6 03/03/2016 1610   MONOABS 0.5 03/03/2016 1610   EOSABS 0.1 03/03/2016 1610   BASOSABS 0.0 03/03/2016 1610    BMET    Component Value Date/Time   NA 139 05/14/2016 0624   K 4.6 05/14/2016 0624   CL 112 (H) 03/05/2016 1000   CO2 19 (L) 03/05/2016 1000   GLUCOSE 153 (H) 05/14/2016 0624   BUN 79 (H) 03/05/2016 1000   CREATININE 4.64 (H) 03/05/2016 1000   CREATININE 4.38 (H) 03/01/2016 0856   CALCIUM 8.5 (L) 03/05/2016 1000   GFRNONAA 13 (L) 03/05/2016 1000   GFRNONAA 16 (L) 09/21/2015 1129   GFRAA 15 (L) 03/05/2016 1000   GFRAA 19 (L) 09/21/2015 1129    COAGS: Lab Results  Component Value Date   INR 1.0 03/01/2016   INR 1.29 09/03/2015   INR 1.18 02/22/2015     ASSESSMENT/PLAN: This is a 55 y.o. male here for second stage left bvt. All questions answered. Consent signed.  Chalese Peach C. Donzetta Matters, MD [facility]  [place] Office: [phone] Pager: [phone]

## 2016-05-14 NOTE — Transfer of Care (Signed)
Immediate Anesthesia Transfer of Care Note  Patient: Russell Price  Procedure(s) Performed: Procedure(s): SECOND STAGE LEFT BASILIC VEIN TRANSPOSITION (Left)  Patient Location: PACU  Anesthesia Type:General  Level of Consciousness: awake, alert  and oriented  Airway & Oxygen Therapy: Patient Spontanous Breathing and Patient connected to face mask oxygen  Post-op Assessment: Report given to RN and Post -op Vital signs reviewed and stable  Post vital signs: Reviewed and stable  Last Vitals:  Vitals:   05/14/16 0608  BP: (!) 211/86  Pulse: 69  Resp: 20  Temp: 36.6 C    Last Pain: There were no vitals filed for this visit.    Patients Stated Pain Goal: 3 (80/04/47 1580)  Complications: No apparent anesthesia complications

## 2016-05-14 NOTE — Telephone Encounter (Signed)
Sched appt 06/14/16 at 9:30. Spoke to pt to inform them of appt.

## 2016-05-14 NOTE — Discharge Instructions (Signed)
° ° °  05/14/2016 RITTER HELSLEY [id number] [birth year]  Surgeon(s): Waynetta Sandy, MD  Procedure(s): SECOND STAGE LEFT BASILIC VEIN TRANSPOSITION  x Do not stick fistula for 8 weeks

## 2016-05-14 NOTE — Anesthesia Procedure Notes (Signed)
Procedure Name: LMA Insertion Date/Time: 05/14/2016 7:38 AM Performed by: Candis Shine Pre-anesthesia Checklist: Patient identified, Emergency Drugs available, Suction available and Patient being monitored Patient Re-evaluated:Patient Re-evaluated prior to inductionOxygen Delivery Method: Circle System Utilized Preoxygenation: Pre-oxygenation with 100% oxygen Intubation Type: IV induction Ventilation: Mask ventilation without difficulty LMA: LMA inserted LMA Size: 4.0 Number of attempts: 1 Airway Equipment and Method: Bite block Placement Confirmation: positive ETCO2 Tube secured with: Tape Dental Injury: Teeth and Oropharynx as per pre-operative assessment

## 2016-05-14 NOTE — Op Note (Signed)
    OPERATIVE NOTE   PROCEDURE: Left 2nd stage basilic vein transposition av fistula  PRE-OPERATIVE DIAGNOSIS: chronic kidney disease  POST-OPERATIVE DIAGNOSIS: same  SURGEON: Rissa Turley C. Donzetta Matters, MD  ASSISTANT(S): Leontine Locket, PA  ANESTHESIA: general  ESTIMATED BLOOD LOSS: 25 cc  FINDING(S): Large vein with palbable thrill at completion    INDICATIONS:   Russell Price is a 55 y.o. male who presents with chronic kidney disease he has undergone first stage basilic vein transposition and is now indicated for a second stage.  DESCRIPTION: After full informed written consent was obtained from the patient, the patient was brought back to the operating room and placed supine upon the operating table.  Prior to induction, the patient received IV antibiotics.   After obtaining adequate anesthesia, the patient was then prepped and draped in the standard fashion for a left arm access procedure. An incision overlying the previous incision was made and dissected down to identify the basilic vein. The nerve was protected. I then traced the vein to the axilla via 3 skip incisions riding branches between ties. The vein was then transected near the antecubital flushed and orientation marked. The brachial artery was then dissected free by dividing the fascia vessel loop placed around that. Tunneler was passed between the axilla and the antecubitum and the vein was tunneled in its anatomic position. The artery was clamped proximally and distally and opened longitudinally. Heparinized saline was instilled proximally and distally. The vein was then sewn end-to-side with 6-0 Prolene suture. Prior to releasing clamps flush maneuvers were performed. Upon releasing it was a palpable thrill in the vein and a signal at the radial artery at the level of the wrist was multiphasic. Hemostasis obtained and the wounds were closed with Vicryl 4-0 Monocryl. It was then allowed to awaken from anesthesia having  tolerated the procedure well and stable condition transferred to the post anesthesia care.   Carolene Gitto C. Donzetta Matters, MD [facility] [place] Office: [phone] Pager: [phone]  05/14/2016, 9:49 AM \

## 2016-05-15 ENCOUNTER — Encounter (HOSPITAL_COMMUNITY): Payer: Self-pay | Admitting: Vascular Surgery

## 2016-05-16 ENCOUNTER — Other Ambulatory Visit: Payer: Self-pay | Admitting: Family Medicine

## 2016-05-16 DIAGNOSIS — R0609 Other forms of dyspnea: Secondary | ICD-10-CM

## 2016-05-16 MED FILL — CARVEDILOL 6.25 MG TABLET: 6.25 | 30 days supply | Qty: 60 | Fill #0

## 2016-05-16 MED FILL — RENA-VITE TABLET: 30 days supply | Qty: 30 | Fill #5

## 2016-05-16 MED FILL — ACCU-CHEK AVIVA PLUS TEST S: 25 days supply | Qty: 100 | Fill #5

## 2016-05-21 NOTE — Telephone Encounter (Signed)
This was meant to be for a short duration and he was to come back to the clinic in 2 weeks for a reassessment. He will also need to run this by his nephrologist as Lasix can worsen his renal function.

## 2016-05-22 MED FILL — CARVEDILOL 12.5 MG TABLET: 12.5 | 30 days supply | Qty: 60 | Fill #0

## 2016-05-24 ENCOUNTER — Ambulatory Visit: Payer: Medicaid Other | Attending: Family Medicine | Admitting: Family Medicine

## 2016-05-24 ENCOUNTER — Encounter: Payer: Self-pay | Admitting: Family Medicine

## 2016-05-24 VITALS — BP 175/75 | HR 76 | Temp 97.9°F | Ht 71.0 in | Wt 184.6 lb

## 2016-05-24 DIAGNOSIS — Z7982 Long term (current) use of aspirin: Secondary | ICD-10-CM | POA: Diagnosis not present

## 2016-05-24 DIAGNOSIS — I120 Hypertensive chronic kidney disease with stage 5 chronic kidney disease or end stage renal disease: Secondary | ICD-10-CM | POA: Insufficient documentation

## 2016-05-24 DIAGNOSIS — Z955 Presence of coronary angioplasty implant and graft: Secondary | ICD-10-CM | POA: Insufficient documentation

## 2016-05-24 DIAGNOSIS — E1140 Type 2 diabetes mellitus with diabetic neuropathy, unspecified: Secondary | ICD-10-CM | POA: Insufficient documentation

## 2016-05-24 DIAGNOSIS — I10 Essential (primary) hypertension: Secondary | ICD-10-CM | POA: Diagnosis not present

## 2016-05-24 DIAGNOSIS — I2510 Atherosclerotic heart disease of native coronary artery without angina pectoris: Secondary | ICD-10-CM | POA: Diagnosis not present

## 2016-05-24 DIAGNOSIS — E1122 Type 2 diabetes mellitus with diabetic chronic kidney disease: Secondary | ICD-10-CM | POA: Diagnosis present

## 2016-05-24 DIAGNOSIS — N184 Chronic kidney disease, stage 4 (severe): Secondary | ICD-10-CM | POA: Diagnosis not present

## 2016-05-24 DIAGNOSIS — H5461 Unqualified visual loss, right eye, normal vision left eye: Secondary | ICD-10-CM | POA: Diagnosis not present

## 2016-05-24 DIAGNOSIS — Z9889 Other specified postprocedural states: Secondary | ICD-10-CM | POA: Diagnosis not present

## 2016-05-24 DIAGNOSIS — H409 Unspecified glaucoma: Secondary | ICD-10-CM | POA: Insufficient documentation

## 2016-05-24 DIAGNOSIS — Z8673 Personal history of transient ischemic attack (TIA), and cerebral infarction without residual deficits: Secondary | ICD-10-CM | POA: Diagnosis not present

## 2016-05-24 DIAGNOSIS — N186 End stage renal disease: Secondary | ICD-10-CM | POA: Diagnosis not present

## 2016-05-24 DIAGNOSIS — Z794 Long term (current) use of insulin: Secondary | ICD-10-CM | POA: Insufficient documentation

## 2016-05-24 DIAGNOSIS — E118 Type 2 diabetes mellitus with unspecified complications: Secondary | ICD-10-CM

## 2016-05-24 NOTE — Progress Notes (Signed)
Ran out of the nifedipine and lasix 3 days ago- refilling it today

## 2016-05-24 NOTE — Progress Notes (Signed)
Subjective:  Patient ID: Russell Price, male    DOB: 06/24/1960  Age: 56 y.o. MRN: [id number]  CC: Diabetes   HPI Russell Price  is a 56 year old male with a history of hypertension, uncontrolled type 2 diabetes mellitus (A1c 7.2), diabetic neuropathy, blind in the right eye, stage IV chronic kidney disease who comes in today for follow-up of edema and shortness of breath.  He received a 2 week course of Lasix at his last visit for facial edema and dyspnea and reports resolution of symptoms.  He is status post left basic vein transposition by vascular surgery on 05/14/16 and tolerated the procedure well. His blood pressure is elevated and he admits to running out of his Nifedipine.  Past Medical History:  Diagnosis Date  . CAD (coronary artery disease)    a. underwent cath in 02/2016 after an abnormal nuc which showed 2V CAD in RCA and OM3 with no good targets for PCI. Medical managment recommended.   . Chronic kidney disease   . Diabetes mellitus without complication ([place])    followed by Dr Chalmers Cater  . ESRD (end stage renal disease) ([place])   . Glaucoma   . Hypertension    followed by [facility]  . Stroke [facility])     Past Surgical History:  Procedure Laterality Date  . AV FISTULA PLACEMENT Left 03/12/2016   Procedure: Left Arm ARTERIOVENOUS (AV) FISTULA CREATION;  Surgeon: Waynetta Sandy, MD;  Location: [place];  Service: Vascular;  Laterality: Left;  . BASCILIC VEIN TRANSPOSITION Left 05/14/2016   Procedure: SECOND STAGE LEFT BASILIC VEIN TRANSPOSITION;  Surgeon: Waynetta Sandy, MD;  Location: [place];  Service: Vascular;  Laterality: Left;  . BRONCHOSCOPY  02/14/2015   for pulm hemorrhage  . CARDIAC CATHETERIZATION N/A 03/04/2016   Procedure: Left Heart Cath and Coronary Angiography;  Surgeon: Leonie Man, MD;  Location: [place] [facility];  Service: Cardiovascular;  Laterality: N/A;  . EYE SURGERY Left     .all   Outpatient  Medications Prior to Visit  Medication Sig Dispense Refill  . aspirin 81 MG chewable tablet Chew 1 tablet (81 mg total) by mouth daily.    Marland Kitchen atorvastatin (LIPITOR) 40 MG tablet Take 1 tablet (40 mg total) by mouth daily at 6 PM. (Patient taking differently: Take 40 mg by mouth daily. ) 30 tablet 5  . b complex-vitamin c-folic acid (NEPHRO-VITE) 0.8 MG TABS tablet Take 1 tablet by mouth at bedtime.    . Blood Glucose Monitoring Suppl (ACCU-CHEK AVIVA) device Use as instructed 3 times daily before meals and at bedtime. 1 each 0  . carvedilol (COREG) 6.25 MG tablet Take 1 tablet (6.25 mg total) by mouth 2 (two) times daily with a meal. 60 tablet 6  . chlorthalidone (HYGROTON) 25 MG tablet Take 25 mg by mouth daily.  5  . gabapentin (NEURONTIN) 300 MG capsule Take 300 mg by mouth daily.   5  . glucose blood (ACCU-CHEK AVIVA PLUS) test strip Use 3 times daily before meals and at bedtime 100 each 12  . insulin aspart (NOVOLOG) 100 UNIT/ML injection Inject 2-10 Units into the skin 3 (three) times daily before meals.    . insulin glargine (LANTUS) 100 UNIT/ML injection Inject 0.1 mLs (10 Units total) into the skin daily.    Elmore Guise Devices (ACCU-CHEK SOFTCLIX) lancets Use as instructed 3 times daily before meals and at bedtime 1 each 12  . PREDNISOLONE ACETATE OP Apply 1 drop to  eye daily.     Marland Kitchen NIFEdipine (PROCARDIA XL/ADALAT-CC) 90 MG 24 hr tablet Take 90 mg by mouth daily.  5  . furosemide (LASIX) 20 MG tablet Take 1 tablet (20 mg total) by mouth daily. (Patient not taking: Reported on 05/24/2016) 15 tablet 0  . oxyCODONE-acetaminophen (ROXICET) 5-325 MG tablet Take 1 tablet by mouth every 6 (six) hours as needed for severe pain. 10 tablet 0   No facility-administered medications prior to visit.     ROS Review of Systems  Constitutional: Negative for activity change and appetite change.  HENT: Negative for sinus pressure and sore throat.   Respiratory: Negative for chest tightness, shortness of  breath and wheezing.   Cardiovascular: Negative for chest pain and palpitations.  Gastrointestinal: Negative for abdominal distention, abdominal pain and constipation.  Genitourinary: Negative.   Musculoskeletal: Negative.   Psychiatric/Behavioral: Negative for behavioral problems and dysphoric mood.    Objective:  BP (!) 175/75 (BP Location: Right Arm, Patient Position: Sitting, Cuff Size: Large)   Pulse 76   Temp 97.9 F (36.6 C) (Oral)   Ht 5\' 11"  (1.803 m)   Wt 184 lb 9.6 oz (83.7 kg)   SpO2 100%   BMI 25.75 kg/m   BP/Weight 05/24/2016 05/14/2016 96/78/9381  Systolic BP 017 510 258  Diastolic BP 75 71 67  Wt. (Lbs) 184.6 - 183  BMI 25.75 - 25.89      Physical Exam  Constitutional: He is oriented to person, place, and time. He appears well-developed and well-nourished.  Cardiovascular: Normal rate, normal heart sounds and intact distal pulses.   No murmur heard. Pulmonary/Chest: Effort normal and breath sounds normal. He has no wheezes. He has no rales. He exhibits no tenderness.  Abdominal: Soft. Bowel sounds are normal. He exhibits no distension and no mass. There is no tenderness.  Musculoskeletal:  L medial upper arm with palpable thrill, surrounding edema  Neurological: He is alert and oriented to person, place, and time.     Lab Results  Component Value Date   HGBA1C 7.2 05/01/2016    Assessment & Plan:   1. Essential hypertension Uncontrolled due to running out of nifedipine Compliance emphasized Low-sodium diet  2. Chronic renal insufficiency, stage IV (severe) (HCC) Status post left second stage basilic vein transposition AV fistula Follow up with vascular surgery and Nephrology  3. DM (diabetes mellitus) with complications ([place]) Controlled with A1c of 7.2 Continue medications and ADA diet   No orders of the defined types were placed in this encounter.   Follow-up: Return in about 3 months (around 08/22/2016) for follow-up of Diabetes mellitus.     Arnoldo Morale MD

## 2016-05-29 ENCOUNTER — Other Ambulatory Visit (HOSPITAL_COMMUNITY): Payer: Self-pay | Admitting: *Deleted

## 2016-05-30 ENCOUNTER — Ambulatory Visit (HOSPITAL_COMMUNITY)
Admission: RE | Admit: 2016-05-30 | Discharge: 2016-05-30 | Disposition: A | Discharge status: Home / Self Care | Payer: Medicaid Other | Source: Ambulatory Visit | Attending: Nephrology | Admitting: Nephrology

## 2016-05-30 DIAGNOSIS — D509 Iron deficiency anemia, unspecified: Secondary | ICD-10-CM | POA: Insufficient documentation

## 2016-05-30 MED ORDER — SODIUM CHLORIDE 0.9 % IV SOLN
510.0000 mg | INTRAVENOUS | Status: DC
  Administered 2016-05-30: 10:00:00 510 mg via INTRAVENOUS
  Filled 2016-05-30: qty 17

## 2016-05-30 NOTE — Discharge Instructions (Signed)
Ferumoxytol injection What is this medicine? FERUMOXYTOL is an iron complex. Iron is used to make healthy red blood cells, which carry oxygen and nutrients throughout the body. This medicine is used to treat iron deficiency anemia in people with chronic kidney disease. COMMON BRAND NAME(S): Feraheme What should I tell my health care provider before I take this medicine? They need to know if you have any of these conditions: -anemia not caused by low iron levels -high levels of iron in the blood -magnetic resonance imaging (MRI) test scheduled -an unusual or allergic reaction to iron, other medicines, foods, dyes, or preservatives -pregnant or trying to get pregnant -breast-feeding How should I use this medicine? This medicine is for injection into a vein. It is given by a health care professional in a hospital or clinic setting. Talk to your pediatrician regarding the use of this medicine in children. Special care may be needed. What if I miss a dose? It is important not to miss your dose. Call your doctor or health care professional if you are unable to keep an appointment. What may interact with this medicine? This medicine may interact with the following medications: -other iron products What should I watch for while using this medicine? Visit your doctor or healthcare professional regularly. Tell your doctor or healthcare professional if your symptoms do not start to get better or if they get worse. You may need blood work done while you are taking this medicine. You may need to follow a special diet. Talk to your doctor. Foods that contain iron include: whole grains/cereals, dried fruits, beans, or peas, leafy green vegetables, and organ meats (liver, kidney). What side effects may I notice from receiving this medicine? Side effects that you should report to your doctor or health care professional as soon as possible: -allergic reactions like skin rash, itching or hives, swelling of the  face, lips, or tongue -breathing problems -changes in blood pressure -feeling faint or lightheaded, falls -fever or chills -flushing, sweating, or hot feelings -swelling of the ankles or feet Side effects that usually do not require medical attention (report to your doctor or health care professional if they continue or are bothersome): -diarrhea -headache -nausea, vomiting -stomach pain Where should I keep my medicine? This drug is given in a hospital or clinic and will not be stored at home.  2017 Elsevier/Gold Standard (2015-06-08 12:41:49)  

## 2016-06-04 MED FILL — CHLORTHALIDONE 25 MG TABLET: 25 | 30 days supply | Qty: 30 | Fill #2

## 2016-06-04 MED FILL — GABAPENTIN 300 MG CAPSULE: 300 | 30 days supply | Qty: 60 | Fill #3

## 2016-06-05 ENCOUNTER — Ambulatory Visit (HOSPITAL_COMMUNITY): Admission: RE | Admit: 2016-06-05 | Payer: Medicaid Other | Source: Ambulatory Visit

## 2016-06-10 ENCOUNTER — Telehealth: Payer: Self-pay

## 2016-06-10 MED FILL — ATORVASTATIN 40 MG TABLET: 40 | 30 days supply | Qty: 30 | Fill #4

## 2016-06-10 NOTE — Telephone Encounter (Signed)
Attempted to call pt back. Pt was concerned of upcoming post op appointment. No voice mail set up, could not leave message.

## 2016-06-11 ENCOUNTER — Ambulatory Visit (HOSPITAL_COMMUNITY)
Admission: RE | Admit: 2016-06-11 | Discharge: 2016-06-11 | Disposition: A | Discharge status: Home / Self Care | Payer: Medicaid Other | Source: Ambulatory Visit | Attending: Nephrology | Admitting: Nephrology

## 2016-06-11 ENCOUNTER — Encounter: Payer: Self-pay | Admitting: Vascular Surgery

## 2016-06-11 DIAGNOSIS — D509 Iron deficiency anemia, unspecified: Secondary | ICD-10-CM | POA: Diagnosis present

## 2016-06-11 MED ORDER — SODIUM CHLORIDE 0.9 % IV SOLN
510.0000 mg | INTRAVENOUS | Status: DC
  Administered 2016-06-11: 11:00:00 510 mg via INTRAVENOUS
  Filled 2016-06-11: qty 17

## 2016-06-11 MED FILL — ACCU-CHEK AVIVA PLUS TEST S: 25 days supply | Qty: 100 | Fill #6

## 2016-06-14 ENCOUNTER — Ambulatory Visit (INDEPENDENT_AMBULATORY_CARE_PROVIDER_SITE_OTHER): Payer: Self-pay | Admitting: Vascular Surgery

## 2016-06-14 VITALS — BP 142/78 | HR 68 | Temp 98.1°F | Resp 16 | Ht 71.0 in | Wt 185.0 lb

## 2016-06-14 DIAGNOSIS — N184 Chronic kidney disease, stage 4 (severe): Secondary | ICD-10-CM

## 2016-06-14 MED FILL — CARVEDILOL 6.25 MG TABLET: 6.25 | 30 days supply | Qty: 60 | Fill #1

## 2016-06-14 MED FILL — RENA-VITE TABLET: 30 days supply | Qty: 30 | Fill #6

## 2016-06-14 NOTE — Progress Notes (Signed)
Subjective:     Patient ID: FLEETWOOD PIERRON, male   DOB: Feb 25, 1961, 56 y.o.   MRN: [id number]  HPI 26-year-old male here for follow-up of second stage basilic vein transposition fistula. He has not had issues since surgery and is feeling fine. He has not started dialysis at this time states that his renal function is stable.   Review of Systems   No complaints regarding today's visit.   Objective:   Physical Exam  [id number] Left arm with mild edema at upper arm, incisions are well healed Fistula with pulsatility Left hand is warm with 5/5 motor  Assessment/Plan     56 year old male here for follow-up of second stage basilic vein transposition fistula. He is not on dialysis. Fistula does demonstrate pulsatility but as he is not needing to use it now I would not intervene which would require contrast. He may ultimately require fistulogram if there is trouble accessing it when it is needed. It should otherwise be ready for use bedtime needs dialysis and he can follow-up on a when necessary basis.  Ram Haugan C. Donzetta Matters, MD [facility] [place] Office: [phone] Pager: [phone]

## 2016-06-24 MED FILL — CARVEDILOL 12.5 MG TABLET: 12.5 | 30 days supply | Qty: 60 | Fill #1

## 2016-07-03 MED FILL — GABAPENTIN 300 MG CAPSULE: 300 | 30 days supply | Qty: 60 | Fill #4

## 2016-07-03 MED FILL — CHLORTHALIDONE 25 MG TABLET: 25 | 30 days supply | Qty: 30 | Fill #3

## 2016-07-08 MED FILL — CARVEDILOL 6.25 MG TABLET: 6.25 | 30 days supply | Qty: 60 | Fill #2

## 2016-07-10 MED FILL — ATORVASTATIN 40 MG TABLET: 40 | 30 days supply | Qty: 30 | Fill #5

## 2016-07-12 MED FILL — ACCU-CHEK AVIVA PLUS TEST S: 25 days supply | Qty: 100 | Fill #7

## 2016-07-15 MED FILL — RENA-VITE TABLET: 30 days supply | Qty: 30 | Fill #0

## 2016-07-22 MED FILL — LANTUS 100 UNITS/ML VIAL: 100 | 25 days supply | Qty: 10 | Fill #0

## 2016-07-22 MED FILL — CARVEDILOL 12.5 MG TABLET: 12.5 | 30 days supply | Qty: 60 | Fill #2

## 2016-07-25 MED FILL — NovoLOG 100 UNIT/ML SOLN: 100 | 28 days supply | Qty: 10 | Fill #0

## 2016-07-30 MED FILL — CHLORTHALIDONE 25 MG TABLET: 25 | 30 days supply | Qty: 30 | Fill #4

## 2016-08-02 MED FILL — ACCU-CHEK AVIVA PLUS TEST S: 25 days supply | Qty: 100 | Fill #8

## 2016-08-05 MED FILL — ATORVASTATIN 40 MG TABLET: 40 | 30 days supply | Qty: 30 | Fill #0

## 2016-08-05 MED FILL — CARVEDILOL 6.25 MG TABLET: 6.25 | 30 days supply | Qty: 60 | Fill #3

## 2016-08-13 MED FILL — GABAPENTIN 300 MG CAPSULE: 300 | 30 days supply | Qty: 60 | Fill #5

## 2016-08-14 MED FILL — ?FUROSEMIDE 80MG TABLET: 80 | 30 days supply | Qty: 60 | Fill #0

## 2016-08-21 MED FILL — RENA-VITE TABLET: 30 days supply | Qty: 30 | Fill #1

## 2016-08-21 MED FILL — CARVEDILOL 12.5 MG TABLET: 12.5 | 30 days supply | Qty: 60 | Fill #3

## 2016-08-23 MED FILL — !LANTUS 100 UNITS/ML VIAL: 100 | 25 days supply | Qty: 10 | Fill #1

## 2016-08-26 MED FILL — ATORVASTATIN 40 MG TABLET: 40 | 30 days supply | Qty: 30 | Fill #1

## 2016-08-26 MED FILL — ACCU-CHEK AVIVA PLUS TEST S: 25 days supply | Qty: 100 | Fill #9

## 2016-09-02 ENCOUNTER — Encounter: Payer: Self-pay | Admitting: Family Medicine

## 2016-09-02 ENCOUNTER — Ambulatory Visit: Payer: Medicaid Other | Attending: Family Medicine | Admitting: Family Medicine

## 2016-09-02 ENCOUNTER — Telehealth: Payer: Self-pay

## 2016-09-02 VITALS — BP 148/82 | HR 49 | Temp 97.5°F | Ht 70.08 in | Wt 193.0 lb

## 2016-09-02 DIAGNOSIS — Z992 Dependence on renal dialysis: Secondary | ICD-10-CM | POA: Insufficient documentation

## 2016-09-02 DIAGNOSIS — H409 Unspecified glaucoma: Secondary | ICD-10-CM | POA: Insufficient documentation

## 2016-09-02 DIAGNOSIS — E118 Type 2 diabetes mellitus with unspecified complications: Secondary | ICD-10-CM | POA: Diagnosis not present

## 2016-09-02 DIAGNOSIS — E0841 Diabetes mellitus due to underlying condition with diabetic mononeuropathy: Secondary | ICD-10-CM

## 2016-09-02 DIAGNOSIS — E7800 Pure hypercholesterolemia, unspecified: Secondary | ICD-10-CM | POA: Diagnosis not present

## 2016-09-02 DIAGNOSIS — Z794 Long term (current) use of insulin: Secondary | ICD-10-CM | POA: Diagnosis not present

## 2016-09-02 DIAGNOSIS — N186 End stage renal disease: Secondary | ICD-10-CM | POA: Diagnosis present

## 2016-09-02 DIAGNOSIS — I120 Hypertensive chronic kidney disease with stage 5 chronic kidney disease or end stage renal disease: Secondary | ICD-10-CM | POA: Diagnosis not present

## 2016-09-02 DIAGNOSIS — E1141 Type 2 diabetes mellitus with diabetic mononeuropathy: Secondary | ICD-10-CM | POA: Diagnosis not present

## 2016-09-02 DIAGNOSIS — I2510 Atherosclerotic heart disease of native coronary artery without angina pectoris: Secondary | ICD-10-CM | POA: Insufficient documentation

## 2016-09-02 DIAGNOSIS — Z955 Presence of coronary angioplasty implant and graft: Secondary | ICD-10-CM | POA: Insufficient documentation

## 2016-09-02 DIAGNOSIS — Z8673 Personal history of transient ischemic attack (TIA), and cerebral infarction without residual deficits: Secondary | ICD-10-CM | POA: Insufficient documentation

## 2016-09-02 DIAGNOSIS — Z7982 Long term (current) use of aspirin: Secondary | ICD-10-CM | POA: Insufficient documentation

## 2016-09-02 DIAGNOSIS — H5461 Unqualified visual loss, right eye, normal vision left eye: Secondary | ICD-10-CM | POA: Insufficient documentation

## 2016-09-02 DIAGNOSIS — I10 Essential (primary) hypertension: Secondary | ICD-10-CM

## 2016-09-02 DIAGNOSIS — E1122 Type 2 diabetes mellitus with diabetic chronic kidney disease: Secondary | ICD-10-CM | POA: Diagnosis not present

## 2016-09-02 DIAGNOSIS — N184 Chronic kidney disease, stage 4 (severe): Secondary | ICD-10-CM

## 2016-09-02 DIAGNOSIS — Z9889 Other specified postprocedural states: Secondary | ICD-10-CM | POA: Diagnosis not present

## 2016-09-02 LAB — POCT GLYCOSYLATED HEMOGLOBIN (HGB A1C): Hemoglobin A1C: 7.4

## 2016-09-02 LAB — GLUCOSE, POCT (MANUAL RESULT ENTRY): POC Glucose: 104 mg/dl — AB (ref 70–99)

## 2016-09-02 MED ORDER — ATORVASTATIN CALCIUM 40 MG PO TABS: 40.0000 mg | ORAL_TABLET | Freq: Every day | ORAL | 5 refills | Status: DC

## 2016-09-02 MED ORDER — INSULIN ASPART 100 UNIT/ML ~~LOC~~ SOLN: 2.0000 [IU] | Freq: Three times a day (TID) | SUBCUTANEOUS | 5 refills | Status: DC

## 2016-09-02 MED ORDER — CHLORTHALIDONE 25 MG PO TABS: 25.0000 mg | ORAL_TABLET | Freq: Every day | ORAL | 5 refills | Status: DC

## 2016-09-02 MED ORDER — GABAPENTIN 300 MG PO CAPS: 300.0000 mg | ORAL_CAPSULE | Freq: Every day | ORAL | 5 refills | Status: DC

## 2016-09-02 MED ORDER — CARVEDILOL 6.25 MG PO TABS: 6.2500 mg | ORAL_TABLET | Freq: Two times a day (BID) | ORAL | 6 refills | Status: DC

## 2016-09-02 MED ORDER — INSULIN GLARGINE 100 UNIT/ML ~~LOC~~ SOLN: 15.0000 [IU] | Freq: Every day | SUBCUTANEOUS | 5 refills | Status: DC

## 2016-09-02 MED ORDER — NIFEDIPINE ER OSMOTIC RELEASE 90 MG PO TB24: 90.0000 mg | ORAL_TABLET | Freq: Every day | ORAL | 5 refills | Status: DC

## 2016-09-02 MED FILL — NovoLOG 100 UNIT/ML SOLN: 100 | 32 days supply | Qty: 10 | Fill #0

## 2016-09-02 MED FILL — GABAPENTIN 300 MG CAPSULE: 300 | 30 days supply | Qty: 30 | Fill #0

## 2016-09-02 MED FILL — CHLORTHALIDONE 25 MG TABLET: 25 | 30 days supply | Qty: 30 | Fill #0

## 2016-09-02 MED FILL — CARVEDILOL 6.25 MG TABLET: 6.25 | 30 days supply | Qty: 60 | Fill #0

## 2016-09-02 MED FILL — LANTUS 100 UNITS/ML VIAL: 100 | 28 days supply | Qty: 10 | Fill #0

## 2016-09-02 NOTE — Patient Instructions (Signed)
Diabetes Mellitus and Food It is important for you to manage your blood sugar (glucose) level. Your blood glucose level can be greatly affected by what you eat. Eating healthier foods in the appropriate amounts throughout the day at about the same time each day will help you control your blood glucose level. It can also help slow or prevent worsening of your diabetes mellitus. Healthy eating may even help you improve the level of your blood pressure and reach or maintain a healthy weight. General recommendations for healthful eating and cooking habits include:  Eating meals and snacks regularly. Avoid going long periods of time without eating to lose weight.  Eating a diet that consists mainly of plant-based foods, such as fruits, vegetables, nuts, legumes, and whole grains.  Using low-heat cooking methods, such as baking, instead of high-heat cooking methods, such as deep frying.  Work with your dietitian to make sure you understand how to use the Nutrition Facts information on food labels. How can food affect me? Carbohydrates Carbohydrates affect your blood glucose level more than any other type of food. Your dietitian will help you determine how many carbohydrates to eat at each meal and teach you how to count carbohydrates. Counting carbohydrates is important to keep your blood glucose at a healthy level, especially if you are using insulin or taking certain medicines for diabetes mellitus. Alcohol Alcohol can cause sudden decreases in blood glucose (hypoglycemia), especially if you use insulin or take certain medicines for diabetes mellitus. Hypoglycemia can be a life-threatening condition. Symptoms of hypoglycemia (sleepiness, dizziness, and disorientation) are similar to symptoms of having too much alcohol. If your health care provider has given you approval to drink alcohol, do so in moderation and use the following guidelines:  Women should not have more than one drink per day, and men  should not have more than two drinks per day. One drink is equal to: ? 12 oz of beer. ? 5 oz of wine. ? 1 oz of hard liquor.  Do not drink on an empty stomach.  Keep yourself hydrated. Have water, diet soda, or unsweetened iced tea.  Regular soda, juice, and other mixers might contain a lot of carbohydrates and should be counted.  What foods are not recommended? As you make food choices, it is important to remember that all foods are not the same. Some foods have fewer nutrients per serving than other foods, even though they might have the same number of calories or carbohydrates. It is difficult to get your body what it needs when you eat foods with fewer nutrients. Examples of foods that you should avoid that are high in calories and carbohydrates but low in nutrients include:  Trans fats (most processed foods list trans fats on the Nutrition Facts label).  Regular soda.  Juice.  Candy.  Sweets, such as cake, pie, doughnuts, and cookies.  Fried foods.  What foods can I eat? Eat nutrient-rich foods, which will nourish your body and keep you healthy. The food you should eat also will depend on several factors, including:  The calories you need.  The medicines you take.  Your weight.  Your blood glucose level.  Your blood pressure level.  Your cholesterol level.  You should eat a variety of foods, including:  Protein. ? Lean cuts of meat. ? Proteins low in saturated fats, such as fish, egg whites, and beans. Avoid processed meats.  Fruits and vegetables. ? Fruits and vegetables that may help control blood glucose levels, such as apples,   mangoes, and yams.  Dairy products. ? Choose fat-free or low-fat dairy products, such as milk, yogurt, and cheese.  Grains, bread, pasta, and rice. ? Choose whole grain products, such as multigrain bread, whole oats, and brown rice. These foods may help control blood pressure.  Fats. ? Foods containing healthful fats, such as  nuts, avocado, olive oil, canola oil, and fish.  Does everyone with diabetes mellitus have the same meal plan? Because every person with diabetes mellitus is different, there is not one meal plan that works for everyone. It is very important that you meet with a dietitian who will help you create a meal plan that is just right for you. This information is not intended to replace advice given to you by your health care provider. Make sure you discuss any questions you have with your health care provider. Document Released: 01/31/2005 Document Revised: 10/12/2015 Document Reviewed: 04/02/2013 Elsevier Interactive Patient Education  2017 [employer].  

## 2016-09-02 NOTE — Progress Notes (Signed)
[phone] 

## 2016-09-02 NOTE — Telephone Encounter (Addendum)
The patient was in the clinic today for an appointment and was accompanied by his wife. He had a letter from [facility] noting that he has a $7700 deductible that needs to be met before he will qualify for ongoing medicaid.   This CM placed a call to the patient's DSS caseworker, Hilarie Fredrickson # [phone]. She stated that the patient is covered by medicaid through 09/16/16 and then would have to meet the deductible before qualifying for medicaid. She noted that with the patient' social security and the wife's income, the patient's income is double what is allowed. She noted that the income of both the patient and his wife need to be included. She said that she has spoken to the patient numerous times about this issue of the deductible.   This CM met with the patient and his wife and explained the results of the call with Ms Beatris Si. The patient stated that he receives $850/month in disability and his rent is $750.  His wife works and makes $8.00/hour and the hours worked /week vary.  They were very disappointed that he would be loosing his medicaid benefit  Explained that the [facility] can work with him and there are medication assistance programs available to insure that he receives his medications . Also instructed him to call the [facility] the last week on April to schedule an appointment with the Financial Counselor to apply for [employer] . This CM provided those instructions in writing. The patient stated that he may soon need to start hemodialysis.  This CM explained to them that if he is receiving HD he may qualify for medicaid and after receiving disability for 2 years, he may qualify for medicare. He as approved for disability in 2017. They were very appreciative of the information.

## 2016-09-02 NOTE — Progress Notes (Signed)
Subjective:  Patient ID: Russell Price, male    DOB: 10/25/1960  Age: 56 y.o. MRN: [id number]  CC: Follow-up (hypertension and diabetes)   HPI Arturo S Walle is a 56 year old male with a history of hypertension, type 2 diabetes mellitus (A1c 7.4), diabetic neuropathy, blind in the right eye, stage IV chronic kidney disease (status post left forearm AV fistula) who comes in today for a follow-up visit.  He is accompanied by his wife and endorses compliance with his medications. Blood sugar log reveals fluctuations in blood sugars with fasting between 65 and 180 and he has remained on 15 units of Lantus. He denies tremors, hyperglycemic symptoms.  He has an upcoming appointment with his nephrologist next month at which time he is supposed to commence dialysis.  He is accompanied by his wife who complains his Medicaid will be ending by the end of the month and presents left as her Medicaid indicating a high deductible of 7000+ dollars. We have no additional concerns today.  Past Medical History:  Diagnosis Date  . CAD (coronary artery disease)    a. underwent cath in 02/2016 after an abnormal nuc which showed 2V CAD in RCA and OM3 with no good targets for PCI. Medical managment recommended.   . Chronic kidney disease   . Diabetes mellitus without complication ([place])    followed by Dr Chalmers Cater  . ESRD (end stage renal disease) ([place])   . Glaucoma   . Hypertension    followed by [facility]  . Stroke [facility])     Past Surgical History:  Procedure Laterality Date  . AV FISTULA PLACEMENT Left 03/12/2016   Procedure: Left Arm ARTERIOVENOUS (AV) FISTULA CREATION;  Surgeon: Waynetta Sandy, MD;  Location: [place];  Service: Vascular;  Laterality: Left;  . BASCILIC VEIN TRANSPOSITION Left 05/14/2016   Procedure: SECOND STAGE LEFT BASILIC VEIN TRANSPOSITION;  Surgeon: Waynetta Sandy, MD;  Location: [place];  Service: Vascular;  Laterality: Left;  . BRONCHOSCOPY   02/14/2015   for pulm hemorrhage  . CARDIAC CATHETERIZATION N/A 03/04/2016   Procedure: Left Heart Cath and Coronary Angiography;  Surgeon: Leonie Man, MD;  Location: [place] CV LAB;  Service: Cardiovascular;  Laterality: N/A;  . EYE SURGERY Left     Allergies  Allergen Reactions  . No Known Allergies      Outpatient Medications Prior to Visit  Medication Sig Dispense Refill  . aspirin 81 MG chewable tablet Chew 1 tablet (81 mg total) by mouth daily.    Marland Kitchen b complex-vitamin c-folic acid (NEPHRO-VITE) 0.8 MG TABS tablet Take 1 tablet by mouth at bedtime.    Marland Kitchen glucose blood (ACCU-CHEK AVIVA PLUS) test strip Use 3 times daily before meals and at bedtime 100 each 12  . Lancet Devices (ACCU-CHEK SOFTCLIX) lancets Use as instructed 3 times daily before meals and at bedtime 1 each 12  . atorvastatin (LIPITOR) 40 MG tablet Take 1 tablet (40 mg total) by mouth daily at 6 PM. (Patient taking differently: Take 40 mg by mouth daily. ) 30 tablet 5  . carvedilol (COREG) 6.25 MG tablet Take 1 tablet (6.25 mg total) by mouth 2 (two) times daily with a meal. 60 tablet 6  . gabapentin (NEURONTIN) 300 MG capsule Take 300 mg by mouth daily.   5  . insulin aspart (NOVOLOG) 100 UNIT/ML injection Inject 2-10 Units into the skin 3 (three) times daily before meals.    . insulin glargine (LANTUS) 100 UNIT/ML injection Inject  0.1 mLs (10 Units total) into the skin daily.    . Blood Glucose Monitoring Suppl (ACCU-CHEK AVIVA) device Use as instructed 3 times daily before meals and at bedtime. (Patient not taking: Reported on 09/02/2016) 1 each 0  . PREDNISOLONE ACETATE OP Apply 1 drop to eye daily.     . chlorthalidone (HYGROTON) 25 MG tablet Take 25 mg by mouth daily.  5  . NIFEdipine (PROCARDIA XL/ADALAT-CC) 90 MG 24 hr tablet Take 90 mg by mouth daily.  5   No facility-administered medications prior to visit.     ROS Review of Systems  Constitutional: Negative for activity change and appetite change.    HENT: Negative for sinus pressure and sore throat.   Eyes: Positive for visual disturbance.  Respiratory: Negative for cough, chest tightness and shortness of breath.   Cardiovascular: Negative for chest pain and leg swelling.  Gastrointestinal: Negative for abdominal distention, abdominal pain, constipation and diarrhea.  Endocrine: Negative.   Genitourinary: Negative for dysuria.  Musculoskeletal: Negative for joint swelling and myalgias.  Skin: Negative for rash.  Allergic/Immunologic: Negative.   Neurological: Negative for weakness, light-headedness and numbness.  Psychiatric/Behavioral: Negative for dysphoric mood and suicidal ideas.    Objective:  BP (!) 148/82 (BP Location: Right Arm, Patient Position: Sitting, Cuff Size: Normal)   Pulse (!) 49   Temp 97.5 F (36.4 C) (Oral)   Ht 5' 10.08" (1.78 m)   Wt 193 lb (87.5 kg)   SpO2 99%   BMI 27.63 kg/m   BP/Weight 09/02/2016 06/14/2016 2/83/1517  Systolic BP 616 073 710  Diastolic BP 82 78 60  Wt. (Lbs) 193 185 -  BMI 27.63 25.8 -      Physical Exam  Constitutional: He is oriented to person, place, and time. He appears well-developed and well-nourished.  Eyes:  Blind in right eye  Cardiovascular: Normal rate, normal heart sounds and intact distal pulses.   No murmur heard. Pulmonary/Chest: Effort normal and breath sounds normal. He has no wheezes. He has no rales. He exhibits no tenderness.  Abdominal: Soft. Bowel sounds are normal. He exhibits no distension and no mass. There is no tenderness.  Musculoskeletal: Normal range of motion. He exhibits edema (1+ bilateral pitting pedal edema).  Left brachiocephalic AV fistula with palpable thrill   Neurological: He is alert and oriented to person, place, and time.  Skin: Skin is warm and dry.  Psychiatric: He has a normal mood and affect.    Lab Results  Component Value Date   HGBA1C 7.4 09/02/2016     Assessment & Plan:   1. DM (diabetes mellitus) with  complications ([place]) Controlled with A1c 7.4 Discussed symptoms of hypoglycemia No regimen change Podiatry appointment in 2 months - Glucose (CBG) - HgB A1c - insulin glargine (LANTUS) 100 UNIT/ML injection; Inject 0.15 mLs (15 Units total) into the skin at bedtime.  Dispense: 10 mL; Refill: 5 - insulin aspart (NOVOLOG) 100 UNIT/ML injection; Inject 2-10 Units into the skin 3 (three) times daily before meals.  Dispense: 10 mL; Refill: 5  2. Type 2 diabetes mellitus with stage 4 chronic kidney disease, with long-term current use of insulin (HCC) Scheduled to commence dialysis next month - furosemide (LASIX) 80 MG tablet; Take 80 mg by mouth 2 (two) times daily. - CMP14+EGFR  3. Pure hypercholesterolemia Low cholesterol diet - atorvastatin (LIPITOR) 40 MG tablet; Take 1 tablet (40 mg total) by mouth daily at 6 PM.  Dispense: 30 tablet; Refill: 5  4. Essential hypertension  Slightly elevated BP above target of less than 130/80 Regimen change as he is prone to hypotension during dialysis - NIFEdipine (PROCARDIA XL/ADALAT-CC) 90 MG 24 hr tablet; Take 1 tablet (90 mg total) by mouth daily.  Dispense: 30 tablet; Refill: 5 - carvedilol (COREG) 6.25 MG tablet; Take 1 tablet (6.25 mg total) by mouth 2 (two) times daily with a meal.  Dispense: 60 tablet; Refill: 6 - chlorthalidone (HYGROTON) 25 MG tablet; Take 1 tablet (25 mg total) by mouth daily.  Dispense: 30 tablet; Refill: 5  5. Diabetic mononeuropathy associated with diabetes mellitus due to underlying condition (HCC) Stable - gabapentin (NEURONTIN) 300 MG capsule; Take 1 capsule (300 mg total) by mouth daily.  Dispense: 30 capsule; Refill: 5   Meds ordered this encounter  Medications  . NIFEdipine (PROCARDIA XL/ADALAT-CC) 90 MG 24 hr tablet    Sig: Take 1 tablet (90 mg total) by mouth daily.    Dispense:  30 tablet    Refill:  5  . insulin glargine (LANTUS) 100 UNIT/ML injection    Sig: Inject 0.15 mLs (15 Units total) into the skin  at bedtime.    Dispense:  10 mL    Refill:  5  . atorvastatin (LIPITOR) 40 MG tablet    Sig: Take 1 tablet (40 mg total) by mouth daily at 6 PM.    Dispense:  30 tablet    Refill:  5  . carvedilol (COREG) 6.25 MG tablet    Sig: Take 1 tablet (6.25 mg total) by mouth 2 (two) times daily with a meal.    Dispense:  60 tablet    Refill:  6  . gabapentin (NEURONTIN) 300 MG capsule    Sig: Take 1 capsule (300 mg total) by mouth daily.    Dispense:  30 capsule    Refill:  5  . chlorthalidone (HYGROTON) 25 MG tablet    Sig: Take 1 tablet (25 mg total) by mouth daily.    Dispense:  30 tablet    Refill:  5  . insulin aspart (NOVOLOG) 100 UNIT/ML injection    Sig: Inject 2-10 Units into the skin 3 (three) times daily before meals.    Dispense:  10 mL    Refill:  5    As per sliding scale    Follow-up: Return in about 3 months (around 12/02/2016) for Follow-up on chronic medical conditions.   Arnoldo Morale MD

## 2016-09-03 ENCOUNTER — Telehealth: Payer: Self-pay | Admitting: *Deleted

## 2016-09-03 LAB — CMP14+EGFR
A/G RATIO: 1.2 (ref 1.2–2.2)
ALT: 52 IU/L — AB (ref 0–44)
AST: 28 IU/L (ref 0–40)
Albumin: 3.5 g/dL (ref 3.5–5.5)
Alkaline Phosphatase: 255 IU/L — ABNORMAL HIGH (ref 39–117)
BUN/Creatinine Ratio: 20 (ref 9–20)
BUN: 111 mg/dL (ref 6–24)
Bilirubin Total: 0.5 mg/dL (ref 0.0–1.2)
CALCIUM: 8.7 mg/dL (ref 8.7–10.2)
CO2: 19 mmol/L (ref 18–29)
Chloride: 92 mmol/L — ABNORMAL LOW (ref 96–106)
Creatinine, Ser: 5.43 mg/dL — ABNORMAL HIGH (ref 0.76–1.27)
GFR calc Af Amer: 12 mL/min/{1.73_m2} — ABNORMAL LOW (ref >59)
GFR calc non Af Amer: 11 mL/min/{1.73_m2} — ABNORMAL LOW (ref >59)
GLUCOSE: 99 mg/dL (ref 65–99)
Globulin, Total: 3 g/dL (ref 1.5–4.5)
POTASSIUM: 5.1 mmol/L (ref 3.5–5.2)
Sodium: 132 mmol/L — ABNORMAL LOW (ref 134–144)
TOTAL PROTEIN: 6.5 g/dL (ref 6.0–8.5)

## 2016-09-03 NOTE — Telephone Encounter (Signed)
Patient verified DOB Patient is aware of labs being abnormal showing a further decline in kidney function and he needs to schedule an appointment with the nephrologist. Patient states he has an appointment on 09/25/16. MA advised patient to call the office and request a sooner appointment if available. Patient expressed his understanding and had no further questions.

## 2016-09-03 NOTE — Telephone Encounter (Signed)
-----   Message from Arnoldo Morale, MD sent at 09/03/2016  1:25 PM EDT ----- Labs are abnormal; this is in keeping with his chronic kidney disease and point towards the need for dialysis. Please advise him to schedule with his nephrologist as soon as possible.

## 2016-09-09 MED FILL — ?FUROSEMIDE 80MG TABLET: 80 | 30 days supply | Qty: 60 | Fill #1

## 2016-09-12 ENCOUNTER — Encounter: Payer: Self-pay | Admitting: Cardiovascular Disease

## 2016-09-12 ENCOUNTER — Ambulatory Visit (INDEPENDENT_AMBULATORY_CARE_PROVIDER_SITE_OTHER): Payer: Medicaid Other | Admitting: Cardiovascular Disease

## 2016-09-12 VITALS — BP 160/76 | HR 60 | Ht 70.0 in | Wt 174.0 lb

## 2016-09-12 DIAGNOSIS — E118 Type 2 diabetes mellitus with unspecified complications: Secondary | ICD-10-CM | POA: Diagnosis not present

## 2016-09-12 DIAGNOSIS — I2510 Atherosclerotic heart disease of native coronary artery without angina pectoris: Secondary | ICD-10-CM | POA: Diagnosis not present

## 2016-09-12 DIAGNOSIS — E7800 Pure hypercholesterolemia, unspecified: Secondary | ICD-10-CM

## 2016-09-12 DIAGNOSIS — I10 Essential (primary) hypertension: Secondary | ICD-10-CM

## 2016-09-12 NOTE — Patient Instructions (Signed)
Dr Croitoru recommends that you schedule a follow-up appointment in 12 months. You will receive a reminder letter in the mail two months in advance. If you don't receive a letter, please call our office to schedule the follow-up appointment.  If you need a refill on your cardiac medications before your next appointment, please call your pharmacy. 

## 2016-09-12 NOTE — Progress Notes (Signed)
Cardiology Office Note:    Date:  09/12/2016   ID:  TORE CARREKER, DOB [birth year], MRN [id number]  PCP:  Arnoldo Morale, MD  Cardiologist:  Sanda Klein, MD     Referring MD: Arnoldo Morale, MD   Chief Complaint  Patient presents with  . Follow-up    History of Present Illness:    Russell Price is a 56 y.o. male with a hx of Coronary artery disease (stenoses in small caliber RCA and OM 3), diabetes mellitus requiring insulin, hypertension, CK D stage V approaching hemodialysis, mature AV fistula left arm.  Since his last evaluation he denies problems with chest discomfort either at rest or with activity. For the most part his blood pressure is well controlled, although it is a little volatile. He follows with Dr. Vanetta Mulders in nephrology and his fistula was placed by Dr. Donzetta Matters.  He is relatively sedentary, but is able to climb a flight of stairs and perform light household activities without dyspnea or any chest discomfort. Denies palpitations, syncope, claudication, focal neurological events, leg edema. He still has pretty normal urine output.  His blood pressure was mildly elevated when he initially checked in and was still 150/70 by the end of our interview. He has not smoked since the 1980s. His most recent lipid profile shows an excellent LDL of only 44 and a borderline HDL of 38. The most recent hemoglobin A1c was slightly higher than desirable at 7.4%. His creatinine was 5.43 a couple of weeks ago and his potassium was borderline high at 5.1.  Diagnostic Diagram 03/04/2016   Mid RCA lesion, 65 %stenosed. Dist RCA lesion, 85 %stenosed. Relatively small caliber vessel. Smaller diameter than 5 French catheter  Prox LAD lesion, 45 %stenosed. Mid LAD lesion, 35 %stenosed.  3rd Mrg lesion, 70 %stenosed.  LV end diastolic pressure is moderately elevated.    Patient does have severe 2 vessel disease involving the RCA and OM 3. Both vessels are relatively small in  caliber and not good PCI targets       Past Medical History:  Diagnosis Date  . CAD (coronary artery disease)    a. underwent cath in 02/2016 after an abnormal nuc which showed 2V CAD in RCA and OM3 with no good targets for PCI. Medical managment recommended.   . Chronic kidney disease   . Diabetes mellitus without complication ([place])    followed by Dr Chalmers Cater  . ESRD (end stage renal disease) ([place])   . Glaucoma   . Hypertension    followed by [facility]  . Stroke [facility])     Past Surgical History:  Procedure Laterality Date  . AV FISTULA PLACEMENT Left 03/12/2016   Procedure: Left Arm ARTERIOVENOUS (AV) FISTULA CREATION;  Surgeon: Waynetta Sandy, MD;  Location: [facility];  Service: Vascular;  Laterality: Left;  . BASCILIC VEIN TRANSPOSITION Left 05/14/2016   Procedure: SECOND STAGE LEFT BASILIC VEIN TRANSPOSITION;  Surgeon: Waynetta Sandy, MD;  Location: [place];  Service: Vascular;  Laterality: Left;  . BRONCHOSCOPY  02/14/2015   for pulm hemorrhage  . CARDIAC CATHETERIZATION N/A 03/04/2016   Procedure: Left Heart Cath and Coronary Angiography;  Surgeon: Leonie Man, MD;  Location: [place] [facility];  Service: Cardiovascular;  Laterality: N/A;  . EYE SURGERY Left     Current Medications: Current Meds  Medication Sig  . aspirin 81 MG chewable tablet Chew 1 tablet (81 mg total) by mouth daily.  Marland Kitchen atorvastatin (LIPITOR) 40 MG tablet  Take 1 tablet (40 mg total) by mouth daily at 6 PM.  . b complex-vitamin c-folic acid (NEPHRO-VITE) 0.8 MG TABS tablet Take 1 tablet by mouth at bedtime.  . Blood Glucose Monitoring Suppl (ACCU-CHEK AVIVA) device Use as instructed 3 times daily before meals and at bedtime.  . carvedilol (COREG) 6.25 MG tablet Take 1 tablet (6.25 mg total) by mouth 2 (two) times daily with a meal.  . chlorthalidone (HYGROTON) 25 MG tablet Take 1 tablet (25 mg total) by mouth daily.  . furosemide (LASIX) 80 MG tablet Take 80 mg by mouth  2 (two) times daily.  Marland Kitchen gabapentin (NEURONTIN) 300 MG capsule Take 1 capsule (300 mg total) by mouth daily.  Marland Kitchen glucose blood (ACCU-CHEK AVIVA PLUS) test strip Use 3 times daily before meals and at bedtime  . insulin aspart (NOVOLOG) 100 UNIT/ML injection Inject 2-10 Units into the skin 3 (three) times daily before meals.  . insulin glargine (LANTUS) 100 UNIT/ML injection Inject 0.15 mLs (15 Units total) into the skin at bedtime.  Elmore Guise Devices (ACCU-CHEK SOFTCLIX) lancets Use as instructed 3 times daily before meals and at bedtime  . NIFEdipine (PROCARDIA XL/ADALAT-CC) 90 MG 24 hr tablet Take 1 tablet (90 mg total) by mouth daily.  Marland Kitchen PREDNISOLONE ACETATE OP Apply 1 drop to eye daily.      Allergies:   No known allergies   Social History   Social History  . Marital status: Single    Spouse name: N/A  . Number of children: N/A  . Years of education: N/A   Social History Main Topics  . Smoking status: Former Smoker    Packs/day: 0.00    Years: 4.00    Quit date: 02/23/1983  . Smokeless tobacco: Never Used  . Alcohol use No  . Drug use: No  . Sexual activity: Not Asked   Other Topics Concern  . None   Social History Narrative  . None     family history includes Diabetes in his mother. ROS:   Please see the history of present illness.     All other systems reviewed and are negative.   EKGs/Labs/Other Studies Reviewed:    EKG:  EKG is  ordered today.  The ekg ordered today demonstrates Normal sinus rhythm, T-wave inversion in leads V5-V6, II, I, aVL. QTC 446 ms  Recent Labs: 03/03/2016: Platelets 125 05/14/2016: Hemoglobin 10.2 09/02/2016: ALT 52; BUN 111; Creatinine, Ser 5.43; Potassium 5.1; Sodium 132   Recent Lipid Panel    Component Value Date/Time   CHOL 92 (L) 10/09/2015 0951   TRIG 49 10/09/2015 0951   HDL 38 (L) 10/09/2015 0951   CHOLHDL 2.4 10/09/2015 0951   VLDL 10 10/09/2015 0951   LDLCALC 44 10/09/2015 0951    Physical Exam:    VS:  BP (!)  160/76   Pulse 60   Ht 5\' 10"  (1.778 m)   Wt 78.9 kg (174 lb)   BMI 24.97 kg/m     Wt Readings from Last 3 Encounters:  09/12/16 78.9 kg (174 lb)  09/02/16 87.5 kg (193 lb)  06/14/16 83.9 kg (185 lb)     GEN:  Well nourished, well developed in no acute distress HEENT: Normal NECK: No JVD; No carotid bruits LYMPHATICS: No lymphadenopathy CARDIAC: RRR, no murmurs, rubs, gallops. There is a well developed left arm AV fistula with good thrill and bruit RESPIRATORY:  Clear to auscultation without rales, wheezing or rhonchi  ABDOMEN: Soft, non-tender, non-distended MUSCULOSKELETAL:  No edema;  No deformity  SKIN: Warm and dry NEUROLOGIC:  Alert and oriented x 3 PSYCHIATRIC:  Normal affect   ASSESSMENT:    1. Coronary artery disease involving native coronary artery of native heart without angina pectoris   2. Essential hypertension   3. DM (diabetes mellitus) with complications ([place])   4. Pure hypercholesterolemia    PLAN:    In order of problems listed above:  1. CAD: He has multivessel CAD, but the vessels with severe stenoses are small and poor targets for percutaneous intervention. She does not have angina pectoris. The focus is on risk factor modification. Continue aspirin and statin and beta blocker. 2. HTN: Imperfect blood pressure control, but his blood pressure has been pretty volatile. We'll try to get the records from Dr. Shelva Majestic office before making any adjustments in his medication 3. DM: Insulin requiring with borderline control. Target A1c under 7%. 4. HLP: LDL well within target range. No change in statin recommended.   Medication Adjustments/Labs and Tests Ordered: Current medicines are reviewed at length with the patient today.  Concerns regarding medicines are outlined above. Labs and tests ordered and medication changes are outlined in the patient instructions below:  Patient Instructions  Dr Sallyanne Kuster recommends that you schedule a follow-up appointment  in 12 months. You will receive a reminder letter in the mail two months in advance. If you don't receive a letter, please call our office to schedule the follow-up appointment.  If you need a refill on your cardiac medications before your next appointment, please call your pharmacy.    Signed, Sanda Klein, MD  09/12/2016 5:46 PM    [place]

## 2016-09-18 ENCOUNTER — Other Ambulatory Visit (HOSPITAL_COMMUNITY): Payer: Self-pay | Admitting: *Deleted

## 2016-09-18 MED FILL — CARVEDILOL 12.5 MG TABLET: 12.5 | 30 days supply | Qty: 60 | Fill #4

## 2016-09-18 MED FILL — RENA-VITE TABLET: 30 days supply | Qty: 30 | Fill #2

## 2016-09-19 ENCOUNTER — Encounter (HOSPITAL_COMMUNITY)
Admission: RE | Admit: 2016-09-19 | Discharge: 2016-09-19 | Disposition: A | Discharge status: Home / Self Care | Payer: Medicaid Other | Source: Ambulatory Visit | Attending: Nephrology | Admitting: Nephrology

## 2016-09-19 DIAGNOSIS — D631 Anemia in chronic kidney disease: Secondary | ICD-10-CM | POA: Insufficient documentation

## 2016-09-19 MED ORDER — FERUMOXYTOL INJECTION 510 MG/17 ML
510.0000 mg | INTRAVENOUS | Status: DC
  Administered 2016-09-19: 510 mg via INTRAVENOUS
  Filled 2016-09-19: qty 17

## 2016-09-25 ENCOUNTER — Other Ambulatory Visit (HOSPITAL_COMMUNITY): Payer: Self-pay | Admitting: *Deleted

## 2016-09-25 MED FILL — ATORVASTATIN 40 MG TABLET: 40 | 30 days supply | Qty: 30 | Fill #2

## 2016-09-25 MED FILL — TRUE METRIX TEST STRIP: 30 days supply | Qty: 100 | Fill #3

## 2016-09-26 ENCOUNTER — Encounter (HOSPITAL_COMMUNITY)
Admission: RE | Admit: 2016-09-26 | Discharge: 2016-09-26 | Disposition: A | Discharge status: Home / Self Care | Payer: Medicaid Other | Source: Ambulatory Visit | Attending: Nephrology | Admitting: Nephrology

## 2016-09-26 DIAGNOSIS — N189 Chronic kidney disease, unspecified: Secondary | ICD-10-CM | POA: Diagnosis not present

## 2016-09-26 DIAGNOSIS — D631 Anemia in chronic kidney disease: Secondary | ICD-10-CM | POA: Diagnosis not present

## 2016-09-26 MED ORDER — SODIUM CHLORIDE 0.9 % IV SOLN
510.0000 mg | INTRAVENOUS | Status: DC
  Administered 2016-09-26: 08:00:00 510 mg via INTRAVENOUS
  Filled 2016-09-26: qty 17

## 2016-09-26 MED FILL — CALCITRIOL 0.25 MCG CAPSULE: 0.25 | 30 days supply | Qty: 30 | Fill #0

## 2016-10-07 MED FILL — ?FUROSEMIDE 80MG TABLET: 80 | 30 days supply | Qty: 60 | Fill #2

## 2016-10-07 MED FILL — GABAPENTIN 300 MG CAPSULE: 300 | 30 days supply | Qty: 30 | Fill #1

## 2016-10-21 MED FILL — RENA-VITE TABLET: 30 days supply | Qty: 30 | Fill #3

## 2016-10-21 MED FILL — CARVEDILOL 12.5 MG TABLET: 12.5 | 30 days supply | Qty: 60 | Fill #5

## 2016-10-23 MED FILL — TRUE METRIX TEST STRIP: 30 days supply | Qty: 100 | Fill #4

## 2016-10-23 MED FILL — ATORVASTATIN 40 MG TABLET: 40 | 30 days supply | Qty: 30 | Fill #3

## 2016-10-30 MED FILL — CHLORTHALIDONE 25 MG TABLET: 25 | 30 days supply | Qty: 30 | Fill #1

## 2016-11-04 MED FILL — TRUE METRIX TEST STRIP: 30 days supply | Qty: 100 | Fill #5

## 2016-11-04 MED FILL — CARVEDILOL 12.5 MG TABLET: 12.5 | 30 days supply | Qty: 60 | Fill #6

## 2016-11-04 MED FILL — !NOVOLOG 100UNITS/ML VIAL: 100/ML | 32 days supply | Qty: 10 | Fill #1

## 2016-11-04 MED FILL — GABAPENTIN 300 MG CAPSULE: 300 | 30 days supply | Qty: 30 | Fill #2

## 2016-11-04 MED FILL — CALCITRIOL 0.25 MCG CAPSULE: 0.25 | 30 days supply | Qty: 30 | Fill #1

## 2016-11-04 MED FILL — LANTUS 100 UNITS/ML VIAL: 100 | 28 days supply | Qty: 10 | Fill #1

## 2016-11-04 MED FILL — ?FUROSEMIDE 80MG TABLET: 80 | 30 days supply | Qty: 60 | Fill #3

## 2016-11-04 MED FILL — RENA-VITE TABLET: 30 days supply | Qty: 30 | Fill #4

## 2016-11-05 ENCOUNTER — Encounter (HOSPITAL_COMMUNITY): Payer: Self-pay | Admitting: Emergency Medicine

## 2016-11-05 DIAGNOSIS — R509 Fever, unspecified: Secondary | ICD-10-CM | POA: Diagnosis not present

## 2016-11-05 DIAGNOSIS — E1121 Type 2 diabetes mellitus with diabetic nephropathy: Secondary | ICD-10-CM | POA: Diagnosis present

## 2016-11-05 DIAGNOSIS — Z79899 Other long term (current) drug therapy: Secondary | ICD-10-CM

## 2016-11-05 DIAGNOSIS — Z8673 Personal history of transient ischemic attack (TIA), and cerebral infarction without residual deficits: Secondary | ICD-10-CM

## 2016-11-05 DIAGNOSIS — D696 Thrombocytopenia, unspecified: Secondary | ICD-10-CM | POA: Diagnosis present

## 2016-11-05 DIAGNOSIS — R34 Anuria and oliguria: Secondary | ICD-10-CM | POA: Diagnosis present

## 2016-11-05 DIAGNOSIS — E785 Hyperlipidemia, unspecified: Secondary | ICD-10-CM | POA: Diagnosis present

## 2016-11-05 DIAGNOSIS — N179 Acute kidney failure, unspecified: Secondary | ICD-10-CM | POA: Diagnosis present

## 2016-11-05 DIAGNOSIS — I5032 Chronic diastolic (congestive) heart failure: Secondary | ICD-10-CM | POA: Diagnosis present

## 2016-11-05 DIAGNOSIS — Z992 Dependence on renal dialysis: Secondary | ICD-10-CM

## 2016-11-05 DIAGNOSIS — D638 Anemia in other chronic diseases classified elsewhere: Secondary | ICD-10-CM | POA: Diagnosis present

## 2016-11-05 DIAGNOSIS — H409 Unspecified glaucoma: Secondary | ICD-10-CM | POA: Diagnosis present

## 2016-11-05 DIAGNOSIS — N2581 Secondary hyperparathyroidism of renal origin: Secondary | ICD-10-CM | POA: Diagnosis present

## 2016-11-05 DIAGNOSIS — E875 Hyperkalemia: Secondary | ICD-10-CM | POA: Diagnosis present

## 2016-11-05 DIAGNOSIS — K649 Unspecified hemorrhoids: Secondary | ICD-10-CM | POA: Diagnosis present

## 2016-11-05 DIAGNOSIS — E1140 Type 2 diabetes mellitus with diabetic neuropathy, unspecified: Secondary | ICD-10-CM | POA: Diagnosis present

## 2016-11-05 DIAGNOSIS — E8770 Fluid overload, unspecified: Principal | ICD-10-CM | POA: Diagnosis present

## 2016-11-05 DIAGNOSIS — E871 Hypo-osmolality and hyponatremia: Secondary | ICD-10-CM | POA: Diagnosis present

## 2016-11-05 DIAGNOSIS — H5461 Unqualified visual loss, right eye, normal vision left eye: Secondary | ICD-10-CM | POA: Diagnosis present

## 2016-11-05 DIAGNOSIS — E1122 Type 2 diabetes mellitus with diabetic chronic kidney disease: Secondary | ICD-10-CM | POA: Diagnosis present

## 2016-11-05 DIAGNOSIS — R188 Other ascites: Secondary | ICD-10-CM | POA: Diagnosis not present

## 2016-11-05 DIAGNOSIS — Z794 Long term (current) use of insulin: Secondary | ICD-10-CM

## 2016-11-05 DIAGNOSIS — E440 Moderate protein-calorie malnutrition: Secondary | ICD-10-CM | POA: Diagnosis present

## 2016-11-05 DIAGNOSIS — N186 End stage renal disease: Secondary | ICD-10-CM | POA: Diagnosis present

## 2016-11-05 DIAGNOSIS — Z87891 Personal history of nicotine dependence: Secondary | ICD-10-CM

## 2016-11-05 DIAGNOSIS — I2510 Atherosclerotic heart disease of native coronary artery without angina pectoris: Secondary | ICD-10-CM | POA: Diagnosis present

## 2016-11-05 DIAGNOSIS — I132 Hypertensive heart and chronic kidney disease with heart failure and with stage 5 chronic kidney disease, or end stage renal disease: Secondary | ICD-10-CM | POA: Diagnosis present

## 2016-11-05 DIAGNOSIS — D509 Iron deficiency anemia, unspecified: Secondary | ICD-10-CM | POA: Diagnosis present

## 2016-11-05 DIAGNOSIS — Z833 Family history of diabetes mellitus: Secondary | ICD-10-CM

## 2016-11-05 DIAGNOSIS — Z7982 Long term (current) use of aspirin: Secondary | ICD-10-CM

## 2016-11-05 DIAGNOSIS — K645 Perianal venous thrombosis: Secondary | ICD-10-CM | POA: Diagnosis present

## 2016-11-05 DIAGNOSIS — N2589 Other disorders resulting from impaired renal tubular function: Secondary | ICD-10-CM | POA: Diagnosis present

## 2016-11-05 LAB — COMPREHENSIVE METABOLIC PANEL
ALBUMIN: 2.6 g/dL — AB (ref 3.5–5.0)
ALT: 55 U/L (ref 17–63)
ANION GAP: 16 — AB (ref 5–15)
AST: 23 U/L (ref 15–41)
Alkaline Phosphatase: 401 U/L — ABNORMAL HIGH (ref 38–126)
BILIRUBIN TOTAL: 1 mg/dL (ref 0.3–1.2)
BUN: 130 mg/dL — AB (ref 6–20)
CO2: 13 mmol/L — ABNORMAL LOW (ref 22–32)
Calcium: 7.3 mg/dL — ABNORMAL LOW (ref 8.9–10.3)
Chloride: 86 mmol/L — ABNORMAL LOW (ref 101–111)
Creatinine, Ser: 6.85 mg/dL — ABNORMAL HIGH (ref 0.61–1.24)
GFR calc Af Amer: 9 mL/min — ABNORMAL LOW (ref >60)
GFR calc non Af Amer: 8 mL/min — ABNORMAL LOW (ref >60)
GLUCOSE: 132 mg/dL — AB (ref 65–99)
POTASSIUM: 5.2 mmol/L — AB (ref 3.5–5.1)
SODIUM: 115 mmol/L — AB (ref 135–145)
TOTAL PROTEIN: 6.1 g/dL — AB (ref 6.5–8.1)

## 2016-11-05 LAB — CBC WITH DIFFERENTIAL/PLATELET
BASOS ABS: 0 10*3/uL (ref 0.0–0.1)
Basophils Relative: 1 %
EOS PCT: 7 %
Eosinophils Absolute: 0.3 10*3/uL (ref 0.0–0.7)
HEMATOCRIT: 24.1 % — AB (ref 39.0–52.0)
Hemoglobin: 8.3 g/dL — ABNORMAL LOW (ref 13.0–17.0)
LYMPHS ABS: 0.8 10*3/uL (ref 0.7–4.0)
LYMPHS PCT: 19 %
MCH: 27.6 pg (ref 26.0–34.0)
MCHC: 34.4 g/dL (ref 30.0–36.0)
MCV: 80.1 fL (ref 78.0–100.0)
MONO ABS: 0.2 10*3/uL (ref 0.1–1.0)
Monocytes Relative: 6 %
NEUTROS ABS: 2.7 10*3/uL (ref 1.7–7.7)
Neutrophils Relative %: 67 %
PLATELETS: 171 10*3/uL (ref 150–400)
RBC: 3.01 MIL/uL — ABNORMAL LOW (ref 4.22–5.81)
RDW: 13.2 % (ref 11.5–15.5)
WBC: 4 10*3/uL (ref 4.0–10.5)

## 2016-11-05 NOTE — ED Triage Notes (Signed)
Patient was sent to ED by [facility] due to low sodium.  Patient had blood work done earlier today.  Patient is a dialysis patient, due to start dialysis next month.

## 2016-11-06 ENCOUNTER — Inpatient Hospital Stay (HOSPITAL_COMMUNITY)
Admission: EM | Admit: 2016-11-06 | Discharge: 2016-11-16 | DRG: 640 | Disposition: A | Discharge status: Home / Self Care | Payer: Medicaid Other | Attending: Internal Medicine | Admitting: Internal Medicine

## 2016-11-06 ENCOUNTER — Inpatient Hospital Stay (HOSPITAL_COMMUNITY): Payer: Medicaid Other

## 2016-11-06 ENCOUNTER — Encounter: Payer: Self-pay | Admitting: Pharmacist

## 2016-11-06 DIAGNOSIS — R0602 Shortness of breath: Secondary | ICD-10-CM

## 2016-11-06 DIAGNOSIS — D509 Iron deficiency anemia, unspecified: Secondary | ICD-10-CM | POA: Diagnosis present

## 2016-11-06 DIAGNOSIS — E871 Hypo-osmolality and hyponatremia: Secondary | ICD-10-CM | POA: Diagnosis present

## 2016-11-06 DIAGNOSIS — Z992 Dependence on renal dialysis: Secondary | ICD-10-CM

## 2016-11-06 DIAGNOSIS — I132 Hypertensive heart and chronic kidney disease with heart failure and with stage 5 chronic kidney disease, or end stage renal disease: Secondary | ICD-10-CM | POA: Diagnosis present

## 2016-11-06 DIAGNOSIS — E8770 Fluid overload, unspecified: Principal | ICD-10-CM | POA: Diagnosis present

## 2016-11-06 DIAGNOSIS — H5461 Unqualified visual loss, right eye, normal vision left eye: Secondary | ICD-10-CM | POA: Diagnosis present

## 2016-11-06 DIAGNOSIS — E785 Hyperlipidemia, unspecified: Secondary | ICD-10-CM | POA: Diagnosis present

## 2016-11-06 DIAGNOSIS — Z419 Encounter for procedure for purposes other than remedying health state, unspecified: Secondary | ICD-10-CM

## 2016-11-06 DIAGNOSIS — N186 End stage renal disease: Secondary | ICD-10-CM | POA: Diagnosis present

## 2016-11-06 DIAGNOSIS — E440 Moderate protein-calorie malnutrition: Secondary | ICD-10-CM | POA: Diagnosis present

## 2016-11-06 DIAGNOSIS — I2510 Atherosclerotic heart disease of native coronary artery without angina pectoris: Secondary | ICD-10-CM | POA: Diagnosis present

## 2016-11-06 DIAGNOSIS — E1140 Type 2 diabetes mellitus with diabetic neuropathy, unspecified: Secondary | ICD-10-CM | POA: Diagnosis present

## 2016-11-06 DIAGNOSIS — R509 Fever, unspecified: Secondary | ICD-10-CM

## 2016-11-06 DIAGNOSIS — K645 Perianal venous thrombosis: Secondary | ICD-10-CM | POA: Diagnosis present

## 2016-11-06 DIAGNOSIS — R188 Other ascites: Secondary | ICD-10-CM | POA: Diagnosis not present

## 2016-11-06 DIAGNOSIS — E118 Type 2 diabetes mellitus with unspecified complications: Secondary | ICD-10-CM | POA: Diagnosis present

## 2016-11-06 DIAGNOSIS — D696 Thrombocytopenia, unspecified: Secondary | ICD-10-CM | POA: Diagnosis present

## 2016-11-06 DIAGNOSIS — N2581 Secondary hyperparathyroidism of renal origin: Secondary | ICD-10-CM | POA: Diagnosis present

## 2016-11-06 DIAGNOSIS — E1122 Type 2 diabetes mellitus with diabetic chronic kidney disease: Secondary | ICD-10-CM | POA: Diagnosis present

## 2016-11-06 DIAGNOSIS — R34 Anuria and oliguria: Secondary | ICD-10-CM | POA: Diagnosis present

## 2016-11-06 DIAGNOSIS — D638 Anemia in other chronic diseases classified elsewhere: Secondary | ICD-10-CM | POA: Diagnosis present

## 2016-11-06 DIAGNOSIS — T82898A Other specified complication of vascular prosthetic devices, implants and grafts, initial encounter: Secondary | ICD-10-CM | POA: Diagnosis not present

## 2016-11-06 DIAGNOSIS — Z95828 Presence of other vascular implants and grafts: Secondary | ICD-10-CM

## 2016-11-06 DIAGNOSIS — E1121 Type 2 diabetes mellitus with diabetic nephropathy: Secondary | ICD-10-CM | POA: Diagnosis present

## 2016-11-06 DIAGNOSIS — N179 Acute kidney failure, unspecified: Secondary | ICD-10-CM | POA: Diagnosis present

## 2016-11-06 DIAGNOSIS — N2589 Other disorders resulting from impaired renal tubular function: Secondary | ICD-10-CM | POA: Diagnosis present

## 2016-11-06 DIAGNOSIS — E875 Hyperkalemia: Secondary | ICD-10-CM | POA: Diagnosis present

## 2016-11-06 DIAGNOSIS — I10 Essential (primary) hypertension: Secondary | ICD-10-CM | POA: Diagnosis not present

## 2016-11-06 DIAGNOSIS — I5032 Chronic diastolic (congestive) heart failure: Secondary | ICD-10-CM | POA: Diagnosis present

## 2016-11-06 DIAGNOSIS — R609 Edema, unspecified: Secondary | ICD-10-CM

## 2016-11-06 LAB — URINALYSIS, ROUTINE W REFLEX MICROSCOPIC
Bacteria, UA: NONE SEEN
Bilirubin Urine: NEGATIVE
Glucose, UA: NEGATIVE mg/dL
Ketones, ur: NEGATIVE mg/dL
Leukocytes, UA: NEGATIVE
Nitrite: NEGATIVE
Protein, ur: 30 mg/dL — AB
SPECIFIC GRAVITY, URINE: 1.006 (ref 1.005–1.030)
Squamous Epithelial / HPF: NONE SEEN
pH: 5 (ref 5.0–8.0)

## 2016-11-06 LAB — CBC
HEMATOCRIT: 24 % — AB (ref 39.0–52.0)
Hemoglobin: 8.4 g/dL — ABNORMAL LOW (ref 13.0–17.0)
MCH: 27.5 pg (ref 26.0–34.0)
MCHC: 35 g/dL (ref 30.0–36.0)
MCV: 78.4 fL (ref 78.0–100.0)
Platelets: 146 10*3/uL — ABNORMAL LOW (ref 150–400)
RBC: 3.06 MIL/uL — ABNORMAL LOW (ref 4.22–5.81)
RDW: 12.7 % (ref 11.5–15.5)
WBC: 3.7 10*3/uL — ABNORMAL LOW (ref 4.0–10.5)

## 2016-11-06 LAB — OSMOLALITY: OSMOLALITY: 285 mosm/kg (ref 275–295)

## 2016-11-06 LAB — RENAL FUNCTION PANEL
ALBUMIN: 2.3 g/dL — AB (ref 3.5–5.0)
ALBUMIN: 2.3 g/dL — AB (ref 3.5–5.0)
ANION GAP: 11 (ref 5–15)
Albumin: 2.5 g/dL — ABNORMAL LOW (ref 3.5–5.0)
Anion gap: 14 (ref 5–15)
Anion gap: 12 (ref 5–15)
BUN: 132 mg/dL — ABNORMAL HIGH (ref 6–20)
BUN: 129 mg/dL — ABNORMAL HIGH (ref 6–20)
BUN: 127 mg/dL — AB (ref 6–20)
CALCIUM: 7.2 mg/dL — AB (ref 8.9–10.3)
CHLORIDE: 87 mmol/L — AB (ref 101–111)
CO2: 15 mmol/L — ABNORMAL LOW (ref 22–32)
CO2: 19 mmol/L — ABNORMAL LOW (ref 22–32)
CO2: 16 mmol/L — ABNORMAL LOW (ref 22–32)
CREATININE: 6.22 mg/dL — AB (ref 0.61–1.24)
Calcium: 7.2 mg/dL — ABNORMAL LOW (ref 8.9–10.3)
Calcium: 7.1 mg/dL — ABNORMAL LOW (ref 8.9–10.3)
Chloride: 86 mmol/L — ABNORMAL LOW (ref 101–111)
Chloride: 87 mmol/L — ABNORMAL LOW (ref 101–111)
Creatinine, Ser: 6.64 mg/dL — ABNORMAL HIGH (ref 0.61–1.24)
Creatinine, Ser: 6.31 mg/dL — ABNORMAL HIGH (ref 0.61–1.24)
GFR calc Af Amer: 10 mL/min — ABNORMAL LOW (ref >60)
GFR calc non Af Amer: 8 mL/min — ABNORMAL LOW (ref >60)
GFR calc non Af Amer: 9 mL/min — ABNORMAL LOW (ref >60)
GFR, EST AFRICAN AMERICAN: 10 mL/min — AB (ref >60)
GFR, EST AFRICAN AMERICAN: 10 mL/min — AB (ref >60)
GFR, EST NON AFRICAN AMERICAN: 9 mL/min — AB (ref >60)
Glucose, Bld: 134 mg/dL — ABNORMAL HIGH (ref 65–99)
Glucose, Bld: 150 mg/dL — ABNORMAL HIGH (ref 65–99)
Glucose, Bld: 153 mg/dL — ABNORMAL HIGH (ref 65–99)
PHOSPHORUS: 7 mg/dL — AB (ref 2.5–4.6)
PHOSPHORUS: 7 mg/dL — AB (ref 2.5–4.6)
POTASSIUM: 4.8 mmol/L (ref 3.5–5.1)
Phosphorus: 7.1 mg/dL — ABNORMAL HIGH (ref 2.5–4.6)
Potassium: 4.5 mmol/L (ref 3.5–5.1)
Potassium: 4.5 mmol/L (ref 3.5–5.1)
SODIUM: 116 mmol/L — AB (ref 135–145)
Sodium: 116 mmol/L — CL (ref 135–145)
Sodium: 115 mmol/L — CL (ref 135–145)

## 2016-11-06 LAB — GLUCOSE, CAPILLARY
GLUCOSE-CAPILLARY: 133 mg/dL — AB (ref 65–99)
GLUCOSE-CAPILLARY: 154 mg/dL — AB (ref 65–99)
GLUCOSE-CAPILLARY: 201 mg/dL — AB (ref 65–99)
GLUCOSE-CAPILLARY: 166 mg/dL — AB (ref 65–99)
Glucose-Capillary: 120 mg/dL — ABNORMAL HIGH (ref 65–99)

## 2016-11-06 LAB — BASIC METABOLIC PANEL
ANION GAP: 11 (ref 5–15)
BUN: 129 mg/dL — ABNORMAL HIGH (ref 6–20)
CALCIUM: 7.2 mg/dL — AB (ref 8.9–10.3)
CO2: 19 mmol/L — AB (ref 22–32)
CREATININE: 6.42 mg/dL — AB (ref 0.61–1.24)
Chloride: 87 mmol/L — ABNORMAL LOW (ref 101–111)
GFR, EST AFRICAN AMERICAN: 10 mL/min — AB (ref >60)
GFR, EST NON AFRICAN AMERICAN: 9 mL/min — AB (ref >60)
Glucose, Bld: 157 mg/dL — ABNORMAL HIGH (ref 65–99)
Potassium: 4.8 mmol/L (ref 3.5–5.1)
SODIUM: 117 mmol/L — AB (ref 135–145)

## 2016-11-06 LAB — OSMOLALITY, URINE: OSMOLALITY UR: 224 mosm/kg — AB (ref 300–900)

## 2016-11-06 MED ORDER — RENA-VITE PO TABS
1.0000 | ORAL_TABLET | Freq: Every day | ORAL | Status: DC
  Administered 2016-11-06 – 2016-11-15 (×10): 1 via ORAL
  Filled 2016-11-06 (×10): qty 1

## 2016-11-06 MED ORDER — FUROSEMIDE 10 MG/ML IJ SOLN
160.0000 mg | Freq: Three times a day (TID) | INTRAMUSCULAR | Status: DC
  Administered 2016-11-06 – 2016-11-07 (×4): 160 mg via INTRAVENOUS
  Filled 2016-11-06 (×2): qty 16
  Filled 2016-11-06: qty 10
  Filled 2016-11-06 (×2): qty 16

## 2016-11-06 MED ORDER — ATORVASTATIN CALCIUM 40 MG PO TABS
40.0000 mg | ORAL_TABLET | Freq: Every day | ORAL | Status: DC
  Administered 2016-11-06 – 2016-11-15 (×10): 40 mg via ORAL
  Filled 2016-11-06 (×10): qty 1

## 2016-11-06 MED ORDER — INSULIN GLARGINE 100 UNIT/ML ~~LOC~~ SOLN
15.0000 [IU] | Freq: Every day | SUBCUTANEOUS | Status: DC
  Administered 2016-11-06 – 2016-11-15 (×10): 15 [IU] via SUBCUTANEOUS
  Filled 2016-11-06 (×11): qty 0.15

## 2016-11-06 MED ORDER — CALCITRIOL 0.25 MCG PO CAPS
0.2500 ug | ORAL_CAPSULE | Freq: Every day | ORAL | Status: DC
  Administered 2016-11-06 – 2016-11-16 (×11): 0.25 ug via ORAL
  Filled 2016-11-06 (×9): qty 1

## 2016-11-06 MED ORDER — ASPIRIN 81 MG PO CHEW
81.0000 mg | CHEWABLE_TABLET | Freq: Every day | ORAL | Status: DC
  Administered 2016-11-06 – 2016-11-16 (×11): 81 mg via ORAL
  Filled 2016-11-06 (×11): qty 1

## 2016-11-06 MED ORDER — CARVEDILOL 12.5 MG PO TABS
12.5000 mg | ORAL_TABLET | Freq: Two times a day (BID) | ORAL | Status: DC
  Administered 2016-11-06 – 2016-11-15 (×14): 12.5 mg via ORAL
  Filled 2016-11-06 (×14): qty 1

## 2016-11-06 MED ORDER — PENTAFLUOROPROP-TETRAFLUOROETH EX AERO: 1.0000 "application " | INHALATION_SPRAY | CUTANEOUS | Status: DC | PRN

## 2016-11-06 MED ORDER — FUROSEMIDE 10 MG/ML IJ SOLN
160.0000 mg | Freq: Once | INTRAVENOUS | Status: AC
  Administered 2016-11-06: 160 mg via INTRAVENOUS
  Filled 2016-11-06: qty 16

## 2016-11-06 MED ORDER — ONDANSETRON HCL 4 MG PO TABS: 4.0000 mg | ORAL_TABLET | Freq: Four times a day (QID) | ORAL | Status: DC | PRN

## 2016-11-06 MED ORDER — ALTEPLASE 2 MG IJ SOLR: 2.0000 mg | Freq: Once | INTRAMUSCULAR | Status: DC | PRN

## 2016-11-06 MED ORDER — IPRATROPIUM-ALBUTEROL 0.5-2.5 (3) MG/3ML IN SOLN: 3.0000 mL | RESPIRATORY_TRACT | Status: DC | PRN

## 2016-11-06 MED ORDER — HEPARIN SODIUM (PORCINE) 5000 UNIT/ML IJ SOLN
5000.0000 [IU] | Freq: Three times a day (TID) | INTRAMUSCULAR | Status: DC
  Administered 2016-11-06 – 2016-11-11 (×13): 5000 [IU] via SUBCUTANEOUS
  Filled 2016-11-06 (×14): qty 1

## 2016-11-06 MED ORDER — GABAPENTIN 300 MG PO CAPS
300.0000 mg | ORAL_CAPSULE | Freq: Every day | ORAL | Status: DC
  Administered 2016-11-06 – 2016-11-16 (×11): 300 mg via ORAL
  Filled 2016-11-06 (×11): qty 1

## 2016-11-06 MED ORDER — HEPARIN SODIUM (PORCINE) 1000 UNIT/ML DIALYSIS: 1000.0000 [IU] | INTRAMUSCULAR | Status: DC | PRN

## 2016-11-06 MED ORDER — INSULIN ASPART 100 UNIT/ML ~~LOC~~ SOLN
0.0000 [IU] | Freq: Three times a day (TID) | SUBCUTANEOUS | Status: DC
  Administered 2016-11-06: 3 [IU] via SUBCUTANEOUS
  Administered 2016-11-06: 1 [IU] via SUBCUTANEOUS
  Administered 2016-11-06 – 2016-11-07 (×2): 2 [IU] via SUBCUTANEOUS
  Administered 2016-11-07: 1 [IU] via SUBCUTANEOUS
  Administered 2016-11-08 – 2016-11-09 (×2): 2 [IU] via SUBCUTANEOUS
  Administered 2016-11-10 – 2016-11-12 (×5): 1 [IU] via SUBCUTANEOUS
  Administered 2016-11-13: 2 [IU] via SUBCUTANEOUS
  Administered 2016-11-13: 3 [IU] via SUBCUTANEOUS
  Administered 2016-11-14: 2 [IU] via SUBCUTANEOUS
  Administered 2016-11-15: 3 [IU] via SUBCUTANEOUS
  Administered 2016-11-15: 2 [IU] via SUBCUTANEOUS
  Administered 2016-11-15: 5 [IU] via SUBCUTANEOUS

## 2016-11-06 MED ORDER — ACETAMINOPHEN 650 MG RE SUPP: 650.0000 mg | Freq: Four times a day (QID) | RECTAL | Status: DC | PRN

## 2016-11-06 MED ORDER — LIDOCAINE-PRILOCAINE 2.5-2.5 % EX CREA: 1.0000 "application " | TOPICAL_CREAM | CUTANEOUS | Status: DC | PRN

## 2016-11-06 MED ORDER — LIDOCAINE HCL (PF) 1 % IJ SOLN: 5.0000 mL | INTRAMUSCULAR | Status: DC | PRN

## 2016-11-06 MED ORDER — ONDANSETRON HCL 4 MG/2ML IJ SOLN
4.0000 mg | Freq: Four times a day (QID) | INTRAMUSCULAR | Status: DC | PRN
  Administered 2016-11-09: 4 mg via INTRAVENOUS
  Filled 2016-11-06: qty 2

## 2016-11-06 MED ORDER — SODIUM CHLORIDE 0.9 % IV SOLN
100.0000 mL | INTRAVENOUS | Status: DC | PRN
  Administered 2016-11-08: 15:00:00 via INTRAVENOUS

## 2016-11-06 MED ORDER — SODIUM CHLORIDE 0.9 % IV SOLN: 100.0000 mL | INTRAVENOUS | Status: DC | PRN

## 2016-11-06 MED ORDER — ACETAMINOPHEN 325 MG PO TABS
650.0000 mg | ORAL_TABLET | Freq: Four times a day (QID) | ORAL | Status: DC | PRN
  Administered 2016-11-12: 650 mg via ORAL
  Filled 2016-11-06: qty 2

## 2016-11-06 NOTE — Progress Notes (Signed)
Na 115 (was 116). Pt here with hyponatremia, renal failure, among others. Nephrology consulting for both. Pt on Lasix q 8 and FR 800cc. Serial renal panels in place. Will continue to follow results. Further workup is pending.  KJKG, NP [facility]

## 2016-11-06 NOTE — ED Notes (Signed)
Critical Sodium level called from lab, Ian Bushman, RN made aware

## 2016-11-06 NOTE — Progress Notes (Addendum)
Critical Lab Sodium-115 MD notified

## 2016-11-06 NOTE — Progress Notes (Signed)
Received prior auth request for patient for [facility] Medicaid. Patient does not have current Medicaid. Will disregard request.

## 2016-11-06 NOTE — Progress Notes (Signed)
Agree with assessment and plan as per Dr. Tamala Julian Appreciate nephrology  In addition  56 y/o ? 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 CVA Hyperlipidemia  O/e plethoric, no ict,  ? JVD Anasarca   Polydipsia? Psychogenic-drinking? 1 gallon water a day Admitted due to concern of severe hyponatremia--urine osmolarity, random sodium still pending Diuresis Lasix as per nephrology Expect will dialysis this admit and then clipping--S/w consulted  Verneita Griffes, MD [facility] (P) [phone]

## 2016-11-06 NOTE — ED Provider Notes (Signed)
[facility] DEPT Provider Note   CSN: [id number] Arrival date & time: 11/05/16  2206  By signing my name below, I, Ny'Kea Lewis, attest that this documentation has been prepared under the direction and in the presence of Claire Dolores, Barbette Hair, MD. Electronically Signed: Lise Auer, ED Scribe. 11/06/16. 12:46 AM.  History   Chief Complaint Chief Complaint  Patient presents with  . Abnormal Lab   The history is provided by the patient and the spouse. No language interpreter was used.   HPI Comments: Russell Price is a 56 y.o. male with a history of CAD, CKD, DM, ESRD, HTN, and CVA who presents to the Emergency Department due to abnormal labs. He notes associated BLE/abdominal swelling, and diarrhea. Pt was seen by Dr. Moshe Cipro, today and he was notified this evening that his sodium levels were low and advised to come to the ED. Pt has a fistula placed to the let AV.  He is due to start dialysis in July. Denies abdominal pain.  Of note, discussed with Dr. Florene Glen. Patient with history of excessive water intake in an attempt to improve kidney function. This is likely the source of his hyponatremia. Recommend fluid restriction to 800 mL and 160 mg of IV Lasix. Will consult the morning.  Past Medical History:  Diagnosis Date  . CAD (coronary artery disease)    a. underwent cath in 02/2016 after an abnormal nuc which showed 2V CAD in RCA and OM3 with no good targets for PCI. Medical managment recommended.   . Chronic kidney disease   . Diabetes mellitus [place] complication ([place]    followed by Dr Chalmers Cater  . ESRD (end stage renal disease) ([place])   . Glaucoma   . Hypertension    followed by [facility]  . Stroke [facility])     Patient Active Problem List   Diagnosis Date Noted  . Hyponatremia 11/06/2016  . CAD (coronary artery disease)   . Abnormal nuclear stress test 03/04/2016  . Chronic renal insufficiency, stage IV (severe) ([place])   . Diabetic neuropathy ([place])  10/05/2015  . Left arm weakness   . Acute encephalopathy   . Hyperlipidemia 09/21/2015  . Labile blood pressure   . Labile blood glucose   . Diabetes mellitus type 2 in nonobese ([facility])   . Absolute anemia   . Debility 09/08/2015  . Acidemia   . Seizure-like activity ([place])   . Acute kidney injury ([place])   . Cerebral thrombosis with cerebral infarction ([place]) 02/18/2015  . Essential hypertension 11/11/2014  . DM (diabetes mellitus) with complications ([place]) 41/66/0630  . Blind right eye 11/11/2014    Past Surgical History:  Procedure Laterality Date  . AV FISTULA PLACEMENT Left 03/12/2016   Procedure: Left Arm ARTERIOVENOUS (AV) FISTULA CREATION;  Surgeon: Waynetta Sandy, MD;  Location: [place];  Service: Vascular;  Laterality: Left;  . BASCILIC VEIN TRANSPOSITION Left 05/14/2016   Procedure: SECOND STAGE LEFT BASILIC VEIN TRANSPOSITION;  Surgeon: Waynetta Sandy, MD;  Location: [place];  Service: Vascular;  Laterality: Left;  . BRONCHOSCOPY  02/14/2015   for pulm hemorrhage  . CARDIAC CATHETERIZATION N/A 03/04/2016   Procedure: Left Heart Cath and Coronary Angiography;  Surgeon: Leonie Man, MD;  Location: [place] [facility];  Service: Cardiovascular;  Laterality: N/A;  . EYE SURGERY Left        Home Medications    Prior to Admission medications   Medication Sig Start Date End Date Taking? Authorizing Provider  aspirin 81  MG chewable tablet Chew 1 tablet (81 mg total) by mouth daily. 09/14/15  Yes Angiulli, Lavon Paganini, PA-C  atorvastatin (LIPITOR) 40 MG tablet Take 1 tablet (40 mg total) by mouth daily at 6 PM. 09/02/16  Yes Amao, Charlane Ferretti, MD  calcitRIOL (ROCALTROL) 0.25 MCG capsule Take 0.25 mcg by mouth daily. 11/04/16  Yes [provider]  carvedilol (COREG) 12.5 MG tablet Take 12.5 mg by mouth 2 (two) times daily with a meal.   Yes [provider]  furosemide (LASIX) 80 MG tablet Take 160 mg by mouth 2 (two) times daily.    Yes [provider]  gabapentin (NEURONTIN) 300 MG capsule Take 1 capsule (300 mg total) by mouth daily. 09/02/16  Yes Arnoldo Morale, MD  insulin aspart (NOVOLOG) 100 UNIT/ML injection Inject 2-10 Units into the skin 3 (three) times daily before meals. 09/02/16  Yes Arnoldo Morale, MD  insulin glargine (LANTUS) 100 UNIT/ML injection Inject 0.15 mLs (15 Units total) into the skin at bedtime. 09/02/16  Yes Arnoldo Morale, MD  multivitamin (RENA-VIT) TABS tablet Take 1 tablet by mouth daily.   Yes [provider]  Blood Glucose Monitoring Suppl (ACCU-CHEK AVIVA) device Use as instructed 3 times daily before meals and at bedtime. 12/18/15   Arnoldo Morale, MD  carvedilol (COREG) 6.25 MG tablet Take 1 tablet (6.25 mg total) by mouth 2 (two) times daily with a meal. Patient not taking: Reported on 11/06/2016 09/02/16   Arnoldo Morale, MD  chlorthalidone (HYGROTON) 25 MG tablet Take 1 tablet (25 mg total) by mouth daily. Patient not taking: Reported on 11/06/2016 09/02/16   Arnoldo Morale, MD  glucose blood (ACCU-CHEK AVIVA PLUS) test strip Use 3 times daily before meals and at bedtime 12/18/15   Arnoldo Morale, MD  Lancet Devices [facility]) lancets Use as instructed 3 times daily before meals and at bedtime 12/18/15   Arnoldo Morale, MD  NIFEdipine (PROCARDIA XL/ADALAT-CC) 90 MG 24 hr tablet Take 1 tablet (90 mg total) by mouth daily. Patient not taking: Reported on 11/06/2016 09/02/16   Arnoldo Morale, MD    Family History Family History  Problem Relation Age of Onset  . Diabetes Mother     Social History Social History  Substance Use Topics  . Smoking status: Former Smoker    Packs/day: 0.00    Years: 4.00    Quit date: 02/23/1983  . Smokeless tobacco: Never Used  . Alcohol use No     Allergies   No known allergies   Review of Systems Review of Systems  Constitutional: Negative for fever.  Respiratory: Negative for shortness of breath.   Cardiovascular: Positive for leg swelling.  Negative for chest pain.  Gastrointestinal: Positive for abdominal distention and diarrhea. Negative for abdominal pain.  All other systems reviewed and are negative.    Physical Exam Updated Vital Signs BP (!) 158/71   Pulse (!) 57   Temp 98.7 F (37.1 C) (Oral)   Resp 14   SpO2 100%   Physical Exam  Constitutional: He is oriented to person, place, and time. No distress.  Chronically ill-appearing, no acute distress  HENT:  Head: Normocephalic and atraumatic.  Cardiovascular: Normal rate, regular rhythm and normal heart sounds.   No murmur heard. Fistula left upper extremity with positive thrill  Pulmonary/Chest: Effort normal and breath sounds normal. No respiratory distress. He has no wheezes.  Abdominal: Soft. Bowel sounds are normal. He exhibits distension. There is no tenderness. There is no rebound.  Abdominal distention without  tenderness  Musculoskeletal: He exhibits edema.  2+ pitting edema bilaterally  Neurological: He is alert and oriented to person, place, and time.  Skin: Skin is warm and dry.  Psychiatric: He has a normal mood and affect.  Nursing note and vitals reviewed.   ED Treatments / Results  DIAGNOSTIC STUDIES: Oxygen Saturation is 100% on RA, normal by my interpretation.   COORDINATION OF CARE: 12:41 AM-Discussed next steps with pt. Pt verbalized understanding and is agreeable with the plan.   Labs (all labs ordered are listed, but only abnormal results are displayed) Labs Reviewed  COMPREHENSIVE METABOLIC PANEL - Abnormal; Notable for the following:       Result Value   Sodium 115 (*)    Potassium 5.2 (*)    Chloride 86 (*)    CO2 13 (*)    Glucose, Bld 132 (*)    BUN 130 (*)    Creatinine, Ser 6.85 (*)    Calcium 7.3 (*)    Total Protein 6.1 (*)    Albumin 2.6 (*)    Alkaline Phosphatase 401 (*)    GFR calc non Af Amer 8 (*)    GFR calc Af Amer 9 (*)    Anion gap 16 (*)    All other components within normal limits  CBC WITH  DIFFERENTIAL/PLATELET - Abnormal; Notable for the following:    RBC 3.01 (*)    Hemoglobin 8.3 (*)    HCT 24.1 (*)    All other components within normal limits  URINALYSIS, ROUTINE W REFLEX MICROSCOPIC  BASIC METABOLIC PANEL    EKG  EKG Interpretation None       Radiology No results found.  Procedures Procedures (including critical care time)  Medications Ordered in ED Medications  calcitRIOL (ROCALTROL) capsule 0.25 mcg (not administered)  carvedilol (COREG) tablet 12.5 mg (not administered)  multivitamin (RENA-VIT) tablet 1 tablet (not administered)  atorvastatin (LIPITOR) tablet 40 mg (not administered)  insulin glargine (LANTUS) injection 15 Units (not administered)  aspirin chewable tablet 81 mg (not administered)  gabapentin (NEURONTIN) capsule 300 mg (not administered)  heparin injection 5,000 Units (not administered)  ondansetron (ZOFRAN) tablet 4 mg (not administered)    Or  ondansetron (ZOFRAN) injection 4 mg (not administered)  acetaminophen (TYLENOL) tablet 650 mg (not administered)    Or  acetaminophen (TYLENOL) suppository 650 mg (not administered)  ipratropium-albuterol (DUONEB) 0.5-2.5 (3) MG/3ML nebulizer solution 3 mL (not administered)  insulin aspart (novoLOG) injection 0-9 Units (not administered)     Initial Impression / Assessment and Plan / ED Course  I have reviewed the triage vital signs and the nursing notes.  Pertinent labs & imaging results that were available during my care of the patient were reviewed by me and considered in my medical decision making (see chart for details).     Patient presents with an abnormal lab. Notably hyponatremic. Excessive water intake. End-stage renal disease due to start dialysis in one month. Will admit for fluid restriction and Lasix. He is otherwise nontoxic-appearing.  After history, exam, and medical workup I feel the patient has been appropriately medically screened and is safe for discharge home.  Pertinent diagnoses were discussed with the patient. Patient was given return precautions.   Final Clinical Impressions(s) / ED Diagnoses   Final diagnoses:  Hyponatremia  ESRD (end stage renal disease) ([place])  Peripheral edema    New Prescriptions New Prescriptions   No medications on file   I personally performed the services described in this documentation,  which was scribed in my presence. The recorded information has been reviewed and is accurate.     Merryl Hacker, MD 11/06/16 [phone]

## 2016-11-06 NOTE — Consult Note (Signed)
[facility] Consult Note     Date: 11/06/2016                  Patient Name:  Russell Price  MRN: [id number]  DOB: 1960-08-22  Age / Sex: 56 y.o., male         PCP: Arnoldo Morale, MD                 Service Requesting Consult: [facility], Dr. Verlon Au                 Reason for Consult: hyponatremia            Chief Complaint: abnormal labs  HPI: Pt is a 48M with a PMH significant for CKD V secondary to DM II and HTN, CAD, and glaucoma who is now seen in consultation at the request of Dr. Verlon Au for evaluation and recommendations surrounding hyponatremia.    Pt has progressive CKD V and needs to begin dialysis soon.  He saw Dr. Moshe Cipro yesterday in clinic where he was noted to be nauseated and was felt to be uremic. He had labs drawn which were significant for a Na of 115, a BUN of 130, and a Cr of 6.9.  He had been drinking a gallon of water a day in an effort to improve his renal function.    He has had a fistula placed which probably can be used.  He is planning to go to [place] for 3 weeks to visit family.   Today, he reports decreased appetite and some nausea.  Wife at bedside, corroborates this.  No seizure activity noted.   Past Medical History:  Diagnosis Date  . CAD (coronary artery disease)    a. underwent cath in 02/2016 after an abnormal nuc which showed 2V CAD in RCA and OM3 with no good targets for PCI. Medical managment recommended.   . Chronic kidney disease   . Diabetes mellitus without complication ([place])    followed by Dr Chalmers Cater  . ESRD (end stage renal disease) ([place])   . Glaucoma   . Hypertension    followed by [facility]  . Stroke [facility])     Past Surgical History:  Procedure Laterality Date  . AV FISTULA PLACEMENT Left 03/12/2016   Procedure: Left Arm ARTERIOVENOUS (AV) FISTULA CREATION;  Surgeon: Waynetta Sandy, MD;  Location: [place];  Service: Vascular;  Laterality: Left;  . BASCILIC VEIN  TRANSPOSITION Left 05/14/2016   Procedure: SECOND STAGE LEFT BASILIC VEIN TRANSPOSITION;  Surgeon: Waynetta Sandy, MD;  Location: [place];  Service: Vascular;  Laterality: Left;  . BRONCHOSCOPY  02/14/2015   for pulm hemorrhage  . CARDIAC CATHETERIZATION N/A 03/04/2016   Procedure: Left Heart Cath and Coronary Angiography;  Surgeon: Leonie Man, MD;  Location: [place] [facility];  Service: Cardiovascular;  Laterality: N/A;  . EYE SURGERY Left     Family History  Problem Relation Age of Onset  . Diabetes Mother    Social History:  reports that he quit smoking about 33 years ago. He smoked 0.00 packs per day for 4.00 years. He has never used smokeless tobacco. He reports that he does not drink alcohol or use drugs.  Allergies:  Allergies  Allergen Reactions  . No Known Allergies     Medications Prior to Admission  Medication Sig Dispense Refill  . aspirin 81 MG chewable tablet Chew 1 tablet (81 mg total) by mouth daily.    Marland Kitchen atorvastatin (  LIPITOR) 40 MG tablet Take 1 tablet (40 mg total) by mouth daily at 6 PM. 30 tablet 5  . calcitRIOL (ROCALTROL) 0.25 MCG capsule Take 0.25 mcg by mouth daily.  6  . carvedilol (COREG) 12.5 MG tablet Take 12.5 mg by mouth 2 (two) times daily with a meal.    . furosemide (LASIX) 80 MG tablet Take 160 mg by mouth 2 (two) times daily.     Marland Kitchen gabapentin (NEURONTIN) 300 MG capsule Take 1 capsule (300 mg total) by mouth daily. 30 capsule 5  . insulin aspart (NOVOLOG) 100 UNIT/ML injection Inject 2-10 Units into the skin 3 (three) times daily before meals. 10 mL 5  . insulin glargine (LANTUS) 100 UNIT/ML injection Inject 0.15 mLs (15 Units total) into the skin at bedtime. 10 mL 5  . multivitamin (RENA-VIT) TABS tablet Take 1 tablet by mouth daily.    . Blood Glucose Monitoring Suppl (ACCU-CHEK AVIVA) device Use as instructed 3 times daily before meals and at bedtime. 1 each 0  . carvedilol (COREG) 6.25 MG tablet Take 1 tablet (6.25 mg total) by  mouth 2 (two) times daily with a meal. (Patient not taking: Reported on 11/06/2016) 60 tablet 6  . chlorthalidone (HYGROTON) 25 MG tablet Take 1 tablet (25 mg total) by mouth daily. (Patient not taking: Reported on 11/06/2016) 30 tablet 5  . glucose blood (ACCU-CHEK AVIVA PLUS) test strip Use 3 times daily before meals and at bedtime 100 each 12  . Lancet Devices (ACCU-CHEK SOFTCLIX) lancets Use as instructed 3 times daily before meals and at bedtime 1 each 12  . NIFEdipine (PROCARDIA XL/ADALAT-CC) 90 MG 24 hr tablet Take 1 tablet (90 mg total) by mouth daily. (Patient not taking: Reported on 11/06/2016) 30 tablet 5    Results for orders placed or performed during the hospital encounter of 11/06/16 (from the past 48 hour(s))  Comprehensive metabolic panel     Status: Abnormal   Collection Time: 11/05/16 10:50 PM  Result Value Ref Range   Sodium 115 (LL) 135 - 145 mmol/L    Comment: CRITICAL RESULT CALLED TO, READ BACK BY AND VERIFIED WITH: FIELDS,K RN 11/06/2016 0002 JORDANS    Potassium 5.2 (H) 3.5 - 5.1 mmol/L   Chloride 86 (L) 101 - 111 mmol/L   CO2 13 (L) 22 - 32 mmol/L   Glucose, Bld 132 (H) 65 - 99 mg/dL   BUN 130 (H) 6 - 20 mg/dL   Creatinine, Ser 6.85 (H) 0.61 - 1.24 mg/dL   Calcium 7.3 (L) 8.9 - 10.3 mg/dL   Total Protein 6.1 (L) 6.5 - 8.1 g/dL   Albumin 2.6 (L) 3.5 - 5.0 g/dL   AST 23 15 - 41 U/L   ALT 55 17 - 63 U/L   Alkaline Phosphatase 401 (H) 38 - 126 U/L   Total Bilirubin 1.0 0.3 - 1.2 mg/dL   GFR calc non Af Amer 8 (L) >60 mL/min   GFR calc Af Amer 9 (L) >60 mL/min    Comment: (NOTE) The eGFR has been calculated using the CKD EPI equation. This calculation has not been validated in all clinical situations. eGFR's persistently <60 mL/min signify possible Chronic Kidney Disease.    Anion gap 16 (H) 5 - 15  CBC with Differential     Status: Abnormal   Collection Time: 11/05/16 10:50 PM  Result Value Ref Range   WBC 4.0 4.0 - 10.5 K/uL   RBC 3.01 (L) 4.22 - 5.81  MIL/uL   Hemoglobin  8.3 (L) 13.0 - 17.0 g/dL   HCT 24.1 (L) 39.0 - 52.0 %   MCV 80.1 78.0 - 100.0 fL   MCH 27.6 26.0 - 34.0 pg   MCHC 34.4 30.0 - 36.0 g/dL   RDW 13.2 11.5 - 15.5 %   Platelets 171 150 - 400 K/uL   Neutrophils Relative % 67 %   Neutro Abs 2.7 1.7 - 7.7 K/uL   Lymphocytes Relative 19 %   Lymphs Abs 0.8 0.7 - 4.0 K/uL   Monocytes Relative 6 %   Monocytes Absolute 0.2 0.1 - 1.0 K/uL   Eosinophils Relative 7 %   Eosinophils Absolute 0.3 0.0 - 0.7 K/uL   Basophils Relative 1 %   Basophils Absolute 0.0 0.0 - 0.1 K/uL  Urinalysis, Routine w reflex microscopic     Status: Abnormal   Collection Time: 11/06/16  2:28 AM  Result Value Ref Range   Color, Urine YELLOW YELLOW   APPearance CLEAR CLEAR   Specific Gravity, Urine 1.006 1.005 - 1.030   pH 5.0 5.0 - 8.0   Glucose, UA NEGATIVE NEGATIVE mg/dL   Hgb urine dipstick MODERATE (A) NEGATIVE   Bilirubin Urine NEGATIVE NEGATIVE   Ketones, ur NEGATIVE NEGATIVE mg/dL   Protein, ur 30 (A) NEGATIVE mg/dL   Nitrite NEGATIVE NEGATIVE   Leukocytes, UA NEGATIVE NEGATIVE   RBC / HPF 0-5 0 - 5 RBC/hpf   WBC, UA 0-5 0 - 5 WBC/hpf   Bacteria, UA NONE SEEN NONE SEEN   Squamous Epithelial / LPF NONE SEEN NONE SEEN  Glucose, capillary     Status: Abnormal   Collection Time: 11/06/16  3:06 AM  Result Value Ref Range   Glucose-Capillary 120 (H) 65 - 99 mg/dL  Glucose, capillary     Status: Abnormal   Collection Time: 11/06/16  7:38 AM  Result Value Ref Range   Glucose-Capillary 133 (H) 65 - 99 mg/dL   Dg Chest Port 1 View  Result Date: 11/06/2016 CLINICAL DATA:  Initial evaluation for acute shortness of breath, abdominal pain. EXAM: PORTABLE CHEST 1 VIEW COMPARISON:  Prior radiograph from 03/12/2016. FINDINGS: Advanced cardiomegaly.  Mediastinal silhouette normal. Lungs hypoinflated. Perihilar vascular congestion with mild interstitial prominence, suggesting mild pulmonary interstitial edema. No frank alveolar edema. No focal  infiltrates. No pleural effusion. No pneumothorax. No acute osseous abnormality. IMPRESSION: Cardiomegaly with mild diffuse pulmonary interstitial congestion/ edema. Electronically Signed   By: Jeannine Boga M.D.   On: 11/06/2016 02:51    ROS: all other systems reviewed and are negative except as per HPI.    Blood pressure (!) 157/53, pulse 61, temperature 98.4 F (36.9 C), temperature source Oral, resp. rate 16, SpO2 100 %. Physical Exam  GEN ill appearing, NAD HEENT EOMI, PERRL, MMM NECK+ JVD PULM bilateral coarse rhonchi CV RRR no m/r/g ABD distended, abd wall edema, hypoactive bowel sounds EXT 3+ LE edema NEURO some slowed mentation, no frank asterixis SKIN some forming blisters subcutaneously  Assessment/Plan  1.  Hyponatremia: likely due to polydipsia and decreased free water excretion due to CKD.  Agree with fluid restriction of 800 mL.  Lasix 160 IV TID; renal function panel q 6 hrs.  I don't think tolvaptan will work really well in CKD V.  Ultimately he needs to start dialysis but need to get his Na up to at least above 120 before starting.  2.  CKD V--> ESRD: needs to start HD.  Was resistant in clinic when saw Dr. Moshe Cipro yesterday 6/19.  Will  plan for HD once Na > 120 (can use lower Na bath in HD, low BFR, shorter treatment time to start).  He was planning to go to [place] which is not a possiblity at this moment.  3.  Anemia: s/p IV iron, will need Aranesp  4.  Bone/ mineral: PTH/vit D  Madelon Lips, MD [facility] pgr [phone] 11/06/2016, 9:04 AM

## 2016-11-06 NOTE — H&P (Addendum)
History and Physical    RISHAAN GUNNER GYI:[id number] DOB: [birth year] DOA: 11/06/2016  Referring MD/NP/PA: Dr. Dina Rich PCP: Arnoldo Morale, MD  Patient coming from: Home  Chief Complaint: Abnormal lab work  HPI: Russell Price is a 56 y.o. male with medical history significant of ESRD, HTN, CAD, DM type II, and CVA; who presents after being instructed to come in for abnormal lab work. Patient was noted to have significantly low sodium levels. He has been drinking approximately four 16 oz bottles of water up to 1 gallon of water daily. He still is able to make urine currently. Patient reports over the last week having increasing abdominal and lower extremity swelling. Associated symptoms included shortness of breath and increased lethargy. Family present at bedside deny any signs of confusion. Patient denies having any fever, chills, nausea, vomiting, diarrhea, chest pain, or cough. He is followed by Dr. Moshe Cipro of nephrology and she recently wanted him to increase his Lasix 160 mg twice a day.  ED Course: Upon admission to the emergency department patient was seen to be  afebrile, blood pressure 175/85, and all other vital signs within normal limits. Labs revealed hemoglobin 8.3, sodium 1:15, potassium 5.2, chloride 86, CO2 13, BUN 1:30, creatinine 6.85, calcium 7.3, glucose 132. Nephrology was consulted and recommended giving the patient 160 mg Lasix IV 1 dose. Chest x-ray showing cardiomegaly with mild diffuse pulmonary edema.  Review of Systems: As per HPI otherwise 10 point review of systems negative.   Past Medical History:  Diagnosis Date  . CAD (coronary artery disease)    a. underwent cath in 02/2016 after an abnormal nuc which showed 2V CAD in RCA and OM3 with no good targets for PCI. Medical managment recommended.   . Chronic kidney disease   . Diabetes mellitus without complication ([place])    followed by Dr Chalmers Cater  . ESRD (end stage renal disease) ([place])   . Glaucoma   .  Hypertension    followed by [facility]  . Stroke [facility])     Past Surgical History:  Procedure Laterality Date  . AV FISTULA PLACEMENT Left 03/12/2016   Procedure: Left Arm ARTERIOVENOUS (AV) FISTULA CREATION;  Surgeon: Waynetta Sandy, MD;  Location: [place];  Service: Vascular;  Laterality: Left;  . BASCILIC VEIN TRANSPOSITION Left 05/14/2016   Procedure: SECOND STAGE LEFT BASILIC VEIN TRANSPOSITION;  Surgeon: Waynetta Sandy, MD;  Location: [place];  Service: Vascular;  Laterality: Left;  . BRONCHOSCOPY  02/14/2015   for pulm hemorrhage  . CARDIAC CATHETERIZATION N/A 03/04/2016   Procedure: Left Heart Cath and Coronary Angiography;  Surgeon: Leonie Man, MD;  Location: [place] [facility];  Service: Cardiovascular;  Laterality: N/A;  . EYE SURGERY Left      reports that he quit smoking about 33 years ago. He smoked 0.00 packs per day for 4.00 years. He has never used smokeless tobacco. He reports that he does not drink alcohol or use drugs.  Allergies  Allergen Reactions  . No Known Allergies     Family History  Problem Relation Age of Onset  . Diabetes Mother     Prior to Admission medications   Medication Sig Start Date End Date Taking? Authorizing Provider  aspirin 81 MG chewable tablet Chew 1 tablet (81 mg total) by mouth daily. 09/14/15  Yes Angiulli, Lavon Paganini, PA-C  atorvastatin (LIPITOR) 40 MG tablet Take 1 tablet (40 mg total) by mouth daily at 6 PM. 09/02/16  Yes Amao, Enobong,  MD  calcitRIOL (ROCALTROL) 0.25 MCG capsule Take 0.25 mcg by mouth daily. 11/04/16  Yes [provider]  carvedilol (COREG) 12.5 MG tablet Take 12.5 mg by mouth 2 (two) times daily with a meal.   Yes [provider]  furosemide (LASIX) 80 MG tablet Take 160 mg by mouth 2 (two) times daily.    Yes [provider]  gabapentin (NEURONTIN) 300 MG capsule Take 1 capsule (300 mg total) by mouth daily. 09/02/16  Yes Arnoldo Morale, MD  insulin  aspart (NOVOLOG) 100 UNIT/ML injection Inject 2-10 Units into the skin 3 (three) times daily before meals. 09/02/16  Yes Arnoldo Morale, MD  insulin glargine (LANTUS) 100 UNIT/ML injection Inject 0.15 mLs (15 Units total) into the skin at bedtime. 09/02/16  Yes Arnoldo Morale, MD  multivitamin (RENA-VIT) TABS tablet Take 1 tablet by mouth daily.   Yes [provider]  Blood Glucose Monitoring Suppl (ACCU-CHEK AVIVA) device Use as instructed 3 times daily before meals and at bedtime. 12/18/15   Arnoldo Morale, MD  carvedilol (COREG) 6.25 MG tablet Take 1 tablet (6.25 mg total) by mouth 2 (two) times daily with a meal. Patient not taking: Reported on 11/06/2016 09/02/16   Arnoldo Morale, MD  chlorthalidone (HYGROTON) 25 MG tablet Take 1 tablet (25 mg total) by mouth daily. Patient not taking: Reported on 11/06/2016 09/02/16   Arnoldo Morale, MD  glucose blood (ACCU-CHEK AVIVA PLUS) test strip Use 3 times daily before meals and at bedtime 12/18/15   Arnoldo Morale, MD  Lancet Devices [facility]) lancets Use as instructed 3 times daily before meals and at bedtime 12/18/15   Arnoldo Morale, MD  NIFEdipine (PROCARDIA XL/ADALAT-CC) 90 MG 24 hr tablet Take 1 tablet (90 mg total) by mouth daily. Patient not taking: Reported on 11/06/2016 09/02/16   Arnoldo Morale, MD    Physical Exam:    Constitutional: NAD, calm, comfortable Vitals:   11/05/16 2235 11/06/16 0101 11/06/16 0115  BP: (!) 158/65 (!) 175/85 (!) 158/71  Pulse: (!) 59 60 (!) 57  Resp: 17 18 14   Temp: 98.5 F (36.9 C) 98.7 F (37.1 C)   TempSrc: Oral Oral   SpO2: 100% 96% 100%   Eyes: PERRL, lids and conjunctivae normal ENMT: Mucous membranes are moist. Posterior pharynx clear of any exudate or lesions. .  Neck: normal, supple, no masses, no thyromegaly Respiratory: Decreased aeration with positive crackles appreciated in both lung fields. Normal respiratory effort.   Cardiovascular: Regular rate and rhythm, no murmurs / rubs /  gallops. +3 pitting lower extremity edema. 2+ pedal pulses. No carotid bruits. Left upper extremity fistula present. Abdomen: Moderate abdominal distention with positive fluid wave, no tenderness, no masses palpated. No hepatosplenomegaly. Bowel sounds positive.  Musculoskeletal: no clubbing / cyanosis. No joint deformity upper and lower extremities. Good ROM, no contractures. Normal muscle tone.  Skin: no rashes, lesions, ulcers. No induration Neurologic: CN 2-12 grossly intact. Sensation intact, DTR normal. Strength 5/5 in all 4.  Psychiatric: Normal judgment and insight. Alert and oriented x 3. Normal mood.     Labs on Admission: I have personally reviewed following labs and imaging studies  CBC:  Recent Labs Lab 11/05/16 2250  WBC 4.0  NEUTROABS 2.7  HGB 8.3*  HCT 24.1*  MCV 80.1  PLT 242   Basic Metabolic Panel:  Recent Labs Lab 11/05/16 2250  NA 115*  K 5.2*  CL 86*  CO2 13*  GLUCOSE 132*  BUN 130*  CREATININE 6.85*  CALCIUM 7.3*  GFR: CrCl cannot be calculated (Unknown ideal weight.). Liver Function Tests:  Recent Labs Lab 11/05/16 2250  AST 23  ALT 55  ALKPHOS 401*  BILITOT 1.0  PROT 6.1*  ALBUMIN 2.6*   No results for input(s): LIPASE, AMYLASE in the last 168 hours. No results for input(s): AMMONIA in the last 168 hours. Coagulation Profile: No results for input(s): INR, PROTIME in the last 168 hours. Cardiac Enzymes: No results for input(s): CKTOTAL, CKMB, CKMBINDEX, TROPONINI in the last 168 hours. BNP (last 3 results) No results for input(s): PROBNP in the last 8760 hours. HbA1C: No results for input(s): HGBA1C in the last 72 hours. CBG: No results for input(s): GLUCAP in the last 168 hours. Lipid Profile: No results for input(s): CHOL, HDL, LDLCALC, TRIG, CHOLHDL, LDLDIRECT in the last 72 hours. Thyroid Function Tests: No results for input(s): TSH, T4TOTAL, FREET4, T3FREE, THYROIDAB in the last 72 hours. Anemia Panel: No results for  input(s): VITAMINB12, FOLATE, FERRITIN, TIBC, IRON, RETICCTPCT in the last 72 hours. Urine analysis:    Component Value Date/Time   COLORURINE YELLOW 09/05/2015 1743   APPEARANCEUR CLEAR 09/05/2015 1743   LABSPEC 1.017 09/05/2015 1743   PHURINE 5.5 09/05/2015 1743   GLUCOSEU >1000 (A) 09/05/2015 1743   HGBUR SMALL (A) 09/05/2015 1743   BILIRUBINUR NEGATIVE 09/05/2015 1743   BILIRUBINUR negative 02/27/2015 1458   BILIRUBINUR neg 02/22/2013 1737   KETONESUR 15 (A) 09/05/2015 1743   PROTEINUR 100 (A) 09/05/2015 1743   UROBILINOGEN 0.2 02/28/2015 0032   NITRITE NEGATIVE 09/05/2015 1743   LEUKOCYTESUR NEGATIVE 09/05/2015 1743   Sepsis Labs: No results found for this or any previous visit (from the past 240 hour(s)).   Radiological Exams on Admission: No results found.  EKG: Independently reviewed. Sinus rhythm  Assessment/Plan Hyponatremia: Acute: Sodium 115 on admission and was previously 132 on 4/16. No reported confusion. Nephrology was consulted and recommended fluid restriction and to give the patient at least 1 dose of 160 mg of Lasix . - Admit to a MedSurg bed - Fluid restriction 800 ml fluid daily - Strict intake and output - Lasix 160 mg IV, follow-up with nephrology for further recommendations - Continue calcitriol - Nephrology consultated and will see in a.m.   Hyperkalemia: Potassium mildly elevated at 5.2 - Follow-up repeat BMP  ESRD with signs of fluid overload: Patient with working fistula present of the left upper extremity. Followed by Dr. Moshe Cipro of nephrology as outpatient he had been  scheduled to start dialysis next month. Patient showing signs of being fluid overloaded at this time as chest x-ray showed cardiomegaly with signs of pulmonary edema. - Continuous pulse oximetry with nasal cannula oxygen as needed to keep O2 sats greater than 92% - DuoNeb's prn shortness of breath/wheezing - Per nephrology  Essential hypertension - Continue  Coreg  Diabetes mellitus type 2 - Hypoglycemic protocols - Continue home Lantus - CBGs every before meals with sensitive SSI  Anemia of chronic disease: Hemoglobin 8.3 with previous baseline hemoglobin around 10. Patient denies any active sources of bleeding. - Repeat CBC in a.m.  Hyperlipidemia - Continue Lipitor   DVT prophylaxis: heparin Code Status: Full Family Communication:  Disposition Plan:  likely discharge home once medically stable  Consults called:  nephrology  Admission status: Inpatient   Norval Morton MD [facility] Pager [phone]  If 7PM-7AM, please contact night-coverage [email or web address] Password [email or web address]  11/06/2016, 1:50 AM

## 2016-11-06 NOTE — Progress Notes (Signed)
CRITICAL VALUE ALERT  Critical Value:  Sodium level 116  Date & Time Notied:  11/06/2016 1742  Provider Notified: yes Dr Verlon Au   Orders Received/Actions taken: Awaiting orders if  any

## 2016-11-07 ENCOUNTER — Encounter: Payer: Self-pay | Admitting: Family Medicine

## 2016-11-07 LAB — RENAL FUNCTION PANEL
ALBUMIN: 2.3 g/dL — AB (ref 3.5–5.0)
ALBUMIN: 2.3 g/dL — AB (ref 3.5–5.0)
ANION GAP: 14 (ref 5–15)
ANION GAP: 11 (ref 5–15)
Albumin: 2.4 g/dL — ABNORMAL LOW (ref 3.5–5.0)
Anion gap: 9 (ref 5–15)
BUN: 127 mg/dL — AB (ref 6–20)
BUN: 105 mg/dL — ABNORMAL HIGH (ref 6–20)
BUN: 105 mg/dL — ABNORMAL HIGH (ref 6–20)
CALCIUM: 7 mg/dL — AB (ref 8.9–10.3)
CHLORIDE: 86 mmol/L — AB (ref 101–111)
CHLORIDE: 87 mmol/L — AB (ref 101–111)
CO2: 15 mmol/L — AB (ref 22–32)
CO2: 19 mmol/L — ABNORMAL LOW (ref 22–32)
CO2: 22 mmol/L (ref 22–32)
CREATININE: 5.35 mg/dL — AB (ref 0.61–1.24)
Calcium: 7 mg/dL — ABNORMAL LOW (ref 8.9–10.3)
Calcium: 7 mg/dL — ABNORMAL LOW (ref 8.9–10.3)
Chloride: 89 mmol/L — ABNORMAL LOW (ref 101–111)
Creatinine, Ser: 6.18 mg/dL — ABNORMAL HIGH (ref 0.61–1.24)
Creatinine, Ser: 5.18 mg/dL — ABNORMAL HIGH (ref 0.61–1.24)
GFR calc Af Amer: 10 mL/min — ABNORMAL LOW (ref >60)
GFR calc Af Amer: 13 mL/min — ABNORMAL LOW (ref >60)
GFR calc non Af Amer: 9 mL/min — ABNORMAL LOW (ref >60)
GFR calc non Af Amer: 11 mL/min — ABNORMAL LOW (ref >60)
GFR, EST AFRICAN AMERICAN: 12 mL/min — AB (ref >60)
GFR, EST NON AFRICAN AMERICAN: 11 mL/min — AB (ref >60)
GLUCOSE: 145 mg/dL — AB (ref 65–99)
GLUCOSE: 106 mg/dL — AB (ref 65–99)
Glucose, Bld: 135 mg/dL — ABNORMAL HIGH (ref 65–99)
PHOSPHORUS: 6.9 mg/dL — AB (ref 2.5–4.6)
PHOSPHORUS: 5.6 mg/dL — AB (ref 2.5–4.6)
POTASSIUM: 4.4 mmol/L (ref 3.5–5.1)
Phosphorus: 6.1 mg/dL — ABNORMAL HIGH (ref 2.5–4.6)
Potassium: 3.9 mmol/L (ref 3.5–5.1)
Potassium: 4.4 mmol/L (ref 3.5–5.1)
SODIUM: 118 mmol/L — AB (ref 135–145)
Sodium: 115 mmol/L — CL (ref 135–145)
Sodium: 119 mmol/L — CL (ref 135–145)

## 2016-11-07 LAB — CBC WITH DIFFERENTIAL/PLATELET
BASOS PCT: 1 %
Basophils Absolute: 0 10*3/uL (ref 0.0–0.1)
Eosinophils Absolute: 0.4 10*3/uL (ref 0.0–0.7)
Eosinophils Relative: 11 %
HEMATOCRIT: 22 % — AB (ref 39.0–52.0)
Hemoglobin: 7.6 g/dL — ABNORMAL LOW (ref 13.0–17.0)
LYMPHS ABS: 0.7 10*3/uL (ref 0.7–4.0)
LYMPHS PCT: 20 %
MCH: 26.9 pg (ref 26.0–34.0)
MCHC: 34.5 g/dL (ref 30.0–36.0)
MCV: 77.7 fL — AB (ref 78.0–100.0)
MONO ABS: 0.3 10*3/uL (ref 0.1–1.0)
MONOS PCT: 7 %
NEUTROS ABS: 2.3 10*3/uL (ref 1.7–7.7)
NEUTROS PCT: 61 %
Platelets: 141 10*3/uL — ABNORMAL LOW (ref 150–400)
RBC: 2.83 MIL/uL — ABNORMAL LOW (ref 4.22–5.81)
RDW: 12.7 % (ref 11.5–15.5)
WBC: 3.7 10*3/uL — ABNORMAL LOW (ref 4.0–10.5)

## 2016-11-07 LAB — GLUCOSE, CAPILLARY
GLUCOSE-CAPILLARY: 142 mg/dL — AB (ref 65–99)
Glucose-Capillary: 120 mg/dL — ABNORMAL HIGH (ref 65–99)
Glucose-Capillary: 158 mg/dL — ABNORMAL HIGH (ref 65–99)
Glucose-Capillary: 116 mg/dL — ABNORMAL HIGH (ref 65–99)

## 2016-11-07 LAB — SODIUM, URINE, RANDOM: Sodium, Ur: 11 mmol/L

## 2016-11-07 LAB — IRON AND TIBC
Iron: 81 ug/dL (ref 45–182)
Saturation Ratios: 44 % — ABNORMAL HIGH (ref 17.9–39.5)
TIBC: 186 ug/dL — ABNORMAL LOW (ref 250–450)
UIBC: 105 ug/dL

## 2016-11-07 LAB — MRSA PCR SCREENING: MRSA BY PCR: NEGATIVE

## 2016-11-07 LAB — RETICULOCYTES
RBC.: 2.87 MIL/uL — AB (ref 4.22–5.81)
RETIC COUNT ABSOLUTE: 51.7 10*3/uL (ref 19.0–186.0)
Retic Ct Pct: 1.8 % (ref 0.4–3.1)

## 2016-11-07 LAB — FERRITIN: Ferritin: 1461 ng/mL — ABNORMAL HIGH (ref 24–336)

## 2016-11-07 LAB — FOLATE: FOLATE: 21.5 ng/mL (ref >5.9)

## 2016-11-07 LAB — OCCULT BLOOD X 1 CARD TO LAB, STOOL: FECAL OCCULT BLD: NEGATIVE

## 2016-11-07 LAB — VITAMIN B12: VITAMIN B 12: 1161 pg/mL — AB (ref 180–914)

## 2016-11-07 LAB — PREPARE RBC (CROSSMATCH)

## 2016-11-07 MED ORDER — DIPHENHYDRAMINE HCL 25 MG PO CAPS
25.0000 mg | ORAL_CAPSULE | Freq: Once | ORAL | Status: AC
  Administered 2016-11-07: 25 mg via ORAL
  Filled 2016-11-07: qty 1

## 2016-11-07 MED ORDER — FUROSEMIDE 10 MG/ML IJ SOLN
20.0000 mg | Freq: Once | INTRAMUSCULAR | Status: AC
  Administered 2016-11-07: 20 mg via INTRAVENOUS
  Filled 2016-11-07 (×2): qty 2

## 2016-11-07 MED ORDER — SODIUM CHLORIDE 0.9 % IV SOLN
Freq: Once | INTRAVENOUS | Status: AC
  Administered 2016-11-07: 14:00:00 via INTRAVENOUS

## 2016-11-07 NOTE — Care Management Note (Signed)
Case Management Note  Patient Details  Name: MARK HASSEY MRN: [id number] Date of Birth: 14-Jan-1961  Subjective/Objective:      CM following for progression and d/c planning.               Action/Plan: 11/07/2016 Pt discussed with CSW, Crawford Givens. Lorriane Shire is working on pt Medicaid status and transportation issues. Per Lorriane Shire, [employer] states that they have provided this family with an application for Medicaid transportation , Lorriane Shire will followup with family re status of completion of this application.  Family with concerns re cost of hospitalizations. Currently Medicaid is contingent upon the pt having medical cost of $5000 in order to qualify for Medicaid. Given this hospitalization the pt should easily meet this criteria. CLIP process in place to arrange outpatient hemodialysis and when  bills are available to the pt he/family will need to submit to [employer] in order to meet Medicaid criteria.  Continuing to follow for d/c needs.  Expected Discharge Date:                  Expected Discharge Plan:  [place]  In-House Referral:  Clinical Social Work  Discharge planning Services  CM Consult  Post Acute Care Choice:    Choice offered to:     DME Arranged:    DME Agency:     HH Arranged:    [place] Agency:     Status of Service:  In process, will continue to follow  If discussed at Long Length of Stay Meetings, dates discussed:    Additional Comments:  Adron Bene, RN 11/07/2016, 2:44 PM

## 2016-11-07 NOTE — Progress Notes (Signed)
Notified MD of critical lab- Sodium-115.

## 2016-11-07 NOTE — Progress Notes (Addendum)
PROGRESS NOTE    Russell Price  POE:[id number] DOB: 1960/07/14 DOA: 11/06/2016 PCP: Arnoldo Morale, MD  Outpatient Specialists:     Brief Narrative:  56 y/o ? 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 CVA Hyperlipidemia  Admitted early morning 6/20 severe hyponatremia 115, usual baseline 132, hyperkalemia 5.2 ? Volume overload secondary to oliguric renal failure Nephrology consulted   Assessment & Plan:   Principal Problem:   Hyponatremia Active Problems:   Essential hypertension   DM (diabetes mellitus) with complications (HCC)   Anemia of chronic disease   ESRD (end stage renal disease) (HCC)   Fluid overload   Psychogenic polydipsia-admission sodium 115, baseline 09/02/16 was 132 CK D5--->ESRD declared this admission Renal acidosis L AV fistula placement 05/14/2016 Secondary hyperparathyroidism  Urine osmolality is low however urine sodium not collected for some reason-specific gravity 1.006 points to  hypervolemic state   At this juncture inconsequential, he has hypervolemic hyponatremia clinically secondary to volume overload and oliguria, sodium has improved since admission to 119  First episode of dialysis 6/21,  Phosphorous binder, calcium carbonate and dialysate bath with micronutrient replacement as per nephrology  Might require sodium bicarbonate?  Probably can stop lasix 160 tid?  Continue  strict I/O for now  Anemia, microcytic External thrombosed hemorrhoid 6:00, 4:00  Patient has never had a colonoscopy  Follow Hemoccult  iron studies which have been ordered as add-on 6/21   dependent on results may require IV iron  CAD-microvascular disease based on cath 1016 3154 Chronic diastolic heart failure without acute component, EF 00% htn with complication of hypertensive glomerulosclerosis  Management will now be via dialysis  Cont for now Lasix 160 twice a day PTA, holding chlorthalidone 25 daily  Dosing of Coreg 12.5 bid per nephrologist, he has not been taking Procardia XL 90 mg for CAD  Diabetic neuropathy, nephropathy type II since last 20 years  Continue Lantus 15 units  Continue sliding scale coverage  Blood sugars reasonable below 150  Continue gabapentin 300 daily for neuropathy   Hyperlipidemia  continue Lipitor  Moderate malnutrition  Consider supplements with NePro, protein level is less than 3   Full CODE STATUS SCD Inpatient Disposition unclear-I've discussed with social work regarding clip as well as initiation of dialysis and discussed clearly with family transport issues and 3 times a week schedule. They may require coordination with bus service prior to discharge   Consultants:   Nephrology  Procedures:   Dialysis  Antimicrobials:   None    Subjective:  Overall feels better Feels less swollen Overall body does not feel weak No chest pain No nausea No shortness of breath  Objective: Vitals:   11/07/16 0800 11/07/16 0830 11/07/16 0842 11/07/16 1004  BP:  125/67 (!) 145/68 (!) 153/72 138/62  Pulse: 64 65 66 65  Resp:   16 15  Temp:   97.9 F (36.6 C) 97.8 F (36.6 C)  TempSrc:   Oral Oral  SpO2:   97% 100%  Weight:   87.6 kg (193 lb 2 oz)     Intake/Output Summary (Last 24 hours) at 11/07/16 1025 Last data filed at 11/07/16 1005  Gross per 24 hour  Intake              878 ml  Output             3321 ml  Net             -2443 ml   Filed Weights   11/06/16 2113 11/07/16 0700 11/07/16 0842  Weight: 87.1 kg (192 lb) 87.9 kg (193 lb 12.6 oz) 87.6 kg (193 lb 2 oz)    Examination:  Less plethoric, face less swollen Scleral changes Mild JVD S1 and S2 no murmur rub or gallop Abdomen: Slightly distended with mild volume Pedis and scrotum is swollen and patient has clinical anasarca still Rectal performed with no blood on glove, noted external hemorrhoids 6 PM, 4 PM position psych affect flat Skin swollen as above no rash   Data Reviewed: I have personally reviewed following labs and imaging studies  CBC:  Recent Labs Lab 11/05/16 2250 11/06/16 1639 11/07/16 0241  WBC 4.0 3.7* 3.7*  NEUTROABS 2.7  --  2.3  HGB 8.3* 8.4* 7.6*  HCT 24.1* 24.0* 22.0*  MCV 80.1 78.4 77.7*  PLT 171 146* 222*   Basic Metabolic Panel:  Recent Labs Lab 11/06/16 0856 11/06/16 1047 11/06/16 1639 11/06/16 2046 11/07/16 0241 11/07/16 0740  NA 116* 117* 116* 115* 115* 119*  K 4.8 4.8 4.5 4.5 4.4 3.9  CL 87* 87* 86* 87* 86* 89*  CO2 15* 19* 19* 16* 15* 19*  GLUCOSE 134* 157* 150* 153* 145* 106*  BUN 132* 129* 129* 127* 127* 105*  CREATININE 6.64* 6.42* 6.31* 6.22* 6.18* 5.18*  CALCIUM 7.2* 7.2* 7.2* 7.1* 7.0* 7.0*  PHOS 7.1*  --  7.0* 7.0* 6.9* 5.6*   GFR: Estimated Creatinine Clearance: 17.3 mL/min (A) (by C-G formula based on SCr of 5.18 mg/dL (H)). Liver Function Tests:  Recent Labs Lab 11/05/16 2250 11/06/16 0856 11/06/16 1639 11/06/16 2046 11/07/16 0241 11/07/16 0740  AST 23  --   --   --   --   --   ALT 55  --   --   --   --   --   ALKPHOS 401*  --   --   --   --   --   BILITOT 1.0  --   --   --   --   --   PROT 6.1*  --   --   --   --   --   ALBUMIN 2.6* 2.5* 2.3* 2.3* 2.3* 2.3*   No results for input(s): LIPASE, AMYLASE in the last 168 hours. No results for input(s): AMMONIA in the last 168 hours. Coagulation Profile: No results for input(s): INR, PROTIME in the last 168 hours. Cardiac  Enzymes: No results for input(s): CKTOTAL, CKMB, CKMBINDEX, TROPONINI in the last 168 hours. BNP (last 3 results) No results for input(s): PROBNP in the last 8760 hours. HbA1C: No results for input(s): HGBA1C in the last 72 hours. CBG:  Recent Labs Lab 11/06/16 0738 11/06/16 1231 11/06/16 1627 11/06/16 2114 11/07/16 0958  GLUCAP 133*  154* 201* 166* 120*   Lipid Profile: No results for input(s): CHOL, HDL, LDLCALC, TRIG, CHOLHDL, LDLDIRECT in the last 72 hours. Thyroid Function Tests: No results for input(s): TSH, T4TOTAL, FREET4, T3FREE, THYROIDAB in the last 72 hours. Anemia Panel: No results for input(s): VITAMINB12, FOLATE, FERRITIN, TIBC, IRON, RETICCTPCT in the last 72 hours. Urine analysis:    Component Value Date/Time   COLORURINE YELLOW 11/06/2016 0228   APPEARANCEUR CLEAR 11/06/2016 0228   LABSPEC 1.006 11/06/2016 0228   PHURINE 5.0 11/06/2016 0228   GLUCOSEU NEGATIVE 11/06/2016 0228   HGBUR MODERATE (A) 11/06/2016 0228   BILIRUBINUR NEGATIVE 11/06/2016 0228   BILIRUBINUR negative 02/27/2015 1458   BILIRUBINUR neg 02/22/2013 1737   KETONESUR NEGATIVE 11/06/2016 0228   PROTEINUR 30 (A) 11/06/2016 0228   UROBILINOGEN 0.2 02/28/2015 0032   NITRITE NEGATIVE 11/06/2016 0228   LEUKOCYTESUR NEGATIVE 11/06/2016 0228   Sepsis Labs: @LABRCNTIP (procalcitonin:4,lacticidven:4)  )No results found for this or any previous visit (from the past 240 hour(s)).       Radiology Studies: Dg Chest Port 1 View  Result Date: 11/06/2016 CLINICAL DATA:  Initial evaluation for acute shortness of breath, abdominal pain. EXAM: PORTABLE CHEST 1 VIEW COMPARISON:  Prior radiograph from 03/12/2016. FINDINGS: Advanced cardiomegaly.  Mediastinal silhouette normal. Lungs hypoinflated. Perihilar vascular congestion with mild interstitial prominence, suggesting mild pulmonary interstitial edema. No frank alveolar edema. No focal infiltrates. No pleural effusion. No pneumothorax. No acute  osseous abnormality. IMPRESSION: Cardiomegaly with mild diffuse pulmonary interstitial congestion/ edema. Electronically Signed   By: Jeannine Boga M.D.   On: 11/06/2016 02:51        Scheduled Meds: . aspirin  81 mg Oral Daily  . atorvastatin  40 mg Oral q1800  . calcitRIOL  0.25 mcg Oral Daily  . carvedilol  12.5 mg Oral BID WC  . diphenhydrAMINE  25 mg Oral Once  . gabapentin  300 mg Oral Daily  . heparin  5,000 Units Subcutaneous Q8H  . insulin aspart  0-9 Units Subcutaneous TID WC  . insulin glargine  15 Units Subcutaneous QHS  . multivitamin  1 tablet Oral QHS   Continuous Infusions: . sodium chloride    . sodium chloride    . sodium chloride    . furosemide Stopped (11/07/16 0258)     LOS: 1 day    Time spent: [place], MD [facility]   If 7PM-7AM, please contact night-coverage [email or web address] Password [facility] 11/07/2016, 10:25 AM

## 2016-11-07 NOTE — Progress Notes (Signed)
Dialysis treatment discontinued 25 minutes early related to infiltration of venous site. Pt mistakenly shift positioned of access arm and venous site began to swell.   Needles pulled and ice applied to site.  Dr. Hollie Salk notified.

## 2016-11-07 NOTE — Progress Notes (Addendum)
  [facility] Progress Note   Assessment/ Plan:   1.  Hyponatremia: likely due to polydipsia and decreased free water excretion due to CKD.  Continue fluid restriction, d/c Lasix now that on HD.  2.  CKD V--> ESRD: HD #1 today; using fistula. Plan for #2 tomorrow    3.  Anemia: s/p IV iron, Aranesp 150 mcg q Friday, 1u pRBCs today  4.  Bone/ mineral: PTH/vit D  5.  Volume; overloaded, expect to improve with HD  Subjective:    HD #1 today, venous infiltration after 1.5 hrs but cannulation fine   Objective:   BP 138/62 (BP Location: Right Arm)   Pulse 65   Temp 97.8 F (36.6 C) (Oral)   Resp 15   Wt 87.6 kg (193 lb 2 oz) Comment: standing  SpO2 100%   BMI 27.71 kg/m   Physical Exam: GEN ill appearing, NAD HEENT MMM NECK+ JVD PULM bilateral coarse rhonchi CV RRR no m/r/g ABD distended, abd wall edema, hypoactive bowel sounds EXT 3+ LE edema NEURO some slowed mentation, no frank asterixis SKIN some forming blisters subcutaneously  Labs: BMET  Recent Labs Lab 11/05/16 2250 11/06/16 0856 11/06/16 1047 11/06/16 1639 11/06/16 2046 11/07/16 0241 11/07/16 0740  NA 115* 116* 117* 116* 115* 115* 119*  K 5.2* 4.8 4.8 4.5 4.5 4.4 3.9  CL 86* 87* 87* 86* 87* 86* 89*  CO2 13* 15* 19* 19* 16* 15* 19*  GLUCOSE 132* 134* 157* 150* 153* 145* 106*  BUN 130* 132* 129* 129* 127* 127* 105*  CREATININE 6.85* 6.64* 6.42* 6.31* 6.22* 6.18* 5.18*  CALCIUM 7.3* 7.2* 7.2* 7.2* 7.1* 7.0* 7.0*  PHOS  --  7.1*  --  7.0* 7.0* 6.9* 5.6*   CBC  Recent Labs Lab 11/05/16 2250 11/06/16 1639 11/07/16 0241  WBC 4.0 3.7* 3.7*  NEUTROABS 2.7  --  2.3  HGB 8.3* 8.4* 7.6*  HCT 24.1* 24.0* 22.0*  MCV 80.1 78.4 77.7*  PLT 171 146* 141*    @IMGRELPRIORS @ Medications:    . aspirin  81 mg Oral Daily  . atorvastatin  40 mg Oral q1800  . calcitRIOL  0.25 mcg Oral Daily  . carvedilol  12.5 mg Oral BID WC  . diphenhydrAMINE  25 mg Oral Once  . gabapentin  300 mg Oral  Daily  . heparin  5,000 Units Subcutaneous Q8H  . insulin aspart  0-9 Units Subcutaneous TID WC  . insulin glargine  15 Units Subcutaneous QHS  . multivitamin  1 tablet Oral QHS     Madelon Lips, MD [facility] pgr [phone] 11/07/2016, 1:14 PM

## 2016-11-07 NOTE — Progress Notes (Signed)
Critical Lab Sodium-118 MD notified.

## 2016-11-08 ENCOUNTER — Inpatient Hospital Stay (HOSPITAL_COMMUNITY): Payer: Medicaid Other

## 2016-11-08 ENCOUNTER — Encounter (HOSPITAL_COMMUNITY)
Admission: EM | Disposition: A | Discharge status: Home / Self Care | Payer: Self-pay | Source: Home / Self Care | Attending: Family Medicine

## 2016-11-08 ENCOUNTER — Inpatient Hospital Stay (HOSPITAL_COMMUNITY): Payer: Medicaid Other | Admitting: Certified Registered"

## 2016-11-08 ENCOUNTER — Other Ambulatory Visit: Payer: Self-pay

## 2016-11-08 DIAGNOSIS — N186 End stage renal disease: Secondary | ICD-10-CM

## 2016-11-08 DIAGNOSIS — T82898A Other specified complication of vascular prosthetic devices, implants and grafts, initial encounter: Secondary | ICD-10-CM

## 2016-11-08 DIAGNOSIS — Z992 Dependence on renal dialysis: Secondary | ICD-10-CM

## 2016-11-08 HISTORY — PX: INSERTION OF DIALYSIS CATHETER: SHX1324

## 2016-11-08 LAB — HEPATITIS B SURFACE ANTIBODY,QUALITATIVE: Hep B S Ab: NONREACTIVE

## 2016-11-08 LAB — RENAL FUNCTION PANEL
ALBUMIN: 2.4 g/dL — AB (ref 3.5–5.0)
ALBUMIN: 2.4 g/dL — AB (ref 3.5–5.0)
ALBUMIN: 2.4 g/dL — AB (ref 3.5–5.0)
ANION GAP: 9 (ref 5–15)
ANION GAP: 10 (ref 5–15)
ANION GAP: 11 (ref 5–15)
Albumin: 2.4 g/dL — ABNORMAL LOW (ref 3.5–5.0)
Albumin: 2.4 g/dL — ABNORMAL LOW (ref 3.5–5.0)
Anion gap: 11 (ref 5–15)
Anion gap: 11 (ref 5–15)
BUN: 106 mg/dL — ABNORMAL HIGH (ref 6–20)
BUN: 106 mg/dL — ABNORMAL HIGH (ref 6–20)
BUN: 105 mg/dL — AB (ref 6–20)
BUN: 104 mg/dL — ABNORMAL HIGH (ref 6–20)
BUN: 106 mg/dL — AB (ref 6–20)
CALCIUM: 7 mg/dL — AB (ref 8.9–10.3)
CALCIUM: 7.1 mg/dL — AB (ref 8.9–10.3)
CHLORIDE: 88 mmol/L — AB (ref 101–111)
CHLORIDE: 91 mmol/L — AB (ref 101–111)
CHLORIDE: 89 mmol/L — AB (ref 101–111)
CO2: 20 mmol/L — ABNORMAL LOW (ref 22–32)
CO2: 19 mmol/L — ABNORMAL LOW (ref 22–32)
CO2: 20 mmol/L — ABNORMAL LOW (ref 22–32)
CO2: 20 mmol/L — AB (ref 22–32)
CO2: 19 mmol/L — ABNORMAL LOW (ref 22–32)
CREATININE: 5.49 mg/dL — AB (ref 0.61–1.24)
CREATININE: 5.4 mg/dL — AB (ref 0.61–1.24)
Calcium: 7.2 mg/dL — ABNORMAL LOW (ref 8.9–10.3)
Calcium: 7.1 mg/dL — ABNORMAL LOW (ref 8.9–10.3)
Calcium: 7.3 mg/dL — ABNORMAL LOW (ref 8.9–10.3)
Chloride: 87 mmol/L — ABNORMAL LOW (ref 101–111)
Chloride: 89 mmol/L — ABNORMAL LOW (ref 101–111)
Creatinine, Ser: 5.48 mg/dL — ABNORMAL HIGH (ref 0.61–1.24)
Creatinine, Ser: 5.25 mg/dL — ABNORMAL HIGH (ref 0.61–1.24)
Creatinine, Ser: 5.49 mg/dL — ABNORMAL HIGH (ref 0.61–1.24)
GFR calc Af Amer: 12 mL/min — ABNORMAL LOW (ref >60)
GFR calc Af Amer: 12 mL/min — ABNORMAL LOW (ref >60)
GFR calc non Af Amer: 11 mL/min — ABNORMAL LOW (ref >60)
GFR, EST AFRICAN AMERICAN: 12 mL/min — AB (ref >60)
GFR, EST AFRICAN AMERICAN: 13 mL/min — AB (ref >60)
GFR, EST AFRICAN AMERICAN: 12 mL/min — AB (ref >60)
GFR, EST NON AFRICAN AMERICAN: 10 mL/min — AB (ref >60)
GFR, EST NON AFRICAN AMERICAN: 10 mL/min — AB (ref >60)
GFR, EST NON AFRICAN AMERICAN: 11 mL/min — AB (ref >60)
GFR, EST NON AFRICAN AMERICAN: 10 mL/min — AB (ref >60)
GLUCOSE: 110 mg/dL — AB (ref 65–99)
GLUCOSE: 101 mg/dL — AB (ref 65–99)
Glucose, Bld: 70 mg/dL (ref 65–99)
Glucose, Bld: 103 mg/dL — ABNORMAL HIGH (ref 65–99)
Glucose, Bld: 136 mg/dL — ABNORMAL HIGH (ref 65–99)
PHOSPHORUS: 6.1 mg/dL — AB (ref 2.5–4.6)
PHOSPHORUS: 6.1 mg/dL — AB (ref 2.5–4.6)
POTASSIUM: 4.1 mmol/L (ref 3.5–5.1)
Phosphorus: 6.2 mg/dL — ABNORMAL HIGH (ref 2.5–4.6)
Phosphorus: 6.1 mg/dL — ABNORMAL HIGH (ref 2.5–4.6)
Phosphorus: 5.9 mg/dL — ABNORMAL HIGH (ref 2.5–4.6)
Potassium: 4.2 mmol/L (ref 3.5–5.1)
Potassium: 4.1 mmol/L (ref 3.5–5.1)
Potassium: 4 mmol/L (ref 3.5–5.1)
Potassium: 4 mmol/L (ref 3.5–5.1)
SODIUM: 116 mmol/L — AB (ref 135–145)
SODIUM: 121 mmol/L — AB (ref 135–145)
Sodium: 119 mmol/L — CL (ref 135–145)
Sodium: 119 mmol/L — CL (ref 135–145)
Sodium: 119 mmol/L — CL (ref 135–145)

## 2016-11-08 LAB — GLUCOSE, CAPILLARY
GLUCOSE-CAPILLARY: 96 mg/dL (ref 65–99)
GLUCOSE-CAPILLARY: 165 mg/dL — AB (ref 65–99)
GLUCOSE-CAPILLARY: 134 mg/dL — AB (ref 65–99)
GLUCOSE-CAPILLARY: 115 mg/dL — AB (ref 65–99)
GLUCOSE-CAPILLARY: 96 mg/dL (ref 65–99)
GLUCOSE-CAPILLARY: 125 mg/dL — AB (ref 65–99)

## 2016-11-08 LAB — TYPE AND SCREEN
ABO/RH(D): O POS
ANTIBODY SCREEN: NEGATIVE
Unit division: 0

## 2016-11-08 LAB — BPAM RBC
BLOOD PRODUCT EXPIRATION DATE: 201806282359
ISSUE DATE / TIME: 201806211344
Unit Type and Rh: 5100

## 2016-11-08 LAB — SURGICAL PCR SCREEN
MRSA, PCR: NEGATIVE
STAPHYLOCOCCUS AUREUS: NEGATIVE

## 2016-11-08 LAB — HEPATITIS B CORE ANTIBODY, TOTAL: HEP B C TOTAL AB: NEGATIVE

## 2016-11-08 LAB — HEPATITIS B SURFACE ANTIGEN: Hepatitis B Surface Ag: NEGATIVE

## 2016-11-08 SURGERY — INSERTION OF DIALYSIS CATHETER
Anesthesia: Monitor Anesthesia Care | Site: Neck | Laterality: Right

## 2016-11-08 MED ORDER — MIDAZOLAM HCL 2 MG/2ML IJ SOLN
INTRAMUSCULAR | Status: AC
  Filled 2016-11-08: qty 2

## 2016-11-08 MED ORDER — FENTANYL CITRATE (PF) 250 MCG/5ML IJ SOLN
INTRAMUSCULAR | Status: AC
  Filled 2016-11-08: qty 5

## 2016-11-08 MED ORDER — 0.9 % SODIUM CHLORIDE (POUR BTL) OPTIME
TOPICAL | Status: DC | PRN
  Administered 2016-11-08: 1000 mL

## 2016-11-08 MED ORDER — HEPARIN SODIUM (PORCINE) 1000 UNIT/ML IJ SOLN
INTRAMUSCULAR | Status: AC
  Filled 2016-11-08: qty 1

## 2016-11-08 MED ORDER — LIDOCAINE-EPINEPHRINE 1 %-1:100000 IJ SOLN
INTRAMUSCULAR | Status: AC
  Filled 2016-11-08: qty 1

## 2016-11-08 MED ORDER — NEPRO/CARBSTEADY PO LIQD
237.0000 mL | Freq: Two times a day (BID) | ORAL | Status: DC
  Administered 2016-11-09 – 2016-11-16 (×5): 237 mL via ORAL

## 2016-11-08 MED ORDER — DARBEPOETIN ALFA 60 MCG/0.3ML IJ SOSY
60.0000 ug | PREFILLED_SYRINGE | INTRAMUSCULAR | Status: DC
  Filled 2016-11-08: qty 0.3

## 2016-11-08 MED ORDER — CEFAZOLIN SODIUM-DEXTROSE 1-4 GM/50ML-% IV SOLN
INTRAVENOUS | Status: AC
Start: 2016-11-08 — End: 2016-11-08
  Filled 2016-11-08: qty 50

## 2016-11-08 MED ORDER — SODIUM CHLORIDE 1 G PO TABS
2.0000 g | ORAL_TABLET | Freq: Once | ORAL | Status: AC
  Administered 2016-11-08: 2 g via ORAL
  Filled 2016-11-08: qty 2

## 2016-11-08 MED ORDER — ATORVASTATIN CALCIUM 20 MG PO TABS: ORAL_TABLET | ORAL | 3 refills | Status: DC

## 2016-11-08 MED ORDER — METOCLOPRAMIDE HCL 5 MG/ML IJ SOLN: 10.0000 mg | Freq: Once | INTRAMUSCULAR | Status: DC | PRN

## 2016-11-08 MED ORDER — FENTANYL CITRATE (PF) 100 MCG/2ML IJ SOLN: 25.0000 ug | INTRAMUSCULAR | Status: DC | PRN

## 2016-11-08 MED ORDER — SODIUM CHLORIDE 0.9 % IV SOLN
INTRAVENOUS | Status: DC | PRN
  Administered 2016-11-08: 16:00:00 500 mL

## 2016-11-08 MED ORDER — PROPOFOL 10 MG/ML IV BOLUS
INTRAVENOUS | Status: AC
  Filled 2016-11-08: qty 20

## 2016-11-08 MED ORDER — FENTANYL CITRATE (PF) 250 MCG/5ML IJ SOLN
INTRAMUSCULAR | Status: DC | PRN
  Administered 2016-11-08: 25 ug via INTRAVENOUS

## 2016-11-08 MED ORDER — HEPARIN SODIUM (PORCINE) 1000 UNIT/ML IJ SOLN
INTRAMUSCULAR | Status: DC | PRN
  Administered 2016-11-08: 1000 [IU] via INTRAVENOUS

## 2016-11-08 MED ORDER — CEFAZOLIN SODIUM-DEXTROSE 1-4 GM/50ML-% IV SOLN
1.0000 g | Freq: Once | INTRAVENOUS | Status: AC
  Administered 2016-11-08: 1 g via INTRAVENOUS

## 2016-11-08 MED ORDER — LIDOCAINE-EPINEPHRINE 1 %-1:100000 IJ SOLN
INTRAMUSCULAR | Status: DC | PRN
  Administered 2016-11-08: 8 mL

## 2016-11-08 MED ORDER — LACTATED RINGERS IV SOLN: INTRAVENOUS | Status: DC

## 2016-11-08 MED ORDER — LIDOCAINE HCL (PF) 1 % IJ SOLN
INTRAMUSCULAR | Status: AC
  Filled 2016-11-08: qty 30

## 2016-11-08 MED ORDER — SODIUM CHLORIDE 0.9 % IV SOLN
125.0000 mg | Freq: Once | INTRAVENOUS | Status: AC
  Administered 2016-11-09: 125 mg via INTRAVENOUS
  Filled 2016-11-08 (×2): qty 10

## 2016-11-08 MED ORDER — MIDAZOLAM HCL 5 MG/5ML IJ SOLN
INTRAMUSCULAR | Status: DC | PRN
  Administered 2016-11-08: 1 mg via INTRAVENOUS

## 2016-11-08 SURGICAL SUPPLY — 37 items
ADH SKN CLS APL DERMABOND .7 (GAUZE/BANDAGES/DRESSINGS) ×1
BAG DECANTER FOR FLEXI CONT (MISCELLANEOUS) ×3 IMPLANT
BIOPATCH RED 1 DISK 7.0 (GAUZE/BANDAGES/DRESSINGS) ×2 IMPLANT
BIOPATCH RED 1IN DISK 7.0MM (GAUZE/BANDAGES/DRESSINGS) ×1
CATH PALINDROME RT-P 15FX19CM (CATHETERS) IMPLANT
CATH PALINDROME RT-P 15FX23CM (CATHETERS) ×3 IMPLANT
CATH PALINDROME RT-P 15FX28CM (CATHETERS) IMPLANT
CATH PALINDROME RT-P 15FX55CM (CATHETERS) IMPLANT
COVER PROBE W GEL 5X96 (DRAPES) ×3 IMPLANT
COVER SURGICAL LIGHT HANDLE (MISCELLANEOUS) ×3 IMPLANT
DECANTER SPIKE VIAL GLASS SM (MISCELLANEOUS) ×3 IMPLANT
DERMABOND ADVANCED (GAUZE/BANDAGES/DRESSINGS) ×2
DERMABOND ADVANCED .7 DNX12 (GAUZE/BANDAGES/DRESSINGS) ×1 IMPLANT
DRAPE C-ARM 42X72 X-RAY (DRAPES) ×3 IMPLANT
DRAPE CHEST BREAST 15X10 FENES (DRAPES) ×3 IMPLANT
GLOVE SS BIOGEL STRL SZ 7.5 (GLOVE) ×1 IMPLANT
GLOVE SUPERSENSE BIOGEL SZ 7.5 (GLOVE) ×2
GOWN STRL REUS W/ TWL LRG LVL3 (GOWN DISPOSABLE) ×2 IMPLANT
GOWN STRL REUS W/TWL LRG LVL3 (GOWN DISPOSABLE) ×4
KIT BASIN OR (CUSTOM PROCEDURE TRAY) ×3 IMPLANT
KIT ROOM TURNOVER OR (KITS) ×3 IMPLANT
NEEDLE 18GX1X1/2 (RX/OR ONLY) (NEEDLE) ×3 IMPLANT
NEEDLE 22X1 1/2 (OR ONLY) (NEEDLE) IMPLANT
NEEDLE HYPO 25GX1X1/2 BEV (NEEDLE) ×3 IMPLANT
NS IRRIG 1000ML POUR BTL (IV SOLUTION) ×3 IMPLANT
PACK SURGICAL SETUP 50X90 (CUSTOM PROCEDURE TRAY) ×3 IMPLANT
PAD ARMBOARD 7.5X6 YLW CONV (MISCELLANEOUS) ×6 IMPLANT
SOAP 2 % CHG 4 OZ (WOUND CARE) ×3 IMPLANT
SUT ETHILON 3 0 PS 1 (SUTURE) ×3 IMPLANT
SUT VICRYL 4-0 PS2 18IN ABS (SUTURE) ×3 IMPLANT
SYR 10ML LL (SYRINGE) ×3 IMPLANT
SYR 20CC LL (SYRINGE) ×3 IMPLANT
SYR 5ML LL (SYRINGE) ×6 IMPLANT
SYR CONTROL 10ML LL (SYRINGE) ×3 IMPLANT
TOWEL OR 17X24 6PK STRL BLUE (TOWEL DISPOSABLE) ×3 IMPLANT
TOWEL OR 17X26 10 PK STRL BLUE (TOWEL DISPOSABLE) ×3 IMPLANT
WATER STERILE IRR 1000ML POUR (IV SOLUTION) ×3 IMPLANT

## 2016-11-08 NOTE — Progress Notes (Signed)
CRITICAL VALUE ALERT  Critical Value:  Na+ 119  Date & Time Notied:  11/08/16 09:42  Provider Notified: Yes  Orders Received/Actions taken:

## 2016-11-08 NOTE — Progress Notes (Signed)
Report called to [facility] in short stay.  All questions answered.

## 2016-11-08 NOTE — Anesthesia Preprocedure Evaluation (Signed)
Anesthesia Evaluation  Patient identified by MRN, date of birth, ID band Patient awake    Reviewed: Allergy & Precautions, NPO status , Patient's Chart, lab work & pertinent test results  Airway Mallampati: III  TM Distance: >3 FB Neck ROM: Full    Dental no notable dental hx.    Pulmonary former smoker,    Pulmonary exam normal breath sounds clear to auscultation       Cardiovascular hypertension, Pt. on medications + CAD and + Peripheral Vascular Disease  Normal cardiovascular exam Rhythm:Regular Rate:Normal     Neuro/Psych CVA, No Residual Symptoms negative psych ROS   GI/Hepatic negative GI ROS, Neg liver ROS,   Endo/Other  diabetes, Insulin Dependent  Renal/GU ESRF and DialysisRenal disease  negative genitourinary   Musculoskeletal negative musculoskeletal ROS (+)   Abdominal   Peds negative pediatric ROS (+)  Hematology negative hematology ROS (+) anemia , Hgb 7.6 Plt 141   Anesthesia Other Findings   Reproductive/Obstetrics negative OB ROS                             Anesthesia Physical Anesthesia Plan  ASA: IV  Anesthesia Plan: MAC   Post-op Pain Management:    Induction:   PONV Risk Score and Plan:   Airway Management Planned: Simple Face Mask  Additional Equipment:   Intra-op Plan:   Post-operative Plan:   Informed Consent: I have reviewed the patients History and Physical, chart, labs and discussed the procedure including the risks, benefits and alternatives for the proposed anesthesia with the patient or authorized representative who has indicated his/her understanding and acceptance.   Dental advisory given  Plan Discussed with:   Anesthesia Plan Comments:        Anesthesia Quick Evaluation

## 2016-11-08 NOTE — Transfer of Care (Signed)
Immediate Anesthesia Transfer of Care Note  Patient: Russell Price  Procedure(s) Performed: Procedure(s): INSERTION OF DIALYSIS CATHETER (Right)  Patient Location: PACU  Anesthesia Type:MAC  Level of Consciousness: awake, alert  and oriented  Airway & Oxygen Therapy: Patient Spontanous Breathing  Post-op Assessment: Report given to RN and Post -op Vital signs reviewed and stable  Post vital signs: Reviewed and stable  Last Vitals:  Vitals:   11/08/16 0900 11/08/16 1424  BP: (!) 152/66   Pulse: 65 61  Resp: 18 16  Temp: 36.5 C 36.7 C    Last Pain:  Vitals:   11/08/16 1424  TempSrc: Oral  PainSc:          Complications: No apparent anesthesia complications

## 2016-11-08 NOTE — Progress Notes (Signed)
Critical Lab Sodium-116 MD notified.

## 2016-11-08 NOTE — Clinical Social Work Note (Addendum)
CSW talked with patient's Medicaid worker, [place] [phone]), on 11/07/16. CSW informed by case worker that patient's Medicaid application is still pending and that he has a deductible of over $5,000 (she is not allowed to give the exact information), and she has not received any medical bills from patient. Per Ms. Murchison, patient does not have Medicare. CSW advised that transportation form was mailed to patient. [name] informed CSW that she will look over patient's case more and call back. [name] in dialysis informed that patient's Medicaid is still pending.  11/08/16 - Call made to wife, Marcine Matar [phone]) and message left requesting she call CSW.   Delshawn Stech Givens, MSW, LCSW Licensed Clinical Social Worker [place] [phone]

## 2016-11-08 NOTE — Progress Notes (Signed)
Attempted to call report to short stay. RN on another call.

## 2016-11-08 NOTE — Progress Notes (Signed)
PROGRESS NOTE    Russell Price  WFU:[id number] DOB: 02-Nov-1960 DOA: 11/06/2016 PCP: Arnoldo Morale, MD  Outpatient Specialists:     Brief Narrative:  56 y/o ? 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 CVA Hyperlipidemia  Admitted early morning 6/20 severe hyponatremia 115, usual baseline 132, hyperkalemia 5.2 ? Volume overload secondary to oliguric renal failure Nephrology consulted   Assessment & Plan:   Principal Problem:   Hyponatremia Active Problems:   Essential hypertension   DM (diabetes mellitus) with complications (HCC)   Anemia of chronic disease   ESRD (end stage renal disease) (HCC)   Fluid overload   Psychogenic polydipsia-admission sodium 115, baseline 09/02/16 was 132 CK D5--->ESRD declared this admission Renal acidosis L AV fistula placement 05/14/2016--currently non working-Await [facility] input re: [facility] 6/22 Secondary hyperparathyroidism  hypervolemic hyponatremia clinically secondary to volume overload and oliguria, sodium slowly ? 119  First episode of dialysis 6/21, unable to dialyze 6/22 secondary to access issues  Phosphorous binder, calcium carbonate and dialysate bath with micronutrient replacement as per nephrology  Continue strict I/O for now  Dispo and CLIP as yet unclear  Anemia, microcytic External thrombosed hemorrhoid 6:00, 4:00  Patient has never had a colonoscopy,  hemoccult neg  Ferritin 1,440, Tsat low, ? RES blockade vs Functional iron def--Get CRP  nulecit x 1 given 6/22  CAD-microvascular disease based on cath 1016 2025 Chronic diastolic heart failure without acute component, EF 42% htn with complication of hypertensive glomerulosclerosis  Management will now be via dialysis  Cont for now Lasix 160 twice a day PTA, holding chlorthalidone 25 daily  Dosing of Coreg 12.5 bid per nephrologist, he has not been taking Procardia XL 90 mg for CAD  Diabetic neuropathy, nephropathy type II since last 20 years  Continue Lantus 15 units  Continue sliding scale coverage-sugars 96--165  Blood sugars reasonable below 150  Continue gabapentin 300 daily for neuropathy   Hyperlipidemia  continue Lipitor  Moderate malnutrition  Consider supplements with NePro, protein level is less than 3   Full CODE STATUS SCD Inpatient Disposition unclear-I've discussed with social work regarding clip  TDC will be needed and clarification of vascular access long term prior to d/c require coordination with bus service prior to discharge    Consultants:   Nephrology  Procedures:   Dialysis  Antimicrobials:   None    Subjective:  Feels less swollen Many questions about dialysis and timing of d/c and attempted to answer  No cp  No sob No fever   Objective: Vitals:   11/07/16 1650 11/07/16 2131 11/08/16 0500 11/08/16 0900  BP: (!) 159/65 133/78 133/62 (!) 152/66  Pulse: 62 62 62 65  Resp: 16 18 17 18   Temp: 98.2 F (  36.8 C) 98.7 F (37.1 C) 98.2 F (36.8 C) 97.7 F (36.5 C)  TempSrc: Oral Oral Oral Oral  SpO2: 100% 100% 100% 100%  Weight:      Height:        Intake/Output Summary (Last 24 hours) at 11/08/16 1258 Last data filed at 11/08/16 0956  Gross per 24 hour  Intake              704 ml  Output              775 ml  Net              -71 ml   Filed Weights   11/06/16 2113 11/07/16 0700 11/07/16 0842  Weight: 87.1 kg (192 lb) 87.9  kg (193 lb 12.6 oz) 87.6 kg (193 lb 2 oz)    Examination:  face lessswollen  Scleral changes  JVD flatter S1 and S2 no murmur rub or gallop distended with mild volume Pedis and scrotumnot eval today Skin swollen as above no rash   Data Reviewed: I have personally reviewed following labs and imaging studies  CBC:  Recent Labs Lab 11/05/16 2250 11/06/16 1639 11/07/16 0241  WBC 4.0 3.7* 3.7*  NEUTROABS 2.7  --  2.3  HGB 8.3* 8.4* 7.6*  HCT 24.1* 24.0* 22.0*  MCV 80.1 78.4 77.7*  PLT 171 146* 161*   Basic Metabolic Panel:  Recent Labs Lab 11/07/16 0740 11/07/16 1835 11/07/16 2255 11/08/16 0229 11/08/16 0848  NA 119* 118* 116* 119* 119*  K 3.9 4.4 4.2 4.1 4.0  CL 89* 87* 87* 89* 88*  CO2 19* 22 20* 19* 20*  GLUCOSE 106* 135* 110* 70 101*  BUN 105* 105* 106* 106* 105*  CREATININE 5.18* 5.35* 5.48* 5.49* 5.40*  CALCIUM 7.0* 7.0* 7.0* 7.2* 7.1*  PHOS 5.6* 6.1* 6.1* 6.2* 6.1*   GFR: Estimated Creatinine Clearance: 15.9 mL/min (A) (by C-G formula based on SCr of 5.4 mg/dL (H)). Liver Function Tests:  Recent Labs Lab 11/05/16 2250  11/07/16 0740 11/07/16 1835 11/07/16 2255 11/08/16 0229 11/08/16 0848  AST 23  --   --   --   --   --   --   ALT 55  --   --   --   --   --   --   ALKPHOS 401*  --   --   --   --   --   --   BILITOT 1.0  --   --   --   --   --   --   PROT 6.1*  --   --   --   --   --   --   ALBUMIN 2.6*  < > 2.3* 2.4* 2.4* 2.4* 2.4*  < > = values in this interval not displayed. No results for input(s): LIPASE, AMYLASE in the last 168 hours. No results for input(s): AMMONIA in the last 168 hours. Coagulation Profile: No results for input(s): INR, PROTIME in the last 168 hours. Cardiac Enzymes: No results for input(s): CKTOTAL, CKMB, CKMBINDEX, TROPONINI in the last 168 hours. BNP (last 3 results) No results for input(s): PROBNP in the last 8760 hours. HbA1C: No results for input(s): HGBA1C in the last 72 hours. CBG:  Recent Labs Lab  11/07/16 1137 11/07/16 1627 11/07/16 2134 11/08/16 0734 11/08/16 1131  GLUCAP 158* 142* 116* 96 165*   Lipid Profile: No results for input(s): CHOL, HDL, LDLCALC, TRIG, CHOLHDL, LDLDIRECT in the last 72 hours. Thyroid Function  Tests: No results for input(s): TSH, T4TOTAL, FREET4, T3FREE, THYROIDAB in the last 72 hours. Anemia Panel:  Recent Labs  11/07/16 1056  VITAMINB12 1,161*  FOLATE 21.5  FERRITIN 1,461*  TIBC 186*  IRON 81  RETICCTPCT 1.8   Urine analysis:    Component Value Date/Time   COLORURINE YELLOW 11/06/2016 0228   APPEARANCEUR CLEAR 11/06/2016 0228   LABSPEC 1.006 11/06/2016 0228   PHURINE 5.0 11/06/2016 0228   GLUCOSEU NEGATIVE 11/06/2016 0228   HGBUR MODERATE (A) 11/06/2016 0228   BILIRUBINUR NEGATIVE 11/06/2016 0228   BILIRUBINUR negative 02/27/2015 1458   BILIRUBINUR neg 02/22/2013 1737   KETONESUR NEGATIVE 11/06/2016 0228   PROTEINUR 30 (A) 11/06/2016 0228   UROBILINOGEN 0.2 02/28/2015 0032   NITRITE NEGATIVE 11/06/2016 0228   LEUKOCYTESUR NEGATIVE 11/06/2016 0228   Sepsis Labs: @LABRCNTIP (procalcitonin:4,lacticidven:4)  ) Recent Results (from the past 240 hour(s))  MRSA PCR Screening     Status: None   Collection Time: 11/07/16 10:15 AM  Result Value Ref Range Status   MRSA by PCR NEGATIVE NEGATIVE Final    Comment:        The GeneXpert MRSA Assay (FDA approved for NASAL specimens only), is one component of a comprehensive MRSA colonization surveillance program. It is not intended to diagnose MRSA infection nor to guide or monitor treatment for MRSA infections.       Radiology Studies: No results found.   Scheduled Meds: . aspirin  81 mg Oral Daily  . atorvastatin  40 mg Oral q1800  . calcitRIOL  0.25 mcg Oral Daily  . carvedilol  12.5 mg Oral BID WC  . darbepoetin (ARANESP) injection - DIALYSIS  60 mcg Intravenous Q Fri-HD  . feeding supplement (NEPRO CARB STEADY)  237 mL Oral BID BM  . gabapentin  300 mg Oral Daily  .  heparin  5,000 Units Subcutaneous Q8H  . insulin aspart  0-9 Units Subcutaneous TID WC  . insulin glargine  15 Units Subcutaneous QHS  . multivitamin  1 tablet Oral QHS   Continuous Infusions: . sodium chloride    . sodium chloride       LOS: 2 days    Time spent: [place], MD [facility]   If 7PM-7AM, please contact night-coverage [email or web address] Password [facility] 11/08/2016, 12:58 PM

## 2016-11-08 NOTE — Op Note (Signed)
    OPERATIVE REPORT  DATE OF SURGERY: 11/08/2016  PATIENT: Russell Price, 56 y.o. male MRN: [id number]  DOB: [birth year]  PRE-OPERATIVE DIAGNOSIS: End-stage renal disease  POST-OPERATIVE DIAGNOSIS:  Same  PROCEDURE: Right IJ hemodialysis catheter with SonoSite visualization  SURGEON:  Curt Jews, M.D.  PHYSICIAN ASSISTANT: Nurse  ANESTHESIA:  1% lidocaine with IV sedation  EBL: Minimal ml  Total I/O In: 220 [P.O.:120; I.V.:100] Out: 10 [Blood:10]  BLOOD ADMINISTERED: None  DRAINS: None  SPECIMEN: None  COUNTS CORRECT:  YES  PLAN OF CARE: PACU with chest x-ray pending   PATIENT DISPOSITION:  PACU - hemodynamically stable  PROCEDURE DETAILS: Patient was taken to the operating placed supine position where the area the right left neck and chest prepped and draped in usual sterile fashion. Using SonoSite visualization the right internal jugular vein was accessed with an 18-gauge needle. Guidewire was passed over the level right atrium and this was confirmed with fluoroscopy. A dilator and peel-away sheath was passed over the guidewire and the dilator and guidewire were removed. 23 cm hemodialysis catheter was passed through the peel-away sheath which was removed. The tips of the catheter replaced in the distal right atrium. The catheter was brought through subcutaneous tunnel through a separate stab incision. 2 lm ports were attached in both lumens flushed and aspirated easily with saline. The catheter was locked with 1001st unit cc heparin. The catheter secured to the skin 3-0 nylon stitch and the entry site was closed with a 4 septic or Vicryl stitch. Sterile dressing was applied the patient was transferred to the recovery room stable condition   Rosetta Posner, M.D., [facility] 11/08/2016 4:05 PM

## 2016-11-08 NOTE — Progress Notes (Signed)
[facility] Progress Note   Subjective:  Seen in room. Brought from HD this morning, but unable to cannulate arm. Was dialyzed yesterday but had significant infiltration. No CP or dyspnea. Making urine and sodium is increasing slowly - 119 today   Objective Vitals:   11/07/16 1650 11/07/16 2131 11/08/16 0500 11/08/16 0900  BP: (!) 159/65 133/78 133/62 (!) 152/66  Pulse: 62 62 62 65  Resp: 16 18 17 18   Temp: 98.2 F (36.8 C) 98.7 F (37.1 C) 98.2 F (36.8 C) 97.7 F (36.5 C)  TempSrc: Oral Oral Oral Oral  SpO2: 100% 100% 100% 100%  Weight:      Height:       Physical Exam General: Well appearing male. Lying on back without dyspnea/NAD. Heart: RRR; no murmur. Lungs: CTA in upper lobes Abdomen: Tense/distended. No tenderness. Extremities: 2+ pitting LE edema. Dialysis Access: LUE AVF + edematous/ice pack in place.  Additional Objective Labs: Basic Metabolic Panel:  Recent Labs Lab 11/07/16 2255 11/08/16 0229 11/08/16 0848  NA 116* 119* 119*  K 4.2 4.1 4.0  CL 87* 89* 88*  CO2 20* 19* 20*  GLUCOSE 110* 70 101*  BUN 106* 106* 105*  CREATININE 5.48* 5.49* 5.40*  CALCIUM 7.0* 7.2* 7.1*  PHOS 6.1* 6.2* 6.1*   Liver Function Tests:  Recent Labs Lab 11/05/16 2250  11/07/16 2255 11/08/16 0229 11/08/16 0848  AST 23  --   --   --   --   ALT 55  --   --   --   --   ALKPHOS 401*  --   --   --   --   BILITOT 1.0  --   --   --   --   PROT 6.1*  --   --   --   --   ALBUMIN 2.6*  < > 2.4* 2.4* 2.4*  < > = values in this interval not displayed. CBC:  Recent Labs Lab 11/05/16 2250 11/06/16 1639 11/07/16 0241  WBC 4.0 3.7* 3.7*  NEUTROABS 2.7  --  2.3  HGB 8.3* 8.4* 7.6*  HCT 24.1* 24.0* 22.0*  MCV 80.1 78.4 77.7*  PLT 171 146* 141*   CBG:  Recent Labs Lab 11/07/16 0958 11/07/16 1137 11/07/16 1627 11/07/16 2134 11/08/16 0734  GLUCAP 120* 158* 142* 116* 96   Iron Studies:  Recent Labs  11/07/16 1056  IRON 81  TIBC 186*  FERRITIN  1,461*   Medications: . sodium chloride    . sodium chloride     . aspirin  81 mg Oral Daily  . atorvastatin  40 mg Oral q1800  . calcitRIOL  0.25 mcg Oral Daily  . carvedilol  12.5 mg Oral BID WC  . gabapentin  300 mg Oral Daily  . heparin  5,000 Units Subcutaneous Q8H  . insulin aspart  0-9 Units Subcutaneous TID WC  . insulin glargine  15 Units Subcutaneous QHS  . multivitamin  1 tablet Oral QHS    Dialysis Orders: establishing.  Assessment/Plan: 1. Hyponatremia: Likely d/t polydipsia and decreased free water excretion due to CKD. Continue fluid restriction. Off lasix at this time since HD started. Only gave 20 of lasix ?? But made 1100 of urine so yes will continue no lasix as I hope we will be able to get HD done tomorrow at the latest 2. CKD V -> ESRD: S/p HD#1 on 6/21, but + infiltration. Unable to be cannulated today. VVS consulted, will see patient today and likely  place catheter, and then plan to dialyze afterwards (either tonight or tomorrow morning). Hopefully CLIP in process - pt will need transportation 3. HTN/volume: BP high with significant volume excess; continue UF with dialysis when able. 4. Anemia: Hgb 7.6. S/p IV iron and 1U PRBCs on 6/21. Start Aranesp 33mcg today. 5. Secondary hyperparathyroidism: Corrected Ca ok. Phos high, but has only been dialyzed once. Follow after a couple HD and then start binder if still high. Continue calcitriol daily for now. 6. Nutrition: Alb 2.4. Start Nepro as supplement. 7. Type 2 DM: On insulin, per primary. 8. CAD: No current symptoms. On Coreg/atorvastatin.  Veneta Penton, PA-C 11/08/2016, 10:17 AM  [facility] Pager: [phone]  Patient seen and examined, agree with above note with above modifications. Patient presented with uremia and hyponatremia needing to initiate dialysis.  Unfortunately have not been able to use AVF- have called  VVS , will probably need catheter until AVF is usable - rest vs  intervention.  Sodium better  Corliss Parish, MD 11/08/2016

## 2016-11-08 NOTE — Progress Notes (Signed)
Problems with access this AM, during dialysis treatment. Bruit and thrill felt with no c/o pain. Arm swolllen around venous site  And small infiltrate with arterial. Dr. Hollie Salk aware, order for catheter placememnt ordered

## 2016-11-08 NOTE — Progress Notes (Signed)
Per Melissa in hemodialysis patient will be scheduled first thing in am for hemodialysis and Iron & arnesp will be given then.

## 2016-11-08 NOTE — Anesthesia Procedure Notes (Signed)
Procedure Name: MAC Date/Time: 11/08/2016 3:18 PM Performed by: Teressa Lower Oxygen Delivery Method: Simple face mask

## 2016-11-08 NOTE — Consult Note (Signed)
.   [facility]  Patient name: Russell Price MRN: [id number] DOB: 1961/02/08 Sex: male  REASON FOR CONSULT: Unable to cannulate left arm fistula, consult is from Dr. Moshe Cipro.   HPI: Russell Price is a 56 y.o. male, who is s/p left basilic vein transposition by Dr. Donzetta Matters on 03/12/16. Had second stage procedure on 05/14/16. Patient presented to [facility] due to abnormal labs including hyponatremia. He also complained of nausea and decreased appetite. Nephrology evaluated patient and felt he was uremic and initiated HD yesterday. He completed his dialysis yesterday via his fistula but experienced an infiltration 1.5 hours into the session. Another attempt was made at cannulating his fistula today, but was unsuccessful.   Pat medical history includes DM, CAD, HTN and CVA.   Past Medical History:  Diagnosis Date  . CAD (coronary artery disease)    a. underwent cath in 02/2016 after an abnormal nuc which showed 2V CAD in RCA and OM3 with no good targets for PCI. Medical managment recommended.   . Chronic kidney disease   . Diabetes mellitus without complication ([place])    followed by Dr Chalmers Cater  . ESRD (end stage renal disease) ([place])   . Glaucoma   . Hypertension    followed by [facility]  . Stroke [facility])     Family History  Problem Relation Age of Onset  . Diabetes Mother     SOCIAL HISTORY: Social History   Social History  . Marital status: Single    Spouse name: N/A  . Number of children: N/A  . Years of education: N/A   Occupational History  . Not on file.   Social History Main Topics  . Smoking status: Former Smoker    Packs/day: 0.00    Years: 4.00    Quit date: 02/23/1983  . Smokeless tobacco: Never Used  . Alcohol use No  . Drug use: No  . Sexual activity: Not on file   Other Topics Concern  . Not on file   Social History Narrative  . No narrative on file    Allergies  Allergen Reactions  . No  Known Allergies     MEDICATIONS:  Current Facility-Administered Medications  Medication Dose Route Frequency Provider Last Rate Last Dose  . 0.9 %  sodium chloride infusion  100 mL Intravenous PRN Madelon Lips, MD      . 0.9 %  sodium chloride infusion  100 mL Intravenous PRN Madelon Lips, MD      . acetaminophen (TYLENOL) tablet 650 mg  650 mg Oral Q6H PRN Norval Morton, MD       Or  . acetaminophen (TYLENOL) suppository 650 mg  650 mg Rectal Q6H PRN Fuller Plan A, MD      . alteplase (CATHFLO ACTIVASE) injection 2 mg  2 mg Intracatheter Once PRN Madelon Lips, MD      . aspirin chewable tablet 81 mg  81 mg Oral Daily Smith, Rondell A, MD   81 mg at 11/08/16 1056  . atorvastatin (LIPITOR) tablet 40 mg  40 mg Oral q1800 Fuller Plan A, MD   40 mg at 11/07/16 1736  . calcitRIOL (ROCALTROL) capsule 0.25 mcg  0.25 mcg Oral Daily Tamala Julian, Rondell A, MD   0.25 mcg at 11/08/16 1056  . carvedilol (COREG) tablet 12.5 mg  12.5 mg Oral BID WC Smith, Rondell A, MD   12.5 mg at 11/08/16 0800  . Darbepoetin Alfa (ARANESP) injection 60 mcg  60 mcg Intravenous Q Fri-HD Stovall, Kathryn R, PA-C      . feeding supplement (NEPRO CARB STEADY) liquid 237 mL  237 mL Oral BID BM Loren Racer, PA-C      . ferric gluconate (NULECIT) 125 mg in sodium chloride 0.9 % 100 mL IVPB  125 mg Intravenous Once Nita Sells, MD      . gabapentin (NEURONTIN) capsule 300 mg  300 mg Oral Daily Tamala Julian, Rondell A, MD   300 mg at 11/08/16 1056  . heparin injection 1,000 Units  1,000 Units Dialysis PRN Madelon Lips, MD      . heparin injection 5,000 Units  5,000 Units Subcutaneous Q8H Fuller Plan A, MD   5,000 Units at 11/08/16 [phone]  . insulin aspart (novoLOG) injection 0-9 Units  0-9 Units Subcutaneous TID WC Fuller Plan A, MD   2 Units at 11/08/16 1259  . insulin glargine (LANTUS) injection 15 Units  15 Units Subcutaneous QHS Norval Morton, MD   15 Units at 11/07/16 2216  .  ipratropium-albuterol (DUONEB) 0.5-2.5 (3) MG/3ML nebulizer solution 3 mL  3 mL Nebulization Q4H PRN Smith, Rondell A, MD      . lidocaine (PF) (XYLOCAINE) 1 % injection 5 mL  5 mL Intradermal PRN Madelon Lips, MD      . lidocaine-prilocaine (EMLA) cream 1 application  1 application Topical PRN Madelon Lips, MD      . multivitamin (RENA-VIT) tablet 1 tablet  1 tablet Oral QHS Norval Morton, MD   1 tablet at 11/07/16 2216  . ondansetron (ZOFRAN) tablet 4 mg  4 mg Oral Q6H PRN Norval Morton, MD       Or  . ondansetron (ZOFRAN) injection 4 mg  4 mg Intravenous Q6H PRN Norval Morton, MD      . pentafluoroprop-tetrafluoroeth (GEBAUERS) aerosol 1 application  1 application Topical PRN Madelon Lips, MD        REVIEW OF SYSTEMS:  [X]  denotes positive finding, [ ]  denotes negative finding Cardiac  Comments:  Chest pain or chest pressure:    Shortness of breath upon exertion:    Short of breath when lying flat:    Irregular heart rhythm:        Vascular    Pain in calf, thigh, or hip brought on by ambulation:    Pain in feet at night that wakes you up from your sleep:     Blood clot in your veins:    Leg swelling:         Pulmonary    Oxygen at home:    Productive cough:     Wheezing:         Neurologic    Sudden weakness in arms or legs:     Sudden numbness in arms or legs:     Sudden onset of difficulty speaking or slurred speech:    Temporary loss of vision in one eye:     Problems with dizziness:         Gastrointestinal    Blood in stool:     Vomited blood:         Genitourinary    Burning when urinating:     Blood in urine:        Psychiatric    Major depression:         Hematologic    Bleeding problems:    Problems with blood clotting too easily:        Skin  Rashes or ulcers:        Constitutional    Fever or chills:      PHYSICAL EXAM: Vitals:   11/07/16 1650 11/07/16 2131 11/08/16 0500 11/08/16 0900  BP: (!) 159/65 133/78 133/62 (!)  152/66  Pulse: 62 62 62 65  Resp: 16 18 17 18   Temp: 98.2 F (36.8 C) 98.7 F (37.1 C) 98.2 F (36.8 C) 97.7 F (36.5 C)  TempSrc: Oral Oral Oral Oral  SpO2: 100% 100% 100% 100%  Weight:      Height:        GENERAL: The patient is a well-nourished male, in no acute distress. The vital signs are documented above. HEENT: normocephalic, atraumatic, no abnormalities noted.  CARDIAC: There is a regular rate and rhythm. No carotid bruits.  VASCULAR: Left upper arm fistula with pulsatile thrill. Upper arm is swollen from infiltration. Left hand is warm.  PULMONARY: Normal respiratory effort. Clear bilaterally.  MUSCULOSKELETAL: There are no major deformities or cyanosis. NEUROLOGIC: No focal weakness or paresthesias are detected. SKIN: There are no ulcers or rashes noted. PSYCHIATRIC: The patient has a normal affect.  MEDICAL ISSUES: Difficult cannulation left arm basilic vein transposition  Patient started HD yesterday for first time via fistula but experienced significant infiltration. Unable to cannulate today. Need to rest fistula. Will plan for [facility] today. Patient has been NPO since 0700. Obtain consent.   Fistula with pulsatile thrill. Will need fistulogram as outpatient.   Virgina Jock, PA-C [facility] [place] [phone]    I have independently interviewed patient and agree with PA assessment and plan above. TDC today and will need fistulogram later.   Zyara Riling C. Donzetta Matters, MD [facility] [place] Office: [phone] Pager: [phone]

## 2016-11-08 NOTE — Progress Notes (Signed)
Critical Lab Sodium-119 MD made aware.

## 2016-11-09 LAB — RENAL FUNCTION PANEL
ALBUMIN: 2.3 g/dL — AB (ref 3.5–5.0)
ANION GAP: 11 (ref 5–15)
ANION GAP: 11 (ref 5–15)
Albumin: 2.3 g/dL — ABNORMAL LOW (ref 3.5–5.0)
Albumin: 2.4 g/dL — ABNORMAL LOW (ref 3.5–5.0)
Albumin: 2.5 g/dL — ABNORMAL LOW (ref 3.5–5.0)
Anion gap: 10 (ref 5–15)
Anion gap: 9 (ref 5–15)
BUN: 107 mg/dL — AB (ref 6–20)
BUN: 107 mg/dL — AB (ref 6–20)
BUN: 69 mg/dL — ABNORMAL HIGH (ref 6–20)
BUN: 72 mg/dL — AB (ref 6–20)
CALCIUM: 7.7 mg/dL — AB (ref 8.9–10.3)
CHLORIDE: 88 mmol/L — AB (ref 101–111)
CHLORIDE: 90 mmol/L — AB (ref 101–111)
CHLORIDE: 93 mmol/L — AB (ref 101–111)
CO2: 20 mmol/L — ABNORMAL LOW (ref 22–32)
CO2: 20 mmol/L — AB (ref 22–32)
CO2: 22 mmol/L (ref 22–32)
CO2: 22 mmol/L (ref 22–32)
CREATININE: 5.73 mg/dL — AB (ref 0.61–1.24)
Calcium: 7.3 mg/dL — ABNORMAL LOW (ref 8.9–10.3)
Calcium: 7.4 mg/dL — ABNORMAL LOW (ref 8.9–10.3)
Calcium: 7.4 mg/dL — ABNORMAL LOW (ref 8.9–10.3)
Chloride: 91 mmol/L — ABNORMAL LOW (ref 101–111)
Creatinine, Ser: 5.63 mg/dL — ABNORMAL HIGH (ref 0.61–1.24)
Creatinine, Ser: 4.22 mg/dL — ABNORMAL HIGH (ref 0.61–1.24)
Creatinine, Ser: 4.49 mg/dL — ABNORMAL HIGH (ref 0.61–1.24)
GFR calc Af Amer: 12 mL/min — ABNORMAL LOW (ref >60)
GFR calc Af Amer: 11 mL/min — ABNORMAL LOW (ref >60)
GFR calc non Af Amer: 10 mL/min — ABNORMAL LOW (ref >60)
GFR calc non Af Amer: 14 mL/min — ABNORMAL LOW (ref >60)
GFR, EST AFRICAN AMERICAN: 16 mL/min — AB (ref >60)
GFR, EST AFRICAN AMERICAN: 15 mL/min — AB (ref >60)
GFR, EST NON AFRICAN AMERICAN: 10 mL/min — AB (ref >60)
GFR, EST NON AFRICAN AMERICAN: 13 mL/min — AB (ref >60)
GLUCOSE: 90 mg/dL (ref 65–99)
GLUCOSE: 118 mg/dL — AB (ref 65–99)
Glucose, Bld: 164 mg/dL — ABNORMAL HIGH (ref 65–99)
Glucose, Bld: 164 mg/dL — ABNORMAL HIGH (ref 65–99)
PHOSPHORUS: 4.5 mg/dL (ref 2.5–4.6)
POTASSIUM: 4.1 mmol/L (ref 3.5–5.1)
POTASSIUM: 4.2 mmol/L (ref 3.5–5.1)
POTASSIUM: 3.7 mmol/L (ref 3.5–5.1)
POTASSIUM: 3.7 mmol/L (ref 3.5–5.1)
Phosphorus: 6.1 mg/dL — ABNORMAL HIGH (ref 2.5–4.6)
Phosphorus: 6.2 mg/dL — ABNORMAL HIGH (ref 2.5–4.6)
Phosphorus: 4.5 mg/dL (ref 2.5–4.6)
SODIUM: 126 mmol/L — AB (ref 135–145)
Sodium: 119 mmol/L — CL (ref 135–145)
Sodium: 120 mmol/L — ABNORMAL LOW (ref 135–145)
Sodium: 122 mmol/L — ABNORMAL LOW (ref 135–145)

## 2016-11-09 LAB — CBC
HCT: 22.8 % — ABNORMAL LOW (ref 39.0–52.0)
HEMOGLOBIN: 7.7 g/dL — AB (ref 13.0–17.0)
MCH: 27.3 pg (ref 26.0–34.0)
MCHC: 33.8 g/dL (ref 30.0–36.0)
MCV: 80.9 fL (ref 78.0–100.0)
Platelets: 143 10*3/uL — ABNORMAL LOW (ref 150–400)
RBC: 2.82 MIL/uL — ABNORMAL LOW (ref 4.22–5.81)
RDW: 13.4 % (ref 11.5–15.5)
WBC: 4.8 10*3/uL (ref 4.0–10.5)

## 2016-11-09 LAB — GLUCOSE, CAPILLARY
GLUCOSE-CAPILLARY: 71 mg/dL (ref 65–99)
Glucose-Capillary: 91 mg/dL (ref 65–99)
Glucose-Capillary: 153 mg/dL — ABNORMAL HIGH (ref 65–99)
Glucose-Capillary: 164 mg/dL — ABNORMAL HIGH (ref 65–99)

## 2016-11-09 LAB — C-REACTIVE PROTEIN: CRP: 2.4 mg/dL — AB (ref <1.0)

## 2016-11-09 MED ORDER — DARBEPOETIN ALFA 60 MCG/0.3ML IJ SOSY
PREFILLED_SYRINGE | INTRAMUSCULAR | Status: AC
  Filled 2016-11-09: qty 0.3

## 2016-11-09 MED ORDER — CALCITRIOL 0.25 MCG PO CAPS
ORAL_CAPSULE | ORAL | Status: AC
  Filled 2016-11-09: qty 1

## 2016-11-09 MED ORDER — CALCITRIOL 0.5 MCG PO CAPS
ORAL_CAPSULE | ORAL | Status: AC
  Filled 2016-11-09: qty 1

## 2016-11-09 MED ORDER — DARBEPOETIN ALFA 60 MCG/0.3ML IJ SOSY
60.0000 ug | PREFILLED_SYRINGE | INTRAMUSCULAR | Status: DC
  Administered 2016-11-09: 60 ug via INTRAVENOUS
  Filled 2016-11-09: qty 0.3

## 2016-11-09 NOTE — Progress Notes (Signed)
PROGRESS NOTE    Russell Price  EAV:[id number] DOB: 28-Dec-1960 DOA: 11/06/2016 PCP: Arnoldo Morale, MD  Outpatient Specialists:     Brief Narrative:  56 y/o ? 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 the, hypertensive nephrosclerosis--now declared by nephrology  ESRD             Maturing AV fistula left arm--malfunctioning as of 6/22  TDC placed 6/ 22, right upper chest Moderately controlled diabetes-complicated by neuropathy (on gabapentin), right eye blindness             Last A1c~8             Admitted for DKA/2017 CAD with RCA ON 3 stenoses based on cath 03/04/2016             CHF EF 45 then 55 % on rpt studies              Hospitalized 4/15-4/21  and intubated for airway protection secondary to acute metabolic encephalopathy from DKA Hospitalized 9/27-10/5 for acute hemoptysis alveolar hemorrhage and? CVA Hyperlipidemia  Admitted early morning 6/20 severe hyponatremia 115, usual baseline 132, hyperkalemia 5.2 ? Volume overload secondary to oliguric renal failure Nephrology consulted   Assessment & Plan:   Principal Problem:   Hyponatremia Active Problems:   Essential hypertension   DM (diabetes mellitus) with complications (HCC)   Anemia of chronic disease   ESRD (end stage renal disease) (HCC)   Fluid overload   Psychogenic polydipsia-admission sodium 115, baseline 09/02/16 was 132 CK D5--->ESRD declared this admission Renal acidosis L AV fistula placement 05/14/2016--currently non  Appreciate VVS input re: [facility] 6/22, fistulogram as per Dr. Donzetta Matters as OP Secondary hyperparathyroidism  hypervolemic hyponatremia clinically secondary to volume overload-resolving oliguria sodium slowly ? 119  First episode of dialysis 6/21, tolerating subsequent dialysis well  Phosphorous binder, calcium carbonate and dialysate bath with micronutrient replacement as per nephrology  Continue strict I/O for now  Dispo dependent on Medicaid authorization, no  transport-Will discuss with family regarding resources as well as potential having community and friends chipping to see if they can transport him to dialysis  Anemia, microcytic External thrombosed hemorrhoid 6:00, 4:00  Patient has never had a colonoscopy, hemoccult neg  Ferritin 1,440, Tsat low, ? RES blockade vs Functional iron def--Get CRP  nulecit x 1 given 6/22, 1 unit PRBC 6/22  Hemoglobin 7.7-monitor labs a.m.  CAD-microvascular disease based on cath [id number] Chronic diastolic heart failure without acute component, EF 56% htn with complication of hypertensive glomerulosclerosis  Management will now be via dialysis  DC off of Lasix 160bid per nephrology since 6/21, holding chlorthalidone 25 daily  Dosing of Coreg 12.5 bid per nephrologist, he has not been taking Procardia XL 90 mg for CAD  Diabetic neuropathy, nephropathy type II since last 20 years  Continue Lantus 15 units  Continue sliding scale coverage-sugars 90-164  Blood sugars reasonable below 150  Continue gabapentin 300 daily for neuropathy   Hyperlipidemia  continue Lipitor  Moderate malnutrition  Consider supplements with NePro, protein level is less than 3   Full CODE STATUS SCD Inpatient Disposition unclear-I've discussed with social work regarding clip  Patient does not have Medicaid and is unable to get to dialysis therefore patient will probably need Medicaid authorization and then a facility that can dialyze him and will accept him. He is not ready for discharge and I expect he will be here until late next week  Consultants:   Nephrology  Vascular surgery  Procedures:  Dialysis  Temporary dialysis access placed in right upper chest 6/22  Antimicrobials:   None    Subjective:  Less swollen One episode of diarrhea today No dark stool or tarry stool Eating and drinking fairly No abdominal pain No shortness of breath   Objective: Vitals:   11/09/16 0800 11/09/16 0809 11/09/16  0820 11/09/16 0845  BP: (!) 141/76 (!) 135/56 (!) 144/80 (!) 142/77  Pulse: 74 76 66 66  Resp: 16 16 17 16   Temp: 98.9 F (37.2 C)     TempSrc: Oral     SpO2: 100%   99%  Weight: 86.7 kg (191 lb 2.2 oz)     Height:        Intake/Output Summary (Last 24 hours) at 11/09/16 0955 Last data filed at 11/09/16 0715  Gross per 24 hour  Intake              220 ml  Output              760 ml  Net             -540 ml   Filed Weights   11/08/16 0845 11/08/16 2047 11/09/16 0800  Weight: 88.2 kg (194 lb 7.1 oz) 86.7 kg (191 lb 1.6 oz) 86.7 kg (191 lb 2.2 oz)    Examination:  Scleral changes  JVD flatter S1 and S2 no murmur rub or gallop distended with mild volume, abdomen nontender nondistended Grade 1 lower extremity edema,   Data Reviewed: I have personally reviewed following labs and imaging studies  CBC:  Recent Labs Lab 11/05/16 2250 11/06/16 1639 11/07/16 0241 11/09/16 0830  WBC 4.0 3.7* 3.7* 4.8  NEUTROABS 2.7  --  2.3  --   HGB 8.3* 8.4* 7.6* 7.7*  HCT 24.1* 24.0* 22.0* 22.8*  MCV 80.1 78.4 77.7* 80.9  PLT 171 146* 141* 253*   Basic Metabolic Panel:  Recent Labs Lab 11/08/16 0848 11/08/16 1637 11/08/16 2111 11/09/16 0217 11/09/16 0830  NA 119* 121* 119* 119* 120*  K 4.0 4.0 4.1 4.1 4.2  CL 88* 91* 89* 88* 90*  CO2 20* 20* 19* 20* 20*  GLUCOSE 101* 103* 136* 164* 90  BUN 105* 104* 106* 107* 107*  CREATININE 5.40* 5.25* 5.49* 5.63* 5.73*  CALCIUM 7.1* 7.1* 7.3* 7.3* 7.4*  PHOS 6.1* 5.9* 6.1* 6.1* 6.2*   GFR: Estimated Creatinine Clearance: 15 mL/min (A) (by C-G formula based on SCr of 5.73 mg/dL (H)). Liver Function Tests:  Recent Labs Lab 11/05/16 2250  11/08/16 0848 11/08/16 1637 11/08/16 2111 11/09/16 0217 11/09/16 0830  AST 23  --   --   --   --   --   --   ALT 55  --   --   --   --   --   --   ALKPHOS 401*  --   --   --   --   --   --   BILITOT 1.0  --   --   --   --   --   --   PROT 6.1*  --   --   --   --   --   --   ALBUMIN 2.6*  <  > 2.4* 2.4* 2.4* 2.3* 2.4*  < > = values in this interval not displayed. No results for input(s): LIPASE, AMYLASE in the last 168 hours. No results for input(s): AMMONIA in the last 168 hours. Coagulation Profile: No results for input(s): INR, PROTIME in the  last 168 hours. Cardiac Enzymes: No results for input(s): CKTOTAL, CKMB, CKMBINDEX, TROPONINI in the last 168 hours. BNP (last 3 results) No results for input(s): PROBNP in the last 8760 hours. HbA1C: No results for input(s): HGBA1C in the last 72 hours. CBG:  Recent Labs Lab 11/08/16 1410 11/08/16 1554 11/08/16 1748 11/08/16 2042 11/09/16 0724  GLUCAP 134* 115* 96 125* 91   Lipid Profile: No results for input(s): CHOL, HDL, LDLCALC, TRIG, CHOLHDL, LDLDIRECT in the last 72 hours. Thyroid Function Tests: No results for input(s): TSH, T4TOTAL, FREET4, T3FREE, THYROIDAB in the last 72 hours. Anemia Panel:  Recent Labs  11/07/16 1056  VITAMINB12 1,161*  FOLATE 21.5  FERRITIN 1,461*  TIBC 186*  IRON 81  RETICCTPCT 1.8   Urine analysis:    Component Value Date/Time   COLORURINE YELLOW 11/06/2016 0228   APPEARANCEUR CLEAR 11/06/2016 0228   LABSPEC 1.006 11/06/2016 0228   PHURINE 5.0 11/06/2016 0228   GLUCOSEU NEGATIVE 11/06/2016 0228   HGBUR MODERATE (A) 11/06/2016 0228   BILIRUBINUR NEGATIVE 11/06/2016 0228   BILIRUBINUR negative 02/27/2015 1458   BILIRUBINUR neg 02/22/2013 1737   KETONESUR NEGATIVE 11/06/2016 0228   PROTEINUR 30 (A) 11/06/2016 0228   UROBILINOGEN 0.2 02/28/2015 0032   NITRITE NEGATIVE 11/06/2016 0228   LEUKOCYTESUR NEGATIVE 11/06/2016 0228   Sepsis Labs: @LABRCNTIP (procalcitonin:4,lacticidven:4)  ) Recent Results (from the past 240 hour(s))  MRSA PCR Screening     Status: None   Collection Time: 11/07/16 10:15 AM  Result Value Ref Range Status   MRSA by PCR NEGATIVE NEGATIVE Final    Comment:        The GeneXpert MRSA Assay (FDA approved for NASAL specimens only), is one component  of a comprehensive MRSA colonization surveillance program. It is not intended to diagnose MRSA infection nor to guide or monitor treatment for MRSA infections.   Surgical PCR screen     Status: None   Collection Time: 11/08/16  2:09 PM  Result Value Ref Range Status   MRSA, PCR NEGATIVE NEGATIVE Final   Staphylococcus aureus NEGATIVE NEGATIVE Final    Comment:        The Xpert SA Assay (FDA approved for NASAL specimens in patients over 73 years of age), is one component of a comprehensive surveillance program.  Test performance has been validated by [facility] for patients greater than or equal to 27 year old. It is not intended to diagnose infection nor to guide or monitor treatment.       Radiology Studies: Dg Chest Port 1 View  Result Date: 11/08/2016 CLINICAL DATA:  Central catheter placement EXAM: PORTABLE CHEST 1 VIEW COMPARISON:  November 06, 2016 FINDINGS: Central catheter tip is in the right atrium approximately 5 cm distal to the superior vena cava -right atrium junction. No pneumothorax. There is stable cardiomegaly with pulmonary vascularity within normal limits. There is atelectatic change in the lung bases. There is trace interstitial edema in the perihilar regions. Lungs elsewhere are clear. No adenopathy appreciable. IMPRESSION: Central catheter tip in right atrium. No pneumothorax. Stable cardiomegaly. Question underlying pericardial effusion given the cardiac silhouette. Bibasilar atelectasis. Slight interstitial edema in the perihilar regions. Lungs elsewhere clear. Electronically Signed   By: Lowella Grip III M.D.   On: 11/08/2016 16:06   Dg Fluoro Guide Cv Line-no Report  Result Date: 11/08/2016 Fluoroscopy was utilized by the requesting physician.  No radiographic interpretation.     Scheduled Meds: . aspirin  81 mg Oral Daily  . atorvastatin  40  mg Oral q1800  . calcitRIOL  0.25 mcg Oral Daily  . carvedilol  12.5 mg Oral BID WC  . darbepoetin  (ARANESP) injection - DIALYSIS  60 mcg Intravenous Q Sat-HD  . feeding supplement (NEPRO CARB STEADY)  237 mL Oral BID BM  . gabapentin  300 mg Oral Daily  . heparin  5,000 Units Subcutaneous Q8H  . insulin aspart  0-9 Units Subcutaneous TID WC  . insulin glargine  15 Units Subcutaneous QHS  . multivitamin  1 tablet Oral QHS   Continuous Infusions: . sodium chloride Stopped (11/08/16 1547)  . sodium chloride    . ferric gluconate (FERRLECIT/NULECIT) IV       LOS: 3 days    Time spent: [place], MD [facility]   If 7PM-7AM, please contact night-coverage [email or web address] Password [facility] 11/09/2016, 9:55 AM

## 2016-11-09 NOTE — Progress Notes (Signed)
[facility] Progress Note   Subjective:  Seen on HD- s/p PC late yest.  Making some urine and sodium is increasing slowly - 119 today   Objective Vitals:   11/09/16 0800 11/09/16 0809 11/09/16 0820 11/09/16 0845  BP: (!) 141/76 (!) 135/56 (!) 144/80 (!) 142/77  Pulse: 74 76 66 66  Resp: 16 16 17 16   Temp: 98.9 F (37.2 C)     TempSrc: Oral     SpO2: 100%   99%  Weight: 86.7 kg (191 lb 2.2 oz)     Height:       Physical Exam General: Well appearing male. Lying on back without dyspnea/NAD. Heart: RRR; no murmur. Lungs: CTA in upper lobes Abdomen: Tense/distended. No tenderness. Extremities: 2+ pitting LE edema. Dialysis Access: LUE AVF + edematous- now new PC running BFR 250  Additional Objective Labs: Basic Metabolic Panel:  Recent Labs Lab 11/08/16 1637 11/08/16 2111 11/09/16 0217  NA 121* 119* 119*  K 4.0 4.1 4.1  CL 91* 89* 88*  CO2 20* 19* 20*  GLUCOSE 103* 136* 164*  BUN 104* 106* 107*  CREATININE 5.25* 5.49* 5.63*  CALCIUM 7.1* 7.3* 7.3*  PHOS 5.9* 6.1* 6.1*   Liver Function Tests:  Recent Labs Lab 11/05/16 2250  11/08/16 1637 11/08/16 2111 11/09/16 0217  AST 23  --   --   --   --   ALT 55  --   --   --   --   ALKPHOS 401*  --   --   --   --   BILITOT 1.0  --   --   --   --   PROT 6.1*  --   --   --   --   ALBUMIN 2.6*  < > 2.4* 2.4* 2.3*  < > = values in this interval not displayed. CBC:  Recent Labs Lab 11/05/16 2250 11/06/16 1639 11/07/16 0241 11/09/16 0830  WBC 4.0 3.7* 3.7* 4.8  NEUTROABS 2.7  --  2.3  --   HGB 8.3* 8.4* 7.6* 7.7*  HCT 24.1* 24.0* 22.0* 22.8*  MCV 80.1 78.4 77.7* 80.9  PLT 171 146* 141* 143*   CBG:  Recent Labs Lab 11/08/16 1410 11/08/16 1554 11/08/16 1748 11/08/16 2042 11/09/16 0724  GLUCAP 134* 115* 96 125* 91   Iron Studies:   Recent Labs  11/07/16 1056  IRON 81  TIBC 186*  FERRITIN 1,461*   Medications: . sodium chloride Stopped (11/08/16 1547)  . sodium chloride    .  ferric gluconate (FERRLECIT/NULECIT) IV     . aspirin  81 mg Oral Daily  . atorvastatin  40 mg Oral q1800  . calcitRIOL  0.25 mcg Oral Daily  . carvedilol  12.5 mg Oral BID WC  . darbepoetin (ARANESP) injection - DIALYSIS  60 mcg Intravenous Q Sat-HD  . feeding supplement (NEPRO CARB STEADY)  237 mL Oral BID BM  . gabapentin  300 mg Oral Daily  . heparin  5,000 Units Subcutaneous Q8H  . insulin aspart  0-9 Units Subcutaneous TID WC  . insulin glargine  15 Units Subcutaneous QHS  . multivitamin  1 tablet Oral QHS    Dialysis Orders: establishing.  Assessment/Plan: 1. Hyponatremia: Likely d/t polydipsia and decreased free water excretion due to CKD. Continue fluid restriction. Off lasix at this time since HD started. Anticipate will continue to improve  2. CKD V -> ESRD: S/p HD#1 on 6/21, but + infiltration. Unable to be cannulated 6/22. VVS consulted,  s/p PC- now HD #2 today , #3 for Monday.   CLIP in process - pt will need transportation.  AVF infiltrated- rest for now- VVS mentioned fgram as OP 3. HTN/volume: BP high with significant volume excess; continue UF with dialysis when able. 4. Anemia: Hgb 7.6. S/p IV iron and 1U PRBCs on 6/21. Started Aranesp 44mcg today. 5. Secondary hyperparathyroidism: Corrected Ca ok. Phos high, but has only been dialyzed once. Follow after a couple HD and then start binder if still high. Continue calcitriol daily for now. 6. Nutrition: Alb 2.3. Start Nepro as supplement. Waited too long to present to medical attention 7. Type 2 DM: On insulin, per primary. 8. CAD: No current symptoms. On Coreg/atorvastatin.  Corliss Parish, MD 11/09/2016

## 2016-11-09 NOTE — Procedures (Signed)
Patient was seen on dialysis and the procedure was supervised.  BFR 250  Via PC BP is  140/73.   Patient appears to be tolerating treatment well  Stacee Earp A 11/09/2016

## 2016-11-09 NOTE — Progress Notes (Signed)
  Progress Note    11/09/2016 10:24 AM 1 Day Post-Op  Subjective:  Tired but doing ok  Vitals:   11/09/16 0820 11/09/16 0845  BP: (!) 144/80 (!) 142/77  Pulse: 66 66  Resp: 17 16  Temp:      Physical Exam: Evaluated on hd Catheter is functioning Left arm with palpable thrill but hematoma  CBC    Component Value Date/Time   WBC 4.8 11/09/2016 0830   RBC 2.82 (L) 11/09/2016 0830   HGB 7.7 (L) 11/09/2016 0830   HCT 22.8 (L) 11/09/2016 0830   PLT 143 (L) 11/09/2016 0830   MCV 80.9 11/09/2016 0830   MCV 85.0 02/27/2015 1458   MCH 27.3 11/09/2016 0830   MCHC 33.8 11/09/2016 0830   RDW 13.4 11/09/2016 0830   LYMPHSABS 0.7 11/07/2016 0241   MONOABS 0.3 11/07/2016 0241   EOSABS 0.4 11/07/2016 0241   BASOSABS 0.0 11/07/2016 0241    BMET    Component Value Date/Time   NA 120 (L) 11/09/2016 0830   NA 132 (L) 09/02/2016 1158   K 4.2 11/09/2016 0830   CL 90 (L) 11/09/2016 0830   CO2 20 (L) 11/09/2016 0830   GLUCOSE 90 11/09/2016 0830   BUN 107 (H) 11/09/2016 0830   BUN 111 (HH) 09/02/2016 1158   CREATININE 5.73 (H) 11/09/2016 0830   CREATININE 4.38 (H) 03/01/2016 0856   CALCIUM 7.4 (L) 11/09/2016 0830   GFRNONAA 10 (L) 11/09/2016 0830   GFRNONAA 16 (L) 09/21/2015 1129   GFRAA 11 (L) 11/09/2016 0830   GFRAA 19 (L) 09/21/2015 1129    INR    Component Value Date/Time   INR 1.0 03/01/2016 0856     Intake/Output Summary (Last 24 hours) at 11/09/16 1024 Last data filed at 11/09/16 0715  Gross per 24 hour  Intake              100 ml  Output              760 ml  Net             -660 ml     Assessment:  56 y.o. male is s/p tdc placement, avf infiltrated at hd  Plan: [place] for hd via tdc Will plan fistulogram as outpatient after allowing avf to rest given recent infiltration event.    Jasim Harari C. Donzetta Matters, MD [facility] Office: [phone] Pager: [phone]  11/09/2016 10:24 AM

## 2016-11-09 NOTE — Plan of Care (Signed)
Problem: Safety: Goal: Ability to remain free from injury will improve Outcome: Progressing Safety precautions maintained. Patient and family are aware of safety precautions  Problem: Pain Managment: Goal: General experience of comfort will improve Outcome: Progressing Patient stated he is comfortable, denies pain and discomfort  Problem: Skin Integrity: Goal: Risk for impaired skin integrity will decrease Outcome: Progressing No skin issues noted  Problem: Activity: Goal: Risk for activity intolerance will decrease Outcome: Progressing Very weak, remains in the bed this shift  Problem: Nutrition: Goal: Adequate nutrition will be maintained Outcome: Progressing Sated he has fair appetite  Problem: Bowel/Gastric: Goal: Will not experience complications related to bowel motility Outcome: Progressing No bowel issues reported

## 2016-11-09 NOTE — Progress Notes (Signed)
Call received from lab about sodium level of 119, MD on call was paged, MD called back and stated no changes because patient is stable and asymptomatic

## 2016-11-10 LAB — RENAL FUNCTION PANEL
ALBUMIN: 2.2 g/dL — AB (ref 3.5–5.0)
ALBUMIN: 2.4 g/dL — AB (ref 3.5–5.0)
Albumin: 2.4 g/dL — ABNORMAL LOW (ref 3.5–5.0)
Anion gap: 9 (ref 5–15)
Anion gap: 10 (ref 5–15)
Anion gap: 10 (ref 5–15)
BUN: 72 mg/dL — AB (ref 6–20)
BUN: 72 mg/dL — AB (ref 6–20)
BUN: 74 mg/dL — AB (ref 6–20)
CALCIUM: 7.8 mg/dL — AB (ref 8.9–10.3)
CALCIUM: 7.9 mg/dL — AB (ref 8.9–10.3)
CHLORIDE: 93 mmol/L — AB (ref 101–111)
CO2: 24 mmol/L (ref 22–32)
CO2: 23 mmol/L (ref 22–32)
CO2: 23 mmol/L (ref 22–32)
CREATININE: 4.84 mg/dL — AB (ref 0.61–1.24)
CREATININE: 5.17 mg/dL — AB (ref 0.61–1.24)
Calcium: 7.8 mg/dL — ABNORMAL LOW (ref 8.9–10.3)
Chloride: 95 mmol/L — ABNORMAL LOW (ref 101–111)
Chloride: 94 mmol/L — ABNORMAL LOW (ref 101–111)
Creatinine, Ser: 5.12 mg/dL — ABNORMAL HIGH (ref 0.61–1.24)
GFR calc Af Amer: 14 mL/min — ABNORMAL LOW (ref >60)
GFR calc Af Amer: 13 mL/min — ABNORMAL LOW (ref >60)
GFR calc Af Amer: 13 mL/min — ABNORMAL LOW (ref >60)
GFR calc non Af Amer: 11 mL/min — ABNORMAL LOW (ref >60)
GFR, EST NON AFRICAN AMERICAN: 12 mL/min — AB (ref >60)
GFR, EST NON AFRICAN AMERICAN: 11 mL/min — AB (ref >60)
GLUCOSE: 87 mg/dL (ref 65–99)
Glucose, Bld: 83 mg/dL (ref 65–99)
Glucose, Bld: 104 mg/dL — ABNORMAL HIGH (ref 65–99)
PHOSPHORUS: 5 mg/dL — AB (ref 2.5–4.6)
PHOSPHORUS: 5.1 mg/dL — AB (ref 2.5–4.6)
POTASSIUM: 4 mmol/L (ref 3.5–5.1)
Phosphorus: 5.1 mg/dL — ABNORMAL HIGH (ref 2.5–4.6)
Potassium: 3.8 mmol/L (ref 3.5–5.1)
Potassium: 3.9 mmol/L (ref 3.5–5.1)
SODIUM: 128 mmol/L — AB (ref 135–145)
SODIUM: 127 mmol/L — AB (ref 135–145)
Sodium: 126 mmol/L — ABNORMAL LOW (ref 135–145)

## 2016-11-10 LAB — GLUCOSE, CAPILLARY
GLUCOSE-CAPILLARY: 37 mg/dL — AB (ref 65–99)
GLUCOSE-CAPILLARY: 106 mg/dL — AB (ref 65–99)
Glucose-Capillary: 82 mg/dL (ref 65–99)
Glucose-Capillary: 99 mg/dL (ref 65–99)
Glucose-Capillary: 135 mg/dL — ABNORMAL HIGH (ref 65–99)
Glucose-Capillary: 207 mg/dL — ABNORMAL HIGH (ref 65–99)

## 2016-11-10 NOTE — Progress Notes (Signed)
[facility] Progress Note   Subjective:  Seen in room. No complaints today. S/p HD #2 yesterday via new TDC without issues, 1L removed. Na level much improved today.  Objective Vitals:   11/09/16 1812 11/09/16 2045 11/10/16 0555 11/10/16 0900  BP: (!) 154/65 (!) 122/50 (!) 146/72 (!) 125/57  Pulse: 77 70 65 62  Resp: 18 16 16 18   Temp: 98.4 F (36.9 C) 98.8 F (37.1 C) 99 F (37.2 C) 99.3 F (37.4 C)  TempSrc: Oral Oral Oral Oral  SpO2: 98% 98% 98% 98%  Weight:  85.3 kg (188 lb)    Height:       Physical Exam General: Well appearing male. Lying on back without dyspnea/NAD. Heart: RRR; no murmur. Lungs: CTA in upper lobes Abdomen: Tense/distended. No tenderness. Extremities: 1+ pitting LE edema. Dialysis Access: LUE AVF + edematous; new TDC in R chest  Additional Objective Labs: Basic Metabolic Panel:  Recent Labs Lab 11/09/16 2022 11/10/16 0304 11/10/16 0801  NA 122* 128* 127*  K 3.7 3.8 3.9  CL 91* 95* 94*  CO2 22 24 23   GLUCOSE 164* 83 87  BUN 72* 72* 72*  CREATININE 4.49* 4.84* 5.12*  CALCIUM 7.4* 7.8* 7.9*  PHOS 4.5 5.0* 5.1*   Liver Function Tests:  Recent Labs Lab 11/05/16 2250  11/09/16 2022 11/10/16 0304 11/10/16 0801  AST 23  --   --   --   --   ALT 55  --   --   --   --   ALKPHOS 401*  --   --   --   --   BILITOT 1.0  --   --   --   --   PROT 6.1*  --   --   --   --   ALBUMIN 2.6*  < > 2.3* 2.2* 2.4*  < > = values in this interval not displayed.  CBC:  Recent Labs Lab 11/05/16 2250 11/06/16 1639 11/07/16 0241 11/09/16 0830  WBC 4.0 3.7* 3.7* 4.8  NEUTROABS 2.7  --  2.3  --   HGB 8.3* 8.4* 7.6* 7.7*  HCT 24.1* 24.0* 22.0* 22.8*  MCV 80.1 78.4 77.7* 80.9  PLT 171 146* 141* 143*   CBG:  Recent Labs Lab 11/09/16 1713 11/09/16 2044 11/10/16 0543 11/10/16 0610 11/10/16 0726  GLUCAP 153* 164* 37* 82 99   Iron Studies:  Recent Labs  11/07/16 1056  IRON 81  TIBC 186*  FERRITIN 1,461*    Studies/Results: Dg Chest Port 1 View  Result Date: 11/08/2016 CLINICAL DATA:  Central catheter placement EXAM: PORTABLE CHEST 1 VIEW COMPARISON:  November 06, 2016 FINDINGS: Central catheter tip is in the right atrium approximately 5 cm distal to the superior vena cava -right atrium junction. No pneumothorax. There is stable cardiomegaly with pulmonary vascularity within normal limits. There is atelectatic change in the lung bases. There is trace interstitial edema in the perihilar regions. Lungs elsewhere are clear. No adenopathy appreciable. IMPRESSION: Central catheter tip in right atrium. No pneumothorax. Stable cardiomegaly. Question underlying pericardial effusion given the cardiac silhouette. Bibasilar atelectasis. Slight interstitial edema in the perihilar regions. Lungs elsewhere clear. Electronically Signed   By: Lowella Grip III M.D.   On: 11/08/2016 16:06   Dg Fluoro Guide Cv Line-no Report  Result Date: 11/08/2016 Fluoroscopy was utilized by the requesting physician.  No radiographic interpretation.   Medications: . sodium chloride Stopped (11/08/16 1547)  . sodium chloride     . aspirin  81  mg Oral Daily  . atorvastatin  40 mg Oral q1800  . calcitRIOL  0.25 mcg Oral Daily  . carvedilol  12.5 mg Oral BID WC  . darbepoetin (ARANESP) injection - DIALYSIS  60 mcg Intravenous Q Sat-HD  . feeding supplement (NEPRO CARB STEADY)  237 mL Oral BID BM  . gabapentin  300 mg Oral Daily  . heparin  5,000 Units Subcutaneous Q8H  . insulin aspart  0-9 Units Subcutaneous TID WC  . insulin glargine  15 Units Subcutaneous QHS  . multivitamin  1 tablet Oral QHS    Dialysis Orders: establishing.  Assessment/Plan: 1. Hyponatremia:  polydipsia and decreased free water excretion due to CKD. Continue fluid restriction.  HD started. Na much improved today. 2. CKD V -> ESRD: S/p HD#1 on 6/21, but + infiltration. Unable to be cannulated 6/22. VVS consulted, s/p PC- now HD #2 6/23 , #3 for  Monday 6/25.  CLIP in process. He will need transportation. Resting AVF for now with plan for outpatient fistulogram after d/c. As soon as has spot confirmed could be discharged - ? Monday or Tuesday  3. HTN/volume: BP coming down, still with volume excess; continue UF with dialysis when able. On coreg 12.5 BID 4. Anemia: Hgb 7.7. S/p IV iron and 1U PRBCs on 6/21. Aranesp 16mcg weekly started 6/23. 5. Secondary hyperparathyroidism: Corrected Ca ok. Phos has come down with HD alone, no binders for now. Continue calcitriol daily for now, change to TIW dosing with HD on d/c. 6. Nutrition: Alb 2.4. Continue Nepro supplements. 7. Type 2 DM: On insulin, per primary. 8. CAD: No current symptoms. On Coreg/atorvastatin.  Veneta Penton, PA-C 11/10/2016, 9:19 AM  [facility] Pager: [phone]  Patient seen and examined, agree with above note with above modifications. Continues to feel better daily - for third HD tomorrow- once OP HD center established could be discharged- wife wants to make sure he has transportation as she works  Corliss Parish, MD 11/10/2016

## 2016-11-10 NOTE — Progress Notes (Signed)
PROGRESS NOTE    Russell Price  TML:[id number] DOB: [birth year] DOA: 11/06/2016 PCP: Arnoldo Morale, MD  Outpatient Specialists:     Brief Narrative:  56 y/o ? 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 the, hypertensive nephrosclerosis--now declared by nephrology  ESRD             Maturing AV fistula left arm--malfunctioning as of 6/22  TDC placed 6/ 22, right upper chest Moderately controlled diabetes-complicated by neuropathy (on gabapentin), right eye blindness             Last A1c~8             Admitted for DKA/2017 CAD with RCA ON 3 stenoses based on cath 03/04/2016             CHF EF 45 then 55 % on rpt studies              Hospitalized 4/15-4/21  and intubated for airway protection secondary to acute metabolic encephalopathy from DKA Hospitalized 9/27-10/5 for acute hemoptysis alveolar hemorrhage and? CVA Hyperlipidemia  Admitted early morning 6/20 severe hyponatremia 115, usual baseline 132, hyperkalemia 5.2 ? 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 Donzetta Matters as OP Secondary hyperparathyroidism-on binders per nephrology  hypervolemic hyponatremia clinically secondary to volume overload-resolving oliguria sodium 119-->128  First episode of dialysis 6/21, tolerating subsequent dialysis well  Continue strict I/O for now, collect urine-nursing informed  Change every 6 hourly Chem-7 to daily renal  panel  Dispo dependent on Medicaid authorization, no transport-family going to [facility] Medicaid office tomorrow to work out transport details  Anemia, microcytic External thrombosed hemorrhoid 6:00, 4:00 Thrombocytopenia mild, probably from overall iron deficiency  Patient has never had a colonoscopy, hemoccult neg  Ferritin 1,440, Tsat low, ? RES blockade vs Functional iron def--Get CRP  nulecit x 1 given 6/22, 1 unit PRBC 6/22  Hemoglobin 7. repeat labs 6/25 at dialysis  Now has low-grade thrombocyteWith platelets in the 140 range. Monitor trend and workup if needed  CAD-microvascular disease based on cath [id number] [id number] Chronic diastolic heart failure without acute component, EF 75% htn with complication of hypertensive glomerulosclerosis  Management will now be via dialysis  DC off of Lasix 160 bid per nephrology since 6/21, holding chlorthalidone 25 daily  Dosing of Coreg 12.5 bid per nephrologist, he has not been taking Procardia XL 90 mg for CAD  Diabetic neuropathy, nephropathy type II since last 20 years  Continue Lantus 15 units  Continue sensitive sliding scale coverage-sugars 87-106  Blood sugars reasonable below 150  Continue gabapentin 300 daily for neuropathy   Hyperlipidemia  continue Lipitor  Moderate malnutrition  Consider supplements with NePro, protein level is less than 3   Full CODE STATUS SCD Inpatient Disposition unclear-I've discussed with social work regarding clip  Patient will need Medicare Medicaid transport to and from dialysis prior to discharge Long discussion with brother at bedside 6/25   Consultants:   Nephrology  Vascular surgery  Procedures:   Dialysis  Temporary dialysis access placed in right upper chest 6/22  Antimicrobials:   None    Subjective:  Overall well no new issues No diarrhea No chest pain Eating fair Feels improved from admission No fever   Objective: Vitals:   11/09/16 1812 11/09/16 2045 11/10/16 0555  11/10/16 0900  BP: (!) 154/65 (!) 122/50 (!) 146/72 (!) 125/57  Pulse: 77 70 65 62  Resp: 18 16 16 18   Temp: 98.4 F (36.9 C) 98.8 F (37.1 C) 99 F (37.2 C) 99.3 F (37.4 C)  TempSrc: Oral Oral Oral Oral  SpO2: 98% 98% 98% 98%  Weight:  85.3 kg (188 lb)    Height:        Intake/Output Summary (Last 24 hours) at 11/10/16 1550 Last data filed at 11/10/16 1425  Gross per 24 hour  Intake             1320 ml  Output              300 ml  Net             1020 ml   Filed Weights   11/09/16 0800 11/09/16 1045 11/09/16 2045  Weight: 86.7 kg (191 lb 2.2 oz) 85.5 kg (188 lb 7.9 oz) 85.3 kg (188 lb)    Examination:  Scleral changesAnd postoperative changes to right eye  JVD flat S1 and S2 no murmur rub or gallop distended with mild volume, abdomen nontender nondistended Grade 1 lower extremity edema, Neurologically intact   Data Reviewed: I have personally reviewed following labs and imaging studies  CBC:  Recent Labs Lab 11/05/16 2250 11/06/16 1639 11/07/16 0241 11/09/16 0830  WBC 4.0 3.7* 3.7* 4.8  NEUTROABS 2.7  --  2.3  --   HGB 8.3* 8.4* 7.6* 7.7*  HCT 24.1* 24.0* 22.0* 22.8*  MCV 80.1 78.4 77.7* 80.9  PLT 171 146* 141* 161*   Basic Metabolic Panel:  Recent Labs Lab 11/09/16 1535 11/09/16 2022 11/10/16 0304 11/10/16 0801 11/10/16 1428  NA 126* 122* 128* 127* 126*  K 3.7 3.7 3.8 3.9 4.0  CL 93* 91* 95* 94* 93*  CO2 22 22 24 23 23   GLUCOSE 118* 164* 83 87 104*  BUN 69* 72* 72* 72* 74*  CREATININE 4.22* 4.49* 4.84* 5.12* 5.17*  CALCIUM 7.7* 7.4* 7.8* 7.9* 7.8*  PHOS 4.5 4.5 5.0* 5.1* 5.1*   GFR: Estimated Creatinine Clearance: 16.6 mL/min (A) (by C-G formula based on SCr of 5.17 mg/dL (H)). Liver Function Tests:  Recent Labs Lab 11/05/16 2250  11/09/16 1535 11/09/16 2022 11/10/16 0304 11/10/16 0801 11/10/16 1428  AST 23  --   --   --   --   --   --   ALT 55  --   --   --   --   --   --   ALKPHOS 401*  --   --   --   --   --   --   BILITOT  1.0  --   --   --   --   --   --   PROT 6.1*  --   --   --   --   --   --   ALBUMIN 2.6*  < > 2.5* 2.3* 2.2* 2.4* 2.4*  < > = values in this interval not displayed. No results for input(s): LIPASE, AMYLASE in the last 168 hours. No results for input(s): AMMONIA in the last 168 hours. Coagulation Profile: No results for input(s): INR,  PROTIME in the last 168 hours. Cardiac Enzymes: No results for input(s): CKTOTAL, CKMB, CKMBINDEX, TROPONINI in the last 168 hours. BNP (last 3 results) No results for input(s): PROBNP in the last 8760 hours. HbA1C: No results for input(s): HGBA1C in the last 72 hours. CBG:  Recent Labs Lab 11/09/16 2044 11/10/16 0543 11/10/16 0610 11/10/16 0726 11/10/16 1146  GLUCAP 164* 37* 82 99 106*   Lipid Profile: No results for input(s): CHOL, HDL, LDLCALC, TRIG, CHOLHDL, LDLDIRECT in the last 72 hours. Thyroid Function Tests: No results for input(s): TSH, T4TOTAL, FREET4, T3FREE, THYROIDAB in the last 72 hours. Anemia Panel: No results for input(s): VITAMINB12, FOLATE, FERRITIN, TIBC, IRON, RETICCTPCT in the last 72 hours. Urine analysis:    Component Value Date/Time   COLORURINE YELLOW 11/06/2016 0228   APPEARANCEUR CLEAR 11/06/2016 0228   LABSPEC 1.006 11/06/2016 0228   PHURINE 5.0 11/06/2016 0228   GLUCOSEU NEGATIVE 11/06/2016 0228   HGBUR MODERATE (A) 11/06/2016 0228   BILIRUBINUR NEGATIVE 11/06/2016 0228   BILIRUBINUR negative 02/27/2015 1458   BILIRUBINUR neg 02/22/2013 1737   KETONESUR NEGATIVE 11/06/2016 0228   PROTEINUR 30 (A) 11/06/2016 0228   UROBILINOGEN 0.2 02/28/2015 0032   NITRITE NEGATIVE 11/06/2016 0228   LEUKOCYTESUR NEGATIVE 11/06/2016 0228   Sepsis Labs: @LABRCNTIP (procalcitonin:4,lacticidven:4)  ) Recent Results (from the past 240 hour(s))  MRSA PCR Screening     Status: None   Collection Time: 11/07/16 10:15 AM  Result Value Ref Range Status   MRSA by PCR NEGATIVE NEGATIVE Final    Comment:        The GeneXpert MRSA  Assay (FDA approved for NASAL specimens only), is one component of a comprehensive MRSA colonization surveillance program. It is not intended to diagnose MRSA infection nor to guide or monitor treatment for MRSA infections.   Surgical PCR screen     Status: None   Collection Time: 11/08/16  2:09 PM  Result Value Ref Range Status   MRSA, PCR NEGATIVE NEGATIVE Final   Staphylococcus aureus NEGATIVE NEGATIVE Final    Comment:        The Xpert SA Assay (FDA approved for NASAL specimens in patients over 18 years of age), is one component of a comprehensive surveillance program.  Test performance has been validated by [facility] for patients greater than or equal to 71 year old. It is not intended to diagnose infection nor to guide or monitor treatment.       Radiology Studies: Dg Chest Port 1 View  Result Date: 11/08/2016 CLINICAL DATA:  Central catheter placement EXAM: PORTABLE CHEST 1 VIEW COMPARISON:  November 06, 2016 FINDINGS: Central catheter tip is in the right atrium approximately 5 cm distal to the superior vena cava -right atrium junction. No pneumothorax. There is stable cardiomegaly with pulmonary vascularity within normal limits. There is atelectatic change in the lung bases. There is trace interstitial edema in the perihilar regions. Lungs elsewhere are clear. No adenopathy appreciable. IMPRESSION: Central catheter tip in right atrium. No pneumothorax. Stable cardiomegaly. Question underlying pericardial effusion given the cardiac silhouette. Bibasilar atelectasis. Slight interstitial edema in the perihilar regions. Lungs elsewhere clear. Electronically Signed   By: Lowella Grip III M.D.   On: 11/08/2016 16:06     Scheduled Meds: . aspirin  81 mg Oral Daily  . atorvastatin  40 mg Oral q1800  . calcitRIOL  0.25 mcg Oral Daily  . carvedilol  12.5 mg Oral BID WC  . darbepoetin (ARANESP) injection - DIALYSIS  60 mcg Intravenous  Q Sat-HD  . feeding supplement (NEPRO  CARB STEADY)  237 mL Oral BID BM  . gabapentin  300 mg Oral Daily  . heparin  5,000 Units Subcutaneous Q8H  . insulin aspart  0-9 Units Subcutaneous TID WC  . insulin glargine  15 Units Subcutaneous QHS  . multivitamin  1 tablet Oral QHS   Continuous Infusions: . sodium chloride Stopped (11/08/16 1547)  . sodium chloride       LOS: 4 days    Time spent: [place], MD [facility]   If 7PM-7AM, please contact night-coverage [email or web address] Password [email or web address] 11/10/2016, 3:50 PM

## 2016-11-11 ENCOUNTER — Encounter (HOSPITAL_COMMUNITY): Payer: Self-pay | Admitting: Vascular Surgery

## 2016-11-11 LAB — RENAL FUNCTION PANEL
ALBUMIN: 2.3 g/dL — AB (ref 3.5–5.0)
ANION GAP: 10 (ref 5–15)
BUN: 79 mg/dL — ABNORMAL HIGH (ref 6–20)
CALCIUM: 8 mg/dL — AB (ref 8.9–10.3)
CO2: 22 mmol/L (ref 22–32)
Chloride: 94 mmol/L — ABNORMAL LOW (ref 101–111)
Creatinine, Ser: 5.73 mg/dL — ABNORMAL HIGH (ref 0.61–1.24)
GFR, EST AFRICAN AMERICAN: 11 mL/min — AB (ref >60)
GFR, EST NON AFRICAN AMERICAN: 10 mL/min — AB (ref >60)
GLUCOSE: 133 mg/dL — AB (ref 65–99)
PHOSPHORUS: 4.9 mg/dL — AB (ref 2.5–4.6)
Potassium: 4 mmol/L (ref 3.5–5.1)
SODIUM: 126 mmol/L — AB (ref 135–145)

## 2016-11-11 LAB — GLUCOSE, CAPILLARY
GLUCOSE-CAPILLARY: 132 mg/dL — AB (ref 65–99)
Glucose-Capillary: 139 mg/dL — ABNORMAL HIGH (ref 65–99)
Glucose-Capillary: 80 mg/dL (ref 65–99)
Glucose-Capillary: 110 mg/dL — ABNORMAL HIGH (ref 65–99)

## 2016-11-11 LAB — CBC
HCT: 20.4 % — ABNORMAL LOW (ref 39.0–52.0)
HEMOGLOBIN: 7 g/dL — AB (ref 13.0–17.0)
MCH: 27.8 pg (ref 26.0–34.0)
MCHC: 34.3 g/dL (ref 30.0–36.0)
MCV: 81 fL (ref 78.0–100.0)
Platelets: 110 10*3/uL — ABNORMAL LOW (ref 150–400)
RBC: 2.52 MIL/uL — ABNORMAL LOW (ref 4.22–5.81)
RDW: 13.5 % (ref 11.5–15.5)
WBC: 5.4 10*3/uL (ref 4.0–10.5)

## 2016-11-11 LAB — PREPARE RBC (CROSSMATCH)

## 2016-11-11 MED ORDER — HEPARIN SODIUM (PORCINE) 1000 UNIT/ML DIALYSIS: 20.0000 [IU]/kg | INTRAMUSCULAR | Status: DC | PRN

## 2016-11-11 MED ORDER — SODIUM CHLORIDE 0.9 % IV SOLN: Freq: Once | INTRAVENOUS | Status: DC

## 2016-11-11 MED ORDER — ACETAMINOPHEN 325 MG PO TABS
650.0000 mg | ORAL_TABLET | Freq: Once | ORAL | Status: AC
  Administered 2016-11-13: 650 mg via ORAL
  Filled 2016-11-11: qty 2

## 2016-11-11 MED ORDER — SODIUM CHLORIDE 0.9 % IV SOLN: 250.0000 mL | INTRAVENOUS | Status: DC | PRN

## 2016-11-11 MED ORDER — DIPHENHYDRAMINE HCL 25 MG PO CAPS
25.0000 mg | ORAL_CAPSULE | Freq: Once | ORAL | Status: AC
  Administered 2016-11-13: 25 mg via ORAL
  Filled 2016-11-11: qty 1

## 2016-11-11 MED ORDER — HYDROCORTISONE 2.5 % RE CREA
TOPICAL_CREAM | Freq: Two times a day (BID) | RECTAL | Status: DC
  Administered 2016-11-11 – 2016-11-15 (×9): via RECTAL
  Filled 2016-11-11: qty 28.35

## 2016-11-11 MED ORDER — SODIUM CHLORIDE 0.9% FLUSH
3.0000 mL | Freq: Two times a day (BID) | INTRAVENOUS | Status: DC
  Administered 2016-11-11 – 2016-11-13 (×4): 3 mL via INTRAVENOUS

## 2016-11-11 MED ORDER — SODIUM CHLORIDE 0.9% FLUSH: 3.0000 mL | INTRAVENOUS | Status: DC | PRN

## 2016-11-11 MED ORDER — CALCITRIOL 0.25 MCG PO CAPS
ORAL_CAPSULE | ORAL | Status: AC
  Filled 2016-11-11: qty 1

## 2016-11-11 NOTE — Progress Notes (Signed)
Russell Price Progress Note   Subjective:  "I feel fine". On HD tolerating well. 3rd tx today.   Objective Vitals:   11/11/16 0352 11/11/16 0743 11/11/16 0750 11/11/16 0806  BP: (!) 136/55 (!) 141/71 (!) 144/70 (!) 144/62  Pulse: 70 (!) 58 70 62  Resp: 16 16 16 17   Temp: 99.4 F (37.4 C) 98.9 F (37.2 C)    TempSrc: Oral Oral    SpO2: 100% 100%    Weight:  86.7 kg (191 lb 2.2 oz)    Height:       Physical Exam General: Pleasant, NAD Heart: S1,S2 RRR  Lungs: CTAB Anteriorly Abdomen:active BS Extremities: 2+ BLE edema, 2+ pedal edema.  Dialysis Access: LUA AVF + bruit bruised upper portion. RIJ TDC blood lines connected. Pressures acceptable.    Additional Objective Labs: Basic Metabolic Panel:  Recent Labs Lab 11/10/16 0801 11/10/16 1428 11/11/16 0353  NA 127* 126* 126*  K 3.9 4.0 4.0  CL 94* 93* 94*  CO2 23 23 22   GLUCOSE 87 104* 133*  BUN 72* 74* 79*  CREATININE 5.12* 5.17* 5.73*  CALCIUM 7.9* 7.8* 8.0*  PHOS 5.1* 5.1* 4.9*   Liver Function Tests:  Recent Labs Lab 11/05/16 2250  11/10/16 0801 11/10/16 1428 11/11/16 0353  AST 23  --   --   --   --   ALT 55  --   --   --   --   ALKPHOS 401*  --   --   --   --   BILITOT 1.0  --   --   --   --   PROT 6.1*  --   --   --   --   ALBUMIN 2.6*  < > 2.4* 2.4* 2.3*  < > = values in this interval not displayed. No results for input(s): LIPASE, AMYLASE in the last 168 hours. CBC:  Recent Labs Lab 11/05/16 2250 11/06/16 1639 11/07/16 0241 11/09/16 0830 11/11/16 0353  WBC 4.0 3.7* 3.7* 4.8 5.4  NEUTROABS 2.7  --  2.3  --   --   HGB 8.3* 8.4* 7.6* 7.7* 7.0*  HCT 24.1* 24.0* 22.0* 22.8* 20.4*  MCV 80.1 78.4 77.7* 80.9 81.0  PLT 171 146* 141* 143* 110*   Blood Culture    Component Value Date/Time   SDES BLOOD LEFT ARM 09/05/2015 0528   SPECREQUEST IN PEDIATRIC BOTTLE 1CC 09/05/2015 0528   CULT NO GROWTH 5 DAYS 09/05/2015 0528   REPTSTATUS 09/10/2015 FINAL 09/05/2015 0528     Cardiac Enzymes: No results for input(s): CKTOTAL, CKMB, CKMBINDEX, TROPONINI in the last 168 hours. CBG:  Recent Labs Lab 11/10/16 0726 11/10/16 1146 11/10/16 1701 11/10/16 2227 11/11/16 0406  GLUCAP 99 106* 135* 207* 139*   Iron Studies: No results for input(s): IRON, TIBC, TRANSFERRIN, FERRITIN in the last 72 hours. @lablastinr3 @ Studies/Results: No results found. Medications: . sodium chloride Stopped (11/08/16 1547)  . sodium chloride    . sodium chloride     . acetaminophen  650 mg Oral Once  . aspirin  81 mg Oral Daily  . atorvastatin  40 mg Oral q1800  . calcitRIOL  0.25 mcg Oral Daily  . carvedilol  12.5 mg Oral BID WC  . darbepoetin (ARANESP) injection - DIALYSIS  60 mcg Intravenous Q Sat-HD  . diphenhydrAMINE  25 mg Oral Once  . feeding supplement (NEPRO CARB STEADY)  237 mL Oral BID BM  . gabapentin  300 mg Oral Daily  . heparin  5,000 Units Subcutaneous Q8H  . hydrocortisone   Rectal BID  . insulin aspart  0-9 Units Subcutaneous TID WC  . insulin glargine  15 Units Subcutaneous QHS  . multivitamin  1 tablet Oral QHS     Assessment/Plan: 1. Hyponatremia: Na 126 today. Continue fld restrictions.  2. CKD V now ESRD-1st HD 11/07/16 but had issues with infiltration of AVF. TDC placed, HD 11/09/16 without issues. For HD today. CLIP in progress. Will need transportation to HD unit.  3. Anemia - HGB 7.0 Give 1 unit PRBCs with HD today.  4. Secondary hyperparathyroidism - Phos 4.9 Ca 8.0 C Ca 9.36. On daily calcitriol, no binders.  5. HTN/volume - Cont Coreg. On HD tolerating well. BP controlled. SPB 130s. UFG 2 liters. Generalized 2+ LE pitting edema.  6. Nutrition - Albumin 2.3. Renal/Carb mod diet. Nephro/renal vits 7. DM: per primary 8. H/O CAD. No chest pain.   [name] NP-C 11/11/2016, 8:39 AM  [facility] [phone]  Pt seen, examined, agree w assess/plan as above with additions as indicated. Marked vol overload with diffuse  3+ ascites and LE edema. Plan serial HD max UF.  Kelly Splinter MD [employer] pager [phone]    cell [phone] 11/11/2016, 1:36 PM

## 2016-11-11 NOTE — Clinical Social Work Note (Addendum)
CSW received call from patient's brother regarding patient and his Medicaid application. He had been informed by patient's wife that I had called and left a message. CSW confirmed call to wife to talk with her about her husband and his Medicaid application. Per brother, they are going to Social Services today to talk with Medicaid worker, [place]. CSW also requested that they speak with Medicaid worker about transportation for patient to HD. CSW also requested a call-back with update after meeting with Ms. Murchison.   Russell Price, MSW, LCSW Licensed Clinical Social Worker [place] [phone]

## 2016-11-11 NOTE — Progress Notes (Signed)
Notified MD of Hgb= 7.0 and that wife had reported some blood from rectum.  Paulla Fore

## 2016-11-11 NOTE — Progress Notes (Signed)
Russell Price  PXT:[id number] DOB: March 10, 1961 DOA: 11/06/2016 PCP: Arnoldo Morale, MD  Outpatient Specialists:     Brief Narrative:  56 y/o ? 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 the, hypertensive nephrosclerosis--now declared by nephrology  ESRD             Maturing AV fistula left arm--malfunctioning as of 6/22  TDC placed 6/ 22, right upper chest Moderately controlled diabetes-complicated by neuropathy (on gabapentin), right eye blindness             Last A1c~8             Admitted for DKA/2017 CAD with RCA ON 3 stenoses based on cath 03/04/2016             CHF EF 45 then 55 % on rpt studies              Hospitalized 4/15-4/21  and intubated for airway protection secondary to acute metabolic encephalopathy from DKA Hospitalized 9/27-10/5 for acute hemoptysis alveolar hemorrhage and? CVA Hyperlipidemia  Admitted early morning 6/20 severe hyponatremia 115, usual baseline 132, hyperkalemia 5.2 ? Volume overload secondary to oliguric renal failure Nephrology consulted, new start dialysis this admission, awaiting CLIP   Assessment & Plan:   Principal Problem:   Hyponatremia Active Problems:   Essential hypertension   DM (diabetes mellitus) with complications (HCC)   Anemia of chronic disease   ESRD (end stage renal disease) (HCC)   Fluid overload   Psychogenic polydipsia-admission sodium 115, baseline 09/02/16 was 132 CK D5--->ESRD declared this admission Renal acidosis L AV fistula placement 05/14/2016--currently non functional AVF Appreciate VVS input re: [facility] 6/22, fistulogram as per Dr. Donzetta Matters as OP Secondary hyperparathyroidism-on binders per nephrology  hypervolemic hyponatremia clinically secondary to volume overload-resolving oliguria sodium 119-->128  First episode of dialysis 6/21, tolerating subsequent dialysis well  Continue strict I/O for now, collect urine-nursing informed   daily renal panel changed to every other  day with dialysis  Dispo dependent on Medicaid authorization, no transport-family going to [facility] Medicaid office tomorrow to work out transport details  Anemia, microcytic Patient has never had a colonoscopy, hemoccult neg 6/22  nulecit x 1 given 6/22, 1 unit PRBC 6/22 External thrombosed hemorrhoid 6:00, 4:00--had some bleeding from this on this admission and needed transfusion 1 unit PRBC 11/11/2016  --if further bleeding will need GI involvement  Ferritin 1,440, Tsat low, ? RES blockade vs Functional iron def--CRP 2.4 indicative of potential RES blockade  Hemoglobin 7. repeat labs in 6/26  Thrombocytopenia mild, probably from overall iron deficiency vs HIT--  Now has low-grade thrombocyte with platelets in the 140 range.   sending HIT panel 6/25  place on SCDs 6/25 discontinue heparin  CAD-microvascular disease based on cath [id number] Chronic diastolic heart failure without acute component, EF 35% htn with complication of hypertensive glomerulosclerosis  Management will now be via dialysis  DC off of Lasix 160 bid per nephrology since 6/21, holding chlorthalidone 25 daily  Dosing of Coreg 12.5 bid per nephrologist, he has not been taking Procardia XL 90 mg for CAD  Diabetic neuropathy, nephropathy type II since last 20 years  Continue Lantus 15 units  Continue sensitive sliding scale coverage-sugars 87-207  Continue gabapentin 300 daily for neuropathy   Hyperlipidemia  continue Lipitor  Moderate malnutrition  Consider supplements with NePro, protein level is less than 3   Full CODE STATUS SCD Inpatient Disposition unclear-I've discussed with social work regarding clip  Patient  will need Medicare Medicaid transport to and from dialysis prior to discharge Long discussion with brother at bedside 6/25, 6/26   Consultants:   Nephrology  Vascular surgery  Procedures:   Dialysis  Temporary dialysis access placed in right upper chest 6/22  Antimicrobials:   None      Subjective:  clear no acute distress had some hemorrhoidal bleeding yesterday which was non-severe and now seems to resolved Seems to be getting better with the Anusol cream Asks about when he can go home No chest pain Still very swollen Some mild abdominal left lower  quadrant   Objective: Vitals:   11/11/16 1115 11/11/16 1128 11/11/16 1130 11/11/16 1156  BP: (!) 143/62 (!) 135/56 (!) 152/48 (!) 151/60  Pulse: 65 69 64 66  Resp:   17 17  Temp: 98.6 F (37 C)  98.4 F (36.9 C) 98 F (36.7 C)  TempSrc:   Oral Oral  SpO2:   99% 100%  Weight:   82.7 kg (182 lb 5.1 oz)   Height:        Intake/Output Summary (Last 24 hours) at 11/11/16 1538 Last data filed at 11/11/16 1425  Gross per 24 hour  Intake          1387.75 ml  Output             2175 ml  Net          -787.25 ml   Filed Weights   11/10/16 2233 11/11/16 0743 11/11/16 1130  Weight: 87.1 kg (192 lb) 86.7 kg (191 lb 2.2 oz) 82.7 kg (182 lb 5.1 oz)    Examination:  Scleral changesAnd postoperative changes to right eye  JVD flat S1 and S2 no murmur rub or gallop distended with mild volume, abdomen nontender nondistended -hemorrhoids not examined Grade 1 lower extremity edema, Neurologically intact  today  Data Reviewed: I have personally reviewed following labs and imaging studies  CBC:  Recent Labs Lab 11/05/16 2250 11/06/16 1639 11/07/16 0241 11/09/16 0830 11/11/16 0353  WBC 4.0 3.7* 3.7* 4.8 5.4  NEUTROABS 2.7  --  2.3  --   --   HGB 8.3* 8.4* 7.6* 7.7* 7.0*  HCT 24.1* 24.0* 22.0* 22.8* 20.4*  MCV 80.1 78.4 77.7* 80.9 81.0  PLT 171 146* 141* 143* 102*   Basic Metabolic Panel:  Recent Labs Lab 11/09/16 2022 11/10/16 0304 11/10/16 0801 11/10/16 1428 11/11/16 0353  NA 122* 128* 127* 126* 126*  K 3.7 3.8 3.9 4.0 4.0  CL 91* 95* 94* 93* 94*  CO2 22 24 23 23 22   GLUCOSE 164* 83 87 104* 133*  BUN 72* 72* 72* 74* 79*  CREATININE 4.49* 4.84* 5.12* 5.17* 5.73*  CALCIUM 7.4* 7.8* 7.9*  7.8* 8.0*  PHOS 4.5 5.0* 5.1* 5.1* 4.9*   GFR: Estimated Creatinine Clearance: 15 mL/min (A) (by C-G formula based on SCr of 5.73 mg/dL (H)). Liver Function Tests:  Recent Labs Lab 11/05/16 2250  11/09/16 2022 11/10/16 0304 11/10/16 0801 11/10/16 1428 11/11/16 0353  AST 23  --   --   --   --   --   --   ALT 55  --   --   --   --   --   --   ALKPHOS 401*  --   --   --   --   --   --   BILITOT 1.0  --   --   --   --   --   --  PROT 6.1*  --   --   --   --   --   --   ALBUMIN 2.6*  < > 2.3* 2.2* 2.4* 2.4* 2.3*  < > = values in this interval not displayed. No results for input(s): LIPASE, AMYLASE in the last 168 hours. No results for input(s): AMMONIA in the last 168 hours. Coagulation Profile: No results for input(s): INR, PROTIME in the last 168 hours. Cardiac Enzymes: No results for input(s): CKTOTAL, CKMB, CKMBINDEX, TROPONINI in the last 168 hours. BNP (last 3 results) No results for input(s): PROBNP in the last 8760 hours. HbA1C: No results for input(s): HGBA1C in the last 72 hours. CBG:  Recent Labs Lab 11/10/16 1146 11/10/16 1701 11/10/16 2227 11/11/16 0406 11/11/16 1154  GLUCAP 106* 135* 207* 139* 80   Lipid Profile: No results for input(s): CHOL, HDL, LDLCALC, TRIG, CHOLHDL, LDLDIRECT in the last 72 hours. Thyroid Function Tests: No results for input(s): TSH, T4TOTAL, FREET4, T3FREE, THYROIDAB in the last 72 hours. Anemia Panel: No results for input(s): VITAMINB12, FOLATE, FERRITIN, TIBC, IRON, RETICCTPCT in the last 72 hours. Urine analysis:    Component Value Date/Time   COLORURINE YELLOW 11/06/2016 0228   APPEARANCEUR CLEAR 11/06/2016 0228   LABSPEC 1.006 11/06/2016 0228   PHURINE 5.0 11/06/2016 0228   GLUCOSEU NEGATIVE 11/06/2016 0228   HGBUR MODERATE (A) 11/06/2016 0228   BILIRUBINUR NEGATIVE 11/06/2016 0228   BILIRUBINUR negative 02/27/2015 1458   BILIRUBINUR neg 02/22/2013 1737   KETONESUR NEGATIVE 11/06/2016 0228   PROTEINUR 30 (A)  11/06/2016 0228   UROBILINOGEN 0.2 02/28/2015 0032   NITRITE NEGATIVE 11/06/2016 0228   LEUKOCYTESUR NEGATIVE 11/06/2016 0228   Sepsis Labs: @LABRCNTIP (procalcitonin:4,lacticidven:4)  ) Recent Results (from the past 240 hour(s))  MRSA PCR Screening     Status: None   Collection Time: 11/07/16 10:15 AM  Result Value Ref Range Status   MRSA by PCR NEGATIVE NEGATIVE Final    Comment:        The GeneXpert MRSA Assay (FDA approved for NASAL specimens only), is one component of a comprehensive MRSA colonization surveillance program. It is not intended to diagnose MRSA infection nor to guide or monitor treatment for MRSA infections.   Surgical PCR screen     Status: None   Collection Time: 11/08/16  2:09 PM  Result Value Ref Range Status   MRSA, PCR NEGATIVE NEGATIVE Final   Staphylococcus aureus NEGATIVE NEGATIVE Final    Comment:        The Xpert SA Assay (FDA approved for NASAL specimens in patients over 24 years of age), is one component of a comprehensive surveillance program.  Test performance has been validated by [facility] for patients greater than or equal to 22 year old. It is not intended to diagnose infection nor to guide or monitor treatment.       Radiology Studies: No results found.   Scheduled Meds: . acetaminophen  650 mg Oral Once  . aspirin  81 mg Oral Daily  . atorvastatin  40 mg Oral q1800  . calcitRIOL      . calcitRIOL  0.25 mcg Oral Daily  . carvedilol  12.5 mg Oral BID WC  . darbepoetin (ARANESP) injection - DIALYSIS  60 mcg Intravenous Q Sat-HD  . diphenhydrAMINE  25 mg Oral Once  . feeding supplement (NEPRO CARB STEADY)  237 mL Oral BID BM  . gabapentin  300 mg Oral Daily  . heparin  5,000 Units Subcutaneous Q8H  . hydrocortisone  Rectal BID  . insulin aspart  0-9 Units Subcutaneous TID WC  . insulin glargine  15 Units Subcutaneous QHS  . multivitamin  1 tablet Oral QHS   Continuous Infusions: . sodium chloride Stopped  (11/08/16 1547)  . sodium chloride    . sodium chloride       LOS: 5 days    Time spent: [place], MD [facility]   If 7PM-7AM, please contact night-coverage [email or web address] Password [facility] 11/11/2016, 3:38 PM

## 2016-11-12 ENCOUNTER — Inpatient Hospital Stay (HOSPITAL_COMMUNITY): Payer: Medicaid Other

## 2016-11-12 LAB — CBC WITH DIFFERENTIAL/PLATELET
Basophils Absolute: 0 10*3/uL (ref 0.0–0.1)
Basophils Relative: 1 %
Eosinophils Absolute: 0.5 10*3/uL (ref 0.0–0.7)
Eosinophils Relative: 7 %
HEMATOCRIT: 23.6 % — AB (ref 39.0–52.0)
HEMOGLOBIN: 7.8 g/dL — AB (ref 13.0–17.0)
LYMPHS ABS: 0.7 10*3/uL (ref 0.7–4.0)
LYMPHS PCT: 11 %
MCH: 27.4 pg (ref 26.0–34.0)
MCHC: 33.1 g/dL (ref 30.0–36.0)
MCV: 82.8 fL (ref 78.0–100.0)
MONOS PCT: 10 %
Monocytes Absolute: 0.7 10*3/uL (ref 0.1–1.0)
NEUTROS ABS: 4.7 10*3/uL (ref 1.7–7.7)
NEUTROS PCT: 71 %
Platelets: 121 10*3/uL — ABNORMAL LOW (ref 150–400)
RBC: 2.85 MIL/uL — ABNORMAL LOW (ref 4.22–5.81)
RDW: 14 % (ref 11.5–15.5)
WBC: 6.5 10*3/uL (ref 4.0–10.5)

## 2016-11-12 LAB — TYPE AND SCREEN
ABO/RH(D): O POS
ANTIBODY SCREEN: NEGATIVE
Unit division: 0

## 2016-11-12 LAB — GLUCOSE, CAPILLARY
GLUCOSE-CAPILLARY: 142 mg/dL — AB (ref 65–99)
GLUCOSE-CAPILLARY: 148 mg/dL — AB (ref 65–99)
Glucose-Capillary: 150 mg/dL — ABNORMAL HIGH (ref 65–99)
Glucose-Capillary: 132 mg/dL — ABNORMAL HIGH (ref 65–99)

## 2016-11-12 LAB — URINALYSIS, ROUTINE W REFLEX MICROSCOPIC
BILIRUBIN URINE: NEGATIVE
Glucose, UA: 50 mg/dL — AB
KETONES UR: NEGATIVE mg/dL
NITRITE: NEGATIVE
PH: 5 (ref 5.0–8.0)
PROTEIN: 100 mg/dL — AB
Specific Gravity, Urine: 1.015 (ref 1.005–1.030)

## 2016-11-12 LAB — BPAM RBC
Blood Product Expiration Date: 201807022359
ISSUE DATE / TIME: 201806251035
UNIT TYPE AND RH: 5100

## 2016-11-12 LAB — HEPARIN INDUCED PLATELET AB (HIT ANTIBODY): Heparin Induced Plt Ab: 0.226 OD (ref 0.000–0.400)

## 2016-11-12 NOTE — Progress Notes (Signed)
PROGRESS NOTE    Russell Price  OMV:[id number] DOB: 1960/11/29 DOA: 11/06/2016 PCP: Arnoldo Morale, MD  Outpatient Specialists:     Brief Narrative:  56 y/o ? [place] CKD5 secondary to diabetic glomerulopathy the, hypertensive nephrosclerosis--now declared by nephrology  ESRD             Maturing AV fistula left arm--malfunctioning as of 6/22  TDC placed 6/ 22, right upper chest Moderately controlled diabetes-complicated by neuropathy (on gabapentin), right eye blindness             Last A1c~8             Admitted for DKA/2017 CAD with RCA ON 3 stenoses based on cath 03/04/2016             CHF EF 45 then 55 % on rpt studies              Hospitalized 4/15-4/21  and intubated for airway protection secondary to acute metabolic encephalopathy from DKA Hospitalized 9/27-10/5 for acute hemoptysis alveolar hemorrhage and? CVA Hyperlipidemia  Admitted early morning 6/20 severe hyponatremia 115, usual baseline 132, hyperkalemia 5.2 ? Volume overload secondary to oliguric renal failure Nephrology consulted, new start dialysis this admission, awaiting CLIP   Assessment & Plan:   Principal Problem:   Hyponatremia Active Problems:   Essential hypertension   DM (diabetes mellitus) with complications (HCC)   Anemia of chronic disease   ESRD (end stage renal disease) (HCC)   Fluid overload  Fever of unknown origin  Only 1 time fever 102, follow BC X 2, urine culture from 6/26  CXR is negative  If further fever without source would cover broadly with vancomycin and Zosyn and look for other causes of fever such as autoimmune etc.  I examined his temporary dialysis access in the right chest today and it looked fine  Psychogenic polydipsia-admission sodium 115, baseline 09/02/16 was 132 CK D5--->ESRD declared this admission Renal acidosis L AV fistula placement 05/14/2016--currently non functional AVF Appreciate VVS input re: [facility] 6/22, fistulogram as per Dr. Donzetta Matters as OP Secondary  hyperparathyroidism-on binders per nephrology  hypervolemic hyponatremia clinically secondary to volume overload-resolving oliguria sodium 119-->128  First episode of dialysis 6/21, tolerating subsequent dialysis well  Continue strict I/O for now, collect urine-nursing informed   daily renal panel changed to every other day with dialysis  Dispo dependent on Medicaid authorization, no transport-family going to [facility] Medicaid office tomorrow to work out transport details  Anemia, microcytic Patient has never had a colonoscopy, hemoccult neg 6/22  nulecit x 1 given 6/22, 1 unit PRBC 6/22 External thrombosed hemorrhoid 6:00, 4:00--had some bleeding from this on this admission and needed transfusion 1 unit PRBC 11/11/2016  --if further bleeding will need GI involvement [discussed with Ms Glyn Ade 6/26 who agrees  that unless further bleeding no need for inpatient workup-might need surgical input if bleeds  along with colonoscopy however]  Ferritin 1,440, Tsat low, ? RES blockade vs Functional iron def--CRP 2.4 indicative of potential RES blockade  Hemoglobin 7.0 repeat labs in 6/25--> 7.8 on 6/27  Thrombocytopenia mild, probably from overall iron deficiency vs HIT--  Now has low-grade thrombocyte with platelets in the 140 range.   sending HIT panel 6/25  place on SCDs 6/25 discontinue heparin  Seems to be resolving on its own, trend 141-110->>121  CAD-microvascular disease based on cath [id number] Chronic diastolic heart failure without acute component, EF 66% htn with complication of hypertensive glomerulosclerosis  Management will now be via dialysis  DC off of Lasix 160 bid per nephrology since 6/21, holding chlorthalidone 25 daily  Dosing of Coreg 12.5 bid per nephrologist, he has not been taking Procardia XL 90 mg for CAD  Diabetic neuropathy, nephropathy type II since last 20 years  Continue Lantus 15 units  Continue sensitive sliding scale coverage-sugars 132-150  Continue  gabapentin 300 daily for neuropathy   Hyperlipidemia  continue Lipitor  Moderate malnutrition  Consider supplements with NePro, protein level is less than 3   Full CODE STATUS SCD Inpatient Disposition unclear-I've discussed with social work regarding clip  Patient will need Medicare Medicaid transport to and from dialysis prior to discharge Long discussion with brother at bedside 6/25 Discussed with wife as well 6/26  Consultants:   Nephrology  Vascular surgery  Procedures:   Dialysis  Temporary dialysis access placed in right upper chest 6/22  Antimicrobials:   None    Subjective:  No further dark stool in fact no stools since Saturday when he had some bleeding No nausea no vomiting Noted fever and events overnight-cultures pending Eating drinking Pleasant No shortness of breath Feels a little less swollen but still overtly so   Objective: Vitals:   11/11/16 1701 11/11/16 2156 11/12/16 0607 11/12/16 0929  BP: (!) 162/66 (!) 154/61 (!) 143/59 137/67  Pulse: 72 76 64 (!) 59  Resp: 16 17 17 16   Temp: 98.9 F (37.2 C) 98.9 F (37.2 C) (!) 101.8 F (38.8 C) 98.7 F (37.1 C)  TempSrc: Oral Oral Oral Oral  SpO2: 100% 100% 100% 100%  Weight:  83.2 kg (183 lb 6.8 oz)    Height:        Intake/Output Summary (Last 24 hours) at 11/12/16 1012 Last data filed at 11/12/16 0931  Gross per 24 hour  Intake          1087.75 ml  Output             2450 ml  Net         -1362.25 ml   Filed Weights   11/11/16 0743 11/11/16 1130 11/11/16 2156  Weight: 86.7 kg (191 lb 2.2 oz) 82.7 kg (182 lb 5.1 oz) 83.2 kg (183 lb 6.8 oz)    Examination:  Scleral changes and postoperative changes to right eye  JVD flat Chest is clinically clear with no added sounds S1 and S2 grade 3/6 holosystolic murmur distended with mild volume, abdomen nontender nondistended -hemorrhoids not examined.today Grade 1 lower extremity edema, Neurologically intact   Data Reviewed: I have  personally reviewed following labs and imaging studies  CBC:  Recent Labs Lab 11/05/16 2250 11/06/16 1639 11/07/16 0241 11/09/16 0830 11/11/16 0353 11/12/16 0756  WBC 4.0 3.7* 3.7* 4.8 5.4 6.5  NEUTROABS 2.7  --  2.3  --   --  4.7  HGB 8.3* 8.4* 7.6* 7.7* 7.0* 7.8*  HCT 24.1* 24.0* 22.0* 22.8* 20.4* 23.6*  MCV 80.1 78.4 77.7* 80.9 81.0 82.8  PLT 171 146* 141* 143* 110* 485*   Basic Metabolic Panel:  Recent Labs Lab 11/09/16 2022 11/10/16 0304 11/10/16 0801 11/10/16 1428 11/11/16 0353  NA 122* 128* 127* 126* 126*  K 3.7 3.8 3.9 4.0 4.0  CL 91* 95* 94* 93* 94*  CO2 22 24 23 23 22   GLUCOSE 164* 83 87 104* 133*  BUN 72* 72* 72* 74* 79*  CREATININE 4.49* 4.84* 5.12* 5.17* 5.73*  CALCIUM 7.4* 7.8* 7.9* 7.8* 8.0*  PHOS 4.5 5.0* 5.1* 5.1* 4.9*   GFR: Estimated Creatinine Clearance:  15 mL/min (A) (by C-G formula based on SCr of 5.73 mg/dL (H)). Liver Function Tests:  Recent Labs Lab 11/05/16 2250  11/09/16 2022 11/10/16 0304 11/10/16 0801 11/10/16 1428 11/11/16 0353  AST 23  --   --   --   --   --   --   ALT 55  --   --   --   --   --   --   ALKPHOS 401*  --   --   --   --   --   --   BILITOT 1.0  --   --   --   --   --   --   PROT 6.1*  --   --   --   --   --   --   ALBUMIN 2.6*  < > 2.3* 2.2* 2.4* 2.4* 2.3*  < > = values in this interval not displayed. No results for input(s): LIPASE, AMYLASE in the last 168 hours. No results for input(s): AMMONIA in the last 168 hours. Coagulation Profile: No results for input(s): INR, PROTIME in the last 168 hours. Cardiac Enzymes: No results for input(s): CKTOTAL, CKMB, CKMBINDEX, TROPONINI in the last 168 hours. BNP (last 3 results) No results for input(s): PROBNP in the last 8760 hours. HbA1C: No results for input(s): HGBA1C in the last 72 hours. CBG:  Recent Labs Lab 11/11/16 0406 11/11/16 1154 11/11/16 1700 11/11/16 2153 11/12/16 0739  GLUCAP 139* 80 132* 110* 150*   Lipid Profile: No results for  input(s): CHOL, HDL, LDLCALC, TRIG, CHOLHDL, LDLDIRECT in the last 72 hours. Thyroid Function Tests: No results for input(s): TSH, T4TOTAL, FREET4, T3FREE, THYROIDAB in the last 72 hours. Anemia Panel: No results for input(s): VITAMINB12, FOLATE, FERRITIN, TIBC, IRON, RETICCTPCT in the last 72 hours. Urine analysis:    Component Value Date/Time   COLORURINE AMBER (A) 11/12/2016 0719   APPEARANCEUR CLOUDY (A) 11/12/2016 0719   LABSPEC 1.015 11/12/2016 0719   PHURINE 5.0 11/12/2016 0719   GLUCOSEU 50 (A) 11/12/2016 0719   HGBUR SMALL (A) 11/12/2016 0719   BILIRUBINUR NEGATIVE 11/12/2016 0719   BILIRUBINUR negative 02/27/2015 1458   BILIRUBINUR neg 02/22/2013 1737   KETONESUR NEGATIVE 11/12/2016 0719   PROTEINUR 100 (A) 11/12/2016 0719   UROBILINOGEN 0.2 02/28/2015 0032   NITRITE NEGATIVE 11/12/2016 0719   LEUKOCYTESUR SMALL (A) 11/12/2016 0719   Sepsis Labs: @LABRCNTIP (procalcitonin:4,lacticidven:4)  ) Recent Results (from the past 240 hour(s))  MRSA PCR Screening     Status: None   Collection Time: 11/07/16 10:15 AM  Result Value Ref Range Status   MRSA by PCR NEGATIVE NEGATIVE Final    Comment:        The GeneXpert MRSA Assay (FDA approved for NASAL specimens only), is one component of a comprehensive MRSA colonization surveillance program. It is not intended to diagnose MRSA infection nor to guide or monitor treatment for MRSA infections.   Surgical PCR screen     Status: None   Collection Time: 11/08/16  2:09 PM  Result Value Ref Range Status   MRSA, PCR NEGATIVE NEGATIVE Final   Staphylococcus aureus NEGATIVE NEGATIVE Final    Comment:        The Xpert SA Assay (FDA approved for NASAL specimens in patients over 31 years of age), is one component of a comprehensive surveillance program.  Test performance has been validated by [facility] for patients greater than or equal to 21 year old. It is not intended to diagnose infection  nor to guide or monitor  treatment.       Radiology Studies: Dg Chest Port 1 View  Result Date: 11/12/2016 CLINICAL DATA:  Fevers EXAM: PORTABLE CHEST 1 VIEW COMPARISON:  11/08/2016 FINDINGS: Cardiac shadow remains enlarged. Dialysis catheter is again noted in satisfactory position. Interval clearing in the bases bilaterally is noted. No focal confluent infiltrate or sizable effusion is seen. No bony abnormality is noted. IMPRESSION: Improved aeration in the bases when compared with the prior exam Electronically Signed   By: Inez Catalina M.D.   On: 11/12/2016 07:19     Scheduled Meds: . acetaminophen  650 mg Oral Once  . aspirin  81 mg Oral Daily  . atorvastatin  40 mg Oral q1800  . calcitRIOL  0.25 mcg Oral Daily  . carvedilol  12.5 mg Oral BID WC  . darbepoetin (ARANESP) injection - DIALYSIS  60 mcg Intravenous Q Sat-HD  . diphenhydrAMINE  25 mg Oral Once  . feeding supplement (NEPRO CARB STEADY)  237 mL Oral BID BM  . gabapentin  300 mg Oral Daily  . hydrocortisone   Rectal BID  . insulin aspart  0-9 Units Subcutaneous TID WC  . insulin glargine  15 Units Subcutaneous QHS  . multivitamin  1 tablet Oral QHS  . sodium chloride flush  3 mL Intravenous Q12H   Continuous Infusions: . sodium chloride Stopped (11/08/16 1547)  . sodium chloride    . sodium chloride    . sodium chloride       LOS: 6 days    Time spent: [place], MD [facility]   If 7PM-7AM, please contact night-coverage [email or web address] Password [email or web address] 11/12/2016, 10:12 AM

## 2016-11-12 NOTE — Progress Notes (Signed)
[facility] Progress Note   Subjective:  Lying in bed, no complaints. Wife at bedside.   Objective Vitals:   11/11/16 1701 11/11/16 2156 11/12/16 0607 11/12/16 0929  BP: (!) 162/66 (!) 154/61 (!) 143/59 137/67  Pulse: 72 76 64 (!) 59  Resp: 16 17 17 16   Temp: 98.9 F (37.2 C) 98.9 F (37.2 C) (!) 101.8 F (38.8 C) 98.7 F (37.1 C)  TempSrc: Oral Oral Oral Oral  SpO2: 100% 100% 100% 100%  Weight:  83.2 kg (183 lb 6.8 oz)    Height:       Physical Exam General: NAD Heart: S1,S2 RRR Lungs: CTAB A/P Abdomen: soft, questionable ascites present.  Extremities: 2+ BLE edema 2+ Pedal edema.  Dialysis Access: LUA AVF + bruit bruised area softer. RIJ TDC drsg CDI.    Additional Objective Labs: Basic Metabolic Panel:  Recent Labs Lab 11/10/16 0801 11/10/16 1428 11/11/16 0353  NA 127* 126* 126*  K 3.9 4.0 4.0  CL 94* 93* 94*  CO2 23 23 22   GLUCOSE 87 104* 133*  BUN 72* 74* 79*  CREATININE 5.12* 5.17* 5.73*  CALCIUM 7.9* 7.8* 8.0*  PHOS 5.1* 5.1* 4.9*   Liver Function Tests:  Recent Labs Lab 11/05/16 2250  11/10/16 0801 11/10/16 1428 11/11/16 0353  AST 23  --   --   --   --   ALT 55  --   --   --   --   ALKPHOS 401*  --   --   --   --   BILITOT 1.0  --   --   --   --   PROT 6.1*  --   --   --   --   ALBUMIN 2.6*  < > 2.4* 2.4* 2.3*  < > = values in this interval not displayed. No results for input(s): LIPASE, AMYLASE in the last 168 hours. CBC:  Recent Labs Lab 11/05/16 2250 11/06/16 1639 11/07/16 0241 11/09/16 0830 11/11/16 0353 11/12/16 0756  WBC 4.0 3.7* 3.7* 4.8 5.4 6.5  NEUTROABS 2.7  --  2.3  --   --  4.7  HGB 8.3* 8.4* 7.6* 7.7* 7.0* 7.8*  HCT 24.1* 24.0* 22.0* 22.8* 20.4* 23.6*  MCV 80.1 78.4 77.7* 80.9 81.0 82.8  PLT 171 146* 141* 143* 110* 121*   Blood Culture    Component Value Date/Time   SDES BLOOD LEFT ARM 09/05/2015 0528   SPECREQUEST IN PEDIATRIC BOTTLE 1CC 09/05/2015 0528   CULT NO GROWTH 5 DAYS 09/05/2015 0528    REPTSTATUS 09/10/2015 FINAL 09/05/2015 0528    Cardiac Enzymes: No results for input(s): CKTOTAL, CKMB, CKMBINDEX, TROPONINI in the last 168 hours. CBG:  Recent Labs Lab 11/11/16 0406 11/11/16 1154 11/11/16 1700 11/11/16 2153 11/12/16 0739  GLUCAP 139* 80 132* 110* 150*    Studies/Results: Dg Chest Port 1 View  Result Date: 11/12/2016 CLINICAL DATA:  Fevers EXAM: PORTABLE CHEST 1 VIEW COMPARISON:  11/08/2016 FINDINGS: Cardiac shadow remains enlarged. Dialysis catheter is again noted in satisfactory position. Interval clearing in the bases bilaterally is noted. No focal confluent infiltrate or sizable effusion is seen. No bony abnormality is noted. IMPRESSION: Improved aeration in the bases when compared with the prior exam Electronically Signed   By: Inez Catalina M.D.   On: 11/12/2016 07:19   Medications: . sodium chloride Stopped (11/08/16 1547)  . sodium chloride    . sodium chloride    . sodium chloride     . acetaminophen  650 mg Oral Once  . aspirin  81 mg Oral Daily  . atorvastatin  40 mg Oral q1800  . calcitRIOL  0.25 mcg Oral Daily  . carvedilol  12.5 mg Oral BID WC  . darbepoetin (ARANESP) injection - DIALYSIS  60 mcg Intravenous Q Sat-HD  . diphenhydrAMINE  25 mg Oral Once  . feeding supplement (NEPRO CARB STEADY)  237 mL Oral BID BM  . gabapentin  300 mg Oral Daily  . hydrocortisone   Rectal BID  . insulin aspart  0-9 Units Subcutaneous TID WC  . insulin glargine  15 Units Subcutaneous QHS  . multivitamin  1 tablet Oral QHS  . sodium chloride flush  3 mL Intravenous Q12H   HD Orders: New pt-no established orders yet.   Assessment/Plan: 1. Hyponatremia: Na 126 today. Continue fld restrictions.  2. CKD V now ESRD-1st HD 11/07/16 but had issues with infiltration of AVF. TDC placed, HD 11/09/16 without issues. For HD today. CLIP in progress. Will need transportation to HD unit.  3. Anemia - HGB 7.0 11/11/16  Rec'd 1 unit PRBCs with HD yesterday. HGB 7.8   Rec'd 60 mcg 11/09/16. Follow HGB.  4. Secondary hyperparathyroidism - Phos 4.9 Ca 8.0 C Ca 9.36. On daily calcitriol, no binders.  5. HTN/volume - Cont Coreg. BP higher today. Will have HD again today, continue volume removal. HD 11/11/16 Pre wt 86.7 kg Net UF 2.1 liters Post wt 82.7 kg.  Attempt UFG 2-3 liters.  6. Nutrition - Albumin 2.3. Renal/Carb mod diet. Nephro/renal vits 7. DM: per primary 8. H/O CAD. No chest pain 9. Possible HIT: HIT panel pending PLT 121  no heparin.   [name] NP-C 11/12/2016, 9:54 AM  [facility] [phone]  Pt seen, examined and agree w A/P as above.  Kelly Splinter MD [employer] pager [phone]   11/12/2016, 3:35 PM

## 2016-11-12 NOTE — Progress Notes (Signed)
Patient with temp 101.8,tylenol given. MD on call text paged. Order received. Will continue to monitor. Carlo Guevarra, Wonda Cheng, Therapist, sports

## 2016-11-12 NOTE — Anesthesia Postprocedure Evaluation (Signed)
Anesthesia Post Note  Patient: Russell Price  Procedure(s) Performed: Procedure(s) (LRB): INSERTION OF DIALYSIS CATHETER (Right)     Patient location during evaluation: PACU Anesthesia Type: MAC Level of consciousness: awake and alert Pain management: pain level controlled Vital Signs Assessment: post-procedure vital signs reviewed and stable Respiratory status: spontaneous breathing, nonlabored ventilation, respiratory function stable and patient connected to nasal cannula oxygen Cardiovascular status: stable and blood pressure returned to baseline Anesthetic complications: no    Last Vitals:  Vitals:   11/12/16 0929 11/12/16 1655  BP: 137/67 (!) 171/69  Pulse: (!) 59 72  Resp: 16 16  Temp: 37.1 C 37 C    Last Pain:  Vitals:   11/12/16 1655  TempSrc: Oral  PainSc:    Pain Goal:                 [name]

## 2016-11-13 ENCOUNTER — Encounter (HOSPITAL_COMMUNITY): Payer: Self-pay | Admitting: Student

## 2016-11-13 ENCOUNTER — Inpatient Hospital Stay (HOSPITAL_COMMUNITY): Payer: Medicaid Other

## 2016-11-13 DIAGNOSIS — N186 End stage renal disease: Secondary | ICD-10-CM

## 2016-11-13 DIAGNOSIS — E118 Type 2 diabetes mellitus with unspecified complications: Secondary | ICD-10-CM

## 2016-11-13 HISTORY — PX: IR PARACENTESIS: IMG2679

## 2016-11-13 LAB — BODY FLUID CELL COUNT WITH DIFFERENTIAL
Eos, Fluid: 2 %
Lymphs, Fluid: 27 %
Monocyte-Macrophage-Serous Fluid: 67 % (ref 50–90)
Neutrophil Count, Fluid: 4 % (ref 0–25)
Total Nucleated Cell Count, Fluid: 287 uL (ref 0–1000)

## 2016-11-13 LAB — RENAL FUNCTION PANEL
Albumin: 2.3 g/dL — ABNORMAL LOW (ref 3.5–5.0)
Anion gap: 8 (ref 5–15)
BUN: 60 mg/dL — AB (ref 6–20)
CHLORIDE: 96 mmol/L — AB (ref 101–111)
CO2: 26 mmol/L (ref 22–32)
CREATININE: 5.31 mg/dL — AB (ref 0.61–1.24)
Calcium: 8.1 mg/dL — ABNORMAL LOW (ref 8.9–10.3)
GFR calc Af Amer: 12 mL/min — ABNORMAL LOW (ref >60)
GFR, EST NON AFRICAN AMERICAN: 11 mL/min — AB (ref >60)
Glucose, Bld: 90 mg/dL (ref 65–99)
Phosphorus: 4.2 mg/dL (ref 2.5–4.6)
Potassium: 3.6 mmol/L (ref 3.5–5.1)
Sodium: 130 mmol/L — ABNORMAL LOW (ref 135–145)

## 2016-11-13 LAB — GRAM STAIN

## 2016-11-13 LAB — CBC
HCT: 24.5 % — ABNORMAL LOW (ref 39.0–52.0)
Hemoglobin: 8 g/dL — ABNORMAL LOW (ref 13.0–17.0)
MCH: 27.7 pg (ref 26.0–34.0)
MCHC: 32.7 g/dL (ref 30.0–36.0)
MCV: 84.8 fL (ref 78.0–100.0)
PLATELETS: 145 10*3/uL — AB (ref 150–400)
RBC: 2.89 MIL/uL — ABNORMAL LOW (ref 4.22–5.81)
RDW: 14.2 % (ref 11.5–15.5)
WBC: 6.3 10*3/uL (ref 4.0–10.5)

## 2016-11-13 LAB — PROTEIN, PLEURAL OR PERITONEAL FLUID: Total protein, fluid: 4.3 g/dL

## 2016-11-13 LAB — GLUCOSE, CAPILLARY
GLUCOSE-CAPILLARY: 198 mg/dL — AB (ref 65–99)
Glucose-Capillary: 34 mg/dL — CL (ref 65–99)
Glucose-Capillary: 165 mg/dL — ABNORMAL HIGH (ref 65–99)
Glucose-Capillary: 216 mg/dL — ABNORMAL HIGH (ref 65–99)
Glucose-Capillary: 190 mg/dL — ABNORMAL HIGH (ref 65–99)

## 2016-11-13 LAB — LACTATE DEHYDROGENASE, PLEURAL OR PERITONEAL FLUID: LD, Fluid: 127 U/L — ABNORMAL HIGH (ref 3–23)

## 2016-11-13 LAB — ALBUMIN, PLEURAL OR PERITONEAL FLUID: Albumin, Fluid: 2 g/dL

## 2016-11-13 MED ORDER — LIDOCAINE HCL (PF) 1 % IJ SOLN
INTRAMUSCULAR | Status: AC
  Administered 2016-11-13: 15:00:00
  Filled 2016-11-13: qty 30

## 2016-11-13 MED ORDER — PENTAFLUOROPROP-TETRAFLUOROETH EX AERO: 1.0000 "application " | INHALATION_SPRAY | CUTANEOUS | Status: DC | PRN

## 2016-11-13 MED ORDER — ALTEPLASE 2 MG IJ SOLR: 2.0000 mg | Freq: Once | INTRAMUSCULAR | Status: DC | PRN

## 2016-11-13 MED ORDER — LIDOCAINE HCL (PF) 1 % IJ SOLN: 5.0000 mL | INTRAMUSCULAR | Status: DC | PRN

## 2016-11-13 MED ORDER — LIDOCAINE-PRILOCAINE 2.5-2.5 % EX CREA: 1.0000 "application " | TOPICAL_CREAM | CUTANEOUS | Status: DC | PRN

## 2016-11-13 MED ORDER — SODIUM CHLORIDE 0.9 % IV SOLN: 100.0000 mL | INTRAVENOUS | Status: DC | PRN

## 2016-11-13 MED ORDER — ANTICOAGULANT SODIUM CITRATE 4% (200MG/5ML) IV SOLN
5.0000 mL | Freq: Once | Status: AC
  Administered 2016-11-13: 5 mL via INTRAVENOUS
  Filled 2016-11-13: qty 250

## 2016-11-13 MED ORDER — DEXTROSE 50 % IV SOLN
INTRAVENOUS | Status: AC
  Administered 2016-11-13: 50 mL
  Filled 2016-11-13: qty 50

## 2016-11-13 MED ORDER — LIDOCAINE HCL (PF) 1 % IJ SOLN
INTRAMUSCULAR | Status: DC | PRN
  Administered 2016-11-13: 10 mL

## 2016-11-13 NOTE — Progress Notes (Signed)
HD tx initiated via HD cath w/o problem, pull/push/flush w/o problem, VSS, will cont to monitor while on HD tx

## 2016-11-13 NOTE — Progress Notes (Signed)
Inpatient Diabetes Program Recommendations  AACE/ADA: New Consensus Statement on Inpatient Glycemic Control (2015)  Target Ranges:  Prepandial:   less than 140 mg/dL      Peak postprandial:   less than 180 mg/dL (1-2 hours)      Critically ill patients:  140 - 180 mg/dL   Results for MEAGAN, SPEASE (MRN [id number]) as of 11/13/2016 10:24  Ref. Range 11/13/2016 07:55  Glucose-Capillary Latest Ref Range: 65 - 99 mg/dL 34 (LL)    Admit with: Hyponatremia/ Hyperkalemia  History: DM, ESRD, CVA  Home DM Meds: Lantus 15 units QHS       Novolog 2-10 units TID  Current Insulin Orders: Lantus 15 units QHS        Novolog Sensitive Correction Scale/ SSI (0-9 units) TID AC      MD- Note patient Hypoglycemic this AM: CBG down to 34 mg/dl.  Please consider reducing Lantus to 12 units QHS      --Will follow patient during hospitalization--  Wyn Quaker RN, MSN, CDE Diabetes Coordinator Inpatient Glycemic Control Team Team Pager: [phone] (8a-5p)

## 2016-11-13 NOTE — Progress Notes (Signed)
[facility] Progress Note   Subjective: Feeling better. Less edema but ascites unchanged. Lying flat without C/O SOB. No new complaints.   Objective Vitals:   11/13/16 0630 11/13/16 0700 11/13/16 0710 11/13/16 0912  BP: (!) 155/67 137/70 (!) 147/69 (!) 154/62  Pulse: 60 (!) 59 61 66  Resp: 16 18 18 18   Temp:   97.8 F (36.6 C) 98.3 F (36.8 C)  TempSrc:   Oral Oral  SpO2:   100% 100%  Weight:   80.7 kg (177 lb 14.6 oz)   Height:       Physical Exam General: Pleasant, NAD Heart: S1,S2, RRR Lungs: CTAB A/P sl decreased in bases Abdomen: distended, probable ascites present Extremities: 1+ edema BLE and ankles, trace pedal edema Dialysis Access: LUA AVF + bruit RIJ TDC drsg CDI.   Additional Objective Labs: Basic Metabolic Panel:  Recent Labs Lab 11/10/16 1428 11/11/16 0353 11/13/16 0237  NA 126* 126* 130*  K 4.0 4.0 3.6  CL 93* 94* 96*  CO2 23 22 26   GLUCOSE 104* 133* 90  BUN 74* 79* 60*  CREATININE 5.17* 5.73* 5.31*  CALCIUM 7.8* 8.0* 8.1*  PHOS 5.1* 4.9* 4.2   Liver Function Tests:  Recent Labs Lab 11/10/16 1428 11/11/16 0353 11/13/16 0237  ALBUMIN 2.4* 2.3* 2.3*   No results for input(s): LIPASE, AMYLASE in the last 168 hours. CBC:  Recent Labs Lab 11/07/16 0241 11/09/16 0830 11/11/16 0353 11/12/16 0756 11/13/16 0237  WBC 3.7* 4.8 5.4 6.5 6.3  NEUTROABS 2.3  --   --  4.7  --   HGB 7.6* 7.7* 7.0* 7.8* 8.0*  HCT 22.0* 22.8* 20.4* 23.6* 24.5*  MCV 77.7* 80.9 81.0 82.8 84.8  PLT 141* 143* 110* 121* 145*   Blood Culture    Component Value Date/Time   SDES BLOOD LEFT ARM 09/05/2015 0528   SPECREQUEST IN PEDIATRIC BOTTLE 1CC 09/05/2015 0528   CULT NO GROWTH 5 DAYS 09/05/2015 0528   REPTSTATUS 09/10/2015 FINAL 09/05/2015 0528    Cardiac Enzymes: No results for input(s): CKTOTAL, CKMB, CKMBINDEX, TROPONINI in the last 168 hours. CBG:  Recent Labs Lab 11/12/16 1142 11/12/16 1654 11/12/16 2132 11/13/16 0755 11/13/16 0834   GLUCAP 132* 142* 148* 34* 165*   Iron Studies: No results for input(s): IRON, TIBC, TRANSFERRIN, FERRITIN in the last 72 hours. @lablastinr3 @ Studies/Results: Dg Chest Port 1 View  Result Date: 11/12/2016 CLINICAL DATA:  Fevers EXAM: PORTABLE CHEST 1 VIEW COMPARISON:  11/08/2016 FINDINGS: Cardiac shadow remains enlarged. Dialysis catheter is again noted in satisfactory position. Interval clearing in the bases bilaterally is noted. No focal confluent infiltrate or sizable effusion is seen. No bony abnormality is noted. IMPRESSION: Improved aeration in the bases when compared with the prior exam Electronically Signed   By: Inez Catalina M.D.   On: 11/12/2016 07:19   Medications: . sodium chloride Stopped (11/08/16 1547)  . sodium chloride    . sodium chloride    . sodium chloride     . aspirin  81 mg Oral Daily  . atorvastatin  40 mg Oral q1800  . calcitRIOL  0.25 mcg Oral Daily  . carvedilol  12.5 mg Oral BID WC  . darbepoetin (ARANESP) injection - DIALYSIS  60 mcg Intravenous Q Sat-HD  . feeding supplement (NEPRO CARB STEADY)  237 mL Oral BID BM  . gabapentin  300 mg Oral Daily  . hydrocortisone   Rectal BID  . insulin aspart  0-9 Units Subcutaneous TID WC  . insulin  glargine  15 Units Subcutaneous QHS  . multivitamin  1 tablet Oral QHS  . sodium chloride flush  3 mL Intravenous Q12H     HD Orders: New pt-no established orders yet.   Assessment/Plan: 1. CKD V now ESRD-1st HD 11/07/16 but had issues with infiltration of AVF. TDC placed. CLIP in progress, no insurance so this will delay any discharge. Will need transportation to HD unit as well.  2.  Vol overload / ascites - LE edema better but still large ascites. Have requested IR to see for paracentesis.  3. Anemia - HGB 7.0 11/11/16  Rec'd 1 unit PRBCs with HD yesterday. HGB 8.0. Follow HGB. Cont weekly ESA. 4. Secondary hyperparathyroidism - Phos 4.2 Ca 8.0 C Ca 9.36. On daily calcitriol, no binders.  5. HTN -  Cont  Coreg 6. Nutrition - Albumin 2.3. Renal/Carb mod diet. Nephro/renal vits 7. DM: per primary 8. H/O CAD. No chest pain 9. Possible HIT: HIT panel done 0.226 PLT ^ 145. Per primary.    [name] NP-C 11/13/2016, 9:36 AM  [facility] [phone]  Pt seen, examined and agree w A/P as above.  Kelly Splinter MD [employer] pager [phone]   11/13/2016, 12:41 PM

## 2016-11-13 NOTE — Clinical Social Work Note (Signed)
CSW talked with patient and wife regarding dialysis transportation and household needs. Wife has not been working regularly since husband has been in the hospital and does not have money for all of the bills (husband's disability pays the rent per wife) and this was discussed. Patient and wife informed that CSW will meet with them on Thursday to complete SCAT application.   Patient and wife also advised that CSW attempted to reach [facility] worker today and message left. CSW will follow-up with patient/wife on 6/28 to complete and submit SCAT application, and call will be made to [facility] worker again on Thursday.  Vashawn Ekstein Givens, MSW, LCSW Licensed Clinical Social Worker [place] [phone]

## 2016-11-13 NOTE — Progress Notes (Signed)
CRITICAL VALUE ALERT  Critical Value:  CBG  34  Date & Time Notied:  11/13/2016 @ 4076KG  Provider Notified: Dr Maudie Mercury  Orders Received/Actions taken:  yes

## 2016-11-13 NOTE — Procedures (Signed)
PROCEDURE SUMMARY:  Successful US guided paracentesis from right lateral abdomen.  Yielded 5.0 liters of yellow fluid.  No immediate complications.  Pt tolerated well.   Specimen was sent for labs.  Docia Barrier PA-C 11/13/2016 3:27 PM

## 2016-11-13 NOTE — Progress Notes (Addendum)
Patient ID: Russell Price, male   DOB: 1961/01/17, 56 y.o.   MRN: [id number]                                                                PROGRESS NOTE                                                                                                                                                                                                             Patient Demographics:    Russell Price, is a 56 y.o. male, DOB - 1961/04/15, MGQ:[id number]  Admit date - 11/06/2016   Admitting Physician Norval Morton, MD  Outpatient Primary MD for the patient is Arnoldo Morale, MD  LOS - 7  Outpatient Specialists:      Chief Complaint  Patient presents with  . 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 CVA Hyperlipidemia  Admitted early morning 6/20 severe hyponatremia 115, usual baseline 132, hyperkalemia 5.2 ? Volume overload secondary to oliguric renal failure Nephrology consulted, new start dialysis this admission, awaiting CLIP    Subjective:    Russell Price today is currently undergoing HD.  , No headache, No chest pain, No abdominal pain - No Nausea, No new weakness tingling or numbness, No Cough - SOB.    Assessment  & Plan :    Principal Problem:   Hyponatremia Active Problems:   Essential hypertension   DM  (diabetes mellitus) with complications (HCC)   Anemia of chronic disease   ESRD (end stage renal disease) (HCC)   Fluid overload    Psychogenic polydipsia-admission sodium 115, baseline 09/02/16 was 132 CK D5--->ESRD declared this admission  Renal acidosis L AV fistula placement 05/14/2016--currently non functional AVF  Appreciate VVS input re: [facility] 6/22, fistulogram as per Dr. Donzetta Matters as OP Secondary hyperparathyroidism-on binders per nephrology  hypervolemic hyponatremia clinically secondary to volume overload-resolving oliguria sodium 119-->128             First episode of dialysis 6/21, tolerating subsequent dialysis well             Continue strict I/O for now, collect urine-nursing informed              daily renal panel changed to every other day with dialysis             Dispo dependent on Medicaid authorization, no transport-family going to [facility] Medicaid office tomorrow to work out transport details AWAITING CLIP  Fever of unknown origin             Only 1 time fever 102, follow BC X 2, urine culture from 6/26             CXR is negative             If further fever without source would cover broadly with vancomycin and Zosyn and look for other causes of fever such as autoimmune etc. Afebrile this am  Anemia, microcytic Patient has never had a colonoscopy, hemoccult neg 6/22             nulecit x 1 given 6/22, 1 unit PRBC 6/22 External thrombosed hemorrhoid 6:00, 4:00--had some bleeding from this on this admission and needed transfusion 1 unit PRBC 11/11/2016             --if further bleeding will need GI involvement [discussed with Ms Glyn Ade 6/26 who agrees    that unless further bleeding no need for inpatient workup-might need surgical input if bleeds           along with colonoscopy however]             Ferritin 1,440, Tsat low, ? RES blockade vs Functional iron def--CRP 2.4 indicative of potential RES blockade             Hemoglobin 7.0 repeat labs in 6/25--> 7.8 on 6/27   Hgb 8.0  Thrombocytopenia mild, probably from overall iron deficiency vs HIT--              Now has low-grade thrombocyte with platelets in the 140 range.              sending HIT panel 6/25 (negative)             place on SCDs 6/25 discontinue heparin             Seems to be resolving on its own, trend 141-110->>121  CAD-microvascular disease based on cath [id number] Chronic diastolic heart failure without acute component, EF 72% htn with complication of hypertensive glomerulosclerosis             Management will now be via dialysis             DC off of Lasix 160 bid per nephrology since 6/21, holding chlorthalidone 25 daily             Dosing of Coreg 12.5 bid per nephrologist, he has not been taking Procardia XL 90 mg for CAD  Diabetic neuropathy, nephropathy type II since last 20 years             Continue Lantus 15 units             Continue sensitive sliding scale  coverage-sugars 132-150             Continue gabapentin 300 daily for neuropathy              Hyperlipidemia             continue Lipitor  Moderate malnutrition             Consider supplements with NePro, protein level is less than 3           Full CODE STATUS SCD Inpatient Disposition unclear-I've discussed with social work regarding clip  Patient will need Medicare Medicaid transport to and from dialysis prior to discharge Long discussion with brother at bedside 6/25 Discussed with wife as well 6/26  Consultants:   Nephrology  Vascular surgery  Procedures:   Dialysis  Temporary dialysis access placed in right upper chest 6/22  Antimicrobials:  None   Lab Results  Component Value Date   PLT 145 (L) 11/13/2016      Anti-infectives    Start     Dose/Rate Route Frequency Ordered Stop   11/08/16 1500  ceFAZolin (ANCEF) IVPB 1 g/50 mL premix     1 g 100 mL/hr over 30 Minutes Intravenous  Once 11/08/16 1457 11/08/16 1514   11/08/16 1458  ceFAZolin (ANCEF) 1-4 GM/50ML-% IVPB      Comments:  [name]   : cabinet override      11/08/16 1458 11/08/16 1514        Objective:   Vitals:   11/13/16 0340 11/13/16 0400 11/13/16 0430 11/13/16 0500  BP: 138/67 134/67 (!) 142/64 (!) 152/69  Pulse: 63 61 61 62  Resp: 15 14 12 13   Temp:      TempSrc:      SpO2: 99% 99% 100% 100%  Weight:      Height:        Wt Readings from Last 3 Encounters:  11/13/16 84.2 kg (185 lb 10 oz)  09/12/16 78.9 kg (174 lb)  09/02/16 87.5 kg (193 lb)     Intake/Output Summary (Last 24 hours) at 11/13/16 0520 Last data filed at 11/13/16 0200  Gross per 24 hour  Intake              776 ml  Output              350 ml  Net              426 ml     Physical Exam  Awake Alert, Oriented X 3, No new F.N deficits, Normal affect [place].AT,PERRAL Supple Neck,No JVD, No cervical lymphadenopathy appriciated.  Symmetrical Chest wall movement, Good air movement bilaterally, CTAB RRR,No Gallops,Rubs or new Murmurs, No Parasternal Heave +ve B.Sounds, Abd Soft, No tenderness, No organomegaly appriciated, No rebound - guarding or rigidity. No Cyanosis, Clubbing or edema, No new Rash or bruise      Data Review:    CBC  Recent Labs Lab 11/07/16 0241 11/09/16 0830 11/11/16 0353 11/12/16 0756 11/13/16 0237  WBC 3.7* 4.8 5.4 6.5 6.3  HGB 7.6* 7.7* 7.0* 7.8* 8.0*  HCT 22.0* 22.8* 20.4* 23.6* 24.5*  PLT 141* 143* 110* 121* 145*  MCV 77.7* 80.9 81.0 82.8 84.8  MCH 26.9 27.3 27.8 27.4 27.7  MCHC 34.5 33.8 34.3 33.1 32.7  RDW 12.7 13.4 13.5 14.0 14.2  LYMPHSABS 0.7  --   --  0.7  --   MONOABS 0.3  --   --  0.7  --   EOSABS 0.4  --   --  0.5  --   BASOSABS 0.0  --   --  0.0  --     Chemistries   Recent Labs Lab 11/10/16 0304 11/10/16 0801 11/10/16 1428 11/11/16 0353 11/13/16 0237  NA 128* 127* 126* 126* 130*  K 3.8 3.9 4.0 4.0 3.6  CL 95* 94* 93* 94* 96*  CO2 24 23 23 22 26   GLUCOSE 83 87 104* 133* 90  BUN 72* 72* 74* 79* 60*  CREATININE 4.84* 5.12* 5.17* 5.73* 5.31*   CALCIUM 7.8* 7.9* 7.8* 8.0* 8.1*   ------------------------------------------------------------------------------------------------------------------ No results for input(s): CHOL, HDL, LDLCALC, TRIG, CHOLHDL, LDLDIRECT in the last 72 hours.  Lab Results  Component Value Date   HGBA1C 7.4 09/02/2016   ------------------------------------------------------------------------------------------------------------------ No results for input(s): TSH, T4TOTAL, T3FREE, THYROIDAB in the last 72 hours.  Invalid input(s): FREET3 ------------------------------------------------------------------------------------------------------------------ No results for input(s): VITAMINB12, FOLATE, FERRITIN, TIBC, IRON, RETICCTPCT in the last 72 hours.  Coagulation profile No results for input(s): INR, PROTIME in the last 168 hours.  No results for input(s): DDIMER in the last 72 hours.  Cardiac Enzymes No results for input(s): CKMB, TROPONINI, MYOGLOBIN in the last 168 hours.  Invalid input(s): CK ------------------------------------------------------------------------------------------------------------------    Component Value Date/Time   BNP 1,114.8 (H) 02/27/2015 1452    Inpatient Medications  Scheduled Meds: . acetaminophen  650 mg Oral Once  . aspirin  81 mg Oral Daily  . atorvastatin  40 mg Oral q1800  . calcitRIOL  0.25 mcg Oral Daily  . carvedilol  12.5 mg Oral BID WC  . darbepoetin (ARANESP) injection - DIALYSIS  60 mcg Intravenous Q Sat-HD  . diphenhydrAMINE  25 mg Oral Once  . feeding supplement (NEPRO CARB STEADY)  237 mL Oral BID BM  . gabapentin  300 mg Oral Daily  . hydrocortisone   Rectal BID  . insulin aspart  0-9 Units Subcutaneous TID WC  . insulin glargine  15 Units Subcutaneous QHS  . multivitamin  1 tablet Oral QHS  . sodium chloride flush  3 mL Intravenous Q12H   Continuous Infusions: . sodium chloride Stopped (11/08/16 1547)  . sodium chloride    . sodium  chloride    . sodium chloride    . sodium chloride    . sodium chloride    . anticoagulant sodium citrate     PRN Meds:.sodium chloride, sodium chloride, sodium chloride, sodium chloride, sodium chloride, acetaminophen **OR** acetaminophen, alteplase, ipratropium-albuterol, lidocaine (PF), lidocaine-prilocaine, ondansetron **OR** ondansetron (ZOFRAN) IV, pentafluoroprop-tetrafluoroeth, sodium chloride flush  Micro Results Recent Results (from the past 240 hour(s))  MRSA PCR Screening     Status: None   Collection Time: 11/07/16 10:15 AM  Result Value Ref Range Status   MRSA by PCR NEGATIVE NEGATIVE Final    Comment:        The GeneXpert MRSA Assay (FDA approved for NASAL specimens only), is one component of a comprehensive MRSA colonization surveillance program. It is not intended to diagnose MRSA infection nor to guide or monitor treatment for MRSA infections.   Surgical PCR screen     Status: None   Collection Time: 11/08/16  2:09 PM  Result Value Ref Range Status   MRSA, PCR NEGATIVE NEGATIVE Final   Staphylococcus aureus NEGATIVE NEGATIVE Final    Comment:        The Xpert SA Assay (FDA approved for NASAL specimens in patients over 6 years of age), is one component of a comprehensive surveillance program.  Test performance has been validated by [facility] for patients greater than or equal to 44 year old. It is not intended to diagnose infection nor to guide or monitor treatment.     Radiology Reports Dg Chest Port 1 View  Result Date: 11/12/2016 CLINICAL DATA:  Fevers EXAM: PORTABLE CHEST 1 VIEW COMPARISON:  11/08/2016 FINDINGS: Cardiac shadow remains enlarged. Dialysis catheter is again noted in satisfactory position. Interval clearing in the bases bilaterally is noted. No focal confluent infiltrate or sizable effusion is seen. No bony abnormality is noted. IMPRESSION: Improved aeration in the bases when compared with the prior exam Electronically Signed   By:  Inez Catalina M.D.   On: 11/12/2016 07:19   Dg Chest Port 1 View  Result Date: 11/08/2016 CLINICAL DATA:  Central catheter placement EXAM: PORTABLE CHEST 1 VIEW COMPARISON:  November 06, 2016 FINDINGS: Central catheter tip is in the right atrium approximately 5 cm distal to the superior vena cava -right atrium junction. No pneumothorax. There is stable cardiomegaly with pulmonary vascularity within normal limits. There is atelectatic change in the lung bases. There is trace interstitial edema in the perihilar regions. Lungs elsewhere are clear. No adenopathy appreciable. IMPRESSION: Central catheter tip in right atrium. No pneumothorax. Stable cardiomegaly. Question underlying pericardial effusion given the cardiac silhouette. Bibasilar atelectasis. Slight interstitial edema in the perihilar regions. Lungs elsewhere clear. Electronically Signed   By: Lowella Grip III M.D.   On: 11/08/2016 16:06   Dg Chest Port 1 View  Result Date: 11/06/2016 CLINICAL DATA:  Initial evaluation for acute shortness of breath, abdominal pain. EXAM: PORTABLE CHEST 1 VIEW COMPARISON:  Prior radiograph from 03/12/2016. FINDINGS: Advanced cardiomegaly.  Mediastinal silhouette normal. Lungs hypoinflated. Perihilar vascular congestion with mild interstitial prominence, suggesting mild pulmonary interstitial edema. No frank alveolar edema. No focal infiltrates. No pleural effusion. No pneumothorax. No acute osseous abnormality. IMPRESSION: Cardiomegaly with mild diffuse pulmonary interstitial congestion/ edema. Electronically Signed   By: Jeannine Boga M.D.   On: 11/06/2016 02:51   Dg Fluoro Guide Cv Line-no Report  Result Date: 11/08/2016 Fluoroscopy was utilized by the requesting physician.  No radiographic interpretation.    Time Spent in minutes  30   Jani Gravel M.D on 11/13/2016 at 5:20 AM  Between 7am to 7pm - Pager - [phone]  After 7pm go to [email or web address] - password [facility]  [facility] -  Office   [phone]

## 2016-11-14 DIAGNOSIS — R188 Other ascites: Secondary | ICD-10-CM

## 2016-11-14 LAB — COMPREHENSIVE METABOLIC PANEL
ALBUMIN: 2.1 g/dL — AB (ref 3.5–5.0)
ALK PHOS: 337 U/L — AB (ref 38–126)
ALT: 133 U/L — AB (ref 17–63)
AST: 112 U/L — ABNORMAL HIGH (ref 15–41)
Anion gap: 7 (ref 5–15)
BUN: 43 mg/dL — ABNORMAL HIGH (ref 6–20)
CALCIUM: 7.9 mg/dL — AB (ref 8.9–10.3)
CHLORIDE: 97 mmol/L — AB (ref 101–111)
CO2: 27 mmol/L (ref 22–32)
CREATININE: 4.93 mg/dL — AB (ref 0.61–1.24)
GFR calc non Af Amer: 12 mL/min — ABNORMAL LOW (ref >60)
GFR, EST AFRICAN AMERICAN: 14 mL/min — AB (ref >60)
GLUCOSE: 190 mg/dL — AB (ref 65–99)
Potassium: 4 mmol/L (ref 3.5–5.1)
SODIUM: 131 mmol/L — AB (ref 135–145)
Total Bilirubin: 0.6 mg/dL (ref 0.3–1.2)
Total Protein: 5.3 g/dL — ABNORMAL LOW (ref 6.5–8.1)

## 2016-11-14 LAB — RENAL FUNCTION PANEL
Albumin: 2.1 g/dL — ABNORMAL LOW (ref 3.5–5.0)
Anion gap: 8 (ref 5–15)
BUN: 44 mg/dL — ABNORMAL HIGH (ref 6–20)
CO2: 25 mmol/L (ref 22–32)
Calcium: 7.9 mg/dL — ABNORMAL LOW (ref 8.9–10.3)
Chloride: 97 mmol/L — ABNORMAL LOW (ref 101–111)
Creatinine, Ser: 4.99 mg/dL — ABNORMAL HIGH (ref 0.61–1.24)
GFR calc Af Amer: 13 mL/min — ABNORMAL LOW (ref >60)
GFR calc non Af Amer: 12 mL/min — ABNORMAL LOW (ref >60)
Glucose, Bld: 194 mg/dL — ABNORMAL HIGH (ref 65–99)
Phosphorus: 3.6 mg/dL (ref 2.5–4.6)
Potassium: 4 mmol/L (ref 3.5–5.1)
Sodium: 130 mmol/L — ABNORMAL LOW (ref 135–145)

## 2016-11-14 LAB — GLUCOSE, CAPILLARY
GLUCOSE-CAPILLARY: 85 mg/dL (ref 65–99)
GLUCOSE-CAPILLARY: 243 mg/dL — AB (ref 65–99)
Glucose-Capillary: 190 mg/dL — ABNORMAL HIGH (ref 65–99)

## 2016-11-14 LAB — CBC
HCT: 24.3 % — ABNORMAL LOW (ref 39.0–52.0)
HCT: 24.2 % — ABNORMAL LOW (ref 39.0–52.0)
HEMOGLOBIN: 7.8 g/dL — AB (ref 13.0–17.0)
Hemoglobin: 7.9 g/dL — ABNORMAL LOW (ref 13.0–17.0)
MCH: 27.2 pg (ref 26.0–34.0)
MCH: 27.6 pg (ref 26.0–34.0)
MCHC: 32.1 g/dL (ref 30.0–36.0)
MCHC: 32.6 g/dL (ref 30.0–36.0)
MCV: 84.7 fL (ref 78.0–100.0)
MCV: 84.6 fL (ref 78.0–100.0)
PLATELETS: 140 10*3/uL — AB (ref 150–400)
Platelets: 139 10*3/uL — ABNORMAL LOW (ref 150–400)
RBC: 2.87 MIL/uL — AB (ref 4.22–5.81)
RBC: 2.86 MIL/uL — ABNORMAL LOW (ref 4.22–5.81)
RDW: 14.2 % (ref 11.5–15.5)
RDW: 14.2 % (ref 11.5–15.5)
WBC: 6 10*3/uL (ref 4.0–10.5)
WBC: 6.4 K/uL (ref 4.0–10.5)

## 2016-11-14 LAB — PATHOLOGIST SMEAR REVIEW

## 2016-11-14 MED ORDER — LIDOCAINE HCL (PF) 1 % IJ SOLN: 5.0000 mL | INTRAMUSCULAR | Status: DC | PRN

## 2016-11-14 MED ORDER — PENTAFLUOROPROP-TETRAFLUOROETH EX AERO: 1.0000 "application " | INHALATION_SPRAY | CUTANEOUS | Status: DC | PRN

## 2016-11-14 MED ORDER — HEPARIN SODIUM (PORCINE) 1000 UNIT/ML DIALYSIS: 100.0000 [IU]/kg | Freq: Once | INTRAMUSCULAR | Status: DC

## 2016-11-14 MED ORDER — LIDOCAINE-PRILOCAINE 2.5-2.5 % EX CREA: 1.0000 "application " | TOPICAL_CREAM | CUTANEOUS | Status: DC | PRN

## 2016-11-14 MED ORDER — DARBEPOETIN ALFA 150 MCG/0.3ML IJ SOSY
150.0000 ug | PREFILLED_SYRINGE | INTRAMUSCULAR | Status: DC
  Administered 2016-11-16: 150 ug via INTRAVENOUS
  Filled 2016-11-14: qty 0.3

## 2016-11-14 MED ORDER — SODIUM CHLORIDE 0.9 % IV SOLN: 100.0000 mL | INTRAVENOUS | Status: DC | PRN

## 2016-11-14 MED ORDER — HEPARIN SODIUM (PORCINE) 1000 UNIT/ML DIALYSIS: 1000.0000 [IU] | INTRAMUSCULAR | Status: DC | PRN

## 2016-11-14 MED ORDER — ALTEPLASE 2 MG IJ SOLR: 2.0000 mg | Freq: Once | INTRAMUSCULAR | Status: DC | PRN

## 2016-11-14 NOTE — Clinical Social Work Note (Addendum)
CSW and dialysis staff person [name] talked at the bedside with patient and wife regarding her husband's dialysis and 3 possible dialysis schedules. Wife became frustrated and continually indicated that she needs  help with transportation for husband and cannot afford to pay for transportation, as [employer] transportation charges a fare each way. CSW suggested that wife go to social services and talk with Medicaid worker about the status of her husband's Medicaid application, Medicaid transportation, and cost of Medicaid transportation. This information was typed for Russell Price and given to her. Wife advised that if she is able to talk with Medicaid worker, this worker can be contacted if needed. CSW continuing to follow to assist patient/wife with dialysis transportation.  Russell Price returned from [employer] and call made to [facility] worker [place] [phone]) to determine outcome of her visit. [name] indicated that patient's application has not been processed yet and she will not be able to process the Medicaid transportation application until she approves his Medicaid application. CSW provided with phone number for Non-Medicaid transportation.   Russell Price, MSW, LCSW Licensed Clinical Social Worker Russell Price [phone]

## 2016-11-14 NOTE — Progress Notes (Addendum)
[facility] Progress Note   Subjective: Feeling better. Less edema but ascites much better after 5L belly tap yesterday.   Objective: Vitals:   11/14/16 1000 11/14/16 1030 11/14/16 1059 11/14/16 1130  BP: (!) 128/56 (!) 128/56 (!) 129/59 (!) 133/51  Pulse: 61 60 62 62  Resp:   15 16  Temp:   99.3 F (37.4 C) 98.6 F (37 C)  TempSrc:   Oral Oral  SpO2:   99% 100%  Weight:   75.1 kg (165 lb 9.1 oz)   Height:       Physical Exam General: Pleasant, NAD Heart: S1,S2, RRR Lungs: CTAB A/P sl decreased in bases Abdomen: ascites better, nontender Extremities: trace edema bilat Dialysis Access: LUA AVF + bruit RIJ TDC drsg CDI.   Additional Objective Labs: Basic Metabolic Panel:  Recent Labs Lab 11/11/16 0353 11/13/16 0237 11/14/16 0418 11/14/16 0646  NA 126* 130* 131* 130*  K 4.0 3.6 4.0 4.0  CL 94* 96* 97* 97*  CO2 22 26 27 25   GLUCOSE 133* 90 190* 194*  BUN 79* 60* 43* 44*  CREATININE 5.73* 5.31* 4.93* 4.99*  CALCIUM 8.0* 8.1* 7.9* 7.9*  PHOS 4.9* 4.2  --  3.6   Liver Function Tests:  Recent Labs Lab 11/13/16 0237 11/14/16 0418 11/14/16 0646  AST  --  112*  --   ALT  --  133*  --   ALKPHOS  --  337*  --   BILITOT  --  0.6  --   PROT  --  5.3*  --   ALBUMIN 2.3* 2.1* 2.1*   No results for input(s): LIPASE, AMYLASE in the last 168 hours. CBC:  Recent Labs Lab 11/11/16 0353 11/12/16 0756 11/13/16 0237 11/14/16 0418 11/14/16 0646  WBC 5.4 6.5 6.3 6.0 6.4  NEUTROABS  --  4.7  --   --   --   HGB 7.0* 7.8* 8.0* 7.8* 7.9*  HCT 20.4* 23.6* 24.5* 24.3* 24.2*  MCV 81.0 82.8 84.8 84.7 84.6  PLT 110* 121* 145* 140* 139*   Blood Culture    Component Value Date/Time   SDES PERITONEAL 11/13/2016 1459   SPECREQUEST NONE 11/13/2016 1459   CULT NO GROWTH 1 DAY 11/12/2016 0756   CULT NO GROWTH 1 DAY 11/12/2016 0756   REPTSTATUS 11/13/2016 FINAL 11/13/2016 1459    Cardiac Enzymes: No results for input(s): CKTOTAL, CKMB, CKMBINDEX,  TROPONINI in the last 168 hours. CBG:  Recent Labs Lab 11/13/16 0834 11/13/16 1208 11/13/16 1655 11/13/16 2244 11/14/16 1128  GLUCAP 165* 198* 216* 190* 85   Iron Studies: No results for input(s): IRON, TIBC, TRANSFERRIN, FERRITIN in the last 72 hours. @lablastinr3 @ Studies/Results: Ir Paracentesis  Result Date: 11/13/2016 INDICATION: Patient with chronic kidney disease and volume overload. Request is made for diagnostic and therapeutic paracentesis. EXAM: ULTRASOUND GUIDED DIAGNOSTIC AND THERAPEUTIC PARACENTESIS MEDICATIONS: 10 mL 1% lidocaine COMPLICATIONS: None immediate. PROCEDURE: Informed written consent was obtained from the patient after a discussion of the risks, benefits and alternatives to treatment. A timeout was performed prior to the initiation of the procedure. Initial ultrasound scanning demonstrates a large amount of ascites within the right lateral abdomen. The right lateral abdomen was prepped and draped in the usual sterile fashion. 1% lidocaine was used for local anesthesia. Following this, a 19 gauge, 7-cm, Yueh catheter was introduced. An ultrasound image was saved for documentation purposes. The paracentesis was performed. The catheter was removed and a dressing was applied. The patient tolerated the procedure well without  immediate post procedural complication. FINDINGS: A total of approximately 5.0 liters of yellow fluid was removed. Samples were sent to the laboratory as requested by the clinical team. IMPRESSION: Successful ultrasound-guided diagnostic and therapeutic paracentesis yielding 5.0 liters of peritoneal fluid. Read by:  Brynda Greathouse PA-C Electronically Signed   By: Marybelle Killings M.D.   On: 11/13/2016 15:30   Medications: . sodium chloride Stopped (11/08/16 1547)  . sodium chloride    . sodium chloride    . sodium chloride     . aspirin  81 mg Oral Daily  . atorvastatin  40 mg Oral q1800  . calcitRIOL  0.25 mcg Oral Daily  . carvedilol  12.5 mg Oral  BID WC  . darbepoetin (ARANESP) injection - DIALYSIS  60 mcg Intravenous Q Sat-HD  . feeding supplement (NEPRO CARB STEADY)  237 mL Oral BID BM  . gabapentin  300 mg Oral Daily  . hydrocortisone   Rectal BID  . insulin aspart  0-9 Units Subcutaneous TID WC  . insulin glargine  15 Units Subcutaneous QHS  . multivitamin  1 tablet Oral QHS  . sodium chloride flush  3 mL Intravenous Q12H     HD Orders: new ESRD, going 4h, max UF, standard heparin, 400/ 800, using TDC/ AVF resting  Assessment/Plan: 1. CKD V now ESRD-1st HD 11/07/16 but had issues with infiltration of AVF. TDC placed. CLIP in progress, no insurance or transportation which is delaying discharge.  TTS HD inpt for now.  2.  Vol overload / ascites - much better w serial HD and paracentesis 5L on 6/27 3. Anemia - sp prbc's, cont ESA, will ^ to 150/ wk. 6/21 tsat was 41% so no Fe needed.   4. MBD of CKD - Phos 4.2 Ca 8.0 C Ca 9.36. On daily calcitriol, no binders.  5. HTN -  Cont Coreg 6. Nutrition - Albumin 2.3. Renal/Carb mod diet. Nephro/renal vits 7. DM: per primary 8. H/O CAD. No chest pain 9.  Dispo: unclear as no insurance to pay for OP HD    Kelly Splinter MD [employer] pager [phone]   11/14/2016, 1:44 PM

## 2016-11-14 NOTE — Progress Notes (Signed)
Patient ID: Russell Price, male   DOB: 1961/02/26, 56 y.o.   MRN: [id number]                                                                PROGRESS NOTE                                                                                                                                                                                                             Patient Demographics:    Russell Price, is a 56 y.o. male, DOB - 12-Aug-1960, HQP:[id number]  Admit date - 11/06/2016   Admitting Physician Russell Morton, MD  Outpatient Primary MD for the patient is Russell Morale, MD  LOS - 8  Outpatient Specialists:    Chief Complaint  Patient presents with  . 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 CVA Hyperlipidemia  Admitted early morning 6/20 severe hyponatremia 115, usual baseline 132, hyperkalemia 5.2 ? Volume overload secondary to oliguric renal failure Nephrology consulted, new start dialysis this admission Paracentesis 6/26 5L Awaiting CLIP   Subjective:    Russell Price today is s/p paracentesis of 5L yesterday.  Tolerating HD this am. Feels better.     No headache, No chest pain, No abdominal pain - No Nausea, No new weakness tingling or numbness, No Cough - SOB.    Assessment  & Plan :    Principal Problem:  Hyponatremia Active Problems:   Essential hypertension   DM (diabetes mellitus) with complications (HCC)   Anemia of chronic disease   ESRD (end stage renal disease) (HCC)   Fluid overload   Psychogenic polydipsia-admission sodium 115, baseline 09/02/16 was 132 CK D5--->ESRD declared this admission  Renal acidosis L AV fistula placement 05/14/2016--currently non functional AVF Appreciate VVS input re:  [facility] 6/22, fistulogram as per Dr. Donzetta Matters as OP  Secondary hyperparathyroidism-on binders per nephrology  Hypervolemic hyponatremia clinically secondary to volume overload-resolving oliguria sodium 119-->128 First episode of dialysis 6/21, tolerating subsequent dialysis well Continue strict I/O for now, collect urine-nursing informed daily renal panel changed to every other day with dialysis Dispo dependent on Medicaid authorization, no transport-family going to [facility] Medicaid office tomorrow to work out transport details AWAITING CLIP  Fever of unknown origin Only 1 time fever 102, follow BC X 2, urine culture from 6/26 CXR is negative If further fever without source would cover broadly with vancomycin and Zosyn and look for other causes of fever such as autoimmune etc. Afebrile this am  Anemia, microcytic Patient has never had a colonoscopy, hemoccult neg 6/22 nulecit x 1 given 6/22, 1 unit PRBC 6/22 External thrombosed hemorrhoid 6:00, 4:00--had some bleeding from this on this admission and needed transfusion 1 unit PRBC 11/11/2016 --if further bleeding will need GI involvement [discussed with Russell Gribbin6/26 who agrees that unless further bleeding no need for inpatient workup-might need surgical input if bleeds along with colonoscopy however] Ferritin 1,440, Tsat low, ? RES blockade vs Functional iron def--CRP 2.4 indicative of potential RES  blockade Hemoglobin 7.0repeat labs in 6/25-->7.8 on 6/27  Hgb stable  Thrombocytopenia mild, probably from overall iron deficiency vs HIT--  Now has low-grade thrombocyte with platelets in the 140 range.  sending HIT panel 6/25 (negative) place on SCDs 6/25 discontinue heparin Seems to be resolving on its own, trend 141-110->>121->140  CAD-microvascular disease based on cath [id number] Chronic diastolic heart failure without acute component, EF 58% htn with complication of hypertensive glomerulosclerosis Management will now be via dialysis DC off of Lasix 160 bid per nephrology since 6/21, Off chlorthalidone 25 daily Dosing of Coreg 12.5 bid per nephrologist, he has not been taking Procardia XL 90 mg for CAD  Diabetic neuropathy, nephropathy type II since last 20 years Continue Lantus 15 units Continue sensitive sliding scale coverage-sugars 132-150 Continue gabapentin 300 daily for neuropathy  Hyperlipidemia continue Lipitor  Moderate malnutrition Consider supplements with NePro, protein level is less than 3  Full CODE STATUS SCD Inpatient Disposition unclear-I've discussed with social work regarding clip  Patient will need Medicare Medicaid transport to and from dialysis prior to discharge   Consultants:  Nephrology  Vascular surgery  Procedures:  Dialysis  Temporary dialysis access placed in right upper chest 6/22  Paracentesis 6/26 5L by IR  Antimicrobials:  None     Lab Results  Component Value Date   PLT 140 (L) 11/14/2016    Antibiotics  :  Ancef 6/22  Anti-infectives    Start     Dose/Rate Route Frequency Ordered Stop   11/08/16 1500  ceFAZolin (ANCEF) IVPB 1 g/50 mL premix     1 g 100 mL/hr over 30 Minutes Intravenous  Once 11/08/16 1457 11/08/16 1514   11/08/16 1458   ceFAZolin (ANCEF) 1-4 GM/50ML-% IVPB    Comments:  Russell Price   : cabinet override      11/08/16 1458 11/08/16 1514        Objective:   Vitals:   11/13/16 1437 11/13/16 1548 11/13/16 2246 11/14/16 0514  BP: 139/60 (!) 137/54 (!) 139/51 (!) 137/45  Pulse:  67 69 72  Resp:  17 18 18   Temp:  98.2 F (36.8 C) 99.5 F (37.5 C) 98.7 F (37.1 C)  TempSrc:  Oral Oral Oral  SpO2:  99% 100% 100%  Weight:  77.1 kg (170 lb)   Height:        Wt Readings from Last 3 Encounters:  11/13/16 77.1 kg (170 lb)  09/12/16 78.9 kg (174 lb)  09/02/16 87.5 kg (193 lb)     Intake/Output Summary (Last 24 hours) at 11/14/16 0735 Last data filed at 11/14/16 0523  Gross per 24 hour  Intake              660 ml  Output              325 ml  Net              335 ml     Physical Exam  Awake Alert, Oriented X 3, No new F.N deficits, Normal affect [place].AT,PERRAL Supple Neck,No JVD, No cervical lymphadenopathy appriciated.  Symmetrical Chest wall movement, Good air movement bilaterally, CTAB RRR,No Gallops,Rubs or new Murmurs, No Parasternal Heave +ve B.Sounds, Abd Soft, No tenderness, No organomegaly appriciated, No rebound - guarding or rigidity. No Cyanosis, Clubbing or edema, No new Rash or bruise   HD catheter in right upper chest    Data Review:    CBC  Recent Labs Lab 11/09/16 0830 11/11/16 0353 11/12/16 0756 11/13/16 0237 11/14/16 0418  WBC 4.8 5.4 6.5 6.3 6.0  HGB 7.7* 7.0* 7.8* 8.0* 7.8*  HCT 22.8* 20.4* 23.6* 24.5* 24.3*  PLT 143* 110* 121* 145* 140*  MCV 80.9 81.0 82.8 84.8 84.7  MCH 27.3 27.8 27.4 27.7 27.2  MCHC 33.8 34.3 33.1 32.7 32.1  RDW 13.4 13.5 14.0 14.2 14.2  LYMPHSABS  --   --  0.7  --   --   MONOABS  --   --  0.7  --   --   EOSABS  --   --  0.5  --   --   BASOSABS  --   --  0.0  --   --     Chemistries   Recent Labs Lab 11/10/16 0801 11/10/16 1428 11/11/16 0353 11/13/16 0237 11/14/16 0418  NA 127* 126* 126* 130* 131*  K 3.9 4.0 4.0 3.6 4.0    CL 94* 93* 94* 96* 97*  CO2 23 23 22 26 27   GLUCOSE 87 104* 133* 90 190*  BUN 72* 74* 79* 60* 43*  CREATININE 5.12* 5.17* 5.73* 5.31* 4.93*  CALCIUM 7.9* 7.8* 8.0* 8.1* 7.9*  AST  --   --   --   --  112*  ALT  --   --   --   --  133*  ALKPHOS  --   --   --   --  337*  BILITOT  --   --   --   --  0.6   ------------------------------------------------------------------------------------------------------------------ No results for input(s): CHOL, HDL, LDLCALC, TRIG, CHOLHDL, LDLDIRECT in the last 72 hours.  Lab Results  Component Value Date   HGBA1C 7.4 09/02/2016   ------------------------------------------------------------------------------------------------------------------ No results for input(s): TSH, T4TOTAL, T3FREE, THYROIDAB in the last 72 hours.  Invalid input(s): FREET3 ------------------------------------------------------------------------------------------------------------------ No results for input(s): VITAMINB12, FOLATE, FERRITIN, TIBC, IRON, RETICCTPCT in the last 72 hours.  Coagulation profile No results for input(s): INR, PROTIME in the last 168 hours.  No results for input(s): DDIMER in the last 72 hours.  Cardiac Enzymes No results for input(s): CKMB, TROPONINI, MYOGLOBIN in the last 168 hours.  Invalid input(s): CK ------------------------------------------------------------------------------------------------------------------    Component Value Date/Time   BNP 1,114.8 (H) 02/27/2015 1452    Inpatient Medications  Scheduled Meds: . aspirin  81 mg Oral Daily  . atorvastatin  40 mg Oral q1800  . calcitRIOL  0.25 mcg Oral Daily  . carvedilol  12.5 mg Oral BID WC  . darbepoetin (ARANESP) injection - DIALYSIS  60 mcg Intravenous Q Sat-HD  . feeding supplement (NEPRO CARB STEADY)  237 mL Oral BID BM  . gabapentin  300 mg Oral Daily  . [START ON 11/15/2016] heparin  100 Units/kg Dialysis Once in dialysis  . hydrocortisone   Rectal BID  . insulin  aspart  0-9 Units Subcutaneous TID WC  . insulin glargine  15 Units Subcutaneous QHS  . multivitamin  1 tablet Oral QHS  . sodium chloride flush  3 mL Intravenous Q12H   Continuous Infusions: . sodium chloride Stopped (11/08/16 1547)  . sodium chloride    . sodium chloride    . sodium chloride    . sodium chloride    . sodium chloride    . sodium chloride    . sodium chloride     PRN Meds:.sodium chloride, sodium chloride, sodium chloride, sodium chloride, sodium chloride, sodium chloride, sodium chloride, acetaminophen **OR** acetaminophen, alteplase, heparin, ipratropium-albuterol, lidocaine (PF), lidocaine (PF), lidocaine (PF), lidocaine-prilocaine, lidocaine-prilocaine, ondansetron **OR** ondansetron (ZOFRAN) IV, pentafluoroprop-tetrafluoroeth, pentafluoroprop-tetrafluoroeth, sodium chloride flush  Micro Results Recent Results (from the past 240 hour(s))  MRSA PCR Screening     Status: None   Collection Time: 11/07/16 10:15 AM  Result Value Ref Range Status   MRSA by PCR NEGATIVE NEGATIVE Final    Comment:        The GeneXpert MRSA Assay (FDA approved for NASAL specimens only), is one component of a comprehensive MRSA colonization surveillance program. It is not intended to diagnose MRSA infection nor to guide or monitor treatment for MRSA infections.   Surgical PCR screen     Status: None   Collection Time: 11/08/16  2:09 PM  Result Value Ref Range Status   MRSA, PCR NEGATIVE NEGATIVE Final   Staphylococcus aureus NEGATIVE NEGATIVE Final    Comment:        The Xpert SA Assay (FDA approved for NASAL specimens in patients over 75 years of age), is one component of a comprehensive surveillance program.  Test performance has been validated by [facility] for patients greater than or equal to 54 year old. It is not intended to diagnose infection nor to guide or monitor treatment.   Culture, blood (routine x 2)     Status: None (Preliminary result)   Collection  Time: 11/12/16  7:56 AM  Result Value Ref Range Status   Specimen Description BLOOD LEFT ARM  Final   Special Requests   Final    BOTTLES DRAWN AEROBIC AND ANAEROBIC Blood Culture adequate volume   Culture NO GROWTH 1 DAY  Final   Report Status PENDING  Incomplete  Culture, blood (routine x 2)     Status: None (Preliminary result)   Collection Time: 11/12/16  7:56 AM  Result Value Ref Range Status   Specimen Description BLOOD LEFT HAND  Final   Special Requests   Final    BOTTLES DRAWN AEROBIC ONLY Blood Culture adequate volume   Culture NO GROWTH 1 DAY  Final   Report Status PENDING  Incomplete  Gram stain     Status: None   Collection Time: 11/13/16  2:59 PM  Result Value Ref Range Status   Specimen Description PERITONEAL  Final   Special Requests NONE  Final   Gram Stain   Final  RARE WBC PRESENT,BOTH PMN AND MONONUCLEAR NO ORGANISMS SEEN    Report Status 11/13/2016 FINAL  Final    Radiology Reports Dg Chest Port 1 View  Result Date: 11/12/2016 CLINICAL DATA:  Fevers EXAM: PORTABLE CHEST 1 VIEW COMPARISON:  11/08/2016 FINDINGS: Cardiac shadow remains enlarged. Dialysis catheter is again noted in satisfactory position. Interval clearing in the bases bilaterally is noted. No focal confluent infiltrate or sizable effusion is seen. No bony abnormality is noted. IMPRESSION: Improved aeration in the bases when compared with the prior exam Electronically Signed   By: Inez Catalina M.D.   On: 11/12/2016 07:19   Dg Chest Port 1 View  Result Date: 11/08/2016 CLINICAL DATA:  Central catheter placement EXAM: PORTABLE CHEST 1 VIEW COMPARISON:  November 06, 2016 FINDINGS: Central catheter tip is in the right atrium approximately 5 cm distal to the superior vena cava -right atrium junction. No pneumothorax. There is stable cardiomegaly with pulmonary vascularity within normal limits. There is atelectatic change in the lung bases. There is trace interstitial edema in the perihilar regions. Lungs  elsewhere are clear. No adenopathy appreciable. IMPRESSION: Central catheter tip in right atrium. No pneumothorax. Stable cardiomegaly. Question underlying pericardial effusion given the cardiac silhouette. Bibasilar atelectasis. Slight interstitial edema in the perihilar regions. Lungs elsewhere clear. Electronically Signed   By: Lowella Grip III M.D.   On: 11/08/2016 16:06   Dg Chest Port 1 View  Result Date: 11/06/2016 CLINICAL DATA:  Initial evaluation for acute shortness of breath, abdominal pain. EXAM: PORTABLE CHEST 1 VIEW COMPARISON:  Prior radiograph from 03/12/2016. FINDINGS: Advanced cardiomegaly.  Mediastinal silhouette normal. Lungs hypoinflated. Perihilar vascular congestion with mild interstitial prominence, suggesting mild pulmonary interstitial edema. No frank alveolar edema. No focal infiltrates. No pleural effusion. No pneumothorax. No acute osseous abnormality. IMPRESSION: Cardiomegaly with mild diffuse pulmonary interstitial congestion/ edema. Electronically Signed   By: Jeannine Boga M.D.   On: 11/06/2016 02:51   Dg Fluoro Guide Cv Line-no Report  Result Date: 11/08/2016 Fluoroscopy was utilized by the requesting physician.  No radiographic interpretation.   Ir Paracentesis  Result Date: 11/13/2016 INDICATION: Patient with chronic kidney disease and volume overload. Request is made for diagnostic and therapeutic paracentesis. EXAM: ULTRASOUND GUIDED DIAGNOSTIC AND THERAPEUTIC PARACENTESIS MEDICATIONS: 10 mL 1% lidocaine COMPLICATIONS: None immediate. PROCEDURE: Informed written consent was obtained from the patient after a discussion of the risks, benefits and alternatives to treatment. A timeout was performed prior to the initiation of the procedure. Initial ultrasound scanning demonstrates a large amount of ascites within the right lateral abdomen. The right lateral abdomen was prepped and draped in the usual sterile fashion. 1% lidocaine was used for local anesthesia.  Following this, a 19 gauge, 7-cm, Yueh catheter was introduced. An ultrasound image was saved for documentation purposes. The paracentesis was performed. The catheter was removed and a dressing was applied. The patient tolerated the procedure well without immediate post procedural complication. FINDINGS: A total of approximately 5.0 liters of yellow fluid was removed. Samples were sent to the laboratory as requested by the clinical team. IMPRESSION: Successful ultrasound-guided diagnostic and therapeutic paracentesis yielding 5.0 liters of peritoneal fluid. Read by:  Brynda Greathouse PA-C Electronically Signed   By: Marybelle Killings M.D.   On: 11/13/2016 15:30    Time Spent in minutes  30   Jani Gravel M.D on 11/14/2016 at 7:35 AM  Between 7am to 7pm - Pager - [phone] After 7pm go to [email or web address] - password [email or web address]  [facility] -  Office  [phone]     

## 2016-11-15 DIAGNOSIS — D638 Anemia in other chronic diseases classified elsewhere: Secondary | ICD-10-CM

## 2016-11-15 LAB — GLUCOSE, CAPILLARY
GLUCOSE-CAPILLARY: 197 mg/dL — AB (ref 65–99)
GLUCOSE-CAPILLARY: 236 mg/dL — AB (ref 65–99)
Glucose-Capillary: 279 mg/dL — ABNORMAL HIGH (ref 65–99)
Glucose-Capillary: 173 mg/dL — ABNORMAL HIGH (ref 65–99)

## 2016-11-15 MED ORDER — POLYETHYLENE GLYCOL 3350 17 G PO PACK
17.0000 g | PACK | Freq: Every day | ORAL | Status: DC
  Administered 2016-11-15 – 2016-11-16 (×2): 17 g via ORAL
  Filled 2016-11-15 (×2): qty 1

## 2016-11-15 NOTE — Progress Notes (Signed)
Patient ID: Russell Price, male   DOB: 03-21-1961, 56 y.o.   MRN: [id number]                                                                PROGRESS NOTE                                                                                                                                                                                                             Patient Demographics:    Russell Price, is a 56 y.o. male, DOB - 1960/08/17, WUJ:[id number]  Admit date - 11/06/2016   Admitting Physician Russell Morton, MD  Outpatient Primary MD for the patient is Russell Morale, MD  LOS - 9  Outpatient Specialists:     Chief Complaint  Patient presents with  . 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 CVA Hyperlipidemia  Admitted early morning 6/20 severe hyponatremia 115, usual baseline 132, hyperkalemia 5.2 ? Volume overload secondary to oliguric renal failure Nephrology consulted, new start dialysis this admission Paracentesis 6/26 5L Awaiting CLIP    Subjective:    Russell Price today has been doing well, feels slight constipation  No headache, No chest pain, No abdominal pain - No Nausea, No new weakness tingling or numbness, No Cough - SOB.    Assessment  & Plan :    Principal Problem:   Hyponatremia Active  Problems:   Essential hypertension   DM (diabetes mellitus) with complications (HCC)   Anemia of chronic disease   ESRD (end stage renal disease) (HCC)   Fluid overload   Ascites   Psychogenic polydipsia-admission sodium 115, baseline 09/02/16 was 132 CK D5--->ESRD declared this admission  Renal acidosis L AV fistula placement 05/14/2016--currently non functional AVF Appreciate VVS input re: [facility] 6/22,  fistulogram as per Dr. Donzetta Price as OP  Secondary hyperparathyroidism-on binders per nephrology  Hypervolemic hyponatremia clinically secondary to volume overload-resolving oliguria sodium 119-->128 First episode of dialysis 6/21, tolerating subsequent dialysis well Continue strict I/O for now, collect urine-nursing informed daily renal panel changed to every other day with dialysis Dispo dependent on Medicaid authorization, no transport-family going to [facility] Medicaid office tomorrow to work out transport details AWAITING CLIP  Fever of unknown origin Only 1 time fever 102, follow BC X 2, urine culture from 6/26 CXR is negative If further fever without source would cover broadly with vancomycin and Zosyn and look for other causes of fever such as autoimmune etc. Afebrile this am  Anemia, microcytic Patient has never had a colonoscopy, hemoccult neg 6/22 nulecit x 1 given 6/22, 1 unit PRBC 6/22 External thrombosed hemorrhoid 6:00, 4:00--had some bleeding from this on this admission and needed transfusion 1 unit PRBC 11/11/2016 --if further bleeding will need GI involvement [discussed with Russell Gribbin6/26 who agrees that unless further bleeding no need for inpatient workup-might need surgical input if bleeds along with colonoscopy however] Ferritin 1,440, Tsat low, ? RES blockade vs Functional iron def--CRP 2.4 indicative of potential RES  blockade Hemoglobin 7.0repeat labs in 6/25-->7.8 on 6/27 Hgb stable  Thrombocytopenia mild, probably from overall iron deficiency vs HIT--  Now has low-grade thrombocyte with platelets in the 140 range.  sending HIT panel 6/25 (negative) place on SCDs 6/25 discontinue heparin Seems to be resolving on its own, trend 141-110->>121->140  CAD-microvascular disease based on cath [id number] Chronic diastolic heart failure without acute component, EF 40% htn with complication of hypertensive glomerulosclerosis Management will now be via dialysis DC off of Lasix 160 bid per nephrology since 6/21, Off chlorthalidone 25 daily Dosing of Coreg 12.5 bid per nephrologist, he has not been taking Procardia XL 90 mg for CAD  Diabetic neuropathy, nephropathy type II since last 20 years Continue Lantus 15 units Continue sensitive sliding scale coverage-sugars 132-150 Continue gabapentin 300 daily for neuropathy  Hyperlipidemia continue Lipitor  Moderate malnutrition Consider supplements with NePro, protein level is less than 3  Full CODE STATUS SCD Inpatient Social work working on [employer] Patient will need Medicare Medicaid transport to and from dialysis prior to discharge   Consultants:  Nephrology  Vascular surgery  Procedures:  Dialysis  Temporary dialysis access placed in right upper chest 6/22  Paracentesis 6/26 5L by IR  Antimicrobials:  None       Antibiotics  :  Ancef 6/22   Lab Results  Component Value Date   PLT 139 (L) 11/14/2016      Anti-infectives    Start     Dose/Rate Route Frequency Ordered Stop   11/08/16 1500  ceFAZolin (ANCEF) IVPB 1 g/50 mL premix     1 g 100 mL/hr over 30 Minutes Intravenous  Once 11/08/16 1457 11/08/16 1514   11/08/16 1458  ceFAZolin (ANCEF) 1-4  GM/50ML-% IVPB    Comments:  Russell Price   : cabinet override      11/08/16 1458 11/08/16 1514        Objective:   Vitals:   11/14/16 1706 11/14/16 2045 11/15/16 0635 11/15/16 0954  BP: (!) 144/48 (!) 137/52 (!) 141/54 (!) 155/60  Pulse: 70 65 67 (!) 58  Resp: 16 16 17 18   Temp: 98.2 F (36.8 C) 98.7 F (37.1 C) 98.3 F (36.8 C) 98 F (36.7 C)  TempSrc: Oral Oral Oral Oral  SpO2: 99% 100% 99% 98%  Weight:  74.6 kg (164 lb 6.4 oz)    Height:        Wt Readings from Last 3 Encounters:  11/14/16 74.6 kg (164 lb 6.4 oz)  09/12/16 78.9 kg (174 lb)  09/02/16 87.5 kg (193 lb)     Intake/Output Summary (Last 24 hours) at 11/15/16 1335 Last data filed at 11/15/16 0900  Gross per 24 hour  Intake              540 ml  Output               50 ml  Net              490 ml     Physical Exam  Awake Alert, Oriented X 3, No new F.N deficits, Normal affect [place].AT,PERRAL Supple Neck,No JVD, No cervical lymphadenopathy appriciated.  Symmetrical Chest wall movement, Good air movement bilaterally, CTAB RRR,No Gallops,Rubs or new Murmurs, No Parasternal Heave +ve B.Sounds, Abd Soft, No tenderness, No organomegaly appriciated, No rebound - guarding or rigidity. No Cyanosis, Clubbing or edema, No new Rash or bruise      Data Review:    CBC  Recent Labs Lab 11/11/16 0353 11/12/16 0756 11/13/16 0237 11/14/16 0418 11/14/16 0646  WBC 5.4 6.5 6.3 6.0 6.4  HGB 7.0* 7.8* 8.0* 7.8* 7.9*  HCT 20.4* 23.6* 24.5* 24.3* 24.2*  PLT 110* 121* 145* 140* 139*  MCV 81.0 82.8 84.8 84.7 84.6  MCH 27.8 27.4 27.7 27.2 27.6  MCHC 34.3 33.1 32.7 32.1 32.6  RDW 13.5 14.0 14.2 14.2 14.2  LYMPHSABS  --  0.7  --   --   --   MONOABS  --  0.7  --   --   --   EOSABS  --  0.5  --   --   --   BASOSABS  --  0.0  --   --   --     Chemistries   Recent Labs Lab 11/10/16 1428 11/11/16 0353 11/13/16 0237 11/14/16 0418 11/14/16 0646  NA 126* 126* 130* 131* 130*  K 4.0 4.0 3.6 4.0 4.0  CL 93*  94* 96* 97* 97*  CO2 23 22 26 27 25   GLUCOSE 104* 133* 90 190* 194*  BUN 74* 79* 60* 43* 44*  CREATININE 5.17* 5.73* 5.31* 4.93* 4.99*  CALCIUM 7.8* 8.0* 8.1* 7.9* 7.9*  AST  --   --   --  112*  --   ALT  --   --   --  133*  --   ALKPHOS  --   --   --  337*  --   BILITOT  --   --   --  0.6  --    ------------------------------------------------------------------------------------------------------------------ No results for input(s): CHOL, HDL, LDLCALC, TRIG, CHOLHDL, LDLDIRECT in the last 72 hours.  Lab Results  Component Value Date   HGBA1C 7.4 09/02/2016   ------------------------------------------------------------------------------------------------------------------ No results for input(s): TSH, T4TOTAL, T3FREE, THYROIDAB in the last 72 hours.  Invalid input(s): FREET3 ------------------------------------------------------------------------------------------------------------------ No results for input(s): VITAMINB12, FOLATE, FERRITIN, TIBC, IRON, RETICCTPCT in the last 72 hours.  Coagulation profile No results for input(s): INR, PROTIME in the last 168 hours.  No results for input(s): DDIMER in the last 72 hours.  Cardiac Enzymes No results for input(s): CKMB, TROPONINI, MYOGLOBIN in the last 168 hours.  Invalid input(s): CK ------------------------------------------------------------------------------------------------------------------    Component Value Date/Time   BNP 1,114.8 (H) 02/27/2015 1452    Inpatient Medications  Scheduled Meds: . aspirin  81 mg Oral Daily  . atorvastatin  40 mg Oral q1800  . calcitRIOL  0.25 mcg Oral Daily  . carvedilol  12.5 mg Oral BID WC  . [START ON 11/16/2016] darbepoetin (ARANESP) injection - DIALYSIS  150 mcg Intravenous Q Sat-HD  . feeding supplement (NEPRO CARB STEADY)  237 mL Oral BID BM  . gabapentin  300 mg Oral Daily  . hydrocortisone   Rectal BID  . insulin aspart  0-9 Units Subcutaneous TID WC  . insulin glargine   15 Units Subcutaneous QHS  . multivitamin  1 tablet Oral QHS  . polyethylene glycol  17 g Oral Daily   Continuous Infusions: PRN Meds:.acetaminophen **OR** acetaminophen, ipratropium-albuterol, lidocaine (PF), ondansetron **OR** ondansetron (ZOFRAN) IV  Micro Results Recent Results (from the past 240 hour(s))  MRSA PCR Screening     Status: None   Collection Time: 11/07/16 10:15 AM  Result Value Ref Range Status   MRSA by PCR NEGATIVE NEGATIVE Final    Comment:        The GeneXpert MRSA Assay (FDA approved for NASAL specimens only), is one component of a comprehensive MRSA colonization surveillance program. It is not intended to diagnose MRSA infection nor to guide or monitor treatment for MRSA infections.   Surgical PCR screen     Status: None   Collection Time: 11/08/16  2:09 PM  Result Value Ref Range Status   MRSA, PCR NEGATIVE NEGATIVE Final   Staphylococcus aureus NEGATIVE NEGATIVE Final    Comment:        The Xpert SA Assay (FDA approved for NASAL specimens in patients over 41 years of age), is one component of a comprehensive surveillance program.  Test performance has been validated by [facility] for patients greater than or equal to 38 year old. It is not intended to diagnose infection nor to guide or monitor treatment.   Culture, blood (routine x 2)     Status: None (Preliminary result)   Collection Time: 11/12/16  7:56 AM  Result Value Ref Range Status   Specimen Description BLOOD LEFT ARM  Final   Special Requests   Final    BOTTLES DRAWN AEROBIC AND ANAEROBIC Blood Culture adequate volume   Culture NO GROWTH 2 DAYS  Final   Report Status PENDING  Incomplete  Culture, blood (routine x 2)     Status: None (Preliminary result)   Collection Time: 11/12/16  7:56 AM  Result Value Ref Range Status   Specimen Description BLOOD LEFT HAND  Final   Special Requests   Final    BOTTLES DRAWN AEROBIC ONLY Blood Culture adequate volume   Culture NO GROWTH 2  DAYS  Final   Report Status PENDING  Incomplete  Culture, body fluid-bottle     Status: None (Preliminary result)   Collection Time: 11/13/16  2:59 PM  Result Value Ref Range Status   Specimen Description PERITONEAL  Final   Special Requests NONE  Final   Culture NO GROWTH < 24 HOURS  Final   Report Status PENDING  Incomplete  Gram stain     Status: None   Collection Time: 11/13/16  2:59 PM  Result Value Ref Range Status   Specimen Description PERITONEAL  Final   Special Requests NONE  Final   Gram Stain   Final    RARE WBC PRESENT,BOTH PMN AND MONONUCLEAR NO ORGANISMS SEEN    Report Status 11/13/2016 FINAL  Final    Radiology Reports Dg Chest [facility] 1 View  Result  Date: 11/12/2016 CLINICAL DATA:  Fevers EXAM: PORTABLE CHEST 1 VIEW COMPARISON:  11/08/2016 FINDINGS: Cardiac shadow remains enlarged. Dialysis catheter is again noted in satisfactory position. Interval clearing in the bases bilaterally is noted. No focal confluent infiltrate or sizable effusion is seen. No bony abnormality is noted. IMPRESSION: Improved aeration in the bases when compared with the prior exam Electronically Signed   By: Inez Catalina M.D.   On: 11/12/2016 07:19   Dg Chest Port 1 View  Result Date: 11/08/2016 CLINICAL DATA:  Central catheter placement EXAM: PORTABLE CHEST 1 VIEW COMPARISON:  November 06, 2016 FINDINGS: Central catheter tip is in the right atrium approximately 5 cm distal to the superior vena cava -right atrium junction. No pneumothorax. There is stable cardiomegaly with pulmonary vascularity within normal limits. There is atelectatic change in the lung bases. There is trace interstitial edema in the perihilar regions. Lungs elsewhere are clear. No adenopathy appreciable. IMPRESSION: Central catheter tip in right atrium. No pneumothorax. Stable cardiomegaly. Question underlying pericardial effusion given the cardiac silhouette. Bibasilar atelectasis. Slight interstitial edema in the perihilar regions.  Lungs elsewhere clear. Electronically Signed   By: Lowella Grip III M.D.   On: 11/08/2016 16:06   Dg Chest Port 1 View  Result Date: 11/06/2016 CLINICAL DATA:  Initial evaluation for acute shortness of breath, abdominal pain. EXAM: PORTABLE CHEST 1 VIEW COMPARISON:  Prior radiograph from 03/12/2016. FINDINGS: Advanced cardiomegaly.  Mediastinal silhouette normal. Lungs hypoinflated. Perihilar vascular congestion with mild interstitial prominence, suggesting mild pulmonary interstitial edema. No frank alveolar edema. No focal infiltrates. No pleural effusion. No pneumothorax. No acute osseous abnormality. IMPRESSION: Cardiomegaly with mild diffuse pulmonary interstitial congestion/ edema. Electronically Signed   By: Jeannine Boga M.D.   On: 11/06/2016 02:51   Dg Fluoro Guide Cv Line-no Report  Result Date: 11/08/2016 Fluoroscopy was utilized by the requesting physician.  No radiographic interpretation.   Ir Paracentesis  Result Date: 11/13/2016 INDICATION: Patient with chronic kidney disease and volume overload. Request is made for diagnostic and therapeutic paracentesis. EXAM: ULTRASOUND GUIDED DIAGNOSTIC AND THERAPEUTIC PARACENTESIS MEDICATIONS: 10 mL 1% lidocaine COMPLICATIONS: None immediate. PROCEDURE: Informed written consent was obtained from the patient after a discussion of the risks, benefits and alternatives to treatment. A timeout was performed prior to the initiation of the procedure. Initial ultrasound scanning demonstrates a large amount of ascites within the right lateral abdomen. The right lateral abdomen was prepped and draped in the usual sterile fashion. 1% lidocaine was used for local anesthesia. Following this, a 19 gauge, 7-cm, Yueh catheter was introduced. An ultrasound image was saved for documentation purposes. The paracentesis was performed. The catheter was removed and a dressing was applied. The patient tolerated the procedure well without immediate post procedural  complication. FINDINGS: A total of approximately 5.0 liters of yellow fluid was removed. Samples were sent to the laboratory as requested by the clinical team. IMPRESSION: Successful ultrasound-guided diagnostic and therapeutic paracentesis yielding 5.0 liters of peritoneal fluid. Read by:  Brynda Greathouse PA-C Electronically Signed   By: Marybelle Killings M.D.   On: 11/13/2016 15:30    Time Spent in minutes  30   Jani Gravel M.D on 11/15/2016 at 1:35 PM  Between 7am to 7pm - Pager - [phone]  After 7pm go to [email or web address] - password [facility]  [facility] -  Office  [phone]

## 2016-11-15 NOTE — Clinical Social Work Note (Signed)
CSW contacted [place] and talked with Olivia Mackie (Non-Medicaid Transportation [phone]), to determine if they can assist patient with dialysis transportation. CSW informed that non-dialysis transportation currently has a waiting list (grant-funded transport) and patient has to apply for SCAT first. Per Olivia Mackie, patient/wife can talk with dialysis social worker regarding non-Medicaid transportation when he goes for his first treatment.   CSW and dialysis staff person [name] met with patient and wife today regarding his dialysis schedule and transportation to dialysis. Patient/wife advised that he has been assigned to [facility] dialysis center. Patient called his brother-in-law and asked [name] to talk with him regarding his dialysis schedule. Brother agreed to assist with transport until patient is approved for Medicaid transportation.    Patient's dialysis schedule is TTS at [facility] on [address]. His first treatment is 7/3 and patient needs to report at 11:15 am (to do paperwork). His regular dialysis time is 12:15 pm. Patient/wife given this information in writing. Wife also given typed information regarding transportation to take with her on 7/3.  Alexsa Flaum Givens, MSW, LCSW Licensed Clinical Social Worker [place] [phone]

## 2016-11-15 NOTE — Progress Notes (Signed)
[facility] Progress Note   Subjective:  In bed, eating [employer] chicken biscuit. Counseled Na and phos restrictions. No C/Os. Says he feels better.  Objective Vitals:   11/14/16 1706 11/14/16 2045 11/15/16 0635 11/15/16 0954  BP: (!) 144/48 (!) 137/52 (!) 141/54 (!) 155/60  Pulse: 70 65 67 (!) 58  Resp: 16 16 17 18   Temp: 98.2 F (36.8 C) 98.7 F (37.1 C) 98.3 F (36.8 C) 98 F (36.7 C)  TempSrc: Oral Oral Oral Oral  SpO2: 99% 100% 99% 98%  Weight:  74.6 kg (164 lb 6.4 oz)    Height:       Physical Exam General: awake, alert, NAD Heart: S1,S2, RRR Lungs: CTAB A/P Abdomen: ascites decreased, non-tender Extremities: trace ankle and pedal edema bilaterally. Dialysis Access: LUA AVF + bruit RIJ [facility] CDI   Additional Objective Labs: Basic Metabolic Panel:  Recent Labs Lab 11/11/16 0353 11/13/16 0237 11/14/16 0418 11/14/16 0646  NA 126* 130* 131* 130*  K 4.0 3.6 4.0 4.0  CL 94* 96* 97* 97*  CO2 22 26 27 25   GLUCOSE 133* 90 190* 194*  BUN 79* 60* 43* 44*  CREATININE 5.73* 5.31* 4.93* 4.99*  CALCIUM 8.0* 8.1* 7.9* 7.9*  PHOS 4.9* 4.2  --  3.6   Liver Function Tests:  Recent Labs Lab 11/13/16 0237 11/14/16 0418 11/14/16 0646  AST  --  112*  --   ALT  --  133*  --   ALKPHOS  --  337*  --   BILITOT  --  0.6  --   PROT  --  5.3*  --   ALBUMIN 2.3* 2.1* 2.1*   No results for input(s): LIPASE, AMYLASE in the last 168 hours. CBC:  Recent Labs Lab 11/11/16 0353 11/12/16 0756 11/13/16 0237 11/14/16 0418 11/14/16 0646  WBC 5.4 6.5 6.3 6.0 6.4  NEUTROABS  --  4.7  --   --   --   HGB 7.0* 7.8* 8.0* 7.8* 7.9*  HCT 20.4* 23.6* 24.5* 24.3* 24.2*  MCV 81.0 82.8 84.8 84.7 84.6  PLT 110* 121* 145* 140* 139*   Blood Culture    Component Value Date/Time   SDES PERITONEAL 11/13/2016 1459   SDES PERITONEAL 11/13/2016 1459   SPECREQUEST NONE 11/13/2016 1459   SPECREQUEST NONE 11/13/2016 1459   CULT NO GROWTH < 24 HOURS 11/13/2016 1459    REPTSTATUS PENDING 11/13/2016 1459   REPTSTATUS 11/13/2016 FINAL 11/13/2016 1459    Cardiac Enzymes: No results for input(s): CKTOTAL, CKMB, CKMBINDEX, TROPONINI in the last 168 hours. CBG:  Recent Labs Lab 11/13/16 2244 11/14/16 1128 11/14/16 1705 11/14/16 2108 11/15/16 0757  GLUCAP 190* 85 190* 243* 197*   Iron Studies: No results for input(s): IRON, TIBC, TRANSFERRIN, FERRITIN in the last 72 hours. @lablastinr3 @ Studies/Results: Ir Paracentesis  Result Date: 11/13/2016 INDICATION: Patient with chronic kidney disease and volume overload. Request is made for diagnostic and therapeutic paracentesis. EXAM: ULTRASOUND GUIDED DIAGNOSTIC AND THERAPEUTIC PARACENTESIS MEDICATIONS: 10 mL 1% lidocaine COMPLICATIONS: None immediate. PROCEDURE: Informed written consent was obtained from the patient after a discussion of the risks, benefits and alternatives to treatment. A timeout was performed prior to the initiation of the procedure. Initial ultrasound scanning demonstrates a large amount of ascites within the right lateral abdomen. The right lateral abdomen was prepped and draped in the usual sterile fashion. 1% lidocaine was used for local anesthesia. Following this, a 19 gauge, 7-cm, Yueh catheter was introduced. An ultrasound image was saved for documentation  purposes. The paracentesis was performed. The catheter was removed and a dressing was applied. The patient tolerated the procedure well without immediate post procedural complication. FINDINGS: A total of approximately 5.0 liters of yellow fluid was removed. Samples were sent to the laboratory as requested by the clinical team. IMPRESSION: Successful ultrasound-guided diagnostic and therapeutic paracentesis yielding 5.0 liters of peritoneal fluid. Read by:  Brynda Greathouse PA-C Electronically Signed   By: Marybelle Killings M.D.   On: 11/13/2016 15:30   Medications:  . aspirin  81 mg Oral Daily  . atorvastatin  40 mg Oral q1800  . calcitRIOL   0.25 mcg Oral Daily  . carvedilol  12.5 mg Oral BID WC  . [START ON 11/16/2016] darbepoetin (ARANESP) injection - DIALYSIS  150 mcg Intravenous Q Sat-HD  . feeding supplement (NEPRO CARB STEADY)  237 mL Oral BID BM  . gabapentin  300 mg Oral Daily  . hydrocortisone   Rectal BID  . insulin aspart  0-9 Units Subcutaneous TID WC  . insulin glargine  15 Units Subcutaneous QHS  . multivitamin  1 tablet Oral QHS     HD Orders: new ESRD, going 4h, max UF, standard heparin, 400/ 800, using TDC/ AVF resting  Assessment/Plan: 1. CKD V now ESRD-1st HD 11/07/16 but had issues with infiltration of AVF. TDC placed. CLIP in progress, no insurance or transportation which is delaying discharge.  TTS HD inpt for now.  2.  Vol overload / ascites - much better w serial HD and paracentesis 5L on 6/27. Next HD 11/16/16 3. Anemia - sp prbc's, cont ESA, will ^ to 150/ wk. 6/21 tsat was 41% so no Fe needed.   4. MBD of CKD - Phos 3.6 Ca 7.9C Ca 9.4. On daily calcitriol, no binders.  5. HTN -  Cont Coreg 6. Nutrition - Albumin 2.3. Renal/Carb mod diet. Nephro/renal vits 7. DM: per primary 8. H/O CAD. No chest pain 9.  Dispo: unclear as no insurance to pay for OP HD. Apparently awaiting Medicaid application approval.   Jimmye Norman. Brown NP-C 11/15/2016, 10:13 AM  [facility] [phone]  Pt seen, examined and agree w A/P as above.  Kelly Splinter MD [employer] pager [phone]   11/15/2016, 11:16 AM

## 2016-11-16 LAB — RENAL FUNCTION PANEL
Albumin: 2.2 g/dL — ABNORMAL LOW (ref 3.5–5.0)
Anion gap: 8 (ref 5–15)
BUN: 48 mg/dL — ABNORMAL HIGH (ref 6–20)
CO2: 27 mmol/L (ref 22–32)
Calcium: 8.1 mg/dL — ABNORMAL LOW (ref 8.9–10.3)
Chloride: 96 mmol/L — ABNORMAL LOW (ref 101–111)
Creatinine, Ser: 5.14 mg/dL — ABNORMAL HIGH (ref 0.61–1.24)
GFR calc Af Amer: 13 mL/min — ABNORMAL LOW (ref >60)
GFR calc non Af Amer: 11 mL/min — ABNORMAL LOW (ref >60)
Glucose, Bld: 152 mg/dL — ABNORMAL HIGH (ref 65–99)
Phosphorus: 4.9 mg/dL — ABNORMAL HIGH (ref 2.5–4.6)
Potassium: 4.2 mmol/L (ref 3.5–5.1)
Sodium: 131 mmol/L — ABNORMAL LOW (ref 135–145)

## 2016-11-16 LAB — CBC
HCT: 22.6 % — ABNORMAL LOW (ref 39.0–52.0)
HEMATOCRIT: 23.8 % — AB (ref 39.0–52.0)
HEMOGLOBIN: 7.7 g/dL — AB (ref 13.0–17.0)
Hemoglobin: 7.4 g/dL — ABNORMAL LOW (ref 13.0–17.0)
MCH: 28 pg (ref 26.0–34.0)
MCH: 28.2 pg (ref 26.0–34.0)
MCHC: 32.4 g/dL (ref 30.0–36.0)
MCHC: 32.7 g/dL (ref 30.0–36.0)
MCV: 86.5 fL (ref 78.0–100.0)
MCV: 86.3 fL (ref 78.0–100.0)
Platelets: 127 10*3/uL — ABNORMAL LOW (ref 150–400)
Platelets: 132 10*3/uL — ABNORMAL LOW (ref 150–400)
RBC: 2.75 MIL/uL — AB (ref 4.22–5.81)
RBC: 2.62 MIL/uL — ABNORMAL LOW (ref 4.22–5.81)
RDW: 14.5 % (ref 11.5–15.5)
RDW: 14.5 % (ref 11.5–15.5)
WBC: 6.1 10*3/uL (ref 4.0–10.5)
WBC: 6 K/uL (ref 4.0–10.5)

## 2016-11-16 LAB — COMPREHENSIVE METABOLIC PANEL
ALBUMIN: 2.1 g/dL — AB (ref 3.5–5.0)
ALK PHOS: 345 U/L — AB (ref 38–126)
ALT: 173 U/L — ABNORMAL HIGH (ref 17–63)
AST: 142 U/L — AB (ref 15–41)
Anion gap: 12 (ref 5–15)
BILIRUBIN TOTAL: 0.8 mg/dL (ref 0.3–1.2)
BUN: 46 mg/dL — AB (ref 6–20)
CALCIUM: 8 mg/dL — AB (ref 8.9–10.3)
CO2: 24 mmol/L (ref 22–32)
Chloride: 98 mmol/L — ABNORMAL LOW (ref 101–111)
Creatinine, Ser: 5.01 mg/dL — ABNORMAL HIGH (ref 0.61–1.24)
GFR calc Af Amer: 13 mL/min — ABNORMAL LOW (ref >60)
GFR calc non Af Amer: 12 mL/min — ABNORMAL LOW (ref >60)
GLUCOSE: 92 mg/dL (ref 65–99)
POTASSIUM: 4.5 mmol/L (ref 3.5–5.1)
SODIUM: 134 mmol/L — AB (ref 135–145)
TOTAL PROTEIN: 5.3 g/dL — AB (ref 6.5–8.1)

## 2016-11-16 LAB — GLUCOSE, CAPILLARY
Glucose-Capillary: 108 mg/dL — ABNORMAL HIGH (ref 65–99)
Glucose-Capillary: 84 mg/dL (ref 65–99)

## 2016-11-16 MED ORDER — HEPARIN SODIUM (PORCINE) 1000 UNIT/ML DIALYSIS: 1000.0000 [IU] | INTRAMUSCULAR | Status: DC | PRN

## 2016-11-16 MED ORDER — DARBEPOETIN ALFA 150 MCG/0.3ML IJ SOSY: 150.0000 ug | PREFILLED_SYRINGE | INTRAMUSCULAR | 0 refills | Status: DC

## 2016-11-16 MED ORDER — NEPRO/CARBSTEADY PO LIQD: 237.0000 mL | Freq: Two times a day (BID) | ORAL | 0 refills | Status: DC

## 2016-11-16 MED ORDER — ALTEPLASE 2 MG IJ SOLR: 2.0000 mg | Freq: Once | INTRAMUSCULAR | Status: DC | PRN

## 2016-11-16 MED ORDER — POLYETHYLENE GLYCOL 3350 17 G PO PACK: 17.0000 g | PACK | Freq: Every day | ORAL | 0 refills | Status: DC

## 2016-11-16 MED ORDER — CALCITRIOL 0.25 MCG PO CAPS
ORAL_CAPSULE | ORAL | Status: AC
  Administered 2016-11-16: 0.25 ug via ORAL
  Filled 2016-11-16: qty 1

## 2016-11-16 MED ORDER — PENTAFLUOROPROP-TETRAFLUOROETH EX AERO: 1.0000 "application " | INHALATION_SPRAY | CUTANEOUS | Status: DC | PRN

## 2016-11-16 MED ORDER — HYDROCORTISONE 2.5 % RE CREA: TOPICAL_CREAM | Freq: Two times a day (BID) | RECTAL | 0 refills | Status: DC

## 2016-11-16 MED ORDER — HEPARIN SODIUM (PORCINE) 1000 UNIT/ML DIALYSIS
100.0000 [IU]/kg | INTRAMUSCULAR | Status: DC | PRN
  Filled 2016-11-16: qty 8

## 2016-11-16 MED ORDER — SODIUM CHLORIDE 0.9 % IV SOLN: 100.0000 mL | INTRAVENOUS | Status: DC | PRN

## 2016-11-16 MED ORDER — LIDOCAINE-PRILOCAINE 2.5-2.5 % EX CREA: 1.0000 "application " | TOPICAL_CREAM | CUTANEOUS | Status: DC | PRN

## 2016-11-16 MED ORDER — DARBEPOETIN ALFA 150 MCG/0.3ML IJ SOSY
PREFILLED_SYRINGE | INTRAMUSCULAR | Status: AC
  Administered 2016-11-16: 150 ug via INTRAVENOUS
  Filled 2016-11-16: qty 0.3

## 2016-11-16 NOTE — Progress Notes (Signed)
Spoke with pts wife re: her concerns re: pt's assigned dialysis center/transportation costs/arrangements.  Per wife, it would be less of a financial and emotional burden for her if pt could dialyze at [place], as opposed to [facility].  [place] is closer to pt's home and she believes she would have more help with pt's transportation if he changed centers.  CSW advised pt/wife to reach out to the dialysis center social worker for guidance/advice on this matter.  Supportive counseling/emotional support provided.  Pt's wife very appreciative of CSW visit.  Jeral Fruit Moshe Wenger,LCSW [employer] [phone]

## 2016-11-16 NOTE — Progress Notes (Signed)
Patient's wife requesting to speak with [place] contacted. SW stated that she would be up shortly to speak with them prior to D/C. Patient and wife informed of this information. Will continue to monitor.

## 2016-11-16 NOTE — Progress Notes (Signed)
Patient ID: Russell Price, male   DOB: 1960/11/01, 55 y.o.   MRN: [id number]                                                                PROGRESS NOTE                                                                                                                                                                                                             Patient Demographics:    Russell Price, is a 56 y.o. male, DOB - 1960/08/31, HKV:[id number]  Admit date - 11/06/2016   Admitting Physician Norval Morton, MD  Outpatient Primary MD for the patient is Arnoldo Morale, MD  LOS - 10  Outpatient Specialists:    Chief Complaint  Patient presents with  . 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 CVA Hyperlipidemia  Admitted early morning 6/20 severe hyponatremia 115, usual baseline 132, hyperkalemia 5.2 ? Volume overload secondary to oliguric renal failure Nephrology consulted, new start dialysis this admission Paracentesis 6/26 5L Awaiting CLIP     Subjective:    Russell Price today is doing well.  Pt has been assigned to [facility].  CSW is working on transportation,  And if nephrology is ok may be able to d/c after dialysis today.    No headache, No chest pain, No abdominal pain - No Nausea, No new weakness  tingling or numbness, No Cough - SOB.    Assessment  & Plan :    Principal Problem:   Hyponatremia Active Problems:   Essential hypertension   DM (diabetes mellitus) with complications (HCC)   Anemia of chronic disease   ESRD (end stage renal disease) (HCC)   Fluid overload   Ascites  Psychogenic polydipsia-admission sodium 115, baseline 09/02/16 was 132  CK D5--->ESRD declared this admission Hypervolemic hyponatremia clinically secondary to volume overload-resolving oliguria sodium 119-->128 First episode of dialysis 6/21, tolerating subsequent dialysis well Continue strict I/O for now, collect urine-nursing informed daily renal panel changed to every other day with dialysis Dispo dependent on Medicaid authorization, no transport-family going to [facility] office tomorrow to work out transport details AWAITING CLIP, d/c pt possibly today?  Renal acidosis L AV fistula placement 05/14/2016--currently non functional AVF Appreciate [facility] input re: [facility] 6/22, fistulogram as per Dr. Donzetta Matters as OP  Secondary hyperparathyroidism-on binders per nephrology   Fever of unknown origin Only 1 time fever 102, follow BC X 2, urine culture from 6/26 CXR is negative If further fever without source would cover broadly with vancomycin and Zosyn and look for other causes of fever such as autoimmune etc. Afebrile this am  Anemia, microcytic Patient has never had a colonoscopy, hemoccult neg 6/22 nulecit x 1 given 6/22, 1 unit PRBC 6/22 External thrombosed hemorrhoid 6:00, 4:00--had some bleeding from this on this admission and needed transfusion 1 unit PRBC 11/11/2016 --if further bleeding will need GI involvement [discussed with Ms Gribbin6/26 who agrees that unless further bleeding no need for inpatient workup-might need surgical input if bleeds along with colonoscopy  however] Ferritin 1,440, Tsat low, ? RES blockade vs Functional iron def--CRP 2.4 indicative of potential RES blockade Hemoglobin 7.0repeat labs in 6/25-->7.8 on 6/27 Hgb stable  Thrombocytopenia mild, probably from overall iron deficiency vs HIT--  Now has low-grade thrombocyte with platelets in the 140 range.  sending HIT panel 6/25 (negative) place on SCDs 6/25 discontinue heparin Seems to be resolving on its own, trend 141-110->>121->140  CAD-microvascular disease based on cath [id number] Chronic diastolic heart failure without acute component, EF 02% htn with complication of hypertensive glomerulosclerosis Management will now be via dialysis DC off of Lasix 160 bid per nephrology since 6/21, Off chlorthalidone 25 daily Dosing of Coreg 12.5 bid per nephrologist, he has not been taking Procardia XL 90 mg for CAD  Diabetic neuropathy, nephropathy type II since last 20 years Continue Lantus 15 units Continue sensitive sliding scale coverage-sugars 132-150 Continue gabapentin 300 daily for neuropathy  Hyperlipidemia continue Lipitor  Moderate malnutrition Consider supplements with NePro, protein level is less than 3  Full CODE STATUS SCD Inpatient Social work working on [employer] Patient will need Medicare Medicaid transport to and from dialysis prior to discharge   Consultants:  Nephrology  Vascular surgery  Procedures:  Dialysis  Temporary dialysis access placed in right upper chest 6/22  Paracentesis 6/26 5L by IR  Antimicrobials:  None   Lab Results  Component Value Date   PLT 127 (L) 11/16/2016     Anti-infectives    Start     Dose/Rate Route Frequency Ordered Stop   11/08/16 1500  ceFAZolin (ANCEF) IVPB 1 g/50 mL premix     1 g 100 mL/hr over 30 Minutes Intravenous   Once 11/08/16 1457 11/08/16 1514   11/08/16 1458  ceFAZolin (ANCEF) 1-4 GM/50ML-% IVPB    Comments:  [name]   : cabinet override      11/08/16 1458 11/08/16 1514        Objective:   Vitals:   11/15/16 0954 11/15/16 1708 11/15/16 2205 11/16/16 0456  BP: (!) 155/60 (!) 172/68 (!) 157/68 (!) 127/54  Pulse: (!) 58 71 73 (!) 59  Resp: 18 18 18 18   Temp: 98 F (36.7 C) 98 F (  36.7 C) (!) 100.9 F (38.3 C) 99.3 F (37.4 C)  TempSrc: Oral Oral Oral Oral  SpO2: 98% 98% 100% 100%  Weight:   74.8 kg (165 lb)   Height:        Wt Readings from Last 3 Encounters:  11/15/16 74.8 kg (165 lb)  09/12/16 78.9 kg (174 lb)  09/02/16 87.5 kg (193 lb)     Intake/Output Summary (Last 24 hours) at 11/16/16 0651 Last data filed at 11/16/16 0456  Gross per 24 hour  Intake              550 ml  Output              200 ml  Net              350 ml     Physical Exam  Awake Alert, Oriented X 3, No new F.N deficits, Normal affect [place].AT,PERRAL Supple Neck,No JVD, No cervical lymphadenopathy appriciated.  Symmetrical Chest wall movement, Good air movement bilaterally, CTAB RRR,No Gallops,Rubs or new Murmurs, No Parasternal Heave +ve B.Sounds, Abd Soft, No tenderness, No organomegaly appriciated, No rebound - guarding or rigidity. No Cyanosis, Clubbing or edema, No new Rash or bruise      Data Review:    CBC  Recent Labs Lab 11/12/16 0756 11/13/16 0237 11/14/16 0418 11/14/16 0646 11/16/16 0330  WBC 6.5 6.3 6.0 6.4 6.1  HGB 7.8* 8.0* 7.8* 7.9* 7.7*  HCT 23.6* 24.5* 24.3* 24.2* 23.8*  PLT 121* 145* 140* 139* 127*  MCV 82.8 84.8 84.7 84.6 86.5  MCH 27.4 27.7 27.2 27.6 28.0  MCHC 33.1 32.7 32.1 32.6 32.4  RDW 14.0 14.2 14.2 14.2 14.5  LYMPHSABS 0.7  --   --   --   --   MONOABS 0.7  --   --   --   --   EOSABS 0.5  --   --   --   --   BASOSABS 0.0  --   --   --   --     Chemistries   Recent Labs Lab 11/11/16 0353 11/13/16 0237 11/14/16 0418 11/14/16 0646  11/16/16 0330  NA 126* 130* 131* 130* 134*  K 4.0 3.6 4.0 4.0 4.5  CL 94* 96* 97* 97* 98*  CO2 22 26 27 25 24   GLUCOSE 133* 90 190* 194* 92  BUN 79* 60* 43* 44* 46*  CREATININE 5.73* 5.31* 4.93* 4.99* 5.01*  CALCIUM 8.0* 8.1* 7.9* 7.9* 8.0*  AST  --   --  112*  --  142*  ALT  --   --  133*  --  173*  ALKPHOS  --   --  337*  --  345*  BILITOT  --   --  0.6  --  0.8   ------------------------------------------------------------------------------------------------------------------ No results for input(s): CHOL, HDL, LDLCALC, TRIG, CHOLHDL, LDLDIRECT in the last 72 hours.  Lab Results  Component Value Date   HGBA1C 7.4 09/02/2016   ------------------------------------------------------------------------------------------------------------------ No results for input(s): TSH, T4TOTAL, T3FREE, THYROIDAB in the last 72 hours.  Invalid input(s): FREET3 ------------------------------------------------------------------------------------------------------------------ No results for input(s): VITAMINB12, FOLATE, FERRITIN, TIBC, IRON, RETICCTPCT in the last 72 hours.  Coagulation profile No results for input(s): INR, PROTIME in the last 168 hours.  No results for input(s): DDIMER in the last 72 hours.  Cardiac Enzymes No results for input(s): CKMB, TROPONINI, MYOGLOBIN in the last 168 hours.  Invalid input(s): CK ------------------------------------------------------------------------------------------------------------------    Component Value Date/Time   BNP 1,114.8 (H) 02/27/2015  1452    Inpatient Medications  Scheduled Meds: . aspirin  81 mg Oral Daily  . atorvastatin  40 mg Oral q1800  . calcitRIOL  0.25 mcg Oral Daily  . carvedilol  12.5 mg Oral BID WC  . darbepoetin (ARANESP) injection - DIALYSIS  150 mcg Intravenous Q Sat-HD  . feeding supplement (NEPRO CARB STEADY)  237 mL Oral BID BM  . gabapentin  300 mg Oral Daily  . hydrocortisone   Rectal BID  . insulin aspart   0-9 Units Subcutaneous TID WC  . insulin glargine  15 Units Subcutaneous QHS  . multivitamin  1 tablet Oral QHS  . polyethylene glycol  17 g Oral Daily   Continuous Infusions: PRN Meds:.acetaminophen **OR** acetaminophen, ipratropium-albuterol, lidocaine (PF), ondansetron **OR** ondansetron (ZOFRAN) IV  Micro Results Recent Results (from the past 240 hour(s))  MRSA PCR Screening     Status: None   Collection Time: 11/07/16 10:15 AM  Result Value Ref Range Status   MRSA by PCR NEGATIVE NEGATIVE Final    Comment:        The GeneXpert MRSA Assay (FDA approved for NASAL specimens only), is one component of a comprehensive MRSA colonization surveillance program. It is not intended to diagnose MRSA infection nor to guide or monitor treatment for MRSA infections.   Surgical PCR screen     Status: None   Collection Time: 11/08/16  2:09 PM  Result Value Ref Range Status   MRSA, PCR NEGATIVE NEGATIVE Final   Staphylococcus aureus NEGATIVE NEGATIVE Final    Comment:        The Xpert SA Assay (FDA approved for NASAL specimens in patients over 2 years of age), is one component of a comprehensive surveillance program.  Test performance has been validated by [facility] for patients greater than or equal to 30 year old. It is not intended to diagnose infection nor to guide or monitor treatment.   Culture, blood (routine x 2)     Status: None (Preliminary result)   Collection Time: 11/12/16  7:56 AM  Result Value Ref Range Status   Specimen Description BLOOD LEFT ARM  Final   Special Requests   Final    BOTTLES DRAWN AEROBIC AND ANAEROBIC Blood Culture adequate volume   Culture NO GROWTH 3 DAYS  Final   Report Status PENDING  Incomplete  Culture, blood (routine x 2)     Status: None (Preliminary result)   Collection Time: 11/12/16  7:56 AM  Result Value Ref Range Status   Specimen Description BLOOD LEFT HAND  Final   Special Requests   Final    BOTTLES DRAWN AEROBIC ONLY  Blood Culture adequate volume   Culture NO GROWTH 3 DAYS  Final   Report Status PENDING  Incomplete  Culture, body fluid-bottle     Status: None (Preliminary result)   Collection Time: 11/13/16  2:59 PM  Result Value Ref Range Status   Specimen Description PERITONEAL  Final   Special Requests NONE  Final   Culture NO GROWTH 2 DAYS  Final   Report Status PENDING  Incomplete  Gram stain     Status: None   Collection Time: 11/13/16  2:59 PM  Result Value Ref Range Status   Specimen Description PERITONEAL  Final   Special Requests NONE  Final   Gram Stain   Final    RARE WBC PRESENT,BOTH PMN AND MONONUCLEAR NO ORGANISMS SEEN    Report Status 11/13/2016 FINAL  Final    Radiology  Reports Dg Chest Port 1 View  Result Date: 11/12/2016 CLINICAL DATA:  Fevers EXAM: PORTABLE CHEST 1 VIEW COMPARISON:  11/08/2016 FINDINGS: Cardiac shadow remains enlarged. Dialysis catheter is again noted in satisfactory position. Interval clearing in the bases bilaterally is noted. No focal confluent infiltrate or sizable effusion is seen. No bony abnormality is noted. IMPRESSION: Improved aeration in the bases when compared with the prior exam Electronically Signed   By: Inez Catalina M.D.   On: 11/12/2016 07:19   Dg Chest Port 1 View  Result Date: 11/08/2016 CLINICAL DATA:  Central catheter placement EXAM: PORTABLE CHEST 1 VIEW COMPARISON:  November 06, 2016 FINDINGS: Central catheter tip is in the right atrium approximately 5 cm distal to the superior vena cava -right atrium junction. No pneumothorax. There is stable cardiomegaly with pulmonary vascularity within normal limits. There is atelectatic change in the lung bases. There is trace interstitial edema in the perihilar regions. Lungs elsewhere are clear. No adenopathy appreciable. IMPRESSION: Central catheter tip in right atrium. No pneumothorax. Stable cardiomegaly. Question underlying pericardial effusion given the cardiac silhouette. Bibasilar atelectasis.  Slight interstitial edema in the perihilar regions. Lungs elsewhere clear. Electronically Signed   By: Lowella Grip III M.D.   On: 11/08/2016 16:06   Dg Chest Port 1 View  Result Date: 11/06/2016 CLINICAL DATA:  Initial evaluation for acute shortness of breath, abdominal pain. EXAM: PORTABLE CHEST 1 VIEW COMPARISON:  Prior radiograph from 03/12/2016. FINDINGS: Advanced cardiomegaly.  Mediastinal silhouette normal. Lungs hypoinflated. Perihilar vascular congestion with mild interstitial prominence, suggesting mild pulmonary interstitial edema. No frank alveolar edema. No focal infiltrates. No pleural effusion. No pneumothorax. No acute osseous abnormality. IMPRESSION: Cardiomegaly with mild diffuse pulmonary interstitial congestion/ edema. Electronically Signed   By: Jeannine Boga M.D.   On: 11/06/2016 02:51   Dg Fluoro Guide Cv Line-no Report  Result Date: 11/08/2016 Fluoroscopy was utilized by the requesting physician.  No radiographic interpretation.   Ir Paracentesis  Result Date: 11/13/2016 INDICATION: Patient with chronic kidney disease and volume overload. Request is made for diagnostic and therapeutic paracentesis. EXAM: ULTRASOUND GUIDED DIAGNOSTIC AND THERAPEUTIC PARACENTESIS MEDICATIONS: 10 mL 1% lidocaine COMPLICATIONS: None immediate. PROCEDURE: Informed written consent was obtained from the patient after a discussion of the risks, benefits and alternatives to treatment. A timeout was performed prior to the initiation of the procedure. Initial ultrasound scanning demonstrates a large amount of ascites within the right lateral abdomen. The right lateral abdomen was prepped and draped in the usual sterile fashion. 1% lidocaine was used for local anesthesia. Following this, a 19 gauge, 7-cm, Yueh catheter was introduced. An ultrasound image was saved for documentation purposes. The paracentesis was performed. The catheter was removed and a dressing was applied. The patient tolerated  the procedure well without immediate post procedural complication. FINDINGS: A total of approximately 5.0 liters of yellow fluid was removed. Samples were sent to the laboratory as requested by the clinical team. IMPRESSION: Successful ultrasound-guided diagnostic and therapeutic paracentesis yielding 5.0 liters of peritoneal fluid. Read by:  Brynda Greathouse PA-C Electronically Signed   By: Marybelle Killings M.D.   On: 11/13/2016 15:30    Time Spent in minutes  30   Jani Gravel M.D on 11/16/2016 at 6:51 AM  Between 7am to 7pm - Pager - [phone]  After 7pm go to [email or web address] - password [facility]  [facility] -  Office  [phone]

## 2016-11-16 NOTE — Progress Notes (Signed)
AVS states that the D/C order reconciliation is not complete. MD notified. Will continue to monitor.

## 2016-11-16 NOTE — Progress Notes (Signed)
Patient currently does not have a D/C order and AVS states that the D/C order reconciliation needs to be complete. Maudie Mercury, MD notified. Will continue to monitor.

## 2016-11-16 NOTE — Progress Notes (Signed)
Russell Price Progress Note   Subjective:  CLIP'd to [facility] and ready for dc after HD today. Will start on Tuesday 7/3, see SW notes.   Objective Vitals:   11/15/16 0954 11/15/16 1708 11/15/16 2205 11/16/16 0456  BP: (!) 155/60 (!) 172/68 (!) 157/68 (!) 127/54  Pulse: (!) 58 71 73 (!) 59  Resp: 18 18 18 18   Temp: 98 F (36.7 C) 98 F (36.7 C) (!) 100.9 F (38.3 C) 99.3 F (37.4 C)  TempSrc: Oral Oral Oral Oral  SpO2: 98% 98% 100% 100%  Weight:   74.8 kg (165 lb)   Height:       Physical Exam General: awake, alert, NAD Heart: S1,S2, RRR Lungs: CTAB A/P Abdomen: ascites decreased, non-tender Extremities: trace ankle and pedal edema bilaterally. Dialysis Access: LUA AVF + bruit RIJ Keysi [facility] CDI   Additional Objective Labs: Basic Metabolic Panel:  Recent Labs Lab 11/11/16 0353 11/13/16 0237 11/14/16 0418 11/14/16 0646 11/16/16 0330  NA 126* 130* 131* 130* 134*  K 4.0 3.6 4.0 4.0 4.5  CL 94* 96* 97* 97* 98*  CO2 22 26 27 25 24   GLUCOSE 133* 90 190* 194* 92  BUN 79* 60* 43* 44* 46*  CREATININE 5.73* 5.31* 4.93* 4.99* 5.01*  CALCIUM 8.0* 8.1* 7.9* 7.9* 8.0*  PHOS 4.9* 4.2  --  3.6  --    Liver Function Tests:  Recent Labs Lab 11/14/16 0418 11/14/16 0646 11/16/16 0330  AST 112*  --  142*  ALT 133*  --  173*  ALKPHOS 337*  --  345*  BILITOT 0.6  --  0.8  PROT 5.3*  --  5.3*  ALBUMIN 2.1* 2.1* 2.1*   No results for input(s): LIPASE, AMYLASE in the last 168 hours. CBC:  Recent Labs Lab 11/12/16 0756 11/13/16 0237 11/14/16 0418 11/14/16 0646 11/16/16 0330  WBC 6.5 6.3 6.0 6.4 6.1  NEUTROABS 4.7  --   --   --   --   HGB 7.8* 8.0* 7.8* 7.9* 7.7*  HCT 23.6* 24.5* 24.3* 24.2* 23.8*  MCV 82.8 84.8 84.7 84.6 86.5  PLT 121* 145* 140* 139* 127*   Blood Culture    Component Value Date/Time   SDES PERITONEAL 11/13/2016 1459   SDES PERITONEAL 11/13/2016 1459   SPECREQUEST NONE 11/13/2016 1459   SPECREQUEST NONE 11/13/2016 1459   CULT NO GROWTH 2 DAYS 11/13/2016 1459   REPTSTATUS PENDING 11/13/2016 1459   REPTSTATUS 11/13/2016 FINAL 11/13/2016 1459    Cardiac Enzymes: No results for input(s): CKTOTAL, CKMB, CKMBINDEX, TROPONINI in the last 168 hours. CBG:  Recent Labs Lab 11/15/16 0757 11/15/16 1137 11/15/16 1630 11/15/16 2159 11/16/16 0757  GLUCAP 197* 236* 279* 173* 108*   Iron Studies: No results for input(s): IRON, TIBC, TRANSFERRIN, FERRITIN in the last 72 hours. @lablastinr3 @ Studies/Results: No results found. Medications:  . aspirin  81 mg Oral Daily  . atorvastatin  40 mg Oral q1800  . calcitRIOL  0.25 mcg Oral Daily  . carvedilol  12.5 mg Oral BID WC  . darbepoetin (ARANESP) injection - DIALYSIS  150 mcg Intravenous Q Sat-HD  . feeding supplement (NEPRO CARB STEADY)  237 mL Oral BID BM  . gabapentin  300 mg Oral Daily  . hydrocortisone   Rectal BID  . insulin aspart  0-9 Units Subcutaneous TID WC  . insulin glargine  15 Units Subcutaneous QHS  . multivitamin  1 tablet Oral QHS  . polyethylene glycol  17 g Oral Daily  HD Orders: new ESRD, going 4h, max UF, standard heparin, 400/ 800, using TDC/ AVF resting  Assessment/Plan: 1. CKD V/  new ESRD-1st HD 11/07/16 but had issues with infiltration of AVF. TDC placed. CLIP'd to [facility]. See SW notes.  2.  Vol overload / ascites - much better w serial HD and paracentesis 5L on 6/27 3. Anemia - sp prbc's, cont ESA, will ^ to 150/ wk. 6/21 tsat was 41% so no Fe needed.   4. MBD of CKD - Phos 3.6 Ca 7.9C Ca 9.4. On daily calcitriol, no binders.  5. HTN -  Cont Coreg 6. Nutrition - Albumin 2.3. Renal/Carb mod diet. Nephro/renal vits 7. DM: per primary 8. H/O CAD. No chest pain   Kelly Splinter MD [facility] pager [phone]   11/16/2016, 9:12 AM

## 2016-11-16 NOTE — Progress Notes (Signed)
Russell Price to be D/C'd Home per MD order.  Discussed prescriptions and follow up appointments with the patient. Prescriptions given to patient, medication list explained in detail. Pt verbalized understanding.  Allergies as of 11/16/2016      Reactions   No Known Allergies       Medication List    STOP taking these medications   chlorthalidone 25 MG tablet Commonly known as:  HYGROTON   furosemide 80 MG tablet Commonly known as:  LASIX   NIFEdipine 90 MG 24 hr tablet Commonly known as:  PROCARDIA XL/ADALAT-CC     TAKE these medications   ACCU-CHEK AVIVA device Use as instructed 3 times daily before meals and at bedtime.   accu-chek softclix lancets Use as instructed 3 times daily before meals and at bedtime   aspirin 81 MG chewable tablet Chew 1 tablet (81 mg total) by mouth daily.   atorvastatin 40 MG tablet Commonly known as:  LIPITOR Take 1 tablet (40 mg total) by mouth daily at 6 PM.   calcitRIOL 0.25 MCG capsule Commonly known as:  ROCALTROL Take 0.25 mcg by mouth daily.   carvedilol 12.5 MG tablet Commonly known as:  COREG Take 12.5 mg by mouth 2 (two) times daily with a meal. What changed:  Another medication with the same name was removed. Continue taking this medication, and follow the directions you see here.   Darbepoetin Alfa 150 MCG/0.3ML Sosy injection Commonly known as:  ARANESP Inject 0.3 mLs (150 mcg total) into the vein every Saturday with hemodialysis.   feeding supplement (NEPRO CARB STEADY) Liqd Take 237 mLs by mouth 2 (two) times daily between meals.   gabapentin 300 MG capsule Commonly known as:  NEURONTIN Take 1 capsule (300 mg total) by mouth daily.   glucose blood test strip Commonly known as:  ACCU-CHEK AVIVA PLUS Use 3 times daily before meals and at bedtime   hydrocortisone 2.5 % rectal cream Commonly known as:  ANUSOL-HC Place rectally 2 (two) times daily.   insulin aspart 100 UNIT/ML injection Commonly known as:   novoLOG Inject 2-10 Units into the skin 3 (three) times daily before meals.   insulin glargine 100 UNIT/ML injection Commonly known as:  LANTUS Inject 0.15 mLs (15 Units total) into the skin at bedtime.   multivitamin Tabs tablet Take 1 tablet by mouth daily.   polyethylene glycol packet Commonly known as:  MIRALAX / GLYCOLAX Take 17 g by mouth daily.       Vitals:   11/16/16 1230 11/16/16 1235  BP: (!) 152/65 (!) 150/65  Pulse: (!) 59 64  Resp:  18  Temp:  98.8 F (37.1 C)    Skin clean, dry and intact without evidence of skin break down, no evidence of skin tears noted. IV catheter discontinued intact. Site without signs and symptoms of complications. Dressing and pressure applied. Pt denies pain at this time. No complaints noted.  An After Visit Summary was printed and given to the patient. Patient escorted via [place], and D/C home via private auto.  Dixie Dials RN, BSN

## 2016-11-16 NOTE — Discharge Summary (Addendum)
Russell Price, is a 56 y.o. male  DOB [birth year]  MRN [id number].  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    L AV fistula placement 05/14/2016--currently nonfunctional AVF Appreciate VVS input re: [facility] 6/22 Fistulogram as per Dr. Donzetta Matters as OP  Secondary hyperparathyroidism on binders per nephrology  Anemia, microcytic Patient has never had a colonoscopy, consider as outpatient hemoccult neg 6/22 Needed 1 unit prbc 6/25  External thrombosed hemorrhoid  had some bleeding from this on this admission and needed transfusion 1 unit PRBC 06/25  Thrombocytopenia mild,  HIT negative Check cbc in 1-2 weeks  Ascites s/p paracentesis 6/27  Monitor clinically   CAD-microvascular disease based on cath 1016 1324 Chronic diastolic heart failure without acute component, EF 55% Cont aspirin carvedilol, lipitor  Htn with complication of hypertensive glomerulosclerosis Continue carvedilol  Diabetic neuropathy, nephropathy type II since last 20 years Continue Lantus 15 units Continue gabapentin 300 daily for neuropathy  Moderate malnutrition Nephro  Consultants:  Nephrology  Vascular surgery  Procedures:  Dialysis  Temporary dialysis access placed in right upper chest 6/22  Paracentesis 6/26 5L by IR   Admission Diagnosis  Hyponatremia [E87.1] Peripheral edema [R60.9] SOB (shortness of breath) [R06.02] ESRD (end stage renal disease) ([place]) [N18.6]   Discharge  Diagnosis  Hyponatremia [E87.1] Peripheral edema [R60.9] SOB (shortness of breath) [R06.02] ESRD (end stage renal disease) ([place]) [N18.6]    Principal Problem:   Hyponatremia Active Problems:   Essential hypertension   DM (diabetes mellitus) with complications (HCC)   Anemia of chronic disease   ESRD (end stage renal disease) (HCC)   Fluid overload   Ascites      Past Medical History:  Diagnosis Date  . CAD (coronary artery disease)    a. underwent cath in 02/2016 after an abnormal nuc which showed 2V CAD in RCA and OM3 with no good targets for PCI. Medical managment recommended.   . Chronic kidney disease   . Diabetes mellitus without complication ([place])    followed by Dr Chalmers Cater  . ESRD (end stage renal disease) ([place])   . Glaucoma   . Hypertension    followed by [facility]  . Stroke [facility])     Past Surgical History:  Procedure Laterality Date  . AV FISTULA PLACEMENT Left 03/12/2016   Procedure: Left Arm ARTERIOVENOUS (AV) FISTULA CREATION;  Surgeon: Waynetta Sandy, MD;  Location: [place];  Service: Vascular;  Laterality: Left;  . BASCILIC VEIN TRANSPOSITION Left 05/14/2016   Procedure: SECOND STAGE LEFT BASILIC VEIN TRANSPOSITION;  Surgeon: Waynetta Sandy, MD;  Location: [place];  Service: Vascular;  Laterality: Left;  . BRONCHOSCOPY  02/14/2015   for pulm hemorrhage  . CARDIAC CATHETERIZATION N/A 03/04/2016   Procedure: Left Heart Cath and Coronary Angiography;  Surgeon: Leonie Man, MD;  Location: [place] [facility];  Service: Cardiovascular;  Laterality: N/A;  . EYE SURGERY Left   . INSERTION OF DIALYSIS CATHETER Right 11/08/2016   Procedure: INSERTION OF DIALYSIS CATHETER;  Surgeon: Rosetta Posner, MD;  Location: [facility] OR;  Service: Vascular;  Laterality: Right;  . IR PARACENTESIS  11/13/2016       HPI  from the history and physical done on the day of admission:    56 y.o. male with medical history significant of ESRD, HTN, CAD, DM type  II, and CVA; who presents after being instructed to come in for abnormal lab work. Patient was noted to have significantly low sodium levels. He has been drinking approximately four 16 oz bottles of water up to 1 gallon of water daily. He still is able to make urine currently. Patient reports over the last week having increasing abdominal and lower extremity swelling. Associated symptoms included shortness of breath and increased lethargy. Family present at bedside deny any signs of confusion. Patient denies having any fever, chills, nausea, vomiting, diarrhea, chest pain, or cough. He is followed by Dr. Moshe Cipro of nephrology and she recently wanted him to increase his Lasix 160 mg twice a day.  ED Course: Upon admission to the emergency department patient was seen to be  afebrile, blood pressure 175/85, and all other vital signs within normal limits. Labs revealed hemoglobin 8.3, sodium 1:15, potassium 5.2, chloride 86, CO2 13, BUN 1:30, creatinine 6.85, calcium 7.3, glucose 132. Nephrology was consulted and recommended giving the patient 160 mg Lasix IV 1 dose. Chest x-ray showing cardiomegaly with mild diffuse pulmonary edema.       Hospital Course:     Pt was admitted for severe hyponatremia 115 secondary to psychogenic polydipsia? And volume overload due to oliguric renal failure.  Nephrology was consulted and pt was started on dialysis for ESRD.  Pt has maturing AV fistula left arm malfunctioning 6/22 and therefore a TDC was place on 6/22.  Since he was started on dialysis his lasix and chlorthalidone were discontinued.  There was some concern for HIT but HIT panel was negative on 6/25.  Pt had paracentesis 5L on 6/27  Pt was awaiting CLIP and [facility].  Pt is on a TTS dialysis schedule.    Follow UP  Follow-up Information    Arnoldo Morale, MD Follow up in 1 week(s).   Specialty:  Family Medicine Contact information: [place]  [id number] [phone]        Roney Jaffe, MD Follow up in 2 week(s).   Specialty:  Nephrology Contact information: [place] [address] [phone]            Consults obtained - nephrology, IR  Discharge Condition: stable  Diet and Activity recommendation: See Discharge Instructions below  Discharge Instructions         Discharge Medications     Allergies as of 11/16/2016      Reactions   No Known Allergies       Medication List    STOP taking these medications   chlorthalidone 25 MG tablet Commonly known as:  HYGROTON   furosemide 80 MG tablet Commonly known as:  LASIX   NIFEdipine 90 MG 24 hr tablet Commonly known as:  PROCARDIA XL/ADALAT-CC     TAKE these medications   ACCU-CHEK AVIVA device Use as instructed 3 times daily before meals and at bedtime.   accu-chek softclix lancets Use as instructed 3 times daily before meals and at bedtime   aspirin 81 MG chewable tablet Chew 1 tablet (81 mg total) by mouth daily.   atorvastatin 40 MG tablet Commonly known as:  LIPITOR Take 1 tablet (40 mg total) by mouth daily at 6 PM.   calcitRIOL 0.25 MCG capsule Commonly known as:  ROCALTROL Take 0.25 mcg by mouth daily.   carvedilol 12.5 MG tablet Commonly known as:  COREG Take 12.5 mg by mouth 2 (two) times daily with a meal. What changed:  Another medication with the same name was removed. Continue taking this medication, and follow the directions you see here.   Darbepoetin Alfa 150 MCG/0.3ML Sosy injection Commonly known as:  ARANESP Inject 0.3 mLs (150 mcg total) into the vein every Saturday with hemodialysis.   feeding supplement (NEPRO CARB STEADY) Liqd Take 237 mLs by mouth 2 (two) times daily between meals.   gabapentin 300 MG capsule Commonly known as:  NEURONTIN Take 1 capsule (300 mg total) by mouth daily.   glucose blood test strip Commonly known as:  ACCU-CHEK AVIVA PLUS Use 3 times daily before meals and at  bedtime   hydrocortisone 2.5 % rectal cream Commonly known as:  ANUSOL-HC Place rectally 2 (two) times daily.   insulin aspart 100 UNIT/ML injection Commonly known as:  novoLOG Inject 2-10 Units into the skin 3 (three) times daily before meals.   insulin glargine 100 UNIT/ML injection Commonly known as:  LANTUS Inject 0.15 mLs (15 Units total) into the skin at bedtime.   multivitamin Tabs tablet Take 1 tablet by mouth daily.   polyethylene glycol packet Commonly known as:  MIRALAX / GLYCOLAX Take 17 g by mouth daily.       Major procedures and Radiology Reports - PLEASE review detailed and final reports for all details, in brief -      Dg Chest Port 1 View  Result Date: 11/12/2016 CLINICAL DATA:  Fevers EXAM: PORTABLE CHEST 1 VIEW COMPARISON:  11/08/2016 FINDINGS: Cardiac shadow remains enlarged. Dialysis catheter is again noted in satisfactory position. Interval clearing in the bases bilaterally is noted. No focal confluent infiltrate or sizable effusion is seen. No bony abnormality is noted. IMPRESSION: Improved aeration in the bases when compared with the prior exam Electronically Signed   By: Inez Catalina M.D.   On: 11/12/2016 07:19   Dg Chest Port 1 View  Result Date: 11/08/2016 CLINICAL DATA:  Central catheter placement EXAM: PORTABLE CHEST 1 VIEW COMPARISON:  November 06, 2016 FINDINGS: Central catheter tip is in the right atrium approximately 5 cm distal to the superior vena cava -right atrium junction. No pneumothorax. There is stable cardiomegaly with pulmonary vascularity within normal limits. There is atelectatic change in the lung bases. There is trace interstitial edema in the perihilar regions. Lungs elsewhere are clear. No adenopathy appreciable. IMPRESSION: Central catheter tip in right atrium. No pneumothorax. Stable cardiomegaly. Question underlying pericardial effusion given the cardiac silhouette. Bibasilar atelectasis. Slight interstitial edema in the perihilar  regions. Lungs elsewhere clear. Electronically Signed   By: Lowella Grip III M.D.   On: 11/08/2016 16:06   Dg Chest Port 1 View  Result Date: 11/06/2016 CLINICAL DATA:  Initial evaluation for acute shortness of breath, abdominal pain. EXAM: PORTABLE CHEST 1 VIEW COMPARISON:  Prior radiograph from 03/12/2016.  FINDINGS: Advanced cardiomegaly.  Mediastinal silhouette normal. Lungs hypoinflated. Perihilar vascular congestion with mild interstitial prominence, suggesting mild pulmonary interstitial edema. No frank alveolar edema. No focal infiltrates. No pleural effusion. No pneumothorax. No acute osseous abnormality. IMPRESSION: Cardiomegaly with mild diffuse pulmonary interstitial congestion/ edema. Electronically Signed   By: Jeannine Boga M.D.   On: 11/06/2016 02:51   Dg Fluoro Guide Cv Line-no Report  Result Date: 11/08/2016 Fluoroscopy was utilized by the requesting physician.  No radiographic interpretation.   Ir Paracentesis  Result Date: 11/13/2016 INDICATION: Patient with chronic kidney disease and volume overload. Request is made for diagnostic and therapeutic paracentesis. EXAM: ULTRASOUND GUIDED DIAGNOSTIC AND THERAPEUTIC PARACENTESIS MEDICATIONS: 10 mL 1% lidocaine COMPLICATIONS: None immediate. PROCEDURE: Informed written consent was obtained from the patient after a discussion of the risks, benefits and alternatives to treatment. A timeout was performed prior to the initiation of the procedure. Initial ultrasound scanning demonstrates a large amount of ascites within the right lateral abdomen. The right lateral abdomen was prepped and draped in the usual sterile fashion. 1% lidocaine was used for local anesthesia. Following this, a 19 gauge, 7-cm, Yueh catheter was introduced. An ultrasound image was saved for documentation purposes. The paracentesis was performed. The catheter was removed and a dressing was applied. The patient tolerated the procedure well without immediate post  procedural complication. FINDINGS: A total of approximately 5.0 liters of yellow fluid was removed. Samples were sent to the laboratory as requested by the clinical team. IMPRESSION: Successful ultrasound-guided diagnostic and therapeutic paracentesis yielding 5.0 liters of peritoneal fluid. Read by:  Brynda Greathouse PA-C Electronically Signed   By: Marybelle Killings M.D.   On: 11/13/2016 15:30    Micro Results     Recent Results (from the past 240 hour(s))  MRSA PCR Screening     Status: None   Collection Time: 11/07/16 10:15 AM  Result Value Ref Range Status   MRSA by PCR NEGATIVE NEGATIVE Final    Comment:        The GeneXpert MRSA Assay (FDA approved for NASAL specimens only), is one component of a comprehensive MRSA colonization surveillance program. It is not intended to diagnose MRSA infection nor to guide or monitor treatment for MRSA infections.   Surgical PCR screen     Status: None   Collection Time: 11/08/16  2:09 PM  Result Value Ref Range Status   MRSA, PCR NEGATIVE NEGATIVE Final   Staphylococcus aureus NEGATIVE NEGATIVE Final    Comment:        The Xpert SA Assay (FDA approved for NASAL specimens in patients over 103 years of age), is one component of a comprehensive surveillance program.  Test performance has been validated by [facility] for patients greater than or equal to 66 year old. It is not intended to diagnose infection nor to guide or monitor treatment.   Culture, blood (routine x 2)     Status: None (Preliminary result)   Collection Time: 11/12/16  7:56 AM  Result Value Ref Range Status   Specimen Description BLOOD LEFT ARM  Final   Special Requests   Final    BOTTLES DRAWN AEROBIC AND ANAEROBIC Blood Culture adequate volume   Culture NO GROWTH 3 DAYS  Final   Report Status PENDING  Incomplete  Culture, blood (routine x 2)     Status: None (Preliminary result)   Collection Time: 11/12/16  7:56 AM  Result Value Ref Range Status   Specimen  Description BLOOD LEFT HAND  Final  Special Requests   Final    BOTTLES DRAWN AEROBIC ONLY Blood Culture adequate volume   Culture NO GROWTH 3 DAYS  Final   Report Status PENDING  Incomplete  Culture, body fluid-bottle     Status: None (Preliminary result)   Collection Time: 11/13/16  2:59 PM  Result Value Ref Range Status   Specimen Description PERITONEAL  Final   Special Requests NONE  Final   Culture NO GROWTH 2 DAYS  Final   Report Status PENDING  Incomplete  Gram stain     Status: None   Collection Time: 11/13/16  2:59 PM  Result Value Ref Range Status   Specimen Description PERITONEAL  Final   Special Requests NONE  Final   Gram Stain   Final    RARE WBC PRESENT,BOTH PMN AND MONONUCLEAR NO ORGANISMS SEEN    Report Status 11/13/2016 FINAL  Final       Today   Subjective    Danner Vanderheyden today has no headache,no chest abdominal pain,no new weakness tingling or numbness, feels much better wants to go home today.    Objective   Blood pressure (!) 150/65, pulse 64, temperature 98.8 F (37.1 C), temperature source Oral, resp. rate 18, height 5\' 11"  (1.803 m), weight 73.7 kg (162 lb 7.7 oz), SpO2 100 %.   Intake/Output Summary (Last 24 hours) at 11/16/16 1453 Last data filed at 11/16/16 1330  Gross per 24 hour  Intake              540 ml  Output             2700 ml  Net            -2160 ml    Exam Awake Alert, Oriented x 3, No new F.N deficits, Normal affect [place].AT,PERRAL Supple Neck,No JVD, No cervical lymphadenopathy appriciated.  Symmetrical Chest wall movement, Good air movement bilaterally, CTAB RRR,No Gallops,Rubs or new Murmurs, No Parasternal Heave +ve B.Sounds, Abd Soft, Non tender, No organomegaly appriciated, No rebound -guarding or rigidity. No Cyanosis, Clubbing or edema, No new Rash or bruise   Data Review   CBC w Diff:  Lab Results  Component Value Date   WBC 6.0 11/16/2016   HGB 7.4 (L) 11/16/2016   HCT 22.6 (L) 11/16/2016   PLT 132 (L)  11/16/2016   LYMPHOPCT 11 11/12/2016   MONOPCT 10 11/12/2016   EOSPCT 7 11/12/2016   BASOPCT 1 11/12/2016    CMP:  Lab Results  Component Value Date   NA 131 (L) 11/16/2016   NA 132 (L) 09/02/2016   K 4.2 11/16/2016   CL 96 (L) 11/16/2016   CO2 27 11/16/2016   BUN 48 (H) 11/16/2016   BUN 111 (HH) 09/02/2016   CREATININE 5.14 (H) 11/16/2016   CREATININE 4.38 (H) 03/01/2016   PROT 5.3 (L) 11/16/2016   PROT 6.5 09/02/2016   ALBUMIN 2.2 (L) 11/16/2016   ALBUMIN 3.5 09/02/2016   BILITOT 0.8 11/16/2016   BILITOT 0.5 09/02/2016   ALKPHOS 345 (H) 11/16/2016   AST 142 (H) 11/16/2016   ALT 173 (H) 11/16/2016  .   Total Time in preparing paper work, data evaluation and todays exam - 19 minutes  Jani Gravel M.D on 11/16/2016 at 2:53 PM  [facility]   Office  [phone]

## 2016-11-17 LAB — CULTURE, BLOOD (ROUTINE X 2)
CULTURE: NO GROWTH
Culture: NO GROWTH
SPECIAL REQUESTS: ADEQUATE
SPECIAL REQUESTS: ADEQUATE

## 2016-11-18 ENCOUNTER — Other Ambulatory Visit: Payer: Self-pay | Admitting: *Deleted

## 2016-11-18 LAB — CULTURE, BODY FLUID-BOTTLE: CULTURE: NO GROWTH

## 2016-11-18 LAB — CULTURE, BODY FLUID W GRAM STAIN -BOTTLE

## 2016-11-18 MED FILL — HYDROCORTISONE 2.5% CREAM: 2.5 | 30 days supply | Qty: 30 | Fill #0

## 2016-11-18 MED FILL — POLYETHYLENE GLYCOL 3350: 17 days supply | Qty: 255 | Fill #0

## 2016-11-27 ENCOUNTER — Encounter (HOSPITAL_COMMUNITY)
Admission: AD | Disposition: A | Discharge status: Home / Self Care | Payer: Self-pay | Source: Ambulatory Visit | Attending: Vascular Surgery

## 2016-11-27 ENCOUNTER — Encounter (HOSPITAL_COMMUNITY): Payer: Self-pay | Admitting: Vascular Surgery

## 2016-11-27 ENCOUNTER — Ambulatory Visit (HOSPITAL_COMMUNITY)
Admission: AD | Admit: 2016-11-27 | Discharge: 2016-11-27 | Disposition: A | Discharge status: Home / Self Care | Payer: Medicaid Other | Source: Ambulatory Visit | Attending: Vascular Surgery | Admitting: Vascular Surgery

## 2016-11-27 DIAGNOSIS — Z794 Long term (current) use of insulin: Secondary | ICD-10-CM | POA: Diagnosis not present

## 2016-11-27 DIAGNOSIS — Z8673 Personal history of transient ischemic attack (TIA), and cerebral infarction without residual deficits: Secondary | ICD-10-CM | POA: Insufficient documentation

## 2016-11-27 DIAGNOSIS — T82858A Stenosis of vascular prosthetic devices, implants and grafts, initial encounter: Secondary | ICD-10-CM | POA: Insufficient documentation

## 2016-11-27 DIAGNOSIS — Y832 Surgical operation with anastomosis, bypass or graft as the cause of abnormal reaction of the patient, or of later complication, without mention of misadventure at the time of the procedure: Secondary | ICD-10-CM | POA: Insufficient documentation

## 2016-11-27 DIAGNOSIS — N186 End stage renal disease: Secondary | ICD-10-CM | POA: Insufficient documentation

## 2016-11-27 DIAGNOSIS — H409 Unspecified glaucoma: Secondary | ICD-10-CM | POA: Insufficient documentation

## 2016-11-27 DIAGNOSIS — Z992 Dependence on renal dialysis: Secondary | ICD-10-CM | POA: Insufficient documentation

## 2016-11-27 DIAGNOSIS — Z7982 Long term (current) use of aspirin: Secondary | ICD-10-CM | POA: Diagnosis not present

## 2016-11-27 DIAGNOSIS — Z87891 Personal history of nicotine dependence: Secondary | ICD-10-CM | POA: Insufficient documentation

## 2016-11-27 DIAGNOSIS — I2510 Atherosclerotic heart disease of native coronary artery without angina pectoris: Secondary | ICD-10-CM | POA: Diagnosis not present

## 2016-11-27 DIAGNOSIS — I120 Hypertensive chronic kidney disease with stage 5 chronic kidney disease or end stage renal disease: Secondary | ICD-10-CM | POA: Insufficient documentation

## 2016-11-27 DIAGNOSIS — E1122 Type 2 diabetes mellitus with diabetic chronic kidney disease: Secondary | ICD-10-CM | POA: Diagnosis not present

## 2016-11-27 DIAGNOSIS — T82898A Other specified complication of vascular prosthetic devices, implants and grafts, initial encounter: Secondary | ICD-10-CM

## 2016-11-27 HISTORY — PX: A/V FISTULAGRAM: CATH118298

## 2016-11-27 HISTORY — PX: PERIPHERAL VASCULAR BALLOON ANGIOPLASTY: CATH118281

## 2016-11-27 LAB — POCT I-STAT, CHEM 8
BUN: 18 mg/dL (ref 6–20)
CHLORIDE: 96 mmol/L — AB (ref 101–111)
CREATININE: 2.9 mg/dL — AB (ref 0.61–1.24)
Calcium, Ion: 1.01 mmol/L — ABNORMAL LOW (ref 1.15–1.40)
GLUCOSE: 194 mg/dL — AB (ref 65–99)
HCT: 32 % — ABNORMAL LOW (ref 39.0–52.0)
HEMOGLOBIN: 10.9 g/dL — AB (ref 13.0–17.0)
POTASSIUM: 3.7 mmol/L (ref 3.5–5.1)
Sodium: 138 mmol/L (ref 135–145)
TCO2: 32 mmol/L (ref 0–100)

## 2016-11-27 SURGERY — A/V FISTULAGRAM
Anesthesia: LOCAL | Laterality: Left

## 2016-11-27 MED ORDER — SODIUM CHLORIDE 0.9% FLUSH: 3.0000 mL | INTRAVENOUS | Status: DC | PRN

## 2016-11-27 MED ORDER — LIDOCAINE HCL (PF) 1 % IJ SOLN
INTRAMUSCULAR | Status: DC | PRN
  Administered 2016-11-27: 2 mL

## 2016-11-27 MED ORDER — LABETALOL HCL 5 MG/ML IV SOLN
20.0000 mg | INTRAVENOUS | Status: AC
  Administered 2016-11-27: 20 mg via INTRAVENOUS

## 2016-11-27 MED ORDER — SODIUM CHLORIDE 0.9% FLUSH: 3.0000 mL | Freq: Two times a day (BID) | INTRAVENOUS | Status: DC

## 2016-11-27 MED ORDER — IODIXANOL 320 MG/ML IV SOLN
INTRAVENOUS | Status: DC | PRN
  Administered 2016-11-27: 35 mL via INTRAVENOUS

## 2016-11-27 MED ORDER — SODIUM CHLORIDE 0.9 % IV SOLN: 250.0000 mL | INTRAVENOUS | Status: DC | PRN

## 2016-11-27 MED ORDER — LABETALOL HCL 5 MG/ML IV SOLN
INTRAVENOUS | Status: AC
  Filled 2016-11-27: qty 4

## 2016-11-27 MED ORDER — HEPARIN (PORCINE) IN NACL 2-0.9 UNIT/ML-% IJ SOLN
INTRAMUSCULAR | Status: AC | PRN
  Administered 2016-11-27: 500 mL

## 2016-11-27 SURGICAL SUPPLY — 19 items
BAG SNAP BAND KOVER 36X36 (MISCELLANEOUS) ×2 IMPLANT
BALLN IN.PACT DCB 7X80 (BALLOONS) ×2
BALLN MUSTANG 8X80X75 (BALLOONS) ×2
BALLOON MUSTANG 8X80X75 (BALLOONS) ×1 IMPLANT
COVER DOME SNAP 22 D (MISCELLANEOUS) ×2 IMPLANT
COVER PRB 48X5XTLSCP FOLD TPE (BAG) ×1 IMPLANT
COVER PROBE 5X48 (BAG) ×1
DCB IN.PACT 7X80 (BALLOONS) ×1 IMPLANT
KIT ENCORE 26 ADVANTAGE (KITS) ×2 IMPLANT
KIT MICROINTRODUCER STIFF 5F (SHEATH) ×2 IMPLANT
PROTECTION STATION PRESSURIZED (MISCELLANEOUS) ×2
SHEATH PINNACLE R/O II 6F 4CM (SHEATH) ×2 IMPLANT
SHEATH PINNACLE R/O II 7F 4CM (SHEATH) ×2 IMPLANT
STATION PROTECTION PRESSURIZED (MISCELLANEOUS) ×1 IMPLANT
STOPCOCK MORSE 400PSI 3WAY (MISCELLANEOUS) ×2 IMPLANT
TRAY PV CATH (CUSTOM PROCEDURE TRAY) ×2 IMPLANT
TUBING CIL FLEX 10 FLL-RA (TUBING) ×2 IMPLANT
WIRE BENTSON .035X145CM (WIRE) ×2 IMPLANT
WIRE HI TORQ VERSACORE J 260CM (WIRE) ×2 IMPLANT

## 2016-11-27 NOTE — Discharge Instructions (Signed)

## 2016-11-27 NOTE — Progress Notes (Addendum)
Dr Donzetta Matters notified no order for length of bedrest and pt BP 201/83.  Bedrest for one hour, labetalol 20 mg IV now.  Will continue to monitor.

## 2016-11-27 NOTE — Op Note (Signed)
    Patient name: Russell Price MRN: [id number] DOB: [birth year] Sex: male  11/27/2016 Pre-operative Diagnosis: esrd, malfunction left arm avf Post-operative diagnosis:  Same Surgeon:  Eda Paschal. Donzetta Matters, MD Procedure Performed: 1.  US guided cannulation left arm av fistula 2.  Drug coated balloon angioplasty of left arm basilic vein av fistula with 7 x 80 impact  Indications:  56 year old male with history of end-stage renal disease on dialysis now via a tunnel catheter on the right after having an infiltration of an of his left arm AV fistula. He is now indicated for fistulogram with possible intervention.   Findings: The fistula in the proximal upper arm was stenotic >50% and long segment and the fistula itself was pulsatile. After intervention there is brisk flow through the stenotic area with less than 20% residual and there is strong thrill that is palpable. Centrally there is no stenosis in the veins and the arteriovenous anastomosis is also patent without stenosis.   Procedure:  The patient was identified in the holding area and taken to room 8 was placed supine on operating table. Patient had recently eaten so no anesthesia was given. Timeout was called. Ultrasound was used to identify the fistula and 1% lidocaine instilled. We then cannulated the fistula with micropuncture needle and placed a micropuncture sheath and performed upper extremity fistulogram. With findings above we decided to intervene. We then exchanged for a 6 French sheath performed balloon edge plasty with an 8 mm balloon of the entire stenotic area of the fistula. We then exchanged for long wire through the balloon placed a 7 French sheath and performed drug-coated balloon antroplasty was 7 mm balloon inflated to 14 atm for 2 minutes time. Completion demonstrated significant improvement in the stenosis now with residual less than 20% and there was a palpable thrill in the fistula. The wire was removed and the access site  closed with 4-0 Monocryl figure-of-eight suture. Patient tolerated procedure well without immediate complication.   Contrast: 35cc     Zamia Tyminski C. Donzetta Matters, MD [facility] Office: [phone] Pager: [phone]

## 2016-11-27 NOTE — H&P (Signed)
HP   History of Present Illness: This is a 56 y.o. male with esrd on hd now via rij catheter. Has left 2nd stage bvt with recent infiltration.  Past Medical History:  Diagnosis Date  . CAD (coronary artery disease)    a. underwent cath in 02/2016 after an abnormal nuc which showed 2V CAD in RCA and OM3 with no good targets for PCI. Medical managment recommended.   . Chronic kidney disease   . Diabetes mellitus without complication ([place])    followed by Dr Chalmers Cater  . ESRD (end stage renal disease) ([place])   . Glaucoma   . Hypertension    followed by [facility]  . Stroke [facility])     Past Surgical History:  Procedure Laterality Date  . AV FISTULA PLACEMENT Left 03/12/2016   Procedure: Left Arm ARTERIOVENOUS (AV) FISTULA CREATION;  Surgeon: Waynetta Sandy, MD;  Location: [place];  Service: Vascular;  Laterality: Left;  . BASCILIC VEIN TRANSPOSITION Left 05/14/2016   Procedure: SECOND STAGE LEFT BASILIC VEIN TRANSPOSITION;  Surgeon: Waynetta Sandy, MD;  Location: [place];  Service: Vascular;  Laterality: Left;  . BRONCHOSCOPY  02/14/2015   for pulm hemorrhage  . CARDIAC CATHETERIZATION N/A 03/04/2016   Procedure: Left Heart Cath and Coronary Angiography;  Surgeon: Leonie Man, MD;  Location: [place] CV [facility];  Service: Cardiovascular;  Laterality: N/A;  . EYE SURGERY Left   . INSERTION OF DIALYSIS CATHETER Right 11/08/2016   Procedure: INSERTION OF DIALYSIS CATHETER;  Surgeon: Rosetta Posner, MD;  [facility];  Service: Vascular;  Laterality: Right;  . IR PARACENTESIS  11/13/2016    Allergies  Allergen Reactions  . No Known Allergies     Prior to Admission medications   Medication Sig Start Date End Date Taking? Authorizing Provider  aspirin 81 MG chewable tablet Chew 1 tablet (81 mg total) by mouth daily. Patient taking differently: Chew 81 mg by mouth every evening.  09/14/15  Yes Angiulli, Lavon Paganini, PA-C  atorvastatin (LIPITOR) 40 MG tablet  Take 1 tablet (40 mg total) by mouth daily at 6 PM. 09/02/16  Yes Amao, Charlane Ferretti, MD  Blood Glucose Monitoring Suppl (ACCU-CHEK AVIVA) device Use as instructed 3 times daily before meals and at bedtime. 12/18/15  Yes Arnoldo Morale, MD  calcitRIOL (ROCALTROL) 0.25 MCG capsule Take 0.25 mcg by mouth daily. 11/04/16  Yes [provider]  carvedilol (COREG) 12.5 MG tablet Take 12.5 mg by mouth 2 (two) times daily with a meal.   Yes [provider]  furosemide (LASIX) 80 MG tablet Take 80 mg by mouth 2 (two) times daily.   Yes [provider]  gabapentin (NEURONTIN) 300 MG capsule Take 1 capsule (300 mg total) by mouth daily. 09/02/16  Yes Arnoldo Morale, MD  glucose blood (ACCU-CHEK AVIVA PLUS) test strip Use 3 times daily before meals and at bedtime 12/18/15  Yes Amao, Charlane Ferretti, MD  hydrocortisone (ANUSOL-HC) 2.5 % rectal cream Place rectally 2 (two) times daily. 11/16/16  Yes Jani Gravel, MD  insulin aspart (NOVOLOG) 100 UNIT/ML injection Inject 2-10 Units into the skin 3 (three) times daily before meals. Patient taking differently: Inject 2-6 Units into the skin 3 (three) times daily before meals. Per sliding scale 09/02/16  Yes Amao, Charlane Ferretti, MD  insulin glargine (LANTUS) 100 UNIT/ML injection Inject 0.15 mLs (15 Units total) into the skin at bedtime. 09/02/16  Yes Arnoldo Morale, MD  Lancet Devices [facility]) lancets Use as instructed 3 times daily  before meals and at bedtime 12/18/15  Yes Amao, Charlane Ferretti, MD  multivitamin (RENA-VIT) TABS tablet Take 1 tablet by mouth daily.   Yes [provider]  Nutritional Supplements (FEEDING SUPPLEMENT, NEPRO CARB STEADY,) LIQD Take 237 mLs by mouth 2 (two) times daily between meals. 11/16/16  Yes Jani Gravel, MD  polyethylene glycol [facility] Floria Raveling) packet Take 17 g by mouth daily. 11/16/16  Yes Jani Gravel, MD  Darbepoetin Alfa (ARANESP) 150 MCG/0.3ML SOSY injection Inject 0.3 mLs (150 mcg total) into the vein every Saturday with  hemodialysis. Patient not taking: Reported on 11/22/2016 11/16/16   Jani Gravel, MD    Social History   Social History  . Marital status: Single    Spouse name: N/A  . Number of children: N/A  . Years of education: N/A   Occupational History  . Not on file.   Social History Main Topics  . Smoking status: Former Smoker    Packs/day: 0.00    Years: 4.00    Quit date: 02/23/1983  . Smokeless tobacco: Never Used  . Alcohol use No  . Drug use: No  . Sexual activity: Not on file   Other Topics Concern  . Not on file   Social History Narrative  . No narrative on file     Family History  Problem Relation Age of Onset  . Diabetes Mother     ROS: no complaints today  Physical Examination  Vitals:   11/27/16 0830  BP: (!) 174/77  Pulse: 66  Resp: 20  Temp: 97.7 F (36.5 C)   Body mass index is 19.99 kg/m.  General:  WDWN in NAD Gait: Not observed HENT: WNL, normocephalic Pulmonary: normal non-labored breathing Cardiac: rrr Abdomen:  soft, NT/ND, no masses Extremities: left arm avf with palpable pulatility Psychiatric:  Appropriate mood and affect  CBC    Component Value Date/Time   WBC 6.0 11/16/2016 0925   RBC 2.62 (L) 11/16/2016 0925   HGB 10.9 (L) 11/27/2016 0854   HCT 32.0 (L) 11/27/2016 0854   PLT 132 (L) 11/16/2016 0925   MCV 86.3 11/16/2016 0925   MCV 85.0 02/27/2015 1458   MCH 28.2 11/16/2016 0925   MCHC 32.7 11/16/2016 0925   RDW 14.5 11/16/2016 0925   LYMPHSABS 0.7 11/12/2016 0756   MONOABS 0.7 11/12/2016 0756   EOSABS 0.5 11/12/2016 0756   BASOSABS 0.0 11/12/2016 0756    BMET    Component Value Date/Time   NA 138 11/27/2016 0854   NA 132 (L) 09/02/2016 1158   K 3.7 11/27/2016 0854   CL 96 (L) 11/27/2016 0854   CO2 27 11/16/2016 0925   GLUCOSE 194 (H) 11/27/2016 0854   BUN 18 11/27/2016 0854   BUN 111 (HH) 09/02/2016 1158   CREATININE 2.90 (H) 11/27/2016 0854   CREATININE 4.38 (H) 03/01/2016 0856   CALCIUM 8.1 (L) 11/16/2016 0925     GFRNONAA 11 (L) 11/16/2016 0925   GFRNONAA 16 (L) 09/21/2015 1129   GFRAA 13 (L) 11/16/2016 0925   GFRAA 19 (L) 09/21/2015 1129    COAGS: Lab Results  Component Value Date   INR 1.0 03/01/2016   INR 1.29 09/03/2015   INR 1.18 02/22/2015     ASSESSMENT/PLAN: This is a 56 y.o. male with esrd. Recent infiltration event. Here for fistulogram. Discussed risks and benefits and he agrees to proceed.   Jazsmine Macari C. Donzetta Matters, MD [facility] Office: [phone] Pager: [phone]

## 2016-12-02 ENCOUNTER — Ambulatory Visit: Payer: Medicaid Other | Admitting: Family Medicine

## 2016-12-04 MED FILL — ATORVASTATIN 20 MG TABLET: 20 | 30 days supply | Qty: 60 | Fill #0

## 2016-12-04 MED FILL — CALCITRIOL 0.25 MCG CAPSULE: 0.25 | 30 days supply | Qty: 30 | Fill #0

## 2016-12-09 ENCOUNTER — Encounter: Payer: Self-pay | Admitting: Family Medicine

## 2016-12-09 ENCOUNTER — Ambulatory Visit: Payer: Medicaid Other | Attending: Family Medicine | Admitting: Family Medicine

## 2016-12-09 VITALS — BP 140/72 | HR 62 | Temp 98.5°F | Resp 18 | Ht 71.0 in | Wt 147.8 lb

## 2016-12-09 DIAGNOSIS — Z1211 Encounter for screening for malignant neoplasm of colon: Secondary | ICD-10-CM | POA: Diagnosis not present

## 2016-12-09 DIAGNOSIS — E1141 Type 2 diabetes mellitus with diabetic mononeuropathy: Secondary | ICD-10-CM | POA: Diagnosis not present

## 2016-12-09 DIAGNOSIS — Z992 Dependence on renal dialysis: Secondary | ICD-10-CM | POA: Diagnosis not present

## 2016-12-09 DIAGNOSIS — Z9889 Other specified postprocedural states: Secondary | ICD-10-CM | POA: Insufficient documentation

## 2016-12-09 DIAGNOSIS — Z955 Presence of coronary angioplasty implant and graft: Secondary | ICD-10-CM | POA: Diagnosis not present

## 2016-12-09 DIAGNOSIS — I10 Essential (primary) hypertension: Secondary | ICD-10-CM | POA: Diagnosis not present

## 2016-12-09 DIAGNOSIS — H5461 Unqualified visual loss, right eye, normal vision left eye: Secondary | ICD-10-CM | POA: Insufficient documentation

## 2016-12-09 DIAGNOSIS — I120 Hypertensive chronic kidney disease with stage 5 chronic kidney disease or end stage renal disease: Secondary | ICD-10-CM | POA: Insufficient documentation

## 2016-12-09 DIAGNOSIS — E0841 Diabetes mellitus due to underlying condition with diabetic mononeuropathy: Secondary | ICD-10-CM | POA: Diagnosis not present

## 2016-12-09 DIAGNOSIS — H409 Unspecified glaucoma: Secondary | ICD-10-CM | POA: Diagnosis not present

## 2016-12-09 DIAGNOSIS — Z7982 Long term (current) use of aspirin: Secondary | ICD-10-CM | POA: Diagnosis not present

## 2016-12-09 DIAGNOSIS — Z794 Long term (current) use of insulin: Secondary | ICD-10-CM | POA: Diagnosis not present

## 2016-12-09 DIAGNOSIS — I2510 Atherosclerotic heart disease of native coronary artery without angina pectoris: Secondary | ICD-10-CM | POA: Diagnosis not present

## 2016-12-09 DIAGNOSIS — E118 Type 2 diabetes mellitus with unspecified complications: Secondary | ICD-10-CM

## 2016-12-09 DIAGNOSIS — R634 Abnormal weight loss: Secondary | ICD-10-CM | POA: Diagnosis not present

## 2016-12-09 DIAGNOSIS — E1122 Type 2 diabetes mellitus with diabetic chronic kidney disease: Secondary | ICD-10-CM | POA: Insufficient documentation

## 2016-12-09 DIAGNOSIS — E7800 Pure hypercholesterolemia, unspecified: Secondary | ICD-10-CM | POA: Insufficient documentation

## 2016-12-09 DIAGNOSIS — Z8673 Personal history of transient ischemic attack (TIA), and cerebral infarction without residual deficits: Secondary | ICD-10-CM | POA: Diagnosis not present

## 2016-12-09 DIAGNOSIS — N186 End stage renal disease: Secondary | ICD-10-CM | POA: Insufficient documentation

## 2016-12-09 MED ORDER — MEGESTROL ACETATE 40 MG PO TABS
40.0000 mg | ORAL_TABLET | Freq: Every day | ORAL | 0 refills | Status: DC
Start: 2016-12-09 — End: 2017-03-14

## 2016-12-09 MED FILL — ACCU-CHEK AVIVA PLUS TEST S: 25 days supply | Qty: 100 | Fill #10

## 2016-12-09 MED FILL — MEGESTROL 40 MG TABLET: 40 | 30 days supply | Qty: 30 | Fill #0

## 2016-12-09 NOTE — Progress Notes (Signed)
Subjective:    Patient ID: Russell Price, male    DOB: January 19, 1961, 56 y.o.   MRN: [id number]  HPI He is a 56 year old male with a history of hypertension, type 2 diabetes mellitus (A1c 6.9), diabetic neuropathy, blind in the right eye, end-stage renal disease (currently on hemodialysis on Tuesdays, Thursdays and Saturdays) who comes in today for a follow-up visit.  He is accompanied by his wife and endorses compliance with his medications. Blood sugar log reveals fluctuations in blood sugars with fasting between 98 and 200 and he has remained on 15 units of Lantus. He denies tremors, hyperglycemic symptoms. On some occasions when his evening blood sugars are in the low 100 he skips his Lantus. He is also on NovoLog sliding scale.  They are both concerned about his weight loss as he has lost 46 pounds in the last 3 months and request a multivitamin to improve his appetite. He informs me he had some early satiety but denies constipation or diarrhea. He denies being depressed and occasionally takes walks. His social support include his spouse and his brother.   Past Medical History:  Diagnosis Date  . CAD (coronary artery disease)    a. underwent cath in 02/2016 after an abnormal nuc which showed 2V CAD in RCA and OM3 with no good targets for PCI. Medical managment recommended.   . Chronic kidney disease   . Diabetes mellitus without complication ([place])    followed by Dr Chalmers Cater  . ESRD (end stage renal disease) ([place])   . Glaucoma   . Hypertension    followed by [facility]  . Stroke [facility])     Past Surgical History:  Procedure Laterality Date  . A/V SHUNTOGRAM Left 11/27/2016   Procedure: A/V Fistulagram;  Surgeon: Waynetta Sandy, MD;  Location: [place] [facility];  Service: Cardiovascular;  Laterality: Left;  . AV FISTULA PLACEMENT Left 03/12/2016   Procedure: Left Arm ARTERIOVENOUS (AV) FISTULA CREATION;  Surgeon: Waynetta Sandy, MD;  Location: [place];  Service: Vascular;  Laterality: Left;  . BASCILIC VEIN TRANSPOSITION Left 05/14/2016   Procedure: SECOND STAGE LEFT BASILIC VEIN TRANSPOSITION;  Surgeon: Waynetta Sandy, MD;  Location: [place];  Service: Vascular;  Laterality: Left;  . BRONCHOSCOPY  02/14/2015   for pulm hemorrhage  . CARDIAC CATHETERIZATION N/A 03/04/2016   Procedure: Left Heart Cath and Coronary Angiography;  Surgeon: Leonie Man, MD;  Location: [place] [facility];  Service: Cardiovascular;  Laterality: N/A;  . EYE SURGERY Left   . INSERTION OF DIALYSIS CATHETER Right 11/08/2016   Procedure: INSERTION OF DIALYSIS CATHETER;  Surgeon: Rosetta Posner, MD;  Location: [place];  Service: Vascular;  Laterality: Right;  . IR PARACENTESIS  11/13/2016  . PERIPHERAL VASCULAR BALLOON ANGIOPLASTY Left 11/27/2016   Procedure: Peripheral Vascular Balloon Angioplasty;  Surgeon: Waynetta Sandy, MD;  Location: [place] [facility];  Service: Cardiovascular;  Laterality: Left;  ARM FISTULA    Allergies  Allergen Reactions  . No Known Allergies     Current Outpatient Prescriptions on File Prior to Visit  Medication Sig Dispense Refill  . aspirin 81 MG chewable tablet Chew 1 tablet (81 mg total) by mouth daily. (Patient taking differently: Chew 81 mg by mouth every evening. )    . atorvastatin (LIPITOR) 40 MG tablet Take 1 tablet (40 mg total) by mouth daily at 6 PM. 30 tablet 5  . Blood Glucose Monitoring Suppl (ACCU-CHEK AVIVA) device Use as instructed  3 times daily before meals and at bedtime. 1 each 0  . calcitRIOL (ROCALTROL) 0.25 MCG capsule Take 0.25 mcg by mouth daily.  6  . carvedilol (COREG) 12.5 MG tablet Take 12.5 mg by mouth 2 (two) times daily with a meal.    . Darbepoetin Alfa (ARANESP) 150 MCG/0.3ML SOSY injection Inject 0.3 mLs (150 mcg total) into the vein every Saturday with hemodialysis. 1.68 mL 0  . furosemide (LASIX) 80 MG tablet Take 80 mg by mouth 2 (two) times daily.    Marland Kitchen gabapentin (NEURONTIN)  300 MG capsule Take 1 capsule (300 mg total) by mouth daily. 30 capsule 5  . glucose blood (ACCU-CHEK AVIVA PLUS) test strip Use 3 times daily before meals and at bedtime 100 each 12  . hydrocortisone (ANUSOL-HC) 2.5 % rectal cream Place rectally 2 (two) times daily. 30 g 0  . insulin aspart (NOVOLOG) 100 UNIT/ML injection Inject 2-10 Units into the skin 3 (three) times daily before meals. (Patient taking differently: Inject 2-6 Units into the skin 3 (three) times daily before meals. Per sliding scale) 10 mL 5  . insulin glargine (LANTUS) 100 UNIT/ML injection Inject 0.15 mLs (15 Units total) into the skin at bedtime. 10 mL 5  . Lancet Devices (ACCU-CHEK SOFTCLIX) lancets Use as instructed 3 times daily before meals and at bedtime 1 each 12  . multivitamin (RENA-VIT) TABS tablet Take 1 tablet by mouth daily.    . Nutritional Supplements (FEEDING SUPPLEMENT, NEPRO CARB STEADY,) LIQD Take 237 mLs by mouth 2 (two) times daily between meals. 60 Can 0  . polyethylene glycol (MIRALAX / GLYCOLAX) packet Take 17 g by mouth daily. 14 each 0   No current facility-administered medications on file prior to visit.      Review of Systems Constitutional: Negative for activity change and positive for weight loss and appetite change.  HENT: Negative for sinus pressure and sore throat.   Eyes: Positive for visual disturbance.  Respiratory: Negative for cough, chest tightness and shortness of breath.   Cardiovascular: Negative for chest pain and leg swelling.  Gastrointestinal: Negative for abdominal distention, abdominal pain, constipation and diarrhea.  Endocrine: Negative.   Genitourinary: Negative for dysuria.  Musculoskeletal: Negative for joint swelling and myalgias.  Skin: Negative for rash.  Allergic/Immunologic: Negative.   Neurological: Negative for weakness, light-headedness and numbness.  Psychiatric/Behavioral: Negative for dysphoric mood and suicidal ideas.     Objective: Vitals:   12/09/16  1529 12/09/16 1543 12/09/16 1552  BP: (!) 167/71 (!) 175/82 140/72  Pulse: 62    Resp: 18    Temp: 98.5 F (36.9 C)    TempSrc: Oral    SpO2: 100%    Weight: 147 lb 12.8 oz (67 kg)    Height: 5\' 11"  (1.803 m)        Physical Exam Constitutional: He is oriented to person, place, and time. He appears well-developed and well-nourished.  Eyes:  Blind in right eye  Cardiovascular: Normal rate, normal heart sounds and intact distal pulses.   No murmur heard. Pulmonary/Chest: Effort normal and breath sounds normal. He has no wheezes. He has no rales. He exhibits no tenderness.  Right upper chest wall subclavian permacath Abdominal: Soft. Bowel sounds are normal. He exhibits no distension and no mass. There is no tenderness.  Musculoskeletal: Normal range of motion. He exhibits no edema  Left brachiocephalic AV fistula with palpable thrill - non functional  Neurological: He is alert and oriented to person, place, and time.  Skin:  Skin is warm and dry.  Psychiatric: He has a normal mood and affect.        Assessment & Plan:  1. DM (diabetes mellitus) with complications ([place]) Controlled with A1c 6.9 Discussed symptoms of hypoglycemia No regimen change - Glucose (CBG) - HgB A1c - insulin glargine (LANTUS) 100 UNIT/ML injection; Inject 0.15 mLs (15 Units total) into the skin at bedtime.  Dispense: 10 mL; Refill: 5 - insulin aspart (NOVOLOG) 100 UNIT/ML injection; Inject 2-10 Units into the skin 3 (three) times daily before meals.  Dispense: 10 mL; Refill: 5   2. Pure hypercholesterolemia Low cholesterol diet - atorvastatin (LIPITOR) 40 MG tablet; Take 1 tablet (40 mg total) by mouth daily at 6 PM.  Dispense: 30 tablet; Refill: 5  3. Essential hypertension Elevated BP above target of less than 130/80 No regimen change as he is prone to hypotension during dialysis Medication regimen as per nephrology - carvedilol (COREG) 6.25 MG tablet; Take 1 tablet (6.25 mg total) by mouth 2 (two)  times daily with a meal.  Dispense: 60 tablet; Refill: 6   4. Diabetic mononeuropathy associated with diabetes mellitus due to underlying condition (HCC) Stable - gabapentin (NEURONTIN) 300 MG capsule; Take 1 capsule (300 mg total) by mouth daily.  Dispense: 30 capsule; Refill: 5  5.Weight loss He has lost 46 pounds in the last 3 months -some of which could be post diuresis Placed on short course of Megace  6. End-stage renal disease on hemodialysis Continue hemodialysis as per schedule-Tuesdays, Thursdays and Saturdays  7. HCM Refer to GI for colonoscopy  This note has been created with Surveyor, quantity. Any transcriptional errors are unintentional.

## 2016-12-09 NOTE — Progress Notes (Signed)
Patient has eaten  Patient has had medications  Patient cbg 244 Patient A1C 6.9

## 2016-12-20 ENCOUNTER — Other Ambulatory Visit: Payer: Self-pay | Admitting: Family Medicine

## 2016-12-20 MED FILL — RENA-VITE TABLET: 30 days supply | Qty: 30 | Fill #5

## 2016-12-20 MED FILL — ?FUROSEMIDE 80MG TABLET: 80 | 30 days supply | Qty: 60 | Fill #4

## 2016-12-20 MED FILL — GABAPENTIN 300 MG CAPSULE: 300 | 30 days supply | Qty: 30 | Fill #3

## 2016-12-23 ENCOUNTER — Other Ambulatory Visit: Payer: Self-pay | Admitting: Gastroenterology

## 2016-12-24 MED FILL — NULYTELY WITH FLAVOR PACKS: 420 | 1 days supply | Qty: 4000 | Fill #0

## 2016-12-25 ENCOUNTER — Other Ambulatory Visit: Payer: Self-pay | Admitting: Family Medicine

## 2016-12-27 ENCOUNTER — Other Ambulatory Visit: Payer: Self-pay | Admitting: Family Medicine

## 2017-01-02 MED FILL — CALCITRIOL 0.25 MCG CAPSULE: 0.25 | 30 days supply | Qty: 30 | Fill #1

## 2017-01-06 ENCOUNTER — Other Ambulatory Visit: Payer: Self-pay | Admitting: Family Medicine

## 2017-01-06 MED FILL — ACCU-CHEK AVIVA PLUS TEST S: 33 days supply | Qty: 100 | Fill #0

## 2017-01-08 MED FILL — LIDOCAINE-PRILOCAINE CREAM: 2.5-2.5 | 15 days supply | Qty: 30 | Fill #0

## 2017-01-09 NOTE — Progress Notes (Signed)
01/09/2017.  Attempted to contact patient.  No voice mail

## 2017-01-10 NOTE — Progress Notes (Signed)
01/09/17- voice mail has not yet been set up.  01/10/17- voice mail has not yet been set up.

## 2017-01-13 ENCOUNTER — Encounter (HOSPITAL_COMMUNITY): Payer: Self-pay

## 2017-01-13 MED FILL — ?FUROSEMIDE 80MG TABLET: 80 | 30 days supply | Qty: 60 | Fill #5

## 2017-01-17 MED FILL — GABAPENTIN 300 MG CAPSULE: 300 | 30 days supply | Qty: 30 | Fill #4

## 2017-01-18 DIAGNOSIS — Z23 Encounter for immunization: Secondary | ICD-10-CM | POA: Diagnosis not present

## 2017-01-18 DIAGNOSIS — R509 Fever, unspecified: Secondary | ICD-10-CM | POA: Diagnosis not present

## 2017-01-18 DIAGNOSIS — A499 Bacterial infection, unspecified: Secondary | ICD-10-CM | POA: Diagnosis not present

## 2017-01-18 DIAGNOSIS — N2581 Secondary hyperparathyroidism of renal origin: Secondary | ICD-10-CM | POA: Diagnosis not present

## 2017-01-18 DIAGNOSIS — N186 End stage renal disease: Secondary | ICD-10-CM | POA: Diagnosis not present

## 2017-01-18 DIAGNOSIS — D649 Anemia, unspecified: Secondary | ICD-10-CM | POA: Diagnosis not present

## 2017-01-21 ENCOUNTER — Other Ambulatory Visit: Payer: Self-pay | Admitting: Family Medicine

## 2017-01-21 DIAGNOSIS — D649 Anemia, unspecified: Secondary | ICD-10-CM | POA: Diagnosis not present

## 2017-01-21 DIAGNOSIS — N2581 Secondary hyperparathyroidism of renal origin: Secondary | ICD-10-CM | POA: Diagnosis not present

## 2017-01-21 DIAGNOSIS — N186 End stage renal disease: Secondary | ICD-10-CM | POA: Diagnosis not present

## 2017-01-21 DIAGNOSIS — A499 Bacterial infection, unspecified: Secondary | ICD-10-CM | POA: Diagnosis not present

## 2017-01-21 DIAGNOSIS — Z23 Encounter for immunization: Secondary | ICD-10-CM | POA: Diagnosis not present

## 2017-01-21 DIAGNOSIS — R509 Fever, unspecified: Secondary | ICD-10-CM | POA: Diagnosis not present

## 2017-01-23 ENCOUNTER — Other Ambulatory Visit: Payer: Self-pay | Admitting: Gastroenterology

## 2017-01-23 ENCOUNTER — Encounter (HOSPITAL_COMMUNITY): Payer: Self-pay | Admitting: Anesthesiology

## 2017-01-23 ENCOUNTER — Ambulatory Visit (HOSPITAL_COMMUNITY)
Admission: RE | Admit: 2017-01-23 | Discharge: 2017-01-23 | Disposition: A | Discharge status: Home / Self Care | Payer: Medicare Other | Source: Ambulatory Visit | Attending: Gastroenterology | Admitting: Gastroenterology

## 2017-01-23 ENCOUNTER — Encounter (HOSPITAL_COMMUNITY)
Admission: RE | Disposition: A | Discharge status: Home / Self Care | Payer: Self-pay | Source: Ambulatory Visit | Attending: Gastroenterology

## 2017-01-23 DIAGNOSIS — Z538 Procedure and treatment not carried out for other reasons: Secondary | ICD-10-CM | POA: Insufficient documentation

## 2017-01-23 DIAGNOSIS — Z1211 Encounter for screening for malignant neoplasm of colon: Secondary | ICD-10-CM | POA: Insufficient documentation

## 2017-01-23 SURGERY — CANCELLED PROCEDURE
Anesthesia: Monitor Anesthesia Care

## 2017-01-23 MED FILL — PEG-3350 SOLUTION: 420 | 1 days supply | Qty: 4000 | Fill #0

## 2017-01-23 MED FILL — ATORVASTATIN 20 MG TABLET: 20 | 30 days supply | Qty: 60 | Fill #1

## 2017-01-23 NOTE — Progress Notes (Signed)
Patient arrived today for colonoscopy. He did not follow instructions, eating a salad with chicken for dinner. Endo RN educated patient on what to eat and when to eat preparing for colonoscopy. Endo RN instructed patient to call [facility] office to reschedule. Patient appeared to understand, shaking his head up and down.

## 2017-01-23 NOTE — Anesthesia Preprocedure Evaluation (Deleted)
Anesthesia Evaluation  Patient identified by MRN, date of birth, ID band Patient awake    Reviewed: Allergy & Precautions, NPO status , Patient's Chart, lab work & pertinent test results  Airway Mallampati: III  TM Distance: >3 FB Neck ROM: Full    Dental  (+) Dental Advisory Given   Pulmonary former smoker,    Pulmonary exam normal breath sounds clear to auscultation       Cardiovascular hypertension, Pt. on medications + CAD and + Peripheral Vascular Disease  Normal cardiovascular exam Rhythm:Regular Rate:Normal  LHC 02/2016 - Mid RCA lesion, 65 %stenosed. Dist RCA lesion, 85 %stenosed. Relatively small caliber vessel. Smaller diameter than 5 French catheter Prox LAD lesion, 45 %stenosed. Mid LAD lesion, 35 %stenosed. 3rd Mrg lesion, 70 %stenosed. LV end diastolic pressure is moderately elevated. MEDICAL MANAGEMENT ONLY   Neuro/Psych CVA, No Residual Symptoms negative psych ROS   GI/Hepatic negative GI ROS, Neg liver ROS,   Endo/Other  diabetes, Insulin Dependent  Renal/GU ESRF and DialysisRenal disease  negative genitourinary   Musculoskeletal negative musculoskeletal ROS (+)   Abdominal   Peds negative pediatric ROS (+)  Hematology negative hematology ROS (+) anemia , Hgb 7.6 Plt 141   Anesthesia Other Findings   Reproductive/Obstetrics negative OB ROS                             Anesthesia Physical  Anesthesia Plan  ASA: IV  Anesthesia Plan: MAC   Post-op Pain Management:    Induction: Intravenous  PONV Risk Score and Plan: Treatment may vary due to age or medical condition and Propofol infusion  Airway Management Planned: Nasal Cannula  Additional Equipment: None  Intra-op Plan:   Post-operative Plan:   Informed Consent: I have reviewed the patients History and Physical, chart, labs and discussed the procedure including the risks, benefits and alternatives for  the proposed anesthesia with the patient or authorized representative who has indicated his/her understanding and acceptance.   Dental advisory given  Plan Discussed with: CRNA  Anesthesia Plan Comments:         Anesthesia Quick Evaluation

## 2017-01-24 DIAGNOSIS — D649 Anemia, unspecified: Secondary | ICD-10-CM | POA: Diagnosis not present

## 2017-01-24 DIAGNOSIS — Z23 Encounter for immunization: Secondary | ICD-10-CM | POA: Diagnosis not present

## 2017-01-24 DIAGNOSIS — R509 Fever, unspecified: Secondary | ICD-10-CM | POA: Diagnosis not present

## 2017-01-24 DIAGNOSIS — N2581 Secondary hyperparathyroidism of renal origin: Secondary | ICD-10-CM | POA: Diagnosis not present

## 2017-01-24 DIAGNOSIS — A499 Bacterial infection, unspecified: Secondary | ICD-10-CM | POA: Diagnosis not present

## 2017-01-24 DIAGNOSIS — N186 End stage renal disease: Secondary | ICD-10-CM | POA: Diagnosis not present

## 2017-01-25 DIAGNOSIS — N186 End stage renal disease: Secondary | ICD-10-CM | POA: Diagnosis not present

## 2017-01-25 DIAGNOSIS — Z23 Encounter for immunization: Secondary | ICD-10-CM | POA: Diagnosis not present

## 2017-01-25 DIAGNOSIS — A499 Bacterial infection, unspecified: Secondary | ICD-10-CM | POA: Diagnosis not present

## 2017-01-25 DIAGNOSIS — R509 Fever, unspecified: Secondary | ICD-10-CM | POA: Diagnosis not present

## 2017-01-25 DIAGNOSIS — N2581 Secondary hyperparathyroidism of renal origin: Secondary | ICD-10-CM | POA: Diagnosis not present

## 2017-01-25 DIAGNOSIS — D649 Anemia, unspecified: Secondary | ICD-10-CM | POA: Diagnosis not present

## 2017-01-27 DIAGNOSIS — T82858A Stenosis of vascular prosthetic devices, implants and grafts, initial encounter: Secondary | ICD-10-CM | POA: Diagnosis not present

## 2017-01-27 DIAGNOSIS — N186 End stage renal disease: Secondary | ICD-10-CM | POA: Diagnosis not present

## 2017-01-27 DIAGNOSIS — I871 Compression of vein: Secondary | ICD-10-CM | POA: Diagnosis not present

## 2017-01-27 DIAGNOSIS — Z992 Dependence on renal dialysis: Secondary | ICD-10-CM | POA: Diagnosis not present

## 2017-01-27 MED FILL — CALCITRIOL 0.25 MCG CAPSULE: 0.25 | 30 days supply | Qty: 30 | Fill #2

## 2017-01-28 DIAGNOSIS — Z23 Encounter for immunization: Secondary | ICD-10-CM | POA: Diagnosis not present

## 2017-01-28 DIAGNOSIS — A499 Bacterial infection, unspecified: Secondary | ICD-10-CM | POA: Diagnosis not present

## 2017-01-28 DIAGNOSIS — D649 Anemia, unspecified: Secondary | ICD-10-CM | POA: Diagnosis not present

## 2017-01-28 DIAGNOSIS — N2581 Secondary hyperparathyroidism of renal origin: Secondary | ICD-10-CM | POA: Diagnosis not present

## 2017-01-28 DIAGNOSIS — N186 End stage renal disease: Secondary | ICD-10-CM | POA: Diagnosis not present

## 2017-01-28 DIAGNOSIS — R509 Fever, unspecified: Secondary | ICD-10-CM | POA: Diagnosis not present

## 2017-01-29 DIAGNOSIS — E113512 Type 2 diabetes mellitus with proliferative diabetic retinopathy with macular edema, left eye: Secondary | ICD-10-CM | POA: Diagnosis not present

## 2017-01-30 DIAGNOSIS — N2581 Secondary hyperparathyroidism of renal origin: Secondary | ICD-10-CM | POA: Diagnosis not present

## 2017-01-30 DIAGNOSIS — Z23 Encounter for immunization: Secondary | ICD-10-CM | POA: Diagnosis not present

## 2017-01-30 DIAGNOSIS — N186 End stage renal disease: Secondary | ICD-10-CM | POA: Diagnosis not present

## 2017-01-30 DIAGNOSIS — R509 Fever, unspecified: Secondary | ICD-10-CM | POA: Diagnosis not present

## 2017-01-30 DIAGNOSIS — D649 Anemia, unspecified: Secondary | ICD-10-CM | POA: Diagnosis not present

## 2017-01-30 DIAGNOSIS — A499 Bacterial infection, unspecified: Secondary | ICD-10-CM | POA: Diagnosis not present

## 2017-02-01 DIAGNOSIS — Z23 Encounter for immunization: Secondary | ICD-10-CM | POA: Diagnosis not present

## 2017-02-01 DIAGNOSIS — N186 End stage renal disease: Secondary | ICD-10-CM | POA: Diagnosis not present

## 2017-02-01 DIAGNOSIS — A499 Bacterial infection, unspecified: Secondary | ICD-10-CM | POA: Diagnosis not present

## 2017-02-01 DIAGNOSIS — R509 Fever, unspecified: Secondary | ICD-10-CM | POA: Diagnosis not present

## 2017-02-01 DIAGNOSIS — N2581 Secondary hyperparathyroidism of renal origin: Secondary | ICD-10-CM | POA: Diagnosis not present

## 2017-02-01 DIAGNOSIS — D649 Anemia, unspecified: Secondary | ICD-10-CM | POA: Diagnosis not present

## 2017-02-04 DIAGNOSIS — A499 Bacterial infection, unspecified: Secondary | ICD-10-CM | POA: Diagnosis not present

## 2017-02-04 DIAGNOSIS — D649 Anemia, unspecified: Secondary | ICD-10-CM | POA: Diagnosis not present

## 2017-02-04 DIAGNOSIS — N2581 Secondary hyperparathyroidism of renal origin: Secondary | ICD-10-CM | POA: Diagnosis not present

## 2017-02-04 DIAGNOSIS — Z23 Encounter for immunization: Secondary | ICD-10-CM | POA: Diagnosis not present

## 2017-02-04 DIAGNOSIS — R509 Fever, unspecified: Secondary | ICD-10-CM | POA: Diagnosis not present

## 2017-02-04 DIAGNOSIS — N186 End stage renal disease: Secondary | ICD-10-CM | POA: Diagnosis not present

## 2017-02-06 ENCOUNTER — Emergency Department (HOSPITAL_COMMUNITY): Payer: Medicare Other

## 2017-02-06 ENCOUNTER — Encounter (HOSPITAL_COMMUNITY): Payer: Self-pay

## 2017-02-06 ENCOUNTER — Inpatient Hospital Stay (HOSPITAL_COMMUNITY)
Admission: EM | Admit: 2017-02-06 | Discharge: 2017-02-08 | DRG: 314 | Disposition: A | Discharge status: Home / Self Care | Payer: Medicare Other | Attending: Internal Medicine | Admitting: Internal Medicine

## 2017-02-06 ENCOUNTER — Other Ambulatory Visit: Payer: Self-pay

## 2017-02-06 DIAGNOSIS — Z7982 Long term (current) use of aspirin: Secondary | ICD-10-CM | POA: Diagnosis not present

## 2017-02-06 DIAGNOSIS — I10 Essential (primary) hypertension: Secondary | ICD-10-CM

## 2017-02-06 DIAGNOSIS — T82858A Stenosis of vascular prosthetic devices, implants and grafts, initial encounter: Secondary | ICD-10-CM | POA: Diagnosis not present

## 2017-02-06 DIAGNOSIS — Z9911 Dependence on respirator [ventilator] status: Secondary | ICD-10-CM | POA: Diagnosis not present

## 2017-02-06 DIAGNOSIS — D631 Anemia in chronic kidney disease: Secondary | ICD-10-CM | POA: Diagnosis present

## 2017-02-06 DIAGNOSIS — E785 Hyperlipidemia, unspecified: Secondary | ICD-10-CM | POA: Diagnosis present

## 2017-02-06 DIAGNOSIS — T827XXA Infection and inflammatory reaction due to other cardiac and vascular devices, implants and grafts, initial encounter: Principal | ICD-10-CM | POA: Diagnosis present

## 2017-02-06 DIAGNOSIS — Z452 Encounter for adjustment and management of vascular access device: Secondary | ICD-10-CM | POA: Diagnosis not present

## 2017-02-06 DIAGNOSIS — E8889 Other specified metabolic disorders: Secondary | ICD-10-CM | POA: Diagnosis present

## 2017-02-06 DIAGNOSIS — Z8673 Personal history of transient ischemic attack (TIA), and cerebral infarction without residual deficits: Secondary | ICD-10-CM

## 2017-02-06 DIAGNOSIS — Z79899 Other long term (current) drug therapy: Secondary | ICD-10-CM

## 2017-02-06 DIAGNOSIS — I871 Compression of vein: Secondary | ICD-10-CM | POA: Diagnosis present

## 2017-02-06 DIAGNOSIS — E1141 Type 2 diabetes mellitus with diabetic mononeuropathy: Secondary | ICD-10-CM | POA: Diagnosis present

## 2017-02-06 DIAGNOSIS — I120 Hypertensive chronic kidney disease with stage 5 chronic kidney disease or end stage renal disease: Secondary | ICD-10-CM | POA: Diagnosis present

## 2017-02-06 DIAGNOSIS — E1165 Type 2 diabetes mellitus with hyperglycemia: Secondary | ICD-10-CM | POA: Diagnosis present

## 2017-02-06 DIAGNOSIS — B9689 Other specified bacterial agents as the cause of diseases classified elsewhere: Secondary | ICD-10-CM | POA: Diagnosis present

## 2017-02-06 DIAGNOSIS — E1122 Type 2 diabetes mellitus with diabetic chronic kidney disease: Secondary | ICD-10-CM | POA: Diagnosis present

## 2017-02-06 DIAGNOSIS — Z87891 Personal history of nicotine dependence: Secondary | ICD-10-CM | POA: Diagnosis not present

## 2017-02-06 DIAGNOSIS — N2581 Secondary hyperparathyroidism of renal origin: Secondary | ICD-10-CM | POA: Diagnosis not present

## 2017-02-06 DIAGNOSIS — A419 Sepsis, unspecified organism: Secondary | ICD-10-CM

## 2017-02-06 DIAGNOSIS — E871 Hypo-osmolality and hyponatremia: Secondary | ICD-10-CM

## 2017-02-06 DIAGNOSIS — Y838 Other surgical procedures as the cause of abnormal reaction of the patient, or of later complication, without mention of misadventure at the time of the procedure: Secondary | ICD-10-CM | POA: Diagnosis present

## 2017-02-06 DIAGNOSIS — I2510 Atherosclerotic heart disease of native coronary artery without angina pectoris: Secondary | ICD-10-CM | POA: Diagnosis not present

## 2017-02-06 DIAGNOSIS — Z992 Dependence on renal dialysis: Secondary | ICD-10-CM | POA: Diagnosis not present

## 2017-02-06 DIAGNOSIS — R9431 Abnormal electrocardiogram [ECG] [EKG]: Secondary | ICD-10-CM | POA: Diagnosis not present

## 2017-02-06 DIAGNOSIS — D638 Anemia in other chronic diseases classified elsewhere: Secondary | ICD-10-CM

## 2017-02-06 DIAGNOSIS — Z833 Family history of diabetes mellitus: Secondary | ICD-10-CM | POA: Diagnosis not present

## 2017-02-06 DIAGNOSIS — N186 End stage renal disease: Secondary | ICD-10-CM | POA: Diagnosis not present

## 2017-02-06 DIAGNOSIS — E118 Type 2 diabetes mellitus with unspecified complications: Secondary | ICD-10-CM

## 2017-02-06 DIAGNOSIS — H409 Unspecified glaucoma: Secondary | ICD-10-CM | POA: Diagnosis present

## 2017-02-06 DIAGNOSIS — Z79818 Long term (current) use of other agents affecting estrogen receptors and estrogen levels: Secondary | ICD-10-CM | POA: Diagnosis not present

## 2017-02-06 DIAGNOSIS — Z794 Long term (current) use of insulin: Secondary | ICD-10-CM | POA: Diagnosis not present

## 2017-02-06 DIAGNOSIS — I361 Nonrheumatic tricuspid (valve) insufficiency: Secondary | ICD-10-CM | POA: Diagnosis not present

## 2017-02-06 DIAGNOSIS — R509 Fever, unspecified: Secondary | ICD-10-CM | POA: Diagnosis not present

## 2017-02-06 LAB — COMPREHENSIVE METABOLIC PANEL
ALT: 60 U/L (ref 17–63)
AST: 45 U/L — ABNORMAL HIGH (ref 15–41)
Albumin: 3 g/dL — ABNORMAL LOW (ref 3.5–5.0)
Alkaline Phosphatase: 153 U/L — ABNORMAL HIGH (ref 38–126)
Anion gap: 14 (ref 5–15)
BUN: 63 mg/dL — ABNORMAL HIGH (ref 6–20)
CHLORIDE: 89 mmol/L — AB (ref 101–111)
CO2: 26 mmol/L (ref 22–32)
CREATININE: 5.22 mg/dL — AB (ref 0.61–1.24)
Calcium: 8.5 mg/dL — ABNORMAL LOW (ref 8.9–10.3)
GFR, EST AFRICAN AMERICAN: 13 mL/min — AB (ref >60)
GFR, EST NON AFRICAN AMERICAN: 11 mL/min — AB (ref >60)
Glucose, Bld: 494 mg/dL — ABNORMAL HIGH (ref 65–99)
POTASSIUM: 4.7 mmol/L (ref 3.5–5.1)
Sodium: 129 mmol/L — ABNORMAL LOW (ref 135–145)
Total Bilirubin: 0.7 mg/dL (ref 0.3–1.2)
Total Protein: 6.9 g/dL (ref 6.5–8.1)

## 2017-02-06 LAB — I-STAT CG4 LACTIC ACID, ED
Lactic Acid, Venous: 3.05 mmol/L (ref 0.5–1.9)
Lactic Acid, Venous: 2.64 mmol/L (ref 0.5–1.9)

## 2017-02-06 LAB — CBC WITH DIFFERENTIAL/PLATELET
Basophils Absolute: 0 10*3/uL (ref 0.0–0.1)
Basophils Relative: 0 %
EOS ABS: 0.2 10*3/uL (ref 0.0–0.7)
Eosinophils Relative: 2 %
HCT: 33.2 % — ABNORMAL LOW (ref 39.0–52.0)
Hemoglobin: 11.7 g/dL — ABNORMAL LOW (ref 13.0–17.0)
LYMPHS ABS: 0.5 10*3/uL — AB (ref 0.7–4.0)
LYMPHS PCT: 5 %
MCH: 31.6 pg (ref 26.0–34.0)
MCHC: 35.2 g/dL (ref 30.0–36.0)
MCV: 89.7 fL (ref 78.0–100.0)
Monocytes Absolute: 0.3 10*3/uL (ref 0.1–1.0)
Monocytes Relative: 3 %
NEUTROS PCT: 90 %
Neutro Abs: 9.3 10*3/uL — ABNORMAL HIGH (ref 1.7–7.7)
PLATELETS: 168 10*3/uL (ref 150–400)
RBC: 3.7 MIL/uL — AB (ref 4.22–5.81)
RDW: 13.9 % (ref 11.5–15.5)
WBC: 10.2 10*3/uL (ref 4.0–10.5)

## 2017-02-06 LAB — CBG MONITORING, ED
GLUCOSE-CAPILLARY: 118 mg/dL — AB (ref 65–99)
GLUCOSE-CAPILLARY: 388 mg/dL — AB (ref 65–99)
Glucose-Capillary: 541 mg/dL (ref 65–99)
Glucose-Capillary: 508 mg/dL (ref 65–99)
Glucose-Capillary: 206 mg/dL — ABNORMAL HIGH (ref 65–99)

## 2017-02-06 MED ORDER — DEXTROSE 5 % IV SOLN: 2.0000 g | Freq: Once | INTRAVENOUS | Status: DC

## 2017-02-06 MED ORDER — DEXTROSE 5 % IV SOLN
1.0000 g | INTRAVENOUS | Status: DC
  Administered 2017-02-06 – 2017-02-07 (×2): 1 g via INTRAVENOUS
  Filled 2017-02-06 (×2): qty 1

## 2017-02-06 MED ORDER — DOXERCALCIFEROL 4 MCG/2ML IV SOLN
1.0000 ug | INTRAVENOUS | Status: DC
  Administered 2017-02-07: 1 ug via INTRAVENOUS
  Filled 2017-02-06: qty 2

## 2017-02-06 MED ORDER — SODIUM CHLORIDE 0.9 % IV SOLN
INTRAVENOUS | Status: DC
  Administered 2017-02-06: 18:00:00 via INTRAVENOUS

## 2017-02-06 MED ORDER — DOXERCALCIFEROL 4 MCG/2ML IV SOLN: 1.0000 ug | Freq: Once | INTRAVENOUS | Status: DC

## 2017-02-06 MED ORDER — VANCOMYCIN HCL IN DEXTROSE 1-5 GM/200ML-% IV SOLN
1000.0000 mg | Freq: Once | INTRAVENOUS | Status: AC
  Administered 2017-02-06: 1000 mg via INTRAVENOUS
  Filled 2017-02-06: qty 200

## 2017-02-06 MED ORDER — SODIUM CHLORIDE 0.9 % IV SOLN: Freq: Once | INTRAVENOUS | Status: DC

## 2017-02-06 MED ORDER — SODIUM CHLORIDE 0.9 % IV SOLN
INTRAVENOUS | Status: DC
  Administered 2017-02-06: 4.8 [IU]/h via INTRAVENOUS
  Filled 2017-02-06: qty 1

## 2017-02-06 MED ORDER — INSULIN REGULAR BOLUS VIA INFUSION
0.0000 [IU] | Freq: Three times a day (TID) | INTRAVENOUS | Status: DC
  Filled 2017-02-06: qty 10

## 2017-02-06 MED ORDER — DEXTROSE 50 % IV SOLN: 25.0000 mL | INTRAVENOUS | Status: DC | PRN

## 2017-02-06 NOTE — H&P (Signed)
[facility] History and Physical  Russell Price EGB:[id number] DOB: 08/16/1960 DOA: 02/06/2017  PCP: Arnoldo Morale, MD  Patient coming from: home/[facility]  Chief Complaint: Fevers  HPI: Russell Price is a 56 y.o. male with a medical history of end-stage renal disease recently started hemodialysis, essential hypertension, coronary artery disease, diabetes, who presented to the emergency department for fever. Patient had presented to have a catheter removed and replaced given recent Enterobacter bacteremia found earlier this month. Patient has been using [place] during HD days. Patient spiked a fever of 104F after dialysis catheter placement, he was then sent to the emergency department for further workup. He also experienced chills/rigors. Currently patient denies any chest pain, shortness of breath, abdominal pain, nausea vomiting, diarrhea or constipation, changes in urinary or bowel habits, recent travel or ill contacts.  ED Course: Found to have fever of 103.1, tachycardia as well as tachypnea. Serzone sepsis protocol. Nephrology consulted. TR[facility] called for admission.  Review of Systems:  All other systems reviewed and are negative.   Past Medical History:  Diagnosis Date  . CAD (coronary artery disease)    a. underwent cath in 02/2016 after an abnormal nuc which showed 2V CAD in RCA and OM3 with no good targets for PCI. Medical managment recommended.   . Chronic kidney disease   . Diabetes mellitus without complication ([place])    followed by Dr Chalmers Cater  . ESRD (end stage renal disease) ([place])   . Glaucoma   . Hypertension    followed by [facility]  . Stroke [facility])     Past Surgical History:  Procedure Laterality Date  . A/V FISTULAGRAM Left 11/27/2016   Procedure: A/V Fistulagram;  Surgeon: Waynetta Sandy, MD;  Location: [place] [facility];  Service: Cardiovascular;  Laterality: Left;  . AV FISTULA PLACEMENT Left 03/12/2016   Procedure: Left Arm  ARTERIOVENOUS (AV) FISTULA CREATION;  Surgeon: Waynetta Sandy, MD;  Location: [place];  Service: Vascular;  Laterality: Left;  . BASCILIC VEIN TRANSPOSITION Left 05/14/2016   Procedure: SECOND STAGE LEFT BASILIC VEIN TRANSPOSITION;  Surgeon: Waynetta Sandy, MD;  Location: [facility];  Service: Vascular;  Laterality: Left;  . BRONCHOSCOPY  02/14/2015   for pulm hemorrhage  . CARDIAC CATHETERIZATION N/A 03/04/2016   Procedure: Left Heart Cath and Coronary Angiography;  Surgeon: Leonie Man, MD;  Location: [place] [facility];  Service: Cardiovascular;  Laterality: N/A;  . EYE SURGERY Left   . INSERTION OF DIALYSIS CATHETER Right 11/08/2016   Procedure: INSERTION OF DIALYSIS CATHETER;  Surgeon: Rosetta Posner, MD;  Location: [facility];  Service: Vascular;  Laterality: Right;  . IR PARACENTESIS  11/13/2016  . PERIPHERAL VASCULAR BALLOON ANGIOPLASTY Left 11/27/2016   Procedure: Peripheral Vascular Balloon Angioplasty;  Surgeon: Waynetta Sandy, MD;  Location: [facility];  Service: Cardiovascular;  Laterality: Left;  ARM FISTULA    Social History:  reports that he quit smoking about 33 years ago. He smoked 0.00 packs per day for 4.00 years. He has never used smokeless tobacco. He reports that he does not drink alcohol or use drugs.   Allergies  Allergen Reactions  . No Known Allergies     Family History  Problem Relation Age of Onset  . Diabetes Mother      Prior to Admission medications   Medication Sig Start Date End Date Taking? Authorizing Provider  aspirin 81 MG chewable tablet Chew 1 tablet (81 mg total) by mouth daily. Patient taking  differently: Chew 81 mg by mouth every evening.  09/14/15  Yes Angiulli, Lavon Paganini, PA-C  atorvastatin (LIPITOR) 40 MG tablet Take 1 tablet (40 mg total) by mouth daily at 6 PM. 09/02/16  Yes Amao, Charlane Ferretti, MD  calcitRIOL (ROCALTROL) 0.25 MCG capsule Take 0.25 mcg by mouth daily. 11/04/16  Yes [provider]  carvedilol  (COREG) 12.5 MG tablet Take 12.5 mg by mouth 2 (two) times daily with a meal.   Yes [provider]  Darbepoetin Alfa (ARANESP) 150 MCG/0.3ML SOSY injection Inject 0.3 mLs (150 mcg total) into the vein every Saturday with hemodialysis. 11/16/16  Yes Jani Gravel, MD  furosemide (LASIX) 80 MG tablet Take 80 mg by mouth 2 (two) times daily.   Yes [provider]  gabapentin (NEURONTIN) 300 MG capsule Take 1 capsule (300 mg total) by mouth daily. 09/02/16  Yes Arnoldo Morale, MD  hydrocortisone (ANUSOL-HC) 2.5 % rectal cream Place rectally 2 (two) times daily. 11/16/16  Yes Jani Gravel, MD  insulin aspart (NOVOLOG) 100 UNIT/ML injection Inject 2-10 Units into the skin 3 (three) times daily before meals. Patient taking differently: Inject 2-6 Units into the skin 3 (three) times daily before meals. Per sliding scale 09/02/16  Yes Amao, Charlane Ferretti, MD  insulin glargine (LANTUS) 100 UNIT/ML injection Inject 0.15 mLs (15 Units total) into the skin at bedtime. 09/02/16  Yes Arnoldo Morale, MD  megestrol (MEGACE) 40 MG tablet Take 1 tablet (40 mg total) by mouth daily. 12/09/16  Yes Arnoldo Morale, MD  multivitamin (RENA-VIT) TABS tablet Take 1 tablet by mouth daily.   Yes [provider]  Nutritional Supplements (FEEDING SUPPLEMENT, NEPRO CARB STEADY,) LIQD Take 237 mLs by mouth 2 (two) times daily between meals. 11/16/16  Yes Jani Gravel, MD  polyethylene glycol [facility] Floria Raveling) packet Take 17 g by mouth daily. 11/16/16  Yes Jani Gravel, MD  ACCU-CHEK AVIVA PLUS test strip USE 3 TIMES DAILY BEFORE MEALS AND AT BEDTIME 01/06/17   Arnoldo Morale, MD  Blood Glucose Monitoring Suppl (ACCU-CHEK AVIVA) device Use as instructed 3 times daily before meals and at bedtime. 12/18/15   Arnoldo Morale, MD  Lancet Devices [facility]) lancets Use as instructed 3 times daily before meals and at bedtime 12/18/15   Arnoldo Morale, MD    Physical Exam: Vitals:   02/06/17 1745 02/06/17 1800  BP: 138/64 134/64   Pulse: 84 82  Resp: (!) 27 (!) 24  Temp:    SpO2: 97% 99%     General: Well developed, well nourished, NAD, appears stated age  HEENT: NCAT, PERRLA, EOMI, Anicteic Sclera, mucous membranes moist.   Neck: Supple, no JVD, no masses  Cardiovascular: S1 S2 auscultated, no rubs, murmurs or gallops. Regular rate and rhythm. Left IJ with dressing in place.  Respiratory: Clear to auscultation bilaterally with equal chest rise  Abdomen: Soft, nontender, nondistended, + bowel sounds  Extremities: warm dry without cyanosis clubbing or edema. LUA AVF +B/T. Neuro: AAOx3, cranial nerves grossly intact. Strength 5/5 in patient's upper and lower extremities bilaterally  Skin: Without rashes exudates or nodules  Psych: Normal affect and demeanor with intact judgement and insight  Labs on Admission: I have personally reviewed following labs and imaging studies CBC:  Recent Labs Lab 02/06/17 1455  WBC 10.2  NEUTROABS 9.3*  HGB 11.7*  HCT 33.2*  MCV 89.7  PLT 268   Basic Metabolic Panel:  Recent Labs Lab 02/06/17 1455  NA 129*  K 4.7  CL 89*  CO2 26  GLUCOSE 494*  BUN 63*  CREATININE 5.22*  CALCIUM 8.5*   GFR: Estimated Creatinine Clearance: 15.8 mL/min (A) (by C-G formula based on SCr of 5.22 mg/dL (H)). Liver Function Tests:  Recent Labs Lab 02/06/17 1455  AST 45*  ALT 60  ALKPHOS 153*  BILITOT 0.7  PROT 6.9  ALBUMIN 3.0*   No results for input(s): LIPASE, AMYLASE in the last 168 hours. No results for input(s): AMMONIA in the last 168 hours. Coagulation Profile: No results for input(s): INR, PROTIME in the last 168 hours. Cardiac Enzymes: No results for input(s): CKTOTAL, CKMB, CKMBINDEX, TROPONINI in the last 168 hours. BNP (last 3 results) No results for input(s): PROBNP in the last 8760 hours. HbA1C: No results for input(s): HGBA1C in the last 72 hours. CBG: No results for input(s): GLUCAP in the last 168 hours. Lipid Profile: No results for input(s):  CHOL, HDL, LDLCALC, TRIG, CHOLHDL, LDLDIRECT in the last 72 hours. Thyroid Function Tests: No results for input(s): TSH, T4TOTAL, FREET4, T3FREE, THYROIDAB in the last 72 hours. Anemia Panel: No results for input(s): VITAMINB12, FOLATE, FERRITIN, TIBC, IRON, RETICCTPCT in the last 72 hours. Urine analysis:    Component Value Date/Time   COLORURINE AMBER (A) 11/12/2016 0719   APPEARANCEUR CLOUDY (A) 11/12/2016 0719   LABSPEC 1.015 11/12/2016 0719   PHURINE 5.0 11/12/2016 0719   GLUCOSEU 50 (A) 11/12/2016 0719   HGBUR SMALL (A) 11/12/2016 0719   BILIRUBINUR NEGATIVE 11/12/2016 0719   BILIRUBINUR negative 02/27/2015 1458   BILIRUBINUR neg 02/22/2013 1737   KETONESUR NEGATIVE 11/12/2016 0719   PROTEINUR 100 (A) 11/12/2016 0719   UROBILINOGEN 0.2 02/28/2015 0032   NITRITE NEGATIVE 11/12/2016 0719   LEUKOCYTESUR SMALL (A) 11/12/2016 0719   Sepsis Labs: @LABRCNTIP (procalcitonin:4,lacticidven:4) )No results found for this or any previous visit (from the past 240 hour(s)).   Radiological Exams on Admission: Dg Chest 2 View  Result Date: 02/06/2017 CLINICAL DATA:  Port infection. EXAM: CHEST  2 VIEW COMPARISON:  Radiograph November 12, 2016. FINDINGS: Stable cardiomediastinal silhouette. Right internal jugular dialysis catheter has been removed. Interval placement of left internal jugular dialysis catheter with distal tip in right atrium. No pneumothorax or pleural effusion is noted. No acute pulmonary disease is noted. Bony thorax is unremarkable. IMPRESSION: Interval placement of left internal jugular dialysis catheter with distal tip in right atrium. No acute cardiopulmonary abnormality seen. Electronically Signed   By: Marijo Conception, M.D.   On: 02/06/2017 15:38    EKG: Independently reviewed. Sinus rhythm, rate 97, no change from previous EKGs  Assessment/Plan  Sepsis secondary to recent Enterobacter bacteremia/line infection -Patient has been getting Fortaz 2 g IV with hemodialysis  since 01/28/2017 -Patient presented today for catheter removal and replacement, however spiked a fever of 104F -Patient currently still febrile, with tachycardia and tachypnea, elevated lactic acid -Will continue Fortaz, add on vancomycin per pharmacy -Blood cultures pending -Chest x-ray unremarkable for infection -will avoid IVF at this time given the patient has not dialyzed today inferior of patient becoming volume overloaded  ESRD -Patient dialyzes Tuesday, Thursday, Saturday -Nephrology consulted and appreciated -Patient did not dialyze today  Essential hypertension -Continue Coreg  Anemia of chronic disease -Hemoglobin currently 11.7, continue to monitor CBC  Diabetes mellitus, type II -Patient currently on Lantus and insulin sliding scale at home -Will continue Lantus, with sensitive insulin sliding scale during hospitalization and CBG monitoring -Hemoglobin A1c April 16,018 was 7.4  Coronary artery disease -Currently chest pain-free -Continue aspirin, statin, Coreg  Hyponatremia -Appears  chronic. Sodium currently 129 however has been low in June 2018. -Suspects may correct with hemodialysis -Continue to monitor BMP  DVT prophylaxis: heparin  Code Status: Full  Family Communication: Family at bedside. Admission, patients condition and plan of care including tests being ordered have been discussed with the patient and family who indicate understanding and agree with the plan and Code Status.  Disposition Plan: Home   Consults called: Nephrology   Admission status: Admitted to tele   Time spent: 70 minutes  Emmaline Wahba D.O. [facility] Pager [phone]  If 7PM-7AM, please contact night-coverage [email or web address] Password [email or web address] 02/06/2017, 6:07 PM

## 2017-02-06 NOTE — ED Provider Notes (Signed)
[facility] DEPT Provider Note   CSN: [id number] Arrival date & time: 02/06/17  1432     History   Chief Complaint Chief Complaint  Patient presents with  . Fever    HPI Russell Price is a 56 y.o. male.  HPI Patient is referred to the emergency department from [facility] due to infection of right HD chest port. Patient had been on [place] for the past 2 weeks. The port was removed this morning and a left-sided port placed. Patient spiked fever to 104. There was concern for worsening sepsis and patient was referred for sepsis evaluation and broaden spectrum of antibiotic. Patient reports that he is not having any pain. He denies chest pain, cough or shortness of breath. No vomiting or diarrhea.Patient was originally from [place]. He reports he has not traveled or been out of the [place] for many years. He denies any recent visitors or sick contacts. Past Medical History:  Diagnosis Date  . CAD (coronary artery disease)    a. underwent cath in 02/2016 after an abnormal nuc which showed 2V CAD in RCA and OM3 with no good targets for PCI. Medical managment recommended.   . Chronic kidney disease   . Diabetes mellitus without complication ([place])    followed by Dr Chalmers Cater  . ESRD (end stage renal disease) ([place])   . Glaucoma   . Hypertension    followed by [facility]  . Stroke [facility])     Patient Active Problem List   Diagnosis Date Noted  . Sepsis ([place]) 02/06/2017  . Ascites   . Hyponatremia 11/06/2016  . Fluid overload 11/06/2016  . CAD (coronary artery disease)   . Abnormal nuclear stress test 03/04/2016  . ESRD (end stage renal disease) ([place])   . Diabetic neuropathy ([place]) 10/05/2015  . Left arm weakness   . Acute encephalopathy   . Hyperlipidemia 09/21/2015  . Labile blood pressure   . Labile blood glucose   . Diabetes mellitus type 2 in nonobese ([facility])   . Anemia of chronic disease   . Debility 09/08/2015  . Acidemia   . Seizure-like activity  ([place])   . Acute kidney injury ([place])   . Cerebral thrombosis with cerebral infarction ([place]) 02/18/2015  . Essential hypertension 11/11/2014  . DM (diabetes mellitus) with complications ([place]) 73/53/2992  . Blind right eye 11/11/2014    Past Surgical History:  Procedure Laterality Date  . A/V FISTULAGRAM Left 11/27/2016   Procedure: A/V Fistulagram;  Surgeon: Waynetta Sandy, MD;  Location: [place] [facility];  Service: Cardiovascular;  Laterality: Left;  . AV FISTULA PLACEMENT Left 03/12/2016   Procedure: Left Arm ARTERIOVENOUS (AV) FISTULA CREATION;  Surgeon: Waynetta Sandy, MD;  Location: [facility];  Service: Vascular;  Laterality: Left;  . BASCILIC VEIN TRANSPOSITION Left 05/14/2016   Procedure: SECOND STAGE LEFT BASILIC VEIN TRANSPOSITION;  Surgeon: Waynetta Sandy, MD;  Location: [place];  Service: Vascular;  Laterality: Left;  . BRONCHOSCOPY  02/14/2015   for pulm hemorrhage  . CARDIAC CATHETERIZATION N/A 03/04/2016   Procedure: Left Heart Cath and Coronary Angiography;  Surgeon: Leonie Man, MD;  Location: [place] [facility];  Service: Cardiovascular;  Laterality: N/A;  . EYE SURGERY Left   . INSERTION OF DIALYSIS CATHETER Right 11/08/2016   Procedure: INSERTION OF DIALYSIS CATHETER;  Surgeon: Rosetta Posner, MD;  Location: [facility] [place];  Service: Vascular;  Laterality: Right;  . IR PARACENTESIS  11/13/2016  . PERIPHERAL VASCULAR BALLOON ANGIOPLASTY Left  11/27/2016   Procedure: Peripheral Vascular Balloon Angioplasty;  Surgeon: Waynetta Sandy, MD;  Location: [facility];  Service: Cardiovascular;  Laterality: Left;  ARM FISTULA       Home Medications    Prior to Admission medications   Medication Sig Start Date End Date Taking? Authorizing Provider  aspirin 81 MG chewable tablet Chew 1 tablet (81 mg total) by mouth daily. Patient taking differently: Chew 81 mg by mouth every evening.  09/14/15  Yes Angiulli, Lavon Paganini, PA-C  atorvastatin  (LIPITOR) 40 MG tablet Take 1 tablet (40 mg total) by mouth daily at 6 PM. 09/02/16  Yes Amao, Charlane Ferretti, MD  calcitRIOL (ROCALTROL) 0.25 MCG capsule Take 0.25 mcg by mouth daily. 11/04/16  Yes [provider]  carvedilol (COREG) 12.5 MG tablet Take 12.5 mg by mouth 2 (two) times daily with a meal.   Yes [provider]  Darbepoetin Alfa (ARANESP) 150 MCG/0.3ML SOSY injection Inject 0.3 mLs (150 mcg total) into the vein every Saturday with hemodialysis. 11/16/16  Yes Jani Gravel, MD  furosemide (LASIX) 80 MG tablet Take 80 mg by mouth 2 (two) times daily.   Yes [provider]  gabapentin (NEURONTIN) 300 MG capsule Take 1 capsule (300 mg total) by mouth daily. 09/02/16  Yes Arnoldo Morale, MD  hydrocortisone (ANUSOL-HC) 2.5 % rectal cream Place rectally 2 (two) times daily. 11/16/16  Yes Jani Gravel, MD  insulin aspart (NOVOLOG) 100 UNIT/ML injection Inject 2-10 Units into the skin 3 (three) times daily before meals. Patient taking differently: Inject 2-6 Units into the skin 3 (three) times daily before meals. Per sliding scale 09/02/16  Yes Amao, Charlane Ferretti, MD  insulin glargine (LANTUS) 100 UNIT/ML injection Inject 0.15 mLs (15 Units total) into the skin at bedtime. 09/02/16  Yes Arnoldo Morale, MD  megestrol (MEGACE) 40 MG tablet Take 1 tablet (40 mg total) by mouth daily. 12/09/16  Yes Arnoldo Morale, MD  multivitamin (RENA-VIT) TABS tablet Take 1 tablet by mouth daily.   Yes [provider]  Nutritional Supplements (FEEDING SUPPLEMENT, NEPRO CARB STEADY,) LIQD Take 237 mLs by mouth 2 (two) times daily between meals. 11/16/16  Yes Jani Gravel, MD  polyethylene glycol [facility] / Floria Raveling) packet Take 17 g by mouth daily. 11/16/16  Yes Jani Gravel, MD  ACCU-CHEK AVIVA PLUS test strip USE 3 TIMES DAILY BEFORE MEALS AND AT BEDTIME 01/06/17   Arnoldo Morale, MD  Blood Glucose Monitoring Suppl (ACCU-CHEK AVIVA) device Use as instructed 3 times daily before meals and at bedtime. 12/18/15    Arnoldo Morale, MD  Lancet Devices [facility]) lancets Use as instructed 3 times daily before meals and at bedtime 12/18/15   Arnoldo Morale, MD    Family History Family History  Problem Relation Age of Onset  . Diabetes Mother     Social History Social History  Substance Use Topics  . Smoking status: Former Smoker    Packs/day: 0.00    Years: 4.00    Quit date: 02/23/1983  . Smokeless tobacco: Never Used  . Alcohol use No     Allergies   No known allergies   Review of Systems Review of Systems 10 Systems reviewed and are negative for acute change except as noted in the HPI.   Physical Exam Updated Vital Signs BP 134/64   Pulse 82   Temp (!) 103.1 F (39.5 C) (Oral)   Resp (!) 24   Ht 5\' 11"  (1.803 m)   Wt 72.6 kg (160 lb)  SpO2 99%   BMI 22.32 kg/m   Physical Exam  Constitutional: He is oriented to person, place, and time.  Patient is alert and nontoxic. No respiratory distress. Mental status clear.  HENT:  Head: Normocephalic and atraumatic.  Nose: Nose normal.  Mouth/Throat: Oropharynx is clear and moist.  Eyes: Pupils are equal, round, and reactive to light. EOM are normal.  Patient has some small sub-conjunctiva hemorrhages left lateral eye.  Neck: Neck supple.  Cardiovascular: Normal rate and regular rhythm.   Pulmonary/Chest: Effort normal and breath sounds normal.  Abdominal: Soft. He exhibits no distension. There is no tenderness. There is no guarding.  Musculoskeletal: Normal range of motion. He exhibits no edema or tenderness.  Neurological: He is alert and oriented to person, place, and time. No cranial nerve deficit. He exhibits normal muscle tone. Coordination normal.  Skin: Skin is warm and dry. No rash noted.  Psychiatric: He has a normal mood and affect.     ED Treatments / Results  Labs (all labs ordered are listed, but only abnormal results are displayed) Labs Reviewed  COMPREHENSIVE METABOLIC PANEL - Abnormal; Notable for  the following:       Result Value   Sodium 129 (*)    Chloride 89 (*)    Glucose, Bld 494 (*)    BUN 63 (*)    Creatinine, Ser 5.22 (*)    Calcium 8.5 (*)    Albumin 3.0 (*)    AST 45 (*)    Alkaline Phosphatase 153 (*)    GFR calc non Af Amer 11 (*)    GFR calc Af Amer 13 (*)    All other components within normal limits  CBC WITH DIFFERENTIAL/PLATELET - Abnormal; Notable for the following:    RBC 3.70 (*)    Hemoglobin 11.7 (*)    HCT 33.2 (*)    Neutro Abs 9.3 (*)    Lymphs Abs 0.5 (*)    All other components within normal limits  I-STAT CG4 LACTIC ACID, ED - Abnormal; Notable for the following:    Lactic Acid, Venous 3.05 (*)    All other components within normal limits  CULTURE, BLOOD (ROUTINE X 2)  CULTURE, BLOOD (ROUTINE X 2)  URINALYSIS, ROUTINE W REFLEX MICROSCOPIC  I-STAT CG4 LACTIC ACID, ED  I-STAT CG4 LACTIC ACID, ED    EKG  EKG Interpretation  Date/Time:  Thursday February 06 2017 14:52:33 EDT Ventricular Rate:  97 PR Interval:    QRS Duration: 82 QT Interval:  338 QTC Calculation: 430 R Axis:   45 Text Interpretation:  Sinus rhythm Right atrial enlargement Probable LVH with secondary repol abnrm Inferior lateral ST depression. No significant change from previous. Confirmed by Charlesetta Shanks [phone]) on 02/06/2017 6:13:15 PM       Radiology Dg Chest 2 View  Result Date: 02/06/2017 CLINICAL DATA:  Port infection. EXAM: CHEST  2 VIEW COMPARISON:  Radiograph November 12, 2016. FINDINGS: Stable cardiomediastinal silhouette. Right internal jugular dialysis catheter has been removed. Interval placement of left internal jugular dialysis catheter with distal tip in right atrium. No pneumothorax or pleural effusion is noted. No acute pulmonary disease is noted. Bony thorax is unremarkable. IMPRESSION: Interval placement of left internal jugular dialysis catheter with distal tip in right atrium. No acute cardiopulmonary abnormality seen. Electronically Signed   By:  Marijo Conception, M.D.   On: 02/06/2017 15:38    Procedures Procedures (including critical care time) CRITICAL CARE Performed by: Charlesetta Shanks   Total critical  care time: 30 minutes  Critical care time was exclusive of separately billable procedures and treating other patients.  Critical care was necessary to treat or prevent imminent or life-threatening deterioration.  Critical care was time spent personally by me on the following activities: development of treatment plan with patient and/or surrogate as well as nursing, discussions with consultants, evaluation of patient's response to treatment, examination of patient, obtaining history from patient or surrogate, ordering and performing treatments and interventions, ordering and review of laboratory studies, ordering and review of radiographic studies, pulse oximetry and re-evaluation of patient's condition. Medications Ordered in ED Medications  vancomycin (VANCOCIN) IVPB 1000 mg/200 mL premix (1,000 mg Intravenous New Bag/Given 02/06/17 1730)  cefTAZidime (FORTAZ) 1 g in dextrose 5 % 50 mL IVPB (0 g Intravenous Stopped 02/06/17 1730)  doxercalciferol (HECTOROL) injection 1 mcg (not administered)  doxercalciferol (HECTOROL) injection 1 mcg (not administered)  insulin regular bolus via infusion 0-10 Units (not administered)  insulin regular (NOVOLIN R,HUMULIN R) 100 Units in sodium chloride 0.9 % 100 mL (1 Units/mL) infusion (not administered)  dextrose 50 % solution 25 mL (not administered)  0.9 %  sodium chloride infusion ( Intravenous New Bag/Given 02/06/17 1738)     Initial Impression / Assessment and Plan / ED Course  I have reviewed the triage vital signs and the nursing notes.  Pertinent labs & imaging results that were available during my care of the patient were reviewed by me and considered in my medical decision making (see chart for details).    Consult: [facility] PA-C [place] evaluated the patient emergency  department.Recommendation is to continue [place] and add vancomycin. With stable blood pressures and heart rate, do not advise for fluid bolus at this time. Consult: [facility] Dr. Ree Kida evaluated the patient for admission.  Final Clinical Impressions(s) / ED Diagnoses   Final diagnoses:  Sepsis, due to unspecified organism ([place])  ESRD (end stage renal disease) on dialysis [facility])   Patient presents as outlined above with prior Enterobacter Positive culture and indwelling hemodialysis catheter. Patient is referred to the emergency department today for fever spike. Evaluation and initial treatment for sepsis. Fluid is not administered based on consultation with nephrology and patient condition. New Prescriptions New Prescriptions   No medications on file     Charlesetta Shanks, MD 02/06/17 1818

## 2017-02-06 NOTE — ED Triage Notes (Signed)
Pt sent here from [place] [facility] due to infection in port site on the right chest that was removed this morning. Pt has new port in left chest. The port in the right chest had been in 2 months for dialysis. Oral temp 103.1 in triage.

## 2017-02-06 NOTE — ED Notes (Signed)
Pt's CBG result was 388. Informed Minna Merritts - RN.

## 2017-02-06 NOTE — ED Notes (Signed)
1 set of blood cultures drawn with IV start and kept if needed.

## 2017-02-06 NOTE — Consult Note (Signed)
[facility] Renal Consultation Note    Indication for Consultation:  Management of ESRD/hemodialysis; anemia, hypertension/volume and secondary hyperparathyroidism PCP: Arnoldo Morale, MD  HPI: Russell Price is a 56 y.o. [place] male [place].  He has a 20 + year history of DM, HTN, CAD who started HD earlier this year. He currently dialyzes at [place] on a TTS schedule.  He has had problems over time with his AVF which has included multiple procedures.  Most recently he had a fistulagram with PTA of mid and outflow basilic vein stenosis 4/09.  He has been using a tunneled dialysis catheter and spiked a temperature at his 9/11 treatment to 100.3  Blood cultures were drawn and + x 2 for Enterobacter aerogenes.  His antiobitics were narrowed from [place] to [place] which he has received q HD since 9/11.  Arrangements were made for catheter removal and repalcement 9/20 at [facility].  The staff was unable to cannulate his AVF this past week even after his intervention.  Today, he had chills/rigors with fever spike during the procedure today and was transferred to [facility] ED for further evaluation .  Temperature upon arrival was 103.1  WBC is 10.2 with  Lactic acid 3.05 Glu 494 K 4.7. Initial BP was 112/93.and is now 135/60.  CXR was negative for infiltrate.  He has no obvious source of infection other that catheter. He is due for dialysis today.  Past Medical History:  Diagnosis Date  . CAD (coronary artery disease)    a. underwent cath in 02/2016 after an abnormal nuc which showed 2V CAD in RCA and OM3 with no good targets for PCI. Medical managment recommended.   . Chronic kidney disease   . Diabetes mellitus without complication ([place])    followed by Dr Chalmers Cater  . ESRD (end stage renal disease) ([place])   . Glaucoma   . Hypertension    followed by [facility]  . Stroke [facility])    Past Surgical History:  Procedure Laterality Date  . A/V FISTULAGRAM  Left 11/27/2016   Procedure: A/V Fistulagram;  Surgeon: Waynetta Sandy, MD;  Location: [place] [facility];  Service: Cardiovascular;  Laterality: Left;  . AV FISTULA PLACEMENT Left 03/12/2016   Procedure: Left Arm ARTERIOVENOUS (AV) FISTULA CREATION;  Surgeon: Waynetta Sandy, MD;  Location: [place];  Service: Vascular;  Laterality: Left;  . BASCILIC VEIN TRANSPOSITION Left 05/14/2016   Procedure: SECOND STAGE LEFT BASILIC VEIN TRANSPOSITION;  Surgeon: Waynetta Sandy, MD;  Location: [place];  Service: Vascular;  Laterality: Left;  . BRONCHOSCOPY  02/14/2015   for pulm hemorrhage  . CARDIAC CATHETERIZATION N/A 03/04/2016   Procedure: Left Heart Cath and Coronary Angiography;  Surgeon: Leonie Man, MD;  Location: [place] CV LAB;  Service: Cardiovascular;  Laterality: N/A;  . EYE SURGERY Left   . INSERTION OF DIALYSIS CATHETER Right 11/08/2016   Procedure: INSERTION OF DIALYSIS CATHETER;  Surgeon: Rosetta Posner, MD;  Location: [place];  Service: Vascular;  Laterality: Right;  . IR PARACENTESIS  11/13/2016  . PERIPHERAL VASCULAR BALLOON ANGIOPLASTY Left 11/27/2016   Procedure: Peripheral Vascular Balloon Angioplasty;  Surgeon: Waynetta Sandy, MD;  Location: [place] CV LAB;  Service: Cardiovascular;  Laterality: Left;  ARM FISTULA   Family History  Problem Relation Age of Onset  . Diabetes Mother    Social History:  reports that he quit smoking about 33 years ago. He smoked  0.00 packs per day for 4.00 years. He has never used smokeless tobacco. He reports that he does not drink alcohol or use drugs. Allergies  Allergen Reactions  . No Known Allergies    Prior to Admission medications   Medication Sig Start Date End Date Taking? Authorizing Provider  ACCU-CHEK AVIVA PLUS test strip USE 3 TIMES DAILY BEFORE MEALS AND AT BEDTIME 01/06/17   Arnoldo Morale, MD  aspirin 81 MG chewable tablet Chew 1 tablet (81 mg total) by mouth daily. Patient taking  differently: Chew 81 mg by mouth every evening.  09/14/15   Angiulli, Lavon Paganini, PA-C  atorvastatin (LIPITOR) 40 MG tablet Take 1 tablet (40 mg total) by mouth daily at 6 PM. 09/02/16   Arnoldo Morale, MD  Blood Glucose Monitoring Suppl (ACCU-CHEK AVIVA) device Use as instructed 3 times daily before meals and at bedtime. 12/18/15   Arnoldo Morale, MD  calcitRIOL (ROCALTROL) 0.25 MCG capsule Take 0.25 mcg by mouth daily. 11/04/16   [provider]  carvedilol (COREG) 12.5 MG tablet Take 12.5 mg by mouth 2 (two) times daily with a meal.    [provider]  Darbepoetin Alfa (ARANESP) 150 MCG/0.3ML SOSY injection Inject 0.3 mLs (150 mcg total) into the vein every Saturday with hemodialysis. 11/16/16   Jani Gravel, MD  furosemide (LASIX) 80 MG tablet Take 80 mg by mouth 2 (two) times daily.    [provider]  gabapentin (NEURONTIN) 300 MG capsule Take 1 capsule (300 mg total) by mouth daily. 09/02/16   Arnoldo Morale, MD  hydrocortisone (ANUSOL-HC) 2.5 % rectal cream Place rectally 2 (two) times daily. 11/16/16   Jani Gravel, MD  insulin aspart (NOVOLOG) 100 UNIT/ML injection Inject 2-10 Units into the skin 3 (three) times daily before meals. Patient taking differently: Inject 2-6 Units into the skin 3 (three) times daily before meals. Per sliding scale 09/02/16   Arnoldo Morale, MD  insulin glargine (LANTUS) 100 UNIT/ML injection Inject 0.15 mLs (15 Units total) into the skin at bedtime. 09/02/16   Arnoldo Morale, MD  Lancet Devices [facility]) lancets Use as instructed 3 times daily before meals and at bedtime 12/18/15   Arnoldo Morale, MD  megestrol (MEGACE) 40 MG tablet Take 1 tablet (40 mg total) by mouth daily. 12/09/16   Arnoldo Morale, MD  multivitamin (RENA-VIT) TABS tablet Take 1 tablet by mouth daily.    [provider]  Nutritional Supplements (FEEDING SUPPLEMENT, NEPRO CARB STEADY,) LIQD Take 237 mLs by mouth 2 (two) times daily between meals. 11/16/16   Jani Gravel, MD   polyethylene glycol [facility] Floria Raveling) packet Take 17 g by mouth daily. 11/16/16   Jani Gravel, MD   Current Facility-Administered Medications  Medication Dose Route Frequency Provider Last Rate Last Dose  . cefTAZidime (FORTAZ) 1 g in dextrose 5 % 50 mL IVPB  1 g Intravenous Q24H Pfeiffer, Marcy, MD      . vancomycin (VANCOCIN) IVPB 1000 mg/200 mL premix  1,000 mg Intravenous Once Charlesetta Shanks, MD       Current Outpatient Prescriptions  Medication Sig Dispense Refill  . ACCU-CHEK AVIVA PLUS test strip USE 3 TIMES DAILY BEFORE MEALS AND AT BEDTIME 100 each 12  . aspirin 81 MG chewable tablet Chew 1 tablet (81 mg total) by mouth daily. (Patient taking differently: Chew 81 mg by mouth every evening. )    . atorvastatin (LIPITOR) 40 MG tablet Take 1 tablet (40 mg total) by mouth daily at 6 PM. 30 tablet 5  .  Blood Glucose Monitoring Suppl (ACCU-CHEK AVIVA) device Use as instructed 3 times daily before meals and at bedtime. 1 each 0  . calcitRIOL (ROCALTROL) 0.25 MCG capsule Take 0.25 mcg by mouth daily.  6  . carvedilol (COREG) 12.5 MG tablet Take 12.5 mg by mouth 2 (two) times daily with a meal.    . Darbepoetin Alfa (ARANESP) 150 MCG/0.3ML SOSY injection Inject 0.3 mLs (150 mcg total) into the vein every Saturday with hemodialysis. 1.68 mL 0  . furosemide (LASIX) 80 MG tablet Take 80 mg by mouth 2 (two) times daily.    Marland Kitchen gabapentin (NEURONTIN) 300 MG capsule Take 1 capsule (300 mg total) by mouth daily. 30 capsule 5  . hydrocortisone (ANUSOL-HC) 2.5 % rectal cream Place rectally 2 (two) times daily. 30 g 0  . insulin aspart (NOVOLOG) 100 UNIT/ML injection Inject 2-10 Units into the skin 3 (three) times daily before meals. (Patient taking differently: Inject 2-6 Units into the skin 3 (three) times daily before meals. Per sliding scale) 10 mL 5  . insulin glargine (LANTUS) 100 UNIT/ML injection Inject 0.15 mLs (15 Units total) into the skin at bedtime. 10 mL 5  . Lancet Devices (ACCU-CHEK  SOFTCLIX) lancets Use as instructed 3 times daily before meals and at bedtime 1 each 12  . megestrol (MEGACE) 40 MG tablet Take 1 tablet (40 mg total) by mouth daily. 30 tablet 0  . multivitamin (RENA-VIT) TABS tablet Take 1 tablet by mouth daily.    . Nutritional Supplements (FEEDING SUPPLEMENT, NEPRO CARB STEADY,) LIQD Take 237 mLs by mouth 2 (two) times daily between meals. 60 Can 0  . polyethylene glycol (MIRALAX / GLYCOLAX) packet Take 17 g by mouth daily. 14 each 0   Labs: Basic Metabolic Panel:  Recent Labs Lab 02/06/17 1455  NA 129*  K 4.7  CL 89*  CO2 26  GLUCOSE 494*  BUN 63*  CREATININE 5.22*  CALCIUM 8.5*   Liver Function Tests:  Recent Labs Lab 02/06/17 1455  AST 45*  ALT 60  ALKPHOS 153*  BILITOT 0.7  PROT 6.9  ALBUMIN 3.0*   CBC:  Recent Labs Lab 02/06/17 1455  WBC 10.2  NEUTROABS 9.3*  HGB 11.7*  HCT 33.2*  MCV 89.7  PLT 168   Studies/Results: Dg Chest 2 View  Result Date: 02/06/2017 CLINICAL DATA:  Port infection. EXAM: CHEST  2 VIEW COMPARISON:  Radiograph November 12, 2016. FINDINGS: Stable cardiomediastinal silhouette. Right internal jugular dialysis catheter has been removed. Interval placement of left internal jugular dialysis catheter with distal tip in right atrium. No pneumothorax or pleural effusion is noted. No acute pulmonary disease is noted. Bony thorax is unremarkable. IMPRESSION: Interval placement of left internal jugular dialysis catheter with distal tip in right atrium. No acute cardiopulmonary abnormality seen. Electronically Signed   By: Marijo Conception, M.D.   On: 02/06/2017 15:38    ROS: General: No weight loss, Did have chills once since he has been on antibiotics; no recent travel.  HEENT: No recent headaches,   visual changes, sore throat  Neurologic: No dizziness, Cardiac: No recent episodes of chest pain/pressure,  shortness of breath at rest or DOE.  Vascular: No ulcers  Pulmonary: No home oxygen,no cough,  hemoptysis, Musculoskeletal: no arthritis, low back pain, or joint pain  Gastrointestinal: No hematochezia or melena, No gastroesophageal reflux, no trouble swallowing  Urinary: Makes urine - no dysuria  Skin: No rashes or lesions Psychological: No  anxiety or depression   Physical Exam: Vitals:  02/06/17 1436 02/06/17 1501  BP: 138/60 135/60  Pulse: (!) 112 93  Resp: 17 (!) 29  Temp: (!) 103.1 F (39.5 C)   TempSrc: Oral   SpO2: 98% 99%  Weight: 72.6 kg (160 lb)   Height: 5\' 11"  (1.803 m)      General: WDWN slender [place] gentleman Head: NCAT sclera injected MMM, dentition fair, no exudates Neck: Supple.  No LAD Lungs: CTA bilaterally without wheezes, rales, or rhonchi. Breathing is unlabored. Heart: RRR with S1 S2.  Abdomen: soft NT + BS Skin: no rash, dry not to touch Lower extremities:without edema or ischemic changes, no open wounds  Neuro: A & O  X 3. Moves all extremities spontaneously. Psych:  Responds to questions appropriately with a normal affect. Dialysis Access: left AVF + bruit no obvious source of infection ; small bleeding over old [facility] sight right chest and new left IJ [facility]   Dialysis Orders: NWF TTS 4.25 hr EDW 64 w K 2.25 Ca heparin 2200 Hecotorol L AVF and new left IJ - 9/20 - right IJ removed 9/20  Assessment/Plan: 1. Enterobacter bacteremia 01/28/17 has been on Fortaz 2 gm IV q HD since that date; had fever spike today during catheter removal and replacement - sent emergently to [facility] for further work up.BC being drawn; Vanc to be added; continuing [place] 2. ESRD -  TTS - defer HD until Friday K 4.7 - no emergent need for dialysis today.  Better to wait until antibiotics on board.  3. Hypertension/volume  - BP/vol controlled on coreg 12.5 bid- BP stable - no need for fluid bolus. Have discussed with EDP. 4. Anemia  - hgb 11.7 not on ESA or Fe 5. Metabolic bone disease -  Continue hectorol - on renvela 2 ac - 6. Nutrition - will need renal carb mod  diet/multi vitamin 7. DM - elevated blood sugar - per primary - likely cause of low Na (last A1c 7.1 12/2016)  Myriam Jacobson, PA-C [place] 02/06/2017, 4:26 PM

## 2017-02-07 ENCOUNTER — Other Ambulatory Visit (HOSPITAL_COMMUNITY): Payer: Medicare Other

## 2017-02-07 ENCOUNTER — Inpatient Hospital Stay (HOSPITAL_COMMUNITY): Payer: Medicare Other

## 2017-02-07 DIAGNOSIS — I361 Nonrheumatic tricuspid (valve) insufficiency: Secondary | ICD-10-CM

## 2017-02-07 LAB — ECHOCARDIOGRAM COMPLETE
AVLVOTPG: 10 mmHg
E/e' ratio: 8.82
EWDT: 194 ms
FS: 30 % (ref 28–44)
Height: 71 in
IVS/LV PW RATIO, ED: 1.06
LA diam index: 2.38 cm/m2
LA vol A4C: 39.6 ml
LASIZE: 43 mm
LDCA: 3.14 cm2
LEFT ATRIUM END SYS DIAM: 43 mm
LV E/e' medial: 8.82
LV E/e'average: 8.82
LV PW d: 11.4 mm — AB (ref 0.6–1.1)
LV TDI E'LATERAL: 10.2
LV TDI E'MEDIAL: 6.2
LVELAT: 10.2 cm/s
LVOT VTI: 25.9 cm
LVOTD: 20 mm
LVOTPV: 155 cm/s
LVOTSV: 81 mL
MV Dec: 194
MV Peak grad: 3 mmHg
MV pk E vel: 90 m/s
RV LATERAL S' VELOCITY: 17.1 cm/s
RV TAPSE: 22.6 mm
Weight: 2246.93 oz

## 2017-02-07 LAB — LACTIC ACID, PLASMA
LACTIC ACID, VENOUS: 1.2 mmol/L (ref 0.5–1.9)
LACTIC ACID, VENOUS: 1 mmol/L (ref 0.5–1.9)

## 2017-02-07 LAB — COMPREHENSIVE METABOLIC PANEL
ALK PHOS: 145 U/L — AB (ref 38–126)
ALT: 58 U/L (ref 17–63)
ANION GAP: 12 (ref 5–15)
AST: 41 U/L (ref 15–41)
Albumin: 2.9 g/dL — ABNORMAL LOW (ref 3.5–5.0)
BUN: 71 mg/dL — ABNORMAL HIGH (ref 6–20)
CALCIUM: 8.7 mg/dL — AB (ref 8.9–10.3)
CO2: 26 mmol/L (ref 22–32)
CREATININE: 5.72 mg/dL — AB (ref 0.61–1.24)
Chloride: 93 mmol/L — ABNORMAL LOW (ref 101–111)
GFR, EST AFRICAN AMERICAN: 11 mL/min — AB (ref >60)
GFR, EST NON AFRICAN AMERICAN: 10 mL/min — AB (ref >60)
Glucose, Bld: 192 mg/dL — ABNORMAL HIGH (ref 65–99)
Potassium: 4.2 mmol/L (ref 3.5–5.1)
Sodium: 131 mmol/L — ABNORMAL LOW (ref 135–145)
TOTAL PROTEIN: 7 g/dL (ref 6.5–8.1)
Total Bilirubin: 0.7 mg/dL (ref 0.3–1.2)

## 2017-02-07 LAB — CBC WITH DIFFERENTIAL/PLATELET
BASOS ABS: 0 10*3/uL (ref 0.0–0.1)
Basophils Relative: 0 %
EOS ABS: 0.4 10*3/uL (ref 0.0–0.7)
EOS PCT: 3 %
HCT: 32.4 % — ABNORMAL LOW (ref 39.0–52.0)
Hemoglobin: 11.3 g/dL — ABNORMAL LOW (ref 13.0–17.0)
LYMPHS ABS: 1.9 10*3/uL (ref 0.7–4.0)
Lymphocytes Relative: 15 %
MCH: 31.5 pg (ref 26.0–34.0)
MCHC: 34.9 g/dL (ref 30.0–36.0)
MCV: 90.3 fL (ref 78.0–100.0)
MONO ABS: 0.6 10*3/uL (ref 0.1–1.0)
Monocytes Relative: 5 %
Neutro Abs: 10 10*3/uL — ABNORMAL HIGH (ref 1.7–7.7)
Neutrophils Relative %: 77 %
Platelets: 155 10*3/uL (ref 150–400)
RBC: 3.59 MIL/uL — AB (ref 4.22–5.81)
RDW: 14 % (ref 11.5–15.5)
WBC: 12.9 10*3/uL — AB (ref 4.0–10.5)

## 2017-02-07 LAB — PROTIME-INR
INR: 1.09
Prothrombin Time: 14 seconds (ref 11.4–15.2)

## 2017-02-07 LAB — GLUCOSE, CAPILLARY
GLUCOSE-CAPILLARY: 163 mg/dL — AB (ref 65–99)
GLUCOSE-CAPILLARY: 354 mg/dL — AB (ref 65–99)
GLUCOSE-CAPILLARY: 358 mg/dL — AB (ref 65–99)
Glucose-Capillary: 169 mg/dL — ABNORMAL HIGH (ref 65–99)

## 2017-02-07 LAB — PROCALCITONIN: PROCALCITONIN: 138.6 ng/mL

## 2017-02-07 LAB — HEPATITIS B SURFACE ANTIGEN: HEP B S AG: NEGATIVE

## 2017-02-07 LAB — HIV ANTIBODY (ROUTINE TESTING W REFLEX): HIV SCREEN 4TH GENERATION: NONREACTIVE

## 2017-02-07 LAB — MRSA PCR SCREENING: MRSA BY PCR: NEGATIVE

## 2017-02-07 LAB — APTT: APTT: 27 s (ref 24–36)

## 2017-02-07 LAB — CBG MONITORING, ED: Glucose-Capillary: 100 mg/dL — ABNORMAL HIGH (ref 65–99)

## 2017-02-07 MED ORDER — ONDANSETRON HCL 4 MG PO TABS: 4.0000 mg | ORAL_TABLET | Freq: Four times a day (QID) | ORAL | Status: DC | PRN

## 2017-02-07 MED ORDER — ONDANSETRON HCL 4 MG/2ML IJ SOLN: 4.0000 mg | Freq: Four times a day (QID) | INTRAMUSCULAR | Status: DC | PRN

## 2017-02-07 MED ORDER — INSULIN ASPART 100 UNIT/ML ~~LOC~~ SOLN
0.0000 [IU] | Freq: Three times a day (TID) | SUBCUTANEOUS | Status: DC
  Administered 2017-02-07: 2 [IU] via SUBCUTANEOUS
  Administered 2017-02-07: 9 [IU] via SUBCUTANEOUS
  Administered 2017-02-08 (×2): 7 [IU] via SUBCUTANEOUS

## 2017-02-07 MED ORDER — DARBEPOETIN ALFA 150 MCG/0.3ML IJ SOSY: 150.0000 ug | PREFILLED_SYRINGE | INTRAMUSCULAR | Status: DC

## 2017-02-07 MED ORDER — INSULIN ASPART 100 UNIT/ML ~~LOC~~ SOLN
4.0000 [IU] | Freq: Once | SUBCUTANEOUS | Status: AC
  Administered 2017-02-07: 4 [IU] via SUBCUTANEOUS

## 2017-02-07 MED ORDER — HYDROCORTISONE 2.5 % RE CREA
TOPICAL_CREAM | Freq: Two times a day (BID) | RECTAL | Status: DC
  Administered 2017-02-07: 22:00:00 via RECTAL
  Filled 2017-02-07: qty 28.35

## 2017-02-07 MED ORDER — LIDOCAINE-PRILOCAINE 2.5-2.5 % EX CREA
1.0000 | TOPICAL_CREAM | CUTANEOUS | Status: DC | PRN
Start: 2017-02-07 — End: 2017-02-07

## 2017-02-07 MED ORDER — HEPARIN SODIUM (PORCINE) 1000 UNIT/ML DIALYSIS: 1000.0000 [IU] | INTRAMUSCULAR | Status: DC | PRN

## 2017-02-07 MED ORDER — ACETAMINOPHEN 325 MG PO TABS
650.0000 mg | ORAL_TABLET | Freq: Four times a day (QID) | ORAL | Status: DC | PRN
Start: 2017-02-07 — End: 2017-02-08

## 2017-02-07 MED ORDER — LIDOCAINE HCL (PF) 1 % IJ SOLN: 5.0000 mL | INTRAMUSCULAR | Status: DC | PRN

## 2017-02-07 MED ORDER — ATORVASTATIN CALCIUM 40 MG PO TABS
40.0000 mg | ORAL_TABLET | Freq: Every day | ORAL | Status: DC
  Administered 2017-02-07: 40 mg via ORAL
  Filled 2017-02-07: qty 1

## 2017-02-07 MED ORDER — SODIUM CHLORIDE 0.9 % IV SOLN: 100.0000 mL | INTRAVENOUS | Status: DC | PRN

## 2017-02-07 MED ORDER — CALCITRIOL 0.25 MCG PO CAPS
0.2500 ug | ORAL_CAPSULE | Freq: Every day | ORAL | Status: DC
  Administered 2017-02-07 – 2017-02-08 (×2): 0.25 ug via ORAL
  Filled 2017-02-07 (×2): qty 1

## 2017-02-07 MED ORDER — DEXTROSE-NACL 5-0.45 % IV SOLN
INTRAVENOUS | Status: DC
  Administered 2017-02-07: 01:00:00 via INTRAVENOUS

## 2017-02-07 MED ORDER — HEPARIN SODIUM (PORCINE) 5000 UNIT/ML IJ SOLN
5000.0000 [IU] | Freq: Three times a day (TID) | INTRAMUSCULAR | Status: DC
  Administered 2017-02-07 – 2017-02-08 (×4): 5000 [IU] via SUBCUTANEOUS
  Filled 2017-02-07 (×4): qty 1

## 2017-02-07 MED ORDER — DEXTROSE 5 % IV SOLN
1.0000 g | INTRAVENOUS | Status: DC
  Administered 2017-02-07: 1 g via INTRAVENOUS
  Filled 2017-02-07 (×2): qty 1

## 2017-02-07 MED ORDER — POLYETHYLENE GLYCOL 3350 17 G PO PACK
17.0000 g | PACK | Freq: Every day | ORAL | Status: DC
  Administered 2017-02-08: 17 g via ORAL
  Filled 2017-02-07 (×2): qty 1

## 2017-02-07 MED ORDER — CARVEDILOL 12.5 MG PO TABS
12.5000 mg | ORAL_TABLET | Freq: Two times a day (BID) | ORAL | Status: DC
  Administered 2017-02-07 – 2017-02-08 (×3): 12.5 mg via ORAL
  Filled 2017-02-07 (×3): qty 1

## 2017-02-07 MED ORDER — ACETAMINOPHEN 650 MG RE SUPP: 650.0000 mg | Freq: Four times a day (QID) | RECTAL | Status: DC | PRN

## 2017-02-07 MED ORDER — RENA-VITE PO TABS: 1.0000 | ORAL_TABLET | Freq: Every day | ORAL | Status: DC

## 2017-02-07 MED ORDER — NEPRO/CARBSTEADY PO LIQD
237.0000 mL | Freq: Two times a day (BID) | ORAL | Status: DC
  Administered 2017-02-07: 237 mL via ORAL
  Filled 2017-02-07 (×6): qty 237

## 2017-02-07 MED ORDER — GABAPENTIN 300 MG PO CAPS
300.0000 mg | ORAL_CAPSULE | Freq: Every day | ORAL | Status: DC
  Administered 2017-02-07 – 2017-02-08 (×2): 300 mg via ORAL
  Filled 2017-02-07 (×2): qty 1

## 2017-02-07 MED ORDER — ALTEPLASE 2 MG IJ SOLR: 2.0000 mg | Freq: Once | INTRAMUSCULAR | Status: DC | PRN

## 2017-02-07 MED ORDER — ASPIRIN 81 MG PO CHEW
81.0000 mg | CHEWABLE_TABLET | Freq: Every day | ORAL | Status: DC
  Administered 2017-02-07 – 2017-02-08 (×2): 81 mg via ORAL
  Filled 2017-02-07 (×2): qty 1

## 2017-02-07 MED ORDER — MEGESTROL ACETATE 40 MG PO TABS
40.0000 mg | ORAL_TABLET | Freq: Every day | ORAL | Status: DC
  Administered 2017-02-07: 40 mg via ORAL
  Filled 2017-02-07 (×2): qty 1

## 2017-02-07 MED ORDER — SODIUM CHLORIDE 0.9 % IV SOLN
500.0000 mg | INTRAVENOUS | Status: AC
  Administered 2017-02-07: 500 mg via INTRAVENOUS
  Filled 2017-02-07: qty 500

## 2017-02-07 MED ORDER — INSULIN GLARGINE 100 UNIT/ML ~~LOC~~ SOLN
15.0000 [IU] | Freq: Every day | SUBCUTANEOUS | Status: DC
  Administered 2017-02-07: 15 [IU] via SUBCUTANEOUS
  Filled 2017-02-07 (×2): qty 0.15

## 2017-02-07 MED ORDER — VANCOMYCIN HCL IN DEXTROSE 1-5 GM/200ML-% IV SOLN: 1000.0000 mg | Freq: Once | INTRAVENOUS | Status: DC

## 2017-02-07 MED ORDER — VANCOMYCIN HCL IN DEXTROSE 750-5 MG/150ML-% IV SOLN
750.0000 mg | Freq: Once | INTRAVENOUS | Status: AC
  Administered 2017-02-07: 750 mg via INTRAVENOUS
  Filled 2017-02-07: qty 150

## 2017-02-07 MED ORDER — PENTAFLUOROPROP-TETRAFLUOROETH EX AERO: 1.0000 "application " | INHALATION_SPRAY | CUTANEOUS | Status: DC | PRN

## 2017-02-07 MED ORDER — VANCOMYCIN HCL IN DEXTROSE 750-5 MG/150ML-% IV SOLN: 750.0000 mg | INTRAVENOUS | Status: DC

## 2017-02-07 NOTE — Progress Notes (Signed)
Pharmacy Antibiotic Note  Real THERMON ZULAUF is a 56 y.o. male admitted on 02/06/2017 with sepsis.  Pharmacy has been consulted for vancomycin dosing.  Has been on [place] w/ HD since 9/11, continued on admission.  Plan: Vancomycin 1000mg  IV given in ED; will give another 500mg  now then 750mg  after each HD.  Goal pre-HD level 15-20 mcg/mL.  Height: 5\' 11"  (180.3 cm) Weight: 160 lb (72.6 kg) IBW/kg (Calculated) : 75.3  Temp (24hrs), Avg:103.1 F (39.5 C), Min:103.1 F (39.5 C), Max:103.1 F (39.5 C)   Recent Labs Lab 02/06/17 1455 02/06/17 1513 02/06/17 1954  WBC 10.2  --   --   CREATININE 5.22*  --   --   LATICACIDVEN  --  3.05* 2.64*    Estimated Creatinine Clearance: 15.8 mL/min (A) (by C-G formula based on SCr of 5.22 mg/dL (H)).    Allergies  Allergen Reactions  . No Known Allergies      Thank you for allowing pharmacy to be a part of this patient's care.  Wynona Neat, PharmD, BCPS  02/07/2017 12:28 AM

## 2017-02-07 NOTE — Progress Notes (Signed)
CBG=356,lantus given as scheduled. MD text paged for any additional coverage. Russell Price, Wonda Cheng, Therapist, sports

## 2017-02-07 NOTE — Progress Notes (Signed)
New Admission Note:   Arrival Method: Arrived from ED via stretcher Mental Orientation: Alert and oriented x4 Telemetry: Box #[id number] Assessment: Completed Skin: Intact IV: R FA Pain: Denies Tubes: N/A Safety Measures: Safety Fall Prevention Plan has been given, discussed and signed Admission: Completed [address] [place] Orientation: Patient has been orientated to the room, unit and staff.  Family: Wife at bedside  Orders have been reviewed and implemented. Will continue to monitor the patient. Call light has been placed within reach and bed alarm has been activated.   [employer], RN-BC Phone number: [phone]

## 2017-02-07 NOTE — Progress Notes (Signed)
[facility] Progress Note  Subjective: tmax 103 last night.  Pt feeling fine today, no c/o, getting HD now.  BP's good. Afeb now. BCx's pend.   Vitals:   02/07/17 0815 02/07/17 0845 02/07/17 0915 02/07/17 0945  BP: 124/62 117/71 120/61 (!) 111/58  Pulse: 65 66 68 70  Resp: 17 18 19 18   Temp:      TempSrc:      SpO2:      Weight:      Height:        Inpatient medications: . aspirin  81 mg Oral Daily  . atorvastatin  40 mg Oral q1800  . calcitRIOL  0.25 mcg Oral Daily  . carvedilol  12.5 mg Oral BID WC  . [START ON 02/08/2017] Darbepoetin Alfa  150 mcg Intravenous Q Sat-HD  . [START ON 02/08/2017] doxercalciferol  1 mcg Intravenous Q T,Th,Sa-HD  . doxercalciferol  1 mcg Intravenous Once in dialysis  . feeding supplement (NEPRO CARB STEADY)  237 mL Oral BID BM  . gabapentin  300 mg Oral Daily  . heparin  5,000 Units Subcutaneous Q8H  . hydrocortisone   Rectal BID  . insulin aspart  0-9 Units Subcutaneous TID WC  . insulin glargine  15 Units Subcutaneous QHS  . megestrol  40 mg Oral Daily  . multivitamin  1 tablet Oral Daily  . polyethylene glycol  17 g Oral Daily   . sodium chloride    . sodium chloride    . sodium chloride Stopped (02/07/17 0214)  . cefTAZidime (FORTAZ)  IV Stopped (02/06/17 1730)  . dextrose 5 % and 0.45% NaCl Stopped (02/07/17 0121)  . vancomycin     sodium chloride, sodium chloride, acetaminophen **OR** acetaminophen, alteplase, dextrose, heparin, lidocaine (PF), lidocaine-prilocaine, ondansetron **OR** ondansetron (ZOFRAN) IV, pentafluoroprop-tetrafluoroeth  Exam: Aler,t no distress No jvd Chest clear bilat RRR no rmg aBd soft ntnd no mass no ascites Ext no edema R chest- bandage at site of old [facility] L chest - new TDC in place, clean exit LUA AVF, +bruit, no drainage/ erythema  Dialysis: TTS NW  4h 70min   64kg   Hep 2200   Left IJ cath (L AVF non stickable after 9/10 procedure)      Impression: 1. High fevers - hx recent  enterobacter bacteremia prompting IV abx started same day (9/11); HD cath infection suspected thus pt then went for R TDC removal and L IJ replacement on 9/20.  Had new rigors during 9/20 procedure so sent to hospital for admission. On vanc/ fortaz now, repeat cx's pending from 9/20 and 9/21.   2. ESRD -  TTS HD.  HD today off sched for yesterday 3. Hypertension/volume  - BP/vol controlled on coreg 12.5 bid- BP stable - no need for fluid bolus. Have  4. Anemia  - hgb 11.7 not on ESA or Fe 5. Metabolic bone disease -  Continue hectorol - on renvela 2 ac - 6. Nutrition - will need renal carb mod diet/multi vitamin 7. DM - elevated blood sugar - per primary - likely cause of low Na (last A1c 7.1 12/2016)  Plan -  HD today, await cultures, IV abx, follow temp curve   Kelly Splinter MD [place] pager [phone]   02/07/2017, 11:04 AM    Recent Labs Lab 02/06/17 1455 02/07/17 0501  NA 129* 131*  K 4.7 4.2  CL 89* 93*  CO2 26 26  GLUCOSE 494* 192*  BUN 63* 71*  CREATININE 5.22* 5.72*  CALCIUM 8.5* 8.7*    Recent Labs Lab 02/06/17 1455 02/07/17 0501  AST 45* 41  ALT 60 58  ALKPHOS 153* 145*  BILITOT 0.7 0.7  PROT 6.9 7.0  ALBUMIN 3.0* 2.9*    Recent Labs Lab 02/06/17 1455 02/07/17 0501  WBC 10.2 12.9*  NEUTROABS 9.3* 10.0*  HGB 11.7* 11.3*  HCT 33.2* 32.4*  MCV 89.7 90.3  PLT 168 155   Iron/TIBC/Ferritin/ %Sat    Component Value Date/Time   IRON 81 11/07/2016 1056   TIBC 186 (L) 11/07/2016 1056   FERRITIN 1,461 (H) 11/07/2016 1056   IRONPCTSAT 44 (H) 11/07/2016 1056

## 2017-02-07 NOTE — Progress Notes (Addendum)
Pharmacy Antibiotic Note  Russell Price is a 56 y.o. male admitted on 02/06/2017 with sepsis.  The patient was noted to have a recent Enterobacter bacteremia from blood culture drawn on 9/11 at the HD center. The patient has been on [place] since 9/11. A new TDC was placed on 9/20 and the patient developed fevers and was admitted for more broad coverage and further evaluation. Pharmacy is on board for Vancomycin and Ceftazidime dosing.   The patient normally receives HD on TTS however missed HD on 9/20 and was dialyzed off-schedule earlier today. Will give a maintenance dose for the patient's dialysis session done today and will follow-up plans for additional HD sessions vs holding and removing the [facility].  The patient's cultures were reviewed at the HD center and were mostly sensitive except R-Cefazolin. Cefepime and carbapenems have better stability when used monotherapy for Enterobacter - so will transition the patient to Cefepime per discussion with Dr. Ree Kida.  Plan: 1. Vancomycin 750 mg x 1 dose today (HD-RN missed dose in HD so to be given on the floor) 2. No standing Vancomycin - will follow-up HD schedule 3. Change to Cefepime 1g IV every 24 hours 4. Will continue to follow HD schedule/duration, culture results, LOT, and antibiotic de-escalation plans   Height: 5\' 11"  (180.3 cm) Weight: 148 lb 13 oz (67.5 kg) IBW/kg (Calculated) : 75.3  Temp (24hrs), Avg:99.2 F (37.3 C), Min:97 F (36.1 C), Max:103.1 F (39.5 C)   Recent Labs Lab 02/06/17 1455 02/06/17 1513 02/06/17 1954 02/07/17 0501 02/07/17 0616  WBC 10.2  --   --  12.9*  --   CREATININE 5.22*  --   --  5.72*  --   LATICACIDVEN  --  3.05* 2.64* 1.2 1.0    Estimated Creatinine Clearance: 13.4 mL/min (A) (by C-G formula based on SCr of 5.72 mg/dL (H)).    Allergies  Allergen Reactions  . No Known Allergies     Fortaz PTA 9/11 >> 9/21 Vancomycin 9/20 >> Cefepime 9/21 >>  9/11 BCx at HD center >> Enterobacter (R-  Cefazolin, S-Cefepime/Ceftaz/CTX/Azactam/Ertapenem/Gent/LVQ/Mero/Zosyn)  9/21 BCx >> 9/21 MRSA PCR >> negative  Thank you for allowing pharmacy to be a part of this patient's care.  Alycia Rossetti, PharmD, BCPS Clinical Pharmacist Pager: [phone] Clinical phone for 02/07/2017 from 7a-3:30p: [phone] If after 3:30p, please call main pharmacy at: [phone] 02/07/2017 2:30 PM

## 2017-02-07 NOTE — Progress Notes (Signed)
PROGRESS NOTE    Russell Price  IWL:[id number] DOB: June 16, 1960 DOA: 02/06/2017 PCP: Arnoldo Morale, MD   Chief Complaint  Patient presents with  . Fever    Brief Narrative:  HPI on 02/07/2015  Russell Price is a 56 y.o. male with a medical history of end-stage renal disease recently started hemodialysis, essential hypertension, coronary artery disease, diabetes, who presented to the emergency department for fever. Patient had presented to have a catheter removed and replaced given recent Enterobacter bacteremia found earlier this month. Patient has been using [place] during HD days. Patient spiked a fever of 104F after dialysis catheter placement, he was then sent to the emergency department for further workup. He also experienced chills/rigors. Currently patient denies any chest pain, shortness of breath, abdominal pain, nausea vomiting, diarrhea or constipation, changes in urinary or bowel habits, recent travel or ill contacts. Assessment & Plan   Sepsis secondary to recent Enterobacter bacteremia/line infection -Patient has been getting Fortaz 2 g IV with hemodialysis since 01/28/2017 -s/p catheter removal and replacement, however spiked a fever of 104F on day of admission -Patient currently still febrile, with tachycardia and tachypnea, elevated lactic acid -Blood cultures pending -Chest x-ray unremarkable for infection -Continue vancomycin and fortaz -Will obtain echocardiogram  ESRD -Patient dialyzes Tuesday, Thursday, Saturday -Nephrology consulted and appreciated -Dialyzing today  Essential hypertension -Continue Coreg  Anemia of chronic disease -Hemoglobin currently 11.3, continue to monitor CBC  Diabetes mellitus, type II -Patient currently on Lantus and insulin sliding scale at home -Will continue Lantus, with sensitive insulin sliding scale during hospitalization and CBG monitoring -Hemoglobin A1c April 16,018 was 7.4  Coronary artery disease -Currently  chest pain-free -Continue aspirin, statin, Coreg  Hyponatremia -Appears chronic. Sodium currently 131 however has been low in June 2018. -Suspect sodium will improve with dialysis  -Continue to monitor BMP  DVT Prophylaxis  Heparin  Code Status: Full  Family Communication: None at bedside  Disposition Plan: Admitted. Home when stable  Consultants Nephrology   Procedures  None  Antibiotics   Anti-infectives    Start     Dose/Rate Route Frequency Ordered Stop   02/07/17 1200  vancomycin (VANCOCIN) IVPB 750 mg/150 ml premix     750 mg 150 mL/hr over 60 Minutes Intravenous Every Fri (Hemodialysis) 02/07/17 0850 02/14/17 1159   02/07/17 0415  vancomycin (VANCOCIN) IVPB 1000 mg/200 mL premix  Status:  Discontinued     1,000 mg 200 mL/hr over 60 Minutes Intravenous  Once 02/07/17 0403 02/07/17 0406   02/07/17 0045  vancomycin (VANCOCIN) 500 mg in sodium chloride 0.9 % 100 mL IVPB     500 mg 100 mL/hr over 60 Minutes Intravenous NOW 02/07/17 0034 02/07/17 0214   02/06/17 1700  cefTAZidime (FORTAZ) 1 g in dextrose 5 % 50 mL IVPB     1 g 100 mL/hr over 30 Minutes Intravenous Every 24 hours 02/06/17 1623     02/06/17 1630  vancomycin (VANCOCIN) IVPB 1000 mg/200 mL premix     1,000 mg 200 mL/hr over 60 Minutes Intravenous  Once 02/06/17 1615 02/06/17 1830   02/06/17 1630  cefTAZidime (FORTAZ) 2 g in dextrose 5 % 50 mL IVPB  Status:  Discontinued     2 g 100 mL/hr over 30 Minutes Intravenous  Once 02/06/17 1615 02/06/17 1623      Subjective:   Russell Price seen and examined today in hemodialysis. Denies pain, chest pain, abdominal pain, N/V/D/C, shortness of breath, headache, dizziness.   Objective:   Vitals:  02/07/17 0815 02/07/17 0845 02/07/17 0915 02/07/17 0945  BP: 124/62 117/71 120/61 (!) 111/58  Pulse: 65 66 68 70  Resp: 17 18 19 18   Temp:      TempSrc:      SpO2:      Weight:      Height:        Intake/Output Summary (Last 24 hours) at 02/07/17  1202 Last data filed at 02/07/17 1037  Gross per 24 hour  Intake           469.61 ml  Output                0 ml  Net           469.61 ml   Filed Weights   02/06/17 1436 02/07/17 0403 02/07/17 0745  Weight: 72.6 kg (160 lb) 66.6 kg (146 lb 12.8 oz) 67.5 kg (148 lb 13 oz)    Exam  General: Well developed, well nourished, NAD, appears stated age  HEENT: NCAT, mucous membranes moist.   Cardiovascular: S1 S2 auscultated, no rubs, murmurs or gallops. Regular rate and rhythm.   Respiratory: Clear to auscultation bilaterally with equal chest rise  Abdomen: Soft, nontender, nondistended, + bowel sounds  Extremities: warm dry without cyanosis clubbing or edema. LUA AVF +B/T  Neuro: AAOx3, nonfocal  Psych: Appropriate mood and affect   Data Reviewed: I have personally reviewed following labs and imaging studies  CBC:  Recent Labs Lab 02/06/17 1455 02/07/17 0501  WBC 10.2 12.9*  NEUTROABS 9.3* 10.0*  HGB 11.7* 11.3*  HCT 33.2* 32.4*  MCV 89.7 90.3  PLT 168 948   Basic Metabolic Panel:  Recent Labs Lab 02/06/17 1455 02/07/17 0501  NA 129* 131*  K 4.7 4.2  CL 89* 93*  CO2 26 26  GLUCOSE 494* 192*  BUN 63* 71*  CREATININE 5.22* 5.72*  CALCIUM 8.5* 8.7*   GFR: Estimated Creatinine Clearance: 13.4 mL/min (A) (by C-G formula based on SCr of 5.72 mg/dL (H)). Liver Function Tests:  Recent Labs Lab 02/06/17 1455 02/07/17 0501  AST 45* 41  ALT 60 58  ALKPHOS 153* 145*  BILITOT 0.7 0.7  PROT 6.9 7.0  ALBUMIN 3.0* 2.9*   No results for input(s): LIPASE, AMYLASE in the last 168 hours. No results for input(s): AMMONIA in the last 168 hours. Coagulation Profile:  Recent Labs Lab 02/07/17 0501  INR 1.09   Cardiac Enzymes: No results for input(s): CKTOTAL, CKMB, CKMBINDEX, TROPONINI in the last 168 hours. BNP (last 3 results) No results for input(s): PROBNP in the last 8760 hours. HbA1C: No results for input(s): HGBA1C in the last 72  hours. CBG:  Recent Labs Lab 02/06/17 2036 02/06/17 2231 02/07/17 0004 02/07/17 0111 02/07/17 0412  GLUCAP 388* 206* 118* 100* 169*   Lipid Profile: No results for input(s): CHOL, HDL, LDLCALC, TRIG, CHOLHDL, LDLDIRECT in the last 72 hours. Thyroid Function Tests: No results for input(s): TSH, T4TOTAL, FREET4, T3FREE, THYROIDAB in the last 72 hours. Anemia Panel: No results for input(s): VITAMINB12, FOLATE, FERRITIN, TIBC, IRON, RETICCTPCT in the last 72 hours. Urine analysis:    Component Value Date/Time   COLORURINE AMBER (A) 11/12/2016 0719   APPEARANCEUR CLOUDY (A) 11/12/2016 0719   LABSPEC 1.015 11/12/2016 0719   PHURINE 5.0 11/12/2016 0719   GLUCOSEU 50 (A) 11/12/2016 0719   HGBUR SMALL (A) 11/12/2016 0719   BILIRUBINUR NEGATIVE 11/12/2016 0719   BILIRUBINUR negative 02/27/2015 1458   BILIRUBINUR neg 02/22/2013 1737   KETONESUR  NEGATIVE 11/12/2016 0719   PROTEINUR 100 (A) 11/12/2016 0719   UROBILINOGEN 0.2 02/28/2015 0032   NITRITE NEGATIVE 11/12/2016 0719   LEUKOCYTESUR SMALL (A) 11/12/2016 0719   Sepsis Labs: @LABRCNTIP (procalcitonin:4,lacticidven:4)  ) Recent Results (from the past 240 hour(s))  MRSA PCR Screening     Status: None   Collection Time: 02/07/17  4:36 AM  Result Value Ref Range Status   MRSA by PCR NEGATIVE NEGATIVE Final    Comment:        The GeneXpert MRSA Assay (FDA approved for NASAL specimens only), is one component of a comprehensive MRSA colonization surveillance program. It is not intended to diagnose MRSA infection nor to guide or monitor treatment for MRSA infections.       Radiology Studies: Dg Chest 2 View  Result Date: 02/06/2017 CLINICAL DATA:  Port infection. EXAM: CHEST  2 VIEW COMPARISON:  Radiograph November 12, 2016. FINDINGS: Stable cardiomediastinal silhouette. Right internal jugular dialysis catheter has been removed. Interval placement of left internal jugular dialysis catheter with distal tip in right atrium. No  pneumothorax or pleural effusion is noted. No acute pulmonary disease is noted. Bony thorax is unremarkable. IMPRESSION: Interval placement of left internal jugular dialysis catheter with distal tip in right atrium. No acute cardiopulmonary abnormality seen. Electronically Signed   By: Marijo Conception, M.D.   On: 02/06/2017 15:38     Scheduled Meds: . aspirin  81 mg Oral Daily  . atorvastatin  40 mg Oral q1800  . calcitRIOL  0.25 mcg Oral Daily  . carvedilol  12.5 mg Oral BID WC  . [START ON 02/08/2017] Darbepoetin Alfa  150 mcg Intravenous Q Sat-HD  . [START ON 02/08/2017] doxercalciferol  1 mcg Intravenous Q T,Th,Sa-HD  . doxercalciferol  1 mcg Intravenous Once in dialysis  . feeding supplement (NEPRO CARB STEADY)  237 mL Oral BID BM  . gabapentin  300 mg Oral Daily  . heparin  5,000 Units Subcutaneous Q8H  . hydrocortisone   Rectal BID  . insulin aspart  0-9 Units Subcutaneous TID WC  . insulin glargine  15 Units Subcutaneous QHS  . megestrol  40 mg Oral Daily  . multivitamin  1 tablet Oral Daily  . polyethylene glycol  17 g Oral Daily   Continuous Infusions: . sodium chloride    . sodium chloride    . sodium chloride Stopped (02/07/17 0214)  . cefTAZidime (FORTAZ)  IV Stopped (02/06/17 1730)  . dextrose 5 % and 0.45% NaCl Stopped (02/07/17 0121)  . vancomycin       LOS: 1 day   Time Spent in minutes   30 minutes  Russell Price D.O. on 02/07/2017 at 12:02 PM  Between 7am to 7pm - Pager - [phone]  After 7pm go to [email or web address] - password [email or web address]  And look for the night coverage person covering for me after hours  [facility] Office  [phone]

## 2017-02-07 NOTE — Progress Notes (Signed)
Received report from ED RN. Room ready for patient. 

## 2017-02-08 DIAGNOSIS — Z992 Dependence on renal dialysis: Secondary | ICD-10-CM

## 2017-02-08 LAB — BASIC METABOLIC PANEL
Anion gap: 10 (ref 5–15)
BUN: 43 mg/dL — AB (ref 6–20)
CO2: 27 mmol/L (ref 22–32)
Calcium: 8.5 mg/dL — ABNORMAL LOW (ref 8.9–10.3)
Chloride: 91 mmol/L — ABNORMAL LOW (ref 101–111)
Creatinine, Ser: 4.39 mg/dL — ABNORMAL HIGH (ref 0.61–1.24)
GFR calc Af Amer: 16 mL/min — ABNORMAL LOW (ref >60)
GFR, EST NON AFRICAN AMERICAN: 14 mL/min — AB (ref >60)
GLUCOSE: 330 mg/dL — AB (ref 65–99)
POTASSIUM: 3.9 mmol/L (ref 3.5–5.1)
Sodium: 128 mmol/L — ABNORMAL LOW (ref 135–145)

## 2017-02-08 LAB — CBC
HCT: 31 % — ABNORMAL LOW (ref 39.0–52.0)
Hemoglobin: 10.7 g/dL — ABNORMAL LOW (ref 13.0–17.0)
MCH: 31 pg (ref 26.0–34.0)
MCHC: 34.5 g/dL (ref 30.0–36.0)
MCV: 89.9 fL (ref 78.0–100.0)
PLATELETS: 137 10*3/uL — AB (ref 150–400)
RBC: 3.45 MIL/uL — AB (ref 4.22–5.81)
RDW: 13.8 % (ref 11.5–15.5)
WBC: 7.1 10*3/uL (ref 4.0–10.5)

## 2017-02-08 LAB — GLUCOSE, CAPILLARY
Glucose-Capillary: 331 mg/dL — ABNORMAL HIGH (ref 65–99)
Glucose-Capillary: 322 mg/dL — ABNORMAL HIGH (ref 65–99)

## 2017-02-08 LAB — HEPATITIS B CORE ANTIBODY, TOTAL: Hep B Core Total Ab: NEGATIVE

## 2017-02-08 LAB — HEPATITIS B SURFACE ANTIBODY,QUALITATIVE: Hep B S Ab: NONREACTIVE

## 2017-02-08 MED ORDER — CEFEPIME HCL 1 G IJ SOLR: 1.0000 g | INTRAMUSCULAR | Status: DC

## 2017-02-08 MED ORDER — VANCOMYCIN HCL 500 MG IV SOLR
500.0000 mg | Freq: Once | INTRAVENOUS | Status: DC
  Filled 2017-02-08: qty 500

## 2017-02-08 NOTE — Progress Notes (Signed)
[facility] Progress Note   Dialysis Orders: NWF TTS 4.25 hr EDW 64 w K 2.25 Ca heparin 2200 Hecotorol L AVF and new left IJ - 9/20 - right IJ removed 9/20   Assessment/Plan: 1. Enterobacter bacteremia 01/28/17 has been on Fortaz 2 gm IV q HD since that date with fever spike 9/20 during catheter exchange; Procalcitonin 138 upon admission - WBC 12.9 down to 7.1 - recultured 9/20 and 9/21- Vanc added; no growth so far; [place] changed to Cefepime and primary would like to continue at d/c and stop Vanc; Echo negative for vegetations 2. ESRD -  TTS - Had HD Friday off schedule and plan again today for 3 hr back on schedule; using new left IJ - left AVF functionality to be addressed as an outpatient 3. Hypertension/volume  - BP/vol controlled on coreg 12.5 bid- BP stable -post HD wt 63.5 Friday 4. Anemia  - hgb 10.7  not on ESA or Fe- follow 5. Metabolic bone disease -  Continue hectorol - on renvela 2 ac - 6. Nutrition - renal carb mod diet/multi vitamin 7. DM - per primary - (last A1c 7.1 12/2016) 8. Disp - anticipate discharge today after HD  Myriam Jacobson, PA-C [place] [phone] 02/08/2017,10:24 AM  LOS: 2 days   Pt seen, examined and agree w A/P as above. Doing much better, we suspect that the rigors he had one cath replacement were related to removal of the infected catheter.  Blood cx's here are all negative, no indication to remove the current catheter and pt ok for dc home after HD today.  Kelly Splinter MD [facility] pager [phone]   02/08/2017, 12:15 PM    Subjective:   No c/o feels better  Objective Vitals:   02/07/17 1200 02/07/17 1359 02/07/17 2118 02/08/17 0539  BP: 130/62 106/74 (!) 126/54 (!) 126/51  Pulse: 66 84 64   Resp: 17 17 18 17   Temp: 97.8 F (36.6 C) 98.8 F (37.1 C) 98.7 F (37.1 C) 98.2 F (36.8 C)  TempSrc: Oral Oral Oral Oral  SpO2: 99% 100% 100% 97%  Weight: 63.7 kg (140 lb 6.9 oz)  63.5  kg (139 lb 15.9 oz)   Height:       Physical Exam General: comfortable  Heart: RRR Lungs: no rales Abdomen: soft NT Extremities: no LE edema Dialysis Access:  Old right IJ site - small hematoma - left IJ left upper AVF + bruit   Additional Objective Labs: Basic Metabolic Panel:  Recent Labs Lab 02/06/17 1455 02/07/17 0501 02/08/17 0756  NA 129* 131* 128*  K 4.7 4.2 3.9  CL 89* 93* 91*  CO2 26 26 27   GLUCOSE 494* 192* 330*  BUN 63* 71* 43*  CREATININE 5.22* 5.72* 4.39*  CALCIUM 8.5* 8.7* 8.5*   Liver Function Tests:  Recent Labs Lab 02/06/17 1455 02/07/17 0501  AST 45* 41  ALT 60 58  ALKPHOS 153* 145*  BILITOT 0.7 0.7  PROT 6.9 7.0  ALBUMIN 3.0* 2.9*   No results for input(s): LIPASE, AMYLASE in the last 168 hours. CBC:  Recent Labs Lab 02/06/17 1455 02/07/17 0501 02/08/17 0756  WBC 10.2 12.9* 7.1  NEUTROABS 9.3* 10.0*  --   HGB 11.7* 11.3* 10.7*  HCT 33.2* 32.4* 31.0*  MCV 89.7 90.3 89.9  PLT 168 155 137*   Blood Culture    Component Value Date/Time   SDES BLOOD RIGHT WRIST 02/06/2017 1620   SPECREQUEST IN PEDIATRIC BOTTLE Blood  Culture adequate volume 02/06/2017 1620   CULT NO GROWTH < 24 HOURS 02/06/2017 1620   REPTSTATUS PENDING 02/06/2017 1620    Cardiac Enzymes: No results for input(s): CKTOTAL, CKMB, CKMBINDEX, TROPONINI in the last 168 hours. CBG:  Recent Labs Lab 02/07/17 0412 02/07/17 1355 02/07/17 1658 02/07/17 2116 02/08/17 0812  GLUCAP 169* 163* 354* 358* 331*   Iron Studies: No results for input(s): IRON, TIBC, TRANSFERRIN, FERRITIN in the last 72 hours. Lab Results  Component Value Date   INR 1.09 02/07/2017   INR 1.0 03/01/2016   INR 1.29 09/03/2015   Studies/Results: Dg Chest 2 View  Result Date: 02/06/2017 CLINICAL DATA:  Port infection. EXAM: CHEST  2 VIEW COMPARISON:  Radiograph November 12, 2016. FINDINGS: Stable cardiomediastinal silhouette. Right internal jugular dialysis catheter has been removed. Interval  placement of left internal jugular dialysis catheter with distal tip in right atrium. No pneumothorax or pleural effusion is noted. No acute pulmonary disease is noted. Bony thorax is unremarkable. IMPRESSION: Interval placement of left internal jugular dialysis catheter with distal tip in right atrium. No acute cardiopulmonary abnormality seen. Electronically Signed   By: Marijo Conception, M.D.   On: 02/06/2017 15:38   Medications: . sodium chloride Stopped (02/07/17 0214)  . ceFEPime (MAXIPIME) IV Stopped (02/07/17 2118)  . dextrose 5 % and 0.45% NaCl Stopped (02/07/17 0121)   . aspirin  81 mg Oral Daily  . atorvastatin  40 mg Oral q1800  . calcitRIOL  0.25 mcg Oral Daily  . carvedilol  12.5 mg Oral BID WC  . doxercalciferol  1 mcg Intravenous Q T,Th,Sa-HD  . doxercalciferol  1 mcg Intravenous Once in dialysis  . feeding supplement (NEPRO CARB STEADY)  237 mL Oral BID BM  . gabapentin  300 mg Oral Daily  . heparin  5,000 Units Subcutaneous Q8H  . hydrocortisone   Rectal BID  . insulin aspart  0-9 Units Subcutaneous TID WC  . insulin glargine  15 Units Subcutaneous QHS  . megestrol  40 mg Oral Daily  . multivitamin  1 tablet Oral QHS  . polyethylene glycol  17 g Oral Daily

## 2017-02-08 NOTE — Progress Notes (Signed)
Pharmacy Antibiotic Note Russell Price is a 56 y.o. male admitted on 02/06/2017 with sepsis. The patient was noted to have a recent Enterobacter bacteremia from blood culture drawn on 9/11 at the HD center. The patient has been on [place] since 9/11. A new TDC was placed on 9/20 and the patient developed fevers and was admitted for more broad coverage and further evaluation. Pharmacy following for cefepime and vancomycin.   S/p IHD on 9/21 and to resume normal schedule of TTS on 9/22.   The patient's cultures were reviewed at the HD center and were mostly sensitive except R-Cefazolin. Cefepime and carbapenems have better stability when used monotherapy for Enterobacter - so will transition the patient to Cefepime per discussion with Dr. Ree Kida.  Plan: 1. Continue Cefepime 1g IV every 24 hours for Enterobacter bacteremia while inpatient  2. Vancomycin 500 mg IV post IHD today; if vancomycin continued will plan to schedule vancomycin 750 mg IV with further IHD sessions  3. ? If need to continue vancomycin based on present culture data   Height: 5\' 11"  (180.3 cm) Weight: 139 lb 15.9 oz (63.5 kg) IBW/kg (Calculated) : 75.3  Temp (24hrs), Avg:98.3 F (36.8 C), Min:97.8 F (36.6 C), Max:98.8 F (37.1 C)   Recent Labs Lab 02/06/17 1455 02/06/17 1513 02/06/17 1954 02/07/17 0501 02/07/17 0616 02/08/17 0756  WBC 10.2  --   --  12.9*  --  7.1  CREATININE 5.22*  --   --  5.72*  --  4.39*  LATICACIDVEN  --  3.05* 2.64* 1.2 1.0  --     Estimated Creatinine Clearance: 16.5 mL/min (A) (by C-G formula based on SCr of 4.39 mg/dL (H)).    Allergies  Allergen Reactions  . No Known Allergies    Antibiotics:  Tressie Ellis PTA 9/11 >> 9/21  Vancomycin 9/20 >>  Cefepime 9/21 >>  Culture data:  9/21 MRSA PCR >> negative 9/21 BCx >> ngtd 9/20 BCx >> ngtd  9/11 BCx at HD center >> Enterobacter (R- Cefazolin, S-Cefepime/Ceftaz/CTX/Azactam/Ertapenem/Gent/LVQ/Mero/Zosyn)  Thank you for allowing  pharmacy to be a part of this patient's care.  Vincenza Hews, PharmD, BCPS 02/08/2017, 11:43 AM

## 2017-02-08 NOTE — Discharge Summary (Signed)
Physician Discharge Summary  [name] ZHG:[id number] DOB: May 31, 1960 DOA: 02/06/2017  PCP: Russell Morale, MD  Admit date: 02/06/2017 Discharge date: 02/08/2017  Time spent: 45 minutes  Recommendations for Outpatient Follow-up:  Patient will be discharged to home.  Patient will need to follow up with primary care provider within one week of discharge. Continue hemodialysis as scheduled.  Patient should continue medications as prescribed.  Patient should follow a renal/carb modified diet.   Discharge Diagnoses:  Sepsis secondary to recent Enterobacter bacteremia/line infection ESRD Essential hypertension Anemia of chronic disease Diabetes mellitus, type II Coronary artery disease Hyponatremia  Discharge Condition: Stable  Diet recommendation: Renal/carb modified  Filed Weights   02/07/17 1200 02/07/17 2118 02/08/17 1211  Weight: 63.7 kg (140 lb 6.9 oz) 63.5 kg (139 lb 15.9 oz) 65.5 kg (144 lb 6.4 oz)    History of present illness:  on 02/07/2015  Russell Price a 56 y.o.malewith a medical history of end-stage renal disease recently started hemodialysis, essential hypertension, coronary artery disease, diabetes, who presented to the emergency department for fever. Patient had presented to have a catheter removed and replaced given recent Enterobacter bacteremia found earlier this month. Patient has been using [place] during HD days. Patient spiked a fever of 104F after dialysis catheter placement, he was then sent to the emergency department for further workup. He also experienced chills/rigors. Currently patient denies any chest pain, shortness of breath, abdominal pain, nausea vomiting, diarrhea or constipation, changes in urinary or bowel habits, recent travel or ill contacts.  Hospital Course:  Sepsis secondary to recent Enterobacter bacteremia/line infection -Patient has been getting Fortaz 2 g IV with hemodialysis since 01/28/2017 -s/p catheter removal and  replacement, however spiked a fever of 104F on day of admission -Patient currently still febrile, with tachycardia and tachypnea, elevated lactic acid -Blood cultures show no growth to date -Chest x-ray unremarkable for infection -Discussed with pharmacy, placed patient on vanc and transitioned to cefepime for better Enterococcus coverage -Echocardiogram EF 55-60%, no RWMA, no defect or PFO. Trivial AR  ESRD -Patient dialyzes Tuesday, Thursday, Saturday -Nephrology consulted and appreciated -Dialyzed 9/21 and today 9/22  Essential hypertension -Continue Coreg  Anemia of chronic disease -Hemoglobin currently 10.7  Diabetes mellitus, type II -Patient currently on Lantus and insulin sliding scale at home, continue upon discharge -Hemoglobin A1c April 16,018 was 7.4  Coronary artery disease -Currently chest pain-free -Continue aspirin, statin, Coreg  Hyponatremia -Appears chronic. Sodium currently 128 however has been low in June 2018.  Procedures: None  Consultations: Nephrology   Discharge Exam: Vitals:   02/08/17 1500 02/08/17 1525  BP: (!) 122/59 140/62  Pulse: (!) 55 61  Resp: (!) 23 19  Temp:  97.9 F (36.6 C)  SpO2:  98%   Patient denies chest pain, shortness of breath, abdominal pain, nausea, vomiting, diarrhea, constipation.    General: Well developed, well nourished, NAD, appears stated age  HEENT: NCAT, mucous membranes moist.  Cardiovascular: S1 S2 auscultated, RRR, no murmurs  Respiratory: Clear to auscultation bilaterally with equal chest rise  Abdomen: Soft, nontender, nondistended, + bowel sounds  Extremities: warm dry without cyanosis clubbing or edema. LUA AVF +B/T  Neuro: AAOx3, nonfocal  Psych: Pleasant, appropriate mood and affect  Discharge Instructions Discharge Instructions    Discharge instructions    Complete by:  As directed    Patient will be discharged to home.  Patient will need to follow up with primary care  provider within one week of discharge. Continue hemodialysis as  scheduled.  Patient should continue medications as prescribed.  Patient should follow a renal/carb modified diet.     Current Discharge Medication List    START taking these medications   Details  ceFEPIme 1 g in dextrose 5 % 50 mL Inject 1 g into the vein daily. With HD      CONTINUE these medications which have NOT CHANGED   Details  aspirin 81 MG chewable tablet Chew 1 tablet (81 mg total) by mouth daily.    atorvastatin (LIPITOR) 40 MG tablet Take 1 tablet (40 mg total) by mouth daily at 6 PM. Qty: 30 tablet, Refills: 5   Associated Diagnoses: Pure hypercholesterolemia    calcitRIOL (ROCALTROL) 0.25 MCG capsule Take 0.25 mcg by mouth daily. Refills: 6    carvedilol (COREG) 12.5 MG tablet Take 12.5 mg by mouth 2 (two) times daily with a meal.    Darbepoetin Alfa (ARANESP) 150 MCG/0.3ML SOSY injection Inject 0.3 mLs (150 mcg total) into the vein every Saturday with hemodialysis. Qty: 1.68 mL, Refills: 0    furosemide (LASIX) 80 MG tablet Take 80 mg by mouth 2 (two) times daily.    gabapentin (NEURONTIN) 300 MG capsule Take 1 capsule (300 mg total) by mouth daily. Qty: 30 capsule, Refills: 5   Associated Diagnoses: Diabetic mononeuropathy associated with diabetes mellitus due to underlying condition ([place])    hydrocortisone (ANUSOL-HC) 2.5 % rectal cream Place rectally 2 (two) times daily. Qty: 30 g, Refills: 0    insulin aspart (NOVOLOG) 100 UNIT/ML injection Inject 2-10 Units into the skin 3 (three) times daily before meals. Qty: 10 mL, Refills: 5   Associated Diagnoses: DM (diabetes mellitus) with complications (HCC)    insulin glargine (LANTUS) 100 UNIT/ML injection Inject 0.15 mLs (15 Units total) into the skin at bedtime. Qty: 10 mL, Refills: 5   Associated Diagnoses: DM (diabetes mellitus) with complications (HCC)    megestrol (MEGACE) 40 MG tablet Take 1 tablet (40 mg total) by mouth daily. Qty: 30  tablet, Refills: 0    multivitamin (RENA-VIT) TABS tablet Take 1 tablet by mouth daily.    Nutritional Supplements (FEEDING SUPPLEMENT, NEPRO CARB STEADY,) LIQD Take 237 mLs by mouth 2 (two) times daily between meals. Qty: 60 Can, Refills: 0    polyethylene glycol (MIRALAX / GLYCOLAX) packet Take 17 g by mouth daily. Qty: 14 each, Refills: 0    ACCU-CHEK AVIVA PLUS test strip USE 3 TIMES DAILY BEFORE MEALS AND AT BEDTIME Qty: 100 each, Refills: 12    Blood Glucose Monitoring Suppl (ACCU-CHEK AVIVA) device Use as instructed 3 times daily before meals and at bedtime. Qty: 1 each, Refills: 0    Lancet Devices (ACCU-CHEK SOFTCLIX) lancets Use as instructed 3 times daily before meals and at bedtime Qty: 1 each, Refills: 12       Allergies  Allergen Reactions  . No Known Allergies    Follow-up Information    Russell Morale, MD. Schedule an appointment as soon as possible for a visit in 1 week(s).   Specialty:  Family Medicine Why:  Hospital follow up Contact information: [place] [address] [phone]            The results of significant diagnostics from this hospitalization (including imaging, microbiology, ancillary and laboratory) are listed below for reference.    Significant Diagnostic Studies: Dg Chest 2 View  Result Date: 02/06/2017 CLINICAL DATA:  Port infection. EXAM: CHEST  2 VIEW COMPARISON:  Radiograph November 12, 2016. FINDINGS: Stable  cardiomediastinal silhouette. Right internal jugular dialysis catheter has been removed. Interval placement of left internal jugular dialysis catheter with distal tip in right atrium. No pneumothorax or pleural effusion is noted. No acute pulmonary disease is noted. Bony thorax is unremarkable. IMPRESSION: Interval placement of left internal jugular dialysis catheter with distal tip in right atrium. No acute cardiopulmonary abnormality seen. Electronically Signed   By: Marijo Conception, M.D.   On: 02/06/2017  15:38    Microbiology: Recent Results (from the past 240 hour(s))  Blood Culture (routine x 2)     Status: None (Preliminary result)   Collection Time: 02/06/17  4:15 PM  Result Value Ref Range Status   Specimen Description BLOOD RIGHT FOREARM  Final   Special Requests   Final    BOTTLES DRAWN AEROBIC AND ANAEROBIC Blood Culture adequate volume   Culture NO GROWTH 2 DAYS  Final   Report Status PENDING  Incomplete  Blood Culture (routine x 2)     Status: None (Preliminary result)   Collection Time: 02/06/17  4:20 PM  Result Value Ref Range Status   Specimen Description BLOOD RIGHT WRIST  Final   Special Requests IN PEDIATRIC BOTTLE Blood Culture adequate volume  Final   Culture NO GROWTH 2 DAYS  Final   Report Status PENDING  Incomplete  MRSA PCR Screening     Status: None   Collection Time: 02/07/17  4:36 AM  Result Value Ref Range Status   MRSA by PCR NEGATIVE NEGATIVE Final    Comment:        The GeneXpert MRSA Assay (FDA approved for NASAL specimens only), is one component of a comprehensive MRSA colonization surveillance program. It is not intended to diagnose MRSA infection nor to guide or monitor treatment for MRSA infections.   Culture, blood (x 2)     Status: None (Preliminary result)   Collection Time: 02/07/17  5:01 AM  Result Value Ref Range Status   Specimen Description BLOOD RIGHT HAND  Final   Special Requests   Final    BOTTLES DRAWN AEROBIC AND ANAEROBIC Blood Culture adequate volume   Culture NO GROWTH 1 DAY  Final   Report Status PENDING  Incomplete  Culture, blood (x 2)     Status: None (Preliminary result)   Collection Time: 02/07/17  5:01 AM  Result Value Ref Range Status   Specimen Description BLOOD RIGHT WRIST  Final   Special Requests   Final    BOTTLES DRAWN AEROBIC AND ANAEROBIC Blood Culture adequate volume   Culture NO GROWTH 1 DAY  Final   Report Status PENDING  Incomplete     Labs: Basic Metabolic Panel:  Recent Labs Lab  02/06/17 1455 02/07/17 0501 02/08/17 0756  NA 129* 131* 128*  K 4.7 4.2 3.9  CL 89* 93* 91*  CO2 26 26 27   GLUCOSE 494* 192* 330*  BUN 63* 71* 43*  CREATININE 5.22* 5.72* 4.39*  CALCIUM 8.5* 8.7* 8.5*   Liver Function Tests:  Recent Labs Lab 02/06/17 1455 02/07/17 0501  AST 45* 41  ALT 60 58  ALKPHOS 153* 145*  BILITOT 0.7 0.7  PROT 6.9 7.0  ALBUMIN 3.0* 2.9*   No results for input(s): LIPASE, AMYLASE in the last 168 hours. No results for input(s): AMMONIA in the last 168 hours. CBC:  Recent Labs Lab 02/06/17 1455 02/07/17 0501 02/08/17 0756  WBC 10.2 12.9* 7.1  NEUTROABS 9.3* 10.0*  --   HGB 11.7* 11.3* 10.7*  HCT 33.2* 32.4*  31.0*  MCV 89.7 90.3 89.9  PLT 168 155 137*   Cardiac Enzymes: No results for input(s): CKTOTAL, CKMB, CKMBINDEX, TROPONINI in the last 168 hours. BNP: BNP (last 3 results) No results for input(s): BNP in the last 8760 hours.  ProBNP (last 3 results) No results for input(s): PROBNP in the last 8760 hours.  CBG:  Recent Labs Lab 02/07/17 1355 02/07/17 1658 02/07/17 2116 02/08/17 0812 02/08/17 1137  GLUCAP 163* 354* 358* 331* 322*       Signed:  [facility]  [facility] 02/08/2017, 3:59 PM

## 2017-02-08 NOTE — Progress Notes (Signed)
Patient discharged to home with spouse. IV removed. Telemetry removed. All discharge instructions reviewed with patient. Patient left unit via wheelchair with all belongings in tow.  Sheliah Plane RN

## 2017-02-11 DIAGNOSIS — N186 End stage renal disease: Secondary | ICD-10-CM | POA: Diagnosis not present

## 2017-02-11 DIAGNOSIS — A499 Bacterial infection, unspecified: Secondary | ICD-10-CM | POA: Diagnosis not present

## 2017-02-11 DIAGNOSIS — R509 Fever, unspecified: Secondary | ICD-10-CM | POA: Diagnosis not present

## 2017-02-11 DIAGNOSIS — N2581 Secondary hyperparathyroidism of renal origin: Secondary | ICD-10-CM | POA: Diagnosis not present

## 2017-02-11 DIAGNOSIS — Z23 Encounter for immunization: Secondary | ICD-10-CM | POA: Diagnosis not present

## 2017-02-11 DIAGNOSIS — D649 Anemia, unspecified: Secondary | ICD-10-CM | POA: Diagnosis not present

## 2017-02-11 LAB — CULTURE, BLOOD (ROUTINE X 2)
CULTURE: NO GROWTH
CULTURE: NO GROWTH
Special Requests: ADEQUATE
Special Requests: ADEQUATE

## 2017-02-12 LAB — CULTURE, BLOOD (ROUTINE X 2)
Culture: NO GROWTH
Culture: NO GROWTH
SPECIAL REQUESTS: ADEQUATE
SPECIAL REQUESTS: ADEQUATE

## 2017-02-13 DIAGNOSIS — R509 Fever, unspecified: Secondary | ICD-10-CM | POA: Diagnosis not present

## 2017-02-13 DIAGNOSIS — A499 Bacterial infection, unspecified: Secondary | ICD-10-CM | POA: Diagnosis not present

## 2017-02-13 DIAGNOSIS — Z23 Encounter for immunization: Secondary | ICD-10-CM | POA: Diagnosis not present

## 2017-02-13 DIAGNOSIS — D649 Anemia, unspecified: Secondary | ICD-10-CM | POA: Diagnosis not present

## 2017-02-13 DIAGNOSIS — N186 End stage renal disease: Secondary | ICD-10-CM | POA: Diagnosis not present

## 2017-02-13 DIAGNOSIS — N2581 Secondary hyperparathyroidism of renal origin: Secondary | ICD-10-CM | POA: Diagnosis not present

## 2017-02-15 DIAGNOSIS — D649 Anemia, unspecified: Secondary | ICD-10-CM | POA: Diagnosis not present

## 2017-02-15 DIAGNOSIS — R509 Fever, unspecified: Secondary | ICD-10-CM | POA: Diagnosis not present

## 2017-02-15 DIAGNOSIS — A499 Bacterial infection, unspecified: Secondary | ICD-10-CM | POA: Diagnosis not present

## 2017-02-15 DIAGNOSIS — Z23 Encounter for immunization: Secondary | ICD-10-CM | POA: Diagnosis not present

## 2017-02-15 DIAGNOSIS — N186 End stage renal disease: Secondary | ICD-10-CM | POA: Diagnosis not present

## 2017-02-15 DIAGNOSIS — N2581 Secondary hyperparathyroidism of renal origin: Secondary | ICD-10-CM | POA: Diagnosis not present

## 2017-02-16 DIAGNOSIS — Z992 Dependence on renal dialysis: Secondary | ICD-10-CM | POA: Diagnosis not present

## 2017-02-16 DIAGNOSIS — E1122 Type 2 diabetes mellitus with diabetic chronic kidney disease: Secondary | ICD-10-CM | POA: Diagnosis not present

## 2017-02-16 DIAGNOSIS — N186 End stage renal disease: Secondary | ICD-10-CM | POA: Diagnosis not present

## 2017-02-17 MED FILL — GABAPENTIN 300 MG CAPSULE: 300 | 30 days supply | Qty: 30 | Fill #5

## 2017-02-17 MED FILL — FUROSEMIDE 80 MG TABLET: 80 | 30 days supply | Qty: 60 | Fill #6

## 2017-02-18 DIAGNOSIS — A499 Bacterial infection, unspecified: Secondary | ICD-10-CM | POA: Diagnosis not present

## 2017-02-18 DIAGNOSIS — N2581 Secondary hyperparathyroidism of renal origin: Secondary | ICD-10-CM | POA: Diagnosis not present

## 2017-02-18 DIAGNOSIS — D649 Anemia, unspecified: Secondary | ICD-10-CM | POA: Diagnosis not present

## 2017-02-18 DIAGNOSIS — D631 Anemia in chronic kidney disease: Secondary | ICD-10-CM | POA: Diagnosis not present

## 2017-02-18 DIAGNOSIS — N186 End stage renal disease: Secondary | ICD-10-CM | POA: Diagnosis not present

## 2017-02-19 ENCOUNTER — Encounter (HOSPITAL_COMMUNITY): Payer: Self-pay | Admitting: *Deleted

## 2017-02-20 DIAGNOSIS — D649 Anemia, unspecified: Secondary | ICD-10-CM | POA: Diagnosis not present

## 2017-02-20 DIAGNOSIS — N2581 Secondary hyperparathyroidism of renal origin: Secondary | ICD-10-CM | POA: Diagnosis not present

## 2017-02-20 DIAGNOSIS — A499 Bacterial infection, unspecified: Secondary | ICD-10-CM | POA: Diagnosis not present

## 2017-02-20 DIAGNOSIS — D631 Anemia in chronic kidney disease: Secondary | ICD-10-CM | POA: Diagnosis not present

## 2017-02-20 DIAGNOSIS — N186 End stage renal disease: Secondary | ICD-10-CM | POA: Diagnosis not present

## 2017-02-22 DIAGNOSIS — N2581 Secondary hyperparathyroidism of renal origin: Secondary | ICD-10-CM | POA: Diagnosis not present

## 2017-02-22 DIAGNOSIS — D631 Anemia in chronic kidney disease: Secondary | ICD-10-CM | POA: Diagnosis not present

## 2017-02-22 DIAGNOSIS — A499 Bacterial infection, unspecified: Secondary | ICD-10-CM | POA: Diagnosis not present

## 2017-02-22 DIAGNOSIS — D649 Anemia, unspecified: Secondary | ICD-10-CM | POA: Diagnosis not present

## 2017-02-22 DIAGNOSIS — N186 End stage renal disease: Secondary | ICD-10-CM | POA: Diagnosis not present

## 2017-02-24 ENCOUNTER — Ambulatory Visit (HOSPITAL_COMMUNITY)
Admission: RE | Admit: 2017-02-24 | Discharge: 2017-02-24 | Disposition: A | Discharge status: Home / Self Care | Payer: Medicare Other | Source: Ambulatory Visit | Attending: Gastroenterology | Admitting: Gastroenterology

## 2017-02-24 ENCOUNTER — Encounter (HOSPITAL_COMMUNITY)
Admission: RE | Disposition: A | Discharge status: Home / Self Care | Payer: Self-pay | Source: Ambulatory Visit | Attending: Gastroenterology

## 2017-02-24 ENCOUNTER — Other Ambulatory Visit: Payer: Self-pay | Admitting: Family Medicine

## 2017-02-24 ENCOUNTER — Other Ambulatory Visit: Payer: Self-pay | Admitting: Gastroenterology

## 2017-02-24 DIAGNOSIS — Z1211 Encounter for screening for malignant neoplasm of colon: Secondary | ICD-10-CM | POA: Diagnosis not present

## 2017-02-24 DIAGNOSIS — Z538 Procedure and treatment not carried out for other reasons: Secondary | ICD-10-CM | POA: Diagnosis not present

## 2017-02-24 HISTORY — DX: Dependence on renal dialysis: Z99.2

## 2017-02-24 SURGERY — CANCELLED PROCEDURE

## 2017-02-24 MED FILL — ATORVASTATIN 20 MG TABLET: 20 | 30 days supply | Qty: 60 | Fill #2

## 2017-02-24 MED FILL — CALCITRIOL 0.25 MCG CAPSULE: 0.25 | 30 days supply | Qty: 30 | Fill #3

## 2017-02-24 NOTE — Progress Notes (Signed)
Patient stated he was unable to finish his prep.  Patient  stated he only drank half of the gallon jug and that his stool was dark and liquid.  Dr. Therisa Doyne notified.  Orders received and processed.  Pt. Educated and given card to reschedule with [facility].    Laverta Baltimore, RN

## 2017-02-25 DIAGNOSIS — A499 Bacterial infection, unspecified: Secondary | ICD-10-CM | POA: Diagnosis not present

## 2017-02-25 DIAGNOSIS — N186 End stage renal disease: Secondary | ICD-10-CM | POA: Diagnosis not present

## 2017-02-25 DIAGNOSIS — D649 Anemia, unspecified: Secondary | ICD-10-CM | POA: Diagnosis not present

## 2017-02-25 DIAGNOSIS — D631 Anemia in chronic kidney disease: Secondary | ICD-10-CM | POA: Diagnosis not present

## 2017-02-25 DIAGNOSIS — N2581 Secondary hyperparathyroidism of renal origin: Secondary | ICD-10-CM | POA: Diagnosis not present

## 2017-02-26 NOTE — H&P (Signed)
Procedure was cancelled as patient had not finished his prep for colonoscopy.  Ronnette Juniper, MD

## 2017-02-27 DIAGNOSIS — N186 End stage renal disease: Secondary | ICD-10-CM | POA: Diagnosis not present

## 2017-02-27 DIAGNOSIS — N2581 Secondary hyperparathyroidism of renal origin: Secondary | ICD-10-CM | POA: Diagnosis not present

## 2017-02-27 DIAGNOSIS — D649 Anemia, unspecified: Secondary | ICD-10-CM | POA: Diagnosis not present

## 2017-02-27 DIAGNOSIS — A499 Bacterial infection, unspecified: Secondary | ICD-10-CM | POA: Diagnosis not present

## 2017-02-27 DIAGNOSIS — D631 Anemia in chronic kidney disease: Secondary | ICD-10-CM | POA: Diagnosis not present

## 2017-02-27 MED FILL — !NOVOLOG 100UNITS/ML VIAL: 100/ML | 28 days supply | Qty: 10 | Fill #1

## 2017-03-01 DIAGNOSIS — D631 Anemia in chronic kidney disease: Secondary | ICD-10-CM | POA: Diagnosis not present

## 2017-03-01 DIAGNOSIS — D649 Anemia, unspecified: Secondary | ICD-10-CM | POA: Diagnosis not present

## 2017-03-01 DIAGNOSIS — A499 Bacterial infection, unspecified: Secondary | ICD-10-CM | POA: Diagnosis not present

## 2017-03-01 DIAGNOSIS — N186 End stage renal disease: Secondary | ICD-10-CM | POA: Diagnosis not present

## 2017-03-01 DIAGNOSIS — N2581 Secondary hyperparathyroidism of renal origin: Secondary | ICD-10-CM | POA: Diagnosis not present

## 2017-03-03 MED FILL — ACCU-CHEK AVIVA PLUS TEST S: 33 days supply | Qty: 100 | Fill #1

## 2017-03-04 DIAGNOSIS — N2581 Secondary hyperparathyroidism of renal origin: Secondary | ICD-10-CM | POA: Diagnosis not present

## 2017-03-04 DIAGNOSIS — A499 Bacterial infection, unspecified: Secondary | ICD-10-CM | POA: Diagnosis not present

## 2017-03-04 DIAGNOSIS — D631 Anemia in chronic kidney disease: Secondary | ICD-10-CM | POA: Diagnosis not present

## 2017-03-04 DIAGNOSIS — D649 Anemia, unspecified: Secondary | ICD-10-CM | POA: Diagnosis not present

## 2017-03-04 DIAGNOSIS — N186 End stage renal disease: Secondary | ICD-10-CM | POA: Diagnosis not present

## 2017-03-05 ENCOUNTER — Ambulatory Visit: Payer: Medicaid Other | Admitting: Vascular Surgery

## 2017-03-06 DIAGNOSIS — A499 Bacterial infection, unspecified: Secondary | ICD-10-CM | POA: Diagnosis not present

## 2017-03-06 DIAGNOSIS — D649 Anemia, unspecified: Secondary | ICD-10-CM | POA: Diagnosis not present

## 2017-03-06 DIAGNOSIS — N186 End stage renal disease: Secondary | ICD-10-CM | POA: Diagnosis not present

## 2017-03-06 DIAGNOSIS — N2581 Secondary hyperparathyroidism of renal origin: Secondary | ICD-10-CM | POA: Diagnosis not present

## 2017-03-06 DIAGNOSIS — D631 Anemia in chronic kidney disease: Secondary | ICD-10-CM | POA: Diagnosis not present

## 2017-03-08 DIAGNOSIS — D631 Anemia in chronic kidney disease: Secondary | ICD-10-CM | POA: Diagnosis not present

## 2017-03-08 DIAGNOSIS — N186 End stage renal disease: Secondary | ICD-10-CM | POA: Diagnosis not present

## 2017-03-08 DIAGNOSIS — N2581 Secondary hyperparathyroidism of renal origin: Secondary | ICD-10-CM | POA: Diagnosis not present

## 2017-03-08 DIAGNOSIS — A499 Bacterial infection, unspecified: Secondary | ICD-10-CM | POA: Diagnosis not present

## 2017-03-08 DIAGNOSIS — D649 Anemia, unspecified: Secondary | ICD-10-CM | POA: Diagnosis not present

## 2017-03-11 DIAGNOSIS — N186 End stage renal disease: Secondary | ICD-10-CM | POA: Diagnosis not present

## 2017-03-11 DIAGNOSIS — N2581 Secondary hyperparathyroidism of renal origin: Secondary | ICD-10-CM | POA: Diagnosis not present

## 2017-03-11 DIAGNOSIS — A499 Bacterial infection, unspecified: Secondary | ICD-10-CM | POA: Diagnosis not present

## 2017-03-11 DIAGNOSIS — D631 Anemia in chronic kidney disease: Secondary | ICD-10-CM | POA: Diagnosis not present

## 2017-03-11 DIAGNOSIS — D649 Anemia, unspecified: Secondary | ICD-10-CM | POA: Diagnosis not present

## 2017-03-12 DIAGNOSIS — E113512 Type 2 diabetes mellitus with proliferative diabetic retinopathy with macular edema, left eye: Secondary | ICD-10-CM | POA: Diagnosis not present

## 2017-03-13 DIAGNOSIS — D649 Anemia, unspecified: Secondary | ICD-10-CM | POA: Diagnosis not present

## 2017-03-13 DIAGNOSIS — N2581 Secondary hyperparathyroidism of renal origin: Secondary | ICD-10-CM | POA: Diagnosis not present

## 2017-03-13 DIAGNOSIS — D631 Anemia in chronic kidney disease: Secondary | ICD-10-CM | POA: Diagnosis not present

## 2017-03-13 DIAGNOSIS — N186 End stage renal disease: Secondary | ICD-10-CM | POA: Diagnosis not present

## 2017-03-13 DIAGNOSIS — A499 Bacterial infection, unspecified: Secondary | ICD-10-CM | POA: Diagnosis not present

## 2017-03-14 ENCOUNTER — Ambulatory Visit: Payer: Medicare Other | Attending: Family Medicine | Admitting: Family Medicine

## 2017-03-14 ENCOUNTER — Encounter: Payer: Self-pay | Admitting: Family Medicine

## 2017-03-14 VITALS — BP 202/96 | HR 62 | Temp 98.0°F | Ht 71.0 in | Wt 151.6 lb

## 2017-03-14 DIAGNOSIS — E7800 Pure hypercholesterolemia, unspecified: Secondary | ICD-10-CM | POA: Insufficient documentation

## 2017-03-14 DIAGNOSIS — D638 Anemia in other chronic diseases classified elsewhere: Secondary | ICD-10-CM | POA: Diagnosis not present

## 2017-03-14 DIAGNOSIS — I120 Hypertensive chronic kidney disease with stage 5 chronic kidney disease or end stage renal disease: Secondary | ICD-10-CM | POA: Diagnosis not present

## 2017-03-14 DIAGNOSIS — Z794 Long term (current) use of insulin: Secondary | ICD-10-CM | POA: Insufficient documentation

## 2017-03-14 DIAGNOSIS — Z7982 Long term (current) use of aspirin: Secondary | ICD-10-CM | POA: Diagnosis not present

## 2017-03-14 DIAGNOSIS — E118 Type 2 diabetes mellitus with unspecified complications: Secondary | ICD-10-CM

## 2017-03-14 DIAGNOSIS — E0841 Diabetes mellitus due to underlying condition with diabetic mononeuropathy: Secondary | ICD-10-CM

## 2017-03-14 DIAGNOSIS — Z992 Dependence on renal dialysis: Secondary | ICD-10-CM | POA: Insufficient documentation

## 2017-03-14 DIAGNOSIS — E1122 Type 2 diabetes mellitus with diabetic chronic kidney disease: Secondary | ICD-10-CM | POA: Insufficient documentation

## 2017-03-14 DIAGNOSIS — I10 Essential (primary) hypertension: Secondary | ICD-10-CM

## 2017-03-14 DIAGNOSIS — Z79899 Other long term (current) drug therapy: Secondary | ICD-10-CM | POA: Diagnosis not present

## 2017-03-14 DIAGNOSIS — H5461 Unqualified visual loss, right eye, normal vision left eye: Secondary | ICD-10-CM | POA: Insufficient documentation

## 2017-03-14 DIAGNOSIS — R110 Nausea: Secondary | ICD-10-CM

## 2017-03-14 DIAGNOSIS — Z87891 Personal history of nicotine dependence: Secondary | ICD-10-CM | POA: Insufficient documentation

## 2017-03-14 DIAGNOSIS — E1141 Type 2 diabetes mellitus with diabetic mononeuropathy: Secondary | ICD-10-CM | POA: Insufficient documentation

## 2017-03-14 DIAGNOSIS — I2510 Atherosclerotic heart disease of native coronary artery without angina pectoris: Secondary | ICD-10-CM | POA: Insufficient documentation

## 2017-03-14 DIAGNOSIS — H409 Unspecified glaucoma: Secondary | ICD-10-CM | POA: Insufficient documentation

## 2017-03-14 DIAGNOSIS — E119 Type 2 diabetes mellitus without complications: Secondary | ICD-10-CM

## 2017-03-14 DIAGNOSIS — Z8673 Personal history of transient ischemic attack (TIA), and cerebral infarction without residual deficits: Secondary | ICD-10-CM | POA: Insufficient documentation

## 2017-03-14 DIAGNOSIS — N186 End stage renal disease: Secondary | ICD-10-CM | POA: Diagnosis not present

## 2017-03-14 LAB — GLUCOSE, POCT (MANUAL RESULT ENTRY): POC Glucose: 277 mg/dl — AB (ref 70–99)

## 2017-03-14 LAB — POCT GLYCOSYLATED HEMOGLOBIN (HGB A1C): HEMOGLOBIN A1C: 9.1

## 2017-03-14 MED ORDER — GABAPENTIN 300 MG PO CAPS: 300.0000 mg | ORAL_CAPSULE | Freq: Every day | ORAL | 5 refills | Status: DC

## 2017-03-14 MED ORDER — ONDANSETRON HCL 4 MG PO TABS: 4.0000 mg | ORAL_TABLET | Freq: Three times a day (TID) | ORAL | 1 refills | Status: DC | PRN

## 2017-03-14 MED ORDER — INSULIN GLARGINE 100 UNIT/ML ~~LOC~~ SOLN: 15.0000 [IU] | Freq: Every day | SUBCUTANEOUS | 5 refills | Status: DC

## 2017-03-14 MED ORDER — CARVEDILOL 12.5 MG PO TABS: 12.5000 mg | ORAL_TABLET | Freq: Two times a day (BID) | ORAL | 5 refills | Status: DC

## 2017-03-14 MED ORDER — ATORVASTATIN CALCIUM 40 MG PO TABS: 40.0000 mg | ORAL_TABLET | Freq: Every day | ORAL | 5 refills | Status: DC

## 2017-03-14 MED ORDER — INSULIN ASPART 100 UNIT/ML ~~LOC~~ SOLN: SUBCUTANEOUS | 5 refills | Status: DC

## 2017-03-14 MED FILL — LANTUS 100 UNITS/ML VIAL: 100 | 28 days supply | Qty: 10 | Fill #0

## 2017-03-14 MED FILL — CARVEDILOL 12.5 MG TABLET: 12.5 | 30 days supply | Qty: 60 | Fill #0

## 2017-03-14 MED FILL — GABAPENTIN 300 MG CAPSULE: 300 | 30 days supply | Qty: 30 | Fill #0

## 2017-03-14 MED FILL — ?FUROSEMIDE 80MG TABLET: 80 | 30 days supply | Qty: 120 | Fill #0

## 2017-03-14 MED FILL — ATORVASTATIN 40 MG TABLET: 40 | 30 days supply | Qty: 30 | Fill #0

## 2017-03-14 NOTE — Progress Notes (Signed)
Subjective:  Patient ID: Russell Price, male    DOB: 1961/02/19  Age: 56 y.o. MRN: [id number]  CC: Diabetes   HPI Russell Price is a 56 y/o male with medical history of HTN, CAD, type 2 DM, CKD/ESRD (last Cr 4.39), and glaucoma. He present in clinic for 3 month follow-up for DM and HTN.  Type 2 DM: Reports adherence to medications. He takes Lantus 15 units at night and uses Novolog on sliding scale. Reports morning blood glucose levels 80-120's and afternoon glucose levels 200-300's. Today prior to arrival the patient's glucose was 400; he took 12 units of Novolog. He is currently asymptomatic.   HTN: Patient reports he has been out of his Carvedilol for 2 weeks. Denies chest pain, sob, palpitations, peripheral edema, or HA.   CKD: Patient receives dialysis (T/Th/S) of each week (last received 03/13/17). Denies sudden weight gain or fluid retention. Per wife, requesting the patient to be placed on kidney transplant list.  Nausea: Patient reports intermittent nausea during dialysis. Per wife, the patient was given a medication at dialysis to prevent him from vomiting.   Past Medical History:  Diagnosis Date  . CAD (coronary artery disease)    a. underwent cath in 02/2016 after an abnormal nuc which showed 2V CAD in RCA and OM3 with no good targets for PCI. Medical managment recommended.   . Chronic kidney disease   . Diabetes mellitus without complication ([place])    type II - followed by Dr Chalmers Cater   . ESRD (end stage renal disease) ([place])   . Glaucoma   . Hemodialysis patient [facility])    has been on dialysis since approx 10/2016   . Hypertension    followed by [facility]  . Stroke [facility])    patient denies on 02/19/2017    Past Surgical History:  Procedure Laterality Date  . A/V FISTULAGRAM Left 11/27/2016   Procedure: A/V Fistulagram;  Surgeon: Waynetta Sandy, MD;  Location: [place] [facility];  Service: Cardiovascular;  Laterality: Left;  . AV FISTULA  PLACEMENT Left 03/12/2016   Procedure: Left Arm ARTERIOVENOUS (AV) FISTULA CREATION;  Surgeon: Waynetta Sandy, MD;  Location: [place];  Service: Vascular;  Laterality: Left;  . BASCILIC VEIN TRANSPOSITION Left 05/14/2016   Procedure: SECOND STAGE LEFT BASILIC VEIN TRANSPOSITION;  Surgeon: Waynetta Sandy, MD;  Location: [place];  Service: Vascular;  Laterality: Left;  . BRONCHOSCOPY  02/14/2015   for pulm hemorrhage  . CARDIAC CATHETERIZATION N/A 03/04/2016   Procedure: Left Heart Cath and Coronary Angiography;  Surgeon: Leonie Man, MD;  Location: [place] [facility];  Service: Cardiovascular;  Laterality: N/A;  . EYE SURGERY Left   . INSERTION OF DIALYSIS CATHETER Right 11/08/2016   Procedure: INSERTION OF DIALYSIS CATHETER;  Surgeon: Rosetta Posner, MD;  Location: [place];  Service: Vascular;  Laterality: Right;  . IR PARACENTESIS  11/13/2016  . PERIPHERAL VASCULAR BALLOON ANGIOPLASTY Left 11/27/2016   Procedure: Peripheral Vascular Balloon Angioplasty;  Surgeon: Waynetta Sandy, MD;  Location: [place] [facility];  Service: Cardiovascular;  Laterality: Left;  ARM FISTULA   Patient Active Problem List   Diagnosis Date Noted  . Sepsis ([place]) 02/06/2017  . Ascites   . Hyponatremia 11/06/2016  . Fluid overload 11/06/2016  . CAD (coronary artery disease)   . Abnormal nuclear stress test 03/04/2016  . ESRD (end stage renal disease) ([place])   . Diabetic neuropathy ([place]) 10/05/2015  . Left arm weakness   .  Acute encephalopathy   . Hyperlipidemia 09/21/2015  . Labile blood pressure   . Labile blood glucose   . Diabetes mellitus type 2 in nonobese (HCC)   . Anemia of chronic disease   . Debility 09/08/2015  . Acidemia   . Seizure-like activity ([place])   . Acute kidney injury ([place])   . Cerebral thrombosis with cerebral infarction ([place]) 02/18/2015  . Essential hypertension 11/11/2014  . DM (diabetes mellitus) with complications ([place]) 22/06/5425  . Blind right eye  11/11/2014   Outpatient Medications Prior to Visit  Medication Sig Dispense Refill  . ACCU-CHEK AVIVA PLUS test strip USE 3 TIMES DAILY BEFORE MEALS AND AT BEDTIME 100 each 12  . aspirin 81 MG chewable tablet Chew 1 tablet (81 mg total) by mouth daily. (Patient taking differently: Chew 81 mg by mouth every evening. )    . Blood Glucose Monitoring Suppl (ACCU-CHEK AVIVA) device Use as instructed 3 times daily before meals and at bedtime. 1 each 0  . calcitRIOL (ROCALTROL) 0.25 MCG capsule Take 0.25 mcg by mouth daily.  6  . furosemide (LASIX) 80 MG tablet Take 80 mg by mouth 2 (two) times daily.    . insulin aspart (NOVOLOG) 100 UNIT/ML injection Inject 2-10 Units into the skin 3 (three) times daily before meals. (Patient taking differently: Inject 2-6 Units into the skin 3 (three) times daily before meals. Per sliding scale) 10 mL 5  . Lancet Devices (ACCU-CHEK SOFTCLIX) lancets Use as instructed 3 times daily before meals and at bedtime 1 each 12  . atorvastatin (LIPITOR) 40 MG tablet Take 1 tablet (40 mg total) by mouth daily at 6 PM. 30 tablet 5  . gabapentin (NEURONTIN) 300 MG capsule Take 1 capsule (300 mg total) by mouth daily. 30 capsule 5  . insulin glargine (LANTUS) 100 UNIT/ML injection Inject 0.15 mLs (15 Units total) into the skin at bedtime. 10 mL 5  . Darbepoetin Alfa (ARANESP) 150 MCG/0.3ML SOSY injection Inject 0.3 mLs (150 mcg total) into the vein every Saturday with hemodialysis. (Patient not taking: Reported on 03/14/2017) 1.68 mL 0  . hydrocortisone (ANUSOL-HC) 2.5 % rectal cream Place rectally 2 (two) times daily. (Patient not taking: Reported on 03/14/2017) 30 g 0  . multivitamin (RENA-VIT) TABS tablet Take 1 tablet by mouth daily.    . Nutritional Supplements (FEEDING SUPPLEMENT, NEPRO CARB STEADY,) LIQD Take 237 mLs by mouth 2 (two) times daily between meals. (Patient not taking: Reported on 03/14/2017) 60 Can 0  . polyethylene glycol (MIRALAX / GLYCOLAX) packet Take 17 g by  mouth daily. (Patient not taking: Reported on 03/14/2017) 14 each 0  . carvedilol (COREG) 12.5 MG tablet Take 12.5 mg by mouth 2 (two) times daily with a meal.    . ceFEPIme 1 g in dextrose 5 % 50 mL Inject 1 g into the vein daily. With HD (Patient not taking: Reported on 03/14/2017)    . megestrol (MEGACE) 40 MG tablet Take 1 tablet (40 mg total) by mouth daily. (Patient not taking: Reported on 03/14/2017) 30 tablet 0   No facility-administered medications prior to visit.    No Known Allergies  Family History  Problem Relation Age of Onset  . Diabetes Mother    Social History   Social History  . Marital status: Married    Spouse name: N/A  . Number of children: N/A  . Years of education: N/A   Occupational History  . Not on file.   Social History Main Topics  .  Smoking status: Former Smoker    Packs/day: 0.00    Years: 4.00    Quit date: 02/23/1983  . Smokeless tobacco: Never Used  . Alcohol use No  . Drug use: No  . Sexual activity: Not on file   Other Topics Concern  . Not on file   Social History Narrative  . No narrative on file   ROS See HPI Review of Systems  Constitutional: Negative for activity change, appetite change, fatigue and unexpected weight change.  Respiratory: Negative for cough, chest tightness and shortness of breath.   Cardiovascular: Negative for chest pain, palpitations and leg swelling.  Gastrointestinal: Positive for nausea (reports intermittent episodes of nausea with dialysis). Negative for abdominal pain and vomiting.  Endocrine: Negative for polydipsia, polyphagia and polyuria.  Genitourinary: Negative for decreased urine volume.  Skin: Negative for rash and wound.  Neurological: Negative for dizziness, weakness, light-headedness, numbness and headaches.  Psychiatric/Behavioral: Negative for behavioral problems, decreased concentration and sleep disturbance.  All other systems reviewed and are negative.  Objective:  BP (!) 202/96    Pulse 62   Temp 98 F (36.7 C) (Oral)   Ht 5\' 11"  (1.803 m)   Wt 151 lb 9.6 oz (68.8 kg)   SpO2 100%   BMI 21.14 kg/m   BP/Weight 03/14/2017 02/08/2017 0/92/3300  Systolic BP 762 263 335  Diastolic BP 96 62 72  Wt. (Lbs) 151.6 143.3 147.8  BMI 21.14 19.99 20.61   Lab Results  Component Value Date   HGBA1C 9.1 03/14/2017   Lab Results  Component Value Date   POCGLU 277 (A) 03/14/2017   Physical Exam  Constitutional: He is oriented to person, place, and time. He appears well-developed and well-nourished. No distress.  HENT:  Head: Normocephalic and atraumatic.  Neck: Normal range of motion. Neck supple. No JVD present.  Cardiovascular: Normal rate, regular rhythm and normal heart sounds.   Pulmonary/Chest: Effort normal and breath sounds normal.  Abdominal: Soft. Bowel sounds are normal. There is no tenderness.  Musculoskeletal: Normal range of motion. He exhibits no edema or tenderness.  Neurological: He is alert and oriented to person, place, and time. He has normal reflexes. No sensory deficit.  Skin: Skin is warm and dry. No rash noted.  Psychiatric: He has a normal mood and affect. His behavior is normal. Judgment and thought content normal.  Nursing note and vitals reviewed.  Assessment & Plan:   1. Diabetes mellitus type 2 in nonobese [facility]), uncontrolled - Patient is not taking Novolog with evening meals. Education provide on insulin regimen. - Patient informed to take Novolog each time he eats to avoid elevation in glucose levels in the afternoon.  - Further diabetic diet teaching provided.   - Will continue medication regimen and re-evaluate in 3 months.  - POCT glucose (manual entry) - POCT glycosylated hemoglobin (Hb A1C)  2. Diabetic mononeuropathy associated with diabetes mellitus due to underlying condition [facility]), controlled - Patient asymptomatic at this time. Refilled medication.  - gabapentin (NEURONTIN) 300 MG capsule; Take 1 capsule (300 mg total) by  mouth daily.  Dispense: 30 capsule; Refill: 5  3. DM (diabetes mellitus) with complications ([place]), uncontrolled  - Will continue medication regimen.  - insulin glargine (LANTUS) 100 UNIT/ML injection; Inject 0.15 mLs (15 Units total) into the skin at bedtime.  Dispense: 10 mL; Refill: 5  4. Pure hypercholesterolemia, controlled  - Will continue medication regimen.  - atorvastatin (LIPITOR) 40 MG tablet; Take 1 tablet (40 mg total) by mouth  daily at 6 PM.  Dispense: 30 tablet; Refill: 5  5. Nausea, dialysis related, episodic - Reported nausea with dialysis. Provided an antiemetic.  - Ondansetron 4 mg, prn.   6. ESRD (end stage renal disease) ([place]), uncontrolled - Patient followed and managed by Nephrology.  7. Essential Hypertension, uncontrolled - In clinic BP is 202/96 with HR of 62. Patient has not taken BP medications for 2 weeks. Unable to give clonidine in clinic due to current HR. - Patient informed to come or speak directly to provider's primary nurse if out of medications for an immediate refill. - Will continue medication regimen. Medications refilled.  - Carvedilol 12.5 mg, BID   Meds ordered this encounter  Medications  . gabapentin (NEURONTIN) 300 MG capsule    Sig: Take 1 capsule (300 mg total) by mouth daily.    Dispense:  30 capsule    Refill:  5  . carvedilol (COREG) 12.5 MG tablet    Sig: Take 1 tablet (12.5 mg total) by mouth 2 (two) times daily with a meal.    Dispense:  60 tablet    Refill:  5  . insulin glargine (LANTUS) 100 UNIT/ML injection    Sig: Inject 0.15 mLs (15 Units total) into the skin at bedtime.    Dispense:  10 mL    Refill:  5  . atorvastatin (LIPITOR) 40 MG tablet    Sig: Take 1 tablet (40 mg total) by mouth daily at 6 PM.    Dispense:  30 tablet    Refill:  5  . ondansetron (ZOFRAN) 4 MG tablet    Sig: Take 1 tablet (4 mg total) by mouth every 8 (eight) hours as needed for nausea or vomiting.    Dispense:  30 tablet    Refill:  1     Follow-up: Return in about 3 months (around 06/14/2017) for For diabetes mellitus.   Archie Endo [place]

## 2017-03-14 NOTE — Progress Notes (Signed)
Pt has not been taking BP medication.

## 2017-03-15 DIAGNOSIS — N186 End stage renal disease: Secondary | ICD-10-CM | POA: Diagnosis not present

## 2017-03-15 DIAGNOSIS — D649 Anemia, unspecified: Secondary | ICD-10-CM | POA: Diagnosis not present

## 2017-03-15 DIAGNOSIS — N2581 Secondary hyperparathyroidism of renal origin: Secondary | ICD-10-CM | POA: Diagnosis not present

## 2017-03-15 DIAGNOSIS — D631 Anemia in chronic kidney disease: Secondary | ICD-10-CM | POA: Diagnosis not present

## 2017-03-15 DIAGNOSIS — A499 Bacterial infection, unspecified: Secondary | ICD-10-CM | POA: Diagnosis not present

## 2017-03-18 DIAGNOSIS — N2581 Secondary hyperparathyroidism of renal origin: Secondary | ICD-10-CM | POA: Diagnosis not present

## 2017-03-18 DIAGNOSIS — D649 Anemia, unspecified: Secondary | ICD-10-CM | POA: Diagnosis not present

## 2017-03-18 DIAGNOSIS — A499 Bacterial infection, unspecified: Secondary | ICD-10-CM | POA: Diagnosis not present

## 2017-03-18 DIAGNOSIS — N186 End stage renal disease: Secondary | ICD-10-CM | POA: Diagnosis not present

## 2017-03-18 DIAGNOSIS — D631 Anemia in chronic kidney disease: Secondary | ICD-10-CM | POA: Diagnosis not present

## 2017-03-19 DIAGNOSIS — N186 End stage renal disease: Secondary | ICD-10-CM | POA: Diagnosis not present

## 2017-03-19 DIAGNOSIS — E1122 Type 2 diabetes mellitus with diabetic chronic kidney disease: Secondary | ICD-10-CM | POA: Diagnosis not present

## 2017-03-19 DIAGNOSIS — Z992 Dependence on renal dialysis: Secondary | ICD-10-CM | POA: Diagnosis not present

## 2017-03-20 DIAGNOSIS — Z23 Encounter for immunization: Secondary | ICD-10-CM | POA: Diagnosis not present

## 2017-03-20 DIAGNOSIS — N186 End stage renal disease: Secondary | ICD-10-CM | POA: Diagnosis not present

## 2017-03-20 DIAGNOSIS — D509 Iron deficiency anemia, unspecified: Secondary | ICD-10-CM | POA: Diagnosis not present

## 2017-03-20 DIAGNOSIS — E1129 Type 2 diabetes mellitus with other diabetic kidney complication: Secondary | ICD-10-CM | POA: Diagnosis not present

## 2017-03-20 DIAGNOSIS — D631 Anemia in chronic kidney disease: Secondary | ICD-10-CM | POA: Diagnosis not present

## 2017-03-20 DIAGNOSIS — N2581 Secondary hyperparathyroidism of renal origin: Secondary | ICD-10-CM | POA: Diagnosis not present

## 2017-03-21 MED FILL — NovoLOG 100 UNIT/ML SOLN: 100 | 27 days supply | Qty: 10 | Fill #0

## 2017-03-22 DIAGNOSIS — N2581 Secondary hyperparathyroidism of renal origin: Secondary | ICD-10-CM | POA: Diagnosis not present

## 2017-03-22 DIAGNOSIS — E1129 Type 2 diabetes mellitus with other diabetic kidney complication: Secondary | ICD-10-CM | POA: Diagnosis not present

## 2017-03-22 DIAGNOSIS — Z23 Encounter for immunization: Secondary | ICD-10-CM | POA: Diagnosis not present

## 2017-03-22 DIAGNOSIS — D509 Iron deficiency anemia, unspecified: Secondary | ICD-10-CM | POA: Diagnosis not present

## 2017-03-22 DIAGNOSIS — D631 Anemia in chronic kidney disease: Secondary | ICD-10-CM | POA: Diagnosis not present

## 2017-03-22 DIAGNOSIS — N186 End stage renal disease: Secondary | ICD-10-CM | POA: Diagnosis not present

## 2017-03-24 MED FILL — CALCITRIOL 0.25 MCG CAPSULE: 0.25 | 30 days supply | Qty: 30 | Fill #4

## 2017-03-25 DIAGNOSIS — D509 Iron deficiency anemia, unspecified: Secondary | ICD-10-CM | POA: Diagnosis not present

## 2017-03-25 DIAGNOSIS — N186 End stage renal disease: Secondary | ICD-10-CM | POA: Diagnosis not present

## 2017-03-25 DIAGNOSIS — D631 Anemia in chronic kidney disease: Secondary | ICD-10-CM | POA: Diagnosis not present

## 2017-03-25 DIAGNOSIS — Z23 Encounter for immunization: Secondary | ICD-10-CM | POA: Diagnosis not present

## 2017-03-25 DIAGNOSIS — E1129 Type 2 diabetes mellitus with other diabetic kidney complication: Secondary | ICD-10-CM | POA: Diagnosis not present

## 2017-03-25 DIAGNOSIS — N2581 Secondary hyperparathyroidism of renal origin: Secondary | ICD-10-CM | POA: Diagnosis not present

## 2017-03-27 DIAGNOSIS — N186 End stage renal disease: Secondary | ICD-10-CM | POA: Diagnosis not present

## 2017-03-27 DIAGNOSIS — Z23 Encounter for immunization: Secondary | ICD-10-CM | POA: Diagnosis not present

## 2017-03-27 DIAGNOSIS — N2581 Secondary hyperparathyroidism of renal origin: Secondary | ICD-10-CM | POA: Diagnosis not present

## 2017-03-27 DIAGNOSIS — D631 Anemia in chronic kidney disease: Secondary | ICD-10-CM | POA: Diagnosis not present

## 2017-03-27 DIAGNOSIS — E1129 Type 2 diabetes mellitus with other diabetic kidney complication: Secondary | ICD-10-CM | POA: Diagnosis not present

## 2017-03-27 DIAGNOSIS — D509 Iron deficiency anemia, unspecified: Secondary | ICD-10-CM | POA: Diagnosis not present

## 2017-03-29 DIAGNOSIS — D509 Iron deficiency anemia, unspecified: Secondary | ICD-10-CM | POA: Diagnosis not present

## 2017-03-29 DIAGNOSIS — D631 Anemia in chronic kidney disease: Secondary | ICD-10-CM | POA: Diagnosis not present

## 2017-03-29 DIAGNOSIS — N2581 Secondary hyperparathyroidism of renal origin: Secondary | ICD-10-CM | POA: Diagnosis not present

## 2017-03-29 DIAGNOSIS — N186 End stage renal disease: Secondary | ICD-10-CM | POA: Diagnosis not present

## 2017-03-29 DIAGNOSIS — Z23 Encounter for immunization: Secondary | ICD-10-CM | POA: Diagnosis not present

## 2017-03-29 DIAGNOSIS — E1129 Type 2 diabetes mellitus with other diabetic kidney complication: Secondary | ICD-10-CM | POA: Diagnosis not present

## 2017-04-01 DIAGNOSIS — N2581 Secondary hyperparathyroidism of renal origin: Secondary | ICD-10-CM | POA: Diagnosis not present

## 2017-04-01 DIAGNOSIS — D631 Anemia in chronic kidney disease: Secondary | ICD-10-CM | POA: Diagnosis not present

## 2017-04-01 DIAGNOSIS — E1129 Type 2 diabetes mellitus with other diabetic kidney complication: Secondary | ICD-10-CM | POA: Diagnosis not present

## 2017-04-01 DIAGNOSIS — D509 Iron deficiency anemia, unspecified: Secondary | ICD-10-CM | POA: Diagnosis not present

## 2017-04-01 DIAGNOSIS — Z23 Encounter for immunization: Secondary | ICD-10-CM | POA: Diagnosis not present

## 2017-04-01 DIAGNOSIS — N186 End stage renal disease: Secondary | ICD-10-CM | POA: Diagnosis not present

## 2017-04-03 DIAGNOSIS — D509 Iron deficiency anemia, unspecified: Secondary | ICD-10-CM | POA: Diagnosis not present

## 2017-04-03 DIAGNOSIS — N186 End stage renal disease: Secondary | ICD-10-CM | POA: Diagnosis not present

## 2017-04-03 DIAGNOSIS — E1129 Type 2 diabetes mellitus with other diabetic kidney complication: Secondary | ICD-10-CM | POA: Diagnosis not present

## 2017-04-03 DIAGNOSIS — Z23 Encounter for immunization: Secondary | ICD-10-CM | POA: Diagnosis not present

## 2017-04-03 DIAGNOSIS — N2581 Secondary hyperparathyroidism of renal origin: Secondary | ICD-10-CM | POA: Diagnosis not present

## 2017-04-03 DIAGNOSIS — D631 Anemia in chronic kidney disease: Secondary | ICD-10-CM | POA: Diagnosis not present

## 2017-04-05 ENCOUNTER — Encounter (HOSPITAL_COMMUNITY): Payer: Self-pay | Admitting: *Deleted

## 2017-04-05 DIAGNOSIS — D631 Anemia in chronic kidney disease: Secondary | ICD-10-CM | POA: Diagnosis not present

## 2017-04-05 DIAGNOSIS — D509 Iron deficiency anemia, unspecified: Secondary | ICD-10-CM | POA: Diagnosis not present

## 2017-04-05 DIAGNOSIS — Z23 Encounter for immunization: Secondary | ICD-10-CM | POA: Diagnosis not present

## 2017-04-05 DIAGNOSIS — N2581 Secondary hyperparathyroidism of renal origin: Secondary | ICD-10-CM | POA: Diagnosis not present

## 2017-04-05 DIAGNOSIS — N186 End stage renal disease: Secondary | ICD-10-CM | POA: Diagnosis not present

## 2017-04-05 DIAGNOSIS — E1129 Type 2 diabetes mellitus with other diabetic kidney complication: Secondary | ICD-10-CM | POA: Diagnosis not present

## 2017-04-05 NOTE — Progress Notes (Signed)
Patient states he did not have anything to write with while at dialysis center. Spoke to [employer], Therapist, sports at [employer] and give instructions to patient. She states she will give patient written copy of instructions. Provided below instructions regarding DM.  WHAT DO I DO ABOUT MY DIABETES MEDICATION?   Marland Kitchen Do not take oral diabetes medicines (pills) the morning of surgery.  . THE NIGHT BEFORE SURGERY, take ____10_______ units of __Lantus_________insulin.     . If your CBG is greater than 220 mg/dL, you may take  of your sliding scale (correction) dose of insulin.

## 2017-04-05 NOTE — Progress Notes (Signed)
Dr. Linna Caprice made aware of patient's difficult airway history.

## 2017-04-06 NOTE — Anesthesia Preprocedure Evaluation (Addendum)
Anesthesia Evaluation  Patient identified by MRN, date of birth, ID band Patient awake    Reviewed: Allergy & Precautions, NPO status , Patient's Chart, lab work & pertinent test results  History of Anesthesia Complications (+) DIFFICULT AIRWAY  Airway Mallampati: II  TM Distance: >3 FB Neck ROM: Full    Dental  (+) Dental Advisory Given, Teeth Intact   Pulmonary former smoker,    Pulmonary exam normal breath sounds clear to auscultation       Cardiovascular hypertension, Pt. on medications + CAD, + Past MI and + Peripheral Vascular Disease  Normal cardiovascular exam Rhythm:Regular Rate:Normal  LHC 02/2016 - Mid RCA lesion, 65 %stenosed. Dist RCA lesion, 85 %stenosed. Relatively small caliber vessel. Smaller diameter than 5 French catheter Prox LAD lesion, 45 %stenosed. Mid LAD lesion, 35 %stenosed. 3rd Mrg lesion, 70 %stenosed. LV end diastolic pressure is moderately elevated. MEDICAL MANAGEMENT ONLY  TTE 2018 - Mild LVH. Ejection fraction was in the range of 55% to 60%. Trivial AI. Mildly dilated LA. Prominent chiari network in dome of RA.   Neuro/Psych CVA, No Residual Symptoms negative psych ROS   GI/Hepatic negative GI ROS, Neg liver ROS,   Endo/Other  diabetes, Insulin Dependent  Renal/GU ESRF and DialysisRenal disease  negative genitourinary   Musculoskeletal negative musculoskeletal ROS (+)   Abdominal   Peds negative pediatric ROS (+)  Hematology negative hematology ROS (+) anemia , Hgb 7.6 Plt 141   Anesthesia Other Findings   Reproductive/Obstetrics negative OB ROS                           Anesthesia Physical  Anesthesia Plan  ASA: IV  Anesthesia Plan: MAC   Post-op Pain Management:    Induction: Intravenous  PONV Risk Score and Plan: Treatment may vary due to age or medical condition and Propofol infusion  Airway Management Planned: Natural Airway and Simple  Face Mask  Additional Equipment: None  Intra-op Plan:   Post-operative Plan:   Informed Consent: I have reviewed the patients History and Physical, chart, labs and discussed the procedure including the risks, benefits and alternatives for the proposed anesthesia with the patient or authorized representative who has indicated his/her understanding and acceptance.   Dental advisory given  Plan Discussed with: CRNA  Anesthesia Plan Comments:         Anesthesia Quick Evaluation

## 2017-04-07 ENCOUNTER — Encounter (HOSPITAL_COMMUNITY): Payer: Self-pay

## 2017-04-07 ENCOUNTER — Encounter (HOSPITAL_COMMUNITY)
Admission: RE | Disposition: A | Discharge status: Home / Self Care | Payer: Self-pay | Source: Ambulatory Visit | Attending: Gastroenterology

## 2017-04-07 ENCOUNTER — Ambulatory Visit (HOSPITAL_COMMUNITY): Payer: Medicare Other | Admitting: Anesthesiology

## 2017-04-07 ENCOUNTER — Ambulatory Visit (HOSPITAL_COMMUNITY)
Admission: RE | Admit: 2017-04-07 | Discharge: 2017-04-07 | Disposition: A | Discharge status: Home / Self Care | Payer: Medicare Other | Source: Ambulatory Visit | Attending: Gastroenterology | Admitting: Gastroenterology

## 2017-04-07 ENCOUNTER — Other Ambulatory Visit: Payer: Self-pay

## 2017-04-07 DIAGNOSIS — I252 Old myocardial infarction: Secondary | ICD-10-CM | POA: Diagnosis not present

## 2017-04-07 DIAGNOSIS — Z87891 Personal history of nicotine dependence: Secondary | ICD-10-CM | POA: Diagnosis not present

## 2017-04-07 DIAGNOSIS — Z992 Dependence on renal dialysis: Secondary | ICD-10-CM | POA: Diagnosis not present

## 2017-04-07 DIAGNOSIS — H5461 Unqualified visual loss, right eye, normal vision left eye: Secondary | ICD-10-CM | POA: Diagnosis not present

## 2017-04-07 DIAGNOSIS — Z794 Long term (current) use of insulin: Secondary | ICD-10-CM | POA: Diagnosis not present

## 2017-04-07 DIAGNOSIS — E785 Hyperlipidemia, unspecified: Secondary | ICD-10-CM | POA: Insufficient documentation

## 2017-04-07 DIAGNOSIS — Z7982 Long term (current) use of aspirin: Secondary | ICD-10-CM | POA: Diagnosis not present

## 2017-04-07 DIAGNOSIS — Z1211 Encounter for screening for malignant neoplasm of colon: Secondary | ICD-10-CM | POA: Insufficient documentation

## 2017-04-07 DIAGNOSIS — I120 Hypertensive chronic kidney disease with stage 5 chronic kidney disease or end stage renal disease: Secondary | ICD-10-CM | POA: Diagnosis not present

## 2017-04-07 DIAGNOSIS — Z79899 Other long term (current) drug therapy: Secondary | ICD-10-CM | POA: Insufficient documentation

## 2017-04-07 DIAGNOSIS — I2510 Atherosclerotic heart disease of native coronary artery without angina pectoris: Secondary | ICD-10-CM | POA: Diagnosis not present

## 2017-04-07 DIAGNOSIS — E1140 Type 2 diabetes mellitus with diabetic neuropathy, unspecified: Secondary | ICD-10-CM | POA: Diagnosis not present

## 2017-04-07 DIAGNOSIS — I739 Peripheral vascular disease, unspecified: Secondary | ICD-10-CM | POA: Diagnosis not present

## 2017-04-07 DIAGNOSIS — D631 Anemia in chronic kidney disease: Secondary | ICD-10-CM | POA: Diagnosis not present

## 2017-04-07 DIAGNOSIS — Z8673 Personal history of transient ischemic attack (TIA), and cerebral infarction without residual deficits: Secondary | ICD-10-CM | POA: Insufficient documentation

## 2017-04-07 DIAGNOSIS — N186 End stage renal disease: Secondary | ICD-10-CM | POA: Diagnosis not present

## 2017-04-07 DIAGNOSIS — E1122 Type 2 diabetes mellitus with diabetic chronic kidney disease: Secondary | ICD-10-CM | POA: Insufficient documentation

## 2017-04-07 DIAGNOSIS — H409 Unspecified glaucoma: Secondary | ICD-10-CM | POA: Insufficient documentation

## 2017-04-07 HISTORY — DX: Failed or difficult intubation, initial encounter: T88.4XXA

## 2017-04-07 HISTORY — PX: COLONOSCOPY WITH PROPOFOL: SHX5780

## 2017-04-07 LAB — POCT I-STAT 4, (NA,K, GLUC, HGB,HCT)
Glucose, Bld: 126 mg/dL — ABNORMAL HIGH (ref 65–99)
HEMATOCRIT: 28 % — AB (ref 39.0–52.0)
HEMOGLOBIN: 9.5 g/dL — AB (ref 13.0–17.0)
POTASSIUM: 4.6 mmol/L (ref 3.5–5.1)
SODIUM: 137 mmol/L (ref 135–145)

## 2017-04-07 LAB — GLUCOSE, CAPILLARY: GLUCOSE-CAPILLARY: 124 mg/dL — AB (ref 65–99)

## 2017-04-07 SURGERY — COLONOSCOPY WITH PROPOFOL
Anesthesia: Monitor Anesthesia Care

## 2017-04-07 MED ORDER — PROPOFOL 500 MG/50ML IV EMUL
INTRAVENOUS | Status: DC | PRN
  Administered 2017-04-07: 125 ug/kg/min via INTRAVENOUS

## 2017-04-07 MED ORDER — LIDOCAINE 2% (20 MG/ML) 5 ML SYRINGE
INTRAMUSCULAR | Status: DC | PRN
  Administered 2017-04-07: 40 mg via INTRAVENOUS

## 2017-04-07 MED ORDER — SODIUM CHLORIDE 0.9 % IV SOLN
INTRAVENOUS | Status: DC
  Administered 2017-04-07: 08:00:00 via INTRAVENOUS

## 2017-04-07 MED ORDER — SODIUM CHLORIDE 0.9 % IV SOLN: INTRAVENOUS | Status: DC

## 2017-04-07 MED ORDER — PROPOFOL 10 MG/ML IV BOLUS
INTRAVENOUS | Status: DC | PRN
  Administered 2017-04-07: 20 mg via INTRAVENOUS

## 2017-04-07 NOTE — Interval H&P Note (Signed)
History and Physical Interval Note: 56/male for initial screening colonoscopy.  04/07/2017 8:21 AM  Russell Price  has presented today for Colonoscopy, with the diagnosis of Colon cancer screening  The various methods of treatment have been discussed with the patient and family. After consideration of risks, benefits and other options for treatment, the patient has consented to  Procedure(s): COLONOSCOPY WITH PROPOFOL (N/A) as a surgical intervention .  The patient's history has been reviewed, patient examined, no change in status, stable for surgery.  I have reviewed the patient's chart and labs.  Questions were answered to the patient's satisfaction.     Ronnette Juniper

## 2017-04-07 NOTE — Discharge Instructions (Signed)
YOU HAD AN ENDOSCOPIC PROCEDURE TODAY: Refer to the procedure report and other information in the discharge instructions given to you for any specific questions about what was found during the examination. If this information does not answer your questions, please call [facility] office at [phone] to clarify.  ° °YOU SHOULD EXPECT: Some feelings of bloating in the abdomen. Passage of more gas than usual. Walking can help get rid of the air that was put into your GI tract during the procedure and reduce the bloating. If you had a lower endoscopy (such as a colonoscopy or flexible sigmoidoscopy) you may notice spotting of blood in your stool or on the toilet paper. Some abdominal soreness may be present for a day or two, also. ° °DIET: Your first meal following the procedure should be a light meal and then it is ok to progress to your normal diet. A half-sandwich or bowl of soup is an example of a good first meal. Heavy or fried foods are harder to digest and may make you feel nauseous or bloated. Drink plenty of fluids but you should avoid alcoholic beverages for 24 hours. If you had a esophageal dilation, please see attached instructions for diet.  ° °ACTIVITY: Your care partner should take you home directly after the procedure. You should plan to take it easy, moving slowly for the rest of the day. You can resume normal activity the day after the procedure however YOU SHOULD NOT DRIVE, use power tools, machinery or perform tasks that involve climbing or major physical exertion for 24 hours (because of the sedation medicines used during the test).  ° °SYMPTOMS TO REPORT IMMEDIATELY: °A gastroenterologist can be reached at any hour. Please call [phone]  for any of the following symptoms:  °Following lower endoscopy (colonoscopy, flexible sigmoidoscopy) °Excessive amounts of blood in the stool  °Significant tenderness, worsening of abdominal pains  °Swelling of the abdomen that is new, acute  °Fever of 100° or  higher  °Following upper endoscopy (EGD, EUS, ERCP, esophageal dilation) °Vomiting of blood or coffee ground material  °New, significant abdominal pain  °New, significant chest pain or pain under the shoulder blades  °Painful or persistently difficult swallowing  °New shortness of breath  °Black, tarry-looking or red, bloody stools ° °FOLLOW UP:  °If any biopsies were taken you will be contacted by phone or by letter within the next 1-3 weeks. Call [phone]  if you have not heard about the biopsies in 3 weeks.  °Please also call with any specific questions about appointments or follow up tests. °YOU HAD AN ENDOSCOPIC PROCEDURE TODAY: Refer to the procedure report and other information in the discharge instructions given to you for any specific questions about what was found during the examination. If this information does not answer your questions, please call [facility] office at [phone] to clarify.  ° °YOU SHOULD EXPECT: Some feelings of bloating in the abdomen. Passage of more gas than usual. Walking can help get rid of the air that was put into your GI tract during the procedure and reduce the bloating. If you had a lower endoscopy (such as a colonoscopy or flexible sigmoidoscopy) you may notice spotting of blood in your stool or on the toilet paper. Some abdominal soreness may be present for a day or two, also. ° °DIET: Your first meal following the procedure should be a light meal and then it is ok to progress to your normal diet. A half-sandwich or bowl of soup is an example   of a good first meal. Heavy or fried foods are harder to digest and may make you feel nauseous or bloated. Drink plenty of fluids but you should avoid alcoholic beverages for 24 hours. If you had a esophageal dilation, please see attached instructions for diet.  ° °ACTIVITY: Your care partner should take you home directly after the procedure. You should plan to take it easy, moving slowly for the rest of the day. You can resume  normal activity the day after the procedure however YOU SHOULD NOT DRIVE, use power tools, machinery or perform tasks that involve climbing or major physical exertion for 24 hours (because of the sedation medicines used during the test).  ° °SYMPTOMS TO REPORT IMMEDIATELY: °A gastroenterologist can be reached at any hour. Please call [phone]  for any of the following symptoms:  °Following lower endoscopy (colonoscopy, flexible sigmoidoscopy) °Excessive amounts of blood in the stool  °Significant tenderness, worsening of abdominal pains  °Swelling of the abdomen that is new, acute  °Fever of 100° or higher  °Following upper endoscopy (EGD, EUS, ERCP, esophageal dilation) °Vomiting of blood or coffee ground material  °New, significant abdominal pain  °New, significant chest pain or pain under the shoulder blades  °Painful or persistently difficult swallowing  °New shortness of breath  °Black, tarry-looking or red, bloody stools ° °FOLLOW UP:  °If any biopsies were taken you will be contacted by phone or by letter within the next 1-3 weeks. Call [phone]  if you have not heard about the biopsies in 3 weeks.  °Please also call with any specific questions about appointments or follow up tests. ° °

## 2017-04-07 NOTE — Anesthesia Postprocedure Evaluation (Signed)
Anesthesia Post Note  Patient: Russell Price  Procedure(s) Performed: COLONOSCOPY WITH PROPOFOL (N/A )     Patient location during evaluation: PACU Anesthesia Type: MAC Level of consciousness: awake and alert Pain management: pain level controlled Vital Signs Assessment: post-procedure vital signs reviewed and stable Respiratory status: spontaneous breathing, nonlabored ventilation and respiratory function stable Cardiovascular status: stable and blood pressure returned to baseline Anesthetic complications: no    Last Vitals:  Vitals:   04/07/17 0925 04/07/17 0930  BP: (!) 94/33 (!) 109/37  Pulse: (!) 47 (!) 46  Resp: 13 14  Temp:    SpO2: 100% 100%    Last Pain:  Vitals:   04/07/17 0917  TempSrc: Oral                 Audry Pili

## 2017-04-07 NOTE — Anesthesia Procedure Notes (Signed)
Procedure Name: MAC Date/Time: 04/07/2017 8:40 AM Performed by: Barrington Ellison, CRNA Pre-anesthesia Checklist: Patient identified, Emergency Drugs available, Suction available, Patient being monitored and Timeout performed Patient Re-evaluated:Patient Re-evaluated prior to induction Oxygen Delivery Method: Nasal cannula

## 2017-04-07 NOTE — H&P (Signed)
History of Present Illness  General:  56/[place] male was referred for initial screening colonoscopy. No family history of colon cancer. He has 1-2 Bms a day and denies black stools or blood in stool, denies abdominal pain, nausea, vomiting,loss of appetite, he has lost over 20 lbs since being diagnsoed with ESRD. He denies acid reflux, difficulty or pain on swallowing.   Current Medications  Taking   Aspir-81(Aspirin) 81 MG Tablet Delayed Release 1 tablet Orally Once a day   Calcitriol 0.25 MCG Capsule 1 capsule Orally Once a day   Rena-Vite - Tablet 1 tablet Orally Once a day   Atorvastatin Calcium 20 MG Tablet 1 tablet Orally Once a day   Carvedilol 12.5 MG Tablet 1 tablet Orally twice a day   Megestrol Acetate 40 MG Tablet 1 tablet Orally daily   Gabapentin 300 MG Capsule 1 capsule Orally Once a day   Furosemide 1 tab 80 mg Oral twice a day   Lantus SoloStar(Insulin Glargine) 100 UNIT/ML Solution Pen-injector Subcutaneous at bedn time   NovoLog(Insulin Aspart) 100 UNIT/ML Solution INJECT 2 10 UNITS INTO THE SKIN 3 TIMES DAILY BEFORE MEALS Subcutaneous   Medication List reviewed and reconciled with the patient    Past Medical History  Abnormal Nuclear Stress Test.   Anemia of Chronic Disease.   Blind R Eye.   Cerebral Thrombosis w/ Cerebral Infarction.   Coronary Artery Disease.   Debility/L Arm Weakness.   Diabetes, Type II.   Diabetic Neuropathy.   End-Stage Renal Disease.   Fluid Overload/Ascites.   Glaucoma.   Hemodialysis - Tues, Th, Sat..   Hyperlipidemia.   Hypertension.   Kidney Injury, acute.   Seizure-like Activity.   Right eye glaucoma.    Surgical History  AV Shuntogram/Fistulagram 11/27/2016  AV Fistula Placement 29/51/8841  Basilic Vein Transposition 05/14/2016  Cardiac Catheterization 03/04/2016  Dialysis Catheterization 11/08/2016  L Eye Surgery   Peripheral Vascular Balloon Angioplasty 11/27/2016   Family History  Father: alive   Mother: deceased  No Family History of Colon Cancer, Polyps, or Liver Disease.   Social History  General:  Tobacco use  cigarettes: Never smoked no Alcohol.  no Recreational drug use.    Allergies  N.K.D.A.   Hospitalization/Major Diagnostic Procedure  childbirth   kidney 07/2016   Review of Systems  CONSTITUTIONAL:  Chills No. Fatigue No. Fever No. Insomnia No. Night sweats No. Weight change No.  HEENT:  Change in vision No. Double vision No . Hoarseness No. Nose bleeds No. sore throat No. Sinus Problems No. Glaucoma YES.  CARDIOLOGY:  ByPass Surgery No. Poor Circulation No. Artificial Heart Valves No. High blood pressure No. History of Heart attack No. Irregular heart beat No. Known coronary artery disease No. Pacemaker/Defibrillator No.  RESPIRATORY:  Shortness of breath No. Cough No. Excessive Sputum No. Using Oxygen No. Asthma No. Bronchitis No. Pneumonia No. Sleep Apnea No.  UROLOGY:  Interstitials Cystitis No. Incontinence No. Prostate problems No. Blood in urine No. Difficulty urinating No. Kidney disease No. Kidney stones No.  GASTROENTEROLOGY:  Abdominal pain No. Acid reflux No. Black stools No. Bloating/belching No. Change in bowel habits No. Constipation No. Dark tarry stools No. Diarrhea No. Difficulty swallowing No. Heartburn No. Incontinence of stool No. Indigestion: No. Lactose intolerance No. Nausea No. Rectal bleeding No. Vomiting No. Hepatitis/yellow jaundice No. History of Ulcers No. Use of Pain Medication No. Previous Colonoscopy No.  MUSCULOSKELETAL:  joint pain No. Arthritis No. Joint Replacement No.  NEUROLOGY:  Dizziness No. Fainting No. Headache  No. Paralysis No. Seizures No. Strokes No. Weakness No. Alzheimer's No.  SKIN:  Rash No. Bruises easily No.  ENDOCRINOLOGY:  Diabetes No. High cholesterol No. Thyroid disorder No.  HEMATOLOGY/LYMPH:  Abnormal bleeding No. Anemia No. Enlarged lymph nodes No. Past blood transfusion No. Swollen glands No. Blood  Clots No. Using Blood Thinners No.  INFECTIOUS DISEASE:  HIV/AIDS No. Tuberculosis No . Hepatitis No. Sexually Transmitted Disease No. MRSA No.  GI PROCEDURE:  no Pacemaker/ AICD, no. no Artificial heart valves. no MI/heart attack. no Abnormal heart rhythm. no Angina. no CVA. Hypertension YES, YES. no Hypotension. no Asthma, COPD. no Sleep apnea. no Seizure disorders. no Artificial joints. no Severe DJD. Diabetes YES, YES, type II. no Significant headaches. no Vertigo. no Depression/anxiety. no Abnormal bleeding. Kidney Disease yes, YES. no Liver disease, no. no Blood transfusion.      Vital Signs  Wt 143.2, Ht 71, BMI 19.97, Temp 98.6, Pulse sitting 70, BP sitting 140/70.   Examination  Gastroenterology:: GENERAL APPEARANCE: Well developed, thinly built, no active distress, pleasant, no acute distress , HD catheter on right chest, AV fistula on left forearm.  EYES: Lids and conjunctiva normal. Sclera normal, pupils equal and reactive .  ORAL CAVITY: Lips, teeth and gums are normal. Pharynx, tongue, mucosa normal .  SCLERA: anicteric .  NECK Full ROM, trachea midline, no thyromegaly or masses .  CARDIOVASCULAR PMI LS border. Normal RRR w/o murmers or gallops. No peripheral edema .  RESPIRATORY Breath sounds normal. Respiration even and unlabored .  ABDOMEN No masses palpated. Liver and spleen not palpated, normal. Bowel sounds normal, Abdomen not distended .  EXTREMITIES: No edema, pulses intact .  NEURO: normal strength and reflexes, cranial nerves II-XII grossly intact, normal gait .  PSYCH: mood/affect normal .     Assessments   1. Screen for colon cancer - Z12.11 (Primary)   Treatment  1. Screen for colon cancer  Start TriLyte Solution Reconstituted, 420 GM, as directed, Orally IMAGING: Colonoscopy Notes: As patient has ESRD on HD and mulitple comorbidities, will proceed with a screening colonoscopy to be performed in the hospitas as an outpatient. He understands the risks and  benefits involved and consents for the procedure. He will be given written instructions and prescription for preparation and wll be scheduled for the same.     Ronnette Juniper, MD

## 2017-04-07 NOTE — Op Note (Signed)
[facility] Patient Name: Russell Price Procedure Date : 04/07/2017 MRN: [id number] Attending MD: Ronnette Juniper , MD Date of Birth: 1960/10/27 CSN: [id number] Age: 56 Admit Type: Outpatient Procedure:                Colonoscopy Indications:              Screening for colorectal malignant neoplasm, This                            is the patient's first colonoscopy Providers:                Ronnette Juniper, MD, Cleda Daub, RN, William Dalton,                            Technician Referring MD:              Medicines:                Monitored Anesthesia Care Complications:            No immediate complications. Estimated Blood Loss:     Estimated blood loss: none. Procedure:                Pre-Anesthesia Assessment:                           - Prior to the procedure, a History and Physical                            was performed, and patient medications and                            allergies were reviewed. The patient's tolerance of                            previous anesthesia was also reviewed. The risks                            and benefits of the procedure and the sedation                            options and risks were discussed with the patient.                            All questions were answered, and informed consent                            was obtained. Prior Anticoagulants: The patient has                            taken aspirin, last dose was 1 day prior to                            procedure. ASA Grade Assessment: IV - A patient  with severe systemic disease that is a constant                            threat to life. After reviewing the risks and                            benefits, the patient was deemed in satisfactory                            condition to undergo the procedure.                           After obtaining informed consent, the colonoscope                            was passed under direct vision.  Throughout the                            procedure, the patient's blood pressure, pulse, and                            oxygen saturations were monitored continuously. The                            EC-[id number] ([id number]) scope was introduced through                            the anus and advanced to the the cecum, identified                            by appendiceal orifice and ileocecal valve. The                            colonoscopy was performed without difficulty. The                            patient tolerated the procedure well. The quality                            of the bowel preparation was good. The ileocecal                            valve, the appendiceal orifice and the rectum were                            photographed. Scope In: 8:49:03 AM Scope Out: 9:09:02 AM Scope Withdrawal Time: 0 hours 7 minutes 13 seconds  Total Procedure Duration: 0 hours 19 minutes 59 seconds  Findings:      The perianal and digital rectal examinations were normal.      The colon (entire examined portion) appeared normal.      The retroflexed view of the distal rectum and anal verge was normal and       showed no anal or rectal abnormalities.  Non specific petechiae were noted in the descending during withdrawl. Impression:               - The entire examined colon is normal.                           - The distal rectum and anal verge are normal on                            retroflexion view.                           - No specimens collected. Moderate Sedation:      Patient did not receive moderate sedation for this procedure, but       instead received monitored anesthesia care. Recommendation:           - Patient has a contact number available for                            emergencies. The signs and symptoms of potential                            delayed complications were discussed with the                            patient. Return to normal activities tomorrow.                             Written discharge instructions were provided to the                            patient.                           - Low sodium diet.                           - Continue present medications.                           - Repeat colonoscopy in 10 years for screening                            purposes. Procedure Code(s):        --- Professional ---                           G3875, Colorectal cancer screening; colonoscopy on                            individual not meeting criteria for high risk Diagnosis Code(s):        --- Professional ---                           Z12.11, Encounter for screening for malignant  neoplasm of colon CPT copyright 2016 [employer]. All rights reserved. The codes documented in this report are preliminary and upon coder review may  be revised to meet current compliance requirements. Ronnette Juniper, MD 04/07/2017 9:17:51 AM This report has been signed electronically. Number of Addenda: 0

## 2017-04-07 NOTE — Transfer of Care (Signed)
Immediate Anesthesia Transfer of Care Note  Patient: Russell Price  Procedure(s) Performed: COLONOSCOPY WITH PROPOFOL (N/A )  Patient Location: Endoscopy Unit  Anesthesia Type:MAC  Level of Consciousness: lethargic and responds to stimulation  Airway & Oxygen Therapy: Patient Spontanous Breathing  Post-op Assessment: Report given to RN  Post vital signs: Reviewed  Last Vitals:  Vitals:   04/07/17 0700  BP: (!) 156/63  Pulse: (!) 55  Resp: 11  Temp: 36.5 C  SpO2: 100%    Last Pain:  Vitals:   04/07/17 0700  TempSrc: Oral         Complications: No apparent anesthesia complications

## 2017-04-08 ENCOUNTER — Other Ambulatory Visit: Payer: Self-pay

## 2017-04-08 ENCOUNTER — Encounter (HOSPITAL_COMMUNITY): Payer: Self-pay | Admitting: Gastroenterology

## 2017-04-08 DIAGNOSIS — N2581 Secondary hyperparathyroidism of renal origin: Secondary | ICD-10-CM | POA: Diagnosis not present

## 2017-04-08 DIAGNOSIS — D509 Iron deficiency anemia, unspecified: Secondary | ICD-10-CM | POA: Diagnosis not present

## 2017-04-08 DIAGNOSIS — T82598A Other mechanical complication of other cardiac and vascular devices and implants, initial encounter: Secondary | ICD-10-CM

## 2017-04-08 DIAGNOSIS — Z23 Encounter for immunization: Secondary | ICD-10-CM | POA: Diagnosis not present

## 2017-04-08 DIAGNOSIS — N186 End stage renal disease: Secondary | ICD-10-CM | POA: Diagnosis not present

## 2017-04-08 DIAGNOSIS — E1129 Type 2 diabetes mellitus with other diabetic kidney complication: Secondary | ICD-10-CM | POA: Diagnosis not present

## 2017-04-08 DIAGNOSIS — D631 Anemia in chronic kidney disease: Secondary | ICD-10-CM | POA: Diagnosis not present

## 2017-04-08 LAB — POCT I-STAT 4, (NA,K, GLUC, HGB,HCT)
GLUCOSE: 134 mg/dL — AB (ref 65–99)
HCT: 20 % — ABNORMAL LOW (ref 39.0–52.0)
Hemoglobin: 6.8 g/dL — CL (ref 13.0–17.0)
Potassium: 6.1 mmol/L — ABNORMAL HIGH (ref 3.5–5.1)
SODIUM: 132 mmol/L — AB (ref 135–145)

## 2017-04-09 ENCOUNTER — Telehealth: Payer: Self-pay | Admitting: Family Medicine

## 2017-04-09 DIAGNOSIS — D509 Iron deficiency anemia, unspecified: Secondary | ICD-10-CM | POA: Diagnosis not present

## 2017-04-09 DIAGNOSIS — N186 End stage renal disease: Secondary | ICD-10-CM | POA: Diagnosis not present

## 2017-04-09 DIAGNOSIS — Z23 Encounter for immunization: Secondary | ICD-10-CM | POA: Diagnosis not present

## 2017-04-09 DIAGNOSIS — E1129 Type 2 diabetes mellitus with other diabetic kidney complication: Secondary | ICD-10-CM | POA: Diagnosis not present

## 2017-04-09 DIAGNOSIS — N2581 Secondary hyperparathyroidism of renal origin: Secondary | ICD-10-CM | POA: Diagnosis not present

## 2017-04-09 DIAGNOSIS — D631 Anemia in chronic kidney disease: Secondary | ICD-10-CM | POA: Diagnosis not present

## 2017-04-09 MED ORDER — GLUCOSE BLOOD VI STRP: ORAL_STRIP | 12 refills | Status: DC

## 2017-04-09 NOTE — Telephone Encounter (Signed)
Pt called to let us know that he need a new prescription to be sent to [facility] on [address] since he has [employer], please sent   ACCU-CHEK AVIVA PLUS test strip  Please follow up

## 2017-04-09 NOTE — Telephone Encounter (Signed)
Refilled

## 2017-04-12 DIAGNOSIS — D509 Iron deficiency anemia, unspecified: Secondary | ICD-10-CM | POA: Diagnosis not present

## 2017-04-12 DIAGNOSIS — N2581 Secondary hyperparathyroidism of renal origin: Secondary | ICD-10-CM | POA: Diagnosis not present

## 2017-04-12 DIAGNOSIS — Z23 Encounter for immunization: Secondary | ICD-10-CM | POA: Diagnosis not present

## 2017-04-12 DIAGNOSIS — D631 Anemia in chronic kidney disease: Secondary | ICD-10-CM | POA: Diagnosis not present

## 2017-04-12 DIAGNOSIS — N186 End stage renal disease: Secondary | ICD-10-CM | POA: Diagnosis not present

## 2017-04-12 DIAGNOSIS — E1129 Type 2 diabetes mellitus with other diabetic kidney complication: Secondary | ICD-10-CM | POA: Diagnosis not present

## 2017-04-14 MED FILL — CARVEDILOL 12.5 MG TABLET: 12.5 | 30 days supply | Qty: 60 | Fill #1

## 2017-04-14 MED FILL — CALCITRIOL 0.25 MCG CAPS: 0.25 | 30 days supply | Qty: 30 | Fill #5

## 2017-04-14 MED FILL — ATORVASTATIN 40 MG TABLET: 40 | 30 days supply | Qty: 30 | Fill #1

## 2017-04-14 MED FILL — GABAPENTIN 300 MG CAPSULE: 300 | 30 days supply | Qty: 30 | Fill #1

## 2017-04-15 DIAGNOSIS — E1129 Type 2 diabetes mellitus with other diabetic kidney complication: Secondary | ICD-10-CM | POA: Diagnosis not present

## 2017-04-15 DIAGNOSIS — D509 Iron deficiency anemia, unspecified: Secondary | ICD-10-CM | POA: Diagnosis not present

## 2017-04-15 DIAGNOSIS — Z23 Encounter for immunization: Secondary | ICD-10-CM | POA: Diagnosis not present

## 2017-04-15 DIAGNOSIS — D631 Anemia in chronic kidney disease: Secondary | ICD-10-CM | POA: Diagnosis not present

## 2017-04-15 DIAGNOSIS — N186 End stage renal disease: Secondary | ICD-10-CM | POA: Diagnosis not present

## 2017-04-15 DIAGNOSIS — N2581 Secondary hyperparathyroidism of renal origin: Secondary | ICD-10-CM | POA: Diagnosis not present

## 2017-04-17 DIAGNOSIS — D631 Anemia in chronic kidney disease: Secondary | ICD-10-CM | POA: Diagnosis not present

## 2017-04-17 DIAGNOSIS — N2581 Secondary hyperparathyroidism of renal origin: Secondary | ICD-10-CM | POA: Diagnosis not present

## 2017-04-17 DIAGNOSIS — D509 Iron deficiency anemia, unspecified: Secondary | ICD-10-CM | POA: Diagnosis not present

## 2017-04-17 DIAGNOSIS — Z23 Encounter for immunization: Secondary | ICD-10-CM | POA: Diagnosis not present

## 2017-04-17 DIAGNOSIS — N186 End stage renal disease: Secondary | ICD-10-CM | POA: Diagnosis not present

## 2017-04-17 DIAGNOSIS — E1129 Type 2 diabetes mellitus with other diabetic kidney complication: Secondary | ICD-10-CM | POA: Diagnosis not present

## 2017-04-18 DIAGNOSIS — N186 End stage renal disease: Secondary | ICD-10-CM | POA: Diagnosis not present

## 2017-04-18 DIAGNOSIS — Z992 Dependence on renal dialysis: Secondary | ICD-10-CM | POA: Diagnosis not present

## 2017-04-18 DIAGNOSIS — E1122 Type 2 diabetes mellitus with diabetic chronic kidney disease: Secondary | ICD-10-CM | POA: Diagnosis not present

## 2017-04-19 DIAGNOSIS — N186 End stage renal disease: Secondary | ICD-10-CM | POA: Diagnosis not present

## 2017-04-19 DIAGNOSIS — E1129 Type 2 diabetes mellitus with other diabetic kidney complication: Secondary | ICD-10-CM | POA: Diagnosis not present

## 2017-04-19 DIAGNOSIS — N2581 Secondary hyperparathyroidism of renal origin: Secondary | ICD-10-CM | POA: Diagnosis not present

## 2017-04-19 DIAGNOSIS — D631 Anemia in chronic kidney disease: Secondary | ICD-10-CM | POA: Diagnosis not present

## 2017-04-22 DIAGNOSIS — E1129 Type 2 diabetes mellitus with other diabetic kidney complication: Secondary | ICD-10-CM | POA: Diagnosis not present

## 2017-04-22 DIAGNOSIS — D631 Anemia in chronic kidney disease: Secondary | ICD-10-CM | POA: Diagnosis not present

## 2017-04-22 DIAGNOSIS — N2581 Secondary hyperparathyroidism of renal origin: Secondary | ICD-10-CM | POA: Diagnosis not present

## 2017-04-22 DIAGNOSIS — N186 End stage renal disease: Secondary | ICD-10-CM | POA: Diagnosis not present

## 2017-04-24 DIAGNOSIS — N186 End stage renal disease: Secondary | ICD-10-CM | POA: Diagnosis not present

## 2017-04-24 DIAGNOSIS — D631 Anemia in chronic kidney disease: Secondary | ICD-10-CM | POA: Diagnosis not present

## 2017-04-24 DIAGNOSIS — N2581 Secondary hyperparathyroidism of renal origin: Secondary | ICD-10-CM | POA: Diagnosis not present

## 2017-04-24 DIAGNOSIS — E1129 Type 2 diabetes mellitus with other diabetic kidney complication: Secondary | ICD-10-CM | POA: Diagnosis not present

## 2017-04-26 DIAGNOSIS — N186 End stage renal disease: Secondary | ICD-10-CM | POA: Diagnosis not present

## 2017-04-26 DIAGNOSIS — N2581 Secondary hyperparathyroidism of renal origin: Secondary | ICD-10-CM | POA: Diagnosis not present

## 2017-04-26 DIAGNOSIS — D631 Anemia in chronic kidney disease: Secondary | ICD-10-CM | POA: Diagnosis not present

## 2017-04-26 DIAGNOSIS — E1129 Type 2 diabetes mellitus with other diabetic kidney complication: Secondary | ICD-10-CM | POA: Diagnosis not present

## 2017-04-30 DIAGNOSIS — N2581 Secondary hyperparathyroidism of renal origin: Secondary | ICD-10-CM | POA: Diagnosis not present

## 2017-04-30 DIAGNOSIS — D631 Anemia in chronic kidney disease: Secondary | ICD-10-CM | POA: Diagnosis not present

## 2017-04-30 DIAGNOSIS — E1129 Type 2 diabetes mellitus with other diabetic kidney complication: Secondary | ICD-10-CM | POA: Diagnosis not present

## 2017-04-30 DIAGNOSIS — N186 End stage renal disease: Secondary | ICD-10-CM | POA: Diagnosis not present

## 2017-05-01 DIAGNOSIS — N186 End stage renal disease: Secondary | ICD-10-CM | POA: Diagnosis not present

## 2017-05-01 DIAGNOSIS — E1129 Type 2 diabetes mellitus with other diabetic kidney complication: Secondary | ICD-10-CM | POA: Diagnosis not present

## 2017-05-01 DIAGNOSIS — N2581 Secondary hyperparathyroidism of renal origin: Secondary | ICD-10-CM | POA: Diagnosis not present

## 2017-05-01 DIAGNOSIS — D631 Anemia in chronic kidney disease: Secondary | ICD-10-CM | POA: Diagnosis not present

## 2017-05-01 MED FILL — LANTUS 100 UNITS/ML VIAL: 100 | 28 days supply | Qty: 10 | Fill #1

## 2017-05-01 MED FILL — NovoLOG 100 UNIT/ML SOLN: 100 | 27 days supply | Qty: 10 | Fill #1

## 2017-05-02 ENCOUNTER — Other Ambulatory Visit: Payer: Self-pay | Admitting: *Deleted

## 2017-05-02 ENCOUNTER — Ambulatory Visit (HOSPITAL_COMMUNITY)
Admission: RE | Admit: 2017-05-02 | Discharge: 2017-05-02 | Disposition: A | Discharge status: Home / Self Care | Payer: Medicare Other | Source: Ambulatory Visit | Attending: Vascular Surgery | Admitting: Vascular Surgery

## 2017-05-02 ENCOUNTER — Encounter: Payer: Self-pay | Admitting: Vascular Surgery

## 2017-05-02 ENCOUNTER — Encounter: Payer: Self-pay | Admitting: *Deleted

## 2017-05-02 ENCOUNTER — Ambulatory Visit (INDEPENDENT_AMBULATORY_CARE_PROVIDER_SITE_OTHER): Payer: Medicare Other | Admitting: Vascular Surgery

## 2017-05-02 VITALS — BP 173/70 | HR 51 | Temp 98.0°F | Resp 20 | Ht 71.0 in | Wt 153.0 lb

## 2017-05-02 DIAGNOSIS — Y832 Surgical operation with anastomosis, bypass or graft as the cause of abnormal reaction of the patient, or of later complication, without mention of misadventure at the time of the procedure: Secondary | ICD-10-CM | POA: Diagnosis not present

## 2017-05-02 DIAGNOSIS — Z992 Dependence on renal dialysis: Secondary | ICD-10-CM

## 2017-05-02 DIAGNOSIS — T82598A Other mechanical complication of other cardiac and vascular devices and implants, initial encounter: Secondary | ICD-10-CM | POA: Diagnosis not present

## 2017-05-02 DIAGNOSIS — N186 End stage renal disease: Secondary | ICD-10-CM

## 2017-05-02 NOTE — Progress Notes (Signed)
d 

## 2017-05-02 NOTE — Progress Notes (Signed)
Patient ID: Russell Price, male   DOB: [birth year], 58 y.o.   MRN: [id number]  Reason for Consult: Follow-up (eval access unable to use AVF consistently - HD TTS )   Referred by Arnoldo Morale, MD  Subjective:     HPI:  Russell Price is a 56 y.o. male with end-stage renal disease currently on dialysis via catheter in the left IJ.  He has a history of a left brachiobasilic AV fistula from October 2017 that was then elevated and underwent balloon angioplasty in July 2018.  He is now on dialysis via catheter as they cannot use the fistula due to difficulty with aspirating clot and infiltration events.  He is not having any pain in his left arm.  He states that the wounds of all healed well.  Left hand is doing fine.  He does not take blood thinners.  Past Medical History:  Diagnosis Date  . CAD (coronary artery disease)    a. underwent cath in 02/2016 after an abnormal nuc which showed 2V CAD in RCA and OM3 with no good targets for PCI. Medical managment recommended.   . Chronic kidney disease   . Diabetes mellitus without complication ([place])    type II - followed by Dr Chalmers Cater   . Difficult intubation    ER intubation 2016  . ESRD (end stage renal disease) ([place])    Tues, Th, Sat [place]  . Glaucoma   . Hemodialysis patient [facility])    has been on dialysis since approx 10/2016   . Hypertension    followed by [facility]  . Stroke [facility])    patient denies on 02/19/2017    Family History  Problem Relation Age of Onset  . Diabetes Mother    Past Surgical History:  Procedure Laterality Date  . A/V FISTULAGRAM Left 11/27/2016   Procedure: A/V Fistulagram;  Surgeon: Waynetta Sandy, MD;  Location: [place] [facility];  Service: Cardiovascular;  Laterality: Left;  . AV FISTULA PLACEMENT Left 03/12/2016   Procedure: Left Arm ARTERIOVENOUS (AV) FISTULA CREATION;  Surgeon: Waynetta Sandy, MD;  Location: [place];  Service: Vascular;  Laterality: Left;  .  BASCILIC VEIN TRANSPOSITION Left 05/14/2016   Procedure: SECOND STAGE LEFT BASILIC VEIN TRANSPOSITION;  Surgeon: Waynetta Sandy, MD;  Location: [place];  Service: Vascular;  Laterality: Left;  . BRONCHOSCOPY  02/14/2015   for pulm hemorrhage  . CARDIAC CATHETERIZATION N/A 03/04/2016   Procedure: Left Heart Cath and Coronary Angiography;  Surgeon: Leonie Man, MD;  Location: [place] [facility];  Service: Cardiovascular;  Laterality: N/A;  . COLONOSCOPY WITH PROPOFOL N/A 04/07/2017   Procedure: COLONOSCOPY WITH PROPOFOL;  Surgeon: Ronnette Juniper, MD;  Location: [place];  Service: Gastroenterology;  Laterality: N/A;  . EYE SURGERY Left   . INSERTION OF DIALYSIS CATHETER Right 11/08/2016   Procedure: INSERTION OF DIALYSIS CATHETER;  Surgeon: Rosetta Posner, MD;  Location: [place];  Service: Vascular;  Laterality: Right;  . IR PARACENTESIS  11/13/2016  . PERIPHERAL VASCULAR BALLOON ANGIOPLASTY Left 11/27/2016   Procedure: Peripheral Vascular Balloon Angioplasty;  Surgeon: Waynetta Sandy, MD;  Location: [place] [facility];  Service: Cardiovascular;  Laterality: Left;  ARM FISTULA    Short Social History:  Social History   Tobacco Use  . Smoking status: Former Smoker    Packs/day: 0.00    Years: 4.00    Pack years: 0.00    Last attempt to quit: 02/23/1983  Years since quitting: 34.2  . Smokeless tobacco: Never Used  Substance Use Topics  . Alcohol use: No    No Known Allergies  Current Outpatient Medications  Medication Sig Dispense Refill  . aspirin 81 MG chewable tablet Chew 1 tablet (81 mg total) by mouth daily. (Patient taking differently: Chew 81 mg by mouth every evening. )    . atorvastatin (LIPITOR) 40 MG tablet Take 1 tablet (40 mg total) by mouth daily at 6 PM. 30 tablet 5  . Blood Glucose Monitoring Suppl (ACCU-CHEK AVIVA) device Use as instructed 3 times daily before meals and at bedtime. 1 each 0  . calcitRIOL (ROCALTROL) 0.25 MCG capsule Take 0.25 mcg  by mouth daily.  6  . carvedilol (COREG) 12.5 MG tablet Take 1 tablet (12.5 mg total) by mouth 2 (two) times daily with a meal. 60 tablet 5  . Darbepoetin Alfa (ARANESP) 150 MCG/0.3ML SOSY injection Inject 0.3 mLs (150 mcg total) into the vein every Saturday with hemodialysis. 1.68 mL 0  . furosemide (LASIX) 80 MG tablet Take 80 mg by mouth 2 (two) times daily.    Marland Kitchen gabapentin (NEURONTIN) 300 MG capsule Take 1 capsule (300 mg total) by mouth daily. 30 capsule 5  . glucose blood (ACCU-CHEK AVIVA PLUS) test strip Use 3 times daily before meals and at bedtime. E11.9 100 each 12  . hydrocortisone (ANUSOL-HC) 2.5 % rectal cream Place rectally 2 (two) times daily. 30 g 0  . insulin aspart (NOVOLOG) 100 UNIT/ML injection 0-12 units 3 times daily before meals as well as per sliding scale 10 mL 5  . insulin glargine (LANTUS) 100 UNIT/ML injection Inject 0.15 mLs (15 Units total) into the skin at bedtime. 10 mL 5  . Lancet Devices (ACCU-CHEK SOFTCLIX) lancets Use as instructed 3 times daily before meals and at bedtime 1 each 12  . multivitamin (RENA-VIT) TABS tablet Take 1 tablet by mouth daily.    . Nutritional Supplements (FEEDING SUPPLEMENT, NEPRO CARB STEADY,) LIQD Take 237 mLs by mouth 2 (two) times daily between meals. 60 Can 0  . ondansetron (ZOFRAN) 4 MG tablet Take 1 tablet (4 mg total) by mouth every 8 (eight) hours as needed for nausea or vomiting. 30 tablet 1  . polyethylene glycol (MIRALAX / GLYCOLAX) packet Take 17 g by mouth daily. 14 each 0   No current facility-administered medications for this visit.     Review of Systems  Constitutional:  Constitutional negative. HENT: HENT negative.  Eyes: Eyes negative.  Respiratory: Respiratory negative.  Cardiovascular: Cardiovascular negative.  GI: Gastrointestinal negative.  Musculoskeletal: Musculoskeletal negative.  Skin: Skin negative.  Neurological: Neurological negative. Psychiatric: Psychiatric negative.        Objective:    Objective   Vitals:   05/02/17 0840 05/02/17 0841  BP: (!) 171/72 (!) 173/70  Pulse: (!) 51   Resp: 20   Temp: 98 F (36.7 C)   TempSrc: Oral   SpO2: 100%   Weight: 153 lb (69.4 kg)   Height: 5\' 11"  (1.803 m)    Body mass index is 21.34 kg/m.  Physical Exam  Constitutional: He is oriented to person, place, and time. He appears well-developed.  HENT:  Head: Normocephalic.  Eyes: Pupils are equal, round, and reactive to light.  Cardiovascular: Normal rate.  Pulses:      Radial pulses are 2+ on the left side.  Pulmonary/Chest: Effort normal.  Abdominal: Soft.  Musculoskeletal: Normal range of motion. He exhibits no edema.  Strong thrill in left  upper arm  Neurological: He is alert and oriented to person, place, and time.  Skin: Skin is warm and dry.  Psychiatric: He has a normal mood and affect. His behavior is normal. Judgment and thought content normal.    Data: I have independently interpreted his dialysis duplex today which demonstrates flow volumes of 1040 mL/min.  The diameter is only 0.46 cm in the distal upper arm but otherwise greater than 0.6 cm and the depth is less than 0.6 cm.     Assessment/Plan:     56 year old male with a elevated brachiobasilic AV fistula that is undergone balloon angioplasty in the upper arm in the past.  Unfortunately they are having difficulty with cannulation and he is getting dialysis via a catheter at this time.  Fistula does feel good and has adequate flow volume so I am unsure what the issue is that we will get him scheduled for fistulogram to evaluate with possible intervention.  He demonstrates good understanding we will schedule him today.     Waynetta Sandy MD [facility]

## 2017-05-03 DIAGNOSIS — E1129 Type 2 diabetes mellitus with other diabetic kidney complication: Secondary | ICD-10-CM | POA: Diagnosis not present

## 2017-05-03 DIAGNOSIS — D631 Anemia in chronic kidney disease: Secondary | ICD-10-CM | POA: Diagnosis not present

## 2017-05-03 DIAGNOSIS — N2581 Secondary hyperparathyroidism of renal origin: Secondary | ICD-10-CM | POA: Diagnosis not present

## 2017-05-03 DIAGNOSIS — N186 End stage renal disease: Secondary | ICD-10-CM | POA: Diagnosis not present

## 2017-05-05 ENCOUNTER — Telehealth: Payer: Self-pay | Admitting: Family Medicine

## 2017-05-05 ENCOUNTER — Other Ambulatory Visit: Payer: Self-pay

## 2017-05-05 DIAGNOSIS — E113512 Type 2 diabetes mellitus with proliferative diabetic retinopathy with macular edema, left eye: Secondary | ICD-10-CM | POA: Diagnosis not present

## 2017-05-05 MED ORDER — TRUEPLUS LANCETS 28G MISC: 1.0000 | Freq: Three times a day (TID) | 11 refills | Status: DC

## 2017-05-05 MED ORDER — GLUCOSE BLOOD VI STRP: 1.0000 | ORAL_STRIP | Freq: Three times a day (TID) | 12 refills | Status: DC

## 2017-05-05 MED ORDER — TRUE METRIX METER DEVI: 1.0000 | Freq: Three times a day (TID) | 0 refills | Status: DC

## 2017-05-05 NOTE — Telephone Encounter (Signed)
Please find out what kind of meter he has, if he still has Medicare and send appropriate strips (Accu-Chek versus true metrix) to the pharmacy. Thanks

## 2017-05-05 NOTE — Telephone Encounter (Signed)
I have sent over true matrix due to patient not having insurance at this time.

## 2017-05-05 NOTE — Telephone Encounter (Signed)
Pt need refill for strip to check his blood sugar since the one he has is to expensive for him and since is not working e need something more cheaper, please it you auth please sent it to [facility], please follow up

## 2017-05-06 ENCOUNTER — Other Ambulatory Visit: Payer: Self-pay

## 2017-05-06 DIAGNOSIS — E1129 Type 2 diabetes mellitus with other diabetic kidney complication: Secondary | ICD-10-CM | POA: Diagnosis not present

## 2017-05-06 DIAGNOSIS — N186 End stage renal disease: Secondary | ICD-10-CM | POA: Diagnosis not present

## 2017-05-06 DIAGNOSIS — D631 Anemia in chronic kidney disease: Secondary | ICD-10-CM | POA: Diagnosis not present

## 2017-05-06 DIAGNOSIS — N2581 Secondary hyperparathyroidism of renal origin: Secondary | ICD-10-CM | POA: Diagnosis not present

## 2017-05-06 MED ORDER — ACCU-CHEK SOFTCLIX LANCET DEV MISC: 12 refills | Status: DC

## 2017-05-06 MED ORDER — ACCU-CHEK AVIVA DEVI: 0 refills | Status: DC

## 2017-05-06 MED ORDER — GLUCOSE BLOOD VI STRP: ORAL_STRIP | 12 refills | Status: DC

## 2017-05-07 ENCOUNTER — Encounter (HOSPITAL_COMMUNITY)
Admission: RE | Disposition: A | Discharge status: Home / Self Care | Payer: Self-pay | Source: Ambulatory Visit | Attending: Vascular Surgery

## 2017-05-07 ENCOUNTER — Other Ambulatory Visit: Payer: Self-pay | Admitting: Pharmacist

## 2017-05-07 ENCOUNTER — Ambulatory Visit (HOSPITAL_COMMUNITY)
Admission: RE | Admit: 2017-05-07 | Discharge: 2017-05-07 | Disposition: A | Discharge status: Home / Self Care | Payer: Medicare Other | Source: Ambulatory Visit | Attending: Vascular Surgery | Admitting: Vascular Surgery

## 2017-05-07 DIAGNOSIS — I2510 Atherosclerotic heart disease of native coronary artery without angina pectoris: Secondary | ICD-10-CM | POA: Insufficient documentation

## 2017-05-07 DIAGNOSIS — Z794 Long term (current) use of insulin: Secondary | ICD-10-CM | POA: Insufficient documentation

## 2017-05-07 DIAGNOSIS — Z7982 Long term (current) use of aspirin: Secondary | ICD-10-CM | POA: Diagnosis not present

## 2017-05-07 DIAGNOSIS — Z87891 Personal history of nicotine dependence: Secondary | ICD-10-CM | POA: Diagnosis not present

## 2017-05-07 DIAGNOSIS — I120 Hypertensive chronic kidney disease with stage 5 chronic kidney disease or end stage renal disease: Secondary | ICD-10-CM | POA: Insufficient documentation

## 2017-05-07 DIAGNOSIS — Y832 Surgical operation with anastomosis, bypass or graft as the cause of abnormal reaction of the patient, or of later complication, without mention of misadventure at the time of the procedure: Secondary | ICD-10-CM | POA: Diagnosis not present

## 2017-05-07 DIAGNOSIS — N186 End stage renal disease: Secondary | ICD-10-CM | POA: Insufficient documentation

## 2017-05-07 DIAGNOSIS — E1122 Type 2 diabetes mellitus with diabetic chronic kidney disease: Secondary | ICD-10-CM | POA: Diagnosis not present

## 2017-05-07 DIAGNOSIS — Z8673 Personal history of transient ischemic attack (TIA), and cerebral infarction without residual deficits: Secondary | ICD-10-CM | POA: Insufficient documentation

## 2017-05-07 DIAGNOSIS — T82898A Other specified complication of vascular prosthetic devices, implants and grafts, initial encounter: Secondary | ICD-10-CM

## 2017-05-07 DIAGNOSIS — Z79899 Other long term (current) drug therapy: Secondary | ICD-10-CM | POA: Diagnosis not present

## 2017-05-07 DIAGNOSIS — Z992 Dependence on renal dialysis: Secondary | ICD-10-CM | POA: Insufficient documentation

## 2017-05-07 HISTORY — PX: PERIPHERAL VASCULAR BALLOON ANGIOPLASTY: CATH118281

## 2017-05-07 HISTORY — PX: A/V FISTULAGRAM: CATH118298

## 2017-05-07 LAB — POCT I-STAT, CHEM 8
BUN: 37 mg/dL — AB (ref 6–20)
CHLORIDE: 100 mmol/L — AB (ref 101–111)
CREATININE: 5.2 mg/dL — AB (ref 0.61–1.24)
Calcium, Ion: 1.13 mmol/L — ABNORMAL LOW (ref 1.15–1.40)
Glucose, Bld: 176 mg/dL — ABNORMAL HIGH (ref 65–99)
HEMATOCRIT: 45 % (ref 39.0–52.0)
Hemoglobin: 15.3 g/dL (ref 13.0–17.0)
Potassium: 4 mmol/L (ref 3.5–5.1)
Sodium: 137 mmol/L (ref 135–145)
TCO2: 24 mmol/L (ref 22–32)

## 2017-05-07 LAB — GLUCOSE, CAPILLARY: Glucose-Capillary: 134 mg/dL — ABNORMAL HIGH (ref 65–99)

## 2017-05-07 SURGERY — A/V FISTULAGRAM
Anesthesia: LOCAL | Laterality: Left

## 2017-05-07 MED ORDER — IODIXANOL 320 MG/ML IV SOLN
INTRAVENOUS | Status: DC | PRN
  Administered 2017-05-07: 60 mL via INTRAVENOUS

## 2017-05-07 MED ORDER — SODIUM CHLORIDE 0.9 % IV SOLN: 250.0000 mL | INTRAVENOUS | Status: DC | PRN

## 2017-05-07 MED ORDER — ACCU-CHEK SOFTCLIX LANCET DEV MISC: 12 refills | Status: AC

## 2017-05-07 MED ORDER — LIDOCAINE HCL (PF) 1 % IJ SOLN
INTRAMUSCULAR | Status: DC | PRN
  Administered 2017-05-07: 2 mL via SUBCUTANEOUS

## 2017-05-07 MED ORDER — SODIUM CHLORIDE 0.9% FLUSH: 3.0000 mL | Freq: Two times a day (BID) | INTRAVENOUS | Status: DC

## 2017-05-07 MED ORDER — ACCU-CHEK AVIVA DEVI: 0 refills | Status: DC

## 2017-05-07 MED ORDER — LIDOCAINE HCL (PF) 1 % IJ SOLN
INTRAMUSCULAR | Status: AC
  Filled 2017-05-07: qty 30

## 2017-05-07 MED ORDER — GLUCOSE BLOOD VI STRP: ORAL_STRIP | 12 refills | Status: DC

## 2017-05-07 MED ORDER — HEPARIN (PORCINE) IN NACL 2-0.9 UNIT/ML-% IJ SOLN
INTRAMUSCULAR | Status: AC | PRN
  Administered 2017-05-07: 500 mL

## 2017-05-07 MED ORDER — ACCU-CHEK SOFTCLIX LANCETS MISC: 12 refills | Status: DC

## 2017-05-07 MED ORDER — HEPARIN (PORCINE) IN NACL 2-0.9 UNIT/ML-% IJ SOLN
INTRAMUSCULAR | Status: AC
  Filled 2017-05-07: qty 500

## 2017-05-07 MED ORDER — SODIUM CHLORIDE 0.9% FLUSH: 3.0000 mL | INTRAVENOUS | Status: DC | PRN

## 2017-05-07 SURGICAL SUPPLY — 17 items
BAG SNAP BAND KOVER 36X36 (MISCELLANEOUS) ×2 IMPLANT
BALLN MUSTANG 6X80X75 (BALLOONS) ×2
BALLN MUSTANG 8X80X75 (BALLOONS) ×2
BALLOON MUSTANG 6X80X75 (BALLOONS) ×1 IMPLANT
BALLOON MUSTANG 8X80X75 (BALLOONS) ×1 IMPLANT
COVER DOME SNAP 22 D (MISCELLANEOUS) ×2 IMPLANT
COVER PRB 48X5XTLSCP FOLD TPE (BAG) ×1 IMPLANT
COVER PROBE 5X48 (BAG) ×1
KIT ENCORE 26 ADVANTAGE (KITS) ×2 IMPLANT
KIT MICROINTRODUCER STIFF 5F (SHEATH) ×2 IMPLANT
PROTECTION STATION PRESSURIZED (MISCELLANEOUS) ×2
SHEATH PINNACLE R/O II 6F 4CM (SHEATH) ×2 IMPLANT
STATION PROTECTION PRESSURIZED (MISCELLANEOUS) ×1 IMPLANT
STOPCOCK MORSE 400PSI 3WAY (MISCELLANEOUS) ×2 IMPLANT
TRAY PV CATH (CUSTOM PROCEDURE TRAY) ×2 IMPLANT
TUBING CIL FLEX 10 FLL-RA (TUBING) ×2 IMPLANT
WIRE BENTSON .035X145CM (WIRE) ×2 IMPLANT

## 2017-05-07 NOTE — Op Note (Signed)
    Patient name: Russell Price MRN: [id number] DOB: 16-Dec-1960 Sex: male  05/07/2017 Pre-operative Diagnosis: ersd, primary malfunction of left arm avf Post-operative diagnosis:  Same Surgeon:  Eda Paschal. Donzetta Matters, MD Procedure Performed: 1.  US guided cannulation of left arm avf 2.  fistulogram left upper arm avf 3.  Balloon angioplasty of left arm basilic vein fistula with 6 and 37mm balloons  Indications: 56 year old male with end-stage renal disease currently dialyzing via left IJ tunneled catheter.  He has a 2-stage left-sided basilic vein transposition fistula that has had primary malfunction withdrawn clots on dialysis.  He is now indicated for fistula and possible intervention.  Findings: Fistula is patent throughout its course.  Centrally there is no stenosis there is a presence of a left IJ catheter in place.  The fistula itself in the arm appears undersized and somewhat immature.  Following balloon angioplasty with 6 and 8 mm balloons there is increased diameter and improved flow by palpation.  Retrograde imaging demonstrates patent arterial anastomosis.   Procedure:  The patient was identified in the holding area and taken to room 8.  The patient was then placed supine on the table and prepped and draped in the usual sterile fashion.  A time out was called.  Ultrasound was used to evaluate the left arm AV fistula which was then cannulated with micropuncture needle and a sheath was placed and fistulogram performed.  We then exchanged over a Bentson wire for a 6 French sheath and performed balloon H plasty first with 6 mm balloon followed by 8 mm balloon.  With the balloon inflated we performed retrograde angiogram demonstrated patent arterial and.  Following balloon Angie plasty there was improved flow by palpation and also angiographically.  Satisfied with this wire was removed as well as the sheath and cannulation site was closed with 4-0 Monocryl suture.  Patient tolerated procedure well  without immediate complication.  Contrast: 60cc   Acasia Skilton C. Donzetta Matters, MD [facility] [place] Office: [phone] Pager: [phone]

## 2017-05-07 NOTE — H&P (Signed)
   History and Physical Update  The patient was interviewed and re-examined.  The patient's previous History and Physical has been reviewed and is unchanged from recent office visit. Plan for fistulogram left arm avf.  Tisa Weisel C. Donzetta Matters, MD [facility] [place] Office: [phone] Pager: [phone]   05/07/2017, 10:07 AM

## 2017-05-07 NOTE — Discharge Instructions (Signed)

## 2017-05-08 ENCOUNTER — Encounter (HOSPITAL_COMMUNITY): Payer: Self-pay | Admitting: Vascular Surgery

## 2017-05-08 DIAGNOSIS — N186 End stage renal disease: Secondary | ICD-10-CM | POA: Diagnosis not present

## 2017-05-08 DIAGNOSIS — E1129 Type 2 diabetes mellitus with other diabetic kidney complication: Secondary | ICD-10-CM | POA: Diagnosis not present

## 2017-05-08 DIAGNOSIS — N2581 Secondary hyperparathyroidism of renal origin: Secondary | ICD-10-CM | POA: Diagnosis not present

## 2017-05-08 DIAGNOSIS — D631 Anemia in chronic kidney disease: Secondary | ICD-10-CM | POA: Diagnosis not present

## 2017-05-10 DIAGNOSIS — D631 Anemia in chronic kidney disease: Secondary | ICD-10-CM | POA: Diagnosis not present

## 2017-05-10 DIAGNOSIS — E1129 Type 2 diabetes mellitus with other diabetic kidney complication: Secondary | ICD-10-CM | POA: Diagnosis not present

## 2017-05-10 DIAGNOSIS — N2581 Secondary hyperparathyroidism of renal origin: Secondary | ICD-10-CM | POA: Diagnosis not present

## 2017-05-10 DIAGNOSIS — N186 End stage renal disease: Secondary | ICD-10-CM | POA: Diagnosis not present

## 2017-05-12 DIAGNOSIS — D631 Anemia in chronic kidney disease: Secondary | ICD-10-CM | POA: Diagnosis not present

## 2017-05-12 DIAGNOSIS — N186 End stage renal disease: Secondary | ICD-10-CM | POA: Diagnosis not present

## 2017-05-12 DIAGNOSIS — E1129 Type 2 diabetes mellitus with other diabetic kidney complication: Secondary | ICD-10-CM | POA: Diagnosis not present

## 2017-05-12 DIAGNOSIS — N2581 Secondary hyperparathyroidism of renal origin: Secondary | ICD-10-CM | POA: Diagnosis not present

## 2017-05-15 DIAGNOSIS — E1129 Type 2 diabetes mellitus with other diabetic kidney complication: Secondary | ICD-10-CM | POA: Diagnosis not present

## 2017-05-15 DIAGNOSIS — N2581 Secondary hyperparathyroidism of renal origin: Secondary | ICD-10-CM | POA: Diagnosis not present

## 2017-05-15 DIAGNOSIS — N186 End stage renal disease: Secondary | ICD-10-CM | POA: Diagnosis not present

## 2017-05-15 DIAGNOSIS — D631 Anemia in chronic kidney disease: Secondary | ICD-10-CM | POA: Diagnosis not present

## 2017-05-15 MED FILL — GABAPENTIN 300 MG CAPSULE: 300 | 30 days supply | Qty: 30 | Fill #2

## 2017-05-15 MED FILL — ATORVASTATIN 40 MG TABLET: 40 | 30 days supply | Qty: 30 | Fill #2

## 2017-05-15 MED FILL — CARVEDILOL 12.5 MG TABLET: 12.5 | 30 days supply | Qty: 60 | Fill #2

## 2017-05-16 ENCOUNTER — Encounter (HOSPITAL_COMMUNITY): Payer: Self-pay | Admitting: Vascular Surgery

## 2017-05-17 DIAGNOSIS — D631 Anemia in chronic kidney disease: Secondary | ICD-10-CM | POA: Diagnosis not present

## 2017-05-17 DIAGNOSIS — N186 End stage renal disease: Secondary | ICD-10-CM | POA: Diagnosis not present

## 2017-05-17 DIAGNOSIS — N2581 Secondary hyperparathyroidism of renal origin: Secondary | ICD-10-CM | POA: Diagnosis not present

## 2017-05-17 DIAGNOSIS — E1129 Type 2 diabetes mellitus with other diabetic kidney complication: Secondary | ICD-10-CM | POA: Diagnosis not present

## 2017-05-19 DIAGNOSIS — N2581 Secondary hyperparathyroidism of renal origin: Secondary | ICD-10-CM | POA: Diagnosis not present

## 2017-05-19 DIAGNOSIS — N186 End stage renal disease: Secondary | ICD-10-CM | POA: Diagnosis not present

## 2017-05-19 DIAGNOSIS — E1122 Type 2 diabetes mellitus with diabetic chronic kidney disease: Secondary | ICD-10-CM | POA: Diagnosis not present

## 2017-05-19 DIAGNOSIS — Z992 Dependence on renal dialysis: Secondary | ICD-10-CM | POA: Diagnosis not present

## 2017-05-19 DIAGNOSIS — D631 Anemia in chronic kidney disease: Secondary | ICD-10-CM | POA: Diagnosis not present

## 2017-05-19 DIAGNOSIS — E1129 Type 2 diabetes mellitus with other diabetic kidney complication: Secondary | ICD-10-CM | POA: Diagnosis not present

## 2017-05-19 MED FILL — CALCITRIOL 0.25 MCG CAPS: 0.25 | 30 days supply | Qty: 30 | Fill #6

## 2017-05-19 MED FILL — ?FUROSEMIDE 80MG TABLET: 80 | 30 days supply | Qty: 120 | Fill #1

## 2017-05-21 ENCOUNTER — Telehealth: Payer: Self-pay

## 2017-05-21 ENCOUNTER — Telehealth: Payer: Self-pay | Admitting: Vascular Surgery

## 2017-05-21 NOTE — Telephone Encounter (Signed)
Patient came to the clinic today accompanied by his wife.  He requested assistance with completing a [employer] application. He noted that he received it 2-3 weeks ago and stated that it needs to be sent to [employer] by this afternoon because he needs transportation to dialysis tomorrow morning.   The application was completed and faxed to [employer] eligibility. This CM explained to the patient and his wife that the application will not be reviewed/approved by [employer] by tomorrow morning and this CM is not able to tell them when the [employer]AT application will be reviewed. and he will need to make arrangements for transportation to dialysis.  His wife stated that she works and is not able to drive him.  This CM offered him bus passes but he said that the bus stop is too far from his home so he would ask friends for assistance with driving him to dialysis.  This CM instructed him to discuss the status of the [employer] services with the social worker at dialysis and he stated that he would.  Attempted to contact dialysis - [facility] # [phone] to provide an update on the patient's need for transportation but it was after 1700 and the office was closed. This information was shared with the patient and his wife.

## 2017-05-21 NOTE — Telephone Encounter (Signed)
Sched appt 06/20/17 at 3:15. Pt has no vm, mailed letter.

## 2017-05-21 NOTE — Telephone Encounter (Signed)
-----   Message from Margy Clarks, LPN sent at 7/0/4888  2:20 PM EST ----- Regarding: FW: post op Please schedule this f/u apt.  Thank you Russell Price ----- Message ----- From: Mena Goes, RN Sent: 05/21/2017   9:56 AM To: Margy Clarks, LPN Subject: RE: post op                                    This was sent 05-07-17 to Adm Ceferino Lang  [phone]  February 25, 1961   05/07/2017  Pre-operative Diagnosis: ersd, primary malfunction of left arm avf   Surgeon: Eda Paschal. Donzetta Matters, MD   Procedure Performed:  1. US guided cannulation of left arm avf  2. fistulogram left upper arm avf  3. Balloon angioplasty of left arm basilic vein fistula with 6 and 82mm balloons   F/u in 4 weeks with dialysis access duplex    ----- Message ----- From: Margy Clarks, LPN Sent: 12/26/2798   9:48 AM To: Mena Goes, RN Subject: post op                                        Russell Price,  Does this pt need a post op apt? I can not find a staff message.  Thanks

## 2017-05-22 DIAGNOSIS — N186 End stage renal disease: Secondary | ICD-10-CM | POA: Diagnosis not present

## 2017-05-22 DIAGNOSIS — Z23 Encounter for immunization: Secondary | ICD-10-CM | POA: Diagnosis not present

## 2017-05-22 DIAGNOSIS — N2581 Secondary hyperparathyroidism of renal origin: Secondary | ICD-10-CM | POA: Diagnosis not present

## 2017-05-23 ENCOUNTER — Telehealth: Payer: Self-pay | Admitting: Family Medicine

## 2017-05-23 NOTE — Telephone Encounter (Signed)
Call placed to Raliegh Scarlet from [facility] [phone] to check on the status of patient's application. Shirlee Limerick informed me that patient's application was received this week and they will contact patient to schedule an assessment.

## 2017-05-24 DIAGNOSIS — N186 End stage renal disease: Secondary | ICD-10-CM | POA: Diagnosis not present

## 2017-05-24 DIAGNOSIS — Z23 Encounter for immunization: Secondary | ICD-10-CM | POA: Diagnosis not present

## 2017-05-24 DIAGNOSIS — N2581 Secondary hyperparathyroidism of renal origin: Secondary | ICD-10-CM | POA: Diagnosis not present

## 2017-05-27 DIAGNOSIS — Z23 Encounter for immunization: Secondary | ICD-10-CM | POA: Diagnosis not present

## 2017-05-27 DIAGNOSIS — N2581 Secondary hyperparathyroidism of renal origin: Secondary | ICD-10-CM | POA: Diagnosis not present

## 2017-05-27 DIAGNOSIS — N186 End stage renal disease: Secondary | ICD-10-CM | POA: Diagnosis not present

## 2017-05-29 DIAGNOSIS — N2581 Secondary hyperparathyroidism of renal origin: Secondary | ICD-10-CM | POA: Diagnosis not present

## 2017-05-29 DIAGNOSIS — Z23 Encounter for immunization: Secondary | ICD-10-CM | POA: Diagnosis not present

## 2017-05-29 DIAGNOSIS — N186 End stage renal disease: Secondary | ICD-10-CM | POA: Diagnosis not present

## 2017-05-31 DIAGNOSIS — N186 End stage renal disease: Secondary | ICD-10-CM | POA: Diagnosis not present

## 2017-05-31 DIAGNOSIS — N2581 Secondary hyperparathyroidism of renal origin: Secondary | ICD-10-CM | POA: Diagnosis not present

## 2017-05-31 DIAGNOSIS — Z23 Encounter for immunization: Secondary | ICD-10-CM | POA: Diagnosis not present

## 2017-06-03 DIAGNOSIS — Z23 Encounter for immunization: Secondary | ICD-10-CM | POA: Diagnosis not present

## 2017-06-03 DIAGNOSIS — N2581 Secondary hyperparathyroidism of renal origin: Secondary | ICD-10-CM | POA: Diagnosis not present

## 2017-06-03 DIAGNOSIS — N186 End stage renal disease: Secondary | ICD-10-CM | POA: Diagnosis not present

## 2017-06-05 DIAGNOSIS — N2581 Secondary hyperparathyroidism of renal origin: Secondary | ICD-10-CM | POA: Diagnosis not present

## 2017-06-05 DIAGNOSIS — Z23 Encounter for immunization: Secondary | ICD-10-CM | POA: Diagnosis not present

## 2017-06-05 DIAGNOSIS — N186 End stage renal disease: Secondary | ICD-10-CM | POA: Diagnosis not present

## 2017-06-06 ENCOUNTER — Encounter: Payer: Self-pay | Admitting: *Deleted

## 2017-06-06 ENCOUNTER — Ambulatory Visit: Payer: Self-pay | Attending: Family Medicine

## 2017-06-07 DIAGNOSIS — Z23 Encounter for immunization: Secondary | ICD-10-CM | POA: Diagnosis not present

## 2017-06-07 DIAGNOSIS — N2581 Secondary hyperparathyroidism of renal origin: Secondary | ICD-10-CM | POA: Diagnosis not present

## 2017-06-07 DIAGNOSIS — N186 End stage renal disease: Secondary | ICD-10-CM | POA: Diagnosis not present

## 2017-06-10 DIAGNOSIS — N2581 Secondary hyperparathyroidism of renal origin: Secondary | ICD-10-CM | POA: Diagnosis not present

## 2017-06-10 DIAGNOSIS — N186 End stage renal disease: Secondary | ICD-10-CM | POA: Diagnosis not present

## 2017-06-10 DIAGNOSIS — Z23 Encounter for immunization: Secondary | ICD-10-CM | POA: Diagnosis not present

## 2017-06-12 DIAGNOSIS — N186 End stage renal disease: Secondary | ICD-10-CM | POA: Diagnosis not present

## 2017-06-12 DIAGNOSIS — N2581 Secondary hyperparathyroidism of renal origin: Secondary | ICD-10-CM | POA: Diagnosis not present

## 2017-06-12 DIAGNOSIS — Z23 Encounter for immunization: Secondary | ICD-10-CM | POA: Diagnosis not present

## 2017-06-13 ENCOUNTER — Ambulatory Visit: Payer: Medicaid Other | Admitting: Family Medicine

## 2017-06-14 DIAGNOSIS — N186 End stage renal disease: Secondary | ICD-10-CM | POA: Diagnosis not present

## 2017-06-14 DIAGNOSIS — Z23 Encounter for immunization: Secondary | ICD-10-CM | POA: Diagnosis not present

## 2017-06-14 DIAGNOSIS — N2581 Secondary hyperparathyroidism of renal origin: Secondary | ICD-10-CM | POA: Diagnosis not present

## 2017-06-16 ENCOUNTER — Ambulatory Visit: Payer: Medicare Other | Attending: Family Medicine | Admitting: Family Medicine

## 2017-06-16 ENCOUNTER — Encounter: Payer: Self-pay | Admitting: Family Medicine

## 2017-06-16 ENCOUNTER — Telehealth: Payer: Self-pay

## 2017-06-16 VITALS — BP 139/74 | HR 51 | Temp 97.7°F | Ht 71.0 in | Wt 150.0 lb

## 2017-06-16 DIAGNOSIS — E1141 Type 2 diabetes mellitus with diabetic mononeuropathy: Secondary | ICD-10-CM | POA: Insufficient documentation

## 2017-06-16 DIAGNOSIS — N186 End stage renal disease: Secondary | ICD-10-CM | POA: Diagnosis not present

## 2017-06-16 DIAGNOSIS — H409 Unspecified glaucoma: Secondary | ICD-10-CM | POA: Insufficient documentation

## 2017-06-16 DIAGNOSIS — E7800 Pure hypercholesterolemia, unspecified: Secondary | ICD-10-CM | POA: Insufficient documentation

## 2017-06-16 DIAGNOSIS — K089 Disorder of teeth and supporting structures, unspecified: Secondary | ICD-10-CM | POA: Insufficient documentation

## 2017-06-16 DIAGNOSIS — G8929 Other chronic pain: Secondary | ICD-10-CM

## 2017-06-16 DIAGNOSIS — I120 Hypertensive chronic kidney disease with stage 5 chronic kidney disease or end stage renal disease: Secondary | ICD-10-CM | POA: Diagnosis not present

## 2017-06-16 DIAGNOSIS — H5440 Blindness, one eye, unspecified eye: Secondary | ICD-10-CM | POA: Diagnosis not present

## 2017-06-16 DIAGNOSIS — Z8673 Personal history of transient ischemic attack (TIA), and cerebral infarction without residual deficits: Secondary | ICD-10-CM | POA: Insufficient documentation

## 2017-06-16 DIAGNOSIS — Z7982 Long term (current) use of aspirin: Secondary | ICD-10-CM | POA: Insufficient documentation

## 2017-06-16 DIAGNOSIS — I953 Hypotension of hemodialysis: Secondary | ICD-10-CM | POA: Insufficient documentation

## 2017-06-16 DIAGNOSIS — E0841 Diabetes mellitus due to underlying condition with diabetic mononeuropathy: Secondary | ICD-10-CM | POA: Diagnosis not present

## 2017-06-16 DIAGNOSIS — Z79899 Other long term (current) drug therapy: Secondary | ICD-10-CM | POA: Insufficient documentation

## 2017-06-16 DIAGNOSIS — E1122 Type 2 diabetes mellitus with diabetic chronic kidney disease: Secondary | ICD-10-CM | POA: Diagnosis not present

## 2017-06-16 DIAGNOSIS — H5461 Unqualified visual loss, right eye, normal vision left eye: Secondary | ICD-10-CM | POA: Diagnosis not present

## 2017-06-16 DIAGNOSIS — I10 Essential (primary) hypertension: Secondary | ICD-10-CM

## 2017-06-16 DIAGNOSIS — Z9889 Other specified postprocedural states: Secondary | ICD-10-CM | POA: Diagnosis not present

## 2017-06-16 DIAGNOSIS — E118 Type 2 diabetes mellitus with unspecified complications: Secondary | ICD-10-CM | POA: Diagnosis not present

## 2017-06-16 DIAGNOSIS — I2510 Atherosclerotic heart disease of native coronary artery without angina pectoris: Secondary | ICD-10-CM | POA: Insufficient documentation

## 2017-06-16 DIAGNOSIS — Z794 Long term (current) use of insulin: Secondary | ICD-10-CM | POA: Insufficient documentation

## 2017-06-16 DIAGNOSIS — Z955 Presence of coronary angioplasty implant and graft: Secondary | ICD-10-CM | POA: Diagnosis not present

## 2017-06-16 DIAGNOSIS — Z992 Dependence on renal dialysis: Secondary | ICD-10-CM | POA: Insufficient documentation

## 2017-06-16 LAB — POCT GLYCOSYLATED HEMOGLOBIN (HGB A1C): Hemoglobin A1C: 9.1

## 2017-06-16 LAB — GLUCOSE, POCT (MANUAL RESULT ENTRY): POC Glucose: 204 mg/dl — AB (ref 70–99)

## 2017-06-16 MED ORDER — CARVEDILOL 6.25 MG PO TABS: 12.5000 mg | ORAL_TABLET | Freq: Two times a day (BID) | ORAL | 5 refills | Status: DC

## 2017-06-16 MED ORDER — AMLODIPINE BESYLATE 2.5 MG PO TABS: 2.5000 mg | ORAL_TABLET | Freq: Every day | ORAL | 5 refills | Status: DC

## 2017-06-16 MED ORDER — ACCU-CHEK SOFTCLIX LANCETS MISC: 12 refills | Status: DC

## 2017-06-16 MED ORDER — GABAPENTIN 300 MG PO CAPS: 300.0000 mg | ORAL_CAPSULE | Freq: Every day | ORAL | 5 refills | Status: DC

## 2017-06-16 MED ORDER — GLUCOSE BLOOD VI STRP: ORAL_STRIP | 12 refills | Status: DC

## 2017-06-16 MED ORDER — FUROSEMIDE 80 MG PO TABS: 80.0000 mg | ORAL_TABLET | Freq: Two times a day (BID) | ORAL | 3 refills | Status: DC

## 2017-06-16 MED ORDER — ATORVASTATIN CALCIUM 40 MG PO TABS: 40.0000 mg | ORAL_TABLET | Freq: Every day | ORAL | 5 refills | Status: DC

## 2017-06-16 MED ORDER — INSULIN GLARGINE 100 UNIT/ML ~~LOC~~ SOLN: SUBCUTANEOUS | 5 refills | Status: DC

## 2017-06-16 MED FILL — FUROSEMIDE 80 MG TAB: 80 | 30 days supply | Qty: 60 | Fill #0

## 2017-06-16 MED FILL — GABAPENTIN 300 MG CAPSULE: 300 | 30 days supply | Qty: 30 | Fill #3

## 2017-06-16 MED FILL — CALCITRIOL 0.25 MCG CAPS: 0.25 | 30 days supply | Qty: 30 | Fill #0

## 2017-06-16 MED FILL — LANTUS 100 UNITS/ML VIAL: 100 | 28 days supply | Qty: 10 | Fill #0

## 2017-06-16 MED FILL — AMLODIPINE BESYLATE 2.5 MG: 2.5 | 30 days supply | Qty: 30 | Fill #0

## 2017-06-16 MED FILL — CARVEDILOL 6.25 MG TABLET: 6.25 | 15 days supply | Qty: 60 | Fill #0

## 2017-06-16 MED FILL — ATORVASTATIN 40 MG TABLET: 40 | 30 days supply | Qty: 30 | Fill #3

## 2017-06-16 NOTE — Patient Instructions (Signed)
Diabetes Mellitus and Nutrition When you have diabetes (diabetes mellitus), it is very important to have healthy eating habits because your blood sugar (glucose) levels are greatly affected by what you eat and drink. Eating healthy foods in the appropriate amounts, at about the same times every day, can help you:  Control your blood glucose.  Lower your risk of heart disease.  Improve your blood pressure.  Reach or maintain a healthy weight.  Every person with diabetes is different, and each person has different needs for a meal plan. Your health care provider may recommend that you work with a diet and nutrition specialist (dietitian) to make a meal plan that is best for you. Your meal plan may vary depending on factors such as:  The calories you need.  The medicines you take.  Your weight.  Your blood glucose, blood pressure, and cholesterol levels.  Your activity level.  Other health conditions you have, such as heart or kidney disease.  How do carbohydrates affect me? Carbohydrates affect your blood glucose level more than any other type of food. Eating carbohydrates naturally increases the amount of glucose in your blood. Carbohydrate counting is a method for keeping track of how many carbohydrates you eat. Counting carbohydrates is important to keep your blood glucose at a healthy level, especially if you use insulin or take certain oral diabetes medicines. It is important to know how many carbohydrates you can safely have in each meal. This is different for every person. Your dietitian can help you calculate how many carbohydrates you should have at each meal and for snack. Foods that contain carbohydrates include:  Bread, cereal, rice, pasta, and crackers.  Potatoes and corn.  Peas, beans, and lentils.  Milk and yogurt.  Fruit and juice.  Desserts, such as cakes, cookies, ice cream, and candy.  How does alcohol affect me? Alcohol can cause a sudden decrease in blood  glucose (hypoglycemia), especially if you use insulin or take certain oral diabetes medicines. Hypoglycemia can be a life-threatening condition. Symptoms of hypoglycemia (sleepiness, dizziness, and confusion) are similar to symptoms of having too much alcohol. If your health care provider says that alcohol is safe for you, follow these guidelines:  Limit alcohol intake to no more than 1 drink per day for nonpregnant women and 2 drinks per day for men. One drink equals 12 oz of beer, 5 oz of wine, or 1 oz of hard liquor.  Do not drink on an empty stomach.  Keep yourself hydrated with water, diet soda, or unsweetened iced tea.  Keep in mind that regular soda, juice, and other mixers may contain a lot of sugar and must be counted as carbohydrates.  What are tips for following this plan? Reading food labels  Start by checking the serving size on the label. The amount of calories, carbohydrates, fats, and other nutrients listed on the label are based on one serving of the food. Many foods contain more than one serving per package.  Check the total grams (g) of carbohydrates in one serving. You can calculate the number of servings of carbohydrates in one serving by dividing the total carbohydrates by 15. For example, if a food has 30 g of total carbohydrates, it would be equal to 2 servings of carbohydrates.  Check the number of grams (g) of saturated and trans fats in one serving. Choose foods that have low or no amount of these fats.  Check the number of milligrams (mg) of sodium in one serving. Most people   should limit total sodium intake to less than 2,300 mg per day.  Always check the nutrition information of foods labeled as "low-fat" or "nonfat". These foods may be higher in added sugar or refined carbohydrates and should be avoided.  Talk to your dietitian to identify your daily goals for nutrients listed on the label. Shopping  Avoid buying canned, premade, or processed foods. These  foods tend to be high in fat, sodium, and added sugar.  Shop around the outside edge of the grocery store. This includes fresh fruits and vegetables, bulk grains, fresh meats, and fresh dairy. Cooking  Use low-heat cooking methods, such as baking, instead of high-heat cooking methods like deep frying.  Cook using healthy oils, such as olive, canola, or sunflower oil.  Avoid cooking with butter, cream, or high-fat meats. Meal planning  Eat meals and snacks regularly, preferably at the same times every day. Avoid going long periods of time without eating.  Eat foods high in fiber, such as fresh fruits, vegetables, beans, and whole grains. Talk to your dietitian about how many servings of carbohydrates you can eat at each meal.  Eat 4-6 ounces of lean protein each day, such as lean meat, chicken, fish, eggs, or tofu. 1 ounce is equal to 1 ounce of meat, chicken, or fish, 1 egg, or 1/4 cup of tofu.  Eat some foods each day that contain healthy fats, such as avocado, nuts, seeds, and fish. Lifestyle   Check your blood glucose regularly.  Exercise at least 30 minutes 5 or more days each week, or as told by your health care provider.  Take medicines as told by your health care provider.  Do not use any products that contain nicotine or tobacco, such as cigarettes and e-cigarettes. If you need help quitting, ask your health care provider.  Work with a counselor or diabetes educator to identify strategies to manage stress and any emotional and social challenges. What are some questions to ask my health care provider?  Do I need to meet with a diabetes educator?  Do I need to meet with a dietitian?  What number can I call if I have questions?  When are the best times to check my blood glucose? Where to find more information:  American Diabetes Association: diabetes.org/food-and-fitness/food  Academy of Nutrition and Dietetics:  www.eatright.org/resources/health/diseases-and-conditions/diabetes  National Institute of Diabetes and Digestive and Kidney Diseases (NIH): www.niddk.nih.gov/health-information/diabetes/overview/diet-eating-physical-activity Summary  A healthy meal plan will help you control your blood glucose and maintain a healthy lifestyle.  Working with a diet and nutrition specialist (dietitian) can help you make a meal plan that is best for you.  Keep in mind that carbohydrates and alcohol have immediate effects on your blood glucose levels. It is important to count carbohydrates and to use alcohol carefully. This information is not intended to replace advice given to you by your health care provider. Make sure you discuss any questions you have with your health care provider. Document Released: 01/31/2005 Document Revised: 06/10/2016 Document Reviewed: 06/10/2016 Elsevier Interactive Patient Education  2018 [employer]  

## 2017-06-16 NOTE — Progress Notes (Signed)
Subjective:  Patient ID: Russell Price, male    DOB: January 03, 1961  Age: 57 y.o. MRN: [id number]  CC: Diabetes  HPI Russell Price is a 57 year old male with a PMH of T2DM, HTN, and ESRD on HD that presents for follow-up.  He reports adherence to medications, denies hypoglycemic events, and denies numbness/tingling of extremities. Denies visual changes and reports following-up with ophthalmology. Fasting blood sugars range from 75-100s while evening blood sugars are in the 200s. This morning his blood sugar was 75.   He has not been checking his blood pressure at home but reports episodes of hypotension during dialysis. Denies chest pain, SOB, palpitations, dizziness or headaches.  He attends dialysis T/TH/S. He continues to have issues with transportation to dialysis. He is going through the process of being placed on the kidney transplant list and needs a Dental exam before February 11.   Past Medical History:  Diagnosis Date  . CAD (coronary artery disease)    a. underwent cath in 02/2016 after an abnormal nuc which showed 2V CAD in RCA and OM3 with no good targets for PCI. Medical managment recommended.   . Chronic kidney disease   . Diabetes mellitus without complication ([place])    type II - followed by Dr Chalmers Cater   . Difficult intubation    ER intubation 2016  . ESRD (end stage renal disease) ([place])    Tues, Th, Sat [place]  . Glaucoma   . Hemodialysis patient [facility])    has been on dialysis since approx 10/2016   . Hypertension    followed by [facility]  . Stroke [facility])    patient denies on 02/19/2017    Past Surgical History:  Procedure Laterality Date  . A/V FISTULAGRAM Left 11/27/2016   Procedure: A/V Fistulagram;  Surgeon: Waynetta Sandy, MD;  Location: [place] [facility];  Service: Cardiovascular;  Laterality: Left;  . A/V FISTULAGRAM Left 05/07/2017   Procedure: A/V FISTULAGRAM;  Surgeon: Waynetta Sandy, MD;  Location: [place] [facility];  Service: Cardiovascular;  Laterality: Left;  . AV FISTULA PLACEMENT Left 03/12/2016   Procedure: Left Arm ARTERIOVENOUS (AV) FISTULA CREATION;  Surgeon: Waynetta Sandy, MD;  Location: [place];  Service: Vascular;  Laterality: Left;  . BASCILIC VEIN TRANSPOSITION Left 05/14/2016   Procedure: SECOND STAGE LEFT BASILIC VEIN TRANSPOSITION;  Surgeon: Waynetta Sandy, MD;  Location: [place];  Service: Vascular;  Laterality: Left;  . BRONCHOSCOPY  02/14/2015   for pulm hemorrhage  . CARDIAC CATHETERIZATION N/A 03/04/2016   Procedure: Left Heart Cath and Coronary Angiography;  Surgeon: Leonie Man, MD;  Location: [place] [facility];  Service: Cardiovascular;  Laterality: N/A;  . COLONOSCOPY WITH PROPOFOL N/A 04/07/2017   Procedure: COLONOSCOPY WITH PROPOFOL;  Surgeon: Ronnette Juniper, MD;  Location: [place];  Service: Gastroenterology;  Laterality: N/A;  . EYE SURGERY Left   . INSERTION OF DIALYSIS CATHETER Right 11/08/2016   Procedure: INSERTION OF DIALYSIS CATHETER;  Surgeon: Rosetta Posner, MD;  Location: [place];  Service: Vascular;  Laterality: Right;  . IR PARACENTESIS  11/13/2016  . PERIPHERAL VASCULAR BALLOON ANGIOPLASTY Left 11/27/2016   Procedure: Peripheral Vascular Balloon Angioplasty;  Surgeon: Waynetta Sandy, MD;  Location: [place] [facility];  Service: Cardiovascular;  Laterality: Left;  ARM FISTULA  . PERIPHERAL VASCULAR BALLOON ANGIOPLASTY Left 05/07/2017   Procedure: PERIPHERAL VASCULAR BALLOON ANGIOPLASTY;  Surgeon: Waynetta Sandy, MD;  Location: [place] [facility];  Service: Cardiovascular;  Laterality: Left;   No Known Allergies   Outpatient Medications Prior to Visit  Medication Sig Dispense Refill  . aspirin 81 MG chewable tablet Chew 1 tablet (81 mg total) by mouth daily. (Patient taking differently: Chew 81 mg by mouth every evening. )    . Blood Glucose Monitoring Suppl (ACCU-CHEK AVIVA) device Use as instructed 3 times  daily before meals and at bedtime. E11.9 1 each 0  . Blood Glucose Monitoring Suppl (TRUE METRIX METER) DEVI 1 each by Does not apply route 3 (three) times daily. 1 Device 0  . calcitRIOL (ROCALTROL) 0.25 MCG capsule Take 0.25 mcg by mouth daily.  6  . insulin aspart (NOVOLOG) 100 UNIT/ML injection 0-12 units 3 times daily before meals as well as per sliding scale 10 mL 5  . Lancet Devices (ACCU-CHEK SOFTCLIX) lancets Use as instructed 3 times daily before meals and at bedtime. E11.9 1 each 12  . multivitamin (RENA-VIT) TABS tablet Take 1 tablet by mouth daily.    . Nutritional Supplements (FEEDING SUPPLEMENT, NEPRO CARB STEADY,) LIQD Take 237 mLs by mouth 2 (two) times daily between meals. 60 Can 0  . ondansetron (ZOFRAN) 4 MG tablet Take 1 tablet (4 mg total) by mouth every 8 (eight) hours as needed for nausea or vomiting. 30 tablet 1  . polyethylene glycol (MIRALAX / GLYCOLAX) packet Take 17 g by mouth daily. 14 each 0  . ACCU-CHEK SOFTCLIX LANCETS lancets Use as instructed 3 times daily before meals and at bedtime. E11.9 100 each 12  . atorvastatin (LIPITOR) 40 MG tablet Take 1 tablet (40 mg total) by mouth daily at 6 PM. 30 tablet 5  . carvedilol (COREG) 12.5 MG tablet Take 1 tablet (12.5 mg total) by mouth 2 (two) times daily with a meal. 60 tablet 5  . furosemide (LASIX) 80 MG tablet Take 80 mg by mouth 2 (two) times daily.    Marland Kitchen gabapentin (NEURONTIN) 300 MG capsule Take 1 capsule (300 mg total) by mouth daily. 30 capsule 5  . glucose blood (ACCU-CHEK AVIVA PLUS) test strip Use 3 times daily before meals and at bedtime. E11.9 100 each 12  . insulin glargine (LANTUS) 100 UNIT/ML injection Inject 0.15 mLs (15 Units total) into the skin at bedtime. 10 mL 5  . Darbepoetin Alfa (ARANESP) 150 MCG/0.3ML SOSY injection Inject 0.3 mLs (150 mcg total) into the vein every Saturday with hemodialysis. (Patient not taking: Reported on 06/16/2017) 1.68 mL 0  . hydrocortisone (ANUSOL-HC) 2.5 % rectal cream  Place rectally 2 (two) times daily. (Patient not taking: Reported on 06/16/2017) 30 g 0   No facility-administered medications prior to visit.     ROS Review of Systems  Constitutional: Negative for activity change.  Eyes:       Reports blindness of right eye.  Respiratory: Negative for cough and shortness of breath.   Cardiovascular: Negative for chest pain, palpitations and leg swelling.  Gastrointestinal: Negative for constipation, diarrhea and nausea.  Musculoskeletal: Negative for myalgias.  Neurological: Negative for dizziness, syncope, numbness and headaches.  Psychiatric/Behavioral: Negative.     Objective:  BP 139/74   Pulse (!) 51   Temp 97.7 F (36.5 C) (Oral)   Ht 5\' 11"  (1.803 m)   Wt 150 lb (68 kg)   SpO2 100%   BMI 20.92 kg/m   BP/Weight 06/16/2017 05/07/2017 65/07/5463  Systolic BP 681 275 170  Diastolic BP 74 98 70  Wt. (Lbs) 150 138.89 153  BMI 20.92 19.37  21.34   Lab Results  Component Value Date   HGBA1C 9.1 06/16/2017    CMP Latest Ref Rng & Units 05/07/2017 04/07/2017 04/07/2017  Glucose 65 - 99 mg/dL 176(H) 126(H) 134(H)  BUN 6 - 20 mg/dL 37(H) - -  Creatinine 0.61 - 1.24 mg/dL 5.20(H) - -  Sodium 135 - 145 mmol/L 137 137 132(L)  Potassium 3.5 - 5.1 mmol/L 4.0 4.6 6.1(H)  Chloride 101 - 111 mmol/L 100(L) - -  CO2 22 - 32 mmol/L - - -  Calcium 8.9 - 10.3 mg/dL - - -  Total Protein 6.5 - 8.1 g/dL - - -  Total Bilirubin 0.3 - 1.2 mg/dL - - -  Alkaline Phos 38 - 126 U/L - - -  AST 15 - 41 U/L - - -  ALT 17 - 63 U/L - - -    Physical Exam  Constitutional: He is oriented to person, place, and time. He appears well-developed and well-nourished. No distress.  HENT:  Head: Normocephalic and atraumatic.  Mouth/Throat: No oropharyngeal exudate.  Neck: Normal range of motion.  Cardiovascular: Regular rhythm, normal heart sounds and intact distal pulses.  No murmur heard. Pulses:      Dorsalis pedis pulses are 2+ on the right side, and 2+ on the  left side.  Bradycardia  Pulmonary/Chest: Effort normal and breath sounds normal. No respiratory distress. He exhibits no tenderness.  Abdominal: Soft. Bowel sounds are normal.  Musculoskeletal: Normal range of motion. He exhibits no edema.  Foot Exam reveals sensation intact throughout bilateral feet.   Neurological: He is alert and oriented to person, place, and time.  Skin: Skin is warm and dry.  Psychiatric: He has a normal mood and affect. His behavior is normal.     Assessment & Plan:   1. DM (diabetes mellitus) with complications ([place]) Uncontrolled with Hb A1C at 9.1 - POCT glucose (manual entry) - POCT glycosylated hemoglobin (Hb A1C) Continue Lantus 15 units at night, added 10 units of Lantus in the morning. Stop sliding scale for now. - insulin glargine (LANTUS) 100 UNIT/ML injection; Inject subcutaneously 10 units of Lantus in the morning and 15 units of Lantus in the evening  Dispense: 30 mL; Refill: 5  2. Essential hypertension Controlled, decreased carvedilol due to bradycardia. Added amlodipine 2.5mg  daily. - carvedilol (COREG) 6.25 MG tablet; Take 2 tablets (12.5 mg total) by mouth 2 (two) times daily with a meal.  Dispense: 60 tablet; Refill: 5  3. ESRD (end stage renal disease) ([place]) Continue Hemodialysis T/TH/S. Case Manager Opal Sidles notified of ongoing transportation issues.  4. Diabetic mononeuropathy associated with diabetes mellitus due to underlying condition ([place]) Continue medication regimen - gabapentin (NEURONTIN) 300 MG capsule; Take 1 capsule (300 mg total) by mouth daily.  Dispense: 30 capsule; Refill: 5  5. Pure hypercholesterolemia Continue medication regimen - atorvastatin (LIPITOR) 40 MG tablet; Take 1 tablet (40 mg total) by mouth daily at 6 PM.  Dispense: 30 tablet; Refill: 5  6. Chronic dental pain - Ambulatory referral to Dentistry Needed for kidney transplant application.   7. Blind right eye Continue to follow-up with ophthalmology.     Meds ordered this encounter  Medications  . insulin glargine (LANTUS) 100 UNIT/ML injection    Sig: Inject subcutaneously 10 units of Lantus in the morning and 15 units of Lantus in the evening    Dispense:  30 mL    Refill:  5  . amLODipine (NORVASC) 2.5 MG tablet    Sig: Take 1  tablet (2.5 mg total) by mouth daily.    Dispense:  30 tablet    Refill:  5  . gabapentin (NEURONTIN) 300 MG capsule    Sig: Take 1 capsule (300 mg total) by mouth daily.    Dispense:  30 capsule    Refill:  5  . furosemide (LASIX) 80 MG tablet    Sig: Take 1 tablet (80 mg total) by mouth 2 (two) times daily.    Dispense:  60 tablet    Refill:  3  . carvedilol (COREG) 6.25 MG tablet    Sig: Take 2 tablets (12.5 mg total) by mouth 2 (two) times daily with a meal.    Dispense:  60 tablet    Refill:  5    Discontinue previous dose  . atorvastatin (LIPITOR) 40 MG tablet    Sig: Take 1 tablet (40 mg total) by mouth daily at 6 PM.    Dispense:  30 tablet    Refill:  5  . glucose blood (ACCU-CHEK AVIVA PLUS) test strip    Sig: Use 3 times daily before meals and at bedtime. E11.9    Dispense:  100 each    Refill:  12    E11.9  . ACCU-CHEK SOFTCLIX LANCETS lancets    Sig: Use as instructed 3 times daily before meals and at bedtime. E11.9    Dispense:  100 each    Refill:  12    Follow-up: Return in about 3 months (around 09/14/2017) for Follow up of diabetes mellitus.

## 2017-06-16 NOTE — Telephone Encounter (Signed)
Met with the patient and his wife at the request of Dr Newlin. They explained that he was accepted by [facility] but they are not able to afford the cost of rides  - $1.50 each way and he attends dialysis 3 times a week. He stated that he used to have medicaid transportation but is no longer eligible for full  Medicaid and can't get medicaid transportation.  He provided this CM with his current medicaid card - ID # [id number]. As per Quianna, [facility] scheduler , [place] [employer] shows him only eligible for [place] medicaid .  He is currently using [facility] transportation to /from dialysis as his wife works and is unable to transport him.   His wife also expressed concerns about the cost of glucometer test strips. Explained to her that these are covered by his medicare part B and [facility] is not able to bill medicare part B, so they will need to use an outside pharmacy for this product.  This CM also explained that the patient may be eligible for the Blue Card for medication assistance. He does not have medicare part D and his wife expressed concerns about medication costs. Instructed her to schedule an appointment with Financial Counseling at [facility] to complete a Blue Card application. In addition, this CM provided her with the phone # for the local [facility]II[facility] counselor  - [employer] [facility] [place] - # [phone] and encouraged her to call to discuss insurance options for medication coverage.   She was very appreciative of the information and stated that she would call the SHIIP counselor and schedule the appointment for the Blue Card.  

## 2017-06-17 ENCOUNTER — Telehealth: Payer: Self-pay

## 2017-06-17 DIAGNOSIS — N2581 Secondary hyperparathyroidism of renal origin: Secondary | ICD-10-CM | POA: Diagnosis not present

## 2017-06-17 DIAGNOSIS — N186 End stage renal disease: Secondary | ICD-10-CM | POA: Diagnosis not present

## 2017-06-17 DIAGNOSIS — Z23 Encounter for immunization: Secondary | ICD-10-CM | POA: Diagnosis not present

## 2017-06-17 NOTE — Telephone Encounter (Signed)
Correction to prior note, DSS worker was Ms Zenia Resides, not Ms harris

## 2017-06-17 NOTE — Telephone Encounter (Signed)
Call placed to DSS to clarify the patient's medicaid status. Spoke to Ms Kenton Kingfisher who stated that he has [facility] which only pays his medicare premium, he is over income for full medicaid.  He does not qualify for medicaid transportation.

## 2017-06-19 DIAGNOSIS — N186 End stage renal disease: Secondary | ICD-10-CM | POA: Diagnosis not present

## 2017-06-19 DIAGNOSIS — Z23 Encounter for immunization: Secondary | ICD-10-CM | POA: Diagnosis not present

## 2017-06-19 DIAGNOSIS — Z992 Dependence on renal dialysis: Secondary | ICD-10-CM | POA: Diagnosis not present

## 2017-06-19 DIAGNOSIS — E1122 Type 2 diabetes mellitus with diabetic chronic kidney disease: Secondary | ICD-10-CM | POA: Diagnosis not present

## 2017-06-19 DIAGNOSIS — N2581 Secondary hyperparathyroidism of renal origin: Secondary | ICD-10-CM | POA: Diagnosis not present

## 2017-06-20 ENCOUNTER — Encounter: Payer: Self-pay | Admitting: Vascular Surgery

## 2017-06-20 ENCOUNTER — Ambulatory Visit (INDEPENDENT_AMBULATORY_CARE_PROVIDER_SITE_OTHER): Payer: Medicare Other | Admitting: Vascular Surgery

## 2017-06-20 VITALS — BP 167/76 | HR 47 | Resp 20 | Ht 71.0 in | Wt 147.0 lb

## 2017-06-20 DIAGNOSIS — Z992 Dependence on renal dialysis: Secondary | ICD-10-CM

## 2017-06-20 DIAGNOSIS — N186 End stage renal disease: Secondary | ICD-10-CM | POA: Diagnosis not present

## 2017-06-20 DIAGNOSIS — E1122 Type 2 diabetes mellitus with diabetic chronic kidney disease: Secondary | ICD-10-CM | POA: Diagnosis not present

## 2017-06-20 NOTE — Progress Notes (Signed)
Patient ID: Russell Price, male   DOB: 06/24/1960, 57 y.o.   MRN: [id number]  Reason for Consult: Follow-up (4 wk f/u s/p fistulogram)   Referred by Charlott Rakes, MD  Subjective:     HPI:  Russell Price is a 57 y.o. male returns from recent fistulogram after they were pulling clots from his left second stage basilic vein fistula.  He is currently on dialysis via catheter.  They have not use the fistula since the fistulogram.  He is not having any pain in his left arm.  Past Medical History:  Diagnosis Date  . CAD (coronary artery disease)    a. underwent cath in 02/2016 after an abnormal nuc which showed 2V CAD in RCA and OM3 with no good targets for PCI. Medical managment recommended.   . Chronic kidney disease   . Diabetes mellitus without complication ([place])    type II - followed by Dr Chalmers Cater   . Difficult intubation    ER intubation 2016  . ESRD (end stage renal disease) ([place])    Tues, Th, Sat [place]  . Glaucoma   . Hemodialysis patient [facility])    has been on dialysis since approx 10/2016   . Hypertension    followed by [facility]  . Stroke [facility])    patient denies on 02/19/2017    Family History  Problem Relation Age of Onset  . Diabetes Mother    Past Surgical History:  Procedure Laterality Date  . A/V FISTULAGRAM Left 11/27/2016   Procedure: A/V Fistulagram;  Surgeon: Waynetta Sandy, MD;  Location: [place] [facility];  Service: Cardiovascular;  Laterality: Left;  . A/V FISTULAGRAM Left 05/07/2017   Procedure: A/V FISTULAGRAM;  Surgeon: Waynetta Sandy, MD;  Location: [place] [facility];  Service: Cardiovascular;  Laterality: Left;  . AV FISTULA PLACEMENT Left 03/12/2016   Procedure: Left Arm ARTERIOVENOUS (AV) FISTULA CREATION;  Surgeon: Waynetta Sandy, MD;  Location: [place];  Service: Vascular;  Laterality: Left;  . BASCILIC VEIN TRANSPOSITION Left 05/14/2016   Procedure: SECOND STAGE LEFT BASILIC VEIN  TRANSPOSITION;  Surgeon: Waynetta Sandy, MD;  Location: [place];  Service: Vascular;  Laterality: Left;  . BRONCHOSCOPY  02/14/2015   for pulm hemorrhage  . CARDIAC CATHETERIZATION N/A 03/04/2016   Procedure: Left Heart Cath and Coronary Angiography;  Surgeon: Leonie Man, MD;  Location: [place] [facility];  Service: Cardiovascular;  Laterality: N/A;  . COLONOSCOPY WITH PROPOFOL N/A 04/07/2017   Procedure: COLONOSCOPY WITH PROPOFOL;  Surgeon: Ronnette Juniper, MD;  Location: [place];  Service: Gastroenterology;  Laterality: N/A;  . EYE SURGERY Left   . INSERTION OF DIALYSIS CATHETER Right 11/08/2016   Procedure: INSERTION OF DIALYSIS CATHETER;  Surgeon: Rosetta Posner, MD;  Location: [place];  Service: Vascular;  Laterality: Right;  . IR PARACENTESIS  11/13/2016  . PERIPHERAL VASCULAR BALLOON ANGIOPLASTY Left 11/27/2016   Procedure: Peripheral Vascular Balloon Angioplasty;  Surgeon: Waynetta Sandy, MD;  Location: [place] [facility];  Service: Cardiovascular;  Laterality: Left;  ARM FISTULA  . PERIPHERAL VASCULAR BALLOON ANGIOPLASTY Left 05/07/2017   Procedure: PERIPHERAL VASCULAR BALLOON ANGIOPLASTY;  Surgeon: Waynetta Sandy, MD;  Location: [place] [facility];  Service: Cardiovascular;  Laterality: Left;    Short Social History:  Social History   Tobacco Use  . Smoking status: Former Smoker    Packs/day: 0.00    Years: 4.00    Pack years: 0.00  Last attempt to quit: 02/23/1983    Years since quitting: 34.3  . Smokeless tobacco: Never Used  Substance Use Topics  . Alcohol use: No    No Known Allergies  Current Outpatient Medications  Medication Sig Dispense Refill  . ACCU-CHEK SOFTCLIX LANCETS lancets Use as instructed 3 times daily before meals and at bedtime. E11.9 100 each 12  . amLODipine (NORVASC) 2.5 MG tablet Take 1 tablet (2.5 mg total) by mouth daily. 30 tablet 5  . aspirin 81 MG chewable tablet Chew 1 tablet (81 mg total) by mouth daily.  (Patient taking differently: Chew 81 mg by mouth every evening. )    . atorvastatin (LIPITOR) 40 MG tablet Take 1 tablet (40 mg total) by mouth daily at 6 PM. 30 tablet 5  . Blood Glucose Monitoring Suppl (ACCU-CHEK AVIVA) device Use as instructed 3 times daily before meals and at bedtime. E11.9 1 each 0  . Blood Glucose Monitoring Suppl (TRUE METRIX METER) DEVI 1 each by Does not apply route 3 (three) times daily. 1 Device 0  . calcitRIOL (ROCALTROL) 0.25 MCG capsule Take 0.25 mcg by mouth daily.  6  . carvedilol (COREG) 6.25 MG tablet Take 2 tablets (12.5 mg total) by mouth 2 (two) times daily with a meal. 60 tablet 5  . Darbepoetin Alfa (ARANESP) 150 MCG/0.3ML SOSY injection Inject 0.3 mLs (150 mcg total) into the vein every Saturday with hemodialysis. 1.68 mL 0  . furosemide (LASIX) 80 MG tablet Take 1 tablet (80 mg total) by mouth 2 (two) times daily. 60 tablet 3  . gabapentin (NEURONTIN) 300 MG capsule Take 1 capsule (300 mg total) by mouth daily. 30 capsule 5  . glucose blood (ACCU-CHEK AVIVA PLUS) test strip Use 3 times daily before meals and at bedtime. E11.9 100 each 12  . hydrocortisone (ANUSOL-HC) 2.5 % rectal cream Place rectally 2 (two) times daily. 30 g 0  . insulin aspart (NOVOLOG) 100 UNIT/ML injection 0-12 units 3 times daily before meals as well as per sliding scale 10 mL 5  . insulin glargine (LANTUS) 100 UNIT/ML injection Inject subcutaneously 10 units of Lantus in the morning and 15 units of Lantus in the evening 30 mL 5  . Lancet Devices (ACCU-CHEK SOFTCLIX) lancets Use as instructed 3 times daily before meals and at bedtime. E11.9 1 each 12  . multivitamin (RENA-VIT) TABS tablet Take 1 tablet by mouth daily.    . Nutritional Supplements (FEEDING SUPPLEMENT, NEPRO CARB STEADY,) LIQD Take 237 mLs by mouth 2 (two) times daily between meals. 60 Can 0  . ondansetron (ZOFRAN) 4 MG tablet Take 1 tablet (4 mg total) by mouth every 8 (eight) hours as needed for nausea or vomiting. 30  tablet 1  . polyethylene glycol (MIRALAX / GLYCOLAX) packet Take 17 g by mouth daily. 14 each 0   No current facility-administered medications for this visit.     Review of Systems  Constitutional:  Constitutional negative. Musculoskeletal: Musculoskeletal negative.        Objective:  Objective   Vitals:   06/20/17 1512 06/20/17 1514  BP: (!) 169/74 (!) 167/76  Pulse: (!) 47   Resp: 20   SpO2: 100%   Weight: 147 lb (66.7 kg)   Height: 5\' 11"  (1.803 m)    Body mass index is 20.5 kg/m.  Physical Exam  Constitutional: He is oriented to person, place, and time. He appears well-developed.  Pulmonary/Chest: Effort normal.  Musculoskeletal:  Left upper arm avf with pulsatility  Neurological:  He is alert and oriented to person, place, and time.  Skin: Skin is warm and dry.  Psychiatric: He has a normal mood and affect. His behavior is normal. Judgment and thought content normal.    Data: No new studies     Assessment/Plan:     57 year old male follows up after fistulogram with balloon angioplasty of his left upper arm basilic vein with 6 9 mm balloons.  This is not been used since that time.  Does have pulsatility in there is no central stenosis before.  It does appear to be of adequate size to use and I think it is okay to start using for dialysis.  Should he have further issues we would need to proceed with another fistulogram and possibly ultimately abandoned this fistula for graft or contralateral arm fistula.  He demonstrates good understanding and can follow-up on a as needed basis.     Waynetta Sandy MD [facility]

## 2017-06-21 DIAGNOSIS — E1129 Type 2 diabetes mellitus with other diabetic kidney complication: Secondary | ICD-10-CM | POA: Diagnosis not present

## 2017-06-21 DIAGNOSIS — N186 End stage renal disease: Secondary | ICD-10-CM | POA: Diagnosis not present

## 2017-06-21 DIAGNOSIS — N2581 Secondary hyperparathyroidism of renal origin: Secondary | ICD-10-CM | POA: Diagnosis not present

## 2017-06-21 DIAGNOSIS — Z23 Encounter for immunization: Secondary | ICD-10-CM | POA: Diagnosis not present

## 2017-06-23 DIAGNOSIS — E113512 Type 2 diabetes mellitus with proliferative diabetic retinopathy with macular edema, left eye: Secondary | ICD-10-CM | POA: Diagnosis not present

## 2017-06-24 DIAGNOSIS — N2581 Secondary hyperparathyroidism of renal origin: Secondary | ICD-10-CM | POA: Diagnosis not present

## 2017-06-24 DIAGNOSIS — Z23 Encounter for immunization: Secondary | ICD-10-CM | POA: Diagnosis not present

## 2017-06-24 DIAGNOSIS — N186 End stage renal disease: Secondary | ICD-10-CM | POA: Diagnosis not present

## 2017-06-24 DIAGNOSIS — E1129 Type 2 diabetes mellitus with other diabetic kidney complication: Secondary | ICD-10-CM | POA: Diagnosis not present

## 2017-06-26 DIAGNOSIS — N2581 Secondary hyperparathyroidism of renal origin: Secondary | ICD-10-CM | POA: Diagnosis not present

## 2017-06-26 DIAGNOSIS — E1129 Type 2 diabetes mellitus with other diabetic kidney complication: Secondary | ICD-10-CM | POA: Diagnosis not present

## 2017-06-26 DIAGNOSIS — Z23 Encounter for immunization: Secondary | ICD-10-CM | POA: Diagnosis not present

## 2017-06-26 DIAGNOSIS — N186 End stage renal disease: Secondary | ICD-10-CM | POA: Diagnosis not present

## 2017-06-28 DIAGNOSIS — E1129 Type 2 diabetes mellitus with other diabetic kidney complication: Secondary | ICD-10-CM | POA: Diagnosis not present

## 2017-06-28 DIAGNOSIS — N186 End stage renal disease: Secondary | ICD-10-CM | POA: Diagnosis not present

## 2017-06-28 DIAGNOSIS — Z23 Encounter for immunization: Secondary | ICD-10-CM | POA: Diagnosis not present

## 2017-06-28 DIAGNOSIS — N2581 Secondary hyperparathyroidism of renal origin: Secondary | ICD-10-CM | POA: Diagnosis not present

## 2017-06-30 ENCOUNTER — Ambulatory Visit: Payer: Medicare Other

## 2017-06-30 ENCOUNTER — Ambulatory Visit: Payer: Medicare Other | Attending: Family Medicine

## 2017-06-30 MED FILL — CARVEDILOL 6.25 MG TABLET: 6.25 | 15 days supply | Qty: 60 | Fill #1

## 2017-07-01 DIAGNOSIS — N2581 Secondary hyperparathyroidism of renal origin: Secondary | ICD-10-CM | POA: Diagnosis not present

## 2017-07-01 DIAGNOSIS — Z23 Encounter for immunization: Secondary | ICD-10-CM | POA: Diagnosis not present

## 2017-07-01 DIAGNOSIS — E1129 Type 2 diabetes mellitus with other diabetic kidney complication: Secondary | ICD-10-CM | POA: Diagnosis not present

## 2017-07-01 DIAGNOSIS — N186 End stage renal disease: Secondary | ICD-10-CM | POA: Diagnosis not present

## 2017-07-03 DIAGNOSIS — E1129 Type 2 diabetes mellitus with other diabetic kidney complication: Secondary | ICD-10-CM | POA: Diagnosis not present

## 2017-07-03 DIAGNOSIS — Z23 Encounter for immunization: Secondary | ICD-10-CM | POA: Diagnosis not present

## 2017-07-03 DIAGNOSIS — N186 End stage renal disease: Secondary | ICD-10-CM | POA: Diagnosis not present

## 2017-07-03 DIAGNOSIS — N2581 Secondary hyperparathyroidism of renal origin: Secondary | ICD-10-CM | POA: Diagnosis not present

## 2017-07-05 DIAGNOSIS — Z23 Encounter for immunization: Secondary | ICD-10-CM | POA: Diagnosis not present

## 2017-07-05 DIAGNOSIS — E1129 Type 2 diabetes mellitus with other diabetic kidney complication: Secondary | ICD-10-CM | POA: Diagnosis not present

## 2017-07-05 DIAGNOSIS — N186 End stage renal disease: Secondary | ICD-10-CM | POA: Diagnosis not present

## 2017-07-05 DIAGNOSIS — N2581 Secondary hyperparathyroidism of renal origin: Secondary | ICD-10-CM | POA: Diagnosis not present

## 2017-07-08 ENCOUNTER — Encounter: Payer: Self-pay | Admitting: *Deleted

## 2017-07-08 ENCOUNTER — Other Ambulatory Visit: Payer: Self-pay | Admitting: *Deleted

## 2017-07-08 DIAGNOSIS — Z23 Encounter for immunization: Secondary | ICD-10-CM | POA: Diagnosis not present

## 2017-07-08 DIAGNOSIS — N2581 Secondary hyperparathyroidism of renal origin: Secondary | ICD-10-CM | POA: Diagnosis not present

## 2017-07-08 DIAGNOSIS — N186 End stage renal disease: Secondary | ICD-10-CM | POA: Diagnosis not present

## 2017-07-08 DIAGNOSIS — E1129 Type 2 diabetes mellitus with other diabetic kidney complication: Secondary | ICD-10-CM | POA: Diagnosis not present

## 2017-07-08 NOTE — Progress Notes (Signed)
Spoke with Raquel Sarna at [facility] [place] of instructions faxed to her to review and give to patient.

## 2017-07-08 NOTE — Progress Notes (Signed)
Referral from [facility] for [place] due to "difficult cannulation and pulling clots". Per Raquel Sarna.

## 2017-07-09 ENCOUNTER — Telehealth: Payer: Self-pay | Admitting: *Deleted

## 2017-07-09 ENCOUNTER — Other Ambulatory Visit: Payer: Self-pay | Admitting: *Deleted

## 2017-07-09 NOTE — Telephone Encounter (Signed)
Confirmed with Christina at [place] they received faxed instruction for patient. They are to review with patient.

## 2017-07-10 DIAGNOSIS — N2581 Secondary hyperparathyroidism of renal origin: Secondary | ICD-10-CM | POA: Diagnosis not present

## 2017-07-10 DIAGNOSIS — Z23 Encounter for immunization: Secondary | ICD-10-CM | POA: Diagnosis not present

## 2017-07-10 DIAGNOSIS — N186 End stage renal disease: Secondary | ICD-10-CM | POA: Diagnosis not present

## 2017-07-10 DIAGNOSIS — E1129 Type 2 diabetes mellitus with other diabetic kidney complication: Secondary | ICD-10-CM | POA: Diagnosis not present

## 2017-07-12 DIAGNOSIS — N2581 Secondary hyperparathyroidism of renal origin: Secondary | ICD-10-CM | POA: Diagnosis not present

## 2017-07-12 DIAGNOSIS — Z23 Encounter for immunization: Secondary | ICD-10-CM | POA: Diagnosis not present

## 2017-07-12 DIAGNOSIS — N186 End stage renal disease: Secondary | ICD-10-CM | POA: Diagnosis not present

## 2017-07-12 DIAGNOSIS — E1129 Type 2 diabetes mellitus with other diabetic kidney complication: Secondary | ICD-10-CM | POA: Diagnosis not present

## 2017-07-14 ENCOUNTER — Ambulatory Visit (HOSPITAL_COMMUNITY)
Admission: RE | Admit: 2017-07-14 | Discharge: 2017-07-14 | Disposition: A | Discharge status: Home / Self Care | Payer: Medicare Other | Source: Ambulatory Visit | Attending: Vascular Surgery | Admitting: Vascular Surgery

## 2017-07-14 ENCOUNTER — Ambulatory Visit (HOSPITAL_COMMUNITY)
Admission: RE | Disposition: A | Discharge status: Home / Self Care | Payer: Self-pay | Source: Ambulatory Visit | Attending: Vascular Surgery

## 2017-07-14 ENCOUNTER — Telehealth: Payer: Self-pay | Admitting: *Deleted

## 2017-07-14 ENCOUNTER — Other Ambulatory Visit: Payer: Self-pay | Admitting: *Deleted

## 2017-07-14 ENCOUNTER — Telehealth: Payer: Self-pay | Admitting: Family Medicine

## 2017-07-14 ENCOUNTER — Encounter: Payer: Self-pay | Admitting: *Deleted

## 2017-07-14 DIAGNOSIS — N186 End stage renal disease: Secondary | ICD-10-CM | POA: Insufficient documentation

## 2017-07-14 DIAGNOSIS — Z8673 Personal history of transient ischemic attack (TIA), and cerebral infarction without residual deficits: Secondary | ICD-10-CM | POA: Insufficient documentation

## 2017-07-14 DIAGNOSIS — E1122 Type 2 diabetes mellitus with diabetic chronic kidney disease: Secondary | ICD-10-CM | POA: Diagnosis not present

## 2017-07-14 DIAGNOSIS — Y832 Surgical operation with anastomosis, bypass or graft as the cause of abnormal reaction of the patient, or of later complication, without mention of misadventure at the time of the procedure: Secondary | ICD-10-CM | POA: Diagnosis not present

## 2017-07-14 DIAGNOSIS — T82898A Other specified complication of vascular prosthetic devices, implants and grafts, initial encounter: Secondary | ICD-10-CM | POA: Diagnosis not present

## 2017-07-14 DIAGNOSIS — H409 Unspecified glaucoma: Secondary | ICD-10-CM | POA: Insufficient documentation

## 2017-07-14 DIAGNOSIS — Z87891 Personal history of nicotine dependence: Secondary | ICD-10-CM | POA: Diagnosis not present

## 2017-07-14 DIAGNOSIS — T82858A Stenosis of vascular prosthetic devices, implants and grafts, initial encounter: Secondary | ICD-10-CM | POA: Insufficient documentation

## 2017-07-14 DIAGNOSIS — I120 Hypertensive chronic kidney disease with stage 5 chronic kidney disease or end stage renal disease: Secondary | ICD-10-CM | POA: Diagnosis not present

## 2017-07-14 DIAGNOSIS — Z7982 Long term (current) use of aspirin: Secondary | ICD-10-CM | POA: Diagnosis not present

## 2017-07-14 DIAGNOSIS — Z992 Dependence on renal dialysis: Secondary | ICD-10-CM | POA: Diagnosis not present

## 2017-07-14 DIAGNOSIS — I2510 Atherosclerotic heart disease of native coronary artery without angina pectoris: Secondary | ICD-10-CM | POA: Diagnosis not present

## 2017-07-14 DIAGNOSIS — Z794 Long term (current) use of insulin: Secondary | ICD-10-CM | POA: Insufficient documentation

## 2017-07-14 HISTORY — PX: A/V FISTULAGRAM: CATH118298

## 2017-07-14 LAB — POCT I-STAT, CHEM 8
BUN: 62 mg/dL — ABNORMAL HIGH (ref 6–20)
CREATININE: 6.1 mg/dL — AB (ref 0.61–1.24)
Calcium, Ion: 1.13 mmol/L — ABNORMAL LOW (ref 1.15–1.40)
Chloride: 99 mmol/L — ABNORMAL LOW (ref 101–111)
Glucose, Bld: 109 mg/dL — ABNORMAL HIGH (ref 65–99)
HEMATOCRIT: 35 % — AB (ref 39.0–52.0)
HEMOGLOBIN: 11.9 g/dL — AB (ref 13.0–17.0)
Potassium: 4.3 mmol/L (ref 3.5–5.1)
Sodium: 136 mmol/L (ref 135–145)
TCO2: 29 mmol/L (ref 22–32)

## 2017-07-14 SURGERY — A/V FISTULAGRAM
Anesthesia: LOCAL | Laterality: Left

## 2017-07-14 MED ORDER — LIDOCAINE HCL (PF) 1 % IJ SOLN
INTRAMUSCULAR | Status: DC | PRN
  Administered 2017-07-14: 2 mL

## 2017-07-14 MED ORDER — SODIUM CHLORIDE 0.9% FLUSH: 3.0000 mL | INTRAVENOUS | Status: DC | PRN

## 2017-07-14 MED ORDER — HEPARIN (PORCINE) IN NACL 2-0.9 UNIT/ML-% IJ SOLN
INTRAMUSCULAR | Status: AC | PRN
  Administered 2017-07-14: 500 mL

## 2017-07-14 MED ORDER — LIDOCAINE HCL 1 % IJ SOLN
INTRAMUSCULAR | Status: AC
  Filled 2017-07-14: qty 20

## 2017-07-14 MED ORDER — HEPARIN (PORCINE) IN NACL 2-0.9 UNIT/ML-% IJ SOLN
INTRAMUSCULAR | Status: AC
  Filled 2017-07-14: qty 1000

## 2017-07-14 MED FILL — AMLODIPINE BESYLATE 2.5 MG: 2.5 | 30 days supply | Qty: 30 | Fill #1

## 2017-07-14 MED FILL — GABAPENTIN 300 MG CAPSULE: 300 | 30 days supply | Qty: 30 | Fill #0

## 2017-07-14 SURGICAL SUPPLY — 11 items
BAG SNAP BAND KOVER 36X36 (MISCELLANEOUS) ×2 IMPLANT
COVER DOME SNAP 22 D (MISCELLANEOUS) ×2 IMPLANT
COVER PRB 48X5XTLSCP FOLD TPE (BAG) ×1 IMPLANT
COVER PROBE 5X48 (BAG) ×1
KIT MICROPUNCTURE NIT STIFF (SHEATH) ×2 IMPLANT
PROTECTION STATION PRESSURIZED (MISCELLANEOUS) ×2
STATION PROTECTION PRESSURIZED (MISCELLANEOUS) ×1 IMPLANT
STOPCOCK MORSE 400PSI 3WAY (MISCELLANEOUS) ×2 IMPLANT
TRAY PV CATH (CUSTOM PROCEDURE TRAY) ×2 IMPLANT
TUBING CIL FLEX 10 FLL-RA (TUBING) ×2 IMPLANT
WIRE TORQFLEX AUST .018X40CM (WIRE) ×2 IMPLANT

## 2017-07-14 NOTE — H&P (Signed)
HP     MRN #:  [id number]  History of Present Illness: This is a 57 y.o. male with history of esrd on hd via catheter. He has a left upper arm avf going back to 2017 that has failed to mature despite strong thrill. Recent flow greater than 1L per minute. Not on blood thinners.   Past Medical History:  Diagnosis Date  . CAD (coronary artery disease)    a. underwent cath in 02/2016 after an abnormal nuc which showed 2V CAD in RCA and OM3 with no good targets for PCI. Medical managment recommended.   . Chronic kidney disease   . Diabetes mellitus without complication ([place])    type II - followed by Dr Chalmers Cater   . Difficult intubation    ER intubation 2016  . ESRD (end stage renal disease) ([place])    Tues, Th, Sat [place]  . Glaucoma   . Hemodialysis patient [facility])    has been on dialysis since approx 10/2016   . Hypertension    followed by [facility]  . Stroke [facility])    patient denies on 02/19/2017     Past Surgical History:  Procedure Laterality Date  . A/V FISTULAGRAM Left 11/27/2016   Procedure: A/V Fistulagram;  Surgeon: Waynetta Sandy, MD;  Location: [place] CV [facility];  Service: Cardiovascular;  Laterality: Left;  . A/V FISTULAGRAM Left 05/07/2017   Procedure: A/V FISTULAGRAM;  Surgeon: Waynetta Sandy, MD;  Location: [place] [facility];  Service: Cardiovascular;  Laterality: Left;  . AV FISTULA PLACEMENT Left 03/12/2016   Procedure: Left Arm ARTERIOVENOUS (AV) FISTULA CREATION;  Surgeon: Waynetta Sandy, MD;  Location: [place];  Service: Vascular;  Laterality: Left;  . BASCILIC VEIN TRANSPOSITION Left 05/14/2016   Procedure: SECOND STAGE LEFT BASILIC VEIN TRANSPOSITION;  Surgeon: Waynetta Sandy, MD;  Location: [place];  Service: Vascular;  Laterality: Left;  . BRONCHOSCOPY  02/14/2015   for pulm hemorrhage  . CARDIAC CATHETERIZATION N/A 03/04/2016   Procedure: Left Heart Cath and Coronary Angiography;  Surgeon: Leonie Man, MD;  Location: [place] [facility];  Service: Cardiovascular;  Laterality: N/A;  . COLONOSCOPY WITH PROPOFOL N/A 04/07/2017   Procedure: COLONOSCOPY WITH PROPOFOL;  Surgeon: Ronnette Juniper, MD;  Location: [place];  Service: Gastroenterology;  Laterality: N/A;  . EYE SURGERY Left   . INSERTION OF DIALYSIS CATHETER Right 11/08/2016   Procedure: INSERTION OF DIALYSIS CATHETER;  Surgeon: Rosetta Posner, MD;  Location: [place];  Service: Vascular;  Laterality: Right;  . IR PARACENTESIS  11/13/2016  . PERIPHERAL VASCULAR BALLOON ANGIOPLASTY Left 11/27/2016   Procedure: Peripheral Vascular Balloon Angioplasty;  Surgeon: Waynetta Sandy, MD;  Location: [place] CV LAB;  Service: Cardiovascular;  Laterality: Left;  ARM FISTULA  . PERIPHERAL VASCULAR BALLOON ANGIOPLASTY Left 05/07/2017   Procedure: PERIPHERAL VASCULAR BALLOON ANGIOPLASTY;  Surgeon: Waynetta Sandy, MD;  Location: [place] [facility];  Service: Cardiovascular;  Laterality: Left;    No Known Allergies  Prior to Admission medications   Medication Sig Start Date End Date Taking? Authorizing Provider  amLODipine (NORVASC) 2.5 MG tablet Take 1 tablet (2.5 mg total) by mouth daily. 06/16/17  Yes Charlott Rakes, MD  aspirin 81 MG chewable tablet Chew 1 tablet (81 mg total) by mouth daily. Patient taking differently: Chew 81 mg by mouth every evening.  09/14/15  Yes Angiulli, Lavon Paganini, PA-C  atorvastatin (LIPITOR) 40 MG tablet Take 1 tablet (40 mg total) by  mouth daily at 6 PM. 06/16/17  Yes Newlin, Enobong, MD  calcitRIOL (ROCALTROL) 0.25 MCG capsule Take 0.25 mcg by mouth daily. 11/04/16  Yes [provider]  carvedilol (COREG) 6.25 MG tablet Take 2 tablets (12.5 mg total) by mouth 2 (two) times daily with a meal. 06/16/17  Yes Newlin, Enobong, MD  furosemide (LASIX) 80 MG tablet Take 1 tablet (80 mg total) by mouth 2 (two) times daily. 06/16/17  Yes Charlott Rakes, MD  gabapentin (NEURONTIN) 300 MG capsule Take  1 capsule (300 mg total) by mouth daily. 06/16/17  Yes Newlin, Charlane Ferretti, MD  insulin aspart (NOVOLOG) 100 UNIT/ML injection 0-12 units 3 times daily before meals as well as per sliding scale Patient taking differently: Inject 0-12 Units into the skin 3 (three) times daily with meals. Per sliding scale 03/14/17  Yes Newlin, Enobong, MD  insulin glargine (LANTUS) 100 UNIT/ML injection Inject subcutaneously 10 units of Lantus in the morning and 15 units of Lantus in the evening Patient taking differently: Inject 15 Units into the skin at bedtime.  06/16/17  Yes Newlin, Enobong, MD  RENAGEL 800 MG tablet Take 800-1,600 mg by mouth See admin instructions. Take 1600 mg by mouth 3 times daily with meals and take 800 mg by mouth twice daily with snacks 05/23/17  Yes [provider]  ACCU-CHEK SOFTCLIX LANCETS lancets Use as instructed 3 times daily before meals and at bedtime. E11.9 Patient not taking: Reported on 07/09/2017 06/16/17   Charlott Rakes, MD  Blood Glucose Monitoring Suppl (ACCU-CHEK AVIVA) device Use as instructed 3 times daily before meals and at bedtime. E11.9 Patient not taking: Reported on 07/09/2017 05/07/17   Charlott Rakes, MD  Blood Glucose Monitoring Suppl (TRUE METRIX METER) DEVI 1 each by Does not apply route 3 (three) times daily. Patient not taking: Reported on 07/09/2017 05/05/17   Charlott Rakes, MD  Darbepoetin Alfa (ARANESP) 150 MCG/0.3ML SOSY injection Inject 0.3 mLs (150 mcg total) into the vein every Saturday with hemodialysis. 11/16/16   Jani Gravel, MD  glucose blood (ACCU-CHEK AVIVA PLUS) test strip Use 3 times daily before meals and at bedtime. E11.9 Patient not taking: Reported on 07/09/2017 06/16/17   Charlott Rakes, MD  hydrocortisone (ANUSOL-HC) 2.5 % rectal cream Place rectally 2 (two) times daily. Patient not taking: Reported on 07/09/2017 11/16/16   Jani Gravel, MD  Lancet Devices [facility]) lancets Use as instructed 3 times daily before meals and at  bedtime. E11.9 Patient not taking: Reported on 07/09/2017 05/07/17   Charlott Rakes, MD  Nutritional Supplements (FEEDING SUPPLEMENT, NEPRO CARB STEADY,) LIQD Take 237 mLs by mouth 2 (two) times daily between meals. Patient not taking: Reported on 07/09/2017 11/16/16   Jani Gravel, MD  ondansetron (ZOFRAN) 4 MG tablet Take 1 tablet (4 mg total) by mouth every 8 (eight) hours as needed for nausea or vomiting. Patient not taking: Reported on 07/09/2017 03/14/17   Charlott Rakes, MD  polyethylene glycol (MIRALAX / GLYCOLAX) packet Take 17 g by mouth daily. Patient not taking: Reported on 07/09/2017 11/16/16   Jani Gravel, MD    Social History   Socioeconomic History  . Marital status: Married    Spouse name: Not on file  . Number of children: Not on file  . Years of education: Not on file  . Highest education level: Not on file  Social Needs  . Financial resource strain: Not on file  . Food insecurity - worry: Not on file  . Food insecurity - inability: Not on  file  . Transportation needs - medical: Not on file  . Transportation needs - non-medical: Not on file  Occupational History  . Not on file  Tobacco Use  . Smoking status: Former Smoker    Packs/day: 0.00    Years: 4.00    Pack years: 0.00    Last attempt to quit: 02/23/1983    Years since quitting: 34.4  . Smokeless tobacco: Never Used  Substance and Sexual Activity  . Alcohol use: No  . Drug use: No  . Sexual activity: Not on file  Other Topics Concern  . Not on file  Social History Narrative  . Not on file     Family History  Problem Relation Age of Onset  . Diabetes Mother     ROS: no complaints today   Physical Examination  Vitals:   07/14/17 0544 07/14/17 0600  BP: (!) 171/65   Pulse: 94   Resp:  18  Temp: 97.8 F (36.6 C)   SpO2: 100%    Body mass index is 21.22 kg/m.  General:  WDWN in NAD HENT: WNL, normocephalic Pulmonary: normal non-labored breathing, without Rales, rhonchi,   wheezing Cardiac: palpable left radial pulse Abdomen: soft, NT/ND, no masses Extremities: strong thrill in left upper arm Neurologic: A&O X 3; Appropriate Affect ; SENSATION: normal; MOTOR FUNCTION:  moving all extremities equally. Speech is fluent/normal  CBC    Component Value Date/Time   WBC 7.1 02/08/2017 0756   RBC 3.45 (L) 02/08/2017 0756   HGB 11.9 (L) 07/14/2017 0624   HCT 35.0 (L) 07/14/2017 0624   PLT 137 (L) 02/08/2017 0756   MCV 89.9 02/08/2017 0756   MCV 85.0 02/27/2015 1458   MCH 31.0 02/08/2017 0756   MCHC 34.5 02/08/2017 0756   RDW 13.8 02/08/2017 0756   LYMPHSABS 1.9 02/07/2017 0501   MONOABS 0.6 02/07/2017 0501   EOSABS 0.4 02/07/2017 0501   BASOSABS 0.0 02/07/2017 0501    BMET    Component Value Date/Time   NA 136 07/14/2017 0624   NA 132 (L) 09/02/2016 1158   K 4.3 07/14/2017 0624   CL 99 (L) 07/14/2017 0624   CO2 27 02/08/2017 0756   GLUCOSE 109 (H) 07/14/2017 0624   BUN 62 (H) 07/14/2017 0624   BUN 111 (HH) 09/02/2016 1158   CREATININE 6.10 (H) 07/14/2017 0624   CREATININE 4.38 (H) 03/01/2016 0856   CALCIUM 8.5 (L) 02/08/2017 0756   GFRNONAA 14 (L) 02/08/2017 0756   GFRNONAA 16 (L) 09/21/2015 1129   GFRAA 16 (L) 02/08/2017 0756   GFRAA 19 (L) 09/21/2015 1129    COAGS: Lab Results  Component Value Date   INR 1.09 02/07/2017   INR 1.0 03/01/2016   INR 1.29 09/03/2015     Non-Invasive Vascular Imaging:      ASSESSMENT/PLAN: This is a 57 y.o. male with esrd and left arm avf that has failed to mature. Plan for fistulogram. Will likely need conversion to graft if no significant intervention can be undertaken.  Jaiyla Granados C. Donzetta Matters, MD [facility] [place] Office: [phone] Pager: [phone]

## 2017-07-14 NOTE — Telephone Encounter (Signed)
This is listed as a historical med. Will need PCP approval.

## 2017-07-14 NOTE — Progress Notes (Signed)
Letter of instructions faxed to Dialysis center for staff to review with patient for procedure on 07/18/17.

## 2017-07-14 NOTE — Telephone Encounter (Signed)
Pt came to the office to request a refill for  calcitRIOL (ROCALTROL) 0.25 MCG capsule Please sent it to [facility] on [address], please follow up

## 2017-07-14 NOTE — Discharge Instructions (Signed)
**Note [facility]-identified via Obfuscation** Fistulogram, Care After °Refer to this sheet in the next few weeks. These instructions provide you with information on caring for yourself after your procedure. Your health care provider may also give you more specific instructions. Your treatment has been planned according to current medical practices, but problems sometimes occur. Call your health care provider if you have any problems or questions after your procedure. °What can I expect after the procedure? °After your procedure, it is typical to have the following: °· A small amount of discomfort in the area where the catheters were placed. °· A small amount of bruising around the fistula. °· Sleepiness and fatigue. ° °Follow these instructions at home: °· Rest at home for the day following your procedure. °· Do not drive or operate heavy machinery while taking pain medicine. °· Take medicines only as directed by your health care provider. °· Do not take baths, swim, or use a hot tub until your health care provider approves. You may shower 24 hours after the procedure or as directed by your health care provider. °· There are many different ways to close and cover an incision, including stitches, skin glue, and adhesive strips. Follow your health care provider's instructions on: °? Incision care. °? Bandage (dressing) changes and removal. °? Incision closure removal. °· Monitor your dialysis fistula carefully. °Contact a health care provider if: °· You have drainage, redness, swelling, or pain at your catheter site. °· You have a fever. °· You have chills. °Get help right away if: °· You feel weak. °· You have trouble balancing. °· You have trouble moving your arms or legs. °· You have problems with your speech or vision. °· You can no longer feel a vibration or buzz when you put your fingers over your dialysis fistula. °· The limb that was used for the procedure: °? Swells. °? Is painful. °? Is cold. °? Is discolored, such as blue or pale white. °This  information is not intended to replace advice given to you by your health care provider. Make sure you discuss any questions you have with your health care provider. °Document Released: 09/20/2013 Document Revised: 10/12/2015 Document Reviewed: 06/25/2013 °Elsevier Interactive Patient Education © 2018 Elsevier Inc. ° °

## 2017-07-14 NOTE — Op Note (Signed)
    Patient name: ELIGIO ANGERT MRN: [id number] DOB: 1961/01/14 Sex: male  07/14/2017 Pre-operative Diagnosis: esrd Post-operative diagnosis:  Same Surgeon:  Erlene Quan C. Donzetta Matters, MD Procedure Performed: 1.  US guided cannulation of left arm avf 2.  Left arm shuntogram  Indications: 57 year old male with history of end-stage renal disease on dialysis via catheter.  He has a left arm second stage basilic vein transposed fistula that has yet to work.  He is indicated for repeat fistulogram possible intervention.  Findings: The fistula is sclerotic throughout its course appears to not be viable.  He will need conversion to a left arm AV graft and consideration of ligation of the fistula.   Procedure:  The patient was identified in the holding area and taken to room 8.  The patient was then placed supine on the table and prepped and draped in the usual sterile fashion.  A time out was called.  Ultrasound was used to evaluate the left arm AV fistula.  Ultrasound was used to cannulate this fistula with micropuncture needle and wire followed by sheath.  Left upper extremity fistulogram was performed including the retrograde images.  Fistula unfortunately is sclerotic throughout its course and will not be viable for dialysis access.  He will be considered for conversion to a left arm AV graft.  Contrast: 20cc   Deborrah Mabin C. Donzetta Matters, MD [facility] [place] Office: [phone] Pager: [phone]

## 2017-07-14 NOTE — H&P (View-Only) (Signed)
HP     MRN #:  [id number]  History of Present Illness: This is a 57 y.o. male with history of esrd on hd via catheter. He has a left upper arm avf going back to 2017 that has failed to mature despite strong thrill. Recent flow greater than 1L per minute. Not on blood thinners.   Past Medical History:  Diagnosis Date  . CAD (coronary artery disease)    a. underwent cath in 02/2016 after an abnormal nuc which showed 2V CAD in RCA and OM3 with no good targets for PCI. Medical managment recommended.   . Chronic kidney disease   . Diabetes mellitus without complication ([place])    type II - followed by Dr Chalmers Cater   . Difficult intubation    ER intubation 2016  . ESRD (end stage renal disease) ([place])    Tues, Th, Sat [place]  . Glaucoma   . Hemodialysis patient [facility])    has been on dialysis since approx 10/2016   . Hypertension    followed by [facility]  . Stroke [facility])    patient denies on 02/19/2017     Past Surgical History:  Procedure Laterality Date  . A/V FISTULAGRAM Left 11/27/2016   Procedure: A/V Fistulagram;  Surgeon: Waynetta Sandy, MD;  Location: [place] [facility];  Service: Cardiovascular;  Laterality: Left;  . A/V FISTULAGRAM Left 05/07/2017   Procedure: A/V FISTULAGRAM;  Surgeon: Waynetta Sandy, MD;  Location: [place] [facility];  Service: Cardiovascular;  Laterality: Left;  . AV FISTULA PLACEMENT Left 03/12/2016   Procedure: Left Arm ARTERIOVENOUS (AV) FISTULA CREATION;  Surgeon: Waynetta Sandy, MD;  Location: [place];  Service: Vascular;  Laterality: Left;  . BASCILIC VEIN TRANSPOSITION Left 05/14/2016   Procedure: SECOND STAGE LEFT BASILIC VEIN TRANSPOSITION;  Surgeon: Waynetta Sandy, MD;  Location: [place];  Service: Vascular;  Laterality: Left;  . BRONCHOSCOPY  02/14/2015   for pulm hemorrhage  . CARDIAC CATHETERIZATION N/A 03/04/2016   Procedure: Left Heart Cath and Coronary Angiography;  Surgeon: Leonie Man, MD;  Location: [place] CV LAB;  Service: Cardiovascular;  Laterality: N/A;  . COLONOSCOPY WITH PROPOFOL N/A 04/07/2017   Procedure: COLONOSCOPY WITH PROPOFOL;  Surgeon: Ronnette Juniper, MD;  Location: [place];  Service: Gastroenterology;  Laterality: N/A;  . EYE SURGERY Left   . INSERTION OF DIALYSIS CATHETER Right 11/08/2016   Procedure: INSERTION OF DIALYSIS CATHETER;  Surgeon: Rosetta Posner, MD;  Location: [place];  Service: Vascular;  Laterality: Right;  . IR PARACENTESIS  11/13/2016  . PERIPHERAL VASCULAR BALLOON ANGIOPLASTY Left 11/27/2016   Procedure: Peripheral Vascular Balloon Angioplasty;  Surgeon: Waynetta Sandy, MD;  Location: [place] CV LAB;  Service: Cardiovascular;  Laterality: Left;  ARM FISTULA  . PERIPHERAL VASCULAR BALLOON ANGIOPLASTY Left 05/07/2017   Procedure: PERIPHERAL VASCULAR BALLOON ANGIOPLASTY;  Surgeon: Waynetta Sandy, MD;  Location: [place] CV LAB;  Service: Cardiovascular;  Laterality: Left;    No Known Allergies  Prior to Admission medications   Medication Sig Start Date End Date Taking? Authorizing Provider  amLODipine (NORVASC) 2.5 MG tablet Take 1 tablet (2.5 mg total) by mouth daily. 06/16/17  Yes Charlott Rakes, MD  aspirin 81 MG chewable tablet Chew 1 tablet (81 mg total) by mouth daily. Patient taking differently: Chew 81 mg by mouth every evening.  09/14/15  Yes Angiulli, Lavon Paganini, PA-C  atorvastatin (LIPITOR) 40 MG tablet Take 1 tablet (40 mg total) by  mouth daily at 6 PM. 06/16/17  Yes Newlin, Enobong, MD  calcitRIOL (ROCALTROL) 0.25 MCG capsule Take 0.25 mcg by mouth daily. 11/04/16  Yes [provider]  carvedilol (COREG) 6.25 MG tablet Take 2 tablets (12.5 mg total) by mouth 2 (two) times daily with a meal. 06/16/17  Yes Newlin, Enobong, MD  furosemide (LASIX) 80 MG tablet Take 1 tablet (80 mg total) by mouth 2 (two) times daily. 06/16/17  Yes Charlott Rakes, MD  gabapentin (NEURONTIN) 300 MG capsule Take  1 capsule (300 mg total) by mouth daily. 06/16/17  Yes Newlin, Charlane Ferretti, MD  insulin aspart (NOVOLOG) 100 UNIT/ML injection 0-12 units 3 times daily before meals as well as per sliding scale Patient taking differently: Inject 0-12 Units into the skin 3 (three) times daily with meals. Per sliding scale 03/14/17  Yes Newlin, Enobong, MD  insulin glargine (LANTUS) 100 UNIT/ML injection Inject subcutaneously 10 units of Lantus in the morning and 15 units of Lantus in the evening Patient taking differently: Inject 15 Units into the skin at bedtime.  06/16/17  Yes Newlin, Enobong, MD  RENAGEL 800 MG tablet Take 800-1,600 mg by mouth See admin instructions. Take 1600 mg by mouth 3 times daily with meals and take 800 mg by mouth twice daily with snacks 05/23/17  Yes [provider]  ACCU-CHEK SOFTCLIX LANCETS lancets Use as instructed 3 times daily before meals and at bedtime. E11.9 Patient not taking: Reported on 07/09/2017 06/16/17   Charlott Rakes, MD  Blood Glucose Monitoring Suppl (ACCU-CHEK AVIVA) device Use as instructed 3 times daily before meals and at bedtime. E11.9 Patient not taking: Reported on 07/09/2017 05/07/17   Charlott Rakes, MD  Blood Glucose Monitoring Suppl (TRUE METRIX METER) DEVI 1 each by Does not apply route 3 (three) times daily. Patient not taking: Reported on 07/09/2017 05/05/17   Charlott Rakes, MD  Darbepoetin Alfa (ARANESP) 150 MCG/0.3ML SOSY injection Inject 0.3 mLs (150 mcg total) into the vein every Saturday with hemodialysis. 11/16/16   Jani Gravel, MD  glucose blood (ACCU-CHEK AVIVA PLUS) test strip Use 3 times daily before meals and at bedtime. E11.9 Patient not taking: Reported on 07/09/2017 06/16/17   Charlott Rakes, MD  hydrocortisone (ANUSOL-HC) 2.5 % rectal cream Place rectally 2 (two) times daily. Patient not taking: Reported on 07/09/2017 11/16/16   Jani Gravel, MD  Lancet Devices [facility]) lancets Use as instructed 3 times daily before meals and at  bedtime. E11.9 Patient not taking: Reported on 07/09/2017 05/07/17   Charlott Rakes, MD  Nutritional Supplements (FEEDING SUPPLEMENT, NEPRO CARB STEADY,) LIQD Take 237 mLs by mouth 2 (two) times daily between meals. Patient not taking: Reported on 07/09/2017 11/16/16   Jani Gravel, MD  ondansetron (ZOFRAN) 4 MG tablet Take 1 tablet (4 mg total) by mouth every 8 (eight) hours as needed for nausea or vomiting. Patient not taking: Reported on 07/09/2017 03/14/17   Charlott Rakes, MD  polyethylene glycol (MIRALAX / GLYCOLAX) packet Take 17 g by mouth daily. Patient not taking: Reported on 07/09/2017 11/16/16   Jani Gravel, MD    Social History   Socioeconomic History  . Marital status: Married    Spouse name: Not on file  . Number of children: Not on file  . Years of education: Not on file  . Highest education level: Not on file  Social Needs  . Financial resource strain: Not on file  . Food insecurity - worry: Not on file  . Food insecurity - inability: Not on  file  . Transportation needs - medical: Not on file  . Transportation needs - non-medical: Not on file  Occupational History  . Not on file  Tobacco Use  . Smoking status: Former Smoker    Packs/day: 0.00    Years: 4.00    Pack years: 0.00    Last attempt to quit: 02/23/1983    Years since quitting: 34.4  . Smokeless tobacco: Never Used  Substance and Sexual Activity  . Alcohol use: No  . Drug use: No  . Sexual activity: Not on file  Other Topics Concern  . Not on file  Social History Narrative  . Not on file     Family History  Problem Relation Age of Onset  . Diabetes Mother     ROS: no complaints today   Physical Examination  Vitals:   07/14/17 0544 07/14/17 0600  BP: (!) 171/65   Pulse: 94   Resp:  18  Temp: 97.8 F (36.6 C)   SpO2: 100%    Body mass index is 21.22 kg/m.  General:  WDWN in NAD HENT: WNL, normocephalic Pulmonary: normal non-labored breathing, without Rales, rhonchi,   wheezing Cardiac: palpable left radial pulse Abdomen: soft, NT/ND, no masses Extremities: strong thrill in left upper arm Neurologic: A&O X 3; Appropriate Affect ; SENSATION: normal; MOTOR FUNCTION:  moving all extremities equally. Speech is fluent/normal  CBC    Component Value Date/Time   WBC 7.1 02/08/2017 0756   RBC 3.45 (L) 02/08/2017 0756   HGB 11.9 (L) 07/14/2017 0624   HCT 35.0 (L) 07/14/2017 0624   PLT 137 (L) 02/08/2017 0756   MCV 89.9 02/08/2017 0756   MCV 85.0 02/27/2015 1458   MCH 31.0 02/08/2017 0756   MCHC 34.5 02/08/2017 0756   RDW 13.8 02/08/2017 0756   LYMPHSABS 1.9 02/07/2017 0501   MONOABS 0.6 02/07/2017 0501   EOSABS 0.4 02/07/2017 0501   BASOSABS 0.0 02/07/2017 0501    BMET    Component Value Date/Time   NA 136 07/14/2017 0624   NA 132 (L) 09/02/2016 1158   K 4.3 07/14/2017 0624   CL 99 (L) 07/14/2017 0624   CO2 27 02/08/2017 0756   GLUCOSE 109 (H) 07/14/2017 0624   BUN 62 (H) 07/14/2017 0624   BUN 111 (HH) 09/02/2016 1158   CREATININE 6.10 (H) 07/14/2017 0624   CREATININE 4.38 (H) 03/01/2016 0856   CALCIUM 8.5 (L) 02/08/2017 0756   GFRNONAA 14 (L) 02/08/2017 0756   GFRNONAA 16 (L) 09/21/2015 1129   GFRAA 16 (L) 02/08/2017 0756   GFRAA 19 (L) 09/21/2015 1129    COAGS: Lab Results  Component Value Date   INR 1.09 02/07/2017   INR 1.0 03/01/2016   INR 1.29 09/03/2015     Non-Invasive Vascular Imaging:      ASSESSMENT/PLAN: This is a 57 y.o. male with esrd and left arm avf that has failed to mature. Plan for fistulogram. Will likely need conversion to graft if no significant intervention can be undertaken.  Yachet Mattson C. Donzetta Matters, MD [facility] [place] Office: [phone] Pager: [phone]

## 2017-07-14 NOTE — Telephone Encounter (Signed)
Russell Price confirmed she received letter of instructions and  will review with patient at dialysis. Phone call to patient's home also by this office.

## 2017-07-15 ENCOUNTER — Encounter: Payer: Self-pay | Admitting: Pharmacist

## 2017-07-15 ENCOUNTER — Telehealth: Payer: Self-pay | Admitting: Family Medicine

## 2017-07-15 ENCOUNTER — Encounter (HOSPITAL_COMMUNITY): Payer: Self-pay | Admitting: Vascular Surgery

## 2017-07-15 DIAGNOSIS — Z23 Encounter for immunization: Secondary | ICD-10-CM | POA: Diagnosis not present

## 2017-07-15 DIAGNOSIS — E1129 Type 2 diabetes mellitus with other diabetic kidney complication: Secondary | ICD-10-CM | POA: Diagnosis not present

## 2017-07-15 DIAGNOSIS — N186 End stage renal disease: Secondary | ICD-10-CM | POA: Diagnosis not present

## 2017-07-15 DIAGNOSIS — N2581 Secondary hyperparathyroidism of renal origin: Secondary | ICD-10-CM | POA: Diagnosis not present

## 2017-07-15 MED ORDER — CALCITRIOL 0.25 MCG PO CAPS: 0.2500 ug | ORAL_CAPSULE | Freq: Every day | ORAL | 6 refills | Status: DC

## 2017-07-15 MED FILL — Lidocaine HCl Local Inj 1%: INTRAMUSCULAR | Qty: 20 | Status: AC

## 2017-07-15 NOTE — Progress Notes (Signed)
PA submitted for calcitriol. Pending review by [facility].

## 2017-07-15 NOTE — Telephone Encounter (Signed)
Refilled

## 2017-07-16 ENCOUNTER — Other Ambulatory Visit: Payer: Self-pay

## 2017-07-16 ENCOUNTER — Encounter (HOSPITAL_COMMUNITY): Payer: Self-pay | Admitting: *Deleted

## 2017-07-16 NOTE — Progress Notes (Signed)
Spoke with pt for pre-op call. Pt has hx of CAD, is followed by Dr. Sallyanne Kuster. Pt denies any recent chest pain or sob. Pt is a type 2 diabetic. Last A1C was 9.1 on 06/16/17. Pt states his fasting blood sugar is usually between 100-120. Pt instructed to take 1/2 of his regular dose of Lantus Insulin Thursday PM 9 (will take 7 units). Pt instructed to check his blood sugar Friday AM when he gets up. If blood sugar is >220 take 1/2 of usual correction dose of Novolog insulin. If blood sugar is 70 or below, treat with 1/2 cup of clear juice (apple or cranberry) and recheck blood sugar 15 minutes after drinking juice. Pt voiced understanding.

## 2017-07-17 DIAGNOSIS — N186 End stage renal disease: Secondary | ICD-10-CM | POA: Diagnosis not present

## 2017-07-17 DIAGNOSIS — Z23 Encounter for immunization: Secondary | ICD-10-CM | POA: Diagnosis not present

## 2017-07-17 DIAGNOSIS — N2581 Secondary hyperparathyroidism of renal origin: Secondary | ICD-10-CM | POA: Diagnosis not present

## 2017-07-17 DIAGNOSIS — E1129 Type 2 diabetes mellitus with other diabetic kidney complication: Secondary | ICD-10-CM | POA: Diagnosis not present

## 2017-07-18 ENCOUNTER — Ambulatory Visit (HOSPITAL_COMMUNITY): Payer: Medicare Other | Admitting: Anesthesiology

## 2017-07-18 ENCOUNTER — Encounter (HOSPITAL_COMMUNITY): Payer: Self-pay | Admitting: *Deleted

## 2017-07-18 ENCOUNTER — Ambulatory Visit (HOSPITAL_COMMUNITY)
Admission: RE | Admit: 2017-07-18 | Discharge: 2017-07-18 | Disposition: A | Discharge status: Home / Self Care | Payer: Medicare Other | Source: Ambulatory Visit | Attending: Vascular Surgery | Admitting: Vascular Surgery

## 2017-07-18 ENCOUNTER — Encounter (HOSPITAL_COMMUNITY)
Admission: RE | Disposition: A | Discharge status: Home / Self Care | Payer: Self-pay | Source: Ambulatory Visit | Attending: Vascular Surgery

## 2017-07-18 DIAGNOSIS — Z87891 Personal history of nicotine dependence: Secondary | ICD-10-CM | POA: Diagnosis not present

## 2017-07-18 DIAGNOSIS — N186 End stage renal disease: Secondary | ICD-10-CM | POA: Insufficient documentation

## 2017-07-18 DIAGNOSIS — D631 Anemia in chronic kidney disease: Secondary | ICD-10-CM | POA: Diagnosis not present

## 2017-07-18 DIAGNOSIS — N2581 Secondary hyperparathyroidism of renal origin: Secondary | ICD-10-CM | POA: Diagnosis not present

## 2017-07-18 DIAGNOSIS — Z79899 Other long term (current) drug therapy: Secondary | ICD-10-CM | POA: Diagnosis not present

## 2017-07-18 DIAGNOSIS — Z992 Dependence on renal dialysis: Secondary | ICD-10-CM | POA: Insufficient documentation

## 2017-07-18 DIAGNOSIS — Y832 Surgical operation with anastomosis, bypass or graft as the cause of abnormal reaction of the patient, or of later complication, without mention of misadventure at the time of the procedure: Secondary | ICD-10-CM | POA: Insufficient documentation

## 2017-07-18 DIAGNOSIS — Z794 Long term (current) use of insulin: Secondary | ICD-10-CM | POA: Diagnosis not present

## 2017-07-18 DIAGNOSIS — D649 Anemia, unspecified: Secondary | ICD-10-CM | POA: Diagnosis not present

## 2017-07-18 DIAGNOSIS — N185 Chronic kidney disease, stage 5: Secondary | ICD-10-CM | POA: Diagnosis not present

## 2017-07-18 DIAGNOSIS — E1129 Type 2 diabetes mellitus with other diabetic kidney complication: Secondary | ICD-10-CM | POA: Diagnosis not present

## 2017-07-18 DIAGNOSIS — T82898A Other specified complication of vascular prosthetic devices, implants and grafts, initial encounter: Secondary | ICD-10-CM | POA: Diagnosis not present

## 2017-07-18 DIAGNOSIS — Z7982 Long term (current) use of aspirin: Secondary | ICD-10-CM | POA: Diagnosis not present

## 2017-07-18 DIAGNOSIS — T82590A Other mechanical complication of surgically created arteriovenous fistula, initial encounter: Secondary | ICD-10-CM | POA: Insufficient documentation

## 2017-07-18 DIAGNOSIS — I120 Hypertensive chronic kidney disease with stage 5 chronic kidney disease or end stage renal disease: Secondary | ICD-10-CM | POA: Diagnosis not present

## 2017-07-18 DIAGNOSIS — I2510 Atherosclerotic heart disease of native coronary artery without angina pectoris: Secondary | ICD-10-CM | POA: Diagnosis not present

## 2017-07-18 DIAGNOSIS — E1122 Type 2 diabetes mellitus with diabetic chronic kidney disease: Secondary | ICD-10-CM | POA: Diagnosis not present

## 2017-07-18 HISTORY — PX: AV FISTULA PLACEMENT: SHX1204

## 2017-07-18 HISTORY — PX: LIGATION OF ARTERIOVENOUS  FISTULA: SHX5948

## 2017-07-18 LAB — POCT I-STAT 4, (NA,K, GLUC, HGB,HCT)
Glucose, Bld: 158 mg/dL — ABNORMAL HIGH (ref 65–99)
HEMATOCRIT: 32 % — AB (ref 39.0–52.0)
HEMOGLOBIN: 10.9 g/dL — AB (ref 13.0–17.0)
Potassium: 4.6 mmol/L (ref 3.5–5.1)
Sodium: 135 mmol/L (ref 135–145)

## 2017-07-18 LAB — GLUCOSE, CAPILLARY
Glucose-Capillary: 157 mg/dL — ABNORMAL HIGH (ref 65–99)
Glucose-Capillary: 123 mg/dL — ABNORMAL HIGH (ref 65–99)

## 2017-07-18 SURGERY — INSERTION OF ARTERIOVENOUS (AV) GORE-TEX GRAFT ARM
Anesthesia: General | Site: Arm Upper | Laterality: Left

## 2017-07-18 MED ORDER — LIDOCAINE HCL (PF) 1 % IJ SOLN
INTRAMUSCULAR | Status: AC
  Filled 2017-07-18: qty 30

## 2017-07-18 MED ORDER — PHENYLEPHRINE 40 MCG/ML (10ML) SYRINGE FOR IV PUSH (FOR BLOOD PRESSURE SUPPORT)
PREFILLED_SYRINGE | INTRAVENOUS | Status: AC
  Filled 2017-07-18: qty 10

## 2017-07-18 MED ORDER — FENTANYL CITRATE (PF) 100 MCG/2ML IJ SOLN
INTRAMUSCULAR | Status: DC | PRN
  Administered 2017-07-18: 50 ug via INTRAVENOUS
  Administered 2017-07-18 (×2): 25 ug via INTRAVENOUS

## 2017-07-18 MED ORDER — FENTANYL CITRATE (PF) 100 MCG/2ML IJ SOLN
25.0000 ug | INTRAMUSCULAR | Status: DC | PRN
  Administered 2017-07-18: 50 ug via INTRAVENOUS
  Administered 2017-07-18: 25 ug via INTRAVENOUS

## 2017-07-18 MED ORDER — ONDANSETRON HCL 4 MG/2ML IJ SOLN
INTRAMUSCULAR | Status: DC | PRN
  Administered 2017-07-18: 4 mg via INTRAVENOUS

## 2017-07-18 MED ORDER — PROTAMINE SULFATE 10 MG/ML IV SOLN
INTRAVENOUS | Status: AC
  Filled 2017-07-18: qty 5

## 2017-07-18 MED ORDER — FENTANYL CITRATE (PF) 250 MCG/5ML IJ SOLN
INTRAMUSCULAR | Status: AC
  Filled 2017-07-18: qty 5

## 2017-07-18 MED ORDER — SODIUM CHLORIDE 0.9 % IV SOLN
INTRAVENOUS | Status: DC
  Administered 2017-07-18 (×3): via INTRAVENOUS

## 2017-07-18 MED ORDER — PHENYLEPHRINE HCL 10 MG/ML IJ SOLN
INTRAMUSCULAR | Status: DC | PRN
  Administered 2017-07-18: 50 ug/min via INTRAVENOUS

## 2017-07-18 MED ORDER — EPHEDRINE SULFATE 50 MG/ML IJ SOLN
INTRAMUSCULAR | Status: DC | PRN
  Administered 2017-07-18 (×2): 10 mg via INTRAVENOUS

## 2017-07-18 MED ORDER — PROTAMINE SULFATE 10 MG/ML IV SOLN
INTRAVENOUS | Status: DC | PRN
  Administered 2017-07-18: 30 mg via INTRAVENOUS

## 2017-07-18 MED ORDER — LIDOCAINE HCL (CARDIAC) 20 MG/ML IV SOLN
INTRAVENOUS | Status: DC | PRN
  Administered 2017-07-18: 60 mg via INTRAVENOUS

## 2017-07-18 MED ORDER — HEMOSTATIC AGENTS (NO CHARGE) OPTIME
TOPICAL | Status: DC | PRN
  Administered 2017-07-18: 1 via TOPICAL

## 2017-07-18 MED ORDER — LIDOCAINE HCL (PF) 1 % IJ SOLN
INTRAMUSCULAR | Status: DC | PRN
  Administered 2017-07-18: 30 mL

## 2017-07-18 MED ORDER — EPHEDRINE 5 MG/ML INJ
INTRAVENOUS | Status: AC
  Filled 2017-07-18: qty 10

## 2017-07-18 MED ORDER — OXYCODONE HCL 5 MG PO TABS: 5.0000 mg | ORAL_TABLET | Freq: Four times a day (QID) | ORAL | 0 refills | Status: DC | PRN

## 2017-07-18 MED ORDER — CHLORHEXIDINE GLUCONATE 4 % EX LIQD: 60.0000 mL | Freq: Once | CUTANEOUS | Status: DC

## 2017-07-18 MED ORDER — HEPARIN SODIUM (PORCINE) 5000 UNIT/ML IJ SOLN
INTRAMUSCULAR | Status: DC | PRN
  Administered 2017-07-18: 500 mL

## 2017-07-18 MED ORDER — OXYCODONE HCL 5 MG/5ML PO SOLN: 5.0000 mg | Freq: Once | ORAL | Status: DC | PRN

## 2017-07-18 MED ORDER — FENTANYL CITRATE (PF) 100 MCG/2ML IJ SOLN
INTRAMUSCULAR | Status: AC
  Filled 2017-07-18: qty 2

## 2017-07-18 MED ORDER — SODIUM CHLORIDE 0.9 % IV SOLN
INTRAVENOUS | Status: AC
  Filled 2017-07-18: qty 1.5

## 2017-07-18 MED ORDER — PROPOFOL 10 MG/ML IV BOLUS
INTRAVENOUS | Status: AC
  Filled 2017-07-18: qty 20

## 2017-07-18 MED ORDER — SODIUM CHLORIDE 0.9 % IV SOLN
1.5000 g | INTRAVENOUS | Status: AC
  Administered 2017-07-18: 1.5 g via INTRAVENOUS

## 2017-07-18 MED ORDER — HEPARIN SODIUM (PORCINE) 1000 UNIT/ML IJ SOLN
INTRAMUSCULAR | Status: AC
  Filled 2017-07-18: qty 1

## 2017-07-18 MED ORDER — PROPOFOL 10 MG/ML IV BOLUS
INTRAVENOUS | Status: DC | PRN
  Administered 2017-07-18: 200 mg via INTRAVENOUS

## 2017-07-18 MED ORDER — OXYCODONE HCL 5 MG PO TABS: 5.0000 mg | ORAL_TABLET | Freq: Once | ORAL | Status: DC | PRN

## 2017-07-18 MED ORDER — ONDANSETRON HCL 4 MG/2ML IJ SOLN
INTRAMUSCULAR | Status: AC
  Filled 2017-07-18: qty 2

## 2017-07-18 MED ORDER — 0.9 % SODIUM CHLORIDE (POUR BTL) OPTIME
TOPICAL | Status: DC | PRN
  Administered 2017-07-18: 1000 mL

## 2017-07-18 MED ORDER — ONDANSETRON HCL 4 MG/2ML IJ SOLN: 4.0000 mg | Freq: Four times a day (QID) | INTRAMUSCULAR | Status: DC | PRN

## 2017-07-18 MED ORDER — HEPARIN SODIUM (PORCINE) 1000 UNIT/ML IJ SOLN
INTRAMUSCULAR | Status: DC | PRN
  Administered 2017-07-18: 5000 [IU] via INTRAVENOUS

## 2017-07-18 SURGICAL SUPPLY — 42 items
ARMBAND PINK RESTRICT EXTREMIT (MISCELLANEOUS) ×6 IMPLANT
CANISTER SUCT 3000ML PPV (MISCELLANEOUS) ×3 IMPLANT
CLIP VESOCCLUDE MED 6/CT (CLIP) ×3 IMPLANT
CLIP VESOCCLUDE SM WIDE 6/CT (CLIP) ×3 IMPLANT
COVER PROBE W GEL 5X96 (DRAPES) ×3 IMPLANT
DECANTER SPIKE VIAL GLASS SM (MISCELLANEOUS) ×3 IMPLANT
DERMABOND ADVANCED (GAUZE/BANDAGES/DRESSINGS) ×2
DERMABOND ADVANCED .7 DNX12 (GAUZE/BANDAGES/DRESSINGS) ×1 IMPLANT
ELECT REM PT RETURN 9FT ADLT (ELECTROSURGICAL) ×3
ELECTRODE REM PT RTRN 9FT ADLT (ELECTROSURGICAL) ×1 IMPLANT
GAUZE SPONGE 4X4 12PLY STRL (GAUZE/BANDAGES/DRESSINGS) IMPLANT
GAUZE SPONGE 4X4 16PLY XRAY LF (GAUZE/BANDAGES/DRESSINGS) ×3 IMPLANT
GLOVE BIO SURGEON STRL SZ7 (GLOVE) ×3 IMPLANT
GLOVE BIOGEL PI IND STRL 6.5 (GLOVE) ×4 IMPLANT
GLOVE BIOGEL PI IND STRL 7.5 (GLOVE) ×2 IMPLANT
GLOVE BIOGEL PI INDICATOR 6.5 (GLOVE) ×8
GLOVE BIOGEL PI INDICATOR 7.5 (GLOVE) ×4
GLOVE ECLIPSE 6.5 STRL STRAW (GLOVE) ×3 IMPLANT
GLOVE ECLIPSE 7.0 STRL STRAW (GLOVE) ×6 IMPLANT
GOWN STRL REUS W/ TWL LRG LVL3 (GOWN DISPOSABLE) ×5 IMPLANT
GOWN STRL REUS W/TWL LRG LVL3 (GOWN DISPOSABLE) ×10
GRAFT GORETEX STRT 4-7X45 (Vascular Products) ×3 IMPLANT
HEMOSTAT SPONGE AVITENE ULTRA (HEMOSTASIS) ×3 IMPLANT
KIT BASIN OR (CUSTOM PROCEDURE TRAY) ×3 IMPLANT
KIT ROOM TURNOVER OR (KITS) ×3 IMPLANT
NS IRRIG 1000ML POUR BTL (IV SOLUTION) ×3 IMPLANT
PACK CV ACCESS (CUSTOM PROCEDURE TRAY) ×3 IMPLANT
PAD ARMBOARD 7.5X6 YLW CONV (MISCELLANEOUS) ×6 IMPLANT
SPONGE LAP 18X18 X RAY DECT (DISPOSABLE) ×3 IMPLANT
SUT ETHILON 3 0 PS 1 (SUTURE) IMPLANT
SUT GORETEX 6.0 TT13 (SUTURE) IMPLANT
SUT MNCRL AB 4-0 PS2 18 (SUTURE) ×6 IMPLANT
SUT PROLENE 6 0 BV (SUTURE) ×21 IMPLANT
SUT PROLENE 7 0 BV 1 (SUTURE) ×3 IMPLANT
SUT SILK 0 TIES 10X30 (SUTURE) ×3 IMPLANT
SUT SILK 2 0 PERMA HAND 18 BK (SUTURE) IMPLANT
SUT VIC AB 3-0 SH 27 (SUTURE) ×4
SUT VIC AB 3-0 SH 27X BRD (SUTURE) ×2 IMPLANT
SYR TOOMEY 50ML (SYRINGE) IMPLANT
TOWEL GREEN STERILE (TOWEL DISPOSABLE) ×3 IMPLANT
UNDERPAD 30X30 (UNDERPADS AND DIAPERS) ×3 IMPLANT
WATER STERILE IRR 1000ML POUR (IV SOLUTION) ×3 IMPLANT

## 2017-07-18 NOTE — Transfer of Care (Signed)
Immediate Anesthesia Transfer of Care Note  Patient: Russell Price  Procedure(s) Performed: INSERTION OF ARTERIOVENOUS (AV) GORE-TEX GRAFT ARM LEFT UPPER ARM (Left Arm Upper) LIGATION OF ARTERIOVENOUS  FISTULA (Left Arm Upper)  Patient Location: PACU  Anesthesia Type:General  Level of Consciousness: awake and alert   Airway & Oxygen Therapy: Patient Spontanous Breathing and Patient connected to face mask oxygen  Post-op Assessment: Report given to RN, Post -op Vital signs reviewed and stable and Patient moving all extremities X 4  Post vital signs: Reviewed and stable  Last Vitals:  Vitals:   07/18/17 0601  BP: (!) 193/70  Pulse: 78  Resp: 18  Temp: 36.8 C  SpO2: 100%    Last Pain:  Vitals:   07/18/17 0601  TempSrc: Oral      Patients Stated Pain Goal: 3 (16/96/78 9381)  Complications: No apparent anesthesia complications

## 2017-07-18 NOTE — Discharge Instructions (Signed)
° °  [facility] ° °Discharge Instructions ° °AV Fistula or Graft Surgery for Dialysis Access ° °Please refer to the following instructions for your post-procedure care. Your surgeon or physician assistant will discuss any changes with you. ° °Activity ° °You may drive the day following your surgery, if you are comfortable and no longer taking prescription pain medication. Resume full activity as the soreness in your incision resolves. ° °Bathing/Showering ° °You may shower after you go home. Keep your incision dry for 48 hours. Do not soak in a bathtub, hot tub, or swim until the incision heals completely. You may not shower if you have a hemodialysis catheter. ° °Incision Care ° °Clean your incision with mild soap and water after 48 hours. Pat the area dry with a clean towel. You do not need a bandage unless otherwise instructed. Do not apply any ointments or creams to your incision. You may have skin glue on your incision. Do not peel it off. It will come off on its own in about one week. Your arm may swell a bit after surgery. To reduce swelling use pillows to elevate your arm so it is above your heart. Your doctor will tell you if you need to lightly wrap your arm with an ACE bandage. ° °Diet ° °Resume your normal diet. There are not special food restrictions following this procedure. In order to heal from your surgery, it is CRITICAL to get adequate nutrition. Your body requires vitamins, minerals, and protein. Vegetables are the best source of vitamins and minerals. Vegetables also provide the perfect balance of protein. Processed food has little nutritional value, so try to avoid this. ° °Medications ° °Resume taking all of your medications. If your incision is causing pain, you may take over-the counter pain relievers such as acetaminophen (Tylenol). If you were prescribed a stronger pain medication, please be aware these medications can cause nausea and constipation. Prevent  nausea by taking the medication with a snack or meal. Avoid constipation by drinking plenty of fluids and eating foods with high amount of fiber, such as fruits, vegetables, and grains. Do not take Tylenol if you are taking prescription pain medications. ° ° ° ° °Follow up °Your surgeon may want to see you in the office following your access surgery. If so, this will be arranged at the time of your surgery. ° °Please call us immediately for any of the following conditions: ° °Increased pain, redness, drainage (pus) from your incision site °Fever of 101 degrees or higher °Severe or worsening pain at your incision site °Hand pain or numbness. ° °Reduce your risk of vascular disease: ° °Stop smoking. If you would like help, call [facility] at 1-800-QUIT-NOW (1-800-784-8669) or [facility] at 336-586-4000 ° °Manage your cholesterol °Maintain a desired weight °Control your diabetes °Keep your blood pressure down ° °Dialysis ° °It will take several weeks to several months for your new dialysis access to be ready for use. Your surgeon will determine when it is OK to use it. Your nephrologist will continue to direct your dialysis. You can continue to use your Permcath until your new access is ready for use. ° °If you have any questions, please call the office at [phone]. ° °

## 2017-07-18 NOTE — Anesthesia Procedure Notes (Signed)
Procedure Name: LMA Insertion Date/Time: 07/18/2017 7:42 AM Performed by: Inda Coke, CRNA Pre-anesthesia Checklist: Patient identified, Emergency Drugs available, Suction available and Patient being monitored Patient Re-evaluated:Patient Re-evaluated prior to induction Oxygen Delivery Method: Circle System Utilized Preoxygenation: Pre-oxygenation with 100% oxygen Induction Type: IV induction Ventilation: Mask ventilation without difficulty LMA: LMA inserted LMA Size: 4.0 Number of attempts: 1 Airway Equipment and Method: Bite block Placement Confirmation: positive ETCO2 and breath sounds checked- equal and bilateral Tube secured with: Tape Dental Injury: Teeth and Oropharynx as per pre-operative assessment

## 2017-07-18 NOTE — Interval H&P Note (Signed)
History and Physical Interval Note:  07/18/2017 7:14 AM  Russell Price  has presented today for surgery, with the diagnosis of END STAGE RENAL DISEASE FOR HENODIALYSIS ACCESS  The various methods of treatment have been discussed with the patient and family. After consideration of risks, benefits and other options for treatment, the patient has consented to  Procedure(s): INSERTION OF ARTERIOVENOUS (AV) GORE-TEX GRAFT ARM LEFT UPPER ARM (Left) LIGATION OF ARTERIOVENOUS  FISTULA (Left) as a surgical intervention .  The patient's history has been reviewed, patient examined, no change in status, stable for surgery.  I have reviewed the patient's chart and labs.  Questions were answered to the patient's satisfaction.     Adele Barthel

## 2017-07-18 NOTE — Anesthesia Preprocedure Evaluation (Signed)
Anesthesia Evaluation  Patient identified by MRN, date of birth, ID band Patient awake    Reviewed: Allergy & Precautions, H&P , NPO status , Patient's Chart, lab work & pertinent test results  Airway Mallampati: II   Neck ROM: full    Dental   Pulmonary former smoker,    breath sounds clear to auscultation       Cardiovascular hypertension, + CAD and + Peripheral Vascular Disease   Rhythm:regular Rate:Normal     Neuro/Psych CVA    GI/Hepatic   Endo/Other  diabetes, Type 2  Renal/GU ESRF and DialysisRenal disease     Musculoskeletal   Abdominal   Peds  Hematology   Anesthesia Other Findings   Reproductive/Obstetrics                             Anesthesia Physical Anesthesia Plan  ASA: III  Anesthesia Plan: General   Post-op Pain Management:    Induction: Intravenous  PONV Risk Score and Plan: 2 and Ondansetron  Airway Management Planned: LMA  Additional Equipment:   Intra-op Plan:   Post-operative Plan:   Informed Consent: I have reviewed the patients History and Physical, chart, labs and discussed the procedure including the risks, benefits and alternatives for the proposed anesthesia with the patient or authorized representative who has indicated his/her understanding and acceptance.     Plan Discussed with: CRNA, Anesthesiologist and Surgeon  Anesthesia Plan Comments:         Anesthesia Quick Evaluation

## 2017-07-18 NOTE — Op Note (Signed)
OPERATIVE NOTE   PROCEDURE:  1.  Ligation of left arm arteriovenous fistula  2.  Left upper arm arteriovenous graft  PRE-OPERATIVE DIAGNOSIS: end stage renal disease   POST-OPERATIVE DIAGNOSIS: same as above   SURGEON: Adele Barthel, MD  ASSISTANT(S): Gerri Lins, PAC   ANESTHESIA: general  ESTIMATED BLOOD LOSS: 50 cc  FINDING(S):  Only the one deep brachial vein large enough for use for access purposes  Palpable thrill in graft at end of the case with pulsatile character  Dopplerable radial artery at end of case  SPECIMEN(S):  none  INDICATIONS:   Russell Price is a 57 y.o. male who presents with end stage renal disease requiring hemodialysis.  The patient's previous left arteriovenous fistula has undergone repeated fistulogram and intervention without success.  Dr. Donzetta Matters recommended: ligation of left arm arteriovenous graft and placement of left upper arm arteriovenous graft.  Risk, benefits, and alternatives to access surgery were discussed.  The patient is aware the risks include but are not limited to: bleeding, infection, steal syndrome, nerve damage, ischemic monomelic neuropathy, failure to mature, and need for additional procedures.  The patient is aware of the risks and elects to proceed forward.   DESCRIPTION: After full informed written consent was obtained from the patient, the patient was brought back to the operating room and placed supine upon the operating table.  The patient was given IV antibiotics prior to proceeding.  After obtaining adequate sedation, the patient was prepped and draped in standard fashion for a left arm access procedure.    I turned my attention first to the antecubitum.  Under ultrasound guidance, I identified the location of the brachial artery and marked it on the skin.  I then examined the bicipital groove and identified the high brachial vein and marked it on the skin.    I made an incision over the previous incision overlying  the brachial artery and dissected through the subcutaneous tissue to the fascia carefully and was able to dissect out the distal fistula.  This was extremely difficult due to significant scarring.  The fistula was pulsatile and about 5-6 mm externally.  I placed a vessel loop around the fistula.  On Sonosite, the artery appeared to be only 3 mm so I planned to use the distal fistula as the anastomosis for this arteriovenous graft.    I turned my attention to the high bicipital groove.  I made an incision the prior incision overlying the proximal vein and dissected through the subcutaneous tissue and fascia until I reached the high brachial vein.  Externally, it appeared to be 4-5 mm in diameter.  I then dissected out this vein proximally and distally.  This vein was deep and inferior to the brachial artery.  The median nerve was encountered in the process of this dissection.  I took a Dietitian and dissected from the arterial exposure up to the venous exposure.  I then delivered the 4 x 7-mm stretch Gore-Tex graft through the metal tunnel and then pulled out the metal tunnel leaving the graft in place.  The 4-mm end of the graft was left in the arterial exposure and the 7 mm graft was left in the venous exposure.    I then gave the patient 5000 units of heparin to gain some anticoagulation.  After waiting 3 minutes, I clamped the distal arteriovenous fistula and then tied of the fistula in the arterial exposure proximally.  I then transected the fistula, leaving a  1-2 cm vein cuff on the brachial artery.  I completed a limited endarterectomy of some neointimal hyperplasia in the vein.  There was excellent blood flow through this segment of vein.    I sewed the 4-mm end of the graft to this vein cuff with a running stitch of 6-0 Prolene in an end-to-end anastomosis.  I released the clamp on the vein cuff and allowed the artery to decompress through the graft. There was good pulsatile bleeding from  this graft.  I clamped the graft near its arterial anastomosis and sucked out all the blood in the graft and loaded the graft with heparinized saline.    At this point, I pulled the graft to appropriate length and reset my exposure of the axillary vein.  I tied off the vein distally with a 2-0 silk and then transected it.  There was limited venous backbleeding from the vein.  I then injected some heparinized saline into this vein and then clamped it.  I then spatulated the vein to facilitate an end-to-end anastomosis.  I cut the graft to appropriate length for this anastomosis.  This graft was sewn to the vein in an end-to-end configuration with a 6-0 Prolene.  Prior to completing this anastomosis, I allowed the vein to backbleed.  I also allowed the graft to bleed in an antegrade fashion.  There was no clot observe from either end of the anastomosis.  I completed this anastomosis in the usual fashion.  At this point, I irrigated out the venous exposure and then packed Avitene in this incision.  I turned my attention back to the arterial exposure.  This incision was washed out also.  I repacked the incision with Avitene.  I tested the distal arterial flow with a continuous doppler: Dopplerable radial artery.  The brachial artery proximally and distally had multiphasic waveforms.  The venous outflow had a flow signature: consistent with patent arteriovenous graft with pulsatile character.  I dissected out the venous anastomosis and no obvious compression of the vein was evident, suggesting possible proximal venous stenosis.    At this point, I washed out the arterial exposure.  There was one bleeding point that was repaired with a 6-0 Prolene.  The subcutaneous tissue was reapproximated with a running stitch of 3-0 Vicryl.  The skin was then reapproximated with a running subcuticular 4-0 Monocryl.  The skin was then cleaned, dried, and Dermabond used to reinforce the skin closure.  I noticed a few bleeding  points in the venous anastomosis, caused by pleats in the vein to graft suture line.  I repeated these pleats with 6-0 Prolene sutures.  The subcutaneous tissue was reapproximated with a running stitch of 3-0 Vicryl.  The skin was then reapproximated with a running subcuticular 4-0 Monocryl.  The skin was then cleaned, dried, and Dermabond used to reinforce the skin closure.   COMPLICATIONS: none  CONDITION: stable   Adele Barthel, MD, [facility] Office: [phone] Pager: [phone]  07/18/2017, 10:07 AM

## 2017-07-19 DIAGNOSIS — D649 Anemia, unspecified: Secondary | ICD-10-CM | POA: Diagnosis not present

## 2017-07-19 DIAGNOSIS — E1129 Type 2 diabetes mellitus with other diabetic kidney complication: Secondary | ICD-10-CM | POA: Diagnosis not present

## 2017-07-19 DIAGNOSIS — N186 End stage renal disease: Secondary | ICD-10-CM | POA: Diagnosis not present

## 2017-07-19 DIAGNOSIS — N2581 Secondary hyperparathyroidism of renal origin: Secondary | ICD-10-CM | POA: Diagnosis not present

## 2017-07-21 ENCOUNTER — Telehealth: Payer: Self-pay | Admitting: Vascular Surgery

## 2017-07-21 ENCOUNTER — Other Ambulatory Visit: Payer: Self-pay

## 2017-07-21 ENCOUNTER — Encounter (HOSPITAL_COMMUNITY): Payer: Self-pay | Admitting: Vascular Surgery

## 2017-07-21 DIAGNOSIS — Z8673 Personal history of transient ischemic attack (TIA), and cerebral infarction without residual deficits: Secondary | ICD-10-CM | POA: Diagnosis not present

## 2017-07-21 DIAGNOSIS — N186 End stage renal disease: Secondary | ICD-10-CM | POA: Diagnosis not present

## 2017-07-21 DIAGNOSIS — G8918 Other acute postprocedural pain: Secondary | ICD-10-CM | POA: Insufficient documentation

## 2017-07-21 DIAGNOSIS — Z79899 Other long term (current) drug therapy: Secondary | ICD-10-CM | POA: Insufficient documentation

## 2017-07-21 DIAGNOSIS — Z87891 Personal history of nicotine dependence: Secondary | ICD-10-CM | POA: Insufficient documentation

## 2017-07-21 DIAGNOSIS — E119 Type 2 diabetes mellitus without complications: Secondary | ICD-10-CM | POA: Diagnosis not present

## 2017-07-21 DIAGNOSIS — I120 Hypertensive chronic kidney disease with stage 5 chronic kidney disease or end stage renal disease: Secondary | ICD-10-CM | POA: Insufficient documentation

## 2017-07-21 DIAGNOSIS — Z992 Dependence on renal dialysis: Secondary | ICD-10-CM | POA: Diagnosis not present

## 2017-07-21 DIAGNOSIS — I2510 Atherosclerotic heart disease of native coronary artery without angina pectoris: Secondary | ICD-10-CM | POA: Insufficient documentation

## 2017-07-21 DIAGNOSIS — Z794 Long term (current) use of insulin: Secondary | ICD-10-CM | POA: Insufficient documentation

## 2017-07-21 DIAGNOSIS — Z7982 Long term (current) use of aspirin: Secondary | ICD-10-CM | POA: Insufficient documentation

## 2017-07-21 DIAGNOSIS — M79622 Pain in left upper arm: Secondary | ICD-10-CM | POA: Diagnosis not present

## 2017-07-21 DIAGNOSIS — I517 Cardiomegaly: Secondary | ICD-10-CM | POA: Diagnosis not present

## 2017-07-21 LAB — COMPREHENSIVE METABOLIC PANEL
ALBUMIN: 3.7 g/dL (ref 3.5–5.0)
ALK PHOS: 113 U/L (ref 38–126)
ALT: 18 U/L (ref 17–63)
ANION GAP: 15 (ref 5–15)
AST: 18 U/L (ref 15–41)
BUN: 56 mg/dL — ABNORMAL HIGH (ref 6–20)
CALCIUM: 9.1 mg/dL (ref 8.9–10.3)
CO2: 26 mmol/L (ref 22–32)
Chloride: 90 mmol/L — ABNORMAL LOW (ref 101–111)
Creatinine, Ser: 6.37 mg/dL — ABNORMAL HIGH (ref 0.61–1.24)
GFR calc Af Amer: 10 mL/min — ABNORMAL LOW (ref >60)
GFR calc non Af Amer: 9 mL/min — ABNORMAL LOW (ref >60)
GLUCOSE: 235 mg/dL — AB (ref 65–99)
Potassium: 4.7 mmol/L (ref 3.5–5.1)
SODIUM: 131 mmol/L — AB (ref 135–145)
Total Bilirubin: 1.1 mg/dL (ref 0.3–1.2)
Total Protein: 7.8 g/dL (ref 6.5–8.1)

## 2017-07-21 LAB — CBC WITH DIFFERENTIAL/PLATELET
BASOS PCT: 0 %
Basophils Absolute: 0 10*3/uL (ref 0.0–0.1)
EOS ABS: 0.4 10*3/uL (ref 0.0–0.7)
Eosinophils Relative: 7 %
HCT: 29.8 % — ABNORMAL LOW (ref 39.0–52.0)
HEMOGLOBIN: 9.8 g/dL — AB (ref 13.0–17.0)
Lymphocytes Relative: 30 %
Lymphs Abs: 1.8 10*3/uL (ref 0.7–4.0)
MCH: 29.3 pg (ref 26.0–34.0)
MCHC: 32.9 g/dL (ref 30.0–36.0)
MCV: 89 fL (ref 78.0–100.0)
Monocytes Absolute: 0.4 10*3/uL (ref 0.1–1.0)
Monocytes Relative: 6 %
NEUTROS PCT: 57 %
Neutro Abs: 3.4 10*3/uL (ref 1.7–7.7)
Platelets: 90 10*3/uL — ABNORMAL LOW (ref 150–400)
RBC: 3.35 MIL/uL — AB (ref 4.22–5.81)
RDW: 13.4 % (ref 11.5–15.5)
WBC: 6 10*3/uL (ref 4.0–10.5)

## 2017-07-21 NOTE — Telephone Encounter (Signed)
-----   Message from Mena Goes, RN sent at 07/18/2017 10:37 AM EST ----- Regarding: 2-3 weeks post op   ----- Message ----- From: Ulyses Amor, PA-C Sent: 07/18/2017  10:31 AM To: [facility] Charge Pool  F/U in office in 2-3 weeks on Wednesday PA clinic s/p left arm av graft

## 2017-07-21 NOTE — Telephone Encounter (Signed)
Sched appt 08/13/17 at 3:00 for Wednesday PA clinic. Spoke to pt, pt said they only have transportation on Mondays. Reched for Monday 08/11/17 at 1:00 with "PA TO SEE".

## 2017-07-21 NOTE — ED Notes (Signed)
EKG done in triage. Did not cross over in [place]. Hardcopy available in triage.

## 2017-07-21 NOTE — ED Triage Notes (Addendum)
Pt st's he had fistula removed from left arm on 3/1.  Pt st's arm started swelling on 3/2 and has continued to get worse.  Pt also c/o pain to the area.  Arm swollen down to hand.  Hand cool to touch.  Radial pulse with doppler.

## 2017-07-22 ENCOUNTER — Emergency Department (HOSPITAL_COMMUNITY)
Admission: EM | Admit: 2017-07-22 | Discharge: 2017-07-22 | Disposition: A | Discharge status: Home / Self Care | Payer: Medicare Other | Attending: Emergency Medicine | Admitting: Emergency Medicine

## 2017-07-22 DIAGNOSIS — G8918 Other acute postprocedural pain: Secondary | ICD-10-CM

## 2017-07-22 DIAGNOSIS — D649 Anemia, unspecified: Secondary | ICD-10-CM | POA: Diagnosis not present

## 2017-07-22 DIAGNOSIS — N186 End stage renal disease: Secondary | ICD-10-CM | POA: Diagnosis not present

## 2017-07-22 DIAGNOSIS — E1129 Type 2 diabetes mellitus with other diabetic kidney complication: Secondary | ICD-10-CM | POA: Diagnosis not present

## 2017-07-22 DIAGNOSIS — N2581 Secondary hyperparathyroidism of renal origin: Secondary | ICD-10-CM | POA: Diagnosis not present

## 2017-07-22 MED ORDER — OXYCODONE-ACETAMINOPHEN 5-325 MG PO TABS
1.0000 | ORAL_TABLET | Freq: Once | ORAL | Status: AC
  Administered 2017-07-22: 1 via ORAL
  Filled 2017-07-22: qty 1

## 2017-07-22 MED ORDER — OXYCODONE HCL 5 MG PO TABS: 5.0000 mg | ORAL_TABLET | Freq: Four times a day (QID) | ORAL | 0 refills | Status: DC | PRN

## 2017-07-22 NOTE — ED Notes (Signed)
ED Provider at bedside. 

## 2017-07-22 NOTE — ED Provider Notes (Signed)
[facility] EMERGENCY DEPARTMENT Provider Note   CSN: [id number] Arrival date & time: 07/21/17  2033     History   Chief Complaint Chief Complaint  Patient presents with  . Arm Swelling    HPI Russell Price is a 57 y.o. male.  Patient with a history of ESRD-HD T-Th-S, HTN, CVA, CAD, glaucoma presents 4 days after vascular surgery for dialysis graft in left upper arm with concern for increased pain and swelling of the extremity. No fever. He reports taking his antibiotics. Chart reviewed. On 07/18/17 Dr. Donzetta Matters performed a fistulagram to the existing fistula and determined that it would need to be replaced with graft. This was done by Dr. Bridgett Larsson. The patient reports that today his swelling and pain increased substantially which caused concern.    The history is provided by the patient and the spouse. No language interpreter was used.    Past Medical History:  Diagnosis Date  . CAD (coronary artery disease)    a. underwent cath in 02/2016 after an abnormal nuc which showed 2V CAD in RCA and OM3 with no good targets for PCI. Medical managment recommended.   . Chronic kidney disease   . Diabetes mellitus without complication ([place])    type II - followed by Dr Chalmers Cater   . Difficult intubation    ER intubation 2016  . ESRD (end stage renal disease) ([place])    Tues, Th, Sat [place]  . Glaucoma   . Hemodialysis patient [facility])    has been on dialysis since approx 10/2016   . Hypertension    followed by [facility]  . Stroke [facility])    patient denies on 02/19/2017     Patient Active Problem List   Diagnosis Date Noted  . Sepsis ([place]) 02/06/2017  . Ascites   . Hyponatremia 11/06/2016  . Fluid overload 11/06/2016  . CAD (coronary artery disease)   . Abnormal nuclear stress test 03/04/2016  . ESRD (end stage renal disease) ([place])   . Diabetic neuropathy ([place]) 10/05/2015  . Left arm weakness   . Acute encephalopathy   . Hyperlipidemia 09/21/2015  .  Labile blood pressure   . Labile blood glucose   . Diabetes mellitus type 2 in nonobese (H[facility])   . Anemia of chronic disease   . Debility 09/08/2015  . Acidemia   . Seizure-like activity ([place])   . Acute kidney injury ([place])   . Cerebral thrombosis with cerebral infarction ([place]) 02/18/2015  . Essential hypertension 11/11/2014  . DM (diabetes mellitus) with complications ([place]) 73/22/0254  . Blind right eye 11/11/2014    Past Surgical History:  Procedure Laterality Date  . A/V FISTULAGRAM Left 11/27/2016   Procedure: A/V Fistulagram;  Surgeon: Waynetta Sandy, MD;  Location: [place] CV LAB;  Service: Cardiovascular;  Laterality: Left;  . A/V FISTULAGRAM Left 05/07/2017   Procedure: A/V FISTULAGRAM;  Surgeon: Waynetta Sandy, MD;  Location: [place] CV LAB;  Service: Cardiovascular;  Laterality: Left;  . A/V FISTULAGRAM Left 07/14/2017   Procedure: A/V FISTULAGRAM;  Surgeon: Waynetta Sandy, MD;  Location: [place] CV LAB;  Service: Cardiovascular;  Laterality: Left;  . AV FISTULA PLACEMENT Left 03/12/2016   Procedure: Left Arm ARTERIOVENOUS (AV) FISTULA CREATION;  Surgeon: Waynetta Sandy, MD;  Location: [facility] OR;  Service: Vascular;  Laterality: Left;  . AV FISTULA PLACEMENT Left 07/18/2017   Procedure: INSERTION OF ARTERIOVENOUS (AV) GORE-TEX GRAFT ARM LEFT UPPER ARM;  Surgeon: Conrad St. Johns,  MD;  Location: [place];  Service: Vascular;  Laterality: Left;  . BASCILIC VEIN TRANSPOSITION Left 05/14/2016   Procedure: SECOND STAGE LEFT BASILIC VEIN TRANSPOSITION;  Surgeon: Waynetta Sandy, MD;  Location: [place];  Service: Vascular;  Laterality: Left;  . BRONCHOSCOPY  02/14/2015   for pulm hemorrhage  . CARDIAC CATHETERIZATION N/A 03/04/2016   Procedure: Left Heart Cath and Coronary Angiography;  Surgeon: Leonie Man, MD;  Location: [place] CV LAB;  Service: Cardiovascular;  Laterality: N/A;  . COLONOSCOPY WITH PROPOFOL N/A 04/07/2017    Procedure: COLONOSCOPY WITH PROPOFOL;  Surgeon: Ronnette Juniper, MD;  Location: [place];  Service: Gastroenterology;  Laterality: N/A;  . EYE SURGERY Left   . INSERTION OF DIALYSIS CATHETER Right 11/08/2016   Procedure: INSERTION OF DIALYSIS CATHETER;  Surgeon: Rosetta Posner, MD;  Location: [place];  Service: Vascular;  Laterality: Right;  . IR PARACENTESIS  11/13/2016  . LIGATION OF ARTERIOVENOUS  FISTULA Left 07/18/2017   Procedure: LIGATION OF ARTERIOVENOUS  FISTULA;  Surgeon: Conrad Granada, MD;  Location: [place];  Service: Vascular;  Laterality: Left;  . PERIPHERAL VASCULAR BALLOON ANGIOPLASTY Left 11/27/2016   Procedure: Peripheral Vascular Balloon Angioplasty;  Surgeon: Waynetta Sandy, MD;  Location: [place] CV LAB;  Service: Cardiovascular;  Laterality: Left;  ARM FISTULA  . PERIPHERAL VASCULAR BALLOON ANGIOPLASTY Left 05/07/2017   Procedure: PERIPHERAL VASCULAR BALLOON ANGIOPLASTY;  Surgeon: Waynetta Sandy, MD;  Location: [place] CV LAB;  Service: Cardiovascular;  Laterality: Left;       Home Medications    Prior to Admission medications   Medication Sig Start Date End Date Taking? Authorizing Provider  ACCU-CHEK SOFTCLIX LANCETS lancets Use as instructed 3 times daily before meals and at bedtime. E11.9 Patient not taking: Reported on 07/09/2017 06/16/17   Charlott Rakes, MD  amLODipine (NORVASC) 2.5 MG tablet Take 1 tablet (2.5 mg total) by mouth daily. 06/16/17   Charlott Rakes, MD  aspirin 81 MG chewable tablet Chew 1 tablet (81 mg total) by mouth daily. Patient taking differently: Chew 81 mg by mouth every evening.  09/14/15   Angiulli, Lavon Paganini, PA-C  atorvastatin (LIPITOR) 40 MG tablet Take 1 tablet (40 mg total) by mouth daily at 6 PM. 06/16/17   Charlott Rakes, MD  Blood Glucose Monitoring Suppl (ACCU-CHEK AVIVA) device Use as instructed 3 times daily before meals and at bedtime. E11.9 Patient not taking: Reported on 07/09/2017 05/07/17   Charlott Rakes, MD  Blood Glucose Monitoring Suppl (TRUE METRIX METER) DEVI 1 each by Does not apply route 3 (three) times daily. Patient not taking: Reported on 07/09/2017 05/05/17   Charlott Rakes, MD  calcitRIOL (ROCALTROL) 0.25 MCG capsule Take 1 capsule (0.25 mcg total) by mouth daily. 07/15/17   Charlott Rakes, MD  carvedilol (COREG) 6.25 MG tablet Take 2 tablets (12.5 mg total) by mouth 2 (two) times daily with a meal. 06/16/17   Charlott Rakes, MD  Darbepoetin Alfa (ARANESP) 150 MCG/0.3ML SOSY injection Inject 0.3 mLs (150 mcg total) into the vein every Saturday with hemodialysis. 11/16/16   Jani Gravel, MD  furosemide (LASIX) 80 MG tablet Take 1 tablet (80 mg total) by mouth 2 (two) times daily. 06/16/17   Charlott Rakes, MD  gabapentin (NEURONTIN) 300 MG capsule Take 1 capsule (300 mg total) by mouth daily. 06/16/17   Charlott Rakes, MD  glucose blood (ACCU-CHEK AVIVA PLUS) test strip Use 3 times daily before meals and at bedtime. E11.9 Patient not taking: Reported  on 07/09/2017 06/16/17   Charlott Rakes, MD  hydrocortisone (ANUSOL-HC) 2.5 % rectal cream Place rectally 2 (two) times daily. Patient not taking: Reported on 07/09/2017 11/16/16   Jani Gravel, MD  insulin aspart (NOVOLOG) 100 UNIT/ML injection 0-12 units 3 times daily before meals as well as per sliding scale Patient taking differently: Inject 0-12 Units into the skin 3 (three) times daily with meals. Per sliding scale 03/14/17   Charlott Rakes, MD  insulin glargine (LANTUS) 100 UNIT/ML injection Inject subcutaneously 10 units of Lantus in the morning and 15 units of Lantus in the evening Patient taking differently: Inject 15 Units into the skin at bedtime.  06/16/17   Charlott Rakes, MD  Lancet Devices [facility]) lancets Use as instructed 3 times daily before meals and at bedtime. E11.9 Patient not taking: Reported on 07/09/2017 05/07/17   Charlott Rakes, MD  Nutritional Supplements (FEEDING SUPPLEMENT, NEPRO CARB STEADY,) LIQD  Take 237 mLs by mouth 2 (two) times daily between meals. Patient not taking: Reported on 07/09/2017 11/16/16   Jani Gravel, MD  ondansetron (ZOFRAN) 4 MG tablet Take 1 tablet (4 mg total) by mouth every 8 (eight) hours as needed for nausea or vomiting. Patient not taking: Reported on 07/09/2017 03/14/17   Charlott Rakes, MD  oxyCODONE (OXY IR/ROXICODONE) 5 MG immediate release tablet Take 1 tablet (5 mg total) by mouth every 6 (six) hours as needed (for pain score of 1-4). 07/18/17   Ulyses Amor, PA-C  polyethylene glycol (MIRALAX / GLYCOLAX) packet Take 17 g by mouth daily. Patient not taking: Reported on 07/09/2017 11/16/16   Jani Gravel, MD  RENAGEL 800 MG tablet Take 800-1,600 mg by mouth See admin instructions. Take 1600 mg by mouth 3 times daily with meals and take 800 mg by mouth twice daily with snacks 05/23/17   [provider]    Family History Family History  Problem Relation Age of Onset  . Diabetes Mother     Social History Social History   Tobacco Use  . Smoking status: Former Smoker    Packs/day: 0.00    Years: 4.00    Pack years: 0.00    Last attempt to quit: 02/23/1983    Years since quitting: 34.4  . Smokeless tobacco: Never Used  Substance Use Topics  . Alcohol use: No  . Drug use: No     Allergies   Patient has no known allergies.   Review of Systems Review of Systems  Constitutional: Negative for chills and fever.  Respiratory: Negative.  Negative for shortness of breath.   Cardiovascular: Negative.  Negative for chest pain.  Gastrointestinal: Negative.  Negative for nausea and vomiting.  Musculoskeletal:       See HPI.  Skin: Negative.  Negative for color change.  Neurological: Negative.      Physical Exam Updated Vital Signs BP (!) 149/64 (BP Location: Right Arm)   Pulse 62   Temp 98.2 F (36.8 C) (Oral)   Resp 16   Ht 5\' 11"  (1.803 m)   Wt 70 kg (154 lb 5.2 oz)   SpO2 98%   BMI 21.52 kg/m   Physical Exam  Constitutional: He is  oriented to person, place, and time. He appears well-developed and well-nourished.  Neck: Normal range of motion.  Cardiovascular: Intact distal pulses.  Pulmonary/Chest: Effort normal.  Musculoskeletal: Normal range of motion.  Left UE significantly swollen with bandage in place to medial arm. No redness or warmth. Swelling extends distally to wrist. Palpable thrill  over graft. Distal pulses are present.   Neurological: He is alert and oriented to person, place, and time.  Skin: Skin is warm and dry.  Psychiatric: He has a normal mood and affect.     ED Treatments / Results  Labs (all labs ordered are listed, but only abnormal results are displayed) Labs Reviewed  CBC WITH DIFFERENTIAL/PLATELET - Abnormal; Notable for the following components:      Result Value   RBC 3.35 (*)    Hemoglobin 9.8 (*)    HCT 29.8 (*)    Platelets 90 (*)    All other components within normal limits  COMPREHENSIVE METABOLIC PANEL - Abnormal; Notable for the following components:   Sodium 131 (*)    Chloride 90 (*)    Glucose, Bld 235 (*)    BUN 56 (*)    Creatinine, Ser 6.37 (*)    GFR calc non Af Amer 9 (*)    GFR calc Af Amer 10 (*)    All other components within normal limits    EKG  EKG Interpretation None       Radiology No results found.  Procedures Procedures (including critical care time)  Medications Ordered in ED Medications - No data to display   Initial Impression / Assessment and Plan / ED Course  I have reviewed the triage vital signs and the nursing notes.  Pertinent labs & imaging results that were available during my care of the patient were reviewed by me and considered in my medical decision making (see chart for details).     Patient with placement of dialysis graft replacing previous fistula on 07/18/17 presents with increased pain and swelling to the arm causing concern. No fever.   There is minimal redness to the arm and graft site. There is palpable thrill.  No leukocytosis and no fever. Doubt post-operative infection.  Discussed the patient's presentation with Dr. Donnetta Hutching (vascular) who advises that the swelling can be normal finding after graft placement. As long as there is no concern for infection, patient's extremity can be re-evaluated at dialysis. Office follow up is available if symptoms worsen or for patient concern. No imaging felt necessary tonight.   Pain addressed with Percocet. Will reassess.  Pain is improved. He is considered appropriate for discharge home. All questions answered. They plan to follow up in the office for recheck of symptoms with his vascular surgeon.     Final Clinical Impressions(s) / ED Diagnoses   Final diagnoses:  None   1. Post-operative pain  ED Discharge Orders    None       Charlann Lange, PA-C 07/22/17 0303    Merryl Hacker, MD 07/23/17 [phone]

## 2017-07-22 NOTE — Discharge Instructions (Signed)
Take Percocet for pain as directed. Continue your other medications. Follow up for dialysis as scheduled and they will monitor symptoms of swelling and pain in the left arm. Follow up with your vascular surgeon as needed in the office for recheck.

## 2017-07-23 NOTE — Anesthesia Postprocedure Evaluation (Signed)
Anesthesia Post Note  Patient: Russell Price  Procedure(s) Performed: INSERTION OF ARTERIOVENOUS (AV) GORE-TEX GRAFT ARM LEFT UPPER ARM (Left Arm Upper) LIGATION OF ARTERIOVENOUS  FISTULA (Left Arm Upper)     Patient location during evaluation: PACU Anesthesia Type: General Level of consciousness: awake and alert Pain management: pain level controlled Vital Signs Assessment: post-procedure vital signs reviewed and stable Respiratory status: spontaneous breathing, nonlabored ventilation, respiratory function stable and patient connected to nasal cannula oxygen Cardiovascular status: blood pressure returned to baseline and stable Postop Assessment: no apparent nausea or vomiting Anesthetic complications: no    Last Vitals:  Vitals:   07/18/17 1201 07/18/17 1240  BP: (!) 139/52 111/65  Pulse: 91 62  Resp: (!) 21 18  Temp: 36.7 C 36.5 C  SpO2: 100% 100%    Last Pain:  Vitals:   07/18/17 1240  TempSrc:   PainSc: 0-No pain                 Vardaan Depascale S

## 2017-07-24 DIAGNOSIS — D649 Anemia, unspecified: Secondary | ICD-10-CM | POA: Diagnosis not present

## 2017-07-24 DIAGNOSIS — E1129 Type 2 diabetes mellitus with other diabetic kidney complication: Secondary | ICD-10-CM | POA: Diagnosis not present

## 2017-07-24 DIAGNOSIS — N186 End stage renal disease: Secondary | ICD-10-CM | POA: Diagnosis not present

## 2017-07-24 DIAGNOSIS — N2581 Secondary hyperparathyroidism of renal origin: Secondary | ICD-10-CM | POA: Diagnosis not present

## 2017-07-26 DIAGNOSIS — N186 End stage renal disease: Secondary | ICD-10-CM | POA: Diagnosis not present

## 2017-07-26 DIAGNOSIS — E1129 Type 2 diabetes mellitus with other diabetic kidney complication: Secondary | ICD-10-CM | POA: Diagnosis not present

## 2017-07-26 DIAGNOSIS — N2581 Secondary hyperparathyroidism of renal origin: Secondary | ICD-10-CM | POA: Diagnosis not present

## 2017-07-26 DIAGNOSIS — D649 Anemia, unspecified: Secondary | ICD-10-CM | POA: Diagnosis not present

## 2017-07-29 DIAGNOSIS — E1129 Type 2 diabetes mellitus with other diabetic kidney complication: Secondary | ICD-10-CM | POA: Diagnosis not present

## 2017-07-29 DIAGNOSIS — D649 Anemia, unspecified: Secondary | ICD-10-CM | POA: Diagnosis not present

## 2017-07-29 DIAGNOSIS — N186 End stage renal disease: Secondary | ICD-10-CM | POA: Diagnosis not present

## 2017-07-29 DIAGNOSIS — N2581 Secondary hyperparathyroidism of renal origin: Secondary | ICD-10-CM | POA: Diagnosis not present

## 2017-07-31 DIAGNOSIS — D649 Anemia, unspecified: Secondary | ICD-10-CM | POA: Diagnosis not present

## 2017-07-31 DIAGNOSIS — N2581 Secondary hyperparathyroidism of renal origin: Secondary | ICD-10-CM | POA: Diagnosis not present

## 2017-07-31 DIAGNOSIS — N186 End stage renal disease: Secondary | ICD-10-CM | POA: Diagnosis not present

## 2017-07-31 DIAGNOSIS — E1129 Type 2 diabetes mellitus with other diabetic kidney complication: Secondary | ICD-10-CM | POA: Diagnosis not present

## 2017-08-02 DIAGNOSIS — N2581 Secondary hyperparathyroidism of renal origin: Secondary | ICD-10-CM | POA: Diagnosis not present

## 2017-08-02 DIAGNOSIS — D649 Anemia, unspecified: Secondary | ICD-10-CM | POA: Diagnosis not present

## 2017-08-02 DIAGNOSIS — N186 End stage renal disease: Secondary | ICD-10-CM | POA: Diagnosis not present

## 2017-08-02 DIAGNOSIS — E1129 Type 2 diabetes mellitus with other diabetic kidney complication: Secondary | ICD-10-CM | POA: Diagnosis not present

## 2017-08-04 MED FILL — ATORVASTATIN 40 MG TABLET: 40 | 30 days supply | Qty: 30 | Fill #4

## 2017-08-05 DIAGNOSIS — E1129 Type 2 diabetes mellitus with other diabetic kidney complication: Secondary | ICD-10-CM | POA: Diagnosis not present

## 2017-08-05 DIAGNOSIS — N2581 Secondary hyperparathyroidism of renal origin: Secondary | ICD-10-CM | POA: Diagnosis not present

## 2017-08-05 DIAGNOSIS — D649 Anemia, unspecified: Secondary | ICD-10-CM | POA: Diagnosis not present

## 2017-08-05 DIAGNOSIS — N186 End stage renal disease: Secondary | ICD-10-CM | POA: Diagnosis not present

## 2017-08-07 DIAGNOSIS — N186 End stage renal disease: Secondary | ICD-10-CM | POA: Diagnosis not present

## 2017-08-07 DIAGNOSIS — D649 Anemia, unspecified: Secondary | ICD-10-CM | POA: Diagnosis not present

## 2017-08-07 DIAGNOSIS — N2581 Secondary hyperparathyroidism of renal origin: Secondary | ICD-10-CM | POA: Diagnosis not present

## 2017-08-07 DIAGNOSIS — E1129 Type 2 diabetes mellitus with other diabetic kidney complication: Secondary | ICD-10-CM | POA: Diagnosis not present

## 2017-08-09 DIAGNOSIS — N186 End stage renal disease: Secondary | ICD-10-CM | POA: Diagnosis not present

## 2017-08-09 DIAGNOSIS — N2581 Secondary hyperparathyroidism of renal origin: Secondary | ICD-10-CM | POA: Diagnosis not present

## 2017-08-09 DIAGNOSIS — D649 Anemia, unspecified: Secondary | ICD-10-CM | POA: Diagnosis not present

## 2017-08-09 DIAGNOSIS — E1129 Type 2 diabetes mellitus with other diabetic kidney complication: Secondary | ICD-10-CM | POA: Diagnosis not present

## 2017-08-11 ENCOUNTER — Ambulatory Visit (INDEPENDENT_AMBULATORY_CARE_PROVIDER_SITE_OTHER): Payer: Medicare Other | Admitting: Surgery

## 2017-08-11 ENCOUNTER — Encounter: Payer: Self-pay | Admitting: Surgery

## 2017-08-11 VITALS — BP 162/63 | HR 54 | Temp 98.5°F | Resp 16 | Ht 71.0 in | Wt 154.0 lb

## 2017-08-11 DIAGNOSIS — N186 End stage renal disease: Secondary | ICD-10-CM

## 2017-08-11 DIAGNOSIS — Z992 Dependence on renal dialysis: Secondary | ICD-10-CM

## 2017-08-11 MED FILL — AMLODIPINE BESYLATE 2.5 MG: 2.5 | 30 days supply | Qty: 30 | Fill #2

## 2017-08-11 MED FILL — GABAPENTIN 300 MG CAPSULE: 300 | 30 days supply | Qty: 30 | Fill #1

## 2017-08-11 NOTE — Progress Notes (Signed)
POST OPERATIVE OFFICE NOTE    CC:  F/u for surgery  HPI:  This is a 57 y.o. male who is s/p Placement of LUA AVG and ligation of left arm AV fistula by Dr. Bridgett Larsson on 07/18/17.  He presented to the ED on 07/22/17 with c/o swelling and worsening pain.  ED provider spoke with Dr. Donnetta Hutching and was told this is common with grafts as long as there is no evidence of infection.  He was given Percocet and got some relief with the pain.  He presents today for follow up.  He states this has much improved.  He denies any pain or numbness in his left hand.  He is currently dialyzing via left IJ TDC.  He dialyzes T/T/S on [place].     No Known Allergies  Current Outpatient Medications  Medication Sig Dispense Refill  . ACCU-CHEK SOFTCLIX LANCETS lancets Use as instructed 3 times daily before meals and at bedtime. E11.9 (Patient not taking: Reported on 07/09/2017) 100 each 12  . amLODipine (NORVASC) 2.5 MG tablet Take 1 tablet (2.5 mg total) by mouth daily. 30 tablet 5  . aspirin 81 MG chewable tablet Chew 1 tablet (81 mg total) by mouth daily. (Patient taking differently: Chew 81 mg by mouth every evening. )    . atorvastatin (LIPITOR) 40 MG tablet Take 1 tablet (40 mg total) by mouth daily at 6 PM. 30 tablet 5  . Blood Glucose Monitoring Suppl (ACCU-CHEK AVIVA) device Use as instructed 3 times daily before meals and at bedtime. E11.9 (Patient not taking: Reported on 07/09/2017) 1 each 0  . Blood Glucose Monitoring Suppl (TRUE METRIX METER) DEVI 1 each by Does not apply route 3 (three) times daily. (Patient not taking: Reported on 07/09/2017) 1 Device 0  . calcitRIOL (ROCALTROL) 0.25 MCG capsule Take 1 capsule (0.25 mcg total) by mouth daily. 30 capsule 6  . carvedilol (COREG) 6.25 MG tablet Take 2 tablets (12.5 mg total) by mouth 2 (two) times daily with a meal. 60 tablet 5  . Darbepoetin Alfa (ARANESP) 150 MCG/0.3ML SOSY injection Inject 0.3 mLs (150 mcg total) into the vein every Saturday with hemodialysis.  1.68 mL 0  . furosemide (LASIX) 80 MG tablet Take 1 tablet (80 mg total) by mouth 2 (two) times daily. 60 tablet 3  . gabapentin (NEURONTIN) 300 MG capsule Take 1 capsule (300 mg total) by mouth daily. 30 capsule 5  . glucose blood (ACCU-CHEK AVIVA PLUS) test strip Use 3 times daily before meals and at bedtime. E11.9 (Patient not taking: Reported on 07/09/2017) 100 each 12  . hydrocortisone (ANUSOL-HC) 2.5 % rectal cream Place rectally 2 (two) times daily. (Patient not taking: Reported on 07/09/2017) 30 g 0  . insulin aspart (NOVOLOG) 100 UNIT/ML injection 0-12 units 3 times daily before meals as well as per sliding scale (Patient taking differently: Inject 0-12 Units into the skin 3 (three) times daily with meals. Per sliding scale) 10 mL 5  . insulin glargine (LANTUS) 100 UNIT/ML injection Inject subcutaneously 10 units of Lantus in the morning and 15 units of Lantus in the evening (Patient taking differently: Inject 15 Units into the skin at bedtime. ) 30 mL 5  . Lancet Devices (ACCU-CHEK SOFTCLIX) lancets Use as instructed 3 times daily before meals and at bedtime. E11.9 (Patient not taking: Reported on 07/09/2017) 1 each 12  . Nutritional Supplements (FEEDING SUPPLEMENT, NEPRO CARB STEADY,) LIQD Take 237 mLs by mouth 2 (two) times daily between meals. (Patient not  taking: Reported on 07/09/2017) 60 Can 0  . ondansetron (ZOFRAN) 4 MG tablet Take 1 tablet (4 mg total) by mouth every 8 (eight) hours as needed for nausea or vomiting. (Patient not taking: Reported on 07/09/2017) 30 tablet 1  . oxyCODONE (OXY IR/ROXICODONE) 5 MG immediate release tablet Take 1 tablet (5 mg total) by mouth every 6 (six) hours as needed. 10 tablet 0  . polyethylene glycol (MIRALAX / GLYCOLAX) packet Take 17 g by mouth daily. (Patient not taking: Reported on 07/09/2017) 14 each 0  . RENAGEL 800 MG tablet Take 800-1,600 mg by mouth See admin instructions. Take 1600 mg by mouth 3 times daily with meals and take 800 mg by mouth twice  daily with snacks     No current facility-administered medications for this visit.      ROS:  See HPI  Physical Exam:  Vitals:   08/11/17 1307  BP: (!) 162/63  Pulse: (!) 54  Resp: 16  Temp: 98.5 F (36.9 C)  SpO2: 100%   Vitals:   08/11/17 1307  Weight: 154 lb (69.9 kg)  Height: 5\' 11"  (1.803 m)     Incision:  Well healed; superficial separation of proximal incision ~ 72mm-no drainage or evidence of infection Extremities:  Motor and sensation are in tact left hand.  +palpable thrill and audible bruit within the graft.    Assessment/Plan:  This is a 57 y.o. male who is s/p: Placement of LUA AVG and ligation of left arm AV fistula by Dr. Bridgett Larsson on 07/18/17  -pt graft with +thrill/bruit.  His incisions have healed.  There is a superficial separation of the proximal incision ~10mm-there is no evidence of infection or drainage and this should continue to heal.  -pt's graft will be ready to used 08/18/17.  Once the graft has been used successfully for 2-3 times, the tunneled catheter may be removed.  He is on aspirin only.    Leontine Locket, PA-C [facility] [phone]  Clinic MD:  Bridgett Larsson

## 2017-08-12 DIAGNOSIS — N186 End stage renal disease: Secondary | ICD-10-CM | POA: Diagnosis not present

## 2017-08-12 DIAGNOSIS — N2581 Secondary hyperparathyroidism of renal origin: Secondary | ICD-10-CM | POA: Diagnosis not present

## 2017-08-12 DIAGNOSIS — E1129 Type 2 diabetes mellitus with other diabetic kidney complication: Secondary | ICD-10-CM | POA: Diagnosis not present

## 2017-08-12 DIAGNOSIS — D649 Anemia, unspecified: Secondary | ICD-10-CM | POA: Diagnosis not present

## 2017-08-14 DIAGNOSIS — N2581 Secondary hyperparathyroidism of renal origin: Secondary | ICD-10-CM | POA: Diagnosis not present

## 2017-08-14 DIAGNOSIS — E1129 Type 2 diabetes mellitus with other diabetic kidney complication: Secondary | ICD-10-CM | POA: Diagnosis not present

## 2017-08-14 DIAGNOSIS — N186 End stage renal disease: Secondary | ICD-10-CM | POA: Diagnosis not present

## 2017-08-14 DIAGNOSIS — D649 Anemia, unspecified: Secondary | ICD-10-CM | POA: Diagnosis not present

## 2017-08-16 DIAGNOSIS — E1129 Type 2 diabetes mellitus with other diabetic kidney complication: Secondary | ICD-10-CM | POA: Diagnosis not present

## 2017-08-16 DIAGNOSIS — N186 End stage renal disease: Secondary | ICD-10-CM | POA: Diagnosis not present

## 2017-08-16 DIAGNOSIS — N2581 Secondary hyperparathyroidism of renal origin: Secondary | ICD-10-CM | POA: Diagnosis not present

## 2017-08-16 DIAGNOSIS — D649 Anemia, unspecified: Secondary | ICD-10-CM | POA: Diagnosis not present

## 2017-08-18 DIAGNOSIS — E1122 Type 2 diabetes mellitus with diabetic chronic kidney disease: Secondary | ICD-10-CM | POA: Diagnosis not present

## 2017-08-18 DIAGNOSIS — N186 End stage renal disease: Secondary | ICD-10-CM | POA: Diagnosis not present

## 2017-08-18 DIAGNOSIS — Z992 Dependence on renal dialysis: Secondary | ICD-10-CM | POA: Diagnosis not present

## 2017-08-19 DIAGNOSIS — N186 End stage renal disease: Secondary | ICD-10-CM | POA: Diagnosis not present

## 2017-08-19 DIAGNOSIS — N2581 Secondary hyperparathyroidism of renal origin: Secondary | ICD-10-CM | POA: Diagnosis not present

## 2017-08-19 DIAGNOSIS — D649 Anemia, unspecified: Secondary | ICD-10-CM | POA: Diagnosis not present

## 2017-08-21 DIAGNOSIS — D649 Anemia, unspecified: Secondary | ICD-10-CM | POA: Diagnosis not present

## 2017-08-21 DIAGNOSIS — N186 End stage renal disease: Secondary | ICD-10-CM | POA: Diagnosis not present

## 2017-08-21 DIAGNOSIS — N2581 Secondary hyperparathyroidism of renal origin: Secondary | ICD-10-CM | POA: Diagnosis not present

## 2017-08-23 DIAGNOSIS — D649 Anemia, unspecified: Secondary | ICD-10-CM | POA: Diagnosis not present

## 2017-08-23 DIAGNOSIS — N186 End stage renal disease: Secondary | ICD-10-CM | POA: Diagnosis not present

## 2017-08-23 DIAGNOSIS — N2581 Secondary hyperparathyroidism of renal origin: Secondary | ICD-10-CM | POA: Diagnosis not present

## 2017-08-25 MED FILL — NovoLOG 100 UNIT/ML SOLN: 100 | 27 days supply | Qty: 10 | Fill #2

## 2017-08-25 MED FILL — LANTUS 100 UNITS/ML VIAL: 100 | 28 days supply | Qty: 10 | Fill #1

## 2017-08-26 DIAGNOSIS — N186 End stage renal disease: Secondary | ICD-10-CM | POA: Diagnosis not present

## 2017-08-26 DIAGNOSIS — N2581 Secondary hyperparathyroidism of renal origin: Secondary | ICD-10-CM | POA: Diagnosis not present

## 2017-08-26 DIAGNOSIS — D649 Anemia, unspecified: Secondary | ICD-10-CM | POA: Diagnosis not present

## 2017-08-28 ENCOUNTER — Other Ambulatory Visit: Payer: Self-pay | Admitting: Nurse Practitioner

## 2017-08-28 DIAGNOSIS — N63 Unspecified lump in unspecified breast: Secondary | ICD-10-CM

## 2017-08-28 DIAGNOSIS — N186 End stage renal disease: Secondary | ICD-10-CM | POA: Diagnosis not present

## 2017-08-28 DIAGNOSIS — N2581 Secondary hyperparathyroidism of renal origin: Secondary | ICD-10-CM | POA: Diagnosis not present

## 2017-08-28 DIAGNOSIS — D649 Anemia, unspecified: Secondary | ICD-10-CM | POA: Diagnosis not present

## 2017-08-30 DIAGNOSIS — D649 Anemia, unspecified: Secondary | ICD-10-CM | POA: Diagnosis not present

## 2017-08-30 DIAGNOSIS — N2581 Secondary hyperparathyroidism of renal origin: Secondary | ICD-10-CM | POA: Diagnosis not present

## 2017-08-30 DIAGNOSIS — N186 End stage renal disease: Secondary | ICD-10-CM | POA: Diagnosis not present

## 2017-09-01 ENCOUNTER — Encounter: Payer: Self-pay | Admitting: Pharmacist

## 2017-09-01 DIAGNOSIS — E113512 Type 2 diabetes mellitus with proliferative diabetic retinopathy with macular edema, left eye: Secondary | ICD-10-CM | POA: Diagnosis not present

## 2017-09-01 DIAGNOSIS — H40052 Ocular hypertension, left eye: Secondary | ICD-10-CM | POA: Diagnosis not present

## 2017-09-01 MED FILL — CARVEDILOL 6.25 MG TABLET: 6.25 | 15 days supply | Qty: 60 | Fill #2

## 2017-09-01 MED FILL — ATORVASTATIN CALCIUM 40 MG: 40 | 30 days supply | Qty: 30 | Fill #5

## 2017-09-01 MED FILL — FUROSEMIDE 80 MG TAB: 80 | 30 days supply | Qty: 60 | Fill #1

## 2017-09-01 NOTE — Progress Notes (Signed)
Calcitriol request was denied. Sent in appeal.

## 2017-09-02 DIAGNOSIS — D649 Anemia, unspecified: Secondary | ICD-10-CM | POA: Diagnosis not present

## 2017-09-02 DIAGNOSIS — N186 End stage renal disease: Secondary | ICD-10-CM | POA: Diagnosis not present

## 2017-09-02 DIAGNOSIS — N2581 Secondary hyperparathyroidism of renal origin: Secondary | ICD-10-CM | POA: Diagnosis not present

## 2017-09-03 ENCOUNTER — Telehealth: Payer: Self-pay | Admitting: Family Medicine

## 2017-09-03 NOTE — Telephone Encounter (Signed)
[employer] call to get more information for the appeal for the med. Please call them back [phone] Ref # [id number], please follow up

## 2017-09-03 NOTE — Telephone Encounter (Signed)
Calcitriol covered by part B, that is why [employer] is rejecting it. Will fax pharmacy to run until Medicare Part B (red, white, and blue card) and it should be covered then.

## 2017-09-04 DIAGNOSIS — N186 End stage renal disease: Secondary | ICD-10-CM | POA: Diagnosis not present

## 2017-09-04 DIAGNOSIS — N2581 Secondary hyperparathyroidism of renal origin: Secondary | ICD-10-CM | POA: Diagnosis not present

## 2017-09-04 DIAGNOSIS — D649 Anemia, unspecified: Secondary | ICD-10-CM | POA: Diagnosis not present

## 2017-09-06 DIAGNOSIS — D649 Anemia, unspecified: Secondary | ICD-10-CM | POA: Diagnosis not present

## 2017-09-06 DIAGNOSIS — N186 End stage renal disease: Secondary | ICD-10-CM | POA: Diagnosis not present

## 2017-09-06 DIAGNOSIS — N2581 Secondary hyperparathyroidism of renal origin: Secondary | ICD-10-CM | POA: Diagnosis not present

## 2017-09-08 ENCOUNTER — Ambulatory Visit
Admission: RE | Admit: 2017-09-08 | Discharge: 2017-09-08 | Disposition: A | Discharge status: Home / Self Care | Payer: Medicare Other | Source: Ambulatory Visit | Attending: Nurse Practitioner | Admitting: Nurse Practitioner

## 2017-09-08 ENCOUNTER — Ambulatory Visit: Admission: RE | Admit: 2017-09-08 | Payer: Medicare Other | Source: Ambulatory Visit

## 2017-09-08 DIAGNOSIS — R922 Inconclusive mammogram: Secondary | ICD-10-CM | POA: Diagnosis not present

## 2017-09-08 DIAGNOSIS — N63 Unspecified lump in unspecified breast: Secondary | ICD-10-CM

## 2017-09-08 MED FILL — AMLODIPINE BESYLATE 2.5 MG: 2.5 | 30 days supply | Qty: 30 | Fill #3

## 2017-09-08 MED FILL — GABAPENTIN 300 MG CAPSULE: 300 | 30 days supply | Qty: 30 | Fill #2

## 2017-09-09 DIAGNOSIS — N186 End stage renal disease: Secondary | ICD-10-CM | POA: Diagnosis not present

## 2017-09-09 DIAGNOSIS — D649 Anemia, unspecified: Secondary | ICD-10-CM | POA: Diagnosis not present

## 2017-09-09 DIAGNOSIS — N2581 Secondary hyperparathyroidism of renal origin: Secondary | ICD-10-CM | POA: Diagnosis not present

## 2017-09-11 DIAGNOSIS — E1129 Type 2 diabetes mellitus with other diabetic kidney complication: Secondary | ICD-10-CM | POA: Diagnosis not present

## 2017-09-11 DIAGNOSIS — D649 Anemia, unspecified: Secondary | ICD-10-CM | POA: Diagnosis not present

## 2017-09-11 DIAGNOSIS — N2581 Secondary hyperparathyroidism of renal origin: Secondary | ICD-10-CM | POA: Diagnosis not present

## 2017-09-11 DIAGNOSIS — N186 End stage renal disease: Secondary | ICD-10-CM | POA: Diagnosis not present

## 2017-09-13 DIAGNOSIS — D649 Anemia, unspecified: Secondary | ICD-10-CM | POA: Diagnosis not present

## 2017-09-13 DIAGNOSIS — N2581 Secondary hyperparathyroidism of renal origin: Secondary | ICD-10-CM | POA: Diagnosis not present

## 2017-09-13 DIAGNOSIS — N186 End stage renal disease: Secondary | ICD-10-CM | POA: Diagnosis not present

## 2017-09-15 ENCOUNTER — Ambulatory Visit: Payer: Medicare Other | Attending: Family Medicine | Admitting: Family Medicine

## 2017-09-15 ENCOUNTER — Encounter: Payer: Self-pay | Admitting: Family Medicine

## 2017-09-15 VITALS — BP 168/53 | HR 55 | Ht 71.0 in | Wt 158.0 lb

## 2017-09-15 DIAGNOSIS — Z9889 Other specified postprocedural states: Secondary | ICD-10-CM | POA: Insufficient documentation

## 2017-09-15 DIAGNOSIS — Z79899 Other long term (current) drug therapy: Secondary | ICD-10-CM | POA: Diagnosis not present

## 2017-09-15 DIAGNOSIS — E785 Hyperlipidemia, unspecified: Secondary | ICD-10-CM | POA: Diagnosis not present

## 2017-09-15 DIAGNOSIS — I10 Essential (primary) hypertension: Secondary | ICD-10-CM

## 2017-09-15 DIAGNOSIS — N186 End stage renal disease: Secondary | ICD-10-CM | POA: Insufficient documentation

## 2017-09-15 DIAGNOSIS — I120 Hypertensive chronic kidney disease with stage 5 chronic kidney disease or end stage renal disease: Secondary | ICD-10-CM | POA: Insufficient documentation

## 2017-09-15 DIAGNOSIS — H5440 Blindness, one eye, unspecified eye: Secondary | ICD-10-CM

## 2017-09-15 DIAGNOSIS — N62 Hypertrophy of breast: Secondary | ICD-10-CM | POA: Insufficient documentation

## 2017-09-15 DIAGNOSIS — Z992 Dependence on renal dialysis: Secondary | ICD-10-CM | POA: Insufficient documentation

## 2017-09-15 DIAGNOSIS — Z79891 Long term (current) use of opiate analgesic: Secondary | ICD-10-CM | POA: Diagnosis not present

## 2017-09-15 DIAGNOSIS — N63 Unspecified lump in unspecified breast: Secondary | ICD-10-CM | POA: Diagnosis not present

## 2017-09-15 DIAGNOSIS — H409 Unspecified glaucoma: Secondary | ICD-10-CM | POA: Diagnosis not present

## 2017-09-15 DIAGNOSIS — I2510 Atherosclerotic heart disease of native coronary artery without angina pectoris: Secondary | ICD-10-CM | POA: Diagnosis not present

## 2017-09-15 DIAGNOSIS — Z7982 Long term (current) use of aspirin: Secondary | ICD-10-CM | POA: Insufficient documentation

## 2017-09-15 DIAGNOSIS — E1122 Type 2 diabetes mellitus with diabetic chronic kidney disease: Secondary | ICD-10-CM | POA: Diagnosis not present

## 2017-09-15 DIAGNOSIS — Z8673 Personal history of transient ischemic attack (TIA), and cerebral infarction without residual deficits: Secondary | ICD-10-CM | POA: Diagnosis not present

## 2017-09-15 DIAGNOSIS — E1140 Type 2 diabetes mellitus with diabetic neuropathy, unspecified: Secondary | ICD-10-CM | POA: Insufficient documentation

## 2017-09-15 DIAGNOSIS — E119 Type 2 diabetes mellitus without complications: Secondary | ICD-10-CM | POA: Diagnosis not present

## 2017-09-15 DIAGNOSIS — H5461 Unqualified visual loss, right eye, normal vision left eye: Secondary | ICD-10-CM | POA: Insufficient documentation

## 2017-09-15 DIAGNOSIS — Z794 Long term (current) use of insulin: Secondary | ICD-10-CM | POA: Insufficient documentation

## 2017-09-15 LAB — GLUCOSE, POCT (MANUAL RESULT ENTRY): POC Glucose: 311 mg/dl — AB (ref 70–99)

## 2017-09-15 LAB — POCT GLYCOSYLATED HEMOGLOBIN (HGB A1C): Hemoglobin A1C: 8.1

## 2017-09-15 NOTE — Patient Instructions (Signed)

## 2017-09-15 NOTE — Progress Notes (Signed)
Subjective:  Patient ID: Russell Price, male    DOB: 1960/12/18  Age: 57 y.o. MRN: [id number]  CC: breast mass   HPI Russell Price is a 57 year old male with a history of hypertension, type 2 diabetes mellitus (A1c 8.1), diabetic neuropathy, blind in the right eye, end-stage renal disease (currently on hemodialysis on Tuesdays, Thursdays and Saturdays) who comes in today for a follow-up visit.  He is concerned about bilateral breast masses which he has had for the last 1 month but denies tenderness.  Mammogram from 09/08/2017 was negative for malignancy. With regards to his diabetes mellitus he endorses compliance with his medications and his A1c is 8.1 which has improved from 9.1 previously.  He denies hypoglycemia or numbness in extremities and is up-to-date on his annual eye exam (followed by [facility]). His blood pressure is elevated today despite taking his antihypertensive. He tolerates his statin with no complaints of myalgias or other adverse effects. With regards to his healthcare maintenance he recently had a colonoscopy which was negative.   Past Medical History:  Diagnosis Date  . CAD (coronary artery disease)    a. underwent cath in 02/2016 after an abnormal nuc which showed 2V CAD in RCA and OM3 with no good targets for PCI. Medical managment recommended.   . Chronic kidney disease   . Diabetes mellitus without complication ([place])    type II - followed by Dr Chalmers Cater   . Difficult intubation    ER intubation 2016  . ESRD (end stage renal disease) ([place])    Tues, Th, Sat [place]  . Glaucoma   . Hemodialysis patient [facility])    has been on dialysis since approx 10/2016   . Hypertension    followed by [facility]  . Stroke [facility])    patient denies on 02/19/2017     Past Surgical History:  Procedure Laterality Date  . A/V FISTULAGRAM Left 11/27/2016   Procedure: A/V Fistulagram;  Surgeon: Waynetta Sandy, MD;  Location: [place] [facility];  Service: Cardiovascular;  Laterality: Left;  . A/V FISTULAGRAM Left 05/07/2017   Procedure: A/V FISTULAGRAM;  Surgeon: Waynetta Sandy, MD;  Location: [place] [facility];  Service: Cardiovascular;  Laterality: Left;  . A/V FISTULAGRAM Left 07/14/2017   Procedure: A/V FISTULAGRAM;  Surgeon: Waynetta Sandy, MD;  Location: [place] [facility];  Service: Cardiovascular;  Laterality: Left;  . AV FISTULA PLACEMENT Left 03/12/2016   Procedure: Left Arm ARTERIOVENOUS (AV) FISTULA CREATION;  Surgeon: Waynetta Sandy, MD;  Location: [facility];  Service: Vascular;  Laterality: Left;  . AV FISTULA PLACEMENT Left 07/18/2017   Procedure: INSERTION OF ARTERIOVENOUS (AV) GORE-TEX GRAFT ARM LEFT UPPER ARM;  Surgeon: Conrad [place], MD;  Location: [place];  Service: Vascular;  Laterality: Left;  . BASCILIC VEIN TRANSPOSITION Left 05/14/2016   Procedure: SECOND STAGE LEFT BASILIC VEIN TRANSPOSITION;  Surgeon: Waynetta Sandy, MD;  Location: [place];  Service: Vascular;  Laterality: Left;  . BRONCHOSCOPY  02/14/2015   for pulm hemorrhage  . CARDIAC CATHETERIZATION N/A 03/04/2016   Procedure: Left Heart Cath and Coronary Angiography;  Surgeon: Leonie Man, MD;  Location: [place] [facility];  Service: Cardiovascular;  Laterality: N/A;  . COLONOSCOPY WITH PROPOFOL N/A 04/07/2017   Procedure: COLONOSCOPY WITH PROPOFOL;  Surgeon: Ronnette Juniper, MD;  Location: [place];  Service: Gastroenterology;  Laterality: N/A;  . EYE SURGERY Left   . INSERTION OF DIALYSIS CATHETER Right 11/08/2016  Procedure: INSERTION OF DIALYSIS CATHETER;  Surgeon: Rosetta Posner, MD;  Location: [place];  Service: Vascular;  Laterality: Right;  . IR PARACENTESIS  11/13/2016  . LIGATION OF ARTERIOVENOUS  FISTULA Left 07/18/2017   Procedure: LIGATION OF ARTERIOVENOUS  FISTULA;  Surgeon: Conrad Houston, MD;  Location: [place];  Service: Vascular;  Laterality: Left;  . PERIPHERAL VASCULAR BALLOON ANGIOPLASTY Left  11/27/2016   Procedure: Peripheral Vascular Balloon Angioplasty;  Surgeon: Waynetta Sandy, MD;  Location: [place] [facility];  Service: Cardiovascular;  Laterality: Left;  ARM FISTULA  . PERIPHERAL VASCULAR BALLOON ANGIOPLASTY Left 05/07/2017   Procedure: PERIPHERAL VASCULAR BALLOON ANGIOPLASTY;  Surgeon: Waynetta Sandy, MD;  Location: [place] [facility];  Service: Cardiovascular;  Laterality: Left;    No Known Allergies   Outpatient Medications Prior to Visit  Medication Sig Dispense Refill  . ACCU-CHEK SOFTCLIX LANCETS lancets Use as instructed 3 times daily before meals and at bedtime. E11.9 100 each 12  . amLODipine (NORVASC) 2.5 MG tablet Take 1 tablet (2.5 mg total) by mouth daily. 30 tablet 5  . aspirin 81 MG chewable tablet Chew 1 tablet (81 mg total) by mouth daily. (Patient taking differently: Chew 81 mg by mouth every evening. )    . atorvastatin (LIPITOR) 40 MG tablet Take 1 tablet (40 mg total) by mouth daily at 6 PM. 30 tablet 5  . Blood Glucose Monitoring Suppl (ACCU-CHEK AVIVA) device Use as instructed 3 times daily before meals and at bedtime. E11.9 1 each 0  . Blood Glucose Monitoring Suppl (TRUE METRIX METER) DEVI 1 each by Does not apply route 3 (three) times daily. 1 Device 0  . calcitRIOL (ROCALTROL) 0.25 MCG capsule Take 1 capsule (0.25 mcg total) by mouth daily. 30 capsule 6  . carvedilol (COREG) 6.25 MG tablet Take 2 tablets (12.5 mg total) by mouth 2 (two) times daily with a meal. 60 tablet 5  . Darbepoetin Alfa (ARANESP) 150 MCG/0.3ML SOSY injection Inject 0.3 mLs (150 mcg total) into the vein every Saturday with hemodialysis. 1.68 mL 0  . furosemide (LASIX) 80 MG tablet Take 1 tablet (80 mg total) by mouth 2 (two) times daily. 60 tablet 3  . gabapentin (NEURONTIN) 300 MG capsule Take 1 capsule (300 mg total) by mouth daily. 30 capsule 5  . glucose blood (ACCU-CHEK AVIVA PLUS) test strip Use 3 times daily before meals and at bedtime. E11.9 100  each 12  . hydrocortisone (ANUSOL-HC) 2.5 % rectal cream Place rectally 2 (two) times daily. 30 g 0  . insulin aspart (NOVOLOG) 100 UNIT/ML injection 0-12 units 3 times daily before meals as well as per sliding scale (Patient taking differently: Inject 0-12 Units into the skin 3 (three) times daily with meals. Per sliding scale) 10 mL 5  . insulin glargine (LANTUS) 100 UNIT/ML injection Inject subcutaneously 10 units of Lantus in the morning and 15 units of Lantus in the evening (Patient taking differently: Inject 15 Units into the skin at bedtime. ) 30 mL 5  . Lancet Devices (ACCU-CHEK SOFTCLIX) lancets Use as instructed 3 times daily before meals and at bedtime. E11.9 1 each 12  . Nutritional Supplements (FEEDING SUPPLEMENT, NEPRO CARB STEADY,) LIQD Take 237 mLs by mouth 2 (two) times daily between meals. 60 Can 0  . ondansetron (ZOFRAN) 4 MG tablet Take 1 tablet (4 mg total) by mouth every 8 (eight) hours as needed for nausea or vomiting. 30 tablet 1  . oxyCODONE (OXY IR/ROXICODONE) 5 MG  immediate release tablet Take 1 tablet (5 mg total) by mouth every 6 (six) hours as needed. 10 tablet 0  . polyethylene glycol (MIRALAX / GLYCOLAX) packet Take 17 g by mouth daily. 14 each 0  . RENAGEL 800 MG tablet Take 800-1,600 mg by mouth See admin instructions. Take 1600 mg by mouth 3 times daily with meals and take 800 mg by mouth twice daily with snacks     No facility-administered medications prior to visit.     ROS Review of Systems  Constitutional: Negative for activity change and appetite change.  HENT: Negative for sinus pressure and sore throat.   Eyes: Negative for visual disturbance.  Respiratory: Negative for cough, chest tightness and shortness of breath.   Cardiovascular: Negative for chest pain and leg swelling.  Gastrointestinal: Negative for abdominal distention, abdominal pain, constipation and diarrhea.  Endocrine: Negative.   Genitourinary: Negative for dysuria.  Musculoskeletal:  Negative for joint swelling and myalgias.  Skin: Negative for rash.  Allergic/Immunologic: Negative.   Neurological: Negative for weakness, light-headedness and numbness.  Psychiatric/Behavioral: Negative for dysphoric mood and suicidal ideas.    Objective:  BP (!) 168/53   Pulse (!) 55   Wt 158 lb (71.7 kg)   SpO2 99%   BMI 22.04 kg/m   BP/Weight 09/15/2017 2/75/1700 05/26/4942  Systolic BP 967 591 638  Diastolic BP 53 63 72  Wt. (Lbs) 158 154 -  BMI 22.04 21.48 21.52      Physical Exam  Constitutional: He is oriented to person, place, and time. He appears well-developed and well-nourished.  Cardiovascular: Normal rate, normal heart sounds and intact distal pulses.  No murmur heard. Pulmonary/Chest: Effort normal and breath sounds normal. He has no wheezes. He has no rales. He exhibits no tenderness. Right breast exhibits mass (non tender subareolar mass). Right breast exhibits no tenderness. Left breast exhibits mass (non tender subareaolar mass). Left breast exhibits no tenderness.  Abdominal: Soft. Bowel sounds are normal. He exhibits no distension and no mass. There is no tenderness.  Musculoskeletal: Normal range of motion.  Neurological: He is alert and oriented to person, place, and time.  Skin: Skin is warm and dry.     EXAM: DIGITAL DIAGNOSTIC BILATERAL MAMMOGRAM WITH CAD AND TOMO  COMPARISON:  None.  ACR Breast Density Category c: The breast tissue is heterogeneously dense, which may obscure small masses.  FINDINGS: There is bilateral gynecomastia, mixed nodular and florid types. The RIGHT-sided gynecomastia corresponds to the area of clinical concern.  There are no dominant masses, suspicious calcifications or secondary signs of malignancy within either breast.  Mammographic images were processed with CAD.  IMPRESSION: 1. Bilateral gynecomastia, mixed nodular and florid types. 2. No evidence of malignancy within either  breast.  RECOMMENDATION: 1. Clinical follow-up for the bilateral gynecomastia. 2. I discussed with the patient the fact that gynecomastia can occur in older men as testosterone levels decrease with age or in younger men with low testosterone levels, causing a change in the serum testosterone:estrogen ratio. We also discussed other potential etiologies of gynecomastia including numerous prescription medications and chronic liver disease. Recreational drugs (marijuana and anabolic steroids in particular) can also cause gynecomastia. The patient was instructed to return for additional imaging if the area that he feels becomes larger and/or firmer to palpation, or if a new palpable abnormality is identified in either breast. We also discussed the possibility of surgical excision if symptoms continue and if an etiology of the gynecomastia cannot be determined and therefore corrected.  I have discussed the findings and recommendations with the patient. Results were also provided in writing at the conclusion of the visit. If applicable, a reminder letter will be sent to the patient regarding the next appointment.  BI-RADS CATEGORY  2: Benign.   Lab Results  Component Value Date   HGBA1C 8.1 09/15/2017    Assessment & Plan:   1. Type 2 diabetes mellitus without complication, without long-term current use of insulin (HCC) Uncontrolled with A1c of 8.1 which has improved from 9.1 previously No regimen change today as he is prone to hypoglycemia due to his ESRD Up-to-date on eye exam - Glucose (CBG) - POCT glycosylated hemoglobin (Hb A1C)  2. Essential hypertension Elevated BP No regimen change to prevent hypotension on dialysis tomorrow  3. Blind right eye Closely followed by [facility] with an upcoming appointment next month  4. Hyperlipidemia, unspecified hyperlipidemia type Stable Continue atorvastatin  5. Breast lump Symptoms present for the last 1 month;  no inciting medication or condition identified Mammogram negative for malignancy We have discussed available options including referral to breast surgeon however he would like to hold off of this  6. ESRD (end stage renal disease) ([place]) Continue hemodialysis as per schedule  HCM: Up-to-date on colonoscopy; repeat recommended in 10 years Unsure if he received pneumonia or tetanus vaccine and will check with his dialysis center and update the clinic.  No orders of the defined types were placed in this encounter.   Follow-up: Return in about 3 months (around 12/15/2017) for follow up of chronic medical conditions.   Charlott Rakes MD

## 2017-09-16 DIAGNOSIS — N186 End stage renal disease: Secondary | ICD-10-CM | POA: Diagnosis not present

## 2017-09-16 DIAGNOSIS — D649 Anemia, unspecified: Secondary | ICD-10-CM | POA: Diagnosis not present

## 2017-09-16 DIAGNOSIS — N2581 Secondary hyperparathyroidism of renal origin: Secondary | ICD-10-CM | POA: Diagnosis not present

## 2017-09-17 DIAGNOSIS — N186 End stage renal disease: Secondary | ICD-10-CM | POA: Diagnosis not present

## 2017-09-17 DIAGNOSIS — E1122 Type 2 diabetes mellitus with diabetic chronic kidney disease: Secondary | ICD-10-CM | POA: Diagnosis not present

## 2017-09-17 DIAGNOSIS — Z992 Dependence on renal dialysis: Secondary | ICD-10-CM | POA: Diagnosis not present

## 2017-09-18 DIAGNOSIS — N2581 Secondary hyperparathyroidism of renal origin: Secondary | ICD-10-CM | POA: Diagnosis not present

## 2017-09-18 DIAGNOSIS — D649 Anemia, unspecified: Secondary | ICD-10-CM | POA: Diagnosis not present

## 2017-09-18 DIAGNOSIS — N186 End stage renal disease: Secondary | ICD-10-CM | POA: Diagnosis not present

## 2017-09-18 DIAGNOSIS — D631 Anemia in chronic kidney disease: Secondary | ICD-10-CM | POA: Diagnosis not present

## 2017-09-18 DIAGNOSIS — D509 Iron deficiency anemia, unspecified: Secondary | ICD-10-CM | POA: Diagnosis not present

## 2017-09-20 DIAGNOSIS — D509 Iron deficiency anemia, unspecified: Secondary | ICD-10-CM | POA: Diagnosis not present

## 2017-09-20 DIAGNOSIS — N186 End stage renal disease: Secondary | ICD-10-CM | POA: Diagnosis not present

## 2017-09-20 DIAGNOSIS — D631 Anemia in chronic kidney disease: Secondary | ICD-10-CM | POA: Diagnosis not present

## 2017-09-20 DIAGNOSIS — N2581 Secondary hyperparathyroidism of renal origin: Secondary | ICD-10-CM | POA: Diagnosis not present

## 2017-09-20 DIAGNOSIS — D649 Anemia, unspecified: Secondary | ICD-10-CM | POA: Diagnosis not present

## 2017-09-22 DIAGNOSIS — N186 End stage renal disease: Secondary | ICD-10-CM | POA: Diagnosis not present

## 2017-09-22 DIAGNOSIS — I871 Compression of vein: Secondary | ICD-10-CM | POA: Diagnosis not present

## 2017-09-22 DIAGNOSIS — Z992 Dependence on renal dialysis: Secondary | ICD-10-CM | POA: Diagnosis not present

## 2017-09-23 DIAGNOSIS — N2581 Secondary hyperparathyroidism of renal origin: Secondary | ICD-10-CM | POA: Diagnosis not present

## 2017-09-23 DIAGNOSIS — D631 Anemia in chronic kidney disease: Secondary | ICD-10-CM | POA: Diagnosis not present

## 2017-09-23 DIAGNOSIS — D509 Iron deficiency anemia, unspecified: Secondary | ICD-10-CM | POA: Diagnosis not present

## 2017-09-23 DIAGNOSIS — N186 End stage renal disease: Secondary | ICD-10-CM | POA: Diagnosis not present

## 2017-09-23 DIAGNOSIS — D649 Anemia, unspecified: Secondary | ICD-10-CM | POA: Diagnosis not present

## 2017-09-25 DIAGNOSIS — N2581 Secondary hyperparathyroidism of renal origin: Secondary | ICD-10-CM | POA: Diagnosis not present

## 2017-09-25 DIAGNOSIS — N186 End stage renal disease: Secondary | ICD-10-CM | POA: Diagnosis not present

## 2017-09-25 DIAGNOSIS — D649 Anemia, unspecified: Secondary | ICD-10-CM | POA: Diagnosis not present

## 2017-09-25 DIAGNOSIS — D509 Iron deficiency anemia, unspecified: Secondary | ICD-10-CM | POA: Diagnosis not present

## 2017-09-25 DIAGNOSIS — D631 Anemia in chronic kidney disease: Secondary | ICD-10-CM | POA: Diagnosis not present

## 2017-09-27 DIAGNOSIS — N186 End stage renal disease: Secondary | ICD-10-CM | POA: Diagnosis not present

## 2017-09-27 DIAGNOSIS — D649 Anemia, unspecified: Secondary | ICD-10-CM | POA: Diagnosis not present

## 2017-09-27 DIAGNOSIS — N2581 Secondary hyperparathyroidism of renal origin: Secondary | ICD-10-CM | POA: Diagnosis not present

## 2017-09-27 DIAGNOSIS — D631 Anemia in chronic kidney disease: Secondary | ICD-10-CM | POA: Diagnosis not present

## 2017-09-27 DIAGNOSIS — D509 Iron deficiency anemia, unspecified: Secondary | ICD-10-CM | POA: Diagnosis not present

## 2017-09-29 DIAGNOSIS — E113512 Type 2 diabetes mellitus with proliferative diabetic retinopathy with macular edema, left eye: Secondary | ICD-10-CM | POA: Diagnosis not present

## 2017-09-29 DIAGNOSIS — H40052 Ocular hypertension, left eye: Secondary | ICD-10-CM | POA: Diagnosis not present

## 2017-09-29 DIAGNOSIS — H16431 Localized vascularization of cornea, right eye: Secondary | ICD-10-CM | POA: Diagnosis not present

## 2017-09-29 DIAGNOSIS — H1811 Bullous keratopathy, right eye: Secondary | ICD-10-CM | POA: Diagnosis not present

## 2017-09-30 DIAGNOSIS — D631 Anemia in chronic kidney disease: Secondary | ICD-10-CM | POA: Diagnosis not present

## 2017-09-30 DIAGNOSIS — D509 Iron deficiency anemia, unspecified: Secondary | ICD-10-CM | POA: Diagnosis not present

## 2017-09-30 DIAGNOSIS — N186 End stage renal disease: Secondary | ICD-10-CM | POA: Diagnosis not present

## 2017-09-30 DIAGNOSIS — N2581 Secondary hyperparathyroidism of renal origin: Secondary | ICD-10-CM | POA: Diagnosis not present

## 2017-09-30 DIAGNOSIS — D649 Anemia, unspecified: Secondary | ICD-10-CM | POA: Diagnosis not present

## 2017-10-02 DIAGNOSIS — D649 Anemia, unspecified: Secondary | ICD-10-CM | POA: Diagnosis not present

## 2017-10-02 DIAGNOSIS — D509 Iron deficiency anemia, unspecified: Secondary | ICD-10-CM | POA: Diagnosis not present

## 2017-10-02 DIAGNOSIS — D631 Anemia in chronic kidney disease: Secondary | ICD-10-CM | POA: Diagnosis not present

## 2017-10-02 DIAGNOSIS — N2581 Secondary hyperparathyroidism of renal origin: Secondary | ICD-10-CM | POA: Diagnosis not present

## 2017-10-02 DIAGNOSIS — N186 End stage renal disease: Secondary | ICD-10-CM | POA: Diagnosis not present

## 2017-10-04 DIAGNOSIS — N2581 Secondary hyperparathyroidism of renal origin: Secondary | ICD-10-CM | POA: Diagnosis not present

## 2017-10-04 DIAGNOSIS — D649 Anemia, unspecified: Secondary | ICD-10-CM | POA: Diagnosis not present

## 2017-10-04 DIAGNOSIS — N186 End stage renal disease: Secondary | ICD-10-CM | POA: Diagnosis not present

## 2017-10-04 DIAGNOSIS — D509 Iron deficiency anemia, unspecified: Secondary | ICD-10-CM | POA: Diagnosis not present

## 2017-10-04 DIAGNOSIS — D631 Anemia in chronic kidney disease: Secondary | ICD-10-CM | POA: Diagnosis not present

## 2017-10-06 DIAGNOSIS — Z452 Encounter for adjustment and management of vascular access device: Secondary | ICD-10-CM | POA: Diagnosis not present

## 2017-10-06 MED FILL — FUROSEMIDE 80 MG TABS: 80 | 30 days supply | Qty: 60 | Fill #2

## 2017-10-06 MED FILL — CARVEDILOL 6.25 MG TABLET: 6.25 | 15 days supply | Qty: 60 | Fill #3

## 2017-10-06 MED FILL — AMLODIPINE 2.5 MG TABLET: 2.5 | 30 days supply | Qty: 30 | Fill #4

## 2017-10-06 MED FILL — GABAPENTIN 300 MG CAPSULE: 300 | 30 days supply | Qty: 30 | Fill #3

## 2017-10-06 MED FILL — ATORVASTATIN CALCIUM 40 MG: 40 | 30 days supply | Qty: 30 | Fill #0

## 2017-10-07 DIAGNOSIS — N2581 Secondary hyperparathyroidism of renal origin: Secondary | ICD-10-CM | POA: Diagnosis not present

## 2017-10-07 DIAGNOSIS — N186 End stage renal disease: Secondary | ICD-10-CM | POA: Diagnosis not present

## 2017-10-07 DIAGNOSIS — D509 Iron deficiency anemia, unspecified: Secondary | ICD-10-CM | POA: Diagnosis not present

## 2017-10-07 DIAGNOSIS — D631 Anemia in chronic kidney disease: Secondary | ICD-10-CM | POA: Diagnosis not present

## 2017-10-07 DIAGNOSIS — D649 Anemia, unspecified: Secondary | ICD-10-CM | POA: Diagnosis not present

## 2017-10-09 DIAGNOSIS — D649 Anemia, unspecified: Secondary | ICD-10-CM | POA: Diagnosis not present

## 2017-10-09 DIAGNOSIS — N2581 Secondary hyperparathyroidism of renal origin: Secondary | ICD-10-CM | POA: Diagnosis not present

## 2017-10-09 DIAGNOSIS — N186 End stage renal disease: Secondary | ICD-10-CM | POA: Diagnosis not present

## 2017-10-09 DIAGNOSIS — D509 Iron deficiency anemia, unspecified: Secondary | ICD-10-CM | POA: Diagnosis not present

## 2017-10-09 DIAGNOSIS — D631 Anemia in chronic kidney disease: Secondary | ICD-10-CM | POA: Diagnosis not present

## 2017-10-11 DIAGNOSIS — N2581 Secondary hyperparathyroidism of renal origin: Secondary | ICD-10-CM | POA: Diagnosis not present

## 2017-10-11 DIAGNOSIS — D649 Anemia, unspecified: Secondary | ICD-10-CM | POA: Diagnosis not present

## 2017-10-11 DIAGNOSIS — D509 Iron deficiency anemia, unspecified: Secondary | ICD-10-CM | POA: Diagnosis not present

## 2017-10-11 DIAGNOSIS — N186 End stage renal disease: Secondary | ICD-10-CM | POA: Diagnosis not present

## 2017-10-11 DIAGNOSIS — D631 Anemia in chronic kidney disease: Secondary | ICD-10-CM | POA: Diagnosis not present

## 2017-10-14 DIAGNOSIS — N186 End stage renal disease: Secondary | ICD-10-CM | POA: Diagnosis not present

## 2017-10-14 DIAGNOSIS — D631 Anemia in chronic kidney disease: Secondary | ICD-10-CM | POA: Diagnosis not present

## 2017-10-14 DIAGNOSIS — D649 Anemia, unspecified: Secondary | ICD-10-CM | POA: Diagnosis not present

## 2017-10-14 DIAGNOSIS — D509 Iron deficiency anemia, unspecified: Secondary | ICD-10-CM | POA: Diagnosis not present

## 2017-10-14 DIAGNOSIS — N2581 Secondary hyperparathyroidism of renal origin: Secondary | ICD-10-CM | POA: Diagnosis not present

## 2017-10-14 MED FILL — NovoLOG 100 UNIT/ML SOLN: 100 | 27 days supply | Qty: 10 | Fill #3

## 2017-10-14 MED FILL — LANTUS 100 UNITS/ML VIAL: 100 | 28 days supply | Qty: 10 | Fill #2

## 2017-10-16 DIAGNOSIS — N2581 Secondary hyperparathyroidism of renal origin: Secondary | ICD-10-CM | POA: Diagnosis not present

## 2017-10-16 DIAGNOSIS — N186 End stage renal disease: Secondary | ICD-10-CM | POA: Diagnosis not present

## 2017-10-16 DIAGNOSIS — D631 Anemia in chronic kidney disease: Secondary | ICD-10-CM | POA: Diagnosis not present

## 2017-10-16 DIAGNOSIS — D509 Iron deficiency anemia, unspecified: Secondary | ICD-10-CM | POA: Diagnosis not present

## 2017-10-16 DIAGNOSIS — D649 Anemia, unspecified: Secondary | ICD-10-CM | POA: Diagnosis not present

## 2017-10-18 DIAGNOSIS — Z23 Encounter for immunization: Secondary | ICD-10-CM | POA: Diagnosis not present

## 2017-10-18 DIAGNOSIS — D631 Anemia in chronic kidney disease: Secondary | ICD-10-CM | POA: Diagnosis not present

## 2017-10-18 DIAGNOSIS — N186 End stage renal disease: Secondary | ICD-10-CM | POA: Diagnosis not present

## 2017-10-18 DIAGNOSIS — Z992 Dependence on renal dialysis: Secondary | ICD-10-CM | POA: Diagnosis not present

## 2017-10-18 DIAGNOSIS — E1122 Type 2 diabetes mellitus with diabetic chronic kidney disease: Secondary | ICD-10-CM | POA: Diagnosis not present

## 2017-10-18 DIAGNOSIS — N2581 Secondary hyperparathyroidism of renal origin: Secondary | ICD-10-CM | POA: Diagnosis not present

## 2017-10-20 DIAGNOSIS — E113512 Type 2 diabetes mellitus with proliferative diabetic retinopathy with macular edema, left eye: Secondary | ICD-10-CM | POA: Diagnosis not present

## 2017-10-21 DIAGNOSIS — N186 End stage renal disease: Secondary | ICD-10-CM | POA: Diagnosis not present

## 2017-10-21 DIAGNOSIS — Z23 Encounter for immunization: Secondary | ICD-10-CM | POA: Diagnosis not present

## 2017-10-21 DIAGNOSIS — D631 Anemia in chronic kidney disease: Secondary | ICD-10-CM | POA: Diagnosis not present

## 2017-10-21 DIAGNOSIS — N2581 Secondary hyperparathyroidism of renal origin: Secondary | ICD-10-CM | POA: Diagnosis not present

## 2017-10-23 DIAGNOSIS — N2581 Secondary hyperparathyroidism of renal origin: Secondary | ICD-10-CM | POA: Diagnosis not present

## 2017-10-23 DIAGNOSIS — Z23 Encounter for immunization: Secondary | ICD-10-CM | POA: Diagnosis not present

## 2017-10-23 DIAGNOSIS — D631 Anemia in chronic kidney disease: Secondary | ICD-10-CM | POA: Diagnosis not present

## 2017-10-23 DIAGNOSIS — N186 End stage renal disease: Secondary | ICD-10-CM | POA: Diagnosis not present

## 2017-10-25 DIAGNOSIS — Z23 Encounter for immunization: Secondary | ICD-10-CM | POA: Diagnosis not present

## 2017-10-25 DIAGNOSIS — N2581 Secondary hyperparathyroidism of renal origin: Secondary | ICD-10-CM | POA: Diagnosis not present

## 2017-10-25 DIAGNOSIS — D631 Anemia in chronic kidney disease: Secondary | ICD-10-CM | POA: Diagnosis not present

## 2017-10-25 DIAGNOSIS — N186 End stage renal disease: Secondary | ICD-10-CM | POA: Diagnosis not present

## 2017-10-28 DIAGNOSIS — Z23 Encounter for immunization: Secondary | ICD-10-CM | POA: Diagnosis not present

## 2017-10-28 DIAGNOSIS — D631 Anemia in chronic kidney disease: Secondary | ICD-10-CM | POA: Diagnosis not present

## 2017-10-28 DIAGNOSIS — N186 End stage renal disease: Secondary | ICD-10-CM | POA: Diagnosis not present

## 2017-10-28 DIAGNOSIS — N2581 Secondary hyperparathyroidism of renal origin: Secondary | ICD-10-CM | POA: Diagnosis not present

## 2017-10-30 DIAGNOSIS — N186 End stage renal disease: Secondary | ICD-10-CM | POA: Diagnosis not present

## 2017-10-30 DIAGNOSIS — D631 Anemia in chronic kidney disease: Secondary | ICD-10-CM | POA: Diagnosis not present

## 2017-10-30 DIAGNOSIS — Z23 Encounter for immunization: Secondary | ICD-10-CM | POA: Diagnosis not present

## 2017-10-30 DIAGNOSIS — N2581 Secondary hyperparathyroidism of renal origin: Secondary | ICD-10-CM | POA: Diagnosis not present

## 2017-11-01 DIAGNOSIS — N186 End stage renal disease: Secondary | ICD-10-CM | POA: Diagnosis not present

## 2017-11-01 DIAGNOSIS — D631 Anemia in chronic kidney disease: Secondary | ICD-10-CM | POA: Diagnosis not present

## 2017-11-01 DIAGNOSIS — N2581 Secondary hyperparathyroidism of renal origin: Secondary | ICD-10-CM | POA: Diagnosis not present

## 2017-11-01 DIAGNOSIS — Z23 Encounter for immunization: Secondary | ICD-10-CM | POA: Diagnosis not present

## 2017-11-04 DIAGNOSIS — D631 Anemia in chronic kidney disease: Secondary | ICD-10-CM | POA: Diagnosis not present

## 2017-11-04 DIAGNOSIS — N186 End stage renal disease: Secondary | ICD-10-CM | POA: Diagnosis not present

## 2017-11-04 DIAGNOSIS — Z23 Encounter for immunization: Secondary | ICD-10-CM | POA: Diagnosis not present

## 2017-11-04 DIAGNOSIS — N2581 Secondary hyperparathyroidism of renal origin: Secondary | ICD-10-CM | POA: Diagnosis not present

## 2017-11-06 DIAGNOSIS — N2581 Secondary hyperparathyroidism of renal origin: Secondary | ICD-10-CM | POA: Diagnosis not present

## 2017-11-06 DIAGNOSIS — N186 End stage renal disease: Secondary | ICD-10-CM | POA: Diagnosis not present

## 2017-11-06 DIAGNOSIS — D631 Anemia in chronic kidney disease: Secondary | ICD-10-CM | POA: Diagnosis not present

## 2017-11-06 DIAGNOSIS — Z23 Encounter for immunization: Secondary | ICD-10-CM | POA: Diagnosis not present

## 2017-11-08 DIAGNOSIS — N186 End stage renal disease: Secondary | ICD-10-CM | POA: Diagnosis not present

## 2017-11-08 DIAGNOSIS — D631 Anemia in chronic kidney disease: Secondary | ICD-10-CM | POA: Diagnosis not present

## 2017-11-08 DIAGNOSIS — Z23 Encounter for immunization: Secondary | ICD-10-CM | POA: Diagnosis not present

## 2017-11-08 DIAGNOSIS — N2581 Secondary hyperparathyroidism of renal origin: Secondary | ICD-10-CM | POA: Diagnosis not present

## 2017-11-11 DIAGNOSIS — N2581 Secondary hyperparathyroidism of renal origin: Secondary | ICD-10-CM | POA: Diagnosis not present

## 2017-11-11 DIAGNOSIS — N186 End stage renal disease: Secondary | ICD-10-CM | POA: Diagnosis not present

## 2017-11-11 DIAGNOSIS — D631 Anemia in chronic kidney disease: Secondary | ICD-10-CM | POA: Diagnosis not present

## 2017-11-11 DIAGNOSIS — Z23 Encounter for immunization: Secondary | ICD-10-CM | POA: Diagnosis not present

## 2017-11-12 MED FILL — CARVEDILOL 6.25 MG TABLET: 6.25 | 15 days supply | Qty: 60 | Fill #4

## 2017-11-12 MED FILL — FUROSEMIDE 80 MG TABS: 80 | 30 days supply | Qty: 60 | Fill #3

## 2017-11-12 MED FILL — GABAPENTIN 300 MG CAPSULE: 300 | 30 days supply | Qty: 30 | Fill #4

## 2017-11-12 MED FILL — AMLODIPINE 2.5 MG TABLET: 2.5 | 30 days supply | Qty: 30 | Fill #5

## 2017-11-12 MED FILL — ATORVASTATIN CALCIUM 40 MG: 40 | 30 days supply | Qty: 30 | Fill #1

## 2017-11-13 DIAGNOSIS — D631 Anemia in chronic kidney disease: Secondary | ICD-10-CM | POA: Diagnosis not present

## 2017-11-13 DIAGNOSIS — Z23 Encounter for immunization: Secondary | ICD-10-CM | POA: Diagnosis not present

## 2017-11-13 DIAGNOSIS — N2581 Secondary hyperparathyroidism of renal origin: Secondary | ICD-10-CM | POA: Diagnosis not present

## 2017-11-13 DIAGNOSIS — N186 End stage renal disease: Secondary | ICD-10-CM | POA: Diagnosis not present

## 2017-11-15 DIAGNOSIS — D631 Anemia in chronic kidney disease: Secondary | ICD-10-CM | POA: Diagnosis not present

## 2017-11-15 DIAGNOSIS — N2581 Secondary hyperparathyroidism of renal origin: Secondary | ICD-10-CM | POA: Diagnosis not present

## 2017-11-15 DIAGNOSIS — N186 End stage renal disease: Secondary | ICD-10-CM | POA: Diagnosis not present

## 2017-11-15 DIAGNOSIS — Z23 Encounter for immunization: Secondary | ICD-10-CM | POA: Diagnosis not present

## 2017-11-17 DIAGNOSIS — E1122 Type 2 diabetes mellitus with diabetic chronic kidney disease: Secondary | ICD-10-CM | POA: Diagnosis not present

## 2017-11-17 DIAGNOSIS — N186 End stage renal disease: Secondary | ICD-10-CM | POA: Diagnosis not present

## 2017-11-17 DIAGNOSIS — Z992 Dependence on renal dialysis: Secondary | ICD-10-CM | POA: Diagnosis not present

## 2017-11-18 DIAGNOSIS — N186 End stage renal disease: Secondary | ICD-10-CM | POA: Diagnosis not present

## 2017-11-18 DIAGNOSIS — D631 Anemia in chronic kidney disease: Secondary | ICD-10-CM | POA: Diagnosis not present

## 2017-11-18 DIAGNOSIS — D509 Iron deficiency anemia, unspecified: Secondary | ICD-10-CM | POA: Diagnosis not present

## 2017-11-18 DIAGNOSIS — E1129 Type 2 diabetes mellitus with other diabetic kidney complication: Secondary | ICD-10-CM | POA: Diagnosis not present

## 2017-11-18 DIAGNOSIS — N2581 Secondary hyperparathyroidism of renal origin: Secondary | ICD-10-CM | POA: Diagnosis not present

## 2017-11-20 DIAGNOSIS — N186 End stage renal disease: Secondary | ICD-10-CM | POA: Diagnosis not present

## 2017-11-20 DIAGNOSIS — E1129 Type 2 diabetes mellitus with other diabetic kidney complication: Secondary | ICD-10-CM | POA: Diagnosis not present

## 2017-11-20 DIAGNOSIS — D631 Anemia in chronic kidney disease: Secondary | ICD-10-CM | POA: Diagnosis not present

## 2017-11-20 DIAGNOSIS — N2581 Secondary hyperparathyroidism of renal origin: Secondary | ICD-10-CM | POA: Diagnosis not present

## 2017-11-20 DIAGNOSIS — D509 Iron deficiency anemia, unspecified: Secondary | ICD-10-CM | POA: Diagnosis not present

## 2017-11-22 DIAGNOSIS — N186 End stage renal disease: Secondary | ICD-10-CM | POA: Diagnosis not present

## 2017-11-22 DIAGNOSIS — D631 Anemia in chronic kidney disease: Secondary | ICD-10-CM | POA: Diagnosis not present

## 2017-11-22 DIAGNOSIS — N2581 Secondary hyperparathyroidism of renal origin: Secondary | ICD-10-CM | POA: Diagnosis not present

## 2017-11-22 DIAGNOSIS — D509 Iron deficiency anemia, unspecified: Secondary | ICD-10-CM | POA: Diagnosis not present

## 2017-11-22 DIAGNOSIS — E1129 Type 2 diabetes mellitus with other diabetic kidney complication: Secondary | ICD-10-CM | POA: Diagnosis not present

## 2017-11-24 DIAGNOSIS — E113512 Type 2 diabetes mellitus with proliferative diabetic retinopathy with macular edema, left eye: Secondary | ICD-10-CM | POA: Diagnosis not present

## 2017-11-25 DIAGNOSIS — N2581 Secondary hyperparathyroidism of renal origin: Secondary | ICD-10-CM | POA: Diagnosis not present

## 2017-11-25 DIAGNOSIS — D509 Iron deficiency anemia, unspecified: Secondary | ICD-10-CM | POA: Diagnosis not present

## 2017-11-25 DIAGNOSIS — D631 Anemia in chronic kidney disease: Secondary | ICD-10-CM | POA: Diagnosis not present

## 2017-11-25 DIAGNOSIS — N186 End stage renal disease: Secondary | ICD-10-CM | POA: Diagnosis not present

## 2017-11-25 DIAGNOSIS — E1129 Type 2 diabetes mellitus with other diabetic kidney complication: Secondary | ICD-10-CM | POA: Diagnosis not present

## 2017-11-27 DIAGNOSIS — D509 Iron deficiency anemia, unspecified: Secondary | ICD-10-CM | POA: Diagnosis not present

## 2017-11-27 DIAGNOSIS — N2581 Secondary hyperparathyroidism of renal origin: Secondary | ICD-10-CM | POA: Diagnosis not present

## 2017-11-27 DIAGNOSIS — D631 Anemia in chronic kidney disease: Secondary | ICD-10-CM | POA: Diagnosis not present

## 2017-11-27 DIAGNOSIS — N186 End stage renal disease: Secondary | ICD-10-CM | POA: Diagnosis not present

## 2017-11-27 DIAGNOSIS — E1129 Type 2 diabetes mellitus with other diabetic kidney complication: Secondary | ICD-10-CM | POA: Diagnosis not present

## 2017-11-29 DIAGNOSIS — N186 End stage renal disease: Secondary | ICD-10-CM | POA: Diagnosis not present

## 2017-11-29 DIAGNOSIS — D631 Anemia in chronic kidney disease: Secondary | ICD-10-CM | POA: Diagnosis not present

## 2017-11-29 DIAGNOSIS — D509 Iron deficiency anemia, unspecified: Secondary | ICD-10-CM | POA: Diagnosis not present

## 2017-11-29 DIAGNOSIS — N2581 Secondary hyperparathyroidism of renal origin: Secondary | ICD-10-CM | POA: Diagnosis not present

## 2017-11-29 DIAGNOSIS — E1129 Type 2 diabetes mellitus with other diabetic kidney complication: Secondary | ICD-10-CM | POA: Diagnosis not present

## 2017-12-01 MED FILL — NovoLOG 100 UNIT/ML SOLN: 100 | 27 days supply | Qty: 10 | Fill #4

## 2017-12-01 MED FILL — LANTUS 100 UNITS/ML VIAL: 100 | 28 days supply | Qty: 10 | Fill #3

## 2017-12-02 DIAGNOSIS — N2581 Secondary hyperparathyroidism of renal origin: Secondary | ICD-10-CM | POA: Diagnosis not present

## 2017-12-02 DIAGNOSIS — D509 Iron deficiency anemia, unspecified: Secondary | ICD-10-CM | POA: Diagnosis not present

## 2017-12-02 DIAGNOSIS — D631 Anemia in chronic kidney disease: Secondary | ICD-10-CM | POA: Diagnosis not present

## 2017-12-02 DIAGNOSIS — N186 End stage renal disease: Secondary | ICD-10-CM | POA: Diagnosis not present

## 2017-12-02 DIAGNOSIS — E1129 Type 2 diabetes mellitus with other diabetic kidney complication: Secondary | ICD-10-CM | POA: Diagnosis not present

## 2017-12-04 DIAGNOSIS — D631 Anemia in chronic kidney disease: Secondary | ICD-10-CM | POA: Diagnosis not present

## 2017-12-04 DIAGNOSIS — N2581 Secondary hyperparathyroidism of renal origin: Secondary | ICD-10-CM | POA: Diagnosis not present

## 2017-12-04 DIAGNOSIS — N186 End stage renal disease: Secondary | ICD-10-CM | POA: Diagnosis not present

## 2017-12-04 DIAGNOSIS — D509 Iron deficiency anemia, unspecified: Secondary | ICD-10-CM | POA: Diagnosis not present

## 2017-12-04 DIAGNOSIS — E1129 Type 2 diabetes mellitus with other diabetic kidney complication: Secondary | ICD-10-CM | POA: Diagnosis not present

## 2017-12-06 DIAGNOSIS — N186 End stage renal disease: Secondary | ICD-10-CM | POA: Diagnosis not present

## 2017-12-06 DIAGNOSIS — E1129 Type 2 diabetes mellitus with other diabetic kidney complication: Secondary | ICD-10-CM | POA: Diagnosis not present

## 2017-12-06 DIAGNOSIS — D631 Anemia in chronic kidney disease: Secondary | ICD-10-CM | POA: Diagnosis not present

## 2017-12-06 DIAGNOSIS — D509 Iron deficiency anemia, unspecified: Secondary | ICD-10-CM | POA: Diagnosis not present

## 2017-12-06 DIAGNOSIS — N2581 Secondary hyperparathyroidism of renal origin: Secondary | ICD-10-CM | POA: Diagnosis not present

## 2017-12-09 DIAGNOSIS — N2581 Secondary hyperparathyroidism of renal origin: Secondary | ICD-10-CM | POA: Diagnosis not present

## 2017-12-09 DIAGNOSIS — D631 Anemia in chronic kidney disease: Secondary | ICD-10-CM | POA: Diagnosis not present

## 2017-12-09 DIAGNOSIS — D509 Iron deficiency anemia, unspecified: Secondary | ICD-10-CM | POA: Diagnosis not present

## 2017-12-09 DIAGNOSIS — E1129 Type 2 diabetes mellitus with other diabetic kidney complication: Secondary | ICD-10-CM | POA: Diagnosis not present

## 2017-12-09 DIAGNOSIS — N186 End stage renal disease: Secondary | ICD-10-CM | POA: Diagnosis not present

## 2017-12-11 DIAGNOSIS — N2581 Secondary hyperparathyroidism of renal origin: Secondary | ICD-10-CM | POA: Diagnosis not present

## 2017-12-11 DIAGNOSIS — D631 Anemia in chronic kidney disease: Secondary | ICD-10-CM | POA: Diagnosis not present

## 2017-12-11 DIAGNOSIS — N186 End stage renal disease: Secondary | ICD-10-CM | POA: Diagnosis not present

## 2017-12-11 DIAGNOSIS — E1129 Type 2 diabetes mellitus with other diabetic kidney complication: Secondary | ICD-10-CM | POA: Diagnosis not present

## 2017-12-11 DIAGNOSIS — D509 Iron deficiency anemia, unspecified: Secondary | ICD-10-CM | POA: Diagnosis not present

## 2017-12-13 DIAGNOSIS — D509 Iron deficiency anemia, unspecified: Secondary | ICD-10-CM | POA: Diagnosis not present

## 2017-12-13 DIAGNOSIS — N2581 Secondary hyperparathyroidism of renal origin: Secondary | ICD-10-CM | POA: Diagnosis not present

## 2017-12-13 DIAGNOSIS — E1129 Type 2 diabetes mellitus with other diabetic kidney complication: Secondary | ICD-10-CM | POA: Diagnosis not present

## 2017-12-13 DIAGNOSIS — N186 End stage renal disease: Secondary | ICD-10-CM | POA: Diagnosis not present

## 2017-12-13 DIAGNOSIS — D631 Anemia in chronic kidney disease: Secondary | ICD-10-CM | POA: Diagnosis not present

## 2017-12-15 ENCOUNTER — Other Ambulatory Visit: Payer: Self-pay | Admitting: Family Medicine

## 2017-12-15 ENCOUNTER — Encounter: Payer: Self-pay | Admitting: Family Medicine

## 2017-12-15 ENCOUNTER — Telehealth: Payer: Self-pay

## 2017-12-15 ENCOUNTER — Ambulatory Visit: Payer: Medicare Other | Attending: Family Medicine | Admitting: Family Medicine

## 2017-12-15 VITALS — BP 147/75 | HR 53 | Temp 97.6°F | Ht 71.0 in | Wt 159.2 lb

## 2017-12-15 DIAGNOSIS — Z9889 Other specified postprocedural states: Secondary | ICD-10-CM | POA: Insufficient documentation

## 2017-12-15 DIAGNOSIS — N62 Hypertrophy of breast: Secondary | ICD-10-CM | POA: Insufficient documentation

## 2017-12-15 DIAGNOSIS — E1141 Type 2 diabetes mellitus with diabetic mononeuropathy: Secondary | ICD-10-CM | POA: Insufficient documentation

## 2017-12-15 DIAGNOSIS — I10 Essential (primary) hypertension: Secondary | ICD-10-CM | POA: Diagnosis not present

## 2017-12-15 DIAGNOSIS — Z992 Dependence on renal dialysis: Secondary | ICD-10-CM | POA: Insufficient documentation

## 2017-12-15 DIAGNOSIS — E1122 Type 2 diabetes mellitus with diabetic chronic kidney disease: Secondary | ICD-10-CM | POA: Insufficient documentation

## 2017-12-15 DIAGNOSIS — Z7982 Long term (current) use of aspirin: Secondary | ICD-10-CM | POA: Insufficient documentation

## 2017-12-15 DIAGNOSIS — Z8673 Personal history of transient ischemic attack (TIA), and cerebral infarction without residual deficits: Secondary | ICD-10-CM | POA: Diagnosis not present

## 2017-12-15 DIAGNOSIS — E7800 Pure hypercholesterolemia, unspecified: Secondary | ICD-10-CM | POA: Diagnosis not present

## 2017-12-15 DIAGNOSIS — Z794 Long term (current) use of insulin: Secondary | ICD-10-CM | POA: Diagnosis not present

## 2017-12-15 DIAGNOSIS — I120 Hypertensive chronic kidney disease with stage 5 chronic kidney disease or end stage renal disease: Secondary | ICD-10-CM | POA: Insufficient documentation

## 2017-12-15 DIAGNOSIS — E0841 Diabetes mellitus due to underlying condition with diabetic mononeuropathy: Secondary | ICD-10-CM

## 2017-12-15 DIAGNOSIS — Z79899 Other long term (current) drug therapy: Secondary | ICD-10-CM | POA: Insufficient documentation

## 2017-12-15 DIAGNOSIS — N186 End stage renal disease: Secondary | ICD-10-CM | POA: Insufficient documentation

## 2017-12-15 DIAGNOSIS — I2510 Atherosclerotic heart disease of native coronary artery without angina pectoris: Secondary | ICD-10-CM | POA: Diagnosis not present

## 2017-12-15 DIAGNOSIS — E118 Type 2 diabetes mellitus with unspecified complications: Secondary | ICD-10-CM

## 2017-12-15 LAB — POCT GLYCOSYLATED HEMOGLOBIN (HGB A1C): HBA1C, POC (CONTROLLED DIABETIC RANGE): 6.6 % (ref 0.0–7.0)

## 2017-12-15 LAB — GLUCOSE, POCT (MANUAL RESULT ENTRY): POC GLUCOSE: 105 mg/dL — AB (ref 70–99)

## 2017-12-15 MED ORDER — ATORVASTATIN CALCIUM 40 MG PO TABS: 40.0000 mg | ORAL_TABLET | Freq: Every day | ORAL | 5 refills | Status: DC

## 2017-12-15 MED ORDER — INSULIN ASPART 100 UNIT/ML ~~LOC~~ SOLN: SUBCUTANEOUS | 5 refills | Status: DC

## 2017-12-15 MED ORDER — AMLODIPINE BESYLATE 2.5 MG PO TABS: 2.5000 mg | ORAL_TABLET | Freq: Every day | ORAL | 5 refills | Status: DC

## 2017-12-15 MED ORDER — INSULIN GLARGINE 100 UNIT/ML ~~LOC~~ SOLN: SUBCUTANEOUS | 5 refills | Status: DC

## 2017-12-15 MED ORDER — GABAPENTIN 300 MG PO CAPS: 300.0000 mg | ORAL_CAPSULE | Freq: Every day | ORAL | 5 refills | Status: DC

## 2017-12-15 MED FILL — CARVEDILOL 6.25 MG TABLET: 6.25 | 15 days supply | Qty: 60 | Fill #5

## 2017-12-15 MED FILL — AMLODIPINE 2.5 MG TABLET: 2.5 | 30 days supply | Qty: 30 | Fill #0

## 2017-12-15 MED FILL — ATORVASTATIN CALCIUM 40 MG: 40 | 30 days supply | Qty: 30 | Fill #2

## 2017-12-15 MED FILL — GABAPENTIN 300 MG CAPSULE: 300 | 30 days supply | Qty: 30 | Fill #5

## 2017-12-15 NOTE — Progress Notes (Addendum)
Subjective:  Patient ID: Russell Price, male    DOB: [birth year]  Age: 57 y.o. MRN: [id number]  CC: Diabetes Mellitus  HPI Russell Price is a 57 year old male with a history of hypertension, type 2 diabetes mellitus (A1c 6.6), diabetic neuropathy, blind in the right eye, end-stage renal disease (currently on hemodialysis on Tuesdays, Thursdays and Saturdays) who comes in today for a follow-up visit. His A1c is 6.6 today which has trended down from 8.1 previously and he denies hypoglycemic episodes.  He denies neuropathy or visual concerns and is up-to-date on his annual eye exam. He is wondering if he can obtain the freestyle libre glucometer so he does not have the prick his finger. For his hypertension he is tolerating his medications and also tolerating dialysis sessions. He is concerned about his bilateral breast lumps which are not painful and has been present for couple of months.  Mammogram from 08/2017 was negative for malignancy. His wife is concerned about him not having Medicaid and the fact that their food stamps had been cut down to $15 per month and her income is not sufficient to support them both.  Past Medical History:  Diagnosis Date  . CAD (coronary artery disease)    a. underwent cath in 02/2016 after an abnormal nuc which showed 2V CAD in RCA and OM3 with no good targets for PCI. Medical managment recommended.   . Chronic kidney disease   . Diabetes mellitus without complication ([place])    type II - followed by Dr Chalmers Cater   . Difficult intubation    ER intubation 2016  . ESRD (end stage renal disease) ([place])    Tues, Th, Sat [place]  . Glaucoma   . Hemodialysis patient [facility])    has been on dialysis since approx 10/2016   . Hypertension    followed by [facility]  . Stroke [facility])    patient denies on 02/19/2017     Past Surgical History:  Procedure Laterality Date  . A/V FISTULAGRAM Left 11/27/2016   Procedure: A/V Fistulagram;  Surgeon: Waynetta Sandy, MD;  Location: [place] [facility];  Service: Cardiovascular;  Laterality: Left;  . A/V FISTULAGRAM Left 05/07/2017   Procedure: A/V FISTULAGRAM;  Surgeon: Waynetta Sandy, MD;  Location: [place] [facility];  Service: Cardiovascular;  Laterality: Left;  . A/V FISTULAGRAM Left 07/14/2017   Procedure: A/V FISTULAGRAM;  Surgeon: Waynetta Sandy, MD;  Location: [place] [facility];  Service: Cardiovascular;  Laterality: Left;  . AV FISTULA PLACEMENT Left 03/12/2016   Procedure: Left Arm ARTERIOVENOUS (AV) FISTULA CREATION;  Surgeon: Waynetta Sandy, MD;  Location: [facility];  Service: Vascular;  Laterality: Left;  . AV FISTULA PLACEMENT Left 07/18/2017   Procedure: INSERTION OF ARTERIOVENOUS (AV) GORE-TEX GRAFT ARM LEFT UPPER ARM;  Surgeon: Conrad Wren, MD;  Location: [place];  Service: Vascular;  Laterality: Left;  . BASCILIC VEIN TRANSPOSITION Left 05/14/2016   Procedure: SECOND STAGE LEFT BASILIC VEIN TRANSPOSITION;  Surgeon: Waynetta Sandy, MD;  Location: [place];  Service: Vascular;  Laterality: Left;  . BRONCHOSCOPY  02/14/2015   for pulm hemorrhage  . CARDIAC CATHETERIZATION N/A 03/04/2016   Procedure: Left Heart Cath and Coronary Angiography;  Surgeon: Leonie Man, MD;  Location: [place] [facility];  Service: Cardiovascular;  Laterality: N/A;  . COLONOSCOPY WITH PROPOFOL N/A 04/07/2017   Procedure: COLONOSCOPY WITH PROPOFOL;  Surgeon: Ronnette Juniper, MD;  Location: [place];  Service: Gastroenterology;  Laterality: N/A;  . EYE SURGERY Left   . INSERTION OF DIALYSIS CATHETER Right 11/08/2016   Procedure: INSERTION OF DIALYSIS CATHETER;  Surgeon: Rosetta Posner, MD;  Location: [place];  Service: Vascular;  Laterality: Right;  . IR PARACENTESIS  11/13/2016  . LIGATION OF ARTERIOVENOUS  FISTULA Left 07/18/2017   Procedure: LIGATION OF ARTERIOVENOUS  FISTULA;  Surgeon: Conrad Norwalk, MD;  Location: [place];  Service: Vascular;  Laterality: Left;  .  PERIPHERAL VASCULAR BALLOON ANGIOPLASTY Left 11/27/2016   Procedure: Peripheral Vascular Balloon Angioplasty;  Surgeon: Waynetta Sandy, MD;  Location: [place] [facility];  Service: Cardiovascular;  Laterality: Left;  ARM FISTULA  . PERIPHERAL VASCULAR BALLOON ANGIOPLASTY Left 05/07/2017   Procedure: PERIPHERAL VASCULAR BALLOON ANGIOPLASTY;  Surgeon: Waynetta Sandy, MD;  Location: [place] [facility];  Service: Cardiovascular;  Laterality: Left;    No Known Allergies   Outpatient Medications Prior to Visit  Medication Sig Dispense Refill  . ACCU-CHEK SOFTCLIX LANCETS lancets Use as instructed 3 times daily before meals and at bedtime. E11.9 100 each 12  . aspirin 81 MG chewable tablet Chew 1 tablet (81 mg total) by mouth daily. (Patient taking differently: Chew 81 mg by mouth every evening. )    . Blood Glucose Monitoring Suppl (ACCU-CHEK AVIVA) device Use as instructed 3 times daily before meals and at bedtime. E11.9 1 each 0  . Blood Glucose Monitoring Suppl (TRUE METRIX METER) DEVI 1 each by Does not apply route 3 (three) times daily. 1 Device 0  . calcitRIOL (ROCALTROL) 0.25 MCG capsule Take 1 capsule (0.25 mcg total) by mouth daily. 30 capsule 6  . carvedilol (COREG) 6.25 MG tablet Take 2 tablets (12.5 mg total) by mouth 2 (two) times daily with a meal. 60 tablet 5  . furosemide (LASIX) 80 MG tablet Take 1 tablet (80 mg total) by mouth 2 (two) times daily. 60 tablet 3  . glucose blood (ACCU-CHEK AVIVA PLUS) test strip Use 3 times daily before meals and at bedtime. E11.9 100 each 12  . Lancet Devices (ACCU-CHEK SOFTCLIX) lancets Use as instructed 3 times daily before meals and at bedtime. E11.9 1 each 12  . Nutritional Supplements (FEEDING SUPPLEMENT, NEPRO CARB STEADY,) LIQD Take 237 mLs by mouth 2 (two) times daily between meals. 60 Can 0  . polyethylene glycol (MIRALAX / GLYCOLAX) packet Take 17 g by mouth daily. 14 each 0  . RENAGEL 800 MG tablet Take 800-1,600 mg by  mouth See admin instructions. Take 1600 mg by mouth 3 times daily with meals and take 800 mg by mouth twice daily with snacks    . amLODipine (NORVASC) 2.5 MG tablet Take 1 tablet (2.5 mg total) by mouth daily. 30 tablet 5  . atorvastatin (LIPITOR) 40 MG tablet Take 1 tablet (40 mg total) by mouth daily at 6 PM. 30 tablet 5  . gabapentin (NEURONTIN) 300 MG capsule Take 1 capsule (300 mg total) by mouth daily. 30 capsule 5  . insulin aspart (NOVOLOG) 100 UNIT/ML injection 0-12 units 3 times daily before meals as well as per sliding scale (Patient taking differently: Inject 0-12 Units into the skin 3 (three) times daily with meals. Per sliding scale) 10 mL 5  . insulin glargine (LANTUS) 100 UNIT/ML injection Inject subcutaneously 10 units of Lantus in the morning and 15 units of Lantus in the evening (Patient taking differently: Inject 15 Units into the skin at bedtime. ) 30 mL 5  . Darbepoetin Alfa (ARANESP) 150  MCG/0.3ML SOSY injection Inject 0.3 mLs (150 mcg total) into the vein every Saturday with hemodialysis. (Patient not taking: Reported on 12/15/2017) 1.68 mL 0  . hydrocortisone (ANUSOL-HC) 2.5 % rectal cream Place rectally 2 (two) times daily. (Patient not taking: Reported on 12/15/2017) 30 g 0  . ondansetron (ZOFRAN) 4 MG tablet Take 1 tablet (4 mg total) by mouth every 8 (eight) hours as needed for nausea or vomiting. (Patient not taking: Reported on 12/15/2017) 30 tablet 1  . oxyCODONE (OXY IR/ROXICODONE) 5 MG immediate release tablet Take 1 tablet (5 mg total) by mouth every 6 (six) hours as needed. (Patient not taking: Reported on 12/15/2017) 10 tablet 0   No facility-administered medications prior to visit.     ROS Review of Systems  Constitutional: Negative for activity change and appetite change.  HENT: Negative for sinus pressure and sore throat.   Eyes: Negative for visual disturbance.  Respiratory: Negative for cough, chest tightness and shortness of breath.   Cardiovascular:  Negative for chest pain and leg swelling.  Gastrointestinal: Negative for abdominal distention, abdominal pain, constipation and diarrhea.  Endocrine: Negative.   Genitourinary: Negative for dysuria.  Musculoskeletal: Negative for joint swelling and myalgias.  Skin: Negative for rash.  Allergic/Immunologic: Negative.   Neurological: Negative for weakness, light-headedness and numbness.  Psychiatric/Behavioral: Negative for dysphoric mood and suicidal ideas.    Objective:  BP (!) 147/75   Pulse (!) 53   Temp 97.6 F (36.4 C) (Oral)   Ht 5\' 11"  (1.803 m)   Wt 159 lb 3.2 oz (72.2 kg)   SpO2 98%   BMI 22.20 kg/m   BP/Weight 12/15/2017 09/15/2017 5/40/0867  Systolic BP 619 509 326  Diastolic BP 75 53 63  Wt. (Lbs) 159.2 158 154  BMI 22.2 22.04 21.48      Physical Exam  Constitutional: He is oriented to person, place, and time. He appears well-developed and well-nourished.  Cardiovascular: Normal heart sounds and intact distal pulses. Bradycardia present.  No murmur heard. Pulmonary/Chest: Effort normal and breath sounds normal. He has no wheezes. He has no rales. He exhibits no tenderness. Right breast exhibits mass (subareolar, not tender). Left breast exhibits mass (subareolar, not tender).  Abdominal: Soft. Bowel sounds are normal. He exhibits no distension and no mass. There is no tenderness.  Musculoskeletal: Normal range of motion.  Neurological: He is alert and oriented to person, place, and time.  Skin: Skin is warm and dry.  Psychiatric: He has a normal mood and affect.     Lab Results  Component Value Date   HGBA1C 6.6 12/15/2017    Assessment & Plan:   1. DM (diabetes mellitus) with complications (HCC) Significant improvement in A1c to 6.6 down from 8.1 Discussed hypoglycemia protocol as he is at risk given renal disease and he knows to reduce Lantus by 2 units in the morning and 2 units in the evening in the event that his sugars are running low Counseled on  Diabetic diet, my plate method, 712 minutes of moderate intensity exercise/week Keep blood sugar logs with fasting goals of 80-120 mg/dl, random of less than 180 and in the event of sugars less than 60 mg/dl or greater than 400 mg/dl please notify the clinic ASAP. It is recommended that you undergo annual eye exams and annual foot exams. Pneumonia vaccine is recommended. - POCT glucose (manual entry) - POCT glycosylated hemoglobin (Hb A1C) - insulin glargine (LANTUS) 100 UNIT/ML injection; Inject subcutaneously 10 units of Lantus in the morning and  15 units of Lantus in the evening  Dispense: 30 mL; Refill: 5 - insulin aspart (NOVOLOG) 100 UNIT/ML injection; 0-12 units 3 times daily before meals as well as per sliding scale  Dispense: 10 mL; Refill: 5  2. Pure hypercholesterolemia Stable Low cholesterol diet - atorvastatin (LIPITOR) 40 MG tablet; Take 1 tablet (40 mg total) by mouth daily at 6 PM.  Dispense: 30 tablet; Refill: 5  3. Diabetic mononeuropathy associated with diabetes mellitus due to underlying condition (HCC) Stable - gabapentin (NEURONTIN) 300 MG capsule; Take 1 capsule (300 mg total) by mouth daily.  Dispense: 30 capsule; Refill: 5  4. Gynecomastia Mammogram from 4/19 was negative for malignancy He currently does not take any inciting medications Patient reassured  5.  Essential hypertension Slightly elevated No regimen change as he could be prone to hypotension with dialysis  6.  ESRD Continue hemodialysis as per schedule  Case manager called in to see the patient up to encounter to discuss insurance needs, obtaining freestyle libre glucometer as requested and other social issues.  Meds ordered this encounter  Medications  . atorvastatin (LIPITOR) 40 MG tablet    Sig: Take 1 tablet (40 mg total) by mouth daily at 6 PM.    Dispense:  30 tablet    Refill:  5  . amLODipine (NORVASC) 2.5 MG tablet    Sig: Take 1 tablet (2.5 mg total) by mouth daily.    Dispense:   30 tablet    Refill:  5  . gabapentin (NEURONTIN) 300 MG capsule    Sig: Take 1 capsule (300 mg total) by mouth daily.    Dispense:  30 capsule    Refill:  5  . insulin glargine (LANTUS) 100 UNIT/ML injection    Sig: Inject subcutaneously 10 units of Lantus in the morning and 15 units of Lantus in the evening    Dispense:  30 mL    Refill:  5  . insulin aspart (NOVOLOG) 100 UNIT/ML injection    Sig: 0-12 units 3 times daily before meals as well as per sliding scale    Dispense:  10 mL    Refill:  5    As per sliding scale    Follow-up: Return in about 6 months (around 06/17/2018) for Follow-up on diabetes mellitus.   Charlott Rakes MD

## 2017-12-15 NOTE — Telephone Encounter (Signed)
Met with the patient and his wife when he was in the clinic today for his appointment. The expressed concerns again about the patient's loss of full medicaid. Informed them that they need to speak to the patient's DSS caseworker about medicaid questions.  They also explained that his food stamps were cut from$350/month to $15/month. Again, this CM encouraged them to speak to the DSS caseworker.  The patient completed that application for [employer] - nutrition support also provided them with a food box and information about local food pantries and free food market that is available monthly. Application faxed to [employer].   The patient is also inquiring if he is eligible for the [facility]. Informed him that this CM will check with the [facility] tech regarding coverage as this is a medicare part B item and [facility] pharmacy does not bill medicare part B.   The patient has [employer] manager # [id number]. Call placed to [facility] # [phone] and spoke to [place] who confirmed that this is a prescription policy only.  The patient is eligible for using [facility] prescription mail order delivery.  She also noted that the member receives the low income subsidy. Debbie also noted that this [facility] plan does not cover part B supplies like the [employer].   The patient currently receives dialysis T/Th/S at [facility] # [phone] and takes SCAT to his appointments. Dr Margarita Rana requested a record of immunizations that have been administered at dialysis. Call placed to [facility] and spoke to [place] who stated that she would fax an updated immunization record to this clinic.

## 2017-12-16 ENCOUNTER — Telehealth: Payer: Self-pay

## 2017-12-16 DIAGNOSIS — E1129 Type 2 diabetes mellitus with other diabetic kidney complication: Secondary | ICD-10-CM | POA: Diagnosis not present

## 2017-12-16 DIAGNOSIS — D509 Iron deficiency anemia, unspecified: Secondary | ICD-10-CM | POA: Diagnosis not present

## 2017-12-16 DIAGNOSIS — N186 End stage renal disease: Secondary | ICD-10-CM | POA: Diagnosis not present

## 2017-12-16 DIAGNOSIS — N2581 Secondary hyperparathyroidism of renal origin: Secondary | ICD-10-CM | POA: Diagnosis not present

## 2017-12-16 DIAGNOSIS — D631 Anemia in chronic kidney disease: Secondary | ICD-10-CM | POA: Diagnosis not present

## 2017-12-16 NOTE — Telephone Encounter (Signed)
As per Collier Flowers, [place], the patient's cost for the [facility] after insurance is [phone] for the first month for the sensor and reader and then $52/month after that for the sensors.   Call placed to the patient and informed him of the cost before an application would be submitted. and he said that was too much money for him.

## 2017-12-17 NOTE — Telephone Encounter (Signed)
Noted  

## 2017-12-18 DIAGNOSIS — D509 Iron deficiency anemia, unspecified: Secondary | ICD-10-CM | POA: Diagnosis not present

## 2017-12-18 DIAGNOSIS — N2581 Secondary hyperparathyroidism of renal origin: Secondary | ICD-10-CM | POA: Diagnosis not present

## 2017-12-18 DIAGNOSIS — Z23 Encounter for immunization: Secondary | ICD-10-CM | POA: Diagnosis not present

## 2017-12-18 DIAGNOSIS — Z992 Dependence on renal dialysis: Secondary | ICD-10-CM | POA: Diagnosis not present

## 2017-12-18 DIAGNOSIS — D631 Anemia in chronic kidney disease: Secondary | ICD-10-CM | POA: Diagnosis not present

## 2017-12-18 DIAGNOSIS — N186 End stage renal disease: Secondary | ICD-10-CM | POA: Diagnosis not present

## 2017-12-18 DIAGNOSIS — E1122 Type 2 diabetes mellitus with diabetic chronic kidney disease: Secondary | ICD-10-CM | POA: Diagnosis not present

## 2017-12-20 DIAGNOSIS — D509 Iron deficiency anemia, unspecified: Secondary | ICD-10-CM | POA: Diagnosis not present

## 2017-12-20 DIAGNOSIS — N186 End stage renal disease: Secondary | ICD-10-CM | POA: Diagnosis not present

## 2017-12-20 DIAGNOSIS — Z23 Encounter for immunization: Secondary | ICD-10-CM | POA: Diagnosis not present

## 2017-12-20 DIAGNOSIS — N2581 Secondary hyperparathyroidism of renal origin: Secondary | ICD-10-CM | POA: Diagnosis not present

## 2017-12-20 DIAGNOSIS — D631 Anemia in chronic kidney disease: Secondary | ICD-10-CM | POA: Diagnosis not present

## 2017-12-22 ENCOUNTER — Other Ambulatory Visit: Payer: Self-pay | Admitting: Family Medicine

## 2017-12-22 DIAGNOSIS — H35032 Hypertensive retinopathy, left eye: Secondary | ICD-10-CM | POA: Diagnosis not present

## 2017-12-22 DIAGNOSIS — H35072 Retinal telangiectasis, left eye: Secondary | ICD-10-CM | POA: Diagnosis not present

## 2017-12-22 DIAGNOSIS — H3582 Retinal ischemia: Secondary | ICD-10-CM | POA: Diagnosis not present

## 2017-12-22 DIAGNOSIS — E113512 Type 2 diabetes mellitus with proliferative diabetic retinopathy with macular edema, left eye: Secondary | ICD-10-CM | POA: Diagnosis not present

## 2017-12-22 MED FILL — FUROSEMIDE 80 MG TABS: 80 | 30 days supply | Qty: 60 | Fill #0

## 2017-12-23 DIAGNOSIS — N2581 Secondary hyperparathyroidism of renal origin: Secondary | ICD-10-CM | POA: Diagnosis not present

## 2017-12-23 DIAGNOSIS — D631 Anemia in chronic kidney disease: Secondary | ICD-10-CM | POA: Diagnosis not present

## 2017-12-23 DIAGNOSIS — Z23 Encounter for immunization: Secondary | ICD-10-CM | POA: Diagnosis not present

## 2017-12-23 DIAGNOSIS — N186 End stage renal disease: Secondary | ICD-10-CM | POA: Diagnosis not present

## 2017-12-23 DIAGNOSIS — D509 Iron deficiency anemia, unspecified: Secondary | ICD-10-CM | POA: Diagnosis not present

## 2017-12-25 DIAGNOSIS — N186 End stage renal disease: Secondary | ICD-10-CM | POA: Diagnosis not present

## 2017-12-25 DIAGNOSIS — D509 Iron deficiency anemia, unspecified: Secondary | ICD-10-CM | POA: Diagnosis not present

## 2017-12-25 DIAGNOSIS — D631 Anemia in chronic kidney disease: Secondary | ICD-10-CM | POA: Diagnosis not present

## 2017-12-25 DIAGNOSIS — Z23 Encounter for immunization: Secondary | ICD-10-CM | POA: Diagnosis not present

## 2017-12-25 DIAGNOSIS — N2581 Secondary hyperparathyroidism of renal origin: Secondary | ICD-10-CM | POA: Diagnosis not present

## 2017-12-27 DIAGNOSIS — N186 End stage renal disease: Secondary | ICD-10-CM | POA: Diagnosis not present

## 2017-12-27 DIAGNOSIS — D509 Iron deficiency anemia, unspecified: Secondary | ICD-10-CM | POA: Diagnosis not present

## 2017-12-27 DIAGNOSIS — N2581 Secondary hyperparathyroidism of renal origin: Secondary | ICD-10-CM | POA: Diagnosis not present

## 2017-12-27 DIAGNOSIS — Z23 Encounter for immunization: Secondary | ICD-10-CM | POA: Diagnosis not present

## 2017-12-27 DIAGNOSIS — D631 Anemia in chronic kidney disease: Secondary | ICD-10-CM | POA: Diagnosis not present

## 2017-12-30 DIAGNOSIS — Z23 Encounter for immunization: Secondary | ICD-10-CM | POA: Diagnosis not present

## 2017-12-30 DIAGNOSIS — D631 Anemia in chronic kidney disease: Secondary | ICD-10-CM | POA: Diagnosis not present

## 2017-12-30 DIAGNOSIS — N2581 Secondary hyperparathyroidism of renal origin: Secondary | ICD-10-CM | POA: Diagnosis not present

## 2017-12-30 DIAGNOSIS — D509 Iron deficiency anemia, unspecified: Secondary | ICD-10-CM | POA: Diagnosis not present

## 2017-12-30 DIAGNOSIS — N186 End stage renal disease: Secondary | ICD-10-CM | POA: Diagnosis not present

## 2018-01-01 DIAGNOSIS — D631 Anemia in chronic kidney disease: Secondary | ICD-10-CM | POA: Diagnosis not present

## 2018-01-01 DIAGNOSIS — Z23 Encounter for immunization: Secondary | ICD-10-CM | POA: Diagnosis not present

## 2018-01-01 DIAGNOSIS — N2581 Secondary hyperparathyroidism of renal origin: Secondary | ICD-10-CM | POA: Diagnosis not present

## 2018-01-01 DIAGNOSIS — N186 End stage renal disease: Secondary | ICD-10-CM | POA: Diagnosis not present

## 2018-01-01 DIAGNOSIS — D509 Iron deficiency anemia, unspecified: Secondary | ICD-10-CM | POA: Diagnosis not present

## 2018-01-03 DIAGNOSIS — D509 Iron deficiency anemia, unspecified: Secondary | ICD-10-CM | POA: Diagnosis not present

## 2018-01-03 DIAGNOSIS — N186 End stage renal disease: Secondary | ICD-10-CM | POA: Diagnosis not present

## 2018-01-03 DIAGNOSIS — Z23 Encounter for immunization: Secondary | ICD-10-CM | POA: Diagnosis not present

## 2018-01-03 DIAGNOSIS — D631 Anemia in chronic kidney disease: Secondary | ICD-10-CM | POA: Diagnosis not present

## 2018-01-03 DIAGNOSIS — N2581 Secondary hyperparathyroidism of renal origin: Secondary | ICD-10-CM | POA: Diagnosis not present

## 2018-01-05 DIAGNOSIS — Z992 Dependence on renal dialysis: Secondary | ICD-10-CM | POA: Diagnosis not present

## 2018-01-05 DIAGNOSIS — T82868A Thrombosis of vascular prosthetic devices, implants and grafts, initial encounter: Secondary | ICD-10-CM | POA: Diagnosis not present

## 2018-01-05 DIAGNOSIS — I871 Compression of vein: Secondary | ICD-10-CM | POA: Diagnosis not present

## 2018-01-05 DIAGNOSIS — N186 End stage renal disease: Secondary | ICD-10-CM | POA: Diagnosis not present

## 2018-01-06 DIAGNOSIS — N2581 Secondary hyperparathyroidism of renal origin: Secondary | ICD-10-CM | POA: Diagnosis not present

## 2018-01-06 DIAGNOSIS — D631 Anemia in chronic kidney disease: Secondary | ICD-10-CM | POA: Diagnosis not present

## 2018-01-06 DIAGNOSIS — D509 Iron deficiency anemia, unspecified: Secondary | ICD-10-CM | POA: Diagnosis not present

## 2018-01-06 DIAGNOSIS — Z23 Encounter for immunization: Secondary | ICD-10-CM | POA: Diagnosis not present

## 2018-01-06 DIAGNOSIS — N186 End stage renal disease: Secondary | ICD-10-CM | POA: Diagnosis not present

## 2018-01-08 DIAGNOSIS — D509 Iron deficiency anemia, unspecified: Secondary | ICD-10-CM | POA: Diagnosis not present

## 2018-01-08 DIAGNOSIS — D631 Anemia in chronic kidney disease: Secondary | ICD-10-CM | POA: Diagnosis not present

## 2018-01-08 DIAGNOSIS — N186 End stage renal disease: Secondary | ICD-10-CM | POA: Diagnosis not present

## 2018-01-08 DIAGNOSIS — Z23 Encounter for immunization: Secondary | ICD-10-CM | POA: Diagnosis not present

## 2018-01-08 DIAGNOSIS — N2581 Secondary hyperparathyroidism of renal origin: Secondary | ICD-10-CM | POA: Diagnosis not present

## 2018-01-10 DIAGNOSIS — D631 Anemia in chronic kidney disease: Secondary | ICD-10-CM | POA: Diagnosis not present

## 2018-01-10 DIAGNOSIS — Z23 Encounter for immunization: Secondary | ICD-10-CM | POA: Diagnosis not present

## 2018-01-10 DIAGNOSIS — N2581 Secondary hyperparathyroidism of renal origin: Secondary | ICD-10-CM | POA: Diagnosis not present

## 2018-01-10 DIAGNOSIS — D509 Iron deficiency anemia, unspecified: Secondary | ICD-10-CM | POA: Diagnosis not present

## 2018-01-10 DIAGNOSIS — N186 End stage renal disease: Secondary | ICD-10-CM | POA: Diagnosis not present

## 2018-01-12 DIAGNOSIS — E113512 Type 2 diabetes mellitus with proliferative diabetic retinopathy with macular edema, left eye: Secondary | ICD-10-CM | POA: Diagnosis not present

## 2018-01-13 DIAGNOSIS — D509 Iron deficiency anemia, unspecified: Secondary | ICD-10-CM | POA: Diagnosis not present

## 2018-01-13 DIAGNOSIS — N186 End stage renal disease: Secondary | ICD-10-CM | POA: Diagnosis not present

## 2018-01-13 DIAGNOSIS — N2581 Secondary hyperparathyroidism of renal origin: Secondary | ICD-10-CM | POA: Diagnosis not present

## 2018-01-13 DIAGNOSIS — D631 Anemia in chronic kidney disease: Secondary | ICD-10-CM | POA: Diagnosis not present

## 2018-01-13 DIAGNOSIS — Z23 Encounter for immunization: Secondary | ICD-10-CM | POA: Diagnosis not present

## 2018-01-15 DIAGNOSIS — N2581 Secondary hyperparathyroidism of renal origin: Secondary | ICD-10-CM | POA: Diagnosis not present

## 2018-01-15 DIAGNOSIS — D631 Anemia in chronic kidney disease: Secondary | ICD-10-CM | POA: Diagnosis not present

## 2018-01-15 DIAGNOSIS — Z23 Encounter for immunization: Secondary | ICD-10-CM | POA: Diagnosis not present

## 2018-01-15 DIAGNOSIS — N186 End stage renal disease: Secondary | ICD-10-CM | POA: Diagnosis not present

## 2018-01-15 DIAGNOSIS — D509 Iron deficiency anemia, unspecified: Secondary | ICD-10-CM | POA: Diagnosis not present

## 2018-01-16 ENCOUNTER — Other Ambulatory Visit: Payer: Self-pay | Admitting: Family Medicine

## 2018-01-16 DIAGNOSIS — I10 Essential (primary) hypertension: Secondary | ICD-10-CM

## 2018-01-16 MED FILL — LANTUS 100 UNITS/ML VIAL: 100 | 28 days supply | Qty: 10 | Fill #4

## 2018-01-16 MED FILL — NovoLOG 100 UNIT/ML SOLN: 100 | 27 days supply | Qty: 10 | Fill #5

## 2018-01-16 MED FILL — GABAPENTIN 300 MG CAPSULE: 300 | 30 days supply | Qty: 30 | Fill #0

## 2018-01-16 MED FILL — FUROSEMIDE 80 MG TABS: 80 | 30 days supply | Qty: 60 | Fill #1

## 2018-01-16 MED FILL — AMLODIPINE 2.5 MG TABLET: 2.5 | 30 days supply | Qty: 30 | Fill #1

## 2018-01-16 MED FILL — CARVEDILOL 6.25 MG TABLET: 6.25 | 30 days supply | Qty: 120 | Fill #0

## 2018-01-16 MED FILL — ATORVASTATIN CALCIUM 40 MG: 40 | 30 days supply | Qty: 30 | Fill #3

## 2018-01-17 DIAGNOSIS — D509 Iron deficiency anemia, unspecified: Secondary | ICD-10-CM | POA: Diagnosis not present

## 2018-01-17 DIAGNOSIS — N2581 Secondary hyperparathyroidism of renal origin: Secondary | ICD-10-CM | POA: Diagnosis not present

## 2018-01-17 DIAGNOSIS — Z23 Encounter for immunization: Secondary | ICD-10-CM | POA: Diagnosis not present

## 2018-01-17 DIAGNOSIS — D631 Anemia in chronic kidney disease: Secondary | ICD-10-CM | POA: Diagnosis not present

## 2018-01-17 DIAGNOSIS — N186 End stage renal disease: Secondary | ICD-10-CM | POA: Diagnosis not present

## 2018-01-18 DIAGNOSIS — E1122 Type 2 diabetes mellitus with diabetic chronic kidney disease: Secondary | ICD-10-CM | POA: Diagnosis not present

## 2018-01-18 DIAGNOSIS — Z992 Dependence on renal dialysis: Secondary | ICD-10-CM | POA: Diagnosis not present

## 2018-01-18 DIAGNOSIS — N186 End stage renal disease: Secondary | ICD-10-CM | POA: Diagnosis not present

## 2018-01-20 DIAGNOSIS — D631 Anemia in chronic kidney disease: Secondary | ICD-10-CM | POA: Diagnosis not present

## 2018-01-20 DIAGNOSIS — N186 End stage renal disease: Secondary | ICD-10-CM | POA: Diagnosis not present

## 2018-01-20 DIAGNOSIS — Z23 Encounter for immunization: Secondary | ICD-10-CM | POA: Diagnosis not present

## 2018-01-20 DIAGNOSIS — N2581 Secondary hyperparathyroidism of renal origin: Secondary | ICD-10-CM | POA: Diagnosis not present

## 2018-01-20 DIAGNOSIS — D509 Iron deficiency anemia, unspecified: Secondary | ICD-10-CM | POA: Diagnosis not present

## 2018-01-22 DIAGNOSIS — N2581 Secondary hyperparathyroidism of renal origin: Secondary | ICD-10-CM | POA: Diagnosis not present

## 2018-01-22 DIAGNOSIS — Z23 Encounter for immunization: Secondary | ICD-10-CM | POA: Diagnosis not present

## 2018-01-22 DIAGNOSIS — D631 Anemia in chronic kidney disease: Secondary | ICD-10-CM | POA: Diagnosis not present

## 2018-01-22 DIAGNOSIS — N186 End stage renal disease: Secondary | ICD-10-CM | POA: Diagnosis not present

## 2018-01-22 DIAGNOSIS — D509 Iron deficiency anemia, unspecified: Secondary | ICD-10-CM | POA: Diagnosis not present

## 2018-01-24 DIAGNOSIS — D509 Iron deficiency anemia, unspecified: Secondary | ICD-10-CM | POA: Diagnosis not present

## 2018-01-24 DIAGNOSIS — N186 End stage renal disease: Secondary | ICD-10-CM | POA: Diagnosis not present

## 2018-01-24 DIAGNOSIS — Z23 Encounter for immunization: Secondary | ICD-10-CM | POA: Diagnosis not present

## 2018-01-24 DIAGNOSIS — N2581 Secondary hyperparathyroidism of renal origin: Secondary | ICD-10-CM | POA: Diagnosis not present

## 2018-01-24 DIAGNOSIS — D631 Anemia in chronic kidney disease: Secondary | ICD-10-CM | POA: Diagnosis not present

## 2018-01-27 DIAGNOSIS — N186 End stage renal disease: Secondary | ICD-10-CM | POA: Diagnosis not present

## 2018-01-27 DIAGNOSIS — E113512 Type 2 diabetes mellitus with proliferative diabetic retinopathy with macular edema, left eye: Secondary | ICD-10-CM | POA: Diagnosis not present

## 2018-01-27 DIAGNOSIS — D509 Iron deficiency anemia, unspecified: Secondary | ICD-10-CM | POA: Diagnosis not present

## 2018-01-27 DIAGNOSIS — N2581 Secondary hyperparathyroidism of renal origin: Secondary | ICD-10-CM | POA: Diagnosis not present

## 2018-01-27 DIAGNOSIS — Z23 Encounter for immunization: Secondary | ICD-10-CM | POA: Diagnosis not present

## 2018-01-27 DIAGNOSIS — D631 Anemia in chronic kidney disease: Secondary | ICD-10-CM | POA: Diagnosis not present

## 2018-01-29 DIAGNOSIS — D631 Anemia in chronic kidney disease: Secondary | ICD-10-CM | POA: Diagnosis not present

## 2018-01-29 DIAGNOSIS — D509 Iron deficiency anemia, unspecified: Secondary | ICD-10-CM | POA: Diagnosis not present

## 2018-01-29 DIAGNOSIS — N2581 Secondary hyperparathyroidism of renal origin: Secondary | ICD-10-CM | POA: Diagnosis not present

## 2018-01-29 DIAGNOSIS — Z23 Encounter for immunization: Secondary | ICD-10-CM | POA: Diagnosis not present

## 2018-01-29 DIAGNOSIS — N186 End stage renal disease: Secondary | ICD-10-CM | POA: Diagnosis not present

## 2018-01-31 DIAGNOSIS — D509 Iron deficiency anemia, unspecified: Secondary | ICD-10-CM | POA: Diagnosis not present

## 2018-01-31 DIAGNOSIS — Z23 Encounter for immunization: Secondary | ICD-10-CM | POA: Diagnosis not present

## 2018-01-31 DIAGNOSIS — N186 End stage renal disease: Secondary | ICD-10-CM | POA: Diagnosis not present

## 2018-01-31 DIAGNOSIS — N2581 Secondary hyperparathyroidism of renal origin: Secondary | ICD-10-CM | POA: Diagnosis not present

## 2018-01-31 DIAGNOSIS — D631 Anemia in chronic kidney disease: Secondary | ICD-10-CM | POA: Diagnosis not present

## 2018-02-03 DIAGNOSIS — N2581 Secondary hyperparathyroidism of renal origin: Secondary | ICD-10-CM | POA: Diagnosis not present

## 2018-02-03 DIAGNOSIS — Z23 Encounter for immunization: Secondary | ICD-10-CM | POA: Diagnosis not present

## 2018-02-03 DIAGNOSIS — N186 End stage renal disease: Secondary | ICD-10-CM | POA: Diagnosis not present

## 2018-02-03 DIAGNOSIS — D631 Anemia in chronic kidney disease: Secondary | ICD-10-CM | POA: Diagnosis not present

## 2018-02-03 DIAGNOSIS — D509 Iron deficiency anemia, unspecified: Secondary | ICD-10-CM | POA: Diagnosis not present

## 2018-02-05 DIAGNOSIS — D509 Iron deficiency anemia, unspecified: Secondary | ICD-10-CM | POA: Diagnosis not present

## 2018-02-05 DIAGNOSIS — N2581 Secondary hyperparathyroidism of renal origin: Secondary | ICD-10-CM | POA: Diagnosis not present

## 2018-02-05 DIAGNOSIS — Z23 Encounter for immunization: Secondary | ICD-10-CM | POA: Diagnosis not present

## 2018-02-05 DIAGNOSIS — D631 Anemia in chronic kidney disease: Secondary | ICD-10-CM | POA: Diagnosis not present

## 2018-02-05 DIAGNOSIS — N186 End stage renal disease: Secondary | ICD-10-CM | POA: Diagnosis not present

## 2018-02-07 DIAGNOSIS — N186 End stage renal disease: Secondary | ICD-10-CM | POA: Diagnosis not present

## 2018-02-07 DIAGNOSIS — Z23 Encounter for immunization: Secondary | ICD-10-CM | POA: Diagnosis not present

## 2018-02-07 DIAGNOSIS — D631 Anemia in chronic kidney disease: Secondary | ICD-10-CM | POA: Diagnosis not present

## 2018-02-07 DIAGNOSIS — N2581 Secondary hyperparathyroidism of renal origin: Secondary | ICD-10-CM | POA: Diagnosis not present

## 2018-02-07 DIAGNOSIS — D509 Iron deficiency anemia, unspecified: Secondary | ICD-10-CM | POA: Diagnosis not present

## 2018-02-09 DIAGNOSIS — E113512 Type 2 diabetes mellitus with proliferative diabetic retinopathy with macular edema, left eye: Secondary | ICD-10-CM | POA: Diagnosis not present

## 2018-02-10 DIAGNOSIS — D509 Iron deficiency anemia, unspecified: Secondary | ICD-10-CM | POA: Diagnosis not present

## 2018-02-10 DIAGNOSIS — N186 End stage renal disease: Secondary | ICD-10-CM | POA: Diagnosis not present

## 2018-02-10 DIAGNOSIS — D631 Anemia in chronic kidney disease: Secondary | ICD-10-CM | POA: Diagnosis not present

## 2018-02-10 DIAGNOSIS — N2581 Secondary hyperparathyroidism of renal origin: Secondary | ICD-10-CM | POA: Diagnosis not present

## 2018-02-10 DIAGNOSIS — Z23 Encounter for immunization: Secondary | ICD-10-CM | POA: Diagnosis not present

## 2018-02-12 DIAGNOSIS — Z23 Encounter for immunization: Secondary | ICD-10-CM | POA: Diagnosis not present

## 2018-02-12 DIAGNOSIS — N186 End stage renal disease: Secondary | ICD-10-CM | POA: Diagnosis not present

## 2018-02-12 DIAGNOSIS — D509 Iron deficiency anemia, unspecified: Secondary | ICD-10-CM | POA: Diagnosis not present

## 2018-02-12 DIAGNOSIS — D631 Anemia in chronic kidney disease: Secondary | ICD-10-CM | POA: Diagnosis not present

## 2018-02-12 DIAGNOSIS — N2581 Secondary hyperparathyroidism of renal origin: Secondary | ICD-10-CM | POA: Diagnosis not present

## 2018-02-14 DIAGNOSIS — D509 Iron deficiency anemia, unspecified: Secondary | ICD-10-CM | POA: Diagnosis not present

## 2018-02-14 DIAGNOSIS — N2581 Secondary hyperparathyroidism of renal origin: Secondary | ICD-10-CM | POA: Diagnosis not present

## 2018-02-14 DIAGNOSIS — Z23 Encounter for immunization: Secondary | ICD-10-CM | POA: Diagnosis not present

## 2018-02-14 DIAGNOSIS — D631 Anemia in chronic kidney disease: Secondary | ICD-10-CM | POA: Diagnosis not present

## 2018-02-14 DIAGNOSIS — N186 End stage renal disease: Secondary | ICD-10-CM | POA: Diagnosis not present

## 2018-02-16 ENCOUNTER — Other Ambulatory Visit: Payer: Self-pay | Admitting: Family Medicine

## 2018-02-16 DIAGNOSIS — I10 Essential (primary) hypertension: Secondary | ICD-10-CM

## 2018-02-16 MED FILL — ATORVASTATIN CALCIUM 40 MG: 40 | 30 days supply | Qty: 30 | Fill #4

## 2018-02-16 MED FILL — GABAPENTIN 300 MG CAPSULE: 300 | 30 days supply | Qty: 30 | Fill #1

## 2018-02-16 MED FILL — CARVEDILOL 6.25 MG TABLET: 6.25 | 15 days supply | Qty: 60 | Fill #1

## 2018-02-16 MED FILL — AMLODIPINE 2.5 MG TABLET: 2.5 | 30 days supply | Qty: 30 | Fill #2

## 2018-02-17 DIAGNOSIS — Z23 Encounter for immunization: Secondary | ICD-10-CM | POA: Diagnosis not present

## 2018-02-17 DIAGNOSIS — N186 End stage renal disease: Secondary | ICD-10-CM | POA: Diagnosis not present

## 2018-02-17 DIAGNOSIS — N2581 Secondary hyperparathyroidism of renal origin: Secondary | ICD-10-CM | POA: Diagnosis not present

## 2018-02-17 DIAGNOSIS — D509 Iron deficiency anemia, unspecified: Secondary | ICD-10-CM | POA: Diagnosis not present

## 2018-02-17 DIAGNOSIS — E1122 Type 2 diabetes mellitus with diabetic chronic kidney disease: Secondary | ICD-10-CM | POA: Diagnosis not present

## 2018-02-17 DIAGNOSIS — Z992 Dependence on renal dialysis: Secondary | ICD-10-CM | POA: Diagnosis not present

## 2018-02-19 DIAGNOSIS — E1129 Type 2 diabetes mellitus with other diabetic kidney complication: Secondary | ICD-10-CM | POA: Diagnosis not present

## 2018-02-19 DIAGNOSIS — D509 Iron deficiency anemia, unspecified: Secondary | ICD-10-CM | POA: Diagnosis not present

## 2018-02-19 DIAGNOSIS — N2581 Secondary hyperparathyroidism of renal origin: Secondary | ICD-10-CM | POA: Diagnosis not present

## 2018-02-19 DIAGNOSIS — N186 End stage renal disease: Secondary | ICD-10-CM | POA: Diagnosis not present

## 2018-02-19 DIAGNOSIS — Z23 Encounter for immunization: Secondary | ICD-10-CM | POA: Diagnosis not present

## 2018-02-21 DIAGNOSIS — N2581 Secondary hyperparathyroidism of renal origin: Secondary | ICD-10-CM | POA: Diagnosis not present

## 2018-02-21 DIAGNOSIS — Z23 Encounter for immunization: Secondary | ICD-10-CM | POA: Diagnosis not present

## 2018-02-21 DIAGNOSIS — N186 End stage renal disease: Secondary | ICD-10-CM | POA: Diagnosis not present

## 2018-02-21 DIAGNOSIS — D509 Iron deficiency anemia, unspecified: Secondary | ICD-10-CM | POA: Diagnosis not present

## 2018-02-23 NOTE — Telephone Encounter (Signed)
error 

## 2018-02-24 DIAGNOSIS — Z23 Encounter for immunization: Secondary | ICD-10-CM | POA: Diagnosis not present

## 2018-02-24 DIAGNOSIS — N186 End stage renal disease: Secondary | ICD-10-CM | POA: Diagnosis not present

## 2018-02-24 DIAGNOSIS — D509 Iron deficiency anemia, unspecified: Secondary | ICD-10-CM | POA: Diagnosis not present

## 2018-02-24 DIAGNOSIS — N2581 Secondary hyperparathyroidism of renal origin: Secondary | ICD-10-CM | POA: Diagnosis not present

## 2018-02-26 DIAGNOSIS — N186 End stage renal disease: Secondary | ICD-10-CM | POA: Diagnosis not present

## 2018-02-26 DIAGNOSIS — Z23 Encounter for immunization: Secondary | ICD-10-CM | POA: Diagnosis not present

## 2018-02-26 DIAGNOSIS — N2581 Secondary hyperparathyroidism of renal origin: Secondary | ICD-10-CM | POA: Diagnosis not present

## 2018-02-26 DIAGNOSIS — D509 Iron deficiency anemia, unspecified: Secondary | ICD-10-CM | POA: Diagnosis not present

## 2018-02-28 DIAGNOSIS — Z23 Encounter for immunization: Secondary | ICD-10-CM | POA: Diagnosis not present

## 2018-02-28 DIAGNOSIS — D509 Iron deficiency anemia, unspecified: Secondary | ICD-10-CM | POA: Diagnosis not present

## 2018-02-28 DIAGNOSIS — N2581 Secondary hyperparathyroidism of renal origin: Secondary | ICD-10-CM | POA: Diagnosis not present

## 2018-02-28 DIAGNOSIS — N186 End stage renal disease: Secondary | ICD-10-CM | POA: Diagnosis not present

## 2018-03-03 DIAGNOSIS — D509 Iron deficiency anemia, unspecified: Secondary | ICD-10-CM | POA: Diagnosis not present

## 2018-03-03 DIAGNOSIS — N186 End stage renal disease: Secondary | ICD-10-CM | POA: Diagnosis not present

## 2018-03-03 DIAGNOSIS — Z23 Encounter for immunization: Secondary | ICD-10-CM | POA: Diagnosis not present

## 2018-03-03 DIAGNOSIS — N2581 Secondary hyperparathyroidism of renal origin: Secondary | ICD-10-CM | POA: Diagnosis not present

## 2018-03-05 DIAGNOSIS — N2581 Secondary hyperparathyroidism of renal origin: Secondary | ICD-10-CM | POA: Diagnosis not present

## 2018-03-05 DIAGNOSIS — D509 Iron deficiency anemia, unspecified: Secondary | ICD-10-CM | POA: Diagnosis not present

## 2018-03-05 DIAGNOSIS — N186 End stage renal disease: Secondary | ICD-10-CM | POA: Diagnosis not present

## 2018-03-05 DIAGNOSIS — Z23 Encounter for immunization: Secondary | ICD-10-CM | POA: Diagnosis not present

## 2018-03-07 DIAGNOSIS — D509 Iron deficiency anemia, unspecified: Secondary | ICD-10-CM | POA: Diagnosis not present

## 2018-03-07 DIAGNOSIS — Z23 Encounter for immunization: Secondary | ICD-10-CM | POA: Diagnosis not present

## 2018-03-07 DIAGNOSIS — N2581 Secondary hyperparathyroidism of renal origin: Secondary | ICD-10-CM | POA: Diagnosis not present

## 2018-03-07 DIAGNOSIS — N186 End stage renal disease: Secondary | ICD-10-CM | POA: Diagnosis not present

## 2018-03-10 DIAGNOSIS — D509 Iron deficiency anemia, unspecified: Secondary | ICD-10-CM | POA: Diagnosis not present

## 2018-03-10 DIAGNOSIS — Z23 Encounter for immunization: Secondary | ICD-10-CM | POA: Diagnosis not present

## 2018-03-10 DIAGNOSIS — N186 End stage renal disease: Secondary | ICD-10-CM | POA: Diagnosis not present

## 2018-03-10 DIAGNOSIS — N2581 Secondary hyperparathyroidism of renal origin: Secondary | ICD-10-CM | POA: Diagnosis not present

## 2018-03-11 MED FILL — AMLODIPINE 2.5 MG TABLET: 2.5 | 30 days supply | Qty: 30 | Fill #3

## 2018-03-11 MED FILL — ATORVASTATIN CALCIUM 40 MG: 40 | 30 days supply | Qty: 30 | Fill #5

## 2018-03-11 MED FILL — FUROSEMIDE 80 MG TABS: 80 | 30 days supply | Qty: 60 | Fill #2

## 2018-03-11 MED FILL — CARVEDILOL 6.25 MG TABLET: 6.25 | 15 days supply | Qty: 60 | Fill #0

## 2018-03-11 MED FILL — GABAPENTIN 300 MG CAPSULE: 300 | 30 days supply | Qty: 30 | Fill #2

## 2018-03-12 ENCOUNTER — Inpatient Hospital Stay (HOSPITAL_COMMUNITY)
Admission: EM | Admit: 2018-03-12 | Discharge: 2018-03-21 | DRG: 871 | Disposition: A | Discharge status: Skilled Nursing Facility | Payer: Medicare Other | Attending: Internal Medicine | Admitting: Internal Medicine

## 2018-03-12 ENCOUNTER — Other Ambulatory Visit: Payer: Self-pay

## 2018-03-12 ENCOUNTER — Encounter (HOSPITAL_COMMUNITY): Payer: Self-pay

## 2018-03-12 DIAGNOSIS — R7881 Bacteremia: Secondary | ICD-10-CM | POA: Diagnosis not present

## 2018-03-12 DIAGNOSIS — B954 Other streptococcus as the cause of diseases classified elsewhere: Secondary | ICD-10-CM | POA: Diagnosis present

## 2018-03-12 DIAGNOSIS — R509 Fever, unspecified: Secondary | ICD-10-CM | POA: Diagnosis not present

## 2018-03-12 DIAGNOSIS — I120 Hypertensive chronic kidney disease with stage 5 chronic kidney disease or end stage renal disease: Secondary | ICD-10-CM | POA: Diagnosis present

## 2018-03-12 DIAGNOSIS — D631 Anemia in chronic kidney disease: Secondary | ICD-10-CM | POA: Diagnosis present

## 2018-03-12 DIAGNOSIS — R5381 Other malaise: Secondary | ICD-10-CM | POA: Diagnosis present

## 2018-03-12 DIAGNOSIS — R300 Dysuria: Secondary | ICD-10-CM | POA: Diagnosis not present

## 2018-03-12 DIAGNOSIS — I350 Nonrheumatic aortic (valve) stenosis: Secondary | ICD-10-CM | POA: Diagnosis present

## 2018-03-12 DIAGNOSIS — Z7982 Long term (current) use of aspirin: Secondary | ICD-10-CM

## 2018-03-12 DIAGNOSIS — H409 Unspecified glaucoma: Secondary | ICD-10-CM | POA: Diagnosis present

## 2018-03-12 DIAGNOSIS — D509 Iron deficiency anemia, unspecified: Secondary | ICD-10-CM | POA: Diagnosis not present

## 2018-03-12 DIAGNOSIS — E876 Hypokalemia: Secondary | ICD-10-CM | POA: Diagnosis present

## 2018-03-12 DIAGNOSIS — Z992 Dependence on renal dialysis: Secondary | ICD-10-CM

## 2018-03-12 DIAGNOSIS — M545 Low back pain, unspecified: Secondary | ICD-10-CM

## 2018-03-12 DIAGNOSIS — B955 Unspecified streptococcus as the cause of diseases classified elsewhere: Secondary | ICD-10-CM | POA: Diagnosis present

## 2018-03-12 DIAGNOSIS — E1140 Type 2 diabetes mellitus with diabetic neuropathy, unspecified: Secondary | ICD-10-CM | POA: Diagnosis present

## 2018-03-12 DIAGNOSIS — I10 Essential (primary) hypertension: Secondary | ICD-10-CM | POA: Diagnosis present

## 2018-03-12 DIAGNOSIS — R195 Other fecal abnormalities: Secondary | ICD-10-CM | POA: Diagnosis not present

## 2018-03-12 DIAGNOSIS — N186 End stage renal disease: Secondary | ICD-10-CM | POA: Diagnosis present

## 2018-03-12 DIAGNOSIS — I2510 Atherosclerotic heart disease of native coronary artery without angina pectoris: Secondary | ICD-10-CM | POA: Diagnosis present

## 2018-03-12 DIAGNOSIS — R161 Splenomegaly, not elsewhere classified: Secondary | ICD-10-CM | POA: Diagnosis not present

## 2018-03-12 DIAGNOSIS — M4640 Discitis, unspecified, site unspecified: Secondary | ICD-10-CM | POA: Diagnosis present

## 2018-03-12 DIAGNOSIS — R3911 Hesitancy of micturition: Secondary | ICD-10-CM | POA: Diagnosis not present

## 2018-03-12 DIAGNOSIS — R29898 Other symptoms and signs involving the musculoskeletal system: Secondary | ICD-10-CM | POA: Diagnosis present

## 2018-03-12 DIAGNOSIS — N2581 Secondary hyperparathyroidism of renal origin: Secondary | ICD-10-CM | POA: Diagnosis present

## 2018-03-12 DIAGNOSIS — M4807 Spinal stenosis, lumbosacral region: Secondary | ICD-10-CM | POA: Diagnosis present

## 2018-03-12 DIAGNOSIS — Z79899 Other long term (current) drug therapy: Secondary | ICD-10-CM

## 2018-03-12 DIAGNOSIS — Z23 Encounter for immunization: Secondary | ICD-10-CM | POA: Diagnosis not present

## 2018-03-12 DIAGNOSIS — E871 Hypo-osmolality and hyponatremia: Secondary | ICD-10-CM | POA: Diagnosis present

## 2018-03-12 DIAGNOSIS — Z794 Long term (current) use of insulin: Secondary | ICD-10-CM

## 2018-03-12 DIAGNOSIS — E118 Type 2 diabetes mellitus with unspecified complications: Secondary | ICD-10-CM | POA: Diagnosis present

## 2018-03-12 DIAGNOSIS — Z87891 Personal history of nicotine dependence: Secondary | ICD-10-CM

## 2018-03-12 DIAGNOSIS — Z833 Family history of diabetes mellitus: Secondary | ICD-10-CM

## 2018-03-12 DIAGNOSIS — K5909 Other constipation: Secondary | ICD-10-CM | POA: Diagnosis present

## 2018-03-12 DIAGNOSIS — M549 Dorsalgia, unspecified: Secondary | ICD-10-CM

## 2018-03-12 DIAGNOSIS — E785 Hyperlipidemia, unspecified: Secondary | ICD-10-CM | POA: Diagnosis present

## 2018-03-12 DIAGNOSIS — G8929 Other chronic pain: Secondary | ICD-10-CM | POA: Diagnosis present

## 2018-03-12 DIAGNOSIS — M48061 Spinal stenosis, lumbar region without neurogenic claudication: Secondary | ICD-10-CM | POA: Diagnosis present

## 2018-03-12 DIAGNOSIS — E1122 Type 2 diabetes mellitus with diabetic chronic kidney disease: Secondary | ICD-10-CM | POA: Diagnosis present

## 2018-03-12 DIAGNOSIS — H5440 Blindness, one eye, unspecified eye: Secondary | ICD-10-CM | POA: Diagnosis present

## 2018-03-12 DIAGNOSIS — E1165 Type 2 diabetes mellitus with hyperglycemia: Secondary | ICD-10-CM | POA: Diagnosis present

## 2018-03-12 DIAGNOSIS — H5461 Unqualified visual loss, right eye, normal vision left eye: Secondary | ICD-10-CM | POA: Diagnosis present

## 2018-03-12 DIAGNOSIS — E8770 Fluid overload, unspecified: Secondary | ICD-10-CM | POA: Diagnosis present

## 2018-03-12 HISTORY — DX: Dorsalgia, unspecified: M54.9

## 2018-03-12 HISTORY — DX: Fever, unspecified: R50.9

## 2018-03-12 LAB — CBC
HCT: 32.6 % — ABNORMAL LOW (ref 39.0–52.0)
HEMOGLOBIN: 10.7 g/dL — AB (ref 13.0–17.0)
MCH: 30.2 pg (ref 26.0–34.0)
MCHC: 32.8 g/dL (ref 30.0–36.0)
MCV: 92.1 fL (ref 80.0–100.0)
NRBC: 0 % (ref 0.0–0.2)
Platelets: 95 10*3/uL — ABNORMAL LOW (ref 150–400)
RBC: 3.54 MIL/uL — AB (ref 4.22–5.81)
RDW: 13.9 % (ref 11.5–15.5)
WBC: 10.4 10*3/uL (ref 4.0–10.5)

## 2018-03-12 LAB — BASIC METABOLIC PANEL
ANION GAP: 17 — AB (ref 5–15)
BUN: 22 mg/dL — AB (ref 6–20)
CHLORIDE: 93 mmol/L — AB (ref 98–111)
CO2: 25 mmol/L (ref 22–32)
Calcium: 9 mg/dL (ref 8.9–10.3)
Creatinine, Ser: 3.85 mg/dL — ABNORMAL HIGH (ref 0.61–1.24)
GFR calc Af Amer: 18 mL/min — ABNORMAL LOW (ref >60)
GFR, EST NON AFRICAN AMERICAN: 16 mL/min — AB (ref >60)
Glucose, Bld: 228 mg/dL — ABNORMAL HIGH (ref 70–99)
POTASSIUM: 3.4 mmol/L — AB (ref 3.5–5.1)
SODIUM: 135 mmol/L (ref 135–145)

## 2018-03-12 MED ORDER — ACETAMINOPHEN 325 MG PO TABS
650.0000 mg | ORAL_TABLET | Freq: Once | ORAL | Status: AC
  Administered 2018-03-12: 650 mg via ORAL
  Filled 2018-03-12: qty 2

## 2018-03-12 MED FILL — NovoLOG 100 UNIT/ML SOLN: 100 | 27 days supply | Qty: 10 | Fill #0

## 2018-03-12 MED FILL — LANTUS 100 UNITS/ML VIAL: 100 | 28 days supply | Qty: 10 | Fill #5

## 2018-03-12 NOTE — ED Triage Notes (Signed)
Pt endorses lower back pain that started yesterday.  Hx of dialysis and gets treatments tue/thurs/sat.  Received full treatment today. A&Ox4

## 2018-03-12 NOTE — ED Provider Notes (Signed)
Patient placed in Quick Look pathway, seen and evaluated   Chief Complaint: back pain  HPI: Russell Price is a 57 y.o. male with hx of DM, sepsis, HTN, dialysis who presents to the ED with low back pain. The pain started yesterday. Patient reports that before yesterday he was walking normally but today having difficulty walking due to the pain. Patient reports the pain radiates to both legs.   ROS: M/S: back pain  Physical Exam: BP (!) 137/54 (BP Location: Right Arm)   Pulse 86   Temp (!) 100.7 F (38.2 C) (Oral)   Resp 19   SpO2 100%    Gen: No distress, low grade fever  Neuro: Awake and Alert  Skin: Warm and dry  Back: tender lumbar area   Initiation of care has begun. The patient has been counseled on the process, plan, and necessity for staying for the completion/evaluation, and the remainder of the medical screening examination    Ashley Murrain, NP 03/12/18 1930    Rex Kras Wenda Overland, MD 03/15/18 1032

## 2018-03-13 ENCOUNTER — Emergency Department (HOSPITAL_COMMUNITY): Payer: Medicare Other

## 2018-03-13 ENCOUNTER — Encounter (HOSPITAL_COMMUNITY): Payer: Self-pay | Admitting: General Practice

## 2018-03-13 ENCOUNTER — Observation Stay (HOSPITAL_COMMUNITY): Payer: Medicare Other

## 2018-03-13 DIAGNOSIS — R29898 Other symptoms and signs involving the musculoskeletal system: Secondary | ICD-10-CM | POA: Diagnosis present

## 2018-03-13 DIAGNOSIS — R161 Splenomegaly, not elsewhere classified: Secondary | ICD-10-CM | POA: Diagnosis not present

## 2018-03-13 DIAGNOSIS — I2510 Atherosclerotic heart disease of native coronary artery without angina pectoris: Secondary | ICD-10-CM | POA: Diagnosis not present

## 2018-03-13 DIAGNOSIS — G8929 Other chronic pain: Secondary | ICD-10-CM | POA: Diagnosis present

## 2018-03-13 DIAGNOSIS — M549 Dorsalgia, unspecified: Secondary | ICD-10-CM

## 2018-03-13 DIAGNOSIS — M545 Low back pain: Secondary | ICD-10-CM | POA: Diagnosis not present

## 2018-03-13 DIAGNOSIS — E118 Type 2 diabetes mellitus with unspecified complications: Secondary | ICD-10-CM | POA: Diagnosis not present

## 2018-03-13 DIAGNOSIS — R509 Fever, unspecified: Secondary | ICD-10-CM | POA: Diagnosis present

## 2018-03-13 DIAGNOSIS — R195 Other fecal abnormalities: Secondary | ICD-10-CM | POA: Diagnosis not present

## 2018-03-13 DIAGNOSIS — M5136 Other intervertebral disc degeneration, lumbar region: Secondary | ICD-10-CM | POA: Diagnosis not present

## 2018-03-13 LAB — BLOOD CULTURE ID PANEL (REFLEXED)
Acinetobacter baumannii: NOT DETECTED
CANDIDA ALBICANS: NOT DETECTED
Candida glabrata: NOT DETECTED
Candida krusei: NOT DETECTED
Candida parapsilosis: NOT DETECTED
Candida tropicalis: NOT DETECTED
ENTEROBACTERIACEAE SPECIES: NOT DETECTED
ENTEROCOCCUS SPECIES: NOT DETECTED
Enterobacter cloacae complex: NOT DETECTED
Escherichia coli: NOT DETECTED
HAEMOPHILUS INFLUENZAE: NOT DETECTED
KLEBSIELLA OXYTOCA: NOT DETECTED
Klebsiella pneumoniae: NOT DETECTED
LISTERIA MONOCYTOGENES: NOT DETECTED
NEISSERIA MENINGITIDIS: NOT DETECTED
PSEUDOMONAS AERUGINOSA: NOT DETECTED
Proteus species: NOT DETECTED
SERRATIA MARCESCENS: NOT DETECTED
STAPHYLOCOCCUS AUREUS BCID: NOT DETECTED
STREPTOCOCCUS PYOGENES: NOT DETECTED
Staphylococcus species: NOT DETECTED
Streptococcus agalactiae: NOT DETECTED
Streptococcus pneumoniae: NOT DETECTED
Streptococcus species: DETECTED — AB

## 2018-03-13 LAB — CBC
HEMATOCRIT: 31.1 % — AB (ref 39.0–52.0)
Hemoglobin: 9.9 g/dL — ABNORMAL LOW (ref 13.0–17.0)
MCH: 29.6 pg (ref 26.0–34.0)
MCHC: 31.8 g/dL (ref 30.0–36.0)
MCV: 93.1 fL (ref 80.0–100.0)
NRBC: 0 % (ref 0.0–0.2)
Platelets: 105 10*3/uL — ABNORMAL LOW (ref 150–400)
RBC: 3.34 MIL/uL — ABNORMAL LOW (ref 4.22–5.81)
RDW: 14.1 % (ref 11.5–15.5)
WBC: 11 10*3/uL — ABNORMAL HIGH (ref 4.0–10.5)

## 2018-03-13 LAB — GLUCOSE, CAPILLARY
GLUCOSE-CAPILLARY: 221 mg/dL — AB (ref 70–99)
GLUCOSE-CAPILLARY: 204 mg/dL — AB (ref 70–99)
Glucose-Capillary: 226 mg/dL — ABNORMAL HIGH (ref 70–99)

## 2018-03-13 LAB — URINALYSIS, ROUTINE W REFLEX MICROSCOPIC
BILIRUBIN URINE: NEGATIVE
Glucose, UA: NEGATIVE mg/dL
Ketones, ur: NEGATIVE mg/dL
Leukocytes, UA: NEGATIVE
NITRITE: NEGATIVE
PH: 5 (ref 5.0–8.0)
Protein, ur: 100 mg/dL — AB
SPECIFIC GRAVITY, URINE: 1.015 (ref 1.005–1.030)

## 2018-03-13 LAB — HEMOGLOBIN A1C
Hgb A1c MFr Bld: 7.5 % — ABNORMAL HIGH (ref 4.8–5.6)
Mean Plasma Glucose: 168.55 mg/dL

## 2018-03-13 LAB — CREATININE, SERUM
Creatinine, Ser: 5.74 mg/dL — ABNORMAL HIGH (ref 0.61–1.24)
GFR, EST AFRICAN AMERICAN: 11 mL/min — AB (ref >60)
GFR, EST NON AFRICAN AMERICAN: 10 mL/min — AB (ref >60)

## 2018-03-13 LAB — SEDIMENTATION RATE: SED RATE: 84 mm/h — AB (ref 0–16)

## 2018-03-13 LAB — I-STAT CG4 LACTIC ACID, ED: Lactic Acid, Venous: 1.3 mmol/L (ref 0.5–1.9)

## 2018-03-13 LAB — C-REACTIVE PROTEIN: CRP: 22.6 mg/dL — ABNORMAL HIGH (ref <1.0)

## 2018-03-13 MED ORDER — HEPARIN SODIUM (PORCINE) 5000 UNIT/ML IJ SOLN
5000.0000 [IU] | Freq: Three times a day (TID) | INTRAMUSCULAR | Status: DC
  Administered 2018-03-13 – 2018-03-21 (×22): 5000 [IU] via SUBCUTANEOUS
  Filled 2018-03-13 (×24): qty 1

## 2018-03-13 MED ORDER — SODIUM CHLORIDE 0.9 % IV SOLN
2.0000 g | Freq: Once | INTRAVENOUS | Status: AC
  Administered 2018-03-13: 2 g via INTRAVENOUS
  Filled 2018-03-13: qty 2

## 2018-03-13 MED ORDER — SODIUM CHLORIDE 0.9 % IV SOLN
1.0000 g | INTRAVENOUS | Status: DC
  Administered 2018-03-13: 1 g via INTRAVENOUS
  Filled 2018-03-13 (×2): qty 1

## 2018-03-13 MED ORDER — INSULIN ASPART 100 UNIT/ML ~~LOC~~ SOLN
0.0000 [IU] | Freq: Three times a day (TID) | SUBCUTANEOUS | Status: DC
  Administered 2018-03-13 (×2): 3 [IU] via SUBCUTANEOUS
  Administered 2018-03-14: 2 [IU] via SUBCUTANEOUS
  Administered 2018-03-14: 3 [IU] via SUBCUTANEOUS
  Administered 2018-03-14: 2 [IU] via SUBCUTANEOUS
  Administered 2018-03-15 (×2): 1 [IU] via SUBCUTANEOUS
  Administered 2018-03-15: 3 [IU] via SUBCUTANEOUS
  Administered 2018-03-16 (×2): 1 [IU] via SUBCUTANEOUS
  Administered 2018-03-16: 2 [IU] via SUBCUTANEOUS
  Administered 2018-03-18 (×2): 1 [IU] via SUBCUTANEOUS
  Administered 2018-03-19: 2 [IU] via SUBCUTANEOUS
  Administered 2018-03-19: 1 [IU] via SUBCUTANEOUS
  Administered 2018-03-19: 2 [IU] via SUBCUTANEOUS
  Administered 2018-03-20: 1 [IU] via SUBCUTANEOUS
  Filled 2018-03-13 (×7): qty 1

## 2018-03-13 MED ORDER — VANCOMYCIN HCL IN DEXTROSE 750-5 MG/150ML-% IV SOLN
750.0000 mg | INTRAVENOUS | Status: DC
  Administered 2018-03-14: 750 mg via INTRAVENOUS
  Filled 2018-03-13: qty 150

## 2018-03-13 MED ORDER — NEPRO/CARBSTEADY PO LIQD
237.0000 mL | Freq: Two times a day (BID) | ORAL | Status: DC
  Administered 2018-03-14 – 2018-03-19 (×9): 237 mL via ORAL
  Filled 2018-03-13 (×16): qty 237

## 2018-03-13 MED ORDER — HYDROMORPHONE HCL 1 MG/ML IJ SOLN
0.5000 mg | INTRAMUSCULAR | Status: DC | PRN
  Administered 2018-03-13 – 2018-03-14 (×4): 0.5 mg via INTRAVENOUS
  Filled 2018-03-13 (×4): qty 1

## 2018-03-13 MED ORDER — TIMOLOL MALEATE 0.5 % OP SOLN
1.0000 [drp] | Freq: Two times a day (BID) | OPHTHALMIC | Status: DC
  Administered 2018-03-13 – 2018-03-21 (×16): 1 [drp] via OPHTHALMIC
  Filled 2018-03-13 (×2): qty 5

## 2018-03-13 MED ORDER — INSULIN GLARGINE 100 UNIT/ML ~~LOC~~ SOLN
10.0000 [IU] | Freq: Two times a day (BID) | SUBCUTANEOUS | Status: DC
  Administered 2018-03-13 – 2018-03-21 (×15): 10 [IU] via SUBCUTANEOUS
  Filled 2018-03-13 (×18): qty 0.1

## 2018-03-13 MED ORDER — GABAPENTIN 300 MG PO CAPS
300.0000 mg | ORAL_CAPSULE | Freq: Every day | ORAL | Status: DC
  Administered 2018-03-13 – 2018-03-21 (×9): 300 mg via ORAL
  Filled 2018-03-13 (×9): qty 1

## 2018-03-13 MED ORDER — SEVELAMER CARBONATE 800 MG PO TABS
800.0000 mg | ORAL_TABLET | Freq: Three times a day (TID) | ORAL | Status: DC
  Administered 2018-03-13 – 2018-03-20 (×19): 800 mg via ORAL
  Filled 2018-03-13 (×19): qty 1

## 2018-03-13 MED ORDER — MORPHINE SULFATE (PF) 4 MG/ML IV SOLN
4.0000 mg | Freq: Once | INTRAVENOUS | Status: AC
  Administered 2018-03-13: 4 mg via INTRAVENOUS
  Filled 2018-03-13: qty 1

## 2018-03-13 MED ORDER — AMLODIPINE BESYLATE 5 MG PO TABS
2.5000 mg | ORAL_TABLET | Freq: Every day | ORAL | Status: DC
  Administered 2018-03-13 – 2018-03-19 (×7): 2.5 mg via ORAL
  Filled 2018-03-13 (×7): qty 1

## 2018-03-13 MED ORDER — CARVEDILOL 6.25 MG PO TABS
6.2500 mg | ORAL_TABLET | Freq: Two times a day (BID) | ORAL | Status: DC
  Administered 2018-03-13 – 2018-03-21 (×13): 6.25 mg via ORAL
  Filled 2018-03-13 (×14): qty 1

## 2018-03-13 MED ORDER — FUROSEMIDE 80 MG PO TABS
80.0000 mg | ORAL_TABLET | Freq: Two times a day (BID) | ORAL | Status: DC
  Administered 2018-03-13 – 2018-03-21 (×14): 80 mg via ORAL
  Filled 2018-03-13 (×14): qty 1

## 2018-03-13 MED ORDER — INSULIN ASPART 100 UNIT/ML ~~LOC~~ SOLN
0.0000 [IU] | Freq: Every day | SUBCUTANEOUS | Status: DC
  Administered 2018-03-13 – 2018-03-20 (×2): 2 [IU] via SUBCUTANEOUS
  Filled 2018-03-13: qty 1

## 2018-03-13 MED ORDER — RENA-VITE PO TABS
1.0000 | ORAL_TABLET | Freq: Every day | ORAL | Status: DC
  Administered 2018-03-13 – 2018-03-20 (×8): 1 via ORAL
  Filled 2018-03-13 (×8): qty 1

## 2018-03-13 MED ORDER — POLYETHYLENE GLYCOL 3350 17 G PO PACK
17.0000 g | PACK | Freq: Every day | ORAL | Status: DC | PRN
  Administered 2018-03-13: 17 g via ORAL
  Filled 2018-03-13: qty 1

## 2018-03-13 MED ORDER — METRONIDAZOLE IN NACL 5-0.79 MG/ML-% IV SOLN
500.0000 mg | Freq: Three times a day (TID) | INTRAVENOUS | Status: DC
  Administered 2018-03-13 – 2018-03-15 (×7): 500 mg via INTRAVENOUS
  Filled 2018-03-13 (×8): qty 100

## 2018-03-13 MED ORDER — VANCOMYCIN HCL IN DEXTROSE 1-5 GM/200ML-% IV SOLN
1000.0000 mg | Freq: Once | INTRAVENOUS | Status: AC
  Administered 2018-03-13: 1000 mg via INTRAVENOUS
  Filled 2018-03-13: qty 200

## 2018-03-13 MED ORDER — HYDROMORPHONE HCL 1 MG/ML IJ SOLN
0.5000 mg | Freq: Four times a day (QID) | INTRAMUSCULAR | Status: DC | PRN
  Administered 2018-03-13: 0.5 mg via INTRAVENOUS
  Filled 2018-03-13: qty 1

## 2018-03-13 MED ORDER — ATORVASTATIN CALCIUM 40 MG PO TABS
40.0000 mg | ORAL_TABLET | Freq: Every day | ORAL | Status: DC
  Administered 2018-03-13 – 2018-03-21 (×9): 40 mg via ORAL
  Filled 2018-03-13 (×9): qty 1

## 2018-03-13 MED ORDER — SODIUM CHLORIDE 0.9 % IV SOLN
INTRAVENOUS | Status: DC | PRN
  Administered 2018-03-13: 500 mL via INTRAVENOUS

## 2018-03-13 MED ORDER — CYCLOBENZAPRINE HCL 10 MG PO TABS
10.0000 mg | ORAL_TABLET | Freq: Three times a day (TID) | ORAL | Status: DC | PRN
  Administered 2018-03-13 – 2018-03-21 (×10): 10 mg via ORAL
  Filled 2018-03-13 (×12): qty 1

## 2018-03-13 NOTE — Progress Notes (Signed)
Patient ID: Russell Price, male   DOB: July 01, 1960, 57 y.o.   MRN: [id number] MRI reviewed appears pain is disproportionate to the patient's results on his MRI scan.Marland Kitchenaortic stenosis mentioned ordering sedimentation rate to try hospitalist looks like this was performed and came back over 80 recommend if the patient can receive IV contrast to get an MRI scan with contrast and rule out discitis. Also recommended blood cultures and ID consult.

## 2018-03-13 NOTE — Progress Notes (Signed)
Inpatient Diabetes Program Recommendations  AACE/ADA: New Consensus Statement on Inpatient Glycemic Control (2015)  Target Ranges:  Prepandial:   less than 140 mg/dL      Peak postprandial:   less than 180 mg/dL (1-2 hours)      Critically ill patients:  140 - 180 mg/dL   Results for AASHIR, UMHOLTZ (MRN [id number]) as of 03/13/2018 09:26  Ref. Range 03/12/2018 19:28  Glucose Latest Ref Range: 70 - 99 mg/dL 228 (H)   Review of Glycemic Control  Diabetes history: DM 2 Outpatient Diabetes medications: Lantus 10 units qam, 12 units qpm, Novolog 0-12 units tid Current orders for Inpatient glycemic control: None  Inpatient Diabetes Program Recommendations:    Glucose 228 in labs on presentation. Patient has a hx of DM consider CBG checks ACHS and if elevated consider half of home Lantus 5 units BID and Novolog 0-9 units tid.  Thanks,  Tama Headings RN, MSN, BC-ADM Inpatient Diabetes Coordinator Team Pager [phone] (8a-5p)

## 2018-03-13 NOTE — Progress Notes (Signed)
Pharmacy Antibiotic Note  Russell Price is a 57 y.o. male with ESRD and HD TTSat admitted on 03/12/2018 with possible sepsis.  Pharmacy has been consulted for Vancomycin and Cefepime  Dosing. Vancomycin 1 g IV given in ED at  [place]: Vancomycin 750 mg IV after each HD Cefepime 1 g IV q24h     Temp (24hrs), Avg:99.8 F (37.7 C), Min:98.9 F (37.2 C), Max:100.7 F (38.2 C)  Recent Labs  Lab 03/12/18 1928 03/13/18 0134  WBC 10.4  --   CREATININE 3.85*  --   LATICACIDVEN  --  1.30    CrCl cannot be calculated (Unknown ideal weight.).    No Known Allergies   Russell Price 03/13/2018 8:18 AM

## 2018-03-13 NOTE — Care Management Obs Status (Signed)
[place] NOTIFICATION   Patient Details  Name: DAWOOD SPITLER MRN: [id number] Date of Birth: [birth year]   Medicare Observation Status Notification Given:  Yes    Fuller Mandril, RN 03/13/2018, 12:14 PM

## 2018-03-13 NOTE — Progress Notes (Signed)
PHARMACY - PHYSICIAN COMMUNICATION CRITICAL VALUE ALERT - BLOOD CULTURE IDENTIFICATION (BCID)  Russell Price is an 57 y.o. male who presented to [facility] on 03/12/2018 with a chief complaint of low back pain  Assessment:  Awaiting to see if pt have contrasted MRI to rule out discitis.   Name of physician (or Provider) Contacted: X Blount ([facility])  Current antibiotics: Vancomycin/Cefepime/Flagyl  Changes to prescribed antibiotics recommended:  Continue broad spectrum anti-biotics until discitis can be ruled out  Results for orders placed or performed during the hospital encounter of 03/12/18  Blood Culture ID Panel (Reflexed) (Collected: 03/13/2018  1:24 AM)  Result Value Ref Range   Enterococcus species NOT DETECTED NOT DETECTED   Listeria monocytogenes NOT DETECTED NOT DETECTED   Staphylococcus species NOT DETECTED NOT DETECTED   Staphylococcus aureus (BCID) NOT DETECTED NOT DETECTED   Streptococcus species DETECTED (A) NOT DETECTED   Streptococcus agalactiae NOT DETECTED NOT DETECTED   Streptococcus pneumoniae NOT DETECTED NOT DETECTED   Streptococcus pyogenes NOT DETECTED NOT DETECTED   Acinetobacter baumannii NOT DETECTED NOT DETECTED   Enterobacteriaceae species NOT DETECTED NOT DETECTED   Enterobacter cloacae complex NOT DETECTED NOT DETECTED   Escherichia coli NOT DETECTED NOT DETECTED   Klebsiella oxytoca NOT DETECTED NOT DETECTED   Klebsiella pneumoniae NOT DETECTED NOT DETECTED   Proteus species NOT DETECTED NOT DETECTED   Serratia marcescens NOT DETECTED NOT DETECTED   Haemophilus influenzae NOT DETECTED NOT DETECTED   Neisseria meningitidis NOT DETECTED NOT DETECTED   Pseudomonas aeruginosa NOT DETECTED NOT DETECTED   Candida albicans NOT DETECTED NOT DETECTED   Candida glabrata NOT DETECTED NOT DETECTED   Candida krusei NOT DETECTED NOT DETECTED   Candida parapsilosis NOT DETECTED NOT DETECTED   Candida tropicalis NOT DETECTED NOT DETECTED    [name] 03/13/2018  10:49 PM

## 2018-03-13 NOTE — Progress Notes (Signed)
Assumed care on pt.,pt.resting with no distress, respirations unlabored , IV site intact , no back pain at this time ,family at bedside , plan of care explained to pt.

## 2018-03-13 NOTE — ED Notes (Signed)
Patient transported to MRI 

## 2018-03-13 NOTE — ED Provider Notes (Signed)
[facility] Provider Note   CSN: [id number] Arrival date & time: 03/12/18  1824     History   Chief Complaint Chief Complaint  Patient presents with  . Back Pain    HPI Russell Price is a 57 y.o. male.  Patient with history of ESRD on dialysis Tuesday Thursday Saturday, hypertension, diabetes, coronary disease presenting with 2-day history of low back pain.  The pain is his low back diffusely and does not radiate down his legs.  Denies any falls or trauma or previous history of back problems.  Has had subjective fevers at home and T-max to 100.7 on arrival.  States compliance with dialysis.  No nausea, vomiting, abdominal pain.  Does make some urine and reports no pain with urination or blood in the urine.  No focal weakness in lower extremities.  No bowel or bladder incontinence.  Having pain and difficulty walking due to severe pain in his back.  The history is provided by the patient.  Back Pain   Associated symptoms include a fever. Pertinent negatives include no headaches, no abdominal pain, no dysuria and no weakness.    Past Medical History:  Diagnosis Date  . CAD (coronary artery disease)    a. underwent cath in 02/2016 after an abnormal nuc which showed 2V CAD in RCA and OM3 with no good targets for PCI. Medical managment recommended.   . Chronic kidney disease   . Diabetes mellitus without complication ([place])    type II - followed by Dr Chalmers Cater   . Difficult intubation    ER intubation 2016  . ESRD (end stage renal disease) ([place])    Tues, Th, Sat [place]  . Glaucoma   . Hemodialysis patient [facility])    has been on dialysis since approx 10/2016   . Hypertension    followed by [facility]  . Stroke [facility])    patient denies on 02/19/2017     Patient Active Problem List   Diagnosis Date Noted  . Sepsis ([place]) 02/06/2017  . Ascites   . Hyponatremia 11/06/2016  . Fluid overload 11/06/2016  . CAD (coronary  artery disease)   . Abnormal nuclear stress test 03/04/2016  . ESRD (end stage renal disease) ([place])   . Diabetic neuropathy ([place]) 10/05/2015  . Left arm weakness   . Acute encephalopathy   . Hyperlipidemia 09/21/2015  . Labile blood pressure   . Labile blood glucose   . Diabetes mellitus type 2 in nonobese ([facility])   . Anemia of chronic disease   . Debility 09/08/2015  . Acidemia   . Seizure-like activity ([place])   . Acute kidney injury ([place])   . Cerebral thrombosis with cerebral infarction ([place]) 02/18/2015  . Essential hypertension 11/11/2014  . DM (diabetes mellitus) with complications ([place]) 53/61/4431  . Blind right eye 11/11/2014    Past Surgical History:  Procedure Laterality Date  . A/V FISTULAGRAM Left 11/27/2016   Procedure: A/V Fistulagram;  Surgeon: Waynetta Sandy, MD;  Location: [facility];  Service: Cardiovascular;  Laterality: Left;  . A/V FISTULAGRAM Left 05/07/2017   Procedure: A/V FISTULAGRAM;  Surgeon: Waynetta Sandy, MD;  Location: [place];  Service: Cardiovascular;  Laterality: Left;  . A/V FISTULAGRAM Left 07/14/2017   Procedure: A/V FISTULAGRAM;  Surgeon: Waynetta Sandy, MD;  Location: [facility];  Service: Cardiovascular;  Laterality: Left;  . AV FISTULA PLACEMENT Left 03/12/2016   Procedure: Left Arm ARTERIOVENOUS (AV)  FISTULA CREATION;  Surgeon: Waynetta Sandy, MD;  Location: [facility];  Service: Vascular;  Laterality: Left;  . AV FISTULA PLACEMENT Left 07/18/2017   Procedure: INSERTION OF ARTERIOVENOUS (AV) GORE-TEX GRAFT ARM LEFT UPPER ARM;  Surgeon: Conrad [place], MD;  Location: [facility];  Service: Vascular;  Laterality: Left;  . BASCILIC VEIN TRANSPOSITION Left 05/14/2016   Procedure: SECOND STAGE LEFT BASILIC VEIN TRANSPOSITION;  Surgeon: Waynetta Sandy, MD;  Location: [place] [facility];  Service: Vascular;  Laterality: Left;  . BRONCHOSCOPY  02/14/2015   for pulm hemorrhage  . CARDIAC CATHETERIZATION  N/A 03/04/2016   Procedure: Left Heart Cath and Coronary Angiography;  Surgeon: Leonie Man, MD;  Location: [facility];  Service: Cardiovascular;  Laterality: N/A;  . COLONOSCOPY WITH PROPOFOL N/A 04/07/2017   Procedure: COLONOSCOPY WITH PROPOFOL;  Surgeon: Ronnette Juniper, MD;  Location: [facility];  Service: Gastroenterology;  Laterality: N/A;  . EYE SURGERY Left   . INSERTION OF DIALYSIS CATHETER Right 11/08/2016   Procedure: INSERTION OF DIALYSIS CATHETER;  Surgeon: Rosetta Posner, MD;  Location: [facility];  Service: Vascular;  Laterality: Right;  . IR PARACENTESIS  11/13/2016  . LIGATION OF ARTERIOVENOUS  FISTULA Left 07/18/2017   Procedure: LIGATION OF ARTERIOVENOUS  FISTULA;  Surgeon: Conrad [place], MD;  Location: [facility];  Service: Vascular;  Laterality: Left;  . PERIPHERAL VASCULAR BALLOON ANGIOPLASTY Left 11/27/2016   Procedure: Peripheral Vascular Balloon Angioplasty;  Surgeon: Waynetta Sandy, MD;  Location: [place] [facility];  Service: Cardiovascular;  Laterality: Left;  ARM FISTULA  . PERIPHERAL VASCULAR BALLOON ANGIOPLASTY Left 05/07/2017   Procedure: PERIPHERAL VASCULAR BALLOON ANGIOPLASTY;  Surgeon: Waynetta Sandy, MD;  Location: [facility];  Service: Cardiovascular;  Laterality: Left;        Home Medications    Prior to Admission medications   Medication Sig Start Date End Date Taking? Authorizing Provider  ACCU-CHEK SOFTCLIX LANCETS lancets Use as instructed 3 times daily before meals and at bedtime. E11.9 06/16/17   Charlott Rakes, MD  amLODipine (NORVASC) 2.5 MG tablet Take 1 tablet (2.5 mg total) by mouth daily. 12/15/17   Charlott Rakes, MD  aspirin 81 MG chewable tablet Chew 1 tablet (81 mg total) by mouth daily. Patient taking differently: Chew 81 mg by mouth every evening.  09/14/15   Angiulli, Lavon Paganini, PA-C  atorvastatin (LIPITOR) 40 MG tablet Take 1 tablet (40 mg total) by mouth daily at 6 PM. 12/15/17   Charlott Rakes, MD  Blood  Glucose Monitoring Suppl (ACCU-CHEK AVIVA) device Use as instructed 3 times daily before meals and at bedtime. E11.9 05/07/17   Charlott Rakes, MD  Blood Glucose Monitoring Suppl (TRUE METRIX METER) DEVI 1 each by Does not apply route 3 (three) times daily. 05/05/17   Charlott Rakes, MD  calcitRIOL (ROCALTROL) 0.25 MCG capsule Take 1 capsule (0.25 mcg total) by mouth daily. 07/15/17   Charlott Rakes, MD  carvedilol (COREG) 6.25 MG tablet TAKE 2 TABLETS BY MOUTH 2 TIMES DAILY WITH A MEAL. 02/16/18   Charlott Rakes, MD  Darbepoetin Alfa (ARANESP) 150 MCG/0.3ML SOSY injection Inject 0.3 mLs (150 mcg total) into the vein every Saturday with hemodialysis. Patient not taking: Reported on 12/15/2017 11/16/16   Jani Gravel, MD  furosemide (LASIX) 80 MG tablet TAKE 1 TABLET BY MOUTH 2 TIMES DAILY. 12/22/17   Charlott Rakes, MD  gabapentin (NEURONTIN) 300 MG capsule Take 1 capsule (300 mg total) by mouth daily. 12/15/17   Newlin,  Enobong, MD  glucose blood (ACCU-CHEK AVIVA PLUS) test strip Use 3 times daily before meals and at bedtime. E11.9 06/16/17   Charlott Rakes, MD  hydrocortisone (ANUSOL-HC) 2.5 % rectal cream Place rectally 2 (two) times daily. Patient not taking: Reported on 12/15/2017 11/16/16   Jani Gravel, MD  insulin aspart (NOVOLOG) 100 UNIT/ML injection 0-12 units 3 times daily before meals as well as per sliding scale 12/15/17   Charlott Rakes, MD  insulin glargine (LANTUS) 100 UNIT/ML injection Inject subcutaneously 10 units of Lantus in the morning and 15 units of Lantus in the evening 12/15/17   Charlott Rakes, MD  Lancet Devices [facility]) lancets Use as instructed 3 times daily before meals and at bedtime. E11.9 05/07/17   Charlott Rakes, MD  Nutritional Supplements (FEEDING SUPPLEMENT, NEPRO CARB STEADY,) LIQD Take 237 mLs by mouth 2 (two) times daily between meals. 11/16/16   Jani Gravel, MD  ondansetron (ZOFRAN) 4 MG tablet Take 1 tablet (4 mg total) by mouth every 8 (eight) hours as  needed for nausea or vomiting. Patient not taking: Reported on 12/15/2017 03/14/17   Charlott Rakes, MD  oxyCODONE (OXY IR/ROXICODONE) 5 MG immediate release tablet Take 1 tablet (5 mg total) by mouth every 6 (six) hours as needed. Patient not taking: Reported on 12/15/2017 07/22/17   Charlann Lange, PA-C  polyethylene glycol (MIRALAX / GLYCOLAX) packet Take 17 g by mouth daily. 11/16/16   Jani Gravel, MD  RENAGEL 800 MG tablet Take 800-1,600 mg by mouth See admin instructions. Take 1600 mg by mouth 3 times daily with meals and take 800 mg by mouth twice daily with snacks 05/23/17   [provider]    Family History Family History  Problem Relation Age of Onset  . Diabetes Mother     Social History Social History   Tobacco Use  . Smoking status: Former Smoker    Packs/day: 0.00    Years: 4.00    Pack years: 0.00    Last attempt to quit: 02/23/1983    Years since quitting: 35.0  . Smokeless tobacco: Never Used  Substance Use Topics  . Alcohol use: No  . Drug use: No     Allergies   Patient has no known allergies.   Review of Systems Review of Systems  Constitutional: Positive for chills and fever. Negative for activity change.  HENT: Negative for congestion.   Eyes: Negative for visual disturbance.  Respiratory: Negative for cough, chest tightness and shortness of breath.   Gastrointestinal: Negative for abdominal pain and vomiting.  Genitourinary: Negative for dysuria and penile pain.  Musculoskeletal: Positive for arthralgias, back pain and myalgias.  Skin: Negative for rash.  Neurological: Negative for dizziness, weakness and headaches.   all other systems are negative except as noted in the HPI and PMH.     Physical Exam Updated Vital Signs BP (!) 145/85   Pulse 78   Temp 98.9 F (37.2 C) (Oral)   Resp 18   SpO2 100%   Physical Exam  Constitutional: He is oriented to person, place, and time. He appears well-developed and well-nourished. No distress.    HENT:  Head: Normocephalic and atraumatic.  Mouth/Throat: Oropharynx is clear and moist. No oropharyngeal exudate.  Eyes: Pupils are equal, round, and reactive to light. Conjunctivae and EOM are normal.  R cornea chronically opacified  Neck: Normal range of motion. Neck supple.  No meningismus.  Cardiovascular: Normal rate, regular rhythm, normal heart sounds and intact distal pulses.  No murmur  heard. Pulmonary/Chest: Effort normal and breath sounds normal. No respiratory distress.  Abdominal: Soft. There is no tenderness. There is no rebound and no guarding.  Musculoskeletal: Normal range of motion. He exhibits no edema or tenderness.  Left paraspinal lumbar tenderness, there is diffuse tenderness across low back without midline tenderness  5/5 strength of lower extremities.  Intact DP and PT pulses with Doppler.  Ankle flexion and extension intact bilaterally, great toe extension intact bilaterally Unable to elicit patellar reflexes bilaterally  Neurological: He is alert and oriented to person, place, and time. No cranial nerve deficit. He exhibits normal muscle tone. Coordination normal.  No ataxia on finger to nose bilaterally. No pronator drift. 5/5 strength throughout. CN 2-12 intact.Equal grip strength. Sensation intact.   Skin: Skin is warm.  Psychiatric: He has a normal mood and affect. His behavior is normal.  Nursing note and vitals reviewed.    ED Treatments / Results  Labs (all labs ordered are listed, but only abnormal results are displayed) Labs Reviewed  CBC - Abnormal; Notable for the following components:      Result Value   RBC 3.54 (*)    Hemoglobin 10.7 (*)    HCT 32.6 (*)    Platelets 95 (*)    All other components within normal limits  BASIC METABOLIC PANEL - Abnormal; Notable for the following components:   Potassium 3.4 (*)    Chloride 93 (*)    Glucose, Bld 228 (*)    BUN 22 (*)    Creatinine, Ser 3.85 (*)    GFR calc non Af Amer 16 (*)    GFR  calc Af Amer 18 (*)    Anion gap 17 (*)    All other components within normal limits  URINALYSIS, ROUTINE W REFLEX MICROSCOPIC - Abnormal; Notable for the following components:   Color, Urine AMBER (*)    APPearance HAZY (*)    Hgb urine dipstick SMALL (*)    Protein, ur 100 (*)    Bacteria, UA RARE (*)    All other components within normal limits  CULTURE, BLOOD (ROUTINE X 2)  CULTURE, BLOOD (ROUTINE X 2)  URINE CULTURE  I-STAT CG4 LACTIC ACID, ED  I-STAT CG4 LACTIC ACID, ED    EKG None  Radiology Ct Abdomen Pelvis Wo Contrast  Result Date: 03/13/2018 CLINICAL DATA:  Back pain. Cancer or infection suspected. Dialysis patient. EXAM: CT ABDOMEN AND PELVIS WITHOUT CONTRAST TECHNIQUE: Multidetector CT imaging of the abdomen and pelvis was performed following the standard protocol without IV contrast. COMPARISON:  Abdominal radiograph 09/02/2015. Renal ultrasound 02/15/2015. No prior CT. FINDINGS: Lower chest: Hypoventilatory atelectasis right greater than left lung base. Normal heart size. There are coronary artery calcifications. Minimal pericardial fluid without significant effusion. Hepatobiliary: Lack of IV contrast and motion artifact limits assessment. Allowing for these limitations, no focal hepatic abnormality. No gallstones, pericholecystic inflammation, or biliary dilatation. Pancreas: Grossly normal. No ductal dilatation or inflammation. Spleen: Upper normal in size spanning 13.7 cm cranial caudal. Adrenals/Urinary Tract: No adrenal nodule. No hydronephrosis. No significant perinephric edema. Dense renovascular calcifications. Urinary bladder is nondistended. Stomach/Bowel: Lack of enteric contrast and paucity of intra-abdominal fat limits bowel assessment. Stomach is partially distended. No bowel obstruction. No evidence of bowel inflammation or wall thickening. Moderate volume of stool throughout the colon with colonic tortuosity. Small volume of stool in the rectum. Normal  appendix. Vascular/Lymphatic: Advanced vascular calcifications of the aorta and branches. No bulky adenopathy allowing for limitations. Reproductive: Prostate is unremarkable.  Other: No ascites or free air.  No intra-abdominal fluid collection. Musculoskeletal: Degenerative disc disease at L5-S1. Small Schmorl's node superior endplate of L4. There are no acute or suspicious osseous abnormalities. IMPRESSION: 1. No acute findings in the abdomen/pelvis. No evidence of infection or malignancy on noncontrast exam. 2. Large colonic stool burden can be seen with constipation. 3. Mild splenomegaly, nonspecific. 4. Advanced vascular calcifications. Aortic Atherosclerosis (ICD10-I70.0). Electronically Signed   By: Keith Rake M.D.   On: 03/13/2018 04:33   Mr Lumbar Spine Wo Contrast  Result Date: 03/13/2018 CLINICAL DATA:  Low back pain radiating to both legs with difficulty walking. History of end-stage renal disease on dialysis. EXAM: MRI LUMBAR SPINE WITHOUT CONTRAST TECHNIQUE: Multiplanar, multisequence MR imaging of the lumbar spine was performed. No intravenous contrast was administered. COMPARISON:  CT abdomen and pelvis 03/13/2018 FINDINGS: Segmentation:  Standard. Alignment: Straightening of the normal lumbar lordosis. Trace retrolisthesis of L3 on L4. Vertebrae: Diminished bone marrow signal intensity diffusely which may be related to patient's history of end-stage renal disease and anemia. No suspicious focal osseous lesion or fracture. Small L4 superior endplate Schmorl's node with primarily chronic surrounding degenerative marrow changes. Conus medullaris and cauda equina: Conus extends to the T12-L1 level. Conus and cauda equina appear normal. Paraspinal and other soft tissues: Unremarkable. Disc levels: Disc desiccation from L3-4 to L5-S1. Mild disc space narrowing at L3-4 and L5-S1. L1-2: Negative. L2-3: Minimal disc bulging most notable in the left extraforaminal region. No stenosis. L3-4: Disc  bulging, congenitally short pedicles, and slight facet and ligamentum flavum hypertrophy result in borderline lateral recess and borderline neural foraminal stenosis bilaterally without spinal stenosis. L4-5: Disc bulging and mild facet and ligamentum flavum hypertrophy result in borderline lateral recess and borderline left neural foraminal stenosis without spinal stenosis. A small central annular fissure is noted. L5-S1: Circumferential disc bulging, a left foraminal disc extrusion, and mild facet hypertrophy result in borderline right and severe left neural foraminal stenosis with potential left L5 nerve root impingement. No significant spinal stenosis. IMPRESSION: 1. Disc degeneration most notable at L5-S1 where there is severe left neural foraminal stenosis due to a disc extrusion. 2. Mild disc and facet degeneration at L3-4 and L4-5 without significant stenosis. Electronically Signed   By: Logan Bores M.D.   On: 03/13/2018 07:59    Procedures Procedures (including critical care time)  Medications Ordered in ED Medications  morphine 4 MG/ML injection 4 mg (has no administration in time range)  ceFEPIme (MAXIPIME) 2 g in sodium chloride 0.9 % 100 mL IVPB (has no administration in time range)  metroNIDAZOLE (FLAGYL) IVPB 500 mg (has no administration in time range)  vancomycin (VANCOCIN) IVPB 1000 mg/200 mL premix (has no administration in time range)  acetaminophen (TYLENOL) tablet 650 mg (650 mg Oral Given 03/12/18 1924)     Initial Impression / Assessment and Plan / ED Course  I have reviewed the triage vital signs and the nursing notes.  Pertinent labs & imaging results that were available during my care of the patient were reviewed by me and considered in my medical decision making (see chart for details).    Dialysis patient with low back pain and subjective fever.  No focal weakness on exam.  Fever noted. Blood cultures obtained and broad spectrum antibiotics started. UA not  consistent with infection.  CT a/p with constipation. No acute pathology.  Remains hemodynamically stable.  With ESRD and fever, plan admission for IV antibiotics while cultures pending. MRI of lumbar spine  will be obtained to evaluate for source of fever.  D/w Dr. Marlowe Sax.   Final Clinical Impressions(s) / ED Diagnoses   Final diagnoses:  Fever, unspecified fever cause  Acute left-sided low back pain without sciatica    ED Discharge Orders    None       Katia Hannen, Annie Main, MD 03/13/18 1014

## 2018-03-13 NOTE — H&P (Signed)
History and Physical    KALVEN GANIM KZS:[id number] DOB: 1960-06-02 DOA: 03/12/2018  PCP: Charlott Rakes, MD Patient coming from: Home  Chief Complaint: Low back pain  HPI: Russell Price is a 57 y.o. male with medical history significant for ESRD on TTS, hypertension, DM 2, CAD status post CAD (no stent) in 02/2016, glaucoma and blindness in the right eye who presented to ED with low back pain and bilateral leg weakness.  History provided by patient and patient's wife at bedside. Patient reports spontaneous onset of bilateral low back pain 2 days ago.  Denies history of trauma, injury or unusual activity.  Never had similar pain before.  Describes the pain as achy and spastic.  Pain severity 8/10 before arrival and now.  No radiation to his legs.  Pain is worse with movement.  No alleviating factor.  Reports bilateral leg weakness and difficulty walking for the last 2 days.  Denies numbness, tingling or saddle anesthesia..  Denies changes in regards to bowel or bladder habits.  Denies history of cancer or unintentional weight loss.  Felt some chills.  Febrile in ED on arrival.  Denies nausea, vomiting, URI symptoms, chest pain, dyspnea, abdominal pain, dysuria or hematuria.  Good compliance with his dialysis.  In ED, vital signs significant for fever to 100.7.  CBC at baseline.  BMP significant for elevated glucose to 228, mild hypokalemia to 3.4 anion gap of 17.  Bicarb 25.  Lactic acid normal.  UA with small hemoglobin, proteinuria to 100 and red bacteria.  CT abdomen and pelvis not impressive except for colonic stool burden and mild splenomegaly.  MRI lumbar spine with degenerative changes specifically severe left neural foraminal stenosis due to disc protrusion at L5-S1.  Started on vancomycin, cefepime and Flagyl.  Admission accepted by overnight provider.   ROS Review of Systems  Constitutional: Positive for chills and fever. Negative for weight loss.  HENT: Negative for sore throat.    Eyes: Negative for blurred vision, photophobia and pain.  Respiratory: Negative for cough and shortness of breath.   Cardiovascular: Negative for chest pain, palpitations and leg swelling.  Gastrointestinal: Negative for abdominal pain, blood in stool, diarrhea, melena, nausea and vomiting.  Genitourinary: Negative for dysuria and hematuria.  Musculoskeletal: Positive for back pain. Negative for myalgias.  Skin: Negative for rash.  Neurological: Positive for focal weakness. Negative for speech change, weakness and headaches.  Endo/Heme/Allergies: Does not bruise/bleed easily.  Psychiatric/Behavioral: Negative for depression and substance abuse. The patient is not nervous/anxious.     PMH Past Medical History:  Diagnosis Date  . Back pain 03/12/2018  . CAD (coronary artery disease)    a. underwent cath in 02/2016 after an abnormal nuc which showed 2V CAD in RCA and OM3 with no good targets for PCI. Medical managment recommended.   . Chronic kidney disease   . Diabetes mellitus without complication ([place])    type II - followed by Dr Chalmers Cater   . Difficult intubation    ER intubation 2016  . ESRD (end stage renal disease) ([place])    Tues, Th, Sat [facility]  . Fever 03/12/2018  . Glaucoma   . Hemodialysis patient [facility])    has been on dialysis since approx 10/2016   . Hypertension    followed by [facility]  . Stroke [facility])    patient denies on 02/19/2017    [facility] Past Surgical History:  Procedure Laterality Date  . A/V FISTULAGRAM Left 11/27/2016   Procedure: A/V  Fistulagram;  Surgeon: Waynetta Sandy, MD;  Location: [place] CV LAB;  Service: Cardiovascular;  Laterality: Left;  . A/V FISTULAGRAM Left 05/07/2017   Procedure: A/V FISTULAGRAM;  Surgeon: Waynetta Sandy, MD;  Location: [place] CV LAB;  Service: Cardiovascular;  Laterality: Left;  . A/V FISTULAGRAM Left 07/14/2017   Procedure: A/V FISTULAGRAM;  Surgeon: Waynetta Sandy,  MD;  Location: [place] CV LAB;  Service: Cardiovascular;  Laterality: Left;  . AV FISTULA PLACEMENT Left 03/12/2016   Procedure: Left Arm ARTERIOVENOUS (AV) FISTULA CREATION;  Surgeon: Waynetta Sandy, MD;  Location: [facility] OR;  Service: Vascular;  Laterality: Left;  . AV FISTULA PLACEMENT Left 07/18/2017   Procedure: INSERTION OF ARTERIOVENOUS (AV) GORE-TEX GRAFT ARM LEFT UPPER ARM;  Surgeon: Conrad [place], MD;  Location: [place];  Service: Vascular;  Laterality: Left;  . BASCILIC VEIN TRANSPOSITION Left 05/14/2016   Procedure: SECOND STAGE LEFT BASILIC VEIN TRANSPOSITION;  Surgeon: Waynetta Sandy, MD;  Location: [place];  Service: Vascular;  Laterality: Left;  . BRONCHOSCOPY  02/14/2015   for pulm hemorrhage  . CARDIAC CATHETERIZATION N/A 03/04/2016   Procedure: Left Heart Cath and Coronary Angiography;  Surgeon: Leonie Man, MD;  Location: [place] CV LAB;  Service: Cardiovascular;  Laterality: N/A;  . COLONOSCOPY WITH PROPOFOL N/A 04/07/2017   Procedure: COLONOSCOPY WITH PROPOFOL;  Surgeon: Ronnette Juniper, MD;  Location: [place];  Service: Gastroenterology;  Laterality: N/A;  . EYE SURGERY Left   . INSERTION OF DIALYSIS CATHETER Right 11/08/2016   Procedure: INSERTION OF DIALYSIS CATHETER;  Surgeon: Rosetta Posner, MD;  Location: [place];  Service: Vascular;  Laterality: Right;  . IR PARACENTESIS  11/13/2016  . LIGATION OF ARTERIOVENOUS  FISTULA Left 07/18/2017   Procedure: LIGATION OF ARTERIOVENOUS  FISTULA;  Surgeon: Conrad [place], MD;  Location: [place];  Service: Vascular;  Laterality: Left;  . PERIPHERAL VASCULAR BALLOON ANGIOPLASTY Left 11/27/2016   Procedure: Peripheral Vascular Balloon Angioplasty;  Surgeon: Waynetta Sandy, MD;  Location: [place] CV LAB;  Service: Cardiovascular;  Laterality: Left;  ARM FISTULA  . PERIPHERAL VASCULAR BALLOON ANGIOPLASTY Left 05/07/2017   Procedure: PERIPHERAL VASCULAR BALLOON ANGIOPLASTY;  Surgeon: Waynetta Sandy, MD;  Location: [place] CV LAB;  Service: Cardiovascular;  Laterality: Left;   Fam HX Family History  Problem Relation Age of Onset  . Diabetes Mother    Social Hx  reports that he quit smoking about 35 years ago. He smoked 0.00 packs per day for 4.00 years. He has never used smokeless tobacco. He reports that he does not drink alcohol or use drugs.  Allergy No Known Allergies Home Meds Prior to Admission medications   Medication Sig Start Date End Date Taking? Authorizing Provider  ACCU-CHEK SOFTCLIX LANCETS lancets Use as instructed 3 times daily before meals and at bedtime. E11.9 06/16/17  Yes Newlin, Charlane Ferretti, MD  amLODipine (NORVASC) 2.5 MG tablet Take 1 tablet (2.5 mg total) by mouth daily. 12/15/17  Yes Charlott Rakes, MD  aspirin 81 MG chewable tablet Chew 1 tablet (81 mg total) by mouth daily. Patient taking differently: Chew 81 mg by mouth every evening.  09/14/15  Yes Angiulli, Lavon Paganini, PA-C  atorvastatin (LIPITOR) 40 MG tablet Take 1 tablet (40 mg total) by mouth daily at 6 PM. 12/15/17  Yes Newlin, Enobong, MD  b complex-vitamin c-folic acid (NEPHRO-VITE) 0.8 MG TABS tablet Take 1 tablet by mouth at bedtime.   Yes [provider]  Blood  Glucose Monitoring Suppl (ACCU-CHEK AVIVA) device Use as instructed 3 times daily before meals and at bedtime. E11.9 05/07/17  Yes Charlott Rakes, MD  Blood Glucose Monitoring Suppl (TRUE METRIX METER) DEVI 1 each by Does not apply route 3 (three) times daily. 05/05/17  Yes Newlin, Charlane Ferretti, MD  carvedilol (COREG) 6.25 MG tablet TAKE 2 TABLETS BY MOUTH 2 TIMES DAILY WITH A MEAL. Patient taking differently: Take 12.5 mg by mouth 2 (two) times daily.  02/16/18  Yes Newlin, Charlane Ferretti, MD  furosemide (LASIX) 80 MG tablet TAKE 1 TABLET BY MOUTH 2 TIMES DAILY. Patient taking differently: Take 80 mg by mouth daily.  12/22/17  Yes Charlott Rakes, MD  gabapentin (NEURONTIN) 300 MG capsule Take 1 capsule (300 mg total) by mouth daily.  12/15/17  Yes Newlin, Charlane Ferretti, MD  glucose blood (ACCU-CHEK AVIVA PLUS) test strip Use 3 times daily before meals and at bedtime. E11.9 06/16/17  Yes Charlott Rakes, MD  insulin aspart (NOVOLOG) 100 UNIT/ML injection 0-12 units 3 times daily before meals as well as per sliding scale 12/15/17  Yes Newlin, Enobong, MD  insulin glargine (LANTUS) 100 UNIT/ML injection Inject subcutaneously 10 units of Lantus in the morning and 15 units of Lantus in the evening Patient taking differently: Inject 10-12 Units into the skin 2 (two) times daily. Inject subcutaneously 10 units of Lantus in the morning and 12 units of Lantus in the evening 12/15/17  Yes Charlott Rakes, MD  Lancet Devices [facility]) lancets Use as instructed 3 times daily before meals and at bedtime. E11.9 05/07/17  Yes Charlott Rakes, MD  Nutritional Supplements (FEEDING SUPPLEMENT, NEPRO CARB STEADY,) LIQD Take 237 mLs by mouth 2 (two) times daily between meals. 11/16/16  Yes Jani Gravel, MD  ondansetron (ZOFRAN) 4 MG tablet Take 1 tablet (4 mg total) by mouth every 8 (eight) hours as needed for nausea or vomiting. 03/14/17  Yes Newlin, Enobong, MD  RENAGEL 800 MG tablet Take 800-1,600 mg by mouth See admin instructions. Take 1600 mg by mouth 3 times daily with meals and take 800 mg by mouth twice daily with snacks 05/23/17  Yes [provider]  timolol (TIMOPTIC) 0.5 % ophthalmic solution Place 1 drop into the left eye 2 (two) times daily.   Yes [provider]  calcitRIOL (ROCALTROL) 0.25 MCG capsule Take 1 capsule (0.25 mcg total) by mouth daily. Patient not taking: Reported on 03/13/2018 07/15/17   Charlott Rakes, MD  Darbepoetin Alfa (ARANESP) 150 MCG/0.3ML SOSY injection Inject 0.3 mLs (150 mcg total) into the vein every Saturday with hemodialysis. Patient not taking: Reported on 12/15/2017 11/16/16   Jani Gravel, MD  hydrocortisone (ANUSOL-HC) 2.5 % rectal cream Place rectally 2 (two) times daily. Patient not taking:  Reported on 12/15/2017 11/16/16   Jani Gravel, MD  oxyCODONE (OXY IR/ROXICODONE) 5 MG immediate release tablet Take 1 tablet (5 mg total) by mouth every 6 (six) hours as needed. Patient not taking: Reported on 12/15/2017 07/22/17   Charlann Lange, PA-C  polyethylene glycol (MIRALAX / GLYCOLAX) packet Take 17 g by mouth daily. Patient not taking: Reported on 03/13/2018 11/16/16   Jani Gravel, MD    Physical Exam: Vitals:   03/13/18 0500 03/13/18 0645 03/13/18 0900 03/13/18 1500  BP: (!) 143/59 133/61 (!) 154/67 (!) 142/61  Pulse: 73 67 61 72  Resp:  '18 16 16  ' Temp:   98.6 F (37 C) 99 F (37.2 C)  TempSrc:   Oral Oral  SpO2: 100% 98% 100% 99%  Constitutional: Appears to be in some distress due to pain. Eyes: Blindness in right eye.  Left eye normal. HENMT: atraumatic. Normocephalic. Hearing grossly intact. No rhinorrhea. MMM. Neck: normal. Supple. No mass. No thyromegaly Resp: normal effort. Good air movement bilaterally.  Clear to auscultation bilaterally anteriorly. CVS:?Irregular, RR. S1 & S2 heard. No murmurs. No edema. GI: normal bowel sound. No tenderness. No distention. MSK: Patient was not able to sit up in bed for exam due to severe pain with movement. Skin: no apparent lesion. Normal warmth.  Neuro: Awake. Alert. Oriented appropriately. CN 2-12 grossly intact.  Motor strength is 2/5 in left lower extremity and 3/5 in right lower extremity with hip flexion.  5/5 with ankle dorsi and plantar flexion bilaterally.  Not able to elicit patellar reflex bilaterally.  Light sensation intact in all dermatomes. Psych: Calm except when in pain  Labs on Admission: I have personally reviewed following labs and imaging studies  CBC: Recent Labs  Lab 03/12/18 1928 03/13/18 1158  WBC 10.4 11.0*  HGB 10.7* 9.9*  HCT 32.6* 31.1*  MCV 92.1 93.1  PLT 95* 166*   Basic Metabolic Panel: Recent Labs  Lab 03/12/18 1928 03/13/18 1158  NA 135  --   K 3.4*  --   CL 93*  --   CO2 25  --     GLUCOSE 228*  --   BUN 22*  --   CREATININE 3.85* 5.74*  CALCIUM 9.0  --    GFR: CrCl cannot be calculated (Unknown ideal weight.). Liver Function Tests: No results for input(s): AST, ALT, ALKPHOS, BILITOT, PROT, ALBUMIN in the last 168 hours. No results for input(s): LIPASE, AMYLASE in the last 168 hours. No results for input(s): AMMONIA in the last 168 hours. Coagulation Profile: No results for input(s): INR, PROTIME in the last 168 hours. Cardiac Enzymes: No results for input(s): CKTOTAL, CKMB, CKMBINDEX, TROPONINI in the last 168 hours. BNP (last 3 results) No results for input(s): PROBNP in the last 8760 hours. HbA1C: Recent Labs    03/13/18 1158  HGBA1C 7.5*   CBG: Recent Labs  Lab 03/13/18 1148  GLUCAP 221*   Lipid Profile: No results for input(s): CHOL, HDL, LDLCALC, TRIG, CHOLHDL, LDLDIRECT in the last 72 hours. Thyroid Function Tests: No results for input(s): TSH, T4TOTAL, FREET4, T3FREE, THYROIDAB in the last 72 hours. Anemia Panel: No results for input(s): VITAMINB12, FOLATE, FERRITIN, TIBC, IRON, RETICCTPCT in the last 72 hours. Urine analysis:    Component Value Date/Time   COLORURINE AMBER (A) 03/12/2018 2339   APPEARANCEUR HAZY (A) 03/12/2018 2339   LABSPEC 1.015 03/12/2018 2339   PHURINE 5.0 03/12/2018 2339   GLUCOSEU NEGATIVE 03/12/2018 2339   HGBUR SMALL (A) 03/12/2018 2339   BILIRUBINUR NEGATIVE 03/12/2018 2339   BILIRUBINUR negative 02/27/2015 1458   BILIRUBINUR neg 02/22/2013 1737   KETONESUR NEGATIVE 03/12/2018 2339   PROTEINUR 100 (A) 03/12/2018 2339   UROBILINOGEN 0.2 02/28/2015 0032   NITRITE NEGATIVE 03/12/2018 2339   LEUKOCYTESUR NEGATIVE 03/12/2018 2339    Sepsis Labs: Lactic acid negative. )No results found for this or any previous visit (from the past 240 hour(s)).   Radiological Exams on Admission: Ct Abdomen Pelvis Wo Contrast  Result Date: 03/13/2018 CLINICAL DATA:  Back pain. Cancer or infection suspected. Dialysis  patient. EXAM: CT ABDOMEN AND PELVIS WITHOUT CONTRAST TECHNIQUE: Multidetector CT imaging of the abdomen and pelvis was performed following the standard protocol without IV contrast. COMPARISON:  Abdominal radiograph 09/02/2015. Renal ultrasound 02/15/2015. No prior CT.  FINDINGS: Lower chest: Hypoventilatory atelectasis right greater than left lung base. Normal heart size. There are coronary artery calcifications. Minimal pericardial fluid without significant effusion. Hepatobiliary: Lack of IV contrast and motion artifact limits assessment. Allowing for these limitations, no focal hepatic abnormality. No gallstones, pericholecystic inflammation, or biliary dilatation. Pancreas: Grossly normal. No ductal dilatation or inflammation. Spleen: Upper normal in size spanning 13.7 cm cranial caudal. Adrenals/Urinary Tract: No adrenal nodule. No hydronephrosis. No significant perinephric edema. Dense renovascular calcifications. Urinary bladder is nondistended. Stomach/Bowel: Lack of enteric contrast and paucity of intra-abdominal fat limits bowel assessment. Stomach is partially distended. No bowel obstruction. No evidence of bowel inflammation or wall thickening. Moderate volume of stool throughout the colon with colonic tortuosity. Small volume of stool in the rectum. Normal appendix. Vascular/Lymphatic: Advanced vascular calcifications of the aorta and branches. No bulky adenopathy allowing for limitations. Reproductive: Prostate is unremarkable. Other: No ascites or free air.  No intra-abdominal fluid collection. Musculoskeletal: Degenerative disc disease at L5-S1. Small Schmorl's node superior endplate of L4. There are no acute or suspicious osseous abnormalities. IMPRESSION: 1. No acute findings in the abdomen/pelvis. No evidence of infection or malignancy on noncontrast exam. 2. Large colonic stool burden can be seen with constipation. 3. Mild splenomegaly, nonspecific. 4. Advanced vascular calcifications. Aortic  Atherosclerosis (ICD10-I70.0). Electronically Signed   By: Keith Rake M.D.   On: 03/13/2018 04:33   Mr Lumbar Spine Wo Contrast  Result Date: 03/13/2018 CLINICAL DATA:  Low back pain radiating to both legs with difficulty walking. History of end-stage renal disease on dialysis. EXAM: MRI LUMBAR SPINE WITHOUT CONTRAST TECHNIQUE: Multiplanar, multisequence MR imaging of the lumbar spine was performed. No intravenous contrast was administered. COMPARISON:  CT abdomen and pelvis 03/13/2018 FINDINGS: Segmentation:  Standard. Alignment: Straightening of the normal lumbar lordosis. Trace retrolisthesis of L3 on L4. Vertebrae: Diminished bone marrow signal intensity diffusely which may be related to patient's history of end-stage renal disease and anemia. No suspicious focal osseous lesion or fracture. Small L4 superior endplate Schmorl's node with primarily chronic surrounding degenerative marrow changes. Conus medullaris and cauda equina: Conus extends to the T12-L1 level. Conus and cauda equina appear normal. Paraspinal and other soft tissues: Unremarkable. Disc levels: Disc desiccation from L3-4 to L5-S1. Mild disc space narrowing at L3-4 and L5-S1. L1-2: Negative. L2-3: Minimal disc bulging most notable in the left extraforaminal region. No stenosis. L3-4: Disc bulging, congenitally short pedicles, and slight facet and ligamentum flavum hypertrophy result in borderline lateral recess and borderline neural foraminal stenosis bilaterally without spinal stenosis. L4-5: Disc bulging and mild facet and ligamentum flavum hypertrophy result in borderline lateral recess and borderline left neural foraminal stenosis without spinal stenosis. A small central annular fissure is noted. L5-S1: Circumferential disc bulging, a left foraminal disc extrusion, and mild facet hypertrophy result in borderline right and severe left neural foraminal stenosis with potential left L5 nerve root impingement. No significant spinal  stenosis. IMPRESSION: 1. Disc degeneration most notable at L5-S1 where there is severe left neural foraminal stenosis due to a disc extrusion. 2. Mild disc and facet degeneration at L3-4 and L4-5 without significant stenosis. Electronically Signed   By: Logan Bores M.D.   On: 03/13/2018 07:59    All images have been reviewed by me personally.   Assessment/Plan Active Problems:   Essential hypertension   DM (diabetes mellitus) with complications (HCC)   Blind right eye   Debility   ESRD (end stage renal disease) ([place])   CAD (coronary artery disease)  Fever   Acute low back pain   Bilateral leg weakness    Low back pain/bilateral leg weakness: Likely due to underlying severe disc degeneration.  MRI lumbar spine showed DDD with severe left neuroforaminal stenosis due to a disc extrusion at L5-S1.  No red flags except for mild fever to 100.7 on arrival.  Neuro exam is somewhat inconsistent, which could be due to pain.  Unclear if this is new or not as he has history of debility and CVA in his chart that patient his wife denies.. -Discussed with neurosurgery (Dr. Alfonso Ramus) who did not think the MRI finding is severe enough for surgery.  Recommended CRP, ESR, and PT eval-ordered -Pain management with Dilaudid and Flexeril-cautiously given ESRD -Continue home gabapentin -Continue antibiotics given fever pending blood cultures  History of CAD status post cath in 02/2016 (no stent)-no anginal symptoms. -Continue home meds  ESRD on HD TTS: Reports good compliance with dialysis. -Nephrology (Dr. Marval Regal) aware about patient -Continue home renal meds  Normocytic anemia: Stable -CBC  Hyperglycemia/DM2-not in DKA.  A1c 7.5% -Sliding scale insulin -Continue home Lantus -CBG ACHS  Hypertension: Normotensive -Continue home meds  Glaucoma/vision loss in right eye -Continue home eyedrops for left eye  DVT prophylaxis: Subcu heparin Code Status: Full code Family Communication: Wife at  bedside Disposition Plan: MedSurg Consults called: Discussed patient with neurosurgery (Dr. Malon Kindle surgical Admission status: Inpatient   Mercy Riding MD [facility] Pager [phone]  If 7PM-7AM, please contact night-coverage [email or web address] Password [email or web address]  03/13/2018, 4:30 PM

## 2018-03-14 ENCOUNTER — Observation Stay (HOSPITAL_BASED_OUTPATIENT_CLINIC_OR_DEPARTMENT_OTHER): Payer: Medicare Other

## 2018-03-14 DIAGNOSIS — E871 Hypo-osmolality and hyponatremia: Secondary | ICD-10-CM | POA: Diagnosis not present

## 2018-03-14 DIAGNOSIS — B954 Other streptococcus as the cause of diseases classified elsewhere: Secondary | ICD-10-CM | POA: Diagnosis not present

## 2018-03-14 DIAGNOSIS — I5030 Unspecified diastolic (congestive) heart failure: Secondary | ICD-10-CM

## 2018-03-14 DIAGNOSIS — D631 Anemia in chronic kidney disease: Secondary | ICD-10-CM | POA: Diagnosis not present

## 2018-03-14 DIAGNOSIS — R7881 Bacteremia: Secondary | ICD-10-CM | POA: Diagnosis present

## 2018-03-14 DIAGNOSIS — I2510 Atherosclerotic heart disease of native coronary artery without angina pectoris: Secondary | ICD-10-CM | POA: Diagnosis not present

## 2018-03-14 DIAGNOSIS — B955 Unspecified streptococcus as the cause of diseases classified elsewhere: Secondary | ICD-10-CM | POA: Diagnosis present

## 2018-03-14 DIAGNOSIS — E118 Type 2 diabetes mellitus with unspecified complications: Secondary | ICD-10-CM

## 2018-03-14 DIAGNOSIS — Z992 Dependence on renal dialysis: Secondary | ICD-10-CM | POA: Diagnosis not present

## 2018-03-14 DIAGNOSIS — K5909 Other constipation: Secondary | ICD-10-CM | POA: Diagnosis not present

## 2018-03-14 DIAGNOSIS — H5461 Unqualified visual loss, right eye, normal vision left eye: Secondary | ICD-10-CM | POA: Diagnosis not present

## 2018-03-14 DIAGNOSIS — N2581 Secondary hyperparathyroidism of renal origin: Secondary | ICD-10-CM | POA: Diagnosis not present

## 2018-03-14 DIAGNOSIS — M4640 Discitis, unspecified, site unspecified: Secondary | ICD-10-CM | POA: Diagnosis not present

## 2018-03-14 DIAGNOSIS — N186 End stage renal disease: Secondary | ICD-10-CM | POA: Diagnosis not present

## 2018-03-14 DIAGNOSIS — E1122 Type 2 diabetes mellitus with diabetic chronic kidney disease: Secondary | ICD-10-CM | POA: Diagnosis not present

## 2018-03-14 DIAGNOSIS — M545 Low back pain: Secondary | ICD-10-CM | POA: Diagnosis not present

## 2018-03-14 DIAGNOSIS — I120 Hypertensive chronic kidney disease with stage 5 chronic kidney disease or end stage renal disease: Secondary | ICD-10-CM | POA: Diagnosis not present

## 2018-03-14 LAB — RENAL FUNCTION PANEL
ALBUMIN: 2.3 g/dL — AB (ref 3.5–5.0)
ANION GAP: 11 (ref 5–15)
BUN: 59 mg/dL — ABNORMAL HIGH (ref 6–20)
CHLORIDE: 95 mmol/L — AB (ref 98–111)
CO2: 25 mmol/L (ref 22–32)
Calcium: 8.2 mg/dL — ABNORMAL LOW (ref 8.9–10.3)
Creatinine, Ser: 7.4 mg/dL — ABNORMAL HIGH (ref 0.61–1.24)
GFR, EST AFRICAN AMERICAN: 8 mL/min — AB (ref >60)
GFR, EST NON AFRICAN AMERICAN: 7 mL/min — AB (ref >60)
Glucose, Bld: 257 mg/dL — ABNORMAL HIGH (ref 70–99)
PHOSPHORUS: 4.3 mg/dL (ref 2.5–4.6)
Potassium: 3.7 mmol/L (ref 3.5–5.1)
Sodium: 131 mmol/L — ABNORMAL LOW (ref 135–145)

## 2018-03-14 LAB — GLUCOSE, CAPILLARY
GLUCOSE-CAPILLARY: 191 mg/dL — AB (ref 70–99)
GLUCOSE-CAPILLARY: 178 mg/dL — AB (ref 70–99)
GLUCOSE-CAPILLARY: 172 mg/dL — AB (ref 70–99)
Glucose-Capillary: 204 mg/dL — ABNORMAL HIGH (ref 70–99)

## 2018-03-14 LAB — URINE CULTURE: Culture: <10000 — AB

## 2018-03-14 LAB — CBC
HEMATOCRIT: 27.5 % — AB (ref 39.0–52.0)
HEMOGLOBIN: 9.1 g/dL — AB (ref 13.0–17.0)
MCH: 30.7 pg (ref 26.0–34.0)
MCHC: 33.1 g/dL (ref 30.0–36.0)
MCV: 92.9 fL (ref 80.0–100.0)
NRBC: 0 % (ref 0.0–0.2)
Platelets: 119 10*3/uL — ABNORMAL LOW (ref 150–400)
RBC: 2.96 MIL/uL — AB (ref 4.22–5.81)
RDW: 14.3 % (ref 11.5–15.5)
WBC: 11.3 10*3/uL — ABNORMAL HIGH (ref 4.0–10.5)

## 2018-03-14 LAB — ECHOCARDIOGRAM COMPLETE

## 2018-03-14 MED ORDER — SORBITOL 70 % SOLN
30.0000 mL | Freq: Every day | Status: DC | PRN
  Administered 2018-03-15 – 2018-03-17 (×2): 30 mL via ORAL
  Filled 2018-03-14 (×2): qty 30

## 2018-03-14 MED ORDER — OXYCODONE HCL 5 MG PO TABS
5.0000 mg | ORAL_TABLET | Freq: Four times a day (QID) | ORAL | Status: DC | PRN
  Administered 2018-03-14 (×2): 5 mg via ORAL
  Filled 2018-03-14: qty 1

## 2018-03-14 MED ORDER — DOXERCALCIFEROL 4 MCG/2ML IV SOLN
INTRAVENOUS | Status: AC
  Filled 2018-03-14: qty 2

## 2018-03-14 MED ORDER — OXYCODONE HCL 5 MG PO TABS
ORAL_TABLET | ORAL | Status: AC
  Filled 2018-03-14: qty 1

## 2018-03-14 MED ORDER — METOPROLOL TARTRATE 5 MG/5ML IV SOLN: 5.0000 mg | Freq: Once | INTRAVENOUS | Status: DC

## 2018-03-14 MED ORDER — DOXERCALCIFEROL 4 MCG/2ML IV SOLN
3.0000 ug | INTRAVENOUS | Status: DC
  Administered 2018-03-14 – 2018-03-17 (×2): 3 ug via INTRAVENOUS
  Filled 2018-03-14 (×4): qty 2

## 2018-03-14 MED ORDER — CEFAZOLIN SODIUM-DEXTROSE 2-4 GM/100ML-% IV SOLN
2.0000 g | INTRAVENOUS | Status: DC
  Administered 2018-03-14 – 2018-03-17 (×2): 2 g via INTRAVENOUS
  Filled 2018-03-14: qty 100
  Filled 2018-03-14: qty 1200
  Filled 2018-03-14: qty 100

## 2018-03-14 MED ORDER — HEPARIN SODIUM (PORCINE) 1000 UNIT/ML DIALYSIS
5000.0000 [IU] | INTRAMUSCULAR | Status: DC | PRN
  Administered 2018-03-14: 5000 [IU] via INTRAVENOUS_CENTRAL
  Filled 2018-03-14: qty 5

## 2018-03-14 MED ORDER — HYDROMORPHONE HCL 1 MG/ML IJ SOLN
INTRAMUSCULAR | Status: AC
  Filled 2018-03-14: qty 0.5

## 2018-03-14 MED ORDER — SODIUM CHLORIDE 0.9 % IV SOLN: 100.0000 mL | INTRAVENOUS | Status: DC | PRN

## 2018-03-14 MED ORDER — HYDROMORPHONE HCL 1 MG/ML IJ SOLN
0.5000 mg | INTRAMUSCULAR | Status: DC | PRN
  Administered 2018-03-14 – 2018-03-21 (×5): 0.5 mg via INTRAVENOUS
  Filled 2018-03-14 (×3): qty 1

## 2018-03-14 MED ORDER — HEPARIN SODIUM (PORCINE) 1000 UNIT/ML IJ SOLN
INTRAMUSCULAR | Status: AC
  Administered 2018-03-14: 5000 [IU] via INTRAVENOUS_CENTRAL
  Filled 2018-03-14: qty 5

## 2018-03-14 MED ORDER — POLYETHYLENE GLYCOL 3350 17 G PO PACK: 17.0000 g | PACK | Freq: Every day | ORAL | Status: DC | PRN

## 2018-03-14 MED ORDER — VANCOMYCIN HCL IN DEXTROSE 750-5 MG/150ML-% IV SOLN
INTRAVENOUS | Status: AC
  Filled 2018-03-14: qty 150

## 2018-03-14 MED ORDER — CHLORHEXIDINE GLUCONATE CLOTH 2 % EX PADS
6.0000 | MEDICATED_PAD | Freq: Every day | CUTANEOUS | Status: DC
  Administered 2018-03-14 – 2018-03-21 (×5): 6 via TOPICAL

## 2018-03-14 MED ORDER — BISACODYL 10 MG RE SUPP: 10.0000 mg | Freq: Every day | RECTAL | Status: DC | PRN

## 2018-03-14 NOTE — Consult Note (Signed)
[facility]  Total days of antibiotics 3        Day 3 cefepime/metro/vanco       Reason for Consult: streptococcal bacteremia ? Possible discitis   Referring Physician: vann  Active Problems:   Essential hypertension   DM (diabetes mellitus) with complications (HCC)   Blind right eye   Debility   ESRD (end stage renal disease) (HCC)   CAD (coronary artery disease)   Fever   Acute low back pain   Bilateral leg weakness   Streptococcal bacteremia    HPI: Russell Price is a 57 y.o. male with HTN, ESRD on HD, hx of CAD s/p PCI, who was admitted on 10/24 with new onset of  low back pain and cramping in the setting of fever of 100.106F noted in the ED. The pain causes him to have difficulty ambulating and notices that it radiates down his legs bilaterally. It also causes him pain as he lays down during HD. On CT, he was found to have degenerative disc disease at L5-S1. Small Schmorl's node superior endplate of L4. Then he underwent MRI -non cont showed Disc degeneration most notable at L5-S1 where there is severe left neural foraminal stenosis due to a disc extrusion. His blood cx from admit have grown strep species in both sets. During hd, he is having still significant back pain ,crying out in discomfort.  Past Medical History:  Diagnosis Date  . Back pain 03/12/2018  . CAD (coronary artery disease)    a. underwent cath in 02/2016 after an abnormal nuc which showed 2V CAD in RCA and OM3 with no good targets for PCI. Medical managment recommended.   . Chronic kidney disease   . Diabetes mellitus without complication ([place])    type II - followed by Dr Chalmers Cater   . Difficult intubation    ER intubation 2016  . ESRD (end stage renal disease) ([place])    Tues, Th, Sat [place]  . Fever 03/12/2018  . Glaucoma   . Hemodialysis patient [facility])    has been on dialysis since approx 10/2016   . Hypertension    followed by [facility]  . Stroke [facility])     patient denies on 02/19/2017     Allergies: No Known Allergies  MEDICATIONS: . amLODipine  2.5 mg Oral Daily  . atorvastatin  40 mg Oral q1800  . carvedilol  6.25 mg Oral BID WC  . Chlorhexidine Gluconate Cloth  6 each Topical Q0600  . doxercalciferol  3 mcg Intravenous Q T,Th,Sa-HD  . feeding supplement (NEPRO CARB STEADY)  237 mL Oral BID BM  . furosemide  80 mg Oral BID  . gabapentin  300 mg Oral Daily  . heparin  5,000 Units Subcutaneous Q8H  . insulin aspart  0-5 Units Subcutaneous QHS  . insulin aspart  0-9 Units Subcutaneous TID WC  . insulin glargine  10 Units Subcutaneous BID  . multivitamin  1 tablet Oral QHS  . sevelamer carbonate  800 mg Oral TID WC  . timolol  1 drop Left Eye BID    Social History   Tobacco Use  . Smoking status: Former Smoker    Packs/day: 0.00    Years: 4.00    Pack years: 0.00    Last attempt to quit: 02/23/1983    Years since quitting: 35.0  . Smokeless tobacco: Never Used  Substance Use Topics  . Alcohol use: No  . Drug use: No  Family History  Problem Relation Age of Onset  . Diabetes Mother      Review of Systems  Constitutional: Negative for fever, chills, diaphoresis, activity change, appetite change, fatigue and unexpected weight change.  HENT: Negative for congestion, sore throat, rhinorrhea, sneezing, trouble swallowing and sinus pressure.  Eyes: Negative for photophobia and visual disturbance.  Respiratory: Negative for cough, chest tightness, shortness of breath, wheezing and stridor.  Cardiovascular: Negative for chest pain, palpitations and leg swelling.  Gastrointestinal: Negative for nausea, vomiting, abdominal pain, diarrhea, constipation, blood in stool, abdominal distention and anal bleeding.  Genitourinary: Negative for dysuria, hematuria, flank pain and difficulty urinating.  Musculoskeletal:  +back pain. Negative for myalgias, joint swelling, arthralgias and gait problem.  Skin: Negative for color change,  pallor, rash and wound.  Neurological: Negative for dizziness, tremors, weakness and light-headedness.  Hematological: Negative for adenopathy. Does not bruise/bleed easily.  Psychiatric/Behavioral: Negative for behavioral problems, confusion, sleep disturbance, dysphoric mood, decreased concentration and agitation.     OBJECTIVE: Temp:  [98.5 F (36.9 C)-99.7 F (37.6 C)] 99.7 F (37.6 C) (10/26 1400) Pulse Rate:  [64-124] 77 (10/26 1500) Resp:  [15-18] 15 (10/26 1400) BP: (132-147)/(52-79) 134/79 (10/26 1500) SpO2:  [91 %-97 %] 96 % (10/26 1400) Weight:  [70.7 kg] 70.7 kg (10/26 1400) Physical Exam  Constitutional: He is oriented to person, place, and time. He appears well-developed and well-nourished. No distress.  HENT: right eye obscured lens 2/2 glaucoma Mouth/Throat: Oropharynx is clear and moist. No oropharyngeal exudate.  Cardiovascular: Normal rate, regular rhythm and normal heart sounds. Exam reveals no gallop and no friction rub.  No murmur heard.  Pulmonary/Chest: Effort normal and breath sounds normal. No respiratory distress. He has no wheezes.  Abdominal: Soft. Bowel sounds are normal. He exhibits no distension. There is no tenderness.  Lymphadenopathy:  He has no cervical adenopathy.  Neurological: He is alert and oriented to person, place, and time.  Skin: Skin is warm and dry. No rash noted. No erythema.  Psychiatric: He has a normal mood and affect. His behavior is normal.     LABS: Results for orders placed or performed during the hospital encounter of 03/12/18 (from the past 48 hour(s))  CBC     Status: Abnormal   Collection Time: 03/12/18  7:28 PM  Result Value Ref Range   WBC 10.4 4.0 - 10.5 K/uL   RBC 3.54 (L) 4.22 - 5.81 MIL/uL   Hemoglobin 10.7 (L) 13.0 - 17.0 g/dL   HCT 32.6 (L) 39.0 - 52.0 %   MCV 92.1 80.0 - 100.0 fL   MCH 30.2 26.0 - 34.0 pg   MCHC 32.8 30.0 - 36.0 g/dL   RDW 13.9 11.5 - 15.5 %   Platelets 95 (L) 150 - 400 K/uL    Comment:  REPEATED TO VERIFY PLATELET COUNT CONFIRMED BY SMEAR Immature Platelet Fraction may be clinically indicated, consider ordering this additional test [id number]    nRBC 0.0 0.0 - 0.2 %    Comment: Performed at [facility], [place] [address], [place],  [address]  Basic metabolic panel     Status: Abnormal   Collection Time: 03/12/18  7:28 PM  Result Value Ref Range   Sodium 135 135 - 145 mmol/L   Potassium 3.4 (L) 3.5 - 5.1 mmol/L   Chloride 93 (L) 98 - 111 mmol/L   CO2 25 22 - 32 mmol/L   Glucose, Bld 228 (H) 70 - 99 mg/dL   BUN 22 (H) 6 -  20 mg/dL   Creatinine, Ser 3.85 (H) 0.61 - 1.24 mg/dL   Calcium 9.0 8.9 - 10.3 mg/dL   GFR calc non Af Amer 16 (L) >60 mL/min   GFR calc Af Amer 18 (L) >60 mL/min    Comment: (NOTE) The eGFR has been calculated using the CKD EPI equation. This calculation has not been validated in all clinical situations. eGFR's persistently <60 mL/min signify possible Chronic Kidney Disease.    Anion gap 17 (H) 5 - 15    Comment: Performed at [facility], [place] [address], [place], [place] [address]  Urinalysis, Routine w reflex microscopic     Status: Abnormal   Collection Time: 03/12/18 11:39 PM  Result Value Ref Range   Color, Urine AMBER (A) YELLOW    Comment: BIOCHEMICALS MAY BE AFFECTED BY COLOR   APPearance HAZY (A) CLEAR   Specific Gravity, Urine 1.015 1.005 - 1.030   pH 5.0 5.0 - 8.0   Glucose, UA NEGATIVE NEGATIVE mg/dL   Hgb urine dipstick SMALL (A) NEGATIVE   Bilirubin Urine NEGATIVE NEGATIVE   Ketones, ur NEGATIVE NEGATIVE mg/dL   Protein, ur 100 (A) NEGATIVE mg/dL   Nitrite NEGATIVE NEGATIVE   Leukocytes, UA NEGATIVE NEGATIVE   RBC / HPF 0-5 0 - 5 RBC/hpf   WBC, UA 0-5 0 - 5 WBC/hpf   Bacteria, UA RARE (A) NONE SEEN   Mucus PRESENT     Comment: Performed at [facility], [address] [place], [place] [address]  Urine Culture     Status: Abnormal   Collection Time: 03/13/18 12:54 AM  Result Value Ref Range    Specimen Description URINE, RANDOM    Special Requests NONE    Culture (A)     <10,000 COLONIES/mL INSIGNIFICANT GROWTH Performed at [facility] [address] [place], [place] [address]    Report Status 03/14/2018 FINAL   Blood culture (routine x 2)     Status: None (Preliminary result)   Collection Time: 03/13/18  1:24 AM  Result Value Ref Range   Specimen Description BLOOD RIGHT FOREARM    Special Requests      BOTTLES DRAWN AEROBIC AND ANAEROBIC Blood Culture results may not be optimal due to an inadequate volume of blood received in culture bottles   Culture  Setup Time      GRAM POSITIVE COCCI IN BOTH AEROBIC AND ANAEROBIC BOTTLES CRITICAL RESULT CALLED TO, READ BACK BY AND VERIFIED WITHKarsten Ro [facility] 03/13/18 2239 JDW Performed at [facility], [place] [address] [place], [place] [address]    Culture GRAM POSITIVE COCCI    Report Status PENDING   Blood Culture ID Panel (Reflexed)     Status: Abnormal   Collection Time: 03/13/18  1:24 AM  Result Value Ref Range   Enterococcus species NOT DETECTED NOT DETECTED   Listeria monocytogenes NOT DETECTED NOT DETECTED   Staphylococcus species NOT DETECTED NOT DETECTED   Staphylococcus aureus (BCID) NOT DETECTED NOT DETECTED   Streptococcus species DETECTED (A) NOT DETECTED    Comment: Not Enterococcus species, Streptococcus agalactiae, Streptococcus pyogenes, or Streptococcus pneumoniae. CRITICAL RESULT CALLED TO, READ BACK BY AND VERIFIED WITH: J LEDFORD [facility] 03/13/18 2239 JDW    Streptococcus agalactiae NOT DETECTED NOT DETECTED   Streptococcus pneumoniae NOT DETECTED NOT DETECTED   Streptococcus pyogenes NOT DETECTED NOT DETECTED   Acinetobacter baumannii NOT DETECTED NOT DETECTED   Enterobacteriaceae species NOT DETECTED NOT DETECTED   Enterobacter cloacae  complex NOT DETECTED NOT DETECTED   Escherichia coli NOT DETECTED NOT DETECTED   Klebsiella oxytoca NOT DETECTED NOT DETECTED   Klebsiella pneumoniae NOT  DETECTED NOT DETECTED   Proteus species NOT DETECTED NOT DETECTED   Serratia marcescens NOT DETECTED NOT DETECTED   Haemophilus influenzae NOT DETECTED NOT DETECTED   Neisseria meningitidis NOT DETECTED NOT DETECTED   Pseudomonas aeruginosa NOT DETECTED NOT DETECTED   Candida albicans NOT DETECTED NOT DETECTED   Candida glabrata NOT DETECTED NOT DETECTED   Candida krusei NOT DETECTED NOT DETECTED   Candida parapsilosis NOT DETECTED NOT DETECTED   Candida tropicalis NOT DETECTED NOT DETECTED  Blood culture (routine x 2)     Status: None (Preliminary result)   Collection Time: 03/13/18  1:34 AM  Result Value Ref Range   Specimen Description BLOOD RIGHT HAND    Special Requests      BOTTLES DRAWN AEROBIC AND ANAEROBIC Blood Culture results may not be optimal due to an inadequate volume of blood received in culture bottles   Culture  Setup Time      IN BOTH AEROBIC AND ANAEROBIC BOTTLES GRAM POSITIVE COCCI CRITICAL RESULT CALLED TO, READ BACK BY AND VERIFIED WITHKarsten Ro [facility] 03/13/18 2239 JDW Performed at [facility], [place] [address] [place], [place] [address]    Culture GRAM POSITIVE COCCI    Report Status PENDING   I-Stat CG4 Lactic Acid, ED     Status: None   Collection Time: 03/13/18  1:34 AM  Result Value Ref Range   Lactic Acid, Venous 1.30 0.5 - 1.9 mmol/L  Glucose, capillary     Status: Abnormal   Collection Time: 03/13/18 11:48 AM  Result Value Ref Range   Glucose-Capillary 221 (H) 70 - 99 mg/dL  C-reactive protein     Status: Abnormal   Collection Time: 03/13/18 11:58 AM  Result Value Ref Range   CRP 22.6 (H) <1.0 mg/dL    Comment: Performed at [facility], [place] [address] [place], [place] [address]  Sedimentation rate     Status: Abnormal   Collection Time: 03/13/18 11:58 AM  Result Value Ref Range   Sed Rate 84 (H) 0 - 16 mm/hr    Comment: Performed at [facility] [address], [place], [place] [address]  Hemoglobin A1c     Status:  Abnormal   Collection Time: 03/13/18 11:58 AM  Result Value Ref Range   Hgb A1c MFr Bld 7.5 (H) 4.8 - 5.6 %    Comment: (NOTE) Pre diabetes:          5.7%-6.4% Diabetes:              >6.4% Glycemic control for   <7.0% adults with diabetes    Mean Plasma Glucose 168.55 mg/dL    Comment: Performed at [facility] [address], [place], [place] [address]  CBC     Status: Abnormal   Collection Time: 03/13/18 11:58 AM  Result Value Ref Range   WBC 11.0 (H) 4.0 - 10.5 K/uL   RBC 3.34 (L) 4.22 - 5.81 MIL/uL   Hemoglobin 9.9 (L) 13.0 - 17.0 g/dL   HCT 31.1 (L) 39.0 - 52.0 %   MCV 93.1 80.0 - 100.0 fL   MCH 29.6 26.0 - 34.0 pg   MCHC 31.8 30.0 - 36.0 g/dL   RDW 14.1 11.5 - 15.5 %   Platelets 105 (L) 150 - 400 K/uL    Comment: REPEATED  TO VERIFY Immature Platelet Fraction may be clinically indicated, consider ordering this additional test [id number] CONSISTENT WITH PREVIOUS RESULT    nRBC 0.0 0.0 - 0.2 %    Comment: Performed at [facility], [place] [address] [place], [place] [address]  Creatinine, serum     Status: Abnormal   Collection Time: 03/13/18 11:58 AM  Result Value Ref Range   Creatinine, Ser 5.74 (H) 0.61 - 1.24 mg/dL   GFR calc non Af Amer 10 (L) >60 mL/min   GFR calc Af Amer 11 (L) >60 mL/min    Comment: (NOTE) The eGFR has been calculated using the CKD EPI equation. This calculation has not been validated in all clinical situations. eGFR's persistently <60 mL/min signify possible Chronic Kidney Disease. Performed at [facility], [place] [address], [place], [place] [address]   Glucose, capillary     Status: Abnormal   Collection Time: 03/13/18  4:44 PM  Result Value Ref Range   Glucose-Capillary 204 (H) 70 - 99 mg/dL  Glucose, capillary     Status: Abnormal   Collection Time: 03/13/18  9:54 PM  Result Value Ref Range   Glucose-Capillary 226 (H) 70 - 99 mg/dL  Glucose, capillary     Status: Abnormal   Collection Time: 03/14/18  7:45 AM    Result Value Ref Range   Glucose-Capillary 191 (H) 70 - 99 mg/dL  Glucose, capillary     Status: Abnormal   Collection Time: 03/14/18 12:30 PM  Result Value Ref Range   Glucose-Capillary 204 (H) 70 - 99 mg/dL  Renal function panel     Status: Abnormal   Collection Time: 03/14/18  2:40 PM  Result Value Ref Range   Sodium 131 (L) 135 - 145 mmol/L   Potassium 3.7 3.5 - 5.1 mmol/L   Chloride 95 (L) 98 - 111 mmol/L   CO2 25 22 - 32 mmol/L   Glucose, Bld 257 (H) 70 - 99 mg/dL   BUN 59 (H) 6 - 20 mg/dL   Creatinine, Ser 7.40 (H) 0.61 - 1.24 mg/dL   Calcium 8.2 (L) 8.9 - 10.3 mg/dL   Phosphorus 4.3 2.5 - 4.6 mg/dL   Albumin 2.3 (L) 3.5 - 5.0 g/dL   GFR calc non Af Amer 7 (L) >60 mL/min   GFR calc Af Amer 8 (L) >60 mL/min    Comment: (NOTE) The eGFR has been calculated using the CKD EPI equation. This calculation has not been validated in all clinical situations. eGFR's persistently <60 mL/min signify possible Chronic Kidney Disease.    Anion gap 11 5 - 15    Comment: Performed at [facility] [address], [place], [place] [address]  CBC     Status: Abnormal   Collection Time: 03/14/18  2:40 PM  Result Value Ref Range   WBC 11.3 (H) 4.0 - 10.5 K/uL   RBC 2.96 (L) 4.22 - 5.81 MIL/uL   Hemoglobin 9.1 (L) 13.0 - 17.0 g/dL   HCT 27.5 (L) 39.0 - 52.0 %   MCV 92.9 80.0 - 100.0 fL   MCH 30.7 26.0 - 34.0 pg   MCHC 33.1 30.0 - 36.0 g/dL   RDW 14.3 11.5 - 15.5 %   Platelets 119 (L) 150 - 400 K/uL    Comment: REPEATED TO VERIFY SPECIMEN CHECKED FOR CLOTS CONSISTENT WITH PREVIOUS RESULT    nRBC 0.0 0.0 - 0.2 %    Comment: Performed at [facility], [place],  [place] [address]    MICRO:  IMAGING: Ct Abdomen Pelvis Wo Contrast  Result Date: 03/13/2018 CLINICAL DATA:  Back pain. Cancer or infection suspected. Dialysis patient. EXAM: CT ABDOMEN AND PELVIS WITHOUT CONTRAST TECHNIQUE: Multidetector CT imaging of the abdomen and pelvis was performed  following the standard protocol without IV contrast. COMPARISON:  Abdominal radiograph 09/02/2015. Renal ultrasound 02/15/2015. No prior CT. FINDINGS: Lower chest: Hypoventilatory atelectasis right greater than left lung base. Normal heart size. There are coronary artery calcifications. Minimal pericardial fluid without significant effusion. Hepatobiliary: Lack of IV contrast and motion artifact limits assessment. Allowing for these limitations, no focal hepatic abnormality. No gallstones, pericholecystic inflammation, or biliary dilatation. Pancreas: Grossly normal. No ductal dilatation or inflammation. Spleen: Upper normal in size spanning 13.7 cm cranial caudal. Adrenals/Urinary Tract: No adrenal nodule. No hydronephrosis. No significant perinephric edema. Dense renovascular calcifications. Urinary bladder is nondistended. Stomach/Bowel: Lack of enteric contrast and paucity of intra-abdominal fat limits bowel assessment. Stomach is partially distended. No bowel obstruction. No evidence of bowel inflammation or wall thickening. Moderate volume of stool throughout the colon with colonic tortuosity. Small volume of stool in the rectum. Normal appendix. Vascular/Lymphatic: Advanced vascular calcifications of the aorta and branches. No bulky adenopathy allowing for limitations. Reproductive: Prostate is unremarkable. Other: No ascites or free air.  No intra-abdominal fluid collection. Musculoskeletal: Degenerative disc disease at L5-S1. Small Schmorl's node superior endplate of L4. There are no acute or suspicious osseous abnormalities. IMPRESSION: 1. No acute findings in the abdomen/pelvis. No evidence of infection or malignancy on noncontrast exam. 2. Large colonic stool burden can be seen with constipation. 3. Mild splenomegaly, nonspecific. 4. Advanced vascular calcifications. Aortic Atherosclerosis (ICD10-I70.0). Electronically Signed   By: Keith Rake M.D.   On: 03/13/2018 04:33   Mr Lumbar Spine Wo  Contrast  Result Date: 03/13/2018 CLINICAL DATA:  Low back pain radiating to both legs with difficulty walking. History of end-stage renal disease on dialysis. EXAM: MRI LUMBAR SPINE WITHOUT CONTRAST TECHNIQUE: Multiplanar, multisequence MR imaging of the lumbar spine was performed. No intravenous contrast was administered. COMPARISON:  CT abdomen and pelvis 03/13/2018 FINDINGS: Segmentation:  Standard. Alignment: Straightening of the normal lumbar lordosis. Trace retrolisthesis of L3 on L4. Vertebrae: Diminished bone marrow signal intensity diffusely which may be related to patient's history of end-stage renal disease and anemia. No suspicious focal osseous lesion or fracture. Small L4 superior endplate Schmorl's node with primarily chronic surrounding degenerative marrow changes. Conus medullaris and cauda equina: Conus extends to the T12-L1 level. Conus and cauda equina appear normal. Paraspinal and other soft tissues: Unremarkable. Disc levels: Disc desiccation from L3-4 to L5-S1. Mild disc space narrowing at L3-4 and L5-S1. L1-2: Negative. L2-3: Minimal disc bulging most notable in the left extraforaminal region. No stenosis. L3-4: Disc bulging, congenitally short pedicles, and slight facet and ligamentum flavum hypertrophy result in borderline lateral recess and borderline neural foraminal stenosis bilaterally without spinal stenosis. L4-5: Disc bulging and mild facet and ligamentum flavum hypertrophy result in borderline lateral recess and borderline left neural foraminal stenosis without spinal stenosis. A small central annular fissure is noted. L5-S1: Circumferential disc bulging, a left foraminal disc extrusion, and mild facet hypertrophy result in borderline right and severe left neural foraminal stenosis with potential left L5 nerve root impingement. No significant spinal stenosis. IMPRESSION: 1. Disc degeneration most notable at L5-S1 where there is severe left neural foraminal stenosis due to a disc  extrusion. 2. Mild disc and facet degeneration at L3-4 and L4-5 without significant stenosis.  Electronically Signed   By: Logan Bores M.D.   On: 03/13/2018 07:59   Assessment/Plan:  57yo M with ESRD on HD found to have new onset back pain with fever and found to have streptococcal bacteremia  - can narrow abtx to cefazolin post HD - recommend getting TTE to see if any signs of endocarditis - repeat blood cx tomorrow to document clearance of bacteremia -back pain thought to be 2/2 severe foraminal stenosis -not clear if this infected.

## 2018-03-14 NOTE — Progress Notes (Cosign Needed)
0.5mg  of dilaudid administered to pt by Sharrie Rothman, RN.  This RN wasted 0.5mg  of dilaudid in [facility] Med room sharps container. Witness by Sharrie Rothman, RN.

## 2018-03-14 NOTE — Progress Notes (Signed)
  Echocardiogram 2D Echocardiogram has been performed.  Johny Chess 03/14/2018, 10:35 AM

## 2018-03-14 NOTE — Progress Notes (Signed)
Progress Note    GILMORE LIST  GGY:[id number] DOB: 1960/06/06  DOA: 03/12/2018 PCP: Charlott Rakes, MD    Brief Narrative:     Medical records reviewed and are as summarized below:  Russell Price is an 57 y.o. male with medical history significant for ESRD on TTS, hypertension, DM 2, CAD status post CAD (no stent) in 02/2016, glaucoma and blindness in the right eye who presented to ED with low back pain and bilateral leg weakness.  History provided by patient and patient's wife at bedside. Patient reports spontaneous onset of bilateral low back pain 2 days ago.  Denies history of trauma, injury or unusual activity.  Never had similar pain before.  Describes the pain as achy and spastic.  Pain severity 8/10 before arrival and now.  No radiation to his legs.  Pain is worse with movement.  No alleviating factor.  Reports bilateral leg weakness and difficulty walking for the last 2 days.  Denies numbness, tingling or saddle anesthesia..  Denies changes in regards to bowel or bladder habits.   Assessment/Plan:   Active Problems:   Essential hypertension   DM (diabetes mellitus) with complications (HCC)   Blind right eye   Debility   ESRD (end stage renal disease) (HCC)   CAD (coronary artery disease)   Fever   Acute low back pain   Bilateral leg weakness  Strep bacteremia -ID consult -IV Abx   Low back pain -MRI w/o contrast shows stenosis with dsic extrusion at L5/S1 -discussed MRI with Dr. Nevada Crane (radiology) who does not recommend MRI with contrast due to renal failure.  He reviewed and does not see any indication of infection.  But if worsening back pain, can repeat MRI w/o contrast. -NS consult appreciated -PT consult -pain is uncontrolled-- will add long acting pain medications for now with close monitoring as renal failure, PO PRN short acting and IV refractory pain medication  ESRD -HD T/Th/Sat  DM2 -SSI -lantus  HTN -resume home meds  Constipation -bowel  regimen -PRN PO and suppositories  Patient has uncontrolled pain, bacteremia for which treatment/work up with pass 2 midnight so will change to inpatient status.    Family Communication/Anticipated D/C date and plan/Code Status   DVT prophylaxis: heparin Code Status: Full Code.  Family Communication: wife at bedside Disposition Plan:    Medical Consultants:    ID  Nephrology  NS     Subjective:   Continues to have severe pain  Objective:    Vitals:   03/13/18 0900 03/13/18 1500 03/13/18 2359 03/14/18 0649  BP: (!) 154/67 (!) 142/61 (!) 143/60 (!) 147/52  Pulse: 61 72 69 (!) 124  Resp: 16 16 18 16   Temp: 98.6 F (37 C) 99 F (37.2 C) 98.9 F (37.2 C) 98.5 F (36.9 C)  TempSrc: Oral Oral Oral Oral  SpO2: 100% 99% 91% 97%    Intake/Output Summary (Last 24 hours) at 03/14/2018 1008 Last data filed at 03/14/2018 0400 Gross per 24 hour  Intake 867.18 ml  Output -  Net 867.18 ml   There were no vitals filed for this visit.  Exam: uncomfortable appearing No increased work of breathing Fast rate?rhythm No LE Edema Film over right eye (blind) +BS, soft    Data Reviewed:   I have personally reviewed following labs and imaging studies:  Labs: Labs show the following:   Basic Metabolic Panel: Recent Labs  Lab 03/12/18 1928 03/13/18 1158  NA 135  --   K  3.4*  --   CL 93*  --   CO2 25  --   GLUCOSE 228*  --   BUN 22*  --   CREATININE 3.85* 5.74*  CALCIUM 9.0  --    GFR CrCl cannot be calculated (Unknown ideal weight.). Liver Function Tests: No results for input(s): AST, ALT, ALKPHOS, BILITOT, PROT, ALBUMIN in the last 168 hours. No results for input(s): LIPASE, AMYLASE in the last 168 hours. No results for input(s): AMMONIA in the last 168 hours. Coagulation profile No results for input(s): INR, PROTIME in the last 168 hours.  CBC: Recent Labs  Lab 03/12/18 1928 03/13/18 1158  WBC 10.4 11.0*  HGB 10.7* 9.9*  HCT 32.6* 31.1*  MCV  92.1 93.1  PLT 95* 105*   Cardiac Enzymes: No results for input(s): CKTOTAL, CKMB, CKMBINDEX, TROPONINI in the last 168 hours. BNP (last 3 results) No results for input(s): PROBNP in the last 8760 hours. CBG: Recent Labs  Lab 03/13/18 1148 03/13/18 1644 03/13/18 2154 03/14/18 0745  GLUCAP 221* 204* 226* 191*   D-Dimer: No results for input(s): DDIMER in the last 72 hours. Hgb A1c: Recent Labs    03/13/18 1158  HGBA1C 7.5*   Lipid Profile: No results for input(s): CHOL, HDL, LDLCALC, TRIG, CHOLHDL, LDLDIRECT in the last 72 hours. Thyroid function studies: No results for input(s): TSH, T4TOTAL, T3FREE, THYROIDAB in the last 72 hours.  Invalid input(s): FREET3 Anemia work up: No results for input(s): VITAMINB12, FOLATE, FERRITIN, TIBC, IRON, RETICCTPCT in the last 72 hours. Sepsis Labs: Recent Labs  Lab 03/12/18 1928 03/13/18 0134 03/13/18 1158  WBC 10.4  --  11.0*  LATICACIDVEN  --  1.30  --     Microbiology Recent Results (from the past 240 hour(s))  Urine Culture     Status: Abnormal   Collection Time: 03/13/18 12:54 AM  Result Value Ref Range Status   Specimen Description URINE, RANDOM  Final   Special Requests NONE  Final   Culture (A)  Final    <10,000 COLONIES/mL INSIGNIFICANT GROWTH Performed at [facility], [address], [place], [place] [address]    Report Status 03/14/2018 FINAL  Final  Blood culture (routine x 2)     Status: None (Preliminary result)   Collection Time: 03/13/18  1:24 AM  Result Value Ref Range Status   Specimen Description BLOOD RIGHT FOREARM  Final   Special Requests   Final    BOTTLES DRAWN AEROBIC AND ANAEROBIC Blood Culture results may not be optimal due to an inadequate volume of blood received in culture bottles   Culture  Setup Time   Final    GRAM POSITIVE COCCI IN BOTH AEROBIC AND ANAEROBIC BOTTLES CRITICAL RESULT CALLED TO, READ BACK BY AND VERIFIED WITHLenna Sciara [facility] 03/13/18 2239 JDW Performed at [facility], [place] [address] [place], [place] [address]    Culture GRAM POSITIVE COCCI  Final   Report Status PENDING  Incomplete  Blood Culture ID Panel (Reflexed)     Status: Abnormal   Collection Time: 03/13/18  1:24 AM  Result Value Ref Range Status   Enterococcus species NOT DETECTED NOT DETECTED Final   Listeria monocytogenes NOT DETECTED NOT DETECTED Final   Staphylococcus species NOT DETECTED NOT DETECTED Final   Staphylococcus aureus (BCID) NOT DETECTED NOT DETECTED Final   Streptococcus species DETECTED (A) NOT DETECTED Final    Comment: Not Enterococcus species, Streptococcus agalactiae, Streptococcus pyogenes, or Streptococcus pneumoniae. CRITICAL RESULT CALLED  TO, READ BACK BY AND VERIFIED WITH: J LEDFORD [facility] 03/13/18 2239 JDW    Streptococcus agalactiae NOT DETECTED NOT DETECTED Final   Streptococcus pneumoniae NOT DETECTED NOT DETECTED Final   Streptococcus pyogenes NOT DETECTED NOT DETECTED Final   Acinetobacter baumannii NOT DETECTED NOT DETECTED Final   Enterobacteriaceae species NOT DETECTED NOT DETECTED Final   Enterobacter cloacae complex NOT DETECTED NOT DETECTED Final   Escherichia coli NOT DETECTED NOT DETECTED Final   Klebsiella oxytoca NOT DETECTED NOT DETECTED Final   Klebsiella pneumoniae NOT DETECTED NOT DETECTED Final   Proteus species NOT DETECTED NOT DETECTED Final   Serratia marcescens NOT DETECTED NOT DETECTED Final   Haemophilus influenzae NOT DETECTED NOT DETECTED Final   Neisseria meningitidis NOT DETECTED NOT DETECTED Final   Pseudomonas aeruginosa NOT DETECTED NOT DETECTED Final   Candida albicans NOT DETECTED NOT DETECTED Final   Candida glabrata NOT DETECTED NOT DETECTED Final   Candida krusei NOT DETECTED NOT DETECTED Final   Candida parapsilosis NOT DETECTED NOT DETECTED Final   Candida tropicalis NOT DETECTED NOT DETECTED Final  Blood culture (routine x 2)     Status: None (Preliminary result)   Collection Time: 03/13/18  1:34 AM    Result Value Ref Range Status   Specimen Description BLOOD RIGHT HAND  Final   Special Requests   Final    BOTTLES DRAWN AEROBIC AND ANAEROBIC Blood Culture results may not be optimal due to an inadequate volume of blood received in culture bottles Performed at [facility], [address] [place], [place] [address]    Culture  Setup Time   Final    ANAEROBIC BOTTLE ONLY GRAM POSITIVE COCCI CRITICAL RESULT CALLED TO, READ BACK BY AND VERIFIED WITH: J [facility] 03/13/18 2239 JDW    Culture GRAM POSITIVE COCCI  Final   Report Status PENDING  Incomplete    Procedures and diagnostic studies:  Ct Abdomen Pelvis Wo Contrast  Result Date: 03/13/2018 CLINICAL DATA:  Back pain. Cancer or infection suspected. Dialysis patient. EXAM: CT ABDOMEN AND PELVIS WITHOUT CONTRAST TECHNIQUE: Multidetector CT imaging of the abdomen and pelvis was performed following the standard protocol without IV contrast. COMPARISON:  Abdominal radiograph 09/02/2015. Renal ultrasound 02/15/2015. No prior CT. FINDINGS: Lower chest: Hypoventilatory atelectasis right greater than left lung base. Normal heart size. There are coronary artery calcifications. Minimal pericardial fluid without significant effusion. Hepatobiliary: Lack of IV contrast and motion artifact limits assessment. Allowing for these limitations, no focal hepatic abnormality. No gallstones, pericholecystic inflammation, or biliary dilatation. Pancreas: Grossly normal. No ductal dilatation or inflammation. Spleen: Upper normal in size spanning 13.7 cm cranial caudal. Adrenals/Urinary Tract: No adrenal nodule. No hydronephrosis. No significant perinephric edema. Dense renovascular calcifications. Urinary bladder is nondistended. Stomach/Bowel: Lack of enteric contrast and paucity of intra-abdominal fat limits bowel assessment. Stomach is partially distended. No bowel obstruction. No evidence of bowel inflammation or wall thickening. Moderate volume of  stool throughout the colon with colonic tortuosity. Small volume of stool in the rectum. Normal appendix. Vascular/Lymphatic: Advanced vascular calcifications of the aorta and branches. No bulky adenopathy allowing for limitations. Reproductive: Prostate is unremarkable. Other: No ascites or free air.  No intra-abdominal fluid collection. Musculoskeletal: Degenerative disc disease at L5-S1. Small Schmorl's node superior endplate of L4. There are no acute or suspicious osseous abnormalities. IMPRESSION: 1. No acute findings in the abdomen/pelvis. No evidence of infection or malignancy on noncontrast exam. 2. Large colonic stool burden can be seen with constipation. 3. Mild  splenomegaly, nonspecific. 4. Advanced vascular calcifications. Aortic Atherosclerosis (ICD10-I70.0). Electronically Signed   By: Keith Rake M.D.   On: 03/13/2018 04:33   Mr Lumbar Spine Wo Contrast  Result Date: 03/13/2018 CLINICAL DATA:  Low back pain radiating to both legs with difficulty walking. History of end-stage renal disease on dialysis. EXAM: MRI LUMBAR SPINE WITHOUT CONTRAST TECHNIQUE: Multiplanar, multisequence MR imaging of the lumbar spine was performed. No intravenous contrast was administered. COMPARISON:  CT abdomen and pelvis 03/13/2018 FINDINGS: Segmentation:  Standard. Alignment: Straightening of the normal lumbar lordosis. Trace retrolisthesis of L3 on L4. Vertebrae: Diminished bone marrow signal intensity diffusely which may be related to patient's history of end-stage renal disease and anemia. No suspicious focal osseous lesion or fracture. Small L4 superior endplate Schmorl's node with primarily chronic surrounding degenerative marrow changes. Conus medullaris and cauda equina: Conus extends to the T12-L1 level. Conus and cauda equina appear normal. Paraspinal and other soft tissues: Unremarkable. Disc levels: Disc desiccation from L3-4 to L5-S1. Mild disc space narrowing at L3-4 and L5-S1. L1-2: Negative. L2-3:  Minimal disc bulging most notable in the left extraforaminal region. No stenosis. L3-4: Disc bulging, congenitally short pedicles, and slight facet and ligamentum flavum hypertrophy result in borderline lateral recess and borderline neural foraminal stenosis bilaterally without spinal stenosis. L4-5: Disc bulging and mild facet and ligamentum flavum hypertrophy result in borderline lateral recess and borderline left neural foraminal stenosis without spinal stenosis. A small central annular fissure is noted. L5-S1: Circumferential disc bulging, a left foraminal disc extrusion, and mild facet hypertrophy result in borderline right and severe left neural foraminal stenosis with potential left L5 nerve root impingement. No significant spinal stenosis. IMPRESSION: 1. Disc degeneration most notable at L5-S1 where there is severe left neural foraminal stenosis due to a disc extrusion. 2. Mild disc and facet degeneration at L3-4 and L4-5 without significant stenosis. Electronically Signed   By: Logan Bores M.D.   On: 03/13/2018 07:59    Medications:   . amLODipine  2.5 mg Oral Daily  . atorvastatin  40 mg Oral q1800  . carvedilol  6.25 mg Oral BID WC  . Chlorhexidine Gluconate Cloth  6 each Topical Q0600  . doxercalciferol  3 mcg Intravenous Q T,Th,Sa-HD  . feeding supplement (NEPRO CARB STEADY)  237 mL Oral BID BM  . furosemide  80 mg Oral BID  . gabapentin  300 mg Oral Daily  . heparin  5,000 Units Subcutaneous Q8H  . insulin aspart  0-5 Units Subcutaneous QHS  . insulin aspart  0-9 Units Subcutaneous TID WC  . insulin glargine  10 Units Subcutaneous BID  . multivitamin  1 tablet Oral QHS  . sevelamer carbonate  800 mg Oral TID WC  . timolol  1 drop Left Eye BID   Continuous Infusions: . sodium chloride 10 mL/hr at 03/13/18 1738  . ceFEPime (MAXIPIME) IV 1 g (03/13/18 2203)  . metronidazole 500 mg (03/14/18 0933)  . vancomycin       LOS: 0 days   Geradine Girt  [facility]   *Please refer to [email or web address], password [email or web address] to get updated schedule on who will round on this patient, as hospitalists switch teams weekly. If 7PM-7AM, please contact night-coverage at [email or web address], password [email or web address] for any overnight needs.  03/14/2018, 10:08 AM

## 2018-03-14 NOTE — Progress Notes (Signed)
Pharmacy Antibiotic Note  Russell Price is a 57 y.o. male with ESRD and HD TTSat admitted on 03/12/2018 with possible sepsis.  He was started on vanc/cefepime/flagyl. He has grown out strep species in his blood. ID recommended de-escalating to cefazolin.   Plan: Dc vanc/cefepime/flagyl Ancef 2g IV TTS  Weight: 155 lb 13.8 oz (70.7 kg)  Temp (24hrs), Avg:99 F (37.2 C), Min:98.5 F (36.9 C), Max:99.7 F (37.6 C)  Recent Labs  Lab 03/12/18 1928 03/13/18 0134 03/13/18 1158 03/14/18 1440  WBC 10.4  --  11.0* 11.3*  CREATININE 3.85*  --  5.74* 7.40*  LATICACIDVEN  --  1.30  --   --     Estimated Creatinine Clearance: 10.7 mL/min (A) (by C-G formula based on SCr of 7.4 mg/dL (H)).    No Known Allergies   Onnie Boer, PharmD, [place], AAHIVP, CPP Infectious Disease Pharmacist Pager: [phone] 03/14/2018 4:28 PM

## 2018-03-14 NOTE — Consult Note (Addendum)
[facility] Renal Consultation Note    Indication for Consultation:  Management of ESRD/hemodialysis; anemia, hypertension/volume and secondary hyperparathyroidism  ZOX:WRUEAV, Enobong, MD  HPI: Russell Price is a 57 y.o. male. ESRD 2/2 DM on HD TTS at [facility], first starting in 10/2016.  Past medical history significant for CAD, DMT2 w/neuropathy, HTN, dCHF (EF 55%), thrombocytopenia (HIT negative), Hx ascites, glaucoma and blindness in R eye.  Patient seen and examined at bedside during dialysis.  Reports worsening back pain and lower extremity weakness over the last few days.  Denies trauma or injury.  States it came on suddenly while he was "doing nothing."  Denies radiation, numbness saddle anesthesia or tingling in legs, also has never experienced this pain before.  Continues to have pain now.  Denies SOB, CP, n/v/d, fever and chills.    Pertinent findings since admission include BC +strep, fever 100.45F on admission, sed rate 85, CRP 22.6, CT abdomen showing constipation, MRI spine w/o contrast showing disc degeneration at L5-S1 w/severe neural foraminal stenosis due to disc extrusion.  Patient has been admitted for further evaluation and management.    Past Medical History:  Diagnosis Date  . Back pain 03/12/2018  . CAD (coronary artery disease)    a. underwent cath in 02/2016 after an abnormal nuc which showed 2V CAD in RCA and OM3 with no good targets for PCI. Medical managment recommended.   . Chronic kidney disease   . Diabetes mellitus without complication ([place])    type II - followed by Dr Chalmers Cater   . Difficult intubation    ER intubation 2016  . ESRD (end stage renal disease) ([place])    Tues, Th, Sat [place]  . Fever 03/12/2018  . Glaucoma   . Hemodialysis patient [facility])    has been on dialysis since approx 10/2016   . Hypertension    followed by [facility]  . Stroke [facility])    patient denies on 02/19/2017    Past Surgical History:   Procedure Laterality Date  . A/V FISTULAGRAM Left 11/27/2016   Procedure: A/V Fistulagram;  Surgeon: Waynetta Sandy, MD;  Location: [place] CV LAB;  Service: Cardiovascular;  Laterality: Left;  . A/V FISTULAGRAM Left 05/07/2017   Procedure: A/V FISTULAGRAM;  Surgeon: Waynetta Sandy, MD;  Location: [place] CV LAB;  Service: Cardiovascular;  Laterality: Left;  . A/V FISTULAGRAM Left 07/14/2017   Procedure: A/V FISTULAGRAM;  Surgeon: Waynetta Sandy, MD;  Location: [place] [facility];  Service: Cardiovascular;  Laterality: Left;  . AV FISTULA PLACEMENT Left 03/12/2016   Procedure: Left Arm ARTERIOVENOUS (AV) FISTULA CREATION;  Surgeon: Waynetta Sandy, MD;  Location: [facility] OR;  Service: Vascular;  Laterality: Left;  . AV FISTULA PLACEMENT Left 07/18/2017   Procedure: INSERTION OF ARTERIOVENOUS (AV) GORE-TEX GRAFT ARM LEFT UPPER ARM;  Surgeon: Conrad Premont, MD;  Location: [place];  Service: Vascular;  Laterality: Left;  . BASCILIC VEIN TRANSPOSITION Left 05/14/2016   Procedure: SECOND STAGE LEFT BASILIC VEIN TRANSPOSITION;  Surgeon: Waynetta Sandy, MD;  Location: [place];  Service: Vascular;  Laterality: Left;  . BRONCHOSCOPY  02/14/2015   for pulm hemorrhage  . CARDIAC CATHETERIZATION N/A 03/04/2016   Procedure: Left Heart Cath and Coronary Angiography;  Surgeon: Leonie Man, MD;  Location: [place] [facility] LAB;  Service: Cardiovascular;  Laterality: N/A;  . COLONOSCOPY WITH PROPOFOL N/A 04/07/2017   Procedure: COLONOSCOPY WITH PROPOFOL;  Surgeon: Ronnette Juniper, MD;  Location: [facility]  ENDOSCOPY;  Service: Gastroenterology;  Laterality: N/A;  . EYE SURGERY Left   . INSERTION OF DIALYSIS CATHETER Right 11/08/2016   Procedure: INSERTION OF DIALYSIS CATHETER;  Surgeon: Rosetta Posner, MD;  Location: [place];  Service: Vascular;  Laterality: Right;  . IR PARACENTESIS  11/13/2016  . LIGATION OF ARTERIOVENOUS  FISTULA Left 07/18/2017   Procedure: LIGATION OF  ARTERIOVENOUS  FISTULA;  Surgeon: Conrad Sunshine, MD;  Location: [place];  Service: Vascular;  Laterality: Left;  . PERIPHERAL VASCULAR BALLOON ANGIOPLASTY Left 11/27/2016   Procedure: Peripheral Vascular Balloon Angioplasty;  Surgeon: Waynetta Sandy, MD;  Location: [place] CV LAB;  Service: Cardiovascular;  Laterality: Left;  ARM FISTULA  . PERIPHERAL VASCULAR BALLOON ANGIOPLASTY Left 05/07/2017   Procedure: PERIPHERAL VASCULAR BALLOON ANGIOPLASTY;  Surgeon: Waynetta Sandy, MD;  Location: [place] [facility];  Service: Cardiovascular;  Laterality: Left;   Family History  Problem Relation Age of Onset  . Diabetes Mother    Social History:  reports that he quit smoking about 35 years ago. He smoked 0.00 packs per day for 4.00 years. He has never used smokeless tobacco. He reports that he does not drink alcohol or use drugs. No Known Allergies Prior to Admission medications   Medication Sig Start Date End Date Taking? Authorizing Provider  ACCU-CHEK SOFTCLIX LANCETS lancets Use as instructed 3 times daily before meals and at bedtime. E11.9 06/16/17  Yes Newlin, Charlane Ferretti, MD  amLODipine (NORVASC) 2.5 MG tablet Take 1 tablet (2.5 mg total) by mouth daily. 12/15/17  Yes Charlott Rakes, MD  aspirin 81 MG chewable tablet Chew 1 tablet (81 mg total) by mouth daily. Patient taking differently: Chew 81 mg by mouth every evening.  09/14/15  Yes Angiulli, Lavon Paganini, PA-C  atorvastatin (LIPITOR) 40 MG tablet Take 1 tablet (40 mg total) by mouth daily at 6 PM. 12/15/17  Yes Newlin, Enobong, MD  b complex-vitamin c-folic acid (NEPHRO-VITE) 0.8 MG TABS tablet Take 1 tablet by mouth at bedtime.   Yes [provider]  Blood Glucose Monitoring Suppl (ACCU-CHEK AVIVA) device Use as instructed 3 times daily before meals and at bedtime. E11.9 05/07/17  Yes Charlott Rakes, MD  Blood Glucose Monitoring Suppl (TRUE METRIX METER) DEVI 1 each by Does not apply route 3 (three) times daily.  05/05/17  Yes Newlin, Charlane Ferretti, MD  carvedilol (COREG) 6.25 MG tablet TAKE 2 TABLETS BY MOUTH 2 TIMES DAILY WITH A MEAL. Patient taking differently: Take 12.5 mg by mouth 2 (two) times daily.  02/16/18  Yes Newlin, Charlane Ferretti, MD  furosemide (LASIX) 80 MG tablet TAKE 1 TABLET BY MOUTH 2 TIMES DAILY. Patient taking differently: Take 80 mg by mouth daily.  12/22/17  Yes Charlott Rakes, MD  gabapentin (NEURONTIN) 300 MG capsule Take 1 capsule (300 mg total) by mouth daily. 12/15/17  Yes Newlin, Charlane Ferretti, MD  glucose blood (ACCU-CHEK AVIVA PLUS) test strip Use 3 times daily before meals and at bedtime. E11.9 06/16/17  Yes Charlott Rakes, MD  insulin aspart (NOVOLOG) 100 UNIT/ML injection 0-12 units 3 times daily before meals as well as per sliding scale 12/15/17  Yes Newlin, Enobong, MD  insulin glargine (LANTUS) 100 UNIT/ML injection Inject subcutaneously 10 units of Lantus in the morning and 15 units of Lantus in the evening Patient taking differently: Inject 10-12 Units into the skin 2 (two) times daily. Inject subcutaneously 10 units of Lantus in the morning and 12 units of Lantus in the evening 12/15/17  Yes Charlott Rakes, MD  Lancet Devices (ACCU-CHEK SOFTCLIX) lancets Use as instructed 3 times daily before meals and at bedtime. E11.9 05/07/17  Yes Charlott Rakes, MD  Nutritional Supplements (FEEDING SUPPLEMENT, NEPRO CARB STEADY,) LIQD Take 237 mLs by mouth 2 (two) times daily between meals. 11/16/16  Yes Jani Gravel, MD  ondansetron (ZOFRAN) 4 MG tablet Take 1 tablet (4 mg total) by mouth every 8 (eight) hours as needed for nausea or vomiting. 03/14/17  Yes Newlin, Enobong, MD  RENAGEL 800 MG tablet Take 800-1,600 mg by mouth See admin instructions. Take 1600 mg by mouth 3 times daily with meals and take 800 mg by mouth twice daily with snacks 05/23/17  Yes [provider]  timolol (TIMOPTIC) 0.5 % ophthalmic solution Place 1 drop into the left eye 2 (two) times daily.   Yes [provider]   calcitRIOL (ROCALTROL) 0.25 MCG capsule Take 1 capsule (0.25 mcg total) by mouth daily. Patient not taking: Reported on 03/13/2018 07/15/17   Charlott Rakes, MD  Darbepoetin Alfa (ARANESP) 150 MCG/0.3ML SOSY injection Inject 0.3 mLs (150 mcg total) into the vein every Saturday with hemodialysis. Patient not taking: Reported on 12/15/2017 11/16/16   Jani Gravel, MD  hydrocortisone (ANUSOL-HC) 2.5 % rectal cream Place rectally 2 (two) times daily. Patient not taking: Reported on 12/15/2017 11/16/16   Jani Gravel, MD  oxyCODONE (OXY IR/ROXICODONE) 5 MG immediate release tablet Take 1 tablet (5 mg total) by mouth every 6 (six) hours as needed. Patient not taking: Reported on 12/15/2017 07/22/17   Charlann Lange, PA-C  polyethylene glycol (MIRALAX / GLYCOLAX) packet Take 17 g by mouth daily. Patient not taking: Reported on 03/13/2018 11/16/16   Jani Gravel, MD   Current Facility-Administered Medications  Medication Dose Route Frequency Provider Last Rate Last Dose  . 0.9 %  sodium chloride infusion   Intravenous PRN Mercy Riding, MD 10 mL/hr at 03/13/18 1738    . 0.9 %  sodium chloride infusion  100 mL Intravenous PRN Penninger, Ria Comment, PA      . 0.9 %  sodium chloride infusion  100 mL Intravenous PRN Penninger, Ria Comment, PA      . amLODipine (NORVASC) tablet 2.5 mg  2.5 mg Oral Daily Wendee Beavers T, MD   2.5 mg at 03/14/18 0912  . atorvastatin (LIPITOR) tablet 40 mg  40 mg Oral q1800 Wendee Beavers T, MD   40 mg at 03/13/18 1731  . bisacodyl (DULCOLAX) suppository 10 mg  10 mg Rectal Daily PRN Eulogio Bear U, DO      . carvedilol (COREG) tablet 6.25 mg  6.25 mg Oral BID WC Wendee Beavers T, MD   6.25 mg at 03/14/18 0912  . ceFEPIme (MAXIPIME) 1 g in sodium chloride 0.9 % 100 mL IVPB  1 g Intravenous Q24H Wendee Beavers T, MD 200 mL/hr at 03/13/18 2203 1 g at 03/13/18 2203  . Chlorhexidine Gluconate Cloth 2 % PADS 6 each  6 each Topical Q0600 Penninger, [place], [place]   6 each at 03/14/18 0913  . cyclobenzaprine  (FLEXERIL) tablet 10 mg  10 mg Oral TID PRN Mercy Riding, MD   10 mg at 03/13/18 1732  . doxercalciferol (HECTOROL) injection 3 mcg  3 mcg Intravenous Q T,Th,Sa-HD Penninger, [place], PA      . feeding supplement (NEPRO CARB STEADY) liquid 237 mL  237 mL Oral BID BM Gonfa, Taye T, MD   237 mL at 03/14/18 1240  . furosemide (LASIX) tablet 80 mg  80 mg Oral BID Gonfa,  Charlesetta Ivory, MD   80 mg at 03/14/18 0912  . gabapentin (NEURONTIN) capsule 300 mg  300 mg Oral Daily Wendee Beavers T, MD   300 mg at 03/14/18 0913  . heparin injection 5,000 Units  5,000 Units Subcutaneous Q8H Wendee Beavers T, MD   5,000 Units at 03/14/18 1307  . heparin injection 5,000 Units  5,000 Units Dialysis PRN Penninger, Ria Comment, PA   5,000 Units at 03/14/18 1459  . HYDROmorphone (DILAUDID) injection 0.5 mg  0.5 mg Intravenous Q4H PRN Eliseo Squires, Jessica U, DO      . insulin aspart (novoLOG) injection 0-5 Units  0-5 Units Subcutaneous QHS Mercy Riding, MD   2 Units at 03/13/18 2209  . insulin aspart (novoLOG) injection 0-9 Units  0-9 Units Subcutaneous TID WC Mercy Riding, MD   3 Units at 03/14/18 1306  . insulin glargine (LANTUS) injection 10 Units  10 Units Subcutaneous BID Mercy Riding, MD   10 Units at 03/14/18 0911  . metroNIDAZOLE (FLAGYL) IVPB 500 mg  500 mg Intravenous Q8H Gonfa, Taye T, MD 100 mL/hr at 03/14/18 0933 500 mg at 03/14/18 0933  . multivitamin (RENA-VIT) tablet 1 tablet  1 tablet Oral QHS Mercy Riding, MD   1 tablet at 03/13/18 2138  . oxyCODONE (Oxy IR/ROXICODONE) 5 MG immediate release tablet           . oxyCODONE (Oxy IR/ROXICODONE) immediate release tablet 5 mg  5 mg Oral Q6H PRN Eulogio Bear U, DO   5 mg at 03/14/18 1459  . polyethylene glycol (MIRALAX / GLYCOLAX) packet 17 g  17 g Oral Daily PRN Eliseo Squires, Jessica U, DO      . sevelamer carbonate (RENVELA) tablet 800 mg  800 mg Oral TID WC Gonfa, Taye T, MD   800 mg at 03/14/18 1240  . sorbitol 70 % solution 30 mL  30 mL Oral Daily PRN Eliseo Squires, Jessica U, DO      .  timolol (TIMOPTIC) 0.5 % ophthalmic solution 1 drop  1 drop Left Eye BID Mercy Riding, MD   1 drop at 03/14/18 0913  . vancomycin (VANCOCIN) IVPB 750 mg/150 ml premix  750 mg Intravenous Q T,Th,Sa-HD Mercy Riding, MD       Labs: Basic Metabolic Panel: Recent Labs  Lab 03/12/18 1928 03/13/18 1158  NA 135  --   K 3.4*  --   CL 93*  --   CO2 25  --   GLUCOSE 228*  --   BUN 22*  --   CREATININE 3.85* 5.74*  CALCIUM 9.0  --    CBC: Recent Labs  Lab 03/12/18 1928 03/13/18 1158  WBC 10.4 11.0*  HGB 10.7* 9.9*  HCT 32.6* 31.1*  MCV 92.1 93.1  PLT 95* 105*   CBG: Recent Labs  Lab 03/13/18 1148 03/13/18 1644 03/13/18 2154 03/14/18 0745 03/14/18 1230  GLUCAP 221* 204* 226* 191* 204*   Studies/Results: Ct Abdomen Pelvis Wo Contrast  Result Date: 03/13/2018 CLINICAL DATA:  Back pain. Cancer or infection suspected. Dialysis patient. EXAM: CT ABDOMEN AND PELVIS WITHOUT CONTRAST TECHNIQUE: Multidetector CT imaging of the abdomen and pelvis was performed following the standard protocol without IV contrast. COMPARISON:  Abdominal radiograph 09/02/2015. Renal ultrasound 02/15/2015. No prior CT. FINDINGS: Lower chest: Hypoventilatory atelectasis right greater than left lung base. Normal heart size. There are coronary artery calcifications. Minimal pericardial fluid without significant effusion. Hepatobiliary: Lack of IV contrast and motion artifact limits assessment. Allowing for these limitations,  no focal hepatic abnormality. No gallstones, pericholecystic inflammation, or biliary dilatation. Pancreas: Grossly normal. No ductal dilatation or inflammation. Spleen: Upper normal in size spanning 13.7 cm cranial caudal. Adrenals/Urinary Tract: No adrenal nodule. No hydronephrosis. No significant perinephric edema. Dense renovascular calcifications. Urinary bladder is nondistended. Stomach/Bowel: Lack of enteric contrast and paucity of intra-abdominal fat limits bowel assessment. Stomach is  partially distended. No bowel obstruction. No evidence of bowel inflammation or wall thickening. Moderate volume of stool throughout the colon with colonic tortuosity. Small volume of stool in the rectum. Normal appendix. Vascular/Lymphatic: Advanced vascular calcifications of the aorta and branches. No bulky adenopathy allowing for limitations. Reproductive: Prostate is unremarkable. Other: No ascites or free air.  No intra-abdominal fluid collection. Musculoskeletal: Degenerative disc disease at L5-S1. Small Schmorl's node superior endplate of L4. There are no acute or suspicious osseous abnormalities. IMPRESSION: 1. No acute findings in the abdomen/pelvis. No evidence of infection or malignancy on noncontrast exam. 2. Large colonic stool burden can be seen with constipation. 3. Mild splenomegaly, nonspecific. 4. Advanced vascular calcifications. Aortic Atherosclerosis (ICD10-I70.0). Electronically Signed   By: Keith Rake M.D.   On: 03/13/2018 04:33   Mr Lumbar Spine Wo Contrast  Result Date: 03/13/2018 CLINICAL DATA:  Low back pain radiating to both legs with difficulty walking. History of end-stage renal disease on dialysis. EXAM: MRI LUMBAR SPINE WITHOUT CONTRAST TECHNIQUE: Multiplanar, multisequence MR imaging of the lumbar spine was performed. No intravenous contrast was administered. COMPARISON:  CT abdomen and pelvis 03/13/2018 FINDINGS: Segmentation:  Standard. Alignment: Straightening of the normal lumbar lordosis. Trace retrolisthesis of L3 on L4. Vertebrae: Diminished bone marrow signal intensity diffusely which may be related to patient's history of end-stage renal disease and anemia. No suspicious focal osseous lesion or fracture. Small L4 superior endplate Schmorl's node with primarily chronic surrounding degenerative marrow changes. Conus medullaris and cauda equina: Conus extends to the T12-L1 level. Conus and cauda equina appear normal. Paraspinal and other soft tissues: Unremarkable.  Disc levels: Disc desiccation from L3-4 to L5-S1. Mild disc space narrowing at L3-4 and L5-S1. L1-2: Negative. L2-3: Minimal disc bulging most notable in the left extraforaminal region. No stenosis. L3-4: Disc bulging, congenitally short pedicles, and slight facet and ligamentum flavum hypertrophy result in borderline lateral recess and borderline neural foraminal stenosis bilaterally without spinal stenosis. L4-5: Disc bulging and mild facet and ligamentum flavum hypertrophy result in borderline lateral recess and borderline left neural foraminal stenosis without spinal stenosis. A small central annular fissure is noted. L5-S1: Circumferential disc bulging, a left foraminal disc extrusion, and mild facet hypertrophy result in borderline right and severe left neural foraminal stenosis with potential left L5 nerve root impingement. No significant spinal stenosis. IMPRESSION: 1. Disc degeneration most notable at L5-S1 where there is severe left neural foraminal stenosis due to a disc extrusion. 2. Mild disc and facet degeneration at L3-4 and L4-5 without significant stenosis. Electronically Signed   By: Logan Bores M.D.   On: 03/13/2018 07:59    ROS: All others negative except those listed in HPI.  Physical Exam: Vitals:   03/13/18 2359 03/14/18 0649 03/14/18 1215 03/14/18 1400  BP: (!) 143/60 (!) 147/52 (!) 132/58 (!) 142/62  Pulse: 69 (!) 124 71 72  Resp: 18 16 16 15   Temp: 98.9 F (37.2 C) 98.5 F (36.9 C) 98.8 F (37.1 C) 99.7 F (37.6 C)  TempSrc: Oral Oral Oral Oral  SpO2: 91% 97% 97% 96%  Weight:    70.7 kg  General: NAD, chronically ill appearing male, laying in bed Head: NCAT, sclera not icteric, +sclearal erythema, film over R eye, MMM Neck: Supple. No lymphadenopathy Lungs: CTA bilaterally. No wheeze, rales or rhonchi. Breathing is unlabored. Heart: RRR. No murmur, rubs or gallops.  Abdomen: soft, nontender, +BS, no guarding, no rebound tenderness M/S:  Equal strength b/l in  upper and lower extremities.  Lower extremities:trace edema b/l, no ischemic changes, or open wounds  Neuro: AAOx3. Moves all extremities spontaneously. Psych:  Responds to questions appropriately with a normal affect. Dialysis Access: LU AVG cannulated  Dialysis Orders:  TTS - NW  4.25hrs, BFR 400, DFR 800,  EDW 71kg, 2K/ 2.25Ca  Access: LU AVG  Heparin 5000 unit bolus, 1500 mid run Hectorol 3 mcg IV qHD   Assessment/Plan: 1. Strep bacteremia - source unclear. tmax 100.7, BC+. On IV Abx (ancef). ID consulted. 2. Lower back pain - MRI w/o showed stenosis w/disc extrusion @L5 /S1 per radiology no indication of infection. Neurosurgery did not think MRI findings severe enough for surgery but recommend ID consult, and MRI w/contrast due to severity of pt pain. Is miserable here in dialysis 3.  ESRD -  On HD TTS schedule.  HD today per regular schedule. K 3.4. 4.  Hypertension/volume  - BP elevated. On norvasc and coreg.  Expect improvement post HD.  If weights correct, under edw.  Getting under at OP center, will likely need new edw at d/c. BP better in HD  5.  Anemia of CKD - Hgb 9.9. Follow trends. 6.  Secondary Hyperparathyroidism -  Ca at goal. Need to check RFP.  Continue VDRA, binders. Hectorol and renvela  7.  Nutrition - Renal diet w/fluid restrictions once advanced.  8. DMT2  Jen Mow, PA-C [facility] Pager: [phone] 03/14/2018, 3:00 PM   Patient seen and examined, agree with above note with above modifications. Known to me ESRD, DM, CAD who appears compliant- minimal hosps- presenting with back pain and LE sxms- foundto have strep bacteremia and MRI showing disc degeneration- HD today on schedule- back pain per primary and NSG Corliss Parish, MD 03/14/2018

## 2018-03-15 ENCOUNTER — Observation Stay (HOSPITAL_COMMUNITY): Payer: Medicare Other

## 2018-03-15 DIAGNOSIS — E8770 Fluid overload, unspecified: Secondary | ICD-10-CM | POA: Diagnosis present

## 2018-03-15 DIAGNOSIS — E785 Hyperlipidemia, unspecified: Secondary | ICD-10-CM | POA: Diagnosis present

## 2018-03-15 DIAGNOSIS — R300 Dysuria: Secondary | ICD-10-CM | POA: Diagnosis not present

## 2018-03-15 DIAGNOSIS — B955 Unspecified streptococcus as the cause of diseases classified elsewhere: Secondary | ICD-10-CM | POA: Diagnosis not present

## 2018-03-15 DIAGNOSIS — I499 Cardiac arrhythmia, unspecified: Secondary | ICD-10-CM | POA: Diagnosis not present

## 2018-03-15 DIAGNOSIS — R3911 Hesitancy of micturition: Secondary | ICD-10-CM | POA: Diagnosis not present

## 2018-03-15 DIAGNOSIS — R279 Unspecified lack of coordination: Secondary | ICD-10-CM | POA: Diagnosis not present

## 2018-03-15 DIAGNOSIS — R2689 Other abnormalities of gait and mobility: Secondary | ICD-10-CM | POA: Diagnosis not present

## 2018-03-15 DIAGNOSIS — A408 Other streptococcal sepsis: Secondary | ICD-10-CM | POA: Diagnosis not present

## 2018-03-15 DIAGNOSIS — Z743 Need for continuous supervision: Secondary | ICD-10-CM | POA: Diagnosis not present

## 2018-03-15 DIAGNOSIS — N186 End stage renal disease: Secondary | ICD-10-CM | POA: Diagnosis not present

## 2018-03-15 DIAGNOSIS — M48061 Spinal stenosis, lumbar region without neurogenic claudication: Secondary | ICD-10-CM | POA: Diagnosis present

## 2018-03-15 DIAGNOSIS — Z95828 Presence of other vascular implants and grafts: Secondary | ICD-10-CM | POA: Diagnosis not present

## 2018-03-15 DIAGNOSIS — R569 Unspecified convulsions: Secondary | ICD-10-CM | POA: Diagnosis not present

## 2018-03-15 DIAGNOSIS — R509 Fever, unspecified: Secondary | ICD-10-CM | POA: Diagnosis not present

## 2018-03-15 DIAGNOSIS — R7881 Bacteremia: Secondary | ICD-10-CM | POA: Diagnosis present

## 2018-03-15 DIAGNOSIS — G8929 Other chronic pain: Secondary | ICD-10-CM | POA: Diagnosis present

## 2018-03-15 DIAGNOSIS — M545 Low back pain: Secondary | ICD-10-CM | POA: Diagnosis not present

## 2018-03-15 DIAGNOSIS — I2510 Atherosclerotic heart disease of native coronary artery without angina pectoris: Secondary | ICD-10-CM | POA: Diagnosis not present

## 2018-03-15 DIAGNOSIS — M4640 Discitis, unspecified, site unspecified: Secondary | ICD-10-CM | POA: Diagnosis not present

## 2018-03-15 DIAGNOSIS — R41841 Cognitive communication deficit: Secondary | ICD-10-CM | POA: Diagnosis not present

## 2018-03-15 DIAGNOSIS — E1165 Type 2 diabetes mellitus with hyperglycemia: Secondary | ICD-10-CM | POA: Diagnosis present

## 2018-03-15 DIAGNOSIS — H5461 Unqualified visual loss, right eye, normal vision left eye: Secondary | ICD-10-CM | POA: Diagnosis not present

## 2018-03-15 DIAGNOSIS — Z7982 Long term (current) use of aspirin: Secondary | ICD-10-CM | POA: Diagnosis not present

## 2018-03-15 DIAGNOSIS — M6281 Muscle weakness (generalized): Secondary | ICD-10-CM | POA: Diagnosis not present

## 2018-03-15 DIAGNOSIS — M5489 Other dorsalgia: Secondary | ICD-10-CM | POA: Diagnosis not present

## 2018-03-15 DIAGNOSIS — Z992 Dependence on renal dialysis: Secondary | ICD-10-CM | POA: Diagnosis not present

## 2018-03-15 DIAGNOSIS — E871 Hypo-osmolality and hyponatremia: Secondary | ICD-10-CM | POA: Diagnosis not present

## 2018-03-15 DIAGNOSIS — E118 Type 2 diabetes mellitus with unspecified complications: Secondary | ICD-10-CM | POA: Diagnosis not present

## 2018-03-15 DIAGNOSIS — A491 Streptococcal infection, unspecified site: Secondary | ICD-10-CM | POA: Diagnosis not present

## 2018-03-15 DIAGNOSIS — E1122 Type 2 diabetes mellitus with diabetic chronic kidney disease: Secondary | ICD-10-CM | POA: Diagnosis present

## 2018-03-15 DIAGNOSIS — Z833 Family history of diabetes mellitus: Secondary | ICD-10-CM | POA: Diagnosis not present

## 2018-03-15 DIAGNOSIS — R29898 Other symptoms and signs involving the musculoskeletal system: Secondary | ICD-10-CM | POA: Diagnosis not present

## 2018-03-15 DIAGNOSIS — B954 Other streptococcus as the cause of diseases classified elsewhere: Secondary | ICD-10-CM | POA: Diagnosis not present

## 2018-03-15 DIAGNOSIS — Z87891 Personal history of nicotine dependence: Secondary | ICD-10-CM | POA: Diagnosis not present

## 2018-03-15 DIAGNOSIS — I120 Hypertensive chronic kidney disease with stage 5 chronic kidney disease or end stage renal disease: Secondary | ICD-10-CM | POA: Diagnosis not present

## 2018-03-15 DIAGNOSIS — E876 Hypokalemia: Secondary | ICD-10-CM | POA: Diagnosis present

## 2018-03-15 DIAGNOSIS — M5440 Lumbago with sciatica, unspecified side: Secondary | ICD-10-CM | POA: Diagnosis not present

## 2018-03-15 DIAGNOSIS — K5909 Other constipation: Secondary | ICD-10-CM | POA: Diagnosis not present

## 2018-03-15 DIAGNOSIS — H409 Unspecified glaucoma: Secondary | ICD-10-CM | POA: Diagnosis present

## 2018-03-15 DIAGNOSIS — N2581 Secondary hyperparathyroidism of renal origin: Secondary | ICD-10-CM | POA: Diagnosis not present

## 2018-03-15 DIAGNOSIS — M4807 Spinal stenosis, lumbosacral region: Secondary | ICD-10-CM | POA: Diagnosis present

## 2018-03-15 DIAGNOSIS — D631 Anemia in chronic kidney disease: Secondary | ICD-10-CM | POA: Diagnosis not present

## 2018-03-15 LAB — CULTURE, BLOOD (ROUTINE X 2)

## 2018-03-15 LAB — GLUCOSE, CAPILLARY
GLUCOSE-CAPILLARY: 216 mg/dL — AB (ref 70–99)
Glucose-Capillary: 132 mg/dL — ABNORMAL HIGH (ref 70–99)
Glucose-Capillary: 136 mg/dL — ABNORMAL HIGH (ref 70–99)
Glucose-Capillary: 156 mg/dL — ABNORMAL HIGH (ref 70–99)

## 2018-03-15 MED ORDER — OXYCODONE HCL ER 10 MG PO T12A: 10.0000 mg | EXTENDED_RELEASE_TABLET | Freq: Two times a day (BID) | ORAL | Status: DC

## 2018-03-15 MED ORDER — SENNOSIDES-DOCUSATE SODIUM 8.6-50 MG PO TABS
2.0000 | ORAL_TABLET | Freq: Once | ORAL | Status: AC
  Administered 2018-03-15: 2 via ORAL
  Filled 2018-03-15: qty 2

## 2018-03-15 MED ORDER — ACETAMINOPHEN 325 MG PO TABS
650.0000 mg | ORAL_TABLET | Freq: Four times a day (QID) | ORAL | Status: DC | PRN
  Administered 2018-03-15: 650 mg via ORAL
  Filled 2018-03-15: qty 2

## 2018-03-15 MED ORDER — DARBEPOETIN ALFA 40 MCG/0.4ML IJ SOSY
40.0000 ug | PREFILLED_SYRINGE | INTRAMUSCULAR | Status: DC
  Administered 2018-03-17: 40 ug via INTRAVENOUS
  Filled 2018-03-15: qty 0.4

## 2018-03-15 MED ORDER — OXYCODONE HCL 5 MG PO TABS
5.0000 mg | ORAL_TABLET | Freq: Four times a day (QID) | ORAL | Status: DC | PRN
  Administered 2018-03-15: 5 mg via ORAL
  Administered 2018-03-16 – 2018-03-18 (×5): 10 mg via ORAL
  Administered 2018-03-19 (×2): 5 mg via ORAL
  Administered 2018-03-20: 10 mg via ORAL
  Administered 2018-03-20: 5 mg via ORAL
  Administered 2018-03-21: 10 mg via ORAL
  Filled 2018-03-15 (×2): qty 1
  Filled 2018-03-15 (×5): qty 2
  Filled 2018-03-15: qty 1
  Filled 2018-03-15: qty 2

## 2018-03-15 MED ORDER — ACETAMINOPHEN 500 MG PO TABS
1000.0000 mg | ORAL_TABLET | Freq: Three times a day (TID) | ORAL | Status: DC | PRN
  Administered 2018-03-15 – 2018-03-20 (×4): 1000 mg via ORAL
  Filled 2018-03-15 (×4): qty 2

## 2018-03-15 NOTE — Progress Notes (Addendum)
[facility] Progress Note   Subjective:    HD yest- removed 750- was in pain. Seen at bedside.  Feeling a little better today. Pain under better control currently.  No new complaints. Wanted to talk to me about all of their SW needs- apparently does not qualify for transplant- their assistance has been cut and wife cannot work ?   Objective Vitals:   03/14/18 1730 03/14/18 1816 03/15/18 0635 03/15/18 1221  BP: (!) 150/60 (!) 160/70 (!) 162/70 (!) 175/72  Pulse: 78 82 78 72  Resp:  20 18 18   Temp:  98 F (36.7 C) 100 F (37.8 C) (!) 100.9 F (38.3 C)  TempSrc:  Oral Oral Oral  SpO2:  98% 94% 97%  Weight:  70 kg     Physical Exam General:NAD Heart:RRR, no mrg Lungs:CTAB  Abdomen:soft, NTNF Extremities:no LE edema Dialysis Access: LU AVG +b/t   Filed Weights   03/14/18 1400 03/14/18 1816  Weight: 70.7 kg 70 kg    Intake/Output Summary (Last 24 hours) at 03/15/2018 1238 Last data filed at 03/14/2018 1932 Gross per 24 hour  Intake 220 ml  Output 750 ml  Net -530 ml    Additional Objective Labs: Basic Metabolic Panel: Recent Labs  Lab 03/12/18 1928 03/13/18 1158 03/14/18 1440  NA 135  --  131*  K 3.4*  --  3.7  CL 93*  --  95*  CO2 25  --  25  GLUCOSE 228*  --  257*  BUN 22*  --  59*  CREATININE 3.85* 5.74* 7.40*  CALCIUM 9.0  --  8.2*  PHOS  --   --  4.3   Liver Function Tests: Recent Labs  Lab 03/14/18 1440  ALBUMIN 2.3*   CBC: Recent Labs  Lab 03/12/18 1928 03/13/18 1158 03/14/18 1440  WBC 10.4 11.0* 11.3*  HGB 10.7* 9.9* 9.1*  HCT 32.6* 31.1* 27.5*  MCV 92.1 93.1 92.9  PLT 95* 105* 119*   Blood Culture    Component Value Date/Time   SDES BLOOD RIGHT HAND 03/13/2018 0134   SPECREQUEST  03/13/2018 0134    BOTTLES DRAWN AEROBIC AND ANAEROBIC Blood Culture results may not be optimal due to an inadequate volume of blood received in culture bottles   CULT STREPTOCOCCUS GORDONII (A) 03/13/2018 0134   REPTSTATUS 03/15/2018  FINAL 03/13/2018 0134    Cardiac Enzymes: No results for input(s): CKTOTAL, CKMB, CKMBINDEX, TROPONINI in the last 168 hours. CBG: Recent Labs  Lab 03/14/18 1230 03/14/18 2048 03/14/18 2141 03/15/18 0728 03/15/18 1212  GLUCAP 204* 178* 172* 132* 216*   Iron Studies: No results for input(s): IRON, TIBC, TRANSFERRIN, FERRITIN in the last 72 hours. Lab Results  Component Value Date   INR 1.09 02/07/2017   INR 1.0 03/01/2016   INR 1.29 09/03/2015   Studies/Results: No results found.  Medications: . sodium chloride 10 mL/hr at 03/13/18 1738  .  ceFAZolin (ANCEF) IV Stopped (03/14/18 1932)   . amLODipine  2.5 mg Oral Daily  . atorvastatin  40 mg Oral q1800  . carvedilol  6.25 mg Oral BID WC  . Chlorhexidine Gluconate Cloth  6 each Topical Q0600  . doxercalciferol  3 mcg Intravenous Q T,Th,Sa-HD  . feeding supplement (NEPRO CARB STEADY)  237 mL Oral BID BM  . furosemide  80 mg Oral BID  . gabapentin  300 mg Oral Daily  . heparin  5,000 Units Subcutaneous Q8H  . insulin aspart  0-5 Units Subcutaneous QHS  . insulin  aspart  0-9 Units Subcutaneous TID WC  . insulin glargine  10 Units Subcutaneous BID  . multivitamin  1 tablet Oral QHS  . sevelamer carbonate  800 mg Oral TID WC  . timolol  1 drop Left Eye BID    Dialysis Orders: TTS - [facility]  4.25hrs, BFR 400, DFR 800,  EDW 71kg, 2K/ 2.25Ca  Access: LU AVG  Heparin 5000 unit bolus, 1500 mid run Hectorol 3 mcg IV qHD   Assessment/Plan: 1. Strep bacteremia - source unclear - possibly tooth? tmax 100.9 in last 24hrs, BC+ strep gordonii. On IV Abx (cefazolin). ID consulting. TTE show no endocarditis. Primary/ID. 2. Lower back pain - MRI w/o showed stenosis w/disc extrusion @L5 /S1 per radiology no indication of infection. Neurosurgery did not think MRI findings severe enough for surgery, and MRI w/contrast due to severity of pt pain. [place] for MRI w/contrast if needed.  ID consulting. Pain control is major issue  3.  ESRD -  On HD  TTS schedule.  HD yesterday tolerated well except for back pain.  Will continue regular schedule while admitted via AVG, next due Tuesday.  4.  Hypertension/volume  - BP elevated. On norvasc and coreg.  Improved post HD, and elevated again today.  If weights correct, under edw.  Getting under at OP center, will likely need new edw at d/c. Pain may be contrib to high BP 5.  Anemia of CKD - Hgb 9.1. Follow trends.  ESA ordered with HD tues.  6.  Secondary Hyperparathyroidism -  Ca and phos in goal. Continue Hectorol and renvela  7.  Nutrition - Renal diet w/fluid restrictions once advanced. Renavite.   Jen Mow, PA-C [facility] Pager: [phone] 03/15/2018,12:38 PM  LOS: 0 days    Patient seen and examined, agree with above note with above modifications. Looks better- has many social concerns with payment and assistance- I cannot answer these concerns- will touch base with SW at [place] next week to see what options are-  Plan for next HD Tuesday - cont ancef  Corliss Parish, MD 03/15/2018

## 2018-03-15 NOTE — Progress Notes (Addendum)
Progress Note    Russell Price  [id number] DOB: 05-17-1961  DOA: 03/12/2018 PCP: Charlott Rakes, MD    Brief Narrative:     Medical records reviewed and are as summarized below:  Russell Price is an 57 y.o. male with medical history significant for ESRD on TTS, hypertension, DM 2, CAD status post CAD (no stent) in 02/2016, glaucoma and blindness in the right eye who presented to ED with low back pain and bilateral leg weakness.  History provided by patient and patient's wife at bedside. Patient reports spontaneous onset of bilateral low back pain 2 days ago.  Denies history of trauma, injury or unusual activity.  Never had similar pain before.  Describes the pain as achy and spastic.  Pain severity 8/10 before arrival and now.  No radiation to his legs.  Pain is worse with movement.  No alleviating factor.  Reports bilateral leg weakness and difficulty walking for the last 2 days.  Denies numbness, tingling or saddle anesthesia..  Denies changes in regards to bowel or bladder habits.   Assessment/Plan:   Active Problems:   Essential hypertension   DM (diabetes mellitus) with complications (HCC)   Blind right eye   Debility   ESRD (end stage renal disease) (HCC)   CAD (coronary artery disease)   Fever   Acute low back pain   Bilateral leg weakness   Streptococcal bacteremia  Strep bacteremia- -? Tooth source-- patient does have a damaged tooth-- will get orthopantogram and possible dental consult if needed -ID consult -IV Abx   Low back pain -MRI w/o contrast shows stenosis with dsic extrusion at L5/S1 -discussed MRI with Dr. Nevada Crane (radiology) who does not recommend MRI with contrast due to renal failure.  He reviewed and does not see any indication of infection which is the same as first read.  But if worsening back pain, can repeat MRI w/o contrast. -NS consult appreciated -PT consult -pain is uncontrolled but improved today from yesterday-- increase PRN oxy, if  not effective, can use IV Pain medications  ESRD -HD T/Th/Sat -last received HD on 10/26  DM2 -SSI -lantus  HTN -resume home meds  Constipation -bowel regimen ordered but patient has still not recieved -PRN PO and suppositories  Patient has uncontrolled pain, bacteremia for which treatment/work up with pass 2 midnight so will change to inpatient status.    Family Communication/Anticipated D/C date and plan/Code Status   DVT prophylaxis: heparin Code Status: Full Code.  Family Communication: wife at bedside Disposition Plan: pending repeat blood cultures   Medical Consultants:    ID  Nephrology  NS   Subjective:   Pain improved but still 7/10 No BM  Objective:    Vitals:   03/14/18 1700 03/14/18 1730 03/14/18 1816 03/15/18 0635  BP: (!) 160/70 (!) 150/60 (!) 160/70 (!) 162/70  Pulse: 80 78 82 78  Resp:   20 18  Temp:   98 F (36.7 C) 100 F (37.8 C)  TempSrc:   Oral Oral  SpO2:   98% 94%  Weight:   70 kg     Intake/Output Summary (Last 24 hours) at 03/15/2018 1128 Last data filed at 03/14/2018 1932 Gross per 24 hour  Intake 220 ml  Output 750 ml  Net -530 ml   Filed Weights   03/14/18 1400 03/14/18 1816  Weight: 70.7 kg 70 kg    Exam: In chair, appears more comfortable rrr Sinus, no LE edema +BS, soft A+Ox3, NAD  Data Reviewed:   I have personally reviewed following labs and imaging studies:  Labs: Labs show the following:   Basic Metabolic Panel: Recent Labs  Lab 03/12/18 1928 03/13/18 1158 03/14/18 1440  NA 135  --  131*  K 3.4*  --  3.7  CL 93*  --  95*  CO2 25  --  25  GLUCOSE 228*  --  257*  BUN 22*  --  59*  CREATININE 3.85* 5.74* 7.40*  CALCIUM 9.0  --  8.2*  PHOS  --   --  4.3   GFR Estimated Creatinine Clearance: 10.6 mL/min (A) (by C-G formula based on SCr of 7.4 mg/dL (H)). Liver Function Tests: Recent Labs  Lab 03/14/18 1440  ALBUMIN 2.3*   No results for input(s): LIPASE, AMYLASE in the last  168 hours. No results for input(s): AMMONIA in the last 168 hours. Coagulation profile No results for input(s): INR, PROTIME in the last 168 hours.  CBC: Recent Labs  Lab 03/12/18 1928 03/13/18 1158 03/14/18 1440  WBC 10.4 11.0* 11.3*  HGB 10.7* 9.9* 9.1*  HCT 32.6* 31.1* 27.5*  MCV 92.1 93.1 92.9  PLT 95* 105* 119*   Cardiac Enzymes: No results for input(s): CKTOTAL, CKMB, CKMBINDEX, TROPONINI in the last 168 hours. BNP (last 3 results) No results for input(s): PROBNP in the last 8760 hours. CBG: Recent Labs  Lab 03/14/18 0745 03/14/18 1230 03/14/18 2048 03/14/18 2141 03/15/18 0728  GLUCAP 191* 204* 178* 172* 132*   D-Dimer: No results for input(s): DDIMER in the last 72 hours. Hgb A1c: Recent Labs    03/13/18 1158  HGBA1C 7.5*   Lipid Profile: No results for input(s): CHOL, HDL, LDLCALC, TRIG, CHOLHDL, LDLDIRECT in the last 72 hours. Thyroid function studies: No results for input(s): TSH, T4TOTAL, T3FREE, THYROIDAB in the last 72 hours.  Invalid input(s): FREET3 Anemia work up: No results for input(s): VITAMINB12, FOLATE, FERRITIN, TIBC, IRON, RETICCTPCT in the last 72 hours. Sepsis Labs: Recent Labs  Lab 03/12/18 1928 03/13/18 0134 03/13/18 1158 03/14/18 1440  WBC 10.4  --  11.0* 11.3*  LATICACIDVEN  --  1.30  --   --     Microbiology Recent Results (from the past 240 hour(s))  Urine Culture     Status: Abnormal   Collection Time: 03/13/18 12:54 AM  Result Value Ref Range Status   Specimen Description URINE, RANDOM  Final   Special Requests NONE  Final   Culture (A)  Final    <10,000 COLONIES/mL INSIGNIFICANT GROWTH Performed at [facility], [address], [place], [place] [address]    Report Status 03/14/2018 FINAL  Final  Blood culture (routine x 2)     Status: Abnormal   Collection Time: 03/13/18  1:24 AM  Result Value Ref Range Status   Specimen Description BLOOD RIGHT FOREARM  Final   Special Requests   Final    BOTTLES DRAWN  AEROBIC AND ANAEROBIC Blood Culture results may not be optimal due to an inadequate volume of blood received in culture bottles   Culture  Setup Time   Final    GRAM POSITIVE COCCI IN BOTH AEROBIC AND ANAEROBIC BOTTLES CRITICAL RESULT CALLED TO, READ BACK BY AND VERIFIED WITHKarsten Ro [facility] 03/13/18 2239 JDW Performed at [facility], [place] [address], [place], [place] [address]    Culture STREPTOCOCCUS GORDONII (A)  Final   Report Status 03/15/2018 FINAL  Final   Organism ID, Bacteria STREPTOCOCCUS GORDONII  Final  Susceptibility   Streptococcus gordonii - MIC*    PENICILLIN 0.25 INTERMEDIATE Intermediate     CEFTRIAXONE 0.25 SENSITIVE Sensitive     ERYTHROMYCIN 2 RESISTANT Resistant     LEVOFLOXACIN 1 SENSITIVE Sensitive     VANCOMYCIN 0.5 SENSITIVE Sensitive     * STREPTOCOCCUS GORDONII  Blood Culture ID Panel (Reflexed)     Status: Abnormal   Collection Time: 03/13/18  1:24 AM  Result Value Ref Range Status   Enterococcus species NOT DETECTED NOT DETECTED Final   Listeria monocytogenes NOT DETECTED NOT DETECTED Final   Staphylococcus species NOT DETECTED NOT DETECTED Final   Staphylococcus aureus (BCID) NOT DETECTED NOT DETECTED Final   Streptococcus species DETECTED (A) NOT DETECTED Final    Comment: Not Enterococcus species, Streptococcus agalactiae, Streptococcus pyogenes, or Streptococcus pneumoniae. CRITICAL RESULT CALLED TO, READ BACK BY AND VERIFIED WITH: J LEDFORD [facility] 03/13/18 2239 JDW    Streptococcus agalactiae NOT DETECTED NOT DETECTED Final   Streptococcus pneumoniae NOT DETECTED NOT DETECTED Final   Streptococcus pyogenes NOT DETECTED NOT DETECTED Final   Acinetobacter baumannii NOT DETECTED NOT DETECTED Final   Enterobacteriaceae species NOT DETECTED NOT DETECTED Final   Enterobacter cloacae complex NOT DETECTED NOT DETECTED Final   Escherichia coli NOT DETECTED NOT DETECTED Final   Klebsiella oxytoca NOT DETECTED NOT DETECTED Final   Klebsiella  pneumoniae NOT DETECTED NOT DETECTED Final   Proteus species NOT DETECTED NOT DETECTED Final   Serratia marcescens NOT DETECTED NOT DETECTED Final   Haemophilus influenzae NOT DETECTED NOT DETECTED Final   Neisseria meningitidis NOT DETECTED NOT DETECTED Final   Pseudomonas aeruginosa NOT DETECTED NOT DETECTED Final   Candida albicans NOT DETECTED NOT DETECTED Final   Candida glabrata NOT DETECTED NOT DETECTED Final   Candida krusei NOT DETECTED NOT DETECTED Final   Candida parapsilosis NOT DETECTED NOT DETECTED Final   Candida tropicalis NOT DETECTED NOT DETECTED Final  Blood culture (routine x 2)     Status: Abnormal   Collection Time: 03/13/18  1:34 AM  Result Value Ref Range Status   Specimen Description BLOOD RIGHT HAND  Final   Special Requests   Final    BOTTLES DRAWN AEROBIC AND ANAEROBIC Blood Culture results may not be optimal due to an inadequate volume of blood received in culture bottles   Culture  Setup Time   Final    IN BOTH AEROBIC AND ANAEROBIC BOTTLES GRAM POSITIVE COCCI CRITICAL RESULT CALLED TO, READ BACK BY AND VERIFIED WITH: J [facility] 03/13/18 2239 JDW    Culture STREPTOCOCCUS GORDONII (A)  Final   Report Status 03/15/2018 FINAL  Final    Procedures and diagnostic studies:  No results found.  Medications:   . amLODipine  2.5 mg Oral Daily  . atorvastatin  40 mg Oral q1800  . carvedilol  6.25 mg Oral BID WC  . Chlorhexidine Gluconate Cloth  6 each Topical Q0600  . doxercalciferol  3 mcg Intravenous Q T,Th,Sa-HD  . feeding supplement (NEPRO CARB STEADY)  237 mL Oral BID BM  . furosemide  80 mg Oral BID  . gabapentin  300 mg Oral Daily  . heparin  5,000 Units Subcutaneous Q8H  . insulin aspart  0-5 Units Subcutaneous QHS  . insulin aspart  0-9 Units Subcutaneous TID WC  . insulin glargine  10 Units Subcutaneous BID  . multivitamin  1 tablet Oral QHS  . sevelamer carbonate  800 mg Oral TID WC  . timolol  1 drop Left Eye  BID   Continuous  Infusions: . sodium chloride 10 mL/hr at 03/13/18 1738  .  ceFAZolin (ANCEF) IV Stopped (03/14/18 1932)     LOS: 0 days   Geradine Girt  [facility]   *Please refer to [email or web address], password [email or web address] to get updated schedule on who will round on this patient, as hospitalists switch teams weekly. If 7PM-7AM, please contact night-coverage at [email or web address], password [email or web address] for any overnight needs.  03/15/2018, 11:28 AM

## 2018-03-15 NOTE — Evaluation (Signed)
Physical Therapy Evaluation Patient Details Name: Russell Price MRN: [id number] DOB: 1961/03/02 Today's Date: 03/15/2018   History of Present Illness  Russell Price is an 57 y.o. male with medical history significant forESRD on TTS, hypertension, DM 2, CAD status post CAD (no stent) in 02/2016, glaucoma and blindness in the right eye who presented to ED with low back pain and bilateral leg weakness. History provided by patient and patient's wife at bedside.Patient reports spontaneous onset of bilateral low back pain 2 days ago.  Denies history of trauma, injury or unusual activity.  Never had similar pain before.  Describes the pain as achy and spastic.  Pain severity 8/10 before arrival and now.  No radiation to his legs.  Pain is worse with movement.  No alleviating factor.  Reports bilateral leg weakness and difficulty walking for the last 2 days.   Clinical Impression  Pt admitted with above diagnosis. Pt currently with functional limitations due to the deficits listed below (see PT Problem List). Pt was able to ambulate with RW with pain however pt and wife pleased with pts progress.  Will follow acutely.   Pt will benefit from skilled PT to increase their independence and safety with mobility to allow discharge to the venue listed below.      Follow Up Recommendations Home health PT;Supervision/Assistance - 24 hour    Equipment Recommendations  None recommended by PT , issued gait belt and pt has his RW in room   Recommendations for Other Services       Precautions / Restrictions Precautions Precautions: Fall Restrictions Weight Bearing Restrictions: No      Mobility  Bed Mobility Overal bed mobility: Needs Assistance Bed Mobility: Rolling;Sidelying to Sit;Sit to Sidelying Rolling: Supervision Sidelying to sit: Supervision     Sit to sidelying: Supervision General bed mobility comments: Cues and education regarding log roll to control back pain.   Transfers Overall  transfer level: Needs assistance Equipment used: Rolling walker (2 wheeled) Transfers: Sit to/from Stand Sit to Stand: Min guard         General transfer comment: cues needed for hand placement  Ambulation/Gait Ambulation/Gait assistance: Min guard Gait Distance (Feet): 110 Feet Assistive device: Rolling walker (2 wheeled) Gait Pattern/deviations: Step-through pattern;Decreased stride length;Trunk flexed;Antalgic   Gait velocity interpretation: <1.31 ft/sec, indicative of household ambulator General Gait Details: Pt needed cues for proximity to RW as he would push RW out too far at times.  Pt also needing cues to stand upright as he would incr trunk flexion when fatigued.  Pt very happy as he states he couldn't walk like he is now yesterday.  Issued gait belt to pt and wife so she can assist pt at home prn.   Stairs            Wheelchair Mobility    Modified Rankin (Stroke Patients Only)       Balance Overall balance assessment: Needs assistance Sitting-balance support: No upper extremity supported;Feet supported Sitting balance-Leahy Scale: Fair     Standing balance support: Bilateral upper extremity supported;During functional activity Standing balance-Leahy Scale: Poor Standing balance comment: relies on heavy UE support for balance.                              Pertinent Vitals/Pain Pain Assessment: 0-10 Pain Score: 10-Worst pain ever Pain Location: back Pain Descriptors / Indicators: Aching;Grimacing;Guarding Pain Intervention(s): Limited activity within patient's tolerance;Monitored during session;Premedicated before session;Repositioned  Home Living Family/patient expects to be discharged to:: Private residence Living Arrangements: Spouse/significant other Available Help at Discharge: Family Type of Home: House Home Access: Level entry     [place]: [place]: Environmental consultant - 2 wheels      Prior Function Level of  Independence: Independent         Comments: Unemployed - disability     Hand Dominance   Dominant Hand: Right    Extremity/Trunk Assessment   Upper Extremity Assessment Upper Extremity Assessment: Defer to OT evaluation    Lower Extremity Assessment Lower Extremity Assessment: Generalized weakness    Cervical / Trunk Assessment Cervical / Trunk Assessment: Normal  Communication   Communication: Prefers language other than English(Sudanese, Arabic and English)  Cognition Arousal/Alertness: Awake/alert Behavior During Therapy: WFL for tasks assessed/performed Overall Cognitive Status: Within Functional Limits for tasks assessed                                        General Comments      Exercises     Assessment/Plan    PT Assessment Patient needs continued PT services  PT Problem List Decreased strength;Decreased activity tolerance;Decreased balance;Decreased mobility;Decreased safety awareness;Decreased knowledge of use of DME;Cardiopulmonary status limiting activity;Pain       PT Treatment Interventions DME instruction;Gait training;Functional mobility training;Therapeutic activities;Therapeutic exercise;Balance training;Patient/family education    PT Goals (Current goals can be found in the Care Plan section)  Acute Rehab PT Goals Patient Stated Goal: to go home PT Goal Formulation: With patient Time For Goal Achievement: 03/29/18 Potential to Achieve Goals: Good    Frequency Min 3X/week   Barriers to discharge        Co-evaluation               AM-PAC PT "6 Clicks" Daily Activity  Outcome Measure Difficulty turning over in bed (including adjusting bedclothes, sheets and blankets)?: None Difficulty moving from lying on back to sitting on the side of the bed? : None Difficulty sitting down on and standing up from a chair with arms (e.g., wheelchair, bedside commode, etc,.)?: A Little Help needed moving to and from a bed to  chair (including a wheelchair)?: A Little Help needed walking in hospital room?: A Little Help needed climbing 3-5 steps with a railing? : A Lot 6 Click Score: 19    End of Session Equipment Utilized During Treatment: Gait belt Activity Tolerance: Patient limited by fatigue;Patient limited by pain Patient left: with call bell/phone within reach;in bed;with family/visitor present Nurse Communication: Mobility status PT Visit Diagnosis: Unsteadiness on feet (R26.81);Muscle weakness (generalized) (M62.81);Pain Pain - part of body: (back)    Time: 1100-1129 PT Time Calculation (min) (ACUTE ONLY): 29 min   Charges:   PT Evaluation $PT Eval Moderate Complexity: 1 Mod PT Treatments $Gait Training: 8-22 mins        [place] Pager:  [phone]  Office:  [place] 03/15/2018, 2:08 PM

## 2018-03-15 NOTE — Progress Notes (Signed)
Pt transported to xray 

## 2018-03-15 NOTE — Progress Notes (Signed)
Pt admitted to room 5M13 from [facility]. Patient is alert and oriented x 4. Limited ability to turn and transfer due to back discomfort. Spouse at bedside. Will continue to monitor.

## 2018-03-16 DIAGNOSIS — B954 Other streptococcus as the cause of diseases classified elsewhere: Secondary | ICD-10-CM

## 2018-03-16 DIAGNOSIS — Z95828 Presence of other vascular implants and grafts: Secondary | ICD-10-CM

## 2018-03-16 DIAGNOSIS — M545 Low back pain: Secondary | ICD-10-CM

## 2018-03-16 DIAGNOSIS — R300 Dysuria: Secondary | ICD-10-CM

## 2018-03-16 DIAGNOSIS — R3911 Hesitancy of micturition: Secondary | ICD-10-CM

## 2018-03-16 DIAGNOSIS — R7881 Bacteremia: Principal | ICD-10-CM

## 2018-03-16 LAB — URINALYSIS, ROUTINE W REFLEX MICROSCOPIC
Bilirubin Urine: NEGATIVE
Glucose, UA: NEGATIVE mg/dL
Ketones, ur: NEGATIVE mg/dL
Nitrite: NEGATIVE
Protein, ur: 100 mg/dL — AB
Specific Gravity, Urine: 1.012 (ref 1.005–1.030)
pH: 6 (ref 5.0–8.0)

## 2018-03-16 LAB — BASIC METABOLIC PANEL
ANION GAP: 13 (ref 5–15)
BUN: 38 mg/dL — ABNORMAL HIGH (ref 6–20)
CALCIUM: 8.9 mg/dL (ref 8.9–10.3)
CHLORIDE: 95 mmol/L — AB (ref 98–111)
CO2: 24 mmol/L (ref 22–32)
Creatinine, Ser: 6.02 mg/dL — ABNORMAL HIGH (ref 0.61–1.24)
GFR calc non Af Amer: 9 mL/min — ABNORMAL LOW (ref >60)
GFR, EST AFRICAN AMERICAN: 11 mL/min — AB (ref >60)
Glucose, Bld: 172 mg/dL — ABNORMAL HIGH (ref 70–99)
POTASSIUM: 3.8 mmol/L (ref 3.5–5.1)
Sodium: 132 mmol/L — ABNORMAL LOW (ref 135–145)

## 2018-03-16 LAB — CBC
HCT: 30.6 % — ABNORMAL LOW (ref 39.0–52.0)
HEMOGLOBIN: 9.8 g/dL — AB (ref 13.0–17.0)
MCH: 30.3 pg (ref 26.0–34.0)
MCHC: 32 g/dL (ref 30.0–36.0)
MCV: 94.7 fL (ref 80.0–100.0)
NRBC: 0 % (ref 0.0–0.2)
Platelets: 140 10*3/uL — ABNORMAL LOW (ref 150–400)
RBC: 3.23 MIL/uL — AB (ref 4.22–5.81)
RDW: 14.2 % (ref 11.5–15.5)
WBC: 10.1 10*3/uL (ref 4.0–10.5)

## 2018-03-16 LAB — GLUCOSE, CAPILLARY
Glucose-Capillary: 191 mg/dL — ABNORMAL HIGH (ref 70–99)
Glucose-Capillary: 142 mg/dL — ABNORMAL HIGH (ref 70–99)
Glucose-Capillary: 148 mg/dL — ABNORMAL HIGH (ref 70–99)
Glucose-Capillary: 176 mg/dL — ABNORMAL HIGH (ref 70–99)

## 2018-03-16 NOTE — Plan of Care (Signed)
  Problem: Activity: Goal: Risk for activity intolerance will decrease Outcome: Progressing   

## 2018-03-16 NOTE — Progress Notes (Signed)
Patient ID: Russell Price, male   DOB: [birth year], 59 y.o.   MRN: [id number]         [facility]  Date of Admission:  03/12/2018   Total days of antibiotics 5        Day 3 cefazolin         ASSESSMENT: He has strep gordonae bacteremia.  Although his MRI did not confirm discitis I suspect that he has early vertebral infection given his recent onset of severe lower back pain.  His symptoms also suggest that he may have a symptomatic bladder infection.  PLAN: 1. Continue cefazolin 2. Repeat blood cultures 3. UA and urine culture  Principal Problem:   Streptococcal bacteremia Active Problems:   Acute low back pain   Essential hypertension   DM (diabetes mellitus) with complications (HCC)   Blind right eye   Debility   ESRD (end stage renal disease) (HCC)   CAD (coronary artery disease)   Fever   Bilateral leg weakness   Scheduled Meds: . amLODipine  2.5 mg Oral Daily  . atorvastatin  40 mg Oral q1800  . carvedilol  6.25 mg Oral BID WC  . Chlorhexidine Gluconate Cloth  6 each Topical Q0600  . [START ON 03/17/2018] darbepoetin (ARANESP) injection - DIALYSIS  40 mcg Intravenous Q Tue-HD  . doxercalciferol  3 mcg Intravenous Q T,Th,Sa-HD  . feeding supplement (NEPRO CARB STEADY)  237 mL Oral BID BM  . furosemide  80 mg Oral BID  . gabapentin  300 mg Oral Daily  . heparin  5,000 Units Subcutaneous Q8H  . insulin aspart  0-5 Units Subcutaneous QHS  . insulin aspart  0-9 Units Subcutaneous TID WC  . insulin glargine  10 Units Subcutaneous BID  . multivitamin  1 tablet Oral QHS  . sevelamer carbonate  800 mg Oral TID WC  . timolol  1 drop Left Eye BID   Continuous Infusions: . sodium chloride 10 mL/hr at 03/13/18 1738  .  ceFAZolin (ANCEF) IV Stopped (03/14/18 1932)   PRN Meds:.sodium chloride, acetaminophen, bisacodyl, cyclobenzaprine, HYDROmorphone (DILAUDID) injection, oxyCODONE, polyethylene glycol, sorbitol   SUBJECTIVE: He recently had  sudden onset of severe lower midline back pain.  He is also had fever and chills.  Some mild dysuria and urinary hesitancy.  Review of Systems: Review of Systems  Constitutional: Positive for chills, fever and malaise/fatigue. Negative for diaphoresis.  Respiratory: Negative for cough.   Cardiovascular: Negative for chest pain.  Gastrointestinal: Negative for abdominal pain, nausea and vomiting.  Genitourinary: Positive for dysuria.       Urinary hesitancy.  Musculoskeletal: Positive for back pain.  Skin: Negative for rash.  Neurological: Negative for headaches.    No Known Allergies  OBJECTIVE: Vitals:   03/15/18 1743 03/15/18 2125 03/16/18 0533 03/16/18 0737  BP: (!) 165/63 (!) 131/53 (!) 155/81 140/67  Pulse: 78 (!) 55 79 70  Resp: 18 18 18 18   Temp: (!) 102.3 F (39.1 C)  99.2 F (37.3 C) 99.3 F (37.4 C)  TempSrc: Oral  Oral Oral  SpO2: 97% 100% 100% 96%  Weight:       Body mass index is 21.52 kg/m.  Physical Exam  Constitutional:  He is sitting up on the side of the bed.  He is slightly groggy due to pain medication.  His wife is visiting.  Cardiovascular: Normal rate, regular rhythm and normal heart sounds.  Pulmonary/Chest: Effort normal and breath sounds normal.  Abdominal: Soft. There is  no tenderness.  Skin: No rash noted.  Left upper arm AV graft appears normal.    Lab Results Lab Results  Component Value Date   WBC 10.1 03/16/2018   HGB 9.8 (L) 03/16/2018   HCT 30.6 (L) 03/16/2018   MCV 94.7 03/16/2018   PLT 140 (L) 03/16/2018    Lab Results  Component Value Date   CREATININE 6.02 (H) 03/16/2018   BUN 38 (H) 03/16/2018   NA 132 (L) 03/16/2018   K 3.8 03/16/2018   CL 95 (L) 03/16/2018   CO2 24 03/16/2018    Lab Results  Component Value Date   ALT 18 07/21/2017   AST 18 07/21/2017   ALKPHOS 113 07/21/2017   BILITOT 1.1 07/21/2017     Microbiology: Recent Results (from the past 240 hour(s))  Urine Culture     Status: Abnormal    Collection Time: 03/13/18 12:54 AM  Result Value Ref Range Status   Specimen Description URINE, RANDOM  Final   Special Requests NONE  Final   Culture (A)  Final    <10,000 COLONIES/mL INSIGNIFICANT GROWTH Performed at [facility], [address], [place], [place] [address]    Report Status 03/14/2018 FINAL  Final  Blood culture (routine x 2)     Status: Abnormal   Collection Time: 03/13/18  1:24 AM  Result Value Ref Range Status   Specimen Description BLOOD RIGHT FOREARM  Final   Special Requests   Final    BOTTLES DRAWN AEROBIC AND ANAEROBIC Blood Culture results may not be optimal due to an inadequate volume of blood received in culture bottles   Culture  Setup Time   Final    GRAM POSITIVE COCCI IN BOTH AEROBIC AND ANAEROBIC BOTTLES CRITICAL RESULT CALLED TO, READ BACK BY AND VERIFIED WITHKarsten Ro [facility] 03/13/18 2239 JDW Performed at [facility], [place] [address] [place], [place] [address]    Culture STREPTOCOCCUS GORDONII (A)  Final   Report Status 03/15/2018 FINAL  Final   Organism ID, Bacteria STREPTOCOCCUS GORDONII  Final      Susceptibility   Streptococcus gordonii - MIC*    PENICILLIN 0.25 INTERMEDIATE Intermediate     CEFTRIAXONE 0.25 SENSITIVE Sensitive     ERYTHROMYCIN 2 RESISTANT Resistant     LEVOFLOXACIN 1 SENSITIVE Sensitive     VANCOMYCIN 0.5 SENSITIVE Sensitive     * STREPTOCOCCUS GORDONII  Blood Culture ID Panel (Reflexed)     Status: Abnormal   Collection Time: 03/13/18  1:24 AM  Result Value Ref Range Status   Enterococcus species NOT DETECTED NOT DETECTED Final   Listeria monocytogenes NOT DETECTED NOT DETECTED Final   Staphylococcus species NOT DETECTED NOT DETECTED Final   Staphylococcus aureus (BCID) NOT DETECTED NOT DETECTED Final   Streptococcus species DETECTED (A) NOT DETECTED Final    Comment: Not Enterococcus species, Streptococcus agalactiae, Streptococcus pyogenes, or Streptococcus pneumoniae. CRITICAL RESULT CALLED TO, READ  BACK BY AND VERIFIED WITH: J LEDFORD [facility] 03/13/18 2239 JDW    Streptococcus agalactiae NOT DETECTED NOT DETECTED Final   Streptococcus pneumoniae NOT DETECTED NOT DETECTED Final   Streptococcus pyogenes NOT DETECTED NOT DETECTED Final   Acinetobacter baumannii NOT DETECTED NOT DETECTED Final   Enterobacteriaceae species NOT DETECTED NOT DETECTED Final   Enterobacter cloacae complex NOT DETECTED NOT DETECTED Final   Escherichia coli NOT DETECTED NOT DETECTED Final   Klebsiella oxytoca NOT DETECTED NOT DETECTED Final   Klebsiella pneumoniae NOT DETECTED NOT DETECTED Final  Proteus species NOT DETECTED NOT DETECTED Final   Serratia marcescens NOT DETECTED NOT DETECTED Final   Haemophilus influenzae NOT DETECTED NOT DETECTED Final   Neisseria meningitidis NOT DETECTED NOT DETECTED Final   Pseudomonas aeruginosa NOT DETECTED NOT DETECTED Final   Candida albicans NOT DETECTED NOT DETECTED Final   Candida glabrata NOT DETECTED NOT DETECTED Final   Candida krusei NOT DETECTED NOT DETECTED Final   Candida parapsilosis NOT DETECTED NOT DETECTED Final   Candida tropicalis NOT DETECTED NOT DETECTED Final  Blood culture (routine x 2)     Status: Abnormal   Collection Time: 03/13/18  1:34 AM  Result Value Ref Range Status   Specimen Description BLOOD RIGHT HAND  Final   Special Requests   Final    BOTTLES DRAWN AEROBIC AND ANAEROBIC Blood Culture results may not be optimal due to an inadequate volume of blood received in culture bottles   Culture  Setup Time   Final    IN BOTH AEROBIC AND ANAEROBIC BOTTLES GRAM POSITIVE COCCI CRITICAL RESULT CALLED TO, READ BACK BY AND VERIFIED WITHLenna Sciara [facility] 03/13/18 2239 JDW    Culture STREPTOCOCCUS GORDONII (A)  Final   Report Status 03/15/2018 FINAL  Final    Michel Bickers, MD [facility] [phone] pager   [phone] cell 03/16/2018, 9:51 AM

## 2018-03-16 NOTE — Progress Notes (Signed)
[facility] Progress Note   Subjective: Called from OP HD center to report positive BC drawn 03/12/2018-gram positive cocci.   Objective Vitals:   03/15/18 1743 03/15/18 2125 03/16/18 0533 03/16/18 0737  BP: (!) 165/63 (!) 131/53 (!) 155/81 140/67  Pulse: 78 (!) 55 79 70  Resp: 18 18 18 18   Temp: (!) 102.3 F (39.1 C)  99.2 F (37.3 C) 99.3 F (37.4 C)  TempSrc: Oral  Oral Oral  SpO2: 97% 100% 100% 96%  Weight:       Physical Exam General: Pleasant, chronically ill appearing male in NAD Heart: S1,S2, RRR Lungs: CTAB A/P Abdomen: active BS Extremities: No LE edema Dialysis Access: LUA AVG + bruit   Additional Objective Labs: Basic Metabolic Panel: Recent Labs  Lab 03/12/18 1928 03/13/18 1158 03/14/18 1440 03/16/18 0533  NA 135  --  131* 132*  K 3.4*  --  3.7 3.8  CL 93*  --  95* 95*  CO2 25  --  25 24  GLUCOSE 228*  --  257* 172*  BUN 22*  --  59* 38*  CREATININE 3.85* 5.74* 7.40* 6.02*  CALCIUM 9.0  --  8.2* 8.9  PHOS  --   --  4.3  --    Liver Function Tests: Recent Labs  Lab 03/14/18 1440  ALBUMIN 2.3*   No results for input(s): LIPASE, AMYLASE in the last 168 hours. CBC: Recent Labs  Lab 03/12/18 1928 03/13/18 1158 03/14/18 1440 03/16/18 0533  WBC 10.4 11.0* 11.3* 10.1  HGB 10.7* 9.9* 9.1* 9.8*  HCT 32.6* 31.1* 27.5* 30.6*  MCV 92.1 93.1 92.9 94.7  PLT 95* 105* 119* 140*   Blood Culture    Component Value Date/Time   SDES BLOOD RIGHT HAND 03/13/2018 0134   SPECREQUEST  03/13/2018 0134    BOTTLES DRAWN AEROBIC AND ANAEROBIC Blood Culture results may not be optimal due to an inadequate volume of blood received in culture bottles   CULT STREPTOCOCCUS GORDONII (A) 03/13/2018 0134   REPTSTATUS 03/15/2018 FINAL 03/13/2018 0134    Cardiac Enzymes: No results for input(s): CKTOTAL, CKMB, CKMBINDEX, TROPONINI in the last 168 hours. CBG: Recent Labs  Lab 03/15/18 0728 03/15/18 1212 03/15/18 1648 03/15/18 2126  03/16/18 0738  GLUCAP 132* 216* 136* 156* 191*   Iron Studies: No results for input(s): IRON, TIBC, TRANSFERRIN, FERRITIN in the last 72 hours. @lablastinr3 @ Studies/Results: Dg Orthopantogram  Result Date: 03/15/2018 CLINICAL DATA:  Strep bacteremia. EXAM: ORTHOPANTOGRAM/PANORAMIC COMPARISON:  None. FINDINGS: No obvious dental caries. There is periapical lucency noted around the second right maxillary molar and also the first left maxillary molar. The mandibular teeth appear normal. IMPRESSION: Periapical lucency noted around the upper molars bilaterally. No obvious dental caries. Electronically Signed   By: Marijo Sanes M.D.   On: 03/15/2018 16:55   Medications: . sodium chloride 10 mL/hr at 03/13/18 1738  .  ceFAZolin (ANCEF) IV Stopped (03/14/18 1932)   . amLODipine  2.5 mg Oral Daily  . atorvastatin  40 mg Oral q1800  . carvedilol  6.25 mg Oral BID WC  . Chlorhexidine Gluconate Cloth  6 each Topical Q0600  . [START ON 03/17/2018] darbepoetin (ARANESP) injection - DIALYSIS  40 mcg Intravenous Q Tue-HD  . doxercalciferol  3 mcg Intravenous Q T,Th,Sa-HD  . feeding supplement (NEPRO CARB STEADY)  237 mL Oral BID BM  . furosemide  80 mg Oral BID  . gabapentin  300 mg Oral Daily  . heparin  5,000 Units Subcutaneous Q8H  .  insulin aspart  0-5 Units Subcutaneous QHS  . insulin aspart  0-9 Units Subcutaneous TID WC  . insulin glargine  10 Units Subcutaneous BID  . multivitamin  1 tablet Oral QHS  . sevelamer carbonate  800 mg Oral TID WC  . timolol  1 drop Left Eye BID     Dialysis Orders: TTS -NW 4.25hrs, BFR400, JKQ206, EDW 71kg,2K/2.25Ca  Access:LU AVG Heparin5000 unit bolus, 1500 mid run Hectorol89mcg IV qHD   Assessment/Plan: 1. Strep gordonae bacteremia - source unclear. Repeat BC, UA, Urine cultures.Tmax 102.5 in last 24hrs, BC+ strep gordonii. On IV Ancef. ID following TTE show no endocarditis. Primary/ID. 2. Lower back pain - MRI w/o showed stenosis  w/disc extrusion @L5 /S1 per radiology no indication of infection. Neurosurgery did not think MRI findings severe enough for surgery, and MRI w/contrast due to severity of pt pain.[place] for MRI w/contrast if needed. 3. ESRD - On HD TTS schedule. HD tomorrow on schedule.   4. Hypertension/volume - BP elevated-may be due to pain. On norvasc and coreg. Under EDW post HD 03/14/2018. No evidence of volume overload. Lower EDW on DC.  5. Anemia of CKD - Hgb 9.8. Follow trends.  ESA ordered with HD tues.  6. Secondary Hyperparathyroidism - Ca and phos in goal. Continue Hectorol and renvela 7. Nutrition - Renal diet w/fluid restrictions once advanced. Renavite  Russell Price H. Jesselee Poth NP-C 03/16/2018, 11:01 AM  [employer] [phone]

## 2018-03-16 NOTE — Progress Notes (Signed)
PROGRESS NOTE    Patient: Russell Price                            PCP: Charlott Rakes, MD                    DOB: January 06, 1961            DOA: 03/12/2018 UXN:[id number]             DOS: 03/16/2018, 3:35 PM   LOS: 1 day   Date of Service: The patient was seen and examined on 03/16/2018  Subjective:   She was seen and examined this morning, stable.  Reporting improved ambulation, was able to take few steps to the bathroom. Wife present at bedside.  She  concerned about his debility and the possibility of rehab.  T-max 102.3, currently 99.3, normotensive, satting 96% on room air   Brief Narrative:   Drevion S Daddona is an 57 y.o. male with medical history significant forESRD on TTS, hypertension, DM 2, CAD status post CAD (no stent) in 02/2016, glaucoma and blindness in the right eye who presented to ED with low back pain and bilateral leg weakness. History provided by patient and patient's wife at bedside. Patient reports spontaneous onset of bilateral low back pain 2 days ago. Denies history of trauma, injury or unusual activity. Never had similar pain before. Describes the pain as achy and spastic.Pain severity 8/10 before arrival and now. No radiation to his legs. Pain is worse with movement. No alleviating factor. Reports bilateral leg weakness and difficulty walking for the last 2 days. Denies numbness,tingling or saddle anesthesia.. Denies changes in regards to bowel or bladder habits.   Principal Problem:   Streptococcal bacteremia Active Problems:   Essential hypertension   DM (diabetes mellitus) with complications (HCC)   Blind right eye   Debility   ESRD (end stage renal disease) ([place])   CAD (coronary artery disease)   Fever   Acute low back pain   Bilateral leg weakness    Assessment & Plan:    Strep bacteremia  -Blood culture positive for strep gordonii  -on unknown source, possible vertebral disc infection/discitis, -MRI was not  confirmatory -ID following very closely -IV Abx: Cefazolin, repeating blood cultures, urine cultures, UA  -TEE negative for endocarditis  Low back pain -MRI w/o contrast shows stenosis with dsic extrusion at L5/S1 -discussed MRI with Dr. Nevada Crane (radiology) who does not recommend MRI with contrast due to renal failure.  He reviewed and does not see any indication of infection.  But if worsening back pain, can repeat MRI w/o contrast. -Neurosurgery consult -did not think that MRI finding was severe enough for surgical intervention, if worsen MRI with contrast -PT consult -pain is uncontrolled--continue pain management, continue analgesics, long acting pain medications for now with close monitoring as renal failure, PO PRN short acting and IV refractory pain medication  ESRD -HD T/Th/Sat -Nephrology following closely,  DM2 -SSI -lantus  HTN -resume home meds  Constipation -bowel regimen -PRN PO and suppositories  DVT prophylaxis: Heparin SQ  Code Status:   Code Status: Prior  Family Communication:  The above findings and plan of care has been discussed with patient and family in detail, they expressed understanding and agreement of above.  Disposition Plan:  >3 days  Consultants: Infectious disease team/nephrology/neurosurgery/  Procedures:     None   Antimicrobials:  Anti-infectives (From admission, onward)  Start     Dose/Rate Route Frequency Ordered Stop   03/14/18 1800  ceFAZolin (ANCEF) IVPB 2g/100 mL premix     2 g 200 mL/hr over 30 Minutes Intravenous Every T-Th-Sa (Hemodialysis) 03/14/18 1620     03/14/18 1648  Vancomycin (VANCOCIN) 750-5 MG/150ML-% IVPB    Note to Pharmacy:  Cherylann Banas   : cabinet override      03/14/18 1648 03/14/18 1832   03/14/18 1200  vancomycin (VANCOCIN) IVPB 750 mg/150 ml premix  Status:  Discontinued     750 mg 150 mL/hr over 60 Minutes Intravenous Every T-Th-Sa (Hemodialysis) 03/13/18 0824 03/14/18 1620   03/13/18 2200   ceFEPIme (MAXIPIME) 1 g in sodium chloride 0.9 % 100 mL IVPB  Status:  Discontinued     1 g 200 mL/hr over 30 Minutes Intravenous Every 24 hours 03/13/18 0824 03/14/18 1620   03/13/18 0200  ceFEPIme (MAXIPIME) 2 g in sodium chloride 0.9 % 100 mL IVPB     2 g 200 mL/hr over 30 Minutes Intravenous  Once 03/13/18 0153 03/13/18 0319   03/13/18 0200  metroNIDAZOLE (FLAGYL) IVPB 500 mg  Status:  Discontinued     500 mg 100 mL/hr over 60 Minutes Intravenous Every 8 hours 03/13/18 0153 03/15/18 1004   03/13/18 0200  vancomycin (VANCOCIN) IVPB 1000 mg/200 mL premix     1,000 mg 200 mL/hr over 60 Minutes Intravenous  Once 03/13/18 0153 03/13/18 0425       Medication:  . amLODipine  2.5 mg Oral Daily  . atorvastatin  40 mg Oral q1800  . carvedilol  6.25 mg Oral BID WC  . Chlorhexidine Gluconate Cloth  6 each Topical Q0600  . [START ON 03/17/2018] darbepoetin (ARANESP) injection - DIALYSIS  40 mcg Intravenous Q Tue-HD  . doxercalciferol  3 mcg Intravenous Q T,Th,Sa-HD  . feeding supplement (NEPRO CARB STEADY)  237 mL Oral BID BM  . furosemide  80 mg Oral BID  . gabapentin  300 mg Oral Daily  . heparin  5,000 Units Subcutaneous Q8H  . insulin aspart  0-5 Units Subcutaneous QHS  . insulin aspart  0-9 Units Subcutaneous TID WC  . insulin glargine  10 Units Subcutaneous BID  . multivitamin  1 tablet Oral QHS  . sevelamer carbonate  800 mg Oral TID WC  . timolol  1 drop Left Eye BID    sodium chloride, acetaminophen, bisacodyl, cyclobenzaprine, HYDROmorphone (DILAUDID) injection, oxyCODONE, polyethylene glycol, sorbitol     Objective:   Vitals:   03/15/18 1743 03/15/18 2125 03/16/18 0533 03/16/18 0737  BP: (!) 165/63 (!) 131/53 (!) 155/81 140/67  Pulse: 78 (!) 55 79 70  Resp: 18 18 18 18   Temp: (!) 102.3 F (39.1 C)  99.2 F (37.3 C) 99.3 F (37.4 C)  TempSrc: Oral  Oral Oral  SpO2: 97% 100% 100% 96%  Weight:        Intake/Output Summary (Last 24 hours) at 03/16/2018 1535 Last  data filed at 03/16/2018 1434 Gross per 24 hour  Intake 690 ml  Output 0 ml  Net 690 ml   Filed Weights   03/14/18 1400 03/14/18 1816  Weight: 70.7 kg 70 kg     Examination:    General exam: Appears calm and comfortable  BP 140/67 (BP Location: Right Arm)   Pulse 70   Temp 99.3 F (37.4 C) (Oral)   Resp 18   Wt 70 kg   SpO2 96%   BMI 21.52 kg/m    Physical  Exam  Constitution:  Alert, cooperative, no distress,  Psychiatric: Normal and stable mood and affect, cognition intact,   HEENT: Normocephalic, PERRL, otherwise with in Normal limits  Chest:Chest symmetric Cardio vascular:  S1/S2, RRR, No murmure, No Rubs or Gallops  pulmonary: Clear to auscultation bilaterally, respirations unlabored, negative wheezes / crackles Abdomen: Soft, non-tender, non-distended, bowel sounds,no masses, no organomegaly Muscular skeletal: Limited exam - in bed, able to move all 4 extremities, Normal strength, lower extremity weakness, Neuro: Generalized weaknesses, specifically lower extremities CNII-XII intact. , normal motor and sensation, reflexes intact  Extremities: No pitting edema lower extremities, +2 pulses  Skin: Dry, warm to touch, negative for any Rashes, No open wounds Wounds: per nursing documentation  LABs:  CBC Latest Ref Rng & Units 03/16/2018 03/14/2018 03/13/2018  WBC 4.0 - 10.5 K/uL 10.1 11.3(H) 11.0(H)  Hemoglobin 13.0 - 17.0 g/dL 9.8(L) 9.1(L) 9.9(L)  Hematocrit 39.0 - 52.0 % 30.6(L) 27.5(L) 31.1(L)  Platelets 150 - 400 K/uL 140(L) 119(L) 105(L)   CMP Latest Ref Rng & Units 03/16/2018 03/14/2018 03/13/2018  Glucose 70 - 99 mg/dL 172(H) 257(H) -  BUN 6 - 20 mg/dL 38(H) 59(H) -  Creatinine 0.61 - 1.24 mg/dL 6.02(H) 7.40(H) 5.74(H)  Sodium 135 - 145 mmol/L 132(L) 131(L) -  Potassium 3.5 - 5.1 mmol/L 3.8 3.7 -  Chloride 98 - 111 mmol/L 95(L) 95(L) -  CO2 22 - 32 mmol/L 24 25 -  Calcium 8.9 - 10.3 mg/dL 8.9 8.2(L) -  Total Protein 6.5 - 8.1 g/dL - - -  Total  Bilirubin 0.3 - 1.2 mg/dL - - -  Alkaline Phos 38 - 126 U/L - - -  AST 15 - 41 U/L - - -  ALT 17 - 63 U/L - - -

## 2018-03-16 NOTE — Progress Notes (Signed)
Physical Therapy Treatment Patient Details Name: Russell Price MRN: [id number] DOB: Sep 23, 1960 Today's Date: 03/16/2018    History of Present Illness Pt is a 57 y/o male with PMH significant forESRD on HD (TTS schedule), HTN, DMII, CAD s/p cath (no stent) in 2017, glaucoma and blindness in the right eye who presented to ED with low back pain and bilateral leg weakness. Patient reports spontaneous onset of bilateral low back pain 2 days ago. MRI revealed disc degeneration most notable at L5-S1 where there is severe L neural foraminal stenosis due to a disc extrusion.     PT Comments    Pt progressing slowly towards physical therapy goals. He continues to be limited by pain and today reports an "electric shock" through his back while he was sleeping this morning. Pt had increased difficulty performing proper log roll technique with HOB flat and rails lowered to simulate home environment. Will continue to follow and progress as able per POC.   Follow Up Recommendations  Home health PT;Supervision/Assistance - 24 hour     Equipment Recommendations  Rolling walker with 5" wheels;3in1 (PT)    Recommendations for Other Services       Precautions / Restrictions Precautions Precautions: Fall Restrictions Weight Bearing Restrictions: No    Mobility  Bed Mobility Overal bed mobility: Needs Assistance Bed Mobility: Rolling;Sidelying to Sit;Sit to Sidelying Rolling: Min assist Sidelying to sit: Min assist       General bed mobility comments: VC's for proper log roll technique. HOB flat and rails lowered to simulate home environment. Pt required assist to complete full roll to side, to fully lower LE's off EOB, and to elevate trunk to full sitting position.   Transfers Overall transfer level: Needs assistance Equipment used: Rolling walker (2 wheeled) Transfers: Sit to/from Stand Sit to Stand: Mod assist         General transfer comment: VC's for hand placement on seated surface  for safety. Without pulling up on walker, pt required mod assist for power-up to full stand from low sitting surface  Ambulation/Gait Ambulation/Gait assistance: Min guard Gait Distance (Feet): 100 Feet Assistive device: Rolling walker (2 wheeled) Gait Pattern/deviations: Step-through pattern;Decreased stride length;Trunk flexed;Antalgic Gait velocity: Decreased Gait velocity interpretation: 1.31 - 2.62 ft/sec, indicative of limited community ambulator General Gait Details: Pt needed cues for proximity to RW as he would push RW out too far at times.  Pt also needing cues to stand upright as he would incr trunk flexion when fatigued.  Pt very happy as he states he couldn't walk like he is now yesterday. Wife encouraging pt for distance, however pt reports pain is too severe.    Stairs             Wheelchair Mobility    Modified Rankin (Stroke Patients Only)       Balance Overall balance assessment: Needs assistance Sitting-balance support: No upper extremity supported;Feet supported Sitting balance-Leahy Scale: Fair     Standing balance support: Bilateral upper extremity supported;During functional activity Standing balance-Leahy Scale: Poor Standing balance comment: relies on heavy UE support for balance.                             Cognition Arousal/Alertness: Awake/alert Behavior During Therapy: WFL for tasks assessed/performed Overall Cognitive Status: Within Functional Limits for tasks assessed  Exercises      General Comments        Pertinent Vitals/Pain Pain Assessment: 0-10 Pain Score: 7  Pain Location: back at rest in supine Pain Descriptors / Indicators: Aching;Grimacing;Guarding Pain Intervention(s): Limited activity within patient's tolerance;Monitored during session;Repositioned    Home Living                      Prior Function            PT Goals (current goals can  now be found in the care plan section) Acute Rehab PT Goals Patient Stated Goal: Decrease pain PT Goal Formulation: With patient Time For Goal Achievement: 03/29/18 Potential to Achieve Goals: Good Progress towards PT goals: Progressing toward goals    Frequency    Min 3X/week      PT Plan Current plan remains appropriate    Co-evaluation              AM-PAC PT "6 Clicks" Daily Activity  Outcome Measure  Difficulty turning over in bed (including adjusting bedclothes, sheets and blankets)?: None Difficulty moving from lying on back to sitting on the side of the bed? : None Difficulty sitting down on and standing up from a chair with arms (e.g., wheelchair, bedside commode, etc,.)?: A Little Help needed moving to and from a bed to chair (including a wheelchair)?: A Little Help needed walking in hospital room?: A Little Help needed climbing 3-5 steps with a railing? : A Lot 6 Click Score: 19    End of Session Equipment Utilized During Treatment: Gait belt Activity Tolerance: Patient limited by fatigue;Patient limited by pain Patient left: with call bell/phone within reach;in bed;with family/visitor present Nurse Communication: Mobility status PT Visit Diagnosis: Unsteadiness on feet (R26.81);Muscle weakness (generalized) (M62.81);Pain Pain - part of body: (back)     Time: 5176-1607 PT Time Calculation (min) (ACUTE ONLY): 20 min  Charges:  $Gait Training: 8-22 mins                     Rolinda Roan, PT, DPT Acute Rehabilitation Services Pager: [phone] Office: [phone]    Thelma Comp 03/16/2018, 12:18 PM

## 2018-03-17 DIAGNOSIS — N186 End stage renal disease: Secondary | ICD-10-CM

## 2018-03-17 DIAGNOSIS — Z992 Dependence on renal dialysis: Secondary | ICD-10-CM

## 2018-03-17 DIAGNOSIS — E1122 Type 2 diabetes mellitus with diabetic chronic kidney disease: Secondary | ICD-10-CM

## 2018-03-17 DIAGNOSIS — I120 Hypertensive chronic kidney disease with stage 5 chronic kidney disease or end stage renal disease: Secondary | ICD-10-CM

## 2018-03-17 LAB — CBC
HEMATOCRIT: 27.2 % — AB (ref 39.0–52.0)
Hemoglobin: 8.8 g/dL — ABNORMAL LOW (ref 13.0–17.0)
MCH: 30.4 pg (ref 26.0–34.0)
MCHC: 32.4 g/dL (ref 30.0–36.0)
MCV: 94.1 fL (ref 80.0–100.0)
PLATELETS: 151 10*3/uL (ref 150–400)
RBC: 2.89 MIL/uL — ABNORMAL LOW (ref 4.22–5.81)
RDW: 14.4 % (ref 11.5–15.5)
WBC: 6.7 10*3/uL (ref 4.0–10.5)
nRBC: 0 % (ref 0.0–0.2)

## 2018-03-17 LAB — BASIC METABOLIC PANEL
Anion gap: 13 (ref 5–15)
BUN: 52 mg/dL — AB (ref 6–20)
CALCIUM: 8.7 mg/dL — AB (ref 8.9–10.3)
CO2: 24 mmol/L (ref 22–32)
Chloride: 93 mmol/L — ABNORMAL LOW (ref 98–111)
Creatinine, Ser: 7.36 mg/dL — ABNORMAL HIGH (ref 0.61–1.24)
GFR calc Af Amer: 8 mL/min — ABNORMAL LOW (ref >60)
GFR, EST NON AFRICAN AMERICAN: 7 mL/min — AB (ref >60)
GLUCOSE: 165 mg/dL — AB (ref 70–99)
Potassium: 3.9 mmol/L (ref 3.5–5.1)
Sodium: 130 mmol/L — ABNORMAL LOW (ref 135–145)

## 2018-03-17 LAB — GLUCOSE, CAPILLARY
Glucose-Capillary: 149 mg/dL — ABNORMAL HIGH (ref 70–99)
Glucose-Capillary: 113 mg/dL — ABNORMAL HIGH (ref 70–99)
Glucose-Capillary: 164 mg/dL — ABNORMAL HIGH (ref 70–99)
Glucose-Capillary: 160 mg/dL — ABNORMAL HIGH (ref 70–99)

## 2018-03-17 LAB — URINE CULTURE: CULTURE: NO GROWTH

## 2018-03-17 LAB — ALBUMIN: Albumin: 2.4 g/dL — ABNORMAL LOW (ref 3.5–5.0)

## 2018-03-17 LAB — PHOSPHORUS: Phosphorus: 5.4 mg/dL — ABNORMAL HIGH (ref 2.5–4.6)

## 2018-03-17 MED ORDER — HEPARIN SODIUM (PORCINE) 1000 UNIT/ML IJ SOLN
INTRAMUSCULAR | Status: AC
  Administered 2018-03-17: 5000 [IU] via INTRAVENOUS_CENTRAL
  Filled 2018-03-17: qty 7

## 2018-03-17 MED ORDER — CEFAZOLIN SODIUM-DEXTROSE 2-4 GM/100ML-% IV SOLN
2.0000 g | INTRAVENOUS | Status: DC
  Administered 2018-03-21: 2 g via INTRAVENOUS
  Filled 2018-03-17 (×3): qty 100

## 2018-03-17 MED ORDER — PENTAFLUOROPROP-TETRAFLUOROETH EX AERO: 1.0000 "application " | INHALATION_SPRAY | CUTANEOUS | Status: DC | PRN

## 2018-03-17 MED ORDER — DOXERCALCIFEROL 4 MCG/2ML IV SOLN
INTRAVENOUS | Status: AC
  Administered 2018-03-17: 3 ug via INTRAVENOUS
  Filled 2018-03-17: qty 2

## 2018-03-17 MED ORDER — HEPARIN SODIUM (PORCINE) 1000 UNIT/ML DIALYSIS
5000.0000 [IU] | Freq: Once | INTRAMUSCULAR | Status: AC
  Administered 2018-03-17: 5000 [IU] via INTRAVENOUS_CENTRAL

## 2018-03-17 MED ORDER — POLYETHYLENE GLYCOL 3350 17 G PO PACK
17.0000 g | PACK | Freq: Every day | ORAL | Status: DC
  Administered 2018-03-17 – 2018-03-19 (×3): 17 g via ORAL
  Filled 2018-03-17 (×3): qty 1

## 2018-03-17 MED ORDER — SENNOSIDES-DOCUSATE SODIUM 8.6-50 MG PO TABS
1.0000 | ORAL_TABLET | Freq: Two times a day (BID) | ORAL | Status: DC
  Administered 2018-03-17 – 2018-03-19 (×5): 1 via ORAL
  Filled 2018-03-17 (×5): qty 1

## 2018-03-17 MED ORDER — SODIUM CHLORIDE 0.9 % IV SOLN: 100.0000 mL | INTRAVENOUS | Status: DC | PRN

## 2018-03-17 MED ORDER — LIDOCAINE-PRILOCAINE 2.5-2.5 % EX CREA: 1.0000 "application " | TOPICAL_CREAM | CUTANEOUS | Status: DC | PRN

## 2018-03-17 MED ORDER — LIDOCAINE HCL (PF) 1 % IJ SOLN: 5.0000 mL | INTRAMUSCULAR | Status: DC | PRN

## 2018-03-17 MED ORDER — DARBEPOETIN ALFA 40 MCG/0.4ML IJ SOSY
PREFILLED_SYRINGE | INTRAMUSCULAR | Status: AC
  Administered 2018-03-17: 40 ug via INTRAVENOUS
  Filled 2018-03-17: qty 0.4

## 2018-03-17 MED ORDER — OXYCODONE HCL 5 MG PO TABS
ORAL_TABLET | ORAL | Status: AC
  Filled 2018-03-17: qty 2

## 2018-03-17 MED ORDER — HEPARIN SODIUM (PORCINE) 1000 UNIT/ML DIALYSIS
1000.0000 [IU] | INTRAMUSCULAR | Status: DC | PRN
  Administered 2018-03-17: 1500 [IU] via INTRAVENOUS_CENTRAL

## 2018-03-17 NOTE — Progress Notes (Signed)
[facility] for Infectious Disease  Date of Admission:  03/12/2018   Total days of antibiotics 6        Day 4 of cefazolin  ASSESSMENT: Russell Price is a 57 yo with pmhx of ESRD on TTS, HTN, DMII who is being treated for strep gordonae bacteremia. MRI w/o definitive findings of discitis; lilely early vertebral infection given acute severe lower back pain. Tmax 100.7, improved from 102.3 yesterday. HTN but other vitals stable, leukocytosis improved 11.3-->10.1-->6.7. Clinically improving. UA not convincing for infection (rare bacteria, negative leukocytes, nitrites, or bacteria). UC and repeat blood culture pending.   Will plan on continuing x 6 weeks for presumed vertebral infection from last negative blood culture. His fistula is normal and functioning well. TTE negative.   PLAN: 1. Continue cefazolin  2. Follow up on blood and urine cultures  Principal Problem:   Streptococcal bacteremia Active Problems:   Essential hypertension   DM (diabetes mellitus) with complications (HCC)   Blind right eye   Debility   ESRD (end stage renal disease) (HCC)   CAD (coronary artery disease)   Fever   Acute low back pain   Bilateral leg weakness   Scheduled Meds: . amLODipine  2.5 mg Oral Daily  . atorvastatin  40 mg Oral q1800  . carvedilol  6.25 mg Oral BID WC  . Chlorhexidine Gluconate Cloth  6 each Topical Q0600  . Darbepoetin Alfa      . darbepoetin (ARANESP) injection - DIALYSIS  40 mcg Intravenous Q Tue-HD  . doxercalciferol      . doxercalciferol  3 mcg Intravenous Q T,Th,Sa-HD  . feeding supplement (NEPRO CARB STEADY)  237 mL Oral BID BM  . furosemide  80 mg Oral BID  . gabapentin  300 mg Oral Daily  . heparin  5,000 Units Subcutaneous Q8H  . insulin aspart  0-5 Units Subcutaneous QHS  . insulin aspart  0-9 Units Subcutaneous TID WC  . insulin glargine  10 Units Subcutaneous BID  . multivitamin  1 tablet Oral QHS  . sevelamer carbonate  800 mg Oral TID WC    . timolol  1 drop Left Eye BID   Continuous Infusions: . sodium chloride 10 mL/hr at 03/13/18 1738  . sodium chloride    . sodium chloride    .  ceFAZolin (ANCEF) IV Stopped (03/14/18 1932)   PRN Meds:.sodium chloride, sodium chloride, sodium chloride, acetaminophen, bisacodyl, cyclobenzaprine, heparin, HYDROmorphone (DILAUDID) injection, lidocaine (PF), lidocaine-prilocaine, oxyCODONE, pentafluoroprop-tetrafluoroeth, polyethylene glycol, sorbitol   SUBJECTIVE: Pt reports he is doing okay, only complaint is back pain which he said got worse this morning after he woke up. He is currently receiving HD and tolerating well. Denies fevers/chills, no n/v/d. No abdominal pain  Review of Systems: Review of Systems  Constitutional: Positive for malaise/fatigue. Negative for chills and fever.  Respiratory: Negative for shortness of breath.   Cardiovascular: Negative for chest pain.  Gastrointestinal: Negative for abdominal pain, diarrhea, nausea and vomiting.  Musculoskeletal: Positive for back pain.  Skin: Negative for rash.    No Known Allergies  OBJECTIVE: Vitals:   03/17/18 0930 03/17/18 1000 03/17/18 1030 03/17/18 1100  BP: (!) 132/59 138/67 128/68 (!) 143/58  Pulse: 65 67 65 (!) 59  Resp:      Temp:      TempSrc:      SpO2:      Weight:       Body mass index is 23.15 kg/m.  Physical Exam  Constitutional: He is oriented to person, place, and time. He appears well-developed.  HENT:  Head: Normocephalic and atraumatic.  Eyes: EOM are normal.  Neck: Normal range of motion.  Cardiovascular: Normal rate, regular rhythm and normal heart sounds.  Pulmonary/Chest: Effort normal and breath sounds normal.  Abdominal: Soft.  Neurological: He is alert and oriented to person, place, and time.  Skin: Skin is warm and dry.    Lab Results Lab Results  Component Value Date   WBC 6.7 03/17/2018   HGB 8.8 (L) 03/17/2018   HCT 27.2 (L) 03/17/2018   MCV 94.1 03/17/2018   PLT 151  03/17/2018    Lab Results  Component Value Date   CREATININE 7.36 (H) 03/17/2018   BUN 52 (H) 03/17/2018   NA 130 (L) 03/17/2018   K 3.9 03/17/2018   CL 93 (L) 03/17/2018   CO2 24 03/17/2018    Lab Results  Component Value Date   ALT 18 07/21/2017   AST 18 07/21/2017   ALKPHOS 113 07/21/2017   BILITOT 1.1 07/21/2017     Microbiology: Recent Results (from the past 240 hour(s))  Urine Culture     Status: Abnormal   Collection Time: 03/13/18 12:54 AM  Result Value Ref Range Status   Specimen Description URINE, RANDOM  Final   Special Requests NONE  Final   Culture (A)  Final    <10,000 COLONIES/mL INSIGNIFICANT GROWTH Performed at [facility], [address], [place], [place] [address]    Report Status 03/14/2018 FINAL  Final  Blood culture (routine x 2)     Status: Abnormal   Collection Time: 03/13/18  1:24 AM  Result Value Ref Range Status   Specimen Description BLOOD RIGHT FOREARM  Final   Special Requests   Final    BOTTLES DRAWN AEROBIC AND ANAEROBIC Blood Culture results may not be optimal due to an inadequate volume of blood received in culture bottles   Culture  Setup Time   Final    GRAM POSITIVE COCCI IN BOTH AEROBIC AND ANAEROBIC BOTTLES CRITICAL RESULT CALLED TO, READ BACK BY AND VERIFIED WITHKarsten Ro [facility] 03/13/18 2239 JDW Performed at [facility], [place] [address], [place], [place] [address]    Culture STREPTOCOCCUS GORDONII (A)  Final   Report Status 03/15/2018 FINAL  Final   Organism ID, Bacteria STREPTOCOCCUS GORDONII  Final      Susceptibility   Streptococcus gordonii - MIC*    PENICILLIN 0.25 INTERMEDIATE Intermediate     CEFTRIAXONE 0.25 SENSITIVE Sensitive     ERYTHROMYCIN 2 RESISTANT Resistant     LEVOFLOXACIN 1 SENSITIVE Sensitive     VANCOMYCIN 0.5 SENSITIVE Sensitive     * STREPTOCOCCUS GORDONII  Blood Culture ID Panel (Reflexed)     Status: Abnormal   Collection Time: 03/13/18  1:24 AM  Result Value Ref Range Status    Enterococcus species NOT DETECTED NOT DETECTED Final   Listeria monocytogenes NOT DETECTED NOT DETECTED Final   Staphylococcus species NOT DETECTED NOT DETECTED Final   Staphylococcus aureus (BCID) NOT DETECTED NOT DETECTED Final   Streptococcus species DETECTED (A) NOT DETECTED Final    Comment: Not Enterococcus species, Streptococcus agalactiae, Streptococcus pyogenes, or Streptococcus pneumoniae. CRITICAL RESULT CALLED TO, READ BACK BY AND VERIFIED WITH: J [facility] 03/13/18 2239 JDW    Streptococcus agalactiae NOT DETECTED NOT DETECTED Final   Streptococcus pneumoniae NOT DETECTED NOT DETECTED Final   Streptococcus pyogenes NOT DETECTED NOT DETECTED  Final   Acinetobacter baumannii NOT DETECTED NOT DETECTED Final   Enterobacteriaceae species NOT DETECTED NOT DETECTED Final   Enterobacter cloacae complex NOT DETECTED NOT DETECTED Final   Escherichia coli NOT DETECTED NOT DETECTED Final   Klebsiella oxytoca NOT DETECTED NOT DETECTED Final   Klebsiella pneumoniae NOT DETECTED NOT DETECTED Final   Proteus species NOT DETECTED NOT DETECTED Final   Serratia marcescens NOT DETECTED NOT DETECTED Final   Haemophilus influenzae NOT DETECTED NOT DETECTED Final   Neisseria meningitidis NOT DETECTED NOT DETECTED Final   Pseudomonas aeruginosa NOT DETECTED NOT DETECTED Final   Candida albicans NOT DETECTED NOT DETECTED Final   Candida glabrata NOT DETECTED NOT DETECTED Final   Candida krusei NOT DETECTED NOT DETECTED Final   Candida parapsilosis NOT DETECTED NOT DETECTED Final   Candida tropicalis NOT DETECTED NOT DETECTED Final  Blood culture (routine x 2)     Status: Abnormal   Collection Time: 03/13/18  1:34 AM  Result Value Ref Range Status   Specimen Description BLOOD RIGHT HAND  Final   Special Requests   Final    BOTTLES DRAWN AEROBIC AND ANAEROBIC Blood Culture results may not be optimal due to an inadequate volume of blood received in culture bottles   Culture  Setup Time   Final     IN BOTH AEROBIC AND ANAEROBIC BOTTLES GRAM POSITIVE COCCI CRITICAL RESULT CALLED TO, READ BACK BY AND VERIFIED WITHLenna Sciara [facility] 03/13/18 2239 JDW    Culture STREPTOCOCCUS GORDONII (A)  Final   Report Status 03/15/2018 FINAL  Final    April  Dione Booze, [place] [facility] [place] [phone] pager   [phone] cell 03/17/2018, 11:36 AM   I have reviewed the medical student's note. I agree with the assessment and plan as outlined by April.  Janene Madeira, MSN, NP-C [facility] [email or web address] Pager: [phone] Office: [phone]

## 2018-03-17 NOTE — Progress Notes (Signed)
PT Cancellation Note  Patient Details Name: Russell Price MRN: [id number] DOB: 10-27-1960   Cancelled Treatment:    Reason Eval/Treat Not Completed: Patient at procedure or test/unavailable; patient in HD currently.  Will attempt another day.   Reginia Naas 03/17/2018, 10:20 AM Magda Kiel, PT Acute Rehabilitation Services [phone] 03/17/2018

## 2018-03-17 NOTE — Progress Notes (Signed)
At approximately 18:30, pts wife Claudine Mouton, came to the nurses station and said help me please. I entered the room and pt was sitting on floor, with arms resting on walker, back resting on door jam. This nurse asked pt if he hit his head, pt stated no. Pt stated " my legs were weak and I couldn't hold myself up" this nurse asked Shanel NT to assist with helping him up off the floor. A chair was placed under the pts bottom and he was slid to the bedside and assisted back into bed with help from [place] NT and this nurse. VS were taken and were wnl, bed alarm is on, and pt educated again on the importance of using his call bell for assistance. Dr. Tana Coast notified of the fall

## 2018-03-17 NOTE — Progress Notes (Signed)
[facility]                                                                              Patient Demographics  Russell Price, is a 57 y.o. male, DOB - 1960-10-17, YIF:[id number]  Admit date - 03/12/2018   Admitting Physician Shela Leff, MD  Outpatient Primary MD for the patient is Charlott Rakes, MD  Outpatient specialists:   LOS - 2  days   Medical records reviewed and are as summarized below:    Chief Complaint  Patient presents with  . Back Pain       Brief summary  Russell Price an 57 y.o.malewith medical history significant forESRD on TTS, hypertension, DM 2, CAD status post CAD (no stent) in 02/2016, glaucoma and blindness in the right eye who presented to ED with low back pain and bilateral leg weakness. History provided by patient and patient's wife at bedside. Patient reports spontaneous onset of bilateral low back pain 2 days ago. Denies history of trauma, injury or unusual activity. Never had similar pain before. Describes the pain as achy and spastic.Pain severity 8/10 before arrival and now. No radiation to his legs. Pain is worse with movement. No alleviating factor. Reports bilateral leg weakness and difficulty walking for the last 2 days. Denies numbness,tingling or saddle anesthesia.. Denies changes in regards to bowel or bladder habits.    Assessment & Plan    Principal Problem:   Streptococcal bacteremia -Blood cultures positive for strept Gordonii -ID following, TEE negative for endocarditis -MRI negative for discitis -Source unclear, continue IV Ancef, follow ID recommendations -Repeat blood cultures from 10/28, urine cultures, negative so far  Active Problems:  Acute low back pain -MRI lumbar spine showed stenosis with disc extrusion at L5-S1, no discitis -Per neurosurgery, MRI findings not severe enough for acute surgical intervention -Continue pain control    Essential hypertension BP fairly  stable    DM (diabetes mellitus) with complications (HCC) -Continue Lantus and sliding scale insulin  ESRD on hemodialysis, TTS -Nephrology consulted  Anemia of chronic disease secondary to ESRD -H&H stable  Constipation -Placed on scheduled Senokot S, MiraLAX, sorbitol as needed bowel regimen with opioid use for pain control   Code Status: *Full CODE STATUS DVT Prophylaxis:  heparin subcu Family Communication: Discussed in detail with the patient, all imaging results, lab results explained to the patient    Disposition Plan: When cleared by nephrology and ID  Time Spent in minutes   35 minutes  Procedures:  Hemodialysis MRI lumbar spine  Consultants:   Nephrology Infectious disease  Antimicrobials:      Medications  Scheduled Meds: . amLODipine  2.5 mg Oral Daily  . atorvastatin  40 mg Oral q1800  . carvedilol  6.25 mg Oral BID WC  . Chlorhexidine Gluconate Cloth  6 each Topical Q0600  . darbepoetin (ARANESP) injection - DIALYSIS  40 mcg Intravenous Q Tue-HD  . doxercalciferol  3 mcg Intravenous Q T,Th,Sa-HD  . feeding supplement (NEPRO CARB STEADY)  237 mL Oral BID BM  . furosemide  80 mg Oral BID  . gabapentin  300 mg Oral Daily  . heparin  5,000 Units Subcutaneous Q8H  . insulin aspart  0-5 Units Subcutaneous QHS  . insulin aspart  0-9 Units Subcutaneous TID WC  . insulin glargine  10 Units Subcutaneous BID  . multivitamin  1 tablet Oral QHS  . oxyCODONE      . sevelamer carbonate  800 mg Oral TID WC  . timolol  1 drop Left Eye BID   Continuous Infusions: . sodium chloride 10 mL/hr at 03/13/18 1738  . sodium chloride    . sodium chloride    .  ceFAZolin (ANCEF) IV 2 g (03/17/18 1143)   PRN Meds:.sodium chloride, sodium chloride, sodium chloride, acetaminophen, bisacodyl, cyclobenzaprine, heparin, HYDROmorphone (DILAUDID) injection, lidocaine (PF), lidocaine-prilocaine, oxyCODONE, pentafluoroprop-tetrafluoroeth, polyethylene glycol,  sorbitol   Antibiotics   Anti-infectives (From admission, onward)   Start     Dose/Rate Route Frequency Ordered Stop   03/14/18 1800  ceFAZolin (ANCEF) IVPB 2g/100 mL premix     2 g 200 mL/hr over 30 Minutes Intravenous Every T-Th-Sa (Hemodialysis) 03/14/18 1620     03/14/18 1648  Vancomycin (VANCOCIN) 750-5 MG/150ML-% IVPB    Note to Pharmacy:  Cherylann Banas   : cabinet override      03/14/18 1648 03/14/18 1832   03/14/18 1200  vancomycin (VANCOCIN) IVPB 750 mg/150 ml premix  Status:  Discontinued     750 mg 150 mL/hr over 60 Minutes Intravenous Every T-Th-Sa (Hemodialysis) 03/13/18 0824 03/14/18 1620   03/13/18 2200  ceFEPIme (MAXIPIME) 1 g in sodium chloride 0.9 % 100 mL IVPB  Status:  Discontinued     1 g 200 mL/hr over 30 Minutes Intravenous Every 24 hours 03/13/18 0824 03/14/18 1620   03/13/18 0200  ceFEPIme (MAXIPIME) 2 g in sodium chloride 0.9 % 100 mL IVPB     2 g 200 mL/hr over 30 Minutes Intravenous  Once 03/13/18 0153 03/13/18 0319   03/13/18 0200  metroNIDAZOLE (FLAGYL) IVPB 500 mg  Status:  Discontinued     500 mg 100 mL/hr over 60 Minutes Intravenous Every 8 hours 03/13/18 0153 03/15/18 1004   03/13/18 0200  vancomycin (VANCOCIN) IVPB 1000 mg/200 mL premix     1,000 mg 200 mL/hr over 60 Minutes Intravenous  Once 03/13/18 0153 03/13/18 0425        Subjective:   Russell Price was seen and examined today.  Seen during hemodialysis, back pain currently controlled while lying down. Patient denies dizziness, chest pain, shortness of breath, abdominal pain, N/V.   Objective:   Vitals:   03/17/18 1100 03/17/18 1130 03/17/18 1145 03/17/18 1200  BP: (!) 143/58 128/66 132/67 128/68  Pulse: (!) 59 69 71 68  Resp:      Temp:      TempSrc:      SpO2:      Weight:        Intake/Output Summary (Last 24 hours) at 03/17/2018 1246 Last data filed at 03/17/2018 0500 Gross per 24 hour  Intake 1100 ml  Output 725 ml  Net 375 ml     Wt Readings from Last 3  Encounters:  03/17/18 75.3 kg  12/15/17 72.2 kg  09/15/17 71.7 kg     Exam  General: Alert and oriented x 3, NAD  Eyes:   HEENT:  Atraumatic, normocephalic  Cardiovascular: S1 S2 auscultated, Regular rate and rhythm.  Respiratory: Clear to auscultation bilaterally  Gastrointestinal: Soft, nontender, nondistended, + bowel sounds  Ext: no pedal edema bilaterally  Neuro: AAOx3, no new deficits    Musculoskeletal: No digital cyanosis,  clubbing  Skin: No rashes  Psych: Normal affect and demeanor, alert and oriented x3    Data Reviewed:  I have personally reviewed following labs and imaging studies  Micro Results Recent Results (from the past 240 hour(s))  Urine Culture     Status: Abnormal   Collection Time: 03/13/18 12:54 AM  Result Value Ref Range Status   Specimen Description URINE, RANDOM  Final   Special Requests NONE  Final   Culture (A)  Final    <10,000 COLONIES/mL INSIGNIFICANT GROWTH Performed at [facility], [address] [place], [place] [address]    Report Status 03/14/2018 FINAL  Final  Blood culture (routine x 2)     Status: Abnormal   Collection Time: 03/13/18  1:24 AM  Result Value Ref Range Status   Specimen Description BLOOD RIGHT FOREARM  Final   Special Requests   Final    BOTTLES DRAWN AEROBIC AND ANAEROBIC Blood Culture results may not be optimal due to an inadequate volume of blood received in culture bottles   Culture  Setup Time   Final    GRAM POSITIVE COCCI IN BOTH AEROBIC AND ANAEROBIC BOTTLES CRITICAL RESULT CALLED TO, READ BACK BY AND VERIFIED WITHKarsten [facility] 03/13/18 2239 JDW Performed at [facility], [place] [address] [place], [place] [address]    Culture STREPTOCOCCUS GORDONII (A)  Final   Report Status 03/15/2018 FINAL  Final   Organism ID, Bacteria STREPTOCOCCUS GORDONII  Final      Susceptibility   Streptococcus gordonii - MIC*    PENICILLIN 0.25 INTERMEDIATE Intermediate     CEFTRIAXONE 0.25  SENSITIVE Sensitive     ERYTHROMYCIN 2 RESISTANT Resistant     LEVOFLOXACIN 1 SENSITIVE Sensitive     VANCOMYCIN 0.5 SENSITIVE Sensitive     * STREPTOCOCCUS GORDONII  Blood Culture ID Panel (Reflexed)     Status: Abnormal   Collection Time: 03/13/18  1:24 AM  Result Value Ref Range Status   Enterococcus species NOT DETECTED NOT DETECTED Final   Listeria monocytogenes NOT DETECTED NOT DETECTED Final   Staphylococcus species NOT DETECTED NOT DETECTED Final   Staphylococcus aureus (BCID) NOT DETECTED NOT DETECTED Final   Streptococcus species DETECTED (A) NOT DETECTED Final    Comment: Not Enterococcus species, Streptococcus agalactiae, Streptococcus pyogenes, or Streptococcus pneumoniae. CRITICAL RESULT CALLED TO, READ BACK BY AND VERIFIED WITH: J [facility] 03/13/18 2239 JDW    Streptococcus agalactiae NOT DETECTED NOT DETECTED Final   Streptococcus pneumoniae NOT DETECTED NOT DETECTED Final   Streptococcus pyogenes NOT DETECTED NOT DETECTED Final   Acinetobacter baumannii NOT DETECTED NOT DETECTED Final   Enterobacteriaceae species NOT DETECTED NOT DETECTED Final   Enterobacter cloacae complex NOT DETECTED NOT DETECTED Final   Escherichia coli NOT DETECTED NOT DETECTED Final   Klebsiella oxytoca NOT DETECTED NOT DETECTED Final   Klebsiella pneumoniae NOT DETECTED NOT DETECTED Final   Proteus species NOT DETECTED NOT DETECTED Final   Serratia marcescens NOT DETECTED NOT DETECTED Final   Haemophilus influenzae NOT DETECTED NOT DETECTED Final   Neisseria meningitidis NOT DETECTED NOT DETECTED Final   Pseudomonas aeruginosa NOT DETECTED NOT DETECTED Final   Candida albicans NOT DETECTED NOT DETECTED Final   Candida glabrata NOT DETECTED NOT DETECTED Final   Candida krusei NOT DETECTED NOT DETECTED Final   Candida parapsilosis NOT DETECTED NOT DETECTED Final   Candida tropicalis NOT DETECTED NOT DETECTED Final  Blood culture (routine x 2)  Status: Abnormal   Collection Time:  03/13/18  1:34 AM  Result Value Ref Range Status   Specimen Description BLOOD RIGHT HAND  Final   Special Requests   Final    BOTTLES DRAWN AEROBIC AND ANAEROBIC Blood Culture results may not be optimal due to an inadequate volume of blood received in culture bottles   Culture  Setup Time   Final    IN BOTH AEROBIC AND ANAEROBIC BOTTLES GRAM POSITIVE COCCI CRITICAL RESULT CALLED TO, READ BACK BY AND VERIFIED WITH: J [facility] PHARMD 03/13/18 2239 JDW    Culture STREPTOCOCCUS GORDONII (A)  Final   Report Status 03/15/2018 FINAL  Final  Culture, blood (routine x 2)     Status: None (Preliminary result)   Collection Time: 03/16/18  9:52 AM  Result Value Ref Range Status   Specimen Description BLOOD BLOOD RIGHT HAND  Final   Special Requests AEROBIC BOTTLE ONLY Blood Culture adequate volume  Final   Culture   Final    NO GROWTH 1 DAY Performed at [facility], [place] [address], [place], [place] [address]    Report Status PENDING  Incomplete  Culture, blood (routine x 2)     Status: None (Preliminary result)   Collection Time: 03/16/18  9:52 AM  Result Value Ref Range Status   Specimen Description BLOOD BLOOD RIGHT HAND  Final   Special Requests AEROBIC BOTTLE ONLY Blood Culture adequate volume  Final   Culture   Final    NO GROWTH 1 DAY Performed at [facility], [place] [address] [place], [place] [address]    Report Status PENDING  Incomplete    Radiology Reports Ct Abdomen Pelvis Wo Contrast  Result Date: 03/13/2018 CLINICAL DATA:  Back pain. Cancer or infection suspected. Dialysis patient. EXAM: CT ABDOMEN AND PELVIS WITHOUT CONTRAST TECHNIQUE: Multidetector CT imaging of the abdomen and pelvis was performed following the standard protocol without IV contrast. COMPARISON:  Abdominal radiograph 09/02/2015. Renal ultrasound 02/15/2015. No prior CT. FINDINGS: Lower chest: Hypoventilatory atelectasis right greater than left lung base. Normal heart size. There are coronary artery  calcifications. Minimal pericardial fluid without significant effusion. Hepatobiliary: Lack of IV contrast and motion artifact limits assessment. Allowing for these limitations, no focal hepatic abnormality. No gallstones, pericholecystic inflammation, or biliary dilatation. Pancreas: Grossly normal. No ductal dilatation or inflammation. Spleen: Upper normal in size spanning 13.7 cm cranial caudal. Adrenals/Urinary Tract: No adrenal nodule. No hydronephrosis. No significant perinephric edema. Dense renovascular calcifications. Urinary bladder is nondistended. Stomach/Bowel: Lack of enteric contrast and paucity of intra-abdominal fat limits bowel assessment. Stomach is partially distended. No bowel obstruction. No evidence of bowel inflammation or wall thickening. Moderate volume of stool throughout the colon with colonic tortuosity. Small volume of stool in the rectum. Normal appendix. Vascular/Lymphatic: Advanced vascular calcifications of the aorta and branches. No bulky adenopathy allowing for limitations. Reproductive: Prostate is unremarkable. Other: No ascites or free air.  No intra-abdominal fluid collection. Musculoskeletal: Degenerative disc disease at L5-S1. Small Schmorl's node superior endplate of L4. There are no acute or suspicious osseous abnormalities. IMPRESSION: 1. No acute findings in the abdomen/pelvis. No evidence of infection or malignancy on noncontrast exam. 2. Large colonic stool burden can be seen with constipation. 3. Mild splenomegaly, nonspecific. 4. Advanced vascular calcifications. Aortic Atherosclerosis (ICD10-I70.0). Electronically Signed   By: Keith Rake M.D.   On: 03/13/2018 04:33   Dg Orthopantogram  Result Date: 03/15/2018 CLINICAL DATA:  Strep bacteremia. EXAM: ORTHOPANTOGRAM/PANORAMIC COMPARISON:  None. FINDINGS:  No obvious dental caries. There is periapical lucency noted around the second right maxillary molar and also the first left maxillary molar. The mandibular  teeth appear normal. IMPRESSION: Periapical lucency noted around the upper molars bilaterally. No obvious dental caries. Electronically Signed   By: Marijo Sanes M.D.   On: 03/15/2018 16:55   Mr Lumbar Spine Wo Contrast  Result Date: 03/13/2018 CLINICAL DATA:  Low back pain radiating to both legs with difficulty walking. History of end-stage renal disease on dialysis. EXAM: MRI LUMBAR SPINE WITHOUT CONTRAST TECHNIQUE: Multiplanar, multisequence MR imaging of the lumbar spine was performed. No intravenous contrast was administered. COMPARISON:  CT abdomen and pelvis 03/13/2018 FINDINGS: Segmentation:  Standard. Alignment: Straightening of the normal lumbar lordosis. Trace retrolisthesis of L3 on L4. Vertebrae: Diminished bone marrow signal intensity diffusely which may be related to patient's history of end-stage renal disease and anemia. No suspicious focal osseous lesion or fracture. Small L4 superior endplate Schmorl's node with primarily chronic surrounding degenerative marrow changes. Conus medullaris and cauda equina: Conus extends to the T12-L1 level. Conus and cauda equina appear normal. Paraspinal and other soft tissues: Unremarkable. Disc levels: Disc desiccation from L3-4 to L5-S1. Mild disc space narrowing at L3-4 and L5-S1. L1-2: Negative. L2-3: Minimal disc bulging most notable in the left extraforaminal region. No stenosis. L3-4: Disc bulging, congenitally short pedicles, and slight facet and ligamentum flavum hypertrophy result in borderline lateral recess and borderline neural foraminal stenosis bilaterally without spinal stenosis. L4-5: Disc bulging and mild facet and ligamentum flavum hypertrophy result in borderline lateral recess and borderline left neural foraminal stenosis without spinal stenosis. A small central annular fissure is noted. L5-S1: Circumferential disc bulging, a left foraminal disc extrusion, and mild facet hypertrophy result in borderline right and severe left neural  foraminal stenosis with potential left L5 nerve root impingement. No significant spinal stenosis. IMPRESSION: 1. Disc degeneration most notable at L5-S1 where there is severe left neural foraminal stenosis due to a disc extrusion. 2. Mild disc and facet degeneration at L3-4 and L4-5 without significant stenosis. Electronically Signed   By: Logan Bores M.D.   On: 03/13/2018 07:59    Lab Data:  CBC: Recent Labs  Lab 03/12/18 1928 03/13/18 1158 03/14/18 1440 03/16/18 0533 03/17/18 0711  WBC 10.4 11.0* 11.3* 10.1 6.7  HGB 10.7* 9.9* 9.1* 9.8* 8.8*  HCT 32.6* 31.1* 27.5* 30.6* 27.2*  MCV 92.1 93.1 92.9 94.7 94.1  PLT 95* 105* 119* 140* 518   Basic Metabolic Panel: Recent Labs  Lab 03/12/18 1928 03/13/18 1158 03/14/18 1440 03/16/18 0533 03/17/18 0711  NA 135  --  131* 132* 130*  K 3.4*  --  3.7 3.8 3.9  CL 93*  --  95* 95* 93*  CO2 25  --  25 24 24   GLUCOSE 228*  --  257* 172* 165*  BUN 22*  --  59* 38* 52*  CREATININE 3.85* 5.74* 7.40* 6.02* 7.36*  CALCIUM 9.0  --  8.2* 8.9 8.7*  PHOS  --   --  4.3  --  5.4*   GFR: Estimated Creatinine Clearance: 11.5 mL/min (A) (by C-G formula based on SCr of 7.36 mg/dL (H)). Liver Function Tests: Recent Labs  Lab 03/14/18 1440 03/17/18 0711  ALBUMIN 2.3* 2.4*   No results for input(s): LIPASE, AMYLASE in the last 168 hours. No results for input(s): AMMONIA in the last 168 hours. Coagulation Profile: No results for input(s): INR, PROTIME in the last 168 hours. Cardiac Enzymes: No results for  input(s): CKTOTAL, CKMB, CKMBINDEX, TROPONINI in the last 168 hours. BNP (last 3 results) No results for input(s): PROBNP in the last 8760 hours. HbA1C: No results for input(s): HGBA1C in the last 72 hours. CBG: Recent Labs  Lab 03/16/18 0738 03/16/18 1154 03/16/18 1659 03/16/18 2140 03/17/18 0737  GLUCAP 191* 142* 148* 176* 149*   Lipid Profile: No results for input(s): CHOL, HDL, LDLCALC, TRIG, CHOLHDL, LDLDIRECT in the last 72  hours. Thyroid Function Tests: No results for input(s): TSH, T4TOTAL, FREET4, T3FREE, THYROIDAB in the last 72 hours. Anemia Panel: No results for input(s): VITAMINB12, FOLATE, FERRITIN, TIBC, IRON, RETICCTPCT in the last 72 hours. Urine analysis:    Component Value Date/Time   COLORURINE YELLOW 03/16/2018 1752   APPEARANCEUR HAZY (A) 03/16/2018 1752   LABSPEC 1.012 03/16/2018 1752   PHURINE 6.0 03/16/2018 1752   GLUCOSEU NEGATIVE 03/16/2018 1752   HGBUR SMALL (A) 03/16/2018 1752   BILIRUBINUR NEGATIVE 03/16/2018 1752   BILIRUBINUR negative 02/27/2015 1458   BILIRUBINUR neg 02/22/2013 1737   KETONESUR NEGATIVE 03/16/2018 1752   PROTEINUR 100 (A) 03/16/2018 1752   UROBILINOGEN 0.2 02/28/2015 0032   NITRITE NEGATIVE 03/16/2018 1752   LEUKOCYTESUR TRACE (A) 03/16/2018 1752     Jenina Moening M.D. [facility] 03/17/2018, 12:46 PM  Pager: [phone] Between 7am to 7pm - call Pager - [phone]  After 7pm go to [email or web address] - password [email or web address]  Call night coverage person covering after 7pm

## 2018-03-17 NOTE — Progress Notes (Signed)
[facility] Progress Note   Subjective: Seen on HD. No complaints.    Objective Vitals:   03/17/18 0820 03/17/18 0829 03/17/18 0835 03/17/18 0900  BP: (!) 153/90 (!) 154/64 (!) 148/70 (!) 147/67  Pulse: 67 67 66 66  Resp: 17  18 20   Temp: 98.8 F (37.1 C)     TempSrc: Oral     SpO2: 95%     Weight: 75.3 kg      Physical Exam General: Chronically ill appearing male in NAD Heart: S1,S2 RRR Lungs: CTAB Abdomen: Soft, NT Extremities: No LE Edema Dialysis Access: LUA AVG cannulated at present  Additional Objective Labs: Basic Metabolic Panel: Recent Labs  Lab 03/14/18 1440 03/16/18 0533 03/17/18 0711  NA 131* 132* 130*  K 3.7 3.8 3.9  CL 95* 95* 93*  CO2 25 24 24   GLUCOSE 257* 172* 165*  BUN 59* 38* 52*  CREATININE 7.40* 6.02* 7.36*  CALCIUM 8.2* 8.9 8.7*  PHOS 4.3  --  5.4*   Liver Function Tests: Recent Labs  Lab 03/14/18 1440 03/17/18 0711  ALBUMIN 2.3* 2.4*   No results for input(s): LIPASE, AMYLASE in the last 168 hours. CBC: Recent Labs  Lab 03/12/18 1928 03/13/18 1158 03/14/18 1440 03/16/18 0533 03/17/18 0711  WBC 10.4 11.0* 11.3* 10.1 6.7  HGB 10.7* 9.9* 9.1* 9.8* 8.8*  HCT 32.6* 31.1* 27.5* 30.6* 27.2*  MCV 92.1 93.1 92.9 94.7 94.1  PLT 95* 105* 119* 140* 151   Blood Culture    Component Value Date/Time   SDES BLOOD RIGHT HAND 03/13/2018 0134   SPECREQUEST  03/13/2018 0134    BOTTLES DRAWN AEROBIC AND ANAEROBIC Blood Culture results may not be optimal due to an inadequate volume of blood received in culture bottles   CULT STREPTOCOCCUS GORDONII (A) 03/13/2018 0134   REPTSTATUS 03/15/2018 FINAL 03/13/2018 0134    Cardiac Enzymes: No results for input(s): CKTOTAL, CKMB, CKMBINDEX, TROPONINI in the last 168 hours. CBG: Recent Labs  Lab 03/16/18 0738 03/16/18 1154 03/16/18 1659 03/16/18 2140 03/17/18 0737  GLUCAP 191* 142* 148* 176* 149*   Iron Studies: No results for input(s): IRON, TIBC, TRANSFERRIN, FERRITIN in  the last 72 hours. @lablastinr3 @ Studies/Results: Dg Orthopantogram  Result Date: 03/15/2018 CLINICAL DATA:  Strep bacteremia. EXAM: ORTHOPANTOGRAM/PANORAMIC COMPARISON:  None. FINDINGS: No obvious dental caries. There is periapical lucency noted around the second right maxillary molar and also the first left maxillary molar. The mandibular teeth appear normal. IMPRESSION: Periapical lucency noted around the upper molars bilaterally. No obvious dental caries. Electronically Signed   By: Marijo Sanes M.D.   On: 03/15/2018 16:55   Medications: . sodium chloride 10 mL/hr at 03/13/18 1738  . sodium chloride    . sodium chloride    .  ceFAZolin (ANCEF) IV Stopped (03/14/18 1932)   . amLODipine  2.5 mg Oral Daily  . atorvastatin  40 mg Oral q1800  . carvedilol  6.25 mg Oral BID WC  . Chlorhexidine Gluconate Cloth  6 each Topical Q0600  . darbepoetin (ARANESP) injection - DIALYSIS  40 mcg Intravenous Q Tue-HD  . doxercalciferol  3 mcg Intravenous Q T,Th,Sa-HD  . feeding supplement (NEPRO CARB STEADY)  237 mL Oral BID BM  . furosemide  80 mg Oral BID  . gabapentin  300 mg Oral Daily  . heparin  5,000 Units Subcutaneous Q8H  . insulin aspart  0-5 Units Subcutaneous QHS  . insulin aspart  0-9 Units Subcutaneous TID WC  . insulin glargine  10  Units Subcutaneous BID  . multivitamin  1 tablet Oral QHS  . sevelamer carbonate  800 mg Oral TID WC  . timolol  1 drop Left Eye BID     Dialysis Orders: TTS -NW 4.25hrs, BFR400, FUW721, EDW 71kg,2K/2.25Ca  Access:LU AVG Heparin5000 unit bolus, 1500 mid run Hectorol74mcg IV qHD  Assessment/Plan: 1. Strep gordonae bacteremia - source unclear. Repeat BC, UA, Urine cultures.Tmax 102.5 in last 24hrs, BC+strep gordonii.On IV Ancef. ID following TTE show no endocarditis. Primary/ID. MRI negative for discitis 2. Lower back pain - MRI w/o showed stenosis w/disc extrusion @L5 /S1 per radiology no indication of infection. Neurosurgery did  not think MRI findings severe enough for surgery, and MRI w/contrast due to severity of pt pain.[place] for MRI w/contrast if needed. 3. ESRD - On HD TTS schedule.HD today on schedule.  4. Hypertension/volume - BP elevated. On norvasc and coreg. Under EDW post HD 03/14/2018. Needs EDW lowered. UFG 2.5 today.  5. Anemia of CKD - Hgb 8.8. Follow trends.ESA ordered with HD today. 6. Secondary Hyperparathyroidism -Ca 8.7 Phos 5.4Continue Hectorol and renvela 7. Nutrition - Renal diet w/fluid restrictions once advanced.Renavite  Domenick Quebedeaux H. Kaveon Blatz NP-C 03/17/2018, 9:20 AM  [employer] [phone]

## 2018-03-18 LAB — BASIC METABOLIC PANEL
ANION GAP: 12 (ref 5–15)
BUN: 25 mg/dL — ABNORMAL HIGH (ref 6–20)
CHLORIDE: 95 mmol/L — AB (ref 98–111)
CO2: 26 mmol/L (ref 22–32)
Calcium: 9.1 mg/dL (ref 8.9–10.3)
Creatinine, Ser: 4.7 mg/dL — ABNORMAL HIGH (ref 0.61–1.24)
GFR calc Af Amer: 14 mL/min — ABNORMAL LOW (ref >60)
GFR calc non Af Amer: 12 mL/min — ABNORMAL LOW (ref >60)
GLUCOSE: 111 mg/dL — AB (ref 70–99)
POTASSIUM: 3.8 mmol/L (ref 3.5–5.1)
Sodium: 133 mmol/L — ABNORMAL LOW (ref 135–145)

## 2018-03-18 LAB — CBC
HEMATOCRIT: 30.2 % — AB (ref 39.0–52.0)
HEMOGLOBIN: 9.3 g/dL — AB (ref 13.0–17.0)
MCH: 29.5 pg (ref 26.0–34.0)
MCHC: 30.8 g/dL (ref 30.0–36.0)
MCV: 95.9 fL (ref 80.0–100.0)
NRBC: 0 % (ref 0.0–0.2)
Platelets: 170 10*3/uL (ref 150–400)
RBC: 3.15 MIL/uL — AB (ref 4.22–5.81)
RDW: 14.5 % (ref 11.5–15.5)
WBC: 7 10*3/uL (ref 4.0–10.5)

## 2018-03-18 LAB — GLUCOSE, CAPILLARY
GLUCOSE-CAPILLARY: 126 mg/dL — AB (ref 70–99)
Glucose-Capillary: 98 mg/dL (ref 70–99)
Glucose-Capillary: 136 mg/dL — ABNORMAL HIGH (ref 70–99)

## 2018-03-18 MED ORDER — LACTULOSE 10 GM/15ML PO SOLN: 20.0000 g | Freq: Two times a day (BID) | ORAL | 0 refills | Status: DC | PRN

## 2018-03-18 MED ORDER — SENNOSIDES-DOCUSATE SODIUM 8.6-50 MG PO TABS: 1.0000 | ORAL_TABLET | Freq: Every evening | ORAL | 0 refills | Status: DC | PRN

## 2018-03-18 MED ORDER — OXYCODONE HCL 5 MG PO TABS: 5.0000 mg | ORAL_TABLET | Freq: Four times a day (QID) | ORAL | 0 refills | Status: AC | PRN

## 2018-03-18 MED ORDER — LACTULOSE 10 GM/15ML PO SOLN
20.0000 g | Freq: Three times a day (TID) | ORAL | Status: AC
  Administered 2018-03-18 – 2018-03-19 (×6): 20 g via ORAL
  Filled 2018-03-18 (×6): qty 30

## 2018-03-18 MED ORDER — CEFAZOLIN SODIUM-DEXTROSE 2-4 GM/100ML-% IV SOLN: 2.0000 g | INTRAVENOUS | 0 refills | Status: AC

## 2018-03-18 MED ORDER — BISACODYL 10 MG RE SUPP: 10.0000 mg | Freq: Every day | RECTAL | 0 refills | Status: DC | PRN

## 2018-03-18 MED ORDER — CYCLOBENZAPRINE HCL 10 MG PO TABS: 10.0000 mg | ORAL_TABLET | Freq: Three times a day (TID) | ORAL | 0 refills | Status: DC | PRN

## 2018-03-18 MED ORDER — POLYETHYLENE GLYCOL 3350 17 G PO PACK: 17.0000 g | PACK | Freq: Every day | ORAL | 0 refills | Status: DC

## 2018-03-18 NOTE — Care Management (Signed)
    Durable Medical Equipment  (From admission, onward)         Start     Ordered   03/18/18 0933  For home use only DME Hospital bed  Once    Question Answer Comment  The above medical condition requires: Patient requires the ability to reposition frequently   Bed type Semi-electric   Support Surface: Gel Overlay      03/18/18 0933   03/16/18 1314  For home use only DME Walker rolling  Once    Question:  Patient needs a walker to treat with the following condition  Answer:  Generalized weakness   03/16/18 1313   03/16/18 1314  For home use only DME 3 n 1  Once     03/16/18 1313

## 2018-03-18 NOTE — Care Management Note (Addendum)
Case Management Note  Patient Details  Name: [name] MRN: [id number] Date of Birth: July 16, 1960  Subjective/Objective:  Pt presented for low back pain. PTA from home with the support of wife. Pt is now on disability- 2/2 HD. Wife has to work and she admitted to having difficulty affording food since food stamps decreased and having difficulty paying bills. CM did consult CSW to see if she can assist with resources.  Pt utilizes [facility] for transportation to HD. Per wife pt should be able to transport home via car. Patient states he is having increased back pain. CM did discuss the option of Ambulance home, however the patient will receive the bill.                Action/Plan: CM discussed HH care/DME needs. Patient in need of [place] [facility] at this time. Agency list provided and patient chose [facility] for Services. Referral made to [facility] with [facility] and [place] to begin within 24-48 hours post transition home. Per patient DME can be obtained from [facility]. Referral sent to [facility] with [facility]. Hospital Bed may not be delivered today. No further needs at this time.   Expected Discharge Date:                  Expected Discharge Plan:  [place]  In-House Referral:  Clinical Social Work([employer] [facility] [employer] [facility]/ DSS)  Discharge planning Services  CM Consult  Post Acute Care Choice:  Durable Medical Equipment, Home Health Choice offered to:  Patient, Spouse  DME Arranged:  3-N-1, Walker rolling, Hospital bed DME Agency:  [place]:  PT [place]:  [facility] (formerly [facility])  Status of Service:  Completed, signed off  If discussed at [employer], dates discussed:    Additional Comments: 1201 03-19-18 Jacqlyn Krauss, RN, BSN [phone] CSW spoke with patient and he is agreeable to SNF. CSW is working the patient up for SNF. Hospital Bed and DME has to be cancelled since plan for SNF. CM did  make [facility] aware that patient will transition to SNF and [facility] can follow up afterwards. No further needs from CM at this time.      1113 03-19-18 Jacqlyn Krauss, RN, BSN [phone] CM called Charge RN this am because patient was still hospitalized- pt has d/c order. Per Charge RN patient did not leave because Hospital Bed had not been delivered. Patient was able to sleep in his own bed until hospital bed was delivered to the home. [facility] was notified and Hospital Bed to be delivered between 11:00 am- 3:00 pm. CM did speak with MD today in regards to plan of care. Patient provided a bowel regimen since no BM in days. PT re-consulted and recommendations for SNF- patient ambulated 25 feet and c/o increased back pain. MD to have patient follow up outpatient with neuro surgery. Hospital bed. CM discussed with CSW to see if patient is agreeable to SNF- awaiting to hear back from [place].  Bethena Roys, RN 03/18/2018, 11:26 AM

## 2018-03-18 NOTE — Progress Notes (Addendum)
[facility] Progress Note   Subjective: C/O L lower back pain. Says he feel in room yesterday, still unable to walk.  Did have some back pain this morning  Objective Vitals:   03/17/18 1702 03/17/18 2014 03/18/18 0421 03/18/18 0729  BP: (!) 159/67 (!) 150/56 (!) 165/67 (!) 177/68  Pulse: 89 72 74 79  Resp: 18 18 18 18   Temp: 99.6 F (37.6 C) 99.3 F (37.4 C) 98.3 F (36.8 C) 99.5 F (37.5 C)  TempSrc: Oral Oral  Oral  SpO2: 98% 96% 98%   Weight:  72.6 kg     Physical Exam General: Chronically ill appearing male in NAD Heart: S1,S2 RRR Lungs: CTAB Abdomen: soft, NT Extremities: No LE Edema Dialysis Access: LUA AVG + bruit.  Examination of the back did not reveal any point tenderness  Additional Objective Labs: Basic Metabolic Panel: Recent Labs  Lab 03/14/18 1440 03/16/18 0533 03/17/18 0711 03/18/18 0657  NA 131* 132* 130* 133*  K 3.7 3.8 3.9 3.8  CL 95* 95* 93* 95*  CO2 25 24 24 26   GLUCOSE 257* 172* 165* 111*  BUN 59* 38* 52* 25*  CREATININE 7.40* 6.02* 7.36* 4.70*  CALCIUM 8.2* 8.9 8.7* 9.1  PHOS 4.3  --  5.4*  --    Liver Function Tests: Recent Labs  Lab 03/14/18 1440 03/17/18 0711  ALBUMIN 2.3* 2.4*   No results for input(s): LIPASE, AMYLASE in the last 168 hours. CBC: Recent Labs  Lab 03/13/18 1158 03/14/18 1440 03/16/18 0533 03/17/18 0711 03/18/18 0657  WBC 11.0* 11.3* 10.1 6.7 7.0  HGB 9.9* 9.1* 9.8* 8.8* 9.3*  HCT 31.1* 27.5* 30.6* 27.2* 30.2*  MCV 93.1 92.9 94.7 94.1 95.9  PLT 105* 119* 140* 151 170   Blood Culture    Component Value Date/Time   SDES URINE, CLEAN CATCH 03/16/2018 1715   SPECREQUEST NONE 03/16/2018 1715   CULT  03/16/2018 1715    NO GROWTH Performed at [facility], [place] [address] [place], [place] [address]    REPTSTATUS 03/17/2018 FINAL 03/16/2018 1715    Cardiac Enzymes: No results for input(s): CKTOTAL, CKMB, CKMBINDEX, TROPONINI in the last 168 hours. CBG: Recent Labs  Lab  03/17/18 0737 03/17/18 1419 03/17/18 1659 03/17/18 2014 03/18/18 0732  GLUCAP 149* 113* 164* 160* 98   Iron Studies: No results for input(s): IRON, TIBC, TRANSFERRIN, FERRITIN in the last 72 hours. @lablastinr3 @ Studies/Results: No results found. Medications: . sodium chloride 10 mL/hr at 03/13/18 1738  . [START ON 03/19/2018]  ceFAZolin (ANCEF) IV     . amLODipine  2.5 mg Oral Daily  . atorvastatin  40 mg Oral q1800  . carvedilol  6.25 mg Oral BID WC  . Chlorhexidine Gluconate Cloth  6 each Topical Q0600  . darbepoetin (ARANESP) injection - DIALYSIS  40 mcg Intravenous Q Tue-HD  . doxercalciferol  3 mcg Intravenous Q T,Th,Sa-HD  . feeding supplement (NEPRO CARB STEADY)  237 mL Oral BID BM  . furosemide  80 mg Oral BID  . gabapentin  300 mg Oral Daily  . heparin  5,000 Units Subcutaneous Q8H  . insulin aspart  0-5 Units Subcutaneous QHS  . insulin aspart  0-9 Units Subcutaneous TID WC  . insulin glargine  10 Units Subcutaneous BID  . lactulose  20 g Oral TID  . multivitamin  1 tablet Oral QHS  . polyethylene glycol  17 g Oral Daily  . senna-docusate  1 tablet Oral BID  . sevelamer carbonate  800 mg Oral TID WC  . timolol  1 drop Left Eye BID    Dialysis Orders: TTS -NW 4.25hrs, BFR400, OTR711, EDW 71kg,2K/2.25Ca  Access:LU AVG Heparin5000 unit bolus, 1500 mid run Hectorol39mcg IV qHD  Assessment/Plan: 1. Strepgordonaebacteremia - source unclear-presumed discitis. Repeat BC, UA, Urine cultures.Tmax 99.6in last 24hrs, BC+strep gordonii.On IV Ancef-continue Ancef 2 grams for 6weeks through 04/25/18 .ID followingTTE show no endocarditis. Primary/ID. MRI negative for discitis.  Will need to continue antibiotics until April 25, 2018 2. Lower back pain - MRI w/o showed stenosis w/disc extrusion @L5 /S1 per radiology no indication of infection.  No infection neurosurgery did not think MRI findings severe enough for surgery, and MRI w/contrast due to  severity of pt pain.[place] for MRI w/contrast if needed. 3. ESRD - On HD TTS schedule.Next HD as OP 10/31/19Hypertension/volume - BP elevated. On norvasc and coreg.HD 03/17/18 pre wt 75.3 kg Net UF 2.0 liters Post wt 72.6. Now slightly above OP EDW. Adjust on DC.   4. Anemia of CKD - Hgb 9.3. Follow trends.Aranesp 40 mcg IV with HD 03/17/18 5. Secondary Hyperparathyroidism -Ca 8.7 Phos 5.4Continue Hectorol and renvela 6. Nutrition - Albumin 2.4.Renal diet w/fluid restriction.Renavite   Disposition: Home today. Discussed with primary, will has PT see patient prior to DC.  Agree follow-up for back pain can be accomplished by his primary physician.  It does not appear to have any signs of infection or tumor on MRI.  Use of gadolinium contrast would be contraindicated with end-stage renal disease  [name] NP-C 03/18/2018, 10:50 AM  [employer] [phone]

## 2018-03-18 NOTE — Progress Notes (Signed)
Patient refused enema from day shift nurse and continues to refused enema for night shift nurse.Patient still has not had bowel movement.Will continue to monitor.

## 2018-03-18 NOTE — Discharge Summary (Signed)
Physician Discharge Summary  Russell Price VFI:[id number] DOB: August 12, 1960 DOA: 03/12/2018  PCP: Charlott Rakes, MD  Admit date: 03/12/2018 Discharge date: 03/18/2018  Admitted From: Home Disposition: Home with home health  Recommendations for Outpatient Follow-up:  1. Follow up with PCP in 1-2 weeks 2. Please obtain BMP/CBC in one week your next doctors visit.  3. 6 weeks of cefazolin with his dialysis until December 8 4. Follow-up outpatient with nephrology.  And also follow with outpatient infectious disease appointment will be made by their service.  Home Health: PT/OT/RN/social worker Equipment/Devices:Rolling walker, commode, bed Discharge Condition: Stable CODE STATUS: Full code Diet recommendation: Diabetic/renal  Brief/Interim Summary: 57 year old with history of ESRD TTS, essential hypertension, diabetes mellitus type 2, CAD, glaucoma, right eye blindness came to the hospital with complains of lower back pain and bilateral lower extremity weakness.  His pain onset was very acute.  MRI showed stenosis with disc extrusion of L5-S1 with possible concerns of discitis?Marland Kitchen  MRI findings were not severe enough for surgical intervention per neurosurgery.  Patient's blood culture ended up growing streptococcal bacteremia.  TEE was negative for endocarditis.  Infectious disease recommended treating with IV ceftezole and for about 6 weeks with Cefazolin during his dialysis until December 8.  Infectious disease will make outpatient follow-up with their service.  Due to weakness and back pain he was evaluated by physical therapy and was recommended he would benefit from home PT. After my evaluation and speaking with the patient and his wife it appeared that patient would benefit from home nursing as well to help manage his medical conditions especially while he is getting IV antibiotics.  He would also require some homemade and outpatient social worker to assist with his needs.  He is reached  maximum benefit from an hospital stay and stable to be discharged.   Discharge Diagnoses:  Principal Problem:   Streptococcal bacteremia Active Problems:   Essential hypertension   DM (diabetes mellitus) with complications (HCC)   Blind right eye   Debility   ESRD (end stage renal disease) ([place])   CAD (coronary artery disease)   Fever   Acute low back pain   Bilateral leg weakness  Streptococcus bacteremia Lower back pain, possible discitis - Blood cultures were positive for Streptococcus gordonii, TEE was negative for endocarditis.  Repeat blood cultures remain negative at the time of discharge.  MRI of the spine showed stenosis with disc extrusion on L5-S1 but no obvious discitis.  Neurosurgery did not feel like patient required emergent/acute surgical intervention and recommended pain control. -Infectious disease recommended continuing IV Ancef with dialysis until December 8.  Generalized weakness and low back pain - PT/OT recommending home PT.  Due to back pain causing him ambulatory dysfunction, will make arrangements for some equipments for patient at home.  Will prescribe pain medication and Flexeril.  Bowel regimen will also be prescribed  Constipation - Aggressive bowel regimen  Diabetes mellitus type 2 -Resume home diabetic medications  ESRD on hemodialysis TTS - Nephrology will continue following the patient outpatient.  Appreciate their input.  Resume home renal medications  Anemia of chronic disease -This is secondary to renal issues, hemoglobin is stable.  Patient was on subcutaneous insulin while here Full code Wife is at the bedside, discharge the patient today once appropriate arrangements are made.  Discharge Instructions   Allergies as of 03/18/2018   No Known Allergies     Medication List    TAKE these medications   accu-chek softclix lancets Use as  instructed 3 times daily before meals and at bedtime. E11.9   ACCU-CHEK SOFTCLIX LANCETS  lancets Use as instructed 3 times daily before meals and at bedtime. E11.9   amLODipine 2.5 MG tablet Commonly known as:  NORVASC Take 1 tablet (2.5 mg total) by mouth daily.   aspirin 81 MG chewable tablet Chew 1 tablet (81 mg total) by mouth daily. What changed:  when to take this   atorvastatin 40 MG tablet Commonly known as:  LIPITOR Take 1 tablet (40 mg total) by mouth daily at 6 PM.   b complex-vitamin c-folic acid 0.8 MG Tabs tablet Take 1 tablet by mouth at bedtime.   bisacodyl 10 MG suppository Commonly known as:  DULCOLAX Place 1 suppository (10 mg total) rectally daily as needed for moderate constipation.   calcitRIOL 0.25 MCG capsule Commonly known as:  ROCALTROL Take 1 capsule (0.25 mcg total) by mouth daily.   carvedilol 6.25 MG tablet Commonly known as:  COREG TAKE 2 TABLETS BY MOUTH 2 TIMES DAILY WITH A MEAL. What changed:  See the new instructions.   ceFAZolin 2-4 GM/100ML-% IVPB Commonly known as:  ANCEF Inject 100 mLs (2 g total) into the vein every Tuesday, Thursday, and Saturday at 6 PM. Start taking on:  03/19/2018   cyclobenzaprine 10 MG tablet Commonly known as:  FLEXERIL Take 1 tablet (10 mg total) by mouth 3 (three) times daily as needed for up to 30 doses for muscle spasms.   Darbepoetin Alfa 150 MCG/0.3ML Sosy injection Commonly known as:  ARANESP Inject 0.3 mLs (150 mcg total) into the vein every Saturday with hemodialysis.   feeding supplement (NEPRO CARB STEADY) Liqd Take 237 mLs by mouth 2 (two) times daily between meals.   furosemide 80 MG tablet Commonly known as:  LASIX TAKE 1 TABLET BY MOUTH 2 TIMES DAILY. What changed:  when to take this   gabapentin 300 MG capsule Commonly known as:  NEURONTIN Take 1 capsule (300 mg total) by mouth daily.   glucose blood test strip Use 3 times daily before meals and at bedtime. E11.9   hydrocortisone 2.5 % rectal cream Commonly known as:  ANUSOL-HC Place rectally 2 (two) times daily.    insulin aspart 100 UNIT/ML injection Commonly known as:  novoLOG 0-12 units 3 times daily before meals as well as per sliding scale   insulin glargine 100 UNIT/ML injection Commonly known as:  LANTUS Inject subcutaneously 10 units of Lantus in the morning and 15 units of Lantus in the evening What changed:    how much to take  how to take this  when to take this  additional instructions   lactulose 10 GM/15ML solution Commonly known as:  CHRONULAC Take 30 mLs (20 g total) by mouth 2 (two) times daily as needed for up to 7 days for moderate constipation or severe constipation.   ondansetron 4 MG tablet Commonly known as:  ZOFRAN Take 1 tablet (4 mg total) by mouth every 8 (eight) hours as needed for nausea or vomiting.   oxyCODONE 5 MG immediate release tablet Commonly known as:  Oxy IR/ROXICODONE Take 1 tablet (5 mg total) by mouth every 6 (six) hours as needed for up to 5 days for moderate pain or severe pain. What changed:  reasons to take this   polyethylene glycol packet Commonly known as:  MIRALAX / GLYCOLAX Take 17 g by mouth daily.   RENAGEL 800 MG tablet Generic drug:  sevelamer Take 800-1,600 mg by mouth See admin instructions.  Take 1600 mg by mouth 3 times daily with meals and take 800 mg by mouth twice daily with snacks   senna-docusate 8.6-50 MG tablet Commonly known as:  Senokot-S Take 1 tablet by mouth at bedtime as needed for mild constipation.   timolol 0.5 % ophthalmic solution Commonly known as:  TIMOPTIC Place 1 drop into the left eye 2 (two) times daily.   TRUE METRIX METER Devi 1 each by Does not apply route 3 (three) times daily.   ACCU-CHEK AVIVA device Use as instructed 3 times daily before meals and at bedtime. E11.9            Durable Medical Equipment  (From admission, onward)         Start     Ordered   03/18/18 0933  For home use only DME Hospital bed  Once    Question Answer Comment  The above medical condition requires:  Patient requires the ability to reposition frequently   Bed type Semi-electric   Support Surface: Gel Overlay      03/18/18 0933   03/16/18 1314  For home use only DME Walker rolling  Once    Question:  Patient needs a walker to treat with the following condition  Answer:  Generalized weakness   03/16/18 1313   03/16/18 1314  For home use only DME 3 n 1  Once     03/16/18 [place] Follow up.   Why:  Hospital Bed, Bedside Commode, Best boy information: [address] [place] [address] [phone]        [place]. Schedule an appointment as soon as possible for a visit in 1 week(s).   Contact information: [place] [address] [phone]         No Known Allergies  You were cared for by a hospitalist during your hospital stay. If you have any questions about your discharge medications or the care you received while you were in the hospital after you are discharged, you can call the unit and asked to speak with the hospitalist on call if the hospitalist that took care of you is not available. Once you are discharged, your primary care physician will handle any further medical issues. Please note that no refills for any discharge medications will be authorized once you are discharged, as it is imperative that you return to your primary care physician (or establish a relationship with a primary care physician if you do not have one) for your aftercare needs so that they can reassess your need for medications and monitor your lab values.  Consultations:  Infectious disease  Nephrology  Neurosurgery   Procedures/Studies: Ct Abdomen Pelvis Wo Contrast  Result Date: 03/13/2018 CLINICAL DATA:  Back pain. Cancer or infection suspected. Dialysis patient. EXAM: CT ABDOMEN AND PELVIS WITHOUT CONTRAST TECHNIQUE: Multidetector CT  imaging of the abdomen and pelvis was performed following the standard protocol without IV contrast. COMPARISON:  Abdominal radiograph 09/02/2015. Renal ultrasound 02/15/2015. No prior CT. FINDINGS: Lower chest: Hypoventilatory atelectasis right greater than left lung base. Normal heart size. There are coronary artery calcifications. Minimal pericardial fluid without significant effusion. Hepatobiliary: Lack of IV contrast and motion artifact limits assessment. Allowing for these limitations, no focal hepatic abnormality. No gallstones, pericholecystic inflammation, or biliary dilatation. Pancreas: Grossly normal. No ductal dilatation or inflammation. Spleen: Upper normal  in size spanning 13.7 cm cranial caudal. Adrenals/Urinary Tract: No adrenal nodule. No hydronephrosis. No significant perinephric edema. Dense renovascular calcifications. Urinary bladder is nondistended. Stomach/Bowel: Lack of enteric contrast and paucity of intra-abdominal fat limits bowel assessment. Stomach is partially distended. No bowel obstruction. No evidence of bowel inflammation or wall thickening. Moderate volume of stool throughout the colon with colonic tortuosity. Small volume of stool in the rectum. Normal appendix. Vascular/Lymphatic: Advanced vascular calcifications of the aorta and branches. No bulky adenopathy allowing for limitations. Reproductive: Prostate is unremarkable. Other: No ascites or free air.  No intra-abdominal fluid collection. Musculoskeletal: Degenerative disc disease at L5-S1. Small Schmorl's node superior endplate of L4. There are no acute or suspicious osseous abnormalities. IMPRESSION: 1. No acute findings in the abdomen/pelvis. No evidence of infection or malignancy on noncontrast exam. 2. Large colonic stool burden can be seen with constipation. 3. Mild splenomegaly, nonspecific. 4. Advanced vascular calcifications. Aortic Atherosclerosis (ICD10-I70.0). Electronically Signed   By: Keith Rake M.D.    On: 03/13/2018 04:33   Dg Orthopantogram  Result Date: 03/15/2018 CLINICAL DATA:  Strep bacteremia. EXAM: ORTHOPANTOGRAM/PANORAMIC COMPARISON:  None. FINDINGS: No obvious dental caries. There is periapical lucency noted around the second right maxillary molar and also the first left maxillary molar. The mandibular teeth appear normal. IMPRESSION: Periapical lucency noted around the upper molars bilaterally. No obvious dental caries. Electronically Signed   By: Marijo Sanes M.D.   On: 03/15/2018 16:55   Mr Lumbar Spine Wo Contrast  Result Date: 03/13/2018 CLINICAL DATA:  Low back pain radiating to both legs with difficulty walking. History of end-stage renal disease on dialysis. EXAM: MRI LUMBAR SPINE WITHOUT CONTRAST TECHNIQUE: Multiplanar, multisequence MR imaging of the lumbar spine was performed. No intravenous contrast was administered. COMPARISON:  CT abdomen and pelvis 03/13/2018 FINDINGS: Segmentation:  Standard. Alignment: Straightening of the normal lumbar lordosis. Trace retrolisthesis of L3 on L4. Vertebrae: Diminished bone marrow signal intensity diffusely which may be related to patient's history of end-stage renal disease and anemia. No suspicious focal osseous lesion or fracture. Small L4 superior endplate Schmorl's node with primarily chronic surrounding degenerative marrow changes. Conus medullaris and cauda equina: Conus extends to the T12-L1 level. Conus and cauda equina appear normal. Paraspinal and other soft tissues: Unremarkable. Disc levels: Disc desiccation from L3-4 to L5-S1. Mild disc space narrowing at L3-4 and L5-S1. L1-2: Negative. L2-3: Minimal disc bulging most notable in the left extraforaminal region. No stenosis. L3-4: Disc bulging, congenitally short pedicles, and slight facet and ligamentum flavum hypertrophy result in borderline lateral recess and borderline neural foraminal stenosis bilaterally without spinal stenosis. L4-5: Disc bulging and mild facet and ligamentum  flavum hypertrophy result in borderline lateral recess and borderline left neural foraminal stenosis without spinal stenosis. A small central annular fissure is noted. L5-S1: Circumferential disc bulging, a left foraminal disc extrusion, and mild facet hypertrophy result in borderline right and severe left neural foraminal stenosis with potential left L5 nerve root impingement. No significant spinal stenosis. IMPRESSION: 1. Disc degeneration most notable at L5-S1 where there is severe left neural foraminal stenosis due to a disc extrusion. 2. Mild disc and facet degeneration at L3-4 and L4-5 without significant stenosis. Electronically Signed   By: Logan Bores M.D.   On: 03/13/2018 07:59      Subjective: States his low back pain is better but still having difficulty getting around in the room and getting up from the commode.  Remains afebrile.  General = no fevers, chills, dizziness, malaise,  fatigue HEENT/EYES = negative for pain, redness, loss of vision, double vision, blurred vision, loss of hearing, sore throat, hoarseness, dysphagia Cardiovascular= negative for chest pain, palpitation, murmurs, lower extremity swelling Respiratory/lungs= negative for shortness of breath, cough, hemoptysis, wheezing, mucus production Gastrointestinal= negative for nausea, vomiting,, abdominal pain, melena, hematemesis Genitourinary= negative for Dysuria, Hematuria, Change in Urinary Frequency MSK = Negative for arthralgia, myalgias, Back Pain, Joint swelling  Neurology= Negative for headache, seizures, numbness, tingling  Psychiatry= Negative for anxiety, depression, suicidal and homocidal ideation Allergy/Immunology= Medication/Food allergy as listed  Skin= Negative for Rash, lesions, ulcers, itching   Discharge Exam: Vitals:   03/18/18 0421 03/18/18 0729  BP: (!) 165/67 (!) 177/68  Pulse: 74 79  Resp: 18 18  Temp: 98.3 F (36.8 C) 99.5 F (37.5 C)  SpO2: 98%    Vitals:   03/17/18 1702 03/17/18  2014 03/18/18 0421 03/18/18 0729  BP: (!) 159/67 (!) 150/56 (!) 165/67 (!) 177/68  Pulse: 89 72 74 79  Resp: 18 18 18 18   Temp: 99.6 F (37.6 C) 99.3 F (37.4 C) 98.3 F (36.8 C) 99.5 F (37.5 C)  TempSrc: Oral Oral  Oral  SpO2: 98% 96% 98%   Weight:  72.6 kg      General: Pt is alert, awake, not in acute distress Cardiovascular: RRR, S1/S2 +, no rubs, no gallops Respiratory: CTA bilaterally, no wheezing, no rhonchi Abdominal: Soft, NT, ND, bowel sounds + Extremities: no edema, no cyanosis    The results of significant diagnostics from this hospitalization (including imaging, microbiology, ancillary and laboratory) are listed below for reference.     Microbiology: Recent Results (from the past 240 hour(s))  Urine Culture     Status: Abnormal   Collection Time: 03/13/18 12:54 AM  Result Value Ref Range Status   Specimen Description URINE, RANDOM  Final   Special Requests NONE  Final   Culture (A)  Final    <10,000 COLONIES/mL INSIGNIFICANT GROWTH Performed at [facility], [address], [place], [place] [address]    Report Status 03/14/2018 FINAL  Final  Blood culture (routine x 2)     Status: Abnormal   Collection Time: 03/13/18  1:24 AM  Result Value Ref Range Status   Specimen Description BLOOD RIGHT FOREARM  Final   Special Requests   Final    BOTTLES DRAWN AEROBIC AND ANAEROBIC Blood Culture results may not be optimal due to an inadequate volume of blood received in culture bottles   Culture  Setup Time   Final    GRAM POSITIVE COCCI IN BOTH AEROBIC AND ANAEROBIC BOTTLES CRITICAL RESULT CALLED TO, READ BACK BY AND VERIFIED WITHKarsten Ro [facility] 03/13/18 2239 JDW Performed at [facility], [place] [address] [place], [place] [address]    Culture STREPTOCOCCUS GORDONII (A)  Final   Report Status 03/15/2018 FINAL  Final   Organism ID, Bacteria STREPTOCOCCUS GORDONII  Final      Susceptibility   Streptococcus gordonii - MIC*    PENICILLIN 0.25  INTERMEDIATE Intermediate     CEFTRIAXONE 0.25 SENSITIVE Sensitive     ERYTHROMYCIN 2 RESISTANT Resistant     LEVOFLOXACIN 1 SENSITIVE Sensitive     VANCOMYCIN 0.5 SENSITIVE Sensitive     * STREPTOCOCCUS GORDONII  Blood Culture ID Panel (Reflexed)     Status: Abnormal   Collection Time: 03/13/18  1:24 AM  Result Value Ref Range Status   Enterococcus species NOT DETECTED NOT DETECTED Final   Listeria monocytogenes  NOT DETECTED NOT DETECTED Final   Staphylococcus species NOT DETECTED NOT DETECTED Final   Staphylococcus aureus (BCID) NOT DETECTED NOT DETECTED Final   Streptococcus species DETECTED (A) NOT DETECTED Final    Comment: Not Enterococcus species, Streptococcus agalactiae, Streptococcus pyogenes, or Streptococcus pneumoniae. CRITICAL RESULT CALLED TO, READ BACK BY AND VERIFIED WITH: J LEDF[facility] 03/13/18 2239 JDW    Streptococcus agalactiae NOT DETECTED NOT DETECTED Final   Streptococcus pneumoniae NOT DETECTED NOT DETECTED Final   Streptococcus pyogenes NOT DETECTED NOT DETECTED Final   Acinetobacter baumannii NOT DETECTED NOT DETECTED Final   Enterobacteriaceae species NOT DETECTED NOT DETECTED Final   Enterobacter cloacae complex NOT DETECTED NOT DETECTED Final   Escherichia coli NOT DETECTED NOT DETECTED Final   Klebsiella oxytoca NOT DETECTED NOT DETECTED Final   Klebsiella pneumoniae NOT DETECTED NOT DETECTED Final   Proteus species NOT DETECTED NOT DETECTED Final   Serratia marcescens NOT DETECTED NOT DETECTED Final   Haemophilus influenzae NOT DETECTED NOT DETECTED Final   Neisseria meningitidis NOT DETECTED NOT DETECTED Final   Pseudomonas aeruginosa NOT DETECTED NOT DETECTED Final   Candida albicans NOT DETECTED NOT DETECTED Final   Candida glabrata NOT DETECTED NOT DETECTED Final   Candida krusei NOT DETECTED NOT DETECTED Final   Candida parapsilosis NOT DETECTED NOT DETECTED Final   Candida tropicalis NOT DETECTED NOT DETECTED Final  Blood culture (routine  x 2)     Status: Abnormal   Collection Time: 03/13/18  1:34 AM  Result Value Ref Range Status   Specimen Description BLOOD RIGHT HAND  Final   Special Requests   Final    BOTTLES DRAWN AEROBIC AND ANAEROBIC Blood Culture results may not be optimal due to an inadequate volume of blood received in culture bottles   Culture  Setup Time   Final    IN BOTH AEROBIC AND ANAEROBIC BOTTLES GRAM POSITIVE COCCI CRITICAL RESULT CALLED TO, READ BACK BY AND VERIFIED WITH: J [facility] PHARMD 03/13/18 2239 JDW    Culture STREPTOCOCCUS GORDONII (A)  Final   Report Status 03/15/2018 FINAL  Final  Culture, blood (routine x 2)     Status: None (Preliminary result)   Collection Time: 03/16/18  9:52 AM  Result Value Ref Range Status   Specimen Description BLOOD BLOOD RIGHT HAND  Final   Special Requests AEROBIC BOTTLE ONLY Blood Culture adequate volume  Final   Culture   Final    NO GROWTH 2 DAYS Performed at [facility], [address], [place], [place] [address]    Report Status PENDING  Incomplete  Culture, blood (routine x 2)     Status: None (Preliminary result)   Collection Time: 03/16/18  9:52 AM  Result Value Ref Range Status   Specimen Description BLOOD BLOOD RIGHT HAND  Final   Special Requests AEROBIC BOTTLE ONLY Blood Culture adequate volume  Final   Culture   Final    NO GROWTH 2 DAYS Performed at [facility], [place] [address] [place], [place] [address]    Report Status PENDING  Incomplete  Culture, Urine     Status: None   Collection Time: 03/16/18  5:15 PM  Result Value Ref Range Status   Specimen Description URINE, CLEAN CATCH  Final   Special Requests NONE  Final   Culture   Final    NO GROWTH Performed at [facility], [place] [address], [place], [place] [address]    Report Status 03/17/2018 FINAL  Final     Labs: BNP (last 3 results) No results for input(s): BNP in the last 8760 hours. Basic Metabolic Panel: Recent Labs  Lab 03/12/18 1928 03/13/18 1158  03/14/18 1440 03/16/18 0533 03/17/18 0711 03/18/18 0657  NA 135  --  131* 132* 130* 133*  K 3.4*  --  3.7 3.8 3.9 3.8  CL 93*  --  95* 95* 93* 95*  CO2 25  --  25 24 24 26   GLUCOSE 228*  --  257* 172* 165* 111*  BUN 22*  --  59* 38* 52* 25*  CREATININE 3.85* 5.74* 7.40* 6.02* 7.36* 4.70*  CALCIUM 9.0  --  8.2* 8.9 8.7* 9.1  PHOS  --   --  4.3  --  5.4*  --    Liver Function Tests: Recent Labs  Lab 03/14/18 1440 03/17/18 0711  ALBUMIN 2.3* 2.4*   No results for input(s): LIPASE, AMYLASE in the last 168 hours. No results for input(s): AMMONIA in the last 168 hours. CBC: Recent Labs  Lab 03/13/18 1158 03/14/18 1440 03/16/18 0533 03/17/18 0711 03/18/18 0657  WBC 11.0* 11.3* 10.1 6.7 7.0  HGB 9.9* 9.1* 9.8* 8.8* 9.3*  HCT 31.1* 27.5* 30.6* 27.2* 30.2*  MCV 93.1 92.9 94.7 94.1 95.9  PLT 105* 119* 140* 151 170   Cardiac Enzymes: No results for input(s): CKTOTAL, CKMB, CKMBINDEX, TROPONINI in the last 168 hours. BNP: Invalid input(s): POCBNP CBG: Recent Labs  Lab 03/17/18 1419 03/17/18 1659 03/17/18 2014 03/18/18 0732 03/18/18 1204  GLUCAP 113* 164* 160* 98 136*   D-Dimer No results for input(s): DDIMER in the last 72 hours. Hgb A1c No results for input(s): HGBA1C in the last 72 hours. Lipid Profile No results for input(s): CHOL, HDL, LDLCALC, TRIG, CHOLHDL, LDLDIRECT in the last 72 hours. Thyroid function studies No results for input(s): TSH, T4TOTAL, T3FREE, THYROIDAB in the last 72 hours.  Invalid input(s): FREET3 Anemia work up No results for input(s): VITAMINB12, FOLATE, FERRITIN, TIBC, IRON, RETICCTPCT in the last 72 hours. Urinalysis    Component Value Date/Time   COLORURINE YELLOW 03/16/2018 1752   APPEARANCEUR HAZY (A) 03/16/2018 1752   LABSPEC 1.012 03/16/2018 1752   PHURINE 6.0 03/16/2018 1752   GLUCOSEU NEGATIVE 03/16/2018 1752   HGBUR SMALL (A) 03/16/2018 1752   BILIRUBINUR NEGATIVE 03/16/2018 1752   BILIRUBINUR negative 02/27/2015 1458    BILIRUBINUR neg 02/22/2013 1737   KETONESUR NEGATIVE 03/16/2018 1752   PROTEINUR 100 (A) 03/16/2018 1752   UROBILINOGEN 0.2 02/28/2015 0032   NITRITE NEGATIVE 03/16/2018 1752   LEUKOCYTESUR TRACE (A) 03/16/2018 1752   Sepsis Labs Invalid input(s): PROCALCITONIN,  WBC,  LACTICIDVEN Microbiology Recent Results (from the past 240 hour(s))  Urine Culture     Status: Abnormal   Collection Time: 03/13/18 12:54 AM  Result Value Ref Range Status   Specimen Description URINE, RANDOM  Final   Special Requests NONE  Final   Culture (A)  Final    <10,000 COLONIES/mL INSIGNIFICANT GROWTH Performed at [facility], [place] [address], [place], [place] [address]    Report Status 03/14/2018 FINAL  Final  Blood culture (routine x 2)     Status: Abnormal   Collection Time: 03/13/18  1:24 AM  Result Value Ref Range Status   Specimen Description BLOOD RIGHT FOREARM  Final   Special Requests   Final    BOTTLES DRAWN AEROBIC AND ANAEROBIC Blood Culture results may not be optimal due to an inadequate volume of blood received in  culture bottles   Culture  Setup Time   Final    GRAM POSITIVE COCCI IN BOTH AEROBIC AND ANAEROBIC BOTTLES CRITICAL RESULT CALLED TO, READ BACK BY AND VERIFIED WITH: Karsten Ro [facility] 03/13/18 2239 JDW Performed at [facility], [place] [address] [place], [place] [address]    Culture STREPTOCOCCUS GORDONII (A)  Final   Report Status 03/15/2018 FINAL  Final   Organism ID, Bacteria STREPTOCOCCUS GORDONII  Final      Susceptibility   Streptococcus gordonii - MIC*    PENICILLIN 0.25 INTERMEDIATE Intermediate     CEFTRIAXONE 0.25 SENSITIVE Sensitive     ERYTHROMYCIN 2 RESISTANT Resistant     LEVOFLOXACIN 1 SENSITIVE Sensitive     VANCOMYCIN 0.5 SENSITIVE Sensitive     * STREPTOCOCCUS GORDONII  Blood Culture ID Panel (Reflexed)     Status: Abnormal   Collection Time: 03/13/18  1:24 AM  Result Value Ref Range Status   Enterococcus species NOT DETECTED NOT DETECTED  Final   Listeria monocytogenes NOT DETECTED NOT DETECTED Final   Staphylococcus species NOT DETECTED NOT DETECTED Final   Staphylococcus aureus (BCID) NOT DETECTED NOT DETECTED Final   Streptococcus species DETECTED (A) NOT DETECTED Final    Comment: Not Enterococcus species, Streptococcus agalactiae, Streptococcus pyogenes, or Streptococcus pneumoniae. CRITICAL RESULT CALLED TO, READ BACK BY AND VERIFIED WITH: J LEDFORD [facility] 03/13/18 2239 JDW    Streptococcus agalactiae NOT DETECTED NOT DETECTED Final   Streptococcus pneumoniae NOT DETECTED NOT DETECTED Final   Streptococcus pyogenes NOT DETECTED NOT DETECTED Final   Acinetobacter baumannii NOT DETECTED NOT DETECTED Final   Enterobacteriaceae species NOT DETECTED NOT DETECTED Final   Enterobacter cloacae complex NOT DETECTED NOT DETECTED Final   Escherichia coli NOT DETECTED NOT DETECTED Final   Klebsiella oxytoca NOT DETECTED NOT DETECTED Final   Klebsiella pneumoniae NOT DETECTED NOT DETECTED Final   Proteus species NOT DETECTED NOT DETECTED Final   Serratia marcescens NOT DETECTED NOT DETECTED Final   Haemophilus influenzae NOT DETECTED NOT DETECTED Final   Neisseria meningitidis NOT DETECTED NOT DETECTED Final   Pseudomonas aeruginosa NOT DETECTED NOT DETECTED Final   Candida albicans NOT DETECTED NOT DETECTED Final   Candida glabrata NOT DETECTED NOT DETECTED Final   Candida krusei NOT DETECTED NOT DETECTED Final   Candida parapsilosis NOT DETECTED NOT DETECTED Final   Candida tropicalis NOT DETECTED NOT DETECTED Final  Blood culture (routine x 2)     Status: Abnormal   Collection Time: 03/13/18  1:34 AM  Result Value Ref Range Status   Specimen Description BLOOD RIGHT HAND  Final   Special Requests   Final    BOTTLES DRAWN AEROBIC AND ANAEROBIC Blood Culture results may not be optimal due to an inadequate volume of blood received in culture bottles   Culture  Setup Time   Final    IN BOTH AEROBIC AND ANAEROBIC BOTTLES GRAM  POSITIVE COCCI CRITICAL RESULT CALLED TO, READ BACK BY AND VERIFIED WITH: J [facility] PHAR[facility] 03/13/18 2239 JDW    Culture STREPTOCOCCUS GORDONII (A)  Final   Report Status 03/15/2018 FINAL  Final  Culture, blood (routine x 2)     Status: None (Preliminary result)   Collection Time: 03/16/18  9:52 AM  Result Value Ref Range Status   Specimen Description BLOOD BLOOD RIGHT HAND  Final   Special Requests AEROBIC BOTTLE ONLY Blood Culture adequate volume  Final   Culture   Final    NO GROWTH 2 DAYS Performed  at [facility], [place] [address] [place], [place] [address]    Report Status PENDING  Incomplete  Culture, blood (routine x 2)     Status: None (Preliminary result)   Collection Time: 03/16/18  9:52 AM  Result Value Ref Range Status   Specimen Description BLOOD BLOOD RIGHT HAND  Final   Special Requests AEROBIC BOTTLE ONLY Blood Culture adequate volume  Final   Culture   Final    NO GROWTH 2 DAYS Performed at [facility], [place] [address], [place], [place] [address]    Report Status PENDING  Incomplete  Culture, Urine     Status: None   Collection Time: 03/16/18  5:15 PM  Result Value Ref Range Status   Specimen Description URINE, CLEAN CATCH  Final   Special Requests NONE  Final   Culture   Final    NO GROWTH Performed at [facility], [place] [address], [place], [place] [address]    Report Status 03/17/2018 FINAL  Final     Time coordinating discharge:  I have spent 35 minutes face to face with the patient and on the ward discussing the patients care, assessment, plan and disposition with other care givers. >50% of the time was devoted counseling the patient about the risks and benefits of treatment/Discharge disposition and coordinating care.   SIGNED:   Damita Lack, MD  [facility] 03/18/2018, 1:29 PM Pager   If 7PM-7AM, please contact night-coverage [email or web address] Password [email or web address]

## 2018-03-18 NOTE — Clinical Social Work Note (Signed)
CSW informed earlier today that patient/wife have concerns regarding paying bills and/or rent, as wife has not worked since patient has been in hospital. [facility] talked with wife and patient regarding needs and resource information provided so that they can go to apply for emergency assistance if they get an eviction or disconnect notice, and this was explained. Wife also expressed concerns regarding the amount they get for food stamps - $16 and CSW explained food stamp allotment based on income. Patient talked with CSW regarding Adult Medicaid and having a deductible. CSW attempted to explain in simple terms regarding a deductible, however not sure if patient understood. CSW encouraged patient to reapply once he is better and can return to social services.  CSW signing off, however please reconsult if any other SW intervention services needed prior to discharge.  Saphronia Ozdemir Givens, MSW, LCSW Licensed Clinical Social Worker [place] [phone]

## 2018-03-18 NOTE — Care Management Important Message (Signed)
Important Message  Patient Details  Name: [name] MRN: [id number] Date of Birth: 01-27-1961   Medicare Important Message Given:  Yes    Ieisha Gao 03/18/2018, 2:30 PM

## 2018-03-18 NOTE — Progress Notes (Signed)
Physical Therapy Treatment Patient Details Name: Russell Price MRN: [id number] DOB: December 30, 1960 Today's Date: 03/18/2018    History of Present Illness Pt is a 57 y/o male with PMH significant forESRD on HD (TTS schedule), HTN, DMII, CAD s/p cath (no stent) in 2017, glaucoma and blindness in the right eye who presented to ED with low back pain and bilateral leg weakness. Patient reports spontaneous onset of bilateral low back pain 2 days ago. MRI revealed disc degeneration most notable at L5-S1 where there is severe L neural foraminal stenosis due to a disc extrusion.     PT Comments    Pt progressing towards physical therapy goals. Was able to perform transfers and ambulation with gross min guard assist, with RW for safety and increased time/effort. Pt and wife reporting concerns about taking the patient home, due to fall yesterday when pt was attempting to ambulate without assist. Wife states "We cannot leave until I am sure he is safe at home." Pt also stating "I do not feel comfortable leaving now." Therapist aware of many social factors as well, and wife mentioned the need for her to work to pay bills, however she feels she cannot leave the pt at home alone at this time. Apparently there are no other family members or friends who could assist.   Although pt continues to be very painful with mobility, he appears to be improving slowly with functional mobility. Pt with no knee buckling during OOB mobility, and was able to complete transfers and limited ambulation with min guard assist. Will continue to follow.   Follow Up Recommendations  Home health PT;Supervision/Assistance - 24 hour     Equipment Recommendations  Rolling walker with 5" wheels;3in1 (PT); Hospital Bed    Recommendations for Other Services       Precautions / Restrictions Precautions Precautions: Fall Restrictions Weight Bearing Restrictions: No    Mobility  Bed Mobility Overal bed mobility: Needs  Assistance Bed Mobility: Rolling;Sidelying to Sit;Sit to Sidelying Rolling: Supervision Sidelying to sit: Min guard     Sit to sidelying: Min guard General bed mobility comments: VC's for proper log roll technique. With [facility] elevated pt was able to get to EOB without assistance. Hands-on guarding to elevate feet back up into bed at end of session.   Transfers Overall transfer level: Needs assistance Equipment used: Rolling walker (2 wheeled) Transfers: Sit to/from Stand Sit to Stand: Min guard         General transfer comment: VC's for hand placement on seated surface for safety. Increased time and effort required to achieve full stand and pt appeared painful.  Ambulation/Gait Ambulation/Gait assistance: Min guard Gait Distance (Feet): 100 Feet Assistive device: Rolling walker (2 wheeled) Gait Pattern/deviations: Step-through pattern;Decreased stride length;Trunk flexed;Antalgic Gait velocity: Decreased Gait velocity interpretation: 1.31 - 2.62 ft/sec, indicative of limited community ambulator General Gait Details: Pt needed cues for proximity to RW as he would push RW out too far at times.  Pt also needing cues to stand upright as he would incr trunk flexion when fatigued.    Stairs             Wheelchair Mobility    Modified Rankin (Stroke Patients Only)       Balance Overall balance assessment: Needs assistance Sitting-balance support: No upper extremity supported;Feet supported Sitting balance-Leahy Scale: Fair     Standing balance support: Bilateral upper extremity supported;During functional activity Standing balance-Leahy Scale: Poor Standing balance comment: relies on heavy UE support for balance.  Cognition Arousal/Alertness: Awake/alert Behavior During Therapy: WFL for tasks assessed/performed Overall Cognitive Status: Within Functional Limits for tasks assessed                                         Exercises      General Comments        Pertinent Vitals/Pain Pain Assessment: Faces Faces Pain Scale: Hurts whole lot Pain Location: Back Pain Descriptors / Indicators: Aching;Grimacing;Guarding Pain Intervention(s): Monitored during session;Limited activity within patient's tolerance    Home Living                      Prior Function            PT Goals (current goals can now be found in the care plan section) Acute Rehab PT Goals Patient Stated Goal: Decrease pain PT Goal Formulation: With patient Time For Goal Achievement: 03/29/18 Potential to Achieve Goals: Good Progress towards PT goals: Progressing toward goals    Frequency    Min 3X/week      PT Plan Current plan remains appropriate    Co-evaluation              AM-PAC PT "6 Clicks" Daily Activity  Outcome Measure  Difficulty turning over in bed (including adjusting bedclothes, sheets and blankets)?: A Little Difficulty moving from lying on back to sitting on the side of the bed? : A Little Difficulty sitting down on and standing up from a chair with arms (e.g., wheelchair, bedside commode, etc,.)?: A Little Help needed moving to and from a bed to chair (including a wheelchair)?: A Little Help needed walking in hospital room?: A Little Help needed climbing 3-5 steps with a railing? : A Lot 6 Click Score: 17    End of Session Equipment Utilized During Treatment: Gait belt Activity Tolerance: Patient limited by fatigue;Patient limited by pain Patient left: with call bell/phone within reach;in bed;with family/visitor present Nurse Communication: Mobility status PT Visit Diagnosis: Unsteadiness on feet (R26.81);Muscle weakness (generalized) (M62.81);Pain Pain - part of body: (back)     Time: 9323-5573 PT Time Calculation (min) (ACUTE ONLY): 25 min  Charges:  $Gait Training: 23-37 mins                     Rolinda Roan, PT, DPT Acute Rehabilitation Services Pager:  [phone] Office: [phone]    Thelma Comp 03/18/2018, 1:00 PM

## 2018-03-19 DIAGNOSIS — M5440 Lumbago with sciatica, unspecified side: Secondary | ICD-10-CM

## 2018-03-19 DIAGNOSIS — B955 Unspecified streptococcus as the cause of diseases classified elsewhere: Secondary | ICD-10-CM

## 2018-03-19 DIAGNOSIS — I2510 Atherosclerotic heart disease of native coronary artery without angina pectoris: Secondary | ICD-10-CM

## 2018-03-19 DIAGNOSIS — R29898 Other symptoms and signs involving the musculoskeletal system: Secondary | ICD-10-CM

## 2018-03-19 LAB — BASIC METABOLIC PANEL
ANION GAP: 11 (ref 5–15)
BUN: 32 mg/dL — AB (ref 6–20)
CALCIUM: 9 mg/dL (ref 8.9–10.3)
CO2: 25 mmol/L (ref 22–32)
Chloride: 92 mmol/L — ABNORMAL LOW (ref 98–111)
Creatinine, Ser: 6.06 mg/dL — ABNORMAL HIGH (ref 0.61–1.24)
GFR calc Af Amer: 11 mL/min — ABNORMAL LOW (ref >60)
GFR calc non Af Amer: 9 mL/min — ABNORMAL LOW (ref >60)
GLUCOSE: 147 mg/dL — AB (ref 70–99)
POTASSIUM: 4 mmol/L (ref 3.5–5.1)
Sodium: 128 mmol/L — ABNORMAL LOW (ref 135–145)

## 2018-03-19 LAB — CBC
HCT: 26.2 % — ABNORMAL LOW (ref 39.0–52.0)
Hemoglobin: 8.3 g/dL — ABNORMAL LOW (ref 13.0–17.0)
MCH: 30 pg (ref 26.0–34.0)
MCHC: 31.7 g/dL (ref 30.0–36.0)
MCV: 94.6 fL (ref 80.0–100.0)
NRBC: 0 % (ref 0.0–0.2)
PLATELETS: 146 10*3/uL — AB (ref 150–400)
RBC: 2.77 MIL/uL — AB (ref 4.22–5.81)
RDW: 14 % (ref 11.5–15.5)
WBC: 7.2 10*3/uL (ref 4.0–10.5)

## 2018-03-19 LAB — GLUCOSE, CAPILLARY
GLUCOSE-CAPILLARY: 132 mg/dL — AB (ref 70–99)
GLUCOSE-CAPILLARY: 116 mg/dL — AB (ref 70–99)
GLUCOSE-CAPILLARY: 124 mg/dL — AB (ref 70–99)
Glucose-Capillary: 166 mg/dL — ABNORMAL HIGH (ref 70–99)
Glucose-Capillary: 181 mg/dL — ABNORMAL HIGH (ref 70–99)

## 2018-03-19 MED ORDER — MAGNESIUM HYDROXIDE 400 MG/5ML PO SUSP: 15.0000 mL | Freq: Every day | ORAL | Status: DC

## 2018-03-19 MED ORDER — SENNOSIDES-DOCUSATE SODIUM 8.6-50 MG PO TABS
2.0000 | ORAL_TABLET | Freq: Two times a day (BID) | ORAL | Status: DC
  Administered 2018-03-19 – 2018-03-21 (×4): 2 via ORAL
  Filled 2018-03-19 (×4): qty 2

## 2018-03-19 MED ORDER — POLYETHYLENE GLYCOL 3350 17 G PO PACK
17.0000 g | PACK | Freq: Two times a day (BID) | ORAL | Status: DC
  Administered 2018-03-19 – 2018-03-21 (×3): 17 g via ORAL
  Filled 2018-03-19 (×4): qty 1

## 2018-03-19 MED ORDER — BISACODYL 10 MG RE SUPP
10.0000 mg | Freq: Once | RECTAL | Status: AC
  Administered 2018-03-19: 10 mg via RECTAL
  Filled 2018-03-19: qty 1

## 2018-03-19 NOTE — Clinical Social Work Note (Signed)
Clinical Social Work Assessment  Patient Details  Name: Russell Price MRN: [id number] Date of Birth: 11-28-1960  Date of referral:  03/19/18               Reason for consult:  Facility Placement, Discharge Planning                Permission sought to share information with:  Family Supports Permission granted to share information::  Yes, Verbal Permission Granted  Name::     Russell Price  Agency::     Relationship::  Wife  Contact Information:  [phone]  Housing/Transportation Living arrangements for the past 2 months:  Apartment Source of Information:  Patient, Spouse Patient Interpreter Needed:  None Criminal Activity/Legal Involvement Pertinent to Current Situation/Hospitalization:  No - Comment as needed Significant Relationships:  Spouse Lives with:  Spouse Do you feel safe going back to the place where you live?  No(Patient and wife agreeable to ST SNF for rehab as wife would be primary caretaker and unable to work) Need for family participation in patient care:  Yes (Comment)  Care giving concerns: Wife expressed concern as she is her husband's only caregiver and is not able to work right now due to him being in the hospital and discharging home (original d/c plan).  Social Worker assessment / plan:  CSW talked with patient and his wife at the bedside regarding discharge disposition. Russell Price was sitting up in bed and was alert, oriented and engaged in the conversation as was his wife. CSW explained that PT had worked with him with morning and was concerned about him discharging home as he only ambulated 25 feet. CSW introduced discharge option of a skilled facility for ST rehab and this was explained. Wife was concerned about patient getting to dialysis and couple advised that facilities transport to dialysis. They were informed about the facility in [place] that does not transport to dialysis or other appointments and CSW was then advised by wife that patient uses [facility]  transportation.  Couple expressed agreement with ST rehab and was provided with the SNF list for [facility] and the facility search process was explained.  Employment status:  Disabled (Comment on whether or not currently receiving Disability) Insurance information:  Medicare PT Recommendations:  Home with [place], Skilled Nursing Facility(Inital recommendation was for [facility], however on 10/31, the recommendation of SNF added) Information / Referral to community resources:  [place]  Patient/Family's Response to care:  Patient and spouse expressed no concerns regarding his care during hospitalization.  Patient/Family's Understanding of and Emotional Response to Diagnosis, Current Treatment, and Prognosis: Patient and wife appeared to understand the need for and benefits of ST rehab. Russell Price asked several times about her husband being transported to dialysis and this was explained to her.  Emotional Assessment Appearance:  Appears older than stated age Attitude/Demeanor/Rapport:  Engaged Affect (typically observed):  Appropriate Orientation:  Oriented to Self, Oriented to Place, Oriented to  Time, Oriented to Situation Alcohol / Substance use:  Tobacco Use, Alcohol Use, Illicit Drugs(Patient reported that he quit smoking and does not drink or use illicit drugs) Psych involvement (Current and /or in the community):  No (Comment)  Discharge Needs  Concerns to be addressed:  Discharge Planning Concerns Readmission within the last 30 days:  No Current discharge risk:  Inadequate Financial Supports(Wife has to work to help them be able to pay their bills) Barriers to Discharge:  No SNF bed(D/C plan changed today, working on  SNF placement)   Sable Feil, LCSW 03/19/2018, 4:19 PM

## 2018-03-19 NOTE — Plan of Care (Signed)

## 2018-03-19 NOTE — Clinical Social Work Placement (Addendum)
   CLINICAL SOCIAL WORK PLACEMENT  NOTE **03/21/18 - Patient discharging to [facility] via ambulance   Date:  03/19/2018  Patient Details  Name: Russell Price MRN: [id number] Date of Birth: [birth year]  Clinical Social Work is seeking post-discharge placement for this patient at the [place] level of care (*CSW will initial, date and re-position this form in  chart as items are completed):  Yes   Patient/family provided with [facility] Work Department's list of facilities offering this level of care within the geographic area requested by the patient (or if unable, by the patient's family).  Yes   Patient/family informed of their freedom to choose among providers that offer the needed level of care, that participate in Medicare, Medicaid or managed care program needed by the patient, have an available bed and are willing to accept the patient.  Yes   Patient/family informed of [facility]'s ownership interest in [facility] and [facility], as well as of the fact that they are under no obligation to receive care at these facilities.  PASRR submitted to EDS on 03/19/18     PASRR number received on 03/19/18([phone] [id number])     Existing PASRR number confirmed on       FL2 transmitted to all facilities in geographic area requested by pt/family on 03/19/18     FL2 transmitted to all facilities within larger geographic area on       Patient informed that his/her managed care company has contracts with or will negotiate with certain facilities, including the following:        Yes   Patient/family informed of bed offers received.  Patient chooses bed at [facility]     Physician recommends and patient chooses bed at      Patient to be transferred to [facility] on 03/21/18  Patient to be transferred to facility by Ambulance     Patient family notified on 03/21/18 of transfer.  Name of family member notified:  Wife, Bing Neighbors at the bedside      PHYSICIAN 03/19/18: [facility] talked with attending physician regarding patient's discharge and he will be discharge on Friday, 11/1. [facility] admissions director contacted and informed. 03/21/18 - Wife at bedside and advised of discharge.       Additional Comment:    _______________________________________________ Sable Feil, LCSW 03/19/2018, 4:44 PM  03/21/18, 11:34 am

## 2018-03-19 NOTE — NC FL2 (Signed)
[place] MEDICAID FL2 LEVEL OF CARE SCREENING TOOL     IDENTIFICATION  Patient Name: Russell Price Birthdate: Oct 17, 1960 Sex: male Admission Date (Current Location): 03/12/2018  [facility] and [place] Number:  Herbalist and Address:  [facility] [place], [place] [address] [place], [place] [address]      Provider Number: [id number]  Attending Physician Name and Address:  Kayleen Memos, DO  Relative Name and Phone Number:  Bing Neighbors - wife; [phone]    Current Level of Care: Hospital Recommended Level of Care: [place] Prior Approval Number:    Date Approved/Denied:   PASRR Number: [id number](Eff. 03/19/18)  Discharge Plan: SNF    Current Diagnoses: Patient Active Problem List   Diagnosis Date Noted  . Streptococcal bacteremia 03/14/2018  . Fever 03/13/2018  . Acute low back pain 03/13/2018  . Bilateral leg weakness 03/13/2018  . Sepsis ([place]) 02/06/2017  . Ascites   . Hyponatremia 11/06/2016  . Fluid overload 11/06/2016  . CAD (coronary artery disease)   . Abnormal nuclear stress test 03/04/2016  . ESRD (end stage renal disease) ([place])   . Diabetic neuropathy ([place]) 10/05/2015  . Left arm weakness   . Acute encephalopathy   . Hyperlipidemia 09/21/2015  . Labile blood pressure   . Labile blood glucose   . Diabetes mellitus type 2 in nonobese (HCC)   . Anemia of chronic disease   . Debility 09/08/2015  . Acidemia   . Seizure-like activity ([place])   . Acute kidney injury ([place])   . Cerebral thrombosis with cerebral infarction ([place]) 02/18/2015  . Essential hypertension 11/11/2014  . DM (diabetes mellitus) with complications ([place]) 32/44/0102  . Blind right eye 11/11/2014    Orientation RESPIRATION BLADDER Height & Weight     Self, Time, Situation, Place  Normal Continent Weight: 160 lb 0.9 oz (72.6 kg) Height:     BEHAVIORAL SYMPTOMS/MOOD NEUROLOGICAL BOWEL NUTRITION STATUS      Continent Diet  AMBULATORY STATUS  COMMUNICATION OF NEEDS Skin   Supervision([place]) Verbally Normal                       Personal Care Assistance Level of Assistance  Bathing, Feeding, Dressing Bathing Assistance: Limited assistance Feeding assistance: Independent Dressing Assistance: Limited assistance     Functional Limitations Info  Sight, Hearing, Speech Sight Info: Impaired(Blind right eye, imparied vision left eye) Hearing Info: Adequate Speech Info: Adequate    SPECIAL CARE FACTORS FREQUENCY  PT (By licensed PT), OT (By licensed OT)     PT Frequency: PT at SNF, Eval and Treat.  Evaluated at [facility] 10/27. OT Frequency: OT at SNF, Eval and Treat            Contractures Contractures Info: Not present    Additional Factors Info  Code Status, Allergies, Insulin Sliding Scale Code Status Info: Full Allergies Info: No known allergies   Insulin Sliding Scale Info: 0-5 Units daily at bedtime; 0-9 Units 3 times daily with meals       Current Medications (03/19/2018):  This is the current hospital active medication list Current Facility-Administered Medications  Medication Dose Route Frequency Provider Last Rate Last Dose  . 0.9 %  sodium chloride infusion   Intravenous PRN Mercy Riding, MD 10 mL/hr at 03/13/18 1738    . acetaminophen (TYLENOL) tablet 1,000 mg  1,000 mg Oral Q8H PRN Eulogio Bear U, DO   1,000 mg at 03/19/18 0914  .  amLODipine (NORVASC) tablet 2.5 mg  2.5 mg Oral Daily Wendee Beavers T, MD   2.5 mg at 03/19/18 1018  . atorvastatin (LIPITOR) tablet 40 mg  40 mg Oral q1800 Wendee Beavers T, MD   40 mg at 03/18/18 1736  . bisacodyl (DULCOLAX) suppository 10 mg  10 mg Rectal Daily PRN Eulogio Bear U, DO      . bisacodyl (DULCOLAX) suppository 10 mg  10 mg Rectal Once Irene Pap N, DO      . carvedilol (COREG) tablet 6.25 mg  6.25 mg Oral BID WC Wendee Beavers T, MD   6.25 mg at 03/19/18 0915  . ceFAZolin (ANCEF) IVPB 2g/100 mL premix  2 g Intravenous Q T,Th,Sat-1800 Susa Raring,  [place]      . Chlorhexidine Gluconate Cloth 2 % PADS 6 each  6 each Topical Q0600 Penninger, Gene Autry, PA   6 each at 03/19/18 0500  . cyclobenzaprine (FLEXERIL) tablet 10 mg  10 mg Oral TID PRN Mercy Riding, MD   10 mg at 03/17/18 1712  . Darbepoetin Alfa (ARANESP) injection 40 mcg  40 mcg Intravenous Q Tue-HD Penninger, Lindsay, PA   40 mcg at 03/17/18 1144  . doxercalciferol (HECTOROL) injection 3 mcg  3 mcg Intravenous Q T,Th,Sa-HD Penninger, Lindsay, PA   3 mcg at 03/17/18 1143  . feeding supplement (NEPRO CARB STEADY) liquid 237 mL  237 mL Oral BID BM Gonfa, Taye T, MD   237 mL at 03/18/18 1300  . furosemide (LASIX) tablet 80 mg  80 mg Oral BID Wendee Beavers T, MD   80 mg at 03/19/18 0915  . gabapentin (NEURONTIN) capsule 300 mg  300 mg Oral Daily Gonfa, Taye T, MD   300 mg at 03/19/18 1017  . heparin injection 5,000 Units  5,000 Units Subcutaneous Q8H Wendee Beavers T, MD   5,000 Units at 03/19/18 0500  . HYDROmorphone (DILAUDID) injection 0.5 mg  0.5 mg Intravenous Q4H PRN Eulogio Bear U, DO   0.5 mg at 03/15/18 4332  . insulin aspart (novoLOG) injection 0-5 Units  0-5 Units Subcutaneous QHS Mercy Riding, MD   Stopped at 03/18/18 2257  . insulin aspart (novoLOG) injection 0-9 Units  0-9 Units Subcutaneous TID WC Mercy Riding, MD   1 Units at 03/18/18 1735  . insulin glargine (LANTUS) injection 10 Units  10 Units Subcutaneous BID Mercy Riding, MD   10 Units at 03/19/18 1018  . lactulose (CHRONULAC) 10 GM/15ML solution 20 g  20 g Oral TID Amin, Ankit Chirag, MD   20 g at 03/19/18 1016  . multivitamin (RENA-VIT) tablet 1 tablet  1 tablet Oral QHS Mercy Riding, MD   1 tablet at 03/18/18 2218  . oxyCODONE (Oxy IR/ROXICODONE) immediate release tablet 5-10 mg  5-10 mg Oral Q6H PRN Eulogio Bear U, DO   5 mg at 03/19/18 1016  . polyethylene glycol (MIRALAX / GLYCOLAX) packet 17 g  17 g Oral BID Hall, Carole N, DO      . senna-docusate (Senokot-S) tablet 2 tablet  2 tablet Oral BID Irene Pap N, DO       . sevelamer carbonate (RENVELA) tablet 800 mg  800 mg Oral TID WC Wendee Beavers T, MD   800 mg at 03/19/18 0915  . sorbitol 70 % solution 30 mL  30 mL Oral Daily PRN Eulogio Bear U, DO   30 mL at 03/17/18 0257  . timolol (TIMOPTIC) 0.5 % ophthalmic solution 1 drop  1  drop Left Eye BID Mercy Riding, MD   1 drop at 03/19/18 1018     Discharge Medications: Please see discharge summary for a list of discharge medications.  Relevant Imaging Results:  Relevant Lab Results:   Additional Information [phone]. Dialysis Patient TTS (12:20 pm chair time) at [place].  Sable Feil, LCSW

## 2018-03-19 NOTE — Progress Notes (Signed)
[facility] Progress Note   Subjective: Patient says he is not leaving hospital while his back is hurting so bad. Asking for pain meds. HD today here.     Objective Vitals:   03/18/18 1633 03/18/18 2152 03/19/18 0444 03/19/18 0741  BP: (!) 152/62 (!) 153/76 (!) 143/56 (!) 171/76  Pulse: 67 61 73 76  Resp: 18 18 18 18   Temp: 99.7 F (37.6 C) 98.3 F (36.8 C) 98.6 F (37 C) (!) 100.7 F (38.2 C)  TempSrc: Oral   Oral  SpO2: 97% 96% 96% 99%  Weight:  72.6 kg     Physical Exam General: Chronically ill appearing male in NAD Heart: S1,S2 RRR Lungs: CTAB Abdomen: soft, NT Extremities: No LE Edema Dialysis Access: LUA AVG + bruit.    Additional Objective Labs: Basic Metabolic Panel: Recent Labs  Lab 03/14/18 1440  03/17/18 0711 03/18/18 0657 03/19/18 0453  NA 131*   < > 130* 133* 128*  K 3.7   < > 3.9 3.8 4.0  CL 95*   < > 93* 95* 92*  CO2 25   < > 24 26 25   GLUCOSE 257*   < > 165* 111* 147*  BUN 59*   < > 52* 25* 32*  CREATININE 7.40*   < > 7.36* 4.70* 6.06*  CALCIUM 8.2*   < > 8.7* 9.1 9.0  PHOS 4.3  --  5.4*  --   --    < > = values in this interval not displayed.   Liver Function Tests: Recent Labs  Lab 03/14/18 1440 03/17/18 0711  ALBUMIN 2.3* 2.4*   No results for input(s): LIPASE, AMYLASE in the last 168 hours. CBC: Recent Labs  Lab 03/14/18 1440 03/16/18 0533 03/17/18 0711 03/18/18 0657 03/19/18 0453  WBC 11.3* 10.1 6.7 7.0 7.2  HGB 9.1* 9.8* 8.8* 9.3* 8.3*  HCT 27.5* 30.6* 27.2* 30.2* 26.2*  MCV 92.9 94.7 94.1 95.9 94.6  PLT 119* 140* 151 170 146*   Blood Culture    Component Value Date/Time   SDES URINE, CLEAN CATCH 03/16/2018 1715   SPECREQUEST NONE 03/16/2018 1715   CULT  03/16/2018 1715    NO GROWTH Performed at [facility], [place] [address], [place], [place] [address]    REPTSTATUS 03/17/2018 FINAL 03/16/2018 1715    Cardiac Enzymes: No results for input(s): CKTOTAL, CKMB, CKMBINDEX, TROPONINI in the last  168 hours. CBG: Recent Labs  Lab 03/17/18 2014 03/18/18 0732 03/18/18 1204 03/18/18 1634 03/19/18 0743  GLUCAP 160* 98 136* 126* 116*   Iron Studies: No results for input(s): IRON, TIBC, TRANSFERRIN, FERRITIN in the last 72 hours. @lablastinr3 @ Studies/Results: No results found. Medications: . sodium chloride 10 mL/hr at 03/13/18 1738  .  ceFAZolin (ANCEF) IV     . amLODipine  2.5 mg Oral Daily  . atorvastatin  40 mg Oral q1800  . bisacodyl  10 mg Rectal Once  . carvedilol  6.25 mg Oral BID WC  . Chlorhexidine Gluconate Cloth  6 each Topical Q0600  . darbepoetin (ARANESP) injection - DIALYSIS  40 mcg Intravenous Q Tue-HD  . doxercalciferol  3 mcg Intravenous Q T,Th,Sa-HD  . feeding supplement (NEPRO CARB STEADY)  237 mL Oral BID BM  . furosemide  80 mg Oral BID  . gabapentin  300 mg Oral Daily  . heparin  5,000 Units Subcutaneous Q8H  . insulin aspart  0-5 Units Subcutaneous QHS  . insulin aspart  0-9 Units Subcutaneous TID WC  .  insulin glargine  10 Units Subcutaneous BID  . lactulose  20 g Oral TID  . magnesium hydroxide  15 mL Oral Daily  . multivitamin  1 tablet Oral QHS  . polyethylene glycol  17 g Oral BID  . senna-docusate  2 tablet Oral BID  . sevelamer carbonate  800 mg Oral TID WC  . timolol  1 drop Left Eye BID     Dialysis Orders: TTS -NW 4.25hrs, BFR400, KKO469, EDW 71kg,2K/2.25Ca  Access:LU AVG Heparin5000 unit bolus, 1500 mid run Hectorol58mcg IV qHD  Assessment/Plan: 1. Strepgordonaebacteremia - source unclear-presumed discitis. Repeat BC, UA, Urine cultures.Tmax 99.6in last 24hrs, BC+strep gordonii.On IV Ancef-continue Ancef 2 grams for 6 weeks through 04/25/18 .ID followingTTE show no endocarditis. Primary/ID.MRI negative for discitis.  Lower back pain - MRI w/o showed stenosis w/disc extrusion @L5 /S1 per radiology no indication of infection. Neurosurgery did not think MRI findings severe enough for surgery. 2. ESRD - On  HD TTS schedule.Was supposed to be [facility] but still in hospital. HD today on schedule.  3. Hypertension/volume - BP elevated probably related to pain issues. .On norvasc and coreg. Above OP EDW last HD. Push to OP EDW today. Na low 128-needs volume removal.  4. Anemia of CKD - Hgb 9.3. Follow trends.Aranesp 40 mcg IV with HD 03/17/18 5. Secondary Hyperparathyroidism -Ca 9.0 Last Phos 5.4Continue Hectorol and renvela 6. Nutrition - Albumin 2.4.Renal diet w/fluid restriction.Renavite  [name] NP-C 03/19/2018, 11:17 AM  [employer] [phone]

## 2018-03-19 NOTE — Progress Notes (Signed)
PROGRESS NOTE  Russell Price EXB:[id number] DOB: 1960/06/06 DOA: 03/12/2018 PCP: Charlott Rakes, MD  HPI/Recap of past 73 hours: 57 year old with history of ESRD TTS, essential hypertension, diabetes mellitus type 2, CAD, glaucoma, right eye blindness came to the hospital with complains of lower back pain and bilateral lower extremity weakness.  His pain onset was very acute.  MRI showed stenosis with disc extrusion of L5-S1 with possible concerns of discitis?Marland Kitchen  MRI findings were not severe enough for surgical intervention per neurosurgery.  Patient's blood culture ended up growing streptococcal bacteremia. TEE was negative for endocarditis.  Infectious disease recommended treating with IV ceftezole and for about 6 weeks with Cefazolin during his dialysis until December 8.  Infectious disease will make outpatient follow-up with their service.  03/19/18: seen and examined w his wife at bedside. Reports significant weakness, poor appetite and significant lower back pain. PT assessed and recommended SNF to continue physical rehab with 24 hour supervision.  Assessment/Plan: Principal Problem:   Streptococcal bacteremia Active Problems:   Essential hypertension   DM (diabetes mellitus) with complications (HCC)   Blind right eye   Debility   ESRD (end stage renal disease) ([place])   CAD (coronary artery disease)   Fever   Acute low back pain   Bilateral leg weakness  Streptococcus bacteremia complicated by possible discitis Persistent lower back pain Continue IV antibiotics s recommended by ID IV ancef to end on 04/26/18 Repeat blood cx x 2 drawn on 03/16/18 negative to date Monitor fever curve and wbc Abnormal MRI lumbar spine; showed stenosis with disc extrusion on L5-S1 but no obvious discitis.  Neurosurgery did not feel like patient required emergent/acute surgical intervention and recommended pain control. Continue pain management and add bowel regimen  Chronic constipation Self  reported no bowel movement in more than 1 week Start bowel regimen w senokot 2 tabs bid, miralax daily, milk of magnesia and dulcolax  Generalized weakness and lower back pain Worsening generalized weakness PT recommends SNF and 24 hour supervision Fall precaution Pain management in place IV dilaudid prn for severe pain  ESRD on HD TTS Planned for HD today Monitor VS post dialysis  Anemia of chronic disease Hg stable Managed by nephrology  HTN BP stable C/w coreg, norvasc  HLD liptor  Type 2 DM complicated by hyperglycemia A1C 7.5 on 03/13/18 ISS   Code Status: full   Family Communication: wife at bedside. All questions answered to her satisfaction  Disposition Plan: SNF possibly tomorrow when bed available   Consultants:  nephrology   Procedures:  HD  TEE  Antimicrobials:  IV ancef  DVT prophylaxis:  sq hep tid   Objective: Vitals:   03/18/18 1633 03/18/18 2152 03/19/18 0444 03/19/18 0741  BP: (!) 152/62 (!) 153/76 (!) 143/56 (!) 171/76  Pulse: 67 61 73 76  Resp: 18 18 18 18   Temp: 99.7 F (37.6 C) 98.3 F (36.8 C) 98.6 F (37 C) (!) 100.7 F (38.2 C)  TempSrc: Oral   Oral  SpO2: 97% 96% 96% 99%  Weight:  72.6 kg      Intake/Output Summary (Last 24 hours) at 03/19/2018 1358 Last data filed at 03/19/2018 0900 Gross per 24 hour  Intake 3107.34 ml  Output 450 ml  Net 2657.34 ml   Filed Weights   03/17/18 1245 03/17/18 2014 03/18/18 2152  Weight: 72.6 kg 72.6 kg 72.6 kg    Exam:  . General: 57 y.o. year-old male frail. Appears weak.  Alert and oriented x3. Marland Kitchen  Cardiovascular: Regular rate and rhythm with no rubs or gallops.  No thyromegaly or JVD noted.   Marland Kitchen Respiratory: Clear to auscultation with no wheezes or rales. Good inspiratory effort. . Abdomen: Soft nontender nondistended with normal bowel sounds x4 quadrants. . Musculoskeletal: LUE swelling. 2/4 pulses in all 4 extremities. . Skin: No ulcerative lesions noted or  rashes . Psychiatry: Mood is appropriate for condition and setting   Data Reviewed: CBC: Recent Labs  Lab 03/14/18 1440 03/16/18 0533 03/17/18 0711 03/18/18 0657 03/19/18 0453  WBC 11.3* 10.1 6.7 7.0 7.2  HGB 9.1* 9.8* 8.8* 9.3* 8.3*  HCT 27.5* 30.6* 27.2* 30.2* 26.2*  MCV 92.9 94.7 94.1 95.9 94.6  PLT 119* 140* 151 170 035*   Basic Metabolic Panel: Recent Labs  Lab 03/14/18 1440 03/16/18 0533 03/17/18 0711 03/18/18 0657 03/19/18 0453  NA 131* 132* 130* 133* 128*  K 3.7 3.8 3.9 3.8 4.0  CL 95* 95* 93* 95* 92*  CO2 25 24 24 26 25   GLUCOSE 257* 172* 165* 111* 147*  BUN 59* 38* 52* 25* 32*  CREATININE 7.40* 6.02* 7.36* 4.70* 6.06*  CALCIUM 8.2* 8.9 8.7* 9.1 9.0  PHOS 4.3  --  5.4*  --   --    GFR: Estimated Creatinine Clearance: 13.5 mL/min (A) (by C-G formula based on SCr of 6.06 mg/dL (H)). Liver Function Tests: Recent Labs  Lab 03/14/18 1440 03/17/18 0711  ALBUMIN 2.3* 2.4*   No results for input(s): LIPASE, AMYLASE in the last 168 hours. No results for input(s): AMMONIA in the last 168 hours. Coagulation Profile: No results for input(s): INR, PROTIME in the last 168 hours. Cardiac Enzymes: No results for input(s): CKTOTAL, CKMB, CKMBINDEX, TROPONINI in the last 168 hours. BNP (last 3 results) No results for input(s): PROBNP in the last 8760 hours. HbA1C: No results for input(s): HGBA1C in the last 72 hours. CBG: Recent Labs  Lab 03/18/18 0732 03/18/18 1204 03/18/18 1634 03/19/18 0743 03/19/18 1145  GLUCAP 98 136* 126* 116* 166*   Lipid Profile: No results for input(s): CHOL, HDL, LDLCALC, TRIG, CHOLHDL, LDLDIRECT in the last 72 hours. Thyroid Function Tests: No results for input(s): TSH, T4TOTAL, FREET4, T3FREE, THYROIDAB in the last 72 hours. Anemia Panel: No results for input(s): VITAMINB12, FOLATE, FERRITIN, TIBC, IRON, RETICCTPCT in the last 72 hours. Urine analysis:    Component Value Date/Time   COLORURINE YELLOW 03/16/2018 1752    APPEARANCEUR HAZY (A) 03/16/2018 1752   LABSPEC 1.012 03/16/2018 1752   PHURINE 6.0 03/16/2018 1752   GLUCOSEU NEGATIVE 03/16/2018 1752   HGBUR SMALL (A) 03/16/2018 1752   BILIRUBINUR NEGATIVE 03/16/2018 1752   BILIRUBINUR negative 02/27/2015 1458   BILIRUBINUR neg 02/22/2013 1737   KETONESUR NEGATIVE 03/16/2018 1752   PROTEINUR 100 (A) 03/16/2018 1752   UROBILINOGEN 0.2 02/28/2015 0032   NITRITE NEGATIVE 03/16/2018 1752   LEUKOCYTESUR TRACE (A) 03/16/2018 1752   Sepsis Labs: @LABRCNTIP (procalcitonin:4,lacticidven:4)  ) Recent Results (from the past 240 hour(s))  Urine Culture     Status: Abnormal   Collection Time: 03/13/18 12:54 AM  Result Value Ref Range Status   Specimen Description URINE, RANDOM  Final   Special Requests NONE  Final   Culture (A)  Final    <10,000 COLONIES/mL INSIGNIFICANT GROWTH Performed at [facility], [place] [address] [place],  [address]    Report Status 03/14/2018 FINAL  Final  Blood culture (routine x 2)     Status: Abnormal   Collection Time: 03/13/18  1:24 AM  Result Value Ref Range Status   Specimen Description BLOOD RIGHT FOREARM  Final   Special Requests   Final    BOTTLES DRAWN AEROBIC AND ANAEROBIC Blood Culture results may not be optimal due to an inadequate volume of blood received in culture bottles   Culture  Setup Time   Final    GRAM POSITIVE COCCI IN BOTH AEROBIC AND ANAEROBIC BOTTLES CRITICAL RESULT CALLED TO, READ BACK BY AND VERIFIED WITHKarsten [facility] 03/13/18 2239 JDW Performed at [facility], [place] [address] [place], [place] [address]    Culture STREPTOCOCCUS GORDONII (A)  Final   Report Status 03/15/2018 FINAL  Final   Organism ID, Bacteria STREPTOCOCCUS GORDONII  Final      Susceptibility   Streptococcus gordonii - MIC*    PENICILLIN 0.25 INTERMEDIATE Intermediate     CEFTRIAXONE 0.25 SENSITIVE Sensitive     ERYTHROMYCIN 2 RESISTANT Resistant     LEVOFLOXACIN 1 SENSITIVE Sensitive      VANCOMYCIN 0.5 SENSITIVE Sensitive     * STREPTOCOCCUS GORDONII  Blood Culture ID Panel (Reflexed)     Status: Abnormal   Collection Time: 03/13/18  1:24 AM  Result Value Ref Range Status   Enterococcus species NOT DETECTED NOT DETECTED Final   Listeria monocytogenes NOT DETECTED NOT DETECTED Final   Staphylococcus species NOT DETECTED NOT DETECTED Final   Staphylococcus aureus (BCID) NOT DETECTED NOT DETECTED Final   Streptococcus species DETECTED (A) NOT DETECTED Final    Comment: Not Enterococcus species, Streptococcus agalactiae, Streptococcus pyogenes, or Streptococcus pneumoniae. CRITICAL RESULT CALLED TO, READ BACK BY AND VERIFIED WITH: J [facility] 03/13/18 2239 JDW    Streptococcus agalactiae NOT DETECTED NOT DETECTED Final   Streptococcus pneumoniae NOT DETECTED NOT DETECTED Final   Streptococcus pyogenes NOT DETECTED NOT DETECTED Final   Acinetobacter baumannii NOT DETECTED NOT DETECTED Final   Enterobacteriaceae species NOT DETECTED NOT DETECTED Final   Enterobacter cloacae complex NOT DETECTED NOT DETECTED Final   Escherichia coli NOT DETECTED NOT DETECTED Final   Klebsiella oxytoca NOT DETECTED NOT DETECTED Final   Klebsiella pneumoniae NOT DETECTED NOT DETECTED Final   Proteus species NOT DETECTED NOT DETECTED Final   Serratia marcescens NOT DETECTED NOT DETECTED Final   Haemophilus influenzae NOT DETECTED NOT DETECTED Final   Neisseria meningitidis NOT DETECTED NOT DETECTED Final   Pseudomonas aeruginosa NOT DETECTED NOT DETECTED Final   Candida albicans NOT DETECTED NOT DETECTED Final   Candida glabrata NOT DETECTED NOT DETECTED Final   Candida krusei NOT DETECTED NOT DETECTED Final   Candida parapsilosis NOT DETECTED NOT DETECTED Final   Candida tropicalis NOT DETECTED NOT DETECTED Final  Blood culture (routine x 2)     Status: Abnormal   Collection Time: 03/13/18  1:34 AM  Result Value Ref Range Status   Specimen Description BLOOD RIGHT HAND  Final    Special Requests   Final    BOTTLES DRAWN AEROBIC AND ANAEROBIC Blood Culture results may not be optimal due to an inadequate volume of blood received in culture bottles   Culture  Setup Time   Final    IN BOTH AEROBIC AND ANAEROBIC BOTTLES GRAM POSITIVE COCCI CRITICAL RESULT CALLED TO, READ BACK BY AND VERIFIED WITH: J [facility] 03/13/18 2239 JDW    Culture STREPTOCOCCUS GORDONII (A)  Final   Report Status 03/15/2018 FINAL  Final  Culture, blood (routine x 2)     Status: None (Preliminary result)   Collection Time: 03/16/18  9:52 AM  Result Value Ref Range Status   Specimen Description BLOOD BLOOD RIGHT HAND  Final   Special Requests AEROBIC BOTTLE ONLY Blood Culture adequate volume  Final   Culture   Final    NO GROWTH 2 DAYS Performed at [facility], [address] [place], [place] [address]    Report Status PENDING  Incomplete  Culture, blood (routine x 2)     Status: None (Preliminary result)   Collection Time: 03/16/18  9:52 AM  Result Value Ref Range Status   Specimen Description BLOOD BLOOD RIGHT HAND  Final   Special Requests AEROBIC BOTTLE ONLY Blood Culture adequate volume  Final   Culture   Final    NO GROWTH 2 DAYS Performed at [facility], [place] [address] [place], [place], [place] [address]    Report Status PENDING  Incomplete  Culture, Urine     Status: None   Collection Time: 03/16/18  5:15 PM  Result Value Ref Range Status   Specimen Description URINE, CLEAN CATCH  Final   Special Requests NONE  Final   Culture   Final    NO GROWTH Performed at [facility], [place] [address] [place], [place] [address]    Report Status 03/17/2018 FINAL  Final      Studies: No results found.  Scheduled Meds: . amLODipine  2.5 mg Oral Daily  . atorvastatin  40 mg Oral q1800  . carvedilol  6.25 mg Oral BID WC  . Chlorhexidine Gluconate Cloth  6 each Topical Q0600  . darbepoetin (ARANESP) injection - DIALYSIS  40 mcg Intravenous Q Tue-HD  . doxercalciferol   3 mcg Intravenous Q T,Th,Sa-HD  . feeding supplement (NEPRO CARB STEADY)  237 mL Oral BID BM  . furosemide  80 mg Oral BID  . gabapentin  300 mg Oral Daily  . heparin  5,000 Units Subcutaneous Q8H  . insulin aspart  0-5 Units Subcutaneous QHS  . insulin aspart  0-9 Units Subcutaneous TID WC  . insulin glargine  10 Units Subcutaneous BID  . lactulose  20 g Oral TID  . multivitamin  1 tablet Oral QHS  . polyethylene glycol  17 g Oral BID  . senna-docusate  2 tablet Oral BID  . sevelamer carbonate  800 mg Oral TID WC  . timolol  1 drop Left Eye BID    Continuous Infusions: . sodium chloride 10 mL/hr at 03/13/18 1738  .  ceFAZolin (ANCEF) IV       LOS: 4 days     Kayleen Memos, MD [facility] Pager [phone]  If 7PM-7AM, please contact night-coverage [email or web address] Password [email or web address] 03/19/2018, 1:58 PM

## 2018-03-19 NOTE — Progress Notes (Addendum)
Physical Therapy Treatment Patient Details Name: Russell Price MRN: [id number] DOB: 01/09/1961 Today's Date: 03/19/2018    History of Present Illness Pt is a 57 y/o male with PMH significant forESRD on HD (TTS schedule), HTN, DMII, CAD s/p cath (no stent) in 2017, glaucoma and blindness in the right eye who presented to ED with low back pain and bilateral leg weakness. Patient reports spontaneous onset of bilateral low back pain 2 days ago. MRI revealed disc degeneration most notable at L5-S1 where there is severe L neural foraminal stenosis due to a disc extrusion.     PT Comments    Patient seen for mobility progression. Patient today requiring up to Mod A for sit to stand transfer at bedside as well as limited gait with RW. Patient with crouched posturing throughout even with max cueing for upright and full hip extension. Patient reporting pain limiting with required need to sit/return to supine due to pain levels. Discussion with patient and wife regarding importance of OOB mobility to continue to progress strength and general function. Wife reports hospital bed being delivered at home on this date?  *Updated Recommendation due to reduced functional mobility today and required physical assist needed to prevent fall/ambulate. Social worker planning to speak with family regarding PT recommendations as patients wife continues to report inability to provide 24 hour care.     Follow Up Recommendations  Home health PT;Supervision/Assistance - 24 hour;SNF(may ned SNF: limited mobility and need for physical assist)     Equipment Recommendations  Rolling walker with 5" wheels;3in1 (PT)    Recommendations for Other Services       Precautions / Restrictions Precautions Precautions: Fall Restrictions Weight Bearing Restrictions: No    Mobility  Bed Mobility Overal bed mobility: Needs Assistance Bed Mobility: Rolling;Sidelying to Sit;Sit to Sidelying Rolling: Supervision Sidelying to  sit: Min assist;Min guard     Sit to sidelying: Min assist General bed mobility comments: Patient with increased time and effort for transfers; grimacing throughout due to LBP; Min A to return to bed for LE management  Transfers Overall transfer level: Needs assistance Equipment used: Rolling walker (2 wheeled) Transfers: Sit to/from Stand Sit to Stand: Mod assist         General transfer comment: Mod A to power up from bedside; cueing for hand placement and sequencing; poor ability to maintain upright posture  Ambulation/Gait Ambulation/Gait assistance: Mod assist Gait Distance (Feet): 25 Feet Assistive device: Rolling walker (2 wheeled) Gait Pattern/deviations: Step-to pattern;Decreased stride length;Antalgic;Trunk flexed Gait velocity: Decreased   General Gait Details: patient with crouched posturing with gait even with max cueing for upright; patient grimacing and moaning throughout mobility   Stairs             Wheelchair Mobility    Modified Rankin (Stroke Patients Only)       Balance Overall balance assessment: Needs assistance Sitting-balance support: No upper extremity supported;Feet supported Sitting balance-Leahy Scale: Fair     Standing balance support: Bilateral upper extremity supported;During functional activity Standing balance-Leahy Scale: Poor Standing balance comment: relies on heavy UE support for balance.                             Cognition Arousal/Alertness: Awake/alert Behavior During Therapy: WFL for tasks assessed/performed Overall Cognitive Status: Within Functional Limits for tasks assessed  Exercises      General Comments        Pertinent Vitals/Pain Pain Assessment: 0-10 Pain Score: 7  Pain Location: back Pain Descriptors / Indicators: Aching;Guarding;Grimacing Pain Intervention(s): Limited activity within patient's tolerance;Monitored during session     Home Living                      Prior Function            PT Goals (current goals can now be found in the care plan section) Acute Rehab PT Goals Patient Stated Goal: Decrease pain PT Goal Formulation: With patient Time For Goal Achievement: 03/29/18 Potential to Achieve Goals: Good Progress towards PT goals: Progressing toward goals    Frequency    Min 3X/week      PT Plan Current plan remains appropriate    Co-evaluation              AM-PAC PT "6 Clicks" Daily Activity  Outcome Measure  Difficulty turning over in bed (including adjusting bedclothes, sheets and blankets)?: A Little Difficulty moving from lying on back to sitting on the side of the bed? : A Little Difficulty sitting down on and standing up from a chair with arms (e.g., wheelchair, bedside commode, etc,.)?: Unable Help needed moving to and from a bed to chair (including a wheelchair)?: A Little Help needed walking in hospital room?: A Little Help needed climbing 3-5 steps with a railing? : A Lot 6 Click Score: 15    End of Session Equipment Utilized During Treatment: Gait belt Activity Tolerance: Patient limited by fatigue;Patient limited by pain Patient left: with call bell/phone within reach;in bed;with family/visitor present Nurse Communication: Mobility status PT Visit Diagnosis: Unsteadiness on feet (R26.81);Muscle weakness (generalized) (M62.81);Pain Pain - part of body: (back)     Time: 0981-1914 PT Time Calculation (min) (ACUTE ONLY): 24 min  Charges:  $Gait Training: 8-22 mins $Therapeutic Activity: 8-22 mins                     Lanney Gins, PT, DPT Supplemental Physical Therapist 03/19/18 11:15 AM Pager: [phone] Office: [phone]

## 2018-03-19 NOTE — Evaluation (Signed)
Occupational Therapy Evaluation Patient Details Name: Russell Price MRN: [id number] DOB: 27-Nov-1960 Today's Date: 03/19/2018    History of Present Illness Pt is a 57 y/o male with PMH significant forESRD on HD (TTS schedule), HTN, DMII, CAD s/p cath (no stent) in 2017, glaucoma and blindness in the right eye who presented to ED with low back pain and bilateral leg weakness. Patient reports spontaneous onset of bilateral low back pain 2 days ago. MRI revealed disc degeneration most notable at L5-S1 where there is severe L neural foraminal stenosis due to a disc extrusion.    Clinical Impression   Pt admitted with back pain. Pt currently with functional limitations due to the deficits listed below (see OT Problem List).  Pt will benefit from skilled OT to increase their safety and independence with ADL and functional mobility for ADL to facilitate discharge to venue listed below.      Follow Up Recommendations  SNF    Equipment Recommendations  None recommended by OT    Recommendations for Other Services       Precautions / Restrictions Precautions Precautions: Fall Restrictions Weight Bearing Restrictions: No      Mobility Bed Mobility Overal bed mobility: Needs Assistance Bed Mobility: Rolling;Sidelying to Sit Rolling: Supervision Sidelying to sit: Min assist;Min guard     Sit to sidelying: Min assist General bed mobility comments: Patient with increased time and effort  Transfers Overall transfer level: Needs assistance Equipment used: Rolling walker (2 wheeled) Transfers: Sit to/from [facility] Sit to Stand: Mod assist Stand pivot transfers: Mod assist       General transfer comment: Mod A to power up from bedside; cueing for hand placement and sequencing; poor ability to maintain upright posture    Balance Overall balance assessment: Needs assistance Sitting-balance support: No upper extremity supported;Feet supported Sitting balance-Leahy  Scale: Fair     Standing balance support: Bilateral upper extremity supported;During functional activity Standing balance-Leahy Scale: Poor Standing balance comment: relies on heavy UE support for balance.                            ADL either performed or assessed with clinical judgement   ADL Overall ADL's : Needs assistance/impaired Eating/Feeding: Set up;Sitting   Grooming: Set up;Sitting   Upper Body Bathing: Minimal assistance;Sitting   Lower Body Bathing: Maximal assistance;Sit to/from stand;Cueing for sequencing   Upper Body Dressing : Minimal assistance;Sitting   Lower Body Dressing: Maximal assistance;Sit to/from stand;Cueing for sequencing;Cueing for safety   Toilet Transfer: Maximal assistance;Stand-pivot;Cueing for sequencing;Cueing for safety;RW Toilet Transfer Details (indicate cue type and reason): bed to chair           General ADL Comments: Pt not abe to go home at this time.  Pt needs post acute rehab to increase I with ADL activity and return to PLOF     Vision Patient Visual Report: No change from baseline              Pertinent Vitals/Pain Pain Assessment: 0-10 Pain Score: 6  Pain Location: back Pain Descriptors / Indicators: Aching;Discomfort Pain Intervention(s): Limited activity within patient's tolerance;Repositioned           Communication     Cognition Arousal/Alertness: Awake/alert Behavior During Therapy: WFL for tasks assessed/performed Overall Cognitive Status: Within Functional Limits for tasks assessed  Home Living Family/patient expects to be discharged to:: Skilled nursing facility                                                 OT Problem List: Decreased strength;Decreased activity tolerance;Pain;Decreased safety awareness;Impaired balance (sitting and/or standing)      OT Treatment/Interventions: Self-care/ADL  training;Patient/family education;DME and/or AE instruction;Therapeutic exercise    OT Goals(Current goals can be found in the care plan section) Acute Rehab OT Goals Patient Stated Goal: Decrease pain OT Goal Formulation: With patient Time For Goal Achievement: 04/02/18 Potential to Achieve Goals: Good  OT Frequency: Min 2X/week   Barriers to D/C: Decreased caregiver support             AM-PAC PT "6 Clicks" Daily Activity     Outcome Measure Help from another person eating meals?: A Little Help from another person taking care of personal grooming?: A Little Help from another person toileting, which includes using toliet, bedpan, or urinal?: A Lot Help from another person bathing (including washing, rinsing, drying)?: A Lot Help from another person to put on and taking off regular upper body clothing?: A Little Help from another person to put on and taking off regular lower body clothing?: A Lot 6 Click Score: 15   End of Session Nurse Communication: Mobility status  Activity Tolerance: Patient tolerated treatment well Patient left: in chair;with call bell/phone within reach;with family/visitor present  OT Visit Diagnosis: Unsteadiness on feet (R26.81);Other abnormalities of gait and mobility (R26.89);History of falling (Z91.81);Pain Pain - part of body: (back)                Time: 8453-6468 OT Time Calculation (min): 11 min Charges:  OT General Charges $OT Visit: 1 Visit OT Evaluation $OT Eval Moderate Complexity: 1 Mod  Kari Baars, [place] [phone] Office- [phone]     Misbah Hornaday, Edwena Felty D 03/19/2018, 1:39 PM

## 2018-03-20 DIAGNOSIS — E1122 Type 2 diabetes mellitus with diabetic chronic kidney disease: Secondary | ICD-10-CM | POA: Diagnosis not present

## 2018-03-20 DIAGNOSIS — N186 End stage renal disease: Secondary | ICD-10-CM | POA: Diagnosis not present

## 2018-03-20 DIAGNOSIS — Z992 Dependence on renal dialysis: Secondary | ICD-10-CM | POA: Diagnosis not present

## 2018-03-20 LAB — PHOSPHORUS: Phosphorus: 4.8 mg/dL — ABNORMAL HIGH (ref 2.5–4.6)

## 2018-03-20 LAB — CBC
HCT: 27.7 % — ABNORMAL LOW (ref 39.0–52.0)
Hemoglobin: 8.9 g/dL — ABNORMAL LOW (ref 13.0–17.0)
MCH: 30.2 pg (ref 26.0–34.0)
MCHC: 32.1 g/dL (ref 30.0–36.0)
MCV: 93.9 fL (ref 80.0–100.0)
NRBC: 0 % (ref 0.0–0.2)
PLATELETS: 148 10*3/uL — AB (ref 150–400)
RBC: 2.95 MIL/uL — AB (ref 4.22–5.81)
RDW: 14.1 % (ref 11.5–15.5)
WBC: 7.6 10*3/uL (ref 4.0–10.5)

## 2018-03-20 LAB — BASIC METABOLIC PANEL
ANION GAP: 10 (ref 5–15)
BUN: 40 mg/dL — ABNORMAL HIGH (ref 6–20)
CO2: 27 mmol/L (ref 22–32)
Calcium: 9 mg/dL (ref 8.9–10.3)
Chloride: 90 mmol/L — ABNORMAL LOW (ref 98–111)
Creatinine, Ser: 7.2 mg/dL — ABNORMAL HIGH (ref 0.61–1.24)
GFR, EST AFRICAN AMERICAN: 9 mL/min — AB (ref >60)
GFR, EST NON AFRICAN AMERICAN: 7 mL/min — AB (ref >60)
GLUCOSE: 136 mg/dL — AB (ref 70–99)
POTASSIUM: 4.1 mmol/L (ref 3.5–5.1)
Sodium: 127 mmol/L — ABNORMAL LOW (ref 135–145)

## 2018-03-20 LAB — GLUCOSE, CAPILLARY
GLUCOSE-CAPILLARY: 110 mg/dL — AB (ref 70–99)
Glucose-Capillary: 140 mg/dL — ABNORMAL HIGH (ref 70–99)
Glucose-Capillary: 219 mg/dL — ABNORMAL HIGH (ref 70–99)

## 2018-03-20 MED ORDER — SODIUM CHLORIDE 0.9 % IV SOLN: 100.0000 mL | INTRAVENOUS | Status: DC | PRN

## 2018-03-20 MED ORDER — HEPARIN SODIUM (PORCINE) 1000 UNIT/ML DIALYSIS
5000.0000 [IU] | Freq: Once | INTRAMUSCULAR | Status: AC
  Administered 2018-03-20 – 2018-03-21 (×2): 5000 [IU] via INTRAVENOUS_CENTRAL

## 2018-03-20 MED ORDER — PRO-STAT SUGAR FREE PO LIQD
30.0000 mL | Freq: Two times a day (BID) | ORAL | Status: DC
  Administered 2018-03-20 – 2018-03-21 (×2): 30 mL via ORAL
  Filled 2018-03-20 (×2): qty 30

## 2018-03-20 MED ORDER — AMLODIPINE BESYLATE 5 MG PO TABS
5.0000 mg | ORAL_TABLET | Freq: Every day | ORAL | Status: DC
  Administered 2018-03-20: 5 mg via ORAL
  Filled 2018-03-20: qty 1

## 2018-03-20 MED ORDER — OXYCODONE HCL 5 MG PO TABS
ORAL_TABLET | ORAL | Status: AC
  Filled 2018-03-20: qty 2

## 2018-03-20 MED ORDER — HEPARIN SODIUM (PORCINE) 1000 UNIT/ML IJ SOLN
INTRAMUSCULAR | Status: AC
  Administered 2018-03-20: 5000 [IU] via INTRAVENOUS_CENTRAL
  Filled 2018-03-20: qty 5

## 2018-03-20 MED ORDER — CEFAZOLIN SODIUM-DEXTROSE 2-4 GM/100ML-% IV SOLN
2.0000 g | Freq: Once | INTRAVENOUS | Status: AC
  Administered 2018-03-20: 2 g via INTRAVENOUS
  Filled 2018-03-20: qty 100

## 2018-03-20 NOTE — Progress Notes (Signed)
PROGRESS NOTE  Russell Price GEX:[id number] DOB: May 26, 1960 DOA: 03/12/2018 PCP: Charlott Rakes, MD  HPI/Recap of past 25 hours: 57 year old with history of ESRD TTS, essential hypertension, diabetes mellitus type 2, CAD, glaucoma, right eye blindness came to the hospital with complains of lower back pain and bilateral lower extremity weakness.  His pain onset was very acute.  MRI showed stenosis with disc extrusion of L5-S1 with possible concerns of discitis?Marland Kitchen  MRI findings were not severe enough for surgical intervention per neurosurgery.  Patient's blood culture ended up growing streptococcal bacteremia. TEE was negative for endocarditis.  Infectious disease recommended treating with IV ceftezole and for about 6 weeks with Cefazolin during his dialysis until December 8.  Infectious disease will make outpatient follow-up with their service.  03/19/18: seen and examined w his wife at bedside. Reports significant weakness, poor appetite and significant lower back pain. PT assessed and recommended SNF to continue physical rehab with 24 hour supervision.  03/20/2018: Seen and examined at his bedside at the dialysis center.  Reports severe lower back pain.  Denies any chest pain or dyspnea.  Plan to dialyze today and again tomorrow to get back on schedule.  Did not get hemodialysis yesterday due to scheduling issues.  Hyponatremia.  Needs volume removal.  Assessment/Plan: Principal Problem:   Streptococcal bacteremia Active Problems:   Essential hypertension   DM (diabetes mellitus) with complications (HCC)   Blind right eye   Debility   ESRD (end stage renal disease) ([place])   CAD (coronary artery disease)   Fever   Acute low back pain   Bilateral leg weakness  Streptococcus bacteremia complicated by possible discitis Persistent lower back pain Continue IV antibiotics as recommended by ID IV ancef to end on 04/26/18 Repeat blood cx x 2 drawn on 03/16/18 negative to date Abnormal MRI  lumbar spine; showed stenosis with disc extrusion on L5-S1 but no obvious discitis.  Neurosurgery did not feel like patient required emergent/acute surgical intervention and recommended pain control. Continue pain management and add bowel regimen  Hypervolemic hyponatremia Sodium trending down from 133 to 127 Will dialyze today and again tomorrow Repeat BMP after dialysis tomorrow  Significant lower back pain, chronic Management as stated above IV Dilaudid 0.5 mg every 4 hours as needed for severe pain Will follow-up with neurosurgery outpatient  Chronic constipation Self reported no bowel movement in more than 1 week Continue bowel regimen w senokot 2 tabs bid, miralax daily, milk of magnesia and dulcolax  Generalized weakness and lower back pain Worsening generalized weakness PT recommends SNF and 24 hour supervision Fall precaution Pain management in place IV dilaudid prn for severe pain  Continue physical therapy Out of bed to chair with every shift  ESRD on HD TTS Hemodialysis today and again tomorrow to remove fluid  Anemia of chronic disease Hg stable Managed by nephrology  HTN BP stable C/w coreg, norvasc  HLD liptor  Type 2 DM complicated by hyperglycemia A1C 7.5 on 03/13/18 ISS Avoid hypoglycemia   Code Status: full   Family Communication: No family members at bedside today  Disposition Plan: SNF possibly tomorrow 03/21/2018 post dialysis or when nephrology signs off.   Consultants:  nephrology   Procedures:  HD  TEE  Antimicrobials:  IV ancef  DVT prophylaxis:  sq hep tid   Objective: Vitals:   03/20/18 0850 03/20/18 0900 03/20/18 0930 03/20/18 1000  BP: (!) 145/74 116/61 (!) 129/57 (!) 149/82  Pulse: 69 76 71 72  Resp:  Temp:      TempSrc:      SpO2:      Weight:        Intake/Output Summary (Last 24 hours) at 03/20/2018 1040 Last data filed at 03/19/2018 1500 Gross per 24 hour  Intake 300 ml  Output -  Net 300 ml     Filed Weights   03/18/18 2152 03/19/18 2100 03/20/18 0728  Weight: 72.6 kg 73 kg 77.2 kg    Exam:  . General: 57 y.o. year-old male frail and appears weak.  Alert and oriented x3.. . Cardiovascular: Regular rate and rhythm with no rubs or gallops.  No JVD or thyromegaly noted.   Marland Kitchen Respiratory: Mild rales at bases with no wheezes.  Good inspiratory effort. . Abdomen: Soft nontender nondistended with normal bowel sounds x4 quadrants. . Musculoskeletal: LUE swelling. 2/4 pulses in all 4 extremities. . Skin: No ulcerative lesions noted or rashes.  Left upper extremity fistula. Marland Kitchen Psychiatry: Mood is appropriate for condition and setting   Data Reviewed: CBC: Recent Labs  Lab 03/16/18 0533 03/17/18 0711 03/18/18 0657 03/19/18 0453 03/20/18 0447  WBC 10.1 6.7 7.0 7.2 7.6  HGB 9.8* 8.8* 9.3* 8.3* 8.9*  HCT 30.6* 27.2* 30.2* 26.2* 27.7*  MCV 94.7 94.1 95.9 94.6 93.9  PLT 140* 151 170 146* 154*   Basic Metabolic Panel: Recent Labs  Lab 03/14/18 1440 03/16/18 0533 03/17/18 0711 03/18/18 0657 03/19/18 0453 03/20/18 0447  NA 131* 132* 130* 133* 128* 127*  K 3.7 3.8 3.9 3.8 4.0 4.1  CL 95* 95* 93* 95* 92* 90*  CO2 25 24 24 26 25 27   GLUCOSE 257* 172* 165* 111* 147* 136*  BUN 59* 38* 52* 25* 32* 40*  CREATININE 7.40* 6.02* 7.36* 4.70* 6.06* 7.20*  CALCIUM 8.2* 8.9 8.7* 9.1 9.0 9.0  PHOS 4.3  --  5.4*  --   --   --    GFR: Estimated Creatinine Clearance: 11.8 mL/min (A) (by C-G formula based on SCr of 7.2 mg/dL (H)). Liver Function Tests: Recent Labs  Lab 03/14/18 1440 03/17/18 0711  ALBUMIN 2.3* 2.4*   No results for input(s): LIPASE, AMYLASE in the last 168 hours. No results for input(s): AMMONIA in the last 168 hours. Coagulation Profile: No results for input(s): INR, PROTIME in the last 168 hours. Cardiac Enzymes: No results for input(s): CKTOTAL, CKMB, CKMBINDEX, TROPONINI in the last 168 hours. BNP (last 3 results) No results for input(s): PROBNP in the  last 8760 hours. HbA1C: No results for input(s): HGBA1C in the last 72 hours. CBG: Recent Labs  Lab 03/18/18 2152 03/19/18 0743 03/19/18 1145 03/19/18 1651 03/19/18 2107  GLUCAP 132* 116* 166* 124* 181*   Lipid Profile: No results for input(s): CHOL, HDL, LDLCALC, TRIG, CHOLHDL, LDLDIRECT in the last 72 hours. Thyroid Function Tests: No results for input(s): TSH, T4TOTAL, FREET4, T3FREE, THYROIDAB in the last 72 hours. Anemia Panel: No results for input(s): VITAMINB12, FOLATE, FERRITIN, TIBC, IRON, RETICCTPCT in the last 72 hours. Urine analysis:    Component Value Date/Time   COLORURINE YELLOW 03/16/2018 1752   APPEARANCEUR HAZY (A) 03/16/2018 1752   LABSPEC 1.012 03/16/2018 1752   PHURINE 6.0 03/16/2018 1752   GLUCOSEU NEGATIVE 03/16/2018 1752   HGBUR SMALL (A) 03/16/2018 1752   BILIRUBINUR NEGATIVE 03/16/2018 1752   BILIRUBINUR negative 02/27/2015 1458   BILIRUBINUR neg 02/22/2013 1737   KETONESUR NEGATIVE 03/16/2018 1752   PROTEINUR 100 (A) 03/16/2018 1752   UROBILINOGEN 0.2 02/28/2015 0032   NITRITE NEGATIVE  03/16/2018 1752   LEUKOCYTESUR TRACE (A) 03/16/2018 1752   Sepsis Labs: @LABRCNTIP (procalcitonin:4,lacticidven:4)  ) Recent Results (from the past 240 hour(s))  Urine Culture     Status: Abnormal   Collection Time: 03/13/18 12:54 AM  Result Value Ref Range Status   Specimen Description URINE, RANDOM  Final   Special Requests NONE  Final   Culture (A)  Final    <10,000 COLONIES/mL INSIGNIFICANT GROWTH Performed at [facility] [address] [place], [place] [address]    Report Status 03/14/2018 FINAL  Final  Blood culture (routine x 2)     Status: Abnormal   Collection Time: 03/13/18  1:24 AM  Result Value Ref Range Status   Specimen Description BLOOD RIGHT FOREARM  Final   Special Requests   Final    BOTTLES DRAWN AEROBIC AND ANAEROBIC Blood Culture results may not be optimal due to an inadequate volume of blood received in culture bottles    Culture  Setup Time   Final    GRAM POSITIVE COCCI IN BOTH AEROBIC AND ANAEROBIC BOTTLES CRITICAL RESULT CALLED TO, READ BACK BY AND VERIFIED WITHKarsten Ro [facility] 03/13/18 2239 JDW Performed at [facility], [place] [address], [place], [place] [address]    Culture STREPTOCOCCUS GORDONII (A)  Final   Report Status 03/15/2018 FINAL  Final   Organism ID, Bacteria STREPTOCOCCUS GORDONII  Final      Susceptibility   Streptococcus gordonii - MIC*    PENICILLIN 0.25 INTERMEDIATE Intermediate     CEFTRIAXONE 0.25 SENSITIVE Sensitive     ERYTHROMYCIN 2 RESISTANT Resistant     LEVOFLOXACIN 1 SENSITIVE Sensitive     VANCOMYCIN 0.5 SENSITIVE Sensitive     * STREPTOCOCCUS GORDONII  Blood Culture ID Panel (Reflexed)     Status: Abnormal   Collection Time: 03/13/18  1:24 AM  Result Value Ref Range Status   Enterococcus species NOT DETECTED NOT DETECTED Final   Listeria monocytogenes NOT DETECTED NOT DETECTED Final   Staphylococcus species NOT DETECTED NOT DETECTED Final   Staphylococcus aureus (BCID) NOT DETECTED NOT DETECTED Final   Streptococcus species DETECTED (A) NOT DETECTED Final    Comment: Not Enterococcus species, Streptococcus agalactiae, Streptococcus pyogenes, or Streptococcus pneumoniae. CRITICAL RESULT CALLED TO, READ BACK BY AND VERIFIED WITH: J [facility] 03/13/18 2239 JDW    Streptococcus agalactiae NOT DETECTED NOT DETECTED Final   Streptococcus pneumoniae NOT DETECTED NOT DETECTED Final   Streptococcus pyogenes NOT DETECTED NOT DETECTED Final   Acinetobacter baumannii NOT DETECTED NOT DETECTED Final   Enterobacteriaceae species NOT DETECTED NOT DETECTED Final   Enterobacter cloacae complex NOT DETECTED NOT DETECTED Final   Escherichia coli NOT DETECTED NOT DETECTED Final   Klebsiella oxytoca NOT DETECTED NOT DETECTED Final   Klebsiella pneumoniae NOT DETECTED NOT DETECTED Final   Proteus species NOT DETECTED NOT DETECTED Final   Serratia marcescens NOT DETECTED  NOT DETECTED Final   Haemophilus influenzae NOT DETECTED NOT DETECTED Final   Neisseria meningitidis NOT DETECTED NOT DETECTED Final   Pseudomonas aeruginosa NOT DETECTED NOT DETECTED Final   Candida albicans NOT DETECTED NOT DETECTED Final   Candida glabrata NOT DETECTED NOT DETECTED Final   Candida krusei NOT DETECTED NOT DETECTED Final   Candida parapsilosis NOT DETECTED NOT DETECTED Final   Candida tropicalis NOT DETECTED NOT DETECTED Final  Blood culture (routine x 2)     Status: Abnormal   Collection Time: 03/13/18  1:34 AM  Result Value Ref Range Status  Specimen Description BLOOD RIGHT HAND  Final   Special Requests   Final    BOTTLES DRAWN AEROBIC AND ANAEROBIC Blood Culture results may not be optimal due to an inadequate volume of blood received in culture bottles   Culture  Setup Time   Final    IN BOTH AEROBIC AND ANAEROBIC BOTTLES GRAM POSITIVE COCCI CRITICAL RESULT CALLED TO, READ BACK BY AND VERIFIED WITH: J [facility] PHARMD 03/13/18 2239 JDW    Culture STREPTOCOCCUS GORDONII (A)  Final   Report Status 03/15/2018 FINAL  Final  Culture, blood (routine x 2)     Status: None (Preliminary result)   Collection Time: 03/16/18  9:52 AM  Result Value Ref Range Status   Specimen Description BLOOD BLOOD RIGHT HAND  Final   Special Requests AEROBIC BOTTLE ONLY Blood Culture adequate volume  Final   Culture   Final    NO GROWTH 3 DAYS Performed at [facility], [place] [address], [place], [place] [address]    Report Status PENDING  Incomplete  Culture, blood (routine x 2)     Status: None (Preliminary result)   Collection Time: 03/16/18  9:52 AM  Result Value Ref Range Status   Specimen Description BLOOD BLOOD RIGHT HAND  Final   Special Requests AEROBIC BOTTLE ONLY Blood Culture adequate volume  Final   Culture   Final    NO GROWTH 3 DAYS Performed at [facility], [place] [address], [place], [place] [address]    Report Status PENDING  Incomplete  Culture, Urine      Status: None   Collection Time: 03/16/18  5:15 PM  Result Value Ref Range Status   Specimen Description URINE, CLEAN CATCH  Final   Special Requests NONE  Final   Culture   Final    NO GROWTH Performed at [facility], [place] [address], [place], [place] [address]    Report Status 03/17/2018 FINAL  Final      Studies: No results found.  Scheduled Meds: . amLODipine  5 mg Oral Daily  . atorvastatin  40 mg Oral q1800  . carvedilol  6.25 mg Oral BID WC  . Chlorhexidine Gluconate Cloth  6 each Topical Q0600  . darbepoetin (ARANESP) injection - DIALYSIS  40 mcg Intravenous Q Tue-HD  . doxercalciferol  3 mcg Intravenous Q T,Th,Sa-HD  . feeding supplement (NEPRO CARB STEADY)  237 mL Oral BID BM  . feeding supplement (PRO-STAT SUGAR FREE 64)  30 mL Oral BID  . furosemide  80 mg Oral BID  . gabapentin  300 mg Oral Daily  . heparin  5,000 Units Subcutaneous Q8H  . insulin aspart  0-5 Units Subcutaneous QHS  . insulin aspart  0-9 Units Subcutaneous TID WC  . insulin glargine  10 Units Subcutaneous BID  . multivitamin  1 tablet Oral QHS  . oxyCODONE      . polyethylene glycol  17 g Oral BID  . senna-docusate  2 tablet Oral BID  . sevelamer carbonate  800 mg Oral TID WC  . timolol  1 drop Left Eye BID    Continuous Infusions: . sodium chloride 10 mL/hr at 03/13/18 1738  . sodium chloride    . sodium chloride    .  ceFAZolin (ANCEF) IV       LOS: 5 days     Kayleen Memos, MD [facility] Pager [phone]  If 7PM-7AM, please contact night-coverage [email or web address] Password [email or web address] 03/20/2018, 10:40 AM

## 2018-03-20 NOTE — Progress Notes (Addendum)
 [facility] Progress Note   Subjective: On HD off schedule today. MSW working to find SNF placement for rehab and pain management. Says back pain is better right now. Appears comfortable.   Objective Vitals:   03/20/18 0845 03/20/18 0850 03/20/18 0900 03/20/18 0930  BP: (!) 154/70 (!) 145/74 116/61 (!) 129/57  Pulse: 74 69 76 71  Resp:      Temp:      TempSrc:      SpO2:      Weight:       Physical Exam General:Chronically ill appearing male in NAD Heart:S1,S2 RRR Lungs:CTAB Abdomen:soft, NT Extremities:No LE Edema Dialysis Access:LUA AVG cannulated at present   Additional Objective Labs: Basic Metabolic Panel: Recent Labs  Lab 03/14/18 1440  03/17/18 0711 03/18/18 0657 03/19/18 0453 03/20/18 0447  NA 131*   < > 130* 133* 128* 127*  K 3.7   < > 3.9 3.8 4.0 4.1  CL 95*   < > 93* 95* 92* 90*  CO2 25   < > 24 26 25 27   GLUCOSE 257*   < > 165* 111* 147* 136*  BUN 59*   < > 52* 25* 32* 40*  CREATININE 7.40*   < > 7.36* 4.70* 6.06* 7.20*  CALCIUM 8.2*   < > 8.7* 9.1 9.0 9.0  PHOS 4.3  --  5.4*  --   --   --    < > = values in this interval not displayed.   Liver Function Tests: Recent Labs  Lab 03/14/18 1440 03/17/18 0711  ALBUMIN 2.3* 2.4*   No results for input(s): LIPASE, AMYLASE in the last 168 hours. CBC: Recent Labs  Lab 03/16/18 0533 03/17/18 0711 03/18/18 0657 03/19/18 0453 03/20/18 0447  WBC 10.1 6.7 7.0 7.2 7.6  HGB 9.8* 8.8* 9.3* 8.3* 8.9*  HCT 30.6* 27.2* 30.2* 26.2* 27.7*  MCV 94.7 94.1 95.9 94.6 93.9  PLT 140* 151 170 146* 148*   Blood Culture    Component Value Date/Time   SDES URINE, CLEAN CATCH 03/16/2018 1715   SPECREQUEST NONE 03/16/2018 1715   CULT  03/16/2018 1715    NO GROWTH Performed at [facility], [place] [address], [place], [place] [address]    REPTSTATUS 03/17/2018 FINAL 03/16/2018 1715    Cardiac Enzymes: No results for input(s): CKTOTAL, CKMB, CKMBINDEX, TROPONINI in the last 168  hours. CBG: Recent Labs  Lab 03/18/18 2152 03/19/18 0743 03/19/18 1145 03/19/18 1651 03/19/18 2107  GLUCAP 132* 116* 166* 124* 181*   Iron Studies: No results for input(s): IRON, TIBC, TRANSFERRIN, FERRITIN in the last 72 hours. @lablastinr3 @ Studies/Results: No results found. Medications: . sodium chloride 10 mL/hr at 03/13/18 1738  . sodium chloride    . sodium chloride    .  ceFAZolin (ANCEF) IV     . amLODipine  5 mg Oral Daily  . atorvastatin  40 mg Oral q1800  . carvedilol  6.25 mg Oral BID WC  . Chlorhexidine Gluconate Cloth  6 each Topical Q0600  . darbepoetin (ARANESP) injection - DIALYSIS  40 mcg Intravenous Q Tue-HD  . doxercalciferol  3 mcg Intravenous Q T,Th,Sa-HD  . feeding supplement (NEPRO CARB STEADY)  237 mL Oral BID BM  . furosemide  80 mg Oral BID  . gabapentin  300 mg Oral Daily  . heparin  5,000 Units Subcutaneous Q8H  . insulin aspart  0-5 Units Subcutaneous QHS  . insulin aspart  0-9 Units Subcutaneous TID WC  . insulin glargine  10 Units Subcutaneous BID  . multivitamin  1 tablet Oral QHS  . oxyCODONE      . polyethylene glycol  17 g Oral BID  . senna-docusate  2 tablet Oral BID  . sevelamer carbonate  800 mg Oral TID WC  . timolol  1 drop Left Eye BID     Dialysis Orders: TTS -NW 4.25hrs, BFR400, GUR427, EDW 71kg,2K/2.25Ca  Access:LU AVG Heparin5000 unit bolus, 1500 mid run Hectorol31mcg IV qHD  Assessment/Plan: 1. Strepgordonaebacteremia - source unclear-presumed discitis. Repeat BC, UA, Urine cultures.Tmax99.6in last 24hrs, BC+strep gordonii.On IV Ancef-continue Ancef 2 grams for 6 weeks through 04/25/18.ID followingTTE show no endocarditis. Primary/ID.MRI negative for discitis.  2. Lower back pain - MRI w/o showed stenosis w/disc extrusion @L5 /S1 per radiology no indication of infection. Neurosurgery did not think MRI findings severe enough for surgery however now with persistent back issues and inability to  ambulate. PT consult, per primary.  3. ESRD - On HD TTS schedule.Did not get HD yesterday D/T scheduling issues. HD today and again tomorrow to get back on schedule if still in hospital. 4. Hypertension/volume - BP elevated probably related to pain issues. .On norvasc and coreg. Above OP EDW last HD. Sodium 127 needs volume removal. Push to OP EDW today.  5. Anemia of CKD - Hgb 8.9. Follow trends.Aranesp 40 mcg IV with HD 03/17/18 6. Secondary Hyperparathyroidism -Ca 9.0 Last Phos 5.4Continue Hectorol and renvela 7. Nutrition - Albumin 2.4.Renal/Carb mod diet w/fluid restriction.Renavite, Add prostat 8. DM-per primary  Jimmye Norman.  NP-C 03/20/2018, 9:53 AM  [employer] [phone]

## 2018-03-20 NOTE — Clinical Social Work Note (Signed)
CSW continuing to follow and MD's progress note reviewed. Patient may discharge to SNF  tomorrow 03/21/2018 post dialysis or when nephrology signs off. Call made to admissions director at [facility] and update provided. Handoff completed for weekend CSW.  Arden Tinoco Givens, MSW, LCSW Licensed Clinical Social Worker [place] [phone]

## 2018-03-21 DIAGNOSIS — R569 Unspecified convulsions: Secondary | ICD-10-CM | POA: Diagnosis not present

## 2018-03-21 DIAGNOSIS — E1122 Type 2 diabetes mellitus with diabetic chronic kidney disease: Secondary | ICD-10-CM | POA: Diagnosis not present

## 2018-03-21 DIAGNOSIS — I2510 Atherosclerotic heart disease of native coronary artery without angina pectoris: Secondary | ICD-10-CM | POA: Diagnosis not present

## 2018-03-21 DIAGNOSIS — R279 Unspecified lack of coordination: Secondary | ICD-10-CM | POA: Diagnosis not present

## 2018-03-21 DIAGNOSIS — K5900 Constipation, unspecified: Secondary | ICD-10-CM | POA: Diagnosis not present

## 2018-03-21 DIAGNOSIS — E1129 Type 2 diabetes mellitus with other diabetic kidney complication: Secondary | ICD-10-CM | POA: Diagnosis not present

## 2018-03-21 DIAGNOSIS — E113512 Type 2 diabetes mellitus with proliferative diabetic retinopathy with macular edema, left eye: Secondary | ICD-10-CM | POA: Diagnosis not present

## 2018-03-21 DIAGNOSIS — I10 Essential (primary) hypertension: Secondary | ICD-10-CM | POA: Diagnosis not present

## 2018-03-21 DIAGNOSIS — I120 Hypertensive chronic kidney disease with stage 5 chronic kidney disease or end stage renal disease: Secondary | ICD-10-CM | POA: Diagnosis not present

## 2018-03-21 DIAGNOSIS — M6281 Muscle weakness (generalized): Secondary | ICD-10-CM | POA: Diagnosis not present

## 2018-03-21 DIAGNOSIS — B955 Unspecified streptococcus as the cause of diseases classified elsewhere: Secondary | ICD-10-CM | POA: Diagnosis not present

## 2018-03-21 DIAGNOSIS — R41841 Cognitive communication deficit: Secondary | ICD-10-CM | POA: Diagnosis not present

## 2018-03-21 DIAGNOSIS — N2581 Secondary hyperparathyroidism of renal origin: Secondary | ICD-10-CM | POA: Diagnosis not present

## 2018-03-21 DIAGNOSIS — D649 Anemia, unspecified: Secondary | ICD-10-CM | POA: Diagnosis not present

## 2018-03-21 DIAGNOSIS — Z743 Need for continuous supervision: Secondary | ICD-10-CM | POA: Diagnosis not present

## 2018-03-21 DIAGNOSIS — E119 Type 2 diabetes mellitus without complications: Secondary | ICD-10-CM | POA: Diagnosis not present

## 2018-03-21 DIAGNOSIS — N186 End stage renal disease: Secondary | ICD-10-CM | POA: Diagnosis not present

## 2018-03-21 DIAGNOSIS — D631 Anemia in chronic kidney disease: Secondary | ICD-10-CM | POA: Diagnosis not present

## 2018-03-21 DIAGNOSIS — M545 Low back pain: Secondary | ICD-10-CM | POA: Diagnosis not present

## 2018-03-21 DIAGNOSIS — A408 Other streptococcal sepsis: Secondary | ICD-10-CM | POA: Diagnosis not present

## 2018-03-21 DIAGNOSIS — M48061 Spinal stenosis, lumbar region without neurogenic claudication: Secondary | ICD-10-CM | POA: Diagnosis not present

## 2018-03-21 DIAGNOSIS — Z992 Dependence on renal dialysis: Secondary | ICD-10-CM | POA: Diagnosis not present

## 2018-03-21 DIAGNOSIS — I499 Cardiac arrhythmia, unspecified: Secondary | ICD-10-CM | POA: Diagnosis not present

## 2018-03-21 DIAGNOSIS — M549 Dorsalgia, unspecified: Secondary | ICD-10-CM | POA: Diagnosis not present

## 2018-03-21 DIAGNOSIS — R7881 Bacteremia: Secondary | ICD-10-CM | POA: Diagnosis not present

## 2018-03-21 DIAGNOSIS — M5489 Other dorsalgia: Secondary | ICD-10-CM | POA: Diagnosis not present

## 2018-03-21 DIAGNOSIS — E118 Type 2 diabetes mellitus with unspecified complications: Secondary | ICD-10-CM | POA: Diagnosis not present

## 2018-03-21 DIAGNOSIS — M4807 Spinal stenosis, lumbosacral region: Secondary | ICD-10-CM | POA: Diagnosis not present

## 2018-03-21 DIAGNOSIS — R2689 Other abnormalities of gait and mobility: Secondary | ICD-10-CM | POA: Diagnosis not present

## 2018-03-21 DIAGNOSIS — M5440 Lumbago with sciatica, unspecified side: Secondary | ICD-10-CM | POA: Diagnosis not present

## 2018-03-21 DIAGNOSIS — B954 Other streptococcus as the cause of diseases classified elsewhere: Secondary | ICD-10-CM | POA: Diagnosis not present

## 2018-03-21 LAB — CBC
HEMATOCRIT: 28.6 % — AB (ref 39.0–52.0)
HEMOGLOBIN: 8.9 g/dL — AB (ref 13.0–17.0)
MCH: 29.6 pg (ref 26.0–34.0)
MCHC: 31.1 g/dL (ref 30.0–36.0)
MCV: 95 fL (ref 80.0–100.0)
NRBC: 0 % (ref 0.0–0.2)
Platelets: 130 10*3/uL — ABNORMAL LOW (ref 150–400)
RBC: 3.01 MIL/uL — ABNORMAL LOW (ref 4.22–5.81)
RDW: 14.1 % (ref 11.5–15.5)
WBC: 7.2 10*3/uL (ref 4.0–10.5)

## 2018-03-21 LAB — CULTURE, BLOOD (ROUTINE X 2)
Culture: NO GROWTH
Culture: NO GROWTH
SPECIAL REQUESTS: ADEQUATE
Special Requests: ADEQUATE

## 2018-03-21 LAB — BASIC METABOLIC PANEL
Anion gap: 7 (ref 5–15)
BUN: 24 mg/dL — ABNORMAL HIGH (ref 6–20)
CALCIUM: 8.7 mg/dL — AB (ref 8.9–10.3)
CHLORIDE: 95 mmol/L — AB (ref 98–111)
CO2: 28 mmol/L (ref 22–32)
Creatinine, Ser: 4.92 mg/dL — ABNORMAL HIGH (ref 0.61–1.24)
GFR calc Af Amer: 14 mL/min — ABNORMAL LOW (ref >60)
GFR calc non Af Amer: 12 mL/min — ABNORMAL LOW (ref >60)
GLUCOSE: 203 mg/dL — AB (ref 70–99)
Potassium: 3.8 mmol/L (ref 3.5–5.1)
Sodium: 130 mmol/L — ABNORMAL LOW (ref 135–145)

## 2018-03-21 LAB — GLUCOSE, CAPILLARY
GLUCOSE-CAPILLARY: 131 mg/dL — AB (ref 70–99)
Glucose-Capillary: 190 mg/dL — ABNORMAL HIGH (ref 70–99)

## 2018-03-21 MED ORDER — SENNOSIDES-DOCUSATE SODIUM 8.6-50 MG PO TABS: 1.0000 | ORAL_TABLET | Freq: Every day | ORAL | 0 refills | Status: AC

## 2018-03-21 MED ORDER — POLYETHYLENE GLYCOL 3350 17 G PO PACK: 17.0000 g | PACK | Freq: Every day | ORAL | 0 refills | Status: DC

## 2018-03-21 MED ORDER — AMLODIPINE BESYLATE 10 MG PO TABS: 10.0000 mg | ORAL_TABLET | Freq: Every day | ORAL | 0 refills | Status: DC

## 2018-03-21 MED ORDER — HYDROMORPHONE HCL 1 MG/ML IJ SOLN
INTRAMUSCULAR | Status: AC
  Administered 2018-03-21: 0.5 mg via INTRAVENOUS
  Filled 2018-03-21: qty 0.5

## 2018-03-21 MED ORDER — HEPARIN SODIUM (PORCINE) 1000 UNIT/ML IJ SOLN
INTRAMUSCULAR | Status: AC
  Administered 2018-03-21: 5000 [IU] via INTRAVENOUS_CENTRAL
  Filled 2018-03-21: qty 6

## 2018-03-21 MED ORDER — AMLODIPINE BESYLATE 10 MG PO TABS
10.0000 mg | ORAL_TABLET | Freq: Every day | ORAL | Status: DC
  Administered 2018-03-21: 10 mg via ORAL
  Filled 2018-03-21: qty 1

## 2018-03-21 MED ORDER — HEPARIN SODIUM (PORCINE) 1000 UNIT/ML IJ SOLN
INTRAMUSCULAR | Status: AC
  Filled 2018-03-21: qty 1

## 2018-03-21 MED ORDER — PRO-STAT SUGAR FREE PO LIQD: 30.0000 mL | Freq: Two times a day (BID) | ORAL | 0 refills | Status: DC

## 2018-03-21 MED ORDER — LACTULOSE 10 GM/15ML PO SOLN: 20.0000 g | Freq: Two times a day (BID) | ORAL | 0 refills | Status: AC | PRN

## 2018-03-21 MED ORDER — BISACODYL 10 MG RE SUPP: 10.0000 mg | Freq: Every day | RECTAL | 0 refills | Status: DC | PRN

## 2018-03-21 NOTE — Progress Notes (Signed)
Verified w MD that patient will DC to SNF. SNF DC facilitated by CSW. CM signing off.

## 2018-03-21 NOTE — Progress Notes (Addendum)
I have seen and examined this patient and agree with the plan of care .  Patient seen on dialysis, Issues still revolving around mobility and back pain. Continue to work with PT and control pain  Empiric treatment for diskitis    Sherril Croon 03/21/2018, 2:24 PM  [facility] Progress Note   Dialysis Orders: TTS -NW 4.25hrs, BFR400, DFR800, EDW 71kg,2K/2.25Ca  Access:LU AVG Heparin5000 unit bolus, 1500 mid run Hectorol14mcg IV qHD  Assessment/Plan: 1. Strepgordonaebacteremia - source unclear-presumed discitis. Repeat BC, UA, Urine cultures.Tmax99.6in last 24hrs, BC+strep gordonii.On IV Ancef-continue Ancef 2 grams for 6 weeks through 04/25/18.ID followingTTE show no endocarditis. Primary/ID.MRI negative for discitis.  2. Lower back pain - MRI w/o showed stenosis w/disc extrusion @L5 /S1 per radiology no indication of infection.Neurosurgery did not think MRI findings severe enough for surgery however now with persistent back issues and inability to ambulate. Being treated empirically as a diskitis 3. ESRD - On HD TTS - pre wt 74 today - bed scale - try for EDW today -in bed - with back pain - really should have tried recliner HD before d/c - home he can tolerate. Hyponatremic Friday - lowering volume labs today pending 4. Hypertension/volume -BP up in part due to volume- Net UF 3 L Friday - try for another 3 today 5. Anemia of CKD - Hgb 8.9. 11/1 Follow trends.Aranesp 40 mcg IV with HD 03/17/18 6. Secondary Hyperparathyroidism -Continue Hectorol and renvela 7. Nutrition - Albumin 2.4.Renal/Carb mod diet w/fluid restriction.Renavite, Add prostat Intake marginal - suspect he may have actually have lost weight  8. DM-per primary   Myriam Jacobson, PA-C [place] 03/21/2018,8:38 AM  LOS: 6 days   Subjective:   C/o back pain . For probably d/c today to SNF  Objective Vitals:   03/20/18 1329 03/20/18 1658  03/20/18 2113 03/21/18 0425  BP: 127/70 (!) 153/72 132/61 (!) 174/63  Pulse: 78 80 62 77  Resp: 18 18 16 16   Temp: 98.5 F (36.9 C) (!) 100.4 F (38 C) 97.7 F (36.5 C) 99.2 F (37.3 C)  TempSrc: Oral Oral Oral Oral  SpO2: 100% 98% 97% 98%  Weight:    74.4 kg   Physical Exam General: somewhat uncomfortable supine on HD Heart: RRR Lungs: no rales breathing easily Abdomen: soft NT Extremities: no LE edema Dialysis Access: left AVGG   Additional Objective Labs: Basic Metabolic Panel: Recent Labs  Lab 03/14/18 1440  03/17/18 0711 03/18/18 0657 03/19/18 0453 03/20/18 0447 03/20/18 1000  NA 131*   < > 130* 133* 128* 127*  --   K 3.7   < > 3.9 3.8 4.0 4.1  --   CL 95*   < > 93* 95* 92* 90*  --   CO2 25   < > 24 26 25 27   --   GLUCOSE 257*   < > 165* 111* 147* 136*  --   BUN 59*   < > 52* 25* 32* 40*  --   CREATININE 7.40*   < > 7.36* 4.70* 6.06* 7.20*  --   CALCIUM 8.2*   < > 8.7* 9.1 9.0 9.0  --   PHOS 4.3  --  5.4*  --   --   --  4.8*   < > = values in this interval not displayed.   Liver Function Tests: Recent Labs  Lab 03/14/18 1440 03/17/18 0711  ALBUMIN 2.3* 2.4*   No results for input(s): LIPASE, AMYLASE in the last 168  hours. CBC: Recent Labs  Lab 03/17/18 0711 03/18/18 0657 03/19/18 0453 03/20/18 0447 03/21/18 0554  WBC 6.7 7.0 7.2 7.6 7.2  HGB 8.8* 9.3* 8.3* 8.9* 8.9*  HCT 27.2* 30.2* 26.2* 27.7* 28.6*  MCV 94.1 95.9 94.6 93.9 95.0  PLT 151 170 146* 148* 130*   Blood Culture    Component Value Date/Time   SDES URINE, CLEAN CATCH 03/16/2018 1715   [place] 03/16/2018 1715   CULT  03/16/2018 1715    NO GROWTH Performed at [facility], [place] [address] [place], [place] [address]    REPTSTATUS 03/17/2018 FINAL 03/16/2018 1715    Cardiac Enzymes: No results for input(s): CKTOTAL, CKMB, CKMBINDEX, TROPONINI in the last 168 hours. CBG: Recent Labs  Lab 03/19/18 2107 03/20/18 1329 03/20/18 1657 03/20/18 2110 03/21/18 0752   GLUCAP 181* 110* 140* 219* 190*   Iron Studies: No results for input(s): IRON, TIBC, TRANSFERRIN, FERRITIN in the last 72 hours. Lab Results  Component Value Date   INR 1.09 02/07/2017   INR 1.0 03/01/2016   INR 1.29 09/03/2015   Studies/Results: No results found. Medications: . sodium chloride 10 mL/hr at 03/13/18 1738  . sodium chloride    . sodium chloride    .  ceFAZolin (ANCEF) IV     . amLODipine  10 mg Oral Daily  . atorvastatin  40 mg Oral q1800  . carvedilol  6.25 mg Oral BID WC  . Chlorhexidine Gluconate Cloth  6 each Topical Q0600  . darbepoetin (ARANESP) injection - DIALYSIS  40 mcg Intravenous Q Tue-HD  . doxercalciferol  3 mcg Intravenous Q T,Th,Sa-HD  . feeding supplement (NEPRO CARB STEADY)  237 mL Oral BID BM  . feeding supplement (PRO-STAT SUGAR FREE 64)  30 mL Oral BID  . furosemide  80 mg Oral BID  . gabapentin  300 mg Oral Daily  . heparin      . heparin      . heparin  5,000 Units Subcutaneous Q8H  . insulin aspart  0-5 Units Subcutaneous QHS  . insulin aspart  0-9 Units Subcutaneous TID WC  . insulin glargine  10 Units Subcutaneous BID  . multivitamin  1 tablet Oral QHS  . polyethylene glycol  17 g Oral BID  . senna-docusate  2 tablet Oral BID  . sevelamer carbonate  800 mg Oral TID WC  . timolol  1 drop Left Eye BID

## 2018-03-21 NOTE — Progress Notes (Signed)
03/21/18 4:34 PM Patient discharged home per MD. Discharge instructions provided to spouse and patient. Dose of ancef administered after hemodialysis prior to discharge. Report called to [facility] at [facility]. [facility] provided transport. Bartholomew Crews, RN

## 2018-03-21 NOTE — Discharge Summary (Addendum)
Discharge Summary  Russell Price:[id number] DOB: 24-Dec-1960  PCP: Charlott Rakes, MD  Admit date: 03/12/2018 Discharge date: 03/21/2018  Time spent: 35 minutes  Recommendations for Outpatient Follow-up:  1. Follow-up with PCP 2. Follow-up with neurosurgery 3. Follow-up with nephrology 4. Continue hemodialysis as scheduled 5. Continue physical therapy 6. Fall precautions  Discharge Diagnoses:  Active Hospital Problems   Diagnosis Date Noted  . Streptococcal bacteremia 03/14/2018  . Fever 03/13/2018  . Acute low back pain 03/13/2018  . Bilateral leg weakness 03/13/2018  . CAD (coronary artery disease)   . ESRD (end stage renal disease) ([facility])   . Debility 09/08/2015  . DM (diabetes mellitus) with complications ([facility]) 70/35/0093  . Essential hypertension 11/11/2014  . Blind right eye 11/11/2014    Resolved Hospital Problems   Diagnosis Date Noted Date Resolved  . Bacteremia 03/15/2018 03/16/2018    Discharge Condition: Stable  Diet recommendation: Resume previous diet: renal carb modified diet  Vitals:   03/21/18 1000 03/21/18 1030  BP: 132/70 (!) 162/68  Pulse: 68 67  Resp: 10   Temp:    SpO2:      History of present illness:  57 year old with history of ESRD TTS, essential hypertension, diabetes mellitus type 2, CAD,glaucoma, right eye blindness came to the hospital with complains of lower back pain and bilateral lower extremity weakness. His pain onset was very acute.MRI showed stenosis with disc extrusion of L5-S1 with possible concerns of discitis?Marland Kitchen MRI findings were not severe enough for surgical intervention per neurosurgery. Patient's blood culture ended up growing streptococcal bacteremia. TEE was negative for endocarditis. Infectious disease recommended treating with IV ceftezole and for about 6 weeks with Cefazolinduring his dialysis until December 8. Infectious disease will make outpatient follow-up with their service.  Hospital course  complicated by generalized weakness, poor appetite and significant lower back pain.  PT assessed and recommended SNF with 24-hour supervision.  03/21/2018: Patient seen and examined at his bedside at hemodialysis.  No acute events overnight.  He has no new complaints.  Lower back pain is still present and he is taking pain medications for it.  Informed patient that he will need to follow-up with neurosurgery post hospitalization.  He understands and agrees to plan.  On the day of discharge, the patient was hemodynamically stable.   Hospital Course:  Principal Problem:   Streptococcal bacteremia Active Problems:   Essential hypertension   DM (diabetes mellitus) with complications ([facility])   Blind right eye   Debility   ESRD (end stage renal disease) ([facility])   CAD (coronary artery disease)   Fever   Acute low back pain   Bilateral leg weakness  Streptococcus bacteremia complicated by possible discitis Persistent lower back pain Continue IV antibiotics as recommended by ID IV ancef to end on 04/26/18 Repeat blood cx x 2 drawn on 03/16/18 negative to date Abnormal MRI lumbar spine; showed stenosis with disc extrusion on L5-S1 but no obvious discitis. No plan for surgical intervention at this time per neurosurgery.   Continue pain management and bowel regimen  Resolving hypervolemic hyponatremia Serum sodium is improving with dialysis up to 130 from 127 on 03/21/2018.  Dialyzed again today Continue hemodialysis outpatient as scheduled Tuesday Thursday Saturday  Significant lower back pain, chronic Management as stated above Oxycodone as needed for severe pain Follow-up with neurosurgery outpatient Spoke with PA Costella, neurosurgery who requested the patient call the office on Monday 03/23/18 for f/u appointment with Dr Saintclair Halsted, neurosurgery  Resolving chronic constipation Continue bowel  regimen daily while on opiates   Generalized weakness and lower back pain PT recommends SNF  and 24 hour supervision Fall precaution  ESRD on HD TTS Continue hemodialysis outpatient as scheduled  Anemia of chronic disease Hg stable Managed by nephrology  HTN BP stable C/w coreg, norvasc  HLD lipitor  Type 2 DM complicated by hyperglycemia A1C 7.5 on 03/13/18 ISS Avoid hypoglycemia   Code Status: full    Consultants:  nephrology   Procedures:  HD  TEE  Antimicrobials:  IV ancef  DVT prophylaxis:  sq hep tid   Discharge Exam: BP (!) 162/68   Pulse 67   Temp 98.3 F (36.8 C) (Oral)   Resp 10   Wt 74 kg   SpO2 98%   BMI 22.75 kg/m  . General: 57 y.o. year-old male well developed well nourished in no acute distress.  Alert and oriented x3. . Cardiovascular: Regular rate and rhythm with no rubs or gallops.  No thyromegaly or JVD noted.   Marland Kitchen Respiratory: Clear to auscultation with no wheezes or rales. Good inspiratory effort. . Abdomen: Soft nontender nondistended with normal bowel sounds x4 quadrants. . Musculoskeletal: No lower extremity edema. 2/4 pulses in all 4 extremities. . Skin: No ulcerative lesions noted or rashes . Psychiatry: Mood is appropriate for condition and setting  Discharge Instructions You were cared for by a hospitalist during your hospital stay. If you have any questions about your discharge medications or the care you received while you were in the hospital after you are discharged, you can call the unit and asked to speak with the hospitalist on call if the hospitalist that took care of you is not available. Once you are discharged, your primary care physician will handle any further medical issues. Please note that NO REFILLS for any discharge medications will be authorized once you are discharged, as it is imperative that you return to your primary care physician (or establish a relationship with a primary care physician if you do not have one) for your aftercare needs so that they can reassess your need for  medications and monitor your lab values.   Allergies as of 03/21/2018   No Known Allergies     Medication List    TAKE these medications   accu-chek softclix lancets Use as instructed 3 times daily before meals and at bedtime. E11.9   ACCU-CHEK SOFTCLIX LANCETS lancets Use as instructed 3 times daily before meals and at bedtime. E11.9   amLODipine 10 MG tablet Commonly known as:  NORVASC Take 1 tablet (10 mg total) by mouth daily. What changed:    medication strength  how much to take   aspirin 81 MG chewable tablet Chew 1 tablet (81 mg total) by mouth daily. What changed:  when to take this   atorvastatin 40 MG tablet Commonly known as:  LIPITOR Take 1 tablet (40 mg total) by mouth daily at 6 PM.   b complex-vitamin c-folic acid 0.8 MG Tabs tablet Take 1 tablet by mouth at bedtime.   bisacodyl 10 MG suppository Commonly known as:  DULCOLAX Place 1 suppository (10 mg total) rectally daily as needed for moderate constipation.   calcitRIOL 0.25 MCG capsule Commonly known as:  ROCALTROL Take 1 capsule (0.25 mcg total) by mouth daily.   carvedilol 6.25 MG tablet Commonly known as:  COREG TAKE 2 TABLETS BY MOUTH 2 TIMES DAILY WITH A MEAL. What changed:  See the new instructions.   ceFAZolin 2-4 GM/100ML-% IVPB Commonly known as:  ANCEF Inject 100 mLs (2 g total) into the vein every Tuesday, Thursday, and Saturday at 6 PM.   cyclobenzaprine 10 MG tablet Commonly known as:  FLEXERIL Take 1 tablet (10 mg total) by mouth 3 (three) times daily as needed for up to 30 doses for muscle spasms.   Darbepoetin Alfa 150 MCG/0.3ML Sosy injection Commonly known as:  ARANESP Inject 0.3 mLs (150 mcg total) into the vein every Saturday with hemodialysis.   feeding supplement (NEPRO CARB STEADY) Liqd Take 237 mLs by mouth 2 (two) times daily between meals.   feeding supplement (PRO-STAT SUGAR FREE 64) Liqd Take 30 mLs by mouth 2 (two) times daily.   furosemide 80 MG  tablet Commonly known as:  LASIX TAKE 1 TABLET BY MOUTH 2 TIMES DAILY. What changed:  when to take this   gabapentin 300 MG capsule Commonly known as:  NEURONTIN Take 1 capsule (300 mg total) by mouth daily.   glucose blood test strip Use 3 times daily before meals and at bedtime. E11.9   hydrocortisone 2.5 % rectal cream Commonly known as:  ANUSOL-HC Place rectally 2 (two) times daily.   insulin aspart 100 UNIT/ML injection Commonly known as:  novoLOG 0-12 units 3 times daily before meals as well as per sliding scale   insulin glargine 100 UNIT/ML injection Commonly known as:  LANTUS Inject subcutaneously 10 units of Lantus in the morning and 15 units of Lantus in the evening What changed:    how much to take  how to take this  when to take this  additional instructions   lactulose 10 GM/15ML solution Commonly known as:  CHRONULAC Take 30 mLs (20 g total) by mouth 2 (two) times daily as needed for up to 7 days for moderate constipation or severe constipation.   ondansetron 4 MG tablet Commonly known as:  ZOFRAN Take 1 tablet (4 mg total) by mouth every 8 (eight) hours as needed for nausea or vomiting.   oxyCODONE 5 MG immediate release tablet Commonly known as:  Oxy IR/ROXICODONE Take 1 tablet (5 mg total) by mouth every 6 (six) hours as needed for up to 5 days for moderate pain or severe pain. What changed:  reasons to take this   polyethylene glycol packet Commonly known as:  MIRALAX / GLYCOLAX Take 17 g by mouth daily.   RENAGEL 800 MG tablet Generic drug:  sevelamer Take 800-1,600 mg by mouth See admin instructions. Take 1600 mg by mouth 3 times daily with meals and take 800 mg by mouth twice daily with snacks   senna-docusate 8.6-50 MG tablet Commonly known as:  Senokot-S Take 1 tablet by mouth at bedtime.   timolol 0.5 % ophthalmic solution Commonly known as:  TIMOPTIC Place 1 drop into the left eye 2 (two) times daily.   TRUE METRIX METER Devi 1  each by Does not apply route 3 (three) times daily.   ACCU-CHEK AVIVA device Use as instructed 3 times daily before meals and at bedtime. E11.9            Durable Medical Equipment  (From admission, onward)         Start     Ordered   03/18/18 0933  For home use only DME Hospital bed  Once    Question Answer Comment  The above medical condition requires: Patient requires the ability to reposition frequently   Bed type Semi-electric   Support Surface: Gel Overlay      03/18/18 0933   03/16/18  1314  For home use only DME Walker rolling  Once    Question:  Patient needs a walker to treat with the following condition  Answer:  Generalized weakness   03/16/18 1313   03/16/18 1314  For home use only DME 3 n 1  Once     03/16/18 1313         No Known Allergies Follow-up Information    [facility] Follow up.   Why:  Hospital Bed, Bedside Commode, Best boy information: [address] [place] [address] [phone]        [place]. Schedule an appointment as soon as possible for a visit in 1 week(s).   Contact information: [place] [phone]       Kary Kos, MD. Call in 1 day(s).   Specialty:  Neurosurgery Why:  please call Dr Windy Carina office on Monday for a post hospital follow up appointment for your back pain. Contact information: [address] [place] [address] [phone]            The results of significant diagnostics from this hospitalization (including imaging, microbiology, ancillary and laboratory) are listed below for reference.    Significant Diagnostic Studies: Ct Abdomen Pelvis Wo Contrast  Result Date: 03/13/2018 CLINICAL DATA:  Back pain. Cancer or infection suspected. Dialysis patient. EXAM: CT ABDOMEN AND PELVIS WITHOUT CONTRAST TECHNIQUE: Multidetector CT imaging of the abdomen and pelvis  was performed following the standard protocol without IV contrast. COMPARISON:  Abdominal radiograph 09/02/2015. Renal ultrasound 02/15/2015. No prior CT. FINDINGS: Lower chest: Hypoventilatory atelectasis right greater than left lung base. Normal heart size. There are coronary artery calcifications. Minimal pericardial fluid without significant effusion. Hepatobiliary: Lack of IV contrast and motion artifact limits assessment. Allowing for these limitations, no focal hepatic abnormality. No gallstones, pericholecystic inflammation, or biliary dilatation. Pancreas: Grossly normal. No ductal dilatation or inflammation. Spleen: Upper normal in size spanning 13.7 cm cranial caudal. Adrenals/Urinary Tract: No adrenal nodule. No hydronephrosis. No significant perinephric edema. Dense renovascular calcifications. Urinary bladder is nondistended. Stomach/Bowel: Lack of enteric contrast and paucity of intra-abdominal fat limits bowel assessment. Stomach is partially distended. No bowel obstruction. No evidence of bowel inflammation or wall thickening. Moderate volume of stool throughout the colon with colonic tortuosity. Small volume of stool in the rectum. Normal appendix. Vascular/Lymphatic: Advanced vascular calcifications of the aorta and branches. No bulky adenopathy allowing for limitations. Reproductive: Prostate is unremarkable. Other: No ascites or free air.  No intra-abdominal fluid collection. Musculoskeletal: Degenerative disc disease at L5-S1. Small Schmorl's node superior endplate of L4. There are no acute or suspicious osseous abnormalities. IMPRESSION: 1. No acute findings in the abdomen/pelvis. No evidence of infection or malignancy on noncontrast exam. 2. Large colonic stool burden can be seen with constipation. 3. Mild splenomegaly, nonspecific. 4. Advanced vascular calcifications. Aortic Atherosclerosis (ICD10-I70.0). Electronically Signed   By: Keith Rake M.D.   On: 03/13/2018 04:33   Dg  Orthopantogram  Result Date: 03/15/2018 CLINICAL DATA:  Strep bacteremia. EXAM: ORTHOPANTOGRAM/PANORAMIC COMPARISON:  None. FINDINGS: No obvious dental caries. There is periapical lucency noted around the second right maxillary molar and also the first left maxillary molar. The mandibular teeth appear normal. IMPRESSION: Periapical lucency noted around the upper molars bilaterally. No obvious dental caries. Electronically Signed   By: Marijo Sanes M.D.   On: 03/15/2018 16:55   Mr Lumbar Spine Wo Contrast  Result Date: 03/13/2018 CLINICAL DATA:  Low back pain radiating to both legs with difficulty walking. History of end-stage renal disease on dialysis. EXAM: MRI LUMBAR SPINE WITHOUT CONTRAST TECHNIQUE: Multiplanar, multisequence MR imaging of the lumbar spine was performed. No intravenous contrast was administered. COMPARISON:  CT abdomen and pelvis 03/13/2018 FINDINGS: Segmentation:  Standard. Alignment: Straightening of the normal lumbar lordosis. Trace retrolisthesis of L3 on L4. Vertebrae: Diminished bone marrow signal intensity diffusely which may be related to patient's history of end-stage renal disease and anemia. No suspicious focal osseous lesion or fracture. Small L4 superior endplate Schmorl's node with primarily chronic surrounding degenerative marrow changes. Conus medullaris and cauda equina: Conus extends to the T12-L1 level. Conus and cauda equina appear normal. Paraspinal and other soft tissues: Unremarkable. Disc levels: Disc desiccation from L3-4 to L5-S1. Mild disc space narrowing at L3-4 and L5-S1. L1-2: Negative. L2-3: Minimal disc bulging most notable in the left extraforaminal region. No stenosis. L3-4: Disc bulging, congenitally short pedicles, and slight facet and ligamentum flavum hypertrophy result in borderline lateral recess and borderline neural foraminal stenosis bilaterally without spinal stenosis. L4-5: Disc bulging and mild facet and ligamentum flavum hypertrophy result  in borderline lateral recess and borderline left neural foraminal stenosis without spinal stenosis. A small central annular fissure is noted. L5-S1: Circumferential disc bulging, a left foraminal disc extrusion, and mild facet hypertrophy result in borderline right and severe left neural foraminal stenosis with potential left L5 nerve root impingement. No significant spinal stenosis. IMPRESSION: 1. Disc degeneration most notable at L5-S1 where there is severe left neural foraminal stenosis due to a disc extrusion. 2. Mild disc and facet degeneration at L3-4 and L4-5 without significant stenosis. Electronically Signed   By: Logan Bores M.D.   On: 03/13/2018 07:59    Microbiology: Recent Results (from the past 240 hour(s))  Urine Culture     Status: Abnormal   Collection Time: 03/13/18 12:54 AM  Result Value Ref Range Status   Specimen Description URINE, RANDOM  Final   Special Requests NONE  Final   Culture (A)  Final    <10,000 COLONIES/mL INSIGNIFICANT GROWTH Performed at [facility], [address] [place], [place] [address]    Report Status 03/14/2018 FINAL  Final  Blood culture (routine x 2)     Status: Abnormal   Collection Time: 03/13/18  1:24 AM  Result Value Ref Range Status   Specimen Description BLOOD RIGHT FOREARM  Final   Special Requests   Final    BOTTLES DRAWN AEROBIC AND ANAEROBIC Blood Culture results may not be optimal due to an inadequate volume of blood received in culture bottles   Culture  Setup Time   Final    GRAM POSITIVE COCCI IN BOTH AEROBIC AND ANAEROBIC BOTTLES CRITICAL RESULT CALLED TO, READ BACK BY AND VERIFIED WITHKarsten Ro [facility] 03/13/18 2239 JDW Performed at [facility], [place] [address], [place], [place] [address]    Culture STREPTOCOCCUS GORDONII (A)  Final   Report Status 03/15/2018 FINAL  Final   Organism ID, Bacteria STREPTOCOCCUS GORDONII  Final      Susceptibility   Streptococcus gordonii - MIC*    PENICILLIN 0.25 INTERMEDIATE  Intermediate     CEFTRIAXONE 0.25 SENSITIVE Sensitive     ERYTHROMYCIN 2 RESISTANT Resistant     LEVOFLOXACIN 1 SENSITIVE Sensitive     VANCOMYCIN 0.5 SENSITIVE Sensitive     * STREPTOCOCCUS GORDONII  Blood Culture ID Panel (Reflexed)  Status: Abnormal   Collection Time: 03/13/18  1:24 AM  Result Value Ref Range Status   Enterococcus species NOT DETECTED NOT DETECTED Final   Listeria monocytogenes NOT DETECTED NOT DETECTED Final   Staphylococcus species NOT DETECTED NOT DETECTED Final   Staphylococcus aureus (BCID) NOT DETECTED NOT DETECTED Final   Streptococcus species DETECTED (A) NOT DETECTED Final    Comment: Not Enterococcus species, Streptococcus agalactiae, Streptococcus pyogenes, or Streptococcus pneumoniae. CRITICAL RESULT CALLED TO, READ BACK BY AND VERIFIED WITH: J [facility] 03/13/18 2239 JDW    Streptococcus agalactiae NOT DETECTED NOT DETECTED Final   Streptococcus pneumoniae NOT DETECTED NOT DETECTED Final   Streptococcus pyogenes NOT DETECTED NOT DETECTED Final   Acinetobacter baumannii NOT DETECTED NOT DETECTED Final   Enterobacteriaceae species NOT DETECTED NOT DETECTED Final   Enterobacter cloacae complex NOT DETECTED NOT DETECTED Final   Escherichia coli NOT DETECTED NOT DETECTED Final   Klebsiella oxytoca NOT DETECTED NOT DETECTED Final   Klebsiella pneumoniae NOT DETECTED NOT DETECTED Final   Proteus species NOT DETECTED NOT DETECTED Final   Serratia marcescens NOT DETECTED NOT DETECTED Final   Haemophilus influenzae NOT DETECTED NOT DETECTED Final   Neisseria meningitidis NOT DETECTED NOT DETECTED Final   Pseudomonas aeruginosa NOT DETECTED NOT DETECTED Final   Candida albicans NOT DETECTED NOT DETECTED Final   Candida glabrata NOT DETECTED NOT DETECTED Final   Candida krusei NOT DETECTED NOT DETECTED Final   Candida parapsilosis NOT DETECTED NOT DETECTED Final   Candida tropicalis NOT DETECTED NOT DETECTED Final  Blood culture (routine x 2)      Status: Abnormal   Collection Time: 03/13/18  1:34 AM  Result Value Ref Range Status   Specimen Description BLOOD RIGHT HAND  Final   Special Requests   Final    BOTTLES DRAWN AEROBIC AND ANAEROBIC Blood Culture results may not be optimal due to an inadequate volume of blood received in culture bottles   Culture  Setup Time   Final    IN BOTH AEROBIC AND ANAEROBIC BOTTLES GRAM POSITIVE COCCI CRITICAL RESULT CALLED TO, READ BACK BY AND VERIFIED WITH: J [facility] PHAR[facility] 03/13/18 2239 JDW    Culture STREPTOCOCCUS GORDONII (A)  Final   Report Status 03/15/2018 FINAL  Final  Culture, blood (routine x 2)     Status: None   Collection Time: 03/16/18  9:52 AM  Result Value Ref Range Status   Specimen Description BLOOD BLOOD RIGHT HAND  Final   Special Requests AEROBIC BOTTLE ONLY Blood Culture adequate volume  Final   Culture   Final    NO GROWTH 5 DAYS Performed at [facility], [address], [place], [place] [address]    Report Status 03/21/2018 FINAL  Final  Culture, blood (routine x 2)     Status: None   Collection Time: 03/16/18  9:52 AM  Result Value Ref Range Status   Specimen Description BLOOD BLOOD RIGHT HAND  Final   Special Requests AEROBIC BOTTLE ONLY Blood Culture adequate volume  Final   Culture   Final    NO GROWTH 5 DAYS Performed at [facility], [place] [address], [place], [place] [address]    Report Status 03/21/2018 FINAL  Final  Culture, Urine     Status: None   Collection Time: 03/16/18  5:15 PM  Result Value Ref Range Status   Specimen Description URINE, CLEAN CATCH  Final   Special Requests NONE  Final   Culture  Final    NO GROWTH Performed at [facility], [address] [place], [place] [address]    Report Status 03/17/2018 FINAL  Final     Labs: Basic Metabolic Panel: Recent Labs  Lab 03/14/18 1440  03/17/18 0711 03/18/18 0657 03/19/18 0453 03/20/18 0447 03/20/18 1000 03/21/18 0554  NA 131*   < > 130* 133* 128* 127*  --  130*   K 3.7   < > 3.9 3.8 4.0 4.1  --  3.8  CL 95*   < > 93* 95* 92* 90*  --  95*  CO2 25   < > 24 26 25 27   --  28  GLUCOSE 257*   < > 165* 111* 147* 136*  --  203*  BUN 59*   < > 52* 25* 32* 40*  --  24*  CREATININE 7.40*   < > 7.36* 4.70* 6.06* 7.20*  --  4.92*  CALCIUM 8.2*   < > 8.7* 9.1 9.0 9.0  --  8.7*  PHOS 4.3  --  5.4*  --   --   --  4.8*  --    < > = values in this interval not displayed.   Liver Function Tests: Recent Labs  Lab 03/14/18 1440 03/17/18 0711  ALBUMIN 2.3* 2.4*   No results for input(s): LIPASE, AMYLASE in the last 168 hours. No results for input(s): AMMONIA in the last 168 hours. CBC: Recent Labs  Lab 03/17/18 0711 03/18/18 0657 03/19/18 0453 03/20/18 0447 03/21/18 0554  WBC 6.7 7.0 7.2 7.6 7.2  HGB 8.8* 9.3* 8.3* 8.9* 8.9*  HCT 27.2* 30.2* 26.2* 27.7* 28.6*  MCV 94.1 95.9 94.6 93.9 95.0  PLT 151 170 146* 148* 130*   Cardiac Enzymes: No results for input(s): CKTOTAL, CKMB, CKMBINDEX, TROPONINI in the last 168 hours. BNP: BNP (last 3 results) No results for input(s): BNP in the last 8760 hours.  ProBNP (last 3 results) No results for input(s): PROBNP in the last 8760 hours.  CBG: Recent Labs  Lab 03/19/18 2107 03/20/18 1329 03/20/18 1657 03/20/18 2110 03/21/18 0752  GLUCAP 181* 110* 140* 219* 190*       Signed:  Kayleen Memos, MD [facility] 03/21/2018, 10:54 AM

## 2018-03-23 DIAGNOSIS — R7881 Bacteremia: Secondary | ICD-10-CM | POA: Diagnosis not present

## 2018-03-23 DIAGNOSIS — I10 Essential (primary) hypertension: Secondary | ICD-10-CM | POA: Diagnosis not present

## 2018-03-23 DIAGNOSIS — M545 Low back pain: Secondary | ICD-10-CM | POA: Diagnosis not present

## 2018-03-23 DIAGNOSIS — E113512 Type 2 diabetes mellitus with proliferative diabetic retinopathy with macular edema, left eye: Secondary | ICD-10-CM | POA: Diagnosis not present

## 2018-03-23 DIAGNOSIS — B955 Unspecified streptococcus as the cause of diseases classified elsewhere: Secondary | ICD-10-CM | POA: Diagnosis not present

## 2018-03-23 DIAGNOSIS — R2689 Other abnormalities of gait and mobility: Secondary | ICD-10-CM | POA: Diagnosis not present

## 2018-03-23 DIAGNOSIS — M5440 Lumbago with sciatica, unspecified side: Secondary | ICD-10-CM | POA: Diagnosis not present

## 2018-03-23 DIAGNOSIS — K5900 Constipation, unspecified: Secondary | ICD-10-CM | POA: Diagnosis not present

## 2018-03-24 DIAGNOSIS — D649 Anemia, unspecified: Secondary | ICD-10-CM | POA: Diagnosis not present

## 2018-03-24 DIAGNOSIS — N186 End stage renal disease: Secondary | ICD-10-CM | POA: Diagnosis not present

## 2018-03-24 DIAGNOSIS — N2581 Secondary hyperparathyroidism of renal origin: Secondary | ICD-10-CM | POA: Diagnosis not present

## 2018-03-24 DIAGNOSIS — E1129 Type 2 diabetes mellitus with other diabetic kidney complication: Secondary | ICD-10-CM | POA: Diagnosis not present

## 2018-03-24 DIAGNOSIS — M4807 Spinal stenosis, lumbosacral region: Secondary | ICD-10-CM | POA: Diagnosis not present

## 2018-03-26 DIAGNOSIS — N186 End stage renal disease: Secondary | ICD-10-CM | POA: Diagnosis not present

## 2018-03-26 DIAGNOSIS — D649 Anemia, unspecified: Secondary | ICD-10-CM | POA: Diagnosis not present

## 2018-03-26 DIAGNOSIS — R2689 Other abnormalities of gait and mobility: Secondary | ICD-10-CM | POA: Diagnosis not present

## 2018-03-26 DIAGNOSIS — M4807 Spinal stenosis, lumbosacral region: Secondary | ICD-10-CM | POA: Diagnosis not present

## 2018-03-26 DIAGNOSIS — E1129 Type 2 diabetes mellitus with other diabetic kidney complication: Secondary | ICD-10-CM | POA: Diagnosis not present

## 2018-03-26 DIAGNOSIS — M545 Low back pain: Secondary | ICD-10-CM | POA: Diagnosis not present

## 2018-03-26 DIAGNOSIS — N2581 Secondary hyperparathyroidism of renal origin: Secondary | ICD-10-CM | POA: Diagnosis not present

## 2018-03-28 DIAGNOSIS — N2581 Secondary hyperparathyroidism of renal origin: Secondary | ICD-10-CM | POA: Diagnosis not present

## 2018-03-28 DIAGNOSIS — D649 Anemia, unspecified: Secondary | ICD-10-CM | POA: Diagnosis not present

## 2018-03-28 DIAGNOSIS — M4807 Spinal stenosis, lumbosacral region: Secondary | ICD-10-CM | POA: Diagnosis not present

## 2018-03-28 DIAGNOSIS — N186 End stage renal disease: Secondary | ICD-10-CM | POA: Diagnosis not present

## 2018-03-28 DIAGNOSIS — E1129 Type 2 diabetes mellitus with other diabetic kidney complication: Secondary | ICD-10-CM | POA: Diagnosis not present

## 2018-03-30 DIAGNOSIS — M549 Dorsalgia, unspecified: Secondary | ICD-10-CM | POA: Diagnosis not present

## 2018-03-30 DIAGNOSIS — M545 Low back pain: Secondary | ICD-10-CM | POA: Diagnosis not present

## 2018-03-30 DIAGNOSIS — R2689 Other abnormalities of gait and mobility: Secondary | ICD-10-CM | POA: Diagnosis not present

## 2018-03-31 DIAGNOSIS — N186 End stage renal disease: Secondary | ICD-10-CM | POA: Diagnosis not present

## 2018-03-31 DIAGNOSIS — E1129 Type 2 diabetes mellitus with other diabetic kidney complication: Secondary | ICD-10-CM | POA: Diagnosis not present

## 2018-03-31 DIAGNOSIS — D649 Anemia, unspecified: Secondary | ICD-10-CM | POA: Diagnosis not present

## 2018-03-31 DIAGNOSIS — N2581 Secondary hyperparathyroidism of renal origin: Secondary | ICD-10-CM | POA: Diagnosis not present

## 2018-03-31 DIAGNOSIS — M4807 Spinal stenosis, lumbosacral region: Secondary | ICD-10-CM | POA: Diagnosis not present

## 2018-04-01 DIAGNOSIS — M545 Low back pain: Secondary | ICD-10-CM | POA: Diagnosis not present

## 2018-04-01 DIAGNOSIS — R2689 Other abnormalities of gait and mobility: Secondary | ICD-10-CM | POA: Diagnosis not present

## 2018-04-02 DIAGNOSIS — N186 End stage renal disease: Secondary | ICD-10-CM | POA: Diagnosis not present

## 2018-04-02 DIAGNOSIS — D649 Anemia, unspecified: Secondary | ICD-10-CM | POA: Diagnosis not present

## 2018-04-02 DIAGNOSIS — N2581 Secondary hyperparathyroidism of renal origin: Secondary | ICD-10-CM | POA: Diagnosis not present

## 2018-04-02 DIAGNOSIS — M4807 Spinal stenosis, lumbosacral region: Secondary | ICD-10-CM | POA: Diagnosis not present

## 2018-04-02 DIAGNOSIS — E1129 Type 2 diabetes mellitus with other diabetic kidney complication: Secondary | ICD-10-CM | POA: Diagnosis not present

## 2018-04-03 DIAGNOSIS — I10 Essential (primary) hypertension: Secondary | ICD-10-CM | POA: Diagnosis not present

## 2018-04-03 DIAGNOSIS — M5440 Lumbago with sciatica, unspecified side: Secondary | ICD-10-CM | POA: Diagnosis not present

## 2018-04-03 DIAGNOSIS — E119 Type 2 diabetes mellitus without complications: Secondary | ICD-10-CM | POA: Diagnosis not present

## 2018-04-03 DIAGNOSIS — N186 End stage renal disease: Secondary | ICD-10-CM | POA: Diagnosis not present

## 2018-04-04 DIAGNOSIS — N186 End stage renal disease: Secondary | ICD-10-CM | POA: Diagnosis not present

## 2018-04-04 DIAGNOSIS — E1129 Type 2 diabetes mellitus with other diabetic kidney complication: Secondary | ICD-10-CM | POA: Diagnosis not present

## 2018-04-04 DIAGNOSIS — N2581 Secondary hyperparathyroidism of renal origin: Secondary | ICD-10-CM | POA: Diagnosis not present

## 2018-04-04 DIAGNOSIS — M4807 Spinal stenosis, lumbosacral region: Secondary | ICD-10-CM | POA: Diagnosis not present

## 2018-04-04 DIAGNOSIS — D649 Anemia, unspecified: Secondary | ICD-10-CM | POA: Diagnosis not present

## 2018-04-05 NOTE — Progress Notes (Signed)
Patient: Russell Price  DOB: 1960/06/01 MRN: [id number] PCP: Charlott Rakes, MD  Referring Provider: hospital follow up   Patient Active Problem List   Diagnosis Date Noted  . Polypharmacy 04/07/2018  . Streptococcal bacteremia 03/14/2018  . Acute low back pain 03/13/2018  . Bilateral leg weakness 03/13/2018  . Hyponatremia 11/06/2016  . Fluid overload 11/06/2016  . CAD (coronary artery disease)   . ESRD (end stage renal disease) ([place])   . Diabetic neuropathy ([place]) 10/05/2015  . Left arm weakness   . Hyperlipidemia 09/21/2015  . Labile blood pressure   . Labile blood glucose   . Diabetes mellitus type 2 in nonobese (HCC)   . Anemia of chronic disease   . Debility 09/08/2015  . Seizure-like activity ([place])   . Cerebral thrombosis with cerebral infarction ([place]) 02/18/2015  . Essential hypertension 11/11/2014  . DM (diabetes mellitus) with complications ([place]) 72/62/0355  . Blind right eye 11/11/2014     Subjective:  Russell Price is a 57 y.o. male here for hospital follow up on his strep gordonae bacteremia.   Russell Price has a pmhx of ESRD on TTS, HTN, DMII who was recently treated at [facility] for strepgordonaebacteremia. MRI w/o definitive findings of discitis however concern for early infection was raised and decision to treat for prolonged time frame with 6 weeks of IV cefazolin via HD through 12/08.   Still having some pain in the back specifically with lying to sitting. He has some RLE weakness/buckling but has not had falls at home. Does use a walker most of the time but can walk slowly w/o. He completed a stay at rehab facility and he and his wife report slow improvement in pain and mobility. He is getting his IV cefazolin after dialysis TTS schedule as prescribed. He and his wife have a huge bag of medications and would like some assistance sorting things out as to what they should be taking.   Review of Systems  Constitutional: Negative for chills, fever,  malaise/fatigue and weight loss.  Respiratory: Negative for cough and sputum production.   Cardiovascular: Negative for chest pain and leg swelling.  Gastrointestinal: Negative for abdominal pain, diarrhea and vomiting.  Genitourinary: Negative for dysuria and flank pain.  Musculoskeletal: Negative for joint pain, myalgias and neck pain.  Skin: Negative for rash.  Neurological: Negative for dizziness, tingling and headaches.  Psychiatric/Behavioral: Negative for depression and substance abuse. The patient is not nervous/anxious and does not have insomnia.     Past Medical History:  Diagnosis Date  . Back pain 03/12/2018  . CAD (coronary artery disease)    a. underwent cath in 02/2016 after an abnormal nuc which showed 2V CAD in RCA and OM3 with no good targets for PCI. Medical managment recommended.   . Chronic kidney disease   . Diabetes mellitus without complication ([place])    type II - followed by Dr Chalmers Cater   . Difficult intubation    ER intubation 2016  . ESRD (end stage renal disease) ([place])    Tues, Th, Sat [place]  . Fever 03/12/2018  . Glaucoma   . Hemodialysis patient [facility])    has been on dialysis since approx 10/2016   . Hypertension    followed by [facility]  . Stroke [facility])    patient denies on 02/19/2017     Outpatient Medications Prior to Visit  Medication Sig Dispense Refill  . amLODipine (NORVASC) 10 MG tablet Take 1 tablet (10 mg  total) by mouth daily. 30 tablet 0  . aspirin 81 MG chewable tablet Chew 1 tablet (81 mg total) by mouth daily. (Patient taking differently: Chew 81 mg by mouth every evening. )    . atorvastatin (LIPITOR) 40 MG tablet Take 1 tablet (40 mg total) by mouth daily at 6 PM. 30 tablet 5  . b complex-vitamin c-folic acid (NEPHRO-VITE) 0.8 MG TABS tablet Take 1 tablet by mouth at bedtime.    . calcitRIOL (ROCALTROL) 0.25 MCG capsule Take 1 capsule (0.25 mcg total) by mouth daily. 30 capsule 6  . carvedilol (COREG) 6.25 MG  tablet TAKE 2 TABLETS BY MOUTH 2 TIMES DAILY WITH A MEAL. (Patient taking differently: Take 12.5 mg by mouth 2 (two) times daily. ) 60 tablet 3  . ceFAZolin (ANCEF) 2-4 GM/100ML-% IVPB Inject 100 mLs (2 g total) into the vein every Tuesday, Thursday, and Saturday at 6 PM. 1 each 0  . cyclobenzaprine (FLEXERIL) 10 MG tablet Take 1 tablet (10 mg total) by mouth 3 (three) times daily as needed for up to 30 doses for muscle spasms. 30 tablet 0  . furosemide (LASIX) 80 MG tablet TAKE 1 TABLET BY MOUTH 2 TIMES DAILY. (Patient taking differently: Take 80 mg by mouth daily. ) 60 tablet 2  . gabapentin (NEURONTIN) 300 MG capsule Take 1 capsule (300 mg total) by mouth daily. 30 capsule 5  . ondansetron (ZOFRAN) 4 MG tablet Take 1 tablet (4 mg total) by mouth every 8 (eight) hours as needed for nausea or vomiting. 30 tablet 1  . RENAGEL 800 MG tablet Take 800-1,600 mg by mouth See admin instructions. Take 1600 mg by mouth 3 times daily with meals and take 800 mg by mouth twice daily with snacks    . ACCU-CHEK SOFTCLIX LANCETS lancets Use as instructed 3 times daily before meals and at bedtime. E11.9 100 each 12  . Amino Acids-Protein Hydrolys (FEEDING SUPPLEMENT, PRO-STAT SUGAR FREE 64,) LIQD Take 30 mLs by mouth 2 (two) times daily. 900 mL 0  . bisacodyl (DULCOLAX) 10 MG suppository Place 1 suppository (10 mg total) rectally daily as needed for moderate constipation. 12 suppository 0  . Blood Glucose Monitoring Suppl (ACCU-CHEK AVIVA) device Use as instructed 3 times daily before meals and at bedtime. E11.9 1 each 0  . Blood Glucose Monitoring Suppl (TRUE METRIX METER) DEVI 1 each by Does not apply route 3 (three) times daily. 1 Device 0  . Darbepoetin Alfa (ARANESP) 150 MCG/0.3ML SOSY injection Inject 0.3 mLs (150 mcg total) into the vein every Saturday with hemodialysis. (Patient not taking: Reported on 12/15/2017) 1.68 mL 0  . glucose blood (ACCU-CHEK AVIVA PLUS) test strip Use 3 times daily before meals and at  bedtime. E11.9 100 each 12  . hydrocortisone (ANUSOL-HC) 2.5 % rectal cream Place rectally 2 (two) times daily. (Patient not taking: Reported on 12/15/2017) 30 g 0  . insulin aspart (NOVOLOG) 100 UNIT/ML injection 0-12 units 3 times daily before meals as well as per sliding scale 10 mL 5  . insulin glargine (LANTUS) 100 UNIT/ML injection Inject subcutaneously 10 units of Lantus in the morning and 15 units of Lantus in the evening (Patient taking differently: Inject 10-12 Units into the skin 2 (two) times daily. Inject subcutaneously 10 units of Lantus in the morning and 12 units of Lantus in the evening) 30 mL 5  . Lancet Devices (ACCU-CHEK SOFTCLIX) lancets Use as instructed 3 times daily before meals and at bedtime. E11.9 1 each 12  .  Nutritional Supplements (FEEDING SUPPLEMENT, NEPRO CARB STEADY,) LIQD Take 237 mLs by mouth 2 (two) times daily between meals. 60 Can 0  . polyethylene glycol (MIRALAX / GLYCOLAX) packet Take 17 g by mouth daily. 14 each 0  . senna-docusate (SENOKOT-S) 8.6-50 MG tablet Take 1 tablet by mouth at bedtime. 30 tablet 0  . timolol (TIMOPTIC) 0.5 % ophthalmic solution Place 1 drop into the left eye 2 (two) times daily.     No facility-administered medications prior to visit.      No Known Allergies  Social History   Tobacco Use  . Smoking status: Former Smoker    Packs/day: 0.00    Years: 4.00    Pack years: 0.00    Last attempt to quit: 02/23/1983    Years since quitting: 35.1  . Smokeless tobacco: Never Used  Substance Use Topics  . Alcohol use: No  . Drug use: No    Objective:   Vitals:   04/06/18 1531  Weight: 152 lb (68.9 kg)   Body mass index is 21.2 kg/m.  Physical Exam  Constitutional: He is oriented to person, place, and time. He appears well-developed and well-nourished.  Seated in chair during visit. Appears uncomfortable. Wife present.   HENT:  Mouth/Throat: Oropharynx is clear and moist and mucous membranes are normal. Normal dentition.  No dental abscesses.  Cardiovascular: Normal rate, regular rhythm and normal heart sounds.  Pulmonary/Chest: Effort normal and breath sounds normal.  Abdominal: Soft. He exhibits no distension. There is no tenderness.  Musculoskeletal:  No bony tenderness down spine with palpation. No deformity. Slow sit to stand. Shuffled gait.   Lymphadenopathy:    He has no cervical adenopathy.  Neurological: He is alert and oriented to person, place, and time.  Skin: Skin is warm and dry. No rash noted.  Psychiatric: He has a normal mood and affect. Judgment normal.    Lab Results: Lab Results  Component Value Date   WBC 6.5 04/06/2018   HGB 10.1 (L) 04/06/2018   HCT 30.7 (L) 04/06/2018   MCV 92.7 04/06/2018   PLT 224 04/06/2018    Lab Results  Component Value Date   CREATININE 6.11 (H) 04/06/2018   BUN 40 (H) 04/06/2018   NA 134 (L) 04/06/2018   K 4.1 04/06/2018   CL 97 (L) 04/06/2018   CO2 26 04/06/2018    Lab Results  Component Value Date   ALT <3 (L) 04/06/2018   AST 17 04/06/2018   ALKPHOS 113 07/21/2017   BILITOT 0.3 04/06/2018     Assessment & Plan:   Problem List Items Addressed This Visit      Unprioritized   Acute low back pain    Mr. Venables has demonstrated some improvement per his and his wife's report today after treating presumptive infection and completion of rehab program. Still demonstrates lower extremity weakness R>L on my exam. I have concerns about him falling. Fall precautions and home environmental survey discussed to reduce mechanical falls. Reviewed medications that are intended for as needed relief of pain (flexeril, gabapentin). Encouraged slow progress at home but continuation of exercises as he can tolerate and walking.       Polypharmacy    He is at significant risk of effects of polypharmacy with multiple duplicate drug regimens and uncertainty as to what med is to be taken for what reason. Spent 30 minutes reviewing medications to discharge  summary, re-writing labels and ensuring duplicated bottles of medications were removed and labeled appropriately. Suggested 3-day  dose pill box.       Streptococcal bacteremia - Primary    Secondary to presumed discitis (acute lower back pain, fever and bacteremia). He is now on day 21/42 of therapy and improving slowly. No signs of fevers or recurrent bacteremia. Continue IV cefazolin through 04/26/18. Called HD center to verify plan and that he is getting antibiotics.        Other Visit Diagnoses    Medication monitoring encounter       Relevant Orders   CBC (Completed)   C-reactive protein (Completed)   Sedimentation rate (Completed)   Comp Met (CMET) (Completed)     Return in about 3 weeks (around 04/27/2018).   Janene Madeira, MSN, NP-C [facility] [place] Pager: [phone] Office: [phone]  04/07/18  9:30 PM

## 2018-04-06 ENCOUNTER — Ambulatory Visit (INDEPENDENT_AMBULATORY_CARE_PROVIDER_SITE_OTHER): Payer: Medicare Other | Admitting: Infectious Diseases

## 2018-04-06 ENCOUNTER — Telehealth: Payer: Self-pay | Admitting: Family Medicine

## 2018-04-06 VITALS — Wt 152.0 lb

## 2018-04-06 DIAGNOSIS — M5440 Lumbago with sciatica, unspecified side: Secondary | ICD-10-CM | POA: Diagnosis not present

## 2018-04-06 DIAGNOSIS — R7881 Bacteremia: Secondary | ICD-10-CM

## 2018-04-06 DIAGNOSIS — Z5181 Encounter for therapeutic drug level monitoring: Secondary | ICD-10-CM

## 2018-04-06 DIAGNOSIS — Z79899 Other long term (current) drug therapy: Secondary | ICD-10-CM

## 2018-04-06 DIAGNOSIS — B955 Unspecified streptococcus as the cause of diseases classified elsewhere: Secondary | ICD-10-CM | POA: Diagnosis not present

## 2018-04-06 NOTE — Telephone Encounter (Signed)
liji with [facility] called to get a new order to evaluate for home health visits on 11/20. Please follow up.

## 2018-04-06 NOTE — Patient Instructions (Addendum)
Wonderful to see you are home now and slowly making some improvements.   Only take the medications as we discussed in the grocery bags.   [facility] I think has medication trays with 3 times a day dosing.   Please come back in 3-4 weeks with Dr. Linus Salmons, [place] or [place].

## 2018-04-06 NOTE — Telephone Encounter (Signed)
Russell Price was called and given verbal orders for home health.

## 2018-04-07 DIAGNOSIS — Z79899 Other long term (current) drug therapy: Secondary | ICD-10-CM | POA: Insufficient documentation

## 2018-04-07 DIAGNOSIS — M4807 Spinal stenosis, lumbosacral region: Secondary | ICD-10-CM | POA: Diagnosis not present

## 2018-04-07 DIAGNOSIS — E1129 Type 2 diabetes mellitus with other diabetic kidney complication: Secondary | ICD-10-CM | POA: Diagnosis not present

## 2018-04-07 DIAGNOSIS — N186 End stage renal disease: Secondary | ICD-10-CM | POA: Diagnosis not present

## 2018-04-07 DIAGNOSIS — N2581 Secondary hyperparathyroidism of renal origin: Secondary | ICD-10-CM | POA: Diagnosis not present

## 2018-04-07 DIAGNOSIS — D649 Anemia, unspecified: Secondary | ICD-10-CM | POA: Diagnosis not present

## 2018-04-07 LAB — CBC
HCT: 30.7 % — ABNORMAL LOW (ref 38.5–50.0)
Hemoglobin: 10.1 g/dL — ABNORMAL LOW (ref 13.2–17.1)
MCH: 30.5 pg (ref 27.0–33.0)
MCHC: 32.9 g/dL (ref 32.0–36.0)
MCV: 92.7 fL (ref 80.0–100.0)
MPV: 11.7 fL (ref 7.5–12.5)
PLATELETS: 224 10*3/uL (ref 140–400)
RBC: 3.31 10*6/uL — AB (ref 4.20–5.80)
RDW: 13.5 % (ref 11.0–15.0)
WBC: 6.5 10*3/uL (ref 3.8–10.8)

## 2018-04-07 LAB — COMPREHENSIVE METABOLIC PANEL
AG Ratio: 0.9 (calc) — ABNORMAL LOW (ref 1.0–2.5)
ALT: <3 U/L — ABNORMAL LOW (ref 9–46)
AST: 17 U/L (ref 10–35)
Albumin: 3.9 g/dL (ref 3.6–5.1)
Alkaline phosphatase (APISO): 132 U/L — ABNORMAL HIGH (ref 40–115)
BUN/Creatinine Ratio: 7 (calc) (ref 6–22)
BUN: 40 mg/dL — ABNORMAL HIGH (ref 7–25)
CO2: 26 mmol/L (ref 20–32)
Calcium: 10.2 mg/dL (ref 8.6–10.3)
Chloride: 97 mmol/L — ABNORMAL LOW (ref 98–110)
Creat: 6.11 mg/dL — ABNORMAL HIGH (ref 0.70–1.33)
Globulin: 4.2 g/dL (calc) — ABNORMAL HIGH (ref 1.9–3.7)
Glucose, Bld: 147 mg/dL — ABNORMAL HIGH (ref 65–99)
Potassium: 4.1 mmol/L (ref 3.5–5.3)
Sodium: 134 mmol/L — ABNORMAL LOW (ref 135–146)
Total Bilirubin: 0.3 mg/dL (ref 0.2–1.2)
Total Protein: 8.1 g/dL (ref 6.1–8.1)

## 2018-04-07 LAB — SEDIMENTATION RATE: Sed Rate: 94 mm/h — ABNORMAL HIGH (ref 0–20)

## 2018-04-07 LAB — C-REACTIVE PROTEIN: CRP: 28.2 mg/L — AB (ref <8.0)

## 2018-04-07 NOTE — Assessment & Plan Note (Signed)
Mr. Gasior has demonstrated some improvement per his and his wife's report today after treating presumptive infection and completion of rehab program. Still demonstrates lower extremity weakness R>L on my exam. I have concerns about him falling. Fall precautions and home environmental survey discussed to reduce mechanical falls. Reviewed medications that are intended for as needed relief of pain (flexeril, gabapentin). Encouraged slow progress at home but continuation of exercises as he can tolerate and walking.

## 2018-04-07 NOTE — Assessment & Plan Note (Signed)
Secondary to presumed discitis (acute lower back pain, fever and bacteremia). He is now on day 21/42 of therapy and improving slowly. No signs of fevers or recurrent bacteremia. Continue IV cefazolin through 04/26/18. Called HD center to verify plan and that he is getting antibiotics.

## 2018-04-07 NOTE — Progress Notes (Signed)
Inflammatory markers remain elevated in ESRD patient - may be confounding. Continue cefazolin with HD. Follow up at end of therapy to consider stopping vs oral therapy.

## 2018-04-07 NOTE — Assessment & Plan Note (Signed)
He is at significant risk of effects of polypharmacy with multiple duplicate drug regimens and uncertainty as to what med is to be taken for what reason. Spent 30 minutes reviewing medications to discharge summary, re-writing labels and ensuring duplicated bottles of medications were removed and labeled appropriately. Suggested 3-day dose pill box.

## 2018-04-08 DIAGNOSIS — M48061 Spinal stenosis, lumbar region without neurogenic claudication: Secondary | ICD-10-CM | POA: Diagnosis not present

## 2018-04-08 DIAGNOSIS — E1140 Type 2 diabetes mellitus with diabetic neuropathy, unspecified: Secondary | ICD-10-CM | POA: Diagnosis not present

## 2018-04-08 DIAGNOSIS — Z8673 Personal history of transient ischemic attack (TIA), and cerebral infarction without residual deficits: Secondary | ICD-10-CM | POA: Diagnosis not present

## 2018-04-08 DIAGNOSIS — D631 Anemia in chronic kidney disease: Secondary | ICD-10-CM | POA: Diagnosis not present

## 2018-04-08 DIAGNOSIS — I2510 Atherosclerotic heart disease of native coronary artery without angina pectoris: Secondary | ICD-10-CM | POA: Diagnosis not present

## 2018-04-08 DIAGNOSIS — M5440 Lumbago with sciatica, unspecified side: Secondary | ICD-10-CM | POA: Diagnosis not present

## 2018-04-08 DIAGNOSIS — I120 Hypertensive chronic kidney disease with stage 5 chronic kidney disease or end stage renal disease: Secondary | ICD-10-CM | POA: Diagnosis not present

## 2018-04-08 DIAGNOSIS — N186 End stage renal disease: Secondary | ICD-10-CM | POA: Diagnosis not present

## 2018-04-08 DIAGNOSIS — Z794 Long term (current) use of insulin: Secondary | ICD-10-CM | POA: Diagnosis not present

## 2018-04-08 DIAGNOSIS — Z992 Dependence on renal dialysis: Secondary | ICD-10-CM | POA: Diagnosis not present

## 2018-04-08 NOTE — Telephone Encounter (Signed)
Sharyn Lull was called and given verbal orders.

## 2018-04-08 NOTE — Telephone Encounter (Signed)
Russell Price form [facility] called requesting verbal orders for OT 2X a week for 4 weeks and 1X a week for 1 week. Please f/u

## 2018-04-09 DIAGNOSIS — M4807 Spinal stenosis, lumbosacral region: Secondary | ICD-10-CM | POA: Diagnosis not present

## 2018-04-09 DIAGNOSIS — N2581 Secondary hyperparathyroidism of renal origin: Secondary | ICD-10-CM | POA: Diagnosis not present

## 2018-04-09 DIAGNOSIS — E1129 Type 2 diabetes mellitus with other diabetic kidney complication: Secondary | ICD-10-CM | POA: Diagnosis not present

## 2018-04-09 DIAGNOSIS — N186 End stage renal disease: Secondary | ICD-10-CM | POA: Diagnosis not present

## 2018-04-09 DIAGNOSIS — D649 Anemia, unspecified: Secondary | ICD-10-CM | POA: Diagnosis not present

## 2018-04-10 ENCOUNTER — Ambulatory Visit: Payer: Medicare Other | Admitting: Infectious Diseases

## 2018-04-10 DIAGNOSIS — E1140 Type 2 diabetes mellitus with diabetic neuropathy, unspecified: Secondary | ICD-10-CM | POA: Diagnosis not present

## 2018-04-10 DIAGNOSIS — I120 Hypertensive chronic kidney disease with stage 5 chronic kidney disease or end stage renal disease: Secondary | ICD-10-CM | POA: Diagnosis not present

## 2018-04-10 DIAGNOSIS — N186 End stage renal disease: Secondary | ICD-10-CM | POA: Diagnosis not present

## 2018-04-10 DIAGNOSIS — M5440 Lumbago with sciatica, unspecified side: Secondary | ICD-10-CM | POA: Diagnosis not present

## 2018-04-10 DIAGNOSIS — M48061 Spinal stenosis, lumbar region without neurogenic claudication: Secondary | ICD-10-CM | POA: Diagnosis not present

## 2018-04-10 DIAGNOSIS — I2510 Atherosclerotic heart disease of native coronary artery without angina pectoris: Secondary | ICD-10-CM | POA: Diagnosis not present

## 2018-04-11 DIAGNOSIS — N2581 Secondary hyperparathyroidism of renal origin: Secondary | ICD-10-CM | POA: Diagnosis not present

## 2018-04-11 DIAGNOSIS — N186 End stage renal disease: Secondary | ICD-10-CM | POA: Diagnosis not present

## 2018-04-11 DIAGNOSIS — M4807 Spinal stenosis, lumbosacral region: Secondary | ICD-10-CM | POA: Diagnosis not present

## 2018-04-11 DIAGNOSIS — D649 Anemia, unspecified: Secondary | ICD-10-CM | POA: Diagnosis not present

## 2018-04-11 DIAGNOSIS — E1129 Type 2 diabetes mellitus with other diabetic kidney complication: Secondary | ICD-10-CM | POA: Diagnosis not present

## 2018-04-13 DIAGNOSIS — D649 Anemia, unspecified: Secondary | ICD-10-CM | POA: Diagnosis not present

## 2018-04-13 DIAGNOSIS — E1129 Type 2 diabetes mellitus with other diabetic kidney complication: Secondary | ICD-10-CM | POA: Diagnosis not present

## 2018-04-13 DIAGNOSIS — N2581 Secondary hyperparathyroidism of renal origin: Secondary | ICD-10-CM | POA: Diagnosis not present

## 2018-04-13 DIAGNOSIS — M4807 Spinal stenosis, lumbosacral region: Secondary | ICD-10-CM | POA: Diagnosis not present

## 2018-04-13 DIAGNOSIS — N186 End stage renal disease: Secondary | ICD-10-CM | POA: Diagnosis not present

## 2018-04-14 DIAGNOSIS — N186 End stage renal disease: Secondary | ICD-10-CM | POA: Diagnosis not present

## 2018-04-14 DIAGNOSIS — I120 Hypertensive chronic kidney disease with stage 5 chronic kidney disease or end stage renal disease: Secondary | ICD-10-CM | POA: Diagnosis not present

## 2018-04-14 DIAGNOSIS — M5440 Lumbago with sciatica, unspecified side: Secondary | ICD-10-CM | POA: Diagnosis not present

## 2018-04-14 DIAGNOSIS — E1140 Type 2 diabetes mellitus with diabetic neuropathy, unspecified: Secondary | ICD-10-CM | POA: Diagnosis not present

## 2018-04-14 DIAGNOSIS — M48061 Spinal stenosis, lumbar region without neurogenic claudication: Secondary | ICD-10-CM | POA: Diagnosis not present

## 2018-04-14 DIAGNOSIS — I2510 Atherosclerotic heart disease of native coronary artery without angina pectoris: Secondary | ICD-10-CM | POA: Diagnosis not present

## 2018-04-15 DIAGNOSIS — I2510 Atherosclerotic heart disease of native coronary artery without angina pectoris: Secondary | ICD-10-CM | POA: Diagnosis not present

## 2018-04-15 DIAGNOSIS — M48061 Spinal stenosis, lumbar region without neurogenic claudication: Secondary | ICD-10-CM | POA: Diagnosis not present

## 2018-04-15 DIAGNOSIS — E1140 Type 2 diabetes mellitus with diabetic neuropathy, unspecified: Secondary | ICD-10-CM | POA: Diagnosis not present

## 2018-04-15 DIAGNOSIS — M4807 Spinal stenosis, lumbosacral region: Secondary | ICD-10-CM | POA: Diagnosis not present

## 2018-04-15 DIAGNOSIS — M5440 Lumbago with sciatica, unspecified side: Secondary | ICD-10-CM | POA: Diagnosis not present

## 2018-04-15 DIAGNOSIS — N2581 Secondary hyperparathyroidism of renal origin: Secondary | ICD-10-CM | POA: Diagnosis not present

## 2018-04-15 DIAGNOSIS — N186 End stage renal disease: Secondary | ICD-10-CM | POA: Diagnosis not present

## 2018-04-15 DIAGNOSIS — I120 Hypertensive chronic kidney disease with stage 5 chronic kidney disease or end stage renal disease: Secondary | ICD-10-CM | POA: Diagnosis not present

## 2018-04-15 DIAGNOSIS — E1129 Type 2 diabetes mellitus with other diabetic kidney complication: Secondary | ICD-10-CM | POA: Diagnosis not present

## 2018-04-15 DIAGNOSIS — D649 Anemia, unspecified: Secondary | ICD-10-CM | POA: Diagnosis not present

## 2018-04-17 DIAGNOSIS — I2510 Atherosclerotic heart disease of native coronary artery without angina pectoris: Secondary | ICD-10-CM | POA: Diagnosis not present

## 2018-04-17 DIAGNOSIS — M5440 Lumbago with sciatica, unspecified side: Secondary | ICD-10-CM | POA: Diagnosis not present

## 2018-04-17 DIAGNOSIS — M48061 Spinal stenosis, lumbar region without neurogenic claudication: Secondary | ICD-10-CM | POA: Diagnosis not present

## 2018-04-17 DIAGNOSIS — E1140 Type 2 diabetes mellitus with diabetic neuropathy, unspecified: Secondary | ICD-10-CM | POA: Diagnosis not present

## 2018-04-17 DIAGNOSIS — I120 Hypertensive chronic kidney disease with stage 5 chronic kidney disease or end stage renal disease: Secondary | ICD-10-CM | POA: Diagnosis not present

## 2018-04-17 DIAGNOSIS — N186 End stage renal disease: Secondary | ICD-10-CM | POA: Diagnosis not present

## 2018-04-18 DIAGNOSIS — D649 Anemia, unspecified: Secondary | ICD-10-CM | POA: Diagnosis not present

## 2018-04-18 DIAGNOSIS — N186 End stage renal disease: Secondary | ICD-10-CM | POA: Diagnosis not present

## 2018-04-18 DIAGNOSIS — E1129 Type 2 diabetes mellitus with other diabetic kidney complication: Secondary | ICD-10-CM | POA: Diagnosis not present

## 2018-04-18 DIAGNOSIS — N2581 Secondary hyperparathyroidism of renal origin: Secondary | ICD-10-CM | POA: Diagnosis not present

## 2018-04-18 DIAGNOSIS — M4807 Spinal stenosis, lumbosacral region: Secondary | ICD-10-CM | POA: Diagnosis not present

## 2018-04-19 DIAGNOSIS — N186 End stage renal disease: Secondary | ICD-10-CM | POA: Diagnosis not present

## 2018-04-19 DIAGNOSIS — Z992 Dependence on renal dialysis: Secondary | ICD-10-CM | POA: Diagnosis not present

## 2018-04-19 DIAGNOSIS — E1122 Type 2 diabetes mellitus with diabetic chronic kidney disease: Secondary | ICD-10-CM | POA: Diagnosis not present

## 2018-04-20 ENCOUNTER — Ambulatory Visit: Payer: Medicare Other | Attending: Family Medicine | Admitting: Family Medicine

## 2018-04-20 ENCOUNTER — Encounter: Payer: Self-pay | Admitting: Family Medicine

## 2018-04-20 VITALS — BP 182/75 | HR 76 | Temp 97.6°F | Ht 71.0 in | Wt 154.6 lb

## 2018-04-20 DIAGNOSIS — R399 Unspecified symptoms and signs involving the genitourinary system: Secondary | ICD-10-CM | POA: Diagnosis not present

## 2018-04-20 DIAGNOSIS — Z992 Dependence on renal dialysis: Secondary | ICD-10-CM | POA: Diagnosis not present

## 2018-04-20 DIAGNOSIS — B955 Unspecified streptococcus as the cause of diseases classified elsewhere: Secondary | ICD-10-CM | POA: Diagnosis not present

## 2018-04-20 DIAGNOSIS — Z79899 Other long term (current) drug therapy: Secondary | ICD-10-CM | POA: Insufficient documentation

## 2018-04-20 DIAGNOSIS — Z794 Long term (current) use of insulin: Secondary | ICD-10-CM | POA: Diagnosis not present

## 2018-04-20 DIAGNOSIS — M48061 Spinal stenosis, lumbar region without neurogenic claudication: Secondary | ICD-10-CM | POA: Diagnosis not present

## 2018-04-20 DIAGNOSIS — R7881 Bacteremia: Secondary | ICD-10-CM | POA: Diagnosis not present

## 2018-04-20 DIAGNOSIS — I2510 Atherosclerotic heart disease of native coronary artery without angina pectoris: Secondary | ICD-10-CM | POA: Diagnosis not present

## 2018-04-20 DIAGNOSIS — K5909 Other constipation: Secondary | ICD-10-CM | POA: Insufficient documentation

## 2018-04-20 DIAGNOSIS — I120 Hypertensive chronic kidney disease with stage 5 chronic kidney disease or end stage renal disease: Secondary | ICD-10-CM | POA: Diagnosis not present

## 2018-04-20 DIAGNOSIS — E1122 Type 2 diabetes mellitus with diabetic chronic kidney disease: Secondary | ICD-10-CM | POA: Diagnosis not present

## 2018-04-20 DIAGNOSIS — M4646 Discitis, unspecified, lumbar region: Secondary | ICD-10-CM | POA: Diagnosis not present

## 2018-04-20 DIAGNOSIS — I10 Essential (primary) hypertension: Secondary | ICD-10-CM | POA: Diagnosis not present

## 2018-04-20 DIAGNOSIS — Z8673 Personal history of transient ischemic attack (TIA), and cerebral infarction without residual deficits: Secondary | ICD-10-CM | POA: Diagnosis not present

## 2018-04-20 DIAGNOSIS — Z7982 Long term (current) use of aspirin: Secondary | ICD-10-CM | POA: Insufficient documentation

## 2018-04-20 DIAGNOSIS — Z9889 Other specified postprocedural states: Secondary | ICD-10-CM | POA: Diagnosis not present

## 2018-04-20 DIAGNOSIS — E118 Type 2 diabetes mellitus with unspecified complications: Secondary | ICD-10-CM

## 2018-04-20 DIAGNOSIS — N186 End stage renal disease: Secondary | ICD-10-CM

## 2018-04-20 DIAGNOSIS — M5136 Other intervertebral disc degeneration, lumbar region: Secondary | ICD-10-CM | POA: Insufficient documentation

## 2018-04-20 DIAGNOSIS — M5440 Lumbago with sciatica, unspecified side: Secondary | ICD-10-CM | POA: Diagnosis not present

## 2018-04-20 DIAGNOSIS — E1140 Type 2 diabetes mellitus with diabetic neuropathy, unspecified: Secondary | ICD-10-CM | POA: Diagnosis not present

## 2018-04-20 LAB — GLUCOSE, POCT (MANUAL RESULT ENTRY): POC Glucose: 155 mg/dl — AB (ref 70–99)

## 2018-04-20 MED ORDER — LINACLOTIDE 72 MCG PO CAPS: 72.0000 ug | ORAL_CAPSULE | Freq: Every day | ORAL | 1 refills | Status: DC

## 2018-04-20 MED ORDER — TAMSULOSIN HCL 0.4 MG PO CAPS: 0.4000 mg | ORAL_CAPSULE | Freq: Every day | ORAL | 3 refills | Status: DC

## 2018-04-20 MED ORDER — MISC. DEVICES MISC: 0 refills | Status: AC

## 2018-04-20 MED ORDER — TRAMADOL HCL 50 MG PO TABS: 50.0000 mg | ORAL_TABLET | Freq: Two times a day (BID) | ORAL | 0 refills | Status: DC | PRN

## 2018-04-20 MED ORDER — POLYETHYLENE GLYCOL 3350 17 G PO PACK: 17.0000 g | PACK | Freq: Every day | ORAL | 1 refills | Status: DC

## 2018-04-20 MED ORDER — INSULIN ASPART 100 UNIT/ML ~~LOC~~ SOLN: SUBCUTANEOUS | 5 refills | Status: DC

## 2018-04-20 MED ORDER — INSULIN GLARGINE 100 UNIT/ML ~~LOC~~ SOLN: SUBCUTANEOUS | 6 refills | Status: DC

## 2018-04-20 MED FILL — LANTUS 100 UNITS/ML VIAL: 100 | 31 days supply | Qty: 30 | Fill #0

## 2018-04-20 MED FILL — LINZESS 72 MCG CAPSULE: 72 | 30 days supply | Qty: 30 | Fill #0

## 2018-04-20 MED FILL — TAMSULOSIN HCL 0.4 MG CAP: 0.4 | 30 days supply | Qty: 30 | Fill #0

## 2018-04-20 MED FILL — traMADol HCL 50 MG TABS: 50 | 7 days supply | Qty: 14 | Fill #0

## 2018-04-20 MED FILL — NovoLOG 100 UNIT/ML SOLN: 100 | 27 days supply | Qty: 10 | Fill #0

## 2018-04-20 NOTE — Progress Notes (Signed)
Subjective:  Patient ID: Russell [facility], male    DOB: [birth year]  Age: 57 y.o. MRN: [id number]  CC: Diabetes and Back Pain   HPI Russell Price  is a 57 year old male with a history of hypertension, type 2 diabetes mellitus (A1c 7.5), diabetic neuropathy, blind in the right eye, end-stage renal disease (currently on hemodialysis on Tuesdays, Thursdays and Saturdays), recent hospitalization for acute low back pain, streptococcal bacteremia who presents today for follow-up visit. He was hospitalized from 03/12/2018 through 03/21/2018 after he had presented with low back pain, bilateral lower extremity weakness, MRI revealed findings below: IMPRESSION: 1. Disc degeneration most notable at L5-S1 where there is severe left neural foraminal stenosis due to a disc extrusion. 2. Mild disc and facet degeneration at L3-4 and L4-5 without significant stenosis.  Blood culture revealed streptococcal bacteremia, TEE was negative for endocarditis.  He was commenced on IV cefazolin and PT commenced.  He is currently undergoing PT at home but complains of severe low back pain and has run out of his tramadol.  He received cefazolin with his dialysis sessions and has been to see infectious disease for follow-up. He complains of constipation despite using MiraLAX and also complains of having to strain with difficulty in urination. Doing well on his Lantus and denies hypoglycemia. Tolerating dialysis sessions okay. His blood pressure is elevated and he is yet to take his antihypertensive today.  Past Medical History:  Diagnosis Date  . Back pain 03/12/2018  . CAD (coronary artery disease)    a. underwent cath in 02/2016 after an abnormal nuc which showed 2V CAD in RCA and OM3 with no good targets for PCI. Medical managment recommended.   . Chronic kidney disease   . Diabetes mellitus without complication (Russell Price)    type II - followed by Dr Russell Price   . Difficult intubation    ER intubation 2016  . ESRD  (end stage renal disease) ([place])    Tues, Th, Sat [place]  . Fever 03/12/2018  . Glaucoma   . Hemodialysis patient Russell [facility])    has been on dialysis since approx 10/2016   . Hypertension    followed by Russell Price  . Stroke Russell Price)    patient denies on 02/19/2017     Past Surgical History:  Procedure Laterality Date  . A/V FISTULAGRAM Left 11/27/2016   Procedure: A/V Fistulagram;  Surgeon: Russell Sandy, MD;  [facility]: [facility];  Service: Cardiovascular;  Laterality: Left;  . A/V FISTULAGRAM Left 05/07/2017   Procedure: A/V FISTULAGRAM;  Surgeon: Russell Sandy, MD;  Location: [facility];  Service: Cardiovascular;  Laterality: Left;  . A/V FISTULAGRAM Left 07/14/2017   Procedure: A/V FISTULAGRAM;  Surgeon: Russell Sandy, MD;  Location: [facility];  Service: Cardiovascular;  Laterality: Left;  . AV FISTULA PLACEMENT Left 03/12/2016   Procedure: Left Arm ARTERIOVENOUS (AV) FISTULA CREATION;  Surgeon: Russell Sandy, MD;  Location: [facility];  Service: Vascular;  Laterality: Left;  . AV FISTULA PLACEMENT Left 07/18/2017   Procedure: INSERTION OF ARTERIOVENOUS (AV) GORE-TEX GRAFT ARM LEFT UPPER ARM;  Surgeon: Russell Calzada, MD;  Location: [facility];  Service: Vascular;  Laterality: Left;  . BASCILIC VEIN TRANSPOSITION Left 05/14/2016   Procedure: SECOND STAGE LEFT BASILIC VEIN TRANSPOSITION;  Surgeon: Russell Sandy, MD;  Location: [facility];  Service: Vascular;  Laterality: Left;  . BRONCHOSCOPY  02/14/2015   for pulm hemorrhage  . CARDIAC CATHETERIZATION N/A  03/04/2016   Procedure: Left Heart Cath and Coronary Angiography;  Surgeon: Russell Man, MD;  Location: [facility];  Service: Cardiovascular;  Laterality: N/A;  . COLONOSCOPY WITH PROPOFOL N/A 04/07/2017   Procedure: COLONOSCOPY WITH PROPOFOL;  Surgeon: Russell Juniper, MD;  Location: [facility];  Service: Gastroenterology;  Laterality: N/A;  .  EYE SURGERY Left   . INSERTION OF DIALYSIS CATHETER Right 11/08/2016   Procedure: INSERTION OF DIALYSIS CATHETER;  Surgeon: Russell Posner, MD;  Location: [place];  Service: Vascular;  Laterality: Right;  . IR PARACENTESIS  11/13/2016  . LIGATION OF ARTERIOVENOUS  FISTULA Left 07/18/2017   Procedure: LIGATION OF ARTERIOVENOUS  FISTULA;  Surgeon: Russell [place], MD;  Location: [facility];  Service: Vascular;  Laterality: Left;  . PERIPHERAL VASCULAR BALLOON ANGIOPLASTY Left 11/27/2016   Procedure: Peripheral Vascular Balloon Angioplasty;  Surgeon: Russell Sandy, MD;  Location: [facility];  Service: Cardiovascular;  Laterality: Left;  ARM FISTULA  . PERIPHERAL VASCULAR BALLOON ANGIOPLASTY Left 05/07/2017   Procedure: PERIPHERAL VASCULAR BALLOON ANGIOPLASTY;  Surgeon: Russell Sandy, MD;  Location: [facility];  Service: Cardiovascular;  Laterality: Left;    No Known Allergies   Outpatient Medications Prior to Visit  Medication Sig Dispense Refill  . ACCU-CHEK SOFTCLIX LANCETS lancets Use as instructed 3 times daily before meals and at bedtime. E11.9 100 each 12  . Amino Acids-Protein Hydrolys (FEEDING SUPPLEMENT, PRO-STAT SUGAR FREE 64,) LIQD Take 30 mLs by mouth 2 (two) times daily. 900 mL 0  . amLODipine (NORVASC) 10 MG tablet Take 1 tablet (10 mg total) by mouth daily. 30 tablet 0  . aspirin 81 MG chewable tablet Chew 1 tablet (81 mg total) by mouth daily. (Patient taking differently: Chew 81 mg by mouth every evening. )    . atorvastatin (LIPITOR) 40 MG tablet Take 1 tablet (40 mg total) by mouth daily at 6 PM. 30 tablet 5  . b complex-vitamin c-folic acid (NEPHRO-VITE) 0.8 MG TABS tablet Take 1 tablet by mouth at bedtime.    . bisacodyl (DULCOLAX) 10 MG suppository Place 1 suppository (10 mg total) rectally daily as needed for moderate constipation. 12 suppository 0  . Blood Glucose Monitoring Suppl (ACCU-CHEK AVIVA) device Use as instructed 3 times daily before  meals and at bedtime. E11.9 1 each 0  . Blood Glucose Monitoring Suppl (TRUE METRIX METER) DEVI 1 each by Does not apply route 3 (three) times daily. 1 Device 0  . calcitRIOL (ROCALTROL) 0.25 MCG capsule Take 1 capsule (0.25 mcg total) by mouth daily. 30 capsule 6  . carvedilol (COREG) 6.25 MG tablet TAKE 2 TABLETS BY MOUTH 2 TIMES DAILY WITH A MEAL. (Patient taking differently: Take 12.5 mg by mouth 2 (two) times daily. ) 60 tablet 3  . ceFAZolin (ANCEF) 2-4 GM/100ML-% IVPB Inject 100 mLs (2 g total) into the vein every Tuesday, Thursday, and Saturday at 6 PM. 1 each 0  . cyclobenzaprine (FLEXERIL) 10 MG tablet Take 1 tablet (10 mg total) by mouth 3 (three) times daily as needed for up to 30 doses for muscle spasms. 30 tablet 0  . furosemide (LASIX) 80 MG tablet TAKE 1 TABLET BY MOUTH 2 TIMES DAILY. (Patient taking differently: Take 80 mg by mouth daily. ) 60 tablet 2  . gabapentin (NEURONTIN) 300 MG capsule Take 1 capsule (300 mg total) by mouth daily. 30 capsule 5  . glucose blood (ACCU-CHEK AVIVA PLUS) test strip Use 3 times daily before  meals and at bedtime. E11.9 100 each 12  . Lancet Devices (ACCU-CHEK SOFTCLIX) lancets Use as instructed 3 times daily before meals and at bedtime. E11.9 1 each 12  . Nutritional Supplements (FEEDING SUPPLEMENT, NEPRO CARB STEADY,) LIQD Take 237 mLs by mouth 2 (two) times daily between meals. 60 Can 0  . ondansetron (ZOFRAN) 4 MG tablet Take 1 tablet (4 mg total) by mouth every 8 (eight) hours as needed for nausea or vomiting. 30 tablet 1  . RENAGEL 800 MG tablet Take 800-1,600 mg by mouth See admin instructions. Take 1600 mg by mouth 3 times daily with meals and take 800 mg by mouth twice daily with snacks    . senna-docusate (SENOKOT-S) 8.6-50 MG tablet Take 1 tablet by mouth at bedtime. 30 tablet 0  . timolol (TIMOPTIC) 0.5 % ophthalmic solution Place 1 drop into the left eye 2 (two) times daily.    . insulin aspart (NOVOLOG) 100 UNIT/ML injection 0-12 units 3  times daily before meals as well as per sliding scale 10 mL 5  . insulin glargine (LANTUS) 100 UNIT/ML injection Inject subcutaneously 10 units of Lantus in the morning and 15 units of Lantus in the evening (Patient taking differently: Inject 10-12 Units into the skin 2 (two) times daily. Inject subcutaneously 10 units of Lantus in the morning and 12 units of Lantus in the evening) 30 mL 5  . polyethylene glycol (MIRALAX / GLYCOLAX) packet Take 17 g by mouth daily. 14 each 0  . Darbepoetin Alfa (ARANESP) 150 MCG/0.3ML SOSY injection Inject 0.3 mLs (150 mcg total) into the vein every Saturday with hemodialysis. (Patient not taking: Reported on 12/15/2017) 1.68 mL 0  . hydrocortisone (ANUSOL-HC) 2.5 % rectal cream Place rectally 2 (two) times daily. (Patient not taking: Reported on 12/15/2017) 30 g 0   No facility-administered medications prior to visit.     ROS Review of Systems  Constitutional: Negative for activity change and appetite change.  HENT: Negative for sinus pressure and sore throat.   Eyes: Negative for visual disturbance.  Respiratory: Negative for cough, chest tightness and shortness of breath.   Cardiovascular: Negative for chest pain and leg swelling.  Gastrointestinal: Positive for constipation. Negative for abdominal distention, abdominal pain and diarrhea.  Endocrine: Negative.   Genitourinary: Negative for dysuria.  Musculoskeletal: Positive for back pain. Negative for joint swelling and myalgias.  Skin: Negative for rash.  Allergic/Immunologic: Negative.   Neurological: Negative for weakness, light-headedness and numbness.  Psychiatric/Behavioral: Negative for dysphoric mood and suicidal ideas.    Objective:  BP (!) 182/75   Pulse 76   Temp 97.6 F (36.4 C) (Oral)   Ht 5\' 11"  (1.803 m)   Wt 154 lb 9.6 oz (70.1 kg)   SpO2 100%   BMI 21.56 kg/m   BP/Weight 04/20/2018 04/06/2018 32/08/4008  Systolic BP 272 - 536  Diastolic BP 75 - 66  Wt. (Lbs) 154.6 152 163.14    BMI 21.56 21.2 22.75      Physical Exam  Constitutional: He is oriented to person, place, and time. He appears well-developed and well-nourished.  Cardiovascular: Normal rate, normal heart sounds and intact distal pulses.  No murmur heard. Pulmonary/Chest: Effort normal and breath sounds normal. He has no wheezes. He has no rales. He exhibits no tenderness.  Abdominal: Soft. Bowel sounds are normal. He exhibits no distension and no mass. There is no tenderness.  Musculoskeletal:  Tenderness to palpation of lumbar spine  Neurological: He is alert and oriented to person,  place, and time.  Motor strength: Right lower extremity-3+/5 Left lower extremity-4/5  Psychiatric: He has a normal mood and affect.     CMP Latest Ref Rng & Units 04/06/2018 03/21/2018 03/20/2018  Glucose 65 - 99 mg/dL 147(H) 203(H) 136(H)  BUN 7 - 25 mg/dL 40(H) 24(H) 40(H)  Creatinine 0.70 - 1.33 mg/dL 6.11(H) 4.92(H) 7.20(H)  Sodium 135 - 146 mmol/L 134(L) 130(L) 127(L)  Potassium 3.5 - 5.3 mmol/L 4.1 3.8 4.1  Chloride 98 - 110 mmol/L 97(L) 95(L) 90(L)  CO2 20 - 32 mmol/L 26 28 27   Calcium 8.6 - 10.3 mg/dL 10.2 8.7(L) 9.0  Total Protein 6.1 - 8.1 g/dL 8.1 - -  Total Bilirubin 0.2 - 1.2 mg/dL 0.3 - -  Alkaline Phos 38 - 126 U/L - - -  AST 10 - 35 U/L 17 - -  ALT 9 - 46 U/L <3(L) - -    Lipid Panel     Component Value Date/Time   CHOL 92 (L) 10/09/2015 0951   TRIG 49 10/09/2015 0951   HDL 38 (L) 10/09/2015 0951   CHOLHDL 2.4 10/09/2015 0951   VLDL 10 10/09/2015 0951   LDLCALC 44 10/09/2015 0951    CBC    Component Value Date/Time   WBC 6.5 04/06/2018 1644   RBC 3.31 (L) 04/06/2018 1644   HGB 10.1 (L) 04/06/2018 1644   HCT 30.7 (L) 04/06/2018 1644   PLT 224 04/06/2018 1644   MCV 92.7 04/06/2018 1644   MCV 85.0 02/27/2015 1458   MCH 30.5 04/06/2018 1644   MCHC 32.9 04/06/2018 1644   RDW 13.5 04/06/2018 1644   LYMPHSABS 1.8 07/21/2017 2051   MONOABS 0.4 07/21/2017 2051   EOSABS 0.4  07/21/2017 2051   BASOSABS 0.0 07/21/2017 2051    [facility] Results  Component Value Date   HGBA1C 7.5 (H) 03/13/2018     Assessment & Plan:   1. DM (diabetes mellitus) with complications ([place]) Controlled with A1c of 7.5 Continue current regimen - POCT glucose (manual entry) - insulin glargine (LANTUS) 100 UNIT/ML injection; Inject subcutaneously 10 units of Lantus in the morning and 12 units of Lantus in the evening  Dispense: 30 mL; Refill: 6 - insulin aspart (NOVOLOG) 100 UNIT/ML injection; 0-12 units 3 times daily before meals as well as per sliding scale  Dispense: 10 mL; Refill: 5  2. ESRD (end stage renal disease) ([place]) Hemodialysis as per schedule  3. Essential hypertension Elevated blood pressure-yet to take antihypertensives today Compliance emphasized I have reviewed his medications myself and duplicate medications have been put together by means of a rubber band.  I have reviewed empty bottles and empty packs to be discarded.   4. Streptococcal bacteremia White blood cell count is down to normal and he does not manifest symptoms of bacteremia at this time Currently on IV Ancef Has upcoming appointment with infectious disease in 2 weeks  5. Discitis of lumbar region Ongoing back pain Refill tramadol Continue home PT - traMADol (ULTRAM) 50 MG tablet; Take 1 tablet (50 mg total) by mouth every 12 (twelve) hours as needed. Dx: low back pain  Dispense: 40 tablet; Refill: 0  6. Other constipation Increase fiber intake - linaclotide (LINZESS) 72 MCG capsule; Take 1 capsule (72 mcg total) by mouth daily before breakfast.  Dispense: 30 capsule; Refill: 1 - polyethylene glycol (MIRALAX / GLYCOLAX) packet; Take 17 g by mouth daily.  Dispense: 30 each; Refill: 1  7. Lower urinary tract symptoms - tamsulosin (FLOMAX) 0.4 MG CAPS  capsule; Take 1 capsule (0.4 mg total) by mouth daily.  Dispense: 30 capsule; Refill: 3   Meds ordered this encounter  Medications  . tamsulosin  (FLOMAX) 0.4 MG CAPS capsule    Sig: Take 1 capsule (0.4 mg total) by mouth daily.    Dispense:  30 capsule    Refill:  3  . linaclotide (LINZESS) 72 MCG capsule    Sig: Take 1 capsule (72 mcg total) by mouth daily before breakfast.    Dispense:  30 capsule    Refill:  1  . insulin glargine (LANTUS) 100 UNIT/ML injection    Sig: Inject subcutaneously 10 units of Lantus in the morning and 12 units of Lantus in the evening    Dispense:  30 mL    Refill:  6  . insulin aspart (NOVOLOG) 100 UNIT/ML injection    Sig: 0-12 units 3 times daily before meals as well as per sliding scale    Dispense:  10 mL    Refill:  5    As per sliding scale  . traMADol (ULTRAM) 50 MG tablet    Sig: Take 1 tablet (50 mg total) by mouth every 12 (twelve) hours as needed. Dx: low back pain    Dispense:  40 tablet    Refill:  0  . polyethylene glycol (MIRALAX / GLYCOLAX) packet    Sig: Take 17 g by mouth daily.    Dispense:  30 each    Refill:  1    Follow-up: Return in about 3 months (around 07/20/2018) for Follow-up of chronic medical conditions.   Charlott Rakes MD

## 2018-04-20 NOTE — Patient Instructions (Signed)

## 2018-04-21 DIAGNOSIS — D631 Anemia in chronic kidney disease: Secondary | ICD-10-CM | POA: Diagnosis not present

## 2018-04-21 DIAGNOSIS — D509 Iron deficiency anemia, unspecified: Secondary | ICD-10-CM | POA: Diagnosis not present

## 2018-04-21 DIAGNOSIS — N2581 Secondary hyperparathyroidism of renal origin: Secondary | ICD-10-CM | POA: Diagnosis not present

## 2018-04-21 DIAGNOSIS — M4807 Spinal stenosis, lumbosacral region: Secondary | ICD-10-CM | POA: Diagnosis not present

## 2018-04-21 DIAGNOSIS — E1129 Type 2 diabetes mellitus with other diabetic kidney complication: Secondary | ICD-10-CM | POA: Diagnosis not present

## 2018-04-21 DIAGNOSIS — A499 Bacterial infection, unspecified: Secondary | ICD-10-CM | POA: Diagnosis not present

## 2018-04-21 DIAGNOSIS — N186 End stage renal disease: Secondary | ICD-10-CM | POA: Diagnosis not present

## 2018-04-22 DIAGNOSIS — I120 Hypertensive chronic kidney disease with stage 5 chronic kidney disease or end stage renal disease: Secondary | ICD-10-CM | POA: Diagnosis not present

## 2018-04-22 DIAGNOSIS — M5440 Lumbago with sciatica, unspecified side: Secondary | ICD-10-CM | POA: Diagnosis not present

## 2018-04-22 DIAGNOSIS — N186 End stage renal disease: Secondary | ICD-10-CM | POA: Diagnosis not present

## 2018-04-22 DIAGNOSIS — E1140 Type 2 diabetes mellitus with diabetic neuropathy, unspecified: Secondary | ICD-10-CM | POA: Diagnosis not present

## 2018-04-22 DIAGNOSIS — M48061 Spinal stenosis, lumbar region without neurogenic claudication: Secondary | ICD-10-CM | POA: Diagnosis not present

## 2018-04-22 DIAGNOSIS — I2510 Atherosclerotic heart disease of native coronary artery without angina pectoris: Secondary | ICD-10-CM | POA: Diagnosis not present

## 2018-04-23 DIAGNOSIS — A499 Bacterial infection, unspecified: Secondary | ICD-10-CM | POA: Diagnosis not present

## 2018-04-23 DIAGNOSIS — N2581 Secondary hyperparathyroidism of renal origin: Secondary | ICD-10-CM | POA: Diagnosis not present

## 2018-04-23 DIAGNOSIS — E1129 Type 2 diabetes mellitus with other diabetic kidney complication: Secondary | ICD-10-CM | POA: Diagnosis not present

## 2018-04-23 DIAGNOSIS — M4807 Spinal stenosis, lumbosacral region: Secondary | ICD-10-CM | POA: Diagnosis not present

## 2018-04-23 DIAGNOSIS — N186 End stage renal disease: Secondary | ICD-10-CM | POA: Diagnosis not present

## 2018-04-23 DIAGNOSIS — D509 Iron deficiency anemia, unspecified: Secondary | ICD-10-CM | POA: Diagnosis not present

## 2018-04-24 DIAGNOSIS — M5440 Lumbago with sciatica, unspecified side: Secondary | ICD-10-CM | POA: Diagnosis not present

## 2018-04-24 DIAGNOSIS — M48061 Spinal stenosis, lumbar region without neurogenic claudication: Secondary | ICD-10-CM | POA: Diagnosis not present

## 2018-04-24 DIAGNOSIS — N186 End stage renal disease: Secondary | ICD-10-CM | POA: Diagnosis not present

## 2018-04-24 DIAGNOSIS — I2510 Atherosclerotic heart disease of native coronary artery without angina pectoris: Secondary | ICD-10-CM | POA: Diagnosis not present

## 2018-04-24 DIAGNOSIS — I120 Hypertensive chronic kidney disease with stage 5 chronic kidney disease or end stage renal disease: Secondary | ICD-10-CM | POA: Diagnosis not present

## 2018-04-24 DIAGNOSIS — E1140 Type 2 diabetes mellitus with diabetic neuropathy, unspecified: Secondary | ICD-10-CM | POA: Diagnosis not present

## 2018-04-25 DIAGNOSIS — N186 End stage renal disease: Secondary | ICD-10-CM | POA: Diagnosis not present

## 2018-04-25 DIAGNOSIS — D509 Iron deficiency anemia, unspecified: Secondary | ICD-10-CM | POA: Diagnosis not present

## 2018-04-25 DIAGNOSIS — E1129 Type 2 diabetes mellitus with other diabetic kidney complication: Secondary | ICD-10-CM | POA: Diagnosis not present

## 2018-04-25 DIAGNOSIS — M4807 Spinal stenosis, lumbosacral region: Secondary | ICD-10-CM | POA: Diagnosis not present

## 2018-04-25 DIAGNOSIS — A499 Bacterial infection, unspecified: Secondary | ICD-10-CM | POA: Diagnosis not present

## 2018-04-25 DIAGNOSIS — N2581 Secondary hyperparathyroidism of renal origin: Secondary | ICD-10-CM | POA: Diagnosis not present

## 2018-04-27 DIAGNOSIS — I2510 Atherosclerotic heart disease of native coronary artery without angina pectoris: Secondary | ICD-10-CM | POA: Diagnosis not present

## 2018-04-27 DIAGNOSIS — E1140 Type 2 diabetes mellitus with diabetic neuropathy, unspecified: Secondary | ICD-10-CM | POA: Diagnosis not present

## 2018-04-27 DIAGNOSIS — N186 End stage renal disease: Secondary | ICD-10-CM | POA: Diagnosis not present

## 2018-04-27 DIAGNOSIS — M5440 Lumbago with sciatica, unspecified side: Secondary | ICD-10-CM | POA: Diagnosis not present

## 2018-04-27 DIAGNOSIS — M48061 Spinal stenosis, lumbar region without neurogenic claudication: Secondary | ICD-10-CM | POA: Diagnosis not present

## 2018-04-27 DIAGNOSIS — I120 Hypertensive chronic kidney disease with stage 5 chronic kidney disease or end stage renal disease: Secondary | ICD-10-CM | POA: Diagnosis not present

## 2018-04-28 DIAGNOSIS — N2581 Secondary hyperparathyroidism of renal origin: Secondary | ICD-10-CM | POA: Diagnosis not present

## 2018-04-28 DIAGNOSIS — M4807 Spinal stenosis, lumbosacral region: Secondary | ICD-10-CM | POA: Diagnosis not present

## 2018-04-28 DIAGNOSIS — D509 Iron deficiency anemia, unspecified: Secondary | ICD-10-CM | POA: Diagnosis not present

## 2018-04-28 DIAGNOSIS — E1129 Type 2 diabetes mellitus with other diabetic kidney complication: Secondary | ICD-10-CM | POA: Diagnosis not present

## 2018-04-28 DIAGNOSIS — A499 Bacterial infection, unspecified: Secondary | ICD-10-CM | POA: Diagnosis not present

## 2018-04-28 DIAGNOSIS — N186 End stage renal disease: Secondary | ICD-10-CM | POA: Diagnosis not present

## 2018-04-29 DIAGNOSIS — N186 End stage renal disease: Secondary | ICD-10-CM | POA: Diagnosis not present

## 2018-04-29 DIAGNOSIS — M48061 Spinal stenosis, lumbar region without neurogenic claudication: Secondary | ICD-10-CM | POA: Diagnosis not present

## 2018-04-29 DIAGNOSIS — E1140 Type 2 diabetes mellitus with diabetic neuropathy, unspecified: Secondary | ICD-10-CM | POA: Diagnosis not present

## 2018-04-29 DIAGNOSIS — I120 Hypertensive chronic kidney disease with stage 5 chronic kidney disease or end stage renal disease: Secondary | ICD-10-CM | POA: Diagnosis not present

## 2018-04-29 DIAGNOSIS — I2510 Atherosclerotic heart disease of native coronary artery without angina pectoris: Secondary | ICD-10-CM | POA: Diagnosis not present

## 2018-04-29 DIAGNOSIS — M5440 Lumbago with sciatica, unspecified side: Secondary | ICD-10-CM | POA: Diagnosis not present

## 2018-04-30 DIAGNOSIS — D509 Iron deficiency anemia, unspecified: Secondary | ICD-10-CM | POA: Diagnosis not present

## 2018-04-30 DIAGNOSIS — M4807 Spinal stenosis, lumbosacral region: Secondary | ICD-10-CM | POA: Diagnosis not present

## 2018-04-30 DIAGNOSIS — A499 Bacterial infection, unspecified: Secondary | ICD-10-CM | POA: Diagnosis not present

## 2018-04-30 DIAGNOSIS — E1129 Type 2 diabetes mellitus with other diabetic kidney complication: Secondary | ICD-10-CM | POA: Diagnosis not present

## 2018-04-30 DIAGNOSIS — N186 End stage renal disease: Secondary | ICD-10-CM | POA: Diagnosis not present

## 2018-04-30 DIAGNOSIS — N2581 Secondary hyperparathyroidism of renal origin: Secondary | ICD-10-CM | POA: Diagnosis not present

## 2018-05-01 DIAGNOSIS — N186 End stage renal disease: Secondary | ICD-10-CM | POA: Diagnosis not present

## 2018-05-01 DIAGNOSIS — M48061 Spinal stenosis, lumbar region without neurogenic claudication: Secondary | ICD-10-CM | POA: Diagnosis not present

## 2018-05-01 DIAGNOSIS — E1140 Type 2 diabetes mellitus with diabetic neuropathy, unspecified: Secondary | ICD-10-CM | POA: Diagnosis not present

## 2018-05-01 DIAGNOSIS — I2510 Atherosclerotic heart disease of native coronary artery without angina pectoris: Secondary | ICD-10-CM | POA: Diagnosis not present

## 2018-05-01 DIAGNOSIS — M5440 Lumbago with sciatica, unspecified side: Secondary | ICD-10-CM | POA: Diagnosis not present

## 2018-05-01 DIAGNOSIS — I120 Hypertensive chronic kidney disease with stage 5 chronic kidney disease or end stage renal disease: Secondary | ICD-10-CM | POA: Diagnosis not present

## 2018-05-02 DIAGNOSIS — N186 End stage renal disease: Secondary | ICD-10-CM | POA: Diagnosis not present

## 2018-05-02 DIAGNOSIS — E1129 Type 2 diabetes mellitus with other diabetic kidney complication: Secondary | ICD-10-CM | POA: Diagnosis not present

## 2018-05-02 DIAGNOSIS — A499 Bacterial infection, unspecified: Secondary | ICD-10-CM | POA: Diagnosis not present

## 2018-05-02 DIAGNOSIS — N2581 Secondary hyperparathyroidism of renal origin: Secondary | ICD-10-CM | POA: Diagnosis not present

## 2018-05-02 DIAGNOSIS — D509 Iron deficiency anemia, unspecified: Secondary | ICD-10-CM | POA: Diagnosis not present

## 2018-05-02 DIAGNOSIS — M4807 Spinal stenosis, lumbosacral region: Secondary | ICD-10-CM | POA: Diagnosis not present

## 2018-05-03 NOTE — Progress Notes (Signed)
Patient: Russell Price  DOB: [birth year] MRN: [id number] PCP: Russell Rakes, MD  Referring Provider: hospital follow up   Patient Active Problem List   Diagnosis Date Noted  . Breast mass in male 05/04/2018  . Polypharmacy 04/07/2018  . Chronic back pain 03/13/2018  . Bilateral leg weakness 03/13/2018  . Hyponatremia 11/06/2016  . Fluid overload 11/06/2016  . CAD (coronary artery disease)   . ESRD (end stage renal disease) ([place])   . Diabetic neuropathy ([place]) 10/05/2015  . Left arm weakness   . Hyperlipidemia 09/21/2015  . Labile blood pressure   . Labile blood glucose   . Diabetes mellitus type 2 in nonobese (HCC)   . Anemia of chronic disease   . Debility 09/08/2015  . Seizure-like activity ([place])   . Cerebral thrombosis with cerebral infarction ([place]) 02/18/2015  . Essential hypertension 11/11/2014  . DM (diabetes mellitus) with complications ([place]) 32/20/2542  . Blind right eye 11/11/2014     Subjective:  Brief ID Narrative:  Russell Price is a 57 y.o. male with pmhx of ESRD on TTS, HTN, DMII. He was treated at [facility] for strep gordonae bacteremia thought to be rleated to early/developing discitis (acute and sudden worsening back pain in the setting of fever/bacteremia; MRI negative). Treated with cefazolin IV after HD x 6 weeks through 04/26/18.   CC:  Follow up. Concerned with hard and painful nipples today.    HPI:  At our last office visit together Russell Price was still having significant back pain and R>L LE weakness that was exacerbated with changing positions. He has been working 3x a week with home physical therapy and is now feeling much improved. He still has some R>L LE weakness but feels stronger. No pain when he touches the back but just has ongoing pain. No fevers/chills. Dialysis is going well. He saw his PCP recently. No fevers/chills or worsening symptoms.   He tells me that he has had painful erect nipples that he has since noticed since last  visit. No discharge or bleeding, but can feel lumps behind both nipples. There is tenderness.   Review of Systems  Constitutional: Negative for chills, fever, malaise/fatigue and weight loss.  HENT: Negative for sore throat.        No dental problems  Respiratory: Negative for cough and sputum production.   Cardiovascular: Negative for chest pain and leg swelling.  Gastrointestinal: Negative for abdominal pain, diarrhea and vomiting.  Genitourinary: Negative for dysuria and flank pain.  Musculoskeletal: Negative for joint pain, myalgias and neck pain.  Skin: Negative for rash.  Neurological: Negative for dizziness, tingling and headaches.  Psychiatric/Behavioral: Negative for depression and substance abuse. The patient is not nervous/anxious and does not have insomnia.     Past Medical History:  Diagnosis Date  . Back pain 03/12/2018  . CAD (coronary artery disease)    a. underwent cath in 02/2016 after an abnormal nuc which showed 2V CAD in RCA and OM3 with no good targets for PCI. Medical managment recommended.   . Chronic kidney disease   . Diabetes mellitus without complication ([place])    type II - followed by Dr Chalmers Cater   . Difficult intubation    ER intubation 2016  . ESRD (end stage renal disease) ([place])    Tues, Th, Sat [place]  . Fever 03/12/2018  . Glaucoma   . Hemodialysis patient [facility])    has been on dialysis since approx 10/2016   . Hypertension  followed by [facility]  . Stroke [facility])    patient denies on 02/19/2017     Outpatient Medications Prior to Visit  Medication Sig Dispense Refill  . ACCU-CHEK SOFTCLIX LANCETS lancets Use as instructed 3 times daily before meals and at bedtime. E11.9 100 each 12  . Amino Acids-Protein Hydrolys (FEEDING SUPPLEMENT, PRO-STAT SUGAR FREE 64,) LIQD Take 30 mLs by mouth 2 (two) times daily. 900 mL 0  . amLODipine (NORVASC) 10 MG tablet Take 1 tablet (10 mg total) by mouth daily. 30 tablet 0  . aspirin 81 MG  chewable tablet Chew 1 tablet (81 mg total) by mouth daily. (Patient taking differently: Chew 81 mg by mouth every evening. )    . atorvastatin (LIPITOR) 40 MG tablet Take 1 tablet (40 mg total) by mouth daily at 6 PM. 30 tablet 5  . b complex-vitamin c-folic acid (NEPHRO-VITE) 0.8 MG TABS tablet Take 1 tablet by mouth at bedtime.    . bisacodyl (DULCOLAX) 10 MG suppository Place 1 suppository (10 mg total) rectally daily as needed for moderate constipation. 12 suppository 0  . Blood Glucose Monitoring Suppl (ACCU-CHEK AVIVA) device Use as instructed 3 times daily before meals and at bedtime. E11.9 1 each 0  . Blood Glucose Monitoring Suppl (TRUE METRIX METER) DEVI 1 each by Does not apply route 3 (three) times daily. 1 Device 0  . Darbepoetin Alfa (ARANESP) 150 MCG/0.3ML SOSY injection Inject 0.3 mLs (150 mcg total) into the vein every Saturday with hemodialysis. 1.68 mL 0  . furosemide (LASIX) 80 MG tablet TAKE 1 TABLET BY MOUTH 2 TIMES DAILY. (Patient taking differently: Take 80 mg by mouth daily. ) 60 tablet 2  . gabapentin (NEURONTIN) 300 MG capsule Take 1 capsule (300 mg total) by mouth daily. 30 capsule 5  . glucose blood (ACCU-CHEK AVIVA PLUS) test strip Use 3 times daily before meals and at bedtime. E11.9 100 each 12  . hydrocortisone (ANUSOL-HC) 2.5 % rectal cream Place rectally 2 (two) times daily. 30 g 0  . insulin aspart (NOVOLOG) 100 UNIT/ML injection 0-12 units 3 times daily before meals as well as per sliding scale 10 mL 5  . insulin glargine (LANTUS) 100 UNIT/ML injection Inject subcutaneously 10 units of Lantus in the morning and 12 units of Lantus in the evening 30 mL 6  . Lancet Devices (ACCU-CHEK SOFTCLIX) lancets Use as instructed 3 times daily before meals and at bedtime. E11.9 1 each 12  . linaclotide (LINZESS) 72 MCG capsule Take 1 capsule (72 mcg total) by mouth daily before breakfast. 30 capsule 1  . Misc. Devices MISC Tub transfer bench. Dx: acute discitis and low back pain  1 each 0  . Nutritional Supplements (FEEDING SUPPLEMENT, NEPRO CARB STEADY,) LIQD Take 237 mLs by mouth 2 (two) times daily between meals. 60 Can 0  . polyethylene glycol (MIRALAX / GLYCOLAX) packet Take 17 g by mouth daily. 30 each 1  . RENAGEL 800 MG tablet Take 800-1,600 mg by mouth See admin instructions. Take 1600 mg by mouth 3 times daily with meals and take 800 mg by mouth twice daily with snacks    . timolol (TIMOPTIC) 0.5 % ophthalmic solution Place 1 drop into the left eye 2 (two) times daily.    . traMADol (ULTRAM) 50 MG tablet Take 1 tablet (50 mg total) by mouth every 12 (twelve) hours as needed. Dx: low back pain 40 tablet 0  . calcitRIOL (ROCALTROL) 0.25 MCG capsule Take 1 capsule (0.25 mcg  total) by mouth daily. (Patient not taking: Reported on 05/04/2018) 30 capsule 6  . carvedilol (COREG) 6.25 MG tablet TAKE 2 TABLETS BY MOUTH 2 TIMES DAILY WITH A MEAL. (Patient not taking: No sig reported) 60 tablet 3  . cyclobenzaprine (FLEXERIL) 10 MG tablet Take 1 tablet (10 mg total) by mouth 3 (three) times daily as needed for up to 30 doses for muscle spasms. (Patient not taking: Reported on 05/04/2018) 30 tablet 0  . ondansetron (ZOFRAN) 4 MG tablet Take 1 tablet (4 mg total) by mouth every 8 (eight) hours as needed for nausea or vomiting. (Patient not taking: Reported on 05/04/2018) 30 tablet 1  . tamsulosin (FLOMAX) 0.4 MG CAPS capsule Take 1 capsule (0.4 mg total) by mouth daily. (Patient not taking: Reported on 05/04/2018) 30 capsule 3   No facility-administered medications prior to visit.      No Known Allergies  Social History   Tobacco Use  . Smoking status: Former Smoker    Packs/day: 0.00    Years: 4.00    Pack years: 0.00    Last attempt to quit: 02/23/1983    Years since quitting: 35.2  . Smokeless tobacco: Never Used  Substance Use Topics  . Alcohol use: No  . Drug use: No    Objective:   Vitals:   05/04/18 0936  BP: (!) 181/84  Pulse: (!) 45  Temp: 97.7 F  (36.5 C)  Weight: 150 lb (68 kg)   Body mass index is 20.92 kg/m.  Physical Exam Vitals signs reviewed.  Constitutional:      Appearance: Normal appearance.     Comments: Smiling today. Ambulating with walker. His wife is present today.   HENT:     Mouth/Throat:     Mouth: Mucous membranes are moist.     Pharynx: Oropharynx is clear.  Eyes:     General: No scleral icterus.    Pupils: Pupils are equal, round, and reactive to light.  Neck:     Musculoskeletal: Normal range of motion.  Cardiovascular:     Rate and Rhythm: Bradycardia present. Rhythm irregular.     Pulses: Normal pulses.     Heart sounds: No murmur.  Pulmonary:     Effort: Pulmonary effort is normal.     Breath sounds: Normal breath sounds.  Chest:     Breasts:        Right: Mass (2 cm mass located behind nipple) and tenderness present. No inverted nipple, nipple discharge or skin change.        Left: Mass (2 cm mass located behind nipple) and tenderness present. No inverted nipple, nipple discharge or skin change.  Musculoskeletal:     Comments: Weakness R>L leg - requires hands to support him from sitting to standing. Gait is normal.   Skin:    General: Skin is warm and dry.  Neurological:     Mental Status: He is alert.    Lab Results: Lab Results  Component Value Date   WBC 6.5 04/06/2018   HGB 10.1 (L) 04/06/2018   HCT 30.7 (L) 04/06/2018   MCV 92.7 04/06/2018   PLT 224 04/06/2018    Lab Results  Component Value Date   CREATININE 6.11 (H) 04/06/2018   BUN 40 (H) 04/06/2018   NA 134 (L) 04/06/2018   K 4.1 04/06/2018   CL 97 (L) 04/06/2018   CO2 26 04/06/2018    Lab Results  Component Value Date   ALT <3 (L) 04/06/2018   AST 17  04/06/2018   ALKPHOS 113 07/21/2017   BILITOT 0.3 04/06/2018     Assessment & Plan:   Problem List Items Addressed This Visit      Unprioritized   RESOLVED: Streptococcal bacteremia - Primary    No symptoms that would indicated relapsed bacteremia. He has  been adequately treated with IV cefazolin for prolonged course d/t concern over secondary bacteremia related to infectious discitis.       Chronic back pain    Improving with physical therapy s/p antibiotics. I suspect with lower extremity weakness he may still need evaluation with surgery team if this does not improve based on MRI findings with severe stenosis and disk extrusion.       Breast mass in male    He has palpable ~2 cm firm masses behind each nipple. Some gynecomastia present. No discharge from either nipple. I will send a note to his primary care provider to see if they can help with further work up/arranging breast ultrasound vs mammogram. I do not see where he has discussed with Dr. Margarita Rana about this in the past.         Return if symptoms worsen or fail to improve.   Janene Madeira, MSN, NP-C [facility] for Infectious [place] Pager: [phone] Office: [phone]  05/04/18  1:31 PM

## 2018-05-04 ENCOUNTER — Ambulatory Visit (INDEPENDENT_AMBULATORY_CARE_PROVIDER_SITE_OTHER): Payer: Medicare Other | Admitting: Infectious Diseases

## 2018-05-04 ENCOUNTER — Encounter: Payer: Self-pay | Admitting: Infectious Diseases

## 2018-05-04 VITALS — BP 181/84 | HR 45 | Temp 97.7°F | Wt 150.0 lb

## 2018-05-04 DIAGNOSIS — B955 Unspecified streptococcus as the cause of diseases classified elsewhere: Secondary | ICD-10-CM

## 2018-05-04 DIAGNOSIS — M5440 Lumbago with sciatica, unspecified side: Secondary | ICD-10-CM

## 2018-05-04 DIAGNOSIS — G8929 Other chronic pain: Secondary | ICD-10-CM | POA: Diagnosis not present

## 2018-05-04 DIAGNOSIS — N63 Unspecified lump in unspecified breast: Secondary | ICD-10-CM | POA: Diagnosis not present

## 2018-05-04 DIAGNOSIS — R7881 Bacteremia: Secondary | ICD-10-CM | POA: Diagnosis not present

## 2018-05-04 IMAGING — DX DG CHEST 1V PORT
1 series · 1 of 1 positions shown · non-contrast
Comparison: 11/08/2016

CLINICAL DATA: Fevers

EXAM:
PORTABLE CHEST 1 VIEW

[chest ap]
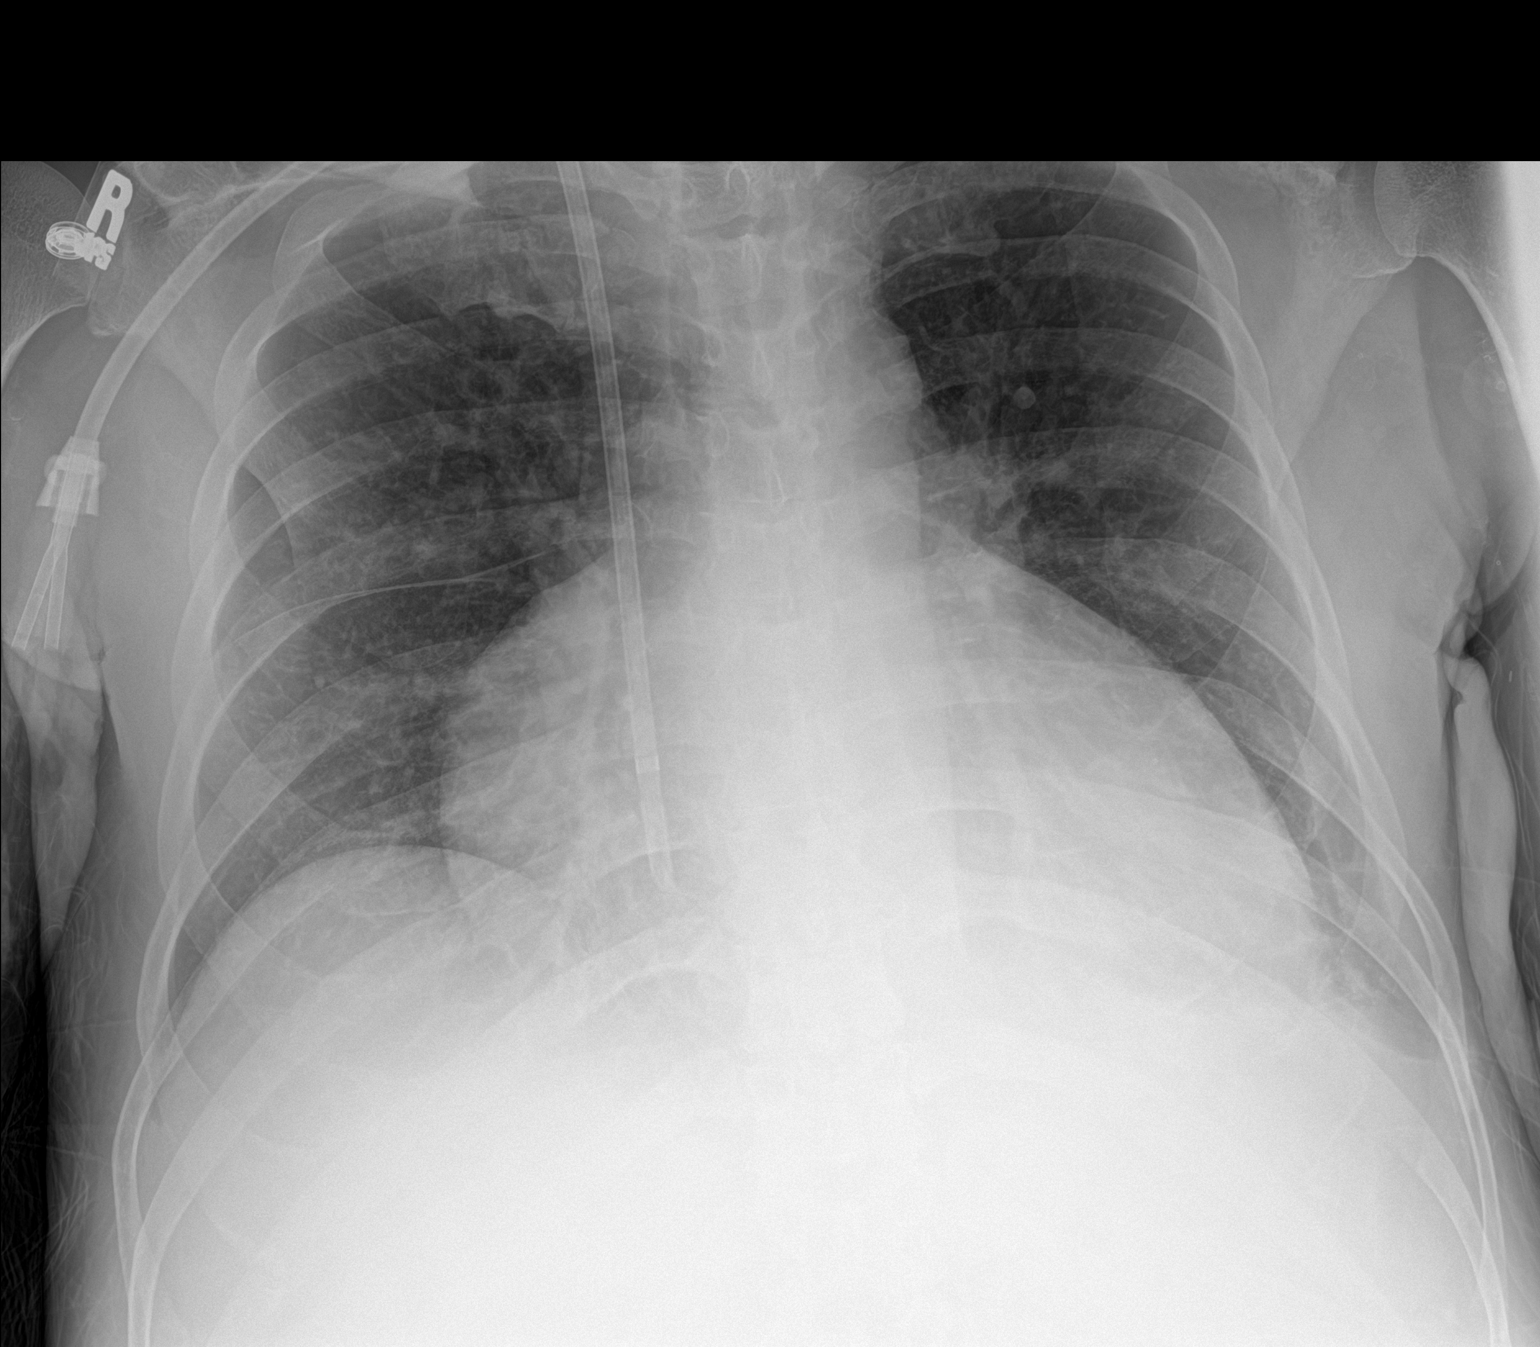

[1 of 1 positions shown; findings below may reference images not displayed]

FINDINGS: Cardiac shadow remains enlarged. Dialysis catheter is again noted in
satisfactory position. Interval clearing in the bases bilaterally is
noted. No focal confluent infiltrate or sizable effusion is seen. No
bony abnormality is noted.
IMPRESSION: Improved aeration in the bases when compared with the prior exam

## 2018-05-04 NOTE — Assessment & Plan Note (Signed)
Improving with physical therapy s/p antibiotics. I suspect with lower extremity weakness he may still need evaluation with surgery team if this does not improve based on MRI findings with severe stenosis and disk extrusion.

## 2018-05-04 NOTE — Assessment & Plan Note (Signed)
No symptoms that would indicated relapsed bacteremia. He has been adequately treated with IV cefazolin for prolonged course d/t concern over secondary bacteremia related to infectious discitis.

## 2018-05-04 NOTE — Assessment & Plan Note (Signed)
He has palpable ~2 cm firm masses behind each nipple. Some gynecomastia present. No discharge from either nipple. I will send a note to his primary care provider to see if they can help with further work up/arranging breast ultrasound vs mammogram. I do not see where he has discussed with Dr. Margarita Rana about this in the past.

## 2018-05-04 NOTE — Patient Instructions (Signed)
You are moving much better than last time I saw you!   No further antibiotics needed at this time - I think you will continue to improve with physical therapy.   Regarding your nipple concern - I will discuss with Dr. Margarita Rana to see if she would help to arrange a breast ultrasound or mammogram to evaluate this for you.

## 2018-05-05 ENCOUNTER — Other Ambulatory Visit: Payer: Self-pay | Admitting: Nephrology

## 2018-05-05 DIAGNOSIS — E1129 Type 2 diabetes mellitus with other diabetic kidney complication: Secondary | ICD-10-CM | POA: Diagnosis not present

## 2018-05-05 DIAGNOSIS — N186 End stage renal disease: Secondary | ICD-10-CM | POA: Diagnosis not present

## 2018-05-05 DIAGNOSIS — D509 Iron deficiency anemia, unspecified: Secondary | ICD-10-CM | POA: Diagnosis not present

## 2018-05-05 DIAGNOSIS — N6320 Unspecified lump in the left breast, unspecified quadrant: Secondary | ICD-10-CM

## 2018-05-05 DIAGNOSIS — N2581 Secondary hyperparathyroidism of renal origin: Secondary | ICD-10-CM | POA: Diagnosis not present

## 2018-05-05 DIAGNOSIS — M4807 Spinal stenosis, lumbosacral region: Secondary | ICD-10-CM | POA: Diagnosis not present

## 2018-05-05 DIAGNOSIS — A499 Bacterial infection, unspecified: Secondary | ICD-10-CM | POA: Diagnosis not present

## 2018-05-06 DIAGNOSIS — M5440 Lumbago with sciatica, unspecified side: Secondary | ICD-10-CM | POA: Diagnosis not present

## 2018-05-06 DIAGNOSIS — N186 End stage renal disease: Secondary | ICD-10-CM | POA: Diagnosis not present

## 2018-05-06 DIAGNOSIS — I120 Hypertensive chronic kidney disease with stage 5 chronic kidney disease or end stage renal disease: Secondary | ICD-10-CM | POA: Diagnosis not present

## 2018-05-06 DIAGNOSIS — I2510 Atherosclerotic heart disease of native coronary artery without angina pectoris: Secondary | ICD-10-CM | POA: Diagnosis not present

## 2018-05-06 DIAGNOSIS — M48061 Spinal stenosis, lumbar region without neurogenic claudication: Secondary | ICD-10-CM | POA: Diagnosis not present

## 2018-05-06 DIAGNOSIS — E1140 Type 2 diabetes mellitus with diabetic neuropathy, unspecified: Secondary | ICD-10-CM | POA: Diagnosis not present

## 2018-05-07 DIAGNOSIS — D509 Iron deficiency anemia, unspecified: Secondary | ICD-10-CM | POA: Diagnosis not present

## 2018-05-07 DIAGNOSIS — E1129 Type 2 diabetes mellitus with other diabetic kidney complication: Secondary | ICD-10-CM | POA: Diagnosis not present

## 2018-05-07 DIAGNOSIS — N186 End stage renal disease: Secondary | ICD-10-CM | POA: Diagnosis not present

## 2018-05-07 DIAGNOSIS — N2581 Secondary hyperparathyroidism of renal origin: Secondary | ICD-10-CM | POA: Diagnosis not present

## 2018-05-07 DIAGNOSIS — A499 Bacterial infection, unspecified: Secondary | ICD-10-CM | POA: Diagnosis not present

## 2018-05-07 DIAGNOSIS — M4807 Spinal stenosis, lumbosacral region: Secondary | ICD-10-CM | POA: Diagnosis not present

## 2018-05-08 DIAGNOSIS — E1140 Type 2 diabetes mellitus with diabetic neuropathy, unspecified: Secondary | ICD-10-CM | POA: Diagnosis not present

## 2018-05-08 DIAGNOSIS — M48061 Spinal stenosis, lumbar region without neurogenic claudication: Secondary | ICD-10-CM | POA: Diagnosis not present

## 2018-05-08 DIAGNOSIS — M5440 Lumbago with sciatica, unspecified side: Secondary | ICD-10-CM | POA: Diagnosis not present

## 2018-05-08 DIAGNOSIS — I2510 Atherosclerotic heart disease of native coronary artery without angina pectoris: Secondary | ICD-10-CM | POA: Diagnosis not present

## 2018-05-08 DIAGNOSIS — I120 Hypertensive chronic kidney disease with stage 5 chronic kidney disease or end stage renal disease: Secondary | ICD-10-CM | POA: Diagnosis not present

## 2018-05-08 DIAGNOSIS — N186 End stage renal disease: Secondary | ICD-10-CM | POA: Diagnosis not present

## 2018-05-09 DIAGNOSIS — M4807 Spinal stenosis, lumbosacral region: Secondary | ICD-10-CM | POA: Diagnosis not present

## 2018-05-09 DIAGNOSIS — A499 Bacterial infection, unspecified: Secondary | ICD-10-CM | POA: Diagnosis not present

## 2018-05-09 DIAGNOSIS — E1129 Type 2 diabetes mellitus with other diabetic kidney complication: Secondary | ICD-10-CM | POA: Diagnosis not present

## 2018-05-09 DIAGNOSIS — N2581 Secondary hyperparathyroidism of renal origin: Secondary | ICD-10-CM | POA: Diagnosis not present

## 2018-05-09 DIAGNOSIS — N186 End stage renal disease: Secondary | ICD-10-CM | POA: Diagnosis not present

## 2018-05-09 DIAGNOSIS — D509 Iron deficiency anemia, unspecified: Secondary | ICD-10-CM | POA: Diagnosis not present

## 2018-05-11 DIAGNOSIS — I2510 Atherosclerotic heart disease of native coronary artery without angina pectoris: Secondary | ICD-10-CM | POA: Diagnosis not present

## 2018-05-11 DIAGNOSIS — A499 Bacterial infection, unspecified: Secondary | ICD-10-CM | POA: Diagnosis not present

## 2018-05-11 DIAGNOSIS — N2581 Secondary hyperparathyroidism of renal origin: Secondary | ICD-10-CM | POA: Diagnosis not present

## 2018-05-11 DIAGNOSIS — M4807 Spinal stenosis, lumbosacral region: Secondary | ICD-10-CM | POA: Diagnosis not present

## 2018-05-11 DIAGNOSIS — M48061 Spinal stenosis, lumbar region without neurogenic claudication: Secondary | ICD-10-CM | POA: Diagnosis not present

## 2018-05-11 DIAGNOSIS — D509 Iron deficiency anemia, unspecified: Secondary | ICD-10-CM | POA: Diagnosis not present

## 2018-05-11 DIAGNOSIS — I120 Hypertensive chronic kidney disease with stage 5 chronic kidney disease or end stage renal disease: Secondary | ICD-10-CM | POA: Diagnosis not present

## 2018-05-11 DIAGNOSIS — E1140 Type 2 diabetes mellitus with diabetic neuropathy, unspecified: Secondary | ICD-10-CM | POA: Diagnosis not present

## 2018-05-11 DIAGNOSIS — E1129 Type 2 diabetes mellitus with other diabetic kidney complication: Secondary | ICD-10-CM | POA: Diagnosis not present

## 2018-05-11 DIAGNOSIS — N186 End stage renal disease: Secondary | ICD-10-CM | POA: Diagnosis not present

## 2018-05-11 DIAGNOSIS — M5440 Lumbago with sciatica, unspecified side: Secondary | ICD-10-CM | POA: Diagnosis not present

## 2018-05-14 DIAGNOSIS — A499 Bacterial infection, unspecified: Secondary | ICD-10-CM | POA: Diagnosis not present

## 2018-05-14 DIAGNOSIS — N2581 Secondary hyperparathyroidism of renal origin: Secondary | ICD-10-CM | POA: Diagnosis not present

## 2018-05-14 DIAGNOSIS — N186 End stage renal disease: Secondary | ICD-10-CM | POA: Diagnosis not present

## 2018-05-14 DIAGNOSIS — E1129 Type 2 diabetes mellitus with other diabetic kidney complication: Secondary | ICD-10-CM | POA: Diagnosis not present

## 2018-05-14 DIAGNOSIS — M4807 Spinal stenosis, lumbosacral region: Secondary | ICD-10-CM | POA: Diagnosis not present

## 2018-05-14 DIAGNOSIS — D509 Iron deficiency anemia, unspecified: Secondary | ICD-10-CM | POA: Diagnosis not present

## 2018-05-15 DIAGNOSIS — N186 End stage renal disease: Secondary | ICD-10-CM | POA: Diagnosis not present

## 2018-05-15 DIAGNOSIS — I120 Hypertensive chronic kidney disease with stage 5 chronic kidney disease or end stage renal disease: Secondary | ICD-10-CM | POA: Diagnosis not present

## 2018-05-15 DIAGNOSIS — M5440 Lumbago with sciatica, unspecified side: Secondary | ICD-10-CM | POA: Diagnosis not present

## 2018-05-15 DIAGNOSIS — M48061 Spinal stenosis, lumbar region without neurogenic claudication: Secondary | ICD-10-CM | POA: Diagnosis not present

## 2018-05-15 DIAGNOSIS — E1140 Type 2 diabetes mellitus with diabetic neuropathy, unspecified: Secondary | ICD-10-CM | POA: Diagnosis not present

## 2018-05-15 DIAGNOSIS — I2510 Atherosclerotic heart disease of native coronary artery without angina pectoris: Secondary | ICD-10-CM | POA: Diagnosis not present

## 2018-05-16 DIAGNOSIS — D509 Iron deficiency anemia, unspecified: Secondary | ICD-10-CM | POA: Diagnosis not present

## 2018-05-16 DIAGNOSIS — M4807 Spinal stenosis, lumbosacral region: Secondary | ICD-10-CM | POA: Diagnosis not present

## 2018-05-16 DIAGNOSIS — E1129 Type 2 diabetes mellitus with other diabetic kidney complication: Secondary | ICD-10-CM | POA: Diagnosis not present

## 2018-05-16 DIAGNOSIS — N2581 Secondary hyperparathyroidism of renal origin: Secondary | ICD-10-CM | POA: Diagnosis not present

## 2018-05-16 DIAGNOSIS — N186 End stage renal disease: Secondary | ICD-10-CM | POA: Diagnosis not present

## 2018-05-16 DIAGNOSIS — A499 Bacterial infection, unspecified: Secondary | ICD-10-CM | POA: Diagnosis not present

## 2018-05-18 ENCOUNTER — Other Ambulatory Visit: Payer: Medicare Other

## 2018-05-18 ENCOUNTER — Other Ambulatory Visit: Payer: Self-pay | Admitting: Family Medicine

## 2018-05-18 DIAGNOSIS — N186 End stage renal disease: Secondary | ICD-10-CM | POA: Diagnosis not present

## 2018-05-18 DIAGNOSIS — A499 Bacterial infection, unspecified: Secondary | ICD-10-CM | POA: Diagnosis not present

## 2018-05-18 DIAGNOSIS — M4807 Spinal stenosis, lumbosacral region: Secondary | ICD-10-CM | POA: Diagnosis not present

## 2018-05-18 DIAGNOSIS — D509 Iron deficiency anemia, unspecified: Secondary | ICD-10-CM | POA: Diagnosis not present

## 2018-05-18 DIAGNOSIS — N2581 Secondary hyperparathyroidism of renal origin: Secondary | ICD-10-CM | POA: Diagnosis not present

## 2018-05-18 DIAGNOSIS — E1129 Type 2 diabetes mellitus with other diabetic kidney complication: Secondary | ICD-10-CM | POA: Diagnosis not present

## 2018-05-18 MED FILL — FUROSEMIDE 80 MG TAB: 80 | 30 days supply | Qty: 60 | Fill #0

## 2018-05-18 MED FILL — traMADol HCL 50 MG TABS: 50 | 7 days supply | Qty: 14 | Fill #1

## 2018-05-18 MED FILL — AMLODIPINE 2.5 MG TABLET: 2.5 | 30 days supply | Qty: 30 | Fill #4

## 2018-05-18 MED FILL — GABAPENTIN 300 MG CAPSULE: 300 | 30 days supply | Qty: 30 | Fill #3

## 2018-05-20 DIAGNOSIS — Z992 Dependence on renal dialysis: Secondary | ICD-10-CM | POA: Diagnosis not present

## 2018-05-20 DIAGNOSIS — N186 End stage renal disease: Secondary | ICD-10-CM | POA: Diagnosis not present

## 2018-05-20 DIAGNOSIS — E1122 Type 2 diabetes mellitus with diabetic chronic kidney disease: Secondary | ICD-10-CM | POA: Diagnosis not present

## 2018-05-21 DIAGNOSIS — N2581 Secondary hyperparathyroidism of renal origin: Secondary | ICD-10-CM | POA: Diagnosis not present

## 2018-05-21 DIAGNOSIS — E1129 Type 2 diabetes mellitus with other diabetic kidney complication: Secondary | ICD-10-CM | POA: Diagnosis not present

## 2018-05-21 DIAGNOSIS — N186 End stage renal disease: Secondary | ICD-10-CM | POA: Diagnosis not present

## 2018-05-21 DIAGNOSIS — D631 Anemia in chronic kidney disease: Secondary | ICD-10-CM | POA: Diagnosis not present

## 2018-05-22 DIAGNOSIS — N186 End stage renal disease: Secondary | ICD-10-CM | POA: Diagnosis not present

## 2018-05-22 DIAGNOSIS — M5440 Lumbago with sciatica, unspecified side: Secondary | ICD-10-CM | POA: Diagnosis not present

## 2018-05-22 DIAGNOSIS — M48061 Spinal stenosis, lumbar region without neurogenic claudication: Secondary | ICD-10-CM | POA: Diagnosis not present

## 2018-05-22 DIAGNOSIS — I120 Hypertensive chronic kidney disease with stage 5 chronic kidney disease or end stage renal disease: Secondary | ICD-10-CM | POA: Diagnosis not present

## 2018-05-22 DIAGNOSIS — E1140 Type 2 diabetes mellitus with diabetic neuropathy, unspecified: Secondary | ICD-10-CM | POA: Diagnosis not present

## 2018-05-22 DIAGNOSIS — I2510 Atherosclerotic heart disease of native coronary artery without angina pectoris: Secondary | ICD-10-CM | POA: Diagnosis not present

## 2018-05-23 DIAGNOSIS — E1129 Type 2 diabetes mellitus with other diabetic kidney complication: Secondary | ICD-10-CM | POA: Diagnosis not present

## 2018-05-23 DIAGNOSIS — N186 End stage renal disease: Secondary | ICD-10-CM | POA: Diagnosis not present

## 2018-05-23 DIAGNOSIS — N2581 Secondary hyperparathyroidism of renal origin: Secondary | ICD-10-CM | POA: Diagnosis not present

## 2018-05-23 DIAGNOSIS — D631 Anemia in chronic kidney disease: Secondary | ICD-10-CM | POA: Diagnosis not present

## 2018-05-25 ENCOUNTER — Ambulatory Visit
Admission: RE | Admit: 2018-05-25 | Discharge: 2018-05-25 | Disposition: A | Discharge status: Home / Self Care | Payer: Medicare Other | Source: Ambulatory Visit | Attending: Nephrology | Admitting: Nephrology

## 2018-05-25 ENCOUNTER — Other Ambulatory Visit: Payer: Self-pay | Admitting: Nephrology

## 2018-05-25 DIAGNOSIS — N6320 Unspecified lump in the left breast, unspecified quadrant: Secondary | ICD-10-CM

## 2018-05-25 DIAGNOSIS — N62 Hypertrophy of breast: Secondary | ICD-10-CM | POA: Diagnosis not present

## 2018-05-25 DIAGNOSIS — R928 Other abnormal and inconclusive findings on diagnostic imaging of breast: Secondary | ICD-10-CM | POA: Diagnosis not present

## 2018-05-26 DIAGNOSIS — N2581 Secondary hyperparathyroidism of renal origin: Secondary | ICD-10-CM | POA: Diagnosis not present

## 2018-05-26 DIAGNOSIS — E1129 Type 2 diabetes mellitus with other diabetic kidney complication: Secondary | ICD-10-CM | POA: Diagnosis not present

## 2018-05-26 DIAGNOSIS — N186 End stage renal disease: Secondary | ICD-10-CM | POA: Diagnosis not present

## 2018-05-26 DIAGNOSIS — D631 Anemia in chronic kidney disease: Secondary | ICD-10-CM | POA: Diagnosis not present

## 2018-05-28 DIAGNOSIS — N2581 Secondary hyperparathyroidism of renal origin: Secondary | ICD-10-CM | POA: Diagnosis not present

## 2018-05-28 DIAGNOSIS — D631 Anemia in chronic kidney disease: Secondary | ICD-10-CM | POA: Diagnosis not present

## 2018-05-28 DIAGNOSIS — N186 End stage renal disease: Secondary | ICD-10-CM | POA: Diagnosis not present

## 2018-05-28 DIAGNOSIS — E1129 Type 2 diabetes mellitus with other diabetic kidney complication: Secondary | ICD-10-CM | POA: Diagnosis not present

## 2018-05-30 DIAGNOSIS — N186 End stage renal disease: Secondary | ICD-10-CM | POA: Diagnosis not present

## 2018-05-30 DIAGNOSIS — N2581 Secondary hyperparathyroidism of renal origin: Secondary | ICD-10-CM | POA: Diagnosis not present

## 2018-05-30 DIAGNOSIS — E1129 Type 2 diabetes mellitus with other diabetic kidney complication: Secondary | ICD-10-CM | POA: Diagnosis not present

## 2018-05-30 DIAGNOSIS — D631 Anemia in chronic kidney disease: Secondary | ICD-10-CM | POA: Diagnosis not present

## 2018-06-01 DIAGNOSIS — E113512 Type 2 diabetes mellitus with proliferative diabetic retinopathy with macular edema, left eye: Secondary | ICD-10-CM | POA: Diagnosis not present

## 2018-06-01 MED FILL — traMADol HCL 50 MG TABS: 50 | 6 days supply | Qty: 12 | Fill #2

## 2018-06-02 DIAGNOSIS — E1129 Type 2 diabetes mellitus with other diabetic kidney complication: Secondary | ICD-10-CM | POA: Diagnosis not present

## 2018-06-02 DIAGNOSIS — N186 End stage renal disease: Secondary | ICD-10-CM | POA: Diagnosis not present

## 2018-06-02 DIAGNOSIS — D631 Anemia in chronic kidney disease: Secondary | ICD-10-CM | POA: Diagnosis not present

## 2018-06-02 DIAGNOSIS — N2581 Secondary hyperparathyroidism of renal origin: Secondary | ICD-10-CM | POA: Diagnosis not present

## 2018-06-04 DIAGNOSIS — N186 End stage renal disease: Secondary | ICD-10-CM | POA: Diagnosis not present

## 2018-06-04 DIAGNOSIS — D631 Anemia in chronic kidney disease: Secondary | ICD-10-CM | POA: Diagnosis not present

## 2018-06-04 DIAGNOSIS — N2581 Secondary hyperparathyroidism of renal origin: Secondary | ICD-10-CM | POA: Diagnosis not present

## 2018-06-04 DIAGNOSIS — E1129 Type 2 diabetes mellitus with other diabetic kidney complication: Secondary | ICD-10-CM | POA: Diagnosis not present

## 2018-06-06 DIAGNOSIS — N2581 Secondary hyperparathyroidism of renal origin: Secondary | ICD-10-CM | POA: Diagnosis not present

## 2018-06-06 DIAGNOSIS — N186 End stage renal disease: Secondary | ICD-10-CM | POA: Diagnosis not present

## 2018-06-06 DIAGNOSIS — E1129 Type 2 diabetes mellitus with other diabetic kidney complication: Secondary | ICD-10-CM | POA: Diagnosis not present

## 2018-06-06 DIAGNOSIS — D631 Anemia in chronic kidney disease: Secondary | ICD-10-CM | POA: Diagnosis not present

## 2018-06-09 DIAGNOSIS — N2581 Secondary hyperparathyroidism of renal origin: Secondary | ICD-10-CM | POA: Diagnosis not present

## 2018-06-09 DIAGNOSIS — E1129 Type 2 diabetes mellitus with other diabetic kidney complication: Secondary | ICD-10-CM | POA: Diagnosis not present

## 2018-06-09 DIAGNOSIS — D631 Anemia in chronic kidney disease: Secondary | ICD-10-CM | POA: Diagnosis not present

## 2018-06-09 DIAGNOSIS — N186 End stage renal disease: Secondary | ICD-10-CM | POA: Diagnosis not present

## 2018-06-10 ENCOUNTER — Other Ambulatory Visit: Payer: Self-pay | Admitting: Family Medicine

## 2018-06-10 DIAGNOSIS — M4646 Discitis, unspecified, lumbar region: Secondary | ICD-10-CM

## 2018-06-10 MED FILL — TAMSULOSIN HCL 0.4 MG CAP: 0.4 | 30 days supply | Qty: 30 | Fill #1

## 2018-06-11 DIAGNOSIS — D631 Anemia in chronic kidney disease: Secondary | ICD-10-CM | POA: Diagnosis not present

## 2018-06-11 DIAGNOSIS — E1129 Type 2 diabetes mellitus with other diabetic kidney complication: Secondary | ICD-10-CM | POA: Diagnosis not present

## 2018-06-11 DIAGNOSIS — N2581 Secondary hyperparathyroidism of renal origin: Secondary | ICD-10-CM | POA: Diagnosis not present

## 2018-06-11 DIAGNOSIS — N186 End stage renal disease: Secondary | ICD-10-CM | POA: Diagnosis not present

## 2018-06-11 MED FILL — traMADol HCL 50 MG TABS: 50 | 20 days supply | Qty: 40 | Fill #0

## 2018-06-11 NOTE — Telephone Encounter (Signed)
Pt last seen: 04/20/18 Next appt: 07/20/18 Last RX written on: 04/20/18 Date of original fill: 04/20/18 (#14tabs/7ds) Date of refill(s): 05/18/18 (#14tabs/7ds), 06/01/18 (#12tabs/6ds)  No other controlled substances were filled during this time, please refill if appropriate.

## 2018-06-13 DIAGNOSIS — D631 Anemia in chronic kidney disease: Secondary | ICD-10-CM | POA: Diagnosis not present

## 2018-06-13 DIAGNOSIS — N186 End stage renal disease: Secondary | ICD-10-CM | POA: Diagnosis not present

## 2018-06-13 DIAGNOSIS — E1129 Type 2 diabetes mellitus with other diabetic kidney complication: Secondary | ICD-10-CM | POA: Diagnosis not present

## 2018-06-13 DIAGNOSIS — N2581 Secondary hyperparathyroidism of renal origin: Secondary | ICD-10-CM | POA: Diagnosis not present

## 2018-06-15 MED FILL — GABAPENTIN 300 MG CAPSULE: 300 | 30 days supply | Qty: 30 | Fill #4

## 2018-06-16 DIAGNOSIS — D631 Anemia in chronic kidney disease: Secondary | ICD-10-CM | POA: Diagnosis not present

## 2018-06-16 DIAGNOSIS — N2581 Secondary hyperparathyroidism of renal origin: Secondary | ICD-10-CM | POA: Diagnosis not present

## 2018-06-16 DIAGNOSIS — N186 End stage renal disease: Secondary | ICD-10-CM | POA: Diagnosis not present

## 2018-06-16 DIAGNOSIS — E1129 Type 2 diabetes mellitus with other diabetic kidney complication: Secondary | ICD-10-CM | POA: Diagnosis not present

## 2018-06-18 DIAGNOSIS — N186 End stage renal disease: Secondary | ICD-10-CM | POA: Diagnosis not present

## 2018-06-18 DIAGNOSIS — E1129 Type 2 diabetes mellitus with other diabetic kidney complication: Secondary | ICD-10-CM | POA: Diagnosis not present

## 2018-06-18 DIAGNOSIS — N2581 Secondary hyperparathyroidism of renal origin: Secondary | ICD-10-CM | POA: Diagnosis not present

## 2018-06-18 DIAGNOSIS — D631 Anemia in chronic kidney disease: Secondary | ICD-10-CM | POA: Diagnosis not present

## 2018-06-20 DIAGNOSIS — N2581 Secondary hyperparathyroidism of renal origin: Secondary | ICD-10-CM | POA: Diagnosis not present

## 2018-06-20 DIAGNOSIS — Z992 Dependence on renal dialysis: Secondary | ICD-10-CM | POA: Diagnosis not present

## 2018-06-20 DIAGNOSIS — E1122 Type 2 diabetes mellitus with diabetic chronic kidney disease: Secondary | ICD-10-CM | POA: Diagnosis not present

## 2018-06-20 DIAGNOSIS — D631 Anemia in chronic kidney disease: Secondary | ICD-10-CM | POA: Diagnosis not present

## 2018-06-20 DIAGNOSIS — E1129 Type 2 diabetes mellitus with other diabetic kidney complication: Secondary | ICD-10-CM | POA: Diagnosis not present

## 2018-06-20 DIAGNOSIS — N186 End stage renal disease: Secondary | ICD-10-CM | POA: Diagnosis not present

## 2018-06-22 MED FILL — FUROSEMIDE 80 MG TAB: 80 | 30 days supply | Qty: 60 | Fill #1

## 2018-06-23 DIAGNOSIS — N186 End stage renal disease: Secondary | ICD-10-CM | POA: Diagnosis not present

## 2018-06-23 DIAGNOSIS — E1129 Type 2 diabetes mellitus with other diabetic kidney complication: Secondary | ICD-10-CM | POA: Diagnosis not present

## 2018-06-23 DIAGNOSIS — D631 Anemia in chronic kidney disease: Secondary | ICD-10-CM | POA: Diagnosis not present

## 2018-06-23 DIAGNOSIS — N2581 Secondary hyperparathyroidism of renal origin: Secondary | ICD-10-CM | POA: Diagnosis not present

## 2018-06-25 DIAGNOSIS — D631 Anemia in chronic kidney disease: Secondary | ICD-10-CM | POA: Diagnosis not present

## 2018-06-25 DIAGNOSIS — E1129 Type 2 diabetes mellitus with other diabetic kidney complication: Secondary | ICD-10-CM | POA: Diagnosis not present

## 2018-06-25 DIAGNOSIS — N186 End stage renal disease: Secondary | ICD-10-CM | POA: Diagnosis not present

## 2018-06-25 DIAGNOSIS — N2581 Secondary hyperparathyroidism of renal origin: Secondary | ICD-10-CM | POA: Diagnosis not present

## 2018-06-27 DIAGNOSIS — N2581 Secondary hyperparathyroidism of renal origin: Secondary | ICD-10-CM | POA: Diagnosis not present

## 2018-06-27 DIAGNOSIS — E1129 Type 2 diabetes mellitus with other diabetic kidney complication: Secondary | ICD-10-CM | POA: Diagnosis not present

## 2018-06-27 DIAGNOSIS — N186 End stage renal disease: Secondary | ICD-10-CM | POA: Diagnosis not present

## 2018-06-27 DIAGNOSIS — D631 Anemia in chronic kidney disease: Secondary | ICD-10-CM | POA: Diagnosis not present

## 2018-06-30 DIAGNOSIS — D631 Anemia in chronic kidney disease: Secondary | ICD-10-CM | POA: Diagnosis not present

## 2018-06-30 DIAGNOSIS — N186 End stage renal disease: Secondary | ICD-10-CM | POA: Diagnosis not present

## 2018-06-30 DIAGNOSIS — N2581 Secondary hyperparathyroidism of renal origin: Secondary | ICD-10-CM | POA: Diagnosis not present

## 2018-06-30 DIAGNOSIS — E1129 Type 2 diabetes mellitus with other diabetic kidney complication: Secondary | ICD-10-CM | POA: Diagnosis not present

## 2018-07-02 DIAGNOSIS — D631 Anemia in chronic kidney disease: Secondary | ICD-10-CM | POA: Diagnosis not present

## 2018-07-02 DIAGNOSIS — N2581 Secondary hyperparathyroidism of renal origin: Secondary | ICD-10-CM | POA: Diagnosis not present

## 2018-07-02 DIAGNOSIS — N186 End stage renal disease: Secondary | ICD-10-CM | POA: Diagnosis not present

## 2018-07-02 DIAGNOSIS — E1129 Type 2 diabetes mellitus with other diabetic kidney complication: Secondary | ICD-10-CM | POA: Diagnosis not present

## 2018-07-04 DIAGNOSIS — D631 Anemia in chronic kidney disease: Secondary | ICD-10-CM | POA: Diagnosis not present

## 2018-07-04 DIAGNOSIS — N186 End stage renal disease: Secondary | ICD-10-CM | POA: Diagnosis not present

## 2018-07-04 DIAGNOSIS — E1129 Type 2 diabetes mellitus with other diabetic kidney complication: Secondary | ICD-10-CM | POA: Diagnosis not present

## 2018-07-04 DIAGNOSIS — N2581 Secondary hyperparathyroidism of renal origin: Secondary | ICD-10-CM | POA: Diagnosis not present

## 2018-07-06 MED FILL — AMLODIPINE 2.5 MG TABLET: 2.5 | 30 days supply | Qty: 30 | Fill #5

## 2018-07-06 MED FILL — ATORVASTATIN CALCIUM 40 MG: 40 | 30 days supply | Qty: 30 | Fill #0

## 2018-07-07 DIAGNOSIS — N2581 Secondary hyperparathyroidism of renal origin: Secondary | ICD-10-CM | POA: Diagnosis not present

## 2018-07-07 DIAGNOSIS — D631 Anemia in chronic kidney disease: Secondary | ICD-10-CM | POA: Diagnosis not present

## 2018-07-07 DIAGNOSIS — N186 End stage renal disease: Secondary | ICD-10-CM | POA: Diagnosis not present

## 2018-07-07 DIAGNOSIS — E1129 Type 2 diabetes mellitus with other diabetic kidney complication: Secondary | ICD-10-CM | POA: Diagnosis not present

## 2018-07-09 DIAGNOSIS — N2581 Secondary hyperparathyroidism of renal origin: Secondary | ICD-10-CM | POA: Diagnosis not present

## 2018-07-09 DIAGNOSIS — D631 Anemia in chronic kidney disease: Secondary | ICD-10-CM | POA: Diagnosis not present

## 2018-07-09 DIAGNOSIS — N186 End stage renal disease: Secondary | ICD-10-CM | POA: Diagnosis not present

## 2018-07-09 DIAGNOSIS — E1129 Type 2 diabetes mellitus with other diabetic kidney complication: Secondary | ICD-10-CM | POA: Diagnosis not present

## 2018-07-11 DIAGNOSIS — N2581 Secondary hyperparathyroidism of renal origin: Secondary | ICD-10-CM | POA: Diagnosis not present

## 2018-07-11 DIAGNOSIS — E1129 Type 2 diabetes mellitus with other diabetic kidney complication: Secondary | ICD-10-CM | POA: Diagnosis not present

## 2018-07-11 DIAGNOSIS — D631 Anemia in chronic kidney disease: Secondary | ICD-10-CM | POA: Diagnosis not present

## 2018-07-11 DIAGNOSIS — N186 End stage renal disease: Secondary | ICD-10-CM | POA: Diagnosis not present

## 2018-07-14 DIAGNOSIS — N186 End stage renal disease: Secondary | ICD-10-CM | POA: Diagnosis not present

## 2018-07-14 DIAGNOSIS — D631 Anemia in chronic kidney disease: Secondary | ICD-10-CM | POA: Diagnosis not present

## 2018-07-14 DIAGNOSIS — N2581 Secondary hyperparathyroidism of renal origin: Secondary | ICD-10-CM | POA: Diagnosis not present

## 2018-07-14 DIAGNOSIS — E1129 Type 2 diabetes mellitus with other diabetic kidney complication: Secondary | ICD-10-CM | POA: Diagnosis not present

## 2018-07-16 DIAGNOSIS — E1129 Type 2 diabetes mellitus with other diabetic kidney complication: Secondary | ICD-10-CM | POA: Diagnosis not present

## 2018-07-16 DIAGNOSIS — N186 End stage renal disease: Secondary | ICD-10-CM | POA: Diagnosis not present

## 2018-07-16 DIAGNOSIS — D631 Anemia in chronic kidney disease: Secondary | ICD-10-CM | POA: Diagnosis not present

## 2018-07-16 DIAGNOSIS — N2581 Secondary hyperparathyroidism of renal origin: Secondary | ICD-10-CM | POA: Diagnosis not present

## 2018-07-18 DIAGNOSIS — N2581 Secondary hyperparathyroidism of renal origin: Secondary | ICD-10-CM | POA: Diagnosis not present

## 2018-07-18 DIAGNOSIS — N186 End stage renal disease: Secondary | ICD-10-CM | POA: Diagnosis not present

## 2018-07-18 DIAGNOSIS — D631 Anemia in chronic kidney disease: Secondary | ICD-10-CM | POA: Diagnosis not present

## 2018-07-18 DIAGNOSIS — E1129 Type 2 diabetes mellitus with other diabetic kidney complication: Secondary | ICD-10-CM | POA: Diagnosis not present

## 2018-07-19 DIAGNOSIS — E1122 Type 2 diabetes mellitus with diabetic chronic kidney disease: Secondary | ICD-10-CM | POA: Diagnosis not present

## 2018-07-19 DIAGNOSIS — Z992 Dependence on renal dialysis: Secondary | ICD-10-CM | POA: Diagnosis not present

## 2018-07-19 DIAGNOSIS — N186 End stage renal disease: Secondary | ICD-10-CM | POA: Diagnosis not present

## 2018-07-20 ENCOUNTER — Encounter: Payer: Self-pay | Admitting: Family Medicine

## 2018-07-20 ENCOUNTER — Ambulatory Visit: Payer: Medicare Other | Attending: Family Medicine | Admitting: Family Medicine

## 2018-07-20 ENCOUNTER — Other Ambulatory Visit: Payer: Self-pay

## 2018-07-20 ENCOUNTER — Other Ambulatory Visit: Payer: Self-pay | Admitting: Family Medicine

## 2018-07-20 VITALS — BP 148/58 | HR 47 | Temp 97.7°F | Ht 71.0 in | Wt 149.2 lb

## 2018-07-20 DIAGNOSIS — Z833 Family history of diabetes mellitus: Secondary | ICD-10-CM | POA: Diagnosis not present

## 2018-07-20 DIAGNOSIS — Z7982 Long term (current) use of aspirin: Secondary | ICD-10-CM | POA: Diagnosis not present

## 2018-07-20 DIAGNOSIS — N186 End stage renal disease: Secondary | ICD-10-CM

## 2018-07-20 DIAGNOSIS — Z794 Long term (current) use of insulin: Secondary | ICD-10-CM | POA: Diagnosis not present

## 2018-07-20 DIAGNOSIS — I2510 Atherosclerotic heart disease of native coronary artery without angina pectoris: Secondary | ICD-10-CM | POA: Diagnosis not present

## 2018-07-20 DIAGNOSIS — M546 Pain in thoracic spine: Secondary | ICD-10-CM

## 2018-07-20 DIAGNOSIS — Z992 Dependence on renal dialysis: Secondary | ICD-10-CM

## 2018-07-20 DIAGNOSIS — H409 Unspecified glaucoma: Secondary | ICD-10-CM | POA: Diagnosis not present

## 2018-07-20 DIAGNOSIS — M7989 Other specified soft tissue disorders: Secondary | ICD-10-CM

## 2018-07-20 DIAGNOSIS — E1141 Type 2 diabetes mellitus with diabetic mononeuropathy: Secondary | ICD-10-CM | POA: Insufficient documentation

## 2018-07-20 DIAGNOSIS — E0841 Diabetes mellitus due to underlying condition with diabetic mononeuropathy: Secondary | ICD-10-CM | POA: Diagnosis not present

## 2018-07-20 DIAGNOSIS — E1122 Type 2 diabetes mellitus with diabetic chronic kidney disease: Secondary | ICD-10-CM | POA: Insufficient documentation

## 2018-07-20 DIAGNOSIS — I120 Hypertensive chronic kidney disease with stage 5 chronic kidney disease or end stage renal disease: Secondary | ICD-10-CM | POA: Diagnosis not present

## 2018-07-20 DIAGNOSIS — E1165 Type 2 diabetes mellitus with hyperglycemia: Secondary | ICD-10-CM | POA: Diagnosis not present

## 2018-07-20 DIAGNOSIS — I10 Essential (primary) hypertension: Secondary | ICD-10-CM | POA: Diagnosis not present

## 2018-07-20 DIAGNOSIS — E118 Type 2 diabetes mellitus with unspecified complications: Secondary | ICD-10-CM

## 2018-07-20 DIAGNOSIS — M4646 Discitis, unspecified, lumbar region: Secondary | ICD-10-CM

## 2018-07-20 DIAGNOSIS — Z79899 Other long term (current) drug therapy: Secondary | ICD-10-CM | POA: Insufficient documentation

## 2018-07-20 LAB — POCT GLYCOSYLATED HEMOGLOBIN (HGB A1C): HbA1c, POC (controlled diabetic range): 6.5 % (ref 0.0–7.0)

## 2018-07-20 LAB — GLUCOSE, POCT (MANUAL RESULT ENTRY): POC Glucose: 164 mg/dl — AB (ref 70–99)

## 2018-07-20 MED ORDER — ACCU-CHEK SOFTCLIX LANCETS MISC: 12 refills | Status: AC

## 2018-07-20 MED ORDER — GLUCOSE BLOOD VI STRP: ORAL_STRIP | 12 refills | Status: DC

## 2018-07-20 MED ORDER — ACCU-CHEK SOFTCLIX LANCETS MISC: 12 refills | Status: DC

## 2018-07-20 MED ORDER — ACCU-CHEK AVIVA DEVI: 0 refills | Status: DC

## 2018-07-20 MED ORDER — ACCU-CHEK AVIVA DEVI: 0 refills | Status: AC

## 2018-07-20 MED FILL — FUROSEMIDE 80 MG TAB: 80 | 30 days supply | Qty: 60 | Fill #2

## 2018-07-20 MED FILL — CARVEDILOL 6.25 MG TABLET: 6.25 | 15 days supply | Qty: 60 | Fill #1

## 2018-07-20 MED FILL — GABAPENTIN 300 MG CAPSULE: 300 | 30 days supply | Qty: 30 | Fill #5

## 2018-07-20 NOTE — Progress Notes (Signed)
Subjective:  Patient ID: Russell Price, male    DOB: 1961-01-11  Age: 58 y.o. MRN: [id number]  CC: Diabetes   HPI Russell Price  is a 58 year old male with a history of hypertension, type 2 diabetes mellitus (A1c 6.5), diabetic neuropathy, blind in the right eye, end-stage renal disease (currently on hemodialysis on Tuesdays, Thursdays and Saturdays) here for follow-up visit accompanied by his wife. The low back pain he previously complained of has resolved and he completed PT 1 month ago. Today he complains of reduced strength in his left upper extremity and pain in his left upper back muscles rated as a 2-3/10 for the last 3 weeks but denies neck pain, denies pain shooting down his left upper extremity, denies numbness in his extremities.  Compliant with his insulin and denies hypoglycemia.  His neuropathy is controlled. Compliant with his antihypertensive and denies adverse effects from his medications. Hemodialysis sessions are going well.  Past Medical History:  Diagnosis Date  . Back pain 03/12/2018  . CAD (coronary artery disease)    a. underwent cath in 02/2016 after an abnormal nuc which showed 2V CAD in RCA and OM3 with no good targets for PCI. Medical managment recommended.   . Chronic kidney disease   . Diabetes mellitus without complication ([place])    type II - followed by Dr Chalmers Cater   . Difficult intubation    ER intubation 2016  . ESRD (end stage renal disease) ([place])    Tues, Th, Sat [place]  . Fever 03/12/2018  . Glaucoma   . Hemodialysis patient [facility])    has been on dialysis since approx 10/2016   . Hypertension    followed by [facility]  . Stroke [facility])    patient denies on 02/19/2017     Past Surgical History:  Procedure Laterality Date  . A/V FISTULAGRAM Left 11/27/2016   Procedure: A/V Fistulagram;  Surgeon: Waynetta Sandy, MD;  Location: [place] [facility];  Service: Cardiovascular;  Laterality: Left;    . A/V FISTULAGRAM Left 05/07/2017   Procedure: A/V FISTULAGRAM;  Surgeon: Waynetta Sandy, MD;  Location: [place] CV [facility];  Service: Cardiovascular;  Laterality: Left;  . A/V FISTULAGRAM Left 07/14/2017   Procedure: A/V FISTULAGRAM;  Surgeon: Waynetta Sandy, MD;  Location: [place] CV LAB;  Service: Cardiovascular;  Laterality: Left;  . AV FISTULA PLACEMENT Left 03/12/2016   Procedure: Left Arm ARTERIOVENOUS (AV) FISTULA CREATION;  Surgeon: Waynetta Sandy, MD;  Location: [facility] OR;  Service: Vascular;  Laterality: Left;  . AV FISTULA PLACEMENT Left 07/18/2017   Procedure: INSERTION OF ARTERIOVENOUS (AV) GORE-TEX GRAFT ARM LEFT UPPER ARM;  Surgeon: Conrad Augusta, MD;  Location: [place];  Service: Vascular;  Laterality: Left;  . BASCILIC VEIN TRANSPOSITION Left 05/14/2016   Procedure: SECOND STAGE LEFT BASILIC VEIN TRANSPOSITION;  Surgeon: Waynetta Sandy, MD;  Location: [place];  Service: Vascular;  Laterality: Left;  . BRONCHOSCOPY  02/14/2015   for pulm hemorrhage  . CARDIAC CATHETERIZATION N/A 03/04/2016   Procedure: Left Heart Cath and Coronary Angiography;  Surgeon: Leonie Man, MD;  Location: [place] CV LAB;  Service: Cardiovascular;  Laterality: N/A;  . COLONOSCOPY WITH PROPOFOL N/A 04/07/2017   Procedure: COLONOSCOPY WITH PROPOFOL;  Surgeon: Ronnette Juniper, MD;  Location: [place];  Service: Gastroenterology;  Laterality: N/A;  . EYE SURGERY Left   . INSERTION OF DIALYSIS CATHETER Right 11/08/2016   Procedure: INSERTION OF DIALYSIS CATHETER;  Surgeon:  Rosetta Posner, MD;  Location: [place];  Service: Vascular;  Laterality: Right;  . IR PARACENTESIS  11/13/2016  . LIGATION OF ARTERIOVENOUS  FISTULA Left 07/18/2017   Procedure: LIGATION OF ARTERIOVENOUS  FISTULA;  Surgeon: Conrad Bruceville-Eddy, MD;  Location: [place];  Service: Vascular;  Laterality: Left;  . PERIPHERAL VASCULAR BALLOON ANGIOPLASTY Left 11/27/2016   Procedure: Peripheral Vascular Balloon  Angioplasty;  Surgeon: Waynetta Sandy, MD;  Location: [place] [facility];  Service: Cardiovascular;  Laterality: Left;  ARM FISTULA  . PERIPHERAL VASCULAR BALLOON ANGIOPLASTY Left 05/07/2017   Procedure: PERIPHERAL VASCULAR BALLOON ANGIOPLASTY;  Surgeon: Waynetta Sandy, MD;  Location: [place] [facility];  Service: Cardiovascular;  Laterality: Left;    Family History  Problem Relation Age of Onset  . Diabetes Mother     No Known Allergies  Outpatient Medications Prior to Visit  Medication Sig Dispense Refill  . Amino Acids-Protein Hydrolys (FEEDING SUPPLEMENT, PRO-STAT SUGAR FREE 64,) LIQD Take 30 mLs by mouth 2 (two) times daily. 900 mL 0  . amLODipine (NORVASC) 10 MG tablet Take 1 tablet (10 mg total) by mouth daily. 30 tablet 0  . aspirin 81 MG chewable tablet Chew 1 tablet (81 mg total) by mouth daily. (Patient taking differently: Chew 81 mg by mouth every evening. )    . atorvastatin (LIPITOR) 40 MG tablet Take 1 tablet (40 mg total) by mouth daily at 6 PM. 30 tablet 5  . b complex-vitamin c-folic acid (NEPHRO-VITE) 0.8 MG TABS tablet Take 1 tablet by mouth at bedtime.    . bisacodyl (DULCOLAX) 10 MG suppository Place 1 suppository (10 mg total) rectally daily as needed for moderate constipation. 12 suppository 0  . carvedilol (COREG) 6.25 MG tablet TAKE 2 TABLETS BY MOUTH 2 TIMES DAILY WITH A MEAL. 60 tablet 3  . furosemide (LASIX) 80 MG tablet TAKE 1 TABLET BY MOUTH 2 TIMES DAILY. 60 tablet 2  . gabapentin (NEURONTIN) 300 MG capsule Take 1 capsule (300 mg total) by mouth daily. 30 capsule 5  . insulin aspart (NOVOLOG) 100 UNIT/ML injection 0-12 units 3 times daily before meals as well as per sliding scale 10 mL 5  . insulin glargine (LANTUS) 100 UNIT/ML injection Inject subcutaneously 10 units of Lantus in the morning and 12 units of Lantus in the evening 30 mL 6  . linaclotide (LINZESS) 72 MCG capsule Take 1 capsule (72 mcg total) by mouth daily before breakfast.  30 capsule 1  . timolol (TIMOPTIC) 0.5 % ophthalmic solution Place 1 drop into the left eye 2 (two) times daily.    . traMADol (ULTRAM) 50 MG tablet TAKE 1 TABLET (50 MG TOTAL) BY MOUTH EVERY 12 (TWELVE) HOURS AS NEEDED. 40 tablet 0  . ACCU-CHEK SOFTCLIX LANCETS lancets Use as instructed 3 times daily before meals and at bedtime. E11.9 100 each 12  . Blood Glucose Monitoring Suppl (TRUE METRIX METER) DEVI 1 each by Does not apply route 3 (three) times daily. 1 Device 0  . calcitRIOL (ROCALTROL) 0.25 MCG capsule Take 1 capsule (0.25 mcg total) by mouth daily. (Patient not taking: Reported on 05/04/2018) 30 capsule 6  . cyclobenzaprine (FLEXERIL) 10 MG tablet Take 1 tablet (10 mg total) by mouth 3 (three) times daily as needed for up to 30 doses for muscle spasms. (Patient not taking: Reported on 05/04/2018) 30 tablet 0  . Darbepoetin Alfa (ARANESP) 150 MCG/0.3ML SOSY injection Inject 0.3 mLs (150 mcg total) into the vein every Saturday with hemodialysis. (  Patient not taking: Reported on 07/20/2018) 1.68 mL 0  . hydrocortisone (ANUSOL-HC) 2.5 % rectal cream Place rectally 2 (two) times daily. (Patient not taking: Reported on 07/20/2018) 30 g 0  . Lancet Devices (ACCU-CHEK SOFTCLIX) lancets Use as instructed 3 times daily before meals and at bedtime. E11.9 1 each 12  . Misc. Devices MISC Tub transfer bench. Dx: acute discitis and low back pain (Patient not taking: Reported on 07/20/2018) 1 each 0  . Nutritional Supplements (FEEDING SUPPLEMENT, NEPRO CARB STEADY,) LIQD Take 237 mLs by mouth 2 (two) times daily between meals. (Patient not taking: Reported on 07/20/2018) 60 Can 0  . ondansetron (ZOFRAN) 4 MG tablet Take 1 tablet (4 mg total) by mouth every 8 (eight) hours as needed for nausea or vomiting. (Patient not taking: Reported on 05/04/2018) 30 tablet 1  . polyethylene glycol (MIRALAX / GLYCOLAX) packet Take 17 g by mouth daily. (Patient not taking: Reported on 07/20/2018) 30 each 1  . RENAGEL 800 MG tablet  Take 800-1,600 mg by mouth See admin instructions. Take 1600 mg by mouth 3 times daily with meals and take 800 mg by mouth twice daily with snacks    . tamsulosin (FLOMAX) 0.4 MG CAPS capsule Take 1 capsule (0.4 mg total) by mouth daily. (Patient not taking: Reported on 05/04/2018) 30 capsule 3  . Blood Glucose Monitoring Suppl (ACCU-CHEK AVIVA) device Use as instructed 3 times daily before meals and at bedtime. E11.9 1 each 0  . glucose blood (ACCU-CHEK AVIVA PLUS) test strip Use 3 times daily before meals and at bedtime. E11.9 100 each 12   No facility-administered medications prior to visit.      ROS Review of Systems  Constitutional: Negative for activity change and appetite change.  HENT: Negative for sinus pressure and sore throat.   Eyes: Negative for visual disturbance.  Respiratory: Negative for cough, chest tightness and shortness of breath.   Cardiovascular: Negative for chest pain and leg swelling.  Gastrointestinal: Negative for abdominal distention, abdominal pain, constipation and diarrhea.  Endocrine: Negative.   Genitourinary: Negative for dysuria.  Musculoskeletal: Negative for joint swelling and myalgias.       See hpi  Skin: Negative for rash.  Allergic/Immunologic: Negative.   Neurological: Negative for weakness, light-headedness and numbness.  Psychiatric/Behavioral: Negative for dysphoric mood and suicidal ideas.    Objective:  BP (!) 148/58   Pulse (!) 47   Temp 97.7 F (36.5 C) (Oral)   Ht 5\' 11"  (1.803 m)   Wt 149 lb 3.2 oz (67.7 kg)   SpO2 100%   BMI 20.81 kg/m   BP/Weight 07/20/2018 05/04/2018 61/08/4313  Systolic BP 400 867 619  Diastolic BP 58 84 75  Wt. (Lbs) 149.2 150 154.6  BMI 20.81 20.92 21.56      Physical Exam Constitutional:      Appearance: He is well-developed.  Cardiovascular:     Rate and Rhythm: Bradycardia present.     Heart sounds: Normal heart sounds. No murmur.  Pulmonary:     Effort: Pulmonary effort is normal.      Breath sounds: Normal breath sounds. No wheezing or rales.  Chest:     Chest wall: No tenderness.  Abdominal:     General: Bowel sounds are normal. There is no distension.     Palpations: Abdomen is soft. There is no mass.     Tenderness: There is no abdominal tenderness.  Musculoskeletal: Normal range of motion.     Comments: Left upper extremity edema,  no TTP of left upper etremity Left upper arm AV fistula with palpable thrill Right upper extremities normal.  Neurological:     Mental Status: He is alert and oriented to person, place, and time.     Comments: Handgrip left hand-3+/5; right hand-5/5     CMP Latest Ref Rng & Units 04/06/2018 03/21/2018 03/20/2018  Glucose 65 - 99 mg/dL 147(H) 203(H) 136(H)  BUN 7 - 25 mg/dL 40(H) 24(H) 40(H)  Creatinine 0.70 - 1.33 mg/dL 6.11(H) 4.92(H) 7.20(H)  Sodium 135 - 146 mmol/L 134(L) 130(L) 127(L)  Potassium 3.5 - 5.3 mmol/L 4.1 3.8 4.1  Chloride 98 - 110 mmol/L 97(L) 95(L) 90(L)  CO2 20 - 32 mmol/L 26 28 27   Calcium 8.6 - 10.3 mg/dL 10.2 8.7(L) 9.0  Total Protein 6.1 - 8.1 g/dL 8.1 - -  Total Bilirubin 0.2 - 1.2 mg/dL 0.3 - -  Alkaline Phos 38 - 126 U/L - - -  AST 10 - 35 U/L 17 - -  ALT 9 - 46 U/L <3(L) - -    Lipid Panel     Component Value Date/Time   CHOL 92 (L) 10/09/2015 0951   TRIG 49 10/09/2015 0951   HDL 38 (L) 10/09/2015 0951   CHOLHDL 2.4 10/09/2015 0951   VLDL 10 10/09/2015 0951   LDLCALC 44 10/09/2015 0951    CBC    Component Value Date/Time   WBC 6.5 04/06/2018 1644   RBC 3.31 (L) 04/06/2018 1644   HGB 10.1 (L) 04/06/2018 1644   HCT 30.7 (L) 04/06/2018 1644   PLT 224 04/06/2018 1644   MCV 92.7 04/06/2018 1644   MCV 85.0 02/27/2015 1458   MCH 30.5 04/06/2018 1644   MCHC 32.9 04/06/2018 1644   RDW 13.5 04/06/2018 1644   LYMPHSABS 1.8 07/21/2017 2051   MONOABS 0.4 07/21/2017 2051   EOSABS 0.4 07/21/2017 2051   BASOSABS 0.0 07/21/2017 2051    Lab Results  Component Value Date   HGBA1C 6.5 07/20/2018     Assessment & Plan:   1. DM (diabetes mellitus) with complications ([place]) Controlled with A1c of 6.5 Discussed hypoglycemia management; he knows to reduce his insulin by 2 units if blood sugars are running low - POCT glucose (manual entry) - POCT glycosylated hemoglobin (Hb A1C) - glucose blood (ACCU-CHEK AVIVA PLUS) test strip; Use 3 times daily before meals and at bedtime. E11.9  Dispense: 100 each; Refill: 12 - Blood Glucose Monitoring Suppl (ACCU-CHEK AVIVA) device; Use as instructed 3 times daily before meals and at bedtime. E11.9  Dispense: 1 each; Refill: 0 - ACCU-CHEK SOFTCLIX LANCETS lancets; Use as instructed 3 times daily before meals and at bedtime. E11.9  Dispense: 100 each; Refill: 12  2. Left arm swelling Will need to exclude DVT - VAS [place] UPPER EXTREMITY VENOUS DUPLEX; Future - Ambulatory referral to Physical Therapy  3. Acute left-sided thoracic back pain Likely muscle spasm but I am concerned about his significantly reduced left hand grip Continue Flexeril Referred to PT Consider neck imaging if symptoms persist - VAS [place] UPPER EXTREMITY VENOUS DUPLEX; Future  4. Diabetic mononeuropathy associated with diabetes mellitus due to underlying condition ([place]) Currently on gabapentin  5. ESRD (end stage renal disease) ([place]) Hemodialysis as per schedule  6. Essential hypertension Slightly elevated No regimen change today to prevent hypotension during hemodialysis   Meds ordered this encounter  Medications  . glucose blood (ACCU-CHEK AVIVA PLUS) test strip    Sig: Use 3 times daily before meals and at bedtime.  E11.9    Dispense:  100 each    Refill:  12    E11.9  . Blood Glucose Monitoring Suppl (ACCU-CHEK AVIVA) device    Sig: Use as instructed 3 times daily before meals and at bedtime. E11.9    Dispense:  1 each    Refill:  0  . ACCU-CHEK SOFTCLIX LANCETS lancets    Sig: Use as instructed 3 times daily before meals and at bedtime. E11.9    Dispense:  100  each    Refill:  12    Follow-up: Return in about 3 months (around 10/20/2018) for follow up of chronic medical conditions.       Charlott Rakes, MD, FAAFP. [facility] and [place], [place]   07/20/2018, 10:29 AM

## 2018-07-21 ENCOUNTER — Encounter: Payer: Self-pay | Admitting: Family Medicine

## 2018-07-21 DIAGNOSIS — N186 End stage renal disease: Secondary | ICD-10-CM | POA: Diagnosis not present

## 2018-07-21 DIAGNOSIS — D631 Anemia in chronic kidney disease: Secondary | ICD-10-CM | POA: Diagnosis not present

## 2018-07-21 DIAGNOSIS — N2581 Secondary hyperparathyroidism of renal origin: Secondary | ICD-10-CM | POA: Diagnosis not present

## 2018-07-21 DIAGNOSIS — D509 Iron deficiency anemia, unspecified: Secondary | ICD-10-CM | POA: Diagnosis not present

## 2018-07-21 NOTE — Telephone Encounter (Signed)
Pt last seen: 07/20/18 Next appt: 11/02/18 Last RX written on: 06/11/18 Date of original fill: 06/11/18 Date of refill(s): n/a  No other controlled substances were filled during this time, please refill if appropriate.

## 2018-07-22 ENCOUNTER — Encounter (HOSPITAL_COMMUNITY): Payer: Medicare Other

## 2018-07-23 DIAGNOSIS — N186 End stage renal disease: Secondary | ICD-10-CM | POA: Diagnosis not present

## 2018-07-23 DIAGNOSIS — D631 Anemia in chronic kidney disease: Secondary | ICD-10-CM | POA: Diagnosis not present

## 2018-07-23 DIAGNOSIS — N2581 Secondary hyperparathyroidism of renal origin: Secondary | ICD-10-CM | POA: Diagnosis not present

## 2018-07-23 DIAGNOSIS — D509 Iron deficiency anemia, unspecified: Secondary | ICD-10-CM | POA: Diagnosis not present

## 2018-07-25 DIAGNOSIS — N186 End stage renal disease: Secondary | ICD-10-CM | POA: Diagnosis not present

## 2018-07-25 DIAGNOSIS — N2581 Secondary hyperparathyroidism of renal origin: Secondary | ICD-10-CM | POA: Diagnosis not present

## 2018-07-25 DIAGNOSIS — D509 Iron deficiency anemia, unspecified: Secondary | ICD-10-CM | POA: Diagnosis not present

## 2018-07-25 DIAGNOSIS — D631 Anemia in chronic kidney disease: Secondary | ICD-10-CM | POA: Diagnosis not present

## 2018-07-27 ENCOUNTER — Ambulatory Visit (HOSPITAL_COMMUNITY)
Admission: RE | Admit: 2018-07-27 | Discharge: 2018-07-27 | Disposition: A | Discharge status: Home / Self Care | Payer: Medicare Other | Source: Ambulatory Visit | Attending: Family Medicine | Admitting: Family Medicine

## 2018-07-27 ENCOUNTER — Other Ambulatory Visit: Payer: Self-pay

## 2018-07-27 DIAGNOSIS — M7989 Other specified soft tissue disorders: Secondary | ICD-10-CM | POA: Diagnosis not present

## 2018-07-27 DIAGNOSIS — E113512 Type 2 diabetes mellitus with proliferative diabetic retinopathy with macular edema, left eye: Secondary | ICD-10-CM | POA: Diagnosis not present

## 2018-07-27 NOTE — Progress Notes (Signed)
VASCULAR LAB PRELIMINARY  PRELIMINARY  PRELIMINARY  PRELIMINARY  Left upper extremity venous duplex completed.    Preliminary report:  See CV proc for results  Attempted to call results 3 times.  No one answers.Sent message through Epic to Dr. Griffith Citron, [facility], RVT 07/27/2018, 3:17 PM

## 2018-07-28 DIAGNOSIS — N186 End stage renal disease: Secondary | ICD-10-CM | POA: Diagnosis not present

## 2018-07-28 DIAGNOSIS — D509 Iron deficiency anemia, unspecified: Secondary | ICD-10-CM | POA: Diagnosis not present

## 2018-07-28 DIAGNOSIS — D631 Anemia in chronic kidney disease: Secondary | ICD-10-CM | POA: Diagnosis not present

## 2018-07-28 DIAGNOSIS — N2581 Secondary hyperparathyroidism of renal origin: Secondary | ICD-10-CM | POA: Diagnosis not present

## 2018-07-30 DIAGNOSIS — D631 Anemia in chronic kidney disease: Secondary | ICD-10-CM | POA: Diagnosis not present

## 2018-07-30 DIAGNOSIS — N186 End stage renal disease: Secondary | ICD-10-CM | POA: Diagnosis not present

## 2018-07-30 DIAGNOSIS — N2581 Secondary hyperparathyroidism of renal origin: Secondary | ICD-10-CM | POA: Diagnosis not present

## 2018-07-30 DIAGNOSIS — D509 Iron deficiency anemia, unspecified: Secondary | ICD-10-CM | POA: Diagnosis not present

## 2018-07-31 ENCOUNTER — Other Ambulatory Visit: Payer: Self-pay | Admitting: Family Medicine

## 2018-07-31 DIAGNOSIS — M4646 Discitis, unspecified, lumbar region: Secondary | ICD-10-CM

## 2018-08-01 DIAGNOSIS — D509 Iron deficiency anemia, unspecified: Secondary | ICD-10-CM | POA: Diagnosis not present

## 2018-08-01 DIAGNOSIS — N2581 Secondary hyperparathyroidism of renal origin: Secondary | ICD-10-CM | POA: Diagnosis not present

## 2018-08-01 DIAGNOSIS — D631 Anemia in chronic kidney disease: Secondary | ICD-10-CM | POA: Diagnosis not present

## 2018-08-01 DIAGNOSIS — N186 End stage renal disease: Secondary | ICD-10-CM | POA: Diagnosis not present

## 2018-08-04 DIAGNOSIS — N2581 Secondary hyperparathyroidism of renal origin: Secondary | ICD-10-CM | POA: Diagnosis not present

## 2018-08-04 DIAGNOSIS — D509 Iron deficiency anemia, unspecified: Secondary | ICD-10-CM | POA: Diagnosis not present

## 2018-08-04 DIAGNOSIS — D631 Anemia in chronic kidney disease: Secondary | ICD-10-CM | POA: Diagnosis not present

## 2018-08-04 DIAGNOSIS — N186 End stage renal disease: Secondary | ICD-10-CM | POA: Diagnosis not present

## 2018-08-04 NOTE — Telephone Encounter (Signed)
Pt last seen: 07/20/18 Next appt: 11/02/18 Last RX written on: 06/11/18 Date of original fill: 06/11/18 Date of refill(s): n/a  No other controlled substances were filled during this time, please refill if appropriate.

## 2018-08-06 DIAGNOSIS — N2581 Secondary hyperparathyroidism of renal origin: Secondary | ICD-10-CM | POA: Diagnosis not present

## 2018-08-06 DIAGNOSIS — D509 Iron deficiency anemia, unspecified: Secondary | ICD-10-CM | POA: Diagnosis not present

## 2018-08-06 DIAGNOSIS — D631 Anemia in chronic kidney disease: Secondary | ICD-10-CM | POA: Diagnosis not present

## 2018-08-06 DIAGNOSIS — N186 End stage renal disease: Secondary | ICD-10-CM | POA: Diagnosis not present

## 2018-08-07 MED FILL — traMADol HCL 50 MG TABS: 50 | 20 days supply | Qty: 40 | Fill #0

## 2018-08-08 DIAGNOSIS — D509 Iron deficiency anemia, unspecified: Secondary | ICD-10-CM | POA: Diagnosis not present

## 2018-08-08 DIAGNOSIS — N186 End stage renal disease: Secondary | ICD-10-CM | POA: Diagnosis not present

## 2018-08-08 DIAGNOSIS — N2581 Secondary hyperparathyroidism of renal origin: Secondary | ICD-10-CM | POA: Diagnosis not present

## 2018-08-08 DIAGNOSIS — D631 Anemia in chronic kidney disease: Secondary | ICD-10-CM | POA: Diagnosis not present

## 2018-08-10 ENCOUNTER — Other Ambulatory Visit: Payer: Self-pay | Admitting: Family Medicine

## 2018-08-10 DIAGNOSIS — E0841 Diabetes mellitus due to underlying condition with diabetic mononeuropathy: Secondary | ICD-10-CM

## 2018-08-10 MED FILL — ATORVASTATIN CALCIUM 40 MG: 40 | 90 days supply | Qty: 90 | Fill #1

## 2018-08-11 DIAGNOSIS — N186 End stage renal disease: Secondary | ICD-10-CM | POA: Diagnosis not present

## 2018-08-11 DIAGNOSIS — D631 Anemia in chronic kidney disease: Secondary | ICD-10-CM | POA: Diagnosis not present

## 2018-08-11 DIAGNOSIS — N2581 Secondary hyperparathyroidism of renal origin: Secondary | ICD-10-CM | POA: Diagnosis not present

## 2018-08-11 DIAGNOSIS — D509 Iron deficiency anemia, unspecified: Secondary | ICD-10-CM | POA: Diagnosis not present

## 2018-08-12 DIAGNOSIS — R2232 Localized swelling, mass and lump, left upper limb: Secondary | ICD-10-CM | POA: Diagnosis not present

## 2018-08-12 DIAGNOSIS — N186 End stage renal disease: Secondary | ICD-10-CM | POA: Diagnosis not present

## 2018-08-12 DIAGNOSIS — Z992 Dependence on renal dialysis: Secondary | ICD-10-CM | POA: Diagnosis not present

## 2018-08-12 DIAGNOSIS — I871 Compression of vein: Secondary | ICD-10-CM | POA: Diagnosis not present

## 2018-08-13 DIAGNOSIS — D631 Anemia in chronic kidney disease: Secondary | ICD-10-CM | POA: Diagnosis not present

## 2018-08-13 DIAGNOSIS — N2581 Secondary hyperparathyroidism of renal origin: Secondary | ICD-10-CM | POA: Diagnosis not present

## 2018-08-13 DIAGNOSIS — D509 Iron deficiency anemia, unspecified: Secondary | ICD-10-CM | POA: Diagnosis not present

## 2018-08-13 DIAGNOSIS — N186 End stage renal disease: Secondary | ICD-10-CM | POA: Diagnosis not present

## 2018-08-15 DIAGNOSIS — D631 Anemia in chronic kidney disease: Secondary | ICD-10-CM | POA: Diagnosis not present

## 2018-08-15 DIAGNOSIS — N2581 Secondary hyperparathyroidism of renal origin: Secondary | ICD-10-CM | POA: Diagnosis not present

## 2018-08-15 DIAGNOSIS — D509 Iron deficiency anemia, unspecified: Secondary | ICD-10-CM | POA: Diagnosis not present

## 2018-08-15 DIAGNOSIS — N186 End stage renal disease: Secondary | ICD-10-CM | POA: Diagnosis not present

## 2018-08-18 DIAGNOSIS — D631 Anemia in chronic kidney disease: Secondary | ICD-10-CM | POA: Diagnosis not present

## 2018-08-18 DIAGNOSIS — N2581 Secondary hyperparathyroidism of renal origin: Secondary | ICD-10-CM | POA: Diagnosis not present

## 2018-08-18 DIAGNOSIS — N186 End stage renal disease: Secondary | ICD-10-CM | POA: Diagnosis not present

## 2018-08-18 DIAGNOSIS — D509 Iron deficiency anemia, unspecified: Secondary | ICD-10-CM | POA: Diagnosis not present

## 2018-08-19 DIAGNOSIS — N186 End stage renal disease: Secondary | ICD-10-CM | POA: Diagnosis not present

## 2018-08-19 DIAGNOSIS — E1122 Type 2 diabetes mellitus with diabetic chronic kidney disease: Secondary | ICD-10-CM | POA: Diagnosis not present

## 2018-08-19 DIAGNOSIS — Z992 Dependence on renal dialysis: Secondary | ICD-10-CM | POA: Diagnosis not present

## 2018-08-20 DIAGNOSIS — D631 Anemia in chronic kidney disease: Secondary | ICD-10-CM | POA: Diagnosis not present

## 2018-08-20 DIAGNOSIS — D509 Iron deficiency anemia, unspecified: Secondary | ICD-10-CM | POA: Diagnosis not present

## 2018-08-20 DIAGNOSIS — N2581 Secondary hyperparathyroidism of renal origin: Secondary | ICD-10-CM | POA: Diagnosis not present

## 2018-08-20 DIAGNOSIS — N186 End stage renal disease: Secondary | ICD-10-CM | POA: Diagnosis not present

## 2018-08-20 DIAGNOSIS — E1129 Type 2 diabetes mellitus with other diabetic kidney complication: Secondary | ICD-10-CM | POA: Diagnosis not present

## 2018-08-22 DIAGNOSIS — N2581 Secondary hyperparathyroidism of renal origin: Secondary | ICD-10-CM | POA: Diagnosis not present

## 2018-08-22 DIAGNOSIS — N186 End stage renal disease: Secondary | ICD-10-CM | POA: Diagnosis not present

## 2018-08-22 DIAGNOSIS — D509 Iron deficiency anemia, unspecified: Secondary | ICD-10-CM | POA: Diagnosis not present

## 2018-08-22 DIAGNOSIS — E1129 Type 2 diabetes mellitus with other diabetic kidney complication: Secondary | ICD-10-CM | POA: Diagnosis not present

## 2018-08-22 DIAGNOSIS — D631 Anemia in chronic kidney disease: Secondary | ICD-10-CM | POA: Diagnosis not present

## 2018-08-25 DIAGNOSIS — N186 End stage renal disease: Secondary | ICD-10-CM | POA: Diagnosis not present

## 2018-08-25 DIAGNOSIS — E1129 Type 2 diabetes mellitus with other diabetic kidney complication: Secondary | ICD-10-CM | POA: Diagnosis not present

## 2018-08-25 DIAGNOSIS — D631 Anemia in chronic kidney disease: Secondary | ICD-10-CM | POA: Diagnosis not present

## 2018-08-25 DIAGNOSIS — N2581 Secondary hyperparathyroidism of renal origin: Secondary | ICD-10-CM | POA: Diagnosis not present

## 2018-08-25 DIAGNOSIS — D509 Iron deficiency anemia, unspecified: Secondary | ICD-10-CM | POA: Diagnosis not present

## 2018-08-25 MED FILL — GABAPENTIN 300 MG CAPSULE: 300 | 30 days supply | Qty: 30 | Fill #0

## 2018-08-27 DIAGNOSIS — N186 End stage renal disease: Secondary | ICD-10-CM | POA: Diagnosis not present

## 2018-08-27 DIAGNOSIS — D631 Anemia in chronic kidney disease: Secondary | ICD-10-CM | POA: Diagnosis not present

## 2018-08-27 DIAGNOSIS — E1129 Type 2 diabetes mellitus with other diabetic kidney complication: Secondary | ICD-10-CM | POA: Diagnosis not present

## 2018-08-27 DIAGNOSIS — N2581 Secondary hyperparathyroidism of renal origin: Secondary | ICD-10-CM | POA: Diagnosis not present

## 2018-08-27 DIAGNOSIS — D509 Iron deficiency anemia, unspecified: Secondary | ICD-10-CM | POA: Diagnosis not present

## 2018-08-29 DIAGNOSIS — D509 Iron deficiency anemia, unspecified: Secondary | ICD-10-CM | POA: Diagnosis not present

## 2018-08-29 DIAGNOSIS — E1129 Type 2 diabetes mellitus with other diabetic kidney complication: Secondary | ICD-10-CM | POA: Diagnosis not present

## 2018-08-29 DIAGNOSIS — N186 End stage renal disease: Secondary | ICD-10-CM | POA: Diagnosis not present

## 2018-08-29 DIAGNOSIS — D631 Anemia in chronic kidney disease: Secondary | ICD-10-CM | POA: Diagnosis not present

## 2018-08-29 DIAGNOSIS — N2581 Secondary hyperparathyroidism of renal origin: Secondary | ICD-10-CM | POA: Diagnosis not present

## 2018-09-01 DIAGNOSIS — D509 Iron deficiency anemia, unspecified: Secondary | ICD-10-CM | POA: Diagnosis not present

## 2018-09-01 DIAGNOSIS — N186 End stage renal disease: Secondary | ICD-10-CM | POA: Diagnosis not present

## 2018-09-01 DIAGNOSIS — D631 Anemia in chronic kidney disease: Secondary | ICD-10-CM | POA: Diagnosis not present

## 2018-09-01 DIAGNOSIS — E1129 Type 2 diabetes mellitus with other diabetic kidney complication: Secondary | ICD-10-CM | POA: Diagnosis not present

## 2018-09-01 DIAGNOSIS — N2581 Secondary hyperparathyroidism of renal origin: Secondary | ICD-10-CM | POA: Diagnosis not present

## 2018-09-03 DIAGNOSIS — E1129 Type 2 diabetes mellitus with other diabetic kidney complication: Secondary | ICD-10-CM | POA: Diagnosis not present

## 2018-09-03 DIAGNOSIS — D509 Iron deficiency anemia, unspecified: Secondary | ICD-10-CM | POA: Diagnosis not present

## 2018-09-03 DIAGNOSIS — N2581 Secondary hyperparathyroidism of renal origin: Secondary | ICD-10-CM | POA: Diagnosis not present

## 2018-09-03 DIAGNOSIS — D631 Anemia in chronic kidney disease: Secondary | ICD-10-CM | POA: Diagnosis not present

## 2018-09-03 DIAGNOSIS — N186 End stage renal disease: Secondary | ICD-10-CM | POA: Diagnosis not present

## 2018-09-04 ENCOUNTER — Other Ambulatory Visit: Payer: Self-pay | Admitting: Family Medicine

## 2018-09-04 MED FILL — FUROSEMIDE 80 MG TAB: 80 | 30 days supply | Qty: 60 | Fill #0

## 2018-09-05 ENCOUNTER — Other Ambulatory Visit: Payer: Self-pay | Admitting: Family Medicine

## 2018-09-05 DIAGNOSIS — D509 Iron deficiency anemia, unspecified: Secondary | ICD-10-CM | POA: Diagnosis not present

## 2018-09-05 DIAGNOSIS — D631 Anemia in chronic kidney disease: Secondary | ICD-10-CM | POA: Diagnosis not present

## 2018-09-05 DIAGNOSIS — N186 End stage renal disease: Secondary | ICD-10-CM | POA: Diagnosis not present

## 2018-09-05 DIAGNOSIS — E1129 Type 2 diabetes mellitus with other diabetic kidney complication: Secondary | ICD-10-CM | POA: Diagnosis not present

## 2018-09-05 DIAGNOSIS — N2581 Secondary hyperparathyroidism of renal origin: Secondary | ICD-10-CM | POA: Diagnosis not present

## 2018-09-05 MED FILL — CARVEDILOL 6.25 MG TABLET: 6.25 | 15 days supply | Qty: 60 | Fill #2

## 2018-09-07 MED ORDER — AMLODIPINE BESYLATE 10 MG PO TABS: 10.0000 mg | ORAL_TABLET | Freq: Every day | ORAL | 2 refills | Status: DC

## 2018-09-07 MED FILL — AMLODIPINE BESYLATE 10 MG T: 10 | 30 days supply | Qty: 30 | Fill #0

## 2018-09-08 DIAGNOSIS — E1129 Type 2 diabetes mellitus with other diabetic kidney complication: Secondary | ICD-10-CM | POA: Diagnosis not present

## 2018-09-08 DIAGNOSIS — D631 Anemia in chronic kidney disease: Secondary | ICD-10-CM | POA: Diagnosis not present

## 2018-09-08 DIAGNOSIS — D509 Iron deficiency anemia, unspecified: Secondary | ICD-10-CM | POA: Diagnosis not present

## 2018-09-08 DIAGNOSIS — N2581 Secondary hyperparathyroidism of renal origin: Secondary | ICD-10-CM | POA: Diagnosis not present

## 2018-09-08 DIAGNOSIS — N186 End stage renal disease: Secondary | ICD-10-CM | POA: Diagnosis not present

## 2018-09-10 DIAGNOSIS — E1129 Type 2 diabetes mellitus with other diabetic kidney complication: Secondary | ICD-10-CM | POA: Diagnosis not present

## 2018-09-10 DIAGNOSIS — N2581 Secondary hyperparathyroidism of renal origin: Secondary | ICD-10-CM | POA: Diagnosis not present

## 2018-09-10 DIAGNOSIS — D509 Iron deficiency anemia, unspecified: Secondary | ICD-10-CM | POA: Diagnosis not present

## 2018-09-10 DIAGNOSIS — N186 End stage renal disease: Secondary | ICD-10-CM | POA: Diagnosis not present

## 2018-09-10 DIAGNOSIS — D631 Anemia in chronic kidney disease: Secondary | ICD-10-CM | POA: Diagnosis not present

## 2018-09-12 DIAGNOSIS — N2581 Secondary hyperparathyroidism of renal origin: Secondary | ICD-10-CM | POA: Diagnosis not present

## 2018-09-12 DIAGNOSIS — D509 Iron deficiency anemia, unspecified: Secondary | ICD-10-CM | POA: Diagnosis not present

## 2018-09-12 DIAGNOSIS — N186 End stage renal disease: Secondary | ICD-10-CM | POA: Diagnosis not present

## 2018-09-12 DIAGNOSIS — E1129 Type 2 diabetes mellitus with other diabetic kidney complication: Secondary | ICD-10-CM | POA: Diagnosis not present

## 2018-09-12 DIAGNOSIS — D631 Anemia in chronic kidney disease: Secondary | ICD-10-CM | POA: Diagnosis not present

## 2018-09-15 DIAGNOSIS — N186 End stage renal disease: Secondary | ICD-10-CM | POA: Diagnosis not present

## 2018-09-15 DIAGNOSIS — D509 Iron deficiency anemia, unspecified: Secondary | ICD-10-CM | POA: Diagnosis not present

## 2018-09-15 DIAGNOSIS — E1129 Type 2 diabetes mellitus with other diabetic kidney complication: Secondary | ICD-10-CM | POA: Diagnosis not present

## 2018-09-15 DIAGNOSIS — N2581 Secondary hyperparathyroidism of renal origin: Secondary | ICD-10-CM | POA: Diagnosis not present

## 2018-09-15 DIAGNOSIS — D631 Anemia in chronic kidney disease: Secondary | ICD-10-CM | POA: Diagnosis not present

## 2018-09-17 ENCOUNTER — Telehealth: Payer: Self-pay | Admitting: Family Medicine

## 2018-09-17 DIAGNOSIS — E1129 Type 2 diabetes mellitus with other diabetic kidney complication: Secondary | ICD-10-CM | POA: Diagnosis not present

## 2018-09-17 DIAGNOSIS — D631 Anemia in chronic kidney disease: Secondary | ICD-10-CM | POA: Diagnosis not present

## 2018-09-17 DIAGNOSIS — N2581 Secondary hyperparathyroidism of renal origin: Secondary | ICD-10-CM | POA: Diagnosis not present

## 2018-09-17 DIAGNOSIS — D509 Iron deficiency anemia, unspecified: Secondary | ICD-10-CM | POA: Diagnosis not present

## 2018-09-17 DIAGNOSIS — N186 End stage renal disease: Secondary | ICD-10-CM | POA: Diagnosis not present

## 2018-09-17 NOTE — Telephone Encounter (Signed)
1) Medication(s) Requested (by name): traMADol (ULTRAM) 50 MG tablet [[id number]]   2) Pharmacy of Choice: [place] [place] [address]

## 2018-09-17 NOTE — Telephone Encounter (Signed)
Patient was called and patient states that he does not need the medication. He is not having any pain.

## 2018-09-17 NOTE — Telephone Encounter (Signed)
Can you please find out from him the indication for tramadol use?  At his last visit he informed me his back pain was a 2/10 and hence no need for tramadol.  Thank you.

## 2018-09-18 DIAGNOSIS — Z992 Dependence on renal dialysis: Secondary | ICD-10-CM | POA: Diagnosis not present

## 2018-09-18 DIAGNOSIS — N186 End stage renal disease: Secondary | ICD-10-CM | POA: Diagnosis not present

## 2018-09-18 DIAGNOSIS — E1122 Type 2 diabetes mellitus with diabetic chronic kidney disease: Secondary | ICD-10-CM | POA: Diagnosis not present

## 2018-09-19 DIAGNOSIS — N186 End stage renal disease: Secondary | ICD-10-CM | POA: Diagnosis not present

## 2018-09-19 DIAGNOSIS — N2581 Secondary hyperparathyroidism of renal origin: Secondary | ICD-10-CM | POA: Diagnosis not present

## 2018-09-19 DIAGNOSIS — D631 Anemia in chronic kidney disease: Secondary | ICD-10-CM | POA: Diagnosis not present

## 2018-09-22 DIAGNOSIS — N186 End stage renal disease: Secondary | ICD-10-CM | POA: Diagnosis not present

## 2018-09-22 DIAGNOSIS — D631 Anemia in chronic kidney disease: Secondary | ICD-10-CM | POA: Diagnosis not present

## 2018-09-22 DIAGNOSIS — N2581 Secondary hyperparathyroidism of renal origin: Secondary | ICD-10-CM | POA: Diagnosis not present

## 2018-09-24 DIAGNOSIS — N186 End stage renal disease: Secondary | ICD-10-CM | POA: Diagnosis not present

## 2018-09-24 DIAGNOSIS — N2581 Secondary hyperparathyroidism of renal origin: Secondary | ICD-10-CM | POA: Diagnosis not present

## 2018-09-24 DIAGNOSIS — D631 Anemia in chronic kidney disease: Secondary | ICD-10-CM | POA: Diagnosis not present

## 2018-09-25 MED FILL — GABAPENTIN 300 MG CAPSULE: 300 | 30 days supply | Qty: 30 | Fill #1

## 2018-09-26 DIAGNOSIS — N2581 Secondary hyperparathyroidism of renal origin: Secondary | ICD-10-CM | POA: Diagnosis not present

## 2018-09-26 DIAGNOSIS — N186 End stage renal disease: Secondary | ICD-10-CM | POA: Diagnosis not present

## 2018-09-26 DIAGNOSIS — D631 Anemia in chronic kidney disease: Secondary | ICD-10-CM | POA: Diagnosis not present

## 2018-09-28 DIAGNOSIS — I871 Compression of vein: Secondary | ICD-10-CM | POA: Diagnosis not present

## 2018-09-28 DIAGNOSIS — N186 End stage renal disease: Secondary | ICD-10-CM | POA: Diagnosis not present

## 2018-09-28 DIAGNOSIS — T82858A Stenosis of vascular prosthetic devices, implants and grafts, initial encounter: Secondary | ICD-10-CM | POA: Diagnosis not present

## 2018-09-28 DIAGNOSIS — Z992 Dependence on renal dialysis: Secondary | ICD-10-CM | POA: Diagnosis not present

## 2018-09-28 DIAGNOSIS — E113512 Type 2 diabetes mellitus with proliferative diabetic retinopathy with macular edema, left eye: Secondary | ICD-10-CM | POA: Diagnosis not present

## 2018-09-29 DIAGNOSIS — N186 End stage renal disease: Secondary | ICD-10-CM | POA: Diagnosis not present

## 2018-09-29 DIAGNOSIS — N2581 Secondary hyperparathyroidism of renal origin: Secondary | ICD-10-CM | POA: Diagnosis not present

## 2018-09-29 DIAGNOSIS — D631 Anemia in chronic kidney disease: Secondary | ICD-10-CM | POA: Diagnosis not present

## 2018-10-01 DIAGNOSIS — D631 Anemia in chronic kidney disease: Secondary | ICD-10-CM | POA: Diagnosis not present

## 2018-10-01 DIAGNOSIS — N186 End stage renal disease: Secondary | ICD-10-CM | POA: Diagnosis not present

## 2018-10-01 DIAGNOSIS — N2581 Secondary hyperparathyroidism of renal origin: Secondary | ICD-10-CM | POA: Diagnosis not present

## 2018-10-01 MED FILL — AMLODIPINE BESYLATE 10 MG T: 10 | 30 days supply | Qty: 30 | Fill #1

## 2018-10-03 DIAGNOSIS — N2581 Secondary hyperparathyroidism of renal origin: Secondary | ICD-10-CM | POA: Diagnosis not present

## 2018-10-03 DIAGNOSIS — N186 End stage renal disease: Secondary | ICD-10-CM | POA: Diagnosis not present

## 2018-10-03 DIAGNOSIS — D631 Anemia in chronic kidney disease: Secondary | ICD-10-CM | POA: Diagnosis not present

## 2018-10-06 DIAGNOSIS — N186 End stage renal disease: Secondary | ICD-10-CM | POA: Diagnosis not present

## 2018-10-06 DIAGNOSIS — N2581 Secondary hyperparathyroidism of renal origin: Secondary | ICD-10-CM | POA: Diagnosis not present

## 2018-10-06 DIAGNOSIS — D631 Anemia in chronic kidney disease: Secondary | ICD-10-CM | POA: Diagnosis not present

## 2018-10-08 DIAGNOSIS — N186 End stage renal disease: Secondary | ICD-10-CM | POA: Diagnosis not present

## 2018-10-08 DIAGNOSIS — D631 Anemia in chronic kidney disease: Secondary | ICD-10-CM | POA: Diagnosis not present

## 2018-10-08 DIAGNOSIS — N2581 Secondary hyperparathyroidism of renal origin: Secondary | ICD-10-CM | POA: Diagnosis not present

## 2018-10-10 DIAGNOSIS — D631 Anemia in chronic kidney disease: Secondary | ICD-10-CM | POA: Diagnosis not present

## 2018-10-10 DIAGNOSIS — N186 End stage renal disease: Secondary | ICD-10-CM | POA: Diagnosis not present

## 2018-10-10 DIAGNOSIS — N2581 Secondary hyperparathyroidism of renal origin: Secondary | ICD-10-CM | POA: Diagnosis not present

## 2018-10-13 DIAGNOSIS — N2581 Secondary hyperparathyroidism of renal origin: Secondary | ICD-10-CM | POA: Diagnosis not present

## 2018-10-13 DIAGNOSIS — N186 End stage renal disease: Secondary | ICD-10-CM | POA: Diagnosis not present

## 2018-10-13 DIAGNOSIS — D631 Anemia in chronic kidney disease: Secondary | ICD-10-CM | POA: Diagnosis not present

## 2018-10-15 DIAGNOSIS — N186 End stage renal disease: Secondary | ICD-10-CM | POA: Diagnosis not present

## 2018-10-15 DIAGNOSIS — D631 Anemia in chronic kidney disease: Secondary | ICD-10-CM | POA: Diagnosis not present

## 2018-10-15 DIAGNOSIS — N2581 Secondary hyperparathyroidism of renal origin: Secondary | ICD-10-CM | POA: Diagnosis not present

## 2018-10-17 DIAGNOSIS — N2581 Secondary hyperparathyroidism of renal origin: Secondary | ICD-10-CM | POA: Diagnosis not present

## 2018-10-17 DIAGNOSIS — N186 End stage renal disease: Secondary | ICD-10-CM | POA: Diagnosis not present

## 2018-10-17 DIAGNOSIS — D631 Anemia in chronic kidney disease: Secondary | ICD-10-CM | POA: Diagnosis not present

## 2018-10-19 DIAGNOSIS — E1122 Type 2 diabetes mellitus with diabetic chronic kidney disease: Secondary | ICD-10-CM | POA: Diagnosis not present

## 2018-10-19 DIAGNOSIS — Z992 Dependence on renal dialysis: Secondary | ICD-10-CM | POA: Diagnosis not present

## 2018-10-19 DIAGNOSIS — N186 End stage renal disease: Secondary | ICD-10-CM | POA: Diagnosis not present

## 2018-10-20 DIAGNOSIS — N186 End stage renal disease: Secondary | ICD-10-CM | POA: Diagnosis not present

## 2018-10-20 DIAGNOSIS — N2581 Secondary hyperparathyroidism of renal origin: Secondary | ICD-10-CM | POA: Diagnosis not present

## 2018-10-20 DIAGNOSIS — E1129 Type 2 diabetes mellitus with other diabetic kidney complication: Secondary | ICD-10-CM | POA: Diagnosis not present

## 2018-10-20 DIAGNOSIS — D631 Anemia in chronic kidney disease: Secondary | ICD-10-CM | POA: Diagnosis not present

## 2018-10-22 DIAGNOSIS — N2581 Secondary hyperparathyroidism of renal origin: Secondary | ICD-10-CM | POA: Diagnosis not present

## 2018-10-22 DIAGNOSIS — N186 End stage renal disease: Secondary | ICD-10-CM | POA: Diagnosis not present

## 2018-10-22 DIAGNOSIS — D631 Anemia in chronic kidney disease: Secondary | ICD-10-CM | POA: Diagnosis not present

## 2018-10-22 DIAGNOSIS — E1129 Type 2 diabetes mellitus with other diabetic kidney complication: Secondary | ICD-10-CM | POA: Diagnosis not present

## 2018-10-24 DIAGNOSIS — D631 Anemia in chronic kidney disease: Secondary | ICD-10-CM | POA: Diagnosis not present

## 2018-10-24 DIAGNOSIS — N186 End stage renal disease: Secondary | ICD-10-CM | POA: Diagnosis not present

## 2018-10-24 DIAGNOSIS — E1129 Type 2 diabetes mellitus with other diabetic kidney complication: Secondary | ICD-10-CM | POA: Diagnosis not present

## 2018-10-24 DIAGNOSIS — N2581 Secondary hyperparathyroidism of renal origin: Secondary | ICD-10-CM | POA: Diagnosis not present

## 2018-10-28 DIAGNOSIS — N186 End stage renal disease: Secondary | ICD-10-CM | POA: Diagnosis not present

## 2018-10-28 DIAGNOSIS — N2581 Secondary hyperparathyroidism of renal origin: Secondary | ICD-10-CM | POA: Diagnosis not present

## 2018-10-28 DIAGNOSIS — D631 Anemia in chronic kidney disease: Secondary | ICD-10-CM | POA: Diagnosis not present

## 2018-10-28 DIAGNOSIS — E1129 Type 2 diabetes mellitus with other diabetic kidney complication: Secondary | ICD-10-CM | POA: Diagnosis not present

## 2018-10-29 DIAGNOSIS — N2581 Secondary hyperparathyroidism of renal origin: Secondary | ICD-10-CM | POA: Diagnosis not present

## 2018-10-29 DIAGNOSIS — E1129 Type 2 diabetes mellitus with other diabetic kidney complication: Secondary | ICD-10-CM | POA: Diagnosis not present

## 2018-10-29 DIAGNOSIS — N186 End stage renal disease: Secondary | ICD-10-CM | POA: Diagnosis not present

## 2018-10-29 DIAGNOSIS — D631 Anemia in chronic kidney disease: Secondary | ICD-10-CM | POA: Diagnosis not present

## 2018-10-31 DIAGNOSIS — E1129 Type 2 diabetes mellitus with other diabetic kidney complication: Secondary | ICD-10-CM | POA: Diagnosis not present

## 2018-10-31 DIAGNOSIS — N2581 Secondary hyperparathyroidism of renal origin: Secondary | ICD-10-CM | POA: Diagnosis not present

## 2018-10-31 DIAGNOSIS — D631 Anemia in chronic kidney disease: Secondary | ICD-10-CM | POA: Diagnosis not present

## 2018-10-31 DIAGNOSIS — N186 End stage renal disease: Secondary | ICD-10-CM | POA: Diagnosis not present

## 2018-11-02 ENCOUNTER — Encounter: Payer: Self-pay | Admitting: Family Medicine

## 2018-11-02 ENCOUNTER — Other Ambulatory Visit: Payer: Self-pay

## 2018-11-02 ENCOUNTER — Ambulatory Visit: Payer: Medicare Other | Attending: Family Medicine | Admitting: Family Medicine

## 2018-11-02 ENCOUNTER — Other Ambulatory Visit: Payer: Self-pay | Admitting: Internal Medicine

## 2018-11-02 VITALS — BP 159/83 | HR 52 | Temp 97.9°F | Ht 71.0 in | Wt 159.0 lb

## 2018-11-02 DIAGNOSIS — E1122 Type 2 diabetes mellitus with diabetic chronic kidney disease: Secondary | ICD-10-CM | POA: Diagnosis not present

## 2018-11-02 DIAGNOSIS — E1159 Type 2 diabetes mellitus with other circulatory complications: Secondary | ICD-10-CM | POA: Diagnosis not present

## 2018-11-02 DIAGNOSIS — Z992 Dependence on renal dialysis: Secondary | ICD-10-CM | POA: Diagnosis not present

## 2018-11-02 DIAGNOSIS — Z7982 Long term (current) use of aspirin: Secondary | ICD-10-CM | POA: Diagnosis not present

## 2018-11-02 DIAGNOSIS — I120 Hypertensive chronic kidney disease with stage 5 chronic kidney disease or end stage renal disease: Secondary | ICD-10-CM | POA: Insufficient documentation

## 2018-11-02 DIAGNOSIS — N186 End stage renal disease: Secondary | ICD-10-CM | POA: Insufficient documentation

## 2018-11-02 DIAGNOSIS — Z8673 Personal history of transient ischemic attack (TIA), and cerebral infarction without residual deficits: Secondary | ICD-10-CM | POA: Diagnosis not present

## 2018-11-02 DIAGNOSIS — I10 Essential (primary) hypertension: Secondary | ICD-10-CM | POA: Diagnosis not present

## 2018-11-02 DIAGNOSIS — Z79899 Other long term (current) drug therapy: Secondary | ICD-10-CM | POA: Diagnosis not present

## 2018-11-02 DIAGNOSIS — I2510 Atherosclerotic heart disease of native coronary artery without angina pectoris: Secondary | ICD-10-CM | POA: Diagnosis not present

## 2018-11-02 DIAGNOSIS — H5461 Unqualified visual loss, right eye, normal vision left eye: Secondary | ICD-10-CM | POA: Diagnosis not present

## 2018-11-02 DIAGNOSIS — E7800 Pure hypercholesterolemia, unspecified: Secondary | ICD-10-CM | POA: Insufficient documentation

## 2018-11-02 DIAGNOSIS — H409 Unspecified glaucoma: Secondary | ICD-10-CM | POA: Diagnosis not present

## 2018-11-02 DIAGNOSIS — Z1159 Encounter for screening for other viral diseases: Secondary | ICD-10-CM

## 2018-11-02 DIAGNOSIS — E0841 Diabetes mellitus due to underlying condition with diabetic mononeuropathy: Secondary | ICD-10-CM

## 2018-11-02 DIAGNOSIS — E1141 Type 2 diabetes mellitus with diabetic mononeuropathy: Secondary | ICD-10-CM | POA: Insufficient documentation

## 2018-11-02 DIAGNOSIS — E118 Type 2 diabetes mellitus with unspecified complications: Secondary | ICD-10-CM

## 2018-11-02 DIAGNOSIS — N528 Other male erectile dysfunction: Secondary | ICD-10-CM | POA: Insufficient documentation

## 2018-11-02 DIAGNOSIS — Z794 Long term (current) use of insulin: Secondary | ICD-10-CM | POA: Diagnosis not present

## 2018-11-02 LAB — POCT GLYCOSYLATED HEMOGLOBIN (HGB A1C): HbA1c, POC (controlled diabetic range): 7.4 % — AB (ref 0.0–7.0)

## 2018-11-02 LAB — GLUCOSE, POCT (MANUAL RESULT ENTRY): POC Glucose: 175 mg/dl — AB (ref 70–99)

## 2018-11-02 MED ORDER — INSULIN GLARGINE 100 UNIT/ML ~~LOC~~ SOLN: 10.0000 [IU] | Freq: Two times a day (BID) | SUBCUTANEOUS | 6 refills | Status: DC

## 2018-11-02 MED ORDER — CARVEDILOL 6.25 MG PO TABS: 6.2500 mg | ORAL_TABLET | Freq: Two times a day (BID) | ORAL | 1 refills | Status: DC

## 2018-11-02 MED ORDER — ACCU-CHEK AVIVA PLUS VI STRP: ORAL_STRIP | 12 refills | Status: DC

## 2018-11-02 MED ORDER — ATORVASTATIN CALCIUM 40 MG PO TABS: 40.0000 mg | ORAL_TABLET | Freq: Every day | ORAL | 1 refills | Status: DC

## 2018-11-02 MED ORDER — AMLODIPINE BESYLATE 10 MG PO TABS: 10.0000 mg | ORAL_TABLET | Freq: Every day | ORAL | 1 refills | Status: DC

## 2018-11-02 MED ORDER — SILDENAFIL CITRATE 50 MG PO TABS: 50.0000 mg | ORAL_TABLET | Freq: Every day | ORAL | 0 refills | Status: DC | PRN

## 2018-11-02 MED ORDER — FUROSEMIDE 80 MG PO TABS: 80.0000 mg | ORAL_TABLET | Freq: Two times a day (BID) | ORAL | 1 refills | Status: DC

## 2018-11-02 MED ORDER — GABAPENTIN 300 MG PO CAPS: 300.0000 mg | ORAL_CAPSULE | Freq: Every day | ORAL | 1 refills | Status: DC

## 2018-11-02 MED ORDER — INSULIN ASPART 100 UNIT/ML ~~LOC~~ SOLN: SUBCUTANEOUS | 5 refills | Status: DC

## 2018-11-02 MED FILL — LANTUS 100 UNITS/ML VIAL: 100 | 90 days supply | Qty: 30 | Fill #0

## 2018-11-02 MED FILL — NovoLOG 100 UNIT/ML SOLN: 100 | 84 days supply | Qty: 30 | Fill #0

## 2018-11-02 MED FILL — ATORVASTATIN CALCIUM 40 MG: 40 | 30 days supply | Qty: 30 | Fill #0

## 2018-11-02 MED FILL — FUROSEMIDE 80 MG TAB: 80 | 30 days supply | Qty: 60 | Fill #1

## 2018-11-02 MED FILL — AMLODIPINE BESYLATE 10 MG T: 10 | 30 days supply | Qty: 30 | Fill #2

## 2018-11-02 MED FILL — GABAPENTIN 300 MG CAPSULE: 300 | 30 days supply | Qty: 30 | Fill #0

## 2018-11-02 MED FILL — CARVEDILOL 6.25 MG TABLET: 6.25 | 15 days supply | Qty: 60 | Fill #3

## 2018-11-02 NOTE — Progress Notes (Signed)
Subjective:  Patient ID: Russell Price, male    DOB: [birth year]  Age: 58 y.o. MRN: [id number]  CC: Diabetes   HPI [name] is a 58 year old male with a history of hypertension, type 2 diabetes mellitus (A1c 7.4), diabetic neuropathy, blind in the right eye, end-stage renal disease (currently on hemodialysis on Tuesdays, Thursdays and Saturdays) here for follow-up visit. His A1c is 7.4 which is up from 6.5 previously and he endorses compliance with his medications and has not changed his lifestyle recently.  He denies visual concerns today and is up-to-date on his annual eye exam as he saw his ophthalmologist 2 months ago and has an upcoming appointment next month but is unable to recall the name of the practice.  Denies numbness in extremities. His blood pressure is slightly elevated and he endorses compliance with his antihypertensive.  Today he complains of erectile dysfunction which he has had for some time and informs me his previous PCP had given him Viagra which has been ineffective. His hemodialysis sessions are going well. Denies chest pains, dyspnea, pedal edema.  Past Medical History:  Diagnosis Date  . Back pain 03/12/2018  . CAD (coronary artery disease)    a. underwent cath in 02/2016 after an abnormal nuc which showed 2V CAD in RCA and OM3 with no good targets for PCI. Medical managment recommended.   . Chronic kidney disease   . Diabetes mellitus without complication ([place])    type II - followed by Dr Chalmers Cater   . Difficult intubation    ER intubation 2016  . ESRD (end stage renal disease) ([place])    Tues, Th, Sat [place]  . Fever 03/12/2018  . Glaucoma   . Hemodialysis patient [facility])    has been on dialysis since approx 10/2016   . Hypertension    followed by [facility]  . Stroke [facility])    patient denies on 02/19/2017     Past Surgical History:  Procedure Laterality Date  . A/V FISTULAGRAM Left 11/27/2016   Procedure:  A/V Fistulagram;  Surgeon: Waynetta Sandy, MD;  Location: [place] [facility];  Service: Cardiovascular;  Laterality: Left;  . A/V FISTULAGRAM Left 05/07/2017   Procedure: A/V FISTULAGRAM;  Surgeon: Waynetta Sandy, MD;  Location: [place] [facility];  Service: Cardiovascular;  Laterality: Left;  . A/V FISTULAGRAM Left 07/14/2017   Procedure: A/V FISTULAGRAM;  Surgeon: Waynetta Sandy, MD;  Location: [place] [facility];  Service: Cardiovascular;  Laterality: Left;  . AV FISTULA PLACEMENT Left 03/12/2016   Procedure: Left Arm ARTERIOVENOUS (AV) FISTULA CREATION;  Surgeon: Waynetta Sandy, MD;  Location: [facility] OR;  Service: Vascular;  Laterality: Left;  . AV FISTULA PLACEMENT Left 07/18/2017   Procedure: INSERTION OF ARTERIOVENOUS (AV) GORE-TEX GRAFT ARM LEFT UPPER ARM;  Surgeon: Conrad Teutopolis, MD;  Location: [place];  Service: Vascular;  Laterality: Left;  . BASCILIC VEIN TRANSPOSITION Left 05/14/2016   Procedure: SECOND STAGE LEFT BASILIC VEIN TRANSPOSITION;  Surgeon: Waynetta Sandy, MD;  Location: [place];  Service: Vascular;  Laterality: Left;  . BRONCHOSCOPY  02/14/2015   for pulm hemorrhage  . CARDIAC CATHETERIZATION N/A 03/04/2016   Procedure: Left Heart Cath and Coronary Angiography;  Surgeon: Leonie Man, MD;  Location: [place] [facility];  Service: Cardiovascular;  Laterality: N/A;  . COLONOSCOPY WITH PROPOFOL N/A 04/07/2017   Procedure: COLONOSCOPY WITH PROPOFOL;  Surgeon: Ronnette Juniper, MD;  Location: [place];  Service: Gastroenterology;  Laterality:  N/A;  . EYE SURGERY Left   . INSERTION OF DIALYSIS CATHETER Right 11/08/2016   Procedure: INSERTION OF DIALYSIS CATHETER;  Surgeon: Rosetta Posner, MD;  Location: [place];  Service: Vascular;  Laterality: Right;  . IR PARACENTESIS  11/13/2016  . LIGATION OF ARTERIOVENOUS  FISTULA Left 07/18/2017   Procedure: LIGATION OF ARTERIOVENOUS  FISTULA;  Surgeon: Conrad Sneedville, MD;  Location: [place];  Service:  Vascular;  Laterality: Left;  . PERIPHERAL VASCULAR BALLOON ANGIOPLASTY Left 11/27/2016   Procedure: Peripheral Vascular Balloon Angioplasty;  Surgeon: Waynetta Sandy, MD;  Location: [place] [facility];  Service: Cardiovascular;  Laterality: Left;  ARM FISTULA  . PERIPHERAL VASCULAR BALLOON ANGIOPLASTY Left 05/07/2017   Procedure: PERIPHERAL VASCULAR BALLOON ANGIOPLASTY;  Surgeon: Waynetta Sandy, MD;  Location: [place] [facility];  Service: Cardiovascular;  Laterality: Left;    Family History  Problem Relation Age of Onset  . Diabetes Mother     No Known Allergies  Outpatient Medications Prior to Visit  Medication Sig Dispense Refill  . ACCU-CHEK SOFTCLIX LANCETS lancets Use as instructed 3 times daily before meals and at bedtime. E11.9 100 each 12  . Amino Acids-Protein Hydrolys (FEEDING SUPPLEMENT, PRO-STAT SUGAR FREE 64,) LIQD Take 30 mLs by mouth 2 (two) times daily. 900 mL 0  . aspirin 81 MG chewable tablet Chew 1 tablet (81 mg total) by mouth daily. (Patient taking differently: Chew 81 mg by mouth every evening. )    . b complex-vitamin c-folic acid (NEPHRO-VITE) 0.8 MG TABS tablet Take 1 tablet by mouth at bedtime.    . bisacodyl (DULCOLAX) 10 MG suppository Place 1 suppository (10 mg total) rectally daily as needed for moderate constipation. 12 suppository 0  . Blood Glucose Monitoring Suppl (ACCU-CHEK AVIVA) device Use as instructed 3 times daily before meals and at bedtime. E11.9 1 each 0  . glucose blood (ACCU-CHEK AVIVA PLUS) test strip Use 3 times daily before meals and at bedtime. E11.9 100 each 12  . Lancet Devices (ACCU-CHEK SOFTCLIX) lancets Use as instructed 3 times daily before meals and at bedtime. E11.9 1 each 12  . linaclotide (LINZESS) 72 MCG capsule Take 1 capsule (72 mcg total) by mouth daily before breakfast. 30 capsule 1  . RENAGEL 800 MG tablet Take 800-1,600 mg by mouth See admin instructions. Take 1600 mg by mouth 3 times daily with meals  and take 800 mg by mouth twice daily with snacks    . timolol (TIMOPTIC) 0.5 % ophthalmic solution Place 1 drop into the left eye 2 (two) times daily.    Marland Kitchen amLODipine (NORVASC) 10 MG tablet Take 1 tablet (10 mg total) by mouth daily. 30 tablet 2  . atorvastatin (LIPITOR) 40 MG tablet Take 1 tablet (40 mg total) by mouth daily at 6 PM. 30 tablet 5  . carvedilol (COREG) 6.25 MG tablet TAKE 2 TABLETS BY MOUTH 2 TIMES DAILY WITH A MEAL. 60 tablet 3  . furosemide (LASIX) 80 MG tablet TAKE 1 TABLET BY MOUTH 2 TIMES DAILY. 60 tablet 2  . gabapentin (NEURONTIN) 300 MG capsule TAKE 1 CAPSULE BY MOUTH DAILY. 30 capsule 1  . insulin aspart (NOVOLOG) 100 UNIT/ML injection 0-12 units 3 times daily before meals as well as per sliding scale 10 mL 5  . insulin glargine (LANTUS) 100 UNIT/ML injection Inject subcutaneously 10 units of Lantus in the morning and 12 units of Lantus in the evening 30 mL 6  . calcitRIOL (ROCALTROL) 0.25 MCG capsule Take  1 capsule (0.25 mcg total) by mouth daily. (Patient not taking: Reported on 05/04/2018) 30 capsule 6  . cyclobenzaprine (FLEXERIL) 10 MG tablet Take 1 tablet (10 mg total) by mouth 3 (three) times daily as needed for up to 30 doses for muscle spasms. (Patient not taking: Reported on 05/04/2018) 30 tablet 0  . Darbepoetin Alfa (ARANESP) 150 MCG/0.3ML SOSY injection Inject 0.3 mLs (150 mcg total) into the vein every Saturday with hemodialysis. (Patient not taking: Reported on 07/20/2018) 1.68 mL 0  . hydrocortisone (ANUSOL-HC) 2.5 % rectal cream Place rectally 2 (two) times daily. (Patient not taking: Reported on 07/20/2018) 30 g 0  . Misc. Devices MISC Tub transfer bench. Dx: acute discitis and low back pain (Patient not taking: Reported on 07/20/2018) 1 each 0  . Nutritional Supplements (FEEDING SUPPLEMENT, NEPRO CARB STEADY,) LIQD Take 237 mLs by mouth 2 (two) times daily between meals. (Patient not taking: Reported on 07/20/2018) 60 Can 0  . ondansetron (ZOFRAN) 4 MG tablet Take 1  tablet (4 mg total) by mouth every 8 (eight) hours as needed for nausea or vomiting. (Patient not taking: Reported on 05/04/2018) 30 tablet 1  . polyethylene glycol (MIRALAX / GLYCOLAX) packet Take 17 g by mouth daily. (Patient not taking: Reported on 07/20/2018) 30 each 1  . tamsulosin (FLOMAX) 0.4 MG CAPS capsule Take 1 capsule (0.4 mg total) by mouth daily. (Patient not taking: Reported on 05/04/2018) 30 capsule 3  . traMADol (ULTRAM) 50 MG tablet TAKE 1 TABLET (50 MG TOTAL) BY MOUTH EVERY 12 (TWELVE) HOURS AS NEEDED. (Patient not taking: Reported on 11/02/2018) 40 tablet 0   No facility-administered medications prior to visit.      ROS Review of Systems  Constitutional: Negative for activity change and appetite change.  HENT: Negative for sinus pressure and sore throat.   Eyes: Positive for visual disturbance (blind in R eye).  Respiratory: Negative for cough, chest tightness and shortness of breath.   Cardiovascular: Negative for chest pain and leg swelling.  Gastrointestinal: Negative for abdominal distention, abdominal pain, constipation and diarrhea.  Endocrine: Negative.   Genitourinary: Negative for dysuria.  Musculoskeletal: Negative for joint swelling and myalgias.  Skin: Negative for rash.  Allergic/Immunologic: Negative.   Neurological: Negative for weakness, light-headedness and numbness.  Psychiatric/Behavioral: Negative for dysphoric mood and suicidal ideas.    Objective:  BP (!) 159/83   Pulse (!) 52   Temp 97.9 F (36.6 C) (Oral)   Ht 5\' 11"  (1.803 m)   Wt 159 lb (72.1 kg)   SpO2 100%   BMI 22.18 kg/m   BP/Weight 11/02/2018 07/20/2018 27/25/3664  Systolic BP 403 474 259  Diastolic BP 83 58 84  Wt. (Lbs) 159 149.2 150  BMI 22.18 20.81 20.92      Physical Exam Constitutional:      Appearance: He is well-developed.  Cardiovascular:     Rate and Rhythm: Bradycardia present.     Heart sounds: Normal heart sounds. No murmur.     Comments: Unable to palpate  dorsalis pedis and posterior tibialis bilaterally Pulmonary:     Effort: Pulmonary effort is normal.     Breath sounds: Normal breath sounds. No wheezing or rales.  Chest:     Chest wall: No tenderness.  Abdominal:     General: Bowel sounds are normal. There is no distension.     Palpations: Abdomen is soft. There is no mass.     Tenderness: There is no abdominal tenderness.  Musculoskeletal: Normal range of  motion.        General: No swelling or tenderness.     Comments: Left arm AV fistula  Neurological:     Mental Status: He is alert and oriented to person, place, and time.     CMP Latest Ref Rng & Units 04/06/2018 03/21/2018 03/20/2018  Glucose 65 - 99 mg/dL 147(H) 203(H) 136(H)  BUN 7 - 25 mg/dL 40(H) 24(H) 40(H)  Creatinine 0.70 - 1.33 mg/dL 6.11(H) 4.92(H) 7.20(H)  Sodium 135 - 146 mmol/L 134(L) 130(L) 127(L)  Potassium 3.5 - 5.3 mmol/L 4.1 3.8 4.1  Chloride 98 - 110 mmol/L 97(L) 95(L) 90(L)  CO2 20 - 32 mmol/L 26 28 27   Calcium 8.6 - 10.3 mg/dL 10.2 8.7(L) 9.0  Total Protein 6.1 - 8.1 g/dL 8.1 - -  Total Bilirubin 0.2 - 1.2 mg/dL 0.3 - -  Alkaline Phos 38 - 126 U/L - - -  AST 10 - 35 U/L 17 - -  ALT 9 - 46 U/L <3(L) - -    Lipid Panel     Component Value Date/Time   CHOL 92 (L) 10/09/2015 0951   TRIG 49 10/09/2015 0951   HDL 38 (L) 10/09/2015 0951   CHOLHDL 2.4 10/09/2015 0951   VLDL 10 10/09/2015 0951   LDLCALC 44 10/09/2015 0951    CBC    Component Value Date/Time   WBC 6.5 04/06/2018 1644   RBC 3.31 (L) 04/06/2018 1644   HGB 10.1 (L) 04/06/2018 1644   HCT 30.7 (L) 04/06/2018 1644   PLT 224 04/06/2018 1644   MCV 92.7 04/06/2018 1644   MCV 85.0 02/27/2015 1458   MCH 30.5 04/06/2018 1644   MCHC 32.9 04/06/2018 1644   RDW 13.5 04/06/2018 1644   LYMPHSABS 1.8 07/21/2017 2051   MONOABS 0.4 07/21/2017 2051   EOSABS 0.4 07/21/2017 2051   BASOSABS 0.0 07/21/2017 2051    Lab Results  Component Value Date   HGBA1C 7.4 (A) 11/02/2018    Assessment &  Plan:   1. DM (diabetes mellitus) with complications ([place]) Controlled with A1c of 7.4 No regimen change as he is at high risk of hypoglycemia given end-stage renal disease Counseled on Diabetic diet, my plate method, 174 minutes of moderate intensity exercise/week Keep blood sugar logs with fasting goals of 80-120 mg/dl, random of less than 180 and in the event of sugars less than 60 mg/dl or greater than 400 mg/dl please notify the clinic ASAP. It is recommended that you undergo annual eye exams and annual foot exams. Pneumonia vaccine is recommended. - POCT glucose (manual entry) - POCT glycosylated hemoglobin (Hb A1C) - insulin aspart (NOVOLOG) 100 UNIT/ML injection; 0-12 units 3 times daily before meals as well as per sliding scale  Dispense: 10 mL; Refill: 5 - insulin glargine (LANTUS) 100 UNIT/ML injection; Inject 0.1 mLs (10 Units total) into the skin 2 (two) times daily.  Dispense: 30 mL; Refill: 6  2. Essential hypertension Slightly elevated Holding off on adjusting regimen to prevent hypotension during hemodialysis Counseled on blood pressure goal of less than 130/80, low-sodium, DASH diet, medication compliance, 150 minutes of moderate intensity exercise per week. Discussed medication compliance, adverse effects. - carvedilol (COREG) 6.25 MG tablet; Take 1 tablet (6.25 mg total) by mouth 2 (two) times daily with a meal.  Dispense: 180 tablet; Refill: 1 - amLODipine (NORVASC) 10 MG tablet; Take 1 tablet (10 mg total) by mouth daily.  Dispense: 90 tablet; Refill: 1  3. Diabetic mononeuropathy associated with diabetes mellitus due to  underlying condition (HCC) Stable - gabapentin (NEURONTIN) 300 MG capsule; Take 1 capsule (300 mg total) by mouth daily.  Dispense: 90 capsule; Refill: 1  4. Pure hypercholesterolemia Controlled - atorvastatin (LIPITOR) 40 MG tablet; Take 1 tablet (40 mg total) by mouth daily at 6 PM.  Dispense: 90 tablet; Refill: 1  5. Need for hepatitis C  screening test - Hepatitis c antibody (reflex)  6. Other male erectile dysfunction Viagra was ineffective in the past however Cialis contraindicated with hemodialysis He is willing to try Viagra again - sildenafil (VIAGRA) 50 MG tablet; Take 1 tablet (50 mg total) by mouth daily as needed for erectile dysfunction.  Dispense: 10 tablet; Refill: 0  7. ESRD (end stage renal disease) ([place]) Continue as per schedule - furosemide (LASIX) 80 MG tablet; Take 1 tablet (80 mg total) by mouth 2 (two) times daily.  Dispense: 180 tablet; Refill: 1   Meds ordered this encounter  Medications  . sildenafil (VIAGRA) 50 MG tablet    Sig: Take 1 tablet (50 mg total) by mouth daily as needed for erectile dysfunction.    Dispense:  10 tablet    Refill:  0  . carvedilol (COREG) 6.25 MG tablet    Sig: Take 1 tablet (6.25 mg total) by mouth 2 (two) times daily with a meal.    Dispense:  180 tablet    Refill:  1  . furosemide (LASIX) 80 MG tablet    Sig: Take 1 tablet (80 mg total) by mouth 2 (two) times daily.    Dispense:  180 tablet    Refill:  1  . gabapentin (NEURONTIN) 300 MG capsule    Sig: Take 1 capsule (300 mg total) by mouth daily.    Dispense:  90 capsule    Refill:  1  . insulin aspart (NOVOLOG) 100 UNIT/ML injection    Sig: 0-12 units 3 times daily before meals as well as per sliding scale    Dispense:  10 mL    Refill:  5    As per sliding scale  . insulin glargine (LANTUS) 100 UNIT/ML injection    Sig: Inject 0.1 mLs (10 Units total) into the skin 2 (two) times daily.    Dispense:  30 mL    Refill:  6  . amLODipine (NORVASC) 10 MG tablet    Sig: Take 1 tablet (10 mg total) by mouth daily.    Dispense:  90 tablet    Refill:  1  . atorvastatin (LIPITOR) 40 MG tablet    Sig: Take 1 tablet (40 mg total) by mouth daily at 6 PM.    Dispense:  90 tablet    Refill:  1    Follow-up: Return in about 3 months (around 02/02/2019) for Medical conditions.       Charlott Rakes, MD,  FAAFP. [facility] [place], [place]   11/02/2018, 10:01 AM

## 2018-11-02 NOTE — Patient Instructions (Signed)
Erectile Dysfunction  Erectile dysfunction (ED) is the inability to get or keep an erection in order to have sexual intercourse. Erectile dysfunction may include:   Inability to get an erection.   Lack of enough hardness of the erection to allow penetration.   Loss of the erection before sex is finished.  What are the causes?  This condition may be caused by:   Certain medicines, such as:  ? Pain relievers.  ? Antihistamines.  ? Antidepressants.  ? Blood pressure medicines.  ? Water pills (diuretics).  ? Ulcer medicines.  ? Muscle relaxants.  ? Drugs.   Excessive drinking.   Psychological causes, such as:  ? Anxiety.  ? Depression.  ? Sadness.  ? Exhaustion.  ? Performance fear.  ? Stress.   Physical causes, such as:  ? Artery problems. This may include diabetes, smoking, liver disease, or atherosclerosis.  ? High blood pressure.  ? Hormonal problems, such as low testosterone.  ? Obesity.  ? Nerve problems. This may include back or pelvic injuries, diabetes mellitus, multiple sclerosis, or Parkinson disease.  What are the signs or symptoms?  Symptoms of this condition include:   Inability to get an erection.   Lack of enough hardness of the erection to allow penetration.   Loss of the erection before sex is finished.   Normal erections at some times, but with frequent unsatisfactory episodes.   Low sexual satisfaction in either partner due to erection problems.   A curved penis occurring with erection. The curve may cause pain or the penis may be too curved to allow for intercourse.   Never having nighttime erections.  How is this diagnosed?  This condition is often diagnosed by:   Performing a physical exam to find other diseases or specific problems with the penis.   Asking you detailed questions about the problem.   Performing blood tests to check for diabetes mellitus or to measure hormone levels.   Performing other tests to check for underlying health conditions.   Performing an ultrasound  exam to check for scarring.   Performing a test to check blood flow to the penis.   Doing a sleep study at home to measure nighttime erections.  How is this treated?  This condition may be treated by:   Medicine taken by mouth to help you achieve an erection (oral medicine).   Hormone replacement therapy to replace low testosterone levels.   Medicine that is injected into the penis. Your health care provider may instruct you how to give yourself these injections at home.   Vacuum pump. This is a pump with a ring on it. The pump and ring are placed on the penis and used to create pressure that helps the penis become erect.   Penile implant surgery. In this procedure, you may receive:  ? An inflatable implant. This consists of cylinders, a pump, and a reservoir. The cylinders can be inflated with a fluid that helps to create an erection, and they can be deflated after intercourse.  ? A semi-rigid implant. This consists of two silicone rubber rods. The rods provide some rigidity. They are also flexible, so the penis can both curve downward in its normal position and become straight for sexual intercourse.   Blood vessel surgery, to improve blood flow to the penis. During this procedure, a blood vessel from a different part of the body is placed into the penis to allow blood to flow around (bypass) damaged or blocked blood vessels.     Lifestyle changes, such as exercising more, losing weight, and quitting smoking.  Follow these instructions at home:  Medicines     Take over-the-counter and prescription medicines only as told by your health care provider. Do not increase the dosage without first discussing it with your health care provider.   If you are using self-injections, perform injections as directed by your health care provider. Make sure to avoid any veins that are on the surface of the penis. After giving an injection, apply pressure to the injection site for 5 minutes.  General  instructions   Exercise regularly, as directed by your health care provider. Work with your health care provider to lose weight, if needed.   Do not use any products that contain nicotine or tobacco, such as cigarettes and e-cigarettes. If you need help quitting, ask your health care provider.   Before using a vacuum pump, read the instructions that come with the pump and discuss any questions with your health care provider.   Keep all follow-up visits as told by your health care provider. This is important.  Contact a health care provider if:   You feel nauseous.   You vomit.  Get help right away if:   You are taking oral or injectable medicines and you have an erection that lasts longer than 4 hours. If your health care provider is unavailable, go to the nearest emergency room for evaluation. An erection that lasts much longer than 4 hours can result in permanent damage to your penis.   You have severe pain in your groin or abdomen.   You develop redness or severe swelling of your penis.   You have redness spreading up into your groin or lower abdomen.   You are unable to urinate.   You experience chest pain or a rapid heart beat (palpitations) after taking oral medicines.  Summary   Erectile dysfunction (ED) is the inability to get or keep an erection during sexual intercourse. This problem can usually be treated successfully.   This condition is diagnosed based on a physical exam, your symptoms, and tests to determine the cause. Treatment varies depending on the cause, and may include medicines, hormone therapy, surgery, or vacuum pump.   You may need follow-up visits to make sure that you are using your medicines or devices correctly.   Get help right away if you are taking or injecting medicines and you have an erection that lasts longer than 4 hours.  This information is not intended to replace advice given to you by your health care provider. Make sure you discuss any questions you have with  your health care provider.  Document Released: 05/03/2000 Document Revised: 05/22/2016 Document Reviewed: 05/22/2016  Elsevier Interactive Patient Education  2019 [employer]

## 2018-11-03 DIAGNOSIS — N2581 Secondary hyperparathyroidism of renal origin: Secondary | ICD-10-CM | POA: Diagnosis not present

## 2018-11-03 DIAGNOSIS — N186 End stage renal disease: Secondary | ICD-10-CM | POA: Diagnosis not present

## 2018-11-03 DIAGNOSIS — E1129 Type 2 diabetes mellitus with other diabetic kidney complication: Secondary | ICD-10-CM | POA: Diagnosis not present

## 2018-11-03 DIAGNOSIS — D631 Anemia in chronic kidney disease: Secondary | ICD-10-CM | POA: Diagnosis not present

## 2018-11-03 LAB — HEPATITIS C ANTIBODY (REFLEX): HCV Ab: <0.1 s/co ratio (ref 0.0–0.9)

## 2018-11-03 LAB — HCV COMMENT:

## 2018-11-04 ENCOUNTER — Other Ambulatory Visit: Payer: Self-pay

## 2018-11-04 ENCOUNTER — Telehealth: Payer: Self-pay | Admitting: Family Medicine

## 2018-11-04 DIAGNOSIS — T82858A Stenosis of vascular prosthetic devices, implants and grafts, initial encounter: Secondary | ICD-10-CM | POA: Diagnosis not present

## 2018-11-04 DIAGNOSIS — E118 Type 2 diabetes mellitus with unspecified complications: Secondary | ICD-10-CM

## 2018-11-04 DIAGNOSIS — Z992 Dependence on renal dialysis: Secondary | ICD-10-CM | POA: Diagnosis not present

## 2018-11-04 DIAGNOSIS — I871 Compression of vein: Secondary | ICD-10-CM | POA: Diagnosis not present

## 2018-11-04 DIAGNOSIS — N186 End stage renal disease: Secondary | ICD-10-CM | POA: Diagnosis not present

## 2018-11-04 MED ORDER — ACCU-CHEK AVIVA PLUS VI STRP: ORAL_STRIP | 12 refills | Status: DC

## 2018-11-04 NOTE — Telephone Encounter (Signed)
Spoke with

## 2018-11-04 NOTE — Telephone Encounter (Signed)
error 

## 2018-11-05 DIAGNOSIS — N2581 Secondary hyperparathyroidism of renal origin: Secondary | ICD-10-CM | POA: Diagnosis not present

## 2018-11-05 DIAGNOSIS — N186 End stage renal disease: Secondary | ICD-10-CM | POA: Diagnosis not present

## 2018-11-05 DIAGNOSIS — D631 Anemia in chronic kidney disease: Secondary | ICD-10-CM | POA: Diagnosis not present

## 2018-11-05 DIAGNOSIS — E1129 Type 2 diabetes mellitus with other diabetic kidney complication: Secondary | ICD-10-CM | POA: Diagnosis not present

## 2018-11-07 DIAGNOSIS — E1129 Type 2 diabetes mellitus with other diabetic kidney complication: Secondary | ICD-10-CM | POA: Diagnosis not present

## 2018-11-07 DIAGNOSIS — N186 End stage renal disease: Secondary | ICD-10-CM | POA: Diagnosis not present

## 2018-11-07 DIAGNOSIS — N2581 Secondary hyperparathyroidism of renal origin: Secondary | ICD-10-CM | POA: Diagnosis not present

## 2018-11-07 DIAGNOSIS — D631 Anemia in chronic kidney disease: Secondary | ICD-10-CM | POA: Diagnosis not present

## 2018-11-09 DIAGNOSIS — N2581 Secondary hyperparathyroidism of renal origin: Secondary | ICD-10-CM | POA: Diagnosis not present

## 2018-11-09 DIAGNOSIS — N186 End stage renal disease: Secondary | ICD-10-CM | POA: Diagnosis not present

## 2018-11-09 DIAGNOSIS — E8770 Fluid overload, unspecified: Secondary | ICD-10-CM | POA: Diagnosis not present

## 2018-11-10 DIAGNOSIS — E1129 Type 2 diabetes mellitus with other diabetic kidney complication: Secondary | ICD-10-CM | POA: Diagnosis not present

## 2018-11-10 DIAGNOSIS — N2581 Secondary hyperparathyroidism of renal origin: Secondary | ICD-10-CM | POA: Diagnosis not present

## 2018-11-10 DIAGNOSIS — N186 End stage renal disease: Secondary | ICD-10-CM | POA: Diagnosis not present

## 2018-11-10 DIAGNOSIS — D631 Anemia in chronic kidney disease: Secondary | ICD-10-CM | POA: Diagnosis not present

## 2018-11-12 DIAGNOSIS — E1129 Type 2 diabetes mellitus with other diabetic kidney complication: Secondary | ICD-10-CM | POA: Diagnosis not present

## 2018-11-12 DIAGNOSIS — N2581 Secondary hyperparathyroidism of renal origin: Secondary | ICD-10-CM | POA: Diagnosis not present

## 2018-11-12 DIAGNOSIS — N186 End stage renal disease: Secondary | ICD-10-CM | POA: Diagnosis not present

## 2018-11-12 DIAGNOSIS — D631 Anemia in chronic kidney disease: Secondary | ICD-10-CM | POA: Diagnosis not present

## 2018-11-14 DIAGNOSIS — N186 End stage renal disease: Secondary | ICD-10-CM | POA: Diagnosis not present

## 2018-11-14 DIAGNOSIS — D631 Anemia in chronic kidney disease: Secondary | ICD-10-CM | POA: Diagnosis not present

## 2018-11-14 DIAGNOSIS — N2581 Secondary hyperparathyroidism of renal origin: Secondary | ICD-10-CM | POA: Diagnosis not present

## 2018-11-14 DIAGNOSIS — E1129 Type 2 diabetes mellitus with other diabetic kidney complication: Secondary | ICD-10-CM | POA: Diagnosis not present

## 2018-11-17 DIAGNOSIS — N186 End stage renal disease: Secondary | ICD-10-CM | POA: Diagnosis not present

## 2018-11-17 DIAGNOSIS — N2581 Secondary hyperparathyroidism of renal origin: Secondary | ICD-10-CM | POA: Diagnosis not present

## 2018-11-17 DIAGNOSIS — E1129 Type 2 diabetes mellitus with other diabetic kidney complication: Secondary | ICD-10-CM | POA: Diagnosis not present

## 2018-11-17 DIAGNOSIS — D631 Anemia in chronic kidney disease: Secondary | ICD-10-CM | POA: Diagnosis not present

## 2018-11-17 NOTE — Telephone Encounter (Signed)
Patient name and DOB has been verified Patient was informed of lab results. Patient had no questions.  

## 2018-11-17 NOTE — Telephone Encounter (Signed)
-----   Message from Charlott Rakes, MD sent at 11/03/2018  8:41 AM EDT ----- Please inform the patient that labs are normal. Thank you.

## 2018-11-18 DIAGNOSIS — E1122 Type 2 diabetes mellitus with diabetic chronic kidney disease: Secondary | ICD-10-CM | POA: Diagnosis not present

## 2018-11-18 DIAGNOSIS — Z992 Dependence on renal dialysis: Secondary | ICD-10-CM | POA: Diagnosis not present

## 2018-11-18 DIAGNOSIS — N186 End stage renal disease: Secondary | ICD-10-CM | POA: Diagnosis not present

## 2018-11-19 DIAGNOSIS — D631 Anemia in chronic kidney disease: Secondary | ICD-10-CM | POA: Diagnosis not present

## 2018-11-19 DIAGNOSIS — N2581 Secondary hyperparathyroidism of renal origin: Secondary | ICD-10-CM | POA: Diagnosis not present

## 2018-11-19 DIAGNOSIS — N186 End stage renal disease: Secondary | ICD-10-CM | POA: Diagnosis not present

## 2018-11-21 DIAGNOSIS — N186 End stage renal disease: Secondary | ICD-10-CM | POA: Diagnosis not present

## 2018-11-21 DIAGNOSIS — D631 Anemia in chronic kidney disease: Secondary | ICD-10-CM | POA: Diagnosis not present

## 2018-11-21 DIAGNOSIS — N2581 Secondary hyperparathyroidism of renal origin: Secondary | ICD-10-CM | POA: Diagnosis not present

## 2018-11-23 DIAGNOSIS — E113512 Type 2 diabetes mellitus with proliferative diabetic retinopathy with macular edema, left eye: Secondary | ICD-10-CM | POA: Diagnosis not present

## 2018-11-24 DIAGNOSIS — D631 Anemia in chronic kidney disease: Secondary | ICD-10-CM | POA: Diagnosis not present

## 2018-11-24 DIAGNOSIS — N186 End stage renal disease: Secondary | ICD-10-CM | POA: Diagnosis not present

## 2018-11-24 DIAGNOSIS — N2581 Secondary hyperparathyroidism of renal origin: Secondary | ICD-10-CM | POA: Diagnosis not present

## 2018-11-26 DIAGNOSIS — N186 End stage renal disease: Secondary | ICD-10-CM | POA: Diagnosis not present

## 2018-11-26 DIAGNOSIS — N2581 Secondary hyperparathyroidism of renal origin: Secondary | ICD-10-CM | POA: Diagnosis not present

## 2018-11-26 DIAGNOSIS — D631 Anemia in chronic kidney disease: Secondary | ICD-10-CM | POA: Diagnosis not present

## 2018-11-26 DIAGNOSIS — E1129 Type 2 diabetes mellitus with other diabetic kidney complication: Secondary | ICD-10-CM | POA: Diagnosis not present

## 2018-11-28 DIAGNOSIS — N2581 Secondary hyperparathyroidism of renal origin: Secondary | ICD-10-CM | POA: Diagnosis not present

## 2018-11-28 DIAGNOSIS — D631 Anemia in chronic kidney disease: Secondary | ICD-10-CM | POA: Diagnosis not present

## 2018-11-28 DIAGNOSIS — N186 End stage renal disease: Secondary | ICD-10-CM | POA: Diagnosis not present

## 2018-11-30 MED FILL — AMLODIPINE BESYLATE 10 MG T: 10 | 30 days supply | Qty: 30 | Fill #0

## 2018-11-30 MED FILL — GABAPENTIN 300 MG CAPSULE: 300 | 30 days supply | Qty: 30 | Fill #1

## 2018-11-30 MED FILL — CARVEDILOL 6.25 MG TABLET: 6.25 | 30 days supply | Qty: 60 | Fill #0

## 2018-11-30 MED FILL — FUROSEMIDE 80 MG TAB: 80 | 30 days supply | Qty: 60 | Fill #0

## 2018-12-01 DIAGNOSIS — N186 End stage renal disease: Secondary | ICD-10-CM | POA: Diagnosis not present

## 2018-12-01 DIAGNOSIS — N2581 Secondary hyperparathyroidism of renal origin: Secondary | ICD-10-CM | POA: Diagnosis not present

## 2018-12-01 DIAGNOSIS — D631 Anemia in chronic kidney disease: Secondary | ICD-10-CM | POA: Diagnosis not present

## 2018-12-03 DIAGNOSIS — N2581 Secondary hyperparathyroidism of renal origin: Secondary | ICD-10-CM | POA: Diagnosis not present

## 2018-12-03 DIAGNOSIS — D631 Anemia in chronic kidney disease: Secondary | ICD-10-CM | POA: Diagnosis not present

## 2018-12-03 DIAGNOSIS — N186 End stage renal disease: Secondary | ICD-10-CM | POA: Diagnosis not present

## 2018-12-05 DIAGNOSIS — D631 Anemia in chronic kidney disease: Secondary | ICD-10-CM | POA: Diagnosis not present

## 2018-12-05 DIAGNOSIS — N186 End stage renal disease: Secondary | ICD-10-CM | POA: Diagnosis not present

## 2018-12-05 DIAGNOSIS — N2581 Secondary hyperparathyroidism of renal origin: Secondary | ICD-10-CM | POA: Diagnosis not present

## 2018-12-08 DIAGNOSIS — N186 End stage renal disease: Secondary | ICD-10-CM | POA: Diagnosis not present

## 2018-12-08 DIAGNOSIS — D631 Anemia in chronic kidney disease: Secondary | ICD-10-CM | POA: Diagnosis not present

## 2018-12-08 DIAGNOSIS — N2581 Secondary hyperparathyroidism of renal origin: Secondary | ICD-10-CM | POA: Diagnosis not present

## 2018-12-10 DIAGNOSIS — D631 Anemia in chronic kidney disease: Secondary | ICD-10-CM | POA: Diagnosis not present

## 2018-12-10 DIAGNOSIS — N186 End stage renal disease: Secondary | ICD-10-CM | POA: Diagnosis not present

## 2018-12-10 DIAGNOSIS — N2581 Secondary hyperparathyroidism of renal origin: Secondary | ICD-10-CM | POA: Diagnosis not present

## 2018-12-12 DIAGNOSIS — N186 End stage renal disease: Secondary | ICD-10-CM | POA: Diagnosis not present

## 2018-12-12 DIAGNOSIS — D631 Anemia in chronic kidney disease: Secondary | ICD-10-CM | POA: Diagnosis not present

## 2018-12-12 DIAGNOSIS — N2581 Secondary hyperparathyroidism of renal origin: Secondary | ICD-10-CM | POA: Diagnosis not present

## 2018-12-15 DIAGNOSIS — N2581 Secondary hyperparathyroidism of renal origin: Secondary | ICD-10-CM | POA: Diagnosis not present

## 2018-12-15 DIAGNOSIS — D631 Anemia in chronic kidney disease: Secondary | ICD-10-CM | POA: Diagnosis not present

## 2018-12-15 DIAGNOSIS — N186 End stage renal disease: Secondary | ICD-10-CM | POA: Diagnosis not present

## 2018-12-17 DIAGNOSIS — N186 End stage renal disease: Secondary | ICD-10-CM | POA: Diagnosis not present

## 2018-12-17 DIAGNOSIS — D631 Anemia in chronic kidney disease: Secondary | ICD-10-CM | POA: Diagnosis not present

## 2018-12-17 DIAGNOSIS — N2581 Secondary hyperparathyroidism of renal origin: Secondary | ICD-10-CM | POA: Diagnosis not present

## 2018-12-19 DIAGNOSIS — Z992 Dependence on renal dialysis: Secondary | ICD-10-CM | POA: Diagnosis not present

## 2018-12-19 DIAGNOSIS — D631 Anemia in chronic kidney disease: Secondary | ICD-10-CM | POA: Diagnosis not present

## 2018-12-19 DIAGNOSIS — N186 End stage renal disease: Secondary | ICD-10-CM | POA: Diagnosis not present

## 2018-12-19 DIAGNOSIS — D509 Iron deficiency anemia, unspecified: Secondary | ICD-10-CM | POA: Diagnosis not present

## 2018-12-19 DIAGNOSIS — E1122 Type 2 diabetes mellitus with diabetic chronic kidney disease: Secondary | ICD-10-CM | POA: Diagnosis not present

## 2018-12-19 DIAGNOSIS — N2581 Secondary hyperparathyroidism of renal origin: Secondary | ICD-10-CM | POA: Diagnosis not present

## 2018-12-19 DIAGNOSIS — E1129 Type 2 diabetes mellitus with other diabetic kidney complication: Secondary | ICD-10-CM | POA: Diagnosis not present

## 2018-12-22 DIAGNOSIS — D631 Anemia in chronic kidney disease: Secondary | ICD-10-CM | POA: Diagnosis not present

## 2018-12-22 DIAGNOSIS — N2581 Secondary hyperparathyroidism of renal origin: Secondary | ICD-10-CM | POA: Diagnosis not present

## 2018-12-22 DIAGNOSIS — Z992 Dependence on renal dialysis: Secondary | ICD-10-CM | POA: Diagnosis not present

## 2018-12-22 DIAGNOSIS — N186 End stage renal disease: Secondary | ICD-10-CM | POA: Diagnosis not present

## 2018-12-22 DIAGNOSIS — D509 Iron deficiency anemia, unspecified: Secondary | ICD-10-CM | POA: Diagnosis not present

## 2018-12-22 DIAGNOSIS — E1129 Type 2 diabetes mellitus with other diabetic kidney complication: Secondary | ICD-10-CM | POA: Diagnosis not present

## 2018-12-24 DIAGNOSIS — D631 Anemia in chronic kidney disease: Secondary | ICD-10-CM | POA: Diagnosis not present

## 2018-12-24 DIAGNOSIS — N2581 Secondary hyperparathyroidism of renal origin: Secondary | ICD-10-CM | POA: Diagnosis not present

## 2018-12-24 DIAGNOSIS — Z992 Dependence on renal dialysis: Secondary | ICD-10-CM | POA: Diagnosis not present

## 2018-12-24 DIAGNOSIS — N186 End stage renal disease: Secondary | ICD-10-CM | POA: Diagnosis not present

## 2018-12-24 DIAGNOSIS — E1129 Type 2 diabetes mellitus with other diabetic kidney complication: Secondary | ICD-10-CM | POA: Diagnosis not present

## 2018-12-24 DIAGNOSIS — D509 Iron deficiency anemia, unspecified: Secondary | ICD-10-CM | POA: Diagnosis not present

## 2018-12-26 DIAGNOSIS — D509 Iron deficiency anemia, unspecified: Secondary | ICD-10-CM | POA: Diagnosis not present

## 2018-12-26 DIAGNOSIS — Z992 Dependence on renal dialysis: Secondary | ICD-10-CM | POA: Diagnosis not present

## 2018-12-26 DIAGNOSIS — E1129 Type 2 diabetes mellitus with other diabetic kidney complication: Secondary | ICD-10-CM | POA: Diagnosis not present

## 2018-12-26 DIAGNOSIS — D631 Anemia in chronic kidney disease: Secondary | ICD-10-CM | POA: Diagnosis not present

## 2018-12-26 DIAGNOSIS — N2581 Secondary hyperparathyroidism of renal origin: Secondary | ICD-10-CM | POA: Diagnosis not present

## 2018-12-26 DIAGNOSIS — N186 End stage renal disease: Secondary | ICD-10-CM | POA: Diagnosis not present

## 2018-12-28 MED FILL — ATORVASTATIN CALCIUM 40 MG: 40 | 30 days supply | Qty: 30 | Fill #1

## 2018-12-28 MED FILL — AMLODIPINE BESYLATE 10 MG T: 10 | 30 days supply | Qty: 30 | Fill #1

## 2018-12-28 MED FILL — GABAPENTIN 300 MG CAPSULE: 300 | 30 days supply | Qty: 30 | Fill #2

## 2018-12-28 MED FILL — FUROSEMIDE 80 MG TAB: 80 | 30 days supply | Qty: 60 | Fill #1

## 2018-12-29 DIAGNOSIS — N2581 Secondary hyperparathyroidism of renal origin: Secondary | ICD-10-CM | POA: Diagnosis not present

## 2018-12-29 DIAGNOSIS — N186 End stage renal disease: Secondary | ICD-10-CM | POA: Diagnosis not present

## 2018-12-29 DIAGNOSIS — D631 Anemia in chronic kidney disease: Secondary | ICD-10-CM | POA: Diagnosis not present

## 2018-12-29 DIAGNOSIS — D509 Iron deficiency anemia, unspecified: Secondary | ICD-10-CM | POA: Diagnosis not present

## 2018-12-29 DIAGNOSIS — Z992 Dependence on renal dialysis: Secondary | ICD-10-CM | POA: Diagnosis not present

## 2018-12-29 DIAGNOSIS — E1129 Type 2 diabetes mellitus with other diabetic kidney complication: Secondary | ICD-10-CM | POA: Diagnosis not present

## 2018-12-31 DIAGNOSIS — E1129 Type 2 diabetes mellitus with other diabetic kidney complication: Secondary | ICD-10-CM | POA: Diagnosis not present

## 2018-12-31 DIAGNOSIS — N2581 Secondary hyperparathyroidism of renal origin: Secondary | ICD-10-CM | POA: Diagnosis not present

## 2018-12-31 DIAGNOSIS — Z992 Dependence on renal dialysis: Secondary | ICD-10-CM | POA: Diagnosis not present

## 2018-12-31 DIAGNOSIS — D509 Iron deficiency anemia, unspecified: Secondary | ICD-10-CM | POA: Diagnosis not present

## 2018-12-31 DIAGNOSIS — D631 Anemia in chronic kidney disease: Secondary | ICD-10-CM | POA: Diagnosis not present

## 2018-12-31 DIAGNOSIS — N186 End stage renal disease: Secondary | ICD-10-CM | POA: Diagnosis not present

## 2019-01-02 DIAGNOSIS — N2581 Secondary hyperparathyroidism of renal origin: Secondary | ICD-10-CM | POA: Diagnosis not present

## 2019-01-02 DIAGNOSIS — E1129 Type 2 diabetes mellitus with other diabetic kidney complication: Secondary | ICD-10-CM | POA: Diagnosis not present

## 2019-01-02 DIAGNOSIS — Z992 Dependence on renal dialysis: Secondary | ICD-10-CM | POA: Diagnosis not present

## 2019-01-02 DIAGNOSIS — N186 End stage renal disease: Secondary | ICD-10-CM | POA: Diagnosis not present

## 2019-01-02 DIAGNOSIS — D509 Iron deficiency anemia, unspecified: Secondary | ICD-10-CM | POA: Diagnosis not present

## 2019-01-02 DIAGNOSIS — D631 Anemia in chronic kidney disease: Secondary | ICD-10-CM | POA: Diagnosis not present

## 2019-01-05 DIAGNOSIS — D509 Iron deficiency anemia, unspecified: Secondary | ICD-10-CM | POA: Diagnosis not present

## 2019-01-05 DIAGNOSIS — Z992 Dependence on renal dialysis: Secondary | ICD-10-CM | POA: Diagnosis not present

## 2019-01-05 DIAGNOSIS — E1129 Type 2 diabetes mellitus with other diabetic kidney complication: Secondary | ICD-10-CM | POA: Diagnosis not present

## 2019-01-05 DIAGNOSIS — N2581 Secondary hyperparathyroidism of renal origin: Secondary | ICD-10-CM | POA: Diagnosis not present

## 2019-01-05 DIAGNOSIS — D631 Anemia in chronic kidney disease: Secondary | ICD-10-CM | POA: Diagnosis not present

## 2019-01-05 DIAGNOSIS — N186 End stage renal disease: Secondary | ICD-10-CM | POA: Diagnosis not present

## 2019-01-07 DIAGNOSIS — D631 Anemia in chronic kidney disease: Secondary | ICD-10-CM | POA: Diagnosis not present

## 2019-01-07 DIAGNOSIS — E1129 Type 2 diabetes mellitus with other diabetic kidney complication: Secondary | ICD-10-CM | POA: Diagnosis not present

## 2019-01-07 DIAGNOSIS — D509 Iron deficiency anemia, unspecified: Secondary | ICD-10-CM | POA: Diagnosis not present

## 2019-01-07 DIAGNOSIS — N186 End stage renal disease: Secondary | ICD-10-CM | POA: Diagnosis not present

## 2019-01-07 DIAGNOSIS — N2581 Secondary hyperparathyroidism of renal origin: Secondary | ICD-10-CM | POA: Diagnosis not present

## 2019-01-07 DIAGNOSIS — Z992 Dependence on renal dialysis: Secondary | ICD-10-CM | POA: Diagnosis not present

## 2019-01-09 DIAGNOSIS — E1129 Type 2 diabetes mellitus with other diabetic kidney complication: Secondary | ICD-10-CM | POA: Diagnosis not present

## 2019-01-09 DIAGNOSIS — Z992 Dependence on renal dialysis: Secondary | ICD-10-CM | POA: Diagnosis not present

## 2019-01-09 DIAGNOSIS — N186 End stage renal disease: Secondary | ICD-10-CM | POA: Diagnosis not present

## 2019-01-09 DIAGNOSIS — N2581 Secondary hyperparathyroidism of renal origin: Secondary | ICD-10-CM | POA: Diagnosis not present

## 2019-01-09 DIAGNOSIS — D631 Anemia in chronic kidney disease: Secondary | ICD-10-CM | POA: Diagnosis not present

## 2019-01-09 DIAGNOSIS — D509 Iron deficiency anemia, unspecified: Secondary | ICD-10-CM | POA: Diagnosis not present

## 2019-01-12 DIAGNOSIS — N2581 Secondary hyperparathyroidism of renal origin: Secondary | ICD-10-CM | POA: Diagnosis not present

## 2019-01-12 DIAGNOSIS — D509 Iron deficiency anemia, unspecified: Secondary | ICD-10-CM | POA: Diagnosis not present

## 2019-01-12 DIAGNOSIS — E1129 Type 2 diabetes mellitus with other diabetic kidney complication: Secondary | ICD-10-CM | POA: Diagnosis not present

## 2019-01-12 DIAGNOSIS — Z992 Dependence on renal dialysis: Secondary | ICD-10-CM | POA: Diagnosis not present

## 2019-01-12 DIAGNOSIS — N186 End stage renal disease: Secondary | ICD-10-CM | POA: Diagnosis not present

## 2019-01-12 DIAGNOSIS — D631 Anemia in chronic kidney disease: Secondary | ICD-10-CM | POA: Diagnosis not present

## 2019-01-14 DIAGNOSIS — Z992 Dependence on renal dialysis: Secondary | ICD-10-CM | POA: Diagnosis not present

## 2019-01-14 DIAGNOSIS — N186 End stage renal disease: Secondary | ICD-10-CM | POA: Diagnosis not present

## 2019-01-14 DIAGNOSIS — N2581 Secondary hyperparathyroidism of renal origin: Secondary | ICD-10-CM | POA: Diagnosis not present

## 2019-01-14 DIAGNOSIS — E1129 Type 2 diabetes mellitus with other diabetic kidney complication: Secondary | ICD-10-CM | POA: Diagnosis not present

## 2019-01-14 DIAGNOSIS — D631 Anemia in chronic kidney disease: Secondary | ICD-10-CM | POA: Diagnosis not present

## 2019-01-14 DIAGNOSIS — D509 Iron deficiency anemia, unspecified: Secondary | ICD-10-CM | POA: Diagnosis not present

## 2019-01-16 DIAGNOSIS — D509 Iron deficiency anemia, unspecified: Secondary | ICD-10-CM | POA: Diagnosis not present

## 2019-01-16 DIAGNOSIS — Z992 Dependence on renal dialysis: Secondary | ICD-10-CM | POA: Diagnosis not present

## 2019-01-16 DIAGNOSIS — D631 Anemia in chronic kidney disease: Secondary | ICD-10-CM | POA: Diagnosis not present

## 2019-01-16 DIAGNOSIS — N186 End stage renal disease: Secondary | ICD-10-CM | POA: Diagnosis not present

## 2019-01-16 DIAGNOSIS — E1129 Type 2 diabetes mellitus with other diabetic kidney complication: Secondary | ICD-10-CM | POA: Diagnosis not present

## 2019-01-16 DIAGNOSIS — N2581 Secondary hyperparathyroidism of renal origin: Secondary | ICD-10-CM | POA: Diagnosis not present

## 2019-01-19 DIAGNOSIS — E1129 Type 2 diabetes mellitus with other diabetic kidney complication: Secondary | ICD-10-CM | POA: Diagnosis not present

## 2019-01-19 DIAGNOSIS — D509 Iron deficiency anemia, unspecified: Secondary | ICD-10-CM | POA: Diagnosis not present

## 2019-01-19 DIAGNOSIS — Z992 Dependence on renal dialysis: Secondary | ICD-10-CM | POA: Diagnosis not present

## 2019-01-19 DIAGNOSIS — Z23 Encounter for immunization: Secondary | ICD-10-CM | POA: Diagnosis not present

## 2019-01-19 DIAGNOSIS — N186 End stage renal disease: Secondary | ICD-10-CM | POA: Diagnosis not present

## 2019-01-19 DIAGNOSIS — N2581 Secondary hyperparathyroidism of renal origin: Secondary | ICD-10-CM | POA: Diagnosis not present

## 2019-01-19 DIAGNOSIS — E1122 Type 2 diabetes mellitus with diabetic chronic kidney disease: Secondary | ICD-10-CM | POA: Diagnosis not present

## 2019-01-21 DIAGNOSIS — Z23 Encounter for immunization: Secondary | ICD-10-CM | POA: Diagnosis not present

## 2019-01-21 DIAGNOSIS — N186 End stage renal disease: Secondary | ICD-10-CM | POA: Diagnosis not present

## 2019-01-21 DIAGNOSIS — Z992 Dependence on renal dialysis: Secondary | ICD-10-CM | POA: Diagnosis not present

## 2019-01-21 DIAGNOSIS — N2581 Secondary hyperparathyroidism of renal origin: Secondary | ICD-10-CM | POA: Diagnosis not present

## 2019-01-21 DIAGNOSIS — D509 Iron deficiency anemia, unspecified: Secondary | ICD-10-CM | POA: Diagnosis not present

## 2019-01-21 DIAGNOSIS — E1129 Type 2 diabetes mellitus with other diabetic kidney complication: Secondary | ICD-10-CM | POA: Diagnosis not present

## 2019-01-23 DIAGNOSIS — N186 End stage renal disease: Secondary | ICD-10-CM | POA: Diagnosis not present

## 2019-01-23 DIAGNOSIS — Z992 Dependence on renal dialysis: Secondary | ICD-10-CM | POA: Diagnosis not present

## 2019-01-23 DIAGNOSIS — Z23 Encounter for immunization: Secondary | ICD-10-CM | POA: Diagnosis not present

## 2019-01-23 DIAGNOSIS — D509 Iron deficiency anemia, unspecified: Secondary | ICD-10-CM | POA: Diagnosis not present

## 2019-01-23 DIAGNOSIS — N2581 Secondary hyperparathyroidism of renal origin: Secondary | ICD-10-CM | POA: Diagnosis not present

## 2019-01-23 DIAGNOSIS — E1129 Type 2 diabetes mellitus with other diabetic kidney complication: Secondary | ICD-10-CM | POA: Diagnosis not present

## 2019-01-26 DIAGNOSIS — Z992 Dependence on renal dialysis: Secondary | ICD-10-CM | POA: Diagnosis not present

## 2019-01-26 DIAGNOSIS — E1129 Type 2 diabetes mellitus with other diabetic kidney complication: Secondary | ICD-10-CM | POA: Diagnosis not present

## 2019-01-26 DIAGNOSIS — N2581 Secondary hyperparathyroidism of renal origin: Secondary | ICD-10-CM | POA: Diagnosis not present

## 2019-01-26 DIAGNOSIS — D509 Iron deficiency anemia, unspecified: Secondary | ICD-10-CM | POA: Diagnosis not present

## 2019-01-26 DIAGNOSIS — N186 End stage renal disease: Secondary | ICD-10-CM | POA: Diagnosis not present

## 2019-01-26 DIAGNOSIS — Z23 Encounter for immunization: Secondary | ICD-10-CM | POA: Diagnosis not present

## 2019-01-27 DIAGNOSIS — Z992 Dependence on renal dialysis: Secondary | ICD-10-CM | POA: Diagnosis not present

## 2019-01-27 DIAGNOSIS — I871 Compression of vein: Secondary | ICD-10-CM | POA: Diagnosis not present

## 2019-01-27 DIAGNOSIS — N186 End stage renal disease: Secondary | ICD-10-CM | POA: Diagnosis not present

## 2019-01-27 DIAGNOSIS — T82858A Stenosis of vascular prosthetic devices, implants and grafts, initial encounter: Secondary | ICD-10-CM | POA: Diagnosis not present

## 2019-01-27 MED FILL — CARVEDILOL 6.25 MG TABLET: 6.25 | 30 days supply | Qty: 60 | Fill #1

## 2019-01-27 MED FILL — FUROSEMIDE 80 MG TAB: 80 | 30 days supply | Qty: 60 | Fill #2

## 2019-01-27 MED FILL — GABAPENTIN 300 MG CAPSULE: 300 | 30 days supply | Qty: 30 | Fill #3

## 2019-01-28 DIAGNOSIS — N2581 Secondary hyperparathyroidism of renal origin: Secondary | ICD-10-CM | POA: Diagnosis not present

## 2019-01-28 DIAGNOSIS — Z23 Encounter for immunization: Secondary | ICD-10-CM | POA: Diagnosis not present

## 2019-01-28 DIAGNOSIS — D509 Iron deficiency anemia, unspecified: Secondary | ICD-10-CM | POA: Diagnosis not present

## 2019-01-28 DIAGNOSIS — Z992 Dependence on renal dialysis: Secondary | ICD-10-CM | POA: Diagnosis not present

## 2019-01-28 DIAGNOSIS — E1129 Type 2 diabetes mellitus with other diabetic kidney complication: Secondary | ICD-10-CM | POA: Diagnosis not present

## 2019-01-28 DIAGNOSIS — N186 End stage renal disease: Secondary | ICD-10-CM | POA: Diagnosis not present

## 2019-01-30 DIAGNOSIS — N2581 Secondary hyperparathyroidism of renal origin: Secondary | ICD-10-CM | POA: Diagnosis not present

## 2019-01-30 DIAGNOSIS — Z992 Dependence on renal dialysis: Secondary | ICD-10-CM | POA: Diagnosis not present

## 2019-01-30 DIAGNOSIS — D509 Iron deficiency anemia, unspecified: Secondary | ICD-10-CM | POA: Diagnosis not present

## 2019-01-30 DIAGNOSIS — N186 End stage renal disease: Secondary | ICD-10-CM | POA: Diagnosis not present

## 2019-01-30 DIAGNOSIS — Z23 Encounter for immunization: Secondary | ICD-10-CM | POA: Diagnosis not present

## 2019-01-30 DIAGNOSIS — E1129 Type 2 diabetes mellitus with other diabetic kidney complication: Secondary | ICD-10-CM | POA: Diagnosis not present

## 2019-02-01 DIAGNOSIS — E113512 Type 2 diabetes mellitus with proliferative diabetic retinopathy with macular edema, left eye: Secondary | ICD-10-CM | POA: Diagnosis not present

## 2019-02-02 DIAGNOSIS — N2581 Secondary hyperparathyroidism of renal origin: Secondary | ICD-10-CM | POA: Diagnosis not present

## 2019-02-02 DIAGNOSIS — D509 Iron deficiency anemia, unspecified: Secondary | ICD-10-CM | POA: Diagnosis not present

## 2019-02-02 DIAGNOSIS — Z23 Encounter for immunization: Secondary | ICD-10-CM | POA: Diagnosis not present

## 2019-02-02 DIAGNOSIS — Z992 Dependence on renal dialysis: Secondary | ICD-10-CM | POA: Diagnosis not present

## 2019-02-02 DIAGNOSIS — N186 End stage renal disease: Secondary | ICD-10-CM | POA: Diagnosis not present

## 2019-02-02 DIAGNOSIS — E1129 Type 2 diabetes mellitus with other diabetic kidney complication: Secondary | ICD-10-CM | POA: Diagnosis not present

## 2019-02-04 DIAGNOSIS — E1129 Type 2 diabetes mellitus with other diabetic kidney complication: Secondary | ICD-10-CM | POA: Diagnosis not present

## 2019-02-04 DIAGNOSIS — N186 End stage renal disease: Secondary | ICD-10-CM | POA: Diagnosis not present

## 2019-02-04 DIAGNOSIS — Z992 Dependence on renal dialysis: Secondary | ICD-10-CM | POA: Diagnosis not present

## 2019-02-04 DIAGNOSIS — D509 Iron deficiency anemia, unspecified: Secondary | ICD-10-CM | POA: Diagnosis not present

## 2019-02-04 DIAGNOSIS — Z23 Encounter for immunization: Secondary | ICD-10-CM | POA: Diagnosis not present

## 2019-02-04 DIAGNOSIS — N2581 Secondary hyperparathyroidism of renal origin: Secondary | ICD-10-CM | POA: Diagnosis not present

## 2019-02-06 DIAGNOSIS — Z23 Encounter for immunization: Secondary | ICD-10-CM | POA: Diagnosis not present

## 2019-02-06 DIAGNOSIS — E1129 Type 2 diabetes mellitus with other diabetic kidney complication: Secondary | ICD-10-CM | POA: Diagnosis not present

## 2019-02-06 DIAGNOSIS — Z992 Dependence on renal dialysis: Secondary | ICD-10-CM | POA: Diagnosis not present

## 2019-02-06 DIAGNOSIS — N2581 Secondary hyperparathyroidism of renal origin: Secondary | ICD-10-CM | POA: Diagnosis not present

## 2019-02-06 DIAGNOSIS — D509 Iron deficiency anemia, unspecified: Secondary | ICD-10-CM | POA: Diagnosis not present

## 2019-02-06 DIAGNOSIS — N186 End stage renal disease: Secondary | ICD-10-CM | POA: Diagnosis not present

## 2019-02-08 ENCOUNTER — Ambulatory Visit: Payer: Medicare Other | Attending: Family Medicine | Admitting: Family Medicine

## 2019-02-08 ENCOUNTER — Ambulatory Visit (HOSPITAL_BASED_OUTPATIENT_CLINIC_OR_DEPARTMENT_OTHER): Payer: Medicare Other | Admitting: Pharmacist

## 2019-02-08 ENCOUNTER — Other Ambulatory Visit: Payer: Self-pay

## 2019-02-08 ENCOUNTER — Ambulatory Visit: Payer: Medicare Other

## 2019-02-08 DIAGNOSIS — Z8673 Personal history of transient ischemic attack (TIA), and cerebral infarction without residual deficits: Secondary | ICD-10-CM | POA: Insufficient documentation

## 2019-02-08 DIAGNOSIS — Z7982 Long term (current) use of aspirin: Secondary | ICD-10-CM | POA: Diagnosis not present

## 2019-02-08 DIAGNOSIS — Z794 Long term (current) use of insulin: Secondary | ICD-10-CM

## 2019-02-08 DIAGNOSIS — I10 Essential (primary) hypertension: Secondary | ICD-10-CM

## 2019-02-08 DIAGNOSIS — E7800 Pure hypercholesterolemia, unspecified: Secondary | ICD-10-CM

## 2019-02-08 DIAGNOSIS — H5461 Unqualified visual loss, right eye, normal vision left eye: Secondary | ICD-10-CM | POA: Insufficient documentation

## 2019-02-08 DIAGNOSIS — I120 Hypertensive chronic kidney disease with stage 5 chronic kidney disease or end stage renal disease: Secondary | ICD-10-CM | POA: Insufficient documentation

## 2019-02-08 DIAGNOSIS — Z79899 Other long term (current) drug therapy: Secondary | ICD-10-CM | POA: Insufficient documentation

## 2019-02-08 DIAGNOSIS — E1122 Type 2 diabetes mellitus with diabetic chronic kidney disease: Secondary | ICD-10-CM

## 2019-02-08 DIAGNOSIS — Z7901 Long term (current) use of anticoagulants: Secondary | ICD-10-CM | POA: Insufficient documentation

## 2019-02-08 DIAGNOSIS — H409 Unspecified glaucoma: Secondary | ICD-10-CM | POA: Insufficient documentation

## 2019-02-08 DIAGNOSIS — I2510 Atherosclerotic heart disease of native coronary artery without angina pectoris: Secondary | ICD-10-CM | POA: Diagnosis not present

## 2019-02-08 DIAGNOSIS — E1141 Type 2 diabetes mellitus with diabetic mononeuropathy: Secondary | ICD-10-CM | POA: Diagnosis not present

## 2019-02-08 DIAGNOSIS — N186 End stage renal disease: Secondary | ICD-10-CM | POA: Diagnosis not present

## 2019-02-08 DIAGNOSIS — Z23 Encounter for immunization: Secondary | ICD-10-CM

## 2019-02-08 DIAGNOSIS — N528 Other male erectile dysfunction: Secondary | ICD-10-CM | POA: Diagnosis not present

## 2019-02-08 DIAGNOSIS — Z992 Dependence on renal dialysis: Secondary | ICD-10-CM | POA: Diagnosis not present

## 2019-02-08 DIAGNOSIS — E0841 Diabetes mellitus due to underlying condition with diabetic mononeuropathy: Secondary | ICD-10-CM

## 2019-02-08 MED ORDER — ACCU-CHEK AVIVA PLUS VI STRP: ORAL_STRIP | 12 refills | Status: DC

## 2019-02-08 MED ORDER — ATORVASTATIN CALCIUM 40 MG PO TABS: 40.0000 mg | ORAL_TABLET | Freq: Every day | ORAL | 1 refills | Status: DC

## 2019-02-08 MED ORDER — AMLODIPINE BESYLATE 10 MG PO TABS: 10.0000 mg | ORAL_TABLET | Freq: Every day | ORAL | 1 refills | Status: DC

## 2019-02-08 MED ORDER — GABAPENTIN 300 MG PO CAPS: 300.0000 mg | ORAL_CAPSULE | Freq: Every day | ORAL | 1 refills | Status: DC

## 2019-02-08 MED ORDER — FUROSEMIDE 80 MG PO TABS: 80.0000 mg | ORAL_TABLET | Freq: Two times a day (BID) | ORAL | 1 refills | Status: DC

## 2019-02-08 MED ORDER — INSULIN GLARGINE 100 UNIT/ML ~~LOC~~ SOLN: 10.0000 [IU] | Freq: Two times a day (BID) | SUBCUTANEOUS | 6 refills | Status: DC

## 2019-02-08 MED ORDER — CARVEDILOL 6.25 MG PO TABS: 6.2500 mg | ORAL_TABLET | Freq: Two times a day (BID) | ORAL | 1 refills | Status: DC

## 2019-02-08 MED FILL — AMLODIPINE BESYLATE 10 MG T: 10 | 90 days supply | Qty: 90 | Fill #0

## 2019-02-08 MED FILL — LANTUS 100 UNITS/ML VIAL: 100 | 28 days supply | Qty: 10 | Fill #0

## 2019-02-08 MED FILL — ATORVASTATIN CALCIUM 40 MG: 40 | 90 days supply | Qty: 90 | Fill #0

## 2019-02-08 NOTE — Progress Notes (Signed)
Patient presents for vaccination against S. pneumo per orders of Dr. Margarita Rana. Consent given. Counseling provided. No contraindications exists. Vaccine administered without incident.

## 2019-02-08 NOTE — Progress Notes (Signed)
Virtual Visit via Telephone Note  I connected with Russell Price, on 02/08/2019 at 8:41 AM by telephone due to the COVID-19 pandemic and verified that I am speaking with the correct person using two identifiers.   Consent: I discussed the limitations, risks, security and privacy concerns of performing an evaluation and management service by telephone and the availability of in person appointments. I also discussed with the patient that there may be a patient responsible charge related to this service. The patient expressed understanding and agreed to proceed.   Location of Patient: Home  Location of Provider: Clinic   Persons participating in Telemedicine visit: Russell Price Dr. Felecia Shelling     History of Present Illness: Russell Price is a 58 year old male with a history of hypertension, type 2 diabetes mellitus (A1c 7.4), diabetic neuropathy, blind in the right eye, end-stage renal disease (currently on hemodialysis on Tuesdays, Thursdays and Saturdays) here for follow-up visit. With regards to his diabetes mellitus, he denies hypoglycemia and is compliant with his medications; diabetic neuropathy is stable. Tolerating his antihypertensive and statin with no adverse effects. He complains of erectile dysfunction and has been prescribed Viagra however he complains of the cost and was unable to obtain it.  He would like his testosterone level checked. Hemodialysis sessions are going okay. Denies chest pain, dyspnea or additional concerns.   Past Medical History:  Diagnosis Date  . Back pain 03/12/2018  . CAD (coronary artery disease)    a. underwent cath in 02/2016 after an abnormal nuc which showed 2V CAD in RCA and OM3 with no good targets for PCI. Medical managment recommended.   . Chronic kidney disease   . Diabetes mellitus without complication ([place])    type II - followed by Dr Chalmers Cater   . Difficult intubation    ER  intubation 2016  . ESRD (end stage renal disease) ([place])    Tues, Th, Sat [place]  . Fever 03/12/2018  . Glaucoma   . Hemodialysis patient [facility])    has been on dialysis since approx 10/2016   . Hypertension    followed by [facility]  . Stroke [facility])    patient denies on 02/19/2017    No Known Allergies  Current Outpatient Medications on File Prior to Visit  Medication Sig Dispense Refill  . ACCU-CHEK SOFTCLIX LANCETS lancets Use as instructed 3 times daily before meals and at bedtime. E11.9 100 each 12  . Amino Acids-Protein Hydrolys (FEEDING SUPPLEMENT, PRO-STAT SUGAR FREE 64,) LIQD Take 30 mLs by mouth 2 (two) times daily. 900 mL 0  . amLODipine (NORVASC) 10 MG tablet Take 1 tablet (10 mg total) by mouth daily. 90 tablet 1  . aspirin 81 MG chewable tablet Chew 1 tablet (81 mg total) by mouth daily. (Patient taking differently: Chew 81 mg by mouth every evening. )    . atorvastatin (LIPITOR) 40 MG tablet Take 1 tablet (40 mg total) by mouth daily at 6 PM. 90 tablet 1  . b complex-vitamin c-folic acid (NEPHRO-VITE) 0.8 MG TABS tablet Take 1 tablet by mouth at bedtime.    . bisacodyl (DULCOLAX) 10 MG suppository Place 1 suppository (10 mg total) rectally daily as needed for moderate constipation. 12 suppository 0  . Blood Glucose Monitoring Suppl (ACCU-CHEK AVIVA) device Use as instructed 3 times daily before meals and at bedtime. E11.9 1 each 0  . carvedilol (COREG) 6.25 MG tablet Take 1 tablet (6.25 mg total) by mouth 2 (two) times  daily with a meal. 180 tablet 1  . furosemide (LASIX) 80 MG tablet Take 1 tablet (80 mg total) by mouth 2 (two) times daily. 180 tablet 1  . gabapentin (NEURONTIN) 300 MG capsule Take 1 capsule (300 mg total) by mouth daily. 90 capsule 1  . glucose blood (ACCU-CHEK AVIVA PLUS) test strip Use 3 times daily before meals and at bedtime. E11.9 100 each 12  . insulin aspart (NOVOLOG) 100 UNIT/ML injection 0-12 units 3 times daily before meals as  well as per sliding scale 10 mL 5  . insulin glargine (LANTUS) 100 UNIT/ML injection Inject 0.1 mLs (10 Units total) into the skin 2 (two) times daily. 30 mL 6  . Lancet Devices (ACCU-CHEK SOFTCLIX) lancets Use as instructed 3 times daily before meals and at bedtime. E11.9 1 each 12  . linaclotide (LINZESS) 72 MCG capsule Take 1 capsule (72 mcg total) by mouth daily before breakfast. 30 capsule 1  . RENAGEL 800 MG tablet Take 800-1,600 mg by mouth See admin instructions. Take 1600 mg by mouth 3 times daily with meals and take 800 mg by mouth twice daily with snacks    . sildenafil (VIAGRA) 50 MG tablet Take 1 tablet (50 mg total) by mouth daily as needed for erectile dysfunction. 10 tablet 0  . timolol (TIMOPTIC) 0.5 % ophthalmic solution Place 1 drop into the left eye 2 (two) times daily.    . calcitRIOL (ROCALTROL) 0.25 MCG capsule Take 1 capsule (0.25 mcg total) by mouth daily. (Patient not taking: Reported on 05/04/2018) 30 capsule 6  . cyclobenzaprine (FLEXERIL) 10 MG tablet Take 1 tablet (10 mg total) by mouth 3 (three) times daily as needed for up to 30 doses for muscle spasms. (Patient not taking: Reported on 05/04/2018) 30 tablet 0  . Darbepoetin Alfa (ARANESP) 150 MCG/0.3ML SOSY injection Inject 0.3 mLs (150 mcg total) into the vein every Saturday with hemodialysis. (Patient not taking: Reported on 07/20/2018) 1.68 mL 0  . hydrocortisone (ANUSOL-HC) 2.5 % rectal cream Place rectally 2 (two) times daily. (Patient not taking: Reported on 07/20/2018) 30 g 0  . Misc. Devices MISC Tub transfer bench. Dx: acute discitis and low back pain (Patient not taking: Reported on 07/20/2018) 1 each 0  . Nutritional Supplements (FEEDING SUPPLEMENT, NEPRO CARB STEADY,) LIQD Take 237 mLs by mouth 2 (two) times daily between meals. (Patient not taking: Reported on 07/20/2018) 60 Can 0  . ondansetron (ZOFRAN) 4 MG tablet Take 1 tablet (4 mg total) by mouth every 8 (eight) hours as needed for nausea or vomiting. (Patient  not taking: Reported on 05/04/2018) 30 tablet 1  . polyethylene glycol (MIRALAX / GLYCOLAX) packet Take 17 g by mouth daily. (Patient not taking: Reported on 07/20/2018) 30 each 1   No current facility-administered medications on file prior to visit.     Observations/Objective: Awake, alert, oriented x3 Not in acute distress  Lab Results  Component Value Date   HGBA1C 7.4 (A) 11/02/2018    Assessment and Plan: 1. Type 2 diabetes mellitus with chronic kidney disease on chronic dialysis, with long-term current use of insulin (HCC) Controlled with A1c of 7.4 Counseled on Diabetic diet, my plate method, [id number] minutes of moderate intensity exercise/week Keep blood sugar logs with fasting goals of 80-120 mg/dl, random of less than 180 and in the event of sugars less than 60 mg/dl or greater than 400 mg/dl please notify the clinic ASAP. It is recommended that you undergo annual eye exams and annual foot exams.  Pneumonia vaccine is recommended. - insulin glargine (LANTUS) 100 UNIT/ML injection; Inject 0.1 mLs (10 Units total) into the skin 2 (two) times daily.  Dispense: 30 mL; Refill: 6 - glucose blood (ACCU-CHEK AVIVA PLUS) test strip; Use 3 times daily before meals and at bedtime. E11.9  Dispense: 100 each; Refill: 12  2. ESRD (end stage renal disease) (HCC) - furosemide (LASIX) 80 MG tablet; Take 1 tablet (80 mg total) by mouth 2 (two) times daily.  Dispense: 180 tablet; Refill: 1  3. Essential hypertension Stable Counseled on blood pressure goal of less than 130/80, low-sodium, DASH diet, medication compliance, 150 minutes of moderate intensity exercise per week. Discussed medication compliance, adverse effects. - carvedilol (COREG) 6.25 MG tablet; Take 1 tablet (6.25 mg total) by mouth 2 (two) times daily with a meal.  Dispense: 180 tablet; Refill: 1 - amLODipine (NORVASC) 10 MG tablet; Take 1 tablet (10 mg total) by mouth daily.  Dispense: 90 tablet; Refill: 1  4. Pure  hypercholesterolemia Controlled - atorvastatin (LIPITOR) 40 MG tablet; Take 1 tablet (40 mg total) by mouth daily at 6 PM.  Dispense: 90 tablet; Refill: 1  5. Diabetic mononeuropathy associated with diabetes mellitus due to underlying condition (HCC) Stable - Hemoglobin A1c - Lipid panel - gabapentin (NEURONTIN) 300 MG capsule; Take 1 capsule (300 mg total) by mouth daily.  Dispense: 90 capsule; Refill: 1  6. Other male erectile dysfunction Uncontrolled Unable to afford Viagra - Testosterone, Free, Total, SHBG   Follow Up Instructions: 3 months in person   I discussed the assessment and treatment plan with the patient. The patient was provided an opportunity to ask questions and all were answered. The patient agreed with the plan and demonstrated an understanding of the instructions.   The patient was advised to call back or seek an in-person evaluation if the symptoms worsen or if the condition fails to improve as anticipated.     I provided 17 minutes total of non-face-to-face time during this encounter including median intraservice time, reviewing previous notes, labs, imaging, medications, management and patient verbalized understanding.     Charlott Rakes, MD, FAAFP. [facility] [place], [place]   02/08/2019, 8:41 AM

## 2019-02-08 NOTE — Progress Notes (Signed)
Patient has been called and DOB has been verified. Patient has been screened and transferred to PCP to start phone visit.    Patient needs refills on medications. 

## 2019-02-09 DIAGNOSIS — N186 End stage renal disease: Secondary | ICD-10-CM | POA: Diagnosis not present

## 2019-02-09 DIAGNOSIS — Z23 Encounter for immunization: Secondary | ICD-10-CM | POA: Diagnosis not present

## 2019-02-09 DIAGNOSIS — D509 Iron deficiency anemia, unspecified: Secondary | ICD-10-CM | POA: Diagnosis not present

## 2019-02-09 DIAGNOSIS — N2581 Secondary hyperparathyroidism of renal origin: Secondary | ICD-10-CM | POA: Diagnosis not present

## 2019-02-09 DIAGNOSIS — Z992 Dependence on renal dialysis: Secondary | ICD-10-CM | POA: Diagnosis not present

## 2019-02-09 DIAGNOSIS — E1129 Type 2 diabetes mellitus with other diabetic kidney complication: Secondary | ICD-10-CM | POA: Diagnosis not present

## 2019-02-11 ENCOUNTER — Other Ambulatory Visit: Payer: Self-pay | Admitting: Pharmacist

## 2019-02-11 DIAGNOSIS — Z992 Dependence on renal dialysis: Secondary | ICD-10-CM | POA: Diagnosis not present

## 2019-02-11 DIAGNOSIS — E1129 Type 2 diabetes mellitus with other diabetic kidney complication: Secondary | ICD-10-CM | POA: Diagnosis not present

## 2019-02-11 DIAGNOSIS — Z23 Encounter for immunization: Secondary | ICD-10-CM | POA: Diagnosis not present

## 2019-02-11 DIAGNOSIS — E1122 Type 2 diabetes mellitus with diabetic chronic kidney disease: Secondary | ICD-10-CM

## 2019-02-11 DIAGNOSIS — D509 Iron deficiency anemia, unspecified: Secondary | ICD-10-CM | POA: Diagnosis not present

## 2019-02-11 DIAGNOSIS — N186 End stage renal disease: Secondary | ICD-10-CM | POA: Diagnosis not present

## 2019-02-11 DIAGNOSIS — N2581 Secondary hyperparathyroidism of renal origin: Secondary | ICD-10-CM | POA: Diagnosis not present

## 2019-02-11 LAB — TESTOSTERONE, FREE, TOTAL, SHBG
Sex Hormone Binding: 33 nmol/L (ref 19.3–76.4)
Testosterone, Free: 15.1 pg/mL (ref 6.6–18.1)
Testosterone: 259 ng/dL — ABNORMAL LOW (ref 264–916)

## 2019-02-11 LAB — LIPID PANEL
Chol/HDL Ratio: 2.4 ratio (ref 0.0–5.0)
Cholesterol, Total: 89 mg/dL — ABNORMAL LOW (ref 100–199)
HDL: 37 mg/dL — ABNORMAL LOW (ref >39)
LDL Chol Calc (NIH): 31 mg/dL (ref 0–99)
Triglycerides: 113 mg/dL (ref 0–149)
VLDL Cholesterol Cal: 21 mg/dL (ref 5–40)

## 2019-02-11 LAB — HEMOGLOBIN A1C
Est. average glucose Bld gHb Est-mCnc: 157 mg/dL
Hgb A1c MFr Bld: 7.1 % — ABNORMAL HIGH (ref 4.8–5.6)

## 2019-02-11 MED ORDER — ACCU-CHEK AVIVA PLUS VI STRP: ORAL_STRIP | 12 refills | Status: DC

## 2019-02-13 DIAGNOSIS — D509 Iron deficiency anemia, unspecified: Secondary | ICD-10-CM | POA: Diagnosis not present

## 2019-02-13 DIAGNOSIS — Z23 Encounter for immunization: Secondary | ICD-10-CM | POA: Diagnosis not present

## 2019-02-13 DIAGNOSIS — E1129 Type 2 diabetes mellitus with other diabetic kidney complication: Secondary | ICD-10-CM | POA: Diagnosis not present

## 2019-02-13 DIAGNOSIS — N186 End stage renal disease: Secondary | ICD-10-CM | POA: Diagnosis not present

## 2019-02-13 DIAGNOSIS — Z992 Dependence on renal dialysis: Secondary | ICD-10-CM | POA: Diagnosis not present

## 2019-02-13 DIAGNOSIS — N2581 Secondary hyperparathyroidism of renal origin: Secondary | ICD-10-CM | POA: Diagnosis not present

## 2019-02-16 DIAGNOSIS — Z23 Encounter for immunization: Secondary | ICD-10-CM | POA: Diagnosis not present

## 2019-02-16 DIAGNOSIS — N2581 Secondary hyperparathyroidism of renal origin: Secondary | ICD-10-CM | POA: Diagnosis not present

## 2019-02-16 DIAGNOSIS — Z992 Dependence on renal dialysis: Secondary | ICD-10-CM | POA: Diagnosis not present

## 2019-02-16 DIAGNOSIS — N186 End stage renal disease: Secondary | ICD-10-CM | POA: Diagnosis not present

## 2019-02-16 DIAGNOSIS — D509 Iron deficiency anemia, unspecified: Secondary | ICD-10-CM | POA: Diagnosis not present

## 2019-02-16 DIAGNOSIS — E1129 Type 2 diabetes mellitus with other diabetic kidney complication: Secondary | ICD-10-CM | POA: Diagnosis not present

## 2019-02-18 DIAGNOSIS — Z992 Dependence on renal dialysis: Secondary | ICD-10-CM | POA: Diagnosis not present

## 2019-02-18 DIAGNOSIS — E1129 Type 2 diabetes mellitus with other diabetic kidney complication: Secondary | ICD-10-CM | POA: Diagnosis not present

## 2019-02-18 DIAGNOSIS — N186 End stage renal disease: Secondary | ICD-10-CM | POA: Diagnosis not present

## 2019-02-18 DIAGNOSIS — N2581 Secondary hyperparathyroidism of renal origin: Secondary | ICD-10-CM | POA: Diagnosis not present

## 2019-02-18 DIAGNOSIS — D631 Anemia in chronic kidney disease: Secondary | ICD-10-CM | POA: Diagnosis not present

## 2019-02-18 DIAGNOSIS — E1122 Type 2 diabetes mellitus with diabetic chronic kidney disease: Secondary | ICD-10-CM | POA: Diagnosis not present

## 2019-02-20 DIAGNOSIS — D631 Anemia in chronic kidney disease: Secondary | ICD-10-CM | POA: Diagnosis not present

## 2019-02-20 DIAGNOSIS — N2581 Secondary hyperparathyroidism of renal origin: Secondary | ICD-10-CM | POA: Diagnosis not present

## 2019-02-20 DIAGNOSIS — E1129 Type 2 diabetes mellitus with other diabetic kidney complication: Secondary | ICD-10-CM | POA: Diagnosis not present

## 2019-02-20 DIAGNOSIS — Z992 Dependence on renal dialysis: Secondary | ICD-10-CM | POA: Diagnosis not present

## 2019-02-20 DIAGNOSIS — N186 End stage renal disease: Secondary | ICD-10-CM | POA: Diagnosis not present

## 2019-02-23 DIAGNOSIS — N2581 Secondary hyperparathyroidism of renal origin: Secondary | ICD-10-CM | POA: Diagnosis not present

## 2019-02-23 DIAGNOSIS — N186 End stage renal disease: Secondary | ICD-10-CM | POA: Diagnosis not present

## 2019-02-23 DIAGNOSIS — Z992 Dependence on renal dialysis: Secondary | ICD-10-CM | POA: Diagnosis not present

## 2019-02-23 DIAGNOSIS — D631 Anemia in chronic kidney disease: Secondary | ICD-10-CM | POA: Diagnosis not present

## 2019-02-23 DIAGNOSIS — E1129 Type 2 diabetes mellitus with other diabetic kidney complication: Secondary | ICD-10-CM | POA: Diagnosis not present

## 2019-02-25 DIAGNOSIS — E1129 Type 2 diabetes mellitus with other diabetic kidney complication: Secondary | ICD-10-CM | POA: Diagnosis not present

## 2019-02-25 DIAGNOSIS — N2581 Secondary hyperparathyroidism of renal origin: Secondary | ICD-10-CM | POA: Diagnosis not present

## 2019-02-25 DIAGNOSIS — N186 End stage renal disease: Secondary | ICD-10-CM | POA: Diagnosis not present

## 2019-02-25 DIAGNOSIS — D631 Anemia in chronic kidney disease: Secondary | ICD-10-CM | POA: Diagnosis not present

## 2019-02-25 DIAGNOSIS — Z992 Dependence on renal dialysis: Secondary | ICD-10-CM | POA: Diagnosis not present

## 2019-02-27 DIAGNOSIS — N2581 Secondary hyperparathyroidism of renal origin: Secondary | ICD-10-CM | POA: Diagnosis not present

## 2019-02-27 DIAGNOSIS — D631 Anemia in chronic kidney disease: Secondary | ICD-10-CM | POA: Diagnosis not present

## 2019-02-27 DIAGNOSIS — E1129 Type 2 diabetes mellitus with other diabetic kidney complication: Secondary | ICD-10-CM | POA: Diagnosis not present

## 2019-02-27 DIAGNOSIS — N186 End stage renal disease: Secondary | ICD-10-CM | POA: Diagnosis not present

## 2019-02-27 DIAGNOSIS — Z992 Dependence on renal dialysis: Secondary | ICD-10-CM | POA: Diagnosis not present

## 2019-03-01 MED FILL — CARVEDILOL 6.25 MG TABLET: 6.25 | 30 days supply | Qty: 60 | Fill #2

## 2019-03-01 MED FILL — GABAPENTIN 300 MG CAPSULE: 300 | 30 days supply | Qty: 30 | Fill #4

## 2019-03-01 MED FILL — FUROSEMIDE 80 MG TAB: 80 | 30 days supply | Qty: 60 | Fill #3

## 2019-03-02 DIAGNOSIS — N186 End stage renal disease: Secondary | ICD-10-CM | POA: Diagnosis not present

## 2019-03-02 DIAGNOSIS — N2581 Secondary hyperparathyroidism of renal origin: Secondary | ICD-10-CM | POA: Diagnosis not present

## 2019-03-02 DIAGNOSIS — Z992 Dependence on renal dialysis: Secondary | ICD-10-CM | POA: Diagnosis not present

## 2019-03-02 DIAGNOSIS — E1129 Type 2 diabetes mellitus with other diabetic kidney complication: Secondary | ICD-10-CM | POA: Diagnosis not present

## 2019-03-02 DIAGNOSIS — D631 Anemia in chronic kidney disease: Secondary | ICD-10-CM | POA: Diagnosis not present

## 2019-03-04 DIAGNOSIS — Z992 Dependence on renal dialysis: Secondary | ICD-10-CM | POA: Diagnosis not present

## 2019-03-04 DIAGNOSIS — N2581 Secondary hyperparathyroidism of renal origin: Secondary | ICD-10-CM | POA: Diagnosis not present

## 2019-03-04 DIAGNOSIS — D631 Anemia in chronic kidney disease: Secondary | ICD-10-CM | POA: Diagnosis not present

## 2019-03-04 DIAGNOSIS — N186 End stage renal disease: Secondary | ICD-10-CM | POA: Diagnosis not present

## 2019-03-04 DIAGNOSIS — E1129 Type 2 diabetes mellitus with other diabetic kidney complication: Secondary | ICD-10-CM | POA: Diagnosis not present

## 2019-03-06 DIAGNOSIS — N2581 Secondary hyperparathyroidism of renal origin: Secondary | ICD-10-CM | POA: Diagnosis not present

## 2019-03-06 DIAGNOSIS — Z992 Dependence on renal dialysis: Secondary | ICD-10-CM | POA: Diagnosis not present

## 2019-03-06 DIAGNOSIS — E1129 Type 2 diabetes mellitus with other diabetic kidney complication: Secondary | ICD-10-CM | POA: Diagnosis not present

## 2019-03-06 DIAGNOSIS — N186 End stage renal disease: Secondary | ICD-10-CM | POA: Diagnosis not present

## 2019-03-06 DIAGNOSIS — D631 Anemia in chronic kidney disease: Secondary | ICD-10-CM | POA: Diagnosis not present

## 2019-03-09 DIAGNOSIS — Z992 Dependence on renal dialysis: Secondary | ICD-10-CM | POA: Diagnosis not present

## 2019-03-09 DIAGNOSIS — E1129 Type 2 diabetes mellitus with other diabetic kidney complication: Secondary | ICD-10-CM | POA: Diagnosis not present

## 2019-03-09 DIAGNOSIS — D631 Anemia in chronic kidney disease: Secondary | ICD-10-CM | POA: Diagnosis not present

## 2019-03-09 DIAGNOSIS — N2581 Secondary hyperparathyroidism of renal origin: Secondary | ICD-10-CM | POA: Diagnosis not present

## 2019-03-09 DIAGNOSIS — N186 End stage renal disease: Secondary | ICD-10-CM | POA: Diagnosis not present

## 2019-03-11 DIAGNOSIS — N186 End stage renal disease: Secondary | ICD-10-CM | POA: Diagnosis not present

## 2019-03-11 DIAGNOSIS — Z992 Dependence on renal dialysis: Secondary | ICD-10-CM | POA: Diagnosis not present

## 2019-03-11 DIAGNOSIS — N2581 Secondary hyperparathyroidism of renal origin: Secondary | ICD-10-CM | POA: Diagnosis not present

## 2019-03-11 DIAGNOSIS — E1129 Type 2 diabetes mellitus with other diabetic kidney complication: Secondary | ICD-10-CM | POA: Diagnosis not present

## 2019-03-11 DIAGNOSIS — D631 Anemia in chronic kidney disease: Secondary | ICD-10-CM | POA: Diagnosis not present

## 2019-03-13 DIAGNOSIS — N2581 Secondary hyperparathyroidism of renal origin: Secondary | ICD-10-CM | POA: Diagnosis not present

## 2019-03-13 DIAGNOSIS — D631 Anemia in chronic kidney disease: Secondary | ICD-10-CM | POA: Diagnosis not present

## 2019-03-13 DIAGNOSIS — E1129 Type 2 diabetes mellitus with other diabetic kidney complication: Secondary | ICD-10-CM | POA: Diagnosis not present

## 2019-03-13 DIAGNOSIS — N186 End stage renal disease: Secondary | ICD-10-CM | POA: Diagnosis not present

## 2019-03-13 DIAGNOSIS — Z992 Dependence on renal dialysis: Secondary | ICD-10-CM | POA: Diagnosis not present

## 2019-03-16 DIAGNOSIS — D631 Anemia in chronic kidney disease: Secondary | ICD-10-CM | POA: Diagnosis not present

## 2019-03-16 DIAGNOSIS — Z992 Dependence on renal dialysis: Secondary | ICD-10-CM | POA: Diagnosis not present

## 2019-03-16 DIAGNOSIS — N2581 Secondary hyperparathyroidism of renal origin: Secondary | ICD-10-CM | POA: Diagnosis not present

## 2019-03-16 DIAGNOSIS — E1129 Type 2 diabetes mellitus with other diabetic kidney complication: Secondary | ICD-10-CM | POA: Diagnosis not present

## 2019-03-16 DIAGNOSIS — N186 End stage renal disease: Secondary | ICD-10-CM | POA: Diagnosis not present

## 2019-03-18 DIAGNOSIS — E1129 Type 2 diabetes mellitus with other diabetic kidney complication: Secondary | ICD-10-CM | POA: Diagnosis not present

## 2019-03-18 DIAGNOSIS — N186 End stage renal disease: Secondary | ICD-10-CM | POA: Diagnosis not present

## 2019-03-18 DIAGNOSIS — D631 Anemia in chronic kidney disease: Secondary | ICD-10-CM | POA: Diagnosis not present

## 2019-03-18 DIAGNOSIS — Z992 Dependence on renal dialysis: Secondary | ICD-10-CM | POA: Diagnosis not present

## 2019-03-18 DIAGNOSIS — N2581 Secondary hyperparathyroidism of renal origin: Secondary | ICD-10-CM | POA: Diagnosis not present

## 2019-03-20 DIAGNOSIS — N186 End stage renal disease: Secondary | ICD-10-CM | POA: Diagnosis not present

## 2019-03-20 DIAGNOSIS — N2581 Secondary hyperparathyroidism of renal origin: Secondary | ICD-10-CM | POA: Diagnosis not present

## 2019-03-20 DIAGNOSIS — E1129 Type 2 diabetes mellitus with other diabetic kidney complication: Secondary | ICD-10-CM | POA: Diagnosis not present

## 2019-03-20 DIAGNOSIS — D631 Anemia in chronic kidney disease: Secondary | ICD-10-CM | POA: Diagnosis not present

## 2019-03-20 DIAGNOSIS — Z992 Dependence on renal dialysis: Secondary | ICD-10-CM | POA: Diagnosis not present

## 2019-03-21 DIAGNOSIS — E1122 Type 2 diabetes mellitus with diabetic chronic kidney disease: Secondary | ICD-10-CM | POA: Diagnosis not present

## 2019-03-21 DIAGNOSIS — Z992 Dependence on renal dialysis: Secondary | ICD-10-CM | POA: Diagnosis not present

## 2019-03-21 DIAGNOSIS — N186 End stage renal disease: Secondary | ICD-10-CM | POA: Diagnosis not present

## 2019-03-23 DIAGNOSIS — Z992 Dependence on renal dialysis: Secondary | ICD-10-CM | POA: Diagnosis not present

## 2019-03-23 DIAGNOSIS — E1129 Type 2 diabetes mellitus with other diabetic kidney complication: Secondary | ICD-10-CM | POA: Diagnosis not present

## 2019-03-23 DIAGNOSIS — D631 Anemia in chronic kidney disease: Secondary | ICD-10-CM | POA: Diagnosis not present

## 2019-03-23 DIAGNOSIS — N186 End stage renal disease: Secondary | ICD-10-CM | POA: Diagnosis not present

## 2019-03-23 DIAGNOSIS — N2581 Secondary hyperparathyroidism of renal origin: Secondary | ICD-10-CM | POA: Diagnosis not present

## 2019-03-25 DIAGNOSIS — Z992 Dependence on renal dialysis: Secondary | ICD-10-CM | POA: Diagnosis not present

## 2019-03-25 DIAGNOSIS — N186 End stage renal disease: Secondary | ICD-10-CM | POA: Diagnosis not present

## 2019-03-25 DIAGNOSIS — D631 Anemia in chronic kidney disease: Secondary | ICD-10-CM | POA: Diagnosis not present

## 2019-03-25 DIAGNOSIS — E1129 Type 2 diabetes mellitus with other diabetic kidney complication: Secondary | ICD-10-CM | POA: Diagnosis not present

## 2019-03-25 DIAGNOSIS — N2581 Secondary hyperparathyroidism of renal origin: Secondary | ICD-10-CM | POA: Diagnosis not present

## 2019-03-29 DIAGNOSIS — E113512 Type 2 diabetes mellitus with proliferative diabetic retinopathy with macular edema, left eye: Secondary | ICD-10-CM | POA: Diagnosis not present

## 2019-03-30 DIAGNOSIS — Z992 Dependence on renal dialysis: Secondary | ICD-10-CM | POA: Diagnosis not present

## 2019-03-30 DIAGNOSIS — I871 Compression of vein: Secondary | ICD-10-CM | POA: Diagnosis not present

## 2019-03-30 DIAGNOSIS — N186 End stage renal disease: Secondary | ICD-10-CM | POA: Diagnosis not present

## 2019-03-30 MED FILL — GABAPENTIN 300 MG CAPSULE: 300 | 30 days supply | Qty: 30 | Fill #5

## 2019-03-30 MED FILL — FUROSEMIDE 80 MG TAB: 80 | 30 days supply | Qty: 60 | Fill #4

## 2019-03-31 ENCOUNTER — Inpatient Hospital Stay (HOSPITAL_COMMUNITY)
Admission: EM | Admit: 2019-03-31 | Discharge: 2019-04-03 | DRG: 640 | Disposition: A | Discharge status: Home / Self Care | Payer: Medicare Other | Attending: Internal Medicine | Admitting: Internal Medicine

## 2019-03-31 ENCOUNTER — Emergency Department (HOSPITAL_COMMUNITY): Payer: Medicare Other

## 2019-03-31 ENCOUNTER — Other Ambulatory Visit: Payer: Self-pay

## 2019-03-31 ENCOUNTER — Encounter (HOSPITAL_COMMUNITY): Payer: Self-pay | Admitting: *Deleted

## 2019-03-31 DIAGNOSIS — Z7982 Long term (current) use of aspirin: Secondary | ICD-10-CM

## 2019-03-31 DIAGNOSIS — R042 Hemoptysis: Secondary | ICD-10-CM | POA: Diagnosis not present

## 2019-03-31 DIAGNOSIS — R7989 Other specified abnormal findings of blood chemistry: Secondary | ICD-10-CM | POA: Diagnosis not present

## 2019-03-31 DIAGNOSIS — E875 Hyperkalemia: Secondary | ICD-10-CM | POA: Diagnosis present

## 2019-03-31 DIAGNOSIS — J9601 Acute respiratory failure with hypoxia: Secondary | ICD-10-CM | POA: Diagnosis not present

## 2019-03-31 DIAGNOSIS — R509 Fever, unspecified: Secondary | ICD-10-CM | POA: Diagnosis not present

## 2019-03-31 DIAGNOSIS — E8770 Fluid overload, unspecified: Secondary | ICD-10-CM | POA: Diagnosis not present

## 2019-03-31 DIAGNOSIS — R9431 Abnormal electrocardiogram [ECG] [EKG]: Secondary | ICD-10-CM

## 2019-03-31 DIAGNOSIS — Z87891 Personal history of nicotine dependence: Secondary | ICD-10-CM

## 2019-03-31 DIAGNOSIS — R590 Localized enlarged lymph nodes: Secondary | ICD-10-CM | POA: Diagnosis present

## 2019-03-31 DIAGNOSIS — N2581 Secondary hyperparathyroidism of renal origin: Secondary | ICD-10-CM | POA: Diagnosis not present

## 2019-03-31 DIAGNOSIS — E1165 Type 2 diabetes mellitus with hyperglycemia: Secondary | ICD-10-CM | POA: Diagnosis not present

## 2019-03-31 DIAGNOSIS — I2510 Atherosclerotic heart disease of native coronary artery without angina pectoris: Secondary | ICD-10-CM | POA: Diagnosis present

## 2019-03-31 DIAGNOSIS — Z992 Dependence on renal dialysis: Secondary | ICD-10-CM | POA: Diagnosis not present

## 2019-03-31 DIAGNOSIS — Z79899 Other long term (current) drug therapy: Secondary | ICD-10-CM

## 2019-03-31 DIAGNOSIS — E1140 Type 2 diabetes mellitus with diabetic neuropathy, unspecified: Secondary | ICD-10-CM | POA: Diagnosis present

## 2019-03-31 DIAGNOSIS — I120 Hypertensive chronic kidney disease with stage 5 chronic kidney disease or end stage renal disease: Secondary | ICD-10-CM | POA: Diagnosis present

## 2019-03-31 DIAGNOSIS — D631 Anemia in chronic kidney disease: Secondary | ICD-10-CM | POA: Diagnosis not present

## 2019-03-31 DIAGNOSIS — H409 Unspecified glaucoma: Secondary | ICD-10-CM | POA: Diagnosis present

## 2019-03-31 DIAGNOSIS — J810 Acute pulmonary edema: Secondary | ICD-10-CM | POA: Diagnosis not present

## 2019-03-31 DIAGNOSIS — D638 Anemia in other chronic diseases classified elsewhere: Secondary | ICD-10-CM | POA: Diagnosis not present

## 2019-03-31 DIAGNOSIS — N186 End stage renal disease: Secondary | ICD-10-CM | POA: Diagnosis present

## 2019-03-31 DIAGNOSIS — E119 Type 2 diabetes mellitus without complications: Secondary | ICD-10-CM | POA: Diagnosis not present

## 2019-03-31 DIAGNOSIS — Z794 Long term (current) use of insulin: Secondary | ICD-10-CM

## 2019-03-31 DIAGNOSIS — I10 Essential (primary) hypertension: Secondary | ICD-10-CM | POA: Diagnosis not present

## 2019-03-31 DIAGNOSIS — R05 Cough: Secondary | ICD-10-CM | POA: Diagnosis not present

## 2019-03-31 DIAGNOSIS — R0602 Shortness of breath: Secondary | ICD-10-CM | POA: Diagnosis not present

## 2019-03-31 DIAGNOSIS — E1122 Type 2 diabetes mellitus with diabetic chronic kidney disease: Secondary | ICD-10-CM | POA: Diagnosis present

## 2019-03-31 DIAGNOSIS — Z20828 Contact with and (suspected) exposure to other viral communicable diseases: Secondary | ICD-10-CM | POA: Diagnosis not present

## 2019-03-31 DIAGNOSIS — E785 Hyperlipidemia, unspecified: Secondary | ICD-10-CM | POA: Diagnosis present

## 2019-03-31 DIAGNOSIS — Z833 Family history of diabetes mellitus: Secondary | ICD-10-CM

## 2019-03-31 DIAGNOSIS — H5461 Unqualified visual loss, right eye, normal vision left eye: Secondary | ICD-10-CM | POA: Diagnosis present

## 2019-03-31 DIAGNOSIS — I248 Other forms of acute ischemic heart disease: Secondary | ICD-10-CM | POA: Diagnosis present

## 2019-03-31 DIAGNOSIS — R0902 Hypoxemia: Secondary | ICD-10-CM | POA: Diagnosis not present

## 2019-03-31 DIAGNOSIS — D696 Thrombocytopenia, unspecified: Secondary | ICD-10-CM | POA: Diagnosis present

## 2019-03-31 LAB — BASIC METABOLIC PANEL
Anion gap: 18 — ABNORMAL HIGH (ref 5–15)
BUN: 122 mg/dL — ABNORMAL HIGH (ref 6–20)
CO2: 18 mmol/L — ABNORMAL LOW (ref 22–32)
Calcium: 9.2 mg/dL (ref 8.9–10.3)
Chloride: 99 mmol/L (ref 98–111)
Creatinine, Ser: 12.46 mg/dL — ABNORMAL HIGH (ref 0.61–1.24)
GFR calc Af Amer: 4 mL/min — ABNORMAL LOW (ref >60)
GFR calc non Af Amer: 4 mL/min — ABNORMAL LOW (ref >60)
Glucose, Bld: 254 mg/dL — ABNORMAL HIGH (ref 70–99)
Potassium: 5 mmol/L (ref 3.5–5.1)
Sodium: 135 mmol/L (ref 135–145)

## 2019-03-31 LAB — CBC
HCT: 31.8 % — ABNORMAL LOW (ref 39.0–52.0)
Hemoglobin: 10.7 g/dL — ABNORMAL LOW (ref 13.0–17.0)
MCH: 32.5 pg (ref 26.0–34.0)
MCHC: 33.6 g/dL (ref 30.0–36.0)
MCV: 96.7 fL (ref 80.0–100.0)
Platelets: 103 10*3/uL — ABNORMAL LOW (ref 150–400)
RBC: 3.29 MIL/uL — ABNORMAL LOW (ref 4.22–5.81)
RDW: 15.9 % — ABNORMAL HIGH (ref 11.5–15.5)
WBC: 9.5 10*3/uL (ref 4.0–10.5)
nRBC: 0 % (ref 0.0–0.2)

## 2019-03-31 LAB — TROPONIN I (HIGH SENSITIVITY)
Troponin I (High Sensitivity): 68 ng/L — ABNORMAL HIGH (ref <18)
Troponin I (High Sensitivity): 106 ng/L (ref <18)

## 2019-03-31 LAB — CBG MONITORING, ED: Glucose-Capillary: 232 mg/dL — ABNORMAL HIGH (ref 70–99)

## 2019-03-31 LAB — PROTIME-INR
INR: 1.2 (ref 0.8–1.2)
Prothrombin Time: 14.9 seconds (ref 11.4–15.2)

## 2019-03-31 LAB — POTASSIUM: Potassium: 5.8 mmol/L — ABNORMAL HIGH (ref 3.5–5.1)

## 2019-03-31 MED ORDER — SEVELAMER CARBONATE 800 MG PO TABS
1600.0000 mg | ORAL_TABLET | Freq: Three times a day (TID) | ORAL | Status: DC
  Administered 2019-04-01 – 2019-04-02 (×5): 1600 mg via ORAL
  Filled 2019-03-31 (×6): qty 2

## 2019-03-31 MED ORDER — INSULIN GLARGINE 100 UNIT/ML ~~LOC~~ SOLN
10.0000 [IU] | Freq: Two times a day (BID) | SUBCUTANEOUS | Status: DC
  Administered 2019-04-01 – 2019-04-03 (×5): 10 [IU] via SUBCUTANEOUS
  Filled 2019-03-31 (×6): qty 0.1

## 2019-03-31 MED ORDER — CHLORHEXIDINE GLUCONATE CLOTH 2 % EX PADS: 6.0000 | MEDICATED_PAD | Freq: Every day | CUTANEOUS | Status: DC

## 2019-03-31 MED ORDER — ONDANSETRON HCL 4 MG PO TABS: 4.0000 mg | ORAL_TABLET | Freq: Four times a day (QID) | ORAL | Status: DC | PRN

## 2019-03-31 MED ORDER — TIMOLOL MALEATE 0.5 % OP SOLN
1.0000 [drp] | Freq: Two times a day (BID) | OPHTHALMIC | Status: DC
  Administered 2019-04-01 – 2019-04-03 (×5): 1 [drp] via OPHTHALMIC
  Filled 2019-03-31: qty 5

## 2019-03-31 MED ORDER — CARVEDILOL 6.25 MG PO TABS
6.2500 mg | ORAL_TABLET | Freq: Two times a day (BID) | ORAL | Status: DC
  Administered 2019-04-01: 6.25 mg via ORAL
  Filled 2019-03-31 (×3): qty 1

## 2019-03-31 MED ORDER — IOHEXOL 350 MG/ML SOLN
75.0000 mL | Freq: Once | INTRAVENOUS | Status: AC | PRN
  Administered 2019-03-31: 75 mL via INTRAVENOUS

## 2019-03-31 MED ORDER — INSULIN ASPART 100 UNIT/ML ~~LOC~~ SOLN
0.0000 [IU] | Freq: Three times a day (TID) | SUBCUTANEOUS | Status: DC
  Administered 2019-04-01: 5 [IU] via SUBCUTANEOUS
  Administered 2019-04-01 (×2): 1 [IU] via SUBCUTANEOUS
  Administered 2019-04-02: 3 [IU] via SUBCUTANEOUS
  Administered 2019-04-02: 5 [IU] via SUBCUTANEOUS
  Administered 2019-04-03: 2 [IU] via SUBCUTANEOUS

## 2019-03-31 MED ORDER — ACETAMINOPHEN 650 MG RE SUPP: 650.0000 mg | Freq: Four times a day (QID) | RECTAL | Status: DC | PRN

## 2019-03-31 MED ORDER — ATORVASTATIN CALCIUM 40 MG PO TABS
40.0000 mg | ORAL_TABLET | Freq: Every day | ORAL | Status: DC
  Administered 2019-04-01 – 2019-04-02 (×2): 40 mg via ORAL
  Filled 2019-03-31 (×2): qty 1

## 2019-03-31 MED ORDER — AMLODIPINE BESYLATE 10 MG PO TABS
10.0000 mg | ORAL_TABLET | Freq: Every day | ORAL | Status: DC
  Administered 2019-04-01: 10 mg via ORAL
  Filled 2019-03-31 (×2): qty 1

## 2019-03-31 MED ORDER — BISACODYL 10 MG RE SUPP: 10.0000 mg | Freq: Every day | RECTAL | Status: DC | PRN

## 2019-03-31 MED ORDER — ACETAMINOPHEN 325 MG PO TABS
650.0000 mg | ORAL_TABLET | Freq: Four times a day (QID) | ORAL | Status: DC | PRN
  Administered 2019-04-01: 650 mg via ORAL
  Filled 2019-03-31: qty 2

## 2019-03-31 MED ORDER — ONDANSETRON HCL 4 MG/2ML IJ SOLN
4.0000 mg | Freq: Four times a day (QID) | INTRAMUSCULAR | Status: DC | PRN
  Administered 2019-03-31: 4 mg via INTRAVENOUS
  Filled 2019-03-31: qty 2

## 2019-03-31 MED ORDER — CALCITRIOL 0.25 MCG PO CAPS: 0.2500 ug | ORAL_CAPSULE | Freq: Every day | ORAL | Status: DC

## 2019-03-31 MED ORDER — FUROSEMIDE 80 MG PO TABS
80.0000 mg | ORAL_TABLET | Freq: Two times a day (BID) | ORAL | Status: DC
  Administered 2019-04-01 (×2): 80 mg via ORAL
  Filled 2019-03-31 (×3): qty 1

## 2019-03-31 MED ORDER — GABAPENTIN 300 MG PO CAPS
300.0000 mg | ORAL_CAPSULE | Freq: Every day | ORAL | Status: DC
  Administered 2019-04-01 – 2019-04-03 (×3): 300 mg via ORAL
  Filled 2019-03-31 (×3): qty 1

## 2019-03-31 MED ORDER — LINACLOTIDE 72 MCG PO CAPS: 72.0000 ug | ORAL_CAPSULE | Freq: Every day | ORAL | Status: DC

## 2019-03-31 NOTE — ED Triage Notes (Signed)
Pt arrived by [facility] from home. Dialysis pt, last treatment was Saturday. Had clot to his graft left arm, had surgery to repair it on Monday. Reports productive cough that started today with blood tinged sputum. cbg [id number].

## 2019-03-31 NOTE — H&P (Signed)
History and Physical    Kellen Sheikhidris Osburn TY:[id number] DOB: 22-Sep-1960 DOA: 03/31/2019  PCP: Charlott Rakes, MD  Patient coming from: Home.  Chief Complaint: Hemoptysis.  HPI: Russell Price is a 58 y.o. male with history of ESRD on hemodialysis on Tuesday Thursday Saturday had missed his dialysis last 2 session due to clotted fistula which was declotted 2 days ago.  Patient started becoming short of breath and has hemoptysis since this morning.  Denies any fever chills sick contacts or any history of tuberculosis.  Given the symptoms patient presented to the ER.  ED Course: In the ER patient was afebrile EKG was showing normal sinus rhythm with peaked T waves and ST depression in the inferior and lateral leads.  CT angiogram of the chest was showing pulmonary edema.  Labs show potassium of 5 creatinine of 12.4 high-sensitivity troponin was 68 and 106 hemoglobin 10.7 platelets 103 glucose 254 Covid test was negative.  Patient is admitted for acute pulmonary edema with hemoptysis and abnormal EKG.  Cardiology was consulted at this time they felt that patient's EKG changes and mildly elevated high-sensitivity troponin could be from demand ischemia from pulmonary edema.  Review of Systems: As per HPI, rest all negative.   Past Medical History:  Diagnosis Date   Back pain 03/12/2018   CAD (coronary artery disease)    a. underwent cath in 02/2016 after an abnormal nuc which showed 2V CAD in RCA and OM3 with no good targets for PCI. Medical managment recommended.    Chronic kidney disease    Diabetes mellitus without complication ([place])    type II - followed by Dr Chalmers Cater    Difficult intubation    ER intubation 2016   ESRD (end stage renal disease) ([place])    Tues, Th, Sat [place]   Fever 03/12/2018   Glaucoma    Hemodialysis patient [place])    has been on dialysis since approx 10/2016    Hypertension    followed by [facility]   Stroke  [facility])    patient denies on 02/19/2017     Past Surgical History:  Procedure Laterality Date   A/V FISTULAGRAM Left 11/27/2016   Procedure: A/V Fistulagram;  Surgeon: Waynetta Sandy, MD;  Location: [place] [facility];  Service: Cardiovascular;  Laterality: Left;   A/V FISTULAGRAM Left 05/07/2017   Procedure: A/V FISTULAGRAM;  Surgeon: Waynetta Sandy, MD;  Location: [place] [facility];  Service: Cardiovascular;  Laterality: Left;   A/V FISTULAGRAM Left 07/14/2017   Procedure: A/V FISTULAGRAM;  Surgeon: Waynetta Sandy, MD;  Location: [place] [facility];  Service: Cardiovascular;  Laterality: Left;   AV FISTULA PLACEMENT Left 03/12/2016   Procedure: Left Arm ARTERIOVENOUS (AV) FISTULA CREATION;  Surgeon: Waynetta Sandy, MD;  Location: [place];  Service: Vascular;  Laterality: Left;   AV FISTULA PLACEMENT Left 07/18/2017   Procedure: INSERTION OF ARTERIOVENOUS (AV) GORE-TEX GRAFT ARM LEFT UPPER ARM;  Surgeon: Conrad , MD;  Location: [place];  Service: Vascular;  Laterality: Left;   [place] Left 05/14/2016   Procedure: SECOND STAGE LEFT BASILIC VEIN TRANSPOSITION;  Surgeon: Waynetta Sandy, MD;  Location: [place];  Service: Vascular;  Laterality: Left;   BRONCHOSCOPY  02/14/2015   for pulm hemorrhage   CARDIAC CATHETERIZATION N/A 03/04/2016   Procedure: Left Heart Cath and Coronary Angiography;  Surgeon: Leonie Man, MD;  Location: [place] [facility];  Service: Cardiovascular;  Laterality: N/A;  COLONOSCOPY WITH PROPOFOL N/A 04/07/2017   Procedure: COLONOSCOPY WITH PROPOFOL;  Surgeon: Ronnette Juniper, MD;  Location: [place];  Service: Gastroenterology;  Laterality: N/A;   EYE SURGERY Left    INSERTION OF DIALYSIS CATHETER Right 11/08/2016   Procedure: INSERTION OF DIALYSIS CATHETER;  Surgeon: Rosetta Posner, MD;  Location: [place];  Service: Vascular;  Laterality: Right;   IR PARACENTESIS  11/13/2016   LIGATION OF  ARTERIOVENOUS  FISTULA Left 07/18/2017   Procedure: LIGATION OF ARTERIOVENOUS  FISTULA;  Surgeon: Conrad [place], MD;  Location: [place];  Service: Vascular;  Laterality: Left;   PERIPHERAL VASCULAR BALLOON ANGIOPLASTY Left 11/27/2016   Procedure: Peripheral Vascular Balloon Angioplasty;  Surgeon: Waynetta Sandy, MD;  Location: [place] [facility];  Service: Cardiovascular;  Laterality: Left;  ARM FISTULA   PERIPHERAL VASCULAR BALLOON ANGIOPLASTY Left 05/07/2017   Procedure: PERIPHERAL VASCULAR BALLOON ANGIOPLASTY;  Surgeon: Waynetta Sandy, MD;  Location: [place] [facility];  Service: Cardiovascular;  Laterality: Left;     reports that he quit smoking about 36 years ago. He smoked 0.00 packs per day for 4.00 years. He has never used smokeless tobacco. He reports that he does not drink alcohol or use drugs.  No Known Allergies  Family History  Problem Relation Age of Onset   Diabetes Mother     Prior to Admission medications   Medication Sig Start Date End Date Taking? Authorizing Provider  ACCU-CHEK SOFTCLIX LANCETS lancets Use as instructed 3 times daily before meals and at bedtime. E11.9 07/20/18   Charlott Rakes, MD  Amino Acids-Protein Hydrolys (FEEDING SUPPLEMENT, PRO-STAT SUGAR FREE 64,) LIQD Take 30 mLs by mouth 2 (two) times daily. 03/21/18   Kayleen Memos, DO  amLODipine (NORVASC) 10 MG tablet Take 1 tablet (10 mg total) by mouth daily. 02/08/19   Charlott Rakes, MD  aspirin 81 MG chewable tablet Chew 1 tablet (81 mg total) by mouth daily. Patient taking differently: Chew 81 mg by mouth every evening.  09/14/15   Angiulli, Lavon Paganini, PA-C  atorvastatin (LIPITOR) 40 MG tablet Take 1 tablet (40 mg total) by mouth daily at 6 PM. 02/08/19   Charlott Rakes, MD  b complex-vitamin c-folic acid (NEPHRO-VITE) 0.8 MG TABS tablet Take 1 tablet by mouth at bedtime.    [provider]  bisacodyl (DULCOLAX) 10 MG suppository Place 1 suppository (10 mg total) rectally  daily as needed for moderate constipation. 03/21/18   Kayleen Memos, DO  Blood Glucose Monitoring Suppl (ACCU-CHEK AVIVA) device Use as instructed 3 times daily before meals and at bedtime. E11.9 07/20/18   Charlott Rakes, MD  calcitRIOL (ROCALTROL) 0.25 MCG capsule Take 1 capsule (0.25 mcg total) by mouth daily. Patient not taking: Reported on 05/04/2018 07/15/17   Charlott Rakes, MD  carvedilol (COREG) 6.25 MG tablet Take 1 tablet (6.25 mg total) by mouth 2 (two) times daily with a meal. 02/08/19   Charlott Rakes, MD  cyclobenzaprine (FLEXERIL) 10 MG tablet Take 1 tablet (10 mg total) by mouth 3 (three) times daily as needed for up to 30 doses for muscle spasms. Patient not taking: Reported on 05/04/2018 03/18/18   Damita Lack, MD  Darbepoetin Alfa (ARANESP) 150 MCG/0.3ML SOSY injection Inject 0.3 mLs (150 mcg total) into the vein every Saturday with hemodialysis. Patient not taking: Reported on 07/20/2018 11/16/16   Jani Gravel, MD  furosemide (LASIX) 80 MG tablet Take 1 tablet (80 mg total) by mouth 2 (two) times daily. 02/08/19  Charlott Rakes, MD  gabapentin (NEURONTIN) 300 MG capsule Take 1 capsule (300 mg total) by mouth daily. 02/08/19   Charlott Rakes, MD  glucose blood (ACCU-CHEK AVIVA PLUS) test strip Use 4 times daily before meals and at bedtime. E11.9 02/11/19   Charlott Rakes, MD  hydrocortisone (ANUSOL-HC) 2.5 % rectal cream Place rectally 2 (two) times daily. Patient not taking: Reported on 07/20/2018 11/16/16   Jani Gravel, MD  insulin aspart (NOVOLOG) 100 UNIT/ML injection 0-12 units 3 times daily before meals as well as per sliding scale 11/02/18   Charlott Rakes, MD  insulin glargine (LANTUS) 100 UNIT/ML injection Inject 0.1 mLs (10 Units total) into the skin 2 (two) times daily. 02/08/19   Charlott Rakes, MD  Lancet Devices [facility]) lancets Use as instructed 3 times daily before meals and at bedtime. E11.9 05/07/17   Charlott Rakes, MD  linaclotide (LINZESS) 72 MCG  capsule Take 1 capsule (72 mcg total) by mouth daily before breakfast. 04/20/18   Charlott Rakes, MD  Misc. Devices MISC Tub transfer bench. Dx: acute discitis and low back pain Patient not taking: Reported on 07/20/2018 04/20/18   Charlott Rakes, MD  Nutritional Supplements (FEEDING SUPPLEMENT, NEPRO CARB STEADY,) LIQD Take 237 mLs by mouth 2 (two) times daily between meals. Patient not taking: Reported on 07/20/2018 11/16/16   Jani Gravel, MD  ondansetron (ZOFRAN) 4 MG tablet Take 1 tablet (4 mg total) by mouth every 8 (eight) hours as needed for nausea or vomiting. Patient not taking: Reported on 05/04/2018 03/14/17   Charlott Rakes, MD  polyethylene glycol (MIRALAX / GLYCOLAX) packet Take 17 g by mouth daily. Patient not taking: Reported on 07/20/2018 04/20/18   Charlott Rakes, MD  RENAGEL 800 MG tablet Take 800-1,600 mg by mouth See admin instructions. Take 1600 mg by mouth 3 times daily with meals and take 800 mg by mouth twice daily with snacks 05/23/17   [provider]  sildenafil (VIAGRA) 50 MG tablet Take 1 tablet (50 mg total) by mouth daily as needed for erectile dysfunction. 11/02/18   Charlott Rakes, MD  timolol (TIMOPTIC) 0.5 % ophthalmic solution Place 1 drop into the left eye 2 (two) times daily.    [provider]    Physical Exam: Constitutional: Moderately built and nourished. Vitals:   03/31/19 1930 03/31/19 1945 03/31/19 2000 03/31/19 2209  BP: (!) 146/46 108/75 (!) 140/55 (!) 128/50  Pulse: 81 84 81 86  Resp: 18 (!) 26 (!) 27 (!) 30  Temp:      TempSrc:      SpO2: 91% 95% 94% 93%   Eyes: Anicteric no pallor. ENMT: No discharge from the ears eyes nose or mouth. Neck: No mass felt.  No neck rigidity. Respiratory: No rhonchi or crepitations. Cardiovascular: S1-S2 heard. Abdomen: Soft nontender bowel sounds present. Musculoskeletal: No edema. Skin: No rash. Neurologic: Alert awake oriented to time place and person.  Moves all extremities. Psychiatric:  Appears normal.   Labs on Admission: I have personally reviewed following labs and imaging studies  CBC: Recent Labs  Lab 03/31/19 1708  WBC 9.5  HGB 10.7*  HCT 31.8*  MCV 96.7  PLT [id number]*   Basic Metabolic Panel: Recent Labs  Lab 03/31/19 1708 03/31/19 2217  NA 135  --   K 5.0 5.8*  CL 99  --   CO2 18*  --   GLUCOSE 254*  --   BUN 122*  --   CREATININE 12.46*  --   CALCIUM 9.2  --  GFR: CrCl cannot be calculated (Unknown ideal weight.). Liver Function Tests: No results for input(s): AST, ALT, ALKPHOS, BILITOT, PROT, ALBUMIN in the last 168 hours. No results for input(s): LIPASE, AMYLASE in the last 168 hours. No results for input(s): AMMONIA in the last 168 hours. Coagulation Profile: Recent Labs  Lab 03/31/19 2217  INR 1.2   Cardiac Enzymes: No results for input(s): CKTOTAL, CKMB, CKMBINDEX, TROPONINI in the last 168 hours. BNP (last 3 results) No results for input(s): PROBNP in the last 8760 hours. HbA1C: No results for input(s): HGBA1C in the last 72 hours. CBG: Recent Labs  Lab 03/31/19 1752  GLUCAP 232*   Lipid Profile: No results for input(s): CHOL, HDL, LDLCALC, TRIG, CHOLHDL, LDLDIRECT in the last 72 hours. Thyroid Function Tests: No results for input(s): TSH, T4TOTAL, FREET4, T3FREE, THYROIDAB in the last 72 hours. Anemia Panel: No results for input(s): VITAMINB12, FOLATE, FERRITIN, TIBC, IRON, RETICCTPCT in the last 72 hours. Urine analysis:    Component Value Date/Time   COLORURINE YELLOW 03/16/2018 1752   APPEARANCEUR HAZY (A) 03/16/2018 1752   LABSPEC 1.012 03/16/2018 1752   PHURINE 6.0 03/16/2018 1752   GLUCOSEU NEGATIVE 03/16/2018 1752   HGBUR SMALL (A) 03/16/2018 1752   BILIRUBINUR NEGATIVE 03/16/2018 1752   BILIRUBINUR negative 02/27/2015 1458   BILIRUBINUR neg 02/22/2013 1737   KETONESUR NEGATIVE 03/16/2018 1752   PROTEINUR 100 (A) 03/16/2018 1752   UROBILINOGEN 0.2 02/28/2015 0032   NITRITE NEGATIVE 03/16/2018 1752    LEUKOCYTESUR TRACE (A) 03/16/2018 1752   Sepsis Labs: @LABRCNTIP (procalcitonin:4,lacticidven:4) )No results found for this or any previous visit (from the past 240 hour(s)).   Radiological Exams on Admission: Ct Angio Chest Pe W/cm &/or Wo Cm  Result Date: 03/31/2019 CLINICAL DATA:  Hemoptysis. EXAM: CT ANGIOGRAPHY CHEST WITH CONTRAST TECHNIQUE: Multidetector CT imaging of the chest was performed using the standard protocol during bolus administration of intravenous contrast. Multiplanar CT image reconstructions and MIPs were obtained to evaluate the vascular anatomy. CONTRAST:  27mL OMNIPAQUE IOHEXOL 350 MG/ML SOLN COMPARISON:  Chest x-ray dated 03/31/2019 and chest CT dated 02/14/2015 FINDINGS: Cardiovascular: Satisfactory opacification of the pulmonary arteries to the segmental level. No evidence of pulmonary embolism. Normal heart size. No pericardial effusion. Aortic atherosclerosis. Extensive coronary artery calcifications, progressed since the prior study. Stent in the left innominate and subclavian veins. Mediastinum/Nodes: Patient has progressive mediastinal and bilateral hilar adenopathy as well as multiple small lymph nodes in the axillae, increased in size since the prior study. The largest lymph node is approximately 10 mm in diameter in the precarinal region. Lungs/Pleura: There are hazy bilateral pulmonary infiltrates consistent with pulmonary edema. Small bilateral pleural effusions, right larger than left. Upper Abdomen: No acute abnormalities. Musculoskeletal: No chest wall abnormality. No acute or significant osseous findings. Review of the MIP images confirms the above findings. IMPRESSION: 1. No pulmonary emboli. 2. Bilateral pulmonary edema with small pleural effusions. 3. Chronic progressive slight mediastinal and hilar adenopathy, nonspecific. Electronically Signed   By: Lorriane Shire M.D.   On: 03/31/2019 20:53   Dg Chest Port 1 View  Result Date: 03/31/2019 CLINICAL DATA:   Hemoptysis. Productive cough. Blood tinged sputum. EXAM: PORTABLE CHEST 1 VIEW COMPARISON:  Chest radiograph 02/06/2017 FINDINGS: Mild cardiomegaly. Vascular stent in the region of the left subclavian and left axilla. Central perivascular haziness suspicious for pulmonary edema, additional are Kerley B-lines. More confluent opacities in the left perihilar, infrahilar, and right perihilar lung. No large pleural effusion. No pneumothorax. No acute osseous abnormalities are  seen. IMPRESSION: 1. Cardiomegaly with pulmonary edema. 2. More confluent bilateral infrahilar and left perihilar opacities may represent confluent edema or superimposed infection. Electronically Signed   By: Keith Rake M.D.   On: 03/31/2019 18:35    EKG: Independently reviewed.  Normal sinus rhythm with peaked T waves in ST depression in inferolateral leads.  Assessment/Plan Principal Problem:   Acute pulmonary edema (HCC) Active Problems:   Anemia of chronic disease   Diabetes mellitus type 2 in nonobese (HCC)   ESRD (end stage renal disease) (HCC)   CAD (coronary artery disease)   Hemoptysis    1. Acute respiratory failure with hypoxia secondary to acute pulmonary edema due to missed dialysis -shortly after examining patient became very short of breath requiring 100% nonrebreather and I reconsulted nephrology and urgent dialysis has been arranged. 2. Hemoptysis -likely from pulmonary edema with pink frothy sputum.  No signs of infection in the CAT scan done.  Closely observe.  Patient afebrile.  If hemoptysis persist after dialysis will need pulmonary consult. 3. Hypertension on amlodipine and Coreg.  Blood pressures in the low normal. 4. Diabetes mellitus type 2 on Lantus insulin sliding scale coverage. 5. Anemia and thrombocytopenia appears to be chronic follow CBC. 6. History of CAD with the mild elevated troponin.  Will trend troponin.  Appreciate cardiology consult.  If patient does not have any worsening anemia or  hemoptysis continue aspirin.  Patient is on Coreg and statins. 7. Hilar and mediastinal lymphadenopathy seen in the CAT scan will need further work-up as outpatient.   DVT prophylaxis: SCDs because patient was having hemoptysis.  Changed to heparin if no further episodes of hemoptysis after dialysis. Code Status: Full code. Family Communication: Patient's wife. Disposition Plan: Home. Consults called: Pulmonary and cardiology. Admission status: Observation.   Rise Patience MD [facility] Pager [phone].  If 7PM-7AM, please contact night-coverage [email or web address] Password [email or web address]  03/31/2019, 10:59 PM

## 2019-03-31 NOTE — ED Notes (Signed)
PAGED DR K TO RN TINA--Joleen Stuckert

## 2019-03-31 NOTE — Consult Note (Signed)
Reason for Consult: ESRD Referring Physician:  Dr Johnney Killian  Chief Complaint:  Hemoptysis  TTS - NW  4.25hrs, BFR 400, DFR 800,  EDW 71kg, 2K/ 2.25Ca  Access: LU AVG  Heparin 5000 unit bolus, 1500 mid run Hectorol 4 mcg IV qHD  Mircera 60 q week last 10/29  Assessment/Plan: 1. Hemoptysis - r/o PE, may be from heart failure with frothy pink sputum? 2. ESRD -  On HD TTS schedule.  HD today per regular schedule. K 5 -> waiting for COVID test results. Plan on HD in the AM. 3.  Hypertension/volume  - BP elevated. On norvasc and coreg.  Expect improvement post HD.  If weights correct, under edw.  Getting under at OP center, will likely need new edw at d/c. BP better in HD  4.  Anemia of CKD - Hgb 10.7. Follow trends. 5.  Secondary Hyperparathyroidism -  Ca at goal. Will check phos and adjust binders.  Continue VDRA, binders. Hectorol and renvela  6.  Nutrition - Renal diet w/fluid restrictions.  7. DMT2    HPI: Russell Price is an 58 y.o. male ESRD 2/2 DM on HD TTS at [facility], first starting in 10/2016.  Past medical history significant for CAD, DMT2 w/neuropathy, HTN, dCHF (EF 55%), thrombocytopenia (HIT negative), Hx ascites, glaucoma and blindness in R eye. He is coming to the ED because of dyspnea and also hemoptysis which started this AM; he denies fever, chills, n/v/d, fever, sick contacts, sore throat,  or myalgias.  His last dialysis treatment was last Thursday 03/25/19 and he tolerated a full treatment leaving 0.5kg above his EDW. He was found to be thrombosed and unfortunately was not declotted till Tuesday at [facility] by Dr. Posey Pronto.  In the ED he was noted to have peaked t-waves and ST segment depression but K was only 5 with a HCO# of 18, BUN Cr was 122/12.46. CXR showed CM with pulmonary edema.    ROS Pertinent items are noted in HPI.  Chemistry and CBC: Creat  Date/Time Value Ref Range Status  04/06/2018 04:44 PM 6.11 (H) 0.70 - 1.33 mg/dL Final    Comment:    For  patients >67 years of age, the reference limit for Creatinine is approximately 13% higher for people identified as African-American. .   03/01/2016 08:56 AM 4.38 (H) 0.70 - 1.33 mg/dL Final    Comment:      For patients > or = 60 years of age: The upper reference limit for Creatinine is approximately 13% higher for people identified as African-American.     10/26/2015 10:41 AM 4.20 (H) 0.70 - 1.33 mg/dL Final  09/21/2015 11:29 AM 3.82 (H) 0.70 - 1.33 mg/dL Final  07/24/2015 04:08 PM 3.70 (H) 0.70 - 1.33 mg/dL Final  07/20/2015 01:50 PM 3.62 (H) 0.70 - 1.33 mg/dL Final  03/27/2015 11:38 AM 2.55 (H) 0.70 - 1.33 mg/dL Final  03/07/2015 12:17 PM 2.89 (H) 0.70 - 1.33 mg/dL Final  02/27/2015 02:52 PM 3.03 (H) 0.70 - 1.33 mg/dL Final  02/03/2015 05:00 PM 4.14 (H) 0.70 - 1.33 mg/dL Final  11/09/2014 03:56 PM 2.66 (H) 0.50 - 1.35 mg/dL Final  09/12/2013 11:53 AM 2.43 (H) 0.50 - 1.35 mg/dL Final  05/29/2013 02:36 PM 2.13 (H) 0.50 - 1.35 mg/dL Final   Creatinine, Ser  Date/Time Value Ref Range Status  03/31/2019 05:08 PM 12.46 (H) 0.61 - 1.24 mg/dL Final  03/21/2018 05:54 AM 4.92 (H) 0.61 - 1.24 mg/dL Final  03/20/2018 04:47 AM 7.20 (H)  0.61 - 1.24 mg/dL Final  03/19/2018 04:53 AM 6.06 (H) 0.61 - 1.24 mg/dL Final  03/18/2018 06:57 AM 4.70 (H) 0.61 - 1.24 mg/dL Final    Comment:    DELTA CHECK NOTED  03/17/2018 07:11 AM 7.36 (H) 0.61 - 1.24 mg/dL Final  03/16/2018 05:33 AM 6.02 (H) 0.61 - 1.24 mg/dL Final  03/14/2018 02:40 PM 7.40 (H) 0.61 - 1.24 mg/dL Final  03/13/2018 11:58 AM 5.74 (H) 0.61 - 1.24 mg/dL Final  03/12/2018 07:28 PM 3.85 (H) 0.61 - 1.24 mg/dL Final  07/21/2017 08:51 PM 6.37 (H) 0.61 - 1.24 mg/dL Final  07/14/2017 06:24 AM 6.10 (H) 0.61 - 1.24 mg/dL Final  05/07/2017 10:13 AM 5.20 (H) 0.61 - 1.24 mg/dL Final  02/08/2017 07:56 AM 4.39 (H) 0.61 - 1.24 mg/dL Final  02/07/2017 05:01 AM 5.72 (H) 0.61 - 1.24 mg/dL Final  02/06/2017 02:55 PM 5.22 (H) 0.61 - 1.24 mg/dL Final   11/27/2016 08:54 AM 2.90 (H) 0.61 - 1.24 mg/dL Final  11/16/2016 09:25 AM 5.14 (H) 0.61 - 1.24 mg/dL Final  11/16/2016 03:30 AM 5.01 (H) 0.61 - 1.24 mg/dL Final  11/14/2016 06:46 AM 4.99 (H) 0.61 - 1.24 mg/dL Final  11/14/2016 04:18 AM 4.93 (H) 0.61 - 1.24 mg/dL Final  11/13/2016 02:37 AM 5.31 (H) 0.61 - 1.24 mg/dL Final  11/11/2016 03:53 AM 5.73 (H) 0.61 - 1.24 mg/dL Final  11/10/2016 02:28 PM 5.17 (H) 0.61 - 1.24 mg/dL Final  11/10/2016 08:01 AM 5.12 (H) 0.61 - 1.24 mg/dL Final  11/10/2016 03:04 AM 4.84 (H) 0.61 - 1.24 mg/dL Final  11/09/2016 08:22 PM 4.49 (H) 0.61 - 1.24 mg/dL Final  11/09/2016 03:35 PM 4.22 (H) 0.61 - 1.24 mg/dL Final  11/09/2016 08:30 AM 5.73 (H) 0.61 - 1.24 mg/dL Final  11/09/2016 02:17 AM 5.63 (H) 0.61 - 1.24 mg/dL Final  11/08/2016 09:11 PM 5.49 (H) 0.61 - 1.24 mg/dL Final  11/08/2016 04:37 PM 5.25 (H) 0.61 - 1.24 mg/dL Final  11/08/2016 08:48 AM 5.40 (H) 0.61 - 1.24 mg/dL Final  11/08/2016 02:29 AM 5.49 (H) 0.61 - 1.24 mg/dL Final  11/07/2016 10:55 PM 5.48 (H) 0.61 - 1.24 mg/dL Final  11/07/2016 06:35 PM 5.35 (H) 0.61 - 1.24 mg/dL Final  11/07/2016 07:40 AM 5.18 (H) 0.61 - 1.24 mg/dL Final  11/07/2016 02:41 AM 6.18 (H) 0.61 - 1.24 mg/dL Final  11/06/2016 08:46 PM 6.22 (H) 0.61 - 1.24 mg/dL Final  11/06/2016 04:39 PM 6.31 (H) 0.61 - 1.24 mg/dL Final  11/06/2016 10:47 AM 6.42 (H) 0.61 - 1.24 mg/dL Final  11/06/2016 08:56 AM 6.64 (H) 0.61 - 1.24 mg/dL Final  11/05/2016 10:50 PM 6.85 (H) 0.61 - 1.24 mg/dL Final  09/02/2016 11:58 AM 5.43 (H) 0.76 - 1.27 mg/dL Final  03/05/2016 10:00 AM 4.64 (H) 0.61 - 1.24 mg/dL Final  03/04/2016 02:30 AM 4.19 (H) 0.61 - 1.24 mg/dL Final  03/03/2016 04:10 PM 4.47 (H) 0.61 - 1.24 mg/dL Final  09/16/2015 06:42 AM 3.98 (H) 0.61 - 1.24 mg/dL Final  09/15/2015 07:29 AM 4.10 (H) 0.61 - 1.24 mg/dL Final  09/14/2015 05:57 AM 4.03 (H) 0.61 - 1.24 mg/dL Final  09/12/2015 07:11 AM 3.71 (H) 0.61 - 1.24 mg/dL Final  09/11/2015 08:19 AM  3.67 (H) 0.61 - 1.24 mg/dL Final   Recent Labs  Lab 03/31/19 1708  NA 135  K 5.0  CL 99  CO2 18*  GLUCOSE 254*  BUN 122*  CREATININE 12.46*  CALCIUM 9.2   Recent Labs  Lab 03/31/19 1708  WBC 9.5  HGB 10.7*  HCT 31.8*  MCV 96.7  PLT 103*   Liver Function Tests: No results for input(s): AST, ALT, ALKPHOS, BILITOT, PROT, ALBUMIN in the last 168 hours. No results for input(s): LIPASE, AMYLASE in the last 168 hours. No results for input(s): AMMONIA in the last 168 hours. Cardiac Enzymes: No results for input(s): CKTOTAL, CKMB, CKMBINDEX, TROPONINI in the last 168 hours. Iron Studies: No results for input(s): IRON, TIBC, TRANSFERRIN, FERRITIN in the last 72 hours. PT/INR: @LABRCNTIP (inr:5)  Xrays/Other Studies: ) Results for orders placed or performed during the hospital encounter of 03/31/19 (from the past 48 hour(s))  Basic metabolic panel     Status: Abnormal   Collection Time: 03/31/19  5:08 PM  Result Value Ref Range   Sodium 135 135 - 145 mmol/L   Potassium 5.0 3.5 - 5.1 mmol/L   Chloride 99 98 - 111 mmol/L   CO2 18 (L) 22 - 32 mmol/L   Glucose, Bld 254 (H) 70 - 99 mg/dL   BUN 122 (H) 6 - 20 mg/dL   Creatinine, Ser 12.46 (H) 0.61 - 1.24 mg/dL   Calcium 9.2 8.9 - 10.3 mg/dL   GFR calc non Af Amer 4 (L) >60 mL/min   GFR calc Af Amer 4 (L) >60 mL/min   Anion gap 18 (H) 5 - 15    Comment: Performed at [facility], [address], [place], [place] [address]  CBC     Status: Abnormal   Collection Time: 03/31/19  5:08 PM  Result Value Ref Range   WBC 9.5 4.0 - 10.5 K/uL   RBC 3.29 (L) 4.22 - 5.81 MIL/uL   Hemoglobin 10.7 (L) 13.0 - 17.0 g/dL   HCT 31.8 (L) 39.0 - 52.0 %   MCV 96.7 80.0 - 100.0 fL   MCH 32.5 26.0 - 34.0 pg   MCHC 33.6 30.0 - 36.0 g/dL   RDW 15.9 (H) 11.5 - 15.5 %   Platelets 103 (L) 150 - 400 K/uL    Comment: Immature Platelet Fraction may be clinically indicated, consider ordering this additional test [id number]    nRBC 0.0 0.0 - 0.2  %    Comment: Performed at [facility], [place] [address], [place], [place] [address]  Troponin I (High Sensitivity)     Status: Abnormal   Collection Time: 03/31/19  5:17 PM  Result Value Ref Range   Troponin I (High Sensitivity) 68 (H) <18 ng/L    Comment: (NOTE) Elevated high sensitivity troponin I (hsTnI) values and significant  changes across serial measurements may suggest ACS but many other  chronic and acute conditions are known to elevate hsTnI results.  Refer to the "Links" section for chest pain algorithms and additional  guidance. Performed at [facility], [place] [address], [place], [place] [address]   CBG monitoring, ED     Status: Abnormal   Collection Time: 03/31/19  5:52 PM  Result Value Ref Range   Glucose-Capillary 232 (H) 70 - 99 mg/dL   Comment 1 Notify RN    Comment 2 Document in Chart    Dg Chest Port 1 View  Result Date: 03/31/2019 CLINICAL DATA:  Hemoptysis. Productive cough. Blood tinged sputum. EXAM: PORTABLE CHEST 1 VIEW COMPARISON:  Chest radiograph 02/06/2017 FINDINGS: Mild cardiomegaly. Vascular stent in the region of the left subclavian and left axilla. Central perivascular haziness suspicious for pulmonary edema, additional are Kerley B-lines. More confluent opacities in the left perihilar, infrahilar, and right perihilar lung.  No large pleural effusion. No pneumothorax. No acute osseous abnormalities are seen. IMPRESSION: 1. Cardiomegaly with pulmonary edema. 2. More confluent bilateral infrahilar and left perihilar opacities may represent confluent edema or superimposed infection. Electronically Signed   By: Keith Rake M.D.   On: 03/31/2019 18:35    PMH:   Past Medical History:  Diagnosis Date  . Back pain 03/12/2018  . CAD (coronary artery disease)    a. underwent cath in 02/2016 after an abnormal nuc which showed 2V CAD in RCA and OM3 with no good targets for PCI. Medical managment recommended.   . Chronic kidney disease   .  Diabetes mellitus without complication ([place])    type II - followed by Dr Chalmers Cater   . Difficult intubation    ER intubation 2016  . ESRD (end stage renal disease) ([place])    Tues, Th, Sat [place]  . Fever 03/12/2018  . Glaucoma   . Hemodialysis patient [facility])    has been on dialysis since approx 10/2016   . Hypertension    followed by [facility]  . Stroke [facility])    patient denies on 02/19/2017     PSH:   Past Surgical History:  Procedure Laterality Date  . A/V FISTULAGRAM Left 11/27/2016   Procedure: A/V Fistulagram;  Surgeon: Waynetta Sandy, MD;  Location: [place] [facility];  Service: Cardiovascular;  Laterality: Left;  . A/V FISTULAGRAM Left 05/07/2017   Procedure: A/V FISTULAGRAM;  Surgeon: Waynetta Sandy, MD;  Location: [place] [facility];  Service: Cardiovascular;  Laterality: Left;  . A/V FISTULAGRAM Left 07/14/2017   Procedure: A/V FISTULAGRAM;  Surgeon: Waynetta Sandy, MD;  Location: [place] [facility];  Service: Cardiovascular;  Laterality: Left;  . AV FISTULA PLACEMENT Left 03/12/2016   Procedure: Left Arm ARTERIOVENOUS (AV) FISTULA CREATION;  Surgeon: Waynetta Sandy, MD;  Location: [facility] OR;  Service: Vascular;  Laterality: Left;  . AV FISTULA PLACEMENT Left 07/18/2017   Procedure: INSERTION OF ARTERIOVENOUS (AV) GORE-TEX GRAFT ARM LEFT UPPER ARM;  Surgeon: Conrad Geraldine, MD;  Location: [place];  Service: Vascular;  Laterality: Left;  . BASCILIC VEIN TRANSPOSITION Left 05/14/2016   Procedure: SECOND STAGE LEFT BASILIC VEIN TRANSPOSITION;  Surgeon: Waynetta Sandy, MD;  Location: [place];  Service: Vascular;  Laterality: Left;  . BRONCHOSCOPY  02/14/2015   for pulm hemorrhage  . CARDIAC CATHETERIZATION N/A 03/04/2016   Procedure: Left Heart Cath and Coronary Angiography;  Surgeon: Leonie Man, MD;  Location: [place] [facility];  Service: Cardiovascular;  Laterality: N/A;  . COLONOSCOPY WITH PROPOFOL N/A 04/07/2017    Procedure: COLONOSCOPY WITH PROPOFOL;  Surgeon: Ronnette Juniper, MD;  Location: [place];  Service: Gastroenterology;  Laterality: N/A;  . EYE SURGERY Left   . INSERTION OF DIALYSIS CATHETER Right 11/08/2016   Procedure: INSERTION OF DIALYSIS CATHETER;  Surgeon: Rosetta Posner, MD;  Location: [place];  Service: Vascular;  Laterality: Right;  . IR PARACENTESIS  11/13/2016  . LIGATION OF ARTERIOVENOUS  FISTULA Left 07/18/2017   Procedure: LIGATION OF ARTERIOVENOUS  FISTULA;  Surgeon: Conrad Noble, MD;  Location: [place];  Service: Vascular;  Laterality: Left;  . PERIPHERAL VASCULAR BALLOON ANGIOPLASTY Left 11/27/2016   Procedure: Peripheral Vascular Balloon Angioplasty;  Surgeon: Waynetta Sandy, MD;  Location: [place] [facility];  Service: Cardiovascular;  Laterality: Left;  ARM FISTULA  . PERIPHERAL VASCULAR BALLOON ANGIOPLASTY Left 05/07/2017   Procedure: PERIPHERAL VASCULAR BALLOON ANGIOPLASTY;  Surgeon:  Waynetta Sandy, MD;  Location: [place] [facility];  Service: Cardiovascular;  Laterality: Left;    Allergies: No Known Allergies  Medications:   Prior to Admission medications   Medication Sig Start Date End Date Taking? Authorizing Provider  ACCU-CHEK SOFTCLIX LANCETS lancets Use as instructed 3 times daily before meals and at bedtime. E11.9 07/20/18   Charlott Rakes, MD  Amino Acids-Protein Hydrolys (FEEDING SUPPLEMENT, PRO-STAT SUGAR FREE 64,) LIQD Take 30 mLs by mouth 2 (two) times daily. 03/21/18   Kayleen Memos, DO  amLODipine (NORVASC) 10 MG tablet Take 1 tablet (10 mg total) by mouth daily. 02/08/19   Charlott Rakes, MD  aspirin 81 MG chewable tablet Chew 1 tablet (81 mg total) by mouth daily. Patient taking differently: Chew 81 mg by mouth every evening.  09/14/15   Angiulli, Lavon Paganini, PA-C  atorvastatin (LIPITOR) 40 MG tablet Take 1 tablet (40 mg total) by mouth daily at 6 PM. 02/08/19   Charlott Rakes, MD  b complex-vitamin c-folic acid (NEPHRO-VITE) 0.8 MG TABS  tablet Take 1 tablet by mouth at bedtime.    [provider]  bisacodyl (DULCOLAX) 10 MG suppository Place 1 suppository (10 mg total) rectally daily as needed for moderate constipation. 03/21/18   Kayleen Memos, DO  Blood Glucose Monitoring Suppl (ACCU-CHEK AVIVA) device Use as instructed 3 times daily before meals and at bedtime. E11.9 07/20/18   Charlott Rakes, MD  calcitRIOL (ROCALTROL) 0.25 MCG capsule Take 1 capsule (0.25 mcg total) by mouth daily. Patient not taking: Reported on 05/04/2018 07/15/17   Charlott Rakes, MD  carvedilol (COREG) 6.25 MG tablet Take 1 tablet (6.25 mg total) by mouth 2 (two) times daily with a meal. 02/08/19   Charlott Rakes, MD  cyclobenzaprine (FLEXERIL) 10 MG tablet Take 1 tablet (10 mg total) by mouth 3 (three) times daily as needed for up to 30 doses for muscle spasms. Patient not taking: Reported on 05/04/2018 03/18/18   Damita Lack, MD  Darbepoetin Alfa (ARANESP) 150 MCG/0.3ML SOSY injection Inject 0.3 mLs (150 mcg total) into the vein every Saturday with hemodialysis. Patient not taking: Reported on 07/20/2018 11/16/16   Jani Gravel, MD  furosemide (LASIX) 80 MG tablet Take 1 tablet (80 mg total) by mouth 2 (two) times daily. 02/08/19   Charlott Rakes, MD  gabapentin (NEURONTIN) 300 MG capsule Take 1 capsule (300 mg total) by mouth daily. 02/08/19   Charlott Rakes, MD  glucose blood (ACCU-CHEK AVIVA PLUS) test strip Use 4 times daily before meals and at bedtime. E11.9 02/11/19   Charlott Rakes, MD  hydrocortisone (ANUSOL-HC) 2.5 % rectal cream Place rectally 2 (two) times daily. Patient not taking: Reported on 07/20/2018 11/16/16   Jani Gravel, MD  insulin aspart (NOVOLOG) 100 UNIT/ML injection 0-12 units 3 times daily before meals as well as per sliding scale 11/02/18   Charlott Rakes, MD  insulin glargine (LANTUS) 100 UNIT/ML injection Inject 0.1 mLs (10 Units total) into the skin 2 (two) times daily. 02/08/19   Charlott Rakes, MD  Lancet Devices  [facility]) lancets Use as instructed 3 times daily before meals and at bedtime. E11.9 05/07/17   Charlott Rakes, MD  linaclotide (LINZESS) 72 MCG capsule Take 1 capsule (72 mcg total) by mouth daily before breakfast. 04/20/18   Charlott Rakes, MD  Misc. Devices MISC Tub transfer bench. Dx: acute discitis and low back pain Patient not taking: Reported on 07/20/2018 04/20/18   Charlott Rakes, MD  Nutritional Supplements (FEEDING SUPPLEMENT, NEPRO  CARB STEADY,) LIQD Take 237 mLs by mouth 2 (two) times daily between meals. Patient not taking: Reported on 07/20/2018 11/16/16   Jani Gravel, MD  ondansetron (ZOFRAN) 4 MG tablet Take 1 tablet (4 mg total) by mouth every 8 (eight) hours as needed for nausea or vomiting. Patient not taking: Reported on 05/04/2018 03/14/17   Charlott Rakes, MD  polyethylene glycol (MIRALAX / GLYCOLAX) packet Take 17 g by mouth daily. Patient not taking: Reported on 07/20/2018 04/20/18   Charlott Rakes, MD  RENAGEL 800 MG tablet Take 800-1,600 mg by mouth See admin instructions. Take 1600 mg by mouth 3 times daily with meals and take 800 mg by mouth twice daily with snacks 05/23/17   [provider]  sildenafil (VIAGRA) 50 MG tablet Take 1 tablet (50 mg total) by mouth daily as needed for erectile dysfunction. 11/02/18   Charlott Rakes, MD  timolol (TIMOPTIC) 0.5 % ophthalmic solution Place 1 drop into the left eye 2 (two) times daily.    [provider]    Discontinued Meds:  There are no discontinued medications.  Social History:  reports that he quit smoking about 36 years ago. He smoked 0.00 packs per day for 4.00 years. He has never used smokeless tobacco. He reports that he does not drink alcohol or use drugs.  Family History:   Family History  Problem Relation Age of Onset  . Diabetes Mother     Blood pressure (!) 163/77, pulse 79, temperature 98.1 F (36.7 C), temperature source Oral, resp. rate (!) 28, SpO2 94 %. General appearance: alert,  cooperative and appears stated age Head: Normocephalic, without obvious abnormality, atraumatic Eyes: rigth eye blindness Neck: no adenopathy, no carotid bruit, supple, symmetrical, trachea midline and thyroid not enlarged, symmetric, no tenderness/mass/nodules Back: symmetric, no curvature. ROM normal. No CVA tenderness. Resp: rales bibasilar and bilaterally Cardio: S1, S2 normal GI: soft, non-tender; bowel sounds normal; no masses,  no organomegaly Extremities: extremities normal, atraumatic, no cyanosis or edema Pulses: 2+ and symmetric Skin: Skin color, texture, turgor normal. No rashes or lesions Lymph nodes: Cervical adenopathy: none Access: LUA AVS + bruit       LIN, Hunt Oris, MD 03/31/2019, 8:39 PM

## 2019-03-31 NOTE — Consult Note (Addendum)
Cardiology Consultation   Patient ID: Russell Price; [id number]; December 06, 1960   Admit date: 03/31/2019 Date of Consult: 03/31/2019  Referring Provider:  Charlesetta Shanks  Primary Care Provider: Charlott Rakes, MD Cardiologist: Sanda Klein Electrophysiologist:  NA  Reason for Consultation: abnormal HS troponin  History of Present Illness: Russell Price is a 58 y.o. male who is being seen today for the evaluation of abnormal HS troponin and abnormal EKG at the request of the [facility] ED. Pt came in c/o hemoptysis; he has ESRD and is on hemodialysis, and has recently had issues w/ his dialysis access. He has missed 2 dialysis treatments after having his fistula clot off; his last dialysis treatment was 03-25-19. He has had some worsening SOB since missing his dialysis treatments, and has developed a cough over the past few days w/ bloody sputum per his report.  Upon presentation to the ED, HS troponin was checked and has gone from 68--->106. EKG has some peaked T waves (initial K 5.0) and 0.5-53mm ST depression in inferior and anterolateral leads. Pt is known to have CAD; he has significant lesions in both RCA and OM3, and apparently neither is amenable to revascularization (small vessels) per the cath report from October 2017. Medical management recommended.  Past Medical History:  Diagnosis Date   Back pain 03/12/2018   CAD (coronary artery disease)    a. underwent cath in 02/2016 after an abnormal nuc which showed 2V CAD in RCA and OM3 with no good targets for PCI. Medical managment recommended.    Chronic kidney disease    Diabetes mellitus without complication ([place])    type II - followed by Dr Chalmers Cater    Difficult intubation    ER intubation 2016   ESRD (end stage renal disease) ([place])    Tues, Th, Sat [place]   Fever 03/12/2018   Glaucoma    Hemodialysis patient [facility])    has been on dialysis since approx 10/2016    Hypertension    followed by  [facility]   Stroke [facility])    patient denies on 02/19/2017     Past Surgical History:  Procedure Laterality Date   A/V FISTULAGRAM Left 11/27/2016   Procedure: A/V Fistulagram;  Surgeon: Waynetta Sandy, MD;  Location: [place] [facility];  Service: Cardiovascular;  Laterality: Left;   A/V FISTULAGRAM Left 05/07/2017   Procedure: A/V FISTULAGRAM;  Surgeon: Waynetta Sandy, MD;  Location: [place] [facility];  Service: Cardiovascular;  Laterality: Left;   A/V FISTULAGRAM Left 07/14/2017   Procedure: A/V FISTULAGRAM;  Surgeon: Waynetta Sandy, MD;  Location: [place] [facility];  Service: Cardiovascular;  Laterality: Left;   AV FISTULA PLACEMENT Left 03/12/2016   Procedure: Left Arm ARTERIOVENOUS (AV) FISTULA CREATION;  Surgeon: Waynetta Sandy, MD;  Location: [place];  Service: Vascular;  Laterality: Left;   AV FISTULA PLACEMENT Left 07/18/2017   Procedure: INSERTION OF ARTERIOVENOUS (AV) GORE-TEX GRAFT ARM LEFT UPPER ARM;  Surgeon: Conrad Wailuku, MD;  Location: [place];  Service: Vascular;  Laterality: Left;   [place] Left 05/14/2016   Procedure: SECOND STAGE LEFT BASILIC VEIN TRANSPOSITION;  Surgeon: Waynetta Sandy, MD;  Location: [place];  Service: Vascular;  Laterality: Left;   BRONCHOSCOPY  02/14/2015   for pulm hemorrhage   CARDIAC CATHETERIZATION N/A 03/04/2016   Procedure: Left Heart Cath and Coronary Angiography;  Surgeon: Leonie Man, MD;  Location: [place] [facility];  Service: Cardiovascular;  Laterality: N/A;  COLONOSCOPY WITH PROPOFOL N/A 04/07/2017   Procedure: COLONOSCOPY WITH PROPOFOL;  Surgeon: Ronnette Juniper, MD;  Location: [place];  Service: Gastroenterology;  Laterality: N/A;   EYE SURGERY Left    INSERTION OF DIALYSIS CATHETER Right 11/08/2016   Procedure: INSERTION OF DIALYSIS CATHETER;  Surgeon: Rosetta Posner, MD;  Location: [place];  Service: Vascular;  Laterality: Right;   IR  PARACENTESIS  11/13/2016   LIGATION OF ARTERIOVENOUS  FISTULA Left 07/18/2017   Procedure: LIGATION OF ARTERIOVENOUS  FISTULA;  Surgeon: Conrad Cambria, MD;  Location: [place];  Service: Vascular;  Laterality: Left;   PERIPHERAL VASCULAR BALLOON ANGIOPLASTY Left 11/27/2016   Procedure: Peripheral Vascular Balloon Angioplasty;  Surgeon: Waynetta Sandy, MD;  Location: [place] [facility];  Service: Cardiovascular;  Laterality: Left;  ARM FISTULA   PERIPHERAL VASCULAR BALLOON ANGIOPLASTY Left 05/07/2017   Procedure: PERIPHERAL VASCULAR BALLOON ANGIOPLASTY;  Surgeon: Waynetta Sandy, MD;  Location: [place] [facility];  Service: Cardiovascular;  Laterality: Left;      Current Medications:  [START ON 04/01/2019] Chlorhexidine Gluconate Cloth  6 each Topical Q0600    Infused Medications:   PRN Medications:    Allergies:   No Known Allergies  Social History:   The patient  reports that he quit smoking about 36 years ago. He smoked 0.00 packs per day for 4.00 years. He has never used smokeless tobacco. He reports that he does not drink alcohol or use drugs.    Family History:   The patient's family history includes Diabetes in his mother.   ROS:  Please see the history of present illness.  All other ROS reviewed and negative.     Vital Signs: Blood pressure (!) 128/50, pulse 86, temperature 98.1 F (36.7 C), temperature source Oral, resp. rate (!) 30, SpO2 93 %.   PHYSICAL EXAM: General:  Well nourished, well developed, in no acute distress HEENT: normal Lymph: no adenopathy Neck: no JVD Endocrine:  No thryomegaly Vascular: No carotid bruits; DP pulses 2+ bilaterally   Cardiac:  normal S1, S2; RRR; no murmur  Lungs:  clear to auscultation bilaterally, no wheezing, rhonchi or rales  Abd: soft, nontender, no hepatomegaly  Ext: no edema Musculoskeletal:  No deformities, BUE and BLE strength normal and equal Skin: warm and dry  Neuro:  CNs 2-12 intact, no focal  abnormalities noted Psych:  Normal affect   EKG:  NSR; 0.5-96mm ST depression inferior, peaked T waves anterolateral w/ 0.60mm ST depression in anterolateral leads  Labs: No results for input(s): CKTOTAL, CKMB, TROPONINI in the last 72 hours. No results for input(s): TROPIPOC in the last 72 hours.  Lab Results  Component Value Date   WBC 9.5 03/31/2019   HGB 10.7 (L) 03/31/2019   HCT 31.8 (L) 03/31/2019   MCV 96.7 03/31/2019   PLT 103 (L) 03/31/2019   Recent Labs  Lab 03/31/19 1708  NA 135  K 5.0  CL 99  CO2 18*  BUN 122*  CREATININE 12.46*  CALCIUM 9.2  GLUCOSE 254*   Lab Results  Component Value Date   CHOL 89 (L) 02/08/2019   HDL 37 (L) 02/08/2019   LDLCALC 31 02/08/2019   TRIG 113 02/08/2019   No results found for: DDIMER  Radiology/Studies:  Ct Angio Chest Pe W/cm &/or Wo Cm  Result Date: 03/31/2019 CLINICAL DATA:  Hemoptysis. EXAM: CT ANGIOGRAPHY CHEST WITH CONTRAST TECHNIQUE: Multidetector CT imaging of the chest was performed using the standard protocol during bolus administration of intravenous  contrast. Multiplanar CT image reconstructions and MIPs were obtained to evaluate the vascular anatomy. CONTRAST:  66mL OMNIPAQUE IOHEXOL 350 MG/ML SOLN COMPARISON:  Chest x-ray dated 03/31/2019 and chest CT dated 02/14/2015 FINDINGS: Cardiovascular: Satisfactory opacification of the pulmonary arteries to the segmental level. No evidence of pulmonary embolism. Normal heart size. No pericardial effusion. Aortic atherosclerosis. Extensive coronary artery calcifications, progressed since the prior study. Stent in the left innominate and subclavian veins. Mediastinum/Nodes: Patient has progressive mediastinal and bilateral hilar adenopathy as well as multiple small lymph nodes in the axillae, increased in size since the prior study. The largest lymph node is approximately 10 mm in diameter in the precarinal region. Lungs/Pleura: There are hazy bilateral pulmonary infiltrates  consistent with pulmonary edema. Small bilateral pleural effusions, right larger than left. Upper Abdomen: No acute abnormalities. Musculoskeletal: No chest wall abnormality. No acute or significant osseous findings. Review of the MIP images confirms the above findings. IMPRESSION: 1. No pulmonary emboli. 2. Bilateral pulmonary edema with small pleural effusions. 3. Chronic progressive slight mediastinal and hilar adenopathy, nonspecific. Electronically Signed   By: Lorriane Shire M.D.   On: 03/31/2019 20:53   Dg Chest Port 1 View  Result Date: 03/31/2019 CLINICAL DATA:  Hemoptysis. Productive cough. Blood tinged sputum. EXAM: PORTABLE CHEST 1 VIEW COMPARISON:  Chest radiograph 02/06/2017 FINDINGS: Mild cardiomegaly. Vascular stent in the region of the left subclavian and left axilla. Central perivascular haziness suspicious for pulmonary edema, additional are Kerley B-lines. More confluent opacities in the left perihilar, infrahilar, and right perihilar lung. No large pleural effusion. No pneumothorax. No acute osseous abnormalities are seen. IMPRESSION: 1. Cardiomegaly with pulmonary edema. 2. More confluent bilateral infrahilar and left perihilar opacities may represent confluent edema or superimposed infection. Electronically Signed   By: Keith Rake M.D.   On: 03/31/2019 18:35   TTE 03-14-18 Left ventricle: The cavity size was normal. Wall thickness was   normal. Systolic function was normal. The estimated ejection   fraction was in the range of 60% to 65%. Wall motion was normal;   there were no regional wall motion abnormalities. Features are   consistent with a pseudonormal left ventricular filling pattern,   with concomitant abnormal relaxation and increased filling   pressure (grade 2 diastolic dysfunction). - Pulmonary arteries: PA peak pressure: 31 mm Hg (S).  LHC 03-04-16  Mid RCA lesion, 65 %stenosed. Dist RCA lesion, 85 %stenosed. Relatively small caliber vessel. Smaller  diameter than 5 French catheter  Prox LAD lesion, 45 %stenosed. Mid LAD lesion, 35 %stenosed.  3rd Mrg lesion, 70 %stenosed.  LV end diastolic pressure is moderately elevated.    Patient does have severe 2 vessel disease involving the RCA and OM 3. Both vessels are relatively small in caliber and not good PCI targets.  ASSESSMENT AND PLAN:  1. Abn troponin/abn EKG: pt has some SOB that may be the result of several missed sessions of hemodialysis. No other significant cardiac sx. OK to cycle enzymes, although pt has known RCA and OM3 lesions. Missing two dialysis sessions is creating a demand situation, causing some changes on EKG in inferior and lateral territories as well as troponin leak.  Given relative minimum of cardiac symptoms, and possibility of hemoptysis, would hold off on heparin unless trop continues to rise significantly. Will get repeat TTE to reeval LVEF but needs to be done after pt adequately dialyzed. Cont aggressive medical tx for secondary prevention  2. HTN: BP not significantly elevated. Restart home regimen and adjust PRN  3. DM2: mgmt as per primary medical team  Thank you for the opportunity to participate in the care of this patient.  For questions or updates, please contact [place] Please consult [email or web address] for contact info under   Signed, Rudean Curt, MD, [facility]  03/31/2019 10:22 PM   ADDENDUM: repeat K 5.8 (hyperkalemia contributing to EKG changes)

## 2019-03-31 NOTE — ED Notes (Signed)
Eric, NT went in room because patient had O2 sats of 68% on room air. Pt had vomited x1 (per NT states saw blood in vomit). Notified this nurse. Reassessment of pt indicated sats of 74% on 4lpm. O2 increased to 6lpm, sats @ 79%. [facility] paged. Pt placed on non-rebreather at 15lpm.

## 2019-03-31 NOTE — ED Notes (Signed)
Spoke with Dr. Hal Hope; new orders received.

## 2019-03-31 NOTE — ED Provider Notes (Signed)
[place] EMERGENCY DEPARTMENT Provider Note   CSN: [id number] Arrival date & time: 03/31/19  1636     History   Chief Complaint Chief Complaint  Patient presents with  . Hemoptysis    HPI Russell Price is a 58 y.o. male.     HPI Patient dialysis catheter was clotted on Saturday.  (5 days ago).  His usual schedule is Saturday Tuesday Thursday.  On Tuesday he went to vascular surgery for a thrombectomy of the graft in his left arm.  At this point, he has missed 2 dialysis sessions.  Patient is due again tomorrow.  He reports that he has had some cough develop over the past 2 days.  He reports today he started coughing up some blood with his cough.  He denies any seeing any sputum.  He reports he is just had some bright red blood.  No clots.  Patient did not take any photographs.  Patient denies chest pain.  He denies fever.  He reports he feels somewhat short of breath with exertion.  He denies he is getting any swelling in his legs or pain in his legs. Past Medical History:  Diagnosis Date  . Back pain 03/12/2018  . CAD (coronary artery disease)    a. underwent cath in 02/2016 after an abnormal nuc which showed 2V CAD in RCA and OM3 with no good targets for PCI. Medical managment recommended.   . Chronic kidney disease   . Diabetes mellitus without complication ([place])    type II - followed by Dr Chalmers Cater   . Difficult intubation    ER intubation 2016  . ESRD (end stage renal disease) ([place])    Tues, Th, Sat [place]  . Fever 03/12/2018  . Glaucoma   . Hemodialysis patient [facility])    has been on dialysis since approx 10/2016   . Hypertension    followed by [facility]  . Stroke [facility])    patient denies on 02/19/2017     Patient Active Problem List   Diagnosis Date Noted  . Breast mass in male 05/04/2018  . Polypharmacy 04/07/2018  . Chronic back pain 03/13/2018  . Bilateral leg weakness 03/13/2018  . Hyponatremia 11/06/2016   . Fluid overload 11/06/2016  . CAD (coronary artery disease)   . ESRD (end stage renal disease) ([place])   . Diabetic neuropathy ([place]) 10/05/2015  . Left arm weakness   . Hyperlipidemia 09/21/2015  . Labile blood pressure   . Labile blood glucose   . Diabetes mellitus type 2 in nonobese ([facility])   . Anemia of chronic disease   . Debility 09/08/2015  . Seizure-like activity ([place])   . Cerebral thrombosis with cerebral infarction ([place]) 02/18/2015  . Essential hypertension 11/11/2014  . DM (diabetes mellitus) with complications ([place]) [id number]  . Blind right eye 11/11/2014    Past Surgical History:  Procedure Laterality Date  . A/V FISTULAGRAM Left 11/27/2016   Procedure: A/V Fistulagram;  Surgeon: Waynetta Sandy, MD;  Location: [place] CV LAB;  Service: Cardiovascular;  Laterality: Left;  . A/V FISTULAGRAM Left 05/07/2017   Procedure: A/V FISTULAGRAM;  Surgeon: Waynetta Sandy, MD;  Location: [place] CV [facility];  Service: Cardiovascular;  Laterality: Left;  . A/V FISTULAGRAM Left 07/14/2017   Procedure: A/V FISTULAGRAM;  Surgeon: Waynetta Sandy, MD;  Location: [place] [facility];  Service: Cardiovascular;  Laterality: Left;  . AV FISTULA PLACEMENT Left 03/12/2016   Procedure: Left Arm ARTERIOVENOUS (AV) FISTULA  CREATION;  Surgeon: Waynetta Sandy, MD;  Location: [facility] OR;  Service: Vascular;  Laterality: Left;  . AV FISTULA PLACEMENT Left 07/18/2017   Procedure: INSERTION OF ARTERIOVENOUS (AV) GORE-TEX GRAFT ARM LEFT UPPER ARM;  Surgeon: Conrad [place], MD;  Location: [place];  Service: Vascular;  Laterality: Left;  . BASCILIC VEIN TRANSPOSITION Left 05/14/2016   Procedure: SECOND STAGE LEFT BASILIC VEIN TRANSPOSITION;  Surgeon: Waynetta Sandy, MD;  Location: [place];  Service: Vascular;  Laterality: Left;  . BRONCHOSCOPY  02/14/2015   for pulm hemorrhage  . CARDIAC CATHETERIZATION N/A 03/04/2016   Procedure: Left Heart Cath and Coronary  Angiography;  Surgeon: Leonie Man, MD;  Location: [place] CV LAB;  Service: Cardiovascular;  Laterality: N/A;  . COLONOSCOPY WITH PROPOFOL N/A 04/07/2017   Procedure: COLONOSCOPY WITH PROPOFOL;  Surgeon: Ronnette Juniper, MD;  Location: [place];  Service: Gastroenterology;  Laterality: N/A;  . EYE SURGERY Left   . INSERTION OF DIALYSIS CATHETER Right 11/08/2016   Procedure: INSERTION OF DIALYSIS CATHETER;  Surgeon: Rosetta Posner, MD;  Location: [place];  Service: Vascular;  Laterality: Right;  . IR PARACENTESIS  11/13/2016  . LIGATION OF ARTERIOVENOUS  FISTULA Left 07/18/2017   Procedure: LIGATION OF ARTERIOVENOUS  FISTULA;  Surgeon: Conrad Leon, MD;  Location: [place];  Service: Vascular;  Laterality: Left;  . PERIPHERAL VASCULAR BALLOON ANGIOPLASTY Left 11/27/2016   Procedure: Peripheral Vascular Balloon Angioplasty;  Surgeon: Waynetta Sandy, MD;  Location: [place] [facility];  Service: Cardiovascular;  Laterality: Left;  ARM FISTULA  . PERIPHERAL VASCULAR BALLOON ANGIOPLASTY Left 05/07/2017   Procedure: PERIPHERAL VASCULAR BALLOON ANGIOPLASTY;  Surgeon: Waynetta Sandy, MD;  Location: [place] [facility];  Service: Cardiovascular;  Laterality: Left;        Home Medications    Prior to Admission medications   Medication Sig Start Date End Date Taking? Authorizing Provider  ACCU-CHEK SOFTCLIX LANCETS lancets Use as instructed 3 times daily before meals and at bedtime. E11.9 07/20/18   Charlott Rakes, MD  Amino Acids-Protein Hydrolys (FEEDING SUPPLEMENT, PRO-STAT SUGAR FREE 64,) LIQD Take 30 mLs by mouth 2 (two) times daily. 03/21/18   Kayleen Memos, DO  amLODipine (NORVASC) 10 MG tablet Take 1 tablet (10 mg total) by mouth daily. 02/08/19   Charlott Rakes, MD  aspirin 81 MG chewable tablet Chew 1 tablet (81 mg total) by mouth daily. Patient taking differently: Chew 81 mg by mouth every evening.  09/14/15   Angiulli, Lavon Paganini, PA-C  atorvastatin (LIPITOR) 40 MG tablet  Take 1 tablet (40 mg total) by mouth daily at 6 PM. 02/08/19   Charlott Rakes, MD  b complex-vitamin c-folic acid (NEPHRO-VITE) 0.8 MG TABS tablet Take 1 tablet by mouth at bedtime.    [provider]  bisacodyl (DULCOLAX) 10 MG suppository Place 1 suppository (10 mg total) rectally daily as needed for moderate constipation. 03/21/18   Kayleen Memos, DO  Blood Glucose Monitoring Suppl (ACCU-CHEK AVIVA) device Use as instructed 3 times daily before meals and at bedtime. E11.9 07/20/18   Charlott Rakes, MD  calcitRIOL (ROCALTROL) 0.25 MCG capsule Take 1 capsule (0.25 mcg total) by mouth daily. Patient not taking: Reported on 05/04/2018 07/15/17   Charlott Rakes, MD  carvedilol (COREG) 6.25 MG tablet Take 1 tablet (6.25 mg total) by mouth 2 (two) times daily with a meal. 02/08/19   Charlott Rakes, MD  cyclobenzaprine (FLEXERIL) 10 MG tablet Take 1 tablet (10 mg total) by  mouth 3 (three) times daily as needed for up to 30 doses for muscle spasms. Patient not taking: Reported on 05/04/2018 03/18/18   Damita Lack, MD  Darbepoetin Alfa (ARANESP) 150 MCG/0.3ML SOSY injection Inject 0.3 mLs (150 mcg total) into the vein every Saturday with hemodialysis. Patient not taking: Reported on 07/20/2018 11/16/16   Jani Gravel, MD  furosemide (LASIX) 80 MG tablet Take 1 tablet (80 mg total) by mouth 2 (two) times daily. 02/08/19   Charlott Rakes, MD  gabapentin (NEURONTIN) 300 MG capsule Take 1 capsule (300 mg total) by mouth daily. 02/08/19   Charlott Rakes, MD  glucose blood (ACCU-CHEK AVIVA PLUS) test strip Use 4 times daily before meals and at bedtime. E11.9 02/11/19   Charlott Rakes, MD  hydrocortisone (ANUSOL-HC) 2.5 % rectal cream Place rectally 2 (two) times daily. Patient not taking: Reported on 07/20/2018 11/16/16   Jani Gravel, MD  insulin aspart (NOVOLOG) 100 UNIT/ML injection 0-12 units 3 times daily before meals as well as per sliding scale 11/02/18   Charlott Rakes, MD  insulin glargine (LANTUS)  100 UNIT/ML injection Inject 0.1 mLs (10 Units total) into the skin 2 (two) times daily. 02/08/19   Charlott Rakes, MD  Lancet Devices [facility]) lancets Use as instructed 3 times daily before meals and at bedtime. E11.9 05/07/17   Charlott Rakes, MD  linaclotide (LINZESS) 72 MCG capsule Take 1 capsule (72 mcg total) by mouth daily before breakfast. 04/20/18   Charlott Rakes, MD  Misc. Devices MISC Tub transfer bench. Dx: acute discitis and low back pain Patient not taking: Reported on 07/20/2018 04/20/18   Charlott Rakes, MD  Nutritional Supplements (FEEDING SUPPLEMENT, NEPRO CARB STEADY,) LIQD Take 237 mLs by mouth 2 (two) times daily between meals. Patient not taking: Reported on 07/20/2018 11/16/16   Jani Gravel, MD  ondansetron (ZOFRAN) 4 MG tablet Take 1 tablet (4 mg total) by mouth every 8 (eight) hours as needed for nausea or vomiting. Patient not taking: Reported on 05/04/2018 03/14/17   Charlott Rakes, MD  polyethylene glycol (MIRALAX / GLYCOLAX) packet Take 17 g by mouth daily. Patient not taking: Reported on 07/20/2018 04/20/18   Charlott Rakes, MD  RENAGEL 800 MG tablet Take 800-1,600 mg by mouth See admin instructions. Take 1600 mg by mouth 3 times daily with meals and take 800 mg by mouth twice daily with snacks 05/23/17   [provider]  sildenafil (VIAGRA) 50 MG tablet Take 1 tablet (50 mg total) by mouth daily as needed for erectile dysfunction. 11/02/18   Charlott Rakes, MD  timolol (TIMOPTIC) 0.5 % ophthalmic solution Place 1 drop into the left eye 2 (two) times daily.    [provider]    Family History Family History  Problem Relation Age of Onset  . Diabetes Mother     Social History Social History   Tobacco Use  . Smoking status: Former Smoker    Packs/day: 0.00    Years: 4.00    Pack years: 0.00    Quit date: 02/23/1983    Years since quitting: 36.1  . Smokeless tobacco: Never Used  Substance Use Topics  . Alcohol use: No  . Drug use:  No     Allergies   Patient has no known allergies.   Review of Systems Review of Systems 10 Systems reviewed and are negative for acute change except as noted in the HPI.   Physical Exam Updated Vital Signs BP (!) 163/77   Pulse 79   Temp  98.1 F (36.7 C) (Oral)   Resp (!) 28   SpO2 94%   Physical Exam Constitutional:      Comments: Patient is alert and appropriate.  Nontoxic.  No respiratory distress at rest.  HENT:     Head: Normocephalic and atraumatic.  Cardiovascular:     Rate and Rhythm: Normal rate and regular rhythm.  Pulmonary:     Comments: No respiratory distress at rest.  Patient does have fine crackles of the lower lung fields. Abdominal:     General: There is no distension.     Palpations: Abdomen is soft.     Tenderness: There is no abdominal tenderness. There is no guarding.  Musculoskeletal: Normal range of motion.     Comments: Calves are nontender.  Patient has just minimal trace edema of the lower legs.  Patient is a left upper arm fistula has positive thrill.  Skin:    General: Skin is warm and dry.  Neurological:     General: No focal deficit present.     Mental Status: He is oriented to person, place, and time.     Coordination: Coordination normal.      ED Treatments / Results  Labs (all labs ordered are listed, but only abnormal results are displayed) Labs Reviewed  BASIC METABOLIC PANEL - Abnormal; Notable for the following components:      Result Value   CO2 18 (*)    Glucose, Bld 254 (*)    BUN 122 (*)    Creatinine, Ser 12.46 (*)    GFR calc non Af Amer 4 (*)    GFR calc Af Amer 4 (*)    Anion gap 18 (*)    All other components within normal limits  CBC - Abnormal; Notable for the following components:   RBC 3.29 (*)    Hemoglobin 10.7 (*)    HCT 31.8 (*)    RDW 15.9 (*)    Platelets 103 (*)    All other components within normal limits  CBG MONITORING, ED - Abnormal; Notable for the following components:    Glucose-Capillary 232 (*)    All other components within normal limits  TROPONIN I (HIGH SENSITIVITY) - Abnormal; Notable for the following components:   Troponin I (High Sensitivity) 68 (*)    All other components within normal limits  SARS CORONAVIRUS 2 (TAT 6-24 HRS)  TROPONIN I (HIGH SENSITIVITY)    EKG EKG Interpretation  Date/Time:  Wednesday March 31 2019 17:49:11 EST Ventricular Rate:  81 PR Interval:    QRS Duration: 92 QT Interval:  412 QTC Calculation: 479 R Axis:   44 Text Interpretation: Sinus rhythm Borderline prolonged PR interval Probable left atrial enlargement Repol abnrm suggests ischemia, lateral leads Minimal ST elevation, anterior leads t waves peaked and inferior/lateral ST depression compared to previous Confirmed by Charlesetta Shanks [phone]) on 03/31/2019 5:54:45 PM   Radiology Dg Chest Port 1 View  Result Date: 03/31/2019 CLINICAL DATA:  Hemoptysis. Productive cough. Blood tinged sputum. EXAM: PORTABLE CHEST 1 VIEW COMPARISON:  Chest radiograph 02/06/2017 FINDINGS: Mild cardiomegaly. Vascular stent in the region of the left subclavian and left axilla. Central perivascular haziness suspicious for pulmonary edema, additional are Kerley B-lines. More confluent opacities in the left perihilar, infrahilar, and right perihilar lung. No large pleural effusion. No pneumothorax. No acute osseous abnormalities are seen. IMPRESSION: 1. Cardiomegaly with pulmonary edema. 2. More confluent bilateral infrahilar and left perihilar opacities may represent confluent edema or superimposed infection. Electronically Signed  By: Keith Rake M.D.   On: 03/31/2019 18:35    Procedures Procedures (including critical care time)  Medications Ordered in ED Medications - No data to display   Initial Impression / Assessment and Plan / ED Course  I have reviewed the triage vital signs and the nursing notes.  Pertinent labs & imaging results that were available during my care  of the patient were reviewed by me and considered in my medical decision making (see chart for details).  Clinical Course as of Apr 01 945  Wed Mar 31, 2019  1948 Consult: Reviewed his nephrology Dr. Augustin Coupe.  Advises that at this time with a potassium of 5.1 does not need emergent dialysis.  We did discuss the vascular congestion on her chest x-ray and the patient's EKG.  Plan will be to admit the patient to medical service and they will get him on schedule for dialysis in the hospital.   [MP]  2207 Consult: Reviewed with Dr. Delfina Redwood of cardiology.  Does not suggest empiric heparinization based on current troponins and EKG findings.  She has reviewed older EKGs and history.  Continue to trend troponins.   [MP]  2225 Consult:Dr. Earlean Polka for admission.   [MP]    Clinical Course User Index [MP] Charlesetta Shanks, MD      Patient presents with chief complaint of hemoptysis.  Etiology unclear.  CT angio negative for pulmonary embolus.  Patient does have vascular congestion.  He has missed 2 dialysis sessions due to complications of clotted access graft.  Patient's troponins have shown mild elevation.  This is reviewed with cardiology.  At this time does not recommend empiric anticoagulation and will admit to hospital service with continued trending of troponins.  Patient's T waves peaked on EKG however potassium only borderline elevated above normal.  Will not institute measures for hyperkalemia at this time.  This is been reviewed with nephrology.  Final Clinical Impressions(s) / ED Diagnoses   Final diagnoses:  None    ED Discharge Orders    None       Charlesetta Shanks, MD 04/02/19 [phone]

## 2019-04-01 ENCOUNTER — Observation Stay (HOSPITAL_BASED_OUTPATIENT_CLINIC_OR_DEPARTMENT_OTHER): Payer: Medicare Other

## 2019-04-01 ENCOUNTER — Observation Stay (HOSPITAL_COMMUNITY): Payer: Medicare Other

## 2019-04-01 ENCOUNTER — Other Ambulatory Visit: Payer: Self-pay

## 2019-04-01 DIAGNOSIS — R9431 Abnormal electrocardiogram [ECG] [EKG]: Secondary | ICD-10-CM | POA: Diagnosis not present

## 2019-04-01 DIAGNOSIS — Z87891 Personal history of nicotine dependence: Secondary | ICD-10-CM | POA: Diagnosis not present

## 2019-04-01 DIAGNOSIS — E8770 Fluid overload, unspecified: Secondary | ICD-10-CM | POA: Diagnosis present

## 2019-04-01 DIAGNOSIS — R931 Abnormal findings on diagnostic imaging of heart and coronary circulation: Secondary | ICD-10-CM

## 2019-04-01 DIAGNOSIS — D696 Thrombocytopenia, unspecified: Secondary | ICD-10-CM | POA: Diagnosis present

## 2019-04-01 DIAGNOSIS — E1122 Type 2 diabetes mellitus with diabetic chronic kidney disease: Secondary | ICD-10-CM | POA: Diagnosis present

## 2019-04-01 DIAGNOSIS — Z833 Family history of diabetes mellitus: Secondary | ICD-10-CM | POA: Diagnosis not present

## 2019-04-01 DIAGNOSIS — D631 Anemia in chronic kidney disease: Secondary | ICD-10-CM | POA: Diagnosis present

## 2019-04-01 DIAGNOSIS — N186 End stage renal disease: Secondary | ICD-10-CM | POA: Diagnosis present

## 2019-04-01 DIAGNOSIS — I248 Other forms of acute ischemic heart disease: Secondary | ICD-10-CM | POA: Diagnosis present

## 2019-04-01 DIAGNOSIS — E1140 Type 2 diabetes mellitus with diabetic neuropathy, unspecified: Secondary | ICD-10-CM | POA: Diagnosis present

## 2019-04-01 DIAGNOSIS — Z794 Long term (current) use of insulin: Secondary | ICD-10-CM | POA: Diagnosis not present

## 2019-04-01 DIAGNOSIS — H5461 Unqualified visual loss, right eye, normal vision left eye: Secondary | ICD-10-CM | POA: Diagnosis present

## 2019-04-01 DIAGNOSIS — I2510 Atherosclerotic heart disease of native coronary artery without angina pectoris: Secondary | ICD-10-CM | POA: Diagnosis present

## 2019-04-01 DIAGNOSIS — R918 Other nonspecific abnormal finding of lung field: Secondary | ICD-10-CM | POA: Diagnosis not present

## 2019-04-01 DIAGNOSIS — N2581 Secondary hyperparathyroidism of renal origin: Secondary | ICD-10-CM | POA: Diagnosis present

## 2019-04-01 DIAGNOSIS — J9601 Acute respiratory failure with hypoxia: Secondary | ICD-10-CM | POA: Diagnosis present

## 2019-04-01 DIAGNOSIS — E785 Hyperlipidemia, unspecified: Secondary | ICD-10-CM | POA: Diagnosis present

## 2019-04-01 DIAGNOSIS — I120 Hypertensive chronic kidney disease with stage 5 chronic kidney disease or end stage renal disease: Secondary | ICD-10-CM | POA: Diagnosis present

## 2019-04-01 DIAGNOSIS — R509 Fever, unspecified: Secondary | ICD-10-CM | POA: Diagnosis not present

## 2019-04-01 DIAGNOSIS — R042 Hemoptysis: Secondary | ICD-10-CM | POA: Diagnosis present

## 2019-04-01 DIAGNOSIS — Z20828 Contact with and (suspected) exposure to other viral communicable diseases: Secondary | ICD-10-CM | POA: Diagnosis present

## 2019-04-01 DIAGNOSIS — Z7982 Long term (current) use of aspirin: Secondary | ICD-10-CM | POA: Diagnosis not present

## 2019-04-01 DIAGNOSIS — Z992 Dependence on renal dialysis: Secondary | ICD-10-CM | POA: Diagnosis not present

## 2019-04-01 DIAGNOSIS — E875 Hyperkalemia: Secondary | ICD-10-CM | POA: Diagnosis present

## 2019-04-01 DIAGNOSIS — R590 Localized enlarged lymph nodes: Secondary | ICD-10-CM | POA: Diagnosis present

## 2019-04-01 DIAGNOSIS — J810 Acute pulmonary edema: Secondary | ICD-10-CM | POA: Diagnosis present

## 2019-04-01 LAB — SARS CORONAVIRUS 2 BY RT PCR (HOSPITAL ORDER, PERFORMED IN ~~LOC~~ HOSPITAL LAB): SARS Coronavirus 2: NEGATIVE

## 2019-04-01 LAB — CBC
HCT: 29.4 % — ABNORMAL LOW (ref 39.0–52.0)
Hemoglobin: 10.3 g/dL — ABNORMAL LOW (ref 13.0–17.0)
MCH: 32.6 pg (ref 26.0–34.0)
MCHC: 35 g/dL (ref 30.0–36.0)
MCV: 93 fL (ref 80.0–100.0)
Platelets: 103 10*3/uL — ABNORMAL LOW (ref 150–400)
RBC: 3.16 MIL/uL — ABNORMAL LOW (ref 4.22–5.81)
RDW: 15.9 % — ABNORMAL HIGH (ref 11.5–15.5)
WBC: 9.4 10*3/uL (ref 4.0–10.5)
nRBC: 0 % (ref 0.0–0.2)

## 2019-04-01 LAB — BASIC METABOLIC PANEL
Anion gap: 18 — ABNORMAL HIGH (ref 5–15)
BUN: 57 mg/dL — ABNORMAL HIGH (ref 6–20)
CO2: 22 mmol/L (ref 22–32)
Calcium: 9.1 mg/dL (ref 8.9–10.3)
Chloride: 97 mmol/L — ABNORMAL LOW (ref 98–111)
Creatinine, Ser: 7.25 mg/dL — ABNORMAL HIGH (ref 0.61–1.24)
GFR calc Af Amer: 9 mL/min — ABNORMAL LOW (ref >60)
GFR calc non Af Amer: 7 mL/min — ABNORMAL LOW (ref >60)
Glucose, Bld: 159 mg/dL — ABNORMAL HIGH (ref 70–99)
Potassium: 4.2 mmol/L (ref 3.5–5.1)
Sodium: 137 mmol/L (ref 135–145)

## 2019-04-01 LAB — TROPONIN I (HIGH SENSITIVITY)
Troponin I (High Sensitivity): 328 ng/L (ref <18)
Troponin I (High Sensitivity): 362 ng/L (ref <18)

## 2019-04-01 LAB — GLUCOSE, CAPILLARY
Glucose-Capillary: 143 mg/dL — ABNORMAL HIGH (ref 70–99)
Glucose-Capillary: 256 mg/dL — ABNORMAL HIGH (ref 70–99)
Glucose-Capillary: 124 mg/dL — ABNORMAL HIGH (ref 70–99)
Glucose-Capillary: 278 mg/dL — ABNORMAL HIGH (ref 70–99)

## 2019-04-01 LAB — SARS CORONAVIRUS 2 (TAT 6-24 HRS): SARS Coronavirus 2: NEGATIVE

## 2019-04-01 LAB — HIV ANTIBODY (ROUTINE TESTING W REFLEX): HIV Screen 4th Generation wRfx: NONREACTIVE

## 2019-04-01 LAB — ECHOCARDIOGRAM COMPLETE
Height: 71 in
Weight: 2524.8 oz

## 2019-04-01 MED ORDER — HEPARIN SODIUM (PORCINE) 5000 UNIT/ML IJ SOLN
5000.0000 [IU] | Freq: Three times a day (TID) | INTRAMUSCULAR | Status: DC
  Administered 2019-04-01 – 2019-04-03 (×7): 5000 [IU] via SUBCUTANEOUS
  Filled 2019-04-01 (×7): qty 1

## 2019-04-01 MED ORDER — ASPIRIN 81 MG PO CHEW
81.0000 mg | CHEWABLE_TABLET | Freq: Every day | ORAL | Status: DC
  Administered 2019-04-01 – 2019-04-03 (×3): 81 mg via ORAL
  Filled 2019-04-01 (×3): qty 1

## 2019-04-01 MED ORDER — SEVELAMER CARBONATE 800 MG PO TABS: 800.0000 mg | ORAL_TABLET | Freq: Two times a day (BID) | ORAL | Status: DC | PRN

## 2019-04-01 MED ORDER — CHLORHEXIDINE GLUCONATE CLOTH 2 % EX PADS
6.0000 | MEDICATED_PAD | Freq: Every day | CUTANEOUS | Status: DC
  Administered 2019-04-02 – 2019-04-03 (×2): 6 via TOPICAL

## 2019-04-01 NOTE — Progress Notes (Signed)
PROGRESS NOTE    Russell Price  [id number] DOB: Dec 24, 1960 DOA: 03/31/2019 PCP: Charlott Rakes, MD  Brief Narrative:Russell Price is a 58 y.o. male with history of ESRD on hemodialysis on Tuesday Thursday Saturday had missed his dialysis last 2 session due to clotted fistula which was declotted 2 days ago.  Patient started becoming short of breath and has hemoptysis yesterday morning.  ED Course: In the ER patient was afebrile EKG was showing normal sinus rhythm with peaked T waves and ST depression in the inferior and lateral leads.  CT angiogram of the chest was showed pulmonary edema.  Labs show potassium of 5 creatinine of 12.4 high-sensitivity troponin was 68 and 106 hemoglobin 10.7 platelets 103 glucose 254 Covid test was negative.  Assessment & Plan: 1. Acute hypoxic respiratory failure/pulmonary edema -Due to missed hemodialysis from clotted AV fistula  -Improving after HD overnight but still remains volume overloaded with hypoxia and bibasilar crackles  -Nephrology following, anticipate need for further HD soon  2.  Hemoptysis -Secondary to pulmonary edema, this is resolving post HD last night, CTA negative for PE  3.  Abnormal EKG, elevated troponin -Suspect this is secondary to demand ischemia from acute pulmonary edema from missed HD as above -Appreciate cardiology input, follow-up 2D echocardiogram  4.  Type 2 diabetes mellitus -Continue Lantus, sliding scale insulin  5.  Hypertension -Continue amlodipine and Coreg  6.  Hilar and mediastinal lymphadenopathy -Likely reactive, follow-up as outpatient for this  DVT prophylaxis:  Add heparin subcutaneous Code Status: Full code. Family Communication:  No family at bedside Disposition Plan:  Home in 1 to 2 days pending improvement in hypoxia and pulmonary edema Consultants:   Cardiology and renal   Procedures:   Antimicrobials:    Subjective: -Was dialyzed early this morning, patient reports  improvement in symptoms but still has hypoxia, O2 sats dropped to mid 70s this morning post HD  Objective: Vitals:   04/01/19 0535 04/01/19 0757 04/01/19 0808 04/01/19 0829  BP: (!) 102/43 (!) 112/39  (!) 122/52  Pulse: 91 86 81   Resp: 17 20    Temp: 98.6 F (37 C) 99.6 F (37.6 C)    TempSrc: Oral Oral    SpO2: 98%  92%   Weight: 71.6 kg     Height: 5\' 11"  (1.803 m)       Intake/Output Summary (Last 24 hours) at 04/01/2019 1045 Last data filed at 04/01/2019 0500 Gross per 24 hour  Intake 0 ml  Output 3000 ml  Net -3000 ml   Filed Weights   04/01/19 0535  Weight: 71.6 kg    Examination:  General exam: Pleasant middle-aged male sitting up in bed, ill-appearing, AAO x3 Respiratory system: Fine bibasilar crackles  cardiovascular system: S1 & S2 heard, RRR.  Gastrointestinal system: Abdomen is nondistended, soft and nontender.Normal bowel sounds heard. Central nervous system: Alert and oriented. No focal neurological deficits. Extremities: No edema Skin: No rashes, lesions or ulcers Psychiatry: Judgement and insight appear normal. Mood & affect appropriate.     Data Reviewed:   CBC: Recent Labs  Lab 03/31/19 1708 04/01/19 0555  WBC 9.5 9.4  HGB 10.7* 10.3*  HCT 31.8* 29.4*  MCV 96.7 93.0  PLT 103* [id number]*   Basic Metabolic Panel: Recent Labs  Lab 03/31/19 1708 03/31/19 2217 04/01/19 0555  NA 135  --  137  K 5.0 5.8* 4.2  CL 99  --  97*  CO2 18*  --  22  GLUCOSE 254*  --  159*  BUN 122*  --  57*  CREATININE 12.46*  --  7.25*  CALCIUM 9.2  --  9.1   GFR: Estimated Creatinine Clearance: 11 mL/min (A) (by C-G formula based on SCr of 7.25 mg/dL (H)). Liver Function Tests: No results for input(s): AST, ALT, ALKPHOS, BILITOT, PROT, ALBUMIN in the last 168 hours. No results for input(s): LIPASE, AMYLASE in the last 168 hours. No results for input(s): AMMONIA in the last 168 hours. Coagulation Profile: Recent Labs  Lab 03/31/19 2217  INR 1.2    Cardiac Enzymes: No results for input(s): CKTOTAL, CKMB, CKMBINDEX, TROPONINI in the last 168 hours. BNP (last 3 results) No results for input(s): PROBNP in the last 8760 hours. HbA1C: No results for input(s): HGBA1C in the last 72 hours. CBG: Recent Labs  Lab 03/31/19 1752 04/01/19 0605  GLUCAP 232* 143*   Lipid Profile: No results for input(s): CHOL, HDL, LDLCALC, TRIG, CHOLHDL, LDLDIRECT in the last 72 hours. Thyroid Function Tests: No results for input(s): TSH, T4TOTAL, FREET4, T3FREE, THYROIDAB in the last 72 hours. Anemia Panel: No results for input(s): VITAMINB12, FOLATE, FERRITIN, TIBC, IRON, RETICCTPCT in the last 72 hours. Urine analysis:    Component Value Date/Time   COLORURINE YELLOW 03/16/2018 1752   APPEARANCEUR HAZY (A) 03/16/2018 1752   LABSPEC 1.012 03/16/2018 1752   PHURINE 6.0 03/16/2018 1752   GLUCOSEU NEGATIVE 03/16/2018 1752   HGBUR SMALL (A) 03/16/2018 1752   BILIRUBINUR NEGATIVE 03/16/2018 1752   BILIRUBINUR negative 02/27/2015 1458   BILIRUBINUR neg 02/22/2013 1737   KETONESUR NEGATIVE 03/16/2018 1752   PROTEINUR 100 (A) 03/16/2018 1752   UROBILINOGEN 0.2 02/28/2015 0032   NITRITE NEGATIVE 03/16/2018 1752   LEUKOCYTESUR TRACE (A) 03/16/2018 1752   Sepsis Labs: @LABRCNTIP (procalcitonin:4,lacticidven:4)  ) Recent Results (from the past 240 hour(s))  SARS CORONAVIRUS 2 (TAT 6-24 HRS) Nasopharyngeal Nasopharyngeal Swab     Status: None   Collection Time: 03/31/19  6:44 PM   Specimen: Nasopharyngeal Swab  Result Value Ref Range Status   SARS Coronavirus 2 NEGATIVE NEGATIVE Final    Comment: (NOTE) SARS-CoV-2 target nucleic acids are NOT DETECTED. The SARS-CoV-2 RNA is generally detectable in upper and lower respiratory specimens during the acute phase of infection. Negative results do not preclude SARS-CoV-2 infection, do not rule out co-infections with other pathogens, and should not be used as the sole basis for treatment or other  patient management decisions. Negative results must be combined with clinical observations, patient history, and epidemiological information. The expected result is Negative. Fact Sheet for Patients: SugarRoll.be Fact Sheet for Healthcare Providers: https://www.woods-mathews.com/ This test is not yet approved or cleared by the [place] FDA and  has been authorized for detection and/or diagnosis of SARS-CoV-2 by FDA under an Emergency Use Authorization (EUA). This EUA will remain  in effect (meaning this test can be used) for the duration of the COVID-19 declaration under Section 56 4(b)(1) of the Act, 21 U.S.C. section 360bbb-3(b)(1), unless the authorization is terminated or revoked sooner. Performed at [facility], [place] [address], [place], [place] [address]   SARS Coronavirus 2 by RT PCR (hospital order, performed in [facility] hospital lab) Nasopharyngeal Nasopharyngeal Swab     Status: None   Collection Time: 03/31/19  8:15 PM   Specimen: Nasopharyngeal Swab  Result Value Ref Range Status   SARS Coronavirus 2 NEGATIVE NEGATIVE Final    Comment: (NOTE) If result is NEGATIVE SARS-CoV-2 target nucleic acids are NOT DETECTED. The SARS-CoV-2 RNA is  generally detectable in upper and lower  respiratory specimens during the acute phase of infection. The lowest  concentration of SARS-CoV-2 viral copies this assay can detect is 250  copies / mL. A negative result does not preclude SARS-CoV-2 infection  and should not be used as the sole basis for treatment or other  patient management decisions.  A negative result may occur with  improper specimen collection / handling, submission of specimen other  than nasopharyngeal swab, presence of viral mutation(s) within the  areas targeted by this assay, and inadequate number of viral copies  (<250 copies / mL). A negative result must be combined with clinical  observations, patient history,  and epidemiological information. If result is POSITIVE SARS-CoV-2 target nucleic acids are DETECTED. The SARS-CoV-2 RNA is generally detectable in upper and lower  respiratory specimens dur ing the acute phase of infection.  Positive  results are indicative of active infection with SARS-CoV-2.  Clinical  correlation with patient history and other diagnostic information is  necessary to determine patient infection status.  Positive results do  not rule out bacterial infection or co-infection with other viruses. If result is PRESUMPTIVE POSTIVE SARS-CoV-2 nucleic acids MAY BE PRESENT.   A presumptive positive result was obtained on the submitted specimen  and confirmed on repeat testing.  While 2019 novel coronavirus  (SARS-CoV-2) nucleic acids may be present in the submitted sample  additional confirmatory testing may be necessary for epidemiological  and / or clinical management purposes  to differentiate between  SARS-CoV-2 and other Sarbecovirus currently known to infect humans.  If clinically indicated additional testing with an alternate test  methodology [id number]) is advised. The SARS-CoV-2 RNA is generally  detectable in upper and lower respiratory sp ecimens during the acute  phase of infection. The expected result is Negative. Fact Sheet for Patients:  StrictlyIdeas.no Fact Sheet for Healthcare Providers: BankingDealers.co.za This test is not yet approved or cleared by the [place] FDA and has been authorized for detection and/or diagnosis of SARS-CoV-2 by FDA under an Emergency Use Authorization (EUA).  This EUA will remain in effect (meaning this test can be used) for the duration of the COVID-19 declaration under Section 564(b)(1) of the Act, 21 U.S.C. section 360bbb-3(b)(1), unless the authorization is terminated or revoked sooner. Performed at [facility], [place] [address], [place], [place] [address]           Radiology Studies: Ct Angio Chest Pe W/cm &/or Wo Cm  Result Date: 03/31/2019 CLINICAL DATA:  Hemoptysis. EXAM: CT ANGIOGRAPHY CHEST WITH CONTRAST TECHNIQUE: Multidetector CT imaging of the chest was performed using the standard protocol during bolus administration of intravenous contrast. Multiplanar CT image reconstructions and MIPs were obtained to evaluate the vascular anatomy. CONTRAST:  33mL OMNIPAQUE IOHEXOL 350 MG/ML SOLN COMPARISON:  Chest x-ray dated 03/31/2019 and chest CT dated 02/14/2015 FINDINGS: Cardiovascular: Satisfactory opacification of the pulmonary arteries to the segmental level. No evidence of pulmonary embolism. Normal heart size. No pericardial effusion. Aortic atherosclerosis. Extensive coronary artery calcifications, progressed since the prior study. Stent in the left innominate and subclavian veins. Mediastinum/Nodes: Patient has progressive mediastinal and bilateral hilar adenopathy as well as multiple small lymph nodes in the axillae, increased in size since the prior study. The largest lymph node is approximately 10 mm in diameter in the precarinal region. Lungs/Pleura: There are hazy bilateral pulmonary infiltrates consistent with pulmonary edema. Small bilateral pleural effusions, right larger than left. Upper Abdomen: No acute abnormalities. Musculoskeletal: No chest wall abnormality.  No acute or significant osseous findings. Review of the MIP images confirms the above findings. IMPRESSION: 1. No pulmonary emboli. 2. Bilateral pulmonary edema with small pleural effusions. 3. Chronic progressive slight mediastinal and hilar adenopathy, nonspecific. Electronically Signed   By: Lorriane Shire M.D.   On: 03/31/2019 20:53   Dg Chest Port 1 View  Result Date: 04/01/2019 CLINICAL DATA:  Hypoxia. EXAM: PORTABLE CHEST 1 VIEW COMPARISON:  March 31, 2019. FINDINGS: Stable cardiomegaly. No pneumothorax or pleural effusion is noted. Significantly improved right lower lobe  opacity is noted. Left lower lobe opacity is noted concerning for edema or inflammation. Bony thorax is unremarkable. IMPRESSION: Significantly improved right lower lobe opacity is noted. Grossly stable left lower lobe opacity is noted concerning for edema or inflammation. Electronically Signed   By: Marijo Conception M.D.   On: 04/01/2019 08:53   Dg Chest Port 1 View  Result Date: 03/31/2019 CLINICAL DATA:  Hemoptysis. Productive cough. Blood tinged sputum. EXAM: PORTABLE CHEST 1 VIEW COMPARISON:  Chest radiograph 02/06/2017 FINDINGS: Mild cardiomegaly. Vascular stent in the region of the left subclavian and left axilla. Central perivascular haziness suspicious for pulmonary edema, additional are Kerley B-lines. More confluent opacities in the left perihilar, infrahilar, and right perihilar lung. No large pleural effusion. No pneumothorax. No acute osseous abnormalities are seen. IMPRESSION: 1. Cardiomegaly with pulmonary edema. 2. More confluent bilateral infrahilar and left perihilar opacities may represent confluent edema or superimposed infection. Electronically Signed   By: Keith Rake M.D.   On: 03/31/2019 18:35        Scheduled Meds:  amLODipine  10 mg Oral Daily   atorvastatin  40 mg Oral q1800   carvedilol  6.25 mg Oral BID WC   Chlorhexidine Gluconate Cloth  6 each Topical Q0600   furosemide  80 mg Oral BID   gabapentin  300 mg Oral Daily   insulin aspart  0-9 Units Subcutaneous TID WC   insulin glargine  10 Units Subcutaneous BID   sevelamer carbonate  1,600 mg Oral TID WC   timolol  1 drop Left Eye BID   Continuous Infusions:   LOS: 0 days    Time spent: 8min    Domenic Polite, MD [facility] Page via [email or web address], password [email or web address] After 7PM please contact night-coverage  04/01/2019, 10:45 AM

## 2019-04-01 NOTE — Progress Notes (Signed)
[facility] Progress Note  Subjective: 3 L off overnight, hypoxic post HD this am per RN, still on nsala O2. CXR better today  Vitals:   04/01/19 0757 04/01/19 0808 04/01/19 0829 04/01/19 1131  BP: (!) 112/39  (!) 122/52 (!) 95/32  Pulse: 86 81  76  Resp: 20   16  Temp: 99.6 F (37.6 C)   100 F (37.8 C)  TempSrc: Oral   Oral  SpO2:  92%  100%  Weight:      Height:        Inpatient medications: . amLODipine  10 mg Oral Daily  . aspirin  81 mg Oral Daily  . atorvastatin  40 mg Oral q1800  . carvedilol  6.25 mg Oral BID WC  . Chlorhexidine Gluconate Cloth  6 each Topical Q0600  . furosemide  80 mg Oral BID  . gabapentin  300 mg Oral Daily  . heparin injection (subcutaneous)  5,000 Units Subcutaneous Q8H  . insulin aspart  0-9 Units Subcutaneous TID WC  . insulin glargine  10 Units Subcutaneous BID  . sevelamer carbonate  1,600 mg Oral TID WC  . timolol  1 drop Left Eye BID    acetaminophen **OR** acetaminophen, bisacodyl, ondansetron **OR** ondansetron (ZOFRAN) IV, sevelamer carbonate    Exam:  alert, nasal O2, very warm to touch , coughing intermittently  +JVD   Chest crackles bilat bases, o/w clear   Cor reg no mrg   Abd soft ntnd   Ext no edema   NF, ox 3   TTS NW  4h 43min  400/800   71kg  2/2.25 bath  LUA AVG  Hep 5000+ 1528midrun Hectorol4 mcg IV qHD  Mircera 60 q week last 10/29  Assessment/Plan: 1. SOB/ pulm edema - due to vol overload from missed HD x 2. CXR better and down to dry but still hypoxemic off of O2.  Will plan HD in am tomorrow, lower dry wt and try to get him off O2.   2. ESRD - HD TTS. Clotted AVG so missed Sat and Tues, declotted Tues but didn't go to HD Wed as told. Had HD here late last night, CXR better but still hypoxic. Plan for HD tomorrow am off schedule.  3. Hypertension/volume - cont meds 4. Anemia of CKD - Hgb 10.7. Follow trends. 5. Secondary Hyperparathyroidism - Ca at goal. Will check phos and adjust  binders. Continue VDRA, binders. Hectorol and renvela 6. Nutrition - Renal diet w/fluid restrictions.  7. DMT2   Rob Kaeden Mester 04/01/2019, 12:23 PM  Iron/TIBC/Ferritin/ %Sat    Component Value Date/Time   IRON 81 11/07/2016 1056   TIBC 186 (L) 11/07/2016 1056   FERRITIN 1,461 (H) 11/07/2016 1056   IRONPCTSAT 44 (H) 11/07/2016 1056   Recent Labs  Lab 03/31/19 2217 04/01/19 0555  NA  --  137  K 5.8* 4.2  CL  --  97*  CO2  --  22  GLUCOSE  --  159*  BUN  --  57*  CREATININE  --  7.25*  CALCIUM  --  9.1  INR 1.2  --    No results for input(s): AST, ALT, ALKPHOS, BILITOT, PROT in the last 168 hours. Recent Labs  Lab 04/01/19 0555  WBC 9.4  HGB 10.3*  HCT 29.4*  PLT 103*

## 2019-04-01 NOTE — Progress Notes (Addendum)
Pt fever reached 101.1. MD notified, PRN tylenol administered. Will continue to monitor.  In addition, latest troponin level 362. Cardiology notified, believed to be due to volume overload and dialysis. Will also continue to monitor.  Did not give coreg because patient's systolic blood pressure fell outside of parameters at 39mmhg. Will continue to monitor.

## 2019-04-01 NOTE — Progress Notes (Signed)
Progress Note  Patient Name: Russell Price Date of Encounter: 04/01/2019  Primary Cardiologist: No primary care provider on file.   Subjective   Patient admitted overnight after presenting in acute hypoxic respiratory failure and hemoptysis after 2 missed dialysis sessions. Patient underwent dialysis overnight with improvement in symptoms. He states his breathing is now close to his baseline. No more hemoptysis. No chest pain.   Inpatient Medications    Scheduled Meds:  amLODipine  10 mg Oral Daily   aspirin  81 mg Oral Daily   atorvastatin  40 mg Oral q1800   carvedilol  6.25 mg Oral BID WC   Chlorhexidine Gluconate Cloth  6 each Topical Q0600   furosemide  80 mg Oral BID   gabapentin  300 mg Oral Daily   heparin injection (subcutaneous)  5,000 Units Subcutaneous Q8H   insulin aspart  0-9 Units Subcutaneous TID WC   insulin glargine  10 Units Subcutaneous BID   sevelamer carbonate  1,600 mg Oral TID WC   timolol  1 drop Left Eye BID   Continuous Infusions:  PRN Meds: acetaminophen **OR** acetaminophen, bisacodyl, ondansetron **OR** ondansetron (ZOFRAN) IV, sevelamer carbonate   Vital Signs    Vitals:   04/01/19 0757 04/01/19 0808 04/01/19 0829 04/01/19 1131  BP: (!) 112/39  (!) 122/52 (!) 95/32  Pulse: 86 81  76  Resp: 20   16  Temp: 99.6 F (37.6 C)   100 F (37.8 C)  TempSrc: Oral   Oral  SpO2:  92%  100%  Weight:      Height:        Intake/Output Summary (Last 24 hours) at 04/01/2019 1217 Last data filed at 04/01/2019 0500 Gross per 24 hour  Intake 0 ml  Output 3000 ml  Net -3000 ml   Last 3 Weights 04/01/2019 11/02/2018 07/20/2018  Weight (lbs) 157 lb 12.8 oz 159 lb 149 lb 3.2 oz  Weight (kg) 71.578 kg 72.122 kg 67.677 kg      Telemetry    Normal sinus rhythm with rates in the 70's to 80's. - Personally Reviewed  ECG    Normal sinus rhythm with mild ST depression in inferior leads, down sloping ST segment in V6, and mild  T wave inversion in lead aVL. Peaked T waves noted.  - Personally Reviewed  Physical Exam   GEN: Resting comfortably in bed in no acute distress.   Neck: Supple. No significant JVD. Cardiac: RRR. Murmur noted at upper sternal border. No rubs or gallops.  Respiratory: No increased work of breathing. Clear to auscultation bilaterally. GI: Soft, nontender, non-distended. Bowel sounds present. MS: No lower extremity edema. No deformity. Skin: Warm and dry. Neuro:  Nonfocal  Psych: Normal affect   Labs    High Sensitivity Troponin:   Recent Labs  Lab 03/31/19 1717 03/31/19 2015 04/01/19 0555 04/01/19 1038  TROPONINIHS 68* 106* 328* 362*      Chemistry Recent Labs  Lab 03/31/19 1708 03/31/19 2217 04/01/19 0555  NA 135  --  137  K 5.0 5.8* 4.2  CL 99  --  97*  CO2 18*  --  22  GLUCOSE 254*  --  159*  BUN 122*  --  57*  CREATININE 12.46*  --  7.25*  CALCIUM 9.2  --  9.1  GFRNONAA 4*  --  7*  GFRAA 4*  --  9*  ANIONGAP 18*  --  18*     Hematology Recent Labs  Lab 03/31/19 1708 04/01/19  0555  WBC 9.5 9.4  RBC 3.29* 3.16*  HGB 10.7* 10.3*  HCT 31.8* 29.4*  MCV 96.7 93.0  MCH 32.5 32.6  MCHC 33.6 35.0  RDW 15.9* 15.9*  PLT 103* 103*    BNPNo results for input(s): BNP, PROBNP in the last 168 hours.   DDimer No results for input(s): DDIMER in the last 168 hours.   Radiology    Ct Angio Chest Pe W/cm &/or Wo Cm  Result Date: 03/31/2019 CLINICAL DATA:  Hemoptysis. EXAM: CT ANGIOGRAPHY CHEST WITH CONTRAST TECHNIQUE: Multidetector CT imaging of the chest was performed using the standard protocol during bolus administration of intravenous contrast. Multiplanar CT image reconstructions and MIPs were obtained to evaluate the vascular anatomy. CONTRAST:  38mL OMNIPAQUE IOHEXOL 350 MG/ML SOLN COMPARISON:  Chest x-ray dated 03/31/2019 and chest CT dated 02/14/2015 FINDINGS: Cardiovascular: Satisfactory opacification of the pulmonary arteries to the segmental level. No  evidence of pulmonary embolism. Normal heart size. No pericardial effusion. Aortic atherosclerosis. Extensive coronary artery calcifications, progressed since the prior study. Stent in the left innominate and subclavian veins. Mediastinum/Nodes: Patient has progressive mediastinal and bilateral hilar adenopathy as well as multiple small lymph nodes in the axillae, increased in size since the prior study. The largest lymph node is approximately 10 mm in diameter in the precarinal region. Lungs/Pleura: There are hazy bilateral pulmonary infiltrates consistent with pulmonary edema. Small bilateral pleural effusions, right larger than left. Upper Abdomen: No acute abnormalities. Musculoskeletal: No chest wall abnormality. No acute or significant osseous findings. Review of the MIP images confirms the above findings. IMPRESSION: 1. No pulmonary emboli. 2. Bilateral pulmonary edema with small pleural effusions. 3. Chronic progressive slight mediastinal and hilar adenopathy, nonspecific. Electronically Signed   By: Lorriane Shire M.D.   On: 03/31/2019 20:53   Dg Chest Port 1 View  Result Date: 04/01/2019 CLINICAL DATA:  Hypoxia. EXAM: PORTABLE CHEST 1 VIEW COMPARISON:  March 31, 2019. FINDINGS: Stable cardiomegaly. No pneumothorax or pleural effusion is noted. Significantly improved right lower lobe opacity is noted. Left lower lobe opacity is noted concerning for edema or inflammation. Bony thorax is unremarkable. IMPRESSION: Significantly improved right lower lobe opacity is noted. Grossly stable left lower lobe opacity is noted concerning for edema or inflammation. Electronically Signed   By: Marijo Conception M.D.   On: 04/01/2019 08:53   Dg Chest Port 1 View  Result Date: 03/31/2019 CLINICAL DATA:  Hemoptysis. Productive cough. Blood tinged sputum. EXAM: PORTABLE CHEST 1 VIEW COMPARISON:  Chest radiograph 02/06/2017 FINDINGS: Mild cardiomegaly. Vascular stent in the region of the left subclavian and left  axilla. Central perivascular haziness suspicious for pulmonary edema, additional are Kerley B-lines. More confluent opacities in the left perihilar, infrahilar, and right perihilar lung. No large pleural effusion. No pneumothorax. No acute osseous abnormalities are seen. IMPRESSION: 1. Cardiomegaly with pulmonary edema. 2. More confluent bilateral infrahilar and left perihilar opacities may represent confluent edema or superimposed infection. Electronically Signed   By: Keith Rake M.D.   On: 03/31/2019 18:35    Cardiac Studies   Echocardiogram 03/14/2018: Study Conclusions: - Left ventricle: The cavity size was normal. Wall thickness was   normal. Systolic function was normal. The estimated ejection   fraction was in the range of 60% to 65%. Wall motion was normal;   there were no regional wall motion abnormalities. Features are   consistent with a pseudonormal left ventricular filling pattern,   with concomitant abnormal relaxation and increased filling   pressure (grade 2  diastolic dysfunction). - Pulmonary arteries: PA peak pressure: 31 mm Hg (S).  Patient Profile   Mr. Dimaria is a 58 y.o. male with a history of severe 2 vessel CAD involving RCA and OM1 on cardiac catheterization in 02/2016 but both vessel small in caliber and no good PCI targets so treated medically, hypertension, diabetes mellitus on insulin, and ESRD on hemodialysis who is being seen today for evaluation of elevated troponin after presenting to the ED with acute hypoxic respiratory failure after missed dialysis sessions.  Assessment & Plan    Elevated Troponin with Known CAD - Patient presented with acute hypoxic respiratory failure after 2 missed dialysis sessions.  - High-sensitivity troponin elevated at 68 >> 106 >> 328 >> 362. Patient has known severe CAD in RCA and OM1 on cath in 2017. However, they were small caliber vessels with no good PCI targets so medical therapy was recommened. - Echo shows LVEF of  50-55% with grade 1 diastolic dysfunction and basal and mid inferolateral wall motion abnormalities.  - Patient may have progression of known CAD so discussed possible left heart catheterization. However, patient prefers to treat medically at this time. Continue aspirin, statin, and beta-blocker.  Acute Hypoxic Respiratory Failure  - Secondary to acute pulmonary edema due to missed dialysis sessions. - Improved following dialysis overnight. - Volume status and Lasix per Nephrology. - Patient feels warm on exam and temp 100. Continue to monitor. WBC normal. COVID negative. - Management per Nephrology and primary team.  Hypertension - BP soft but stable today after dialysis. Most recent BP 95/32. - Home medications include: Amlodipine 10mg  daily and Coreg 6.25mg  twice daily. - If BP remains low, recommend reducing/discontiuing Amlodipine.  Hyperlipidemia - LDL 31 in 01/2019. - Continue home Lipitor 40mg  daily.  Diabetes Mellitus - Management per primary team.  Otherwise, per primary team.   For questions or updates, please contact [place] Please consult [email or web address] for contact info under        Signed, Darreld Mclean, PA-C  04/01/2019, 12:17 PM

## 2019-04-01 NOTE — Progress Notes (Signed)
Echocardiogram 2D Echocardiogram has been performed.  Oneal Deputy Kirtan Sada 04/01/2019, 10:23 AM

## 2019-04-01 NOTE — ED Notes (Signed)
Dialysis made aware of bed placement. Dialysis will call report to the floor after treatment.

## 2019-04-02 LAB — BASIC METABOLIC PANEL
Anion gap: 15 (ref 5–15)
BUN: 84 mg/dL — ABNORMAL HIGH (ref 6–20)
CO2: 22 mmol/L (ref 22–32)
Calcium: 8.5 mg/dL — ABNORMAL LOW (ref 8.9–10.3)
Chloride: 95 mmol/L — ABNORMAL LOW (ref 98–111)
Creatinine, Ser: 10.65 mg/dL — ABNORMAL HIGH (ref 0.61–1.24)
GFR calc Af Amer: 5 mL/min — ABNORMAL LOW (ref >60)
GFR calc non Af Amer: 5 mL/min — ABNORMAL LOW (ref >60)
Glucose, Bld: 281 mg/dL — ABNORMAL HIGH (ref 70–99)
Potassium: 4.7 mmol/L (ref 3.5–5.1)
Sodium: 132 mmol/L — ABNORMAL LOW (ref 135–145)

## 2019-04-02 LAB — CBC
HCT: 25.1 % — ABNORMAL LOW (ref 39.0–52.0)
Hemoglobin: 8.4 g/dL — ABNORMAL LOW (ref 13.0–17.0)
MCH: 32.6 pg (ref 26.0–34.0)
MCHC: 33.5 g/dL (ref 30.0–36.0)
MCV: 97.3 fL (ref 80.0–100.0)
Platelets: 81 10*3/uL — ABNORMAL LOW (ref 150–400)
RBC: 2.58 MIL/uL — ABNORMAL LOW (ref 4.22–5.81)
RDW: 15.7 % — ABNORMAL HIGH (ref 11.5–15.5)
WBC: 5.4 10*3/uL (ref 4.0–10.5)
nRBC: 0 % (ref 0.0–0.2)

## 2019-04-02 LAB — GLUCOSE, CAPILLARY
Glucose-Capillary: 260 mg/dL — ABNORMAL HIGH (ref 70–99)
Glucose-Capillary: 86 mg/dL (ref 70–99)
Glucose-Capillary: 221 mg/dL — ABNORMAL HIGH (ref 70–99)
Glucose-Capillary: 169 mg/dL — ABNORMAL HIGH (ref 70–99)

## 2019-04-02 LAB — MRSA PCR SCREENING: MRSA by PCR: NEGATIVE

## 2019-04-02 MED ORDER — DOXERCALCIFEROL 4 MCG/2ML IV SOLN
4.0000 ug | INTRAVENOUS | Status: DC
  Administered 2019-04-03: 11:00:00 4 ug via INTRAVENOUS
  Filled 2019-04-02: qty 2

## 2019-04-02 MED ORDER — RENA-VITE PO TABS
1.0000 | ORAL_TABLET | Freq: Every day | ORAL | Status: DC
  Administered 2019-04-02: 1 via ORAL
  Filled 2019-04-02: qty 1

## 2019-04-02 MED ORDER — DARBEPOETIN ALFA 60 MCG/0.3ML IJ SOSY
60.0000 ug | PREFILLED_SYRINGE | INTRAMUSCULAR | Status: DC
  Administered 2019-04-03: 11:00:00 60 ug via INTRAVENOUS
  Filled 2019-04-02: qty 0.3

## 2019-04-02 MED ORDER — AMLODIPINE BESYLATE 5 MG PO TABS: 5.0000 mg | ORAL_TABLET | Freq: Every day | ORAL | Status: DC

## 2019-04-02 NOTE — Progress Notes (Signed)
PROGRESS NOTE    Russell Price  [id number] DOB: 1960/10/22 DOA: 03/31/2019 PCP: Charlott Rakes, MD  Brief Narrative:Russell Price is a 58 y.o. male with history of ESRD on hemodialysis on Tuesday Thursday Saturday had missed his dialysis last 2 session due to clotted fistula which was declotted 2 days ago.  Patient started becoming short of breath and has hemoptysis yesterday morning.  ED Course: In the ER patient was afebrile EKG was showing normal sinus rhythm with peaked T waves and ST depression in the inferior and lateral leads.  CT angiogram of the chest was showed pulmonary edema.  Labs show potassium of 5 creatinine of 12.4 high-sensitivity troponin was 68 and 106 hemoglobin 10.7 platelets 103 glucose 254 Covid test was negative.  Assessment & Plan:  1. Acute hypoxic respiratory failure/pulmonary edema -Due to missed hemodialysis from clotted AV fistula  -Improving after HD -Wean off O2 -Nephrology following,   2.  Fever -pt spiked a temp of 101 last pm -Follow-up blood cultures, antibiotics on hold  3.  Abnormal EKG, elevated troponin -Suspect this is secondary to demand ischemia from acute pulmonary edema from missed HD as above -Appreciate cardiology input, 2D echo with preserved EF, no wall motion abnormalities  4.  Type 2 diabetes mellitus -Continue Lantus, sliding scale insulin  5.  Hypertension -Continue amlodipine and Coreg  6.  Hilar and mediastinal lymphadenopathy -Likely reactive, follow-up as outpatient for this  DVT prophylaxis:  heparin subcutaneous Code Status: Full code. Family Communication:  Wife at bedside Disposition Plan:  Home tomorrow if stable   consultants:   Cardiology and renal   Procedures:   Antimicrobials:    Subjective: -The temp of 101 yesterday, mild cough but nonproductive this is overall improving with the dialysis, dyspnea is improving as well no nausea vomiting no abdominal pain no  rashes  Objective: Vitals:   04/02/19 0930 04/02/19 1000 04/02/19 1030 04/02/19 1204  BP: (!) 107/24 97/72 (!) 111/47 (!) 95/35  Pulse: (!) 57 (!) 57 (!) 59 60  Resp:    19  Temp:    98.8 F (37.1 C)  TempSrc:    Oral  SpO2:    98%  Weight:      Height:        Intake/Output Summary (Last 24 hours) at 04/02/2019 1253 Last data filed at 04/02/2019 0600 Gross per 24 hour  Intake 360 ml  Output 200 ml  Net 160 ml   Filed Weights   04/01/19 0535 04/02/19 0425 04/02/19 0715  Weight: 71.6 kg 72.4 kg 72.6 kg    Examination:  Gen: Latin male sitting up in bed, no distress HEENT: PERRLA, Neck supple, no JVD Lungs: Few rales in the left base otherwise clear CVS: RRR,No Gallops,Rubs or new Murmurs Abd: soft, Non tender, non distended, BS present Extremities: No edema Skin: no new rashes Psychiatry: Judgement and insight appear normal. Mood & affect appropriate.     Data Reviewed:   CBC: Recent Labs  Lab 03/31/19 1708 04/01/19 0555 04/02/19 0518  WBC 9.5 9.4 5.4  HGB 10.7* 10.3* 8.4*  HCT 31.8* 29.4* 25.1*  MCV 96.7 93.0 97.3  PLT 103* 103* 81*   Basic Metabolic Panel: Recent Labs  Lab 03/31/19 1708 03/31/19 2217 04/01/19 0555 04/02/19 0518  NA 135  --  137 132*  K 5.0 5.8* 4.2 4.7  CL 99  --  97* 95*  CO2 18*  --  22 22  GLUCOSE 254*  --  159* 281*  BUN  122*  --  57* 84*  CREATININE 12.46*  --  7.25* 10.65*  CALCIUM 9.2  --  9.1 8.5*   GFR: Estimated Creatinine Clearance: 7.6 mL/min (A) (by C-G formula based on SCr of 10.65 mg/dL (H)). Liver Function Tests: No results for input(s): AST, ALT, ALKPHOS, BILITOT, PROT, ALBUMIN in the last 168 hours. No results for input(s): LIPASE, AMYLASE in the last 168 hours. No results for input(s): AMMONIA in the last 168 hours. Coagulation Profile: Recent Labs  Lab 03/31/19 2217  INR 1.2   Cardiac Enzymes: No results for input(s): CKTOTAL, CKMB, CKMBINDEX, TROPONINI in the last 168 hours. BNP (last 3  results) No results for input(s): PROBNP in the last 8760 hours. HbA1C: No results for input(s): HGBA1C in the last 72 hours. CBG: Recent Labs  Lab 04/01/19 1129 04/01/19 1618 04/01/19 2115 04/02/19 0558 04/02/19 1214  GLUCAP 256* 124* 278* 260* 86   Lipid Profile: No results for input(s): CHOL, HDL, LDLCALC, TRIG, CHOLHDL, LDLDIRECT in the last 72 hours. Thyroid Function Tests: No results for input(s): TSH, T4TOTAL, FREET4, T3FREE, THYROIDAB in the last 72 hours. Anemia Panel: No results for input(s): VITAMINB12, FOLATE, FERRITIN, TIBC, IRON, RETICCTPCT in the last 72 hours. Urine analysis:    Component Value Date/Time   COLORURINE YELLOW 03/16/2018 1752   APPEARANCEUR HAZY (A) 03/16/2018 1752   LABSPEC 1.012 03/16/2018 1752   PHURINE 6.0 03/16/2018 1752   GLUCOSEU NEGATIVE 03/16/2018 1752   HGBUR SMALL (A) 03/16/2018 1752   BILIRUBINUR NEGATIVE 03/16/2018 1752   BILIRUBINUR negative 02/27/2015 1458   BILIRUBINUR neg 02/22/2013 1737   KETONESUR NEGATIVE 03/16/2018 1752   PROTEINUR 100 (A) 03/16/2018 1752   UROBILINOGEN 0.2 02/28/2015 0032   NITRITE NEGATIVE 03/16/2018 1752   LEUKOCYTESUR TRACE (A) 03/16/2018 1752   Sepsis Labs: @LABRCNTIP (procalcitonin:4,lacticidven:4)  ) Recent Results (from the past 240 hour(s))  SARS CORONAVIRUS 2 (TAT 6-24 HRS) Nasopharyngeal Nasopharyngeal Swab     Status: None   Collection Time: 03/31/19  6:44 PM   Specimen: Nasopharyngeal Swab  Result Value Ref Range Status   SARS Coronavirus 2 NEGATIVE NEGATIVE Final    Comment: (NOTE) SARS-CoV-2 target nucleic acids are NOT DETECTED. The SARS-CoV-2 RNA is generally detectable in upper and lower respiratory specimens during the acute phase of infection. Negative results do not preclude SARS-CoV-2 infection, do not rule out co-infections with other pathogens, and should not be used as the sole basis for treatment or other patient management decisions. Negative results must be combined  with clinical observations, patient history, and epidemiological information. The expected result is Negative. Fact Sheet for Patients: SugarRoll.be Fact Sheet for Healthcare Providers: https://www.woods-mathews.com/ This test is not yet approved or cleared by the [place] FDA and  has been authorized for detection and/or diagnosis of SARS-CoV-2 by FDA under an Emergency Use Authorization (EUA). This EUA will remain  in effect (meaning this test can be used) for the duration of the COVID-19 declaration under Section 56 4(b)(1) of the Act, 21 U.S.C. section 360bbb-3(b)(1), unless the authorization is terminated or revoked sooner. Performed at [facility], [place] [address], [place], [place] [address]   SARS Coronavirus 2 by RT PCR (hospital order, performed in [facility] hospital lab) Nasopharyngeal Nasopharyngeal Swab     Status: None   Collection Time: 03/31/19  8:15 PM   Specimen: Nasopharyngeal Swab  Result Value Ref Range Status   SARS Coronavirus 2 NEGATIVE NEGATIVE Final    Comment: (NOTE) If result is NEGATIVE SARS-CoV-2 target  nucleic acids are NOT DETECTED. The SARS-CoV-2 RNA is generally detectable in upper and lower  respiratory specimens during the acute phase of infection. The lowest  concentration of SARS-CoV-2 viral copies this assay can detect is 250  copies / mL. A negative result does not preclude SARS-CoV-2 infection  and should not be used as the sole basis for treatment or other  patient management decisions.  A negative result may occur with  improper specimen collection / handling, submission of specimen other  than nasopharyngeal swab, presence of viral mutation(s) within the  areas targeted by this assay, and inadequate number of viral copies  (<250 copies / mL). A negative result must be combined with clinical  observations, patient history, and epidemiological information. If result is  POSITIVE SARS-CoV-2 target nucleic acids are DETECTED. The SARS-CoV-2 RNA is generally detectable in upper and lower  respiratory specimens dur ing the acute phase of infection.  Positive  results are indicative of active infection with SARS-CoV-2.  Clinical  correlation with patient history and other diagnostic information is  necessary to determine patient infection status.  Positive results do  not rule out bacterial infection or co-infection with other viruses. If result is PRESUMPTIVE POSTIVE SARS-CoV-2 nucleic acids MAY BE PRESENT.   A presumptive positive result was obtained on the submitted specimen  and confirmed on repeat testing.  While 2019 novel coronavirus  (SARS-CoV-2) nucleic acids may be present in the submitted sample  additional confirmatory testing may be necessary for epidemiological  and / or clinical management purposes  to differentiate between  SARS-CoV-2 and other Sarbecovirus currently known to infect humans.  If clinically indicated additional testing with an alternate test  methodology [phone]) is advised. The SARS-CoV-2 RNA is generally  detectable in upper and lower respiratory sp ecimens during the acute  phase of infection. The expected result is Negative. Fact Sheet for Patients:  StrictlyIdeas.no Fact Sheet for Healthcare Providers: BankingDealers.co.za This test is not yet approved or cleared by the [place] FDA and has been authorized for detection and/or diagnosis of SARS-CoV-2 by FDA under an Emergency Use Authorization (EUA).  This EUA will remain in effect (meaning this test can be used) for the duration of the COVID-19 declaration under Section 564(b)(1) of the Act, 21 U.S.C. section 360bbb-3(b)(1), unless the authorization is terminated or revoked sooner. Performed at [facility], [place] [address], [place], [place] [address]   Culture, blood (routine x 2)     Status: None  (Preliminary result)   Collection Time: 04/01/19  6:02 PM   Specimen: BLOOD RIGHT ARM  Result Value Ref Range Status   Specimen Description BLOOD RIGHT ARM  Final   Special Requests   Final    BOTTLES DRAWN AEROBIC AND ANAEROBIC Blood Culture adequate volume   Culture   Final    NO GROWTH < 24 HOURS Performed at [facility], [place] [address] [place], [place] [address]    Report Status PENDING  Incomplete  Culture, blood (routine x 2)     Status: None (Preliminary result)   Collection Time: 04/01/19  6:06 PM   Specimen: BLOOD RIGHT HAND  Result Value Ref Range Status   Specimen Description BLOOD RIGHT HAND  Final   Special Requests   Final    BOTTLES DRAWN AEROBIC AND ANAEROBIC Blood Culture adequate volume   Culture   Final    NO GROWTH < 24 HOURS Performed at [facility], [place] [address] [place], [place]  [id number]    Report Status PENDING  Incomplete  MRSA PCR Screening     Status: None   Collection Time: 04/01/19  7:15 PM   Specimen: Nasopharyngeal  Result Value Ref Range Status   MRSA by PCR NEGATIVE NEGATIVE Final    Comment:        The GeneXpert MRSA Assay (FDA approved for NASAL specimens only), is one component of a comprehensive MRSA colonization surveillance program. It is not intended to diagnose MRSA infection nor to guide or monitor treatment for MRSA infections. Performed at [facility], [place] [address], [place], [place] [address]          Radiology Studies: Ct Angio Chest Pe W/cm &/or Wo Cm  Result Date: 03/31/2019 CLINICAL DATA:  Hemoptysis. EXAM: CT ANGIOGRAPHY CHEST WITH CONTRAST TECHNIQUE: Multidetector CT imaging of the chest was performed using the standard protocol during bolus administration of intravenous contrast. Multiplanar CT image reconstructions and MIPs were obtained to evaluate the vascular anatomy. CONTRAST:  98mL OMNIPAQUE IOHEXOL 350 MG/ML SOLN COMPARISON:  Chest x-ray dated 03/31/2019 and chest CT dated  02/14/2015 FINDINGS: Cardiovascular: Satisfactory opacification of the pulmonary arteries to the segmental level. No evidence of pulmonary embolism. Normal heart size. No pericardial effusion. Aortic atherosclerosis. Extensive coronary artery calcifications, progressed since the prior study. Stent in the left innominate and subclavian veins. Mediastinum/Nodes: Patient has progressive mediastinal and bilateral hilar adenopathy as well as multiple small lymph nodes in the axillae, increased in size since the prior study. The largest lymph node is approximately 10 mm in diameter in the precarinal region. Lungs/Pleura: There are hazy bilateral pulmonary infiltrates consistent with pulmonary edema. Small bilateral pleural effusions, right larger than left. Upper Abdomen: No acute abnormalities. Musculoskeletal: No chest wall abnormality. No acute or significant osseous findings. Review of the MIP images confirms the above findings. IMPRESSION: 1. No pulmonary emboli. 2. Bilateral pulmonary edema with small pleural effusions. 3. Chronic progressive slight mediastinal and hilar adenopathy, nonspecific. Electronically Signed   By: Lorriane Shire M.D.   On: 03/31/2019 20:53   Dg Chest Port 1 View  Result Date: 04/01/2019 CLINICAL DATA:  Hypoxia. EXAM: PORTABLE CHEST 1 VIEW COMPARISON:  March 31, 2019. FINDINGS: Stable cardiomegaly. No pneumothorax or pleural effusion is noted. Significantly improved right lower lobe opacity is noted. Left lower lobe opacity is noted concerning for edema or inflammation. Bony thorax is unremarkable. IMPRESSION: Significantly improved right lower lobe opacity is noted. Grossly stable left lower lobe opacity is noted concerning for edema or inflammation. Electronically Signed   By: Marijo Conception M.D.   On: 04/01/2019 08:53   Dg Chest Port 1 View  Result Date: 03/31/2019 CLINICAL DATA:  Hemoptysis. Productive cough. Blood tinged sputum. EXAM: PORTABLE CHEST 1 VIEW COMPARISON:   Chest radiograph 02/06/2017 FINDINGS: Mild cardiomegaly. Vascular stent in the region of the left subclavian and left axilla. Central perivascular haziness suspicious for pulmonary edema, additional are Kerley B-lines. More confluent opacities in the left perihilar, infrahilar, and right perihilar lung. No large pleural effusion. No pneumothorax. No acute osseous abnormalities are seen. IMPRESSION: 1. Cardiomegaly with pulmonary edema. 2. More confluent bilateral infrahilar and left perihilar opacities may represent confluent edema or superimposed infection. Electronically Signed   By: Keith Rake M.D.   On: 03/31/2019 18:35        Scheduled Meds:  aspirin  81 mg Oral Daily   atorvastatin  40 mg Oral q1800   Chlorhexidine Gluconate Cloth  6 each Topical Q0600  Chlorhexidine Gluconate Cloth  6 each Topical Q0600   [START ON 04/03/2019] darbepoetin (ARANESP) injection - DIALYSIS  60 mcg Intravenous Q Sat-HD   [START ON 04/03/2019] doxercalciferol  4 mcg Intravenous Q T,Th,Sa-HD   gabapentin  300 mg Oral Daily   heparin injection (subcutaneous)  5,000 Units Subcutaneous Q8H   insulin aspart  0-9 Units Subcutaneous TID WC   insulin glargine  10 Units Subcutaneous BID   multivitamin  1 tablet Oral QHS   sevelamer carbonate  1,600 mg Oral TID WC   timolol  1 drop Left Eye BID   Continuous Infusions:   LOS: 1 day    Time spent: 83min    Domenic Polite, MD [facility]  04/02/2019, 12:53 PM

## 2019-04-02 NOTE — Progress Notes (Addendum)
Progress Note  Patient Name: Russell Price Date of Encounter: 04/02/2019  Primary Cardiologist: Sanda Klein, MD   Subjective   No acute overnight events. Patient had temperature of 101.1 yesterday afternoon but has been afebrile since then. He states he feels better today than yesterday. No chest pain and breathing has improved. No palpitations. He still has a productive cough. When asked about hemoptysis, he states "not really." Wife wants to know when he can go home.  Inpatient Medications    Scheduled Meds:  aspirin  81 mg Oral Daily   atorvastatin  40 mg Oral q1800   Chlorhexidine Gluconate Cloth  6 each Topical Q0600   Chlorhexidine Gluconate Cloth  6 each Topical Q0600   [START ON 04/03/2019] darbepoetin (ARANESP) injection - DIALYSIS  60 mcg Intravenous Q Sat-HD   [START ON 04/03/2019] doxercalciferol  4 mcg Intravenous Q T,Th,Sa-HD   gabapentin  300 mg Oral Daily   heparin injection (subcutaneous)  5,000 Units Subcutaneous Q8H   insulin aspart  0-9 Units Subcutaneous TID WC   insulin glargine  10 Units Subcutaneous BID   multivitamin  1 tablet Oral QHS   sevelamer carbonate  1,600 mg Oral TID WC   timolol  1 drop Left Eye BID   Continuous Infusions:   PRN Meds: acetaminophen **OR** acetaminophen, bisacodyl, ondansetron **OR** ondansetron (ZOFRAN) IV, sevelamer carbonate   Vital Signs    Vitals:   04/02/19 0930 04/02/19 1000 04/02/19 1030 04/02/19 1204  BP: (!) 107/24 97/72 (!) 111/47 (!) 95/35  Pulse: (!) 57 (!) 57 (!) 59 60  Resp:    19  Temp:    98.8 F (37.1 C)  TempSrc:    Oral  SpO2:    98%  Weight:      Height:        Intake/Output Summary (Last 24 hours) at 04/02/2019 1238 Last data filed at 04/02/2019 0600 Gross per 24 hour  Intake 360 ml  Output 200 ml  Net 160 ml   Last 3 Weights 04/02/2019 04/02/2019 04/01/2019  Weight (lbs) 160 lb 0.9 oz 159 lb 9.6 oz 157 lb 12.8 oz  Weight (kg) 72.6 kg 72.394 kg 71.578 kg       Telemetry    Normal sinus rhythm with rates in the 80's to 120's. Possible QTc prolongation but difficult to tell. - Personally Reviewed  ECG    No new ECG tracing today. - Personally Reviewed  Physical Exam   GEN: Resting comfortably in bed in no acute distress.   Neck: Supple. No significant JVD. Cardiac: RRR. Soft systolic murmur noted at upper sternal border. No rubs or gallops.  Respiratory: No increased work of breathing. Clear to auscultation bilaterally. No significant wheezes, rhonchi, or rales appreciated. GI: Soft, nontender, non-distended. Bowel sounds present. MS: No lower extremity edema. No deformity. Skin: Warm and dry. Neuro:  Nonfocal  Psych: Normal affect   Labs    High Sensitivity Troponin:   Recent Labs  Lab 03/31/19 1717 03/31/19 2015 04/01/19 0555 04/01/19 1038  TROPONINIHS 68* 106* 328* 362*      Chemistry Recent Labs  Lab 03/31/19 1708 03/31/19 2217 04/01/19 0555 04/02/19 0518  NA 135  --  137 132*  K 5.0 5.8* 4.2 4.7  CL 99  --  97* 95*  CO2 18*  --  22 22  GLUCOSE 254*  --  159* 281*  BUN 122*  --  57* 84*  CREATININE 12.46*  --  7.25* 10.65*  CALCIUM 9.2  --  9.1 8.5*  GFRNONAA 4*  --  7* 5*  GFRAA 4*  --  9* 5*  ANIONGAP 18*  --  18* 15     Hematology Recent Labs  Lab 03/31/19 1708 04/01/19 0555 04/02/19 0518  WBC 9.5 9.4 5.4  RBC 3.29* 3.16* 2.58*  HGB 10.7* 10.3* 8.4*  HCT 31.8* 29.4* 25.1*  MCV 96.7 93.0 97.3  MCH 32.5 32.6 32.6  MCHC 33.6 35.0 33.5  RDW 15.9* 15.9* 15.7*  PLT 103* 103* 81*    BNPNo results for input(s): BNP, PROBNP in the last 168 hours.   DDimer No results for input(s): DDIMER in the last 168 hours.   Radiology    Ct Angio Chest Pe W/cm &/or Wo Cm  Result Date: 03/31/2019 CLINICAL DATA:  Hemoptysis. EXAM: CT ANGIOGRAPHY CHEST WITH CONTRAST TECHNIQUE: Multidetector CT imaging of the chest was performed using the standard protocol during bolus administration of intravenous contrast.  Multiplanar CT image reconstructions and MIPs were obtained to evaluate the vascular anatomy. CONTRAST:  26mL OMNIPAQUE IOHEXOL 350 MG/ML SOLN COMPARISON:  Chest x-ray dated 03/31/2019 and chest CT dated 02/14/2015 FINDINGS: Cardiovascular: Satisfactory opacification of the pulmonary arteries to the segmental level. No evidence of pulmonary embolism. Normal heart size. No pericardial effusion. Aortic atherosclerosis. Extensive coronary artery calcifications, progressed since the prior study. Stent in the left innominate and subclavian veins. Mediastinum/Nodes: Patient has progressive mediastinal and bilateral hilar adenopathy as well as multiple small lymph nodes in the axillae, increased in size since the prior study. The largest lymph node is approximately 10 mm in diameter in the precarinal region. Lungs/Pleura: There are hazy bilateral pulmonary infiltrates consistent with pulmonary edema. Small bilateral pleural effusions, right larger than left. Upper Abdomen: No acute abnormalities. Musculoskeletal: No chest wall abnormality. No acute or significant osseous findings. Review of the MIP images confirms the above findings. IMPRESSION: 1. No pulmonary emboli. 2. Bilateral pulmonary edema with small pleural effusions. 3. Chronic progressive slight mediastinal and hilar adenopathy, nonspecific. Electronically Signed   By: Lorriane Shire M.D.   On: 03/31/2019 20:53   Dg Chest Port 1 View  Result Date: 04/01/2019 CLINICAL DATA:  Hypoxia. EXAM: PORTABLE CHEST 1 VIEW COMPARISON:  March 31, 2019. FINDINGS: Stable cardiomegaly. No pneumothorax or pleural effusion is noted. Significantly improved right lower lobe opacity is noted. Left lower lobe opacity is noted concerning for edema or inflammation. Bony thorax is unremarkable. IMPRESSION: Significantly improved right lower lobe opacity is noted. Grossly stable left lower lobe opacity is noted concerning for edema or inflammation. Electronically Signed   By:  Marijo Conception M.D.   On: 04/01/2019 08:53   Dg Chest Port 1 View  Result Date: 03/31/2019 CLINICAL DATA:  Hemoptysis. Productive cough. Blood tinged sputum. EXAM: PORTABLE CHEST 1 VIEW COMPARISON:  Chest radiograph 02/06/2017 FINDINGS: Mild cardiomegaly. Vascular stent in the region of the left subclavian and left axilla. Central perivascular haziness suspicious for pulmonary edema, additional are Kerley B-lines. More confluent opacities in the left perihilar, infrahilar, and right perihilar lung. No large pleural effusion. No pneumothorax. No acute osseous abnormalities are seen. IMPRESSION: 1. Cardiomegaly with pulmonary edema. 2. More confluent bilateral infrahilar and left perihilar opacities may represent confluent edema or superimposed infection. Electronically Signed   By: Keith Rake M.D.   On: 03/31/2019 18:35    Cardiac Studies   Echocardiogram 03/14/2018: Study Conclusions: - Left ventricle: The cavity size was normal. Wall thickness was   normal. Systolic function was normal. The estimated ejection  fraction was in the range of 60% to 65%. Wall motion was normal;   there were no regional wall motion abnormalities. Features are   consistent with a pseudonormal left ventricular filling pattern,   with concomitant abnormal relaxation and increased filling   pressure (grade 2 diastolic dysfunction). - Pulmonary arteries: PA peak pressure: 31 mm Hg (S).  Patient Profile   Mr. Cuellar is a 58 y.o. male with a history of severe 2 vessel CAD involving RCA and OM1 on cardiac catheterization in 02/2016 but both vessel small in caliber and no good PCI targets so treated medically, hypertension, diabetes mellitus on insulin, and ESRD on hemodialysis who is being seen today for evaluation of elevated troponin after presenting to the ED with acute hypoxic respiratory failure after missed dialysis sessions.  Assessment & Plan    Elevated Troponin with Known CAD - Patient presented  with acute hypoxic respiratory failure after 2 missed dialysis sessions.  - High-sensitivity troponin elevated at 68 >> 106 >> 328 >> 362. Patient has known severe CAD in RCA and OM1 on cath in 2017. However, they were small caliber vessels with no good PCI targets so medical therapy was recommened. - Echo shows LVEF of 50-55% with grade 1 diastolic dysfunction and basal and mid inferolateral wall motion abnormalities.  - Likely component of demand ischemia due to acute pulmonary edema. However, he may have progression of known CAD so we discussed possible left heart catheterization yesterday. However, patient prefers to treat medically at this time. Continue aspirin, statin, and beta-blocker (if BP allows, currently on hold). Given recent hemoptysis, would not add Plavix.  Acute Hypoxic Respiratory Failure  - Secondary to acute pulmonary edema due to missed dialysis sessions. - Improved following dialysis overnight. - Volume status managed through dialysis. - Patient had fever of 101.1 yesterday. Afebrile since then. WBC normal. COVID negative. Blood cultures pending. - Management per Nephrology and primary team.  Possible Prolonged QTc - Possible prolonged QTc on telemetry but somewhat difficult to tell. - Will order repeat EKG to assess and to see if prior EKG changes have improved with dialysis.  Hypertension - BP has been soft at times with systolic BP as low as 85 this morning. Most recent BP 95/35. - Home medications include: Amlodipine 10mg  daily and Coreg 6.25mg  twice daily. Both were discontinued this morning. Would restart Coreg first if BP allows.  Hyperlipidemia - LDL 31 in 01/2019. - Continue home Lipitor 40mg  daily.  Diabetes Mellitus - Management per primary team.  ESRD - On dialysis. - Management per Nephrology.  Otherwise, per primary team. - Hemoptysis - Hilar and mediastinal lymphadenopathy  - Fever  For questions or updates, please contact [facility]  Please consult [email or web address] for contact info under        Signed, Darreld Mclean, PA-C  04/02/2019, 12:38 PM

## 2019-04-02 NOTE — Progress Notes (Signed)
Responded to spiritual care consult. Pt was on dialysis and awake. Russell Price was open to talk. He mentioned that he was ok that a Chaplain can support him and there was not need to connect with an Russell Price. I was present to offer Russell Price spiritual support. He stated that his wife comes to visit him daily and was looking forward to seeing her soon after the dialysis. He requested a Chaplain follow up if possible over the weekend or Monday if he is still here. I noticed he was teary eyed and I think he is processing some things that maybe a little personal. My guess is that he might have been hesitant to talk further because we were in hemodialysis floor and there were many others around. I offered spiritual care with words of comfort, empathic listening and empathic listening.   Chaplain Resident  Fidel Levy [phone]

## 2019-04-02 NOTE — Progress Notes (Addendum)
Subjective:  ON hd , no cos, tolerating uf   Objective Vital signs in last 24 hours: Vitals:   04/02/19 0723 04/02/19 0724 04/02/19 0730 04/02/19 0800  BP: (!) 122/26 (!) 110/27 (!) 117/34 (!) 114/37  Pulse: 60 61 (!) 59 (!) 59  Resp:      Temp:      TempSrc:      SpO2:      Weight:      Height:       Weight change: 0.816 kg  Physical Exam: General: alert NAD on hd  Heart: RRR, no rub,or gallop Lungs: CTA  Abdomen: bs +, soft, NT, ND Extremities: no pedal edema Dialysis Access: LUA AVGG patent  On hd    OP HD=  TTS NW  4h 37min  400/800   71kg  2/2.25 bath  LUA AVG  Hep 5000+ 1593midrun Hectorol49mcg IV qHD Mircera 60 q week last 10/29  Problem/Plan: 1.  SOB/ pulm edema - due to vol overload from missed HD x 2 Secondary to  CLOTTED AVGG . CXR better and down to dry YESTERDAY was still hypoxemic off of O2.  TODAY  HD , lower dry wt and try to get him off O2.  Will DC po lasix in ESRD  With minimal  UOP/ 100% O2 sat with   O2  2. ESRD - HD TTS. Clotted AVG so missed Sat and Tues, [place] but didn't go to HD.   Plan for HD tomorrow am to keep on  schedule.  3. FEVER- 101.2 spike yesterday 1601 , afeb this am , wbc  Ok this am. BC x2 yest pend . No Antibiotics  4. Hypertension/volume - Bps' lower will decr Amlodipine 10mg  to 5mg  , DC Lasix in ESRD pt MIn UOP . 5. Anemia of CKD - Hgb10.7. Follow trends.give esa tomorrow  6. Secondary Hyperparathyroidism - Ca at goal.Will check phos and adjust binders. Continue VDRA, binders. Hectorol and renvela 7. Nutrition - Renal diet w/fluid restrictions.  8. DMT2  Russell Haber, PA-C [facility] Beeper [phone] 04/02/2019,8:45 AM  LOS: 1 day   Pt seen, examined and agree w A/P as above. Attempting to lower dry wt today w/ extra HD session and get O2 off.  Kelly Splinter  MD 04/02/2019, 11:13 AM    Labs: Basic Metabolic Panel: Recent Labs  Lab 03/31/19 1708 03/31/19 2217  04/01/19 0555 04/02/19 0518  NA 135  --  137 132*  K 5.0 5.8* 4.2 4.7  CL 99  --  97* 95*  CO2 18*  --  22 22  GLUCOSE 254*  --  159* 281*  BUN 122*  --  57* 84*  CREATININE 12.46*  --  7.25* 10.65*  CALCIUM 9.2  --  9.1 8.5*   Liver Function Tests: No results for input(s): AST, ALT, ALKPHOS, BILITOT, PROT, ALBUMIN in the last 168 hours. No results for input(s): LIPASE, AMYLASE in the last 168 hours. No results for input(s): AMMONIA in the last 168 hours. CBC: Recent Labs  Lab 03/31/19 1708 04/01/19 0555 04/02/19 0518  WBC 9.5 9.4 5.4  HGB 10.7* 10.3* 8.4*  HCT 31.8* 29.4* 25.1*  MCV 96.7 93.0 97.3  PLT 103* 103* 81*   Cardiac Enzymes: No results for input(s): CKTOTAL, CKMB, CKMBINDEX, TROPONINI in the last 168 hours. CBG: Recent Labs  Lab 04/01/19 0605 04/01/19 1129 04/01/19 1618 04/01/19 2115 04/02/19 0558  GLUCAP 143* 256* 124* 278* 260*     Medications:  .  amLODipine  10 mg Oral Daily  . aspirin  81 mg Oral Daily  . atorvastatin  40 mg Oral q1800  . carvedilol  6.25 mg Oral BID WC  . Chlorhexidine Gluconate Cloth  6 each Topical Q0600  . Chlorhexidine Gluconate Cloth  6 each Topical Q0600  . furosemide  80 mg Oral BID  . gabapentin  300 mg Oral Daily  . heparin injection (subcutaneous)  5,000 Units Subcutaneous Q8H  . insulin aspart  0-9 Units Subcutaneous TID WC  . insulin glargine  10 Units Subcutaneous BID  . sevelamer carbonate  1,600 mg Oral TID WC  . timolol  1 drop Left Eye BID

## 2019-04-03 DIAGNOSIS — R509 Fever, unspecified: Secondary | ICD-10-CM | POA: Diagnosis not present

## 2019-04-03 DIAGNOSIS — J810 Acute pulmonary edema: Secondary | ICD-10-CM | POA: Diagnosis not present

## 2019-04-03 DIAGNOSIS — Z992 Dependence on renal dialysis: Secondary | ICD-10-CM | POA: Diagnosis not present

## 2019-04-03 DIAGNOSIS — N186 End stage renal disease: Secondary | ICD-10-CM | POA: Diagnosis not present

## 2019-04-03 LAB — CBC
HCT: 28 % — ABNORMAL LOW (ref 39.0–52.0)
Hemoglobin: 9.7 g/dL — ABNORMAL LOW (ref 13.0–17.0)
MCH: 32.4 pg (ref 26.0–34.0)
MCHC: 34.6 g/dL (ref 30.0–36.0)
MCV: 93.6 fL (ref 80.0–100.0)
Platelets: 102 10*3/uL — ABNORMAL LOW (ref 150–400)
RBC: 2.99 MIL/uL — ABNORMAL LOW (ref 4.22–5.81)
RDW: 15.1 % (ref 11.5–15.5)
WBC: 5.6 10*3/uL (ref 4.0–10.5)
nRBC: 0 % (ref 0.0–0.2)

## 2019-04-03 LAB — BASIC METABOLIC PANEL
Anion gap: 15 (ref 5–15)
BUN: 38 mg/dL — ABNORMAL HIGH (ref 6–20)
CO2: 26 mmol/L (ref 22–32)
Calcium: 8.9 mg/dL (ref 8.9–10.3)
Chloride: 94 mmol/L — ABNORMAL LOW (ref 98–111)
Creatinine, Ser: 6.92 mg/dL — ABNORMAL HIGH (ref 0.61–1.24)
GFR calc Af Amer: 9 mL/min — ABNORMAL LOW (ref >60)
GFR calc non Af Amer: 8 mL/min — ABNORMAL LOW (ref >60)
Glucose, Bld: 128 mg/dL — ABNORMAL HIGH (ref 70–99)
Potassium: 3.9 mmol/L (ref 3.5–5.1)
Sodium: 135 mmol/L (ref 135–145)

## 2019-04-03 LAB — GLUCOSE, CAPILLARY
Glucose-Capillary: 187 mg/dL — ABNORMAL HIGH (ref 70–99)
Glucose-Capillary: 118 mg/dL — ABNORMAL HIGH (ref 70–99)

## 2019-04-03 MED ORDER — DARBEPOETIN ALFA 60 MCG/0.3ML IJ SOSY
PREFILLED_SYRINGE | INTRAMUSCULAR | Status: AC
  Administered 2019-04-03: 60 ug via INTRAVENOUS
  Filled 2019-04-03: qty 0.3

## 2019-04-03 MED ORDER — HEPARIN SODIUM (PORCINE) 1000 UNIT/ML IJ SOLN
INTRAMUSCULAR | Status: AC
  Filled 2019-04-03: qty 5

## 2019-04-03 MED ORDER — DOXERCALCIFEROL 4 MCG/2ML IV SOLN
INTRAVENOUS | Status: AC
  Administered 2019-04-03: 4 ug via INTRAVENOUS
  Filled 2019-04-03: qty 2

## 2019-04-03 MED ORDER — HEPARIN SODIUM (PORCINE) 1000 UNIT/ML IJ SOLN
5000.0000 [IU] | Freq: Once | INTRAMUSCULAR | Status: AC
  Administered 2019-04-03: 5000 [IU] via INTRAVENOUS

## 2019-04-03 NOTE — Progress Notes (Addendum)
Subjective:  On hd tolerating ,no cos,  Note probable dc after hd   Objective Vital signs in last 24 hours: Vitals:   04/03/19 0900 04/03/19 0930 04/03/19 1000 04/03/19 1015  BP: (!) 109/28 120/80 (!) 110/33 (!) 98/33  Pulse: 64 71 63 60  Resp: 14 16  16   Temp:      TempSrc:      SpO2:      Weight:      Height:       Weight change: 0.206 kg  Physical Exam: General: alert NAD on hd  Heart: RRR, no rub,or gallop Lungs: CTA  Abdomen: bs +, soft, NT, ND Extremities: no pedal edema Dialysis Access: LUA AVGG patent  On hd    OP HD=  TTSNW 4h 20min 400/800 71kg 2/2.25 bath LUA AVG Hep 5000+ 1575midrun Hectorol82mcg IV qHD Mircera 60 q week last 10/29  Problem/Plan: 1.  SOB/ pulm edema - due to vol overload from missed HD x 2 Secondary to  CLOTTED AVGG . CXR better and below edw YESTERDAY , nl schedule hd  today  /Fu wt /bp for lower edw /.have  DC po lasix in ESRD  With minimal  UOP/ also now off amlodipine with bp lower  at lower edw . For dc today after hd  2. ESRD- HD TTS. Clotted AVG so missed Sat and Tues, [place] but didn't go to HD. HD today  keep on  schedule. 3. FEVER- 101.2 spike 11/12 @1601  isolated , afeb this am , wbc  Ok this am. BC x2  pend . No Antibiotics  4. Hypertension/volume -Bps' lower will now off  Amlodipine  , DC Lasix in ESRD pt MIn UOP . Fu trend as op at [place] lower edw  controlling bp  5. Anemia of CKD - Hgb9.7. Follow trends.give Aranesp  Today  6. Secondary Hyperparathyroidism - Ca at goal. Continue VDRA, binders. Hectorol and renvela 7. Nutrition - Renal diet w/fluid restrictions.  8. DMT2 - per admit   Ernest Haber, PA-C [facility] Beeper [phone] 04/03/2019,10:20 AM  LOS: 2 days   Pt seen, examined and agree w assess/plan as above with additions as indicated.  Looks like 2-3kg under his prior dry wt, will change edw on dc.    [facility] 04/03/2019,  2:32 PM  Labs: Basic Metabolic Panel: Recent Labs  Lab 04/01/19 0555 04/02/19 0518 04/03/19 0549  NA 137 132* 135  K 4.2 4.7 3.9  CL 97* 95* 94*  CO2 22 22 26   GLUCOSE 159* 281* 128*  BUN 57* 84* 38*  CREATININE 7.25* 10.65* 6.92*  CALCIUM 9.1 8.5* 8.9   Liver Function Tests: No results for input(s): AST, ALT, ALKPHOS, BILITOT, PROT, ALBUMIN in the last 168 hours. No results for input(s): LIPASE, AMYLASE in the last 168 hours. No results for input(s): AMMONIA in the last 168 hours. CBC: Recent Labs  Lab 03/31/19 1708 04/01/19 0555 04/02/19 0518 04/03/19 0549  WBC 9.5 9.4 5.4 5.6  HGB 10.7* 10.3* 8.4* 9.7*  HCT 31.8* 29.4* 25.1* 28.0*  MCV 96.7 93.0 97.3 93.6  PLT 103* 103* 81* 102*   Cardiac Enzymes: No results for input(s): CKTOTAL, CKMB, CKMBINDEX, TROPONINI in the last 168 hours. CBG: Recent Labs  Lab 04/02/19 0558 04/02/19 1214 04/02/19 1700 04/02/19 2106 04/03/19 0636  GLUCAP 260* 86 221* 169* 187*    Studies/Results: No results found. Medications:  . aspirin  81 mg Oral Daily  .  atorvastatin  40 mg Oral q1800  . Chlorhexidine Gluconate Cloth  6 each Topical Q0600  . Chlorhexidine Gluconate Cloth  6 each Topical Q0600  . darbepoetin (ARANESP) injection - DIALYSIS  60 mcg Intravenous Q Sat-HD  . doxercalciferol  4 mcg Intravenous Q T,Th,Sa-HD  . gabapentin  300 mg Oral Daily  . heparin injection (subcutaneous)  5,000 Units Subcutaneous Q8H  . insulin aspart  0-9 Units Subcutaneous TID WC  . insulin glargine  10 Units Subcutaneous BID  . multivitamin  1 tablet Oral QHS  . sevelamer carbonate  1,600 mg Oral TID WC  . timolol  1 drop Left Eye BID

## 2019-04-03 NOTE — Progress Notes (Signed)
Discharge education and medication education given with teach back to patient and spouse, all questions and concerns answered. Education on renal diet and increasing activity slowly given to patient with educational handouts. Peripheral IV and telemetry leads removed. All patient belongings given to patient. Patient transported to main entrance by nursing student via wheelchair.

## 2019-04-05 ENCOUNTER — Telehealth: Payer: Self-pay | Admitting: Nephrology

## 2019-04-05 NOTE — Telephone Encounter (Signed)
Transition of Care Contact from [place]   Date of Discharge: 04/03/2019 Date of Contact: 04/05/2019 Method of contact: phone Talked to patient   Patient contacted to discuss transition of care form recent hospitaliztion. Patient was admitted to [facility] from 11/11 to 04/03/2019 with the discharge diagnosis of pulmonary edema d/t missed HD 2/2 clotted AVG.   Medication changes were reviewed.  Patient will follow up with is outpatient dialysis center 04/06/2019.  Other follow up needs include: none   Jen Mow, [facility] Pager: [phone]

## 2019-04-06 ENCOUNTER — Other Ambulatory Visit: Payer: Self-pay | Admitting: *Deleted

## 2019-04-06 DIAGNOSIS — N2581 Secondary hyperparathyroidism of renal origin: Secondary | ICD-10-CM | POA: Diagnosis not present

## 2019-04-06 DIAGNOSIS — Z992 Dependence on renal dialysis: Secondary | ICD-10-CM | POA: Diagnosis not present

## 2019-04-06 DIAGNOSIS — E1129 Type 2 diabetes mellitus with other diabetic kidney complication: Secondary | ICD-10-CM | POA: Diagnosis not present

## 2019-04-06 DIAGNOSIS — N186 End stage renal disease: Secondary | ICD-10-CM | POA: Diagnosis not present

## 2019-04-06 DIAGNOSIS — D631 Anemia in chronic kidney disease: Secondary | ICD-10-CM | POA: Diagnosis not present

## 2019-04-06 LAB — CULTURE, BLOOD (ROUTINE X 2)
Culture: NO GROWTH
Culture: NO GROWTH
Special Requests: ADEQUATE
Special Requests: ADEQUATE

## 2019-04-06 NOTE — Patient Outreach (Signed)
[facility]) Care Management  04/06/2019  Kelten Sheikhidris Taketa 10-01-1960 [id number]    EMMI-GENERAL DISCHARGE RED ON EMMI ALERT Day #1 Date: 04/05/2019 Red Alert Reason: QUESTIONS/PROBLEMS  OUTREACH #1 RN spoke with pt today and inquired further on the above RED EMMI. Pt reports he has all his medications and appointments in place. Pt denies any other inquires, request, problems or questions at this time. No needs presented.  PLAN: Based upon no needs presented at this time. This case will be closed.  Raina Mina, RN Care Management Coordinator [place] Office [phone]

## 2019-04-08 DIAGNOSIS — E1129 Type 2 diabetes mellitus with other diabetic kidney complication: Secondary | ICD-10-CM | POA: Diagnosis not present

## 2019-04-08 DIAGNOSIS — N2581 Secondary hyperparathyroidism of renal origin: Secondary | ICD-10-CM | POA: Diagnosis not present

## 2019-04-08 DIAGNOSIS — Z992 Dependence on renal dialysis: Secondary | ICD-10-CM | POA: Diagnosis not present

## 2019-04-08 DIAGNOSIS — D631 Anemia in chronic kidney disease: Secondary | ICD-10-CM | POA: Diagnosis not present

## 2019-04-08 DIAGNOSIS — N186 End stage renal disease: Secondary | ICD-10-CM | POA: Diagnosis not present

## 2019-04-09 ENCOUNTER — Other Ambulatory Visit: Payer: Self-pay | Admitting: *Deleted

## 2019-04-09 NOTE — Patient Outreach (Signed)
[facility]) Care Management  04/09/2019  Russell Price 24-Jul-1960 [id number]    EMMI-GENERAL DISCHARGE RED ON EMMI ALERT Day #4 Date:04/08/2019 Red Alert Reason:LOST INTEREST IN THINGS   OUTREACH 1 RN spoke with pt today and introduced THN and purpose for today's call. RN inquired further on the RED EMMI as pt denies any "lost of interest in things". RN verified medications, understanding once again of his discharge instructions and verified pt has followed up with the dialysis center as instructed (confirmed). RN confirmed pt's provider (nehropologist) has contacted pt noted in [place]. RN inquired on any other needs and if pt has contacted his primary concerning his recent discharge. Pt states he has an appointment on 12/14 but always has a problems with delays when calling the office. RN offered to communicate with his primary provider and requested the office to call him. Pt receptive and express his gratitude for [facility] assistance.   PLAN: Will reach out to pt's provider and make the request to follow up with this pt. No additional needs at this time as this EMMI has bee resolved and will be closed.   Raina Mina, RN Care Management Coordinator [place] Office [phone]

## 2019-04-10 DIAGNOSIS — N186 End stage renal disease: Secondary | ICD-10-CM | POA: Diagnosis not present

## 2019-04-10 DIAGNOSIS — N2581 Secondary hyperparathyroidism of renal origin: Secondary | ICD-10-CM | POA: Diagnosis not present

## 2019-04-10 DIAGNOSIS — Z992 Dependence on renal dialysis: Secondary | ICD-10-CM | POA: Diagnosis not present

## 2019-04-10 DIAGNOSIS — D631 Anemia in chronic kidney disease: Secondary | ICD-10-CM | POA: Diagnosis not present

## 2019-04-10 DIAGNOSIS — E1129 Type 2 diabetes mellitus with other diabetic kidney complication: Secondary | ICD-10-CM | POA: Diagnosis not present

## 2019-04-12 DIAGNOSIS — E1129 Type 2 diabetes mellitus with other diabetic kidney complication: Secondary | ICD-10-CM | POA: Diagnosis not present

## 2019-04-12 DIAGNOSIS — N186 End stage renal disease: Secondary | ICD-10-CM | POA: Diagnosis not present

## 2019-04-12 DIAGNOSIS — N2581 Secondary hyperparathyroidism of renal origin: Secondary | ICD-10-CM | POA: Diagnosis not present

## 2019-04-12 DIAGNOSIS — Z992 Dependence on renal dialysis: Secondary | ICD-10-CM | POA: Diagnosis not present

## 2019-04-12 DIAGNOSIS — D631 Anemia in chronic kidney disease: Secondary | ICD-10-CM | POA: Diagnosis not present

## 2019-04-14 DIAGNOSIS — E1129 Type 2 diabetes mellitus with other diabetic kidney complication: Secondary | ICD-10-CM | POA: Diagnosis not present

## 2019-04-14 DIAGNOSIS — N186 End stage renal disease: Secondary | ICD-10-CM | POA: Diagnosis not present

## 2019-04-14 DIAGNOSIS — N2581 Secondary hyperparathyroidism of renal origin: Secondary | ICD-10-CM | POA: Diagnosis not present

## 2019-04-14 DIAGNOSIS — Z992 Dependence on renal dialysis: Secondary | ICD-10-CM | POA: Diagnosis not present

## 2019-04-14 DIAGNOSIS — D631 Anemia in chronic kidney disease: Secondary | ICD-10-CM | POA: Diagnosis not present

## 2019-04-17 DIAGNOSIS — N2581 Secondary hyperparathyroidism of renal origin: Secondary | ICD-10-CM | POA: Diagnosis not present

## 2019-04-17 DIAGNOSIS — Z992 Dependence on renal dialysis: Secondary | ICD-10-CM | POA: Diagnosis not present

## 2019-04-17 DIAGNOSIS — N186 End stage renal disease: Secondary | ICD-10-CM | POA: Diagnosis not present

## 2019-04-17 DIAGNOSIS — D631 Anemia in chronic kidney disease: Secondary | ICD-10-CM | POA: Diagnosis not present

## 2019-04-17 DIAGNOSIS — E1129 Type 2 diabetes mellitus with other diabetic kidney complication: Secondary | ICD-10-CM | POA: Diagnosis not present

## 2019-04-19 MED FILL — LANTUS 100 UNITS/ML VIAL: 100 | 28 days supply | Qty: 10 | Fill #1

## 2019-04-26 MED FILL — CARVEDILOL 6.25 MG TABLET: 6.25 | 30 days supply | Qty: 60 | Fill #3

## 2019-04-26 MED FILL — GABAPENTIN 300 MG CAPSULE: 300 | 90 days supply | Qty: 90 | Fill #0

## 2019-04-28 ENCOUNTER — Other Ambulatory Visit: Payer: Self-pay

## 2019-04-28 ENCOUNTER — Encounter: Payer: Medicare Other | Admitting: Vascular Surgery

## 2019-04-28 ENCOUNTER — Encounter: Payer: Self-pay | Admitting: Vascular Surgery

## 2019-04-28 ENCOUNTER — Ambulatory Visit (INDEPENDENT_AMBULATORY_CARE_PROVIDER_SITE_OTHER): Payer: Medicare Other | Admitting: Vascular Surgery

## 2019-04-28 VITALS — BP 164/65 | HR 95 | Temp 97.8°F | Resp 18 | Ht 71.0 in | Wt 156.0 lb

## 2019-04-28 DIAGNOSIS — N186 End stage renal disease: Secondary | ICD-10-CM

## 2019-04-28 DIAGNOSIS — Z992 Dependence on renal dialysis: Secondary | ICD-10-CM

## 2019-04-28 MED FILL — LANTUS 100 UNITS/ML VIAL: 100 | 28 days supply | Qty: 10 | Fill #1

## 2019-04-28 NOTE — Progress Notes (Signed)
Referring Physician: Dr. Lorrene Reid  Patient name: Russell Price MRN: [id number] DOB: Mar 25, 1961 Sex: male  REASON FOR CONSULT: Abrasions ulceration left arm AV graft  HPI: Russell Price is a 58 y.o. male, who has developed multiple ulcerations over his left arm AV graft over the last couple of weeks.  He states that some of this was caused from tape.  He he has never really had a bleeding episode although sometimes he has some prolonged bleeding after dialysis.  His dialysis today is Tuesday Thursday Saturday.  He does not take any anticoagulation other than aspirin.  Other medical problems include coronary artery disease, diabetes both of which are stable.  Past Medical History:  Diagnosis Date  . Back pain 03/12/2018  . CAD (coronary artery disease)    a. underwent cath in 02/2016 after an abnormal nuc which showed 2V CAD in RCA and OM3 with no good targets for PCI. Medical managment recommended.   . Chronic kidney disease   . Diabetes mellitus without complication ([place])    type II - followed by Dr Chalmers Cater   . Difficult intubation    ER intubation 2016  . ESRD (end stage renal disease) ([place])    Tues, Th, Sat [place]  . Fever 03/12/2018  . Glaucoma   . Hemodialysis patient [facility])    has been on dialysis since approx 10/2016   . Hypertension    followed by [facility]  . Stroke [facility])    patient denies on 02/19/2017    Past Surgical History:  Procedure Laterality Date  . A/V FISTULAGRAM Left 11/27/2016   Procedure: A/V Fistulagram;  Surgeon: Waynetta Sandy, MD;  Location: [place] [facility];  Service: Cardiovascular;  Laterality: Left;  . A/V FISTULAGRAM Left 05/07/2017   Procedure: A/V FISTULAGRAM;  Surgeon: Waynetta Sandy, MD;  Location: [place] [facility];  Service: Cardiovascular;  Laterality: Left;  . A/V FISTULAGRAM Left 07/14/2017   Procedure: A/V FISTULAGRAM;  Surgeon: Waynetta Sandy, MD;  Location:  [place] [facility];  Service: Cardiovascular;  Laterality: Left;  . AV FISTULA PLACEMENT Left 03/12/2016   Procedure: Left Arm ARTERIOVENOUS (AV) FISTULA CREATION;  Surgeon: Waynetta Sandy, MD;  Location: [facility] OR;  Service: Vascular;  Laterality: Left;  . AV FISTULA PLACEMENT Left 07/18/2017   Procedure: INSERTION OF ARTERIOVENOUS (AV) GORE-TEX GRAFT ARM LEFT UPPER ARM;  Surgeon: Conrad [place], MD;  Location: [place];  Service: Vascular;  Laterality: Left;  . BASCILIC VEIN TRANSPOSITION Left 05/14/2016   Procedure: SECOND STAGE LEFT BASILIC VEIN TRANSPOSITION;  Surgeon: Waynetta Sandy, MD;  Location: [place];  Service: Vascular;  Laterality: Left;  . BRONCHOSCOPY  02/14/2015   for pulm hemorrhage  . CARDIAC CATHETERIZATION N/A 03/04/2016   Procedure: Left Heart Cath and Coronary Angiography;  Surgeon: Leonie Man, MD;  Location: [place] [facility];  Service: Cardiovascular;  Laterality: N/A;  . COLONOSCOPY WITH PROPOFOL N/A 04/07/2017   Procedure: COLONOSCOPY WITH PROPOFOL;  Surgeon: Ronnette Juniper, MD;  Location: [place];  Service: Gastroenterology;  Laterality: N/A;  . EYE SURGERY Left   . INSERTION OF DIALYSIS CATHETER Right 11/08/2016   Procedure: INSERTION OF DIALYSIS CATHETER;  Surgeon: Rosetta Posner, MD;  Location: [place];  Service: Vascular;  Laterality: Right;  . IR PARACENTESIS  11/13/2016  . LIGATION OF ARTERIOVENOUS  FISTULA Left 07/18/2017   Procedure: LIGATION OF ARTERIOVENOUS  FISTULA;  Surgeon: Conrad [place], MD;  Location: [place];  Service: Vascular;  Laterality: Left;  . PERIPHERAL VASCULAR BALLOON ANGIOPLASTY Left 11/27/2016   Procedure: Peripheral Vascular Balloon Angioplasty;  Surgeon: Waynetta Sandy, MD;  Location: [place] CV LAB;  Service: Cardiovascular;  Laterality: Left;  ARM FISTULA  . PERIPHERAL VASCULAR BALLOON ANGIOPLASTY Left 05/07/2017   Procedure: PERIPHERAL VASCULAR BALLOON ANGIOPLASTY;  Surgeon: Waynetta Sandy, MD;   Location: [place] CV [facility];  Service: Cardiovascular;  Laterality: Left;    Family History  Problem Relation Age of Onset  . Diabetes Mother     SOCIAL HISTORY: Social History   Socioeconomic History  . Marital status: Married    Spouse name: Not on file  . Number of children: Not on file  . Years of education: Not on file  . Highest education level: Not on file  Occupational History  . Not on file  Social Needs  . Financial resource strain: Not on file  . Food insecurity    Worry: Not on file    Inability: Not on file  . Transportation needs    Medical: Not on file    Non-medical: Not on file  Tobacco Use  . Smoking status: Former Smoker    Packs/day: 0.00    Years: 4.00    Pack years: 0.00    Quit date: 02/23/1983    Years since quitting: 36.2  . Smokeless tobacco: Never Used  Substance and Sexual Activity  . Alcohol use: No  . Drug use: No  . Sexual activity: Not on file  Lifestyle  . Physical activity    Days per week: Not on file    Minutes per session: Not on file  . Stress: Not on file  Relationships  . Social Herbalist on phone: Not on file    Gets together: Not on file    Attends religious service: Not on file    Active member of club or organization: Not on file    Attends meetings of clubs or organizations: Not on file    Relationship status: Not on file  . Intimate partner violence    Fear of current or ex partner: Not on file    Emotionally abused: Not on file    Physically abused: Not on file    Forced sexual activity: Not on file  Other Topics Concern  . Not on file  Social History Narrative  . Not on file    No Known Allergies  Current Outpatient Medications  Medication Sig Dispense Refill  . ACCU-CHEK SOFTCLIX LANCETS lancets Use as instructed 3 times daily before meals and at bedtime. E11.9 100 each 12  . aspirin EC 81 MG tablet Take 81 mg by mouth daily.    Marland Kitchen atorvastatin (LIPITOR) 40 MG tablet Take 1 tablet (40 mg  total) by mouth daily at 6 PM. 90 tablet 1  . b complex-vitamin c-folic acid (NEPHRO-VITE) 0.8 MG TABS tablet Take 1 tablet by mouth at bedtime.    . Blood Glucose Monitoring Suppl (ACCU-CHEK AVIVA) device Use as instructed 3 times daily before meals and at bedtime. E11.9 1 each 0  . gabapentin (NEURONTIN) 300 MG capsule Take 1 capsule (300 mg total) by mouth daily. 90 capsule 1  . glucose blood (ACCU-CHEK AVIVA PLUS) test strip Use 4 times daily before meals and at bedtime. E11.9 100 each 12  . insulin aspart (NOVOLOG) 100 UNIT/ML injection 0-12 units 3 times daily before meals as well as per sliding scale (Patient taking differently: Inject 0-12 Units  into the skin 3 (three) times daily with meals. per sliding scale) 10 mL 5  . insulin glargine (LANTUS) 100 UNIT/ML injection Inject 0.1 mLs (10 Units total) into the skin 2 (two) times daily. 30 mL 6  . Lancet Devices (ACCU-CHEK SOFTCLIX) lancets Use as instructed 3 times daily before meals and at bedtime. E11.9 1 each 12  . Misc. Devices MISC Tub transfer bench. Dx: acute discitis and low back pain 1 each 0  . RENAGEL 800 MG tablet Take 800-1,600 mg by mouth See admin instructions. Take 1600 mg by mouth 3 times daily with meals and take 800 mg by mouth twice daily with snacks     No current facility-administered medications for this visit.     ROS:   General:  No weight loss, Fever, chills  HEENT: No recent headaches, no nasal bleeding, no visual changes, no sore throat  Neurologic: No dizziness, blackouts, seizures. No recent symptoms of stroke or mini- stroke. No recent episodes of slurred speech, or temporary blindness.  Cardiac: No recent episodes of chest pain/pressure, no shortness of breath at rest.  No shortness of breath with exertion.  Denies history of atrial fibrillation or irregular heartbeat  Vascular: No history of rest pain in feet.  No history of claudication.  No history of non-healing ulcer, No history of DVT    Pulmonary: No home oxygen, no productive cough, no hemoptysis,  No asthma or wheezing  Musculoskeletal:  [ ]  Arthritis, [ ]  Low back pain,  [ ]  Joint pain  Hematologic:No history of hypercoagulable state.  No history of easy bleeding.  No history of anemia  Gastrointestinal: No hematochezia or melena,  No gastroesophageal reflux, no trouble swallowing  Urinary: [X]  chronic Kidney disease, [X]  on HD - []  MWF or [X]  TTHS, [ ]  Burning with urination, [ ]  Frequent urination, [ ]  Difficulty urinating;   Skin: No rashes  Psychological: No history of anxiety,  No history of depression   Physical Examination  Vitals:   04/28/19 1425  BP: (!) 164/65  Pulse: 95  Resp: 18  Temp: 97.8 F (36.6 C)  TempSrc: Temporal  SpO2: 96%  Weight: 156 lb (70.8 kg)  Height: 5\' 11"  (1.803 m)    Body mass index is 21.76 kg/m.  General:  Alert and oriented, no acute distress HEENT: Normal Neck: No JVD Pulmonary: Clear to auscultation bilaterally Cardiac: Regular Rate and Rhythm  Skin: No rash, multiple superficial abrasions over left arm AV graft see image below       Extremity Pulses: Palpable thrill left upper arm AV graft Musculoskeletal: No deformity or edema  Neurologic: Upper and lower extremity motor 5/5 and symmetric  ASSESSMENT: Multiple skin erosions and abrasions over left upper arm AV graft.  Some of these are probably consistent again with tape burns.  However some of these are puncture sites over the graft.  I believe most of these should heal up with rest of the graft.  However, he may need revision.  Under the current circumstances the skin is pretty beat up and I do not believe putting a graft in this would be in his best interest initially as we may have wound breakdown problems.   PLAN: Patient will be scheduled for placement of a palindrome catheter on this Friday, April 30, 2019.  Risk benefits possible complications of procedure details were discussed today with the  patient and his wife.  I will see the patient back in follow-up in 2 weeks to see how  well the skin has healed up.  At that point we would make a decision on whether or not the graft needs revision.  I discussed the procedure with the patient and family today on what to do if they have any bleeding from the graft as far as direct pressure were to hold this and to call 911.   Ruta Hinds, MD [facility] [place] Office: [phone] Pager: [phone]

## 2019-04-28 NOTE — H&P (View-Only) (Signed)
Referring Physician: Dr. Lorrene Reid  Patient name: Russell Price MRN: [id number] DOB: [birth year] Sex: male  REASON FOR CONSULT: Abrasions ulceration left arm AV graft  HPI: Nasir Sheikhidris Demarest is a 58 y.o. male, who has developed multiple ulcerations over his left arm AV graft over the last couple of weeks.  He states that some of this was caused from tape.  He he has never really had a bleeding episode although sometimes he has some prolonged bleeding after dialysis.  His dialysis today is Tuesday Thursday Saturday.  He does not take any anticoagulation other than aspirin.  Other medical problems include coronary artery disease, diabetes both of which are stable.  Past Medical History:  Diagnosis Date  . Back pain 03/12/2018  . CAD (coronary artery disease)    a. underwent cath in 02/2016 after an abnormal nuc which showed 2V CAD in RCA and OM3 with no good targets for PCI. Medical managment recommended.   . Chronic kidney disease   . Diabetes mellitus without complication ([place])    type II - followed by Dr Chalmers Cater   . Difficult intubation    ER intubation 2016  . ESRD (end stage renal disease) ([place])    Tues, Th, Sat [place]  . Fever 03/12/2018  . Glaucoma   . Hemodialysis patient [facility])    has been on dialysis since approx 10/2016   . Hypertension    followed by [facility]  . Stroke [facility])    patient denies on 02/19/2017    Past Surgical History:  Procedure Laterality Date  . A/V FISTULAGRAM Left 11/27/2016   Procedure: A/V Fistulagram;  Surgeon: Waynetta Sandy, MD;  Location: [place] [facility];  Service: Cardiovascular;  Laterality: Left;  . A/V FISTULAGRAM Left 05/07/2017   Procedure: A/V FISTULAGRAM;  Surgeon: Waynetta Sandy, MD;  Location: [place] [facility];  Service: Cardiovascular;  Laterality: Left;  . A/V FISTULAGRAM Left 07/14/2017   Procedure: A/V FISTULAGRAM;  Surgeon: Waynetta Sandy, MD;  Location:  [place] [facility];  Service: Cardiovascular;  Laterality: Left;  . AV FISTULA PLACEMENT Left 03/12/2016   Procedure: Left Arm ARTERIOVENOUS (AV) FISTULA CREATION;  Surgeon: Waynetta Sandy, MD;  Location: [facility] OR;  Service: Vascular;  Laterality: Left;  . AV FISTULA PLACEMENT Left 07/18/2017   Procedure: INSERTION OF ARTERIOVENOUS (AV) GORE-TEX GRAFT ARM LEFT UPPER ARM;  Surgeon: Conrad [place]urg, MD;  Location: [place];  Service: Vascular;  Laterality: Left;  . BASCILIC VEIN TRANSPOSITION Left 05/14/2016   Procedure: SECOND STAGE LEFT BASILIC VEIN TRANSPOSITION;  Surgeon: Waynetta Sandy, MD;  Location: [place];  Service: Vascular;  Laterality: Left;  . BRONCHOSCOPY  02/14/2015   for pulm hemorrhage  . CARDIAC CATHETERIZATION N/A 03/04/2016   Procedure: Left Heart Cath and Coronary Angiography;  Surgeon: Leonie Man, MD;  Location: [place] [facility];  Service: Cardiovascular;  Laterality: N/A;  . COLONOSCOPY WITH PROPOFOL N/A 04/07/2017   Procedure: COLONOSCOPY WITH PROPOFOL;  Surgeon: Ronnette Juniper, MD;  Location: [place];  Service: Gastroenterology;  Laterality: N/A;  . EYE SURGERY Left   . INSERTION OF DIALYSIS CATHETER Right 11/08/2016   Procedure: INSERTION OF DIALYSIS CATHETER;  Surgeon: Rosetta Posner, MD;  Location: [place];  Service: Vascular;  Laterality: Right;  . IR PARACENTESIS  11/13/2016  . LIGATION OF ARTERIOVENOUS  FISTULA Left 07/18/2017   Procedure: LIGATION OF ARTERIOVENOUS  FISTULA;  Surgeon: Conrad [place]ia Fields, MD;  Location: [place];  Service: Vascular;  Laterality: Left;  . PERIPHERAL VASCULAR BALLOON ANGIOPLASTY Left 11/27/2016   Procedure: Peripheral Vascular Balloon Angioplasty;  Surgeon: Waynetta Sandy, MD;  Location: [place] [facility];  Service: Cardiovascular;  Laterality: Left;  ARM FISTULA  . PERIPHERAL VASCULAR BALLOON ANGIOPLASTY Left 05/07/2017   Procedure: PERIPHERAL VASCULAR BALLOON ANGIOPLASTY;  Surgeon: Waynetta Sandy, MD;   Location: [place] [facility];  Service: Cardiovascular;  Laterality: Left;    Family History  Problem Relation Age of Onset  . Diabetes Mother     SOCIAL HISTORY: Social History   Socioeconomic History  . Marital status: Married    Spouse name: Not on file  . Number of children: Not on file  . Years of education: Not on file  . Highest education level: Not on file  Occupational History  . Not on file  Social Needs  . Financial resource strain: Not on file  . Food insecurity    Worry: Not on file    Inability: Not on file  . Transportation needs    Medical: Not on file    Non-medical: Not on file  Tobacco Use  . Smoking status: Former Smoker    Packs/day: 0.00    Years: 4.00    Pack years: 0.00    Quit date: 02/23/1983    Years since quitting: 36.2  . Smokeless tobacco: Never Used  Substance and Sexual Activity  . Alcohol use: No  . Drug use: No  . Sexual activity: Not on file  Lifestyle  . Physical activity    Days per week: Not on file    Minutes per session: Not on file  . Stress: Not on file  Relationships  . Social Herbalist on phone: Not on file    Gets together: Not on file    Attends religious service: Not on file    Active member of club or organization: Not on file    Attends meetings of clubs or organizations: Not on file    Relationship status: Not on file  . Intimate partner violence    Fear of current or ex partner: Not on file    Emotionally abused: Not on file    Physically abused: Not on file    Forced sexual activity: Not on file  Other Topics Concern  . Not on file  Social History Narrative  . Not on file    No Known Allergies  Current Outpatient Medications  Medication Sig Dispense Refill  . ACCU-CHEK SOFTCLIX LANCETS lancets Use as instructed 3 times daily before meals and at bedtime. E11.9 100 each 12  . aspirin EC 81 MG tablet Take 81 mg by mouth daily.    Marland Kitchen atorvastatin (LIPITOR) 40 MG tablet Take 1 tablet (40 mg  total) by mouth daily at 6 PM. 90 tablet 1  . b complex-vitamin c-folic acid (NEPHRO-VITE) 0.8 MG TABS tablet Take 1 tablet by mouth at bedtime.    . Blood Glucose Monitoring Suppl (ACCU-CHEK AVIVA) device Use as instructed 3 times daily before meals and at bedtime. E11.9 1 each 0  . gabapentin (NEURONTIN) 300 MG capsule Take 1 capsule (300 mg total) by mouth daily. 90 capsule 1  . glucose blood (ACCU-CHEK AVIVA PLUS) test strip Use 4 times daily before meals and at bedtime. E11.9 100 each 12  . insulin aspart (NOVOLOG) 100 UNIT/ML injection 0-12 units 3 times daily before meals as well as per sliding scale (Patient taking differently: Inject 0-12 Units  into the skin 3 (three) times daily with meals. per sliding scale) 10 mL 5  . insulin glargine (LANTUS) 100 UNIT/ML injection Inject 0.1 mLs (10 Units total) into the skin 2 (two) times daily. 30 mL 6  . Lancet Devices (ACCU-CHEK SOFTCLIX) lancets Use as instructed 3 times daily before meals and at bedtime. E11.9 1 each 12  . Misc. Devices MISC Tub transfer bench. Dx: acute discitis and low back pain 1 each 0  . RENAGEL 800 MG tablet Take 800-1,600 mg by mouth See admin instructions. Take 1600 mg by mouth 3 times daily with meals and take 800 mg by mouth twice daily with snacks     No current facility-administered medications for this visit.     ROS:   General:  No weight loss, Fever, chills  HEENT: No recent headaches, no nasal bleeding, no visual changes, no sore throat  Neurologic: No dizziness, blackouts, seizures. No recent symptoms of stroke or mini- stroke. No recent episodes of slurred speech, or temporary blindness.  Cardiac: No recent episodes of chest pain/pressure, no shortness of breath at rest.  No shortness of breath with exertion.  Denies history of atrial fibrillation or irregular heartbeat  Vascular: No history of rest pain in feet.  No history of claudication.  No history of non-healing ulcer, No history of DVT    Pulmonary: No home oxygen, no productive cough, no hemoptysis,  No asthma or wheezing  Musculoskeletal:  [ ]  Arthritis, [ ]  Low back pain,  [ ]  Joint pain  Hematologic:No history of hypercoagulable state.  No history of easy bleeding.  No history of anemia  Gastrointestinal: No hematochezia or melena,  No gastroesophageal reflux, no trouble swallowing  Urinary: [X]  chronic Kidney disease, [X]  on HD - []  MWF or [X]  TTHS, [ ]  Burning with urination, [ ]  Frequent urination, [ ]  Difficulty urinating;   Skin: No rashes  Psychological: No history of anxiety,  No history of depression   Physical Examination  Vitals:   04/28/19 1425  BP: (!) 164/65  Pulse: 95  Resp: 18  Temp: 97.8 F (36.6 C)  TempSrc: Temporal  SpO2: 96%  Weight: 156 lb (70.8 kg)  Height: 5\' 11"  (1.803 m)    Body mass index is 21.76 kg/m.  General:  Alert and oriented, no acute distress HEENT: Normal Neck: No JVD Pulmonary: Clear to auscultation bilaterally Cardiac: Regular Rate and Rhythm  Skin: No rash, multiple superficial abrasions over left arm AV graft see image below       Extremity Pulses: Palpable thrill left upper arm AV graft Musculoskeletal: No deformity or edema  Neurologic: Upper and lower extremity motor 5/5 and symmetric  ASSESSMENT: Multiple skin erosions and abrasions over left upper arm AV graft.  Some of these are probably consistent again with tape burns.  However some of these are puncture sites over the graft.  I believe most of these should heal up with rest of the graft.  However, he may need revision.  Under the current circumstances the skin is pretty beat up and I do not believe putting a graft in this would be in his best interest initially as we may have wound breakdown problems.   PLAN: Patient will be scheduled for placement of a palindrome catheter on this Friday, April 30, 2019.  Risk benefits possible complications of procedure details were discussed today with the  patient and his wife.  I will see the patient back in follow-up in 2 weeks to see how  well the skin has healed up.  At that point we would make a decision on whether or not the graft needs revision.  I discussed the procedure with the patient and family today on what to do if they have any bleeding from the graft as far as direct pressure were to hold this and to call 911.   Ruta Hinds, MD [facility] [place] Office: [phone] Pager: [phone]

## 2019-04-28 NOTE — Discharge Summary (Addendum)
Physician Discharge Summary  Russell Price [id number] DOB: 01/26/1961 DOA: 03/31/2019  PCP: Charlott Rakes, MD  Admit date: 03/31/2019 Discharge date: 04/03/2019  Time spent: 35 minutes  Recommendations for Outpatient Follow-up:  1. Hilar and mediastinal lymphadenopathy noted on CT, consider follow-up CT in 2 to 3 months 2. Cardiology Dr. Sallyanne Kuster in 1 month   Discharge Diagnoses:  Principal Problem:   Acute pulmonary edema ([place]) Active Problems:   Anemia of chronic disease   Diabetes mellitus type 2 in nonobese (HCC)   ESRD (end stage renal disease) ([place])   CAD (coronary artery disease)   Hemoptysis   Discharge Condition: Stable  Diet recommendation: Diabetic, renal  Filed Weights   04/03/19 0519 04/03/19 0810 04/03/19 1219  Weight: 69.9 kg 70.9 kg 68.4 kg    History of present illness:  Russell Price a 58 y.o.malewithhistory of ESRD on hemodialysis on Tuesday Thursday Saturday had missed his dialysis last 2 session due to clotted fistula which was declotted 2 days ago. Patient started becoming short of breath and has hemoptysis yesterday morning. ED Course:In the ER patient was afebrile EKG was showing normal sinus rhythm with peaked T waves and ST depression in the inferior and lateral leads. CT angiogram of the chest was showed pulmonary edema. Labs show potassium of 5 creatinine   Hospital Course:   1. Acute hypoxic respiratory failure/pulmonary edema -Due to missed hemodialysis from clotted AV fistula  -Improved after HD -Weaned off O2 -Resolved  2.  Fever -pt spiked a temp of 101, 2 nights ago -Blood cultures negative, afebrile since then, COVID-19 PCR is negative -Stable and afebrile this time  3.    ESRD on hemodialysis Tuesday Thursday Saturday -Had clotted AV graft so missed HD on Saturday and Tuesday, this was declotted -Now tolerating HD without issues, followed by nephrology this admission, see discussion above  4.  Abnormal EKG, elevated troponin -Suspect this is secondary to demand ischemia from acute pulmonary edema from missed HD as above -Seen by cardiology this admission, 2D echo with preserved EF, no wall motion abnormalities, he has known CAD, follow-up with cardiology in 1 month  5.  Type 2 diabetes mellitus -Continue Lantus, sliding scale insulin  6.  Hypertension -Continue amlodipine and Coreg  7.  Hilar and mediastinal lymphadenopathy -Likely reactive, follow-up as outpatient for this   consultants:   Cardiology and renal    Discharge Exam: Vitals:   04/03/19 1219 04/03/19 1310  BP: (!) 118/30 (!) 154/121  Pulse: 62 64  Resp: 12 16  Temp: 98.1 F (36.7 C) 98.5 F (36.9 C)  SpO2: 97% 100%    General: AAOx3 Cardiovascular: S1S2/RRR Respiratory: CTAB  Discharge Instructions   Discharge Instructions    Discharge instructions   Complete by: As directed    Renal Diet   Increase activity slowly   Complete by: As directed      Allergies as of 04/03/2019   No Known Allergies     Medication List    STOP taking these medications   amLODipine 10 MG tablet Commonly known as: NORVASC   furosemide 80 MG tablet Commonly known as: LASIX     TAKE these medications   Accu-Chek Aviva device Use as instructed 3 times daily before meals and at bedtime. E11.9   Accu-Chek Aviva Plus test strip Generic drug: glucose blood Use 4 times daily before meals and at bedtime. E11.9   accu-chek softclix lancets Use as instructed 3 times daily before meals and at bedtime. E11.9  Accu-Chek Softclix Lancets lancets Use as instructed 3 times daily before meals and at bedtime. E11.9   aspirin EC 81 MG tablet Take 81 mg by mouth daily.   atorvastatin 40 MG tablet Commonly known as: LIPITOR Take 1 tablet (40 mg total) by mouth daily at 6 PM.   b complex-vitamin c-folic acid 0.8 MG Tabs tablet Take 1 tablet by mouth at bedtime.   gabapentin 300 MG capsule Commonly  known as: NEURONTIN Take 1 capsule (300 mg total) by mouth daily.   insulin aspart 100 UNIT/ML injection Commonly known as: novoLOG 0-12 units 3 times daily before meals as well as per sliding scale What changed:   how much to take  how to take this  when to take this  additional instructions   insulin glargine 100 UNIT/ML injection Commonly known as: LANTUS Inject 0.1 mLs (10 Units total) into the skin 2 (two) times daily.   Misc. Devices Misc Tub transfer bench. Dx: acute discitis and low back pain   Renagel 800 MG tablet Generic drug: sevelamer Take 800-1,600 mg by mouth See admin instructions. Take 1600 mg by mouth 3 times daily with meals and take 800 mg by mouth twice daily with snacks      No Known Allergies Follow-up Information    Croitoru, Mihai, MD Follow up.   Specialty: Cardiology Why: You have a hospital follow-up appointment scheduled for 04/29/2019 at 9:40am. Please arrive 15 minute early for check-in. If this date/time does not work for you, please call our office to reschedule. Contact information: [address] [place] [address] [phone]            The results of significant diagnostics from this hospitalization (including imaging, microbiology, ancillary and laboratory) are listed below for reference.    Significant Diagnostic Studies: Ct Angio Chest Pe W/cm &/or Wo Cm  Result Date: 03/31/2019 CLINICAL DATA:  Hemoptysis. EXAM: CT ANGIOGRAPHY CHEST WITH CONTRAST TECHNIQUE: Multidetector CT imaging of the chest was performed using the standard protocol during bolus administration of intravenous contrast. Multiplanar CT image reconstructions and MIPs were obtained to evaluate the vascular anatomy. CONTRAST:  16mL OMNIPAQUE IOHEXOL 350 MG/ML SOLN COMPARISON:  Chest x-ray dated 03/31/2019 and chest CT dated 02/14/2015 FINDINGS: Cardiovascular: Satisfactory opacification of the pulmonary arteries to the segmental level. No  evidence of pulmonary embolism. Normal heart size. No pericardial effusion. Aortic atherosclerosis. Extensive coronary artery calcifications, progressed since the prior study. Stent in the left innominate and subclavian veins. Mediastinum/Nodes: Patient has progressive mediastinal and bilateral hilar adenopathy as well as multiple small lymph nodes in the axillae, increased in size since the prior study. The largest lymph node is approximately 10 mm in diameter in the precarinal region. Lungs/Pleura: There are hazy bilateral pulmonary infiltrates consistent with pulmonary edema. Small bilateral pleural effusions, right larger than left. Upper Abdomen: No acute abnormalities. Musculoskeletal: No chest wall abnormality. No acute or significant osseous findings. Review of the MIP images confirms the above findings. IMPRESSION: 1. No pulmonary emboli. 2. Bilateral pulmonary edema with small pleural effusions. 3. Chronic progressive slight mediastinal and hilar adenopathy, nonspecific. Electronically Signed   By: Lorriane Shire M.D.   On: 03/31/2019 20:53   Dg Chest Port 1 View  Result Date: 04/01/2019 CLINICAL DATA:  Hypoxia. EXAM: PORTABLE CHEST 1 VIEW COMPARISON:  March 31, 2019. FINDINGS: Stable cardiomegaly. No pneumothorax or pleural effusion is noted. Significantly improved right lower lobe opacity is noted. Left lower lobe opacity is noted concerning for edema or  inflammation. Bony thorax is unremarkable. IMPRESSION: Significantly improved right lower lobe opacity is noted. Grossly stable left lower lobe opacity is noted concerning for edema or inflammation. Electronically Signed   By: Marijo Conception M.D.   On: 04/01/2019 08:53   Dg Chest Port 1 View  Result Date: 03/31/2019 CLINICAL DATA:  Hemoptysis. Productive cough. Blood tinged sputum. EXAM: PORTABLE CHEST 1 VIEW COMPARISON:  Chest radiograph 02/06/2017 FINDINGS: Mild cardiomegaly. Vascular stent in the region of the left subclavian and left  axilla. Central perivascular haziness suspicious for pulmonary edema, additional are Kerley B-lines. More confluent opacities in the left perihilar, infrahilar, and right perihilar lung. No large pleural effusion. No pneumothorax. No acute osseous abnormalities are seen. IMPRESSION: 1. Cardiomegaly with pulmonary edema. 2. More confluent bilateral infrahilar and left perihilar opacities may represent confluent edema or superimposed infection. Electronically Signed   By: Keith Rake M.D.   On: 03/31/2019 18:35    Microbiology: No results found for this or any previous visit (from the past 240 hour(s)).   Labs: Basic Metabolic Panel: No results for input(s): NA, K, CL, CO2, GLUCOSE, BUN, CREATININE, CALCIUM, MG, PHOS in the last 168 hours. Liver Function Tests: No results for input(s): AST, ALT, ALKPHOS, BILITOT, PROT, ALBUMIN in the last 168 hours. No results for input(s): LIPASE, AMYLASE in the last 168 hours. No results for input(s): AMMONIA in the last 168 hours. CBC: No results for input(s): WBC, NEUTROABS, HGB, HCT, MCV, PLT in the last 168 hours. Cardiac Enzymes: No results for input(s): CKTOTAL, CKMB, CKMBINDEX, TROPONINI in the last 168 hours. BNP: BNP (last 3 results) No results for input(s): BNP in the last 8760 hours.  ProBNP (last 3 results) No results for input(s): PROBNP in the last 8760 hours.  CBG: No results for input(s): GLUCAP in the last 168 hours.     Signed:  Domenic Polite MD.  [facility] 04/28/2019, 4:02 PM

## 2019-04-29 ENCOUNTER — Other Ambulatory Visit (HOSPITAL_COMMUNITY)
Admission: RE | Admit: 2019-04-29 | Discharge: 2019-04-29 | Disposition: A | Discharge status: Home / Self Care | Payer: Medicare Other | Source: Ambulatory Visit | Attending: Vascular Surgery | Admitting: Vascular Surgery

## 2019-04-29 ENCOUNTER — Ambulatory Visit: Payer: Medicare Other | Admitting: Cardiovascular Disease

## 2019-04-29 DIAGNOSIS — Z01812 Encounter for preprocedural laboratory examination: Secondary | ICD-10-CM | POA: Insufficient documentation

## 2019-04-29 DIAGNOSIS — Z20828 Contact with and (suspected) exposure to other viral communicable diseases: Secondary | ICD-10-CM | POA: Diagnosis not present

## 2019-04-29 LAB — SARS CORONAVIRUS 2 (TAT 6-24 HRS): SARS Coronavirus 2: NEGATIVE

## 2019-04-30 ENCOUNTER — Ambulatory Visit (HOSPITAL_COMMUNITY)
Admission: RE | Admit: 2019-04-30 | Discharge: 2019-04-30 | Disposition: A | Discharge status: Home / Self Care | Payer: Medicare Other | Attending: Vascular Surgery | Admitting: Vascular Surgery

## 2019-04-30 ENCOUNTER — Ambulatory Visit (HOSPITAL_COMMUNITY): Payer: Medicare Other

## 2019-04-30 ENCOUNTER — Encounter (HOSPITAL_COMMUNITY)
Admission: RE | Disposition: A | Discharge status: Home / Self Care | Payer: Self-pay | Source: Home / Self Care | Attending: Vascular Surgery

## 2019-04-30 ENCOUNTER — Other Ambulatory Visit: Payer: Self-pay

## 2019-04-30 DIAGNOSIS — Z452 Encounter for adjustment and management of vascular access device: Secondary | ICD-10-CM

## 2019-04-30 DIAGNOSIS — E1122 Type 2 diabetes mellitus with diabetic chronic kidney disease: Secondary | ICD-10-CM | POA: Insufficient documentation

## 2019-04-30 DIAGNOSIS — Z87891 Personal history of nicotine dependence: Secondary | ICD-10-CM | POA: Diagnosis not present

## 2019-04-30 DIAGNOSIS — Z992 Dependence on renal dialysis: Secondary | ICD-10-CM | POA: Insufficient documentation

## 2019-04-30 DIAGNOSIS — Z79899 Other long term (current) drug therapy: Secondary | ICD-10-CM | POA: Diagnosis not present

## 2019-04-30 DIAGNOSIS — I2510 Atherosclerotic heart disease of native coronary artery without angina pectoris: Secondary | ICD-10-CM | POA: Insufficient documentation

## 2019-04-30 DIAGNOSIS — Z7982 Long term (current) use of aspirin: Secondary | ICD-10-CM | POA: Diagnosis not present

## 2019-04-30 DIAGNOSIS — N186 End stage renal disease: Secondary | ICD-10-CM | POA: Insufficient documentation

## 2019-04-30 DIAGNOSIS — I120 Hypertensive chronic kidney disease with stage 5 chronic kidney disease or end stage renal disease: Secondary | ICD-10-CM | POA: Insufficient documentation

## 2019-04-30 DIAGNOSIS — Z794 Long term (current) use of insulin: Secondary | ICD-10-CM | POA: Diagnosis not present

## 2019-04-30 HISTORY — PX: DIALYSIS/PERMA CATHETER INSERTION: CATH118288

## 2019-04-30 LAB — POCT I-STAT EG7
Acid-Base Excess: 7 mmol/L — ABNORMAL HIGH (ref 0.0–2.0)
Bicarbonate: 34 mmol/L — ABNORMAL HIGH (ref 20.0–28.0)
Calcium, Ion: 1.16 mmol/L (ref 1.15–1.40)
HCT: 37 % — ABNORMAL LOW (ref 39.0–52.0)
Hemoglobin: 12.6 g/dL — ABNORMAL LOW (ref 13.0–17.0)
O2 Saturation: 50 %
Potassium: 4.6 mmol/L (ref 3.5–5.1)
Sodium: 139 mmol/L (ref 135–145)
TCO2: 36 mmol/L — ABNORMAL HIGH (ref 22–32)
pCO2, Ven: 57.2 mmHg (ref 44.0–60.0)
pH, Ven: 7.383 (ref 7.250–7.430)
pO2, Ven: 28 mmHg — CL (ref 32.0–45.0)

## 2019-04-30 SURGERY — DIALYSIS/PERMA CATHETER INSERTION
Anesthesia: LOCAL | Site: Chest | Laterality: Right

## 2019-04-30 MED ORDER — SODIUM CHLORIDE 0.9 % IV SOLN: 250.0000 mL | INTRAVENOUS | Status: DC | PRN

## 2019-04-30 MED ORDER — HYDRALAZINE HCL 20 MG/ML IJ SOLN: 5.0000 mg | INTRAMUSCULAR | Status: DC | PRN

## 2019-04-30 MED ORDER — HEPARIN SODIUM (PORCINE) 1000 UNIT/ML IJ SOLN
INTRAMUSCULAR | Status: AC
  Filled 2019-04-30: qty 1

## 2019-04-30 MED ORDER — HEPARIN (PORCINE) IN NACL 1000-0.9 UT/500ML-% IV SOLN
INTRAVENOUS | Status: AC
  Filled 2019-04-30: qty 500

## 2019-04-30 MED ORDER — FENTANYL CITRATE (PF) 100 MCG/2ML IJ SOLN
INTRAMUSCULAR | Status: DC | PRN
  Administered 2019-04-30: 50 ug via INTRAVENOUS

## 2019-04-30 MED ORDER — SODIUM CHLORIDE 0.9% FLUSH: 3.0000 mL | Freq: Two times a day (BID) | INTRAVENOUS | Status: DC

## 2019-04-30 MED ORDER — HEPARIN SODIUM (PORCINE) 1000 UNIT/ML IJ SOLN
INTRAMUSCULAR | Status: DC | PRN
  Administered 2019-04-30: 3400 [IU] via INTRAVENOUS

## 2019-04-30 MED ORDER — MIDAZOLAM HCL 2 MG/2ML IJ SOLN
INTRAMUSCULAR | Status: DC | PRN
  Administered 2019-04-30 (×2): 1 mg via INTRAVENOUS

## 2019-04-30 MED ORDER — SODIUM CHLORIDE 0.9% FLUSH: 3.0000 mL | INTRAVENOUS | Status: DC | PRN

## 2019-04-30 MED ORDER — LIDOCAINE HCL (PF) 1 % IJ SOLN
INTRAMUSCULAR | Status: AC
  Filled 2019-04-30: qty 30

## 2019-04-30 MED ORDER — FENTANYL CITRATE (PF) 100 MCG/2ML IJ SOLN
INTRAMUSCULAR | Status: AC
  Filled 2019-04-30: qty 2

## 2019-04-30 MED ORDER — MIDAZOLAM HCL 2 MG/2ML IJ SOLN
INTRAMUSCULAR | Status: AC
  Filled 2019-04-30: qty 2

## 2019-04-30 MED ORDER — LABETALOL HCL 5 MG/ML IV SOLN: 10.0000 mg | INTRAVENOUS | Status: DC | PRN

## 2019-04-30 MED ORDER — ACETAMINOPHEN 325 MG PO TABS: 650.0000 mg | ORAL_TABLET | ORAL | Status: DC | PRN

## 2019-04-30 MED ORDER — HEPARIN (PORCINE) IN NACL 1000-0.9 UT/500ML-% IV SOLN
INTRAVENOUS | Status: DC | PRN
  Administered 2019-04-30: 500 mL

## 2019-04-30 MED ORDER — ONDANSETRON HCL 4 MG/2ML IJ SOLN: 4.0000 mg | Freq: Four times a day (QID) | INTRAMUSCULAR | Status: DC | PRN

## 2019-04-30 SURGICAL SUPPLY — 6 items
CATH PALINDROME RT-P 15FX23CM (CATHETERS) ×2 IMPLANT
COVER DOME SNAP 22 D (MISCELLANEOUS) ×2 IMPLANT
PROTECTION STATION PRESSURIZED (MISCELLANEOUS) ×2
SHEATH PROBE COVER 6X72 (BAG) ×2 IMPLANT
STATION PROTECTION PRESSURIZED (MISCELLANEOUS) ×1 IMPLANT
TRAY PV CATH (CUSTOM PROCEDURE TRAY) ×2 IMPLANT

## 2019-04-30 NOTE — Progress Notes (Addendum)
Discharge instructions reviewed with pt and his wife (via telephone) both voice understanding. Ambulated in hallway tol well

## 2019-04-30 NOTE — Progress Notes (Signed)
Dr Oneida Alar called and informed of bp readings. No new orders at this time.

## 2019-04-30 NOTE — Interval H&P Note (Signed)
History and Physical Interval Note:  04/30/2019 7:31 AM  Russell Price  has presented today for surgery, with the diagnosis of instage renal.  The various methods of treatment have been discussed with the patient and family. After consideration of risks, benefits and other options for treatment, the patient has consented to  Procedure(s): DIALYSIS/PERMA CATHETER INSERTION (N/A) as a surgical intervention.  The patient's history has been reviewed, patient examined, no change in status, stable for surgery.  I have reviewed the patient's chart and labs.  Questions were answered to the patient's satisfaction.     Ruta Hinds

## 2019-04-30 NOTE — Interval H&P Note (Signed)
History and Physical Interval Note:  04/30/2019 7:37 AM  Russell Price  has presented today for surgery, with the diagnosis of instage renal.  The various methods of treatment have been discussed with the patient and family. After consideration of risks, benefits and other options for treatment, the patient has consented to  Procedure(s): DIALYSIS/PERMA CATHETER INSERTION (N/A) as a surgical intervention.  The patient's history has been reviewed, patient examined, no change in status, stable for surgery.  I have reviewed the patient's chart and labs.  Questions were answered to the patient's satisfaction.     Ruta Hinds

## 2019-04-30 NOTE — Progress Notes (Signed)
Xray called to check when they will be up to do CXR. States that someone has signed out to come up

## 2019-04-30 NOTE — Discharge Instructions (Signed)
Vascular Access for Hemodialysis        A vascular access is a connection to the blood inside your blood vessels that allows blood to be easily removed from your body and returned to your body during kidney dialysis (hemodialysis). Hemodialysis is a procedure in which a machine outside of the body filters the blood of a person whose kidneys are no longer working properly. There are three types of vascular accesses:  Arteriovenous fistula (AVF). This is a connection between an artery and a vein (usually in the arm) that is made by sewing them together. Blood in the artery flows directly into the vein, causing it to get larger over time. This makes it easier for the vein to be used for hemodialysis. An arteriovenous fistula takes 1-6 months to develop after surgery.  Arteriovenous graft (AVG). This is a connection between an artery and a vein in the arm that is made with a tube. An arteriovenous graft can be used within 2-3 weeks of surgery.  Venous catheter. This is a thin, flexible tube that is placed in a large vein (usually in the neck, chest, or groin). A venous catheter for hemodialysis contains two tubes that come out of the skin. A venous catheter can be used right away. It is usually used as a temporary access if you need hemodialysis before a fistula or graft has developed, or if kidney failure is sudden (acute) and likely to improve without the need for long-term dialysis. It may also be used as a permanent access if a fistula or graft cannot be created. Which type of access is best for me? The type of access that is best for you depends on the size and strength of your veins, your age, and any other health problems that you have, such as diabetes. An ultrasound test may be used to look at your veins to help make this decision. A fistula is usually the preferred type of access. It can last several years and is less likely than the other types of accesses to become infected or to cause a blood  clot within a blood vessel (thrombosis). However, a fistula is not an option for everyone. If your veins are not the right size or if the fistula does not develop properly, a graft may be used instead. Grafts require you to have strong veins. If your veins are not strong enough for a graft, a catheter may be used. Catheters are more likely than fistulas and grafts to become infected or to have a thrombosis. Sometimes, only one type of access is an option. Your health care provider will help you determine which type of access is best for you. How is a vascular access used? The way that the access is used depends on the type of access:  If the access is a fistula or graft, two needles are inserted through the skin into the access before each hemodialysis session. Blood leaves the body through one of the needles and travels through a tube to the hemodialysis machine (dialyzer). Then it flows through another tube and returns to the body through the second needle.  If the access is a catheter, one tube is connected directly to the tube that leads to the dialyzer, and the other tube is connected to a tube that leads away from the dialyzer. Blood leaves the body through one tube and returns to the body through the other. What problems can occur with vascular access?  A blood clot within a blood vessel (thrombosis).  Thrombosis can lead to a narrowing of a blood vessel (stenosis). If thrombosis occurs frequently, another access site may be created as a backup.  Infection.  Heart enlargement (cardiomegaly) and heart failure. Changes in blood flow may cause an increase in blood pressure or heart rate, making your heart work harder to pump blood. These problems are most likely to occur with a venous catheter and least likely to occur with an arteriovenous fistula. How do I care for my vascular access?  Wear a medical alert bracelet. In case of an emergency, this bracelet tells health care providers that you  are a dialysis patient and allows them to care for your veins appropriately. If you have a graft or fistula:  A "bruit" is a noise that is heard with a stethoscope, and a "thrill" is a vibration that is felt over the graft or fistula. The presence of the bruit and thrill indicates that the access is working. You will be taught to feel for the thrill each day. If this is not felt, the access may be clotted. Call your health care provider.  Keep your arm straight and raised (elevated) above your heart while the access site is healing.  You may freely use the arm where your vascular access is located after the site heals. Keep the following in mind: ? Avoid pressure on the arm. ? Avoid lifting heavy objects with the arm. ? Avoid sleeping on the arm. ? Avoid wearing tight-sleeved shirts or jewelry around the graft or fistula.  Do not allow blood pressure monitoring or needle punctures on the side where the graft or fistula is located.  With permission from your health care provider, you may do exercises to help with blood flow through a fistula. These exercises involve squeezing a rubber ball or other soft objects as instructed.  Wash your access site according to directions from your health care provider. If you have a venous catheter:  Keep the insertion site clean and dry at all times.  If you are told you can shower after the site heals, use a protective covering over the catheter to keep it dry.  Follow directions from your health care provider for bandage (dressing) changes.  Ask your health care provider what activities are safe for you. You may be restricted from lifting or making repetitive arm movements on the side with the catheter. Contact a health care provider if:  Swelling around the graft or fistula gets worse.  You develop new pain.  Your catheter gets damaged. Get help right away if:  You have pain, numbness, an unusual pale skin color, or blue fingers or sores at  the tips of your fingers in the hand on the side of your fistula.  You have chills.  You have a fever.  You have pus or other fluid (drainage) at the vascular access site.  You develop skin redness or red streaking on the skin around, above, or below the vascular access.  You have bleeding at the vascular access that cannot be easily controlled.  Your catheter gets pulled out of place.  You feel your heart racing or skipping beats.  You have chest pain. Summary  A vascular access is a connection to the blood inside your blood vessels that allows blood to be easily removed from your body and returned to your body during kidney dialysis (hemodialysis).  There are three types of vascular accesses.The type of access that is best for you depends on the size and strength of your  veins, your age, and any other health problems that you have, such as diabetes. °· A fistula is usually the preferred type of access, although it is not an option for everyone. It can last several years and is less likely than the other types of accesses to become infected or to cause a blood clot within a blood vessel (thrombosis). °· Wear a medical alert bracelet. In case of an emergency, this tells health care providers that you are a dialysis patient. °This information is not intended to replace advice given to you by your health care provider. Make sure you discuss any questions you have with your health care provider. °Document Released: 07/27/2002 Document Revised: 08/25/2018 Document Reviewed: 05/31/2016 °Elsevier Patient Education © 2020 [employer] °Moderate Conscious Sedation, Adult, Care After °These instructions provide you with information about caring for yourself after your procedure. Your health care provider may also give you more specific instructions. Your treatment has been planned according to current medical practices, but problems sometimes occur. Call your health care provider if you have any  problems or questions after your procedure. °What can I expect after the procedure? °After your procedure, it is common: °· To feel sleepy for several hours. °· To feel clumsy and have poor balance for several hours. °· To have poor judgment for several hours. °· To vomit if you eat too soon. °Follow these instructions at home: °For at least 24 hours after the procedure: ° °· Do not: °? Participate in activities where you could fall or become injured. °? Drive. °? Use heavy machinery. °? Drink alcohol. °? Take sleeping pills or medicines that cause drowsiness. °? Make important decisions or sign legal documents. °? Take care of children on your own. °· Rest. °Eating and drinking °· Follow the diet recommended by your health care provider. °· If you vomit: °? Drink water, juice, or soup when you can drink without vomiting. °? Make sure you have little or no nausea before eating solid foods. °General instructions °· Have a responsible adult stay with you until you are awake and alert. °· Take over-the-counter and prescription medicines only as told by your health care provider. °· If you smoke, do not smoke without supervision. °· Keep all follow-up visits as told by your health care provider. This is important. °Contact a health care provider if: °· You keep feeling nauseous or you keep vomiting. °· You feel light-headed. °· You develop a rash. °· You have a fever. °Get help right away if: °· You have trouble breathing. °This information is not intended to replace advice given to you by your health care provider. Make sure you discuss any questions you have with your health care provider. °Document Released: 02/24/2013 Document Revised: 04/18/2017 Document Reviewed: 08/26/2015 °Elsevier Patient Education © 2020 [employer] ° °

## 2019-04-30 NOTE — Op Note (Addendum)
Procedure: Ultrasound-guided insertion of Palindrome catheter 23 cm right internal jugular vein  Preoperative diagnosis: End-stage renal disease  Postoperative diagnosis: Same  Anesthesia: Local with IV sedation  Operative findings: 23 cm Palindrome catheter right internal jugular vein  Operative details: After obtaining informed consent, the patient was taken to the [facility] lab. The patient was placed in supine position on the operating room table. After adequate sedation the patient's entire neck and chest were prepped and draped in usual sterile fashion.  Ultrasound was used to identify the patient's right internal jugular vein. This had normal compressibility and respiratory variation. Local anesthesia was infiltrated over the right jugular vein.  Using ultrasound guidance, the right internal jugular vein was successfully cannulated.  A 0.035 J-tipped guidewire was threaded into the right internal jugular vein and into the superior vena cava followed by the inferior vena cava under fluoroscopic guidance.   Next sequential 12 and 14 dilators were placed over the guidewire into the right atrium.  A 16 French dilator with a peel-away sheath was then placed over the guidewire into the right atrium.   The guidewire and dilator were removed. A 23 cm Palindrome catheter was then placed through the peel away sheath into the right atrium.  The catheter was then tunneled subcutaneously, cut to length, and the hub attached. The catheter was noted to flush and draw easily. The catheter was inspected under fluoroscopy and found with its tip to be in the right atrium without any kinks throughout its course. The catheter was sutured to the skin with nylon sutures. The neck insertion site was closed with monocryl stitch. The catheter was then loaded with concentrated Heparin solution. A dry sterile dressing was applied.  The patient tolerated procedure well and there were no complications. Instrument sponge and needle  counts correct in the case. The patient was taken to the recovery room in stable condition. Chest x-ray will be obtained in the recovery room.  Ruta Hinds, MD [facility] Office: [phone] Pager: [phone]

## 2019-05-03 ENCOUNTER — Encounter: Payer: Self-pay | Admitting: Family Medicine

## 2019-05-03 ENCOUNTER — Ambulatory Visit: Payer: Medicare Other | Attending: Family Medicine | Admitting: Family Medicine

## 2019-05-03 ENCOUNTER — Other Ambulatory Visit: Payer: Self-pay

## 2019-05-03 VITALS — BP 195/64 | HR 47 | Temp 98.0°F | Ht 71.0 in | Wt 161.0 lb

## 2019-05-03 DIAGNOSIS — I120 Hypertensive chronic kidney disease with stage 5 chronic kidney disease or end stage renal disease: Secondary | ICD-10-CM | POA: Insufficient documentation

## 2019-05-03 DIAGNOSIS — N528 Other male erectile dysfunction: Secondary | ICD-10-CM | POA: Diagnosis not present

## 2019-05-03 DIAGNOSIS — Z8673 Personal history of transient ischemic attack (TIA), and cerebral infarction without residual deficits: Secondary | ICD-10-CM | POA: Insufficient documentation

## 2019-05-03 DIAGNOSIS — E785 Hyperlipidemia, unspecified: Secondary | ICD-10-CM | POA: Diagnosis not present

## 2019-05-03 DIAGNOSIS — I2510 Atherosclerotic heart disease of native coronary artery without angina pectoris: Secondary | ICD-10-CM | POA: Diagnosis not present

## 2019-05-03 DIAGNOSIS — H409 Unspecified glaucoma: Secondary | ICD-10-CM | POA: Insufficient documentation

## 2019-05-03 DIAGNOSIS — E1122 Type 2 diabetes mellitus with diabetic chronic kidney disease: Secondary | ICD-10-CM | POA: Insufficient documentation

## 2019-05-03 DIAGNOSIS — H5461 Unqualified visual loss, right eye, normal vision left eye: Secondary | ICD-10-CM | POA: Diagnosis not present

## 2019-05-03 DIAGNOSIS — L309 Dermatitis, unspecified: Secondary | ICD-10-CM | POA: Diagnosis not present

## 2019-05-03 DIAGNOSIS — Z833 Family history of diabetes mellitus: Secondary | ICD-10-CM | POA: Diagnosis not present

## 2019-05-03 DIAGNOSIS — Z7982 Long term (current) use of aspirin: Secondary | ICD-10-CM | POA: Insufficient documentation

## 2019-05-03 DIAGNOSIS — E1140 Type 2 diabetes mellitus with diabetic neuropathy, unspecified: Secondary | ICD-10-CM | POA: Diagnosis not present

## 2019-05-03 DIAGNOSIS — Z79899 Other long term (current) drug therapy: Secondary | ICD-10-CM | POA: Diagnosis not present

## 2019-05-03 DIAGNOSIS — N186 End stage renal disease: Secondary | ICD-10-CM | POA: Diagnosis not present

## 2019-05-03 DIAGNOSIS — E1169 Type 2 diabetes mellitus with other specified complication: Secondary | ICD-10-CM | POA: Insufficient documentation

## 2019-05-03 DIAGNOSIS — Z992 Dependence on renal dialysis: Secondary | ICD-10-CM | POA: Diagnosis not present

## 2019-05-03 DIAGNOSIS — Z794 Long term (current) use of insulin: Secondary | ICD-10-CM | POA: Diagnosis not present

## 2019-05-03 DIAGNOSIS — I10 Essential (primary) hypertension: Secondary | ICD-10-CM | POA: Diagnosis not present

## 2019-05-03 LAB — POCT GLYCOSYLATED HEMOGLOBIN (HGB A1C): HbA1c, POC (controlled diabetic range): 7 % (ref 0.0–7.0)

## 2019-05-03 LAB — GLUCOSE, POCT (MANUAL RESULT ENTRY): POC Glucose: 300 mg/dl — AB (ref 70–99)

## 2019-05-03 MED ORDER — MISC. DEVICES MISC: 0 refills | Status: DC

## 2019-05-03 MED ORDER — TADALAFIL 10 MG PO TABS: 10.0000 mg | ORAL_TABLET | ORAL | 1 refills | Status: DC | PRN

## 2019-05-03 MED ORDER — TRIAMCINOLONE ACETONIDE 0.1 % EX CREA: 1.0000 "application " | TOPICAL_CREAM | Freq: Two times a day (BID) | CUTANEOUS | 1 refills | Status: DC

## 2019-05-03 MED FILL — TRIAMCINOLONE ACETONIDE 0.1: 0.1 | 20 days supply | Qty: 45 | Fill #0

## 2019-05-03 MED FILL — Lidocaine HCl Local Preservative Free (PF) Inj 1%: INTRAMUSCULAR | Qty: 30 | Status: AC

## 2019-05-03 NOTE — Progress Notes (Signed)
Subjective:  Patient ID: Russell Price, male    DOB: June 24, 1960  Age: 58 y.o. MRN: [id number]  CC: Diabetes   HPI Russell Price is a61 year old male with a history of hypertension, type 2 diabetes mellitus (A1c7.0), diabetic neuropathy, blind in the right eye, end-stage renal disease (currently on hemodialysis on Tuesdays, Thursdays and Saturdays) here for follow-up visit. He was admitted for central line placement 3 days ago and ever since complains of itching on his posterior neck with associated development of rash.  He also has itching at the site of his left upper arm AV fistula and is wondering if he is allergic to the tape that was used.  Denies itching and other body parts.  His diabetes is stable and he denies hypoglycemia and is compliant with his medications. His blood pressure is significantly elevated and amlodipine and furosemide have been discontinued during hospitalization due to hypotension.  He also informs me his blood pressure fluctuates and usually dips during hemodialysis.  He has taken his carvedilol this morning he says. With regards to his end-stage renal disease he is currently followed by the [facility] transplant clinic. At his last visit Viagra was prescribed for erectile dysfunction which he was not able to afford and he is wondering if he can receive something else for this.  Past Medical History:  Diagnosis Date  . Back pain 03/12/2018  . CAD (coronary artery disease)    a. underwent cath in 02/2016 after an abnormal nuc which showed 2V CAD in RCA and OM3 with no good targets for PCI. Medical managment recommended.   . Chronic kidney disease   . Diabetes mellitus without complication ([place])    type II - followed by Dr Chalmers Cater   . Difficult intubation    ER intubation 2016  . ESRD (end stage renal disease) ([place])    Tues, Th, Sat [place]  . Fever 03/12/2018  . Glaucoma   . Hemodialysis patient [facility])    has been on  dialysis since approx 10/2016   . Hypertension    followed by [facility]  . Stroke [facility])    patient denies on 02/19/2017     Past Surgical History:  Procedure Laterality Date  . A/V FISTULAGRAM Left 11/27/2016   Procedure: A/V Fistulagram;  Surgeon: Waynetta Sandy, MD;  Location: [place] [facility];  Service: Cardiovascular;  Laterality: Left;  . A/V FISTULAGRAM Left 05/07/2017   Procedure: A/V FISTULAGRAM;  Surgeon: Waynetta Sandy, MD;  Location: [place] [facility];  Service: Cardiovascular;  Laterality: Left;  . A/V FISTULAGRAM Left 07/14/2017   Procedure: A/V FISTULAGRAM;  Surgeon: Waynetta Sandy, MD;  Location: [place] [facility];  Service: Cardiovascular;  Laterality: Left;  . AV FISTULA PLACEMENT Left 03/12/2016   Procedure: Left Arm ARTERIOVENOUS (AV) FISTULA CREATION;  Surgeon: Waynetta Sandy, MD;  Location: [facility] OR;  Service: Vascular;  Laterality: Left;  . AV FISTULA PLACEMENT Left 07/18/2017   Procedure: INSERTION OF ARTERIOVENOUS (AV) GORE-TEX GRAFT ARM LEFT UPPER ARM;  Surgeon: Conrad Woods Landing-Jelm, MD;  Location: [place];  Service: Vascular;  Laterality: Left;  . BASCILIC VEIN TRANSPOSITION Left 05/14/2016   Procedure: SECOND STAGE LEFT BASILIC VEIN TRANSPOSITION;  Surgeon: Waynetta Sandy, MD;  Location: [place];  Service: Vascular;  Laterality: Left;  . BRONCHOSCOPY  02/14/2015   for pulm hemorrhage  . CARDIAC CATHETERIZATION N/A 03/04/2016   Procedure: Left Heart Cath and Coronary Angiography;  Surgeon: Leonie Green  Ellyn Hack, MD;  Location: [place] [facility];  Service: Cardiovascular;  Laterality: N/A;  . COLONOSCOPY WITH PROPOFOL N/A 04/07/2017   Procedure: COLONOSCOPY WITH PROPOFOL;  Surgeon: Ronnette Juniper, MD;  Location: [place];  Service: Gastroenterology;  Laterality: N/A;  . DIALYSIS/PERMA CATHETER INSERTION Right 04/30/2019   Procedure: DIALYSIS/PERMA CATHETER INSERTION;  Surgeon: Elam Dutch, MD;  Location: [place] [facility];  Service: Cardiovascular;  Laterality: Right;  . EYE SURGERY Left   . INSERTION OF DIALYSIS CATHETER Right 11/08/2016   Procedure: INSERTION OF DIALYSIS CATHETER;  Surgeon: Rosetta Posner, MD;  Location: [place];  Service: Vascular;  Laterality: Right;  . IR PARACENTESIS  11/13/2016  . LIGATION OF ARTERIOVENOUS  FISTULA Left 07/18/2017   Procedure: LIGATION OF ARTERIOVENOUS  FISTULA;  Surgeon: Conrad Lyons, MD;  Location: [place];  Service: Vascular;  Laterality: Left;  . PERIPHERAL VASCULAR BALLOON ANGIOPLASTY Left 11/27/2016   Procedure: Peripheral Vascular Balloon Angioplasty;  Surgeon: Waynetta Sandy, MD;  Location: [place] [facility];  Service: Cardiovascular;  Laterality: Left;  ARM FISTULA  . PERIPHERAL VASCULAR BALLOON ANGIOPLASTY Left 05/07/2017   Procedure: PERIPHERAL VASCULAR BALLOON ANGIOPLASTY;  Surgeon: Waynetta Sandy, MD;  Location: [place] [facility];  Service: Cardiovascular;  Laterality: Left;    Family History  Problem Relation Age of Onset  . Diabetes Mother     No Known Allergies  Outpatient Medications Prior to Visit  Medication Sig Dispense Refill  . aspirin EC 81 MG tablet Take 81 mg by mouth daily.    Marland Kitchen atorvastatin (LIPITOR) 40 MG tablet Take 1 tablet (40 mg total) by mouth daily at 6 PM. (Patient taking differently: Take 40 mg by mouth at bedtime. ) 90 tablet 1  . carvedilol (COREG) 6.25 MG tablet Take 6.25 mg by mouth 2 (two) times daily.    Marland Kitchen gabapentin (NEURONTIN) 300 MG capsule Take 1 capsule (300 mg total) by mouth daily. 90 capsule 1  . insulin aspart (NOVOLOG) 100 UNIT/ML injection 0-12 units 3 times daily before meals as well as per sliding scale (Patient taking differently: Inject 0-12 Units into the skin 3 (three) times daily with meals. per sliding scale) 10 mL 5  . insulin glargine (LANTUS) 100 UNIT/ML injection Inject 0.1 mLs (10 Units total) into the skin 2 (two) times daily. 30 mL 6  . Lancet Devices (ACCU-CHEK  SOFTCLIX) lancets Use as instructed 3 times daily before meals and at bedtime. E11.9 1 each 12  . Misc. Devices MISC Tub transfer bench. Dx: acute discitis and low back pain 1 each 0  . RENAGEL 800 MG tablet Take 800-2,400 mg by mouth See admin instructions. Take 2400 mg by mouth 3 times daily with meals and take 800 mg by mouth twice daily with snacks    . timolol (BETIMOL) 0.5 % ophthalmic solution Place 1 drop into the left eye 2 (two) times daily.    Marland Kitchen ACCU-CHEK SOFTCLIX LANCETS lancets Use as instructed 3 times daily before meals and at bedtime. E11.9 100 each 12  . b complex-vitamin c-folic acid (NEPHRO-VITE) 0.8 MG TABS tablet Take 1 tablet by mouth at bedtime.    . Blood Glucose Monitoring Suppl (ACCU-CHEK AVIVA) device Use as instructed 3 times daily before meals and at bedtime. E11.9 1 each 0  . glucose blood (ACCU-CHEK AVIVA PLUS) test strip Use 4 times daily before meals and at bedtime. E11.9 100 each 12   No facility-administered medications prior to visit.  ROS Review of Systems  Constitutional: Negative for activity change and appetite change.  HENT: Negative for sinus pressure and sore throat.   Eyes: Negative for visual disturbance.  Respiratory: Negative for cough, chest tightness and shortness of breath.   Cardiovascular: Negative for chest pain and leg swelling.  Gastrointestinal: Negative for abdominal distention, abdominal pain, constipation and diarrhea.  Endocrine: Negative.   Genitourinary: Negative for dysuria.  Musculoskeletal: Negative for joint swelling and myalgias.  Skin: Positive for rash.  Allergic/Immunologic: Negative.   Neurological: Negative for weakness, light-headedness and numbness.  Psychiatric/Behavioral: Negative for dysphoric mood and suicidal ideas.    Objective:  BP (!) 195/64   Pulse (!) 47   Temp 98 F (36.7 C) (Oral)   Ht 5\' 11"  (1.803 m)   Wt 161 lb (73 kg)   SpO2 100%   BMI 22.45 kg/m   BP/Weight 05/03/2019 04/30/2019  [id number]  Systolic BP 0000000 98 123456  Diastolic BP 64 83 65  Wt. (Lbs) 161 150 156  BMI 22.45 20.92 21.76      Physical Exam Constitutional:      Appearance: He is well-developed.  Neck:     Vascular: No JVD.  Cardiovascular:     Rate and Rhythm: Normal rate.     Heart sounds: Normal heart sounds. No murmur.  Pulmonary:     Effort: Pulmonary effort is normal.     Breath sounds: Normal breath sounds. No wheezing or rales.  Chest:     Chest wall: No tenderness.  Abdominal:     General: Bowel sounds are normal. There is no distension.     Palpations: Abdomen is soft. There is no mass.     Tenderness: There is no abdominal tenderness.  Musculoskeletal:        General: Normal range of motion.     Right lower leg: No edema.     Left lower leg: No edema.  Skin:    Comments: Rash on right side of neck which is scaly Rash on left upper arm and region of AV fistula with associated scaling and xerosis  Neurological:     Mental Status: He is alert and oriented to person, place, and time.  Psychiatric:        Mood and Affect: Mood normal.     CMP Latest Ref Rng & Units 04/30/2019 04/03/2019 04/02/2019  Glucose 70 - 99 mg/dL - 128(H) 281(H)  BUN 6 - 20 mg/dL - 38(H) 84(H)  Creatinine 0.61 - 1.24 mg/dL - 6.92(H) 10.65(H)  Sodium 135 - 145 mmol/L 139 135 132(L)  Potassium 3.5 - 5.1 mmol/L 4.6 3.9 4.7  Chloride 98 - 111 mmol/L - 94(L) 95(L)  CO2 22 - 32 mmol/L - 26 22  Calcium 8.9 - 10.3 mg/dL - 8.9 8.5(L)  Total Protein 6.1 - 8.1 g/dL - - -  Total Bilirubin 0.2 - 1.2 mg/dL - - -  Alkaline Phos 38 - 126 U/L - - -  AST 10 - 35 U/L - - -  ALT 9 - 46 U/L - - -    Lipid Panel     Component Value Date/Time   CHOL 89 (L) 02/08/2019 0910   TRIG 113 02/08/2019 0910   HDL 37 (L) 02/08/2019 0910   CHOLHDL 2.4 02/08/2019 0910   CHOLHDL 2.4 10/09/2015 0951   VLDL 10 10/09/2015 0951   LDLCALC 31 02/08/2019 0910    CBC    Component Value Date/Time   WBC 5.6 04/03/2019 0549   RBC  2.99 (L) 04/03/2019 0549   HGB 12.6 (L) 04/30/2019 0748   HCT 37.0 (L) 04/30/2019 0748   PLT 102 (L) 04/03/2019 0549   MCV 93.6 04/03/2019 0549   MCV 85.0 02/27/2015 1458   MCH 32.4 04/03/2019 0549   MCHC 34.6 04/03/2019 0549   RDW 15.1 04/03/2019 0549   LYMPHSABS 1.8 07/21/2017 2051   MONOABS 0.4 07/21/2017 2051   EOSABS 0.4 07/21/2017 2051   BASOSABS 0.0 07/21/2017 2051    Lab Results  Component Value Date   HGBA1C 7.0 05/03/2019    Assessment & Plan:   1. Hyperlipidemia associated with type 2 diabetes mellitus ([place]) Controlled with A1c of 7.0 Continue current regimen Hyperlipidemia is also controlled Continue statin, low-cholesterol diet Counseled on Diabetic diet, my plate method, [id number] minutes of moderate intensity exercise/week Blood sugar logs with fasting goals of 80-120 mg/dl, random of less than 180 and in the event of sugars less than 60 mg/dl or greater than 400 mg/dl encouraged to notify the clinic. Advised on the need for annual eye exams, annual foot exams, Pneumonia vaccine. - Glucose (CBG) - HgB A1c  2. Dermatitis Likely due to allergic reaction with tape Advised to apply moisturizer - triamcinolone cream (KENALOG) 0.1 %; Apply 1 application topically 2 (two) times daily.  Dispense: 45 g; Refill: 1  3. Type 2 diabetes mellitus with chronic kidney disease on chronic dialysis, with long-term current use of insulin ([place]) Continue hemodialysis as per protocol Currently followed by transplant clinic at [facility]  4. Other male erectile dysfunction Uncontrolled Unable to afford Viagra We will try Cialis - tadalafil (CIALIS) 10 MG tablet; Take 1 tablet (10 mg total) by mouth every other day as needed for erectile dysfunction.  Dispense: 10 tablet; Refill: 1  5. Essential hypertension Uncontrolled with labile blood pressures Previously on amlodipine and Lasix which was discontinued during hospitalization due to hypotension We will hold off on reinitiating his  antihypertensives and will defer to nephrology given labile blood pressures - Misc. Devices MISC; Blood Pressure monitor. Dx- Hypertension.  Dispense: 1 each; Refill: 0    Meds ordered this encounter  Medications  . triamcinolone cream (KENALOG) 0.1 %    Sig: Apply 1 application topically 2 (two) times daily.    Dispense:  45 g    Refill:  1  . Misc. Devices MISC    Sig: Blood Pressure monitor. Dx- Hypertension.    Dispense:  1 each    Refill:  0  . tadalafil (CIALIS) 10 MG tablet    Sig: Take 1 tablet (10 mg total) by mouth every other day as needed for erectile dysfunction.    Dispense:  10 tablet    Refill:  1    Follow-up: Return in about 3 months (around 08/01/2019) for medical conditions - virtual.       Charlott Rakes, MD, FAAFP. [facility] and [place], [place]   05/03/2019, 11:21 AM

## 2019-05-03 NOTE — Patient Instructions (Signed)
Erectile Dysfunction Erectile dysfunction (ED) is the inability to get or keep an erection in order to have sexual intercourse. Erectile dysfunction may include:  Inability to get an erection.  Lack of enough hardness of the erection to allow penetration.  Loss of the erection before sex is finished. What are the causes? This condition may be caused by:  Certain medicines, such as: ? Pain relievers. ? Antihistamines. ? Antidepressants. ? Blood pressure medicines. ? Water pills (diuretics). ? Ulcer medicines. ? Muscle relaxants. ? Drugs.  Excessive drinking.  Psychological causes, such as: ? Anxiety. ? Depression. ? Sadness. ? Exhaustion. ? Performance fear. ? Stress.  Physical causes, such as: ? Artery problems. This may include diabetes, smoking, liver disease, or atherosclerosis. ? High blood pressure. ? Hormonal problems, such as low testosterone. ? Obesity. ? Nerve problems. This may include back or pelvic injuries, diabetes mellitus, multiple sclerosis, or Parkinson disease. What are the signs or symptoms? Symptoms of this condition include:  Inability to get an erection.  Lack of enough hardness of the erection to allow penetration.  Loss of the erection before sex is finished.  Normal erections at some times, but with frequent unsatisfactory episodes.  Low sexual satisfaction in either partner due to erection problems.  A curved penis occurring with erection. The curve may cause pain or the penis may be too curved to allow for intercourse.  Never having nighttime erections. How is this diagnosed? This condition is often diagnosed by:  Performing a physical exam to find other diseases or specific problems with the penis.  Asking you detailed questions about the problem.  Performing blood tests to check for diabetes mellitus or to measure hormone levels.  Performing other tests to check for underlying health conditions.  Performing an ultrasound  exam to check for scarring.  Performing a test to check blood flow to the penis.  Doing a sleep study at home to measure nighttime erections. How is this treated? This condition may be treated by:  Medicine taken by mouth to help you achieve an erection (oral medicine).  Hormone replacement therapy to replace low testosterone levels.  Medicine that is injected into the penis. Your health care provider may instruct you how to give yourself these injections at home.  Vacuum pump. This is a pump with a ring on it. The pump and ring are placed on the penis and used to create pressure that helps the penis become erect.  Penile implant surgery. In this procedure, you may receive: ? An inflatable implant. This consists of cylinders, a pump, and a reservoir. The cylinders can be inflated with a fluid that helps to create an erection, and they can be deflated after intercourse. ? A semi-rigid implant. This consists of two silicone rubber rods. The rods provide some rigidity. They are also flexible, so the penis can both curve downward in its normal position and become straight for sexual intercourse.  Blood vessel surgery, to improve blood flow to the penis. During this procedure, a blood vessel from a different part of the body is placed into the penis to allow blood to flow around (bypass) damaged or blocked blood vessels.  Lifestyle changes, such as exercising more, losing weight, and quitting smoking. Follow these instructions at home: Medicines   Take over-the-counter and prescription medicines only as told by your health care provider. Do not increase the dosage without first discussing it with your health care provider.  If you are using self-injections, perform injections as directed by your   health care provider. Make sure to avoid any veins that are on the surface of the penis. After giving an injection, apply pressure to the injection site for 5 minutes. General instructions   Exercise regularly, as directed by your health care provider. Work with your health care provider to lose weight, if needed.  Do not use any products that contain nicotine or tobacco, such as cigarettes and e-cigarettes. If you need help quitting, ask your health care provider.  Before using a vacuum pump, read the instructions that come with the pump and discuss any questions with your health care provider.  Keep all follow-up visits as told by your health care provider. This is important. Contact a health care provider if:  You feel nauseous.  You vomit. Get help right away if:  You are taking oral or injectable medicines and you have an erection that lasts longer than 4 hours. If your health care provider is unavailable, go to the nearest emergency room for evaluation. An erection that lasts much longer than 4 hours can result in permanent damage to your penis.  You have severe pain in your groin or abdomen.  You develop redness or severe swelling of your penis.  You have redness spreading up into your groin or lower abdomen.  You are unable to urinate.  You experience chest pain or a rapid heart beat (palpitations) after taking oral medicines. Summary  Erectile dysfunction (ED) is the inability to get or keep an erection during sexual intercourse. This problem can usually be treated successfully.  This condition is diagnosed based on a physical exam, your symptoms, and tests to determine the cause. Treatment varies depending on the cause, and may include medicines, hormone therapy, surgery, or vacuum pump.  You may need follow-up visits to make sure that you are using your medicines or devices correctly.  Get help right away if you are taking or injecting medicines and you have an erection that lasts longer than 4 hours. This information is not intended to replace advice given to you by your health care provider. Make sure you discuss any questions you have with your health care  provider. Document Released: 05/03/2000 Document Revised: 04/18/2017 Document Reviewed: 05/22/2016 Elsevier Patient Education  2020 Elsevier Inc.  

## 2019-05-11 ENCOUNTER — Ambulatory Visit: Payer: Medicare Other | Admitting: Cardiovascular Disease

## 2019-05-23 DIAGNOSIS — Z992 Dependence on renal dialysis: Secondary | ICD-10-CM | POA: Diagnosis not present

## 2019-05-23 DIAGNOSIS — N186 End stage renal disease: Secondary | ICD-10-CM | POA: Diagnosis not present

## 2019-05-23 DIAGNOSIS — N2581 Secondary hyperparathyroidism of renal origin: Secondary | ICD-10-CM | POA: Diagnosis not present

## 2019-05-24 DIAGNOSIS — E113512 Type 2 diabetes mellitus with proliferative diabetic retinopathy with macular edema, left eye: Secondary | ICD-10-CM | POA: Diagnosis not present

## 2019-05-24 MED FILL — CARVEDILOL 6.25 MG TABLET: 6.25 | 30 days supply | Qty: 60 | Fill #4

## 2019-05-24 MED FILL — ATORVASTATIN CALCIUM 40 MG: 40 | 90 days supply | Qty: 90 | Fill #1

## 2019-05-25 DIAGNOSIS — N2581 Secondary hyperparathyroidism of renal origin: Secondary | ICD-10-CM | POA: Diagnosis not present

## 2019-05-25 DIAGNOSIS — N186 End stage renal disease: Secondary | ICD-10-CM | POA: Diagnosis not present

## 2019-05-25 DIAGNOSIS — Z992 Dependence on renal dialysis: Secondary | ICD-10-CM | POA: Diagnosis not present

## 2019-05-27 ENCOUNTER — Ambulatory Visit: Payer: Medicare Other | Admitting: Vascular Surgery

## 2019-05-27 ENCOUNTER — Other Ambulatory Visit: Payer: Self-pay

## 2019-05-27 ENCOUNTER — Ambulatory Visit (INDEPENDENT_AMBULATORY_CARE_PROVIDER_SITE_OTHER): Payer: Medicare Other | Admitting: Vascular Surgery

## 2019-05-27 ENCOUNTER — Encounter: Payer: Self-pay | Admitting: Vascular Surgery

## 2019-05-27 VITALS — BP 175/71 | HR 60 | Temp 97.5°F | Resp 20 | Ht 71.0 in | Wt 160.3 lb

## 2019-05-27 DIAGNOSIS — N186 End stage renal disease: Secondary | ICD-10-CM

## 2019-05-27 DIAGNOSIS — Z992 Dependence on renal dialysis: Secondary | ICD-10-CM | POA: Diagnosis not present

## 2019-05-27 DIAGNOSIS — N2581 Secondary hyperparathyroidism of renal origin: Secondary | ICD-10-CM | POA: Diagnosis not present

## 2019-05-27 NOTE — Progress Notes (Signed)
Patient is a 59 year old male who returns for follow-up today.  He recently had a tunneled dialysis catheter placed to allow his left arm AV access to heal.  He has had multiple skin erosions and abrasions over the graft.      Vitals:   05/27/19 0812  BP: (!) 175/71  Pulse: 60  Resp: 20  Temp: (!) 97.5 F (36.4 C)  SpO2: 100%  Weight: 160 lb 4.8 oz (72.7 kg)  Height: 5\' 11"  (1.803 m)    Left upper extremity has resolution of erythema and complete reepithelialization of the skin over the graft    Assessment/plan  The patient would like to have 2 more weeks of allowing the arm to heal prior to cannulation.  He will follow-up with me on an as-needed basis.  He dialyzes at [facility].  I will try to send a copy of my note today to them to give 2 more weeks prior to cannulating.  Ruta Hinds, MD [facility] Office: [phone]

## 2019-05-29 DIAGNOSIS — N2581 Secondary hyperparathyroidism of renal origin: Secondary | ICD-10-CM | POA: Diagnosis not present

## 2019-05-29 DIAGNOSIS — Z992 Dependence on renal dialysis: Secondary | ICD-10-CM | POA: Diagnosis not present

## 2019-05-29 DIAGNOSIS — N186 End stage renal disease: Secondary | ICD-10-CM | POA: Diagnosis not present

## 2019-06-01 DIAGNOSIS — N2581 Secondary hyperparathyroidism of renal origin: Secondary | ICD-10-CM | POA: Diagnosis not present

## 2019-06-01 DIAGNOSIS — Z992 Dependence on renal dialysis: Secondary | ICD-10-CM | POA: Diagnosis not present

## 2019-06-01 DIAGNOSIS — N186 End stage renal disease: Secondary | ICD-10-CM | POA: Diagnosis not present

## 2019-06-03 DIAGNOSIS — Z992 Dependence on renal dialysis: Secondary | ICD-10-CM | POA: Diagnosis not present

## 2019-06-03 DIAGNOSIS — N2581 Secondary hyperparathyroidism of renal origin: Secondary | ICD-10-CM | POA: Diagnosis not present

## 2019-06-03 DIAGNOSIS — N186 End stage renal disease: Secondary | ICD-10-CM | POA: Diagnosis not present

## 2019-06-05 DIAGNOSIS — N186 End stage renal disease: Secondary | ICD-10-CM | POA: Diagnosis not present

## 2019-06-05 DIAGNOSIS — Z992 Dependence on renal dialysis: Secondary | ICD-10-CM | POA: Diagnosis not present

## 2019-06-05 DIAGNOSIS — N2581 Secondary hyperparathyroidism of renal origin: Secondary | ICD-10-CM | POA: Diagnosis not present

## 2019-06-08 DIAGNOSIS — N2581 Secondary hyperparathyroidism of renal origin: Secondary | ICD-10-CM | POA: Diagnosis not present

## 2019-06-08 DIAGNOSIS — N186 End stage renal disease: Secondary | ICD-10-CM | POA: Diagnosis not present

## 2019-06-08 DIAGNOSIS — Z992 Dependence on renal dialysis: Secondary | ICD-10-CM | POA: Diagnosis not present

## 2019-06-09 MED FILL — LANTUS 100 UNITS/ML VIAL: 100 | 28 days supply | Qty: 10 | Fill #2

## 2019-06-10 DIAGNOSIS — N2581 Secondary hyperparathyroidism of renal origin: Secondary | ICD-10-CM | POA: Diagnosis not present

## 2019-06-10 DIAGNOSIS — N186 End stage renal disease: Secondary | ICD-10-CM | POA: Diagnosis not present

## 2019-06-10 DIAGNOSIS — Z992 Dependence on renal dialysis: Secondary | ICD-10-CM | POA: Diagnosis not present

## 2019-06-12 DIAGNOSIS — Z992 Dependence on renal dialysis: Secondary | ICD-10-CM | POA: Diagnosis not present

## 2019-06-12 DIAGNOSIS — N2581 Secondary hyperparathyroidism of renal origin: Secondary | ICD-10-CM | POA: Diagnosis not present

## 2019-06-12 DIAGNOSIS — N186 End stage renal disease: Secondary | ICD-10-CM | POA: Diagnosis not present

## 2019-06-15 DIAGNOSIS — N2581 Secondary hyperparathyroidism of renal origin: Secondary | ICD-10-CM | POA: Diagnosis not present

## 2019-06-15 DIAGNOSIS — Z992 Dependence on renal dialysis: Secondary | ICD-10-CM | POA: Diagnosis not present

## 2019-06-15 DIAGNOSIS — N186 End stage renal disease: Secondary | ICD-10-CM | POA: Diagnosis not present

## 2019-06-17 DIAGNOSIS — N2581 Secondary hyperparathyroidism of renal origin: Secondary | ICD-10-CM | POA: Diagnosis not present

## 2019-06-17 DIAGNOSIS — N186 End stage renal disease: Secondary | ICD-10-CM | POA: Diagnosis not present

## 2019-06-17 DIAGNOSIS — Z992 Dependence on renal dialysis: Secondary | ICD-10-CM | POA: Diagnosis not present

## 2019-06-19 DIAGNOSIS — N186 End stage renal disease: Secondary | ICD-10-CM | POA: Diagnosis not present

## 2019-06-19 DIAGNOSIS — Z992 Dependence on renal dialysis: Secondary | ICD-10-CM | POA: Diagnosis not present

## 2019-06-19 DIAGNOSIS — N2581 Secondary hyperparathyroidism of renal origin: Secondary | ICD-10-CM | POA: Diagnosis not present

## 2019-06-20 DIAGNOSIS — E1122 Type 2 diabetes mellitus with diabetic chronic kidney disease: Secondary | ICD-10-CM | POA: Diagnosis not present

## 2019-06-20 DIAGNOSIS — N186 End stage renal disease: Secondary | ICD-10-CM | POA: Diagnosis not present

## 2019-06-20 DIAGNOSIS — Z992 Dependence on renal dialysis: Secondary | ICD-10-CM | POA: Diagnosis not present

## 2019-06-22 DIAGNOSIS — N186 End stage renal disease: Secondary | ICD-10-CM | POA: Diagnosis not present

## 2019-06-22 DIAGNOSIS — N2581 Secondary hyperparathyroidism of renal origin: Secondary | ICD-10-CM | POA: Diagnosis not present

## 2019-06-22 DIAGNOSIS — Z992 Dependence on renal dialysis: Secondary | ICD-10-CM | POA: Diagnosis not present

## 2019-06-24 DIAGNOSIS — N186 End stage renal disease: Secondary | ICD-10-CM | POA: Diagnosis not present

## 2019-06-24 DIAGNOSIS — Z992 Dependence on renal dialysis: Secondary | ICD-10-CM | POA: Diagnosis not present

## 2019-06-24 DIAGNOSIS — N2581 Secondary hyperparathyroidism of renal origin: Secondary | ICD-10-CM | POA: Diagnosis not present

## 2019-06-25 DIAGNOSIS — Z452 Encounter for adjustment and management of vascular access device: Secondary | ICD-10-CM | POA: Diagnosis not present

## 2019-06-26 DIAGNOSIS — Z992 Dependence on renal dialysis: Secondary | ICD-10-CM | POA: Diagnosis not present

## 2019-06-26 DIAGNOSIS — N2581 Secondary hyperparathyroidism of renal origin: Secondary | ICD-10-CM | POA: Diagnosis not present

## 2019-06-26 DIAGNOSIS — N186 End stage renal disease: Secondary | ICD-10-CM | POA: Diagnosis not present

## 2019-06-29 DIAGNOSIS — Z992 Dependence on renal dialysis: Secondary | ICD-10-CM | POA: Diagnosis not present

## 2019-06-29 DIAGNOSIS — N2581 Secondary hyperparathyroidism of renal origin: Secondary | ICD-10-CM | POA: Diagnosis not present

## 2019-06-29 DIAGNOSIS — N186 End stage renal disease: Secondary | ICD-10-CM | POA: Diagnosis not present

## 2019-06-30 ENCOUNTER — Encounter: Payer: Self-pay | Admitting: Cardiovascular Disease

## 2019-06-30 ENCOUNTER — Telehealth: Payer: Self-pay | Admitting: *Deleted

## 2019-06-30 ENCOUNTER — Ambulatory Visit (INDEPENDENT_AMBULATORY_CARE_PROVIDER_SITE_OTHER): Payer: Medicare HMO | Admitting: Cardiovascular Disease

## 2019-06-30 ENCOUNTER — Other Ambulatory Visit: Payer: Self-pay

## 2019-06-30 ENCOUNTER — Other Ambulatory Visit: Payer: Self-pay | Admitting: *Deleted

## 2019-06-30 VITALS — BP 130/62 | HR 57 | Ht 71.0 in | Wt 160.2 lb

## 2019-06-30 DIAGNOSIS — I2510 Atherosclerotic heart disease of native coronary artery without angina pectoris: Secondary | ICD-10-CM

## 2019-06-30 DIAGNOSIS — E785 Hyperlipidemia, unspecified: Secondary | ICD-10-CM | POA: Diagnosis not present

## 2019-06-30 DIAGNOSIS — E118 Type 2 diabetes mellitus with unspecified complications: Secondary | ICD-10-CM | POA: Diagnosis not present

## 2019-06-30 DIAGNOSIS — I10 Essential (primary) hypertension: Secondary | ICD-10-CM | POA: Diagnosis not present

## 2019-06-30 DIAGNOSIS — Z01818 Encounter for other preprocedural examination: Secondary | ICD-10-CM

## 2019-06-30 DIAGNOSIS — N186 End stage renal disease: Secondary | ICD-10-CM

## 2019-06-30 NOTE — Patient Instructions (Signed)
Medication Instructions:  Your physician recommends that you continue on your current medications as directed. Please refer to the Current Medication list given to you today.  *If you need a refill on your cardiac medications before your next appointment, please call your pharmacy*  Lab Work: none If you have labs (blood work) drawn today and your tests are completely normal, you will receive your results only by: Marland Kitchen MyChart Message (if you have MyChart) OR . A paper copy in the mail If you have any lab test that is abnormal or we need to change your treatment, we will call you to review the results.  Testing/Procedures: none  Follow-Up: At [facility], you and your health needs are our priority.  As part of our continuing mission to provide you with exceptional heart care, we have created designated Provider Care Teams.  These Care Teams include your primary Cardiologist (physician) and Advanced Practice Providers (APPs -  Physician Assistants and Nurse Practitioners) who all work together to provide you with the care you need, when you need it.  Your next appointment:   12 month(s)  The format for your next appointment:   In Person  Provider:   Sanda Klein, MD

## 2019-06-30 NOTE — Progress Notes (Addendum)
Cardiology Office Note:    Date:  06/30/2019   ID:  Russell Price, DOB May 27, 1960, MRN [id number]  PCP:  Charlott Rakes, MD  Cardiologist:  Sanda Klein, MD     Referring MD: Charlott Rakes, MD   Chief Complaint  Patient presents with  . Coronary Artery Disease    History of Present Illness:    Russell Price is a 59 y.o. male with a hx of Coronary artery disease (stenoses in small caliber RCA and OM 3), diabetes mellitus requiring insulin, hypertension, CK D stage V approaching hemodialysis, mature AV fistula left arm.  Since I last saw him he started hemodialysis 3 days a week.  Its been about a year and a half now.  He tolerates hemodialysis well without any angina during treatment.  He rarely needs to have dialysis interrupted due to low blood pressure.  He denies problems with shortness of breath at rest or with activity.  He takes care of all his own household activities without cardiac symptoms.  The patient specifically denies any chest pain at rest exertion, dyspnea at rest or with exertion, orthopnea, paroxysmal nocturnal dyspnea, syncope, palpitations, focal neurological deficits, intermittent claudication, lower extremity edema, unexplained weight gain, cough, hemoptysis or wheezing.  His nephrologist is Dr. Rolan Lipa and his.  His current undergoing work-up for kidney transplant at [facility].  June 23, 2019.  This showed normal left ventricular regional wall motion and borderline LVEF of 50-55% with reduced LV longitudinal strain of -11.6%.  The mitral inflow was described as pseudonormal in the left atrium was mildly dilated.  There were no serious valvular abnormalities.  He tells me he is scheduled to have another test next week for his heart but does not know exactly what this was.  His cholesterol is very low, on statin.  His glycemic control is adequate with a hemoglobin A1c of 7%.  Diagnostic Diagram 03/04/2016   Mid  RCA lesion, 65 %stenosed. Dist RCA lesion, 85 %stenosed. Relatively small caliber vessel. Smaller diameter than 5 French catheter  Prox LAD lesion, 45 %stenosed. Mid LAD lesion, 35 %stenosed.  3rd Mrg lesion, 70 %stenosed.  LV end diastolic pressure is moderately elevated.    Patient does have severe 2 vessel disease involving the RCA and OM 3. Both vessels are relatively small in caliber and not good PCI targets       Past Medical History:  Diagnosis Date  . Back pain 03/12/2018  . CAD (coronary artery disease)    a. underwent cath in 02/2016 after an abnormal nuc which showed 2V CAD in RCA and OM3 with no good targets for PCI. Medical managment recommended.   . Chronic kidney disease   . Diabetes mellitus without complication ([place])    type II - followed by Dr Chalmers Cater   . Difficult intubation    ER intubation 2016  . ESRD (end stage renal disease) ([place])    Tues, Th, Sat [place]  . Fever 03/12/2018  . Glaucoma   . Hemodialysis patient [facility])    has been on dialysis since approx 10/2016   . Hypertension    followed by [facility]  . Stroke [facility])    patient denies on 02/19/2017     Past Surgical History:  Procedure Laterality Date  . A/V FISTULAGRAM Left 11/27/2016   Procedure: A/V Fistulagram;  Surgeon: Waynetta Sandy, MD;  Location: [place] [facility];  Service: Cardiovascular;  Laterality: Left;  . A/V FISTULAGRAM Left 05/07/2017  Procedure: A/V FISTULAGRAM;  Surgeon: Waynetta Sandy, MD;  Location: [place] [facility];  Service: Cardiovascular;  Laterality: Left;  . A/V FISTULAGRAM Left 07/14/2017   Procedure: A/V FISTULAGRAM;  Surgeon: Waynetta Sandy, MD;  Location: [place] [facility];  Service: Cardiovascular;  Laterality: Left;  . AV FISTULA PLACEMENT Left 03/12/2016   Procedure: Left Arm ARTERIOVENOUS (AV) FISTULA CREATION;  Surgeon: Waynetta Sandy, MD;  Location[facility];  Service: Vascular;  Laterality:  Left;  . AV FISTULA PLACEMENT Left 07/18/2017   Procedure: INSERTION OF ARTERIOVENOUS (AV) GORE-TEX GRAFT ARM LEFT UPPER ARM;  Surgeon: Conrad [place], MD;  Location: [place];  Service: Vascular;  Laterality: Left;  . BASCILIC VEIN TRANSPOSITION Left 05/14/2016   Procedure: SECOND STAGE LEFT BASILIC VEIN TRANSPOSITION;  Surgeon: Waynetta Sandy, MD;  Location: [place];  Service: Vascular;  Laterality: Left;  . BRONCHOSCOPY  02/14/2015   for pulm hemorrhage  . CARDIAC CATHETERIZATION N/A 03/04/2016   Procedure: Left Heart Cath and Coronary Angiography;  Surgeon: Leonie Man, MD;  Location: [place] [facility];  Service: Cardiovascular;  Laterality: N/A;  . COLONOSCOPY WITH PROPOFOL N/A 04/07/2017   Procedure: COLONOSCOPY WITH PROPOFOL;  Surgeon: Ronnette Juniper, MD;  Location: [place];  Service: Gastroenterology;  Laterality: N/A;  . DIALYSIS/PERMA CATHETER INSERTION Right 04/30/2019   Procedure: DIALYSIS/PERMA CATHETER INSERTION;  Surgeon: Elam Dutch, MD;  Location: [place] [facility];  Service: Cardiovascular;  Laterality: Right;  . EYE SURGERY Left   . INSERTION OF DIALYSIS CATHETER Right 11/08/2016   Procedure: INSERTION OF DIALYSIS CATHETER;  Surgeon: Rosetta Posner, MD;  Location: [place];  Service: Vascular;  Laterality: Right;  . IR PARACENTESIS  11/13/2016  . LIGATION OF ARTERIOVENOUS  FISTULA Left 07/18/2017   Procedure: LIGATION OF ARTERIOVENOUS  FISTULA;  Surgeon: Conrad [place], MD;  Location: [place];  Service: Vascular;  Laterality: Left;  . PERIPHERAL VASCULAR BALLOON ANGIOPLASTY Left 11/27/2016   Procedure: Peripheral Vascular Balloon Angioplasty;  Surgeon: Waynetta Sandy, MD;  Location: [place] [facility];  Service: Cardiovascular;  Laterality: Left;  ARM FISTULA  . PERIPHERAL VASCULAR BALLOON ANGIOPLASTY Left 05/07/2017   Procedure: PERIPHERAL VASCULAR BALLOON ANGIOPLASTY;  Surgeon: Waynetta Sandy, MD;  Location: [place] [facility];  Service:  Cardiovascular;  Laterality: Left;    Current Medications: Current Meds  Medication Sig  . ACCU-CHEK SOFTCLIX LANCETS lancets Use as instructed 3 times daily before meals and at bedtime. E11.9  . aspirin EC 81 MG tablet Take 81 mg by mouth daily.  Marland Kitchen atorvastatin (LIPITOR) 40 MG tablet Take 1 tablet (40 mg total) by mouth daily at 6 PM. (Patient taking differently: Take 40 mg by mouth at bedtime. )  . b complex-vitamin c-folic acid (NEPHRO-VITE) 0.8 MG TABS tablet Take 1 tablet by mouth at bedtime.  . Blood Glucose Monitoring Suppl (ACCU-CHEK AVIVA) device Use as instructed 3 times daily before meals and at bedtime. E11.9  . carvedilol (COREG) 6.25 MG tablet Take 6.25 mg by mouth 2 (two) times daily.  Marland Kitchen gabapentin (NEURONTIN) 300 MG capsule Take 1 capsule (300 mg total) by mouth daily.  Marland Kitchen glucose blood (ACCU-CHEK AVIVA PLUS) test strip Use 4 times daily before meals and at bedtime. E11.9  . insulin aspart (NOVOLOG) 100 UNIT/ML injection 0-12 units 3 times daily before meals as well as per sliding scale (Patient taking differently: Inject 0-12 Units into the skin 3 (three) times daily with meals. per sliding scale)  .  insulin glargine (LANTUS) 100 UNIT/ML injection Inject 0.1 mLs (10 Units total) into the skin 2 (two) times daily.  Elmore Guise Devices (ACCU-CHEK SOFTCLIX) lancets Use as instructed 3 times daily before meals and at bedtime. E11.9  . Misc. Devices MISC Tub transfer bench. Dx: acute discitis and low back pain  . Misc. Devices MISC Blood Pressure monitor. Dx- Hypertension.  Marland Kitchen RENAGEL 800 MG tablet Take 800-2,400 mg by mouth See admin instructions. Take 2400 mg by mouth 3 times daily with meals and take 800 mg by mouth twice daily with snacks  . tadalafil (CIALIS) 10 MG tablet Take 1 tablet (10 mg total) by mouth every other day as needed for erectile dysfunction.  . timolol (BETIMOL) 0.5 % ophthalmic solution Place 1 drop into the left eye 2 (two) times daily.  Marland Kitchen triamcinolone cream  (KENALOG) 0.1 % Apply 1 application topically 2 (two) times daily.     Allergies:   Other   Social History   Socioeconomic History  . Marital status: Married    Spouse name: Not on file  . Number of children: Not on file  . Years of education: Not on file  . Highest education level: Not on file  Occupational History  . Not on file  Tobacco Use  . Smoking status: Former Smoker    Packs/day: 0.00    Years: 4.00    Pack years: 0.00    Quit date: 02/23/1983    Years since quitting: 36.3  . Smokeless tobacco: Never Used  Substance and Sexual Activity  . Alcohol use: No  . Drug use: No  . Sexual activity: Not on file  Other Topics Concern  . Not on file  Social History Narrative  . Not on file   Social Determinants of Health   Financial Resource Strain:   . Difficulty of Paying Living Expenses: Not on file  Food Insecurity:   . Worried About Charity fundraiser in the Last Year: Not on file  . Ran Out of Food in the Last Year: Not on file  Transportation Needs:   . Lack of Transportation (Medical): Not on file  . Lack of Transportation (Non-Medical): Not on file  Physical Activity:   . Days of Exercise per Week: Not on file  . Minutes of Exercise per Session: Not on file  Stress:   . Feeling of Stress : Not on file  Social Connections:   . Frequency of Communication with Friends and Family: Not on file  . Frequency of Social Gatherings with Friends and Family: Not on file  . Attends Religious Services: Not on file  . Active Member of Clubs or Organizations: Not on file  . Attends Archivist Meetings: Not on file  . Marital Status: Not on file     family history includes Diabetes in his mother. ROS:   Please see the history of present illness.    All other systems are reviewed and are negative.   EKGs/Labs/Other Studies Reviewed:    EKG:  EKG is  ordered today.  Shows sinus rhythm with prominent changes of left ventricular hypertrophy and secondary ST  segment depression T wave inversion in the inferior and lateral leads.  No change from previous tracing.  ECHO [facility] 06/23/2019 The left ventricular size is normal. Left ventricular systolic function is low normal with LV ejection fraction = 50-55%. LV Global L Strain =-11.6%. No segmental wall motion abnormalities seen in the left ventricle The right ventricle is normal in  size and function. The left atrium is mildly dilated. There is no significant valvular stenosis or regurgitation. The ascending aorta is normal size. IVC size was normal. There is no pericardial effusion.  There is no comparison study available  Recent Labs: 04/03/2019: BUN 38; Creatinine, Ser 6.92; Platelets 102 04/30/2019: Hemoglobin 12.6; Potassium 4.6; Sodium 139   Recent Lipid Panel    Component Value Date/Time   CHOL 89 (L) 02/08/2019 0910   TRIG 113 02/08/2019 0910   HDL 37 (L) 02/08/2019 0910   CHOLHDL 2.4 02/08/2019 0910   CHOLHDL 2.4 10/09/2015 0951   VLDL 10 10/09/2015 0951   LDLCALC 31 02/08/2019 0910    Physical Exam:    VS:  BP 130/62   Pulse (!) 57   Ht 5\' 11"  (1.803 m)   Wt 160 lb 3.2 oz (72.7 kg)   BMI 22.34 kg/m     Wt Readings from Last 3 Encounters:  06/30/19 160 lb 3.2 oz (72.7 kg)  05/27/19 160 lb 4.8 oz (72.7 kg)  05/03/19 161 lb (73 kg)       General: Alert, oriented x3, no distress, slender. Head: no evidence of trauma, PERRL, opacified right cornea, no exophtalmos or lid lag, no myxedema, no xanthelasma; normal ears, nose and oropharynx Neck: normal jugular venous pulsations and no hepatojugular reflux; brisk carotid pulses without delay and no carotid bruits Chest: clear to auscultation, no signs of consolidation by percussion or palpation, normal fremitus, symmetrical and full respiratory excursions Cardiovascular: normal position and quality of the apical impulse, regular rhythm, normal first and second heart sounds, no murmurs, rubs or gallops.  Large fistula with  excellent thrill and bruit left arm. Abdomen: no tenderness or distention, no masses by palpation, no abnormal pulsatility or arterial bruits, normal bowel sounds, no hepatosplenomegaly Extremities: no clubbing, cyanosis or edema; 2+ radial, ulnar and brachial pulses bilaterally; 2+ right femoral, posterior tibial and dorsalis pedis pulses; 2+ left femoral, posterior tibial and dorsalis pedis pulses; no subclavian or femoral bruits Neurological: grossly nonfocal Psych: Normal mood and affect'  ASSESSMENT:    1. Coronary artery disease involving native coronary artery of native heart without angina pectoris   2. Essential hypertension   3. DM (diabetes mellitus) with complications ([place])   4. Dyslipidemia   5. ESRD (end stage renal disease) ([place])    PLAN:    In order of problems listed above:  1. CAD: He has asymptomatic coronary artery disease involving relatively small vessels that are not good targets for percutaneous revascularization.  He is on aspirin statin and beta-blocker.  From a symptom point of view he does not need additional work-up at this time, but it is probable that the pretransplant service will request a follow-up nuclear perfusion study.  We will be happy to perform this in our facility if preferred. 2. HTN: Excellent blood pressure control 3. DM: Fair control with hemoglobin A1c 7%. 4. HLP: Excellent lipid profile on current statin. 5. ESRD on HD: Undergoing transplant evaluation.  He has stable CAD and well-controlled risk factors.  I think that he is an excellent candidate for renal transplant.  Would be glad to perform a Lexiscan Myoview study in our office if requested by the [facility] transplant team.   Medication Adjustments/Labs and Tests Ordered: Current medicines are reviewed at length with the patient today.  Concerns regarding medicines are outlined above. Labs and tests ordered and medication changes are outlined in the patient instructions below:   Patient Instructions  Medication  Instructions:  Your physician recommends that you continue on your current medications as directed. Please refer to the Current Medication list given to you today.  *If you need a refill on your cardiac medications before your next appointment, please call your pharmacy*  Lab Work: none If you have labs (blood work) drawn today and your tests are completely normal, you will receive your results only by: Marland Kitchen MyChart Message (if you have MyChart) OR . A paper copy in the mail If you have any lab test that is abnormal or we need to change your treatment, we will call you to review the results.  Testing/Procedures: none  Follow-Up: At [facility], you and your health needs are our priority.  As part of our continuing mission to provide you with exceptional heart care, we have created designated Provider Care Teams.  These Care Teams include your primary Cardiologist (physician) and Advanced Practice Providers (APPs -  Physician Assistants and Nurse Practitioners) who all work together to provide you with the care you need, when you need it.  Your next appointment:   12 month(s)  The format for your next appointment:   In Person  Provider:   Sanda Klein, MD      Signed, Sanda Klein, MD  06/30/2019 8:59 AM    [facility]   ADDENDUM:  Discussed with the transplant coordinator at [facility]. The patient will require stress testing, carotid duplex ultrasonography and iliac artery ultrasound prior to renal transplantation and prefers to have those test done locally. We will schedule those procedures in our office in [place] and then send the results to the transplant team at [facility] (contact RN Gilda Crease).

## 2019-06-30 NOTE — Telephone Encounter (Signed)
The patient has been called and notified that per the transplant team at [facility], he will need to have a Lexi Myoview, Carotid Duplex and Aorta/Iliac Duplex. He has verbalized his understanding and scheduling has been notified.   For future reference, the contact person for the patient at [facility] is Gilda Crease (tranplant team).

## 2019-07-01 DIAGNOSIS — N186 End stage renal disease: Secondary | ICD-10-CM | POA: Diagnosis not present

## 2019-07-01 DIAGNOSIS — N2581 Secondary hyperparathyroidism of renal origin: Secondary | ICD-10-CM | POA: Diagnosis not present

## 2019-07-01 DIAGNOSIS — Z992 Dependence on renal dialysis: Secondary | ICD-10-CM | POA: Diagnosis not present

## 2019-07-03 DIAGNOSIS — Z992 Dependence on renal dialysis: Secondary | ICD-10-CM | POA: Diagnosis not present

## 2019-07-03 DIAGNOSIS — N2581 Secondary hyperparathyroidism of renal origin: Secondary | ICD-10-CM | POA: Diagnosis not present

## 2019-07-03 DIAGNOSIS — N186 End stage renal disease: Secondary | ICD-10-CM | POA: Diagnosis not present

## 2019-07-06 DIAGNOSIS — N186 End stage renal disease: Secondary | ICD-10-CM | POA: Diagnosis not present

## 2019-07-06 DIAGNOSIS — Z992 Dependence on renal dialysis: Secondary | ICD-10-CM | POA: Diagnosis not present

## 2019-07-06 DIAGNOSIS — N2581 Secondary hyperparathyroidism of renal origin: Secondary | ICD-10-CM | POA: Diagnosis not present

## 2019-07-08 DIAGNOSIS — N186 End stage renal disease: Secondary | ICD-10-CM | POA: Diagnosis not present

## 2019-07-08 DIAGNOSIS — Z992 Dependence on renal dialysis: Secondary | ICD-10-CM | POA: Diagnosis not present

## 2019-07-08 DIAGNOSIS — N2581 Secondary hyperparathyroidism of renal origin: Secondary | ICD-10-CM | POA: Diagnosis not present

## 2019-07-10 DIAGNOSIS — N2581 Secondary hyperparathyroidism of renal origin: Secondary | ICD-10-CM | POA: Diagnosis not present

## 2019-07-10 DIAGNOSIS — Z992 Dependence on renal dialysis: Secondary | ICD-10-CM | POA: Diagnosis not present

## 2019-07-10 DIAGNOSIS — N186 End stage renal disease: Secondary | ICD-10-CM | POA: Diagnosis not present

## 2019-07-13 DIAGNOSIS — N2581 Secondary hyperparathyroidism of renal origin: Secondary | ICD-10-CM | POA: Diagnosis not present

## 2019-07-13 DIAGNOSIS — Z992 Dependence on renal dialysis: Secondary | ICD-10-CM | POA: Diagnosis not present

## 2019-07-13 DIAGNOSIS — N186 End stage renal disease: Secondary | ICD-10-CM | POA: Diagnosis not present

## 2019-07-15 DIAGNOSIS — Z992 Dependence on renal dialysis: Secondary | ICD-10-CM | POA: Diagnosis not present

## 2019-07-15 DIAGNOSIS — N186 End stage renal disease: Secondary | ICD-10-CM | POA: Diagnosis not present

## 2019-07-15 DIAGNOSIS — N2581 Secondary hyperparathyroidism of renal origin: Secondary | ICD-10-CM | POA: Diagnosis not present

## 2019-07-17 DIAGNOSIS — N186 End stage renal disease: Secondary | ICD-10-CM | POA: Diagnosis not present

## 2019-07-17 DIAGNOSIS — Z992 Dependence on renal dialysis: Secondary | ICD-10-CM | POA: Diagnosis not present

## 2019-07-17 DIAGNOSIS — N2581 Secondary hyperparathyroidism of renal origin: Secondary | ICD-10-CM | POA: Diagnosis not present

## 2019-07-18 DIAGNOSIS — Z992 Dependence on renal dialysis: Secondary | ICD-10-CM | POA: Diagnosis not present

## 2019-07-18 DIAGNOSIS — N186 End stage renal disease: Secondary | ICD-10-CM | POA: Diagnosis not present

## 2019-07-18 DIAGNOSIS — E1122 Type 2 diabetes mellitus with diabetic chronic kidney disease: Secondary | ICD-10-CM | POA: Diagnosis not present

## 2019-07-19 DIAGNOSIS — E113512 Type 2 diabetes mellitus with proliferative diabetic retinopathy with macular edema, left eye: Secondary | ICD-10-CM | POA: Diagnosis not present

## 2019-07-19 MED FILL — CARVEDILOL 6.25 MG TABLET: 6.25 | 30 days supply | Qty: 60 | Fill #5

## 2019-07-20 DIAGNOSIS — Z992 Dependence on renal dialysis: Secondary | ICD-10-CM | POA: Diagnosis not present

## 2019-07-20 DIAGNOSIS — N2581 Secondary hyperparathyroidism of renal origin: Secondary | ICD-10-CM | POA: Diagnosis not present

## 2019-07-20 DIAGNOSIS — N186 End stage renal disease: Secondary | ICD-10-CM | POA: Diagnosis not present

## 2019-07-21 ENCOUNTER — Telehealth (HOSPITAL_COMMUNITY): Payer: Self-pay

## 2019-07-21 NOTE — Telephone Encounter (Signed)
Encounter complete. 

## 2019-07-22 DIAGNOSIS — N2581 Secondary hyperparathyroidism of renal origin: Secondary | ICD-10-CM | POA: Diagnosis not present

## 2019-07-22 DIAGNOSIS — Z992 Dependence on renal dialysis: Secondary | ICD-10-CM | POA: Diagnosis not present

## 2019-07-22 DIAGNOSIS — N186 End stage renal disease: Secondary | ICD-10-CM | POA: Diagnosis not present

## 2019-07-23 ENCOUNTER — Ambulatory Visit (HOSPITAL_BASED_OUTPATIENT_CLINIC_OR_DEPARTMENT_OTHER)
Admission: RE | Admit: 2019-07-23 | Discharge: 2019-07-23 | Disposition: A | Discharge status: Home / Self Care | Payer: Medicare HMO | Source: Ambulatory Visit | Attending: Cardiovascular Disease | Admitting: Cardiovascular Disease

## 2019-07-23 ENCOUNTER — Ambulatory Visit (HOSPITAL_COMMUNITY)
Admission: RE | Admit: 2019-07-23 | Discharge: 2019-07-23 | Disposition: A | Discharge status: Home / Self Care | Payer: Medicare HMO | Source: Ambulatory Visit | Attending: Cardiovascular Disease | Admitting: Cardiovascular Disease

## 2019-07-23 ENCOUNTER — Other Ambulatory Visit: Payer: Self-pay

## 2019-07-23 ENCOUNTER — Encounter (HOSPITAL_COMMUNITY): Payer: Self-pay

## 2019-07-23 DIAGNOSIS — E1122 Type 2 diabetes mellitus with diabetic chronic kidney disease: Secondary | ICD-10-CM | POA: Diagnosis not present

## 2019-07-23 DIAGNOSIS — Z992 Dependence on renal dialysis: Secondary | ICD-10-CM | POA: Diagnosis not present

## 2019-07-23 DIAGNOSIS — Z01818 Encounter for other preprocedural examination: Secondary | ICD-10-CM

## 2019-07-23 DIAGNOSIS — R9439 Abnormal result of other cardiovascular function study: Secondary | ICD-10-CM | POA: Insufficient documentation

## 2019-07-23 DIAGNOSIS — I2510 Atherosclerotic heart disease of native coronary artery without angina pectoris: Secondary | ICD-10-CM | POA: Diagnosis not present

## 2019-07-23 DIAGNOSIS — I6523 Occlusion and stenosis of bilateral carotid arteries: Secondary | ICD-10-CM | POA: Diagnosis not present

## 2019-07-23 DIAGNOSIS — I129 Hypertensive chronic kidney disease with stage 1 through stage 4 chronic kidney disease, or unspecified chronic kidney disease: Secondary | ICD-10-CM | POA: Diagnosis not present

## 2019-07-23 DIAGNOSIS — Z01810 Encounter for preprocedural cardiovascular examination: Secondary | ICD-10-CM | POA: Diagnosis not present

## 2019-07-23 DIAGNOSIS — Z87891 Personal history of nicotine dependence: Secondary | ICD-10-CM | POA: Diagnosis not present

## 2019-07-23 DIAGNOSIS — N185 Chronic kidney disease, stage 5: Secondary | ICD-10-CM | POA: Diagnosis not present

## 2019-07-23 DIAGNOSIS — I70202 Unspecified atherosclerosis of native arteries of extremities, left leg: Secondary | ICD-10-CM | POA: Insufficient documentation

## 2019-07-23 MED ORDER — TECHNETIUM TC 99M TETROFOSMIN IV KIT
30.7000 | PACK | Freq: Once | INTRAVENOUS | Status: AC | PRN
  Administered 2019-07-23: 30.7 via INTRAVENOUS
  Filled 2019-07-23: qty 31

## 2019-07-23 MED ORDER — TECHNETIUM TC 99M TETROFOSMIN IV KIT
10.2000 | PACK | Freq: Once | INTRAVENOUS | Status: AC | PRN
  Administered 2019-07-23: 10.2 via INTRAVENOUS
  Filled 2019-07-23: qty 11

## 2019-07-23 MED ORDER — REGADENOSON 0.4 MG/5ML IV SOLN
0.4000 mg | Freq: Once | INTRAVENOUS | Status: AC
  Administered 2019-07-23: 0.4 mg via INTRAVENOUS

## 2019-07-23 MED FILL — GABAPENTIN 300 MG CAPSULE: 300 | 90 days supply | Qty: 90 | Fill #1

## 2019-07-24 DIAGNOSIS — Z992 Dependence on renal dialysis: Secondary | ICD-10-CM | POA: Diagnosis not present

## 2019-07-24 DIAGNOSIS — N186 End stage renal disease: Secondary | ICD-10-CM | POA: Diagnosis not present

## 2019-07-24 DIAGNOSIS — N2581 Secondary hyperparathyroidism of renal origin: Secondary | ICD-10-CM | POA: Diagnosis not present

## 2019-07-27 DIAGNOSIS — Z992 Dependence on renal dialysis: Secondary | ICD-10-CM | POA: Diagnosis not present

## 2019-07-27 DIAGNOSIS — N2581 Secondary hyperparathyroidism of renal origin: Secondary | ICD-10-CM | POA: Diagnosis not present

## 2019-07-27 DIAGNOSIS — N186 End stage renal disease: Secondary | ICD-10-CM | POA: Diagnosis not present

## 2019-07-27 LAB — MYOCARDIAL PERFUSION IMAGING
LV dias vol: 136 mL (ref 62–150)
LV sys vol: 73 mL
Peak HR: 71 {beats}/min
Rest HR: 69 {beats}/min
SDS: 3
SRS: 4
SSS: 5
TID: 0.91

## 2019-07-28 ENCOUNTER — Telehealth: Payer: Self-pay | Admitting: Cardiovascular Disease

## 2019-07-28 NOTE — Telephone Encounter (Signed)
Noted will let Dr Sallyanne Kuster know .Adonis Housekeeper

## 2019-07-28 NOTE — Telephone Encounter (Signed)
Dupree with [facility] called to advised that the did receive a referral for patient but it was to go to the transplant team. She wanted to advise that she has sent it to the transplant team.

## 2019-07-29 DIAGNOSIS — Z992 Dependence on renal dialysis: Secondary | ICD-10-CM | POA: Diagnosis not present

## 2019-07-29 DIAGNOSIS — N2581 Secondary hyperparathyroidism of renal origin: Secondary | ICD-10-CM | POA: Diagnosis not present

## 2019-07-29 DIAGNOSIS — N186 End stage renal disease: Secondary | ICD-10-CM | POA: Diagnosis not present

## 2019-07-31 DIAGNOSIS — Z992 Dependence on renal dialysis: Secondary | ICD-10-CM | POA: Diagnosis not present

## 2019-07-31 DIAGNOSIS — N2581 Secondary hyperparathyroidism of renal origin: Secondary | ICD-10-CM | POA: Diagnosis not present

## 2019-07-31 DIAGNOSIS — N186 End stage renal disease: Secondary | ICD-10-CM | POA: Diagnosis not present

## 2019-08-02 ENCOUNTER — Other Ambulatory Visit: Payer: Self-pay

## 2019-08-02 ENCOUNTER — Ambulatory Visit: Payer: Medicare Other | Admitting: Family Medicine

## 2019-08-02 ENCOUNTER — Ambulatory Visit: Payer: Medicare HMO | Attending: Family Medicine | Admitting: Physician Assistant

## 2019-08-02 VITALS — BP 155/80 | HR 62 | Temp 98.4°F | Resp 18 | Ht 71.0 in | Wt 163.0 lb

## 2019-08-02 DIAGNOSIS — I10 Essential (primary) hypertension: Secondary | ICD-10-CM

## 2019-08-02 DIAGNOSIS — E0841 Diabetes mellitus due to underlying condition with diabetic mononeuropathy: Secondary | ICD-10-CM

## 2019-08-02 DIAGNOSIS — E1169 Type 2 diabetes mellitus with other specified complication: Secondary | ICD-10-CM

## 2019-08-02 DIAGNOSIS — E1122 Type 2 diabetes mellitus with diabetic chronic kidney disease: Secondary | ICD-10-CM

## 2019-08-02 DIAGNOSIS — N186 End stage renal disease: Secondary | ICD-10-CM

## 2019-08-02 DIAGNOSIS — Z125 Encounter for screening for malignant neoplasm of prostate: Secondary | ICD-10-CM

## 2019-08-02 LAB — POCT GLYCOSYLATED HEMOGLOBIN (HGB A1C): Hemoglobin A1C: 7.7 % — AB (ref 4.0–5.6)

## 2019-08-02 LAB — GLUCOSE, POCT (MANUAL RESULT ENTRY): POC Glucose: 265 mg/dl — AB (ref 70–99)

## 2019-08-02 MED ORDER — GABAPENTIN 300 MG PO CAPS: 300.0000 mg | ORAL_CAPSULE | Freq: Every day | ORAL | 1 refills | Status: DC

## 2019-08-02 MED ORDER — CARVEDILOL 6.25 MG PO TABS: 6.2500 mg | ORAL_TABLET | Freq: Two times a day (BID) | ORAL | 3 refills | Status: DC

## 2019-08-02 MED ORDER — ATORVASTATIN CALCIUM 40 MG PO TABS: 40.0000 mg | ORAL_TABLET | Freq: Every day | ORAL | 3 refills | Status: DC

## 2019-08-02 MED FILL — NovoLOG 100 UNIT/ML SOLN: 100 | 84 days supply | Qty: 30 | Fill #1

## 2019-08-02 MED FILL — LANTUS 100 UNITS/ML VIAL: 100 | 28 days supply | Qty: 10 | Fill #3

## 2019-08-02 NOTE — Progress Notes (Signed)
Established Patient Office Visit  Subjective:  Patient ID: Russell Price, male    DOB: Apr 22, 1961  Age: 59 y.o. MRN: [id number]  CC:  Chief Complaint  Patient presents with  . Diabetes    HPI Russell Price presents for medication refills.    HTN: States that he does experience lower BP readings when he is at dialysis.  Reports BP fluctuates; is compliant to meds  DM: States that his BG readigs avg 116, does have a few lower readings of 73 and 75.  Denies any hypoglycemia sxs  CKD: States that he has had tests completed and is currently on a waiting list for kidney transplant.  Attends dialysis T/T/S.  Erectile Dysfunction: Reports that he was unable to afford the Cialis, insurance would not cover it.  Inquiring about something more affordable to help him with his erectile dysfunction.    Current Outpatient Medications  Medication Sig Dispense Refill  . ACCU-CHEK SOFTCLIX LANCETS lancets Use as instructed 3 times daily before meals and at bedtime. E11.9 100 each 12  . aspirin EC 81 MG tablet Take 81 mg by mouth daily.    Marland Kitchen atorvastatin (LIPITOR) 40 MG tablet Take 1 tablet (40 mg total) by mouth at bedtime. 30 tablet 3  . b complex-vitamin c-folic acid (NEPHRO-VITE) 0.8 MG TABS tablet Take 1 tablet by mouth at bedtime.    . Blood Glucose Monitoring Suppl (ACCU-CHEK AVIVA) device Use as instructed 3 times daily before meals and at bedtime. E11.9 1 each 0  . carvedilol (COREG) 6.25 MG tablet Take 1 tablet (6.25 mg total) by mouth 2 (two) times daily. 30 tablet 3  . gabapentin (NEURONTIN) 300 MG capsule Take 1 capsule (300 mg total) by mouth daily. 90 capsule 1  . glucose blood (ACCU-CHEK AVIVA PLUS) test strip Use 4 times daily before meals and at bedtime. E11.9 100 each 12  . insulin aspart (NOVOLOG) 100 UNIT/ML injection 0-12 units 3 times daily before meals as well as per sliding scale (Patient taking differently: Inject 0-12 Units into the skin 3 (three)  times daily with meals. per sliding scale) 10 mL 5  . insulin glargine (LANTUS) 100 UNIT/ML injection Inject 0.1 mLs (10 Units total) into the skin 2 (two) times daily. 30 mL 6  . Lancet Devices (ACCU-CHEK SOFTCLIX) lancets Use as instructed 3 times daily before meals and at bedtime. E11.9 1 each 12  . Misc. Devices MISC Tub transfer bench. Dx: acute discitis and low back pain 1 each 0  . Misc. Devices MISC Blood Pressure monitor. Dx- Hypertension. 1 each 0  . RENAGEL 800 MG tablet Take 800-2,400 mg by mouth See admin instructions. Take 2400 mg by mouth 3 times daily with meals and take 800 mg by mouth twice daily with snacks    . tadalafil (CIALIS) 10 MG tablet Take 1 tablet (10 mg total) by mouth every other day as needed for erectile dysfunction. 10 tablet 1  . timolol (BETIMOL) 0.5 % ophthalmic solution Place 1 drop into the left eye 2 (two) times daily.    Marland Kitchen triamcinolone cream (KENALOG) 0.1 % Apply 1 application topically 2 (two) times daily. 45 g 1   No current facility-administered medications for this visit.     Past Medical History:  Diagnosis Date  . Back pain 03/12/2018  . CAD (coronary artery disease)    a. underwent cath in 02/2016 after an abnormal nuc which showed 2V CAD in RCA and OM3 with no good targets  for PCI. Medical managment recommended.   . Chronic kidney disease   . Diabetes mellitus without complication ([place])    type II - followed by Dr Chalmers Cater   . Difficult intubation    ER intubation 2016  . ESRD (end stage renal disease) ([place])    Tues, Th, Sat [place]  . Fever 03/12/2018  . Glaucoma   . Hemodialysis patient [facility])    has been on dialysis since approx 10/2016   . Hypertension    followed by [facility]  . Stroke [facility])    patient denies on 02/19/2017     Past Surgical History:  Procedure Laterality Date  . A/V FISTULAGRAM Left 11/27/2016   Procedure: A/V Fistulagram;  Surgeon: Waynetta Sandy, MD;  Location: [place] [facility]  LAB;  Service: Cardiovascular;  Laterality: Left;  . A/V FISTULAGRAM Left 05/07/2017   Procedure: A/V FISTULAGRAM;  Surgeon: Waynetta Sandy, MD;  Location: [place] [facility];  Service: Cardiovascular;  Laterality: Left;  . A/V FISTULAGRAM Left 07/14/2017   Procedure: A/V FISTULAGRAM;  Surgeon: Waynetta Sandy, MD;  Location: [place] [facility];  Service: Cardiovascular;  Laterality: Left;  . AV FISTULA PLACEMENT Left 03/12/2016   Procedure: Left Arm ARTERIOVENOUS (AV) FISTULA CREATION;  Surgeon: Waynetta Sandy, MD;  Location: [facility] OR;  Service: Vascular;  Laterality: Left;  . AV FISTULA PLACEMENT Left 07/18/2017   Procedure: INSERTION OF ARTERIOVENOUS (AV) GORE-TEX GRAFT ARM LEFT UPPER ARM;  Surgeon: Conrad [place], MD;  Location: [place];  Service: Vascular;  Laterality: Left;  . BASCILIC VEIN TRANSPOSITION Left 05/14/2016   Procedure: SECOND STAGE LEFT BASILIC VEIN TRANSPOSITION;  Surgeon: Waynetta Sandy, MD;  Location: [place];  Service: Vascular;  Laterality: Left;  . BRONCHOSCOPY  02/14/2015   for pulm hemorrhage  . CARDIAC CATHETERIZATION N/A 03/04/2016   Procedure: Left Heart Cath and Coronary Angiography;  Surgeon: Leonie Man, MD;  Location: [place] [facility];  Service: Cardiovascular;  Laterality: N/A;  . COLONOSCOPY WITH PROPOFOL N/A 04/07/2017   Procedure: COLONOSCOPY WITH PROPOFOL;  Surgeon: Ronnette Juniper, MD;  Location: [place];  Service: Gastroenterology;  Laterality: N/A;  . DIALYSIS/PERMA CATHETER INSERTION Right 04/30/2019   Procedure: DIALYSIS/PERMA CATHETER INSERTION;  Surgeon: Elam Dutch, MD;  Location: [place] [facility];  Service: Cardiovascular;  Laterality: Right;  . EYE SURGERY Left   . INSERTION OF DIALYSIS CATHETER Right 11/08/2016   Procedure: INSERTION OF DIALYSIS CATHETER;  Surgeon: Rosetta Posner, MD;  Location: [place];  Service: Vascular;  Laterality: Right;  . IR PARACENTESIS  11/13/2016  . LIGATION OF ARTERIOVENOUS   FISTULA Left 07/18/2017   Procedure: LIGATION OF ARTERIOVENOUS  FISTULA;  Surgeon: Conrad Conway, MD;  Location: [place];  Service: Vascular;  Laterality: Left;  . PERIPHERAL VASCULAR BALLOON ANGIOPLASTY Left 11/27/2016   Procedure: Peripheral Vascular Balloon Angioplasty;  Surgeon: Waynetta Sandy, MD;  Location: [place] [facility];  Service: Cardiovascular;  Laterality: Left;  ARM FISTULA  . PERIPHERAL VASCULAR BALLOON ANGIOPLASTY Left 05/07/2017   Procedure: PERIPHERAL VASCULAR BALLOON ANGIOPLASTY;  Surgeon: Waynetta Sandy, MD;  Location: [place] [facility];  Service: Cardiovascular;  Laterality: Left;    Family History  Problem Relation Age of Onset  . Diabetes Mother     Social History   Socioeconomic History  . Marital status: Married    Spouse name: Not on file  . Number of children: Not on file  . Years of education:  Not on file  . Highest education level: Not on file  Occupational History  . Not on file  Tobacco Use  . Smoking status: Former Smoker    Packs/day: 0.00    Years: 4.00    Pack years: 0.00    Quit date: 02/23/1983    Years since quitting: 36.4  . Smokeless tobacco: Never Used  Substance and Sexual Activity  . Alcohol use: No  . Drug use: No  . Sexual activity: Not on file  Other Topics Concern  . Not on file  Social History Narrative  . Not on file   Social Determinants of Health   Financial Resource Strain:   . Difficulty of Paying Living Expenses:   Food Insecurity:   . Worried About Charity fundraiser in the Last Year:   . Arboriculturist in the Last Year:   Transportation Needs:   . Film/video editor (Medical):   Marland Kitchen Lack of Transportation (Non-Medical):   Physical Activity:   . Days of Exercise per Week:   . Minutes of Exercise per Session:   Stress:   . Feeling of Stress :   Social Connections:   . Frequency of Communication with Friends and Family:   . Frequency of Social Gatherings with Friends and Family:   .  Attends Religious Services:   . Active Member of Clubs or Organizations:   . Attends Archivist Meetings:   Marland Kitchen Marital Status:   Intimate Partner Violence:   . Fear of Current or Ex-Partner:   . Emotionally Abused:   Marland Kitchen Physically Abused:   . Sexually Abused:     Outpatient Medications Prior to Visit  Medication Sig Dispense Refill  . ACCU-CHEK SOFTCLIX LANCETS lancets Use as instructed 3 times daily before meals and at bedtime. E11.9 100 each 12  . aspirin EC 81 MG tablet Take 81 mg by mouth daily.    Marland Kitchen b complex-vitamin c-folic acid (NEPHRO-VITE) 0.8 MG TABS tablet Take 1 tablet by mouth at bedtime.    . Blood Glucose Monitoring Suppl (ACCU-CHEK AVIVA) device Use as instructed 3 times daily before meals and at bedtime. E11.9 1 each 0  . glucose blood (ACCU-CHEK AVIVA PLUS) test strip Use 4 times daily before meals and at bedtime. E11.9 100 each 12  . insulin aspart (NOVOLOG) 100 UNIT/ML injection 0-12 units 3 times daily before meals as well as per sliding scale (Patient taking differently: Inject 0-12 Units into the skin 3 (three) times daily with meals. per sliding scale) 10 mL 5  . insulin glargine (LANTUS) 100 UNIT/ML injection Inject 0.1 mLs (10 Units total) into the skin 2 (two) times daily. 30 mL 6  . Lancet Devices (ACCU-CHEK SOFTCLIX) lancets Use as instructed 3 times daily before meals and at bedtime. E11.9 1 each 12  . Misc. Devices MISC Tub transfer bench. Dx: acute discitis and low back pain 1 each 0  . Misc. Devices MISC Blood Pressure monitor. Dx- Hypertension. 1 each 0  . RENAGEL 800 MG tablet Take 800-2,400 mg by mouth See admin instructions. Take 2400 mg by mouth 3 times daily with meals and take 800 mg by mouth twice daily with snacks    . tadalafil (CIALIS) 10 MG tablet Take 1 tablet (10 mg total) by mouth every other day as needed for erectile dysfunction. 10 tablet 1  . timolol (BETIMOL) 0.5 % ophthalmic solution Place 1 drop into the left eye 2 (two) times  daily.    Marland Kitchen  atorvastatin (LIPITOR) 40 MG tablet Take 1 tablet (40 mg total) by mouth daily at 6 PM. (Patient taking differently: Take 40 mg by mouth at bedtime. ) 90 tablet 1  . carvedilol (COREG) 6.25 MG tablet Take 6.25 mg by mouth 2 (two) times daily.    Marland Kitchen gabapentin (NEURONTIN) 300 MG capsule Take 1 capsule (300 mg total) by mouth daily. 90 capsule 1  . triamcinolone cream (KENALOG) 0.1 % Apply 1 application topically 2 (two) times daily. 45 g 1   No facility-administered medications prior to visit.    Allergies  Allergen Reactions  . Other Rash    chlorhexidine    ROS Review of Systems  Constitutional: Negative.   HENT: Negative.   Eyes: Positive for visual disturbance.  Respiratory: Negative for cough, chest tightness and shortness of breath.   Cardiovascular: Negative for chest pain, palpitations and leg swelling.  Gastrointestinal: Negative.   Endocrine: Negative.   Genitourinary: Negative for difficulty urinating, frequency and urgency.  Musculoskeletal: Negative.   Skin: Negative.   Allergic/Immunologic: Negative.   Neurological: Negative.   Hematological: Negative.   Psychiatric/Behavioral: Negative for sleep disturbance.      Objective:    Physical Exam  Constitutional: He is oriented to person, place, and time. He appears well-developed and well-nourished.  HENT:  Head: Normocephalic.  Eyes: Left eye exhibits no discharge.  Blind in right eye, no discharge noted  Neck: No tracheal deviation present. No thyromegaly present.  Cardiovascular: Normal rate.  Murmur heard. Pulmonary/Chest: Effort normal and breath sounds normal.  Abdominal: Soft. Bowel sounds are normal.  Musculoskeletal:        General: Normal range of motion.     Cervical back: Normal range of motion and neck supple.  Neurological: He is alert and oriented to person, place, and time.  Skin: Skin is warm and dry.  Psychiatric: He has a normal mood and affect. His behavior is normal.  Judgment and thought content normal.  Vitals reviewed.   BP (!) 189/90 (BP Location: Right Arm, Patient Position: Sitting, Cuff Size: Normal)   Pulse (!) 50   Temp 98.4 F (36.9 C) (Oral)   Resp 18   Ht 5\' 11"  (1.803 m)   Wt 163 lb (73.9 kg)   SpO2 100%   BMI 22.73 kg/m  Wt Readings from Last 3 Encounters:  08/02/19 163 lb (73.9 kg)  07/23/19 160 lb (72.6 kg)  06/30/19 160 lb 3.2 oz (72.7 kg)     Health Maintenance Due  Topic Date Due  . TETANUS/TDAP  Never done    There are no preventive care reminders to display for this patient.  Lab Results  Component Value Date   TSH 1.141 02/01/2008   Lab Results  Component Value Date   WBC 5.6 04/03/2019   HGB 12.6 (L) 04/30/2019   HCT 37.0 (L) 04/30/2019   MCV 93.6 04/03/2019   PLT 102 (L) 04/03/2019   Lab Results  Component Value Date   NA 139 04/30/2019   K 4.6 04/30/2019   CO2 26 04/03/2019   GLUCOSE 128 (H) 04/03/2019   BUN 38 (H) 04/03/2019   CREATININE 6.92 (H) 04/03/2019   BILITOT 0.3 04/06/2018   ALKPHOS 113 07/21/2017   AST 17 04/06/2018   ALT <3 (L) 04/06/2018   PROT 8.1 04/06/2018   ALBUMIN 2.4 (L) 03/17/2018   CALCIUM 8.9 04/03/2019   ANIONGAP 15 04/03/2019   Lab Results  Component Value Date   CHOL 89 (L) 02/08/2019   Lab  Results  Component Value Date   HDL 37 (L) 02/08/2019   Lab Results  Component Value Date   LDLCALC 31 02/08/2019   Lab Results  Component Value Date   TRIG 113 02/08/2019   Lab Results  Component Value Date   CHOLHDL 2.4 02/08/2019   Lab Results  Component Value Date   HGBA1C 7.7 (A) 08/02/2019      Assessment & Plan:   Problem List Items Addressed This Visit      Cardiovascular and Mediastinum   Essential hypertension   Relevant Medications   atorvastatin (LIPITOR) 40 MG tablet   carvedilol (COREG) 6.25 MG tablet     Endocrine   Diabetic neuropathy (HCC)   Relevant Medications   atorvastatin (LIPITOR) 40 MG tablet   gabapentin (NEURONTIN) 300 MG  capsule     Genitourinary   ESRD (end stage renal disease) ([place])    Other Visit Diagnoses    Type 2 diabetes mellitus with chronic kidney disease on chronic dialysis, with long-term current use of insulin (HCC)    -  Primary   Relevant Medications   atorvastatin (LIPITOR) 40 MG tablet   Other Relevant Orders   HgB A1c (Completed)   Glucose (CBG) (Completed)   Hyperlipidemia associated with type 2 diabetes mellitus (HCC)       Relevant Medications   atorvastatin (LIPITOR) 40 MG tablet   Pure hypercholesterolemia       Relevant Medications   atorvastatin (LIPITOR) 40 MG tablet   carvedilol (COREG) 6.25 MG tablet    1. 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 Hyperlipidemia associated with type 2 diabetes mellitus ([place]) Patient not fasting, encouraged patient to return for routine fasting labs. - atorvastatin (LIPITOR) 40 MG tablet; Take 1 tablet (40 mg total) by mouth at bedtime.  Dispense: 30 tablet; Refill: 3  3. Essential hypertension Encouraged patient to keep a log of blood pressure readings including pulse readings, bring to next office visit - carvedilol (COREG) 6.25 MG tablet; Take 1 tablet (6.25 mg total) by mouth 2 (two) times daily.  Dispense: 30 tablet; Refill: 3  4. ESRD (end stage renal disease) [facility]) Patient will continue to follow-up with nephrology  5. Diabetic mononeuropathy associated with diabetes mellitus due to underlying condition [facility]) Encouraged patient to continue using moisturizer on feet, feet on a daily basis. - gabapentin (NEURONTIN) 300 MG capsule; Take 1 capsule (300 mg total) by mouth daily.  Dispense: 90 capsule; Refill: 1  6. Prostate cancer screening   Patient had brought in FMLA paperwork for his wife who is his caregiver and transports him to his  appointments, paperwork filled out.  Patient has expressed concern over her ability to afford Cialis, consulted Dr. Joya Gaskins, Cialis is the most affordable of these medications at our pharmacy, encouraged patient to follow-up with pharmacy to see if there are any patient assistance programs for this medication.  Patient understands and agrees.  Meds ordered this encounter  Medications  . atorvastatin (LIPITOR) 40 MG tablet    Sig: Take 1 tablet (40 mg total) by mouth at bedtime.    Dispense:  30 tablet    Refill:  3    Order Specific Question:   Supervising Provider    Answer:   Joya Gaskins, PATRICK E [[id number]]  . carvedilol (COREG) 6.25 MG tablet  Sig: Take 1 tablet (6.25 mg total) by mouth 2 (two) times daily.    Dispense:  30 tablet    Refill:  3    Order Specific Question:   Supervising Provider    Answer:   Joya Gaskins, PATRICK E [[id number]]  . gabapentin (NEURONTIN) 300 MG capsule    Sig: Take 1 capsule (300 mg total) by mouth daily.    Dispense:  90 capsule    Refill:  1    Order Specific Question:   Supervising Provider    Answer:   Elsie Stain [[id number]]      Follow-up: Return in about 2 days (around 08/04/2019) for fasting labs.    Loraine Grip Mayers, PA-C

## 2019-08-02 NOTE — Patient Instructions (Signed)
Please return for fasting labs this week Continue to monitor blood pressure at home and keep log   Managing Your Hypertension Hypertension is commonly called high blood pressure. This is when the force of your blood pressing against the walls of your arteries is too strong. Arteries are blood vessels that carry blood from your heart throughout your body. Hypertension forces the heart to work harder to pump blood, and may cause the arteries to become narrow or stiff. Having untreated or uncontrolled hypertension can cause heart attack, stroke, kidney disease, and other problems. What are blood pressure readings? A blood pressure reading consists of a higher number over a lower number. Ideally, your blood pressure should be below 120/80. The first ("top") number is called the systolic pressure. It is a measure of the pressure in your arteries as your heart beats. The second ("bottom") number is called the diastolic pressure. It is a measure of the pressure in your arteries as the heart relaxes. What does my blood pressure reading mean? Blood pressure is classified into four stages. Based on your blood pressure reading, your health care provider may use the following stages to determine what type of treatment you need, if any. Systolic pressure and diastolic pressure are measured in a unit called mm Hg. Normal  Systolic pressure: below 846.  Diastolic pressure: below 80. Elevated  Systolic pressure: 962-952.  Diastolic pressure: below 80. Hypertension stage 1  Systolic pressure: 841-324.  Diastolic pressure: 40-10. Hypertension stage 2  Systolic pressure: 272 or above.  Diastolic pressure: 90 or above. What health risks are associated with hypertension? Managing your hypertension is an important responsibility. Uncontrolled hypertension can lead to:  A heart attack.  A stroke.  A weakened blood vessel (aneurysm).  Heart failure.  Kidney damage.  Eye damage.  Metabolic  syndrome.  Memory and concentration problems. What changes can I make to manage my hypertension? Hypertension can be managed by making lifestyle changes and possibly by taking medicines. Your health care provider will help you make a plan to bring your blood pressure within a normal range. Eating and drinking   Eat a diet that is high in fiber and potassium, and low in salt (sodium), added sugar, and fat. An example eating plan is called the DASH (Dietary Approaches to Stop Hypertension) diet. To eat this way: ? Eat plenty of fresh fruits and vegetables. Try to fill half of your plate at each meal with fruits and vegetables. ? Eat whole grains, such as whole wheat pasta, brown rice, or whole grain bread. Fill about one quarter of your plate with whole grains. ? Eat low-fat diary products. ? Avoid fatty cuts of meat, processed or cured meats, and poultry with skin. Fill about one quarter of your plate with lean proteins such as fish, chicken without skin, beans, eggs, and tofu. ? Avoid premade and processed foods. These tend to be higher in sodium, added sugar, and fat.  Reduce your daily sodium intake. Most people with hypertension should eat less than 1,500 mg of sodium a day.  Limit alcohol intake to no more than 1 drink a day for nonpregnant women and 2 drinks a day for men. One drink equals 12 oz of beer, 5 oz of wine, or 1 oz of hard liquor. Lifestyle  Work with your health care provider to maintain a healthy body weight, or to lose weight. Ask what an ideal weight is for you.  Get at least 30 minutes of exercise that causes your heart to  beat faster (aerobic exercise) most days of the week. Activities may include walking, swimming, or biking.  Include exercise to strengthen your muscles (resistance exercise), such as weight lifting, as part of your weekly exercise routine. Try to do these types of exercises for 30 minutes at least 3 days a week.  Do not use any products that contain  nicotine or tobacco, such as cigarettes and e-cigarettes. If you need help quitting, ask your health care provider.  Control any long-term (chronic) conditions you have, such as high cholesterol or diabetes. Monitoring  Monitor your blood pressure at home as told by your health care provider. Your personal target blood pressure may vary depending on your medical conditions, your age, and other factors.  Have your blood pressure checked regularly, as often as told by your health care provider. Working with your health care provider  Review all the medicines you take with your health care provider because there may be side effects or interactions.  Talk with your health care provider about your diet, exercise habits, and other lifestyle factors that may be contributing to hypertension.  Visit your health care provider regularly. Your health care provider can help you create and adjust your plan for managing hypertension. Will I need medicine to control my blood pressure? Your health care provider may prescribe medicine if lifestyle changes are not enough to get your blood pressure under control, and if:  Your systolic blood pressure is 130 or higher.  Your diastolic blood pressure is 80 or higher. Take medicines only as told by your health care provider. Follow the directions carefully. Blood pressure medicines must be taken as prescribed. The medicine does not work as well when you skip doses. Skipping doses also puts you at risk for problems. Contact a health care provider if:  You think you are having a reaction to medicines you have taken.  You have repeated (recurrent) headaches.  You feel dizzy.  You have swelling in your ankles.  You have trouble with your vision. Get help right away if:  You develop a severe headache or confusion.  You have unusual weakness or numbness, or you feel faint.  You have severe pain in your chest or abdomen.  You vomit repeatedly.  You have  trouble breathing. Summary  Hypertension is when the force of blood pumping through your arteries is too strong. If this condition is not controlled, it may put you at risk for serious complications.  Your personal target blood pressure may vary depending on your medical conditions, your age, and other factors. For most people, a normal blood pressure is less than 120/80.  Hypertension is managed by lifestyle changes, medicines, or both. Lifestyle changes include weight loss, eating a healthy, low-sodium diet, exercising more, and limiting alcohol. This information is not intended to replace advice given to you by your health care provider. Make sure you discuss any questions you have with your health care provider. Document Revised: 08/28/2018 Document Reviewed: 04/03/2016 Elsevier Patient Education  [place].

## 2019-08-03 DIAGNOSIS — Z992 Dependence on renal dialysis: Secondary | ICD-10-CM | POA: Diagnosis not present

## 2019-08-03 DIAGNOSIS — N186 End stage renal disease: Secondary | ICD-10-CM | POA: Diagnosis not present

## 2019-08-03 DIAGNOSIS — N2581 Secondary hyperparathyroidism of renal origin: Secondary | ICD-10-CM | POA: Diagnosis not present

## 2019-08-04 ENCOUNTER — Other Ambulatory Visit: Payer: Self-pay

## 2019-08-04 ENCOUNTER — Ambulatory Visit: Payer: Medicare HMO | Attending: Family Medicine

## 2019-08-04 DIAGNOSIS — E1169 Type 2 diabetes mellitus with other specified complication: Secondary | ICD-10-CM | POA: Diagnosis not present

## 2019-08-04 DIAGNOSIS — E1122 Type 2 diabetes mellitus with diabetic chronic kidney disease: Secondary | ICD-10-CM | POA: Diagnosis not present

## 2019-08-04 DIAGNOSIS — Z125 Encounter for screening for malignant neoplasm of prostate: Secondary | ICD-10-CM | POA: Diagnosis not present

## 2019-08-04 DIAGNOSIS — Z794 Long term (current) use of insulin: Secondary | ICD-10-CM | POA: Diagnosis not present

## 2019-08-04 DIAGNOSIS — E785 Hyperlipidemia, unspecified: Secondary | ICD-10-CM

## 2019-08-04 DIAGNOSIS — Z992 Dependence on renal dialysis: Secondary | ICD-10-CM

## 2019-08-04 DIAGNOSIS — N186 End stage renal disease: Secondary | ICD-10-CM | POA: Diagnosis not present

## 2019-08-05 DIAGNOSIS — N186 End stage renal disease: Secondary | ICD-10-CM | POA: Diagnosis not present

## 2019-08-05 DIAGNOSIS — N2581 Secondary hyperparathyroidism of renal origin: Secondary | ICD-10-CM | POA: Diagnosis not present

## 2019-08-05 DIAGNOSIS — Z992 Dependence on renal dialysis: Secondary | ICD-10-CM | POA: Diagnosis not present

## 2019-08-05 LAB — COMP. METABOLIC PANEL (12)
AST: 20 IU/L (ref 0–40)
Albumin/Globulin Ratio: 1.5 (ref 1.2–2.2)
Albumin: 4.8 g/dL (ref 3.8–4.9)
Alkaline Phosphatase: 90 IU/L (ref 39–117)
BUN/Creatinine Ratio: 7 — ABNORMAL LOW (ref 10–24)
BUN: 37 mg/dL — ABNORMAL HIGH (ref 8–27)
Bilirubin Total: 0.5 mg/dL (ref 0.0–1.2)
Calcium: 9.5 mg/dL (ref 8.6–10.2)
Chloride: 92 mmol/L — ABNORMAL LOW (ref 96–106)
Creatinine, Ser: 5.41 mg/dL — ABNORMAL HIGH (ref 0.76–1.27)
GFR calc Af Amer: 12 mL/min/{1.73_m2} — ABNORMAL LOW (ref >59)
GFR calc non Af Amer: 11 mL/min/{1.73_m2} — ABNORMAL LOW (ref >59)
Globulin, Total: 3.2 g/dL (ref 1.5–4.5)
Glucose: 129 mg/dL — ABNORMAL HIGH (ref 65–99)
Potassium: 4.8 mmol/L (ref 3.5–5.2)
Sodium: 135 mmol/L (ref 134–144)
Total Protein: 8 g/dL (ref 6.0–8.5)

## 2019-08-05 LAB — LIPID PANEL
Chol/HDL Ratio: 2.2 ratio (ref 0.0–5.0)
Cholesterol, Total: 100 mg/dL (ref 100–199)
HDL: 46 mg/dL (ref >39)
LDL Chol Calc (NIH): 35 mg/dL (ref 0–99)
Triglycerides: 97 mg/dL (ref 0–149)
VLDL Cholesterol Cal: 19 mg/dL (ref 5–40)

## 2019-08-05 LAB — CBC WITH DIFFERENTIAL/PLATELET
Basophils Absolute: 0 10*3/uL (ref 0.0–0.2)
Basos: 1 %
EOS (ABSOLUTE): 0.2 10*3/uL (ref 0.0–0.4)
Eos: 4 %
Hematocrit: 38.1 % (ref 37.5–51.0)
Hemoglobin: 13.2 g/dL (ref 13.0–17.7)
Immature Grans (Abs): 0 10*3/uL (ref 0.0–0.1)
Immature Granulocytes: 0 %
Lymphocytes Absolute: 1.4 10*3/uL (ref 0.7–3.1)
Lymphs: 27 %
MCH: 32.3 pg (ref 26.6–33.0)
MCHC: 34.6 g/dL (ref 31.5–35.7)
MCV: 93 fL (ref 79–97)
Monocytes Absolute: 0.4 10*3/uL (ref 0.1–0.9)
Monocytes: 7 %
Neutrophils Absolute: 3.2 10*3/uL (ref 1.4–7.0)
Neutrophils: 61 %
Platelets: 111 10*3/uL — ABNORMAL LOW (ref 150–450)
RBC: 4.09 x10E6/uL — ABNORMAL LOW (ref 4.14–5.80)
RDW: 15.3 % (ref 11.6–15.4)
WBC: 5.2 10*3/uL (ref 3.4–10.8)

## 2019-08-05 LAB — PSA, TOTAL AND FREE
PSA, Free Pct: 70 %
PSA, Free: 0.49 ng/mL
Prostate Specific Ag, Serum: 0.7 ng/mL (ref 0.0–4.0)

## 2019-08-07 DIAGNOSIS — N186 End stage renal disease: Secondary | ICD-10-CM | POA: Diagnosis not present

## 2019-08-07 DIAGNOSIS — Z992 Dependence on renal dialysis: Secondary | ICD-10-CM | POA: Diagnosis not present

## 2019-08-07 DIAGNOSIS — N2581 Secondary hyperparathyroidism of renal origin: Secondary | ICD-10-CM | POA: Diagnosis not present

## 2019-08-09 DIAGNOSIS — Z7982 Long term (current) use of aspirin: Secondary | ICD-10-CM | POA: Diagnosis not present

## 2019-08-09 DIAGNOSIS — Z992 Dependence on renal dialysis: Secondary | ICD-10-CM | POA: Diagnosis not present

## 2019-08-09 DIAGNOSIS — I120 Hypertensive chronic kidney disease with stage 5 chronic kidney disease or end stage renal disease: Secondary | ICD-10-CM | POA: Diagnosis not present

## 2019-08-09 DIAGNOSIS — E119 Type 2 diabetes mellitus without complications: Secondary | ICD-10-CM | POA: Diagnosis not present

## 2019-08-09 DIAGNOSIS — Z87891 Personal history of nicotine dependence: Secondary | ICD-10-CM | POA: Diagnosis not present

## 2019-08-09 DIAGNOSIS — E1122 Type 2 diabetes mellitus with diabetic chronic kidney disease: Secondary | ICD-10-CM | POA: Diagnosis not present

## 2019-08-09 DIAGNOSIS — I2510 Atherosclerotic heart disease of native coronary artery without angina pectoris: Secondary | ICD-10-CM | POA: Diagnosis not present

## 2019-08-09 DIAGNOSIS — N186 End stage renal disease: Secondary | ICD-10-CM | POA: Diagnosis not present

## 2019-08-10 DIAGNOSIS — N186 End stage renal disease: Secondary | ICD-10-CM | POA: Diagnosis not present

## 2019-08-10 DIAGNOSIS — Z992 Dependence on renal dialysis: Secondary | ICD-10-CM | POA: Diagnosis not present

## 2019-08-10 DIAGNOSIS — N2581 Secondary hyperparathyroidism of renal origin: Secondary | ICD-10-CM | POA: Diagnosis not present

## 2019-08-11 ENCOUNTER — Telehealth: Payer: Self-pay | Admitting: *Deleted

## 2019-08-11 NOTE — Telephone Encounter (Signed)
-----   Message from Kennieth Rad, [place] sent at 08/05/2019  1:10 PM EDT ----- Please call patient and let him know that his prostate cancer screening was within normal limits, kidney function unchanged, platelets unchanged, he should continue following up with nephrology.  He will continue his current regimen as discussed in the office.

## 2019-08-11 NOTE — Telephone Encounter (Signed)
Patient verified DOB Patient is aware of PSA being normal, kidney and platelets stable. Patient advised to continue with nephrology follow ups and take medications as currently prescribed.

## 2019-08-12 DIAGNOSIS — Z992 Dependence on renal dialysis: Secondary | ICD-10-CM | POA: Diagnosis not present

## 2019-08-12 DIAGNOSIS — N186 End stage renal disease: Secondary | ICD-10-CM | POA: Diagnosis not present

## 2019-08-12 DIAGNOSIS — N2581 Secondary hyperparathyroidism of renal origin: Secondary | ICD-10-CM | POA: Diagnosis not present

## 2019-08-14 DIAGNOSIS — Z992 Dependence on renal dialysis: Secondary | ICD-10-CM | POA: Diagnosis not present

## 2019-08-14 DIAGNOSIS — N2581 Secondary hyperparathyroidism of renal origin: Secondary | ICD-10-CM | POA: Diagnosis not present

## 2019-08-14 DIAGNOSIS — N186 End stage renal disease: Secondary | ICD-10-CM | POA: Diagnosis not present

## 2019-08-17 DIAGNOSIS — N186 End stage renal disease: Secondary | ICD-10-CM | POA: Diagnosis not present

## 2019-08-17 DIAGNOSIS — Z992 Dependence on renal dialysis: Secondary | ICD-10-CM | POA: Diagnosis not present

## 2019-08-17 DIAGNOSIS — N2581 Secondary hyperparathyroidism of renal origin: Secondary | ICD-10-CM | POA: Diagnosis not present

## 2019-08-18 DIAGNOSIS — Z992 Dependence on renal dialysis: Secondary | ICD-10-CM | POA: Diagnosis not present

## 2019-08-18 DIAGNOSIS — E1122 Type 2 diabetes mellitus with diabetic chronic kidney disease: Secondary | ICD-10-CM | POA: Diagnosis not present

## 2019-08-18 DIAGNOSIS — N186 End stage renal disease: Secondary | ICD-10-CM | POA: Diagnosis not present

## 2019-08-19 DIAGNOSIS — Z992 Dependence on renal dialysis: Secondary | ICD-10-CM | POA: Diagnosis not present

## 2019-08-19 DIAGNOSIS — N2581 Secondary hyperparathyroidism of renal origin: Secondary | ICD-10-CM | POA: Diagnosis not present

## 2019-08-19 DIAGNOSIS — N186 End stage renal disease: Secondary | ICD-10-CM | POA: Diagnosis not present

## 2019-08-21 DIAGNOSIS — N2581 Secondary hyperparathyroidism of renal origin: Secondary | ICD-10-CM | POA: Diagnosis not present

## 2019-08-21 DIAGNOSIS — N186 End stage renal disease: Secondary | ICD-10-CM | POA: Diagnosis not present

## 2019-08-21 DIAGNOSIS — Z992 Dependence on renal dialysis: Secondary | ICD-10-CM | POA: Diagnosis not present

## 2019-08-24 DIAGNOSIS — Z992 Dependence on renal dialysis: Secondary | ICD-10-CM | POA: Diagnosis not present

## 2019-08-24 DIAGNOSIS — N2581 Secondary hyperparathyroidism of renal origin: Secondary | ICD-10-CM | POA: Diagnosis not present

## 2019-08-24 DIAGNOSIS — N186 End stage renal disease: Secondary | ICD-10-CM | POA: Diagnosis not present

## 2019-08-24 MED FILL — CARVEDILOL 6.25 MG TABLET: 6.25 | 60 days supply | Qty: 120 | Fill #0

## 2019-08-24 MED FILL — ATORVASTATIN CALCIUM 40 MG: 40 | 90 days supply | Qty: 90 | Fill #0

## 2019-08-25 ENCOUNTER — Other Ambulatory Visit: Payer: Self-pay | Admitting: Pharmacist

## 2019-08-25 DIAGNOSIS — N528 Other male erectile dysfunction: Secondary | ICD-10-CM

## 2019-08-25 MED ORDER — TADALAFIL 10 MG PO TABS: 10.0000 mg | ORAL_TABLET | ORAL | 1 refills | Status: DC | PRN

## 2019-08-26 DIAGNOSIS — Z992 Dependence on renal dialysis: Secondary | ICD-10-CM | POA: Diagnosis not present

## 2019-08-26 DIAGNOSIS — N2581 Secondary hyperparathyroidism of renal origin: Secondary | ICD-10-CM | POA: Diagnosis not present

## 2019-08-26 DIAGNOSIS — N186 End stage renal disease: Secondary | ICD-10-CM | POA: Diagnosis not present

## 2019-08-27 DIAGNOSIS — Z992 Dependence on renal dialysis: Secondary | ICD-10-CM | POA: Diagnosis not present

## 2019-08-27 DIAGNOSIS — I2584 Coronary atherosclerosis due to calcified coronary lesion: Secondary | ICD-10-CM | POA: Diagnosis not present

## 2019-08-27 DIAGNOSIS — I120 Hypertensive chronic kidney disease with stage 5 chronic kidney disease or end stage renal disease: Secondary | ICD-10-CM | POA: Diagnosis not present

## 2019-08-27 DIAGNOSIS — E1151 Type 2 diabetes mellitus with diabetic peripheral angiopathy without gangrene: Secondary | ICD-10-CM | POA: Diagnosis not present

## 2019-08-27 DIAGNOSIS — Z87891 Personal history of nicotine dependence: Secondary | ICD-10-CM | POA: Diagnosis not present

## 2019-08-27 DIAGNOSIS — I252 Old myocardial infarction: Secondary | ICD-10-CM | POA: Diagnosis not present

## 2019-08-27 DIAGNOSIS — N186 End stage renal disease: Secondary | ICD-10-CM | POA: Diagnosis not present

## 2019-08-27 DIAGNOSIS — R9439 Abnormal result of other cardiovascular function study: Secondary | ICD-10-CM | POA: Diagnosis not present

## 2019-08-27 DIAGNOSIS — E1122 Type 2 diabetes mellitus with diabetic chronic kidney disease: Secondary | ICD-10-CM | POA: Diagnosis not present

## 2019-08-27 DIAGNOSIS — I2510 Atherosclerotic heart disease of native coronary artery without angina pectoris: Secondary | ICD-10-CM | POA: Diagnosis not present

## 2019-08-28 DIAGNOSIS — N2581 Secondary hyperparathyroidism of renal origin: Secondary | ICD-10-CM | POA: Diagnosis not present

## 2019-08-28 DIAGNOSIS — N186 End stage renal disease: Secondary | ICD-10-CM | POA: Diagnosis not present

## 2019-08-28 DIAGNOSIS — Z992 Dependence on renal dialysis: Secondary | ICD-10-CM | POA: Diagnosis not present

## 2019-08-30 ENCOUNTER — Other Ambulatory Visit: Payer: Self-pay | Admitting: Pharmacist

## 2019-08-30 DIAGNOSIS — N528 Other male erectile dysfunction: Secondary | ICD-10-CM

## 2019-08-30 MED ORDER — TADALAFIL 10 MG PO TABS: 10.0000 mg | ORAL_TABLET | ORAL | 1 refills | Status: DC | PRN

## 2019-08-31 DIAGNOSIS — N2581 Secondary hyperparathyroidism of renal origin: Secondary | ICD-10-CM | POA: Diagnosis not present

## 2019-08-31 DIAGNOSIS — N186 End stage renal disease: Secondary | ICD-10-CM | POA: Diagnosis not present

## 2019-08-31 DIAGNOSIS — Z992 Dependence on renal dialysis: Secondary | ICD-10-CM | POA: Diagnosis not present

## 2019-09-01 DIAGNOSIS — T82858A Stenosis of vascular prosthetic devices, implants and grafts, initial encounter: Secondary | ICD-10-CM | POA: Diagnosis not present

## 2019-09-01 DIAGNOSIS — N186 End stage renal disease: Secondary | ICD-10-CM | POA: Diagnosis not present

## 2019-09-01 DIAGNOSIS — Z992 Dependence on renal dialysis: Secondary | ICD-10-CM | POA: Diagnosis not present

## 2019-09-01 DIAGNOSIS — I871 Compression of vein: Secondary | ICD-10-CM | POA: Diagnosis not present

## 2019-09-02 DIAGNOSIS — N186 End stage renal disease: Secondary | ICD-10-CM | POA: Diagnosis not present

## 2019-09-02 DIAGNOSIS — Z992 Dependence on renal dialysis: Secondary | ICD-10-CM | POA: Diagnosis not present

## 2019-09-02 DIAGNOSIS — N2581 Secondary hyperparathyroidism of renal origin: Secondary | ICD-10-CM | POA: Diagnosis not present

## 2019-09-04 DIAGNOSIS — N2581 Secondary hyperparathyroidism of renal origin: Secondary | ICD-10-CM | POA: Diagnosis not present

## 2019-09-04 DIAGNOSIS — Z992 Dependence on renal dialysis: Secondary | ICD-10-CM | POA: Diagnosis not present

## 2019-09-04 DIAGNOSIS — N186 End stage renal disease: Secondary | ICD-10-CM | POA: Diagnosis not present

## 2019-09-07 DIAGNOSIS — Z992 Dependence on renal dialysis: Secondary | ICD-10-CM | POA: Diagnosis not present

## 2019-09-07 DIAGNOSIS — N186 End stage renal disease: Secondary | ICD-10-CM | POA: Diagnosis not present

## 2019-09-07 DIAGNOSIS — N2581 Secondary hyperparathyroidism of renal origin: Secondary | ICD-10-CM | POA: Diagnosis not present

## 2019-09-09 DIAGNOSIS — Z992 Dependence on renal dialysis: Secondary | ICD-10-CM | POA: Diagnosis not present

## 2019-09-09 DIAGNOSIS — N186 End stage renal disease: Secondary | ICD-10-CM | POA: Diagnosis not present

## 2019-09-09 DIAGNOSIS — N2581 Secondary hyperparathyroidism of renal origin: Secondary | ICD-10-CM | POA: Diagnosis not present

## 2019-09-11 DIAGNOSIS — Z992 Dependence on renal dialysis: Secondary | ICD-10-CM | POA: Diagnosis not present

## 2019-09-11 DIAGNOSIS — N186 End stage renal disease: Secondary | ICD-10-CM | POA: Diagnosis not present

## 2019-09-11 DIAGNOSIS — N2581 Secondary hyperparathyroidism of renal origin: Secondary | ICD-10-CM | POA: Diagnosis not present

## 2019-09-15 DIAGNOSIS — N186 End stage renal disease: Secondary | ICD-10-CM | POA: Diagnosis not present

## 2019-09-15 DIAGNOSIS — N2581 Secondary hyperparathyroidism of renal origin: Secondary | ICD-10-CM | POA: Diagnosis not present

## 2019-09-15 DIAGNOSIS — Z992 Dependence on renal dialysis: Secondary | ICD-10-CM | POA: Diagnosis not present

## 2019-09-16 DIAGNOSIS — Z992 Dependence on renal dialysis: Secondary | ICD-10-CM | POA: Diagnosis not present

## 2019-09-16 DIAGNOSIS — N2581 Secondary hyperparathyroidism of renal origin: Secondary | ICD-10-CM | POA: Diagnosis not present

## 2019-09-16 DIAGNOSIS — N186 End stage renal disease: Secondary | ICD-10-CM | POA: Diagnosis not present

## 2019-09-17 DIAGNOSIS — Z992 Dependence on renal dialysis: Secondary | ICD-10-CM | POA: Diagnosis not present

## 2019-09-17 DIAGNOSIS — E1122 Type 2 diabetes mellitus with diabetic chronic kidney disease: Secondary | ICD-10-CM | POA: Diagnosis not present

## 2019-09-17 DIAGNOSIS — N186 End stage renal disease: Secondary | ICD-10-CM | POA: Diagnosis not present

## 2019-09-18 DIAGNOSIS — N186 End stage renal disease: Secondary | ICD-10-CM | POA: Diagnosis not present

## 2019-09-18 DIAGNOSIS — Z992 Dependence on renal dialysis: Secondary | ICD-10-CM | POA: Diagnosis not present

## 2019-09-18 DIAGNOSIS — N2581 Secondary hyperparathyroidism of renal origin: Secondary | ICD-10-CM | POA: Diagnosis not present

## 2019-09-20 DIAGNOSIS — E113512 Type 2 diabetes mellitus with proliferative diabetic retinopathy with macular edema, left eye: Secondary | ICD-10-CM | POA: Diagnosis not present

## 2019-09-21 DIAGNOSIS — Z992 Dependence on renal dialysis: Secondary | ICD-10-CM | POA: Diagnosis not present

## 2019-09-21 DIAGNOSIS — N186 End stage renal disease: Secondary | ICD-10-CM | POA: Diagnosis not present

## 2019-09-21 DIAGNOSIS — N2581 Secondary hyperparathyroidism of renal origin: Secondary | ICD-10-CM | POA: Diagnosis not present

## 2019-09-23 DIAGNOSIS — N2581 Secondary hyperparathyroidism of renal origin: Secondary | ICD-10-CM | POA: Diagnosis not present

## 2019-09-23 DIAGNOSIS — Z992 Dependence on renal dialysis: Secondary | ICD-10-CM | POA: Diagnosis not present

## 2019-09-23 DIAGNOSIS — N186 End stage renal disease: Secondary | ICD-10-CM | POA: Diagnosis not present

## 2019-09-25 DIAGNOSIS — N2581 Secondary hyperparathyroidism of renal origin: Secondary | ICD-10-CM | POA: Diagnosis not present

## 2019-09-25 DIAGNOSIS — N186 End stage renal disease: Secondary | ICD-10-CM | POA: Diagnosis not present

## 2019-09-25 DIAGNOSIS — Z992 Dependence on renal dialysis: Secondary | ICD-10-CM | POA: Diagnosis not present

## 2019-09-28 DIAGNOSIS — N186 End stage renal disease: Secondary | ICD-10-CM | POA: Diagnosis not present

## 2019-09-28 DIAGNOSIS — Z992 Dependence on renal dialysis: Secondary | ICD-10-CM | POA: Diagnosis not present

## 2019-09-28 DIAGNOSIS — N2581 Secondary hyperparathyroidism of renal origin: Secondary | ICD-10-CM | POA: Diagnosis not present

## 2019-09-30 ENCOUNTER — Telehealth: Payer: Self-pay | Admitting: Cardiovascular Disease

## 2019-09-30 DIAGNOSIS — Z992 Dependence on renal dialysis: Secondary | ICD-10-CM | POA: Diagnosis not present

## 2019-09-30 DIAGNOSIS — N186 End stage renal disease: Secondary | ICD-10-CM | POA: Diagnosis not present

## 2019-09-30 DIAGNOSIS — N2581 Secondary hyperparathyroidism of renal origin: Secondary | ICD-10-CM | POA: Diagnosis not present

## 2019-10-02 DIAGNOSIS — N2581 Secondary hyperparathyroidism of renal origin: Secondary | ICD-10-CM | POA: Diagnosis not present

## 2019-10-02 DIAGNOSIS — Z992 Dependence on renal dialysis: Secondary | ICD-10-CM | POA: Diagnosis not present

## 2019-10-02 DIAGNOSIS — N186 End stage renal disease: Secondary | ICD-10-CM | POA: Diagnosis not present

## 2019-10-05 DIAGNOSIS — Z992 Dependence on renal dialysis: Secondary | ICD-10-CM | POA: Diagnosis not present

## 2019-10-05 DIAGNOSIS — N186 End stage renal disease: Secondary | ICD-10-CM | POA: Diagnosis not present

## 2019-10-05 DIAGNOSIS — N2581 Secondary hyperparathyroidism of renal origin: Secondary | ICD-10-CM | POA: Diagnosis not present

## 2019-10-06 ENCOUNTER — Telehealth: Payer: Self-pay

## 2019-10-06 NOTE — Telephone Encounter (Signed)
Paperwork has been received and placed in PCP box to be filled out.  Patient will be called once paperwork is ready for pick up.

## 2019-10-07 DIAGNOSIS — N2581 Secondary hyperparathyroidism of renal origin: Secondary | ICD-10-CM | POA: Diagnosis not present

## 2019-10-07 DIAGNOSIS — N186 End stage renal disease: Secondary | ICD-10-CM | POA: Diagnosis not present

## 2019-10-07 DIAGNOSIS — Z992 Dependence on renal dialysis: Secondary | ICD-10-CM | POA: Diagnosis not present

## 2019-10-07 NOTE — Telephone Encounter (Signed)
Patient was called and informed that paperwork is ready for pick up.  original copies has been placed in folder upfront for pick up  Copy has been placed in wall folder in nurse station.

## 2019-10-09 DIAGNOSIS — N186 End stage renal disease: Secondary | ICD-10-CM | POA: Diagnosis not present

## 2019-10-09 DIAGNOSIS — N2581 Secondary hyperparathyroidism of renal origin: Secondary | ICD-10-CM | POA: Diagnosis not present

## 2019-10-09 DIAGNOSIS — Z992 Dependence on renal dialysis: Secondary | ICD-10-CM | POA: Diagnosis not present

## 2019-10-12 DIAGNOSIS — N186 End stage renal disease: Secondary | ICD-10-CM | POA: Diagnosis not present

## 2019-10-12 DIAGNOSIS — Z992 Dependence on renal dialysis: Secondary | ICD-10-CM | POA: Diagnosis not present

## 2019-10-12 DIAGNOSIS — N2581 Secondary hyperparathyroidism of renal origin: Secondary | ICD-10-CM | POA: Diagnosis not present

## 2019-10-14 DIAGNOSIS — N2581 Secondary hyperparathyroidism of renal origin: Secondary | ICD-10-CM | POA: Diagnosis not present

## 2019-10-14 DIAGNOSIS — Z992 Dependence on renal dialysis: Secondary | ICD-10-CM | POA: Diagnosis not present

## 2019-10-14 DIAGNOSIS — N186 End stage renal disease: Secondary | ICD-10-CM | POA: Diagnosis not present

## 2019-10-16 DIAGNOSIS — Z992 Dependence on renal dialysis: Secondary | ICD-10-CM | POA: Diagnosis not present

## 2019-10-16 DIAGNOSIS — N186 End stage renal disease: Secondary | ICD-10-CM | POA: Diagnosis not present

## 2019-10-16 DIAGNOSIS — N2581 Secondary hyperparathyroidism of renal origin: Secondary | ICD-10-CM | POA: Diagnosis not present

## 2019-10-18 DIAGNOSIS — Z992 Dependence on renal dialysis: Secondary | ICD-10-CM | POA: Diagnosis not present

## 2019-10-18 DIAGNOSIS — N186 End stage renal disease: Secondary | ICD-10-CM | POA: Diagnosis not present

## 2019-10-18 DIAGNOSIS — E1122 Type 2 diabetes mellitus with diabetic chronic kidney disease: Secondary | ICD-10-CM | POA: Diagnosis not present

## 2019-10-19 DIAGNOSIS — N2581 Secondary hyperparathyroidism of renal origin: Secondary | ICD-10-CM | POA: Diagnosis not present

## 2019-10-19 DIAGNOSIS — N186 End stage renal disease: Secondary | ICD-10-CM | POA: Diagnosis not present

## 2019-10-19 DIAGNOSIS — Z992 Dependence on renal dialysis: Secondary | ICD-10-CM | POA: Diagnosis not present

## 2019-10-21 DIAGNOSIS — N2581 Secondary hyperparathyroidism of renal origin: Secondary | ICD-10-CM | POA: Diagnosis not present

## 2019-10-21 DIAGNOSIS — Z992 Dependence on renal dialysis: Secondary | ICD-10-CM | POA: Diagnosis not present

## 2019-10-21 DIAGNOSIS — N186 End stage renal disease: Secondary | ICD-10-CM | POA: Diagnosis not present

## 2019-10-23 DIAGNOSIS — N186 End stage renal disease: Secondary | ICD-10-CM | POA: Diagnosis not present

## 2019-10-23 DIAGNOSIS — Z992 Dependence on renal dialysis: Secondary | ICD-10-CM | POA: Diagnosis not present

## 2019-10-23 DIAGNOSIS — N2581 Secondary hyperparathyroidism of renal origin: Secondary | ICD-10-CM | POA: Diagnosis not present

## 2019-10-26 DIAGNOSIS — Z992 Dependence on renal dialysis: Secondary | ICD-10-CM | POA: Diagnosis not present

## 2019-10-26 DIAGNOSIS — N2581 Secondary hyperparathyroidism of renal origin: Secondary | ICD-10-CM | POA: Diagnosis not present

## 2019-10-26 DIAGNOSIS — N186 End stage renal disease: Secondary | ICD-10-CM | POA: Diagnosis not present

## 2019-10-28 DIAGNOSIS — N2581 Secondary hyperparathyroidism of renal origin: Secondary | ICD-10-CM | POA: Diagnosis not present

## 2019-10-28 DIAGNOSIS — N186 End stage renal disease: Secondary | ICD-10-CM | POA: Diagnosis not present

## 2019-10-28 DIAGNOSIS — Z992 Dependence on renal dialysis: Secondary | ICD-10-CM | POA: Diagnosis not present

## 2019-10-30 DIAGNOSIS — Z992 Dependence on renal dialysis: Secondary | ICD-10-CM | POA: Diagnosis not present

## 2019-10-30 DIAGNOSIS — N186 End stage renal disease: Secondary | ICD-10-CM | POA: Diagnosis not present

## 2019-10-30 DIAGNOSIS — N2581 Secondary hyperparathyroidism of renal origin: Secondary | ICD-10-CM | POA: Diagnosis not present

## 2019-11-01 MED FILL — CARVEDILOL 6.25 MG TABLET: 6.25 | 90 days supply | Qty: 180 | Fill #0

## 2019-11-02 DIAGNOSIS — Z992 Dependence on renal dialysis: Secondary | ICD-10-CM | POA: Diagnosis not present

## 2019-11-02 DIAGNOSIS — N186 End stage renal disease: Secondary | ICD-10-CM | POA: Diagnosis not present

## 2019-11-02 DIAGNOSIS — N2581 Secondary hyperparathyroidism of renal origin: Secondary | ICD-10-CM | POA: Diagnosis not present

## 2019-11-03 ENCOUNTER — Other Ambulatory Visit: Payer: Self-pay | Admitting: Family Medicine

## 2019-11-03 ENCOUNTER — Other Ambulatory Visit: Payer: Self-pay

## 2019-11-03 ENCOUNTER — Ambulatory Visit: Payer: Medicare HMO | Attending: Family Medicine | Admitting: Family Medicine

## 2019-11-03 ENCOUNTER — Encounter: Payer: Self-pay | Admitting: Family Medicine

## 2019-11-03 VITALS — BP 118/70 | HR 57 | Ht 71.0 in | Wt 162.0 lb

## 2019-11-03 DIAGNOSIS — Z992 Dependence on renal dialysis: Secondary | ICD-10-CM

## 2019-11-03 DIAGNOSIS — L309 Dermatitis, unspecified: Secondary | ICD-10-CM

## 2019-11-03 DIAGNOSIS — I10 Essential (primary) hypertension: Secondary | ICD-10-CM | POA: Diagnosis not present

## 2019-11-03 DIAGNOSIS — N186 End stage renal disease: Secondary | ICD-10-CM

## 2019-11-03 DIAGNOSIS — E118 Type 2 diabetes mellitus with unspecified complications: Secondary | ICD-10-CM

## 2019-11-03 DIAGNOSIS — E1169 Type 2 diabetes mellitus with other specified complication: Secondary | ICD-10-CM

## 2019-11-03 DIAGNOSIS — I25119 Atherosclerotic heart disease of native coronary artery with unspecified angina pectoris: Secondary | ICD-10-CM

## 2019-11-03 DIAGNOSIS — Z794 Long term (current) use of insulin: Secondary | ICD-10-CM | POA: Diagnosis not present

## 2019-11-03 DIAGNOSIS — E0841 Diabetes mellitus due to underlying condition with diabetic mononeuropathy: Secondary | ICD-10-CM

## 2019-11-03 DIAGNOSIS — E1122 Type 2 diabetes mellitus with diabetic chronic kidney disease: Secondary | ICD-10-CM | POA: Diagnosis not present

## 2019-11-03 DIAGNOSIS — E785 Hyperlipidemia, unspecified: Secondary | ICD-10-CM

## 2019-11-03 DIAGNOSIS — N528 Other male erectile dysfunction: Secondary | ICD-10-CM

## 2019-11-03 LAB — POCT GLYCOSYLATED HEMOGLOBIN (HGB A1C): HbA1c, POC (controlled diabetic range): 7.3 % — AB (ref 0.0–7.0)

## 2019-11-03 LAB — GLUCOSE, POCT (MANUAL RESULT ENTRY): POC Glucose: 121 mg/dl — AB (ref 70–99)

## 2019-11-03 MED ORDER — INSULIN ASPART 100 UNIT/ML ~~LOC~~ SOLN: 0.0000 [IU] | Freq: Three times a day (TID) | SUBCUTANEOUS | 3 refills | Status: DC

## 2019-11-03 MED ORDER — MISC. DEVICES MISC: 0 refills | Status: DC

## 2019-11-03 MED ORDER — GABAPENTIN 300 MG PO CAPS: 300.0000 mg | ORAL_CAPSULE | Freq: Every day | ORAL | 1 refills | Status: DC

## 2019-11-03 MED ORDER — ATORVASTATIN CALCIUM 40 MG PO TABS: 40.0000 mg | ORAL_TABLET | Freq: Every day | ORAL | 1 refills | Status: DC

## 2019-11-03 MED ORDER — INSULIN GLARGINE 100 UNIT/ML ~~LOC~~ SOLN: 10.0000 [IU] | Freq: Two times a day (BID) | SUBCUTANEOUS | 6 refills | Status: DC

## 2019-11-03 MED ORDER — ACCU-CHEK GUIDE ME W/DEVICE KIT: PACK | 0 refills | Status: AC

## 2019-11-03 MED ORDER — TRIAMCINOLONE ACETONIDE 0.1 % EX CREA: 1.0000 "application " | TOPICAL_CREAM | Freq: Two times a day (BID) | CUTANEOUS | 2 refills | Status: AC

## 2019-11-03 MED ORDER — ACCU-CHEK GUIDE VI STRP: ORAL_STRIP | 12 refills | Status: DC

## 2019-11-03 MED ORDER — CARVEDILOL 6.25 MG PO TABS: 6.2500 mg | ORAL_TABLET | Freq: Two times a day (BID) | ORAL | 1 refills | Status: DC

## 2019-11-03 MED ORDER — TADALAFIL 10 MG PO TABS: 10.0000 mg | ORAL_TABLET | ORAL | 1 refills | Status: AC | PRN

## 2019-11-03 MED FILL — NovoLOG 100 UNIT/ML SOLN: 100 | 83 days supply | Qty: 30 | Fill #0

## 2019-11-03 MED FILL — ACCU-CHEK GUIDE ME W/DEVICE: W/DEVICE | 30 days supply | Qty: 1 | Fill #0

## 2019-11-03 MED FILL — LANTUS 100 UNITS/ML VIAL: 100 | 28 days supply | Qty: 10 | Fill #4

## 2019-11-03 MED FILL — ACCU-CHEK GUIDE TEST STRIP: 33 days supply | Qty: 100 | Fill #0

## 2019-11-03 MED FILL — TRIAMCINOLONE ACETONIDE 0.1: 0.1 | 14 days supply | Qty: 45 | Fill #0

## 2019-11-03 NOTE — Progress Notes (Signed)
Subjective:  Patient ID: Russell Price, male    DOB: Jun 17, 1960  Age: 59 y.o. MRN: [id number]  CC: Diabetes   HPI Russell Price is a72 year old male with a history of hypertension, type 2 diabetes mellitus (A1c7.3), diabetic neuropathy, blind in the right eye, end-stage renal disease (currently on hemodialysis on Tuesdays, Thursdays and Saturdays) here for follow-up visit. He complains of a chronic pruritic rash in his left cubital fossa for the last couple of weeks but denies presence of rash in other body parts.  He denies introduction of new soaps or creams and has no known allergies to foods.  His diabetes is stable and he denies hypoglycemic values.  Compliant with Lantus 10 units twice daily and he uses NovoLog as per sliding scale. His hemodialysis sessions are going well.  He informs me he was taken off the transplant list at [facility] due to abnormal cardiac cath results which revealed presence of coronary artery disease: Cardiac cath report from [facility]: LHC and coronary angiogram via R CFA for abnormal stress test pre-renal  transplant. Severe coronary calcifications. LM engaged, mild disease.   LAD has severe calcifications throughout entirety of vessel with  diffuse/obstructive disease in mid into distal segment immediately distal  to last diagonal, this is a large wrap around vessel supplying distal  inferior wall. LCx mild disease proximally, OM2 has moderate-borderline  disease, LPL has moderate disease. RCA engaged, codominant appearing with  severe calcification and borderline mid disease. AV crossed, LVEDP 10.   EBL < 5 cc, prelude sync for hemostasis, no specimens.   At this time, pt is asymptomatic from a cardiac perspective.   Revascularization would not modify surgical risk. Since he is  asymptomatic with relatively preserved EF in setting of ESRD, would  recommend medical therapy.   He is compliant  with his antihypertensive. He denies presence of chest pain or additional concerns. Past Medical History:  Diagnosis Date  . Back pain 03/12/2018  . CAD (coronary artery disease)    a. underwent cath in 02/2016 after an abnormal nuc which showed 2V CAD in RCA and OM3 with no good targets for PCI. Medical managment recommended.   . Chronic kidney disease   . Diabetes mellitus without complication ([place])    type II - followed by Dr Chalmers Cater   . Difficult intubation    ER intubation 2016  . ESRD (end stage renal disease) ([place])    Tues, Th, Sat [place]  . Fever 03/12/2018  . Glaucoma   . Hemodialysis patient [facility])    has been on dialysis since approx 10/2016   . Hypertension    followed by [facility]  . Stroke [facility])    patient denies on 02/19/2017     Past Surgical History:  Procedure Laterality Date  . A/V FISTULAGRAM Left 11/27/2016   Procedure: A/V Fistulagram;  Surgeon: Waynetta Sandy, MD;  Location: [place] [facility];  Service: Cardiovascular;  Laterality: Left;  . A/V FISTULAGRAM Left 05/07/2017   Procedure: A/V FISTULAGRAM;  Surgeon: Waynetta Sandy, MD;  Location: [place] [facility];  Service: Cardiovascular;  Laterality: Left;  . A/V FISTULAGRAM Left 07/14/2017   Procedure: A/V FISTULAGRAM;  Surgeon: Waynetta Sandy, MD;  Location: [place] [facility];  Service: Cardiovascular;  Laterality: Left;  . AV FISTULA PLACEMENT Left 03/12/2016   Procedure: Left Arm ARTERIOVENOUS (AV) FISTULA CREATION;  Surgeon: Waynetta Sandy, MD;  Location: [place];  Service: Vascular;  Laterality: Left;  . AV FISTULA PLACEMENT Left 07/18/2017   Procedure: INSERTION OF ARTERIOVENOUS (AV) GORE-TEX GRAFT ARM LEFT UPPER ARM;  Surgeon: Conrad [place], MD;  Location: [place];  Service: Vascular;  Laterality: Left;  . BASCILIC VEIN TRANSPOSITION Left 05/14/2016   Procedure: SECOND STAGE LEFT BASILIC VEIN TRANSPOSITION;  Surgeon: Waynetta Sandy,  MD;  Location: [place];  Service: Vascular;  Laterality: Left;  . BRONCHOSCOPY  02/14/2015   for pulm hemorrhage  . CARDIAC CATHETERIZATION N/A 03/04/2016   Procedure: Left Heart Cath and Coronary Angiography;  Surgeon: Leonie Man, MD;  Location: [place] [facility];  Service: Cardiovascular;  Laterality: N/A;  . COLONOSCOPY WITH PROPOFOL N/A 04/07/2017   Procedure: COLONOSCOPY WITH PROPOFOL;  Surgeon: Ronnette Juniper, MD;  Location: [place];  Service: Gastroenterology;  Laterality: N/A;  . DIALYSIS/PERMA CATHETER INSERTION Right 04/30/2019   Procedure: DIALYSIS/PERMA CATHETER INSERTION;  Surgeon: Elam Dutch, MD;  Location: [place] [facility];  Service: Cardiovascular;  Laterality: Right;  . EYE SURGERY Left   . INSERTION OF DIALYSIS CATHETER Right 11/08/2016   Procedure: INSERTION OF DIALYSIS CATHETER;  Surgeon: Rosetta Posner, MD;  Location: [place];  Service: Vascular;  Laterality: Right;  . IR PARACENTESIS  11/13/2016  . LIGATION OF ARTERIOVENOUS  FISTULA Left 07/18/2017   Procedure: LIGATION OF ARTERIOVENOUS  FISTULA;  Surgeon: Conrad [place], MD;  Location: [place];  Service: Vascular;  Laterality: Left;  . PERIPHERAL VASCULAR BALLOON ANGIOPLASTY Left 11/27/2016   Procedure: Peripheral Vascular Balloon Angioplasty;  Surgeon: Waynetta Sandy, MD;  Location: [place] [facility];  Service: Cardiovascular;  Laterality: Left;  ARM FISTULA  . PERIPHERAL VASCULAR BALLOON ANGIOPLASTY Left 05/07/2017   Procedure: PERIPHERAL VASCULAR BALLOON ANGIOPLASTY;  Surgeon: Waynetta Sandy, MD;  Location: [place] [facility];  Service: Cardiovascular;  Laterality: Left;    Family History  Problem Relation Age of Onset  . Diabetes Mother     Allergies  Allergen Reactions  . Other Rash    chlorhexidine    Outpatient Medications Prior to Visit  Medication Sig Dispense Refill  . ACCU-CHEK SOFTCLIX LANCETS lancets Use as instructed 3 times daily before meals and at bedtime. E11.9 100  each 12  . aspirin EC 81 MG tablet Take 81 mg by mouth daily.    Marland Kitchen b complex-vitamin c-folic acid (NEPHRO-VITE) 0.8 MG TABS tablet Take 1 tablet by mouth at bedtime.    . Blood Glucose Monitoring Suppl (ACCU-CHEK AVIVA) device Use as instructed 3 times daily before meals and at bedtime. E11.9 1 each 0  . glucose blood (ACCU-CHEK AVIVA PLUS) test strip Use 4 times daily before meals and at bedtime. E11.9 100 each 12  . Lancet Devices (ACCU-CHEK SOFTCLIX) lancets Use as instructed 3 times daily before meals and at bedtime. E11.9 1 each 12  . Misc. Devices MISC Tub transfer bench. Dx: acute discitis and low back pain 1 each 0  . RENAGEL 800 MG tablet Take 800-2,400 mg by mouth See admin instructions. Take 2400 mg by mouth 3 times daily with meals and take 800 mg by mouth twice daily with snacks    . timolol (BETIMOL) 0.5 % ophthalmic solution Place 1 drop into the left eye 2 (two) times daily.    Marland Kitchen atorvastatin (LIPITOR) 40 MG tablet Take 1 tablet (40 mg total) by mouth at bedtime. 30 tablet 3  . carvedilol (COREG) 6.25 MG tablet Take 1 tablet (6.25 mg total) by mouth 2 (two) times  daily. 30 tablet 3  . gabapentin (NEURONTIN) 300 MG capsule Take 1 capsule (300 mg total) by mouth daily. 90 capsule 1  . insulin aspart (NOVOLOG) 100 UNIT/ML injection 0-12 units 3 times daily before meals as well as per sliding scale (Patient taking differently: Inject 0-12 Units into the skin 3 (three) times daily with meals. per sliding scale) 10 mL 5  . insulin glargine (LANTUS) 100 UNIT/ML injection Inject 0.1 mLs (10 Units total) into the skin 2 (two) times daily. 30 mL 6  . Misc. Devices MISC Blood Pressure monitor. Dx- Hypertension. 1 each 0  . tadalafil (CIALIS) 10 MG tablet Take 1 tablet (10 mg total) by mouth every other day as needed for erectile dysfunction. 10 tablet 1  . triamcinolone cream (KENALOG) 0.1 % Apply 1 application topically 2 (two) times daily. 45 g 1   No facility-administered medications prior  to visit.     ROS Review of Systems  Constitutional: Negative for activity change and appetite change.  HENT: Negative for sinus pressure and sore throat.   Eyes: Negative for visual disturbance.  Respiratory: Negative for cough, chest tightness and shortness of breath.   Cardiovascular: Negative for chest pain and leg swelling.  Gastrointestinal: Negative for abdominal distention, abdominal pain, constipation and diarrhea.  Endocrine: Negative.   Genitourinary: Negative for dysuria.  Musculoskeletal: Negative for joint swelling and myalgias.  Skin: Positive for rash.  Allergic/Immunologic: Negative.   Neurological: Negative for weakness, light-headedness and numbness.  Psychiatric/Behavioral: Negative for dysphoric mood and suicidal ideas.    Objective:  BP 118/70   Pulse (!) 57   Ht _0  (1.803 m)   Wt 162 lb (73.5 kg)   SpO2 100%   BMI 22.59 kg/m   BP/Weight 11/03/2019 0/53/9767 07/22/1937  Systolic BP 902 409 -  Diastolic BP 70 80 -  Wt. (Lbs) 162 163 160  BMI 22.59 22.73 22.32      Physical Exam Constitutional:      Appearance: He is well-developed.  Neck:     Vascular: No JVD.  Cardiovascular:     Rate and Rhythm: Normal rate.     Heart sounds: Normal heart sounds. No murmur heard.   Pulmonary:     Effort: Pulmonary effort is normal.     Breath sounds: Normal breath sounds. No wheezing or rales.  Chest:     Chest wall: No tenderness.  Abdominal:     General: Bowel sounds are normal. There is no distension.     Palpations: Abdomen is soft. There is no mass.     Tenderness: There is no abdominal tenderness.  Musculoskeletal:        General: Normal range of motion.     Right lower leg: No edema.     Left lower leg: No edema.  Neurological:     Mental Status: He is alert and oriented to person, place, and time.  Psychiatric:        Mood and Affect: Mood normal.     CMP Latest Ref Rng & Units 08/04/2019 04/30/2019 04/03/2019  Glucose 65 - 99 mg/dL  129(H) - 128(H)  BUN 8 - 27 mg/dL 37(H) - 38(H)  Creatinine 0.76 - 1.27 mg/dL 5.41(H) - 6.92(H)  Sodium 134 - 144 mmol/L 135 139 135  Potassium 3.5 - 5.2 mmol/L 4.8 4.6 3.9  Chloride 96 - 106 mmol/L 92(L) - 94(L)  CO2 22 - 32 mmol/L - - 26  Calcium 8.6 - 10.2 mg/dL 9.5 - 8.9  Total  Protein 6.0 - 8.5 g/dL 8.0 - -  Total Bilirubin 0.0 - 1.2 mg/dL 0.5 - -  Alkaline Phos 39 - 117 IU/L 90 - -  AST 0 - 40 IU/L 20 - -  ALT 9 - 46 U/L - - -    Lipid Panel     Component Value Date/Time   CHOL 100 08/04/2019 0909   TRIG 97 08/04/2019 0909   HDL 46 08/04/2019 0909   CHOLHDL 2.2 08/04/2019 0909   CHOLHDL 2.4 10/09/2015 0951   VLDL 10 10/09/2015 0951   LDLCALC 35 08/04/2019 0909    CBC    Component Value Date/Time   WBC 5.2 08/04/2019 0909   WBC 5.6 04/03/2019 0549   RBC 4.09 (L) 08/04/2019 0909   RBC 2.99 (L) 04/03/2019 0549   HGB 13.2 08/04/2019 0909   HCT 38.1 08/04/2019 0909   PLT 111 (L) 08/04/2019 0909   MCV 93 08/04/2019 0909   MCH 32.3 08/04/2019 0909   MCH 32.4 04/03/2019 0549   MCHC 34.6 08/04/2019 0909   MCHC 34.6 04/03/2019 0549   RDW 15.3 08/04/2019 0909   LYMPHSABS 1.4 08/04/2019 0909   MONOABS 0.4 07/21/2017 2051   EOSABS 0.2 08/04/2019 0909   BASOSABS 0.0 08/04/2019 0909    Lab Results  Component Value Date   HGBA1C 7.3 (A) 11/03/2019    Assessment & Plan:  1. Type 2 diabetes mellitus with chronic kidney disease on chronic dialysis, with long-term current use of insulin (HCC) Controlled with A1c of 7.3 Will be less strict with glycemic control to prevent hypoglycemia Counseled on Diabetic diet, my plate method, 287 minutes of moderate intensity exercise/week Blood sugar logs with fasting goals of 80-120 mg/dl, random of less than 180 and in the event of sugars less than 60 mg/dl or greater than 400 mg/dl encouraged to notify the clinic. Advised on the need for annual eye exams, annual foot exams, Pneumonia vaccine. - POCT glucose (manual entry) - POCT  glycosylated hemoglobin (Hb A1C) - insulin glargine (LANTUS) 100 UNIT/ML injection; Inject 0.1 mLs (10 Units total) into the skin 2 (two) times daily.  Dispense: 30 mL; Refill: 6 - Blood Glucose Monitoring Suppl (ACCU-CHEK GUIDE ME) w/Device KIT; Use 3 times daily before meals.  Dispense: 1 kit; Refill: 0 - glucose blood (ACCU-CHEK GUIDE) test strip; Use as instructed 3 times daily before meals  Dispense: 100 each; Refill: 12  2. Other male erectile dysfunction Stable - tadalafil (CIALIS) 10 MG tablet; Take 1 tablet (10 mg total) by mouth every other day as needed for erectile dysfunction.  Dispense: 10 tablet; Refill: 1  3. Essential hypertension Controlled Counseled on blood pressure goal of less than 130/80, low-sodium, DASH diet, medication compliance, 150 minutes of moderate intensity exercise per week. Discussed medication compliance, adverse effects. - Misc. Devices MISC; Blood Pressure monitor. Dx- Hypertension.  Dispense: 1 each; Refill: 0 - carvedilol (COREG) 6.25 MG tablet; Take 1 tablet (6.25 mg total) by mouth 2 (two) times daily.  Dispense: 180 tablet; Refill: 1  4. DM (diabetes mellitus) with complications ([place]) See #1 above - insulin aspart (NOVOLOG) 100 UNIT/ML injection; Inject 0-12 Units into the skin 3 (three) times daily with meals. per sliding scale  Dispense: 30 mL; Refill: 3  5. Diabetic mononeuropathy associated with diabetes mellitus due to underlying condition (HCC) Stable - gabapentin (NEURONTIN) 300 MG capsule; Take 1 capsule (300 mg total) by mouth daily.  Dispense: 90 capsule; Refill: 1  6. Hyperlipidemia associated with type 2 diabetes  mellitus ([place]) Controlled Lipid panel today - atorvastatin (LIPITOR) 40 MG tablet; Take 1 tablet (40 mg total) by mouth at bedtime.  Dispense: 90 tablet; Refill: 1 - Lipid panel  7. Atherosclerosis of native coronary artery of native heart with angina pectoris [facility]) Asymptomatic Medical management as per cardiology  8.  Dermatitis Likely dermatitis related to chronic hemodialysis - triamcinolone cream (KENALOG) 0.1 %; Apply 1 application topically 2 (two) times daily.  Dispense: 45 g; Refill: 2    Meds ordered this encounter  Medications  . insulin glargine (LANTUS) 100 UNIT/ML injection    Sig: Inject 0.1 mLs (10 Units total) into the skin 2 (two) times daily.    Dispense:  30 mL    Refill:  6  . tadalafil (CIALIS) 10 MG tablet    Sig: Take 1 tablet (10 mg total) by mouth every other day as needed for erectile dysfunction.    Dispense:  10 tablet    Refill:  1  . Misc. Devices MISC    Sig: Blood Pressure monitor. Dx- Hypertension.    Dispense:  1 each    Refill:  0  . Blood Glucose Monitoring Suppl (ACCU-CHEK GUIDE ME) w/Device KIT    Sig: Use 3 times daily before meals.    Dispense:  1 kit    Refill:  0  . glucose blood (ACCU-CHEK GUIDE) test strip    Sig: Use as instructed 3 times daily before meals    Dispense:  100 each    Refill:  12  . insulin aspart (NOVOLOG) 100 UNIT/ML injection    Sig: Inject 0-12 Units into the skin 3 (three) times daily with meals. per sliding scale    Dispense:  30 mL    Refill:  3    As per sliding scale  . gabapentin (NEURONTIN) 300 MG capsule    Sig: Take 1 capsule (300 mg total) by mouth daily.    Dispense:  90 capsule    Refill:  1  . carvedilol (COREG) 6.25 MG tablet    Sig: Take 1 tablet (6.25 mg total) by mouth 2 (two) times daily.    Dispense:  180 tablet    Refill:  1  . atorvastatin (LIPITOR) 40 MG tablet    Sig: Take 1 tablet (40 mg total) by mouth at bedtime.    Dispense:  90 tablet    Refill:  1  . triamcinolone cream (KENALOG) 0.1 %    Sig: Apply 1 application topically 2 (two) times daily.    Dispense:  45 g    Refill:  2    Follow-up: Return in about 6 months (around 05/04/2020) for Chronic disease management.       Charlott Rakes, MD, FAAFP. [facility] and [place], [place]     11/03/2019, 10:00 AM

## 2019-11-03 NOTE — Patient Instructions (Signed)

## 2019-11-04 DIAGNOSIS — N2581 Secondary hyperparathyroidism of renal origin: Secondary | ICD-10-CM | POA: Diagnosis not present

## 2019-11-04 DIAGNOSIS — Z992 Dependence on renal dialysis: Secondary | ICD-10-CM | POA: Diagnosis not present

## 2019-11-04 DIAGNOSIS — N186 End stage renal disease: Secondary | ICD-10-CM | POA: Diagnosis not present

## 2019-11-04 LAB — LIPID PANEL
Chol/HDL Ratio: 3.2 ratio (ref 0.0–5.0)
Cholesterol, Total: 155 mg/dL (ref 100–199)
HDL: 49 mg/dL (ref >39)
LDL Chol Calc (NIH): 90 mg/dL (ref 0–99)
Triglycerides: 82 mg/dL (ref 0–149)
VLDL Cholesterol Cal: 16 mg/dL (ref 5–40)

## 2019-11-05 ENCOUNTER — Telehealth: Payer: Self-pay

## 2019-11-05 NOTE — Telephone Encounter (Signed)
-----   Message from Charlott Rakes, MD sent at 11/04/2019  5:28 PM EDT ----- Please inform the patient that labs are normal. Thank you.

## 2019-11-05 NOTE — Telephone Encounter (Signed)
Patient name and DOB has been verified Patient was informed of lab results. Patient had no questions.  

## 2019-11-06 DIAGNOSIS — N2581 Secondary hyperparathyroidism of renal origin: Secondary | ICD-10-CM | POA: Diagnosis not present

## 2019-11-06 DIAGNOSIS — N186 End stage renal disease: Secondary | ICD-10-CM | POA: Diagnosis not present

## 2019-11-06 DIAGNOSIS — Z992 Dependence on renal dialysis: Secondary | ICD-10-CM | POA: Diagnosis not present

## 2019-11-08 ENCOUNTER — Ambulatory Visit: Payer: Medicare HMO | Admitting: Family Medicine

## 2019-11-09 DIAGNOSIS — Z992 Dependence on renal dialysis: Secondary | ICD-10-CM | POA: Diagnosis not present

## 2019-11-09 DIAGNOSIS — N186 End stage renal disease: Secondary | ICD-10-CM | POA: Diagnosis not present

## 2019-11-09 DIAGNOSIS — N2581 Secondary hyperparathyroidism of renal origin: Secondary | ICD-10-CM | POA: Diagnosis not present

## 2019-11-11 DIAGNOSIS — Z992 Dependence on renal dialysis: Secondary | ICD-10-CM | POA: Diagnosis not present

## 2019-11-11 DIAGNOSIS — N2581 Secondary hyperparathyroidism of renal origin: Secondary | ICD-10-CM | POA: Diagnosis not present

## 2019-11-11 DIAGNOSIS — N186 End stage renal disease: Secondary | ICD-10-CM | POA: Diagnosis not present

## 2019-11-13 DIAGNOSIS — N186 End stage renal disease: Secondary | ICD-10-CM | POA: Diagnosis not present

## 2019-11-13 DIAGNOSIS — N2581 Secondary hyperparathyroidism of renal origin: Secondary | ICD-10-CM | POA: Diagnosis not present

## 2019-11-13 DIAGNOSIS — Z992 Dependence on renal dialysis: Secondary | ICD-10-CM | POA: Diagnosis not present

## 2019-11-16 DIAGNOSIS — Z992 Dependence on renal dialysis: Secondary | ICD-10-CM | POA: Diagnosis not present

## 2019-11-16 DIAGNOSIS — N186 End stage renal disease: Secondary | ICD-10-CM | POA: Diagnosis not present

## 2019-11-16 DIAGNOSIS — N2581 Secondary hyperparathyroidism of renal origin: Secondary | ICD-10-CM | POA: Diagnosis not present

## 2019-11-17 DIAGNOSIS — N186 End stage renal disease: Secondary | ICD-10-CM | POA: Diagnosis not present

## 2019-11-17 DIAGNOSIS — Z992 Dependence on renal dialysis: Secondary | ICD-10-CM | POA: Diagnosis not present

## 2019-11-17 DIAGNOSIS — E1122 Type 2 diabetes mellitus with diabetic chronic kidney disease: Secondary | ICD-10-CM | POA: Diagnosis not present

## 2019-11-18 ENCOUNTER — Emergency Department (HOSPITAL_COMMUNITY)
Admission: EM | Admit: 2019-11-18 | Discharge: 2019-11-18 | Disposition: A | Discharge status: Home / Self Care | Payer: Medicare Other | Attending: Emergency Medicine | Admitting: Emergency Medicine

## 2019-11-18 ENCOUNTER — Encounter (HOSPITAL_COMMUNITY): Payer: Self-pay | Admitting: Emergency Medicine

## 2019-11-18 DIAGNOSIS — Z794 Long term (current) use of insulin: Secondary | ICD-10-CM | POA: Insufficient documentation

## 2019-11-18 DIAGNOSIS — Z79899 Other long term (current) drug therapy: Secondary | ICD-10-CM | POA: Insufficient documentation

## 2019-11-18 DIAGNOSIS — Y929 Unspecified place or not applicable: Secondary | ICD-10-CM | POA: Diagnosis not present

## 2019-11-18 DIAGNOSIS — N186 End stage renal disease: Secondary | ICD-10-CM | POA: Diagnosis not present

## 2019-11-18 DIAGNOSIS — S41102A Unspecified open wound of left upper arm, initial encounter: Secondary | ICD-10-CM | POA: Diagnosis not present

## 2019-11-18 DIAGNOSIS — I2510 Atherosclerotic heart disease of native coronary artery without angina pectoris: Secondary | ICD-10-CM | POA: Insufficient documentation

## 2019-11-18 DIAGNOSIS — Z992 Dependence on renal dialysis: Secondary | ICD-10-CM | POA: Diagnosis not present

## 2019-11-18 DIAGNOSIS — Y999 Unspecified external cause status: Secondary | ICD-10-CM | POA: Insufficient documentation

## 2019-11-18 DIAGNOSIS — E1122 Type 2 diabetes mellitus with diabetic chronic kidney disease: Secondary | ICD-10-CM | POA: Diagnosis not present

## 2019-11-18 DIAGNOSIS — Z87891 Personal history of nicotine dependence: Secondary | ICD-10-CM | POA: Diagnosis not present

## 2019-11-18 DIAGNOSIS — I120 Hypertensive chronic kidney disease with stage 5 chronic kidney disease or end stage renal disease: Secondary | ICD-10-CM | POA: Diagnosis not present

## 2019-11-18 DIAGNOSIS — Y939 Activity, unspecified: Secondary | ICD-10-CM | POA: Insufficient documentation

## 2019-11-18 DIAGNOSIS — X58XXXA Exposure to other specified factors, initial encounter: Secondary | ICD-10-CM | POA: Diagnosis not present

## 2019-11-18 DIAGNOSIS — Z7982 Long term (current) use of aspirin: Secondary | ICD-10-CM | POA: Diagnosis not present

## 2019-11-18 LAB — CBC
HCT: 30.2 % — ABNORMAL LOW (ref 39.0–52.0)
Hemoglobin: 10.3 g/dL — ABNORMAL LOW (ref 13.0–17.0)
MCH: 32.9 pg (ref 26.0–34.0)
MCHC: 34.1 g/dL (ref 30.0–36.0)
MCV: 96.5 fL (ref 80.0–100.0)
Platelets: 111 10*3/uL — ABNORMAL LOW (ref 150–400)
RBC: 3.13 MIL/uL — ABNORMAL LOW (ref 4.22–5.81)
RDW: 13.6 % (ref 11.5–15.5)
WBC: 5.2 10*3/uL (ref 4.0–10.5)
nRBC: 0 % (ref 0.0–0.2)

## 2019-11-18 LAB — BASIC METABOLIC PANEL
Anion gap: 15 (ref 5–15)
BUN: 62 mg/dL — ABNORMAL HIGH (ref 8–23)
CO2: 27 mmol/L (ref 22–32)
Calcium: 9.6 mg/dL (ref 8.9–10.3)
Chloride: 91 mmol/L — ABNORMAL LOW (ref 98–111)
Creatinine, Ser: 8.72 mg/dL — ABNORMAL HIGH (ref 0.61–1.24)
GFR calc Af Amer: 7 mL/min — ABNORMAL LOW (ref >60)
GFR calc non Af Amer: 6 mL/min — ABNORMAL LOW (ref >60)
Glucose, Bld: 243 mg/dL — ABNORMAL HIGH (ref 70–99)
Potassium: 4.6 mmol/L (ref 3.5–5.1)
Sodium: 133 mmol/L — ABNORMAL LOW (ref 135–145)

## 2019-11-18 MED ORDER — MUPIROCIN CALCIUM 2 % EX CREA: 1.0000 "application " | TOPICAL_CREAM | Freq: Two times a day (BID) | CUTANEOUS | 0 refills | Status: AC

## 2019-11-18 NOTE — ED Provider Notes (Signed)
TIME SEEN: 6:35 PM  CHIEF COMPLAINT: Sent from dialysis due to wounds on his left upper extremity  HPI: Patient is a 59 year old male with history of end-stage renal disease on hemodialysis Tuesday, Thursday and Saturday who was sent to the emergency department by nursing staff at his dialysis center after they were unable to access his fistula secondary to 2 small wounds over that area.  He states they have been there for "a couple of days".  Denies any pain, swelling, redness, warmth, bleeding, drainage.  He has no headache, vision changes, chest pain, shortness of breath, fever.  Last dialysis was 2 days ago.  No other dialysis access. It appears fistula was placed in 2019 by Dr. Bridgett Larsson.  ROS: See HPI Constitutional: no fever  Eyes: no drainage  ENT: no runny nose   Cardiovascular:  no chest pain  Resp: no SOB  GI: no vomiting GU: no dysuria Integumentary: no rash  Allergy: no hives  Musculoskeletal: no leg swelling  Neurological: no slurred speech ROS otherwise negative  PAST MEDICAL HISTORY/PAST SURGICAL HISTORY:  Past Medical History:  Diagnosis Date  . Back pain 03/12/2018  . CAD (coronary artery disease)    a. underwent cath in 02/2016 after an abnormal nuc which showed 2V CAD in RCA and OM3 with no good targets for PCI. Medical managment recommended.   . Chronic kidney disease   . Diabetes mellitus without complication ([place])    type II - followed by Dr Chalmers Cater   . Difficult intubation    ER intubation 2016  . ESRD (end stage renal disease) ([place])    Tues, Th, Sat [place]  . Fever 03/12/2018  . Glaucoma   . Hemodialysis patient [facility])    has been on dialysis since approx 10/2016   . Hypertension    followed by [facility]  . Stroke [facility])    patient denies on 02/19/2017     MEDICATIONS:  Prior to Admission medications   Medication Sig Start Date End Date Taking? Authorizing Provider  ACCU-CHEK SOFTCLIX LANCETS lancets Use as instructed 3 times daily  before meals and at bedtime. E11.9 07/20/18   Charlott Rakes, MD  aspirin EC 81 MG tablet Take 81 mg by mouth daily.    [provider]  atorvastatin (LIPITOR) 40 MG tablet Take 1 tablet (40 mg total) by mouth at bedtime. 11/03/19   Charlott Rakes, MD  b complex-vitamin c-folic acid (NEPHRO-VITE) 0.8 MG TABS tablet Take 1 tablet by mouth at bedtime.    [provider]  Blood Glucose Monitoring Suppl (ACCU-CHEK AVIVA) device Use as instructed 3 times daily before meals and at bedtime. E11.9 07/20/18   Charlott Rakes, MD  Blood Glucose Monitoring Suppl (ACCU-CHEK GUIDE ME) w/Device KIT Use 3 times daily before meals. 11/03/19   Charlott Rakes, MD  carvedilol (COREG) 6.25 MG tablet Take 1 tablet (6.25 mg total) by mouth 2 (two) times daily. 11/03/19   Charlott Rakes, MD  gabapentin (NEURONTIN) 300 MG capsule Take 1 capsule (300 mg total) by mouth daily. 11/03/19   Charlott Rakes, MD  glucose blood (ACCU-CHEK AVIVA PLUS) test strip Use 4 times daily before meals and at bedtime. E11.9 02/11/19   Charlott Rakes, MD  glucose blood (ACCU-CHEK GUIDE) test strip Use as instructed 3 times daily before meals 11/03/19   Charlott Rakes, MD  insulin aspart (NOVOLOG) 100 UNIT/ML injection Inject 0-12 Units into the skin 3 (three) times daily with meals. per sliding scale 11/03/19   Newlin, Sumpter,  MD  insulin glargine (LANTUS) 100 UNIT/ML injection Inject 0.1 mLs (10 Units total) into the skin 2 (two) times daily. 11/03/19   Charlott Rakes, MD  Lancet Devices [facility]) lancets Use as instructed 3 times daily before meals and at bedtime. E11.9 05/07/17   Charlott Rakes, MD  Misc. Devices MISC Tub transfer bench. Dx: acute discitis and low back pain 04/20/18   Charlott Rakes, MD  Misc. Devices MISC Blood Pressure monitor. Dx- Hypertension. 11/03/19   Charlott Rakes, MD  RENAGEL 800 MG tablet Take 800-2,400 mg by mouth See admin instructions. Take 2400 mg by mouth 3 times daily with meals and  take 800 mg by mouth twice daily with snacks 05/23/17   [provider]  tadalafil (CIALIS) 10 MG tablet Take 1 tablet (10 mg total) by mouth every other day as needed for erectile dysfunction. 11/03/19   Charlott Rakes, MD  timolol (BETIMOL) 0.5 % ophthalmic solution Place 1 drop into the left eye 2 (two) times daily.    [provider]  triamcinolone cream (KENALOG) 0.1 % Apply 1 application topically 2 (two) times daily. 11/03/19   Charlott Rakes, MD    ALLERGIES:  Allergies  Allergen Reactions  . Other Rash    chlorhexidine    SOCIAL HISTORY:  Social History   Tobacco Use  . Smoking status: Former Smoker    Packs/day: 0.00    Years: 4.00    Pack years: 0.00    Quit date: 02/23/1983    Years since quitting: 36.7  . Smokeless tobacco: Never Used  Substance Use Topics  . Alcohol use: No    FAMILY HISTORY: Family History  Problem Relation Age of Onset  . Diabetes Mother     EXAM: BP (!) 160/73 (BP Location: Right Arm)   Pulse (!) 47   Temp 98.1 F (36.7 C) (Oral)   Resp 15   Ht '5\' 11"'  (1.803 m)   Wt 73.5 kg   SpO2 100%   BMI 22.59 kg/m  CONSTITUTIONAL: Alert and oriented and responds appropriately to questions. Well-appearing; well-nourished HEAD: Normocephalic EYES: Conjunctivae clear, pupils appear equal, EOM appear intact ENT: normal nose; moist mucous membranes NECK: Supple, normal ROM CARD: RRR; S1 and S2 appreciated; no murmurs, no clicks, no rubs, no gallops RESP: Normal chest excursion without splinting or tachypnea; breath sounds clear and equal bilaterally; no wheezes, no rhonchi, no rales, no hypoxia or respiratory distress, speaking full sentences ABD/GI: Normal bowel sounds; non-distended; soft, non-tender, no rebound, no guarding, no peritoneal signs, no hepatosplenomegaly BACK:  The back appears normal EXT: Normal ROM in all joints; no deformity noted, no edema; no cyanosis; fistula in the left upper extremity with good thrill, bruit  and is nontender to palpation without redness, warmth, swelling. Compartments in the left upper extremity are soft. Patient has a 2+ left radial pulse. Patient has 2 small superficial wounds without bleeding, drainage, surrounding redness or warmth, fluctuance or induration to the left upper extremity. See please see picture below. SKIN: Normal color for age and race; warm; no rash on exposed skin NEURO: Moves all extremities equally PSYCH: The patient's mood and manner are appropriate.   MEDICAL DECISION MAKING: Patient here with 2 very superficial wounds over his dialysis site. There is no sign of superimposed infection. No bleeding or drainage. He has normal thrill, bruit. No signs of arterial obstruction, DVT, compartment syndrome, cellulitis on exam. He denies any pain. Last dialysis was 2 days ago. He is not currently having any  symptoms. I discussed the case with Dr. Justin Mend, nephrology on-call. He states that he feels patient is safe to have this area cannulated and recommends that he go back to his dialysis center tomorrow. Discussed this with patient and wife at bedside. He does not need any further emergent intervention. Will discharge on Bactroban to help with any wound healing.  At this time, I do not feel there is any life-threatening condition present. I have reviewed, interpreted and discussed all results (EKG, imaging, lab, urine as appropriate) and exam findings with patient/family. I have reviewed nursing notes and appropriate previous records.  I feel the patient is safe to be discharged home without further emergent workup and can continue workup as an outpatient as needed. Discussed usual and customary return precautions. Patient/family verbalize understanding and are comfortable with this plan.  Outpatient follow-up has been provided as needed. All questions have been answered.     Patient gave verbal permission to utilize photo for medical documentation only. The image was not stored  on any personal device.     Alric Sheikhidris Leeper was evaluated in Emergency Department on 11/18/2019 for the symptoms described in the history of present illness. He was evaluated in the context of the global COVID-19 pandemic, which necessitated consideration that the patient might be at risk for infection with the SARS-CoV-2 virus that causes COVID-19. Institutional protocols and algorithms that pertain to the evaluation of patients at risk for COVID-19 are in a state of rapid change based on information released by regulatory bodies including the CDC and federal and state organizations. These policies and algorithms were followed during the patient's care in the ED.      Asante Ritacco, Delice Bison, DO 11/18/19 1902

## 2019-11-18 NOTE — ED Triage Notes (Signed)
Pt went to HD today and they were unable to do HD due to left arm sore. Pt states he has had the sore for a few days. Last HD Tuesday. Denies any CP or SOB.

## 2019-11-18 NOTE — ED Notes (Signed)
Patient verbalizes understanding of discharge instructions. Opportunity for questioning and answers were provided. Armband removed by staff, pt discharged from ED ambulatory.   

## 2019-11-18 NOTE — Discharge Instructions (Addendum)
You were evaluated in the emergency department for 2 superficial wounds to your dialysis site. These wounds do not appear infected and do not need emergent surgical evaluation. We discussed your case with Dr. Justin Mend, nephrologist on-call. He thinks that it is safe to cannulate your dialysis fistula and recommend that you go to dialysis tomorrow, Friday, July 2 for dialysis. He states that they CAN use this site for your dialysis access.  You do not meet criteria for emergent dialysis from the ED.

## 2019-11-19 DIAGNOSIS — N2581 Secondary hyperparathyroidism of renal origin: Secondary | ICD-10-CM | POA: Diagnosis not present

## 2019-11-19 DIAGNOSIS — Z992 Dependence on renal dialysis: Secondary | ICD-10-CM | POA: Diagnosis not present

## 2019-11-19 DIAGNOSIS — D689 Coagulation defect, unspecified: Secondary | ICD-10-CM | POA: Diagnosis not present

## 2019-11-19 DIAGNOSIS — D631 Anemia in chronic kidney disease: Secondary | ICD-10-CM | POA: Diagnosis not present

## 2019-11-19 DIAGNOSIS — E1129 Type 2 diabetes mellitus with other diabetic kidney complication: Secondary | ICD-10-CM | POA: Diagnosis not present

## 2019-11-19 DIAGNOSIS — N186 End stage renal disease: Secondary | ICD-10-CM | POA: Diagnosis not present

## 2019-11-19 MED FILL — MUPIROCIN 2% OINTMENT: 2 | 7 days supply | Qty: 22 | Fill #0

## 2019-11-20 DIAGNOSIS — D689 Coagulation defect, unspecified: Secondary | ICD-10-CM | POA: Diagnosis not present

## 2019-11-20 DIAGNOSIS — E1129 Type 2 diabetes mellitus with other diabetic kidney complication: Secondary | ICD-10-CM | POA: Diagnosis not present

## 2019-11-20 DIAGNOSIS — D631 Anemia in chronic kidney disease: Secondary | ICD-10-CM | POA: Diagnosis not present

## 2019-11-20 DIAGNOSIS — N186 End stage renal disease: Secondary | ICD-10-CM | POA: Diagnosis not present

## 2019-11-20 DIAGNOSIS — Z992 Dependence on renal dialysis: Secondary | ICD-10-CM | POA: Diagnosis not present

## 2019-11-20 DIAGNOSIS — N2581 Secondary hyperparathyroidism of renal origin: Secondary | ICD-10-CM | POA: Diagnosis not present

## 2019-11-23 ENCOUNTER — Telehealth: Payer: Self-pay

## 2019-11-23 DIAGNOSIS — Z992 Dependence on renal dialysis: Secondary | ICD-10-CM | POA: Diagnosis not present

## 2019-11-23 DIAGNOSIS — E1129 Type 2 diabetes mellitus with other diabetic kidney complication: Secondary | ICD-10-CM | POA: Diagnosis not present

## 2019-11-23 DIAGNOSIS — D631 Anemia in chronic kidney disease: Secondary | ICD-10-CM | POA: Diagnosis not present

## 2019-11-23 DIAGNOSIS — D689 Coagulation defect, unspecified: Secondary | ICD-10-CM | POA: Diagnosis not present

## 2019-11-23 DIAGNOSIS — N2581 Secondary hyperparathyroidism of renal origin: Secondary | ICD-10-CM | POA: Diagnosis not present

## 2019-11-23 DIAGNOSIS — N186 End stage renal disease: Secondary | ICD-10-CM | POA: Diagnosis not present

## 2019-11-23 NOTE — Telephone Encounter (Signed)
Triage call from nurse at [facility] called requesting an appt today or tomorrow. Reports pt has a decline in skin integrity of sores that are getting larger and deeper that are draining sanguineous fluid around his Lt upper arm AV graft. His graft was able to be accessed today but difficult to locate an area due to skin breakdown. Reports he went to ED on 7/1 for evaluation and reports was told to follow up with vascular.  No appts available with MD; scheduled pt to be seen in [facility] clinic on tomorrow.

## 2019-11-24 ENCOUNTER — Encounter: Payer: Self-pay | Admitting: Physician Assistant

## 2019-11-24 ENCOUNTER — Other Ambulatory Visit: Payer: Self-pay

## 2019-11-24 ENCOUNTER — Ambulatory Visit (INDEPENDENT_AMBULATORY_CARE_PROVIDER_SITE_OTHER): Payer: Medicare Other | Admitting: Physician Assistant

## 2019-11-24 VITALS — BP 126/67 | HR 51 | Temp 97.8°F | Resp 20 | Ht 71.0 in | Wt 162.8 lb

## 2019-11-24 DIAGNOSIS — N186 End stage renal disease: Secondary | ICD-10-CM

## 2019-11-24 DIAGNOSIS — Z992 Dependence on renal dialysis: Secondary | ICD-10-CM

## 2019-11-24 NOTE — Progress Notes (Signed)
History of Present Illness:  Patient is a 59 y.o. year old male who presents for evaluation of malfunctioning left AV graft 07/18/2017.  [place] placed 04/30/19 due to multiple skin erosions over AV graft.    Past Medical History:  Diagnosis Date   Back pain 03/12/2018   CAD (coronary artery disease)    a. underwent cath in 02/2016 after an abnormal nuc which showed 2V CAD in RCA and OM3 with no good targets for PCI. Medical managment recommended.    Chronic kidney disease    Diabetes mellitus without complication ([place])    type II - followed by Dr Chalmers Cater    Difficult intubation    ER intubation 2016   ESRD (end stage renal disease) ([place])    Tues, Th, Sat [place]   Fever 03/12/2018   Glaucoma    Hemodialysis patient [facility])    has been on dialysis since approx 10/2016    Hypertension    followed by [facility]   Stroke [facility])    patient denies on 02/19/2017     Past Surgical History:  Procedure Laterality Date   A/V FISTULAGRAM Left 11/27/2016   Procedure: A/V Fistulagram;  Surgeon: Waynetta Sandy, MD;  Location: [place] [facility];  Service: Cardiovascular;  Laterality: Left;   A/V FISTULAGRAM Left 05/07/2017   Procedure: A/V FISTULAGRAM;  Surgeon: Waynetta Sandy, MD;  Location: [place] [facility];  Service: Cardiovascular;  Laterality: Left;   A/V FISTULAGRAM Left 07/14/2017   Procedure: A/V FISTULAGRAM;  Surgeon: Waynetta Sandy, MD;  Location: [place] [facility];  Service: Cardiovascular;  Laterality: Left;   AV FISTULA PLACEMENT Left 03/12/2016   Procedure: Left Arm ARTERIOVENOUS (AV) FISTULA CREATION;  Surgeon: Waynetta Sandy, MD;  Location: [place];  Service: Vascular;  Laterality: Left;   AV FISTULA PLACEMENT Left 07/18/2017   Procedure: INSERTION OF ARTERIOVENOUS (AV) GORE-TEX GRAFT ARM LEFT UPPER ARM;  Surgeon: Conrad Copeland, MD;  Location: [place];  Service: Vascular;  Laterality: Left;   [place] Left 05/14/2016   Procedure: SECOND STAGE LEFT BASILIC VEIN TRANSPOSITION;  Surgeon: Waynetta Sandy, MD;  Location: [place];  Service: Vascular;  Laterality: Left;   BRONCHOSCOPY  02/14/2015   for pulm hemorrhage   CARDIAC CATHETERIZATION N/A 03/04/2016   Procedure: Left Heart Cath and Coronary Angiography;  Surgeon: Leonie Man, MD;  Location: [place] [facility];  Service: Cardiovascular;  Laterality: N/A;   COLONOSCOPY WITH PROPOFOL N/A 04/07/2017   Procedure: COLONOSCOPY WITH PROPOFOL;  Surgeon: Ronnette Juniper, MD;  Location: [place];  Service: Gastroenterology;  Laterality: N/A;   DIALYSIS/PERMA CATHETER INSERTION Right 04/30/2019   Procedure: DIALYSIS/PERMA CATHETER INSERTION;  Surgeon: Elam Dutch, MD;  Location: [place] [facility];  Service: Cardiovascular;  Laterality: Right;   EYE SURGERY Left    INSERTION OF DIALYSIS CATHETER Right 11/08/2016   Procedure: INSERTION OF DIALYSIS CATHETER;  Surgeon: Rosetta Posner, MD;  Location: [place];  Service: Vascular;  Laterality: Right;   IR PARACENTESIS  11/13/2016   LIGATION OF ARTERIOVENOUS  FISTULA Left 07/18/2017   Procedure: LIGATION OF ARTERIOVENOUS  FISTULA;  Surgeon: Conrad Triadelphia, MD;  Location: [place];  Service: Vascular;  Laterality: Left;   PERIPHERAL VASCULAR BALLOON ANGIOPLASTY Left 11/27/2016   Procedure: Peripheral Vascular Balloon Angioplasty;  Surgeon: Waynetta Sandy, MD;  Location: [place] [facility];  Service: Cardiovascular;  Laterality: Left;  ARM FISTULA  PERIPHERAL VASCULAR BALLOON ANGIOPLASTY Left 05/07/2017   Procedure: PERIPHERAL VASCULAR BALLOON ANGIOPLASTY;  Surgeon: Waynetta Sandy, MD;  Location: [place] [facility];  Service: Cardiovascular;  Laterality: Left;     Social History Social History   Tobacco Use   Smoking status: Former Smoker    Packs/day: 0.00    Years: 4.00    Pack years: 0.00    Quit date: 02/23/1983    Years since quitting: 36.7    Smokeless tobacco: Never Used  Scientific laboratory technician Use: Never used  Substance Use Topics   Alcohol use: No   Drug use: No    Family History Family History  Problem Relation Age of Onset   Diabetes Mother     Allergies  Allergies  Allergen Reactions   Other Rash    chlorhexidine     Current Outpatient Medications  Medication Sig Dispense Refill   ACCU-CHEK SOFTCLIX LANCETS lancets Use as instructed 3 times daily before meals and at bedtime. E11.9 100 each 12   aspirin EC 81 MG tablet Take 81 mg by mouth daily.     atorvastatin (LIPITOR) 40 MG tablet Take 1 tablet (40 mg total) by mouth at bedtime. 90 tablet 1   b complex-vitamin c-folic acid (NEPHRO-VITE) 0.8 MG TABS tablet Take 1 tablet by mouth at bedtime.     Blood Glucose Monitoring Suppl (ACCU-CHEK AVIVA) device Use as instructed 3 times daily before meals and at bedtime. E11.9 1 each 0   Blood Glucose Monitoring Suppl (ACCU-CHEK GUIDE ME) w/Device KIT Use 3 times daily before meals. 1 kit 0   carvedilol (COREG) 6.25 MG tablet Take 1 tablet (6.25 mg total) by mouth 2 (two) times daily. 180 tablet 1   doxercalciferol (HECTOROL) 0.5 MCG capsule Doxercalciferol (Hectorol)     gabapentin (NEURONTIN) 300 MG capsule Take 1 capsule (300 mg total) by mouth daily. 90 capsule 1   glucose blood (ACCU-CHEK AVIVA PLUS) test strip Use 4 times daily before meals and at bedtime. E11.9 100 each 12   glucose blood (ACCU-CHEK GUIDE) test strip Use as instructed 3 times daily before meals 100 each 12   Heparin & NaCl Lock Flush (HEPARIN SODIUM FLUSH IV) Heparin Sodium (Porcine) 1,000 Units/mL Catheter Lock Arterial     insulin aspart (NOVOLOG) 100 UNIT/ML injection Inject 0-12 Units into the skin 3 (three) times daily with meals. per sliding scale 30 mL 3   insulin glargine (LANTUS) 100 UNIT/ML injection Inject 0.1 mLs (10 Units total) into the skin 2 (two) times daily. 30 mL 6   iron sucrose in sodium chloride 0.9 % 100 mL  Iron Sucrose (Venofer)     Lancet Devices (ACCU-CHEK SOFTCLIX) lancets Use as instructed 3 times daily before meals and at bedtime. E11.9 1 each 12   Misc. Devices MISC Tub transfer bench. Dx: acute discitis and low back pain 1 each 0   Misc. Devices MISC Blood Pressure monitor. Dx- Hypertension. 1 each 0   mupirocin cream (BACTROBAN) 2 % Apply 1 application topically 2 (two) times daily. For one week 15 g 0   RENAGEL 800 MG tablet Take 800-2,400 mg by mouth See admin instructions. Take 2400 mg by mouth 3 times daily with meals and take 800 mg by mouth twice daily with snacks     tadalafil (CIALIS) 10 MG tablet Take 1 tablet (10 mg total) by mouth every other day as needed for erectile dysfunction. 10 tablet 1   timolol (BETIMOL) 0.5 % ophthalmic solution  Place 1 drop into the left eye 2 (two) times daily.     triamcinolone cream (KENALOG) 0.1 % Apply 1 application topically 2 (two) times daily. 45 g 2   No current facility-administered medications for this visit.    ROS:   General:  No weight loss, Fever, chills  HEENT: No recent headaches, no nasal bleeding, right eye visual changes, no sore throat  Neurologic: No dizziness, blackouts, seizures. No recent symptoms of stroke or mini- stroke. No recent episodes of slurred speech, or temporary blindness.  Cardiac: No recent episodes of chest pain/pressure, no shortness of breath at rest.  No shortness of breath with exertion.  Denies history of atrial fibrillation or irregular heartbeat  Vascular: No history of rest pain in feet.  No history of claudication.  No history of non-healing ulcer, No history of DVT   Pulmonary: No home oxygen, no productive cough, no hemoptysis,  No asthma or wheezing  Musculoskeletal:  [ ] Arthritis, [ ] Low back pain,  [ ] Joint pain  Hematologic:No history of hypercoagulable state.  No history of easy bleeding.  No history of anemia  Gastrointestinal: No hematochezia or melena,  No gastroesophageal  reflux, no trouble swallowing  Urinary: [ ] chronic Kidney disease, [x ] on HD - [ ] MWF or [ ] TTHS, [ ] Burning with urination, [ ] Frequent urination, [ ] Difficulty urinating;   Skin: No rashes  Psychological: No history of anxiety,  No history of depression   Physical Examination  Vitals:   11/24/19 0910  BP: 126/67  Pulse: (!) 51  Resp: 20  Temp: 97.8 F (36.6 C)  TempSrc: Temporal  SpO2: 100%  Weight: 162 lb 12.8 oz (73.8 kg)  Height: 5' 11" (1.803 m)    Body mass index is 22.71 kg/m.  General:  Alert and oriented, no acute distress HEENT: Normal Neck: No bruit or JVD Pulmonary: Clear to auscultation bilaterally Cardiac: Regular Rate and Rhythm without murmur Gastrointestinal: Soft, non-tender, non-distended, no mass, no scars Skin: No rash    Extremity Pulses:  2+ radial, brachial pulses bilaterally Musculoskeletal: No deformity or edema  Neurologic: Upper and lower extremity motor 5/5 and symmetric    ASSESSMENT:  ESRD left  AV graft with friable skin superficial ulcer x 2 Last extended bleeding episode 1.5 weeks ago.  He is having them stick away from the ulcerated areas.    He denise fever, chills or drainage from the wounds.  PLAN:  He wants to maintain the graft and not have a TDC placed.  He will ask the HD staff to stick away from the ulcers.  If he develops fever, chills or erythema he will call.  F/U PRN.  Roxy Horseman PA-C [facility] Office: [phone]  MD in clinic [place]

## 2019-11-25 DIAGNOSIS — D631 Anemia in chronic kidney disease: Secondary | ICD-10-CM | POA: Diagnosis not present

## 2019-11-25 DIAGNOSIS — E1129 Type 2 diabetes mellitus with other diabetic kidney complication: Secondary | ICD-10-CM | POA: Diagnosis not present

## 2019-11-25 DIAGNOSIS — D689 Coagulation defect, unspecified: Secondary | ICD-10-CM | POA: Diagnosis not present

## 2019-11-25 DIAGNOSIS — Z992 Dependence on renal dialysis: Secondary | ICD-10-CM | POA: Diagnosis not present

## 2019-11-25 DIAGNOSIS — N2581 Secondary hyperparathyroidism of renal origin: Secondary | ICD-10-CM | POA: Diagnosis not present

## 2019-11-25 DIAGNOSIS — N186 End stage renal disease: Secondary | ICD-10-CM | POA: Diagnosis not present

## 2019-11-27 DIAGNOSIS — N2581 Secondary hyperparathyroidism of renal origin: Secondary | ICD-10-CM | POA: Diagnosis not present

## 2019-11-27 DIAGNOSIS — N186 End stage renal disease: Secondary | ICD-10-CM | POA: Diagnosis not present

## 2019-11-27 DIAGNOSIS — E1129 Type 2 diabetes mellitus with other diabetic kidney complication: Secondary | ICD-10-CM | POA: Diagnosis not present

## 2019-11-27 DIAGNOSIS — D689 Coagulation defect, unspecified: Secondary | ICD-10-CM | POA: Diagnosis not present

## 2019-11-27 DIAGNOSIS — Z992 Dependence on renal dialysis: Secondary | ICD-10-CM | POA: Diagnosis not present

## 2019-11-27 DIAGNOSIS — D631 Anemia in chronic kidney disease: Secondary | ICD-10-CM | POA: Diagnosis not present

## 2019-11-30 DIAGNOSIS — D631 Anemia in chronic kidney disease: Secondary | ICD-10-CM | POA: Diagnosis not present

## 2019-11-30 DIAGNOSIS — N186 End stage renal disease: Secondary | ICD-10-CM | POA: Diagnosis not present

## 2019-11-30 DIAGNOSIS — N2581 Secondary hyperparathyroidism of renal origin: Secondary | ICD-10-CM | POA: Diagnosis not present

## 2019-11-30 DIAGNOSIS — D689 Coagulation defect, unspecified: Secondary | ICD-10-CM | POA: Diagnosis not present

## 2019-11-30 DIAGNOSIS — E1129 Type 2 diabetes mellitus with other diabetic kidney complication: Secondary | ICD-10-CM | POA: Diagnosis not present

## 2019-11-30 DIAGNOSIS — Z992 Dependence on renal dialysis: Secondary | ICD-10-CM | POA: Diagnosis not present

## 2019-12-01 MED FILL — ATORVASTATIN CALCIUM 40 MG: 40 | 90 days supply | Qty: 90 | Fill #0

## 2019-12-01 MED FILL — LANTUS 100 UNITS/ML VIAL: 100 | 28 days supply | Qty: 10 | Fill #5

## 2019-12-01 MED FILL — GABAPENTIN 300 MG CAPSULE: 300 | 90 days supply | Qty: 90 | Fill #0

## 2019-12-02 DIAGNOSIS — E1129 Type 2 diabetes mellitus with other diabetic kidney complication: Secondary | ICD-10-CM | POA: Diagnosis not present

## 2019-12-02 DIAGNOSIS — Z992 Dependence on renal dialysis: Secondary | ICD-10-CM | POA: Diagnosis not present

## 2019-12-02 DIAGNOSIS — N186 End stage renal disease: Secondary | ICD-10-CM | POA: Diagnosis not present

## 2019-12-02 DIAGNOSIS — D689 Coagulation defect, unspecified: Secondary | ICD-10-CM | POA: Diagnosis not present

## 2019-12-02 DIAGNOSIS — N2581 Secondary hyperparathyroidism of renal origin: Secondary | ICD-10-CM | POA: Diagnosis not present

## 2019-12-02 DIAGNOSIS — D631 Anemia in chronic kidney disease: Secondary | ICD-10-CM | POA: Diagnosis not present

## 2019-12-04 DIAGNOSIS — Z992 Dependence on renal dialysis: Secondary | ICD-10-CM | POA: Diagnosis not present

## 2019-12-04 DIAGNOSIS — D689 Coagulation defect, unspecified: Secondary | ICD-10-CM | POA: Diagnosis not present

## 2019-12-04 DIAGNOSIS — E1129 Type 2 diabetes mellitus with other diabetic kidney complication: Secondary | ICD-10-CM | POA: Diagnosis not present

## 2019-12-04 DIAGNOSIS — N2581 Secondary hyperparathyroidism of renal origin: Secondary | ICD-10-CM | POA: Diagnosis not present

## 2019-12-04 DIAGNOSIS — N186 End stage renal disease: Secondary | ICD-10-CM | POA: Diagnosis not present

## 2019-12-04 DIAGNOSIS — D631 Anemia in chronic kidney disease: Secondary | ICD-10-CM | POA: Diagnosis not present

## 2019-12-07 DIAGNOSIS — N186 End stage renal disease: Secondary | ICD-10-CM | POA: Diagnosis not present

## 2019-12-07 DIAGNOSIS — D689 Coagulation defect, unspecified: Secondary | ICD-10-CM | POA: Diagnosis not present

## 2019-12-07 DIAGNOSIS — D631 Anemia in chronic kidney disease: Secondary | ICD-10-CM | POA: Diagnosis not present

## 2019-12-07 DIAGNOSIS — N2581 Secondary hyperparathyroidism of renal origin: Secondary | ICD-10-CM | POA: Diagnosis not present

## 2019-12-07 DIAGNOSIS — E1129 Type 2 diabetes mellitus with other diabetic kidney complication: Secondary | ICD-10-CM | POA: Diagnosis not present

## 2019-12-07 DIAGNOSIS — Z992 Dependence on renal dialysis: Secondary | ICD-10-CM | POA: Diagnosis not present

## 2019-12-09 DIAGNOSIS — Z992 Dependence on renal dialysis: Secondary | ICD-10-CM | POA: Diagnosis not present

## 2019-12-09 DIAGNOSIS — D631 Anemia in chronic kidney disease: Secondary | ICD-10-CM | POA: Diagnosis not present

## 2019-12-09 DIAGNOSIS — N2581 Secondary hyperparathyroidism of renal origin: Secondary | ICD-10-CM | POA: Diagnosis not present

## 2019-12-09 DIAGNOSIS — E1129 Type 2 diabetes mellitus with other diabetic kidney complication: Secondary | ICD-10-CM | POA: Diagnosis not present

## 2019-12-09 DIAGNOSIS — D689 Coagulation defect, unspecified: Secondary | ICD-10-CM | POA: Diagnosis not present

## 2019-12-09 DIAGNOSIS — N186 End stage renal disease: Secondary | ICD-10-CM | POA: Diagnosis not present

## 2019-12-10 DIAGNOSIS — H338 Other retinal detachments: Secondary | ICD-10-CM | POA: Diagnosis not present

## 2019-12-10 DIAGNOSIS — H2521 Age-related cataract, morgagnian type, right eye: Secondary | ICD-10-CM | POA: Diagnosis not present

## 2019-12-10 DIAGNOSIS — H3582 Retinal ischemia: Secondary | ICD-10-CM | POA: Diagnosis not present

## 2019-12-10 DIAGNOSIS — E113512 Type 2 diabetes mellitus with proliferative diabetic retinopathy with macular edema, left eye: Secondary | ICD-10-CM | POA: Diagnosis not present

## 2019-12-10 DIAGNOSIS — H5442A5 Blindness left eye category 5, normal vision right eye: Secondary | ICD-10-CM | POA: Diagnosis not present

## 2019-12-11 DIAGNOSIS — N2581 Secondary hyperparathyroidism of renal origin: Secondary | ICD-10-CM | POA: Diagnosis not present

## 2019-12-11 DIAGNOSIS — N186 End stage renal disease: Secondary | ICD-10-CM | POA: Diagnosis not present

## 2019-12-11 DIAGNOSIS — D689 Coagulation defect, unspecified: Secondary | ICD-10-CM | POA: Diagnosis not present

## 2019-12-11 DIAGNOSIS — E1129 Type 2 diabetes mellitus with other diabetic kidney complication: Secondary | ICD-10-CM | POA: Diagnosis not present

## 2019-12-11 DIAGNOSIS — Z992 Dependence on renal dialysis: Secondary | ICD-10-CM | POA: Diagnosis not present

## 2019-12-11 DIAGNOSIS — D631 Anemia in chronic kidney disease: Secondary | ICD-10-CM | POA: Diagnosis not present

## 2019-12-14 DIAGNOSIS — E1129 Type 2 diabetes mellitus with other diabetic kidney complication: Secondary | ICD-10-CM | POA: Diagnosis not present

## 2019-12-14 DIAGNOSIS — Z992 Dependence on renal dialysis: Secondary | ICD-10-CM | POA: Diagnosis not present

## 2019-12-14 DIAGNOSIS — N2581 Secondary hyperparathyroidism of renal origin: Secondary | ICD-10-CM | POA: Diagnosis not present

## 2019-12-14 DIAGNOSIS — D631 Anemia in chronic kidney disease: Secondary | ICD-10-CM | POA: Diagnosis not present

## 2019-12-14 DIAGNOSIS — D689 Coagulation defect, unspecified: Secondary | ICD-10-CM | POA: Diagnosis not present

## 2019-12-14 DIAGNOSIS — N186 End stage renal disease: Secondary | ICD-10-CM | POA: Diagnosis not present

## 2019-12-16 DIAGNOSIS — N186 End stage renal disease: Secondary | ICD-10-CM | POA: Diagnosis not present

## 2019-12-16 DIAGNOSIS — Z992 Dependence on renal dialysis: Secondary | ICD-10-CM | POA: Diagnosis not present

## 2019-12-16 DIAGNOSIS — N2581 Secondary hyperparathyroidism of renal origin: Secondary | ICD-10-CM | POA: Diagnosis not present

## 2019-12-16 DIAGNOSIS — D631 Anemia in chronic kidney disease: Secondary | ICD-10-CM | POA: Diagnosis not present

## 2019-12-16 DIAGNOSIS — D689 Coagulation defect, unspecified: Secondary | ICD-10-CM | POA: Diagnosis not present

## 2019-12-16 DIAGNOSIS — E1129 Type 2 diabetes mellitus with other diabetic kidney complication: Secondary | ICD-10-CM | POA: Diagnosis not present

## 2019-12-18 DIAGNOSIS — E1129 Type 2 diabetes mellitus with other diabetic kidney complication: Secondary | ICD-10-CM | POA: Diagnosis not present

## 2019-12-18 DIAGNOSIS — D689 Coagulation defect, unspecified: Secondary | ICD-10-CM | POA: Diagnosis not present

## 2019-12-18 DIAGNOSIS — Z992 Dependence on renal dialysis: Secondary | ICD-10-CM | POA: Diagnosis not present

## 2019-12-18 DIAGNOSIS — D631 Anemia in chronic kidney disease: Secondary | ICD-10-CM | POA: Diagnosis not present

## 2019-12-18 DIAGNOSIS — N186 End stage renal disease: Secondary | ICD-10-CM | POA: Diagnosis not present

## 2019-12-18 DIAGNOSIS — E1122 Type 2 diabetes mellitus with diabetic chronic kidney disease: Secondary | ICD-10-CM | POA: Diagnosis not present

## 2019-12-18 DIAGNOSIS — N2581 Secondary hyperparathyroidism of renal origin: Secondary | ICD-10-CM | POA: Diagnosis not present

## 2019-12-21 DIAGNOSIS — Z992 Dependence on renal dialysis: Secondary | ICD-10-CM | POA: Diagnosis not present

## 2019-12-21 DIAGNOSIS — N186 End stage renal disease: Secondary | ICD-10-CM | POA: Diagnosis not present

## 2019-12-21 DIAGNOSIS — N2581 Secondary hyperparathyroidism of renal origin: Secondary | ICD-10-CM | POA: Diagnosis not present

## 2019-12-23 DIAGNOSIS — N2581 Secondary hyperparathyroidism of renal origin: Secondary | ICD-10-CM | POA: Diagnosis not present

## 2019-12-23 DIAGNOSIS — Z992 Dependence on renal dialysis: Secondary | ICD-10-CM | POA: Diagnosis not present

## 2019-12-23 DIAGNOSIS — N186 End stage renal disease: Secondary | ICD-10-CM | POA: Diagnosis not present

## 2019-12-25 DIAGNOSIS — N186 End stage renal disease: Secondary | ICD-10-CM | POA: Diagnosis not present

## 2019-12-25 DIAGNOSIS — Z992 Dependence on renal dialysis: Secondary | ICD-10-CM | POA: Diagnosis not present

## 2019-12-25 DIAGNOSIS — N2581 Secondary hyperparathyroidism of renal origin: Secondary | ICD-10-CM | POA: Diagnosis not present

## 2019-12-28 DIAGNOSIS — N2581 Secondary hyperparathyroidism of renal origin: Secondary | ICD-10-CM | POA: Diagnosis not present

## 2019-12-28 DIAGNOSIS — Z992 Dependence on renal dialysis: Secondary | ICD-10-CM | POA: Diagnosis not present

## 2019-12-28 DIAGNOSIS — N186 End stage renal disease: Secondary | ICD-10-CM | POA: Diagnosis not present

## 2019-12-30 DIAGNOSIS — Z992 Dependence on renal dialysis: Secondary | ICD-10-CM | POA: Diagnosis not present

## 2019-12-30 DIAGNOSIS — N2581 Secondary hyperparathyroidism of renal origin: Secondary | ICD-10-CM | POA: Diagnosis not present

## 2019-12-30 DIAGNOSIS — N186 End stage renal disease: Secondary | ICD-10-CM | POA: Diagnosis not present

## 2020-01-01 DIAGNOSIS — Z992 Dependence on renal dialysis: Secondary | ICD-10-CM | POA: Diagnosis not present

## 2020-01-01 DIAGNOSIS — N186 End stage renal disease: Secondary | ICD-10-CM | POA: Diagnosis not present

## 2020-01-01 DIAGNOSIS — N2581 Secondary hyperparathyroidism of renal origin: Secondary | ICD-10-CM | POA: Diagnosis not present

## 2020-01-03 DIAGNOSIS — T82858A Stenosis of vascular prosthetic devices, implants and grafts, initial encounter: Secondary | ICD-10-CM | POA: Diagnosis not present

## 2020-01-03 DIAGNOSIS — I871 Compression of vein: Secondary | ICD-10-CM | POA: Diagnosis not present

## 2020-01-03 DIAGNOSIS — N186 End stage renal disease: Secondary | ICD-10-CM | POA: Diagnosis not present

## 2020-01-03 DIAGNOSIS — Z992 Dependence on renal dialysis: Secondary | ICD-10-CM | POA: Diagnosis not present

## 2020-01-04 DIAGNOSIS — N2581 Secondary hyperparathyroidism of renal origin: Secondary | ICD-10-CM | POA: Diagnosis not present

## 2020-01-04 DIAGNOSIS — N186 End stage renal disease: Secondary | ICD-10-CM | POA: Diagnosis not present

## 2020-01-04 DIAGNOSIS — Z992 Dependence on renal dialysis: Secondary | ICD-10-CM | POA: Diagnosis not present

## 2020-01-06 DIAGNOSIS — N186 End stage renal disease: Secondary | ICD-10-CM | POA: Diagnosis not present

## 2020-01-06 DIAGNOSIS — Z992 Dependence on renal dialysis: Secondary | ICD-10-CM | POA: Diagnosis not present

## 2020-01-06 DIAGNOSIS — N2581 Secondary hyperparathyroidism of renal origin: Secondary | ICD-10-CM | POA: Diagnosis not present

## 2020-01-08 DIAGNOSIS — N2581 Secondary hyperparathyroidism of renal origin: Secondary | ICD-10-CM | POA: Diagnosis not present

## 2020-01-08 DIAGNOSIS — Z992 Dependence on renal dialysis: Secondary | ICD-10-CM | POA: Diagnosis not present

## 2020-01-08 DIAGNOSIS — N186 End stage renal disease: Secondary | ICD-10-CM | POA: Diagnosis not present

## 2020-01-11 DIAGNOSIS — N2581 Secondary hyperparathyroidism of renal origin: Secondary | ICD-10-CM | POA: Diagnosis not present

## 2020-01-11 DIAGNOSIS — N186 End stage renal disease: Secondary | ICD-10-CM | POA: Diagnosis not present

## 2020-01-11 DIAGNOSIS — Z992 Dependence on renal dialysis: Secondary | ICD-10-CM | POA: Diagnosis not present

## 2020-01-13 DIAGNOSIS — Z992 Dependence on renal dialysis: Secondary | ICD-10-CM | POA: Diagnosis not present

## 2020-01-13 DIAGNOSIS — N2581 Secondary hyperparathyroidism of renal origin: Secondary | ICD-10-CM | POA: Diagnosis not present

## 2020-01-13 DIAGNOSIS — N186 End stage renal disease: Secondary | ICD-10-CM | POA: Diagnosis not present

## 2020-01-15 DIAGNOSIS — Z992 Dependence on renal dialysis: Secondary | ICD-10-CM | POA: Diagnosis not present

## 2020-01-15 DIAGNOSIS — N2581 Secondary hyperparathyroidism of renal origin: Secondary | ICD-10-CM | POA: Diagnosis not present

## 2020-01-15 DIAGNOSIS — N186 End stage renal disease: Secondary | ICD-10-CM | POA: Diagnosis not present

## 2020-01-18 DIAGNOSIS — E1122 Type 2 diabetes mellitus with diabetic chronic kidney disease: Secondary | ICD-10-CM | POA: Diagnosis not present

## 2020-01-18 DIAGNOSIS — Z992 Dependence on renal dialysis: Secondary | ICD-10-CM | POA: Diagnosis not present

## 2020-01-18 DIAGNOSIS — N186 End stage renal disease: Secondary | ICD-10-CM | POA: Diagnosis not present

## 2020-01-18 DIAGNOSIS — N2581 Secondary hyperparathyroidism of renal origin: Secondary | ICD-10-CM | POA: Diagnosis not present

## 2020-01-18 MED FILL — ACCU-CHEK GUIDE TEST STRIP: 33 days supply | Qty: 100 | Fill #1

## 2020-01-20 DIAGNOSIS — N2581 Secondary hyperparathyroidism of renal origin: Secondary | ICD-10-CM | POA: Diagnosis not present

## 2020-01-20 DIAGNOSIS — Z992 Dependence on renal dialysis: Secondary | ICD-10-CM | POA: Diagnosis not present

## 2020-01-20 DIAGNOSIS — N186 End stage renal disease: Secondary | ICD-10-CM | POA: Diagnosis not present

## 2020-01-22 DIAGNOSIS — Z992 Dependence on renal dialysis: Secondary | ICD-10-CM | POA: Diagnosis not present

## 2020-01-22 DIAGNOSIS — N186 End stage renal disease: Secondary | ICD-10-CM | POA: Diagnosis not present

## 2020-01-22 DIAGNOSIS — N2581 Secondary hyperparathyroidism of renal origin: Secondary | ICD-10-CM | POA: Diagnosis not present

## 2020-01-25 DIAGNOSIS — Z992 Dependence on renal dialysis: Secondary | ICD-10-CM | POA: Diagnosis not present

## 2020-01-25 DIAGNOSIS — N2581 Secondary hyperparathyroidism of renal origin: Secondary | ICD-10-CM | POA: Diagnosis not present

## 2020-01-25 DIAGNOSIS — N186 End stage renal disease: Secondary | ICD-10-CM | POA: Diagnosis not present

## 2020-01-26 DIAGNOSIS — E113512 Type 2 diabetes mellitus with proliferative diabetic retinopathy with macular edema, left eye: Secondary | ICD-10-CM | POA: Diagnosis not present

## 2020-01-27 DIAGNOSIS — N2581 Secondary hyperparathyroidism of renal origin: Secondary | ICD-10-CM | POA: Diagnosis not present

## 2020-01-27 DIAGNOSIS — N186 End stage renal disease: Secondary | ICD-10-CM | POA: Diagnosis not present

## 2020-01-27 DIAGNOSIS — Z992 Dependence on renal dialysis: Secondary | ICD-10-CM | POA: Diagnosis not present

## 2020-01-27 MED FILL — AMLODIPINE BESYLATE 5 MG TA: 5 | 90 days supply | Qty: 90 | Fill #0

## 2020-01-29 DIAGNOSIS — N186 End stage renal disease: Secondary | ICD-10-CM | POA: Diagnosis not present

## 2020-01-29 DIAGNOSIS — N2581 Secondary hyperparathyroidism of renal origin: Secondary | ICD-10-CM | POA: Diagnosis not present

## 2020-01-29 DIAGNOSIS — Z992 Dependence on renal dialysis: Secondary | ICD-10-CM | POA: Diagnosis not present

## 2020-02-01 DIAGNOSIS — Z992 Dependence on renal dialysis: Secondary | ICD-10-CM | POA: Diagnosis not present

## 2020-02-01 DIAGNOSIS — N186 End stage renal disease: Secondary | ICD-10-CM | POA: Diagnosis not present

## 2020-02-01 DIAGNOSIS — N2581 Secondary hyperparathyroidism of renal origin: Secondary | ICD-10-CM | POA: Diagnosis not present

## 2020-02-03 DIAGNOSIS — N2581 Secondary hyperparathyroidism of renal origin: Secondary | ICD-10-CM | POA: Diagnosis not present

## 2020-02-03 DIAGNOSIS — N186 End stage renal disease: Secondary | ICD-10-CM | POA: Diagnosis not present

## 2020-02-03 DIAGNOSIS — Z992 Dependence on renal dialysis: Secondary | ICD-10-CM | POA: Diagnosis not present

## 2020-02-05 DIAGNOSIS — N186 End stage renal disease: Secondary | ICD-10-CM | POA: Diagnosis not present

## 2020-02-05 DIAGNOSIS — Z992 Dependence on renal dialysis: Secondary | ICD-10-CM | POA: Diagnosis not present

## 2020-02-05 DIAGNOSIS — N2581 Secondary hyperparathyroidism of renal origin: Secondary | ICD-10-CM | POA: Diagnosis not present

## 2020-02-08 DIAGNOSIS — Z992 Dependence on renal dialysis: Secondary | ICD-10-CM | POA: Diagnosis not present

## 2020-02-08 DIAGNOSIS — N2581 Secondary hyperparathyroidism of renal origin: Secondary | ICD-10-CM | POA: Diagnosis not present

## 2020-02-08 DIAGNOSIS — N186 End stage renal disease: Secondary | ICD-10-CM | POA: Diagnosis not present

## 2020-02-10 DIAGNOSIS — Z992 Dependence on renal dialysis: Secondary | ICD-10-CM | POA: Diagnosis not present

## 2020-02-10 DIAGNOSIS — N2581 Secondary hyperparathyroidism of renal origin: Secondary | ICD-10-CM | POA: Diagnosis not present

## 2020-02-10 DIAGNOSIS — N186 End stage renal disease: Secondary | ICD-10-CM | POA: Diagnosis not present

## 2020-02-12 DIAGNOSIS — Z992 Dependence on renal dialysis: Secondary | ICD-10-CM | POA: Diagnosis not present

## 2020-02-12 DIAGNOSIS — N2581 Secondary hyperparathyroidism of renal origin: Secondary | ICD-10-CM | POA: Diagnosis not present

## 2020-02-12 DIAGNOSIS — N186 End stage renal disease: Secondary | ICD-10-CM | POA: Diagnosis not present

## 2020-02-14 DIAGNOSIS — Z87891 Personal history of nicotine dependence: Secondary | ICD-10-CM | POA: Diagnosis not present

## 2020-02-14 DIAGNOSIS — Z992 Dependence on renal dialysis: Secondary | ICD-10-CM | POA: Diagnosis not present

## 2020-02-14 DIAGNOSIS — Z7982 Long term (current) use of aspirin: Secondary | ICD-10-CM | POA: Diagnosis not present

## 2020-02-14 DIAGNOSIS — N186 End stage renal disease: Secondary | ICD-10-CM | POA: Diagnosis not present

## 2020-02-14 DIAGNOSIS — I2510 Atherosclerotic heart disease of native coronary artery without angina pectoris: Secondary | ICD-10-CM | POA: Diagnosis not present

## 2020-02-14 DIAGNOSIS — I1311 Hypertensive heart and chronic kidney disease without heart failure, with stage 5 chronic kidney disease, or end stage renal disease: Secondary | ICD-10-CM | POA: Diagnosis not present

## 2020-02-14 DIAGNOSIS — I120 Hypertensive chronic kidney disease with stage 5 chronic kidney disease or end stage renal disease: Secondary | ICD-10-CM | POA: Diagnosis not present

## 2020-02-15 DIAGNOSIS — N2581 Secondary hyperparathyroidism of renal origin: Secondary | ICD-10-CM | POA: Diagnosis not present

## 2020-02-15 DIAGNOSIS — Z992 Dependence on renal dialysis: Secondary | ICD-10-CM | POA: Diagnosis not present

## 2020-02-15 DIAGNOSIS — N186 End stage renal disease: Secondary | ICD-10-CM | POA: Diagnosis not present

## 2020-02-16 ENCOUNTER — Other Ambulatory Visit: Payer: Self-pay | Admitting: Family Medicine

## 2020-02-16 DIAGNOSIS — Z794 Long term (current) use of insulin: Secondary | ICD-10-CM

## 2020-02-16 NOTE — Telephone Encounter (Signed)
Requested medication (s) are due for refill today: Yes  Requested medication (s) are on the active medication list: Yes  Last refill:  02/11/19  Future visit scheduled: No  Notes to clinic:  Prescription has expired.    Requested Prescriptions  Pending Prescriptions Disp Refills   ACCU-CHEK AVIVA PLUS test strip [Pharmacy Med Name: Accu-Chek Aviva Plus In Vitro Strip] 100 each 0    Sig: USE 1 STRIP TO CHECK GLUCOSE BEFORE MEAL(S) AND AT BEDTIME      Endocrinology: Diabetes - Testing Supplies Passed - 02/16/2020  4:31 PM      Passed - Valid encounter within last 12 months    Recent Outpatient Visits           3 months ago Type 2 diabetes mellitus with chronic kidney disease on chronic dialysis, with long-term current use of insulin ([place])   [place], [place], MD   6 months ago Type 2 diabetes mellitus with chronic kidney disease on chronic dialysis, with long-term current use of insulin ([place])   [place], Cari S, PA-C   9 months ago Hyperlipidemia associated with type 2 diabetes mellitus ([place])   [facility] Charlott Rakes, MD   1 year ago Need for vaccination for Strep pneumoniae   [place], RPH-CPP   1 year ago Other male erectile dysfunction   [facility] Charlott Rakes, MD

## 2020-02-17 DIAGNOSIS — Z992 Dependence on renal dialysis: Secondary | ICD-10-CM | POA: Diagnosis not present

## 2020-02-17 DIAGNOSIS — N186 End stage renal disease: Secondary | ICD-10-CM | POA: Diagnosis not present

## 2020-02-17 DIAGNOSIS — N2581 Secondary hyperparathyroidism of renal origin: Secondary | ICD-10-CM | POA: Diagnosis not present

## 2020-02-17 DIAGNOSIS — E1122 Type 2 diabetes mellitus with diabetic chronic kidney disease: Secondary | ICD-10-CM | POA: Diagnosis not present

## 2020-02-17 MED FILL — LANTUS 100 UNITS/ML VIAL: 100 | 84 days supply | Qty: 30 | Fill #0

## 2020-02-17 MED FILL — ACCU-CHEK GUIDE TEST STRIP: 33 days supply | Qty: 100 | Fill #2

## 2020-02-17 MED FILL — NovoLOG 100 UNIT/ML SOLN: 100 | 83 days supply | Qty: 30 | Fill #1

## 2020-02-19 DIAGNOSIS — N2581 Secondary hyperparathyroidism of renal origin: Secondary | ICD-10-CM | POA: Diagnosis not present

## 2020-02-19 DIAGNOSIS — N186 End stage renal disease: Secondary | ICD-10-CM | POA: Diagnosis not present

## 2020-02-19 DIAGNOSIS — Z992 Dependence on renal dialysis: Secondary | ICD-10-CM | POA: Diagnosis not present

## 2020-02-22 DIAGNOSIS — Z992 Dependence on renal dialysis: Secondary | ICD-10-CM | POA: Diagnosis not present

## 2020-02-22 DIAGNOSIS — N186 End stage renal disease: Secondary | ICD-10-CM | POA: Diagnosis not present

## 2020-02-22 DIAGNOSIS — N2581 Secondary hyperparathyroidism of renal origin: Secondary | ICD-10-CM | POA: Diagnosis not present

## 2020-02-24 DIAGNOSIS — N186 End stage renal disease: Secondary | ICD-10-CM | POA: Diagnosis not present

## 2020-02-24 DIAGNOSIS — N2581 Secondary hyperparathyroidism of renal origin: Secondary | ICD-10-CM | POA: Diagnosis not present

## 2020-02-24 DIAGNOSIS — Z992 Dependence on renal dialysis: Secondary | ICD-10-CM | POA: Diagnosis not present

## 2020-02-24 MED FILL — ATORVASTATIN CALCIUM 40 MG: 40 | 90 days supply | Qty: 90 | Fill #1

## 2020-02-24 MED FILL — CARVEDILOL 6.25 MG TABLET: 6.25 | 90 days supply | Qty: 180 | Fill #0

## 2020-02-26 DIAGNOSIS — N186 End stage renal disease: Secondary | ICD-10-CM | POA: Diagnosis not present

## 2020-02-26 DIAGNOSIS — Z992 Dependence on renal dialysis: Secondary | ICD-10-CM | POA: Diagnosis not present

## 2020-02-26 DIAGNOSIS — N2581 Secondary hyperparathyroidism of renal origin: Secondary | ICD-10-CM | POA: Diagnosis not present

## 2020-02-28 MED FILL — GABAPENTIN 300 MG CAPSULE: 300 | 90 days supply | Qty: 90 | Fill #1

## 2020-02-29 DIAGNOSIS — Z992 Dependence on renal dialysis: Secondary | ICD-10-CM | POA: Diagnosis not present

## 2020-02-29 DIAGNOSIS — N2581 Secondary hyperparathyroidism of renal origin: Secondary | ICD-10-CM | POA: Diagnosis not present

## 2020-02-29 DIAGNOSIS — N186 End stage renal disease: Secondary | ICD-10-CM | POA: Diagnosis not present

## 2020-03-01 ENCOUNTER — Encounter: Payer: Self-pay | Admitting: Family Medicine

## 2020-03-01 ENCOUNTER — Other Ambulatory Visit: Payer: Self-pay

## 2020-03-01 ENCOUNTER — Ambulatory Visit: Payer: Medicare HMO | Attending: Family Medicine | Admitting: Family Medicine

## 2020-03-01 VITALS — BP 183/82 | HR 55 | Ht 71.0 in | Wt 166.0 lb

## 2020-03-01 DIAGNOSIS — E1122 Type 2 diabetes mellitus with diabetic chronic kidney disease: Secondary | ICD-10-CM

## 2020-03-01 DIAGNOSIS — E785 Hyperlipidemia, unspecified: Secondary | ICD-10-CM

## 2020-03-01 DIAGNOSIS — Z992 Dependence on renal dialysis: Secondary | ICD-10-CM

## 2020-03-01 DIAGNOSIS — E1169 Type 2 diabetes mellitus with other specified complication: Secondary | ICD-10-CM

## 2020-03-01 DIAGNOSIS — Z794 Long term (current) use of insulin: Secondary | ICD-10-CM | POA: Diagnosis not present

## 2020-03-01 DIAGNOSIS — I10 Essential (primary) hypertension: Secondary | ICD-10-CM | POA: Diagnosis not present

## 2020-03-01 DIAGNOSIS — N186 End stage renal disease: Secondary | ICD-10-CM | POA: Diagnosis not present

## 2020-03-01 DIAGNOSIS — I25119 Atherosclerotic heart disease of native coronary artery with unspecified angina pectoris: Secondary | ICD-10-CM | POA: Diagnosis not present

## 2020-03-01 DIAGNOSIS — I129 Hypertensive chronic kidney disease with stage 1 through stage 4 chronic kidney disease, or unspecified chronic kidney disease: Secondary | ICD-10-CM | POA: Diagnosis not present

## 2020-03-01 LAB — GLUCOSE, POCT (MANUAL RESULT ENTRY): POC Glucose: 95 mg/dl (ref 70–99)

## 2020-03-01 LAB — POCT GLYCOSYLATED HEMOGLOBIN (HGB A1C): HbA1c, POC (controlled diabetic range): 7.3 % — AB (ref 0.0–7.0)

## 2020-03-01 MED ORDER — MISC. DEVICES MISC: 0 refills | Status: AC

## 2020-03-01 NOTE — Patient Instructions (Signed)
Hypoglycemia Hypoglycemia is when the sugar (glucose) level in your blood is too low. Signs of low blood sugar may include:  Feeling: ? Hungry. ? Worried or nervous (anxious). ? Sweaty and clammy. ? Confused. ? Dizzy. ? Sleepy. ? Sick to your stomach (nauseous).  Having: ? A fast heartbeat. ? A headache. ? A change in your vision. ? Tingling or no feeling (numbness) around your mouth, lips, or tongue. ? Jerky movements that you cannot control (seizure).  Having trouble with: ? Moving (coordination). ? Sleeping. ? Passing out (fainting). ? Getting upset easily (irritability). Low blood sugar can happen to people who have diabetes and people who do not have diabetes. Low blood sugar can happen quickly, and it can be an emergency. Treating low blood sugar Low blood sugar is often treated by eating or drinking something sugary right away, such as:  Fruit juice, 4-6 oz (120-150 mL).  Regular soda (not diet soda), 4-6 oz (120-150 mL).  Low-fat milk, 4 oz (120 mL).  Several pieces of hard candy.  Sugar or honey, 1 Tbsp (15 mL). Treating low blood sugar if you have diabetes If you can think clearly and swallow safely, follow the 15:15 rule:  Take 15 grams of a fast-acting carb (carbohydrate). Talk with your doctor about how much you should take.  Always keep a source of fast-acting carb with you, such as: ? Sugar tablets (glucose pills). Take 3-4 pills. ? 6-8 pieces of hard candy. ? 4-6 oz (120-150 mL) of fruit juice. ? 4-6 oz (120-150 mL) of regular (not diet) soda. ? 1 Tbsp (15 mL) honey or sugar.  Check your blood sugar 15 minutes after you take the carb.  If your blood sugar is still at or below 70 mg/dL (3.9 mmol/L), take 15 grams of a carb again.  If your blood sugar does not go above 70 mg/dL (3.9 mmol/L) after 3 tries, get help right away.  After your blood sugar goes back to normal, eat a meal or a snack within 1 hour.  Treating very low blood sugar If your  blood sugar is at or below 54 mg/dL (3 mmol/L), you have very low blood sugar (severe hypoglycemia). This may also cause:  Passing out.  Jerky movements you cannot control (seizure).  Losing consciousness (coma). This is an emergency. Do not wait to see if the symptoms will go away. Get medical help right away. Call your local emergency services (911 in the [place] Do not drive yourself to the hospital. If you have very low blood sugar and you cannot eat or drink, you may need a glucagon shot (injection). A family member or friend should learn how to check your blood sugar and how to give you a glucagon shot. Ask your doctor if you need to have a glucagon shot kit at home. Follow these instructions at home: General instructions  Take over-the-counter and prescription medicines only as told by your doctor.  Stay aware of your blood sugar as told by your doctor.  Limit alcohol intake to no more than 1 drink a day for nonpregnant women and 2 drinks a day for men. One drink equals 12 oz of beer (355 mL), 5 oz of wine (148 mL), or 1 oz of hard liquor (44 mL).  Keep all follow-up visits as told by your doctor. This is important. If you have diabetes:   Follow your diabetes care plan as told by your doctor. Make sure you: ? Know the signs of low blood sugar. ?  Take your medicines as told. ? Follow your exercise and meal plan. ? Eat on time. Do not skip meals. ? Check your blood sugar as often as told by your doctor. Always check it before and after exercise. ? Follow your sick day plan when you cannot eat or drink normally. Make this plan ahead of time with your doctor.  Share your diabetes care plan with: ? Your work or school. ? People you live with.  Check your pee (urine) for ketones: ? When you are sick. ? As told by your doctor.  Carry a card or wear jewelry that says you have diabetes. Contact a doctor if:  You have trouble keeping your blood sugar in your target  range.  You have low blood sugar often. Get help right away if:  You still have symptoms after you eat or drink something sugary.  Your blood sugar is at or below 54 mg/dL (3 mmol/L).  You have jerky movements that you cannot control.  You pass out. These symptoms may be an emergency. Do not wait to see if the symptoms will go away. Get medical help right away. Call your local emergency services (911 in the [place] Do not drive yourself to the hospital. Summary  Hypoglycemia happens when the level of sugar (glucose) in your blood is too low.  Low blood sugar can happen to people who have diabetes and people who do not have diabetes. Low blood sugar can happen quickly, and it can be an emergency.  Make sure you know the signs of low blood sugar and know how to treat it.  Always keep a source of sugar (fast-acting carb) with you to treat low blood sugar. This information is not intended to replace advice given to you by your health care provider. Make sure you discuss any questions you have with your health care provider. Document Revised: 08/27/2018 Document Reviewed: 06/09/2015 Elsevier Patient Education  2020 Elsevier Inc.  

## 2020-03-01 NOTE — Progress Notes (Signed)
Name: Russell Price   MRN: [id number]    DOB: [birth year]   Date:03/01/2020       Progress Note  Subjective  Chief Complaint  Chief Complaint  Patient presents with  . Diabetes    HPI  Russell Price is a19 year old male with a history of hypertension, type 2 diabetes mellitus , diabetic neuropathy, blind in the right eye, end-stage renal disease (currently on hemodialysis on Tuesdays, Thursdays and Saturdays and has Left arm fistula) here for follow-up visit.  Diabetes: Reports he checked his blood sugars, 3 times daily. Reports his sugars are 70-90s in the morning and 150-200s in the evening. He is compliant with Lantus 10 units twice daily and uses Novolog per sliding scale. He denies any hypoglycemic events. He reports having a eye exam on 01/26/2020 and has another opthalmology appointment in November.   Hypertension: Reports his blood pressures are high before dialysis but low afterwards (he does not remember values). He reports eating a low-salt diet. He denies any chest pain or palpitations.  ESRD: He is compliant with dialysis. He is not a candidate for renal transplant due to his abnormal cardiac cath results. He saw cardiology on 02/17/2020 and surgical interventions were not recommended due to his high surgical risk and no cardiac symptoms. Medical management per cardiology. Cardiac catheterization 08/27/2019 LHC and coronary angiogram via R CFA for abnormal stress test pre-renal transplant. Severe coronary calcifications. LM engaged, mild disease. LAD has severe calcifications throughout entirety of vessel with diffuse/obstructive disease in mid into distal segment immediately distal to last diagonal, this is a large wrap around vessel supplying distal inferior wall. LCx mild disease proximally, OM2 has moderate-borderline disease, LPL has moderate disease. RCA engaged, codominant appearing with severe calcification and borderline mid disease. AV crossed, LVEDP  10. EBL < 5 cc, prelude sync for hemostasis, no specimens.   Patient declines influenza vaccine.  PHQ2/9: Depression screen [facility] 2/9 11/03/2019 07/20/2018 05/04/2018 04/20/2018 12/15/2017  Decreased Interest 0 0 0 2 0  Down, Depressed, Hopeless 0 0 0 0 0  PHQ - 2 Score 0 0 0 2 0  Altered sleeping 0 0 - 0 0  Tired, decreased energy 0 0 - 0 0  Change in appetite 0 0 - 0 -  Feeling bad or failure about yourself  0 0 - 0 0  Trouble concentrating 0 0 - 0 0  Moving slowly or fidgety/restless 0 0 - 0 0  Suicidal thoughts 0 0 - 0 0  PHQ-9 Score 0 0 - 2 0  Some recent data might be hidden    Patient Active Problem List   Diagnosis Date Noted  . Hemoptysis 03/31/2019  . Breast mass in male 05/04/2018  . Polypharmacy 04/07/2018  . Chronic back pain 03/13/2018  . Bilateral leg weakness 03/13/2018  . Hyponatremia 11/06/2016  . Fluid overload 11/06/2016  . CAD (coronary artery disease)   . ESRD (end stage renal disease) ([place])   . Diabetic neuropathy ([place]) 10/05/2015  . Left arm weakness   . Dyslipidemia 09/21/2015  . Labile blood pressure   . Labile blood glucose   . Diabetes mellitus type 2 in nonobese (HCC)   . Anemia of chronic disease   . Debility 09/08/2015  . Cerebral thrombosis with cerebral infarction ([place]) 02/18/2015  . Essential hypertension 11/11/2014  . DM (diabetes mellitus) with complications ([place]) 56/21/3086  . Blind right eye 11/11/2014    Past Medical History:  Diagnosis Date  . Back pain 03/12/2018  .  CAD (coronary artery disease)    a. underwent cath in 02/2016 after an abnormal nuc which showed 2V CAD in RCA and OM3 with no good targets for PCI. Medical managment recommended.   . Chronic kidney disease   . Diabetes mellitus without complication ([place])    type II - followed by Dr Chalmers Cater   . Difficult intubation    ER intubation 2016  . ESRD (end stage renal disease) ([place])    Tues, Th, Sat [place]  . Fever 03/12/2018  . Glaucoma   . Hemodialysis patient  [facility])    has been on dialysis since approx 10/2016   . Hypertension    followed by [facility]  . Stroke [facility])    patient denies on 02/19/2017     Past Surgical History:  Procedure Laterality Date  . A/V FISTULAGRAM Left 11/27/2016   Procedure: A/V Fistulagram;  Surgeon: Waynetta Sandy, MD;  Location: [place] [facility];  Service: Cardiovascular;  Laterality: Left;  . A/V FISTULAGRAM Left 05/07/2017   Procedure: A/V FISTULAGRAM;  Surgeon: Waynetta Sandy, MD;  Location: [place] [facility];  Service: Cardiovascular;  Laterality: Left;  . A/V FISTULAGRAM Left 07/14/2017   Procedure: A/V FISTULAGRAM;  Surgeon: Waynetta Sandy, MD;  Location: [place] [facility];  Service: Cardiovascular;  Laterality: Left;  . AV FISTULA PLACEMENT Left 03/12/2016   Procedure: Left Arm ARTERIOVENOUS (AV) FISTULA CREATION;  Surgeon: Waynetta Sandy, MD;  [facility] OR;  Service: Vascular;  Laterality: Left;  . AV FISTULA PLACEMENT Left 07/18/2017   Procedure: INSERTION OF ARTERIOVENOUS (AV) GORE-TEX GRAFT ARM LEFT UPPER ARM;  Surgeon: Conrad [place], MD;  Location: [place];  Service: Vascular;  Laterality: Left;  . BASCILIC VEIN TRANSPOSITION Left 05/14/2016   Procedure: SECOND STAGE LEFT BASILIC VEIN TRANSPOSITION;  Surgeon: Waynetta Sandy, MD;  Location: [place];  Service: Vascular;  Laterality: Left;  . BRONCHOSCOPY  02/14/2015   for pulm hemorrhage  . CARDIAC CATHETERIZATION N/A 03/04/2016   Procedure: Left Heart Cath and Coronary Angiography;  Surgeon: Leonie Man, MD;  Location: [place] [facility];  Service: Cardiovascular;  Laterality: N/A;  . COLONOSCOPY WITH PROPOFOL N/A 04/07/2017   Procedure: COLONOSCOPY WITH PROPOFOL;  Surgeon: Ronnette Juniper, MD;  Location: [place];  Service: Gastroenterology;  Laterality: N/A;  . DIALYSIS/PERMA CATHETER INSERTION Right 04/30/2019   Procedure: DIALYSIS/PERMA CATHETER INSERTION;  Surgeon: Elam Dutch, MD;  Location: [place] [facility];  Service: Cardiovascular;  Laterality: Right;  . EYE SURGERY Left   . INSERTION OF DIALYSIS CATHETER Right 11/08/2016   Procedure: INSERTION OF DIALYSIS CATHETER;  Surgeon: Rosetta Posner, MD;  Location: [place];  Service: Vascular;  Laterality: Right;  . IR PARACENTESIS  11/13/2016  . LIGATION OF ARTERIOVENOUS  FISTULA Left 07/18/2017   Procedure: LIGATION OF ARTERIOVENOUS  FISTULA;  Surgeon: Conrad [place], MD;  Location: [place];  Service: Vascular;  Laterality: Left;  . PERIPHERAL VASCULAR BALLOON ANGIOPLASTY Left 11/27/2016   Procedure: Peripheral Vascular Balloon Angioplasty;  Surgeon: Waynetta Sandy, MD;  Location: [place] [facility];  Service: Cardiovascular;  Laterality: Left;  ARM FISTULA  . PERIPHERAL VASCULAR BALLOON ANGIOPLASTY Left 05/07/2017   Procedure: PERIPHERAL VASCULAR BALLOON ANGIOPLASTY;  Surgeon: Waynetta Sandy, MD;  Location: [place] [facility];  Service: Cardiovascular;  Laterality: Left;    Social History   Tobacco Use  . Smoking status: Former Smoker    Packs/day: 0.00  Years: 4.00    Pack years: 0.00    Quit date: 02/23/1983    Years since quitting: 37.0  . Smokeless tobacco: Never Used  Substance Use Topics  . Alcohol use: No     Current Outpatient Medications:  .  ACCU-CHEK SOFTCLIX LANCETS lancets, Use as instructed 3 times daily before meals and at bedtime. E11.9, Disp: 100 each, Rfl: 12 .  aspirin EC 81 MG tablet, Take 81 mg by mouth daily., Disp: , Rfl:  .  atorvastatin (LIPITOR) 40 MG tablet, Take 1 tablet (40 mg total) by mouth at bedtime., Disp: 90 tablet, Rfl: 1 .  b complex-vitamin c-folic acid (NEPHRO-VITE) 0.8 MG TABS tablet, Take 1 tablet by mouth at bedtime., Disp: , Rfl:  .  Blood Glucose Monitoring Suppl (ACCU-CHEK AVIVA) device, Use as instructed 3 times daily before meals and at bedtime. E11.9, Disp: 1 each, Rfl: 0 .  Blood Glucose Monitoring Suppl (ACCU-CHEK GUIDE ME) w/Device KIT, Use  3 times daily before meals., Disp: 1 kit, Rfl: 0 .  carvedilol (COREG) 6.25 MG tablet, Take 1 tablet (6.25 mg total) by mouth 2 (two) times daily., Disp: 180 tablet, Rfl: 1 .  doxercalciferol (HECTOROL) 0.5 MCG capsule, Doxercalciferol (Hectorol), Disp: , Rfl:  .  gabapentin (NEURONTIN) 300 MG capsule, Take 1 capsule (300 mg total) by mouth daily., Disp: 90 capsule, Rfl: 1 .  glucose blood (ACCU-CHEK AVIVA PLUS) test strip, USE 1 STRIP TO CHECK GLUCOSE BEFORE MEAL(S) AND AT BEDTIME, Disp: 100 each, Rfl: 2 .  Heparin & NaCl Lock Flush (HEPARIN SODIUM FLUSH IV), Heparin Sodium (Porcine) 1,000 Units/mL Catheter Lock Arterial, Disp: , Rfl:  .  insulin aspart (NOVOLOG) 100 UNIT/ML injection, Inject 0-12 Units into the skin 3 (three) times daily with meals. per sliding scale, Disp: 30 mL, Rfl: 3 .  insulin glargine (LANTUS) 100 UNIT/ML injection, Inject 0.1 mLs (10 Units total) into the skin 2 (two) times daily., Disp: 30 mL, Rfl: 6 .  iron sucrose in sodium chloride 0.9 % 100 mL, Iron Sucrose (Venofer), Disp: , Rfl:  .  Lancet Devices (ACCU-CHEK SOFTCLIX) lancets, Use as instructed 3 times daily before meals and at bedtime. E11.9, Disp: 1 each, Rfl: 12 .  Misc. Devices MISC, Tub transfer bench. Dx: acute discitis and low back pain, Disp: 1 each, Rfl: 0 .  Misc. Devices MISC, Blood Pressure monitor. Dx- Hypertension., Disp: 1 each, Rfl: 0 .  mupirocin cream (BACTROBAN) 2 %, Apply 1 application topically 2 (two) times daily. For one week, Disp: 15 g, Rfl: 0 .  RENAGEL 800 MG tablet, Take 800-2,400 mg by mouth See admin instructions. Take 2400 mg by mouth 3 times daily with meals and take 800 mg by mouth twice daily with snacks, Disp: , Rfl:  .  tadalafil (CIALIS) 10 MG tablet, Take 1 tablet (10 mg total) by mouth every other day as needed for erectile dysfunction., Disp: 10 tablet, Rfl: 1 .  timolol (BETIMOL) 0.5 % ophthalmic solution, Place 1 drop into the left eye 2 (two) times daily., Disp: , Rfl:  .   triamcinolone cream (KENALOG) 0.1 %, Apply 1 application topically 2 (two) times daily., Disp: 45 g, Rfl: 2  Allergies  Allergen Reactions  . Other Rash    chlorhexidine    Review of Systems  Constitutional: Negative for chills, fever and weight loss.  Eyes: Negative for pain and discharge.       Right eye blind  Respiratory: Negative for cough, shortness of breath and  wheezing.   Cardiovascular: Negative for chest pain, palpitations, orthopnea and leg swelling.  Gastrointestinal: Negative for abdominal pain, heartburn, nausea and vomiting.  Musculoskeletal: Negative for myalgias.  Neurological: Negative for dizziness, tingling, weakness and headaches.  Endo/Heme/Allergies: Does not bruise/bleed easily.  Psychiatric/Behavioral: Negative for depression and suicidal ideas. The patient is not nervous/anxious.       No other specific complaints in a complete review of systems (except as listed in HPI above).  Objective Vitals with BMI 03/01/2020 03/01/2020 11/24/2019  Height _0  - _1   Weight 166 lbs - 162 lbs 13 oz  BMI 29.19 - 16.60  Systolic 600 459 977  Diastolic 82 60 67  Pulse 55 - 51     Body mass index is 23.15 kg/m.  Nursing Note and Vital Signs reviewed.  Physical Exam Constitutional:      Appearance: Normal appearance.  HENT:     Head: Normocephalic.  Cardiovascular:     Rate and Rhythm: Regular rhythm. Bradycardia present.     Pulses: Normal pulses.          Carotid pulses are on the right side with bruit and on the left side with bruit. Pulmonary:     Effort: Pulmonary effort is normal. No respiratory distress.     Breath sounds: Normal breath sounds.  Abdominal:     General: Abdomen is flat. Bowel sounds are normal.     Palpations: Abdomen is soft.     Tenderness: There is no abdominal tenderness.  Musculoskeletal:     Cervical back: Normal range of motion.     Right lower leg: No edema.     Left lower leg: No edema.     Right foot: No  deformity or bunion.     Left foot: No deformity or bunion.  Feet:     Right foot:     Protective Sensation: 10 sites tested. 10 sites sensed.     Skin integrity: Skin integrity normal.     Toenail Condition: Right toenails are normal.     Left foot:     Protective Sensation: 10 sites tested. 10 sites sensed.     Skin integrity: Skin integrity normal.     Toenail Condition: Left toenails are normal.  Skin:    General: Skin is warm and dry.  Neurological:     General: No focal deficit present.     Mental Status: He is alert and oriented to person, place, and time.  Psychiatric:        Mood and Affect: Mood normal.        Behavior: Behavior normal.      Results for orders placed or performed in visit on 03/01/20 (from the past 48 hour(s))  POCT glucose (manual entry)     Status: Normal   Collection Time: 03/01/20  1:54 PM  Result Value Ref Range   POC Glucose 95 70 - 99 mg/dl   CBC    Component Value Date/Time   WBC 5.2 11/18/2019 1337   RBC 3.13 (L) 11/18/2019 1337   HGB 10.3 (L) 11/18/2019 1337   HGB 13.2 08/04/2019 0909   HCT 30.2 (L) 11/18/2019 1337   HCT 38.1 08/04/2019 0909   PLT 111 (L) 11/18/2019 1337   PLT 111 (L) 08/04/2019 0909   MCV 96.5 11/18/2019 1337   MCV 93 08/04/2019 0909   MCH 32.9 11/18/2019 1337   MCHC 34.1 11/18/2019 1337   RDW 13.6 11/18/2019 1337   RDW 15.3 08/04/2019 0909  LYMPHSABS 1.4 08/04/2019 0909   MONOABS 0.4 07/21/2017 2051   EOSABS 0.2 08/04/2019 0909   BASOSABS 0.0 08/04/2019 0909   CMP Latest Ref Rng & Units 11/18/2019 08/04/2019 04/30/2019  Glucose 70 - 99 mg/dL 243(H) 129(H) -  BUN 8 - 23 mg/dL 62(H) 37(H) -  Creatinine 0.61 - 1.24 mg/dL 8.72(H) 5.41(H) -  Sodium 135 - 145 mmol/L 133(L) 135 139  Potassium 3.5 - 5.1 mmol/L 4.6 4.8 4.6  Chloride 98 - 111 mmol/L 91(L) 92(L) -  CO2 22 - 32 mmol/L 27 - -  Calcium 8.9 - 10.3 mg/dL 9.6 9.5 -  Total Protein 6.0 - 8.5 g/dL - 8.0 -  Total Bilirubin 0.0 - 1.2 mg/dL - 0.5 -  Alkaline  Phos 39 - 117 IU/L - 90 -  AST 0 - 40 IU/L - 20 -  ALT 9 - 46 U/L - - -   Lipid Panel     Component Value Date/Time   CHOL 155 11/03/2019 0950   TRIG 82 11/03/2019 0950   HDL 49 11/03/2019 0950   CHOLHDL 3.2 11/03/2019 0950   CHOLHDL 2.4 10/09/2015 0951   VLDL 10 10/09/2015 0951   LDLCALC 90 11/03/2019 0950   LABVLDL 16 11/03/2019 0950   Diabetic Foot Exam - Simple   Simple Foot Form Diabetic Foot exam was performed with the following findings: Yes   Visual Inspection No deformities, no ulcerations, no other skin breakdown bilaterally: Yes Sensation Testing Intact to touch and monofilament testing bilaterally: Yes Pulse Check Posterior Tibialis and Dorsalis pulse intact bilaterally: Yes Comments     Assessment & Plan  1. Type 2 diabetes mellitus with chronic kidney disease on chronic dialysis, with long-term current use of insulin (HCC) Stable hemoglobin A1c 7.3 Discussed with patient goal pre-prandial blood sugars 80-120, and random blood sugar less than 180. Encouraged patient to continue to keep a log of blood sugars, and to notify for blood sugars less than 70 or greater than 400. Foot exam completed. Eye exam up to date. Counseled on low-carbohydrate diet, 150 minutes of moderate intensity exercise per week, and medication adherence. Continue hemodialysis per nephrology  - POCT glucose (manual entry) - POCT glycosylated hemoglobin (Hb A1C)  2. Hypertension in chronic kidney disease due to type 2 diabetes mellitus ([place]) Repeat blood pressure was 162/60. Will be less strict with blood pressure goal to avoid hypotension during dialysis.  Ordered blood pressure machine so patient can check blood pressures at home. Counseled on low-sodium diet, medication compliance, and 150 minutes of moderate intensity exercise per week.   3. Hyperlipidemia associated with type 2 diabetes mellitus ([place]) Stable. Lipid panel on 11/03/2019 was normal Continue atorvastatin Counseled  on low-cholesterol diet  4. Atherosclerosis of native coronary artery of native heart with angina pectoris [facility]) Patient asymptomatic Continue medical management as per cardiology at [facility].  5. Essential hypertension Refer to #2 - Misc. Devices MISC; Blood Pressure monitor. Dx- Hypertension.  Dispense: 1 each; Refill: 0  Return in about 3 months (around 06/01/2020) for Chronic disease management.   Evaluation and management procedures were performed by me with NP Student in attendance, note written by NP student under my supervision and collaboration. I have reviewed the note and I agree with the management and plan. Russell Price is stable from a diabetes standpoint but unfortunately does not qualify for renal transplant given CAD for which he is asymptomatic and no intervention recommended per cardiology.  He will continue his current regimen, risk factor modifications  along with lifestyle management.  Charlott Rakes, MD, FAAFP. [facility] and [place], [place]   03/01/2020, 3:25 PM

## 2020-03-02 DIAGNOSIS — N186 End stage renal disease: Secondary | ICD-10-CM | POA: Diagnosis not present

## 2020-03-02 DIAGNOSIS — N2581 Secondary hyperparathyroidism of renal origin: Secondary | ICD-10-CM | POA: Diagnosis not present

## 2020-03-02 DIAGNOSIS — Z992 Dependence on renal dialysis: Secondary | ICD-10-CM | POA: Diagnosis not present

## 2020-03-04 DIAGNOSIS — Z992 Dependence on renal dialysis: Secondary | ICD-10-CM | POA: Diagnosis not present

## 2020-03-04 DIAGNOSIS — N2581 Secondary hyperparathyroidism of renal origin: Secondary | ICD-10-CM | POA: Diagnosis not present

## 2020-03-04 DIAGNOSIS — N186 End stage renal disease: Secondary | ICD-10-CM | POA: Diagnosis not present

## 2020-03-07 DIAGNOSIS — Z992 Dependence on renal dialysis: Secondary | ICD-10-CM | POA: Diagnosis not present

## 2020-03-07 DIAGNOSIS — N2581 Secondary hyperparathyroidism of renal origin: Secondary | ICD-10-CM | POA: Diagnosis not present

## 2020-03-07 DIAGNOSIS — N186 End stage renal disease: Secondary | ICD-10-CM | POA: Diagnosis not present

## 2020-03-09 DIAGNOSIS — N186 End stage renal disease: Secondary | ICD-10-CM | POA: Diagnosis not present

## 2020-03-09 DIAGNOSIS — N2581 Secondary hyperparathyroidism of renal origin: Secondary | ICD-10-CM | POA: Diagnosis not present

## 2020-03-09 DIAGNOSIS — Z992 Dependence on renal dialysis: Secondary | ICD-10-CM | POA: Diagnosis not present

## 2020-03-11 DIAGNOSIS — Z992 Dependence on renal dialysis: Secondary | ICD-10-CM | POA: Diagnosis not present

## 2020-03-11 DIAGNOSIS — N2581 Secondary hyperparathyroidism of renal origin: Secondary | ICD-10-CM | POA: Diagnosis not present

## 2020-03-11 DIAGNOSIS — N186 End stage renal disease: Secondary | ICD-10-CM | POA: Diagnosis not present

## 2020-03-14 DIAGNOSIS — N186 End stage renal disease: Secondary | ICD-10-CM | POA: Diagnosis not present

## 2020-03-14 DIAGNOSIS — Z992 Dependence on renal dialysis: Secondary | ICD-10-CM | POA: Diagnosis not present

## 2020-03-14 DIAGNOSIS — N2581 Secondary hyperparathyroidism of renal origin: Secondary | ICD-10-CM | POA: Diagnosis not present

## 2020-03-16 DIAGNOSIS — N186 End stage renal disease: Secondary | ICD-10-CM | POA: Diagnosis not present

## 2020-03-16 DIAGNOSIS — N2581 Secondary hyperparathyroidism of renal origin: Secondary | ICD-10-CM | POA: Diagnosis not present

## 2020-03-16 DIAGNOSIS — Z992 Dependence on renal dialysis: Secondary | ICD-10-CM | POA: Diagnosis not present

## 2020-03-18 DIAGNOSIS — Z992 Dependence on renal dialysis: Secondary | ICD-10-CM | POA: Diagnosis not present

## 2020-03-18 DIAGNOSIS — N186 End stage renal disease: Secondary | ICD-10-CM | POA: Diagnosis not present

## 2020-03-18 DIAGNOSIS — N2581 Secondary hyperparathyroidism of renal origin: Secondary | ICD-10-CM | POA: Diagnosis not present

## 2020-03-19 DIAGNOSIS — Z992 Dependence on renal dialysis: Secondary | ICD-10-CM | POA: Diagnosis not present

## 2020-03-19 DIAGNOSIS — E1122 Type 2 diabetes mellitus with diabetic chronic kidney disease: Secondary | ICD-10-CM | POA: Diagnosis not present

## 2020-03-19 DIAGNOSIS — N186 End stage renal disease: Secondary | ICD-10-CM | POA: Diagnosis not present

## 2020-03-21 DIAGNOSIS — N186 End stage renal disease: Secondary | ICD-10-CM | POA: Diagnosis not present

## 2020-03-21 DIAGNOSIS — Z992 Dependence on renal dialysis: Secondary | ICD-10-CM | POA: Diagnosis not present

## 2020-03-21 DIAGNOSIS — N2581 Secondary hyperparathyroidism of renal origin: Secondary | ICD-10-CM | POA: Diagnosis not present

## 2020-03-23 DIAGNOSIS — Z992 Dependence on renal dialysis: Secondary | ICD-10-CM | POA: Diagnosis not present

## 2020-03-23 DIAGNOSIS — N186 End stage renal disease: Secondary | ICD-10-CM | POA: Diagnosis not present

## 2020-03-23 DIAGNOSIS — N2581 Secondary hyperparathyroidism of renal origin: Secondary | ICD-10-CM | POA: Diagnosis not present

## 2020-03-25 DIAGNOSIS — N2581 Secondary hyperparathyroidism of renal origin: Secondary | ICD-10-CM | POA: Diagnosis not present

## 2020-03-25 DIAGNOSIS — Z992 Dependence on renal dialysis: Secondary | ICD-10-CM | POA: Diagnosis not present

## 2020-03-25 DIAGNOSIS — N186 End stage renal disease: Secondary | ICD-10-CM | POA: Diagnosis not present

## 2020-03-28 DIAGNOSIS — Z992 Dependence on renal dialysis: Secondary | ICD-10-CM | POA: Diagnosis not present

## 2020-03-28 DIAGNOSIS — N2581 Secondary hyperparathyroidism of renal origin: Secondary | ICD-10-CM | POA: Diagnosis not present

## 2020-03-28 DIAGNOSIS — N186 End stage renal disease: Secondary | ICD-10-CM | POA: Diagnosis not present

## 2020-03-29 DIAGNOSIS — E113512 Type 2 diabetes mellitus with proliferative diabetic retinopathy with macular edema, left eye: Secondary | ICD-10-CM | POA: Diagnosis not present

## 2020-03-29 MED FILL — ACCU-CHEK GUIDE TEST STRIP: 33 days supply | Qty: 100 | Fill #3

## 2020-03-30 DIAGNOSIS — Z992 Dependence on renal dialysis: Secondary | ICD-10-CM | POA: Diagnosis not present

## 2020-03-30 DIAGNOSIS — N186 End stage renal disease: Secondary | ICD-10-CM | POA: Diagnosis not present

## 2020-03-30 DIAGNOSIS — N2581 Secondary hyperparathyroidism of renal origin: Secondary | ICD-10-CM | POA: Diagnosis not present

## 2020-04-01 DIAGNOSIS — N186 End stage renal disease: Secondary | ICD-10-CM | POA: Diagnosis not present

## 2020-04-01 DIAGNOSIS — N2581 Secondary hyperparathyroidism of renal origin: Secondary | ICD-10-CM | POA: Diagnosis not present

## 2020-04-01 DIAGNOSIS — Z992 Dependence on renal dialysis: Secondary | ICD-10-CM | POA: Diagnosis not present

## 2020-04-04 DIAGNOSIS — Z992 Dependence on renal dialysis: Secondary | ICD-10-CM | POA: Diagnosis not present

## 2020-04-04 DIAGNOSIS — N186 End stage renal disease: Secondary | ICD-10-CM | POA: Diagnosis not present

## 2020-04-04 DIAGNOSIS — N2581 Secondary hyperparathyroidism of renal origin: Secondary | ICD-10-CM | POA: Diagnosis not present

## 2020-04-06 DIAGNOSIS — N2581 Secondary hyperparathyroidism of renal origin: Secondary | ICD-10-CM | POA: Diagnosis not present

## 2020-04-06 DIAGNOSIS — N186 End stage renal disease: Secondary | ICD-10-CM | POA: Diagnosis not present

## 2020-04-06 DIAGNOSIS — Z992 Dependence on renal dialysis: Secondary | ICD-10-CM | POA: Diagnosis not present

## 2020-04-08 DIAGNOSIS — N2581 Secondary hyperparathyroidism of renal origin: Secondary | ICD-10-CM | POA: Diagnosis not present

## 2020-04-08 DIAGNOSIS — N186 End stage renal disease: Secondary | ICD-10-CM | POA: Diagnosis not present

## 2020-04-08 DIAGNOSIS — Z992 Dependence on renal dialysis: Secondary | ICD-10-CM | POA: Diagnosis not present

## 2020-04-10 DIAGNOSIS — N2581 Secondary hyperparathyroidism of renal origin: Secondary | ICD-10-CM | POA: Diagnosis not present

## 2020-04-10 DIAGNOSIS — Z992 Dependence on renal dialysis: Secondary | ICD-10-CM | POA: Diagnosis not present

## 2020-04-10 DIAGNOSIS — N186 End stage renal disease: Secondary | ICD-10-CM | POA: Diagnosis not present

## 2020-04-12 ENCOUNTER — Ambulatory Visit: Payer: Medicare Other | Admitting: Family Medicine

## 2020-04-12 DIAGNOSIS — N2581 Secondary hyperparathyroidism of renal origin: Secondary | ICD-10-CM | POA: Diagnosis not present

## 2020-04-12 DIAGNOSIS — Z992 Dependence on renal dialysis: Secondary | ICD-10-CM | POA: Diagnosis not present

## 2020-04-12 DIAGNOSIS — N186 End stage renal disease: Secondary | ICD-10-CM | POA: Diagnosis not present

## 2020-04-15 DIAGNOSIS — N2581 Secondary hyperparathyroidism of renal origin: Secondary | ICD-10-CM | POA: Diagnosis not present

## 2020-04-15 DIAGNOSIS — N186 End stage renal disease: Secondary | ICD-10-CM | POA: Diagnosis not present

## 2020-04-15 DIAGNOSIS — Z992 Dependence on renal dialysis: Secondary | ICD-10-CM | POA: Diagnosis not present

## 2020-04-18 DIAGNOSIS — Z992 Dependence on renal dialysis: Secondary | ICD-10-CM | POA: Diagnosis not present

## 2020-04-18 DIAGNOSIS — N186 End stage renal disease: Secondary | ICD-10-CM | POA: Diagnosis not present

## 2020-04-18 DIAGNOSIS — N2581 Secondary hyperparathyroidism of renal origin: Secondary | ICD-10-CM | POA: Diagnosis not present

## 2020-04-18 DIAGNOSIS — E1122 Type 2 diabetes mellitus with diabetic chronic kidney disease: Secondary | ICD-10-CM | POA: Diagnosis not present

## 2020-04-20 DIAGNOSIS — N186 End stage renal disease: Secondary | ICD-10-CM | POA: Diagnosis not present

## 2020-04-20 DIAGNOSIS — Z992 Dependence on renal dialysis: Secondary | ICD-10-CM | POA: Diagnosis not present

## 2020-04-20 DIAGNOSIS — N2581 Secondary hyperparathyroidism of renal origin: Secondary | ICD-10-CM | POA: Diagnosis not present

## 2020-04-24 DIAGNOSIS — N186 End stage renal disease: Secondary | ICD-10-CM | POA: Diagnosis not present

## 2020-04-24 DIAGNOSIS — Z992 Dependence on renal dialysis: Secondary | ICD-10-CM | POA: Diagnosis not present

## 2020-04-25 DIAGNOSIS — Z992 Dependence on renal dialysis: Secondary | ICD-10-CM | POA: Diagnosis not present

## 2020-04-25 DIAGNOSIS — N2581 Secondary hyperparathyroidism of renal origin: Secondary | ICD-10-CM | POA: Diagnosis not present

## 2020-04-25 DIAGNOSIS — N186 End stage renal disease: Secondary | ICD-10-CM | POA: Diagnosis not present

## 2020-04-27 DIAGNOSIS — N2581 Secondary hyperparathyroidism of renal origin: Secondary | ICD-10-CM | POA: Diagnosis not present

## 2020-04-27 DIAGNOSIS — N186 End stage renal disease: Secondary | ICD-10-CM | POA: Diagnosis not present

## 2020-04-27 DIAGNOSIS — Z992 Dependence on renal dialysis: Secondary | ICD-10-CM | POA: Diagnosis not present

## 2020-04-29 DIAGNOSIS — Z992 Dependence on renal dialysis: Secondary | ICD-10-CM | POA: Diagnosis not present

## 2020-04-29 DIAGNOSIS — N2581 Secondary hyperparathyroidism of renal origin: Secondary | ICD-10-CM | POA: Diagnosis not present

## 2020-04-29 DIAGNOSIS — N186 End stage renal disease: Secondary | ICD-10-CM | POA: Diagnosis not present

## 2020-05-02 DIAGNOSIS — N186 End stage renal disease: Secondary | ICD-10-CM | POA: Diagnosis not present

## 2020-05-02 DIAGNOSIS — Z992 Dependence on renal dialysis: Secondary | ICD-10-CM | POA: Diagnosis not present

## 2020-05-02 DIAGNOSIS — N2581 Secondary hyperparathyroidism of renal origin: Secondary | ICD-10-CM | POA: Diagnosis not present

## 2020-05-04 DIAGNOSIS — N186 End stage renal disease: Secondary | ICD-10-CM | POA: Diagnosis not present

## 2020-05-04 DIAGNOSIS — Z992 Dependence on renal dialysis: Secondary | ICD-10-CM | POA: Diagnosis not present

## 2020-05-04 DIAGNOSIS — N2581 Secondary hyperparathyroidism of renal origin: Secondary | ICD-10-CM | POA: Diagnosis not present

## 2020-05-06 DIAGNOSIS — N186 End stage renal disease: Secondary | ICD-10-CM | POA: Diagnosis not present

## 2020-05-06 DIAGNOSIS — Z992 Dependence on renal dialysis: Secondary | ICD-10-CM | POA: Diagnosis not present

## 2020-05-06 DIAGNOSIS — N2581 Secondary hyperparathyroidism of renal origin: Secondary | ICD-10-CM | POA: Diagnosis not present

## 2020-05-09 DIAGNOSIS — N2581 Secondary hyperparathyroidism of renal origin: Secondary | ICD-10-CM | POA: Diagnosis not present

## 2020-05-09 DIAGNOSIS — N186 End stage renal disease: Secondary | ICD-10-CM | POA: Diagnosis not present

## 2020-05-09 DIAGNOSIS — Z992 Dependence on renal dialysis: Secondary | ICD-10-CM | POA: Diagnosis not present

## 2020-05-11 DIAGNOSIS — Z992 Dependence on renal dialysis: Secondary | ICD-10-CM | POA: Diagnosis not present

## 2020-05-11 DIAGNOSIS — N186 End stage renal disease: Secondary | ICD-10-CM | POA: Diagnosis not present

## 2020-05-11 DIAGNOSIS — N2581 Secondary hyperparathyroidism of renal origin: Secondary | ICD-10-CM | POA: Diagnosis not present

## 2020-05-14 DIAGNOSIS — N2581 Secondary hyperparathyroidism of renal origin: Secondary | ICD-10-CM | POA: Diagnosis not present

## 2020-05-14 DIAGNOSIS — N186 End stage renal disease: Secondary | ICD-10-CM | POA: Diagnosis not present

## 2020-05-14 DIAGNOSIS — Z992 Dependence on renal dialysis: Secondary | ICD-10-CM | POA: Diagnosis not present

## 2020-05-16 DIAGNOSIS — N2581 Secondary hyperparathyroidism of renal origin: Secondary | ICD-10-CM | POA: Diagnosis not present

## 2020-05-16 DIAGNOSIS — Z992 Dependence on renal dialysis: Secondary | ICD-10-CM | POA: Diagnosis not present

## 2020-05-16 DIAGNOSIS — N186 End stage renal disease: Secondary | ICD-10-CM | POA: Diagnosis not present

## 2020-05-16 MED FILL — ACCU-CHEK GUIDE TEST STRIP: 33 days supply | Qty: 100 | Fill #4

## 2020-05-18 DIAGNOSIS — Z992 Dependence on renal dialysis: Secondary | ICD-10-CM | POA: Diagnosis not present

## 2020-05-18 DIAGNOSIS — N2581 Secondary hyperparathyroidism of renal origin: Secondary | ICD-10-CM | POA: Diagnosis not present

## 2020-05-18 DIAGNOSIS — N186 End stage renal disease: Secondary | ICD-10-CM | POA: Diagnosis not present

## 2020-05-19 DIAGNOSIS — Z992 Dependence on renal dialysis: Secondary | ICD-10-CM | POA: Diagnosis not present

## 2020-05-19 DIAGNOSIS — N186 End stage renal disease: Secondary | ICD-10-CM | POA: Diagnosis not present

## 2020-05-19 DIAGNOSIS — E1122 Type 2 diabetes mellitus with diabetic chronic kidney disease: Secondary | ICD-10-CM | POA: Diagnosis not present

## 2020-05-20 DIAGNOSIS — U071 COVID-19: Secondary | ICD-10-CM | POA: Insufficient documentation

## 2020-05-20 HISTORY — DX: COVID-19: U07.1

## 2020-05-21 DIAGNOSIS — N186 End stage renal disease: Secondary | ICD-10-CM | POA: Diagnosis not present

## 2020-05-21 DIAGNOSIS — Z992 Dependence on renal dialysis: Secondary | ICD-10-CM | POA: Diagnosis not present

## 2020-05-21 DIAGNOSIS — N2581 Secondary hyperparathyroidism of renal origin: Secondary | ICD-10-CM | POA: Diagnosis not present

## 2020-05-23 DIAGNOSIS — Z992 Dependence on renal dialysis: Secondary | ICD-10-CM | POA: Diagnosis not present

## 2020-05-23 DIAGNOSIS — N186 End stage renal disease: Secondary | ICD-10-CM | POA: Diagnosis not present

## 2020-05-23 DIAGNOSIS — N2581 Secondary hyperparathyroidism of renal origin: Secondary | ICD-10-CM | POA: Diagnosis not present

## 2020-05-25 DIAGNOSIS — Z992 Dependence on renal dialysis: Secondary | ICD-10-CM | POA: Diagnosis not present

## 2020-05-25 DIAGNOSIS — N186 End stage renal disease: Secondary | ICD-10-CM | POA: Diagnosis not present

## 2020-05-25 DIAGNOSIS — N2581 Secondary hyperparathyroidism of renal origin: Secondary | ICD-10-CM | POA: Diagnosis not present

## 2020-05-27 ENCOUNTER — Encounter (HOSPITAL_COMMUNITY): Payer: Self-pay | Admitting: Emergency Medicine

## 2020-05-27 ENCOUNTER — Emergency Department (HOSPITAL_COMMUNITY)
Admission: EM | Admit: 2020-05-27 | Discharge: 2020-05-27 | Disposition: A | Discharge status: Home / Self Care | Payer: Medicare HMO | Attending: Emergency Medicine | Admitting: Emergency Medicine

## 2020-05-27 ENCOUNTER — Other Ambulatory Visit: Payer: Self-pay

## 2020-05-27 DIAGNOSIS — Z7982 Long term (current) use of aspirin: Secondary | ICD-10-CM | POA: Diagnosis not present

## 2020-05-27 DIAGNOSIS — I2510 Atherosclerotic heart disease of native coronary artery without angina pectoris: Secondary | ICD-10-CM | POA: Diagnosis not present

## 2020-05-27 DIAGNOSIS — I120 Hypertensive chronic kidney disease with stage 5 chronic kidney disease or end stage renal disease: Secondary | ICD-10-CM | POA: Insufficient documentation

## 2020-05-27 DIAGNOSIS — U071 COVID-19: Secondary | ICD-10-CM | POA: Diagnosis not present

## 2020-05-27 DIAGNOSIS — E1122 Type 2 diabetes mellitus with diabetic chronic kidney disease: Secondary | ICD-10-CM | POA: Diagnosis not present

## 2020-05-27 DIAGNOSIS — Z79899 Other long term (current) drug therapy: Secondary | ICD-10-CM | POA: Insufficient documentation

## 2020-05-27 DIAGNOSIS — Z794 Long term (current) use of insulin: Secondary | ICD-10-CM | POA: Insufficient documentation

## 2020-05-27 DIAGNOSIS — R509 Fever, unspecified: Secondary | ICD-10-CM | POA: Diagnosis present

## 2020-05-27 DIAGNOSIS — Z992 Dependence on renal dialysis: Secondary | ICD-10-CM | POA: Insufficient documentation

## 2020-05-27 DIAGNOSIS — I10 Essential (primary) hypertension: Secondary | ICD-10-CM | POA: Diagnosis not present

## 2020-05-27 DIAGNOSIS — Z87891 Personal history of nicotine dependence: Secondary | ICD-10-CM | POA: Insufficient documentation

## 2020-05-27 DIAGNOSIS — N186 End stage renal disease: Secondary | ICD-10-CM | POA: Diagnosis not present

## 2020-05-27 DIAGNOSIS — N2581 Secondary hyperparathyroidism of renal origin: Secondary | ICD-10-CM | POA: Diagnosis not present

## 2020-05-27 LAB — BASIC METABOLIC PANEL
Anion gap: 18 — ABNORMAL HIGH (ref 5–15)
BUN: 12 mg/dL (ref 8–23)
CO2: 24 mmol/L (ref 22–32)
Calcium: 8.6 mg/dL — ABNORMAL LOW (ref 8.9–10.3)
Chloride: 97 mmol/L — ABNORMAL LOW (ref 98–111)
Creatinine, Ser: 3.18 mg/dL — ABNORMAL HIGH (ref 0.61–1.24)
GFR, Estimated: 21 mL/min — ABNORMAL LOW (ref >60)
Glucose, Bld: 106 mg/dL — ABNORMAL HIGH (ref 70–99)
Potassium: 3 mmol/L — ABNORMAL LOW (ref 3.5–5.1)
Sodium: 139 mmol/L (ref 135–145)

## 2020-05-27 LAB — RESP PANEL BY RT-PCR (FLU A&B, COVID) ARPGX2
Influenza A by PCR: NEGATIVE
Influenza B by PCR: NEGATIVE
SARS Coronavirus 2 by RT PCR: POSITIVE — AB

## 2020-05-27 LAB — CBC
HCT: 30.2 % — ABNORMAL LOW (ref 39.0–52.0)
Hemoglobin: 10 g/dL — ABNORMAL LOW (ref 13.0–17.0)
MCH: 31.4 pg (ref 26.0–34.0)
MCHC: 33.1 g/dL (ref 30.0–36.0)
MCV: 95 fL (ref 80.0–100.0)
Platelets: 100 10*3/uL — ABNORMAL LOW (ref 150–400)
RBC: 3.18 MIL/uL — ABNORMAL LOW (ref 4.22–5.81)
RDW: 13.8 % (ref 11.5–15.5)
WBC: 5.2 10*3/uL (ref 4.0–10.5)
nRBC: 0 % (ref 0.0–0.2)

## 2020-05-27 NOTE — Discharge Instructions (Addendum)
You have been evaluated in the Emergency Department. You are Covid positive.  You are to isolate and quarantine yourself.  Those who have had close contact with you should be tested and isolate. Please refer to the information attached with this packet.  Treat yourself symptomatically with over the counter medication as you would for a viral illness. Call your dialysis center to make them aware you are COVID positive. They will need to arrange special precautions for your dialysis going forward. If you have any worsening or severe symptoms, difficulty breathing, concern for your health please return to the emergency department.

## 2020-05-27 NOTE — ED Notes (Signed)
The pt  Appears not to understand why hes going home   He wants his wife to check in to have a test for covid

## 2020-05-27 NOTE — ED Provider Notes (Signed)
[facility] EMERGENCY DEPARTMENT Provider Note   CSN: [id number] Arrival date & time: 05/27/20  1746     History Chief Complaint  Patient presents with  . Cough  . Weakness    Russell Price is a 60 y.o. male.  HPI   60 year old male with past medical history of ESRD on HD every TRS presents to the emergency department with fatigue and reported fever.  Patient presented for his dialysis session today, he had a full session but at the end was noted to be febrile.  He admitted to a couple days of fatigue, congestion and was referred to the emergency room for evaluation.  Patient admits to decreased appetite, denies any headache, chest pain, cough, vomiting/diarrhea, swelling of his lower extremities beyond baseline.  He denies any sick contacts, he lives with his wife who is asymptomatic.  Past Medical History:  Diagnosis Date  . Back pain 03/12/2018  . CAD (coronary artery disease)    a. underwent cath in 02/2016 after an abnormal nuc which showed 2V CAD in RCA and OM3 with no good targets for PCI. Medical managment recommended.   . Chronic kidney disease   . Diabetes mellitus without complication ([place])    type II - followed by Dr Chalmers Cater   . Difficult intubation    ER intubation 2016  . ESRD (end stage renal disease) ([place])    Tues, Th, Sat [place]  . Fever 03/12/2018  . Glaucoma   . Hemodialysis patient [facility])    has been on dialysis since approx 10/2016   . Hypertension    followed by [facility]  . Stroke [facility])    patient denies on 02/19/2017     Patient Active Problem List   Diagnosis Date Noted  . Hemoptysis 03/31/2019  . Breast mass in male 05/04/2018  . Polypharmacy 04/07/2018  . Chronic back pain 03/13/2018  . Bilateral leg weakness 03/13/2018  . Hyponatremia 11/06/2016  . Fluid overload 11/06/2016  . CAD (coronary artery disease)   . ESRD (end stage renal disease) ([place])   . Diabetic neuropathy ([place]) 10/05/2015   . Left arm weakness   . Dyslipidemia 09/21/2015  . Labile blood pressure   . Labile blood glucose   . Diabetes mellitus type 2 in nonobese ([facility])   . Anemia of chronic disease   . Debility 09/08/2015  . Cerebral thrombosis with cerebral infarction ([place]) 02/18/2015  . Essential hypertension 11/11/2014  . DM (diabetes mellitus) with complications ([place]) 90/24/0973  . Blind right eye 11/11/2014    Past Surgical History:  Procedure Laterality Date  . A/V FISTULAGRAM Left 11/27/2016   Procedure: A/V Fistulagram;  Surgeon: Waynetta Sandy, MD;  Location: [place] CV LAB;  Service: Cardiovascular;  Laterality: Left;  . A/V FISTULAGRAM Left 05/07/2017   Procedure: A/V FISTULAGRAM;  Surgeon: Waynetta Sandy, MD;  Location: [place] CV LAB;  Service: Cardiovascular;  Laterality: Left;  . A/V FISTULAGRAM Left 07/14/2017   Procedure: A/V FISTULAGRAM;  Surgeon: Waynetta Sandy, MD;  Location: [place] CV LAB;  Service: Cardiovascular;  Laterality: Left;  . AV FISTULA PLACEMENT Left 03/12/2016   Procedure: Left Arm ARTERIOVENOUS (AV) FISTULA CREATION;  Surgeon: Waynetta Sandy, MD;  Location: [facility] OR;  Service: Vascular;  Laterality: Left;  . AV FISTULA PLACEMENT Left 07/18/2017   Procedure: INSERTION OF ARTERIOVENOUS (AV) GORE-TEX GRAFT ARM LEFT UPPER ARM;  Surgeon: Conrad Kenbridge, MD;  Location: [facility];  Service: Vascular;  Laterality:  Left;  . BASCILIC VEIN TRANSPOSITION Left 05/14/2016   Procedure: SECOND STAGE LEFT BASILIC VEIN TRANSPOSITION;  Surgeon: Waynetta Sandy, MD;  Location: [place];  Service: Vascular;  Laterality: Left;  . BRONCHOSCOPY  02/14/2015   for pulm hemorrhage  . CARDIAC CATHETERIZATION N/A 03/04/2016   Procedure: Left Heart Cath and Coronary Angiography;  Surgeon: Leonie Man, MD;  Location: [place] CV LAB;  Service: Cardiovascular;  Laterality: N/A;  . COLONOSCOPY WITH PROPOFOL N/A 04/07/2017   Procedure: COLONOSCOPY  WITH PROPOFOL;  Surgeon: Ronnette Juniper, MD;  Location: [facility];  Service: Gastroenterology;  Laterality: N/A;  . DIALYSIS/PERMA CATHETER INSERTION Right 04/30/2019   Procedure: DIALYSIS/PERMA CATHETER INSERTION;  Surgeon: Elam Dutch, MD;  Location: [place] CV LAB;  Service: Cardiovascular;  Laterality: Right;  . EYE SURGERY Left   . INSERTION OF DIALYSIS CATHETER Right 11/08/2016   Procedure: INSERTION OF DIALYSIS CATHETER;  Surgeon: Rosetta Posner, MD;  Location: [facility];  Service: Vascular;  Laterality: Right;  . IR PARACENTESIS  11/13/2016  . LIGATION OF ARTERIOVENOUS  FISTULA Left 07/18/2017   Procedure: LIGATION OF ARTERIOVENOUS  FISTULA;  Surgeon: Conrad Zoar, MD;  Location: [place];  Service: Vascular;  Laterality: Left;  . PERIPHERAL VASCULAR BALLOON ANGIOPLASTY Left 11/27/2016   Procedure: Peripheral Vascular Balloon Angioplasty;  Surgeon: Waynetta Sandy, MD;  Location: [place] CV LAB;  Service: Cardiovascular;  Laterality: Left;  ARM FISTULA  . PERIPHERAL VASCULAR BALLOON ANGIOPLASTY Left 05/07/2017   Procedure: PERIPHERAL VASCULAR BALLOON ANGIOPLASTY;  Surgeon: Waynetta Sandy, MD;  Location: [place] CV LAB;  Service: Cardiovascular;  Laterality: Left;       Family History  Problem Relation Age of Onset  . Diabetes Mother     Social History   Tobacco Use  . Smoking status: Former Smoker    Packs/day: 0.00    Years: 4.00    Pack years: 0.00    Quit date: 02/23/1983    Years since quitting: 37.2  . Smokeless tobacco: Never Used  Vaping Use  . Vaping Use: Never used  Substance Use Topics  . Alcohol use: No  . Drug use: No    Home Medications Prior to Admission medications   Medication Sig Start Date End Date Taking? Authorizing Provider  ACCU-CHEK SOFTCLIX LANCETS lancets Use as instructed 3 times daily before meals and at bedtime. E11.9 07/20/18   Charlott Rakes, MD  aspirin EC 81 MG tablet Take 81 mg by mouth daily.    [provider]  atorvastatin (LIPITOR) 40 MG tablet Take 1 tablet (40 mg total) by mouth at bedtime. 11/03/19   Charlott Rakes, MD  b complex-vitamin c-folic acid (NEPHRO-VITE) 0.8 MG TABS tablet Take 1 tablet by mouth at bedtime.    [provider]  Blood Glucose Monitoring Suppl (ACCU-CHEK AVIVA) device Use as instructed 3 times daily before meals and at bedtime. E11.9 07/20/18   Charlott Rakes, MD  Blood Glucose Monitoring Suppl (ACCU-CHEK GUIDE ME) w/Device KIT Use 3 times daily before meals. 11/03/19   Charlott Rakes, MD  carvedilol (COREG) 6.25 MG tablet Take 1 tablet (6.25 mg total) by mouth 2 (two) times daily. 11/03/19   Charlott Rakes, MD  doxercalciferol (HECTOROL) 0.5 MCG capsule Doxercalciferol (Hectorol) 08/03/19 08/01/20  [provider]  gabapentin (NEURONTIN) 300 MG capsule Take 1 capsule (300 mg total) by mouth daily. 11/03/19   Charlott Rakes, MD  glucose blood (ACCU-CHEK AVIVA PLUS) test strip USE 1 STRIP TO CHECK  GLUCOSE BEFORE MEAL(S) AND AT BEDTIME 02/18/20   Charlott Rakes, MD  Heparin & NaCl Lock Flush (HEPARIN SODIUM FLUSH IV) Heparin Sodium (Porcine) 1,000 Units/mL Catheter Lock Arterial 05/06/19 05/04/20  [provider]  insulin aspart (NOVOLOG) 100 UNIT/ML injection Inject 0-12 Units into the skin 3 (three) times daily with meals. per sliding scale 11/03/19   Charlott Rakes, MD  insulin glargine (LANTUS) 100 UNIT/ML injection Inject 0.1 mLs (10 Units total) into the skin 2 (two) times daily. 11/03/19   Charlott Rakes, MD  iron sucrose in sodium chloride 0.9 % 100 mL Iron Sucrose (Venofer) 10/21/19 10/19/20  [provider]  Lancet Devices [facility]) lancets Use as instructed 3 times daily before meals and at bedtime. E11.9 05/07/17   Charlott Rakes, MD  Misc. Devices MISC Tub transfer bench. Dx: acute discitis and low back pain 04/20/18   Charlott Rakes, MD  Misc. Devices MISC Blood Pressure monitor. Dx- Hypertension. 03/01/20    Charlott Rakes, MD  mupirocin cream (BACTROBAN) 2 % Apply 1 application topically 2 (two) times daily. For one week 11/18/19   Ward, Cyril Mourning N, DO  RENAGEL 800 MG tablet Take 800-2,400 mg by mouth See admin instructions. Take 2400 mg by mouth 3 times daily with meals and take 800 mg by mouth twice daily with snacks 05/23/17   [provider]  tadalafil (CIALIS) 10 MG tablet Take 1 tablet (10 mg total) by mouth every other day as needed for erectile dysfunction. 11/03/19   Charlott Rakes, MD  timolol (BETIMOL) 0.5 % ophthalmic solution Place 1 drop into the left eye 2 (two) times daily.    [provider]  triamcinolone cream (KENALOG) 0.1 % Apply 1 application topically 2 (two) times daily. 11/03/19   Charlott Rakes, MD    Allergies    Other  Review of Systems   Review of Systems  Constitutional: Positive for appetite change, fatigue and fever. Negative for chills.  HENT: Positive for rhinorrhea. Negative for congestion.   Eyes: Negative for visual disturbance.  Respiratory: Negative for cough, chest tightness and shortness of breath.   Cardiovascular: Negative for chest pain and leg swelling.  Gastrointestinal: Negative for abdominal pain, diarrhea and vomiting.  Genitourinary: Negative for dysuria.  Skin: Negative for rash.  Neurological: Negative for headaches.    Physical Exam Updated Vital Signs BP (!) 157/78   Pulse 66   Temp 98.8 F (37.1 C) (Oral)   Resp 20   SpO2 100%   Physical Exam Vitals and nursing note reviewed.  Constitutional:      Appearance: Normal appearance.  HENT:     Head: Normocephalic.     Mouth/Throat:     Mouth: Mucous membranes are moist.  Eyes:     Comments: Baseline right eye deformity  Cardiovascular:     Rate and Rhythm: Normal rate.  Pulmonary:     Effort: Pulmonary effort is normal. No respiratory distress.     Breath sounds: No wheezing or rales.     Comments: Diminished at bases bilaterally Abdominal:     Palpations:  Abdomen is soft.     Tenderness: There is no abdominal tenderness.  Skin:    General: Skin is warm.  Neurological:     Mental Status: He is alert and oriented to person, place, and time. Mental status is at baseline.  Psychiatric:        Mood and Affect: Mood normal.     ED Results / Procedures / Treatments   Labs (all  labs ordered are listed, but only abnormal results are displayed) Labs Reviewed  RESP PANEL BY RT-PCR (FLU A&B, COVID) ARPGX2 - Abnormal; Notable for the following components:      Result Value   SARS Coronavirus 2 by RT PCR POSITIVE (*)    All other components within normal limits  BASIC METABOLIC PANEL - Abnormal; Notable for the following components:   Potassium 3.0 (*)    Chloride 97 (*)    Glucose, Bld 106 (*)    Creatinine, Ser 3.18 (*)    Calcium 8.6 (*)    GFR, Estimated 21 (*)    Anion gap 18 (*)    All other components within normal limits  CBC - Abnormal; Notable for the following components:   RBC 3.18 (*)    Hemoglobin 10.0 (*)    HCT 30.2 (*)    Platelets 100 (*)    All other components within normal limits  URINALYSIS, ROUTINE W REFLEX MICROSCOPIC    EKG EKG Interpretation  Date/Time:  Saturday May 27 2020 18:16:39 EST Ventricular Rate:  77 PR Interval:  180 QRS Duration: 84 QT Interval:  440 QTC Calculation: 497 R Axis:   60 Text Interpretation: Normal sinus rhythm ST & T wave abnormality, consider lateral ischemia Prolonged QT Abnormal ECG NSR, nonspecific ST changes present on previous Confirmed by Lavenia Atlas [phone]) on 05/27/2020 8:58:11 PM   Radiology No results found.  Procedures Procedures (including critical care time)  Medications Ordered in ED Medications - No data to display  ED Course  I have reviewed the triage vital signs and the nursing notes.  Pertinent labs & imaging results that were available during my care of the patient were reviewed by me and considered in my medical decision making (see chart for  details).    MDM Rules/Calculators/A&P                          60 year old male presents the emergency department with fatigue, congestion and reported fever.  He got a full dialysis session today.  His vitals are normal, he has no chest pain or respiratory symptoms/distress, lung sounds are equal, diminished at bases. No indication for CXR with absence of respiratory symptoms, no suspicion for PE at this time. Blood work is baseline for the patient, he is COVID-positive.  Discussed with the patient that he tested positive for Covid infection.  Educated the patient on Covid precautions, treatments and expectations.  Patient is stable for discharge and treatment as an outpatient. Patient agrees with the discharge plan/strict return precautions and verbalizes understanding.  Final Clinical Impression(s) / ED Diagnoses Final diagnoses:  None    Rx / DC Orders ED Discharge Orders    None       Lorelle Gibbs, DO 05/27/20 2217

## 2020-05-27 NOTE — ED Triage Notes (Signed)
Pt reports fever at dialysis today. C/o generalized weakness, non-productive cough, and body aches.

## 2020-05-28 ENCOUNTER — Other Ambulatory Visit: Payer: Self-pay | Admitting: Nurse Practitioner

## 2020-05-28 ENCOUNTER — Encounter: Payer: Self-pay | Admitting: Nurse Practitioner

## 2020-05-28 DIAGNOSIS — E119 Type 2 diabetes mellitus without complications: Secondary | ICD-10-CM | POA: Insufficient documentation

## 2020-05-28 DIAGNOSIS — N186 End stage renal disease: Secondary | ICD-10-CM

## 2020-05-28 DIAGNOSIS — U071 COVID-19: Secondary | ICD-10-CM

## 2020-05-28 DIAGNOSIS — I2510 Atherosclerotic heart disease of native coronary artery without angina pectoris: Secondary | ICD-10-CM

## 2020-05-28 DIAGNOSIS — I10 Essential (primary) hypertension: Secondary | ICD-10-CM

## 2020-05-28 DIAGNOSIS — I639 Cerebral infarction, unspecified: Secondary | ICD-10-CM | POA: Insufficient documentation

## 2020-05-28 NOTE — Progress Notes (Signed)
I connected by phone with Russell Price on 05/28/2020 at 10:28 AM to discuss the potential use of a new treatment for mild to moderate COVID-19 viral infection in non-hospitalized patients.  This patient is a 60 y.o. male that meets the FDA criteria for Emergency Use Authorization of COVID monoclonal antibody sotrovimab.  Has a (+) direct SARS-CoV-2 viral test result  Has mild or moderate COVID-19   Is NOT hospitalized due to COVID-19  Is within 10 days of symptom onset  Has at least one of the high risk factor(s) for progression to severe COVID-19 and/or hospitalization as defined in EUA.  Specific high risk criteria : Chronic Kidney Disease (CKD), Diabetes and Cardiovascular disease or hypertension   I have spoken and communicated the following to the patient or parent/caregiver regarding COVID monoclonal antibody treatment:  1. FDA has authorized the emergency use for the treatment of mild to moderate COVID-19 in adults and pediatric patients with positive results of direct SARS-CoV-2 viral testing who are 24 years of age and older weighing at least 40 kg, and who are at high risk for progressing to severe COVID-19 and/or hospitalization.  2. The significant known and potential risks and benefits of COVID monoclonal antibody, and the extent to which such potential risks and benefits are unknown.  3. Information on available alternative treatments and the risks and benefits of those alternatives, including clinical trials.  4. Patients treated with COVID monoclonal antibody should continue to self-isolate and use infection control measures (e.g., wear mask, isolate, social distance, avoid sharing personal items, clean and disinfect "high touch" surfaces, and frequent handwashing) according to CDC guidelines.   5. The patient or parent/caregiver has the option to accept or refuse COVID monoclonal antibody treatment.  After reviewing this information with the patient, the  patient has agreed to receive one of the available covid 19 monoclonal antibodies and will be provided an appropriate fact sheet prior to infusion. Murray Hodgkins, NP 05/28/2020 10:28 AM

## 2020-05-29 ENCOUNTER — Ambulatory Visit (HOSPITAL_COMMUNITY)
Admission: RE | Admit: 2020-05-29 | Discharge: 2020-05-29 | Disposition: A | Discharge status: Home / Self Care | Payer: Medicare HMO | Source: Ambulatory Visit | Attending: Pulmonary Disease | Admitting: Pulmonary Disease

## 2020-05-29 DIAGNOSIS — U071 COVID-19: Secondary | ICD-10-CM | POA: Insufficient documentation

## 2020-05-29 DIAGNOSIS — E1122 Type 2 diabetes mellitus with diabetic chronic kidney disease: Secondary | ICD-10-CM | POA: Insufficient documentation

## 2020-05-29 DIAGNOSIS — N186 End stage renal disease: Secondary | ICD-10-CM | POA: Insufficient documentation

## 2020-05-29 DIAGNOSIS — I120 Hypertensive chronic kidney disease with stage 5 chronic kidney disease or end stage renal disease: Secondary | ICD-10-CM | POA: Insufficient documentation

## 2020-05-29 DIAGNOSIS — I2510 Atherosclerotic heart disease of native coronary artery without angina pectoris: Secondary | ICD-10-CM | POA: Diagnosis not present

## 2020-05-29 DIAGNOSIS — E119 Type 2 diabetes mellitus without complications: Secondary | ICD-10-CM

## 2020-05-29 DIAGNOSIS — I10 Essential (primary) hypertension: Secondary | ICD-10-CM

## 2020-05-29 MED ORDER — FAMOTIDINE IN NACL 20-0.9 MG/50ML-% IV SOLN: 20.0000 mg | Freq: Once | INTRAVENOUS | Status: DC | PRN

## 2020-05-29 MED ORDER — ALBUTEROL SULFATE HFA 108 (90 BASE) MCG/ACT IN AERS: 2.0000 | INHALATION_SPRAY | Freq: Once | RESPIRATORY_TRACT | Status: DC | PRN

## 2020-05-29 MED ORDER — SOTROVIMAB 500 MG/8ML IV SOLN
500.0000 mg | Freq: Once | INTRAVENOUS | Status: AC
  Administered 2020-05-29: 500 mg via INTRAVENOUS

## 2020-05-29 MED ORDER — SODIUM CHLORIDE 0.9 % IV SOLN: INTRAVENOUS | Status: DC | PRN

## 2020-05-29 MED ORDER — DIPHENHYDRAMINE HCL 50 MG/ML IJ SOLN: 50.0000 mg | Freq: Once | INTRAMUSCULAR | Status: DC | PRN

## 2020-05-29 MED ORDER — METHYLPREDNISOLONE SODIUM SUCC 125 MG IJ SOLR: 125.0000 mg | Freq: Once | INTRAMUSCULAR | Status: DC | PRN

## 2020-05-29 MED ORDER — EPINEPHRINE 0.3 MG/0.3ML IJ SOAJ: 0.3000 mg | Freq: Once | INTRAMUSCULAR | Status: DC | PRN

## 2020-05-29 NOTE — Discharge Instructions (Signed)
10 Things You Can Do to Manage Your COVID-19 Symptoms at Home If you have possible or confirmed COVID-19: 1. Stay home except to get medical care. 2. Monitor your symptoms carefully. If your symptoms get worse, call your healthcare provider immediately. 3. Get rest and stay hydrated. 4. If you have a medical appointment, call the healthcare provider ahead of time and tell them that you have or may have COVID-19. 5. For medical emergencies, call 911 and notify the dispatch personnel that you have or may have COVID-19. 6. Cover your cough and sneezes with a tissue or use the inside of your elbow. 7. Wash your hands often with soap and water for at least 20 seconds or clean your hands with an alcohol-based hand sanitizer that contains at least 60% alcohol. 8. As much as possible, stay in a specific room and away from other people in your home. Also, you should use a separate bathroom, if available. If you need to be around other people in or outside of the home, wear a mask. 9. Avoid sharing personal items with other people in your household, like dishes, towels, and bedding. 10. Clean all surfaces that are touched often, like counters, tabletops, and doorknobs. Use household cleaning sprays or wipes according to the label instructions. [email or web address] 12/03/2019 This information is not intended to replace advice given to you by your health care provider. Make sure you discuss any questions you have with your health care provider. Document Revised: 03/20/2020 Document Reviewed: 03/20/2020 Elsevier Patient Education  2021 [place].  What types of side effects do monoclonal antibody drugs cause?  Common side effects  In general, the more common side effects caused by monoclonal antibody drugs include: . Allergic reactions, such as hives or itching . Flu-like signs and symptoms, including chills, fatigue, fever, and muscle aches and pains . Nausea, vomiting . Diarrhea . Skin  rashes . Low blood pressure   The CDC is recommending patients who receive monoclonal antibody treatments wait at least 90 days before being vaccinated.  Currently, there are no data on the safety and efficacy of mRNA COVID-19 vaccines in persons who received monoclonal antibodies or convalescent plasma as part of COVID-19 treatment. Based on the estimated half-life of such therapies as well as evidence suggesting that reinfection is uncommon in the 90 days after initial infection, vaccination should be deferred for at least 90 days, as a precautionary measure until additional information becomes available, to avoid interference of the antibody treatment with vaccine-induced immune responses.  If you have any questions or concerns after the infusion please call the [facility] Provider on call at [phone]. This number is ONLY intended for your use regarding questions or concerns about the infusion post-treatment side-effects.  Please do not provide this number to others for use. For return to work notes please contact your primary care provider.   If someone you know is interested in receiving treatment please have them call the [facility] hotline at [phone].

## 2020-05-29 NOTE — Progress Notes (Signed)
Patient reviewed Fact Sheet for Patients, Parents, and Caregivers for Emergency Use Authorization (EUA) of sotrovimab for the Treatment of Coronavirus. Patient also reviewed and is agreeable to the estimated cost of treatment. Patient is agreeable to proceed.   

## 2020-05-29 NOTE — Progress Notes (Signed)
Diagnosis: COVID-19  Physician: Dr. Patrick Wright  Procedure: Covid Infusion Clinic Med: Sotrovimab infusion - Provided patient with sotrovimab fact sheet for patients, parents, and caregivers prior to infusion.   Complications: No immediate complications noted  Discharge: Discharged home    

## 2020-05-31 ENCOUNTER — Other Ambulatory Visit: Payer: Self-pay | Admitting: Family Medicine

## 2020-05-31 ENCOUNTER — Other Ambulatory Visit: Payer: Self-pay

## 2020-05-31 ENCOUNTER — Ambulatory Visit: Payer: Medicare HMO | Attending: Family Medicine | Admitting: Family Medicine

## 2020-05-31 DIAGNOSIS — E1122 Type 2 diabetes mellitus with diabetic chronic kidney disease: Secondary | ICD-10-CM

## 2020-05-31 DIAGNOSIS — U071 COVID-19: Secondary | ICD-10-CM | POA: Diagnosis not present

## 2020-05-31 DIAGNOSIS — E785 Hyperlipidemia, unspecified: Secondary | ICD-10-CM | POA: Diagnosis not present

## 2020-05-31 DIAGNOSIS — I10 Essential (primary) hypertension: Secondary | ICD-10-CM | POA: Diagnosis not present

## 2020-05-31 DIAGNOSIS — E0841 Diabetes mellitus due to underlying condition with diabetic mononeuropathy: Secondary | ICD-10-CM

## 2020-05-31 DIAGNOSIS — N2581 Secondary hyperparathyroidism of renal origin: Secondary | ICD-10-CM | POA: Diagnosis not present

## 2020-05-31 DIAGNOSIS — E1169 Type 2 diabetes mellitus with other specified complication: Secondary | ICD-10-CM | POA: Diagnosis not present

## 2020-05-31 DIAGNOSIS — Z992 Dependence on renal dialysis: Secondary | ICD-10-CM | POA: Diagnosis not present

## 2020-05-31 DIAGNOSIS — N186 End stage renal disease: Secondary | ICD-10-CM

## 2020-05-31 DIAGNOSIS — Z794 Long term (current) use of insulin: Secondary | ICD-10-CM | POA: Diagnosis not present

## 2020-05-31 MED ORDER — ATORVASTATIN CALCIUM 40 MG PO TABS: 40.0000 mg | ORAL_TABLET | Freq: Every day | ORAL | 1 refills | Status: DC

## 2020-05-31 MED ORDER — CARVEDILOL 6.25 MG PO TABS: 6.2500 mg | ORAL_TABLET | Freq: Two times a day (BID) | ORAL | 1 refills | Status: DC

## 2020-05-31 MED ORDER — INSULIN GLARGINE 100 UNIT/ML ~~LOC~~ SOLN: 10.0000 [IU] | Freq: Two times a day (BID) | SUBCUTANEOUS | 6 refills | Status: DC

## 2020-05-31 MED ORDER — GABAPENTIN 300 MG PO CAPS
300.0000 mg | ORAL_CAPSULE | Freq: Every day | ORAL | 1 refills | Status: DC
Start: 2020-05-31 — End: 2020-06-30

## 2020-05-31 MED FILL — ATORVASTATIN CALCIUM 40 MG: 40 | 90 days supply | Qty: 90 | Fill #0

## 2020-05-31 MED FILL — GABAPENTIN 300 MG CAPSULE: 300 | 90 days supply | Qty: 90 | Fill #0

## 2020-05-31 MED FILL — LANTUS 100 UNITS/ML VIAL: 100 | 28 days supply | Qty: 10 | Fill #0

## 2020-05-31 MED FILL — CARVEDILOL 6.25 MG TABLET: 6.25 | 90 days supply | Qty: 180 | Fill #0

## 2020-05-31 NOTE — Progress Notes (Signed)
Needs refills on medications.  Tested positive for covid on Saturday.

## 2020-05-31 NOTE — Progress Notes (Signed)
Virtual Visit via Telephone Note  I connected with Kaevon Sheikhidris Korte, on 05/31/2020 at 2:10 PM by telephone due to the COVID-19 pandemic and verified that I am speaking with the correct person using two identifiers.   Consent: I discussed the limitations, risks, security and privacy concerns of performing an evaluation and management service by telephone and the availability of in person appointments. I also discussed with the patient that there may be a patient responsible charge related to this service. The patient expressed understanding and agreed to proceed.   Location of Patient: Home  Location of Provider: Home   Persons participating in Telemedicine visit: Parsa Sheikhidris Hoxworth Ozzie Hoyle Dr. Margarita Rana     History of Present Illness: Russell Price is a89 year old male with a history of hypertension, type 2 diabetes mellitus (A1c7.3), diabetic neuropathy, blind in the right eye, end-stage renal disease (currently on hemodialysis on Tuesdays, Thursdays and Saturdays), COVID-19 (s/p monoclonal ab) here for follow-up visit. He was diagnosed with COVID-19 on 05/27/2020. Denies presence of fever, congestion, chest pain and states he feels he is at baseline.  With regards to his Diabetes he denies presence of hypoglycemia and is doing good on his current dose of Lantus and Novolog. His BP fluctuates especially during his hemodialysis sessions. [place] and is considering going to [place]. He has no additional concerns today.   Past Medical History:  Diagnosis Date  . Back pain 03/12/2018  . CAD (coronary artery disease)    a. underwent cath in 02/2016 after an abnormal nuc which showed 2V CAD in RCA and OM3 with no good targets for PCI. Medical managment recommended.   Marland Kitchen COVID-19 virus infection 05/2020  . Diabetes mellitus without complication ([place])    type II - followed by Dr Chalmers Cater   . Difficult  intubation    ER intubation 2016  . ESRD (end stage renal disease) ([place])    Tues, Th, Sat [address]  . Fever 03/12/2018  . Glaucoma   . Hemodialysis patient [facility])    has been on dialysis since approx 10/2016   . Hypertension    followed by [facility]  . Stroke [facility])    patient denies on 02/19/2017    Allergies  Allergen Reactions  . Other Rash    chlorhexidine    Current Outpatient Medications on File Prior to Visit  Medication Sig Dispense Refill  . ACCU-CHEK SOFTCLIX LANCETS lancets Use as instructed 3 times daily before meals and at bedtime. E11.9 100 each 12  . aspirin EC 81 MG tablet Take 81 mg by mouth daily.    Marland Kitchen atorvastatin (LIPITOR) 40 MG tablet Take 1 tablet (40 mg total) by mouth at bedtime. 90 tablet 1  . b complex-vitamin c-folic acid (NEPHRO-VITE) 0.8 MG TABS tablet Take 1 tablet by mouth at bedtime.    . Blood Glucose Monitoring Suppl (ACCU-CHEK AVIVA) device Use as instructed 3 times daily before meals and at bedtime. E11.9 1 each 0  . Blood Glucose Monitoring Suppl (ACCU-CHEK GUIDE ME) w/Device KIT Use 3 times daily before meals. 1 kit 0  . carvedilol (COREG) 6.25 MG tablet Take 1 tablet (6.25 mg total) by mouth 2 (two) times daily. 180 tablet 1  . doxercalciferol (HECTOROL) 0.5 MCG capsule Doxercalciferol (Hectorol)    . gabapentin (NEURONTIN) 300 MG capsule Take 1 capsule (300 mg total) by mouth daily. 90 capsule 1  . glucose blood (ACCU-CHEK AVIVA PLUS) test strip USE 1  STRIP TO CHECK GLUCOSE BEFORE MEAL(S) AND AT BEDTIME 100 each 2  . insulin aspart (NOVOLOG) 100 UNIT/ML injection Inject 0-12 Units into the skin 3 (three) times daily with meals. per sliding scale 30 mL 3  . insulin glargine (LANTUS) 100 UNIT/ML injection Inject 0.1 mLs (10 Units total) into the skin 2 (two) times daily. 30 mL 6  . iron sucrose in sodium chloride 0.9 % 100 mL Iron Sucrose (Venofer)    . Lancet Devices (ACCU-CHEK SOFTCLIX) lancets Use as instructed 3 times  daily before meals and at bedtime. E11.9 1 each 12  . Misc. Devices MISC Tub transfer bench. Dx: acute discitis and low back pain 1 each 0  . Misc. Devices MISC Blood Pressure monitor. Dx- Hypertension. 1 each 0  . mupirocin cream (BACTROBAN) 2 % Apply 1 application topically 2 (two) times daily. For one week 15 g 0  . RENAGEL 800 MG tablet Take 800-2,400 mg by mouth See admin instructions. Take 2400 mg by mouth 3 times daily with meals and take 800 mg by mouth twice daily with snacks    . tadalafil (CIALIS) 10 MG tablet Take 1 tablet (10 mg total) by mouth every other day as needed for erectile dysfunction. 10 tablet 1  . timolol (BETIMOL) 0.5 % ophthalmic solution Place 1 drop into the left eye 2 (two) times daily.    Marland Kitchen triamcinolone cream (KENALOG) 0.1 % Apply 1 application topically 2 (two) times daily. 45 g 2  . Heparin & NaCl Lock Flush (HEPARIN SODIUM FLUSH IV) Heparin Sodium (Porcine) 1,000 Units/mL Catheter Lock Arterial     No current facility-administered medications on file prior to visit.    Observations/Objective: Awake, alert, oriented x3 No in acute dsitress Normal mood   Lab Results  Component Value Date   HGBA1C 7.3 (A) 03/01/2020    Assessment and Plan: 1. Type 2 diabetes mellitus with chronic kidney disease on chronic dialysis, with long-term current use of insulin (HCC) Controlled with A1c of 7.3 Continue current regimen He is looking at going to [place] for renal transplant Counseled on Diabetic diet, my plate method, 244 minutes of moderate intensity exercise/week Blood sugar logs with fasting goals of 80-120 mg/dl, random of less than 180 and in the event of sugars less than 60 mg/dl or greater than 400 mg/dl encouraged to notify the clinic. Advised on the need for annual eye exams, annual foot exams, Pneumonia vaccine. - insulin glargine (LANTUS) 100 UNIT/ML injection; Inject 0.1 mLs (10 Units total) into the skin 2 (two) times daily.  Dispense: 30 mL; Refill:  6  2. Diabetic mononeuropathy associated with diabetes mellitus due to underlying condition (HCC) Stable - gabapentin (NEURONTIN) 300 MG capsule; Take 1 capsule (300 mg total) by mouth daily.  Dispense: 90 capsule; Refill: 1  3. Essential hypertension Fluctuating Will defer management to Nephrology to prevent hypotension during hemodialysis - carvedilol (COREG) 6.25 MG tablet; Take 1 tablet (6.25 mg total) by mouth 2 (two) times daily.  Dispense: 180 tablet; Refill: 1  4. Hyperlipidemia associated with type 2 diabetes mellitus (HCC) Controlled Low cholesterol diet - atorvastatin (LIPITOR) 40 MG tablet; Take 1 tablet (40 mg total) by mouth at bedtime.  Dispense: 90 tablet; Refill: 1   Follow Up Instructions: 6 months   I discussed the assessment and treatment plan with the patient. The patient was provided an opportunity to ask questions and all were answered. The patient agreed with the plan and demonstrated an understanding of the instructions.  The patient was advised to call back or seek an in-person evaluation if the symptoms worsen or if the condition fails to improve as anticipated.     I provided 15 minutes total of non-face-to-face time during this encounter including median intraservice time, reviewing previous notes, investigations, ordering medications, medical decision making, coordinating care and patient verbalized understanding at the end of the visit.     Charlott Rakes, MD, FAAFP. [facility] [place], [place]   05/31/2020, 2:10 PM

## 2020-06-02 DIAGNOSIS — N186 End stage renal disease: Secondary | ICD-10-CM | POA: Diagnosis not present

## 2020-06-02 DIAGNOSIS — N2581 Secondary hyperparathyroidism of renal origin: Secondary | ICD-10-CM | POA: Diagnosis not present

## 2020-06-02 DIAGNOSIS — Z992 Dependence on renal dialysis: Secondary | ICD-10-CM | POA: Diagnosis not present

## 2020-06-05 DIAGNOSIS — N2581 Secondary hyperparathyroidism of renal origin: Secondary | ICD-10-CM | POA: Diagnosis not present

## 2020-06-05 DIAGNOSIS — Z992 Dependence on renal dialysis: Secondary | ICD-10-CM | POA: Diagnosis not present

## 2020-06-05 DIAGNOSIS — N186 End stage renal disease: Secondary | ICD-10-CM | POA: Diagnosis not present

## 2020-06-07 DIAGNOSIS — E113512 Type 2 diabetes mellitus with proliferative diabetic retinopathy with macular edema, left eye: Secondary | ICD-10-CM | POA: Diagnosis not present

## 2020-06-08 DIAGNOSIS — Z992 Dependence on renal dialysis: Secondary | ICD-10-CM | POA: Diagnosis not present

## 2020-06-08 DIAGNOSIS — N186 End stage renal disease: Secondary | ICD-10-CM | POA: Diagnosis not present

## 2020-06-08 DIAGNOSIS — N2581 Secondary hyperparathyroidism of renal origin: Secondary | ICD-10-CM | POA: Diagnosis not present

## 2020-06-09 ENCOUNTER — Ambulatory Visit: Payer: Medicare HMO

## 2020-06-10 DIAGNOSIS — N2581 Secondary hyperparathyroidism of renal origin: Secondary | ICD-10-CM | POA: Diagnosis not present

## 2020-06-10 DIAGNOSIS — Z992 Dependence on renal dialysis: Secondary | ICD-10-CM | POA: Diagnosis not present

## 2020-06-10 DIAGNOSIS — N186 End stage renal disease: Secondary | ICD-10-CM | POA: Diagnosis not present

## 2020-06-13 DIAGNOSIS — Z992 Dependence on renal dialysis: Secondary | ICD-10-CM | POA: Diagnosis not present

## 2020-06-13 DIAGNOSIS — N2581 Secondary hyperparathyroidism of renal origin: Secondary | ICD-10-CM | POA: Diagnosis not present

## 2020-06-13 DIAGNOSIS — N186 End stage renal disease: Secondary | ICD-10-CM | POA: Diagnosis not present

## 2020-06-15 DIAGNOSIS — N2581 Secondary hyperparathyroidism of renal origin: Secondary | ICD-10-CM | POA: Diagnosis not present

## 2020-06-15 DIAGNOSIS — N186 End stage renal disease: Secondary | ICD-10-CM | POA: Diagnosis not present

## 2020-06-15 DIAGNOSIS — Z992 Dependence on renal dialysis: Secondary | ICD-10-CM | POA: Diagnosis not present

## 2020-06-17 DIAGNOSIS — N186 End stage renal disease: Secondary | ICD-10-CM | POA: Diagnosis not present

## 2020-06-17 DIAGNOSIS — N2581 Secondary hyperparathyroidism of renal origin: Secondary | ICD-10-CM | POA: Diagnosis not present

## 2020-06-17 DIAGNOSIS — Z992 Dependence on renal dialysis: Secondary | ICD-10-CM | POA: Diagnosis not present

## 2020-06-19 DIAGNOSIS — I120 Hypertensive chronic kidney disease with stage 5 chronic kidney disease or end stage renal disease: Secondary | ICD-10-CM | POA: Diagnosis not present

## 2020-06-19 DIAGNOSIS — Z87891 Personal history of nicotine dependence: Secondary | ICD-10-CM | POA: Diagnosis not present

## 2020-06-19 DIAGNOSIS — Z992 Dependence on renal dialysis: Secondary | ICD-10-CM | POA: Diagnosis not present

## 2020-06-19 DIAGNOSIS — E1122 Type 2 diabetes mellitus with diabetic chronic kidney disease: Secondary | ICD-10-CM | POA: Diagnosis not present

## 2020-06-19 DIAGNOSIS — N186 End stage renal disease: Secondary | ICD-10-CM | POA: Diagnosis not present

## 2020-06-19 DIAGNOSIS — Z7982 Long term (current) use of aspirin: Secondary | ICD-10-CM | POA: Diagnosis not present

## 2020-06-19 DIAGNOSIS — Z79899 Other long term (current) drug therapy: Secondary | ICD-10-CM | POA: Diagnosis not present

## 2020-06-19 DIAGNOSIS — I2510 Atherosclerotic heart disease of native coronary artery without angina pectoris: Secondary | ICD-10-CM | POA: Diagnosis not present

## 2020-06-20 DIAGNOSIS — Z992 Dependence on renal dialysis: Secondary | ICD-10-CM | POA: Diagnosis not present

## 2020-06-20 DIAGNOSIS — N2581 Secondary hyperparathyroidism of renal origin: Secondary | ICD-10-CM | POA: Diagnosis not present

## 2020-06-20 DIAGNOSIS — N186 End stage renal disease: Secondary | ICD-10-CM | POA: Diagnosis not present

## 2020-06-22 DIAGNOSIS — Z992 Dependence on renal dialysis: Secondary | ICD-10-CM | POA: Diagnosis not present

## 2020-06-22 DIAGNOSIS — N186 End stage renal disease: Secondary | ICD-10-CM | POA: Diagnosis not present

## 2020-06-22 DIAGNOSIS — N2581 Secondary hyperparathyroidism of renal origin: Secondary | ICD-10-CM | POA: Diagnosis not present

## 2020-06-24 DIAGNOSIS — Z992 Dependence on renal dialysis: Secondary | ICD-10-CM | POA: Diagnosis not present

## 2020-06-24 DIAGNOSIS — N2581 Secondary hyperparathyroidism of renal origin: Secondary | ICD-10-CM | POA: Diagnosis not present

## 2020-06-24 DIAGNOSIS — N186 End stage renal disease: Secondary | ICD-10-CM | POA: Diagnosis not present

## 2020-06-26 ENCOUNTER — Ambulatory Visit: Payer: Medicare HMO

## 2020-06-27 DIAGNOSIS — N2581 Secondary hyperparathyroidism of renal origin: Secondary | ICD-10-CM | POA: Diagnosis not present

## 2020-06-27 DIAGNOSIS — N186 End stage renal disease: Secondary | ICD-10-CM | POA: Diagnosis not present

## 2020-06-27 DIAGNOSIS — Z992 Dependence on renal dialysis: Secondary | ICD-10-CM | POA: Diagnosis not present

## 2020-06-29 ENCOUNTER — Telehealth: Payer: Self-pay | Admitting: Family Medicine

## 2020-06-29 DIAGNOSIS — N2581 Secondary hyperparathyroidism of renal origin: Secondary | ICD-10-CM | POA: Diagnosis not present

## 2020-06-29 DIAGNOSIS — N186 End stage renal disease: Secondary | ICD-10-CM | POA: Diagnosis not present

## 2020-06-29 DIAGNOSIS — E1122 Type 2 diabetes mellitus with diabetic chronic kidney disease: Secondary | ICD-10-CM

## 2020-06-29 DIAGNOSIS — I10 Essential (primary) hypertension: Secondary | ICD-10-CM

## 2020-06-29 DIAGNOSIS — E118 Type 2 diabetes mellitus with unspecified complications: Secondary | ICD-10-CM

## 2020-06-29 DIAGNOSIS — Z794 Long term (current) use of insulin: Secondary | ICD-10-CM

## 2020-06-29 DIAGNOSIS — E0841 Diabetes mellitus due to underlying condition with diabetic mononeuropathy: Secondary | ICD-10-CM

## 2020-06-29 DIAGNOSIS — Z992 Dependence on renal dialysis: Secondary | ICD-10-CM | POA: Diagnosis not present

## 2020-06-29 DIAGNOSIS — E785 Hyperlipidemia, unspecified: Secondary | ICD-10-CM

## 2020-06-29 NOTE — Telephone Encounter (Signed)
Copied from [place] [id number]. Topic: General - Other >> Jun 29, 2020  1:47 PM Mcneil, Ja-Kwan wrote: Reason for CRM: Pt stated he will need his doctor to approve medication refill for 3 to 4 months as he will be going out of the country to have kidney transplant surgery. Pt requests call back.   Call placed to patient to clarify his needs. Patient states that he is leaving the country next week for [place] so that he can get a kidney transplant. He is asking for his medication by tomorrow for 3-4 months so he has it while in [place]. Please advise.

## 2020-06-29 NOTE — Telephone Encounter (Signed)
Will route to PCP for review. 

## 2020-06-30 ENCOUNTER — Other Ambulatory Visit: Payer: Self-pay | Admitting: Family Medicine

## 2020-06-30 MED ORDER — INSULIN GLARGINE 100 UNIT/ML ~~LOC~~ SOLN: 10.0000 [IU] | Freq: Two times a day (BID) | SUBCUTANEOUS | 1 refills | Status: DC

## 2020-06-30 MED ORDER — INSULIN ASPART 100 UNIT/ML ~~LOC~~ SOLN: 0.0000 [IU] | Freq: Three times a day (TID) | SUBCUTANEOUS | 3 refills | Status: DC

## 2020-06-30 MED ORDER — CARVEDILOL 6.25 MG PO TABS: 6.2500 mg | ORAL_TABLET | Freq: Two times a day (BID) | ORAL | 1 refills | Status: DC

## 2020-06-30 MED ORDER — GABAPENTIN 300 MG PO CAPS
300.0000 mg | ORAL_CAPSULE | Freq: Every day | ORAL | 1 refills | Status: DC
Start: 2020-06-30 — End: 2020-06-30

## 2020-06-30 MED ORDER — ATORVASTATIN CALCIUM 40 MG PO TABS: 40.0000 mg | ORAL_TABLET | Freq: Every day | ORAL | 1 refills | Status: DC

## 2020-06-30 MED FILL — NovoLOG 100 UNIT/ML SOLN: 100 | 83 days supply | Qty: 30 | Fill #0

## 2020-06-30 MED FILL — LANTUS 100 UNITS/ML VIAL: 100 | 90 days supply | Qty: 20 | Fill #0

## 2020-06-30 NOTE — Telephone Encounter (Signed)
Done

## 2020-06-30 NOTE — Addendum Note (Signed)
Addended byCharlott Rakes on: 06/30/2020 09:04 AM   Modules accepted: Orders

## 2020-07-01 DIAGNOSIS — N186 End stage renal disease: Secondary | ICD-10-CM | POA: Diagnosis not present

## 2020-07-01 DIAGNOSIS — Z992 Dependence on renal dialysis: Secondary | ICD-10-CM | POA: Diagnosis not present

## 2020-07-01 DIAGNOSIS — N2581 Secondary hyperparathyroidism of renal origin: Secondary | ICD-10-CM | POA: Diagnosis not present

## 2020-07-03 MED FILL — CARVEDILOL 6.25 MG TABLET: 6.25 | 90 days supply | Qty: 180 | Fill #1

## 2020-07-03 MED FILL — ACCU-CHEK GUIDE TEST STRIP: 90 days supply | Qty: 300 | Fill #5

## 2020-07-03 MED FILL — ATORVASTATIN CALCIUM 40 MG: 40 | 90 days supply | Qty: 90 | Fill #1

## 2020-07-03 MED FILL — GABAPENTIN 300 MG CAPSULE: 300 | 90 days supply | Qty: 90 | Fill #1

## 2020-07-04 DIAGNOSIS — Z992 Dependence on renal dialysis: Secondary | ICD-10-CM | POA: Diagnosis not present

## 2020-07-04 DIAGNOSIS — N2581 Secondary hyperparathyroidism of renal origin: Secondary | ICD-10-CM | POA: Diagnosis not present

## 2020-07-04 DIAGNOSIS — N186 End stage renal disease: Secondary | ICD-10-CM | POA: Diagnosis not present

## 2020-07-06 DIAGNOSIS — N186 End stage renal disease: Secondary | ICD-10-CM | POA: Diagnosis not present

## 2020-07-06 DIAGNOSIS — N2581 Secondary hyperparathyroidism of renal origin: Secondary | ICD-10-CM | POA: Diagnosis not present

## 2020-07-06 DIAGNOSIS — Z992 Dependence on renal dialysis: Secondary | ICD-10-CM | POA: Diagnosis not present

## 2020-07-07 DIAGNOSIS — Z20822 Contact with and (suspected) exposure to covid-19: Secondary | ICD-10-CM | POA: Diagnosis not present

## 2020-07-08 DIAGNOSIS — N2581 Secondary hyperparathyroidism of renal origin: Secondary | ICD-10-CM | POA: Diagnosis not present

## 2020-07-08 DIAGNOSIS — Z992 Dependence on renal dialysis: Secondary | ICD-10-CM | POA: Diagnosis not present

## 2020-07-08 DIAGNOSIS — N186 End stage renal disease: Secondary | ICD-10-CM | POA: Diagnosis not present

## 2020-07-17 DIAGNOSIS — Z992 Dependence on renal dialysis: Secondary | ICD-10-CM | POA: Diagnosis not present

## 2020-07-17 DIAGNOSIS — E1122 Type 2 diabetes mellitus with diabetic chronic kidney disease: Secondary | ICD-10-CM | POA: Diagnosis not present

## 2020-07-17 DIAGNOSIS — N186 End stage renal disease: Secondary | ICD-10-CM | POA: Diagnosis not present

## 2020-07-26 ENCOUNTER — Ambulatory Visit: Payer: Medicare HMO | Admitting: Physician Assistant

## 2020-08-19 ENCOUNTER — Other Ambulatory Visit: Payer: Self-pay

## 2020-09-20 ENCOUNTER — Other Ambulatory Visit: Payer: Self-pay

## 2020-09-20 MED FILL — Insulin Glargine Inj 100 Unit/ML: SUBCUTANEOUS | 84 days supply | Qty: 30 | Fill #0 | Status: AC

## 2020-09-20 MED FILL — Atorvastatin Calcium Tab 40 MG (Base Equivalent): ORAL | 90 days supply | Qty: 90 | Fill #0 | Status: AC

## 2020-09-20 MED FILL — Carvedilol Tab 6.25 MG: ORAL | 90 days supply | Qty: 180 | Fill #0 | Status: AC

## 2020-09-20 MED FILL — Insulin Aspart Inj Soln 100 Unit/ML: INTRAMUSCULAR | 83 days supply | Qty: 30 | Fill #0 | Status: AC

## 2020-09-20 MED FILL — Glucose Blood Test Strip: 33 days supply | Qty: 100 | Fill #0 | Status: AC

## 2020-09-20 MED FILL — Gabapentin Cap 300 MG: ORAL | 90 days supply | Qty: 90 | Fill #0 | Status: AC

## 2020-09-22 ENCOUNTER — Other Ambulatory Visit: Payer: Self-pay

## 2020-10-28 DIAGNOSIS — E1151 Type 2 diabetes mellitus with diabetic peripheral angiopathy without gangrene: Secondary | ICD-10-CM | POA: Diagnosis not present

## 2020-10-28 DIAGNOSIS — Z4901 Encounter for fitting and adjustment of extracorporeal dialysis catheter: Secondary | ICD-10-CM | POA: Diagnosis not present

## 2020-10-28 DIAGNOSIS — J80 Acute respiratory distress syndrome: Secondary | ICD-10-CM | POA: Diagnosis not present

## 2020-10-28 DIAGNOSIS — R140 Abdominal distension (gaseous): Secondary | ICD-10-CM | POA: Diagnosis not present

## 2020-10-28 DIAGNOSIS — K648 Other hemorrhoids: Secondary | ICD-10-CM | POA: Diagnosis not present

## 2020-10-28 DIAGNOSIS — E118 Type 2 diabetes mellitus with unspecified complications: Secondary | ICD-10-CM | POA: Diagnosis not present

## 2020-10-28 DIAGNOSIS — Z992 Dependence on renal dialysis: Secondary | ICD-10-CM | POA: Diagnosis not present

## 2020-10-28 DIAGNOSIS — D631 Anemia in chronic kidney disease: Secondary | ICD-10-CM | POA: Diagnosis not present

## 2020-10-28 DIAGNOSIS — A498 Other bacterial infections of unspecified site: Secondary | ICD-10-CM | POA: Diagnosis not present

## 2020-10-28 DIAGNOSIS — M65871 Other synovitis and tenosynovitis, right ankle and foot: Secondary | ICD-10-CM | POA: Diagnosis not present

## 2020-10-28 DIAGNOSIS — J189 Pneumonia, unspecified organism: Secondary | ICD-10-CM | POA: Diagnosis not present

## 2020-10-28 DIAGNOSIS — Z4682 Encounter for fitting and adjustment of non-vascular catheter: Secondary | ICD-10-CM | POA: Diagnosis not present

## 2020-10-28 DIAGNOSIS — E1142 Type 2 diabetes mellitus with diabetic polyneuropathy: Secondary | ICD-10-CM | POA: Diagnosis not present

## 2020-10-28 DIAGNOSIS — M66271 Spontaneous rupture of extensor tendons, right ankle and foot: Secondary | ICD-10-CM | POA: Diagnosis not present

## 2020-10-28 DIAGNOSIS — A4151 Sepsis due to Escherichia coli [E. coli]: Secondary | ICD-10-CM | POA: Diagnosis not present

## 2020-10-28 DIAGNOSIS — L089 Local infection of the skin and subcutaneous tissue, unspecified: Secondary | ICD-10-CM | POA: Diagnosis not present

## 2020-10-28 DIAGNOSIS — K921 Melena: Secondary | ICD-10-CM | POA: Diagnosis not present

## 2020-10-28 DIAGNOSIS — I82B12 Acute embolism and thrombosis of left subclavian vein: Secondary | ICD-10-CM | POA: Diagnosis not present

## 2020-10-28 DIAGNOSIS — D649 Anemia, unspecified: Secondary | ICD-10-CM | POA: Diagnosis not present

## 2020-10-28 DIAGNOSIS — E875 Hyperkalemia: Secondary | ICD-10-CM | POA: Diagnosis not present

## 2020-10-28 DIAGNOSIS — R1312 Dysphagia, oropharyngeal phase: Secondary | ICD-10-CM | POA: Diagnosis not present

## 2020-10-28 DIAGNOSIS — R760 Raised antibody titer: Secondary | ICD-10-CM | POA: Diagnosis not present

## 2020-10-28 DIAGNOSIS — D62 Acute posthemorrhagic anemia: Secondary | ICD-10-CM | POA: Diagnosis not present

## 2020-10-28 DIAGNOSIS — Z452 Encounter for adjustment and management of vascular access device: Secondary | ICD-10-CM | POA: Diagnosis not present

## 2020-10-28 DIAGNOSIS — I2510 Atherosclerotic heart disease of native coronary artery without angina pectoris: Secondary | ICD-10-CM | POA: Diagnosis not present

## 2020-10-28 DIAGNOSIS — R7881 Bacteremia: Secondary | ICD-10-CM | POA: Diagnosis not present

## 2020-10-28 DIAGNOSIS — I120 Hypertensive chronic kidney disease with stage 5 chronic kidney disease or end stage renal disease: Secondary | ICD-10-CM | POA: Diagnosis not present

## 2020-10-28 DIAGNOSIS — E1140 Type 2 diabetes mellitus with diabetic neuropathy, unspecified: Secondary | ICD-10-CM | POA: Diagnosis not present

## 2020-10-28 DIAGNOSIS — T8249XA Other complication of vascular dialysis catheter, initial encounter: Secondary | ICD-10-CM | POA: Diagnosis not present

## 2020-10-28 DIAGNOSIS — K5730 Diverticulosis of large intestine without perforation or abscess without bleeding: Secondary | ICD-10-CM | POA: Diagnosis not present

## 2020-10-28 DIAGNOSIS — T827XXA Infection and inflammatory reaction due to other cardiac and vascular devices, implants and grafts, initial encounter: Secondary | ICD-10-CM | POA: Diagnosis not present

## 2020-10-28 DIAGNOSIS — T80211A Bloodstream infection due to central venous catheter, initial encounter: Secondary | ICD-10-CM | POA: Diagnosis not present

## 2020-10-28 DIAGNOSIS — Z9989 Dependence on other enabling machines and devices: Secondary | ICD-10-CM | POA: Diagnosis not present

## 2020-10-28 DIAGNOSIS — Z515 Encounter for palliative care: Secondary | ICD-10-CM | POA: Diagnosis not present

## 2020-10-28 DIAGNOSIS — I6523 Occlusion and stenosis of bilateral carotid arteries: Secondary | ICD-10-CM | POA: Diagnosis not present

## 2020-10-28 DIAGNOSIS — E1110 Type 2 diabetes mellitus with ketoacidosis without coma: Secondary | ICD-10-CM | POA: Diagnosis not present

## 2020-10-28 DIAGNOSIS — R579 Shock, unspecified: Secondary | ICD-10-CM | POA: Diagnosis not present

## 2020-10-28 DIAGNOSIS — I82622 Acute embolism and thrombosis of deep veins of left upper extremity: Secondary | ICD-10-CM | POA: Diagnosis not present

## 2020-10-28 DIAGNOSIS — R4182 Altered mental status, unspecified: Secondary | ICD-10-CM | POA: Diagnosis not present

## 2020-10-28 DIAGNOSIS — G9340 Encephalopathy, unspecified: Secondary | ICD-10-CM | POA: Diagnosis not present

## 2020-10-28 DIAGNOSIS — R269 Unspecified abnormalities of gait and mobility: Secondary | ICD-10-CM | POA: Diagnosis not present

## 2020-10-28 DIAGNOSIS — R1011 Right upper quadrant pain: Secondary | ICD-10-CM | POA: Diagnosis not present

## 2020-10-28 DIAGNOSIS — E1310 Other specified diabetes mellitus with ketoacidosis without coma: Secondary | ICD-10-CM | POA: Diagnosis not present

## 2020-10-28 DIAGNOSIS — G9341 Metabolic encephalopathy: Secondary | ICD-10-CM | POA: Diagnosis not present

## 2020-10-28 DIAGNOSIS — R059 Cough, unspecified: Secondary | ICD-10-CM | POA: Diagnosis not present

## 2020-10-28 DIAGNOSIS — K2210 Ulcer of esophagus without bleeding: Secondary | ICD-10-CM | POA: Diagnosis not present

## 2020-10-28 DIAGNOSIS — R279 Unspecified lack of coordination: Secondary | ICD-10-CM | POA: Diagnosis not present

## 2020-10-28 DIAGNOSIS — I639 Cerebral infarction, unspecified: Secondary | ICD-10-CM | POA: Diagnosis not present

## 2020-10-28 DIAGNOSIS — R278 Other lack of coordination: Secondary | ICD-10-CM | POA: Diagnosis not present

## 2020-10-28 DIAGNOSIS — N19 Unspecified kidney failure: Secondary | ICD-10-CM | POA: Diagnosis not present

## 2020-10-28 DIAGNOSIS — K264 Chronic or unspecified duodenal ulcer with hemorrhage: Secondary | ICD-10-CM | POA: Diagnosis not present

## 2020-10-28 DIAGNOSIS — M79674 Pain in right toe(s): Secondary | ICD-10-CM | POA: Diagnosis not present

## 2020-10-28 DIAGNOSIS — T82898A Other specified complication of vascular prosthetic devices, implants and grafts, initial encounter: Secondary | ICD-10-CM | POA: Diagnosis not present

## 2020-10-28 DIAGNOSIS — L02611 Cutaneous abscess of right foot: Secondary | ICD-10-CM | POA: Diagnosis not present

## 2020-10-28 DIAGNOSIS — T8241XA Breakdown (mechanical) of vascular dialysis catheter, initial encounter: Secondary | ICD-10-CM | POA: Diagnosis not present

## 2020-10-28 DIAGNOSIS — K3189 Other diseases of stomach and duodenum: Secondary | ICD-10-CM | POA: Diagnosis not present

## 2020-10-28 DIAGNOSIS — T82898D Other specified complication of vascular prosthetic devices, implants and grafts, subsequent encounter: Secondary | ICD-10-CM | POA: Diagnosis not present

## 2020-10-28 DIAGNOSIS — T80211D Bloodstream infection due to central venous catheter, subsequent encounter: Secondary | ICD-10-CM | POA: Diagnosis not present

## 2020-10-28 DIAGNOSIS — E1122 Type 2 diabetes mellitus with diabetic chronic kidney disease: Secondary | ICD-10-CM | POA: Diagnosis not present

## 2020-10-28 DIAGNOSIS — R0902 Hypoxemia: Secondary | ICD-10-CM | POA: Diagnosis not present

## 2020-10-28 DIAGNOSIS — R0689 Other abnormalities of breathing: Secondary | ICD-10-CM | POA: Diagnosis not present

## 2020-10-28 DIAGNOSIS — J90 Pleural effusion, not elsewhere classified: Secondary | ICD-10-CM | POA: Diagnosis not present

## 2020-10-28 DIAGNOSIS — L97512 Non-pressure chronic ulcer of other part of right foot with fat layer exposed: Secondary | ICD-10-CM | POA: Diagnosis not present

## 2020-10-28 DIAGNOSIS — M65071 Abscess of tendon sheath, right ankle and foot: Secondary | ICD-10-CM | POA: Diagnosis not present

## 2020-10-28 DIAGNOSIS — R0602 Shortness of breath: Secondary | ICD-10-CM | POA: Diagnosis not present

## 2020-10-28 DIAGNOSIS — R000 Tachycardia, unspecified: Secondary | ICD-10-CM | POA: Diagnosis not present

## 2020-10-28 DIAGNOSIS — N186 End stage renal disease: Secondary | ICD-10-CM | POA: Diagnosis not present

## 2020-10-28 DIAGNOSIS — R0600 Dyspnea, unspecified: Secondary | ICD-10-CM | POA: Diagnosis not present

## 2020-10-28 DIAGNOSIS — R836 Abnormal cytological findings in cerebrospinal fluid: Secondary | ICD-10-CM | POA: Diagnosis not present

## 2020-10-28 DIAGNOSIS — G319 Degenerative disease of nervous system, unspecified: Secondary | ICD-10-CM | POA: Diagnosis not present

## 2020-10-28 DIAGNOSIS — T82868A Thrombosis of vascular prosthetic devices, implants and grafts, initial encounter: Secondary | ICD-10-CM | POA: Diagnosis not present

## 2020-10-28 DIAGNOSIS — E43 Unspecified severe protein-calorie malnutrition: Secondary | ICD-10-CM | POA: Diagnosis not present

## 2020-10-28 DIAGNOSIS — K269 Duodenal ulcer, unspecified as acute or chronic, without hemorrhage or perforation: Secondary | ICD-10-CM | POA: Diagnosis not present

## 2020-10-28 DIAGNOSIS — Z20822 Contact with and (suspected) exposure to covid-19: Secondary | ICD-10-CM | POA: Diagnosis not present

## 2020-10-28 DIAGNOSIS — A4159 Other Gram-negative sepsis: Secondary | ICD-10-CM | POA: Diagnosis not present

## 2020-10-28 DIAGNOSIS — R195 Other fecal abnormalities: Secondary | ICD-10-CM | POA: Diagnosis not present

## 2020-10-28 DIAGNOSIS — M79671 Pain in right foot: Secondary | ICD-10-CM | POA: Diagnosis not present

## 2020-10-28 DIAGNOSIS — G4733 Obstructive sleep apnea (adult) (pediatric): Secondary | ICD-10-CM | POA: Diagnosis not present

## 2020-10-28 DIAGNOSIS — R778 Other specified abnormalities of plasma proteins: Secondary | ICD-10-CM | POA: Diagnosis not present

## 2020-10-28 DIAGNOSIS — I319 Disease of pericardium, unspecified: Secondary | ICD-10-CM | POA: Diagnosis not present

## 2020-10-28 DIAGNOSIS — K31A19 Gastric intestinal metaplasia without dysplasia, unspecified site: Secondary | ICD-10-CM | POA: Diagnosis not present

## 2020-10-28 DIAGNOSIS — I959 Hypotension, unspecified: Secondary | ICD-10-CM | POA: Diagnosis not present

## 2020-10-28 DIAGNOSIS — I10 Essential (primary) hypertension: Secondary | ICD-10-CM | POA: Diagnosis not present

## 2020-10-28 DIAGNOSIS — N178 Other acute kidney failure: Secondary | ICD-10-CM | POA: Diagnosis not present

## 2020-10-28 DIAGNOSIS — R7401 Elevation of levels of liver transaminase levels: Secondary | ICD-10-CM | POA: Diagnosis not present

## 2020-10-28 DIAGNOSIS — R1310 Dysphagia, unspecified: Secondary | ICD-10-CM | POA: Diagnosis not present

## 2020-10-28 DIAGNOSIS — M6281 Muscle weakness (generalized): Secondary | ICD-10-CM | POA: Diagnosis not present

## 2020-10-28 DIAGNOSIS — R9431 Abnormal electrocardiogram [ECG] [EKG]: Secondary | ICD-10-CM | POA: Diagnosis not present

## 2020-10-28 DIAGNOSIS — R911 Solitary pulmonary nodule: Secondary | ICD-10-CM | POA: Diagnosis not present

## 2020-10-28 DIAGNOSIS — K2950 Unspecified chronic gastritis without bleeding: Secondary | ICD-10-CM | POA: Diagnosis not present

## 2020-10-28 DIAGNOSIS — A4150 Gram-negative sepsis, unspecified: Secondary | ICD-10-CM | POA: Diagnosis not present

## 2020-10-28 DIAGNOSIS — K922 Gastrointestinal hemorrhage, unspecified: Secondary | ICD-10-CM | POA: Diagnosis not present

## 2020-10-28 DIAGNOSIS — I6501 Occlusion and stenosis of right vertebral artery: Secondary | ICD-10-CM | POA: Diagnosis not present

## 2020-10-28 DIAGNOSIS — J939 Pneumothorax, unspecified: Secondary | ICD-10-CM | POA: Diagnosis not present

## 2020-10-28 DIAGNOSIS — R578 Other shock: Secondary | ICD-10-CM | POA: Diagnosis not present

## 2020-10-28 DIAGNOSIS — R251 Tremor, unspecified: Secondary | ICD-10-CM | POA: Diagnosis not present

## 2020-10-28 DIAGNOSIS — R109 Unspecified abdominal pain: Secondary | ICD-10-CM | POA: Diagnosis not present

## 2020-10-29 DIAGNOSIS — I6523 Occlusion and stenosis of bilateral carotid arteries: Secondary | ICD-10-CM | POA: Diagnosis not present

## 2020-10-29 DIAGNOSIS — R836 Abnormal cytological findings in cerebrospinal fluid: Secondary | ICD-10-CM | POA: Diagnosis not present

## 2020-10-29 DIAGNOSIS — R7401 Elevation of levels of liver transaminase levels: Secondary | ICD-10-CM | POA: Diagnosis not present

## 2020-10-29 DIAGNOSIS — I6501 Occlusion and stenosis of right vertebral artery: Secondary | ICD-10-CM | POA: Diagnosis not present

## 2020-10-29 DIAGNOSIS — G319 Degenerative disease of nervous system, unspecified: Secondary | ICD-10-CM | POA: Diagnosis not present

## 2020-10-29 DIAGNOSIS — D631 Anemia in chronic kidney disease: Secondary | ICD-10-CM | POA: Diagnosis not present

## 2020-10-29 DIAGNOSIS — I639 Cerebral infarction, unspecified: Secondary | ICD-10-CM | POA: Diagnosis not present

## 2020-10-29 DIAGNOSIS — N186 End stage renal disease: Secondary | ICD-10-CM | POA: Diagnosis not present

## 2020-10-29 DIAGNOSIS — I82622 Acute embolism and thrombosis of deep veins of left upper extremity: Secondary | ICD-10-CM | POA: Diagnosis not present

## 2020-10-29 DIAGNOSIS — N19 Unspecified kidney failure: Secondary | ICD-10-CM | POA: Diagnosis not present

## 2020-10-29 DIAGNOSIS — T82868A Thrombosis of vascular prosthetic devices, implants and grafts, initial encounter: Secondary | ICD-10-CM | POA: Diagnosis not present

## 2020-10-29 DIAGNOSIS — Z992 Dependence on renal dialysis: Secondary | ICD-10-CM | POA: Diagnosis not present

## 2020-10-29 DIAGNOSIS — R4182 Altered mental status, unspecified: Secondary | ICD-10-CM | POA: Diagnosis not present

## 2020-10-30 DIAGNOSIS — E1310 Other specified diabetes mellitus with ketoacidosis without coma: Secondary | ICD-10-CM | POA: Diagnosis not present

## 2020-10-30 DIAGNOSIS — R4182 Altered mental status, unspecified: Secondary | ICD-10-CM | POA: Diagnosis not present

## 2020-10-30 DIAGNOSIS — N186 End stage renal disease: Secondary | ICD-10-CM | POA: Diagnosis not present

## 2020-10-30 DIAGNOSIS — I639 Cerebral infarction, unspecified: Secondary | ICD-10-CM | POA: Diagnosis not present

## 2020-10-30 DIAGNOSIS — R579 Shock, unspecified: Secondary | ICD-10-CM | POA: Diagnosis not present

## 2020-10-30 DIAGNOSIS — A498 Other bacterial infections of unspecified site: Secondary | ICD-10-CM | POA: Diagnosis not present

## 2020-10-30 DIAGNOSIS — R278 Other lack of coordination: Secondary | ICD-10-CM | POA: Diagnosis not present

## 2020-10-30 DIAGNOSIS — G9341 Metabolic encephalopathy: Secondary | ICD-10-CM | POA: Diagnosis not present

## 2020-10-30 DIAGNOSIS — I10 Essential (primary) hypertension: Secondary | ICD-10-CM | POA: Diagnosis not present

## 2020-10-30 DIAGNOSIS — I2510 Atherosclerotic heart disease of native coronary artery without angina pectoris: Secondary | ICD-10-CM | POA: Diagnosis not present

## 2020-10-30 DIAGNOSIS — N19 Unspecified kidney failure: Secondary | ICD-10-CM | POA: Diagnosis not present

## 2020-10-30 DIAGNOSIS — Z992 Dependence on renal dialysis: Secondary | ICD-10-CM | POA: Diagnosis not present

## 2020-10-30 DIAGNOSIS — E875 Hyperkalemia: Secondary | ICD-10-CM | POA: Diagnosis not present

## 2020-10-31 DIAGNOSIS — A498 Other bacterial infections of unspecified site: Secondary | ICD-10-CM | POA: Diagnosis not present

## 2020-10-31 DIAGNOSIS — Z452 Encounter for adjustment and management of vascular access device: Secondary | ICD-10-CM | POA: Diagnosis not present

## 2020-10-31 DIAGNOSIS — G9341 Metabolic encephalopathy: Secondary | ICD-10-CM | POA: Diagnosis not present

## 2020-10-31 DIAGNOSIS — E875 Hyperkalemia: Secondary | ICD-10-CM | POA: Diagnosis not present

## 2020-10-31 DIAGNOSIS — R278 Other lack of coordination: Secondary | ICD-10-CM | POA: Diagnosis not present

## 2020-10-31 DIAGNOSIS — I2510 Atherosclerotic heart disease of native coronary artery without angina pectoris: Secondary | ICD-10-CM | POA: Diagnosis not present

## 2020-10-31 DIAGNOSIS — N186 End stage renal disease: Secondary | ICD-10-CM | POA: Diagnosis not present

## 2020-10-31 DIAGNOSIS — Z992 Dependence on renal dialysis: Secondary | ICD-10-CM | POA: Diagnosis not present

## 2020-10-31 DIAGNOSIS — R7881 Bacteremia: Secondary | ICD-10-CM | POA: Diagnosis not present

## 2020-10-31 DIAGNOSIS — E1310 Other specified diabetes mellitus with ketoacidosis without coma: Secondary | ICD-10-CM | POA: Diagnosis not present

## 2020-10-31 DIAGNOSIS — R579 Shock, unspecified: Secondary | ICD-10-CM | POA: Diagnosis not present

## 2020-10-31 DIAGNOSIS — R0689 Other abnormalities of breathing: Secondary | ICD-10-CM | POA: Diagnosis not present

## 2020-10-31 DIAGNOSIS — N19 Unspecified kidney failure: Secondary | ICD-10-CM | POA: Diagnosis not present

## 2020-10-31 DIAGNOSIS — Z4682 Encounter for fitting and adjustment of non-vascular catheter: Secondary | ICD-10-CM | POA: Diagnosis not present

## 2020-10-31 DIAGNOSIS — R4182 Altered mental status, unspecified: Secondary | ICD-10-CM | POA: Diagnosis not present

## 2020-11-01 DIAGNOSIS — R0902 Hypoxemia: Secondary | ICD-10-CM | POA: Diagnosis not present

## 2020-11-01 DIAGNOSIS — Z992 Dependence on renal dialysis: Secondary | ICD-10-CM | POA: Diagnosis not present

## 2020-11-01 DIAGNOSIS — A4150 Gram-negative sepsis, unspecified: Secondary | ICD-10-CM | POA: Diagnosis not present

## 2020-11-01 DIAGNOSIS — J939 Pneumothorax, unspecified: Secondary | ICD-10-CM | POA: Diagnosis not present

## 2020-11-01 DIAGNOSIS — T80211A Bloodstream infection due to central venous catheter, initial encounter: Secondary | ICD-10-CM | POA: Diagnosis not present

## 2020-11-01 DIAGNOSIS — E875 Hyperkalemia: Secondary | ICD-10-CM | POA: Diagnosis not present

## 2020-11-01 DIAGNOSIS — N186 End stage renal disease: Secondary | ICD-10-CM | POA: Diagnosis not present

## 2020-11-01 DIAGNOSIS — R579 Shock, unspecified: Secondary | ICD-10-CM | POA: Diagnosis not present

## 2020-11-01 DIAGNOSIS — N19 Unspecified kidney failure: Secondary | ICD-10-CM | POA: Diagnosis not present

## 2020-11-02 DIAGNOSIS — N19 Unspecified kidney failure: Secondary | ICD-10-CM | POA: Diagnosis not present

## 2020-11-02 DIAGNOSIS — E875 Hyperkalemia: Secondary | ICD-10-CM | POA: Diagnosis not present

## 2020-11-02 DIAGNOSIS — A4150 Gram-negative sepsis, unspecified: Secondary | ICD-10-CM | POA: Diagnosis not present

## 2020-11-02 DIAGNOSIS — T80211A Bloodstream infection due to central venous catheter, initial encounter: Secondary | ICD-10-CM | POA: Diagnosis not present

## 2020-11-02 DIAGNOSIS — Z992 Dependence on renal dialysis: Secondary | ICD-10-CM | POA: Diagnosis not present

## 2020-11-02 DIAGNOSIS — K5730 Diverticulosis of large intestine without perforation or abscess without bleeding: Secondary | ICD-10-CM | POA: Diagnosis not present

## 2020-11-02 DIAGNOSIS — R579 Shock, unspecified: Secondary | ICD-10-CM | POA: Diagnosis not present

## 2020-11-02 DIAGNOSIS — N186 End stage renal disease: Secondary | ICD-10-CM | POA: Diagnosis not present

## 2020-11-03 DIAGNOSIS — J90 Pleural effusion, not elsewhere classified: Secondary | ICD-10-CM | POA: Diagnosis not present

## 2020-11-03 DIAGNOSIS — I319 Disease of pericardium, unspecified: Secondary | ICD-10-CM | POA: Diagnosis not present

## 2020-11-03 DIAGNOSIS — N186 End stage renal disease: Secondary | ICD-10-CM | POA: Diagnosis not present

## 2020-11-03 DIAGNOSIS — T80211A Bloodstream infection due to central venous catheter, initial encounter: Secondary | ICD-10-CM | POA: Diagnosis not present

## 2020-11-03 DIAGNOSIS — A4150 Gram-negative sepsis, unspecified: Secondary | ICD-10-CM | POA: Diagnosis not present

## 2020-11-03 DIAGNOSIS — J189 Pneumonia, unspecified organism: Secondary | ICD-10-CM | POA: Diagnosis not present

## 2020-11-03 DIAGNOSIS — N19 Unspecified kidney failure: Secondary | ICD-10-CM | POA: Diagnosis not present

## 2020-11-03 DIAGNOSIS — Z4682 Encounter for fitting and adjustment of non-vascular catheter: Secondary | ICD-10-CM | POA: Diagnosis not present

## 2020-11-03 DIAGNOSIS — E875 Hyperkalemia: Secondary | ICD-10-CM | POA: Diagnosis not present

## 2020-11-03 DIAGNOSIS — Z992 Dependence on renal dialysis: Secondary | ICD-10-CM | POA: Diagnosis not present

## 2020-11-04 DIAGNOSIS — N186 End stage renal disease: Secondary | ICD-10-CM | POA: Diagnosis not present

## 2020-11-04 DIAGNOSIS — E875 Hyperkalemia: Secondary | ICD-10-CM | POA: Diagnosis not present

## 2020-11-04 DIAGNOSIS — N19 Unspecified kidney failure: Secondary | ICD-10-CM | POA: Diagnosis not present

## 2020-11-04 DIAGNOSIS — Z992 Dependence on renal dialysis: Secondary | ICD-10-CM | POA: Diagnosis not present

## 2020-11-06 DIAGNOSIS — N19 Unspecified kidney failure: Secondary | ICD-10-CM | POA: Diagnosis not present

## 2020-11-06 DIAGNOSIS — T80211A Bloodstream infection due to central venous catheter, initial encounter: Secondary | ICD-10-CM | POA: Diagnosis not present

## 2020-11-06 DIAGNOSIS — E875 Hyperkalemia: Secondary | ICD-10-CM | POA: Diagnosis not present

## 2020-11-06 DIAGNOSIS — N186 End stage renal disease: Secondary | ICD-10-CM | POA: Diagnosis not present

## 2020-11-06 DIAGNOSIS — A4150 Gram-negative sepsis, unspecified: Secondary | ICD-10-CM | POA: Diagnosis not present

## 2020-11-06 DIAGNOSIS — Z992 Dependence on renal dialysis: Secondary | ICD-10-CM | POA: Diagnosis not present

## 2020-11-07 DIAGNOSIS — E875 Hyperkalemia: Secondary | ICD-10-CM | POA: Diagnosis not present

## 2020-11-07 DIAGNOSIS — N178 Other acute kidney failure: Secondary | ICD-10-CM | POA: Diagnosis not present

## 2020-11-07 DIAGNOSIS — T82898D Other specified complication of vascular prosthetic devices, implants and grafts, subsequent encounter: Secondary | ICD-10-CM | POA: Diagnosis not present

## 2020-11-07 DIAGNOSIS — R0902 Hypoxemia: Secondary | ICD-10-CM | POA: Diagnosis not present

## 2020-11-07 DIAGNOSIS — T80211A Bloodstream infection due to central venous catheter, initial encounter: Secondary | ICD-10-CM | POA: Diagnosis not present

## 2020-11-07 DIAGNOSIS — N19 Unspecified kidney failure: Secondary | ICD-10-CM | POA: Diagnosis not present

## 2020-11-07 DIAGNOSIS — N186 End stage renal disease: Secondary | ICD-10-CM | POA: Diagnosis not present

## 2020-11-07 DIAGNOSIS — Z992 Dependence on renal dialysis: Secondary | ICD-10-CM | POA: Diagnosis not present

## 2020-11-07 DIAGNOSIS — A4150 Gram-negative sepsis, unspecified: Secondary | ICD-10-CM | POA: Diagnosis not present

## 2020-11-08 DIAGNOSIS — N186 End stage renal disease: Secondary | ICD-10-CM | POA: Diagnosis not present

## 2020-11-08 DIAGNOSIS — T82898D Other specified complication of vascular prosthetic devices, implants and grafts, subsequent encounter: Secondary | ICD-10-CM | POA: Diagnosis not present

## 2020-11-08 DIAGNOSIS — Z992 Dependence on renal dialysis: Secondary | ICD-10-CM | POA: Diagnosis not present

## 2020-11-08 DIAGNOSIS — N19 Unspecified kidney failure: Secondary | ICD-10-CM | POA: Diagnosis not present

## 2020-11-08 DIAGNOSIS — E875 Hyperkalemia: Secondary | ICD-10-CM | POA: Diagnosis not present

## 2020-11-08 DIAGNOSIS — T80211A Bloodstream infection due to central venous catheter, initial encounter: Secondary | ICD-10-CM | POA: Diagnosis not present

## 2020-11-08 DIAGNOSIS — A4150 Gram-negative sepsis, unspecified: Secondary | ICD-10-CM | POA: Diagnosis not present

## 2020-11-09 DIAGNOSIS — R059 Cough, unspecified: Secondary | ICD-10-CM | POA: Diagnosis not present

## 2020-11-09 DIAGNOSIS — E875 Hyperkalemia: Secondary | ICD-10-CM | POA: Diagnosis not present

## 2020-11-09 DIAGNOSIS — A4150 Gram-negative sepsis, unspecified: Secondary | ICD-10-CM | POA: Diagnosis not present

## 2020-11-09 DIAGNOSIS — Z992 Dependence on renal dialysis: Secondary | ICD-10-CM | POA: Diagnosis not present

## 2020-11-09 DIAGNOSIS — N186 End stage renal disease: Secondary | ICD-10-CM | POA: Diagnosis not present

## 2020-11-09 DIAGNOSIS — T82898D Other specified complication of vascular prosthetic devices, implants and grafts, subsequent encounter: Secondary | ICD-10-CM | POA: Diagnosis not present

## 2020-11-09 DIAGNOSIS — T80211A Bloodstream infection due to central venous catheter, initial encounter: Secondary | ICD-10-CM | POA: Diagnosis not present

## 2020-11-09 DIAGNOSIS — N19 Unspecified kidney failure: Secondary | ICD-10-CM | POA: Diagnosis not present

## 2020-11-10 DIAGNOSIS — R4182 Altered mental status, unspecified: Secondary | ICD-10-CM | POA: Diagnosis not present

## 2020-11-10 DIAGNOSIS — R0600 Dyspnea, unspecified: Secondary | ICD-10-CM | POA: Diagnosis not present

## 2020-11-10 DIAGNOSIS — E875 Hyperkalemia: Secondary | ICD-10-CM | POA: Diagnosis not present

## 2020-11-10 DIAGNOSIS — A4150 Gram-negative sepsis, unspecified: Secondary | ICD-10-CM | POA: Diagnosis not present

## 2020-11-10 DIAGNOSIS — T80211A Bloodstream infection due to central venous catheter, initial encounter: Secondary | ICD-10-CM | POA: Diagnosis not present

## 2020-11-10 DIAGNOSIS — J80 Acute respiratory distress syndrome: Secondary | ICD-10-CM | POA: Diagnosis not present

## 2020-11-10 DIAGNOSIS — N19 Unspecified kidney failure: Secondary | ICD-10-CM | POA: Diagnosis not present

## 2020-11-10 DIAGNOSIS — Z992 Dependence on renal dialysis: Secondary | ICD-10-CM | POA: Diagnosis not present

## 2020-11-10 DIAGNOSIS — Z515 Encounter for palliative care: Secondary | ICD-10-CM | POA: Diagnosis not present

## 2020-11-10 DIAGNOSIS — M6281 Muscle weakness (generalized): Secondary | ICD-10-CM | POA: Diagnosis not present

## 2020-11-10 DIAGNOSIS — N186 End stage renal disease: Secondary | ICD-10-CM | POA: Diagnosis not present

## 2020-11-13 DIAGNOSIS — N19 Unspecified kidney failure: Secondary | ICD-10-CM | POA: Diagnosis not present

## 2020-11-13 DIAGNOSIS — N186 End stage renal disease: Secondary | ICD-10-CM | POA: Diagnosis not present

## 2020-11-13 DIAGNOSIS — E875 Hyperkalemia: Secondary | ICD-10-CM | POA: Diagnosis not present

## 2020-11-13 DIAGNOSIS — T80211A Bloodstream infection due to central venous catheter, initial encounter: Secondary | ICD-10-CM | POA: Diagnosis not present

## 2020-11-13 DIAGNOSIS — R4182 Altered mental status, unspecified: Secondary | ICD-10-CM | POA: Diagnosis not present

## 2020-11-13 DIAGNOSIS — Z992 Dependence on renal dialysis: Secondary | ICD-10-CM | POA: Diagnosis not present

## 2020-11-13 DIAGNOSIS — A4150 Gram-negative sepsis, unspecified: Secondary | ICD-10-CM | POA: Diagnosis not present

## 2020-11-14 DIAGNOSIS — A4150 Gram-negative sepsis, unspecified: Secondary | ICD-10-CM | POA: Diagnosis not present

## 2020-11-14 DIAGNOSIS — T80211A Bloodstream infection due to central venous catheter, initial encounter: Secondary | ICD-10-CM | POA: Diagnosis not present

## 2020-11-14 DIAGNOSIS — N19 Unspecified kidney failure: Secondary | ICD-10-CM | POA: Diagnosis not present

## 2020-11-14 DIAGNOSIS — Z992 Dependence on renal dialysis: Secondary | ICD-10-CM | POA: Diagnosis not present

## 2020-11-14 DIAGNOSIS — N186 End stage renal disease: Secondary | ICD-10-CM | POA: Diagnosis not present

## 2020-11-14 DIAGNOSIS — T82868A Thrombosis of vascular prosthetic devices, implants and grafts, initial encounter: Secondary | ICD-10-CM | POA: Diagnosis not present

## 2020-11-14 DIAGNOSIS — E875 Hyperkalemia: Secondary | ICD-10-CM | POA: Diagnosis not present

## 2020-11-15 DIAGNOSIS — N186 End stage renal disease: Secondary | ICD-10-CM | POA: Diagnosis not present

## 2020-11-15 DIAGNOSIS — A4150 Gram-negative sepsis, unspecified: Secondary | ICD-10-CM | POA: Diagnosis not present

## 2020-11-15 DIAGNOSIS — E875 Hyperkalemia: Secondary | ICD-10-CM | POA: Diagnosis not present

## 2020-11-15 DIAGNOSIS — N19 Unspecified kidney failure: Secondary | ICD-10-CM | POA: Diagnosis not present

## 2020-11-15 DIAGNOSIS — Z992 Dependence on renal dialysis: Secondary | ICD-10-CM | POA: Diagnosis not present

## 2020-11-15 DIAGNOSIS — T80211A Bloodstream infection due to central venous catheter, initial encounter: Secondary | ICD-10-CM | POA: Diagnosis not present

## 2020-11-16 DIAGNOSIS — T80211A Bloodstream infection due to central venous catheter, initial encounter: Secondary | ICD-10-CM | POA: Diagnosis not present

## 2020-11-16 DIAGNOSIS — A4150 Gram-negative sepsis, unspecified: Secondary | ICD-10-CM | POA: Diagnosis not present

## 2020-11-16 DIAGNOSIS — E875 Hyperkalemia: Secondary | ICD-10-CM | POA: Diagnosis not present

## 2020-11-16 DIAGNOSIS — N186 End stage renal disease: Secondary | ICD-10-CM | POA: Diagnosis not present

## 2020-11-16 DIAGNOSIS — Z992 Dependence on renal dialysis: Secondary | ICD-10-CM | POA: Diagnosis not present

## 2020-11-16 DIAGNOSIS — N19 Unspecified kidney failure: Secondary | ICD-10-CM | POA: Diagnosis not present

## 2020-11-17 DIAGNOSIS — T80211A Bloodstream infection due to central venous catheter, initial encounter: Secondary | ICD-10-CM | POA: Diagnosis not present

## 2020-11-17 DIAGNOSIS — N186 End stage renal disease: Secondary | ICD-10-CM | POA: Diagnosis not present

## 2020-11-17 DIAGNOSIS — Z992 Dependence on renal dialysis: Secondary | ICD-10-CM | POA: Diagnosis not present

## 2020-11-17 DIAGNOSIS — A4150 Gram-negative sepsis, unspecified: Secondary | ICD-10-CM | POA: Diagnosis not present

## 2020-11-17 DIAGNOSIS — N19 Unspecified kidney failure: Secondary | ICD-10-CM | POA: Diagnosis not present

## 2020-11-17 DIAGNOSIS — E875 Hyperkalemia: Secondary | ICD-10-CM | POA: Diagnosis not present

## 2020-11-20 DIAGNOSIS — Z992 Dependence on renal dialysis: Secondary | ICD-10-CM | POA: Diagnosis not present

## 2020-11-20 DIAGNOSIS — A4150 Gram-negative sepsis, unspecified: Secondary | ICD-10-CM | POA: Diagnosis not present

## 2020-11-20 DIAGNOSIS — T80211A Bloodstream infection due to central venous catheter, initial encounter: Secondary | ICD-10-CM | POA: Diagnosis not present

## 2020-11-20 DIAGNOSIS — E875 Hyperkalemia: Secondary | ICD-10-CM | POA: Diagnosis not present

## 2020-11-20 DIAGNOSIS — N19 Unspecified kidney failure: Secondary | ICD-10-CM | POA: Diagnosis not present

## 2020-11-20 DIAGNOSIS — N186 End stage renal disease: Secondary | ICD-10-CM | POA: Diagnosis not present

## 2020-11-21 DIAGNOSIS — N19 Unspecified kidney failure: Secondary | ICD-10-CM | POA: Diagnosis not present

## 2020-11-21 DIAGNOSIS — E875 Hyperkalemia: Secondary | ICD-10-CM | POA: Diagnosis not present

## 2020-11-21 DIAGNOSIS — A4150 Gram-negative sepsis, unspecified: Secondary | ICD-10-CM | POA: Diagnosis not present

## 2020-11-21 DIAGNOSIS — N186 End stage renal disease: Secondary | ICD-10-CM | POA: Diagnosis not present

## 2020-11-21 DIAGNOSIS — Z992 Dependence on renal dialysis: Secondary | ICD-10-CM | POA: Diagnosis not present

## 2020-11-21 DIAGNOSIS — T80211A Bloodstream infection due to central venous catheter, initial encounter: Secondary | ICD-10-CM | POA: Diagnosis not present

## 2020-11-22 DIAGNOSIS — E875 Hyperkalemia: Secondary | ICD-10-CM | POA: Diagnosis not present

## 2020-11-22 DIAGNOSIS — M79674 Pain in right toe(s): Secondary | ICD-10-CM | POA: Diagnosis not present

## 2020-11-22 DIAGNOSIS — A4150 Gram-negative sepsis, unspecified: Secondary | ICD-10-CM | POA: Diagnosis not present

## 2020-11-22 DIAGNOSIS — Z992 Dependence on renal dialysis: Secondary | ICD-10-CM | POA: Diagnosis not present

## 2020-11-22 DIAGNOSIS — N19 Unspecified kidney failure: Secondary | ICD-10-CM | POA: Diagnosis not present

## 2020-11-22 DIAGNOSIS — L089 Local infection of the skin and subcutaneous tissue, unspecified: Secondary | ICD-10-CM | POA: Diagnosis not present

## 2020-11-22 DIAGNOSIS — T80211A Bloodstream infection due to central venous catheter, initial encounter: Secondary | ICD-10-CM | POA: Diagnosis not present

## 2020-11-22 DIAGNOSIS — Z4901 Encounter for fitting and adjustment of extracorporeal dialysis catheter: Secondary | ICD-10-CM | POA: Diagnosis not present

## 2020-11-22 DIAGNOSIS — N186 End stage renal disease: Secondary | ICD-10-CM | POA: Diagnosis not present

## 2020-11-23 DIAGNOSIS — M79671 Pain in right foot: Secondary | ICD-10-CM | POA: Diagnosis not present

## 2020-11-23 DIAGNOSIS — A4150 Gram-negative sepsis, unspecified: Secondary | ICD-10-CM | POA: Diagnosis not present

## 2020-11-23 DIAGNOSIS — E875 Hyperkalemia: Secondary | ICD-10-CM | POA: Diagnosis not present

## 2020-11-23 DIAGNOSIS — E1142 Type 2 diabetes mellitus with diabetic polyneuropathy: Secondary | ICD-10-CM | POA: Diagnosis not present

## 2020-11-23 DIAGNOSIS — T80211A Bloodstream infection due to central venous catheter, initial encounter: Secondary | ICD-10-CM | POA: Diagnosis not present

## 2020-11-23 DIAGNOSIS — N186 End stage renal disease: Secondary | ICD-10-CM | POA: Diagnosis not present

## 2020-11-23 DIAGNOSIS — N19 Unspecified kidney failure: Secondary | ICD-10-CM | POA: Diagnosis not present

## 2020-11-23 DIAGNOSIS — Z992 Dependence on renal dialysis: Secondary | ICD-10-CM | POA: Diagnosis not present

## 2020-11-23 DIAGNOSIS — L02611 Cutaneous abscess of right foot: Secondary | ICD-10-CM | POA: Diagnosis not present

## 2020-11-23 DIAGNOSIS — M65871 Other synovitis and tenosynovitis, right ankle and foot: Secondary | ICD-10-CM | POA: Diagnosis not present

## 2020-11-24 DIAGNOSIS — I120 Hypertensive chronic kidney disease with stage 5 chronic kidney disease or end stage renal disease: Secondary | ICD-10-CM | POA: Diagnosis not present

## 2020-11-24 DIAGNOSIS — A4150 Gram-negative sepsis, unspecified: Secondary | ICD-10-CM | POA: Diagnosis not present

## 2020-11-24 DIAGNOSIS — L02611 Cutaneous abscess of right foot: Secondary | ICD-10-CM | POA: Diagnosis not present

## 2020-11-24 DIAGNOSIS — D631 Anemia in chronic kidney disease: Secondary | ICD-10-CM | POA: Diagnosis not present

## 2020-11-24 DIAGNOSIS — T80211A Bloodstream infection due to central venous catheter, initial encounter: Secondary | ICD-10-CM | POA: Diagnosis not present

## 2020-11-24 DIAGNOSIS — N186 End stage renal disease: Secondary | ICD-10-CM | POA: Diagnosis not present

## 2020-11-24 DIAGNOSIS — Z992 Dependence on renal dialysis: Secondary | ICD-10-CM | POA: Diagnosis not present

## 2020-11-24 DIAGNOSIS — E1122 Type 2 diabetes mellitus with diabetic chronic kidney disease: Secondary | ICD-10-CM | POA: Diagnosis not present

## 2020-11-24 DIAGNOSIS — G4733 Obstructive sleep apnea (adult) (pediatric): Secondary | ICD-10-CM | POA: Diagnosis not present

## 2020-11-24 DIAGNOSIS — N19 Unspecified kidney failure: Secondary | ICD-10-CM | POA: Diagnosis not present

## 2020-11-24 DIAGNOSIS — E1140 Type 2 diabetes mellitus with diabetic neuropathy, unspecified: Secondary | ICD-10-CM | POA: Diagnosis not present

## 2020-11-24 DIAGNOSIS — E875 Hyperkalemia: Secondary | ICD-10-CM | POA: Diagnosis not present

## 2020-11-25 DIAGNOSIS — N19 Unspecified kidney failure: Secondary | ICD-10-CM | POA: Diagnosis not present

## 2020-11-25 DIAGNOSIS — Z992 Dependence on renal dialysis: Secondary | ICD-10-CM | POA: Diagnosis not present

## 2020-11-25 DIAGNOSIS — E875 Hyperkalemia: Secondary | ICD-10-CM | POA: Diagnosis not present

## 2020-11-25 DIAGNOSIS — N186 End stage renal disease: Secondary | ICD-10-CM | POA: Diagnosis not present

## 2020-11-26 DIAGNOSIS — R140 Abdominal distension (gaseous): Secondary | ICD-10-CM | POA: Diagnosis not present

## 2020-11-26 DIAGNOSIS — I959 Hypotension, unspecified: Secondary | ICD-10-CM | POA: Diagnosis not present

## 2020-11-26 DIAGNOSIS — K2950 Unspecified chronic gastritis without bleeding: Secondary | ICD-10-CM | POA: Diagnosis not present

## 2020-11-26 DIAGNOSIS — K922 Gastrointestinal hemorrhage, unspecified: Secondary | ICD-10-CM | POA: Diagnosis not present

## 2020-11-26 DIAGNOSIS — Z992 Dependence on renal dialysis: Secondary | ICD-10-CM | POA: Diagnosis not present

## 2020-11-26 DIAGNOSIS — R109 Unspecified abdominal pain: Secondary | ICD-10-CM | POA: Diagnosis not present

## 2020-11-26 DIAGNOSIS — K3189 Other diseases of stomach and duodenum: Secondary | ICD-10-CM | POA: Diagnosis not present

## 2020-11-26 DIAGNOSIS — N186 End stage renal disease: Secondary | ICD-10-CM | POA: Diagnosis not present

## 2020-11-26 DIAGNOSIS — E875 Hyperkalemia: Secondary | ICD-10-CM | POA: Diagnosis not present

## 2020-11-26 DIAGNOSIS — R579 Shock, unspecified: Secondary | ICD-10-CM | POA: Diagnosis not present

## 2020-11-26 DIAGNOSIS — K31A19 Gastric intestinal metaplasia without dysplasia, unspecified site: Secondary | ICD-10-CM | POA: Diagnosis not present

## 2020-11-26 DIAGNOSIS — N19 Unspecified kidney failure: Secondary | ICD-10-CM | POA: Diagnosis not present

## 2020-11-26 DIAGNOSIS — D62 Acute posthemorrhagic anemia: Secondary | ICD-10-CM | POA: Diagnosis not present

## 2020-11-27 DIAGNOSIS — R0600 Dyspnea, unspecified: Secondary | ICD-10-CM | POA: Diagnosis not present

## 2020-11-27 DIAGNOSIS — K264 Chronic or unspecified duodenal ulcer with hemorrhage: Secondary | ICD-10-CM | POA: Diagnosis not present

## 2020-11-27 DIAGNOSIS — E875 Hyperkalemia: Secondary | ICD-10-CM | POA: Diagnosis not present

## 2020-11-27 DIAGNOSIS — N19 Unspecified kidney failure: Secondary | ICD-10-CM | POA: Diagnosis not present

## 2020-11-27 DIAGNOSIS — K922 Gastrointestinal hemorrhage, unspecified: Secondary | ICD-10-CM | POA: Diagnosis not present

## 2020-11-27 DIAGNOSIS — A4150 Gram-negative sepsis, unspecified: Secondary | ICD-10-CM | POA: Diagnosis not present

## 2020-11-27 DIAGNOSIS — N186 End stage renal disease: Secondary | ICD-10-CM | POA: Diagnosis not present

## 2020-11-27 DIAGNOSIS — T80211A Bloodstream infection due to central venous catheter, initial encounter: Secondary | ICD-10-CM | POA: Diagnosis not present

## 2020-11-27 DIAGNOSIS — Z515 Encounter for palliative care: Secondary | ICD-10-CM | POA: Diagnosis not present

## 2020-11-27 DIAGNOSIS — D62 Acute posthemorrhagic anemia: Secondary | ICD-10-CM | POA: Diagnosis not present

## 2020-11-27 DIAGNOSIS — M6281 Muscle weakness (generalized): Secondary | ICD-10-CM | POA: Diagnosis not present

## 2020-11-27 DIAGNOSIS — K2950 Unspecified chronic gastritis without bleeding: Secondary | ICD-10-CM | POA: Diagnosis not present

## 2020-11-27 DIAGNOSIS — I959 Hypotension, unspecified: Secondary | ICD-10-CM | POA: Diagnosis not present

## 2020-11-27 DIAGNOSIS — K31A19 Gastric intestinal metaplasia without dysplasia, unspecified site: Secondary | ICD-10-CM | POA: Diagnosis not present

## 2020-11-27 DIAGNOSIS — R4182 Altered mental status, unspecified: Secondary | ICD-10-CM | POA: Diagnosis not present

## 2020-11-27 DIAGNOSIS — K2210 Ulcer of esophagus without bleeding: Secondary | ICD-10-CM | POA: Diagnosis not present

## 2020-11-27 DIAGNOSIS — Z992 Dependence on renal dialysis: Secondary | ICD-10-CM | POA: Diagnosis not present

## 2020-11-28 DIAGNOSIS — K2950 Unspecified chronic gastritis without bleeding: Secondary | ICD-10-CM | POA: Diagnosis not present

## 2020-11-28 DIAGNOSIS — N19 Unspecified kidney failure: Secondary | ICD-10-CM | POA: Diagnosis not present

## 2020-11-28 DIAGNOSIS — I959 Hypotension, unspecified: Secondary | ICD-10-CM | POA: Diagnosis not present

## 2020-11-28 DIAGNOSIS — E875 Hyperkalemia: Secondary | ICD-10-CM | POA: Diagnosis not present

## 2020-11-28 DIAGNOSIS — T80211A Bloodstream infection due to central venous catheter, initial encounter: Secondary | ICD-10-CM | POA: Diagnosis not present

## 2020-11-28 DIAGNOSIS — K2210 Ulcer of esophagus without bleeding: Secondary | ICD-10-CM | POA: Diagnosis not present

## 2020-11-28 DIAGNOSIS — K31A19 Gastric intestinal metaplasia without dysplasia, unspecified site: Secondary | ICD-10-CM | POA: Diagnosis not present

## 2020-11-28 DIAGNOSIS — K264 Chronic or unspecified duodenal ulcer with hemorrhage: Secondary | ICD-10-CM | POA: Diagnosis not present

## 2020-11-28 DIAGNOSIS — N186 End stage renal disease: Secondary | ICD-10-CM | POA: Diagnosis not present

## 2020-11-28 DIAGNOSIS — A4150 Gram-negative sepsis, unspecified: Secondary | ICD-10-CM | POA: Diagnosis not present

## 2020-11-28 DIAGNOSIS — K922 Gastrointestinal hemorrhage, unspecified: Secondary | ICD-10-CM | POA: Diagnosis not present

## 2020-11-28 DIAGNOSIS — D62 Acute posthemorrhagic anemia: Secondary | ICD-10-CM | POA: Diagnosis not present

## 2020-11-28 DIAGNOSIS — Z992 Dependence on renal dialysis: Secondary | ICD-10-CM | POA: Diagnosis not present

## 2020-11-29 DIAGNOSIS — N19 Unspecified kidney failure: Secondary | ICD-10-CM | POA: Diagnosis not present

## 2020-11-29 DIAGNOSIS — E875 Hyperkalemia: Secondary | ICD-10-CM | POA: Diagnosis not present

## 2020-11-29 DIAGNOSIS — A4150 Gram-negative sepsis, unspecified: Secondary | ICD-10-CM | POA: Diagnosis not present

## 2020-11-29 DIAGNOSIS — I959 Hypotension, unspecified: Secondary | ICD-10-CM | POA: Diagnosis not present

## 2020-11-29 DIAGNOSIS — K922 Gastrointestinal hemorrhage, unspecified: Secondary | ICD-10-CM | POA: Diagnosis not present

## 2020-11-29 DIAGNOSIS — D62 Acute posthemorrhagic anemia: Secondary | ICD-10-CM | POA: Diagnosis not present

## 2020-11-29 DIAGNOSIS — T80211A Bloodstream infection due to central venous catheter, initial encounter: Secondary | ICD-10-CM | POA: Diagnosis not present

## 2020-11-29 DIAGNOSIS — N186 End stage renal disease: Secondary | ICD-10-CM | POA: Diagnosis not present

## 2020-11-29 DIAGNOSIS — Z992 Dependence on renal dialysis: Secondary | ICD-10-CM | POA: Diagnosis not present

## 2020-11-30 DIAGNOSIS — A4150 Gram-negative sepsis, unspecified: Secondary | ICD-10-CM | POA: Diagnosis not present

## 2020-11-30 DIAGNOSIS — E875 Hyperkalemia: Secondary | ICD-10-CM | POA: Diagnosis not present

## 2020-11-30 DIAGNOSIS — Z992 Dependence on renal dialysis: Secondary | ICD-10-CM | POA: Diagnosis not present

## 2020-11-30 DIAGNOSIS — N186 End stage renal disease: Secondary | ICD-10-CM | POA: Diagnosis not present

## 2020-11-30 DIAGNOSIS — N19 Unspecified kidney failure: Secondary | ICD-10-CM | POA: Diagnosis not present

## 2020-11-30 DIAGNOSIS — T80211A Bloodstream infection due to central venous catheter, initial encounter: Secondary | ICD-10-CM | POA: Diagnosis not present

## 2020-12-01 DIAGNOSIS — N186 End stage renal disease: Secondary | ICD-10-CM | POA: Diagnosis not present

## 2020-12-01 DIAGNOSIS — T80211A Bloodstream infection due to central venous catheter, initial encounter: Secondary | ICD-10-CM | POA: Diagnosis not present

## 2020-12-01 DIAGNOSIS — Z992 Dependence on renal dialysis: Secondary | ICD-10-CM | POA: Diagnosis not present

## 2020-12-01 DIAGNOSIS — N19 Unspecified kidney failure: Secondary | ICD-10-CM | POA: Diagnosis not present

## 2020-12-01 DIAGNOSIS — A4150 Gram-negative sepsis, unspecified: Secondary | ICD-10-CM | POA: Diagnosis not present

## 2020-12-01 DIAGNOSIS — E875 Hyperkalemia: Secondary | ICD-10-CM | POA: Diagnosis not present

## 2020-12-04 DIAGNOSIS — E875 Hyperkalemia: Secondary | ICD-10-CM | POA: Diagnosis not present

## 2020-12-04 DIAGNOSIS — N19 Unspecified kidney failure: Secondary | ICD-10-CM | POA: Diagnosis not present

## 2020-12-04 DIAGNOSIS — K922 Gastrointestinal hemorrhage, unspecified: Secondary | ICD-10-CM | POA: Diagnosis not present

## 2020-12-04 DIAGNOSIS — A4150 Gram-negative sepsis, unspecified: Secondary | ICD-10-CM | POA: Diagnosis not present

## 2020-12-04 DIAGNOSIS — Z992 Dependence on renal dialysis: Secondary | ICD-10-CM | POA: Diagnosis not present

## 2020-12-04 DIAGNOSIS — T80211A Bloodstream infection due to central venous catheter, initial encounter: Secondary | ICD-10-CM | POA: Diagnosis not present

## 2020-12-04 DIAGNOSIS — N186 End stage renal disease: Secondary | ICD-10-CM | POA: Diagnosis not present

## 2020-12-05 DIAGNOSIS — Z992 Dependence on renal dialysis: Secondary | ICD-10-CM | POA: Diagnosis not present

## 2020-12-05 DIAGNOSIS — N19 Unspecified kidney failure: Secondary | ICD-10-CM | POA: Diagnosis not present

## 2020-12-05 DIAGNOSIS — A4150 Gram-negative sepsis, unspecified: Secondary | ICD-10-CM | POA: Diagnosis not present

## 2020-12-05 DIAGNOSIS — T80211A Bloodstream infection due to central venous catheter, initial encounter: Secondary | ICD-10-CM | POA: Diagnosis not present

## 2020-12-05 DIAGNOSIS — K922 Gastrointestinal hemorrhage, unspecified: Secondary | ICD-10-CM | POA: Diagnosis not present

## 2020-12-05 DIAGNOSIS — E875 Hyperkalemia: Secondary | ICD-10-CM | POA: Diagnosis not present

## 2020-12-05 DIAGNOSIS — N186 End stage renal disease: Secondary | ICD-10-CM | POA: Diagnosis not present

## 2020-12-06 DIAGNOSIS — N19 Unspecified kidney failure: Secondary | ICD-10-CM | POA: Diagnosis not present

## 2020-12-06 DIAGNOSIS — T80211A Bloodstream infection due to central venous catheter, initial encounter: Secondary | ICD-10-CM | POA: Diagnosis not present

## 2020-12-06 DIAGNOSIS — T82898A Other specified complication of vascular prosthetic devices, implants and grafts, initial encounter: Secondary | ICD-10-CM | POA: Diagnosis not present

## 2020-12-06 DIAGNOSIS — N186 End stage renal disease: Secondary | ICD-10-CM | POA: Diagnosis not present

## 2020-12-06 DIAGNOSIS — Z992 Dependence on renal dialysis: Secondary | ICD-10-CM | POA: Diagnosis not present

## 2020-12-06 DIAGNOSIS — K269 Duodenal ulcer, unspecified as acute or chronic, without hemorrhage or perforation: Secondary | ICD-10-CM | POA: Diagnosis not present

## 2020-12-06 DIAGNOSIS — K922 Gastrointestinal hemorrhage, unspecified: Secondary | ICD-10-CM | POA: Diagnosis not present

## 2020-12-06 DIAGNOSIS — K648 Other hemorrhoids: Secondary | ICD-10-CM | POA: Diagnosis not present

## 2020-12-06 DIAGNOSIS — A4150 Gram-negative sepsis, unspecified: Secondary | ICD-10-CM | POA: Diagnosis not present

## 2020-12-07 DIAGNOSIS — A4150 Gram-negative sepsis, unspecified: Secondary | ICD-10-CM | POA: Diagnosis not present

## 2020-12-07 DIAGNOSIS — Z4901 Encounter for fitting and adjustment of extracorporeal dialysis catheter: Secondary | ICD-10-CM | POA: Diagnosis not present

## 2020-12-07 DIAGNOSIS — N19 Unspecified kidney failure: Secondary | ICD-10-CM | POA: Diagnosis not present

## 2020-12-07 DIAGNOSIS — D631 Anemia in chronic kidney disease: Secondary | ICD-10-CM | POA: Diagnosis not present

## 2020-12-07 DIAGNOSIS — Z992 Dependence on renal dialysis: Secondary | ICD-10-CM | POA: Diagnosis not present

## 2020-12-07 DIAGNOSIS — K922 Gastrointestinal hemorrhage, unspecified: Secondary | ICD-10-CM | POA: Diagnosis not present

## 2020-12-07 DIAGNOSIS — T80211A Bloodstream infection due to central venous catheter, initial encounter: Secondary | ICD-10-CM | POA: Diagnosis not present

## 2020-12-07 DIAGNOSIS — K269 Duodenal ulcer, unspecified as acute or chronic, without hemorrhage or perforation: Secondary | ICD-10-CM | POA: Diagnosis not present

## 2020-12-07 DIAGNOSIS — T8249XA Other complication of vascular dialysis catheter, initial encounter: Secondary | ICD-10-CM | POA: Diagnosis not present

## 2020-12-07 DIAGNOSIS — N186 End stage renal disease: Secondary | ICD-10-CM | POA: Diagnosis not present

## 2020-12-07 DIAGNOSIS — K648 Other hemorrhoids: Secondary | ICD-10-CM | POA: Diagnosis not present

## 2020-12-08 DIAGNOSIS — T80211D Bloodstream infection due to central venous catheter, subsequent encounter: Secondary | ICD-10-CM | POA: Diagnosis not present

## 2020-12-08 DIAGNOSIS — A4150 Gram-negative sepsis, unspecified: Secondary | ICD-10-CM | POA: Diagnosis not present

## 2020-12-08 DIAGNOSIS — N186 End stage renal disease: Secondary | ICD-10-CM | POA: Diagnosis not present

## 2020-12-08 DIAGNOSIS — Z992 Dependence on renal dialysis: Secondary | ICD-10-CM | POA: Diagnosis not present

## 2020-12-08 DIAGNOSIS — T80211A Bloodstream infection due to central venous catheter, initial encounter: Secondary | ICD-10-CM | POA: Diagnosis not present

## 2020-12-08 DIAGNOSIS — D631 Anemia in chronic kidney disease: Secondary | ICD-10-CM | POA: Diagnosis not present

## 2020-12-08 DIAGNOSIS — N19 Unspecified kidney failure: Secondary | ICD-10-CM | POA: Diagnosis not present

## 2020-12-10 DIAGNOSIS — R1011 Right upper quadrant pain: Secondary | ICD-10-CM | POA: Diagnosis not present

## 2020-12-10 DIAGNOSIS — J90 Pleural effusion, not elsewhere classified: Secondary | ICD-10-CM | POA: Diagnosis not present

## 2020-12-10 DIAGNOSIS — R109 Unspecified abdominal pain: Secondary | ICD-10-CM | POA: Diagnosis not present

## 2020-12-11 DIAGNOSIS — D631 Anemia in chronic kidney disease: Secondary | ICD-10-CM | POA: Diagnosis not present

## 2020-12-11 DIAGNOSIS — J90 Pleural effusion, not elsewhere classified: Secondary | ICD-10-CM | POA: Diagnosis not present

## 2020-12-11 DIAGNOSIS — Z992 Dependence on renal dialysis: Secondary | ICD-10-CM | POA: Diagnosis not present

## 2020-12-11 DIAGNOSIS — K922 Gastrointestinal hemorrhage, unspecified: Secondary | ICD-10-CM | POA: Diagnosis not present

## 2020-12-11 DIAGNOSIS — R911 Solitary pulmonary nodule: Secondary | ICD-10-CM | POA: Diagnosis not present

## 2020-12-11 DIAGNOSIS — A4150 Gram-negative sepsis, unspecified: Secondary | ICD-10-CM | POA: Diagnosis not present

## 2020-12-11 DIAGNOSIS — R0602 Shortness of breath: Secondary | ICD-10-CM | POA: Diagnosis not present

## 2020-12-11 DIAGNOSIS — T80211A Bloodstream infection due to central venous catheter, initial encounter: Secondary | ICD-10-CM | POA: Diagnosis not present

## 2020-12-11 DIAGNOSIS — N19 Unspecified kidney failure: Secondary | ICD-10-CM | POA: Diagnosis not present

## 2020-12-11 DIAGNOSIS — N186 End stage renal disease: Secondary | ICD-10-CM | POA: Diagnosis not present

## 2020-12-12 DIAGNOSIS — K922 Gastrointestinal hemorrhage, unspecified: Secondary | ICD-10-CM | POA: Diagnosis not present

## 2020-12-12 DIAGNOSIS — A4150 Gram-negative sepsis, unspecified: Secondary | ICD-10-CM | POA: Diagnosis not present

## 2020-12-12 DIAGNOSIS — Z992 Dependence on renal dialysis: Secondary | ICD-10-CM | POA: Diagnosis not present

## 2020-12-12 DIAGNOSIS — N186 End stage renal disease: Secondary | ICD-10-CM | POA: Diagnosis not present

## 2020-12-12 DIAGNOSIS — N19 Unspecified kidney failure: Secondary | ICD-10-CM | POA: Diagnosis not present

## 2020-12-12 DIAGNOSIS — D631 Anemia in chronic kidney disease: Secondary | ICD-10-CM | POA: Diagnosis not present

## 2020-12-12 DIAGNOSIS — T80211A Bloodstream infection due to central venous catheter, initial encounter: Secondary | ICD-10-CM | POA: Diagnosis not present

## 2020-12-13 DIAGNOSIS — Z992 Dependence on renal dialysis: Secondary | ICD-10-CM | POA: Diagnosis not present

## 2020-12-13 DIAGNOSIS — K922 Gastrointestinal hemorrhage, unspecified: Secondary | ICD-10-CM | POA: Diagnosis not present

## 2020-12-13 DIAGNOSIS — N186 End stage renal disease: Secondary | ICD-10-CM | POA: Diagnosis not present

## 2020-12-13 DIAGNOSIS — N19 Unspecified kidney failure: Secondary | ICD-10-CM | POA: Diagnosis not present

## 2020-12-13 DIAGNOSIS — D631 Anemia in chronic kidney disease: Secondary | ICD-10-CM | POA: Diagnosis not present

## 2020-12-13 DIAGNOSIS — T80211A Bloodstream infection due to central venous catheter, initial encounter: Secondary | ICD-10-CM | POA: Diagnosis not present

## 2020-12-13 DIAGNOSIS — A4150 Gram-negative sepsis, unspecified: Secondary | ICD-10-CM | POA: Diagnosis not present

## 2020-12-14 DIAGNOSIS — D631 Anemia in chronic kidney disease: Secondary | ICD-10-CM | POA: Diagnosis not present

## 2020-12-14 DIAGNOSIS — N19 Unspecified kidney failure: Secondary | ICD-10-CM | POA: Diagnosis not present

## 2020-12-14 DIAGNOSIS — A4150 Gram-negative sepsis, unspecified: Secondary | ICD-10-CM | POA: Diagnosis not present

## 2020-12-14 DIAGNOSIS — T80211A Bloodstream infection due to central venous catheter, initial encounter: Secondary | ICD-10-CM | POA: Diagnosis not present

## 2020-12-14 DIAGNOSIS — N186 End stage renal disease: Secondary | ICD-10-CM | POA: Diagnosis not present

## 2020-12-14 DIAGNOSIS — K922 Gastrointestinal hemorrhage, unspecified: Secondary | ICD-10-CM | POA: Diagnosis not present

## 2020-12-14 DIAGNOSIS — Z992 Dependence on renal dialysis: Secondary | ICD-10-CM | POA: Diagnosis not present

## 2020-12-15 DIAGNOSIS — Z992 Dependence on renal dialysis: Secondary | ICD-10-CM | POA: Diagnosis not present

## 2020-12-15 DIAGNOSIS — A4150 Gram-negative sepsis, unspecified: Secondary | ICD-10-CM | POA: Diagnosis not present

## 2020-12-15 DIAGNOSIS — N19 Unspecified kidney failure: Secondary | ICD-10-CM | POA: Diagnosis not present

## 2020-12-15 DIAGNOSIS — N186 End stage renal disease: Secondary | ICD-10-CM | POA: Diagnosis not present

## 2020-12-15 DIAGNOSIS — D631 Anemia in chronic kidney disease: Secondary | ICD-10-CM | POA: Diagnosis not present

## 2020-12-15 DIAGNOSIS — K922 Gastrointestinal hemorrhage, unspecified: Secondary | ICD-10-CM | POA: Diagnosis not present

## 2020-12-15 DIAGNOSIS — T80211A Bloodstream infection due to central venous catheter, initial encounter: Secondary | ICD-10-CM | POA: Diagnosis not present

## 2020-12-16 DIAGNOSIS — D631 Anemia in chronic kidney disease: Secondary | ICD-10-CM | POA: Diagnosis not present

## 2020-12-16 DIAGNOSIS — R4182 Altered mental status, unspecified: Secondary | ICD-10-CM | POA: Diagnosis not present

## 2020-12-16 DIAGNOSIS — R0600 Dyspnea, unspecified: Secondary | ICD-10-CM | POA: Diagnosis not present

## 2020-12-16 DIAGNOSIS — Z992 Dependence on renal dialysis: Secondary | ICD-10-CM | POA: Diagnosis not present

## 2020-12-16 DIAGNOSIS — Z515 Encounter for palliative care: Secondary | ICD-10-CM | POA: Diagnosis not present

## 2020-12-16 DIAGNOSIS — T82898A Other specified complication of vascular prosthetic devices, implants and grafts, initial encounter: Secondary | ICD-10-CM | POA: Diagnosis not present

## 2020-12-16 DIAGNOSIS — K922 Gastrointestinal hemorrhage, unspecified: Secondary | ICD-10-CM | POA: Diagnosis not present

## 2020-12-16 DIAGNOSIS — M6281 Muscle weakness (generalized): Secondary | ICD-10-CM | POA: Diagnosis not present

## 2020-12-16 DIAGNOSIS — N19 Unspecified kidney failure: Secondary | ICD-10-CM | POA: Diagnosis not present

## 2020-12-16 DIAGNOSIS — N186 End stage renal disease: Secondary | ICD-10-CM | POA: Diagnosis not present

## 2020-12-17 DIAGNOSIS — N186 End stage renal disease: Secondary | ICD-10-CM | POA: Diagnosis not present

## 2020-12-17 DIAGNOSIS — T82898A Other specified complication of vascular prosthetic devices, implants and grafts, initial encounter: Secondary | ICD-10-CM | POA: Diagnosis not present

## 2020-12-17 DIAGNOSIS — K922 Gastrointestinal hemorrhage, unspecified: Secondary | ICD-10-CM | POA: Diagnosis not present

## 2020-12-17 DIAGNOSIS — N19 Unspecified kidney failure: Secondary | ICD-10-CM | POA: Diagnosis not present

## 2020-12-17 DIAGNOSIS — Z992 Dependence on renal dialysis: Secondary | ICD-10-CM | POA: Diagnosis not present

## 2020-12-17 DIAGNOSIS — D631 Anemia in chronic kidney disease: Secondary | ICD-10-CM | POA: Diagnosis not present

## 2020-12-18 DIAGNOSIS — D631 Anemia in chronic kidney disease: Secondary | ICD-10-CM | POA: Diagnosis not present

## 2020-12-18 DIAGNOSIS — N19 Unspecified kidney failure: Secondary | ICD-10-CM | POA: Diagnosis not present

## 2020-12-18 DIAGNOSIS — N186 End stage renal disease: Secondary | ICD-10-CM | POA: Diagnosis not present

## 2020-12-18 DIAGNOSIS — Z992 Dependence on renal dialysis: Secondary | ICD-10-CM | POA: Diagnosis not present

## 2020-12-18 DIAGNOSIS — K922 Gastrointestinal hemorrhage, unspecified: Secondary | ICD-10-CM | POA: Diagnosis not present

## 2020-12-18 DIAGNOSIS — T82898D Other specified complication of vascular prosthetic devices, implants and grafts, subsequent encounter: Secondary | ICD-10-CM | POA: Diagnosis not present

## 2020-12-19 DIAGNOSIS — N186 End stage renal disease: Secondary | ICD-10-CM | POA: Diagnosis not present

## 2020-12-19 DIAGNOSIS — T82898D Other specified complication of vascular prosthetic devices, implants and grafts, subsequent encounter: Secondary | ICD-10-CM | POA: Diagnosis not present

## 2020-12-19 DIAGNOSIS — N19 Unspecified kidney failure: Secondary | ICD-10-CM | POA: Diagnosis not present

## 2020-12-19 DIAGNOSIS — K922 Gastrointestinal hemorrhage, unspecified: Secondary | ICD-10-CM | POA: Diagnosis not present

## 2020-12-19 DIAGNOSIS — D631 Anemia in chronic kidney disease: Secondary | ICD-10-CM | POA: Diagnosis not present

## 2020-12-19 DIAGNOSIS — Z992 Dependence on renal dialysis: Secondary | ICD-10-CM | POA: Diagnosis not present

## 2020-12-20 DIAGNOSIS — Z992 Dependence on renal dialysis: Secondary | ICD-10-CM | POA: Diagnosis not present

## 2020-12-20 DIAGNOSIS — T82898D Other specified complication of vascular prosthetic devices, implants and grafts, subsequent encounter: Secondary | ICD-10-CM | POA: Diagnosis not present

## 2020-12-20 DIAGNOSIS — N19 Unspecified kidney failure: Secondary | ICD-10-CM | POA: Diagnosis not present

## 2020-12-20 DIAGNOSIS — N186 End stage renal disease: Secondary | ICD-10-CM | POA: Diagnosis not present

## 2020-12-20 DIAGNOSIS — D631 Anemia in chronic kidney disease: Secondary | ICD-10-CM | POA: Diagnosis not present

## 2020-12-20 DIAGNOSIS — K922 Gastrointestinal hemorrhage, unspecified: Secondary | ICD-10-CM | POA: Diagnosis not present

## 2020-12-21 DIAGNOSIS — E118 Type 2 diabetes mellitus with unspecified complications: Secondary | ICD-10-CM | POA: Diagnosis not present

## 2020-12-21 DIAGNOSIS — Z4802 Encounter for removal of sutures: Secondary | ICD-10-CM | POA: Diagnosis not present

## 2020-12-21 DIAGNOSIS — I120 Hypertensive chronic kidney disease with stage 5 chronic kidney disease or end stage renal disease: Secondary | ICD-10-CM | POA: Diagnosis not present

## 2020-12-21 DIAGNOSIS — I214 Non-ST elevation (NSTEMI) myocardial infarction: Secondary | ICD-10-CM | POA: Diagnosis not present

## 2020-12-21 DIAGNOSIS — I739 Peripheral vascular disease, unspecified: Secondary | ICD-10-CM | POA: Diagnosis not present

## 2020-12-21 DIAGNOSIS — I255 Ischemic cardiomyopathy: Secondary | ICD-10-CM | POA: Diagnosis not present

## 2020-12-21 DIAGNOSIS — R0600 Dyspnea, unspecified: Secondary | ICD-10-CM | POA: Diagnosis not present

## 2020-12-21 DIAGNOSIS — J9811 Atelectasis: Secondary | ICD-10-CM | POA: Diagnosis not present

## 2020-12-21 DIAGNOSIS — E11649 Type 2 diabetes mellitus with hypoglycemia without coma: Secondary | ICD-10-CM | POA: Diagnosis not present

## 2020-12-21 DIAGNOSIS — A419 Sepsis, unspecified organism: Secondary | ICD-10-CM | POA: Diagnosis not present

## 2020-12-21 DIAGNOSIS — R269 Unspecified abnormalities of gait and mobility: Secondary | ICD-10-CM | POA: Diagnosis not present

## 2020-12-21 DIAGNOSIS — R1312 Dysphagia, oropharyngeal phase: Secondary | ICD-10-CM | POA: Diagnosis not present

## 2020-12-21 DIAGNOSIS — N390 Urinary tract infection, site not specified: Secondary | ICD-10-CM | POA: Diagnosis not present

## 2020-12-21 DIAGNOSIS — E8779 Other fluid overload: Secondary | ICD-10-CM | POA: Diagnosis not present

## 2020-12-21 DIAGNOSIS — R2681 Unsteadiness on feet: Secondary | ICD-10-CM | POA: Diagnosis not present

## 2020-12-21 DIAGNOSIS — K922 Gastrointestinal hemorrhage, unspecified: Secondary | ICD-10-CM | POA: Diagnosis not present

## 2020-12-21 DIAGNOSIS — E871 Hypo-osmolality and hyponatremia: Secondary | ICD-10-CM | POA: Diagnosis not present

## 2020-12-21 DIAGNOSIS — N186 End stage renal disease: Secondary | ICD-10-CM | POA: Diagnosis not present

## 2020-12-21 DIAGNOSIS — K5900 Constipation, unspecified: Secondary | ICD-10-CM | POA: Diagnosis not present

## 2020-12-21 DIAGNOSIS — D500 Iron deficiency anemia secondary to blood loss (chronic): Secondary | ICD-10-CM | POA: Diagnosis not present

## 2020-12-21 DIAGNOSIS — S91301A Unspecified open wound, right foot, initial encounter: Secondary | ICD-10-CM | POA: Diagnosis not present

## 2020-12-21 DIAGNOSIS — E43 Unspecified severe protein-calorie malnutrition: Secondary | ICD-10-CM | POA: Diagnosis not present

## 2020-12-21 DIAGNOSIS — Z20822 Contact with and (suspected) exposure to covid-19: Secondary | ICD-10-CM | POA: Diagnosis not present

## 2020-12-21 DIAGNOSIS — Z743 Need for continuous supervision: Secondary | ICD-10-CM | POA: Diagnosis not present

## 2020-12-21 DIAGNOSIS — E1151 Type 2 diabetes mellitus with diabetic peripheral angiopathy without gangrene: Secondary | ICD-10-CM | POA: Diagnosis not present

## 2020-12-21 DIAGNOSIS — J9691 Respiratory failure, unspecified with hypoxia: Secondary | ICD-10-CM | POA: Diagnosis not present

## 2020-12-21 DIAGNOSIS — J111 Influenza due to unidentified influenza virus with other respiratory manifestations: Secondary | ICD-10-CM | POA: Diagnosis not present

## 2020-12-21 DIAGNOSIS — A4151 Sepsis due to Escherichia coli [E. coli]: Secondary | ICD-10-CM | POA: Diagnosis not present

## 2020-12-21 DIAGNOSIS — D649 Anemia, unspecified: Secondary | ICD-10-CM | POA: Diagnosis not present

## 2020-12-21 DIAGNOSIS — Z515 Encounter for palliative care: Secondary | ICD-10-CM | POA: Diagnosis not present

## 2020-12-21 DIAGNOSIS — R0602 Shortness of breath: Secondary | ICD-10-CM | POA: Diagnosis not present

## 2020-12-21 DIAGNOSIS — H548 Legal blindness, as defined in USA: Secondary | ICD-10-CM | POA: Diagnosis not present

## 2020-12-21 DIAGNOSIS — Z992 Dependence on renal dialysis: Secondary | ICD-10-CM | POA: Diagnosis not present

## 2020-12-21 DIAGNOSIS — J90 Pleural effusion, not elsewhere classified: Secondary | ICD-10-CM | POA: Diagnosis not present

## 2020-12-21 DIAGNOSIS — R279 Unspecified lack of coordination: Secondary | ICD-10-CM | POA: Diagnosis not present

## 2020-12-21 DIAGNOSIS — R109 Unspecified abdominal pain: Secondary | ICD-10-CM | POA: Diagnosis not present

## 2020-12-21 DIAGNOSIS — R278 Other lack of coordination: Secondary | ICD-10-CM | POA: Diagnosis not present

## 2020-12-21 DIAGNOSIS — E1122 Type 2 diabetes mellitus with diabetic chronic kidney disease: Secondary | ICD-10-CM | POA: Diagnosis not present

## 2020-12-21 DIAGNOSIS — T82855A Stenosis of coronary artery stent, initial encounter: Secondary | ICD-10-CM | POA: Diagnosis not present

## 2020-12-21 DIAGNOSIS — M6281 Muscle weakness (generalized): Secondary | ICD-10-CM | POA: Diagnosis not present

## 2020-12-21 DIAGNOSIS — R509 Fever, unspecified: Secondary | ICD-10-CM | POA: Diagnosis not present

## 2020-12-21 DIAGNOSIS — D62 Acute posthemorrhagic anemia: Secondary | ICD-10-CM | POA: Diagnosis not present

## 2020-12-21 DIAGNOSIS — R7881 Bacteremia: Secondary | ICD-10-CM | POA: Diagnosis not present

## 2020-12-21 DIAGNOSIS — E1165 Type 2 diabetes mellitus with hyperglycemia: Secondary | ICD-10-CM | POA: Diagnosis not present

## 2020-12-21 DIAGNOSIS — R296 Repeated falls: Secondary | ICD-10-CM | POA: Diagnosis not present

## 2020-12-21 DIAGNOSIS — I10 Essential (primary) hypertension: Secondary | ICD-10-CM | POA: Diagnosis not present

## 2020-12-21 DIAGNOSIS — M65071 Abscess of tendon sheath, right ankle and foot: Secondary | ICD-10-CM | POA: Diagnosis not present

## 2020-12-21 DIAGNOSIS — I2510 Atherosclerotic heart disease of native coronary artery without angina pectoris: Secondary | ICD-10-CM | POA: Diagnosis not present

## 2020-12-21 DIAGNOSIS — D631 Anemia in chronic kidney disease: Secondary | ICD-10-CM | POA: Diagnosis not present

## 2020-12-21 DIAGNOSIS — R531 Weakness: Secondary | ICD-10-CM | POA: Diagnosis not present

## 2020-12-21 DIAGNOSIS — G9341 Metabolic encephalopathy: Secondary | ICD-10-CM | POA: Diagnosis not present

## 2020-12-21 DIAGNOSIS — L02611 Cutaneous abscess of right foot: Secondary | ICD-10-CM | POA: Diagnosis not present

## 2020-12-21 DIAGNOSIS — E119 Type 2 diabetes mellitus without complications: Secondary | ICD-10-CM | POA: Diagnosis not present

## 2020-12-21 DIAGNOSIS — N19 Unspecified kidney failure: Secondary | ICD-10-CM | POA: Diagnosis not present

## 2020-12-22 DIAGNOSIS — I739 Peripheral vascular disease, unspecified: Secondary | ICD-10-CM | POA: Diagnosis not present

## 2020-12-22 DIAGNOSIS — N186 End stage renal disease: Secondary | ICD-10-CM | POA: Diagnosis not present

## 2020-12-22 DIAGNOSIS — I2510 Atherosclerotic heart disease of native coronary artery without angina pectoris: Secondary | ICD-10-CM | POA: Diagnosis not present

## 2020-12-22 DIAGNOSIS — E118 Type 2 diabetes mellitus with unspecified complications: Secondary | ICD-10-CM | POA: Diagnosis not present

## 2020-12-22 DIAGNOSIS — E8779 Other fluid overload: Secondary | ICD-10-CM | POA: Diagnosis not present

## 2020-12-22 DIAGNOSIS — I10 Essential (primary) hypertension: Secondary | ICD-10-CM | POA: Diagnosis not present

## 2020-12-22 DIAGNOSIS — D631 Anemia in chronic kidney disease: Secondary | ICD-10-CM | POA: Diagnosis not present

## 2020-12-22 DIAGNOSIS — R7881 Bacteremia: Secondary | ICD-10-CM | POA: Diagnosis not present

## 2020-12-22 DIAGNOSIS — M65071 Abscess of tendon sheath, right ankle and foot: Secondary | ICD-10-CM | POA: Diagnosis not present

## 2020-12-25 DIAGNOSIS — M65071 Abscess of tendon sheath, right ankle and foot: Secondary | ICD-10-CM | POA: Diagnosis not present

## 2020-12-25 DIAGNOSIS — E8779 Other fluid overload: Secondary | ICD-10-CM | POA: Diagnosis not present

## 2020-12-25 DIAGNOSIS — E1165 Type 2 diabetes mellitus with hyperglycemia: Secondary | ICD-10-CM | POA: Diagnosis not present

## 2020-12-25 DIAGNOSIS — R7881 Bacteremia: Secondary | ICD-10-CM | POA: Diagnosis not present

## 2020-12-25 DIAGNOSIS — D631 Anemia in chronic kidney disease: Secondary | ICD-10-CM | POA: Diagnosis not present

## 2020-12-25 DIAGNOSIS — N186 End stage renal disease: Secondary | ICD-10-CM | POA: Diagnosis not present

## 2020-12-25 DIAGNOSIS — M6281 Muscle weakness (generalized): Secondary | ICD-10-CM | POA: Diagnosis not present

## 2020-12-26 DIAGNOSIS — N186 End stage renal disease: Secondary | ICD-10-CM | POA: Diagnosis not present

## 2020-12-26 DIAGNOSIS — R2681 Unsteadiness on feet: Secondary | ICD-10-CM | POA: Diagnosis not present

## 2020-12-26 DIAGNOSIS — M6281 Muscle weakness (generalized): Secondary | ICD-10-CM | POA: Diagnosis not present

## 2020-12-26 DIAGNOSIS — D631 Anemia in chronic kidney disease: Secondary | ICD-10-CM | POA: Diagnosis not present

## 2020-12-26 DIAGNOSIS — E8779 Other fluid overload: Secondary | ICD-10-CM | POA: Diagnosis not present

## 2020-12-27 DIAGNOSIS — R7881 Bacteremia: Secondary | ICD-10-CM | POA: Diagnosis not present

## 2020-12-27 DIAGNOSIS — E871 Hypo-osmolality and hyponatremia: Secondary | ICD-10-CM | POA: Diagnosis not present

## 2020-12-27 DIAGNOSIS — E1165 Type 2 diabetes mellitus with hyperglycemia: Secondary | ICD-10-CM | POA: Diagnosis not present

## 2020-12-27 DIAGNOSIS — M65071 Abscess of tendon sheath, right ankle and foot: Secondary | ICD-10-CM | POA: Diagnosis not present

## 2020-12-27 DIAGNOSIS — D631 Anemia in chronic kidney disease: Secondary | ICD-10-CM | POA: Diagnosis not present

## 2020-12-27 DIAGNOSIS — N186 End stage renal disease: Secondary | ICD-10-CM | POA: Diagnosis not present

## 2020-12-27 DIAGNOSIS — E8779 Other fluid overload: Secondary | ICD-10-CM | POA: Diagnosis not present

## 2020-12-28 DIAGNOSIS — E8779 Other fluid overload: Secondary | ICD-10-CM | POA: Diagnosis not present

## 2020-12-28 DIAGNOSIS — M6281 Muscle weakness (generalized): Secondary | ICD-10-CM | POA: Diagnosis not present

## 2020-12-28 DIAGNOSIS — D631 Anemia in chronic kidney disease: Secondary | ICD-10-CM | POA: Diagnosis not present

## 2020-12-28 DIAGNOSIS — N186 End stage renal disease: Secondary | ICD-10-CM | POA: Diagnosis not present

## 2020-12-28 DIAGNOSIS — R2681 Unsteadiness on feet: Secondary | ICD-10-CM | POA: Diagnosis not present

## 2020-12-29 DIAGNOSIS — Z4802 Encounter for removal of sutures: Secondary | ICD-10-CM | POA: Diagnosis not present

## 2020-12-29 DIAGNOSIS — E1165 Type 2 diabetes mellitus with hyperglycemia: Secondary | ICD-10-CM | POA: Diagnosis not present

## 2020-12-29 DIAGNOSIS — M65071 Abscess of tendon sheath, right ankle and foot: Secondary | ICD-10-CM | POA: Diagnosis not present

## 2020-12-29 DIAGNOSIS — N186 End stage renal disease: Secondary | ICD-10-CM | POA: Diagnosis not present

## 2020-12-29 DIAGNOSIS — E8779 Other fluid overload: Secondary | ICD-10-CM | POA: Diagnosis not present

## 2020-12-29 DIAGNOSIS — D631 Anemia in chronic kidney disease: Secondary | ICD-10-CM | POA: Diagnosis not present

## 2020-12-30 DIAGNOSIS — R109 Unspecified abdominal pain: Secondary | ICD-10-CM | POA: Diagnosis not present

## 2021-01-01 DIAGNOSIS — M6281 Muscle weakness (generalized): Secondary | ICD-10-CM | POA: Diagnosis not present

## 2021-01-01 DIAGNOSIS — N390 Urinary tract infection, site not specified: Secondary | ICD-10-CM | POA: Diagnosis not present

## 2021-01-01 DIAGNOSIS — M65071 Abscess of tendon sheath, right ankle and foot: Secondary | ICD-10-CM | POA: Diagnosis not present

## 2021-01-01 DIAGNOSIS — E1165 Type 2 diabetes mellitus with hyperglycemia: Secondary | ICD-10-CM | POA: Diagnosis not present

## 2021-01-01 DIAGNOSIS — D649 Anemia, unspecified: Secondary | ICD-10-CM | POA: Diagnosis not present

## 2021-01-01 DIAGNOSIS — D631 Anemia in chronic kidney disease: Secondary | ICD-10-CM | POA: Diagnosis not present

## 2021-01-01 DIAGNOSIS — K5900 Constipation, unspecified: Secondary | ICD-10-CM | POA: Diagnosis not present

## 2021-01-01 DIAGNOSIS — E8779 Other fluid overload: Secondary | ICD-10-CM | POA: Diagnosis not present

## 2021-01-01 DIAGNOSIS — N186 End stage renal disease: Secondary | ICD-10-CM | POA: Diagnosis not present

## 2021-01-02 DIAGNOSIS — E8779 Other fluid overload: Secondary | ICD-10-CM | POA: Diagnosis not present

## 2021-01-02 DIAGNOSIS — N390 Urinary tract infection, site not specified: Secondary | ICD-10-CM | POA: Diagnosis not present

## 2021-01-02 DIAGNOSIS — R296 Repeated falls: Secondary | ICD-10-CM | POA: Diagnosis not present

## 2021-01-02 DIAGNOSIS — R2681 Unsteadiness on feet: Secondary | ICD-10-CM | POA: Diagnosis not present

## 2021-01-02 DIAGNOSIS — L02611 Cutaneous abscess of right foot: Secondary | ICD-10-CM | POA: Diagnosis not present

## 2021-01-02 DIAGNOSIS — D631 Anemia in chronic kidney disease: Secondary | ICD-10-CM | POA: Diagnosis not present

## 2021-01-02 DIAGNOSIS — N186 End stage renal disease: Secondary | ICD-10-CM | POA: Diagnosis not present

## 2021-01-02 DIAGNOSIS — I739 Peripheral vascular disease, unspecified: Secondary | ICD-10-CM | POA: Diagnosis not present

## 2021-01-02 DIAGNOSIS — R278 Other lack of coordination: Secondary | ICD-10-CM | POA: Diagnosis not present

## 2021-01-02 DIAGNOSIS — E119 Type 2 diabetes mellitus without complications: Secondary | ICD-10-CM | POA: Diagnosis not present

## 2021-01-03 DIAGNOSIS — T82855A Stenosis of coronary artery stent, initial encounter: Secondary | ICD-10-CM | POA: Diagnosis not present

## 2021-01-03 DIAGNOSIS — E43 Unspecified severe protein-calorie malnutrition: Secondary | ICD-10-CM | POA: Diagnosis not present

## 2021-01-03 DIAGNOSIS — J810 Acute pulmonary edema: Secondary | ICD-10-CM | POA: Diagnosis not present

## 2021-01-03 DIAGNOSIS — R1312 Dysphagia, oropharyngeal phase: Secondary | ICD-10-CM | POA: Diagnosis not present

## 2021-01-03 DIAGNOSIS — J90 Pleural effusion, not elsewhere classified: Secondary | ICD-10-CM | POA: Diagnosis not present

## 2021-01-03 DIAGNOSIS — E8779 Other fluid overload: Secondary | ICD-10-CM | POA: Diagnosis not present

## 2021-01-03 DIAGNOSIS — I120 Hypertensive chronic kidney disease with stage 5 chronic kidney disease or end stage renal disease: Secondary | ICD-10-CM | POA: Diagnosis not present

## 2021-01-03 DIAGNOSIS — J111 Influenza due to unidentified influenza virus with other respiratory manifestations: Secondary | ICD-10-CM | POA: Diagnosis not present

## 2021-01-03 DIAGNOSIS — D500 Iron deficiency anemia secondary to blood loss (chronic): Secondary | ICD-10-CM | POA: Diagnosis not present

## 2021-01-03 DIAGNOSIS — J101 Influenza due to other identified influenza virus with other respiratory manifestations: Secondary | ICD-10-CM | POA: Diagnosis not present

## 2021-01-03 DIAGNOSIS — E1122 Type 2 diabetes mellitus with diabetic chronic kidney disease: Secondary | ICD-10-CM | POA: Diagnosis not present

## 2021-01-03 DIAGNOSIS — J811 Chronic pulmonary edema: Secondary | ICD-10-CM | POA: Diagnosis not present

## 2021-01-03 DIAGNOSIS — I255 Ischemic cardiomyopathy: Secondary | ICD-10-CM | POA: Diagnosis not present

## 2021-01-03 DIAGNOSIS — J9691 Respiratory failure, unspecified with hypoxia: Secondary | ICD-10-CM | POA: Diagnosis not present

## 2021-01-03 DIAGNOSIS — A419 Sepsis, unspecified organism: Secondary | ICD-10-CM | POA: Diagnosis not present

## 2021-01-03 DIAGNOSIS — A413 Sepsis due to Hemophilus influenzae: Secondary | ICD-10-CM | POA: Diagnosis not present

## 2021-01-03 DIAGNOSIS — J9811 Atelectasis: Secondary | ICD-10-CM | POA: Diagnosis not present

## 2021-01-03 DIAGNOSIS — M65071 Abscess of tendon sheath, right ankle and foot: Secondary | ICD-10-CM | POA: Diagnosis not present

## 2021-01-03 DIAGNOSIS — I517 Cardiomegaly: Secondary | ICD-10-CM | POA: Diagnosis not present

## 2021-01-03 DIAGNOSIS — N390 Urinary tract infection, site not specified: Secondary | ICD-10-CM | POA: Diagnosis not present

## 2021-01-03 DIAGNOSIS — N19 Unspecified kidney failure: Secondary | ICD-10-CM | POA: Diagnosis not present

## 2021-01-03 DIAGNOSIS — J439 Emphysema, unspecified: Secondary | ICD-10-CM | POA: Diagnosis not present

## 2021-01-03 DIAGNOSIS — S91301A Unspecified open wound, right foot, initial encounter: Secondary | ICD-10-CM | POA: Diagnosis not present

## 2021-01-03 DIAGNOSIS — R509 Fever, unspecified: Secondary | ICD-10-CM | POA: Diagnosis not present

## 2021-01-03 DIAGNOSIS — R269 Unspecified abnormalities of gait and mobility: Secondary | ICD-10-CM | POA: Diagnosis not present

## 2021-01-03 DIAGNOSIS — E1142 Type 2 diabetes mellitus with diabetic polyneuropathy: Secondary | ICD-10-CM | POA: Diagnosis not present

## 2021-01-03 DIAGNOSIS — I361 Nonrheumatic tricuspid (valve) insufficiency: Secondary | ICD-10-CM | POA: Diagnosis not present

## 2021-01-03 DIAGNOSIS — E1151 Type 2 diabetes mellitus with diabetic peripheral angiopathy without gangrene: Secondary | ICD-10-CM | POA: Diagnosis not present

## 2021-01-03 DIAGNOSIS — E118 Type 2 diabetes mellitus with unspecified complications: Secondary | ICD-10-CM | POA: Diagnosis not present

## 2021-01-03 DIAGNOSIS — E11649 Type 2 diabetes mellitus with hypoglycemia without coma: Secondary | ICD-10-CM | POA: Diagnosis not present

## 2021-01-03 DIAGNOSIS — D631 Anemia in chronic kidney disease: Secondary | ICD-10-CM | POA: Diagnosis not present

## 2021-01-03 DIAGNOSIS — R0600 Dyspnea, unspecified: Secondary | ICD-10-CM | POA: Diagnosis not present

## 2021-01-03 DIAGNOSIS — Z20822 Contact with and (suspected) exposure to covid-19: Secondary | ICD-10-CM | POA: Diagnosis not present

## 2021-01-03 DIAGNOSIS — Z992 Dependence on renal dialysis: Secondary | ICD-10-CM | POA: Diagnosis not present

## 2021-01-03 DIAGNOSIS — T8131XA Disruption of external operation (surgical) wound, not elsewhere classified, initial encounter: Secondary | ICD-10-CM | POA: Diagnosis not present

## 2021-01-03 DIAGNOSIS — M6281 Muscle weakness (generalized): Secondary | ICD-10-CM | POA: Diagnosis not present

## 2021-01-03 DIAGNOSIS — Z515 Encounter for palliative care: Secondary | ICD-10-CM | POA: Diagnosis not present

## 2021-01-03 DIAGNOSIS — I214 Non-ST elevation (NSTEMI) myocardial infarction: Secondary | ICD-10-CM | POA: Diagnosis not present

## 2021-01-03 DIAGNOSIS — R0602 Shortness of breath: Secondary | ICD-10-CM | POA: Diagnosis not present

## 2021-01-03 DIAGNOSIS — I272 Pulmonary hypertension, unspecified: Secondary | ICD-10-CM | POA: Diagnosis not present

## 2021-01-03 DIAGNOSIS — N186 End stage renal disease: Secondary | ICD-10-CM | POA: Diagnosis not present

## 2021-01-09 DIAGNOSIS — R278 Other lack of coordination: Secondary | ICD-10-CM | POA: Diagnosis not present

## 2021-01-09 DIAGNOSIS — R1312 Dysphagia, oropharyngeal phase: Secondary | ICD-10-CM | POA: Diagnosis not present

## 2021-01-09 DIAGNOSIS — Z20822 Contact with and (suspected) exposure to covid-19: Secondary | ICD-10-CM | POA: Diagnosis not present

## 2021-01-09 DIAGNOSIS — J9691 Respiratory failure, unspecified with hypoxia: Secondary | ICD-10-CM | POA: Diagnosis not present

## 2021-01-09 DIAGNOSIS — G928 Other toxic encephalopathy: Secondary | ICD-10-CM | POA: Diagnosis not present

## 2021-01-09 DIAGNOSIS — N390 Urinary tract infection, site not specified: Secondary | ICD-10-CM | POA: Diagnosis not present

## 2021-01-09 DIAGNOSIS — A419 Sepsis, unspecified organism: Secondary | ICD-10-CM | POA: Diagnosis not present

## 2021-01-09 DIAGNOSIS — R269 Unspecified abnormalities of gait and mobility: Secondary | ICD-10-CM | POA: Diagnosis not present

## 2021-01-09 DIAGNOSIS — J90 Pleural effusion, not elsewhere classified: Secondary | ICD-10-CM | POA: Diagnosis not present

## 2021-01-09 DIAGNOSIS — D631 Anemia in chronic kidney disease: Secondary | ICD-10-CM | POA: Diagnosis not present

## 2021-01-09 DIAGNOSIS — R0602 Shortness of breath: Secondary | ICD-10-CM | POA: Diagnosis not present

## 2021-01-09 DIAGNOSIS — J811 Chronic pulmonary edema: Secondary | ICD-10-CM | POA: Diagnosis not present

## 2021-01-09 DIAGNOSIS — R2689 Other abnormalities of gait and mobility: Secondary | ICD-10-CM | POA: Diagnosis not present

## 2021-01-09 DIAGNOSIS — I469 Cardiac arrest, cause unspecified: Secondary | ICD-10-CM | POA: Diagnosis not present

## 2021-01-09 DIAGNOSIS — R4182 Altered mental status, unspecified: Secondary | ICD-10-CM | POA: Diagnosis not present

## 2021-01-09 DIAGNOSIS — I214 Non-ST elevation (NSTEMI) myocardial infarction: Secondary | ICD-10-CM | POA: Diagnosis not present

## 2021-01-09 DIAGNOSIS — J9601 Acute respiratory failure with hypoxia: Secondary | ICD-10-CM | POA: Diagnosis not present

## 2021-01-09 DIAGNOSIS — J9690 Respiratory failure, unspecified, unspecified whether with hypoxia or hypercapnia: Secondary | ICD-10-CM | POA: Diagnosis not present

## 2021-01-09 DIAGNOSIS — I739 Peripheral vascular disease, unspecified: Secondary | ICD-10-CM | POA: Diagnosis not present

## 2021-01-09 DIAGNOSIS — G9340 Encephalopathy, unspecified: Secondary | ICD-10-CM | POA: Diagnosis not present

## 2021-01-09 DIAGNOSIS — I5023 Acute on chronic systolic (congestive) heart failure: Secondary | ICD-10-CM | POA: Diagnosis not present

## 2021-01-09 DIAGNOSIS — E119 Type 2 diabetes mellitus without complications: Secondary | ICD-10-CM | POA: Diagnosis not present

## 2021-01-09 DIAGNOSIS — I255 Ischemic cardiomyopathy: Secondary | ICD-10-CM | POA: Diagnosis not present

## 2021-01-09 DIAGNOSIS — S91301A Unspecified open wound, right foot, initial encounter: Secondary | ICD-10-CM | POA: Diagnosis not present

## 2021-01-09 DIAGNOSIS — L02611 Cutaneous abscess of right foot: Secondary | ICD-10-CM | POA: Diagnosis not present

## 2021-01-09 DIAGNOSIS — J101 Influenza due to other identified influenza virus with other respiratory manifestations: Secondary | ICD-10-CM | POA: Diagnosis not present

## 2021-01-09 DIAGNOSIS — J189 Pneumonia, unspecified organism: Secondary | ICD-10-CM | POA: Diagnosis not present

## 2021-01-09 DIAGNOSIS — M6281 Muscle weakness (generalized): Secondary | ICD-10-CM | POA: Diagnosis not present

## 2021-01-09 DIAGNOSIS — K7200 Acute and subacute hepatic failure without coma: Secondary | ICD-10-CM | POA: Diagnosis not present

## 2021-01-09 DIAGNOSIS — Z515 Encounter for palliative care: Secondary | ICD-10-CM | POA: Diagnosis not present

## 2021-01-09 DIAGNOSIS — I509 Heart failure, unspecified: Secondary | ICD-10-CM | POA: Diagnosis not present

## 2021-01-09 DIAGNOSIS — E118 Type 2 diabetes mellitus with unspecified complications: Secondary | ICD-10-CM | POA: Diagnosis not present

## 2021-01-09 DIAGNOSIS — D649 Anemia, unspecified: Secondary | ICD-10-CM | POA: Diagnosis not present

## 2021-01-09 DIAGNOSIS — N19 Unspecified kidney failure: Secondary | ICD-10-CM | POA: Diagnosis not present

## 2021-01-09 DIAGNOSIS — R293 Abnormal posture: Secondary | ICD-10-CM | POA: Diagnosis not present

## 2021-01-09 DIAGNOSIS — I2510 Atherosclerotic heart disease of native coronary artery without angina pectoris: Secondary | ICD-10-CM | POA: Diagnosis not present

## 2021-01-09 DIAGNOSIS — Z992 Dependence on renal dialysis: Secondary | ICD-10-CM | POA: Diagnosis not present

## 2021-01-09 DIAGNOSIS — E1140 Type 2 diabetes mellitus with diabetic neuropathy, unspecified: Secondary | ICD-10-CM | POA: Diagnosis not present

## 2021-01-09 DIAGNOSIS — G931 Anoxic brain damage, not elsewhere classified: Secondary | ICD-10-CM | POA: Diagnosis not present

## 2021-01-09 DIAGNOSIS — J439 Emphysema, unspecified: Secondary | ICD-10-CM | POA: Diagnosis not present

## 2021-01-09 DIAGNOSIS — E8779 Other fluid overload: Secondary | ICD-10-CM | POA: Diagnosis not present

## 2021-01-09 DIAGNOSIS — E1165 Type 2 diabetes mellitus with hyperglycemia: Secondary | ICD-10-CM | POA: Diagnosis not present

## 2021-01-09 DIAGNOSIS — N186 End stage renal disease: Secondary | ICD-10-CM | POA: Diagnosis not present

## 2021-01-09 DIAGNOSIS — I462 Cardiac arrest due to underlying cardiac condition: Secondary | ICD-10-CM | POA: Diagnosis not present

## 2021-01-09 DIAGNOSIS — E43 Unspecified severe protein-calorie malnutrition: Secondary | ICD-10-CM | POA: Diagnosis not present

## 2021-01-09 DIAGNOSIS — M65071 Abscess of tendon sheath, right ankle and foot: Secondary | ICD-10-CM | POA: Diagnosis not present

## 2021-01-09 DIAGNOSIS — I25119 Atherosclerotic heart disease of native coronary artery with unspecified angina pectoris: Secondary | ICD-10-CM | POA: Diagnosis not present

## 2021-01-09 DIAGNOSIS — Z4682 Encounter for fitting and adjustment of non-vascular catheter: Secondary | ICD-10-CM | POA: Diagnosis not present

## 2021-01-10 DIAGNOSIS — N186 End stage renal disease: Secondary | ICD-10-CM | POA: Diagnosis not present

## 2021-01-10 DIAGNOSIS — E1165 Type 2 diabetes mellitus with hyperglycemia: Secondary | ICD-10-CM | POA: Diagnosis not present

## 2021-01-10 DIAGNOSIS — I255 Ischemic cardiomyopathy: Secondary | ICD-10-CM | POA: Diagnosis not present

## 2021-01-10 DIAGNOSIS — J9691 Respiratory failure, unspecified with hypoxia: Secondary | ICD-10-CM | POA: Diagnosis not present

## 2021-01-10 DIAGNOSIS — E8779 Other fluid overload: Secondary | ICD-10-CM | POA: Diagnosis not present

## 2021-01-10 DIAGNOSIS — D631 Anemia in chronic kidney disease: Secondary | ICD-10-CM | POA: Diagnosis not present

## 2021-01-10 DIAGNOSIS — M65071 Abscess of tendon sheath, right ankle and foot: Secondary | ICD-10-CM | POA: Diagnosis not present

## 2021-01-11 DIAGNOSIS — E8779 Other fluid overload: Secondary | ICD-10-CM | POA: Diagnosis not present

## 2021-01-11 DIAGNOSIS — R293 Abnormal posture: Secondary | ICD-10-CM | POA: Diagnosis not present

## 2021-01-11 DIAGNOSIS — R278 Other lack of coordination: Secondary | ICD-10-CM | POA: Diagnosis not present

## 2021-01-11 DIAGNOSIS — L02611 Cutaneous abscess of right foot: Secondary | ICD-10-CM | POA: Diagnosis not present

## 2021-01-11 DIAGNOSIS — D631 Anemia in chronic kidney disease: Secondary | ICD-10-CM | POA: Diagnosis not present

## 2021-01-11 DIAGNOSIS — I739 Peripheral vascular disease, unspecified: Secondary | ICD-10-CM | POA: Diagnosis not present

## 2021-01-11 DIAGNOSIS — I255 Ischemic cardiomyopathy: Secondary | ICD-10-CM | POA: Diagnosis not present

## 2021-01-11 DIAGNOSIS — E119 Type 2 diabetes mellitus without complications: Secondary | ICD-10-CM | POA: Diagnosis not present

## 2021-01-11 DIAGNOSIS — N186 End stage renal disease: Secondary | ICD-10-CM | POA: Diagnosis not present

## 2021-01-11 DIAGNOSIS — J9691 Respiratory failure, unspecified with hypoxia: Secondary | ICD-10-CM | POA: Diagnosis not present

## 2021-01-11 DIAGNOSIS — E1165 Type 2 diabetes mellitus with hyperglycemia: Secondary | ICD-10-CM | POA: Diagnosis not present

## 2021-01-12 DIAGNOSIS — M6281 Muscle weakness (generalized): Secondary | ICD-10-CM | POA: Diagnosis not present

## 2021-01-12 DIAGNOSIS — D631 Anemia in chronic kidney disease: Secondary | ICD-10-CM | POA: Diagnosis not present

## 2021-01-12 DIAGNOSIS — N186 End stage renal disease: Secondary | ICD-10-CM | POA: Diagnosis not present

## 2021-01-12 DIAGNOSIS — J9691 Respiratory failure, unspecified with hypoxia: Secondary | ICD-10-CM | POA: Diagnosis not present

## 2021-01-12 DIAGNOSIS — E8779 Other fluid overload: Secondary | ICD-10-CM | POA: Diagnosis not present

## 2021-01-12 DIAGNOSIS — E1165 Type 2 diabetes mellitus with hyperglycemia: Secondary | ICD-10-CM | POA: Diagnosis not present

## 2021-01-15 DIAGNOSIS — J9691 Respiratory failure, unspecified with hypoxia: Secondary | ICD-10-CM | POA: Diagnosis not present

## 2021-01-15 DIAGNOSIS — E8779 Other fluid overload: Secondary | ICD-10-CM | POA: Diagnosis not present

## 2021-01-15 DIAGNOSIS — N186 End stage renal disease: Secondary | ICD-10-CM | POA: Diagnosis not present

## 2021-01-15 DIAGNOSIS — I255 Ischemic cardiomyopathy: Secondary | ICD-10-CM | POA: Diagnosis not present

## 2021-01-15 DIAGNOSIS — E1165 Type 2 diabetes mellitus with hyperglycemia: Secondary | ICD-10-CM | POA: Diagnosis not present

## 2021-01-15 DIAGNOSIS — D631 Anemia in chronic kidney disease: Secondary | ICD-10-CM | POA: Diagnosis not present

## 2021-01-16 DIAGNOSIS — J9691 Respiratory failure, unspecified with hypoxia: Secondary | ICD-10-CM | POA: Diagnosis not present

## 2021-01-16 DIAGNOSIS — N186 End stage renal disease: Secondary | ICD-10-CM | POA: Diagnosis not present

## 2021-01-16 DIAGNOSIS — E1140 Type 2 diabetes mellitus with diabetic neuropathy, unspecified: Secondary | ICD-10-CM | POA: Diagnosis not present

## 2021-01-16 DIAGNOSIS — E1165 Type 2 diabetes mellitus with hyperglycemia: Secondary | ICD-10-CM | POA: Diagnosis not present

## 2021-01-16 DIAGNOSIS — D631 Anemia in chronic kidney disease: Secondary | ICD-10-CM | POA: Diagnosis not present

## 2021-01-16 DIAGNOSIS — R278 Other lack of coordination: Secondary | ICD-10-CM | POA: Diagnosis not present

## 2021-01-16 DIAGNOSIS — R2689 Other abnormalities of gait and mobility: Secondary | ICD-10-CM | POA: Diagnosis not present

## 2021-01-16 DIAGNOSIS — I255 Ischemic cardiomyopathy: Secondary | ICD-10-CM | POA: Diagnosis not present

## 2021-01-16 DIAGNOSIS — E8779 Other fluid overload: Secondary | ICD-10-CM | POA: Diagnosis not present

## 2021-01-17 DIAGNOSIS — N186 End stage renal disease: Secondary | ICD-10-CM | POA: Diagnosis not present

## 2021-01-17 DIAGNOSIS — E1165 Type 2 diabetes mellitus with hyperglycemia: Secondary | ICD-10-CM | POA: Diagnosis not present

## 2021-01-17 DIAGNOSIS — D631 Anemia in chronic kidney disease: Secondary | ICD-10-CM | POA: Diagnosis not present

## 2021-01-17 DIAGNOSIS — E8779 Other fluid overload: Secondary | ICD-10-CM | POA: Diagnosis not present

## 2021-01-17 DIAGNOSIS — J9691 Respiratory failure, unspecified with hypoxia: Secondary | ICD-10-CM | POA: Diagnosis not present

## 2021-01-17 DIAGNOSIS — E1140 Type 2 diabetes mellitus with diabetic neuropathy, unspecified: Secondary | ICD-10-CM | POA: Diagnosis not present

## 2021-01-17 DIAGNOSIS — I255 Ischemic cardiomyopathy: Secondary | ICD-10-CM | POA: Diagnosis not present

## 2021-01-18 DIAGNOSIS — E1165 Type 2 diabetes mellitus with hyperglycemia: Secondary | ICD-10-CM | POA: Diagnosis not present

## 2021-01-18 DIAGNOSIS — E1140 Type 2 diabetes mellitus with diabetic neuropathy, unspecified: Secondary | ICD-10-CM | POA: Diagnosis not present

## 2021-01-18 DIAGNOSIS — N186 End stage renal disease: Secondary | ICD-10-CM | POA: Diagnosis not present

## 2021-01-18 DIAGNOSIS — M65071 Abscess of tendon sheath, right ankle and foot: Secondary | ICD-10-CM | POA: Diagnosis not present

## 2021-01-19 DIAGNOSIS — N186 End stage renal disease: Secondary | ICD-10-CM | POA: Diagnosis not present

## 2021-01-19 DIAGNOSIS — R278 Other lack of coordination: Secondary | ICD-10-CM | POA: Diagnosis not present

## 2021-01-19 DIAGNOSIS — R293 Abnormal posture: Secondary | ICD-10-CM | POA: Diagnosis not present

## 2021-01-20 DIAGNOSIS — I509 Heart failure, unspecified: Secondary | ICD-10-CM | POA: Diagnosis not present

## 2021-01-20 DIAGNOSIS — E119 Type 2 diabetes mellitus without complications: Secondary | ICD-10-CM | POA: Diagnosis not present

## 2021-01-20 DIAGNOSIS — R570 Cardiogenic shock: Secondary | ICD-10-CM | POA: Diagnosis not present

## 2021-01-20 DIAGNOSIS — J90 Pleural effusion, not elsewhere classified: Secondary | ICD-10-CM | POA: Diagnosis not present

## 2021-01-20 DIAGNOSIS — D649 Anemia, unspecified: Secondary | ICD-10-CM | POA: Diagnosis not present

## 2021-01-20 DIAGNOSIS — I639 Cerebral infarction, unspecified: Secondary | ICD-10-CM | POA: Diagnosis not present

## 2021-01-20 DIAGNOSIS — J189 Pneumonia, unspecified organism: Secondary | ICD-10-CM | POA: Diagnosis not present

## 2021-01-20 DIAGNOSIS — T68XXXA Hypothermia, initial encounter: Secondary | ICD-10-CM | POA: Diagnosis not present

## 2021-01-20 DIAGNOSIS — R6521 Severe sepsis with septic shock: Secondary | ICD-10-CM | POA: Diagnosis not present

## 2021-01-20 DIAGNOSIS — G931 Anoxic brain damage, not elsewhere classified: Secondary | ICD-10-CM | POA: Diagnosis not present

## 2021-01-20 DIAGNOSIS — Z20822 Contact with and (suspected) exposure to covid-19: Secondary | ICD-10-CM | POA: Diagnosis not present

## 2021-01-20 DIAGNOSIS — J9601 Acute respiratory failure with hypoxia: Secondary | ICD-10-CM | POA: Diagnosis not present

## 2021-01-20 DIAGNOSIS — I5022 Chronic systolic (congestive) heart failure: Secondary | ICD-10-CM | POA: Diagnosis not present

## 2021-01-20 DIAGNOSIS — I2510 Atherosclerotic heart disease of native coronary artery without angina pectoris: Secondary | ICD-10-CM | POA: Diagnosis not present

## 2021-01-20 DIAGNOSIS — R52 Pain, unspecified: Secondary | ICD-10-CM | POA: Diagnosis not present

## 2021-01-20 DIAGNOSIS — J811 Chronic pulmonary edema: Secondary | ICD-10-CM | POA: Diagnosis not present

## 2021-01-20 DIAGNOSIS — J984 Other disorders of lung: Secondary | ICD-10-CM | POA: Diagnosis not present

## 2021-01-20 DIAGNOSIS — J9690 Respiratory failure, unspecified, unspecified whether with hypoxia or hypercapnia: Secondary | ICD-10-CM | POA: Diagnosis not present

## 2021-01-20 DIAGNOSIS — N19 Unspecified kidney failure: Secondary | ICD-10-CM | POA: Diagnosis not present

## 2021-01-20 DIAGNOSIS — I5021 Acute systolic (congestive) heart failure: Secondary | ICD-10-CM | POA: Diagnosis not present

## 2021-01-20 DIAGNOSIS — A419 Sepsis, unspecified organism: Secondary | ICD-10-CM | POA: Diagnosis not present

## 2021-01-20 DIAGNOSIS — K7200 Acute and subacute hepatic failure without coma: Secondary | ICD-10-CM | POA: Diagnosis not present

## 2021-01-20 DIAGNOSIS — I959 Hypotension, unspecified: Secondary | ICD-10-CM | POA: Diagnosis not present

## 2021-01-20 DIAGNOSIS — I5023 Acute on chronic systolic (congestive) heart failure: Secondary | ICD-10-CM | POA: Diagnosis not present

## 2021-01-20 DIAGNOSIS — I132 Hypertensive heart and chronic kidney disease with heart failure and with stage 5 chronic kidney disease, or end stage renal disease: Secondary | ICD-10-CM | POA: Diagnosis not present

## 2021-01-20 DIAGNOSIS — E872 Acidosis: Secondary | ICD-10-CM | POA: Diagnosis not present

## 2021-01-20 DIAGNOSIS — R579 Shock, unspecified: Secondary | ICD-10-CM | POA: Diagnosis not present

## 2021-01-20 DIAGNOSIS — E039 Hypothyroidism, unspecified: Secondary | ICD-10-CM | POA: Diagnosis not present

## 2021-01-20 DIAGNOSIS — N186 End stage renal disease: Secondary | ICD-10-CM | POA: Diagnosis not present

## 2021-01-20 DIAGNOSIS — G928 Other toxic encephalopathy: Secondary | ICD-10-CM | POA: Diagnosis not present

## 2021-01-20 DIAGNOSIS — G9340 Encephalopathy, unspecified: Secondary | ICD-10-CM | POA: Diagnosis not present

## 2021-01-20 DIAGNOSIS — R6520 Severe sepsis without septic shock: Secondary | ICD-10-CM | POA: Diagnosis not present

## 2021-01-20 DIAGNOSIS — I255 Ischemic cardiomyopathy: Secondary | ICD-10-CM | POA: Diagnosis not present

## 2021-01-20 DIAGNOSIS — Z992 Dependence on renal dialysis: Secondary | ICD-10-CM | POA: Diagnosis not present

## 2021-01-20 DIAGNOSIS — D631 Anemia in chronic kidney disease: Secondary | ICD-10-CM | POA: Diagnosis not present

## 2021-01-20 DIAGNOSIS — I25119 Atherosclerotic heart disease of native coronary artery with unspecified angina pectoris: Secondary | ICD-10-CM | POA: Diagnosis not present

## 2021-01-20 DIAGNOSIS — I359 Nonrheumatic aortic valve disorder, unspecified: Secondary | ICD-10-CM | POA: Diagnosis not present

## 2021-01-20 DIAGNOSIS — Z515 Encounter for palliative care: Secondary | ICD-10-CM | POA: Diagnosis not present

## 2021-01-20 DIAGNOSIS — I469 Cardiac arrest, cause unspecified: Secondary | ICD-10-CM | POA: Diagnosis not present

## 2021-01-20 DIAGNOSIS — Z452 Encounter for adjustment and management of vascular access device: Secondary | ICD-10-CM | POA: Diagnosis not present

## 2021-01-20 DIAGNOSIS — R4182 Altered mental status, unspecified: Secondary | ICD-10-CM | POA: Diagnosis not present

## 2021-01-20 DIAGNOSIS — I462 Cardiac arrest due to underlying cardiac condition: Secondary | ICD-10-CM | POA: Diagnosis not present

## 2021-01-20 DIAGNOSIS — Z4682 Encounter for fitting and adjustment of non-vascular catheter: Secondary | ICD-10-CM | POA: Diagnosis not present

## 2021-01-21 DIAGNOSIS — E119 Type 2 diabetes mellitus without complications: Secondary | ICD-10-CM | POA: Diagnosis not present

## 2021-01-21 DIAGNOSIS — N186 End stage renal disease: Secondary | ICD-10-CM | POA: Diagnosis not present

## 2021-01-21 DIAGNOSIS — D649 Anemia, unspecified: Secondary | ICD-10-CM | POA: Diagnosis not present

## 2021-01-21 DIAGNOSIS — J9690 Respiratory failure, unspecified, unspecified whether with hypoxia or hypercapnia: Secondary | ICD-10-CM | POA: Diagnosis not present

## 2021-01-21 DIAGNOSIS — G928 Other toxic encephalopathy: Secondary | ICD-10-CM | POA: Diagnosis not present

## 2021-01-21 DIAGNOSIS — I462 Cardiac arrest due to underlying cardiac condition: Secondary | ICD-10-CM | POA: Diagnosis not present

## 2021-01-21 DIAGNOSIS — I5023 Acute on chronic systolic (congestive) heart failure: Secondary | ICD-10-CM | POA: Diagnosis not present

## 2021-01-21 DIAGNOSIS — G931 Anoxic brain damage, not elsewhere classified: Secondary | ICD-10-CM | POA: Diagnosis not present

## 2021-01-21 DIAGNOSIS — T68XXXA Hypothermia, initial encounter: Secondary | ICD-10-CM | POA: Diagnosis not present

## 2021-01-21 DIAGNOSIS — I5021 Acute systolic (congestive) heart failure: Secondary | ICD-10-CM | POA: Diagnosis not present

## 2021-01-21 DIAGNOSIS — I2510 Atherosclerotic heart disease of native coronary artery without angina pectoris: Secondary | ICD-10-CM | POA: Diagnosis not present

## 2021-01-21 DIAGNOSIS — R6520 Severe sepsis without septic shock: Secondary | ICD-10-CM | POA: Diagnosis not present

## 2021-01-21 DIAGNOSIS — I469 Cardiac arrest, cause unspecified: Secondary | ICD-10-CM | POA: Diagnosis not present

## 2021-01-21 DIAGNOSIS — Z515 Encounter for palliative care: Secondary | ICD-10-CM | POA: Diagnosis not present

## 2021-01-21 DIAGNOSIS — Z20822 Contact with and (suspected) exposure to covid-19: Secondary | ICD-10-CM | POA: Diagnosis not present

## 2021-01-21 DIAGNOSIS — I132 Hypertensive heart and chronic kidney disease with heart failure and with stage 5 chronic kidney disease, or end stage renal disease: Secondary | ICD-10-CM | POA: Diagnosis not present

## 2021-01-21 DIAGNOSIS — I359 Nonrheumatic aortic valve disorder, unspecified: Secondary | ICD-10-CM | POA: Diagnosis not present

## 2021-01-21 DIAGNOSIS — I25119 Atherosclerotic heart disease of native coronary artery with unspecified angina pectoris: Secondary | ICD-10-CM | POA: Diagnosis not present

## 2021-01-22 DIAGNOSIS — N186 End stage renal disease: Secondary | ICD-10-CM | POA: Diagnosis not present

## 2021-01-22 DIAGNOSIS — T68XXXA Hypothermia, initial encounter: Secondary | ICD-10-CM | POA: Diagnosis not present

## 2021-01-22 DIAGNOSIS — I5021 Acute systolic (congestive) heart failure: Secondary | ICD-10-CM | POA: Diagnosis not present

## 2021-01-22 DIAGNOSIS — I462 Cardiac arrest due to underlying cardiac condition: Secondary | ICD-10-CM | POA: Diagnosis not present

## 2021-01-22 DIAGNOSIS — I469 Cardiac arrest, cause unspecified: Secondary | ICD-10-CM | POA: Diagnosis not present

## 2021-01-22 DIAGNOSIS — Z515 Encounter for palliative care: Secondary | ICD-10-CM | POA: Diagnosis not present

## 2021-01-22 DIAGNOSIS — I359 Nonrheumatic aortic valve disorder, unspecified: Secondary | ICD-10-CM | POA: Diagnosis not present

## 2021-01-22 DIAGNOSIS — R579 Shock, unspecified: Secondary | ICD-10-CM | POA: Diagnosis not present

## 2021-01-22 DIAGNOSIS — D649 Anemia, unspecified: Secondary | ICD-10-CM | POA: Diagnosis not present

## 2021-01-22 DIAGNOSIS — I25119 Atherosclerotic heart disease of native coronary artery with unspecified angina pectoris: Secondary | ICD-10-CM | POA: Diagnosis not present

## 2021-01-22 DIAGNOSIS — J9690 Respiratory failure, unspecified, unspecified whether with hypoxia or hypercapnia: Secondary | ICD-10-CM | POA: Diagnosis not present

## 2021-01-22 DIAGNOSIS — Z992 Dependence on renal dialysis: Secondary | ICD-10-CM | POA: Diagnosis not present

## 2021-01-22 DIAGNOSIS — Z452 Encounter for adjustment and management of vascular access device: Secondary | ICD-10-CM | POA: Diagnosis not present

## 2021-01-22 DIAGNOSIS — D631 Anemia in chronic kidney disease: Secondary | ICD-10-CM | POA: Diagnosis not present

## 2021-01-22 DIAGNOSIS — E119 Type 2 diabetes mellitus without complications: Secondary | ICD-10-CM | POA: Diagnosis not present

## 2021-01-23 DIAGNOSIS — I469 Cardiac arrest, cause unspecified: Secondary | ICD-10-CM | POA: Diagnosis not present

## 2021-01-23 DIAGNOSIS — I5021 Acute systolic (congestive) heart failure: Secondary | ICD-10-CM | POA: Diagnosis not present

## 2021-01-23 DIAGNOSIS — Z515 Encounter for palliative care: Secondary | ICD-10-CM | POA: Diagnosis not present

## 2021-01-23 DIAGNOSIS — I462 Cardiac arrest due to underlying cardiac condition: Secondary | ICD-10-CM | POA: Diagnosis not present

## 2021-01-23 DIAGNOSIS — I25119 Atherosclerotic heart disease of native coronary artery with unspecified angina pectoris: Secondary | ICD-10-CM | POA: Diagnosis not present

## 2021-01-23 DIAGNOSIS — J9690 Respiratory failure, unspecified, unspecified whether with hypoxia or hypercapnia: Secondary | ICD-10-CM | POA: Diagnosis not present

## 2021-01-23 DIAGNOSIS — D649 Anemia, unspecified: Secondary | ICD-10-CM | POA: Diagnosis not present

## 2021-01-23 DIAGNOSIS — J984 Other disorders of lung: Secondary | ICD-10-CM | POA: Diagnosis not present

## 2021-01-23 DIAGNOSIS — N186 End stage renal disease: Secondary | ICD-10-CM | POA: Diagnosis not present

## 2021-01-23 DIAGNOSIS — D631 Anemia in chronic kidney disease: Secondary | ICD-10-CM | POA: Diagnosis not present

## 2021-01-23 DIAGNOSIS — R4182 Altered mental status, unspecified: Secondary | ICD-10-CM | POA: Diagnosis not present

## 2021-01-23 DIAGNOSIS — I359 Nonrheumatic aortic valve disorder, unspecified: Secondary | ICD-10-CM | POA: Diagnosis not present

## 2021-01-23 DIAGNOSIS — T68XXXA Hypothermia, initial encounter: Secondary | ICD-10-CM | POA: Diagnosis not present

## 2021-01-23 DIAGNOSIS — Z992 Dependence on renal dialysis: Secondary | ICD-10-CM | POA: Diagnosis not present

## 2021-01-23 DIAGNOSIS — R579 Shock, unspecified: Secondary | ICD-10-CM | POA: Diagnosis not present

## 2021-01-23 DIAGNOSIS — G931 Anoxic brain damage, not elsewhere classified: Secondary | ICD-10-CM | POA: Diagnosis not present

## 2021-01-23 DIAGNOSIS — I639 Cerebral infarction, unspecified: Secondary | ICD-10-CM | POA: Diagnosis not present

## 2021-01-23 DIAGNOSIS — N19 Unspecified kidney failure: Secondary | ICD-10-CM | POA: Diagnosis not present

## 2021-01-23 DIAGNOSIS — E119 Type 2 diabetes mellitus without complications: Secondary | ICD-10-CM | POA: Diagnosis not present

## 2021-01-23 DIAGNOSIS — E872 Acidosis: Secondary | ICD-10-CM | POA: Diagnosis not present

## 2021-01-24 DIAGNOSIS — N19 Unspecified kidney failure: Secondary | ICD-10-CM | POA: Diagnosis not present

## 2021-01-24 DIAGNOSIS — I469 Cardiac arrest, cause unspecified: Secondary | ICD-10-CM | POA: Diagnosis not present

## 2021-01-24 DIAGNOSIS — I5021 Acute systolic (congestive) heart failure: Secondary | ICD-10-CM | POA: Diagnosis not present

## 2021-01-24 DIAGNOSIS — E872 Acidosis: Secondary | ICD-10-CM | POA: Diagnosis not present

## 2021-01-24 DIAGNOSIS — I359 Nonrheumatic aortic valve disorder, unspecified: Secondary | ICD-10-CM | POA: Diagnosis not present

## 2021-01-24 DIAGNOSIS — I25119 Atherosclerotic heart disease of native coronary artery with unspecified angina pectoris: Secondary | ICD-10-CM | POA: Diagnosis not present

## 2021-01-24 DIAGNOSIS — E119 Type 2 diabetes mellitus without complications: Secondary | ICD-10-CM | POA: Diagnosis not present

## 2021-01-24 DIAGNOSIS — I462 Cardiac arrest due to underlying cardiac condition: Secondary | ICD-10-CM | POA: Diagnosis not present

## 2021-01-24 DIAGNOSIS — Z515 Encounter for palliative care: Secondary | ICD-10-CM | POA: Diagnosis not present

## 2021-01-24 DIAGNOSIS — J9690 Respiratory failure, unspecified, unspecified whether with hypoxia or hypercapnia: Secondary | ICD-10-CM | POA: Diagnosis not present

## 2021-01-24 DIAGNOSIS — G931 Anoxic brain damage, not elsewhere classified: Secondary | ICD-10-CM | POA: Diagnosis not present

## 2021-01-24 DIAGNOSIS — D631 Anemia in chronic kidney disease: Secondary | ICD-10-CM | POA: Diagnosis not present

## 2021-01-24 DIAGNOSIS — N186 End stage renal disease: Secondary | ICD-10-CM | POA: Diagnosis not present

## 2021-01-24 DIAGNOSIS — Z4682 Encounter for fitting and adjustment of non-vascular catheter: Secondary | ICD-10-CM | POA: Diagnosis not present

## 2021-01-24 DIAGNOSIS — D649 Anemia, unspecified: Secondary | ICD-10-CM | POA: Diagnosis not present

## 2021-01-24 DIAGNOSIS — Z992 Dependence on renal dialysis: Secondary | ICD-10-CM | POA: Diagnosis not present

## 2021-01-24 DIAGNOSIS — R570 Cardiogenic shock: Secondary | ICD-10-CM | POA: Diagnosis not present

## 2021-01-25 DIAGNOSIS — R570 Cardiogenic shock: Secondary | ICD-10-CM | POA: Diagnosis not present

## 2021-01-25 DIAGNOSIS — N19 Unspecified kidney failure: Secondary | ICD-10-CM | POA: Diagnosis not present

## 2021-01-25 DIAGNOSIS — Z515 Encounter for palliative care: Secondary | ICD-10-CM | POA: Diagnosis not present

## 2021-01-25 DIAGNOSIS — I5021 Acute systolic (congestive) heart failure: Secondary | ICD-10-CM | POA: Diagnosis not present

## 2021-01-25 DIAGNOSIS — E872 Acidosis: Secondary | ICD-10-CM | POA: Diagnosis not present

## 2021-01-25 DIAGNOSIS — I462 Cardiac arrest due to underlying cardiac condition: Secondary | ICD-10-CM | POA: Diagnosis not present

## 2021-01-25 DIAGNOSIS — E119 Type 2 diabetes mellitus without complications: Secondary | ICD-10-CM | POA: Diagnosis not present

## 2021-01-25 DIAGNOSIS — I25119 Atherosclerotic heart disease of native coronary artery with unspecified angina pectoris: Secondary | ICD-10-CM | POA: Diagnosis not present

## 2021-01-25 DIAGNOSIS — D649 Anemia, unspecified: Secondary | ICD-10-CM | POA: Diagnosis not present

## 2021-01-25 DIAGNOSIS — I359 Nonrheumatic aortic valve disorder, unspecified: Secondary | ICD-10-CM | POA: Diagnosis not present

## 2021-01-25 DIAGNOSIS — Z992 Dependence on renal dialysis: Secondary | ICD-10-CM | POA: Diagnosis not present

## 2021-01-25 DIAGNOSIS — J9690 Respiratory failure, unspecified, unspecified whether with hypoxia or hypercapnia: Secondary | ICD-10-CM | POA: Diagnosis not present

## 2021-01-25 DIAGNOSIS — N186 End stage renal disease: Secondary | ICD-10-CM | POA: Diagnosis not present

## 2021-01-25 DIAGNOSIS — G931 Anoxic brain damage, not elsewhere classified: Secondary | ICD-10-CM | POA: Diagnosis not present

## 2021-01-25 DIAGNOSIS — I469 Cardiac arrest, cause unspecified: Secondary | ICD-10-CM | POA: Diagnosis not present

## 2021-01-25 DIAGNOSIS — D631 Anemia in chronic kidney disease: Secondary | ICD-10-CM | POA: Diagnosis not present

## 2021-01-26 DIAGNOSIS — I5021 Acute systolic (congestive) heart failure: Secondary | ICD-10-CM | POA: Diagnosis not present

## 2021-01-26 DIAGNOSIS — G931 Anoxic brain damage, not elsewhere classified: Secondary | ICD-10-CM | POA: Diagnosis not present

## 2021-01-26 DIAGNOSIS — Z992 Dependence on renal dialysis: Secondary | ICD-10-CM | POA: Diagnosis not present

## 2021-01-26 DIAGNOSIS — D649 Anemia, unspecified: Secondary | ICD-10-CM | POA: Diagnosis not present

## 2021-01-26 DIAGNOSIS — N186 End stage renal disease: Secondary | ICD-10-CM | POA: Diagnosis not present

## 2021-01-26 DIAGNOSIS — Z515 Encounter for palliative care: Secondary | ICD-10-CM | POA: Diagnosis not present

## 2021-01-26 DIAGNOSIS — J9690 Respiratory failure, unspecified, unspecified whether with hypoxia or hypercapnia: Secondary | ICD-10-CM | POA: Diagnosis not present

## 2021-01-26 DIAGNOSIS — I359 Nonrheumatic aortic valve disorder, unspecified: Secondary | ICD-10-CM | POA: Diagnosis not present

## 2021-01-26 DIAGNOSIS — D631 Anemia in chronic kidney disease: Secondary | ICD-10-CM | POA: Diagnosis not present

## 2021-01-26 DIAGNOSIS — I462 Cardiac arrest due to underlying cardiac condition: Secondary | ICD-10-CM | POA: Diagnosis not present

## 2021-01-26 DIAGNOSIS — E119 Type 2 diabetes mellitus without complications: Secondary | ICD-10-CM | POA: Diagnosis not present

## 2021-01-26 DIAGNOSIS — E872 Acidosis: Secondary | ICD-10-CM | POA: Diagnosis not present

## 2021-01-26 DIAGNOSIS — I25119 Atherosclerotic heart disease of native coronary artery with unspecified angina pectoris: Secondary | ICD-10-CM | POA: Diagnosis not present

## 2021-01-26 DIAGNOSIS — R570 Cardiogenic shock: Secondary | ICD-10-CM | POA: Diagnosis not present

## 2021-01-26 DIAGNOSIS — N19 Unspecified kidney failure: Secondary | ICD-10-CM | POA: Diagnosis not present

## 2021-01-26 DIAGNOSIS — I469 Cardiac arrest, cause unspecified: Secondary | ICD-10-CM | POA: Diagnosis not present

## 2021-01-27 DIAGNOSIS — N19 Unspecified kidney failure: Secondary | ICD-10-CM | POA: Diagnosis not present

## 2021-01-27 DIAGNOSIS — R570 Cardiogenic shock: Secondary | ICD-10-CM | POA: Diagnosis not present

## 2021-01-27 DIAGNOSIS — I462 Cardiac arrest due to underlying cardiac condition: Secondary | ICD-10-CM | POA: Diagnosis not present

## 2021-01-27 DIAGNOSIS — E872 Acidosis: Secondary | ICD-10-CM | POA: Diagnosis not present

## 2021-01-27 DIAGNOSIS — D631 Anemia in chronic kidney disease: Secondary | ICD-10-CM | POA: Diagnosis not present

## 2021-01-27 DIAGNOSIS — E119 Type 2 diabetes mellitus without complications: Secondary | ICD-10-CM | POA: Diagnosis not present

## 2021-01-27 DIAGNOSIS — I5021 Acute systolic (congestive) heart failure: Secondary | ICD-10-CM | POA: Diagnosis not present

## 2021-01-27 DIAGNOSIS — N186 End stage renal disease: Secondary | ICD-10-CM | POA: Diagnosis not present

## 2021-01-27 DIAGNOSIS — G931 Anoxic brain damage, not elsewhere classified: Secondary | ICD-10-CM | POA: Diagnosis not present

## 2021-01-27 DIAGNOSIS — I469 Cardiac arrest, cause unspecified: Secondary | ICD-10-CM | POA: Diagnosis not present

## 2021-01-27 DIAGNOSIS — I25119 Atherosclerotic heart disease of native coronary artery with unspecified angina pectoris: Secondary | ICD-10-CM | POA: Diagnosis not present

## 2021-01-27 DIAGNOSIS — Z992 Dependence on renal dialysis: Secondary | ICD-10-CM | POA: Diagnosis not present

## 2021-01-27 DIAGNOSIS — D649 Anemia, unspecified: Secondary | ICD-10-CM | POA: Diagnosis not present

## 2021-01-27 DIAGNOSIS — Z515 Encounter for palliative care: Secondary | ICD-10-CM | POA: Diagnosis not present

## 2021-01-28 DIAGNOSIS — N19 Unspecified kidney failure: Secondary | ICD-10-CM | POA: Diagnosis not present

## 2021-01-28 DIAGNOSIS — I2510 Atherosclerotic heart disease of native coronary artery without angina pectoris: Secondary | ICD-10-CM | POA: Diagnosis not present

## 2021-01-28 DIAGNOSIS — Z992 Dependence on renal dialysis: Secondary | ICD-10-CM | POA: Diagnosis not present

## 2021-01-28 DIAGNOSIS — R570 Cardiogenic shock: Secondary | ICD-10-CM | POA: Diagnosis not present

## 2021-01-28 DIAGNOSIS — E872 Acidosis: Secondary | ICD-10-CM | POA: Diagnosis not present

## 2021-01-28 DIAGNOSIS — I469 Cardiac arrest, cause unspecified: Secondary | ICD-10-CM | POA: Diagnosis not present

## 2021-01-28 DIAGNOSIS — D649 Anemia, unspecified: Secondary | ICD-10-CM | POA: Diagnosis not present

## 2021-01-28 DIAGNOSIS — I462 Cardiac arrest due to underlying cardiac condition: Secondary | ICD-10-CM | POA: Diagnosis not present

## 2021-01-28 DIAGNOSIS — I5022 Chronic systolic (congestive) heart failure: Secondary | ICD-10-CM | POA: Diagnosis not present

## 2021-01-28 DIAGNOSIS — N186 End stage renal disease: Secondary | ICD-10-CM | POA: Diagnosis not present

## 2021-01-28 DIAGNOSIS — I5021 Acute systolic (congestive) heart failure: Secondary | ICD-10-CM | POA: Diagnosis not present

## 2021-01-28 DIAGNOSIS — D631 Anemia in chronic kidney disease: Secondary | ICD-10-CM | POA: Diagnosis not present

## 2021-01-28 DIAGNOSIS — I25119 Atherosclerotic heart disease of native coronary artery with unspecified angina pectoris: Secondary | ICD-10-CM | POA: Diagnosis not present

## 2021-01-28 DIAGNOSIS — J9690 Respiratory failure, unspecified, unspecified whether with hypoxia or hypercapnia: Secondary | ICD-10-CM | POA: Diagnosis not present

## 2021-01-28 DIAGNOSIS — E119 Type 2 diabetes mellitus without complications: Secondary | ICD-10-CM | POA: Diagnosis not present

## 2021-01-28 DIAGNOSIS — G931 Anoxic brain damage, not elsewhere classified: Secondary | ICD-10-CM | POA: Diagnosis not present

## 2021-01-28 DIAGNOSIS — Z515 Encounter for palliative care: Secondary | ICD-10-CM | POA: Diagnosis not present

## 2021-01-29 DIAGNOSIS — I25119 Atherosclerotic heart disease of native coronary artery with unspecified angina pectoris: Secondary | ICD-10-CM | POA: Diagnosis not present

## 2021-01-29 DIAGNOSIS — Z992 Dependence on renal dialysis: Secondary | ICD-10-CM | POA: Diagnosis not present

## 2021-01-29 DIAGNOSIS — I2510 Atherosclerotic heart disease of native coronary artery without angina pectoris: Secondary | ICD-10-CM | POA: Diagnosis not present

## 2021-01-29 DIAGNOSIS — D649 Anemia, unspecified: Secondary | ICD-10-CM | POA: Diagnosis not present

## 2021-01-29 DIAGNOSIS — I5022 Chronic systolic (congestive) heart failure: Secondary | ICD-10-CM | POA: Diagnosis not present

## 2021-01-29 DIAGNOSIS — E872 Acidosis: Secondary | ICD-10-CM | POA: Diagnosis not present

## 2021-01-29 DIAGNOSIS — G931 Anoxic brain damage, not elsewhere classified: Secondary | ICD-10-CM | POA: Diagnosis not present

## 2021-01-29 DIAGNOSIS — N19 Unspecified kidney failure: Secondary | ICD-10-CM | POA: Diagnosis not present

## 2021-01-29 DIAGNOSIS — I5021 Acute systolic (congestive) heart failure: Secondary | ICD-10-CM | POA: Diagnosis not present

## 2021-01-29 DIAGNOSIS — E119 Type 2 diabetes mellitus without complications: Secondary | ICD-10-CM | POA: Diagnosis not present

## 2021-01-29 DIAGNOSIS — N186 End stage renal disease: Secondary | ICD-10-CM | POA: Diagnosis not present

## 2021-01-29 DIAGNOSIS — I462 Cardiac arrest due to underlying cardiac condition: Secondary | ICD-10-CM | POA: Diagnosis not present

## 2021-01-29 DIAGNOSIS — R570 Cardiogenic shock: Secondary | ICD-10-CM | POA: Diagnosis not present

## 2021-01-29 DIAGNOSIS — D631 Anemia in chronic kidney disease: Secondary | ICD-10-CM | POA: Diagnosis not present

## 2021-01-29 DIAGNOSIS — Z515 Encounter for palliative care: Secondary | ICD-10-CM | POA: Diagnosis not present

## 2021-01-29 DIAGNOSIS — I469 Cardiac arrest, cause unspecified: Secondary | ICD-10-CM | POA: Diagnosis not present

## 2021-01-29 DIAGNOSIS — J9690 Respiratory failure, unspecified, unspecified whether with hypoxia or hypercapnia: Secondary | ICD-10-CM | POA: Diagnosis not present

## 2021-01-30 DIAGNOSIS — D649 Anemia, unspecified: Secondary | ICD-10-CM | POA: Diagnosis not present

## 2021-01-30 DIAGNOSIS — D631 Anemia in chronic kidney disease: Secondary | ICD-10-CM | POA: Diagnosis not present

## 2021-01-30 DIAGNOSIS — N186 End stage renal disease: Secondary | ICD-10-CM | POA: Diagnosis not present

## 2021-01-30 DIAGNOSIS — I5022 Chronic systolic (congestive) heart failure: Secondary | ICD-10-CM | POA: Diagnosis not present

## 2021-01-30 DIAGNOSIS — R570 Cardiogenic shock: Secondary | ICD-10-CM | POA: Diagnosis not present

## 2021-01-30 DIAGNOSIS — I462 Cardiac arrest due to underlying cardiac condition: Secondary | ICD-10-CM | POA: Diagnosis not present

## 2021-01-30 DIAGNOSIS — Z992 Dependence on renal dialysis: Secondary | ICD-10-CM | POA: Diagnosis not present

## 2021-01-30 DIAGNOSIS — E119 Type 2 diabetes mellitus without complications: Secondary | ICD-10-CM | POA: Diagnosis not present

## 2021-01-30 DIAGNOSIS — N19 Unspecified kidney failure: Secondary | ICD-10-CM | POA: Diagnosis not present

## 2021-01-30 DIAGNOSIS — I469 Cardiac arrest, cause unspecified: Secondary | ICD-10-CM | POA: Diagnosis not present

## 2021-01-30 DIAGNOSIS — G931 Anoxic brain damage, not elsewhere classified: Secondary | ICD-10-CM | POA: Diagnosis not present

## 2021-01-30 DIAGNOSIS — E872 Acidosis: Secondary | ICD-10-CM | POA: Diagnosis not present

## 2021-01-30 DIAGNOSIS — I2510 Atherosclerotic heart disease of native coronary artery without angina pectoris: Secondary | ICD-10-CM | POA: Diagnosis not present

## 2021-01-30 DIAGNOSIS — I5021 Acute systolic (congestive) heart failure: Secondary | ICD-10-CM | POA: Diagnosis not present

## 2021-01-30 DIAGNOSIS — I25119 Atherosclerotic heart disease of native coronary artery with unspecified angina pectoris: Secondary | ICD-10-CM | POA: Diagnosis not present

## 2021-01-31 DIAGNOSIS — N19 Unspecified kidney failure: Secondary | ICD-10-CM | POA: Diagnosis not present

## 2021-01-31 DIAGNOSIS — I25119 Atherosclerotic heart disease of native coronary artery with unspecified angina pectoris: Secondary | ICD-10-CM | POA: Diagnosis not present

## 2021-01-31 DIAGNOSIS — N186 End stage renal disease: Secondary | ICD-10-CM | POA: Diagnosis not present

## 2021-01-31 DIAGNOSIS — E119 Type 2 diabetes mellitus without complications: Secondary | ICD-10-CM | POA: Diagnosis not present

## 2021-01-31 DIAGNOSIS — R570 Cardiogenic shock: Secondary | ICD-10-CM | POA: Diagnosis not present

## 2021-01-31 DIAGNOSIS — J9690 Respiratory failure, unspecified, unspecified whether with hypoxia or hypercapnia: Secondary | ICD-10-CM | POA: Diagnosis not present

## 2021-01-31 DIAGNOSIS — D631 Anemia in chronic kidney disease: Secondary | ICD-10-CM | POA: Diagnosis not present

## 2021-01-31 DIAGNOSIS — Z515 Encounter for palliative care: Secondary | ICD-10-CM | POA: Diagnosis not present

## 2021-01-31 DIAGNOSIS — I469 Cardiac arrest, cause unspecified: Secondary | ICD-10-CM | POA: Diagnosis not present

## 2021-01-31 DIAGNOSIS — E872 Acidosis: Secondary | ICD-10-CM | POA: Diagnosis not present

## 2021-01-31 DIAGNOSIS — I2510 Atherosclerotic heart disease of native coronary artery without angina pectoris: Secondary | ICD-10-CM | POA: Diagnosis not present

## 2021-01-31 DIAGNOSIS — D649 Anemia, unspecified: Secondary | ICD-10-CM | POA: Diagnosis not present

## 2021-01-31 DIAGNOSIS — I5021 Acute systolic (congestive) heart failure: Secondary | ICD-10-CM | POA: Diagnosis not present

## 2021-01-31 DIAGNOSIS — Z992 Dependence on renal dialysis: Secondary | ICD-10-CM | POA: Diagnosis not present

## 2021-01-31 DIAGNOSIS — I5022 Chronic systolic (congestive) heart failure: Secondary | ICD-10-CM | POA: Diagnosis not present

## 2021-01-31 DIAGNOSIS — I462 Cardiac arrest due to underlying cardiac condition: Secondary | ICD-10-CM | POA: Diagnosis not present

## 2021-01-31 DIAGNOSIS — G931 Anoxic brain damage, not elsewhere classified: Secondary | ICD-10-CM | POA: Diagnosis not present

## 2021-02-01 DIAGNOSIS — Z515 Encounter for palliative care: Secondary | ICD-10-CM | POA: Diagnosis not present

## 2021-02-01 DIAGNOSIS — I25119 Atherosclerotic heart disease of native coronary artery with unspecified angina pectoris: Secondary | ICD-10-CM | POA: Diagnosis not present

## 2021-02-01 DIAGNOSIS — I5021 Acute systolic (congestive) heart failure: Secondary | ICD-10-CM | POA: Diagnosis not present

## 2021-02-01 DIAGNOSIS — N186 End stage renal disease: Secondary | ICD-10-CM | POA: Diagnosis not present

## 2021-02-01 DIAGNOSIS — I469 Cardiac arrest, cause unspecified: Secondary | ICD-10-CM | POA: Diagnosis not present

## 2021-02-01 DIAGNOSIS — I462 Cardiac arrest due to underlying cardiac condition: Secondary | ICD-10-CM | POA: Diagnosis not present

## 2021-02-01 DIAGNOSIS — E872 Acidosis: Secondary | ICD-10-CM | POA: Diagnosis not present

## 2021-02-01 DIAGNOSIS — Z992 Dependence on renal dialysis: Secondary | ICD-10-CM | POA: Diagnosis not present

## 2021-02-01 DIAGNOSIS — D649 Anemia, unspecified: Secondary | ICD-10-CM | POA: Diagnosis not present

## 2021-02-01 DIAGNOSIS — E119 Type 2 diabetes mellitus without complications: Secondary | ICD-10-CM | POA: Diagnosis not present

## 2021-02-01 DIAGNOSIS — R570 Cardiogenic shock: Secondary | ICD-10-CM | POA: Diagnosis not present

## 2021-02-01 DIAGNOSIS — J9690 Respiratory failure, unspecified, unspecified whether with hypoxia or hypercapnia: Secondary | ICD-10-CM | POA: Diagnosis not present

## 2021-02-01 DIAGNOSIS — D631 Anemia in chronic kidney disease: Secondary | ICD-10-CM | POA: Diagnosis not present

## 2021-02-01 DIAGNOSIS — R4182 Altered mental status, unspecified: Secondary | ICD-10-CM | POA: Diagnosis not present

## 2021-02-01 DIAGNOSIS — N19 Unspecified kidney failure: Secondary | ICD-10-CM | POA: Diagnosis not present

## 2021-02-01 DIAGNOSIS — G931 Anoxic brain damage, not elsewhere classified: Secondary | ICD-10-CM | POA: Diagnosis not present

## 2021-02-01 DIAGNOSIS — I2510 Atherosclerotic heart disease of native coronary artery without angina pectoris: Secondary | ICD-10-CM | POA: Diagnosis not present

## 2021-02-01 DIAGNOSIS — I959 Hypotension, unspecified: Secondary | ICD-10-CM | POA: Diagnosis not present

## 2021-02-02 DIAGNOSIS — E872 Acidosis: Secondary | ICD-10-CM | POA: Diagnosis not present

## 2021-02-02 DIAGNOSIS — I462 Cardiac arrest due to underlying cardiac condition: Secondary | ICD-10-CM | POA: Diagnosis not present

## 2021-02-02 DIAGNOSIS — R4182 Altered mental status, unspecified: Secondary | ICD-10-CM | POA: Diagnosis not present

## 2021-02-02 DIAGNOSIS — I2510 Atherosclerotic heart disease of native coronary artery without angina pectoris: Secondary | ICD-10-CM | POA: Diagnosis not present

## 2021-02-02 DIAGNOSIS — I959 Hypotension, unspecified: Secondary | ICD-10-CM | POA: Diagnosis not present

## 2021-02-02 DIAGNOSIS — I5021 Acute systolic (congestive) heart failure: Secondary | ICD-10-CM | POA: Diagnosis not present

## 2021-02-02 DIAGNOSIS — E119 Type 2 diabetes mellitus without complications: Secondary | ICD-10-CM | POA: Diagnosis not present

## 2021-02-02 DIAGNOSIS — Z992 Dependence on renal dialysis: Secondary | ICD-10-CM | POA: Diagnosis not present

## 2021-02-02 DIAGNOSIS — N186 End stage renal disease: Secondary | ICD-10-CM | POA: Diagnosis not present

## 2021-02-02 DIAGNOSIS — G931 Anoxic brain damage, not elsewhere classified: Secondary | ICD-10-CM | POA: Diagnosis not present

## 2021-02-02 DIAGNOSIS — D649 Anemia, unspecified: Secondary | ICD-10-CM | POA: Diagnosis not present

## 2021-02-02 DIAGNOSIS — I469 Cardiac arrest, cause unspecified: Secondary | ICD-10-CM | POA: Diagnosis not present

## 2021-02-02 DIAGNOSIS — R570 Cardiogenic shock: Secondary | ICD-10-CM | POA: Diagnosis not present

## 2021-02-02 DIAGNOSIS — D631 Anemia in chronic kidney disease: Secondary | ICD-10-CM | POA: Diagnosis not present

## 2021-02-02 DIAGNOSIS — I25119 Atherosclerotic heart disease of native coronary artery with unspecified angina pectoris: Secondary | ICD-10-CM | POA: Diagnosis not present

## 2021-02-02 DIAGNOSIS — N19 Unspecified kidney failure: Secondary | ICD-10-CM | POA: Diagnosis not present

## 2021-02-03 DIAGNOSIS — R4182 Altered mental status, unspecified: Secondary | ICD-10-CM | POA: Diagnosis not present

## 2021-02-03 DIAGNOSIS — I462 Cardiac arrest due to underlying cardiac condition: Secondary | ICD-10-CM | POA: Diagnosis not present

## 2021-02-03 DIAGNOSIS — I959 Hypotension, unspecified: Secondary | ICD-10-CM | POA: Diagnosis not present

## 2021-02-03 DIAGNOSIS — E872 Acidosis: Secondary | ICD-10-CM | POA: Diagnosis not present

## 2021-02-03 DIAGNOSIS — I469 Cardiac arrest, cause unspecified: Secondary | ICD-10-CM | POA: Diagnosis not present

## 2021-02-03 DIAGNOSIS — N19 Unspecified kidney failure: Secondary | ICD-10-CM | POA: Diagnosis not present

## 2021-02-03 DIAGNOSIS — I2510 Atherosclerotic heart disease of native coronary artery without angina pectoris: Secondary | ICD-10-CM | POA: Diagnosis not present

## 2021-02-03 DIAGNOSIS — D649 Anemia, unspecified: Secondary | ICD-10-CM | POA: Diagnosis not present

## 2021-02-03 DIAGNOSIS — Z992 Dependence on renal dialysis: Secondary | ICD-10-CM | POA: Diagnosis not present

## 2021-02-03 DIAGNOSIS — I5021 Acute systolic (congestive) heart failure: Secondary | ICD-10-CM | POA: Diagnosis not present

## 2021-02-03 DIAGNOSIS — G931 Anoxic brain damage, not elsewhere classified: Secondary | ICD-10-CM | POA: Diagnosis not present

## 2021-02-03 DIAGNOSIS — R570 Cardiogenic shock: Secondary | ICD-10-CM | POA: Diagnosis not present

## 2021-02-03 DIAGNOSIS — I25119 Atherosclerotic heart disease of native coronary artery with unspecified angina pectoris: Secondary | ICD-10-CM | POA: Diagnosis not present

## 2021-02-03 DIAGNOSIS — N186 End stage renal disease: Secondary | ICD-10-CM | POA: Diagnosis not present

## 2021-02-03 DIAGNOSIS — D631 Anemia in chronic kidney disease: Secondary | ICD-10-CM | POA: Diagnosis not present

## 2021-02-04 DIAGNOSIS — I462 Cardiac arrest due to underlying cardiac condition: Secondary | ICD-10-CM | POA: Diagnosis not present

## 2021-02-04 DIAGNOSIS — N186 End stage renal disease: Secondary | ICD-10-CM | POA: Diagnosis not present

## 2021-02-04 DIAGNOSIS — R4182 Altered mental status, unspecified: Secondary | ICD-10-CM | POA: Diagnosis not present

## 2021-02-04 DIAGNOSIS — I959 Hypotension, unspecified: Secondary | ICD-10-CM | POA: Diagnosis not present

## 2021-02-04 DIAGNOSIS — I469 Cardiac arrest, cause unspecified: Secondary | ICD-10-CM | POA: Diagnosis not present

## 2021-02-04 DIAGNOSIS — D649 Anemia, unspecified: Secondary | ICD-10-CM | POA: Diagnosis not present

## 2021-02-04 DIAGNOSIS — G931 Anoxic brain damage, not elsewhere classified: Secondary | ICD-10-CM | POA: Diagnosis not present

## 2021-02-04 DIAGNOSIS — I25119 Atherosclerotic heart disease of native coronary artery with unspecified angina pectoris: Secondary | ICD-10-CM | POA: Diagnosis not present

## 2021-02-04 DIAGNOSIS — I2510 Atherosclerotic heart disease of native coronary artery without angina pectoris: Secondary | ICD-10-CM | POA: Diagnosis not present

## 2021-02-04 DIAGNOSIS — I5021 Acute systolic (congestive) heart failure: Secondary | ICD-10-CM | POA: Diagnosis not present

## 2021-02-05 DIAGNOSIS — I959 Hypotension, unspecified: Secondary | ICD-10-CM | POA: Diagnosis not present

## 2021-02-05 DIAGNOSIS — Z992 Dependence on renal dialysis: Secondary | ICD-10-CM | POA: Diagnosis not present

## 2021-02-05 DIAGNOSIS — N186 End stage renal disease: Secondary | ICD-10-CM | POA: Diagnosis not present

## 2021-02-05 DIAGNOSIS — R4182 Altered mental status, unspecified: Secondary | ICD-10-CM | POA: Diagnosis not present

## 2021-02-05 DIAGNOSIS — I462 Cardiac arrest due to underlying cardiac condition: Secondary | ICD-10-CM | POA: Diagnosis not present

## 2021-02-05 DIAGNOSIS — I2510 Atherosclerotic heart disease of native coronary artery without angina pectoris: Secondary | ICD-10-CM | POA: Diagnosis not present

## 2021-02-05 DIAGNOSIS — D631 Anemia in chronic kidney disease: Secondary | ICD-10-CM | POA: Diagnosis not present

## 2021-02-05 DIAGNOSIS — R570 Cardiogenic shock: Secondary | ICD-10-CM | POA: Diagnosis not present

## 2021-02-05 DIAGNOSIS — E872 Acidosis: Secondary | ICD-10-CM | POA: Diagnosis not present

## 2021-02-05 DIAGNOSIS — D649 Anemia, unspecified: Secondary | ICD-10-CM | POA: Diagnosis not present

## 2021-02-05 DIAGNOSIS — I5021 Acute systolic (congestive) heart failure: Secondary | ICD-10-CM | POA: Diagnosis not present

## 2021-02-05 DIAGNOSIS — G931 Anoxic brain damage, not elsewhere classified: Secondary | ICD-10-CM | POA: Diagnosis not present

## 2021-02-05 DIAGNOSIS — I25119 Atherosclerotic heart disease of native coronary artery with unspecified angina pectoris: Secondary | ICD-10-CM | POA: Diagnosis not present

## 2021-02-05 DIAGNOSIS — J90 Pleural effusion, not elsewhere classified: Secondary | ICD-10-CM | POA: Diagnosis not present

## 2021-02-05 DIAGNOSIS — I469 Cardiac arrest, cause unspecified: Secondary | ICD-10-CM | POA: Diagnosis not present

## 2021-02-06 DIAGNOSIS — R52 Pain, unspecified: Secondary | ICD-10-CM | POA: Diagnosis not present

## 2021-02-06 DIAGNOSIS — R4182 Altered mental status, unspecified: Secondary | ICD-10-CM | POA: Diagnosis not present

## 2021-02-06 DIAGNOSIS — D631 Anemia in chronic kidney disease: Secondary | ICD-10-CM | POA: Diagnosis not present

## 2021-02-06 DIAGNOSIS — I462 Cardiac arrest due to underlying cardiac condition: Secondary | ICD-10-CM | POA: Diagnosis not present

## 2021-02-06 DIAGNOSIS — E872 Acidosis: Secondary | ICD-10-CM | POA: Diagnosis not present

## 2021-02-06 DIAGNOSIS — Z515 Encounter for palliative care: Secondary | ICD-10-CM | POA: Diagnosis not present

## 2021-02-06 DIAGNOSIS — I25119 Atherosclerotic heart disease of native coronary artery with unspecified angina pectoris: Secondary | ICD-10-CM | POA: Diagnosis not present

## 2021-02-06 DIAGNOSIS — D649 Anemia, unspecified: Secondary | ICD-10-CM | POA: Diagnosis not present

## 2021-02-06 DIAGNOSIS — N19 Unspecified kidney failure: Secondary | ICD-10-CM | POA: Diagnosis not present

## 2021-02-06 DIAGNOSIS — I2510 Atherosclerotic heart disease of native coronary artery without angina pectoris: Secondary | ICD-10-CM | POA: Diagnosis not present

## 2021-02-06 DIAGNOSIS — N186 End stage renal disease: Secondary | ICD-10-CM | POA: Diagnosis not present

## 2021-02-06 DIAGNOSIS — Z992 Dependence on renal dialysis: Secondary | ICD-10-CM | POA: Diagnosis not present

## 2021-02-06 DIAGNOSIS — I469 Cardiac arrest, cause unspecified: Secondary | ICD-10-CM | POA: Diagnosis not present

## 2021-02-06 DIAGNOSIS — I959 Hypotension, unspecified: Secondary | ICD-10-CM | POA: Diagnosis not present

## 2021-02-06 DIAGNOSIS — J9690 Respiratory failure, unspecified, unspecified whether with hypoxia or hypercapnia: Secondary | ICD-10-CM | POA: Diagnosis not present

## 2021-02-06 DIAGNOSIS — G931 Anoxic brain damage, not elsewhere classified: Secondary | ICD-10-CM | POA: Diagnosis not present

## 2021-02-06 DIAGNOSIS — R570 Cardiogenic shock: Secondary | ICD-10-CM | POA: Diagnosis not present

## 2021-02-06 DIAGNOSIS — I5021 Acute systolic (congestive) heart failure: Secondary | ICD-10-CM | POA: Diagnosis not present

## 2021-02-07 DIAGNOSIS — Z992 Dependence on renal dialysis: Secondary | ICD-10-CM | POA: Diagnosis not present

## 2021-02-07 DIAGNOSIS — I469 Cardiac arrest, cause unspecified: Secondary | ICD-10-CM | POA: Diagnosis not present

## 2021-02-07 DIAGNOSIS — N186 End stage renal disease: Secondary | ICD-10-CM | POA: Diagnosis not present

## 2021-02-07 DIAGNOSIS — N19 Unspecified kidney failure: Secondary | ICD-10-CM | POA: Diagnosis not present

## 2021-02-07 DIAGNOSIS — R4182 Altered mental status, unspecified: Secondary | ICD-10-CM | POA: Diagnosis not present

## 2021-02-07 DIAGNOSIS — D631 Anemia in chronic kidney disease: Secondary | ICD-10-CM | POA: Diagnosis not present

## 2021-02-07 DIAGNOSIS — E872 Acidosis: Secondary | ICD-10-CM | POA: Diagnosis not present

## 2021-02-07 DIAGNOSIS — D649 Anemia, unspecified: Secondary | ICD-10-CM | POA: Diagnosis not present

## 2021-02-07 DIAGNOSIS — I959 Hypotension, unspecified: Secondary | ICD-10-CM | POA: Diagnosis not present

## 2021-02-07 DIAGNOSIS — I2510 Atherosclerotic heart disease of native coronary artery without angina pectoris: Secondary | ICD-10-CM | POA: Diagnosis not present

## 2021-02-08 DIAGNOSIS — I959 Hypotension, unspecified: Secondary | ICD-10-CM | POA: Diagnosis not present

## 2021-02-08 DIAGNOSIS — E872 Acidosis: Secondary | ICD-10-CM | POA: Diagnosis not present

## 2021-02-08 DIAGNOSIS — G931 Anoxic brain damage, not elsewhere classified: Secondary | ICD-10-CM | POA: Diagnosis not present

## 2021-02-08 DIAGNOSIS — I462 Cardiac arrest due to underlying cardiac condition: Secondary | ICD-10-CM | POA: Diagnosis not present

## 2021-02-08 DIAGNOSIS — J9690 Respiratory failure, unspecified, unspecified whether with hypoxia or hypercapnia: Secondary | ICD-10-CM | POA: Diagnosis not present

## 2021-02-08 DIAGNOSIS — D631 Anemia in chronic kidney disease: Secondary | ICD-10-CM | POA: Diagnosis not present

## 2021-02-08 DIAGNOSIS — N19 Unspecified kidney failure: Secondary | ICD-10-CM | POA: Diagnosis not present

## 2021-02-08 DIAGNOSIS — I2510 Atherosclerotic heart disease of native coronary artery without angina pectoris: Secondary | ICD-10-CM | POA: Diagnosis not present

## 2021-02-08 DIAGNOSIS — Z992 Dependence on renal dialysis: Secondary | ICD-10-CM | POA: Diagnosis not present

## 2021-02-08 DIAGNOSIS — I25119 Atherosclerotic heart disease of native coronary artery with unspecified angina pectoris: Secondary | ICD-10-CM | POA: Diagnosis not present

## 2021-02-08 DIAGNOSIS — R4182 Altered mental status, unspecified: Secondary | ICD-10-CM | POA: Diagnosis not present

## 2021-02-08 DIAGNOSIS — Z515 Encounter for palliative care: Secondary | ICD-10-CM | POA: Diagnosis not present

## 2021-02-08 DIAGNOSIS — D649 Anemia, unspecified: Secondary | ICD-10-CM | POA: Diagnosis not present

## 2021-02-08 DIAGNOSIS — I5021 Acute systolic (congestive) heart failure: Secondary | ICD-10-CM | POA: Diagnosis not present

## 2021-02-08 DIAGNOSIS — R52 Pain, unspecified: Secondary | ICD-10-CM | POA: Diagnosis not present

## 2021-02-08 DIAGNOSIS — I469 Cardiac arrest, cause unspecified: Secondary | ICD-10-CM | POA: Diagnosis not present

## 2021-02-08 DIAGNOSIS — N186 End stage renal disease: Secondary | ICD-10-CM | POA: Diagnosis not present

## 2021-02-09 DIAGNOSIS — I469 Cardiac arrest, cause unspecified: Secondary | ICD-10-CM | POA: Diagnosis not present

## 2021-02-09 DIAGNOSIS — I25119 Atherosclerotic heart disease of native coronary artery with unspecified angina pectoris: Secondary | ICD-10-CM | POA: Diagnosis not present

## 2021-02-09 DIAGNOSIS — I2510 Atherosclerotic heart disease of native coronary artery without angina pectoris: Secondary | ICD-10-CM | POA: Diagnosis not present

## 2021-02-09 DIAGNOSIS — R4182 Altered mental status, unspecified: Secondary | ICD-10-CM | POA: Diagnosis not present

## 2021-02-09 DIAGNOSIS — I5021 Acute systolic (congestive) heart failure: Secondary | ICD-10-CM | POA: Diagnosis not present

## 2021-02-09 DIAGNOSIS — D649 Anemia, unspecified: Secondary | ICD-10-CM | POA: Diagnosis not present

## 2021-02-09 DIAGNOSIS — D631 Anemia in chronic kidney disease: Secondary | ICD-10-CM | POA: Diagnosis not present

## 2021-02-09 DIAGNOSIS — N186 End stage renal disease: Secondary | ICD-10-CM | POA: Diagnosis not present

## 2021-02-09 DIAGNOSIS — N19 Unspecified kidney failure: Secondary | ICD-10-CM | POA: Diagnosis not present

## 2021-02-09 DIAGNOSIS — E872 Acidosis: Secondary | ICD-10-CM | POA: Diagnosis not present

## 2021-02-09 DIAGNOSIS — I959 Hypotension, unspecified: Secondary | ICD-10-CM | POA: Diagnosis not present

## 2021-02-09 DIAGNOSIS — Z992 Dependence on renal dialysis: Secondary | ICD-10-CM | POA: Diagnosis not present

## 2021-02-09 DIAGNOSIS — I462 Cardiac arrest due to underlying cardiac condition: Secondary | ICD-10-CM | POA: Diagnosis not present

## 2021-02-09 DIAGNOSIS — G931 Anoxic brain damage, not elsewhere classified: Secondary | ICD-10-CM | POA: Diagnosis not present

## 2021-02-10 DIAGNOSIS — R4182 Altered mental status, unspecified: Secondary | ICD-10-CM | POA: Diagnosis not present

## 2021-02-10 DIAGNOSIS — I2510 Atherosclerotic heart disease of native coronary artery without angina pectoris: Secondary | ICD-10-CM | POA: Diagnosis not present

## 2021-02-10 DIAGNOSIS — N186 End stage renal disease: Secondary | ICD-10-CM | POA: Diagnosis not present

## 2021-02-10 DIAGNOSIS — I469 Cardiac arrest, cause unspecified: Secondary | ICD-10-CM | POA: Diagnosis not present

## 2021-02-10 DIAGNOSIS — D631 Anemia in chronic kidney disease: Secondary | ICD-10-CM | POA: Diagnosis not present

## 2021-02-10 DIAGNOSIS — I959 Hypotension, unspecified: Secondary | ICD-10-CM | POA: Diagnosis not present

## 2021-02-10 DIAGNOSIS — N19 Unspecified kidney failure: Secondary | ICD-10-CM | POA: Diagnosis not present

## 2021-02-10 DIAGNOSIS — Z992 Dependence on renal dialysis: Secondary | ICD-10-CM | POA: Diagnosis not present

## 2021-02-10 DIAGNOSIS — D649 Anemia, unspecified: Secondary | ICD-10-CM | POA: Diagnosis not present

## 2023-03-22 ENCOUNTER — Other Ambulatory Visit (HOSPITAL_COMMUNITY): Payer: Self-pay

## 2024-02-16 ENCOUNTER — Other Ambulatory Visit (HOSPITAL_COMMUNITY): Payer: Self-pay
